# Patient Record
Sex: Female | Born: 1960
Health system: Southern US, Community
[De-identification: ages and names within clinical notes are randomized; demographics above are authoritative.]

## PROBLEM LIST (undated history)

## (undated) DIAGNOSIS — R079 Chest pain, unspecified: Secondary | ICD-10-CM

## (undated) DIAGNOSIS — I2489 Other forms of acute ischemic heart disease: Secondary | ICD-10-CM

## (undated) DIAGNOSIS — T8149XA Infection following a procedure, other surgical site, initial encounter: Secondary | ICD-10-CM

## (undated) DIAGNOSIS — E1165 Type 2 diabetes mellitus with hyperglycemia: Secondary | ICD-10-CM

## (undated) DIAGNOSIS — I209 Angina pectoris, unspecified: Secondary | ICD-10-CM

## (undated) DIAGNOSIS — F17200 Nicotine dependence, unspecified, uncomplicated: Secondary | ICD-10-CM

## (undated) DIAGNOSIS — R000 Tachycardia, unspecified: Secondary | ICD-10-CM

## (undated) DIAGNOSIS — I10 Essential (primary) hypertension: Secondary | ICD-10-CM

## (undated) DIAGNOSIS — E875 Hyperkalemia: Secondary | ICD-10-CM

## (undated) DIAGNOSIS — I219 Acute myocardial infarction, unspecified: Secondary | ICD-10-CM

## (undated) DIAGNOSIS — Z8614 Personal history of Methicillin resistant Staphylococcus aureus infection: Secondary | ICD-10-CM

## (undated) DIAGNOSIS — I739 Peripheral vascular disease, unspecified: Secondary | ICD-10-CM

## (undated) DIAGNOSIS — I248 Other forms of acute ischemic heart disease: Secondary | ICD-10-CM

## (undated) DIAGNOSIS — Z95810 Presence of automatic (implantable) cardiac defibrillator: Secondary | ICD-10-CM

## (undated) DIAGNOSIS — E785 Hyperlipidemia, unspecified: Secondary | ICD-10-CM

## (undated) DIAGNOSIS — Z951 Presence of aortocoronary bypass graft: Secondary | ICD-10-CM

## (undated) DIAGNOSIS — R6520 Severe sepsis without septic shock: Secondary | ICD-10-CM

## (undated) DIAGNOSIS — C801 Malignant (primary) neoplasm, unspecified: Secondary | ICD-10-CM

## (undated) DIAGNOSIS — A419 Sepsis, unspecified organism: Secondary | ICD-10-CM

## (undated) DIAGNOSIS — J439 Emphysema, unspecified: Secondary | ICD-10-CM

## (undated) DIAGNOSIS — R142 Eructation: Secondary | ICD-10-CM

## (undated) DIAGNOSIS — J9851 Mediastinitis: Secondary | ICD-10-CM

## (undated) DIAGNOSIS — I5032 Chronic diastolic (congestive) heart failure: Secondary | ICD-10-CM

## (undated) DIAGNOSIS — M21172 Varus deformity, not elsewhere classified, left ankle: Secondary | ICD-10-CM

## (undated) DIAGNOSIS — I25119 Atherosclerotic heart disease of native coronary artery with unspecified angina pectoris: Secondary | ICD-10-CM

## (undated) DIAGNOSIS — K219 Gastro-esophageal reflux disease without esophagitis: Secondary | ICD-10-CM

## (undated) HISTORY — DX: Severe sepsis without septic shock: R65.20

## (undated) HISTORY — DX: Nicotine dependence, unspecified, uncomplicated: F17.200

## (undated) HISTORY — DX: Morbid (severe) obesity due to excess calories: E66.01

## (undated) HISTORY — DX: Chronic diastolic (congestive) heart failure: I50.32

## (undated) HISTORY — PX: TUBAL LIGATION: SHX77

## (undated) HISTORY — PX: BLADDER SURGERY: SHX569

## (undated) HISTORY — DX: Mediastinitis: J98.51

## (undated) HISTORY — DX: Hyperlipidemia, unspecified: E78.5

## (undated) HISTORY — DX: Type 2 diabetes mellitus with hyperglycemia: E11.65

## (undated) HISTORY — DX: Other forms of acute ischemic heart disease: I24.8

## (undated) HISTORY — DX: Presence of aortocoronary bypass graft: Z95.1

## (undated) HISTORY — DX: Sepsis, unspecified organism: A41.9

## (undated) HISTORY — PX: CORONARY ARTERY BYPASS GRAFT: SHX141

## (undated) HISTORY — DX: Chest pain, unspecified: R07.9

## (undated) HISTORY — DX: Other forms of acute ischemic heart disease: I24.89

## (undated) HISTORY — DX: Tachycardia, unspecified: R00.0

## (undated) HISTORY — DX: Atherosclerotic heart disease of native coronary artery with unspecified angina pectoris: I25.119

## (undated) HISTORY — DX: Gastro-esophageal reflux disease without esophagitis: K21.9

## (undated) HISTORY — PX: CARDIAC CATHETERIZATION: SHX172

## (undated) HISTORY — DX: Emphysema, unspecified: J43.9

## (undated) HISTORY — DX: Eructation: R14.2

## (undated) HISTORY — DX: Essential (primary) hypertension: I10

## (undated) HISTORY — DX: Infection following a procedure, other surgical site, initial encounter: T81.49XA

## (undated) HISTORY — DX: Angina pectoris, unspecified: I20.9

---

## 2006-02-19 ENCOUNTER — Inpatient Hospital Stay (HOSPITAL_COMMUNITY): Admission: EM | Admit: 2006-02-19 | Discharge: 2006-02-21 | Payer: Self-pay | Admitting: Cardiology

## 2006-02-19 ENCOUNTER — Ambulatory Visit: Payer: Self-pay | Admitting: Cardiology

## 2015-09-12 DIAGNOSIS — I25119 Atherosclerotic heart disease of native coronary artery with unspecified angina pectoris: Secondary | ICD-10-CM | POA: Insufficient documentation

## 2015-09-12 DIAGNOSIS — E785 Hyperlipidemia, unspecified: Secondary | ICD-10-CM

## 2015-09-12 DIAGNOSIS — I10 Essential (primary) hypertension: Secondary | ICD-10-CM | POA: Insufficient documentation

## 2015-09-12 HISTORY — DX: Hyperlipidemia, unspecified: E78.5

## 2015-09-12 HISTORY — DX: Atherosclerotic heart disease of native coronary artery with unspecified angina pectoris: I25.119

## 2015-09-12 HISTORY — DX: Essential (primary) hypertension: I10

## 2015-12-06 LAB — HM COLONOSCOPY

## 2016-04-20 DIAGNOSIS — F17200 Nicotine dependence, unspecified, uncomplicated: Secondary | ICD-10-CM | POA: Insufficient documentation

## 2016-04-20 HISTORY — DX: Nicotine dependence, unspecified, uncomplicated: F17.200

## 2016-05-08 DIAGNOSIS — R142 Eructation: Secondary | ICD-10-CM | POA: Insufficient documentation

## 2016-05-08 DIAGNOSIS — L97509 Non-pressure chronic ulcer of other part of unspecified foot with unspecified severity: Secondary | ICD-10-CM | POA: Insufficient documentation

## 2016-05-08 DIAGNOSIS — I739 Peripheral vascular disease, unspecified: Secondary | ICD-10-CM | POA: Insufficient documentation

## 2016-05-08 DIAGNOSIS — K219 Gastro-esophageal reflux disease without esophagitis: Secondary | ICD-10-CM

## 2016-05-08 DIAGNOSIS — R079 Chest pain, unspecified: Secondary | ICD-10-CM

## 2016-05-08 DIAGNOSIS — E11621 Type 2 diabetes mellitus with foot ulcer: Secondary | ICD-10-CM | POA: Insufficient documentation

## 2016-05-08 DIAGNOSIS — R000 Tachycardia, unspecified: Secondary | ICD-10-CM | POA: Insufficient documentation

## 2016-05-08 DIAGNOSIS — E1165 Type 2 diabetes mellitus with hyperglycemia: Secondary | ICD-10-CM

## 2016-05-08 DIAGNOSIS — IMO0002 Reserved for concepts with insufficient information to code with codable children: Secondary | ICD-10-CM

## 2016-05-08 HISTORY — DX: Tachycardia, unspecified: R00.0

## 2016-05-08 HISTORY — DX: Type 2 diabetes mellitus with foot ulcer: E11.621

## 2016-05-08 HISTORY — DX: Type 2 diabetes mellitus with hyperglycemia: E11.65

## 2016-05-08 HISTORY — DX: Morbid (severe) obesity due to excess calories: E66.01

## 2016-05-08 HISTORY — DX: Gastro-esophageal reflux disease without esophagitis: K21.9

## 2016-05-08 HISTORY — DX: Eructation: R14.2

## 2016-05-08 HISTORY — DX: Reserved for concepts with insufficient information to code with codable children: IMO0002

## 2016-05-08 HISTORY — DX: Chest pain, unspecified: R07.9

## 2016-06-03 DIAGNOSIS — E1110 Type 2 diabetes mellitus with ketoacidosis without coma: Secondary | ICD-10-CM | POA: Insufficient documentation

## 2016-06-03 DIAGNOSIS — A419 Sepsis, unspecified organism: Secondary | ICD-10-CM

## 2016-06-03 DIAGNOSIS — Z951 Presence of aortocoronary bypass graft: Secondary | ICD-10-CM | POA: Insufficient documentation

## 2016-06-03 DIAGNOSIS — R6520 Severe sepsis without septic shock: Secondary | ICD-10-CM

## 2016-06-03 HISTORY — DX: Presence of aortocoronary bypass graft: Z95.1

## 2016-06-03 HISTORY — DX: Sepsis, unspecified organism: R65.20

## 2016-06-03 HISTORY — DX: Severe sepsis without septic shock: A41.9

## 2016-06-06 DIAGNOSIS — T8149XA Infection following a procedure, other surgical site, initial encounter: Secondary | ICD-10-CM | POA: Insufficient documentation

## 2016-06-06 HISTORY — DX: Infection following a procedure, other surgical site, initial encounter: T81.49XA

## 2016-06-28 DIAGNOSIS — J9851 Mediastinitis: Secondary | ICD-10-CM

## 2016-06-28 HISTORY — DX: Mediastinitis: J98.51

## 2016-11-06 DIAGNOSIS — I5032 Chronic diastolic (congestive) heart failure: Secondary | ICD-10-CM

## 2016-11-06 HISTORY — DX: Chronic diastolic (congestive) heart failure: I50.32

## 2016-12-24 DIAGNOSIS — M5186 Other intervertebral disc disorders, lumbar region: Secondary | ICD-10-CM | POA: Diagnosis not present

## 2016-12-24 DIAGNOSIS — M25551 Pain in right hip: Secondary | ICD-10-CM | POA: Diagnosis not present

## 2016-12-24 DIAGNOSIS — M47816 Spondylosis without myelopathy or radiculopathy, lumbar region: Secondary | ICD-10-CM | POA: Diagnosis not present

## 2017-02-13 DIAGNOSIS — I5032 Chronic diastolic (congestive) heart failure: Secondary | ICD-10-CM | POA: Diagnosis not present

## 2017-02-13 DIAGNOSIS — I25119 Atherosclerotic heart disease of native coronary artery with unspecified angina pectoris: Secondary | ICD-10-CM | POA: Diagnosis not present

## 2017-02-13 DIAGNOSIS — Z951 Presence of aortocoronary bypass graft: Secondary | ICD-10-CM | POA: Diagnosis not present

## 2017-05-07 DIAGNOSIS — Z8619 Personal history of other infectious and parasitic diseases: Secondary | ICD-10-CM | POA: Diagnosis not present

## 2017-05-07 DIAGNOSIS — Z09 Encounter for follow-up examination after completed treatment for conditions other than malignant neoplasm: Secondary | ICD-10-CM | POA: Diagnosis not present

## 2017-05-07 DIAGNOSIS — T8132XD Disruption of internal operation (surgical) wound, not elsewhere classified, subsequent encounter: Secondary | ICD-10-CM | POA: Diagnosis not present

## 2017-05-07 DIAGNOSIS — R918 Other nonspecific abnormal finding of lung field: Secondary | ICD-10-CM | POA: Diagnosis not present

## 2017-05-07 DIAGNOSIS — Z9889 Other specified postprocedural states: Secondary | ICD-10-CM | POA: Diagnosis not present

## 2017-09-11 DIAGNOSIS — I209 Angina pectoris, unspecified: Secondary | ICD-10-CM | POA: Insufficient documentation

## 2017-09-11 DIAGNOSIS — J439 Emphysema, unspecified: Secondary | ICD-10-CM

## 2017-09-11 HISTORY — DX: Emphysema, unspecified: J43.9

## 2017-09-11 HISTORY — DX: Angina pectoris, unspecified: I20.9

## 2017-09-16 ENCOUNTER — Ambulatory Visit (INDEPENDENT_AMBULATORY_CARE_PROVIDER_SITE_OTHER): Payer: Commercial Managed Care - PPO | Admitting: Cardiology

## 2017-09-16 ENCOUNTER — Encounter: Payer: Self-pay | Admitting: Cardiology

## 2017-09-16 VITALS — BP 120/70 | HR 82 | Ht 67.0 in | Wt 260.0 lb

## 2017-09-16 DIAGNOSIS — I25119 Atherosclerotic heart disease of native coronary artery with unspecified angina pectoris: Secondary | ICD-10-CM | POA: Diagnosis not present

## 2017-09-16 DIAGNOSIS — I10 Essential (primary) hypertension: Secondary | ICD-10-CM

## 2017-09-16 DIAGNOSIS — E785 Hyperlipidemia, unspecified: Secondary | ICD-10-CM

## 2017-09-16 DIAGNOSIS — Z122 Encounter for screening for malignant neoplasm of respiratory organs: Secondary | ICD-10-CM | POA: Diagnosis not present

## 2017-09-16 DIAGNOSIS — I5032 Chronic diastolic (congestive) heart failure: Secondary | ICD-10-CM | POA: Diagnosis not present

## 2017-09-16 NOTE — Progress Notes (Signed)
Cardiology Office Note:    Date:  09/16/2017   ID:  Jasmine Richardson, DOB Jul 14, 1961, MRN [id number]  PCP:  Ernestene Kiel, MD  Cardiologist:  Shirlee More, MD    Referring MD: No ref. provider found    ASSESSMENT:    1. Coronary artery disease involving native coronary artery of native heart with angina pectoris ([place])   2. Essential hypertension   3. Chronic diastolic heart failure ([place])   4. Hyperlipidemia, unspecified hyperlipidemia type    PLAN:    In order of problems listed above:  1. Stable continue medical treatment her shortness of breath is due to a combination of heart failure and restrictive lung disease from reoperation and failure sternal union. She tells me she is under a 30 pound weight restriction from  Cardiac surgery. 2. Stable continue current medical treatment 3. Compensated continue her current diuretic, she has no edema 4. Stable continue high intensity statin and access liver and lipids requested from her PCP  I ordered a CT scan screening for lung cancer as recommended by the cardiothoracic surgeon Next appointment: 6 months   Medication Adjustments/Labs and Tests Ordered: Current medicines are reviewed at length with the patient today.  Concerns regarding medicines are outlined above.  No orders of the defined types were placed in this encounter.  No orders of the defined types were placed in this encounter.   Chief Complaint  Patient presents with  . Follow-up    6 month flup appt   . Shortness of Breath    History of Present Illness:    Jasmine Richardson is a 56 y.o. female with a hx of CAD, S/P CABG 04/23/16 with post operative mediastinitis and reoperation at [facility] with a myocutaneous pectoral flaps for an infected sternotomy after CABG , diastolic heart failure, T2 DM, hypertension and hyperlipidemia last seen 02/13/17. Her last CTS note referenced:Follow up lung cancer imaging based on 2013 CHEST guidelines: CT chest annually He has never  fully recovered from bypass and continues to have intermittent variable shortness of breath at times with a relatively minor activities related to her chest wall abnormality. She also complains of  and sharp chest pain with a deep breath and has had no anginaedema palpitation or syncope. Compliance with diet, lifestyle and medications: yes Past Medical History:  Diagnosis Date  . Acute chest pain 05/08/2016  . Angina pectoris ([place]) 09/11/2017  . Burping 05/08/2016  . Chronic diastolic heart failure ([place]) 11/06/2016  . Coronary artery disease involving native coronary artery of native heart with angina pectoris ([place]) 09/12/2015   Overview:  PCI and stent of RCA 2009, last cath 2012 with medical therapy  CABG May 2017  . Emphysema lung ([place]) 09/11/2017  . Essential hypertension 09/12/2015  . GERD (gastroesophageal reflux disease) 05/08/2016  . Hyperlipidemia 09/12/2015  . Mediastinitis 06/28/2016  . Morbid obesity ([place]) 05/08/2016  . S/P CABG (coronary artery bypass graft) 06/03/2016   Overview:  The patient underwent sternal reconstruction on 06/21/16 with pec flaps for mediastinitis from a prior CABG in May 2017. On admission, she was critically ill from sepsis and had altered mental status. She was last seen in clinic on 08/09/16 at which time she was doing well.  . Severe sepsis ([place]) 06/03/2016  . Sinus tachycardia 05/08/2016  . Tobacco use disorder 04/20/2016   Overview:  Quit in May 2017.  [name] Uncontrolled type 2 diabetes mellitus ([place]) 05/08/2016  . Wound, surgical, infected 06/06/2016   Overview:  sternal  Past Surgical History:  Procedure Laterality Date  . BLADDER SURGERY    . CARDIAC CATHETERIZATION    . CORONARY ARTERY BYPASS GRAFT    . TUBAL LIGATION      Current Medications: Current Meds  Medication Sig  . aspirin 81 MG chewable tablet Chew 81 mg by mouth daily.  [name] atorvastatin (LIPITOR) 80 MG tablet Take 80 mg by mouth daily.  . Cyanocobalamin (VITAMIN B 12 PO) Take 1 tablet by mouth  daily.  [name] glipiZIDE (GLUCOTROL) 10 MG tablet Take 10 mg by mouth 2 (two) times daily.  . insulin regular (NOVOLIN R,HUMULIN R) 100 units/mL injection Inject into the skin. Sliding scale-only uses as needed  . LORazepam (ATIVAN) 1 MG tablet Take 1 mg by mouth daily as needed for anxiety.  . metFORMIN (GLUCOPHAGE) 1000 MG tablet Take 1,000 mg by mouth 2 (two) times daily.  . metoprolol succinate (TOPROL-XL) 50 MG 24 hr tablet Take 1 tablet by mouth 3 (three) times daily.  . nitroGLYCERIN (NITROSTAT) 0.4 MG SL tablet Place 0.4 mg under the tongue every 5 (five) minutes x 3 doses as needed for chest pain.  [name] omeprazole (PRILOSEC) 20 MG capsule Take 20 mg by mouth daily.  . ramipril (ALTACE) 1.25 MG capsule Take 1.25 mg by mouth daily.  . ranitidine (ZANTAC) 300 MG tablet Take 300 mg by mouth at bedtime.  . Vitamin D, Ergocalciferol, (DRISDOL) 50000 units CAPS capsule Take 50,000 Units by mouth every 7 (seven) days.     Allergies:   Codeine   Social History   Social History  . Marital status: Married    Spouse name: N/A  . Number of children: N/A  . Years of education: N/A   Social History Main Topics  . Smoking status: Former Research scientist (life sciences)  . Smokeless tobacco: Never Used  . Alcohol use No  . Drug use: No  . Sexual activity: Not Asked   Other Topics Concern  . None   Social History Narrative  . None     Family History: The patient's family history includes Alzheimer's disease in her father; Heart attack in her father; Heart disease in her father; Hyperlipidemia in her brother and mother; Hypertension in her brother, father, and mother. ROS:   Please see the history of present illness.    All other systems reviewed and are negative.  EKGs/Labs/Other Studies Reviewed:    The following studies were reviewed today:  EKG:  EKG ordered today.  The ekg ordered today demonstrates sinus rhythm old inferior septal MI  Recent Labs: No results found for requested labs within last 8760  hours.  Recent Lipid Panel No results found for: CHOL, TRIG, HDL, CHOLHDL, VLDL, LDLCALC, LDLDIRECT  Physical Exam:    VS:  BP 120/70 (BP Location: Right Arm, Patient Position: Sitting)   Pulse 82   Ht 5\' 7"  (1.702 m)   Wt 260 lb (117.9 kg)   SpO2 97%   BMI 40.72 kg/m     Wt Readings from Last 3 Encounters:  09/16/17 260 lb (117.9 kg)     GEN:  Well nourished, well developed in no acute distress HEENT: Normal NECK: No JVD; No carotid bruits LYMPHATICS: No lymphadenopathy CARDIAC: sternal click, RRR, no murmurs, rubs, gallops RESPIRATORY:  Clear to auscultation without rales, wheezing or rhonchi  ABDOMEN: Soft, non-tender, non-distended MUSCULOSKELETAL:  No edema; No deformity  SKIN: Warm and dry NEUROLOGIC:  Alert and oriented x 3 PSYCHIATRIC:  Normal affect    Signed, Shirlee More, MD  09/16/2017 2:51 PM    [facility]

## 2017-09-16 NOTE — Patient Instructions (Signed)
Medication Instructions:  Your physician recommends that you continue on your current medications as directed. Please refer to the Current Medication list given to you today.   Labwork: None  Testing/Procedures: You had an EKG today  Non-Cardiac CT scanning, (CAT scanning), is a noninvasive, special x-ray that produces cross-sectional images of the body using x-rays and a computer. CT scans help physicians diagnose and treat medical conditions. For some CT exams, a contrast material is used to enhance visibility in the area of the body being studied. CT scans provide greater clarity and reveal more details than regular x-ray exams.    Follow-Up: Your physician recommends that you schedule a follow-up appointment in: 6 months   Any Other Special Instructions Will Be Listed Below (If Applicable).     If you need a refill on your cardiac medications before your next appointment, please call your pharmacy.

## 2017-09-19 DIAGNOSIS — J181 Lobar pneumonia, unspecified organism: Secondary | ICD-10-CM | POA: Diagnosis not present

## 2017-09-20 ENCOUNTER — Telehealth: Payer: Self-pay

## 2017-09-20 NOTE — Telephone Encounter (Signed)
Pt notified of CT results per Dr [name].  Pt verbalized understanding,

## 2017-09-20 NOTE — Telephone Encounter (Signed)
-----   Message from Richardo Priest, MD sent at 09/19/2017  3:31 PM EDT ----- CT is good

## 2017-09-20 NOTE — Telephone Encounter (Signed)
Left message on pts home phone and cell phone to return call for CT results.

## 2017-09-20 NOTE — Telephone Encounter (Signed)
Patient returned your call.cn 

## 2017-09-27 DIAGNOSIS — E785 Hyperlipidemia, unspecified: Secondary | ICD-10-CM | POA: Diagnosis not present

## 2017-09-27 DIAGNOSIS — I10 Essential (primary) hypertension: Secondary | ICD-10-CM | POA: Diagnosis not present

## 2017-09-27 DIAGNOSIS — E1165 Type 2 diabetes mellitus with hyperglycemia: Secondary | ICD-10-CM | POA: Diagnosis not present

## 2017-09-27 DIAGNOSIS — Z79899 Other long term (current) drug therapy: Secondary | ICD-10-CM | POA: Diagnosis not present

## 2017-10-21 DIAGNOSIS — H1044 Vernal conjunctivitis: Secondary | ICD-10-CM | POA: Diagnosis not present

## 2017-10-21 DIAGNOSIS — H1032 Unspecified acute conjunctivitis, left eye: Secondary | ICD-10-CM | POA: Diagnosis not present

## 2017-12-03 DIAGNOSIS — H3563 Retinal hemorrhage, bilateral: Secondary | ICD-10-CM | POA: Diagnosis not present

## 2017-12-03 DIAGNOSIS — Z7984 Long term (current) use of oral hypoglycemic drugs: Secondary | ICD-10-CM | POA: Diagnosis not present

## 2017-12-03 DIAGNOSIS — E113391 Type 2 diabetes mellitus with moderate nonproliferative diabetic retinopathy without macular edema, right eye: Secondary | ICD-10-CM | POA: Diagnosis not present

## 2017-12-30 ENCOUNTER — Other Ambulatory Visit: Payer: Self-pay

## 2017-12-30 MED ORDER — ATORVASTATIN CALCIUM 80 MG PO TABS: 80.0000 mg | ORAL_TABLET | Freq: Every day | ORAL | 2 refills | Status: DC

## 2018-01-30 DIAGNOSIS — I10 Essential (primary) hypertension: Secondary | ICD-10-CM | POA: Diagnosis not present

## 2018-01-30 DIAGNOSIS — E1165 Type 2 diabetes mellitus with hyperglycemia: Secondary | ICD-10-CM | POA: Diagnosis not present

## 2018-01-30 DIAGNOSIS — Z79899 Other long term (current) drug therapy: Secondary | ICD-10-CM | POA: Diagnosis not present

## 2018-01-30 DIAGNOSIS — E785 Hyperlipidemia, unspecified: Secondary | ICD-10-CM | POA: Diagnosis not present

## 2018-01-30 DIAGNOSIS — I2510 Atherosclerotic heart disease of native coronary artery without angina pectoris: Secondary | ICD-10-CM | POA: Diagnosis not present

## 2018-03-26 DIAGNOSIS — H00015 Hordeolum externum left lower eyelid: Secondary | ICD-10-CM | POA: Diagnosis not present

## 2018-05-02 DIAGNOSIS — R809 Proteinuria, unspecified: Secondary | ICD-10-CM | POA: Diagnosis not present

## 2018-05-02 DIAGNOSIS — I10 Essential (primary) hypertension: Secondary | ICD-10-CM | POA: Diagnosis not present

## 2018-05-02 DIAGNOSIS — E1165 Type 2 diabetes mellitus with hyperglycemia: Secondary | ICD-10-CM | POA: Diagnosis not present

## 2018-05-02 DIAGNOSIS — Z79899 Other long term (current) drug therapy: Secondary | ICD-10-CM | POA: Diagnosis not present

## 2018-06-25 ENCOUNTER — Ambulatory Visit: Payer: Commercial Managed Care - PPO | Admitting: Cardiology

## 2018-06-27 ENCOUNTER — Ambulatory Visit: Payer: Commercial Managed Care - PPO | Admitting: Cardiology

## 2018-06-27 ENCOUNTER — Encounter: Payer: Self-pay | Admitting: Cardiology

## 2018-06-27 VITALS — BP 128/76 | HR 87 | Ht 67.0 in | Wt 261.0 lb

## 2018-06-27 DIAGNOSIS — R296 Repeated falls: Secondary | ICD-10-CM

## 2018-06-27 DIAGNOSIS — I5032 Chronic diastolic (congestive) heart failure: Secondary | ICD-10-CM | POA: Diagnosis not present

## 2018-06-27 DIAGNOSIS — E785 Hyperlipidemia, unspecified: Secondary | ICD-10-CM

## 2018-06-27 DIAGNOSIS — I25119 Atherosclerotic heart disease of native coronary artery with unspecified angina pectoris: Secondary | ICD-10-CM

## 2018-06-27 DIAGNOSIS — I209 Angina pectoris, unspecified: Secondary | ICD-10-CM | POA: Diagnosis not present

## 2018-06-27 DIAGNOSIS — I10 Essential (primary) hypertension: Secondary | ICD-10-CM

## 2018-06-27 MED ORDER — FUROSEMIDE 40 MG PO TABS: 40.0000 mg | ORAL_TABLET | Freq: Every day | ORAL | 3 refills | Status: DC

## 2018-06-27 NOTE — Patient Instructions (Signed)
Medication Instructions:  Your physician has recommended you make the following change in your medication:   START: furosemide 40mg  one tablet daily   Labwork: Your physician recommends that you have the following labs drawn on Tuesday: BMP and BNP   Testing/Procedures: NONE  Follow-Up: Your physician recommends that you schedule a follow-up appointment in: 2 weeks   Any Other Special Instructions Will Be Listed Below (If Applicable).     If you need a refill on your cardiac medications before your next appointment, please call your pharmacy.

## 2018-06-27 NOTE — Progress Notes (Signed)
Cardiology Office Note:    Date:  06/27/2018   ID:  Jasmine Richardson, DOB September 07, 1961, MRN [id number]  PCP:  Ernestene Kiel, MD  Cardiologist:  Shirlee More, MD    Referring MD: Ernestene Kiel, MD    ASSESSMENT:    1. Chronic diastolic heart failure ([place])   2. Coronary artery disease involving native coronary artery of native heart with angina pectoris ([place])   3. Angina pectoris ([place])   4. Hyperlipidemia, unspecified hyperlipidemia type   5. Essential hypertension   6. Falls frequently    PLAN:    In order of problems listed above:  1. She is symptomatic fluid overloaded and decompensated heart failure.  Labs requested recently from her PCP office start a loop diuretic can come back Tuesday and check BMP and proBNP and follow-up in 2 weeks.  I will make a decision at that time regarding the need for echocardiogram 2. Stable after bypass surgery continue current medical treatment 3. Stable no clinical recurrence 4. Stable continue her statin await labs for liver function safety lipids efficacy 5. Stable at target no orthostatic shift and blood pressure taking beta-blocker and ACE with her diabetes 6. Unrelated to blood pressure I suspect it is a combination including diabetic neuropathy and asked her to address with her primary care physician   Next appointment: 2 weeks   Medication Adjustments/Labs and Tests Ordered: Current medicines are reviewed at length with the patient today.  Concerns regarding medicines are outlined above.  Orders Placed This Encounter  Procedures  . Basic metabolic panel  . Pro b natriuretic peptide (BNP)  . EKG 12-Lead   Meds ordered this encounter  Medications  . furosemide (LASIX) 40 MG tablet    Sig: Take 1 tablet (40 mg total) by mouth daily.    Dispense:  30 tablet    Refill:  3    No chief complaint on file.   History of Present Illness:    Jasmine Richardson is a 57 y.o. female with a hx of CAD, S/P CABG 04/23/16 with post  operative mediastinitis and reoperation at [facility] with a myocutaneous pectoral flaps for an infected sternotomy after CABG , diastolic heart failure, T2 DM, hypertension and hyperlipidemia   last seen 09/16/17. Compliance with diet, lifestyle and medications: Yes  She is no longer loop diuretic and complains of edema exertional shortness of breath with minor activities and orthopnea. She has had no chest pain palpitation or syncope Her predominant concern is frequent falls she has no orthostatic shift and blood pressure my office today. Recent labs requested from her PCP office renal function hemoglobin and lipids Past Medical History:  Diagnosis Date  . Acute chest pain 05/08/2016  . Angina pectoris ([place]) 09/11/2017  . Burping 05/08/2016  . Chronic diastolic heart failure ([place]) 11/06/2016  . Coronary artery disease involving native coronary artery of native heart with angina pectoris ([place]) 09/12/2015   Overview:  PCI and stent of RCA 2009, last cath 2012 with medical therapy  CABG May 2017  . Emphysema lung ([place]) 09/11/2017  . Essential hypertension 09/12/2015  . GERD (gastroesophageal reflux disease) 05/08/2016  . Hyperlipidemia 09/12/2015  . Mediastinitis 06/28/2016  . Morbid obesity ([place]) 05/08/2016  . S/P CABG (coronary artery bypass graft) 06/03/2016   Overview:  The patient underwent sternal reconstruction on 06/21/16 with pec flaps for mediastinitis from a prior CABG in May 2017. On admission, she was critically ill from sepsis and had altered mental status. She was last seen in clinic  on 08/09/16 at which time she was doing well.  . Severe sepsis ([place]) 06/03/2016  . Sinus tachycardia 05/08/2016  . Tobacco use disorder 04/20/2016   Overview:  Quit in May 2017.  [name] Uncontrolled type 2 diabetes mellitus ([place]) 05/08/2016  . Wound, surgical, infected 06/06/2016   Overview:  sternal    Past Surgical History:  Procedure Laterality Date  . BLADDER SURGERY    . CARDIAC CATHETERIZATION    . CORONARY  ARTERY BYPASS GRAFT    . TUBAL LIGATION      Current Medications: Current Meds  Medication Sig  . aspirin 81 MG chewable tablet Chew 81 mg by mouth daily.  [name] atorvastatin (LIPITOR) 80 MG tablet Take 1 tablet (80 mg total) by mouth daily.  . Cyanocobalamin (VITAMIN B 12 PO) Take 1 tablet by mouth daily.  [name] glipiZIDE (GLUCOTROL) 10 MG tablet Take 10 mg by mouth 2 (two) times daily.  . insulin regular (NOVOLIN R,HUMULIN R) 100 units/mL injection Inject into the skin. Sliding scale-only uses as needed  . metFORMIN (GLUCOPHAGE) 1000 MG tablet Take 1,000 mg by mouth 2 (two) times daily.  . metoprolol succinate (TOPROL-XL) 50 MG 24 hr tablet Take 1 tablet by mouth 3 (three) times daily.  . nitroGLYCERIN (NITROSTAT) 0.4 MG SL tablet Place 0.4 mg under the tongue every 5 (five) minutes x 3 doses as needed for chest pain.  [name] omeprazole (PRILOSEC) 20 MG capsule Take 20 mg by mouth daily.  . ramipril (ALTACE) 1.25 MG capsule Take 1.25 mg by mouth daily.  . ranitidine (ZANTAC) 300 MG tablet Take 300 mg by mouth at bedtime.  . Vitamin D, Ergocalciferol, (DRISDOL) 50000 units CAPS capsule Take 50,000 Units by mouth every 7 (seven) days.     Allergies:   Codeine   Social History   Socioeconomic History  . Marital status: Married    Spouse name: Not on file  . Number of children: Not on file  . Years of education: Not on file  . Highest education level: Not on file  Occupational History  . Not on file  Social Needs  . Financial resource strain: Not on file  . Food insecurity:    Worry: Not on file    Inability: Not on file  . Transportation needs:    Medical: Not on file    Non-medical: Not on file  Tobacco Use  . Smoking status: Former Research scientist (life sciences)  . Smokeless tobacco: Never Used  Substance and Sexual Activity  . Alcohol use: No  . Drug use: No  . Sexual activity: Not on file  Lifestyle  . Physical activity:    Days per week: Not on file    Minutes per session: Not on file  . Stress:  Not on file  Relationships  . Social connections:    Talks on phone: Not on file    Gets together: Not on file    Attends religious service: Not on file    Active member of club or organization: Not on file    Attends meetings of clubs or organizations: Not on file    Relationship status: Not on file  Other Topics Concern  . Not on file  Social History Narrative  . Not on file     Family History: The patient's family history includes Alzheimer's disease in her father; Heart attack in her father; Heart disease in her father; Hyperlipidemia in her brother and mother; Hypertension in her brother, father, and mother. ROS:   Please see  the history of present illness.    All other systems reviewed and are negative.  EKGs/Labs/Other Studies Reviewed:    The following studies were reviewed today:  EKG:  EKG ordered today.  The ekg ordered today demonstrates sinus rhythm old anterior septal MI incomplete right bundle branch block  Recent Labs: No results found for requested labs within last 8760 hours.  Recent Lipid Panel No results found for: CHOL, TRIG, HDL, CHOLHDL, VLDL, LDLCALC, LDLDIRECT  Physical Exam:    VS:  BP 128/76 (BP Location: Right Arm, Patient Position: Sitting, Cuff Size: Normal)   Pulse 87   Ht 5\' 7"  (1.702 m)   Wt 261 lb (118.4 kg)   SpO2 98%   BMI 40.88 kg/m     Wt Readings from Last 3 Encounters:  06/27/18 261 lb (118.4 kg)  09/16/17 260 lb (117.9 kg)     GEN: Unsteady with transfers and gait no orthostatic shift and blood pressure on my exam well nourished, well developed in no acute distress HEENT: Normal NECK: No JVD; No carotid bruits LYMPHATICS: No lymphadenopathy CARDIAC: RRR, no murmurs, rubs, gallops RESPIRATORY:  Clear to auscultation without rales, wheezing or rhonchi  ABDOMEN: Soft, non-tender, non-distended MUSCULOSKELETAL: 2-3+ to the knee bilateral edema; No deformity  SKIN: Warm and dry NEUROLOGIC:  Alert and oriented x  3 PSYCHIATRIC:  Normal affect    Signed, Shirlee More, MD  06/27/2018 4:55 PM     [facility]

## 2018-06-30 ENCOUNTER — Other Ambulatory Visit: Payer: Self-pay | Admitting: Cardiology

## 2018-06-30 DIAGNOSIS — I209 Angina pectoris, unspecified: Secondary | ICD-10-CM | POA: Diagnosis not present

## 2018-06-30 DIAGNOSIS — I25119 Atherosclerotic heart disease of native coronary artery with unspecified angina pectoris: Secondary | ICD-10-CM | POA: Diagnosis not present

## 2018-06-30 DIAGNOSIS — I5032 Chronic diastolic (congestive) heart failure: Secondary | ICD-10-CM | POA: Diagnosis not present

## 2018-06-30 LAB — BASIC METABOLIC PANEL
BUN/Creatinine Ratio: 19 (ref 9–23)
BUN: 17 mg/dL (ref 6–24)
CO2: 19 mmol/L — ABNORMAL LOW (ref 20–29)
Calcium: 10 mg/dL (ref 8.7–10.2)
Chloride: 93 mmol/L — ABNORMAL LOW (ref 96–106)
Creatinine, Ser: 0.9 mg/dL (ref 0.57–1.00)
GFR calc Af Amer: 82 mL/min/{1.73_m2} (ref >59)
GFR calc non Af Amer: 71 mL/min/{1.73_m2} (ref >59)
Glucose: 401 mg/dL — ABNORMAL HIGH (ref 65–99)
Potassium: 4.8 mmol/L (ref 3.5–5.2)
Sodium: 132 mmol/L — ABNORMAL LOW (ref 134–144)

## 2018-06-30 LAB — PRO B NATRIURETIC PEPTIDE: NT-Pro BNP: 148 pg/mL (ref 0–287)

## 2018-07-13 NOTE — Progress Notes (Deleted)
Cardiology Office Note:    Date:  07/13/2018   ID:  Jasmine Richardson, DOB 1961-07-13, MRN [id number]  PCP:  Ernestene Kiel, MD  Cardiologist:  Shirlee More, MD    Referring MD: Ernestene Kiel, MD    ASSESSMENT:    No diagnosis found. PLAN:    In order of problems listed above:  1. ***   Next appointment: ***   Medication Adjustments/Labs and Tests Ordered: Current medicines are reviewed at length with the patient today.  Concerns regarding medicines are outlined above.  No orders of the defined types were placed in this encounter.  No orders of the defined types were placed in this encounter.   No chief complaint on file.   History of Present Illness:    Jasmine Richardson is a 57 y.o. female with a hx of CAD, S/P CABG 04/23/16 with post operative mediastinitis and reoperation at [facility] with a myocutaneous pectoral flaps for an infected sternotomy after CABG , diastolic heart failure, T2 DM, hypertension and hyperlipidemia   CAD, S/P CABG 04/23/16 with post operative mediastinitis and reoperation at [facility] with a myocutaneous pectoral flaps for an infected sternotomy after CABG , diastolic heart failure, T2 DM, hypertension and hyperlipidemia  last seen 06/27/18 with decompensated heart failure.. Compliance with diet, lifestyle and medications: *** Past Medical History:  Diagnosis Date  . Acute chest pain 05/08/2016  . Angina pectoris ([place]) 09/11/2017  . Burping 05/08/2016  . Chronic diastolic heart failure ([place]) 11/06/2016  . Coronary artery disease involving native coronary artery of native heart with angina pectoris ([place]) 09/12/2015   Overview:  PCI and stent of RCA 2009, last cath 2012 with medical therapy  CABG May 2017  . Emphysema lung ([place]) 09/11/2017  . Essential hypertension 09/12/2015  . GERD (gastroesophageal reflux disease) 05/08/2016  . Hyperlipidemia 09/12/2015  . Mediastinitis 06/28/2016  . Morbid obesity ([place]) 05/08/2016  . S/P CABG (coronary artery bypass  graft) 06/03/2016   Overview:  The patient underwent sternal reconstruction on 06/21/16 with pec flaps for mediastinitis from a prior CABG in May 2017. On admission, she was critically ill from sepsis and had altered mental status. She was last seen in clinic on 08/09/16 at which time she was doing well.  . Severe sepsis ([place]) 06/03/2016  . Sinus tachycardia 05/08/2016  . Tobacco use disorder 04/20/2016   Overview:  Quit in May 2017.  [name] Uncontrolled type 2 diabetes mellitus ([place]) 05/08/2016  . Wound, surgical, infected 06/06/2016   Overview:  sternal    Past Surgical History:  Procedure Laterality Date  . BLADDER SURGERY    . CARDIAC CATHETERIZATION    . CORONARY ARTERY BYPASS GRAFT    . TUBAL LIGATION      Current Medications: No outpatient medications have been marked as taking for the 07/14/18 encounter (Appointment) with Richardo Priest, MD.     Allergies:   Codeine   Social History   Socioeconomic History  . Marital status: Married    Spouse name: Not on file  . Number of children: Not on file  . Years of education: Not on file  . Highest education level: Not on file  Occupational History  . Not on file  Social Needs  . Financial resource strain: Not on file  . Food insecurity:    Worry: Not on file    Inability: Not on file  . Transportation needs:    Medical: Not on file    Non-medical: Not on file  Tobacco Use  .  Smoking status: Former Research scientist (life sciences)  . Smokeless tobacco: Never Used  Substance and Sexual Activity  . Alcohol use: No  . Drug use: No  . Sexual activity: Not on file  Lifestyle  . Physical activity:    Days per week: Not on file    Minutes per session: Not on file  . Stress: Not on file  Relationships  . Social connections:    Talks on phone: Not on file    Gets together: Not on file    Attends religious service: Not on file    Active member of club or organization: Not on file    Attends meetings of clubs or organizations: Not on file    Relationship  status: Not on file  Other Topics Concern  . Not on file  Social History Narrative  . Not on file     Family History: The patient's ***family history includes Alzheimer's disease in her father; Heart attack in her father; Heart disease in her father; Hyperlipidemia in her brother and mother; Hypertension in her brother, father, and mother. ROS:   Please see the history of present illness.    All other systems reviewed and are negative.  EKGs/Labs/Other Studies Reviewed:    The following studies were reviewed today:  EKG:  EKG ordered today.  The ekg ordered today demonstrates ***  Recent Labs: 06/30/2018: BUN 17; Creatinine, Ser 0.90; NT-Pro BNP 148; Potassium 4.8; Sodium 132  Recent Lipid Panel No results found for: CHOL, TRIG, HDL, CHOLHDL, VLDL, LDLCALC, LDLDIRECT  Physical Exam:    VS:  There were no vitals taken for this visit.    Wt Readings from Last 3 Encounters:  06/27/18 261 lb (118.4 kg)  09/16/17 260 lb (117.9 kg)     GEN: *** Well nourished, well developed in no acute distress HEENT: Normal NECK: No JVD; No carotid bruits LYMPHATICS: No lymphadenopathy CARDIAC: ***RRR, no murmurs, rubs, gallops RESPIRATORY:  Clear to auscultation without rales, wheezing or rhonchi  ABDOMEN: Soft, non-tender, non-distended MUSCULOSKELETAL:  No edema; No deformity  SKIN: Warm and dry NEUROLOGIC:  Alert and oriented x 3 PSYCHIATRIC:  Normal affect    Signed, Shirlee More, MD  07/13/2018 12:37 PM    [place]

## 2018-07-14 ENCOUNTER — Ambulatory Visit: Payer: Commercial Managed Care - PPO | Admitting: Cardiology

## 2018-10-27 ENCOUNTER — Other Ambulatory Visit: Payer: Self-pay | Admitting: Cardiology

## 2018-11-11 ENCOUNTER — Other Ambulatory Visit: Payer: Self-pay | Admitting: Cardiology

## 2018-11-28 DIAGNOSIS — Z79899 Other long term (current) drug therapy: Secondary | ICD-10-CM | POA: Diagnosis not present

## 2018-11-28 DIAGNOSIS — D509 Iron deficiency anemia, unspecified: Secondary | ICD-10-CM | POA: Diagnosis not present

## 2018-11-28 DIAGNOSIS — I10 Essential (primary) hypertension: Secondary | ICD-10-CM | POA: Diagnosis not present

## 2018-11-28 DIAGNOSIS — E1165 Type 2 diabetes mellitus with hyperglycemia: Secondary | ICD-10-CM | POA: Diagnosis not present

## 2018-11-28 DIAGNOSIS — E785 Hyperlipidemia, unspecified: Secondary | ICD-10-CM | POA: Diagnosis not present

## 2019-04-01 DIAGNOSIS — Z79899 Other long term (current) drug therapy: Secondary | ICD-10-CM | POA: Diagnosis not present

## 2019-04-01 DIAGNOSIS — E1165 Type 2 diabetes mellitus with hyperglycemia: Secondary | ICD-10-CM | POA: Diagnosis not present

## 2019-04-01 DIAGNOSIS — E785 Hyperlipidemia, unspecified: Secondary | ICD-10-CM | POA: Diagnosis not present

## 2019-04-06 DIAGNOSIS — E781 Pure hyperglyceridemia: Secondary | ICD-10-CM | POA: Diagnosis not present

## 2019-04-06 DIAGNOSIS — E1165 Type 2 diabetes mellitus with hyperglycemia: Secondary | ICD-10-CM | POA: Diagnosis not present

## 2019-05-28 ENCOUNTER — Other Ambulatory Visit: Payer: Self-pay | Admitting: Cardiology

## 2019-05-29 ENCOUNTER — Other Ambulatory Visit: Payer: Self-pay | Admitting: *Deleted

## 2019-06-23 ENCOUNTER — Other Ambulatory Visit: Payer: Self-pay | Admitting: Cardiology

## 2019-06-23 ENCOUNTER — Other Ambulatory Visit: Payer: Self-pay

## 2019-06-23 MED ORDER — FUROSEMIDE 40 MG PO TABS: 40.0000 mg | ORAL_TABLET | Freq: Every day | ORAL | 0 refills | Status: DC

## 2019-06-23 NOTE — Telephone Encounter (Signed)
°*  STAT* If patient is at the pharmacy, call can be transferred to refill team. ° ° °1. Which medications need to be refilled? (please list name of each medication and dose if known) furosemide (LASIX) 40 MG tablet  ° °2. Which pharmacy/location (including street and city if local pharmacy) is medication to be sent to?  °[facility] - [place], [place] - [address] [phone] (Phone) °[phone] (Fax)  ° ° °3. Do they need a 30 day or 90 day supply? 90 day ° °

## 2019-07-02 ENCOUNTER — Ambulatory Visit: Payer: Commercial Managed Care - PPO | Admitting: Cardiology

## 2019-07-02 ENCOUNTER — Telehealth: Payer: Self-pay | Admitting: *Deleted

## 2019-07-02 NOTE — Telephone Encounter (Signed)
Fax from [facility] about pt getting blood work. Pt wanted blood work done when she sees Dr. [name] so she isn't getting double blood work from [facility]. Per Ernestene Kiel pt needs a HgbA1C, CMP, and lipid panel done. Please have these done when pt comes in August.

## 2019-07-20 ENCOUNTER — Encounter: Payer: Self-pay | Admitting: Cardiology

## 2019-07-20 ENCOUNTER — Ambulatory Visit (INDEPENDENT_AMBULATORY_CARE_PROVIDER_SITE_OTHER): Payer: Commercial Managed Care - PPO | Admitting: Cardiology

## 2019-07-20 ENCOUNTER — Other Ambulatory Visit: Payer: Self-pay

## 2019-07-20 VITALS — BP 140/76 | HR 75 | Ht 67.0 in | Wt 243.0 lb

## 2019-07-20 DIAGNOSIS — E785 Hyperlipidemia, unspecified: Secondary | ICD-10-CM | POA: Diagnosis not present

## 2019-07-20 DIAGNOSIS — I10 Essential (primary) hypertension: Secondary | ICD-10-CM

## 2019-07-20 DIAGNOSIS — E1110 Type 2 diabetes mellitus with ketoacidosis without coma: Secondary | ICD-10-CM

## 2019-07-20 DIAGNOSIS — I5032 Chronic diastolic (congestive) heart failure: Secondary | ICD-10-CM

## 2019-07-20 MED ORDER — PRAVASTATIN SODIUM 20 MG PO TABS: 20.0000 mg | ORAL_TABLET | Freq: Every evening | ORAL | 1 refills | Status: DC

## 2019-07-20 MED ORDER — NITROGLYCERIN 0.4 MG SL SUBL: 0.4000 mg | SUBLINGUAL_TABLET | SUBLINGUAL | 11 refills | Status: DC | PRN

## 2019-07-20 NOTE — Patient Instructions (Signed)
Medication Instructions:  Your physician has recommended you make the following change in your medication:   START: Pravastatin 20 mg: Take 1 tab with evening meal.  If you need a refill on your cardiac medications before your next appointment, please call your pharmacy.   Lab work: Your physician recommends that you return for lab work in: 1 MONTH LIPID(fasting),CMP,Pro BNP  If you have labs (blood work) drawn today and your tests are completely normal, you will receive your results only by: [name] MyChart Message (if you have MyChart) OR . A paper copy in the mail If you have any lab test that is abnormal or we need to change your treatment, we will call you to review the results.  Testing/Procedures: None  Follow-Up: At [facility], you and your health needs are our priority.  As part of our continuing mission to provide you with exceptional heart care, we have created designated Provider Care Teams.  These Care Teams include your primary Cardiologist (physician) and Advanced Practice Providers (APPs -  Physician Assistants and Nurse Practitioners) who all work together to provide you with the care you need, when you need it. You will need a follow up appointment in 6 months with Dr. [name] Any Other Special Instructions Will Be Listed Below (If Applicable).

## 2019-07-20 NOTE — Progress Notes (Signed)
Cardiology Office Note:    Date:  07/20/2019   ID:  [name], DOB 06/20/1961, MRN [id number]  PCP:  Ernestene Kiel, MD  Cardiologist:  Shirlee More, MD    Referring MD: Ernestene Kiel, MD    ASSESSMENT:    1. Chronic diastolic heart failure ([place])   2. Hyperlipidemia, unspecified hyperlipidemia type   3. Essential hypertension   4. Diabetic ketoacidosis without coma associated with type 2 diabetes mellitus ([place])    PLAN:    In order of problems listed above:  1. Heart failure chronic diastolic compensated continue her current loop diuretic 2. Poorly controlled try a low dose low intensity statin and likely requires coincident treatment with PCSK9 check lipids 1 month also at that time check CMP proBNP 3. Stable BP at target continue current treatment including beta-blocker with CAD and ACE inhibitor with her diabetes 4. Poorly controlled diabetes managed by her PCP   Next appointment: 6 months   Medication Adjustments/Labs and Tests Ordered: Current medicines are reviewed at length with the patient today.  Concerns regarding medicines are outlined above.  Orders Placed This Encounter  Procedures  . Lipid Profile  . EKG 12-Lead   Meds ordered this encounter  Medications  . nitroGLYCERIN (NITROSTAT) 0.4 MG SL tablet    Sig: Place 1 tablet (0.4 mg total) under the tongue every 5 (five) minutes x 3 doses as needed for chest pain.    Dispense:  30 tablet    Refill:  11  . pravastatin (PRAVACHOL) 20 MG tablet    Sig: Take 1 tablet (20 mg total) by mouth every evening.    Dispense:  90 tablet    Refill:  1    Chief Complaint  Patient presents with  . Follow-up  . Coronary Artery Disease  . Congestive Heart Failure  . Diabetes Mellitus  . Hypertension  . Hyperlipidemia    History of Present Illness:    Jasmine Richardson is a 58 y.o. female with a hx of CAD, S/P CABG 04/23/16 with post operative mediastinitis and reoperation at [facility] with a myocutaneous  pectoral flaps for an infected sternotomy after CABG , diastolic heart failure, T2 DM, hypertension and hyperlipidemia  last seen 06/27/2018. Compliance with diet, lifestyle and medications: Yes  Overall she is not doing well she is still waiting for disability finds her self very anxious and has gait dysfunction and has fallen when she is outdoors.  She continues to have nonexertional chest pain more to the right in the middle of her sternum sharp and relieved with rest.  Her EKG in my office today shows sinus rhythm old anterior septal MI no acute ischemic changes.  Is been off lipid-lowering therapy and could not tolerate a atorvastatin or rosuvastatin will try low-dose pravastatin recheck lipids in 1 month and may require PCSK9 treatment.  No edema shortness of breath palpitation or syncope Past Medical History:  Diagnosis Date  . Acute chest pain 05/08/2016  . Angina pectoris ([place]) 09/11/2017  . Burping 05/08/2016  . Chronic diastolic heart failure ([place]) 11/06/2016  . Coronary artery disease involving native coronary artery of native heart with angina pectoris ([place]) 09/12/2015   Overview:  PCI and stent of RCA 2009, last cath 2012 with medical therapy  CABG May 2017  . Emphysema lung ([place]) 09/11/2017  . Essential hypertension 09/12/2015  . GERD (gastroesophageal reflux disease) 05/08/2016  . Hyperlipidemia 09/12/2015  . Mediastinitis 06/28/2016  . Morbid obesity ([place]) 05/08/2016  . S/P CABG (coronary artery  bypass graft) 06/03/2016   Overview:  The patient underwent sternal reconstruction on 06/21/16 with pec flaps for mediastinitis from a prior CABG in May 2017. On admission, she was critically ill from sepsis and had altered mental status. She was last seen in clinic on 08/09/16 at which time she was doing well.  . Severe sepsis ([place]) 06/03/2016  . Sinus tachycardia 05/08/2016  . Tobacco use disorder 04/20/2016   Overview:  Quit in May 2017.  [name] Uncontrolled type 2 diabetes mellitus ([place]) 05/08/2016  .  Wound, surgical, infected 06/06/2016   Overview:  sternal    Past Surgical History:  Procedure Laterality Date  . BLADDER SURGERY    . CARDIAC CATHETERIZATION    . CORONARY ARTERY BYPASS GRAFT    . TUBAL LIGATION      Current Medications: Current Meds  Medication Sig  . aspirin 81 MG chewable tablet Chew 81 mg by mouth daily.  . Cyanocobalamin (VITAMIN B 12 PO) Take 1 tablet by mouth daily.  . furosemide (LASIX) 40 MG tablet Take 1 tablet (40 mg total) by mouth daily.  [name] glipiZIDE (GLUCOTROL) 10 MG tablet Take 10 mg by mouth 2 (two) times daily.  . insulin regular (NOVOLIN R,HUMULIN R) 100 units/mL injection Inject into the skin. Sliding scale-only uses as needed  . metFORMIN (GLUCOPHAGE) 1000 MG tablet Take 1,000 mg by mouth 2 (two) times daily.  . metoprolol succinate (TOPROL-XL) 50 MG 24 hr tablet Take 1 tablet by mouth 3 (three) times daily.  . nitroGLYCERIN (NITROSTAT) 0.4 MG SL tablet Place 1 tablet (0.4 mg total) under the tongue every 5 (five) minutes x 3 doses as needed for chest pain.  [name] omeprazole (PRILOSEC) 20 MG capsule Take 20 mg by mouth daily.  . ramipril (ALTACE) 1.25 MG capsule Take 1.25 mg by mouth daily.  . ranitidine (ZANTAC) 300 MG tablet Take 300 mg by mouth at bedtime.  . Vitamin D, Ergocalciferol, (DRISDOL) 50000 units CAPS capsule Take 50,000 Units by mouth every 7 (seven) days.  . [DISCONTINUED] nitroGLYCERIN (NITROSTAT) 0.4 MG SL tablet Place 0.4 mg under the tongue every 5 (five) minutes x 3 doses as needed for chest pain.     Allergies:   Codeine   Social History   Socioeconomic History  . Marital status: Married    Spouse name: Not on file  . Number of children: Not on file  . Years of education: Not on file  . Highest education level: Not on file  Occupational History  . Not on file  Social Needs  . Financial resource strain: Not on file  . Food insecurity    Worry: Not on file    Inability: Not on file  . Transportation needs    Medical:  Not on file    Non-medical: Not on file  Tobacco Use  . Smoking status: Former Research scientist (life sciences)  . Smokeless tobacco: Never Used  Substance and Sexual Activity  . Alcohol use: No  . Drug use: No  . Sexual activity: Not on file  Lifestyle  . Physical activity    Days per week: Not on file    Minutes per session: Not on file  . Stress: Not on file  Relationships  . Social Herbalist on phone: Not on file    Gets together: Not on file    Attends religious service: Not on file    Active member of club or organization: Not on file    Attends meetings of clubs  or organizations: Not on file    Relationship status: Not on file  Other Topics Concern  . Not on file  Social History Narrative  . Not on file     Family History: The patient's family history includes Alzheimer's disease in her father; Heart attack in her father; Heart disease in her father; Hyperlipidemia in her brother and mother; Hypertension in her brother, father, and mother. ROS:   Please see the history of present illness.    All other systems reviewed and are negative.  EKGs/Labs/Other Studies Reviewed:    The following studies were reviewed today:  EKG:  EKG ordered today and personally reviewed.  The ekg ordered today demonstrates sinus rhythm old anterior septal MI no acute ischemic changes  Recent Labs: 04/01/2019 cholesterol 415 HDL 31 A1c 13.4 creatinine 0.94 potassium 4.8 liver function and TSH normal   Physical Exam:    VS:  BP 140/76 (BP Location: Right Arm, Patient Position: Sitting, Cuff Size: Normal)   Pulse 75   Ht 5\' 7"  (1.702 m)   Wt 243 lb (110.2 kg)   SpO2 97%   BMI 38.06 kg/m     Wt Readings from Last 3 Encounters:  07/20/19 243 lb (110.2 kg)  06/27/18 261 lb (118.4 kg)  09/16/17 260 lb (117.9 kg)     GEN:  Well nourished, well developed in no acute distress HEENT: Normal NECK: No JVD; No carotid bruits LYMPHATICS: No lymphadenopathy CARDIAC: RRR, no murmurs, rubs, gallops  RESPIRATORY:  Clear to auscultation without rales, wheezing or rhonchi  ABDOMEN: Soft, non-tender, non-distended MUSCULOSKELETAL:  No edema; No deformity  SKIN: Warm and dry NEUROLOGIC:  Alert and oriented x 3 PSYCHIATRIC:  Normal affect    Signed, Shirlee More, MD  07/20/2019 10:58 AM    [place]

## 2019-07-28 ENCOUNTER — Other Ambulatory Visit: Payer: Self-pay | Admitting: Cardiology

## 2020-01-22 DIAGNOSIS — I2510 Atherosclerotic heart disease of native coronary artery without angina pectoris: Secondary | ICD-10-CM

## 2020-01-22 DIAGNOSIS — I10 Essential (primary) hypertension: Secondary | ICD-10-CM

## 2020-01-22 DIAGNOSIS — E119 Type 2 diabetes mellitus without complications: Secondary | ICD-10-CM

## 2020-01-22 DIAGNOSIS — E669 Obesity, unspecified: Secondary | ICD-10-CM

## 2020-01-22 DIAGNOSIS — R079 Chest pain, unspecified: Secondary | ICD-10-CM | POA: Diagnosis not present

## 2020-01-23 DIAGNOSIS — I10 Essential (primary) hypertension: Secondary | ICD-10-CM | POA: Diagnosis not present

## 2020-01-23 DIAGNOSIS — E119 Type 2 diabetes mellitus without complications: Secondary | ICD-10-CM | POA: Diagnosis not present

## 2020-01-23 DIAGNOSIS — R079 Chest pain, unspecified: Secondary | ICD-10-CM | POA: Diagnosis not present

## 2020-02-12 ENCOUNTER — Ambulatory Visit (INDEPENDENT_AMBULATORY_CARE_PROVIDER_SITE_OTHER): Payer: Commercial Managed Care - PPO | Admitting: Cardiology

## 2020-02-12 ENCOUNTER — Encounter: Payer: Self-pay | Admitting: Cardiology

## 2020-02-12 ENCOUNTER — Other Ambulatory Visit: Payer: Self-pay

## 2020-02-12 VITALS — BP 140/78 | HR 95 | Temp 97.2°F | Ht 67.0 in | Wt 233.0 lb

## 2020-02-12 DIAGNOSIS — E785 Hyperlipidemia, unspecified: Secondary | ICD-10-CM

## 2020-02-12 DIAGNOSIS — F419 Anxiety disorder, unspecified: Secondary | ICD-10-CM

## 2020-02-12 DIAGNOSIS — I25119 Atherosclerotic heart disease of native coronary artery with unspecified angina pectoris: Secondary | ICD-10-CM

## 2020-02-12 DIAGNOSIS — I110 Hypertensive heart disease with heart failure: Secondary | ICD-10-CM

## 2020-02-12 DIAGNOSIS — I5032 Chronic diastolic (congestive) heart failure: Secondary | ICD-10-CM

## 2020-02-12 DIAGNOSIS — E13621 Other specified diabetes mellitus with foot ulcer: Secondary | ICD-10-CM

## 2020-02-12 DIAGNOSIS — L97512 Non-pressure chronic ulcer of other part of right foot with fat layer exposed: Secondary | ICD-10-CM

## 2020-02-12 MED ORDER — OMEPRAZOLE 20 MG PO CPDR: 20.0000 mg | DELAYED_RELEASE_CAPSULE | Freq: Every day | ORAL | 1 refills | Status: DC

## 2020-02-12 NOTE — Progress Notes (Signed)
Cardiology Office Note:    Date:  02/12/2020   ID:  Jasmine Richardson, DOB [birth year], MRN [id number]  PCP:  Jasmine Kiel, MD  Cardiologist:  Jasmine More, MD    Referring MD: Jasmine Kiel, MD    ASSESSMENT:    1. Coronary artery disease involving native coronary artery of native heart with angina pectoris ([place])   2. Chronic diastolic heart failure ([place])   3. Hypertensive heart disease with chronic diastolic congestive heart failure ([place])   4. Hyperlipidemia, unspecified hyperlipidemia type   5. Diabetic ulcer of toe of right foot associated with diabetes mellitus of other type, with fat layer exposed ([place])    PLAN:    In order of problems listed above:  1. She did not have acute coronary syndrome not having ongoing angina at this time I would advocate continue medical therapy and continuing ranolazine and at this time would not advise coronary angiography. 2. Compensated continue her current diuretic 3. BP at target continue current treatment 4. Continue statin reduced to 20 mg pravastatin daily poor tolerance 5. Pressing problem today is an obvious diabetic ulcer that is infected, trying to find a healthcare provider to refer her to to be seen today. 6. Anxiety is a major problem and I directed the patient and her son to her primary care physician   Next appointment: 3 months   Medication Adjustments/Labs and Tests Ordered: Current medicines are reviewed at length with the patient today.  Concerns regarding medicines are outlined above.  No orders of the defined types were placed in this encounter.  Meds ordered this encounter  Medications  . omeprazole (PRILOSEC) 20 MG capsule    Sig: Take 1 capsule (20 mg total) by mouth daily.    Dispense:  90 capsule    Refill:  1    Chief Complaint  Patient presents with  . Follow-up  . Coronary Artery Disease    History of Present Illness:    Jasmine Richardson is a 59 y.o. female with a hx of  CAD, S/P CABG 04/23/16  with post operative mediastinitis and reoperation at [facility] with a myocutaneous pectoral flaps for an infected sternotomy after CABG , diastolic heart failure, T2 DM, hypertension and hyperlipidemia last seen 07/20/2019.  Compliance with diet, lifestyle and medications: Yes  She was noted to [facility] 01/22/2020 and discharged the next day.  In that admission she underwent a myocardial perfusion study showed a small area of ischemia involving the anterior lateral wall.  She did not have acute coronary syndrome and was judged to be best treated medically and she is initiated on the below listed.  History of admission to the hospital include a hemoglobin A1c of 13.7 hemoglobin 11.9 creatinine 0.7 serial troponins undetectable proBNP level low at 175.  Official discharge summary reported her myocardial perfusion study has ejection fraction 55% hypokinesia fixed defect was present and possible mild peri-infarct ischemia.  Initial emergency room note says that she came to the hospital because of episodes of dizziness unsteady gait and concerned that she had a fever.  Dependently.  Her EKG 01/22/2020 that shows sinus rhythm right bundle branch block old inferior and anterior septal MI.  This turned into a very complicated visit.  I reviewed her history that she did not have acute coronary syndrome she has had no further angina and a myocardial perfusion study showed minimal ischemia advocated continue medical treatment.  I reviewed medications with her specifically clopidogrel not aspirin and corrected her dose of pravastatin  to 20 mg daily.  At the end of the visit she surprised me by taking her shoe off and show any blood on her foot and she has a deep diabetic ulcer right foot great toe but some effective.  Poorly controlled diabetes. Following traumatic arrangements for surgical evaluation today.  The foot is not ischemic.  She then asked me to call her son and his concern is her stress and anxiety  and tearful episodes he thinks is directly related to open heart surgery I told him that certainly her heart disease contributes and I think is the cause best dealt with by primary care physician and not the focus of attention for today.  She also requested refill on Prilosec with heartburn.  Past Medical History:  Diagnosis Date  . Acute chest pain 05/08/2016  . Angina pectoris ([place]) 09/11/2017  . Burping 05/08/2016  . Chronic diastolic heart failure ([place]) 11/06/2016  . Coronary artery disease involving native coronary artery of native heart with angina pectoris ([place]) 09/12/2015   Overview:  PCI and stent of RCA 2009, last cath 2012 with medical therapy  CABG May 2017  . Emphysema lung ([place]) 09/11/2017  . Essential hypertension 09/12/2015  . GERD (gastroesophageal reflux disease) 05/08/2016  . Hyperlipidemia 09/12/2015  . Mediastinitis 06/28/2016  . Morbid obesity ([place]) 05/08/2016  . S/P CABG (coronary artery bypass graft) 06/03/2016   Overview:  The patient underwent sternal reconstruction on 06/21/16 with pec flaps for mediastinitis from a prior CABG in May 2017. On admission, she was critically ill from sepsis and had altered mental status. She was last seen in clinic on 08/09/16 at which time she was doing well.  . Severe sepsis ([place]) 06/03/2016  . Sinus tachycardia 05/08/2016  . Tobacco use disorder 04/20/2016   Overview:  Quit in May 2017.  [name] Uncontrolled type 2 diabetes mellitus ([place]) 05/08/2016  . Wound, surgical, infected 06/06/2016   Overview:  sternal    Past Surgical History:  Procedure Laterality Date  . BLADDER SURGERY    . CARDIAC CATHETERIZATION    . CORONARY ARTERY BYPASS GRAFT    . TUBAL LIGATION      Current Medications: Current Meds  Medication Sig  . clopidogrel (PLAVIX) 75 MG tablet Take 75 mg by mouth daily.  . Cyanocobalamin (VITAMIN B 12 PO) Take 1 tablet by mouth daily.  . Ergocalciferol (VITAMIN D2 PO) SMARTSIG:1 Capsule(s) By Mouth Once a Week  . furosemide (LASIX)  40 MG tablet Take 1 tablet by mouth once daily  . glipiZIDE (GLUCOTROL) 10 MG tablet Take 10 mg by mouth 2 (two) times daily.  . Insulin Glargine (BASAGLAR KWIKPEN) 100 UNIT/ML SOPN SMARTSIG:10 Unit(s) SUB-Q Daily  . JANUVIA 100 MG tablet Take 100 mg by mouth daily.  [name] LORazepam (ATIVAN) 1 MG tablet Take 1 mg by mouth as needed for anxiety.  . metFORMIN (GLUCOPHAGE) 1000 MG tablet Take 1,000 mg by mouth 2 (two) times daily.  . metoprolol succinate (TOPROL-XL) 50 MG 24 hr tablet Take 1 tablet by mouth 3 (three) times daily.  [name] neomycin-polymyxin-dexameth (MAXITROL) 0.1 % OINT Place 1 application into the left eye as needed.  . nitroGLYCERIN (NITROSTAT) 0.4 MG SL tablet Place 1 tablet (0.4 mg total) under the tongue every 5 (five) minutes x 3 doses as needed for chest pain.  [name] omeprazole (PRILOSEC) 20 MG capsule Take 1 capsule (20 mg total) by mouth daily.  . pravastatin (PRAVACHOL) 20 MG tablet Take 1 tablet (20 mg total) by mouth every  evening.  . ramipril (ALTACE) 1.25 MG capsule Take 1.25 mg by mouth daily.  . ranolazine (RANEXA) 500 MG 12 hr tablet Take 500 mg by mouth 2 (two) times daily.  . vitamin C (ASCORBIC ACID) 250 MG tablet Take 250 mg by mouth daily.  . Vitamin D, Ergocalciferol, (DRISDOL) 50000 units CAPS capsule Take 50,000 Units by mouth every 7 (seven) days.  . [DISCONTINUED] omeprazole (PRILOSEC) 20 MG capsule Take 20 mg by mouth daily.     Allergies:   Codeine   Social History   Socioeconomic History  . Marital status: Married    Spouse name: Not on file  . Number of children: Not on file  . Years of education: Not on file  . Highest education level: Not on file  Occupational History  . Not on file  Tobacco Use  . Smoking status: Former Research scientist (life sciences)  . Smokeless tobacco: Never Used  Substance and Sexual Activity  . Alcohol use: No  . Drug use: No  . Sexual activity: Yes  Other Topics Concern  . Not on file  Social History Narrative  . Not on file   Social  Determinants of Health   Financial Resource Strain:   . Difficulty of Paying Living Expenses: Not on file  Food Insecurity:   . Worried About Charity fundraiser in the Last Year: Not on file  . Ran Out of Food in the Last Year: Not on file  Transportation Needs:   . Lack of Transportation (Medical): Not on file  . Lack of Transportation (Non-Medical): Not on file  Physical Activity:   . Days of Exercise per Week: Not on file  . Minutes of Exercise per Session: Not on file  Stress:   . Feeling of Stress : Not on file  Social Connections:   . Frequency of Communication with Friends and Family: Not on file  . Frequency of Social Gatherings with Friends and Family: Not on file  . Attends Religious Services: Not on file  . Active Member of Clubs or Organizations: Not on file  . Attends Archivist Meetings: Not on file  . Marital Status: Not on file     Family History: The patient's family history includes Alzheimer's disease in her father; Heart attack in her father; Heart disease in her father; Hyperlipidemia in her brother and mother; Hypertension in her brother, father, and mother. ROS:   Please see the history of present illness.    All other systems reviewed and are negative.  EKGs/Labs/Other Studies Reviewed:    The following studies were reviewed today:   Physical Exam:    VS:  BP 140/78   Pulse 95   Temp (!) 97.2 F (36.2 C)   Ht 5\' 7"  (1.702 m)   Wt 233 lb (105.7 kg)   LMP  (LMP Unknown)   SpO2 95%   BMI 36.49 kg/m     Wt Readings from Last 3 Encounters:  02/12/20 233 lb (105.7 kg)  07/20/19 243 lb (110.2 kg)  06/27/18 261 lb (118.4 kg)     GEN: Very tearful obese in no acute distress HEENT: Normal NECK: No JVD; No carotid bruits LYMPHATICS: No lymphadenopathy CARDIAC: RRR, no murmurs, rubs, gallops RESPIRATORY:  Clear to auscultation without rales, wheezing or rhonchi  ABDOMEN: Soft, non-tender, non-distended MUSCULOSKELETAL:  No edema; No  deformity  SKIN: Warm and dry NEUROLOGIC:  Alert and oriented x 3 PSYCHIATRIC:  Normal affect  Right foot great toe diabetic ulcer.  Deep  obviously infected and draining.  Signed, Jasmine More, MD  02/12/2020 10:27 AM    [facility]

## 2020-02-12 NOTE — Patient Instructions (Signed)
Medication Instructions:  Your physician recommends that you continue on your current medications as directed. Please refer to the Current Medication list given to you today.  *If you need a refill on your cardiac medications before your next appointment, please call your pharmacy*   Lab Work: None If you have labs (blood work) drawn today and your tests are completely normal, you will receive your results only by: [name] MyChart Message (if you have MyChart) OR . A paper copy in the mail If you have any lab test that is abnormal or we need to change your treatment, we will call you to review the results.   Testing/Procedures: None   Follow-Up: At [facility], you and your health needs are our priority.  As part of our continuing mission to provide you with exceptional heart care, we have created designated Provider Care Teams.  These Care Teams include your primary Cardiologist (physician) and Advanced Practice Providers (APPs -  Physician Assistants and Nurse Practitioners) who all work together to provide you with the care you need, when you need it.  We recommend signing up for the patient portal called "MyChart".  Sign up information is provided on this After Visit Summary.  MyChart is used to connect with patients for Virtual Visits (Telemedicine).  Patients are able to view lab/test results, encounter notes, upcoming appointments, etc.  Non-urgent messages can be sent to your provider as well.   To learn more about what you can do with MyChart, go to [email or web address].    Your next appointment:   3 month(s)  The format for your next appointment:   In Person  Provider:   Shirlee More, MD   Other Instructions Appointment made with Dr Amalia Hailey on Tuesday at 10:15AM- 02/16/2020 for diabetic ulcer.

## 2020-02-15 ENCOUNTER — Other Ambulatory Visit: Payer: Self-pay | Admitting: Cardiology

## 2020-02-16 ENCOUNTER — Inpatient Hospital Stay (HOSPITAL_COMMUNITY): Payer: Commercial Managed Care - PPO

## 2020-02-16 ENCOUNTER — Emergency Department (HOSPITAL_COMMUNITY): Payer: Commercial Managed Care - PPO

## 2020-02-16 ENCOUNTER — Encounter (HOSPITAL_COMMUNITY): Payer: Self-pay

## 2020-02-16 ENCOUNTER — Inpatient Hospital Stay (HOSPITAL_COMMUNITY)
Admission: EM | Admit: 2020-02-16 | Discharge: 2020-02-21 | DRG: 854 | Disposition: A | Discharge status: Home / Self Care | Payer: Commercial Managed Care - PPO | Source: Ambulatory Visit | Attending: Internal Medicine | Admitting: Internal Medicine

## 2020-02-16 ENCOUNTER — Telehealth: Payer: Self-pay | Admitting: *Deleted

## 2020-02-16 ENCOUNTER — Encounter (HOSPITAL_COMMUNITY): Payer: Self-pay | Admitting: Emergency Medicine

## 2020-02-16 ENCOUNTER — Other Ambulatory Visit: Payer: Self-pay

## 2020-02-16 ENCOUNTER — Emergency Department (HOSPITAL_COMMUNITY)
Admission: EM | Admit: 2020-02-16 | Discharge: 2020-02-16 | Discharge status: Absent without leave | Payer: Commercial Managed Care - PPO | Attending: Emergency Medicine | Admitting: Emergency Medicine

## 2020-02-16 DIAGNOSIS — E1165 Type 2 diabetes mellitus with hyperglycemia: Secondary | ICD-10-CM

## 2020-02-16 DIAGNOSIS — M60076 Infective myositis, right toe(s): Secondary | ICD-10-CM | POA: Diagnosis present

## 2020-02-16 DIAGNOSIS — K219 Gastro-esophageal reflux disease without esophagitis: Secondary | ICD-10-CM | POA: Diagnosis present

## 2020-02-16 DIAGNOSIS — Z794 Long term (current) use of insulin: Secondary | ICD-10-CM

## 2020-02-16 DIAGNOSIS — E1151 Type 2 diabetes mellitus with diabetic peripheral angiopathy without gangrene: Secondary | ICD-10-CM | POA: Diagnosis present

## 2020-02-16 DIAGNOSIS — E11621 Type 2 diabetes mellitus with foot ulcer: Secondary | ICD-10-CM | POA: Diagnosis present

## 2020-02-16 DIAGNOSIS — Z7902 Long term (current) use of antithrombotics/antiplatelets: Secondary | ICD-10-CM | POA: Diagnosis not present

## 2020-02-16 DIAGNOSIS — B951 Streptococcus, group B, as the cause of diseases classified elsewhere: Secondary | ICD-10-CM | POA: Diagnosis present

## 2020-02-16 DIAGNOSIS — Z955 Presence of coronary angioplasty implant and graft: Secondary | ICD-10-CM

## 2020-02-16 DIAGNOSIS — Z20822 Contact with and (suspected) exposure to covid-19: Secondary | ICD-10-CM | POA: Diagnosis present

## 2020-02-16 DIAGNOSIS — Z87891 Personal history of nicotine dependence: Secondary | ICD-10-CM | POA: Diagnosis not present

## 2020-02-16 DIAGNOSIS — M86171 Other acute osteomyelitis, right ankle and foot: Secondary | ICD-10-CM | POA: Diagnosis present

## 2020-02-16 DIAGNOSIS — Z6836 Body mass index (BMI) 36.0-36.9, adult: Secondary | ICD-10-CM | POA: Diagnosis not present

## 2020-02-16 DIAGNOSIS — Z885 Allergy status to narcotic agent status: Secondary | ICD-10-CM

## 2020-02-16 DIAGNOSIS — Z8349 Family history of other endocrine, nutritional and metabolic diseases: Secondary | ICD-10-CM

## 2020-02-16 DIAGNOSIS — I110 Hypertensive heart disease with heart failure: Secondary | ICD-10-CM | POA: Diagnosis present

## 2020-02-16 DIAGNOSIS — I5032 Chronic diastolic (congestive) heart failure: Secondary | ICD-10-CM | POA: Diagnosis present

## 2020-02-16 DIAGNOSIS — I10 Essential (primary) hypertension: Secondary | ICD-10-CM | POA: Diagnosis present

## 2020-02-16 DIAGNOSIS — L03031 Cellulitis of right toe: Secondary | ICD-10-CM | POA: Diagnosis present

## 2020-02-16 DIAGNOSIS — IMO0002 Reserved for concepts with insufficient information to code with codable children: Secondary | ICD-10-CM | POA: Diagnosis present

## 2020-02-16 DIAGNOSIS — Z79899 Other long term (current) drug therapy: Secondary | ICD-10-CM

## 2020-02-16 DIAGNOSIS — L97509 Non-pressure chronic ulcer of other part of unspecified foot with unspecified severity: Secondary | ICD-10-CM

## 2020-02-16 DIAGNOSIS — A419 Sepsis, unspecified organism: Secondary | ICD-10-CM | POA: Diagnosis present

## 2020-02-16 DIAGNOSIS — L02611 Cutaneous abscess of right foot: Secondary | ICD-10-CM

## 2020-02-16 DIAGNOSIS — J439 Emphysema, unspecified: Secondary | ICD-10-CM | POA: Diagnosis present

## 2020-02-16 DIAGNOSIS — E1169 Type 2 diabetes mellitus with other specified complication: Secondary | ICD-10-CM | POA: Diagnosis present

## 2020-02-16 DIAGNOSIS — L97519 Non-pressure chronic ulcer of other part of right foot with unspecified severity: Secondary | ICD-10-CM | POA: Diagnosis present

## 2020-02-16 DIAGNOSIS — I739 Peripheral vascular disease, unspecified: Secondary | ICD-10-CM | POA: Diagnosis not present

## 2020-02-16 DIAGNOSIS — Z820 Family history of epilepsy and other diseases of the nervous system: Secondary | ICD-10-CM

## 2020-02-16 DIAGNOSIS — F419 Anxiety disorder, unspecified: Secondary | ICD-10-CM | POA: Diagnosis present

## 2020-02-16 DIAGNOSIS — E785 Hyperlipidemia, unspecified: Secondary | ICD-10-CM | POA: Diagnosis present

## 2020-02-16 DIAGNOSIS — L03116 Cellulitis of left lower limb: Secondary | ICD-10-CM | POA: Diagnosis present

## 2020-02-16 DIAGNOSIS — Z8249 Family history of ischemic heart disease and other diseases of the circulatory system: Secondary | ICD-10-CM

## 2020-02-16 DIAGNOSIS — I25119 Atherosclerotic heart disease of native coronary artery with unspecified angina pectoris: Secondary | ICD-10-CM | POA: Diagnosis present

## 2020-02-16 DIAGNOSIS — L0390 Cellulitis, unspecified: Secondary | ICD-10-CM | POA: Diagnosis not present

## 2020-02-16 DIAGNOSIS — L02416 Cutaneous abscess of left lower limb: Secondary | ICD-10-CM | POA: Diagnosis present

## 2020-02-16 DIAGNOSIS — Z951 Presence of aortocoronary bypass graft: Secondary | ICD-10-CM

## 2020-02-16 HISTORY — DX: Type 2 diabetes mellitus with foot ulcer: E11.621

## 2020-02-16 LAB — GLUCOSE, CAPILLARY: Glucose-Capillary: 364 mg/dL — ABNORMAL HIGH (ref 70–99)

## 2020-02-16 LAB — URINALYSIS, ROUTINE W REFLEX MICROSCOPIC
Bacteria, UA: NONE SEEN
Bilirubin Urine: NEGATIVE
Glucose, UA: >=500 mg/dL — AB
Hgb urine dipstick: NEGATIVE
Ketones, ur: NEGATIVE mg/dL
Leukocytes,Ua: NEGATIVE
Nitrite: NEGATIVE
Protein, ur: 30 mg/dL — AB
Specific Gravity, Urine: 1.032 — ABNORMAL HIGH (ref 1.005–1.030)
pH: 6 (ref 5.0–8.0)

## 2020-02-16 LAB — COMPREHENSIVE METABOLIC PANEL
ALT: 11 U/L (ref 0–44)
AST: 12 U/L — ABNORMAL LOW (ref 15–41)
Albumin: 3.1 g/dL — ABNORMAL LOW (ref 3.5–5.0)
Alkaline Phosphatase: 62 U/L (ref 38–126)
Anion gap: 14 (ref 5–15)
BUN: 8 mg/dL (ref 6–20)
CO2: 25 mmol/L (ref 22–32)
Calcium: 9.5 mg/dL (ref 8.9–10.3)
Chloride: 91 mmol/L — ABNORMAL LOW (ref 98–111)
Creatinine, Ser: 0.83 mg/dL (ref 0.44–1.00)
GFR calc Af Amer: >60 mL/min (ref >60)
GFR calc non Af Amer: >60 mL/min (ref >60)
Glucose, Bld: 578 mg/dL (ref 70–99)
Potassium: 4.3 mmol/L (ref 3.5–5.1)
Sodium: 130 mmol/L — ABNORMAL LOW (ref 135–145)
Total Bilirubin: 0.7 mg/dL (ref 0.3–1.2)
Total Protein: 7.8 g/dL (ref 6.5–8.1)

## 2020-02-16 LAB — CBC WITH DIFFERENTIAL/PLATELET
Abs Immature Granulocytes: 0.18 10*3/uL — ABNORMAL HIGH (ref 0.00–0.07)
Basophils Absolute: 0 10*3/uL (ref 0.0–0.1)
Basophils Relative: 0 %
Eosinophils Absolute: 0 10*3/uL (ref 0.0–0.5)
Eosinophils Relative: 0 %
HCT: 34.8 % — ABNORMAL LOW (ref 36.0–46.0)
Hemoglobin: 11.1 g/dL — ABNORMAL LOW (ref 12.0–15.0)
Immature Granulocytes: 1 %
Lymphocytes Relative: 8 %
Lymphs Abs: 1.4 10*3/uL (ref 0.7–4.0)
MCH: 28.6 pg (ref 26.0–34.0)
MCHC: 31.9 g/dL (ref 30.0–36.0)
MCV: 89.7 fL (ref 80.0–100.0)
Monocytes Absolute: 1.3 10*3/uL — ABNORMAL HIGH (ref 0.1–1.0)
Monocytes Relative: 7 %
Neutro Abs: 15.5 10*3/uL — ABNORMAL HIGH (ref 1.7–7.7)
Neutrophils Relative %: 84 %
Platelets: 425 10*3/uL — ABNORMAL HIGH (ref 150–400)
RBC: 3.88 MIL/uL (ref 3.87–5.11)
RDW: 12.5 % (ref 11.5–15.5)
WBC: 18.5 10*3/uL — ABNORMAL HIGH (ref 4.0–10.5)
nRBC: 0 % (ref 0.0–0.2)

## 2020-02-16 LAB — PROTIME-INR
INR: 1.2 (ref 0.8–1.2)
Prothrombin Time: 15.3 seconds — ABNORMAL HIGH (ref 11.4–15.2)

## 2020-02-16 LAB — RESPIRATORY PANEL BY RT PCR (FLU A&B, COVID)
Influenza A by PCR: NEGATIVE
Influenza B by PCR: NEGATIVE
SARS Coronavirus 2 by RT PCR: NEGATIVE

## 2020-02-16 LAB — LACTIC ACID, PLASMA
Lactic Acid, Venous: 3.8 mmol/L (ref 0.5–1.9)
Lactic Acid, Venous: 2.4 mmol/L (ref 0.5–1.9)

## 2020-02-16 LAB — I-STAT BETA HCG BLOOD, ED (MC, WL, AP ONLY): I-stat hCG, quantitative: <5 m[IU]/mL (ref <5)

## 2020-02-16 LAB — HEMOGLOBIN A1C
Hgb A1c MFr Bld: 11.7 % — ABNORMAL HIGH (ref 4.8–5.6)
Mean Plasma Glucose: 289.09 mg/dL

## 2020-02-16 MED ORDER — SODIUM CHLORIDE 0.9 % IV BOLUS
1000.0000 mL | Freq: Once | INTRAVENOUS | Status: AC
  Administered 2020-02-16: 16:00:00 1000 mL via INTRAVENOUS

## 2020-02-16 MED ORDER — SODIUM CHLORIDE 0.9 % IV SOLN: 1.0000 g | INTRAVENOUS | Status: DC

## 2020-02-16 MED ORDER — PANTOPRAZOLE SODIUM 40 MG PO TBEC
40.0000 mg | DELAYED_RELEASE_TABLET | Freq: Every day | ORAL | Status: DC
  Administered 2020-02-17 – 2020-02-21 (×5): 40 mg via ORAL
  Filled 2020-02-16 (×5): qty 1

## 2020-02-16 MED ORDER — VANCOMYCIN HCL IN DEXTROSE 1-5 GM/200ML-% IV SOLN: 1000.0000 mg | Freq: Once | INTRAVENOUS | Status: DC

## 2020-02-16 MED ORDER — ACETAMINOPHEN 650 MG RE SUPP: 650.0000 mg | Freq: Four times a day (QID) | RECTAL | Status: DC | PRN

## 2020-02-16 MED ORDER — INSULIN ASPART 100 UNIT/ML ~~LOC~~ SOLN
0.0000 [IU] | Freq: Every day | SUBCUTANEOUS | Status: DC
  Administered 2020-02-16: 21:00:00 5 [IU] via SUBCUTANEOUS
  Administered 2020-02-17: 22:00:00 3 [IU] via SUBCUTANEOUS
  Administered 2020-02-18: 21:00:00 5 [IU] via SUBCUTANEOUS
  Administered 2020-02-19: 20:00:00 3 [IU] via SUBCUTANEOUS

## 2020-02-16 MED ORDER — VANCOMYCIN HCL 1500 MG/300ML IV SOLN
1500.0000 mg | INTRAVENOUS | Status: DC
  Filled 2020-02-16: qty 300

## 2020-02-16 MED ORDER — SODIUM CHLORIDE 0.9 % IV SOLN: 250.0000 mL | INTRAVENOUS | Status: DC | PRN

## 2020-02-16 MED ORDER — ONDANSETRON HCL 4 MG/2ML IJ SOLN
4.0000 mg | Freq: Four times a day (QID) | INTRAMUSCULAR | Status: DC | PRN
  Administered 2020-02-20: 23:00:00 4 mg via INTRAVENOUS
  Filled 2020-02-16: qty 2

## 2020-02-16 MED ORDER — ONDANSETRON HCL 4 MG PO TABS: 4.0000 mg | ORAL_TABLET | Freq: Four times a day (QID) | ORAL | Status: DC | PRN

## 2020-02-16 MED ORDER — ENOXAPARIN SODIUM 40 MG/0.4ML ~~LOC~~ SOLN
40.0000 mg | SUBCUTANEOUS | Status: DC
  Administered 2020-02-16: 21:00:00 40 mg via SUBCUTANEOUS
  Filled 2020-02-16: qty 0.4

## 2020-02-16 MED ORDER — CLOPIDOGREL BISULFATE 75 MG PO TABS
75.0000 mg | ORAL_TABLET | Freq: Every day | ORAL | Status: DC
  Administered 2020-02-17 – 2020-02-21 (×4): 75 mg via ORAL
  Filled 2020-02-16 (×4): qty 1

## 2020-02-16 MED ORDER — SODIUM CHLORIDE 0.9% FLUSH: 3.0000 mL | INTRAVENOUS | Status: DC | PRN

## 2020-02-16 MED ORDER — SODIUM CHLORIDE 0.9 % IV BOLUS: 1000.0000 mL | Freq: Once | INTRAVENOUS | Status: DC

## 2020-02-16 MED ORDER — FUROSEMIDE 40 MG PO TABS
40.0000 mg | ORAL_TABLET | Freq: Every day | ORAL | Status: DC
  Administered 2020-02-17 – 2020-02-21 (×5): 40 mg via ORAL
  Filled 2020-02-16 (×5): qty 1

## 2020-02-16 MED ORDER — SODIUM CHLORIDE 0.9 % IV SOLN
2.0000 g | Freq: Once | INTRAVENOUS | Status: AC
  Administered 2020-02-16: 16:00:00 2 g via INTRAVENOUS
  Filled 2020-02-16: qty 20

## 2020-02-16 MED ORDER — INSULIN ASPART 100 UNIT/ML ~~LOC~~ SOLN
0.0000 [IU] | Freq: Three times a day (TID) | SUBCUTANEOUS | Status: DC
  Administered 2020-02-17 – 2020-02-18 (×5): 15 [IU] via SUBCUTANEOUS
  Administered 2020-02-18: 18:00:00 7 [IU] via SUBCUTANEOUS
  Administered 2020-02-19 – 2020-02-21 (×7): 11 [IU] via SUBCUTANEOUS

## 2020-02-16 MED ORDER — VANCOMYCIN HCL 1500 MG/300ML IV SOLN
1500.0000 mg | INTRAVENOUS | Status: DC
  Administered 2020-02-16 – 2020-02-17 (×2): 1500 mg via INTRAVENOUS
  Filled 2020-02-16 (×2): qty 300

## 2020-02-16 MED ORDER — LORAZEPAM 1 MG PO TABS
1.0000 mg | ORAL_TABLET | ORAL | Status: DC | PRN
  Administered 2020-02-18 – 2020-02-20 (×2): 1 mg via ORAL
  Filled 2020-02-16 (×2): qty 1

## 2020-02-16 MED ORDER — METOPROLOL SUCCINATE ER 50 MG PO TB24
150.0000 mg | ORAL_TABLET | Freq: Every day | ORAL | Status: DC
  Administered 2020-02-17 – 2020-02-21 (×5): 150 mg via ORAL
  Filled 2020-02-16 (×5): qty 3

## 2020-02-16 MED ORDER — SODIUM CHLORIDE 0.9% FLUSH
3.0000 mL | Freq: Two times a day (BID) | INTRAVENOUS | Status: DC
  Administered 2020-02-16 – 2020-02-21 (×6): 3 mL via INTRAVENOUS

## 2020-02-16 MED ORDER — PRAVASTATIN SODIUM 10 MG PO TABS
20.0000 mg | ORAL_TABLET | Freq: Every evening | ORAL | Status: DC
  Administered 2020-02-17 – 2020-02-20 (×4): 20 mg via ORAL
  Filled 2020-02-16 (×4): qty 2

## 2020-02-16 MED ORDER — RANOLAZINE ER 500 MG PO TB12: 500.0000 mg | ORAL_TABLET | Freq: Two times a day (BID) | ORAL | Status: DC

## 2020-02-16 MED ORDER — ACETAMINOPHEN 325 MG PO TABS
650.0000 mg | ORAL_TABLET | Freq: Four times a day (QID) | ORAL | Status: DC | PRN
  Administered 2020-02-16 – 2020-02-21 (×5): 650 mg via ORAL
  Filled 2020-02-16 (×5): qty 2

## 2020-02-16 MED ORDER — SENNOSIDES-DOCUSATE SODIUM 8.6-50 MG PO TABS: 1.0000 | ORAL_TABLET | Freq: Every evening | ORAL | Status: DC | PRN

## 2020-02-16 MED ORDER — INSULIN GLARGINE 100 UNIT/ML ~~LOC~~ SOLN
10.0000 [IU] | Freq: Every day | SUBCUTANEOUS | Status: DC
  Administered 2020-02-16: 10 [IU] via SUBCUTANEOUS
  Filled 2020-02-16 (×3): qty 0.1

## 2020-02-16 MED ORDER — METRONIDAZOLE IN NACL 5-0.79 MG/ML-% IV SOLN
500.0000 mg | Freq: Three times a day (TID) | INTRAVENOUS | Status: DC
  Administered 2020-02-17 – 2020-02-21 (×12): 500 mg via INTRAVENOUS
  Filled 2020-02-16 (×13): qty 100

## 2020-02-16 MED ORDER — TRAMADOL HCL 50 MG PO TABS
50.0000 mg | ORAL_TABLET | Freq: Four times a day (QID) | ORAL | Status: DC | PRN
  Administered 2020-02-17 – 2020-02-19 (×3): 50 mg via ORAL
  Filled 2020-02-16 (×5): qty 1

## 2020-02-16 MED ORDER — RAMIPRIL 1.25 MG PO CAPS
1.2500 mg | ORAL_CAPSULE | Freq: Every day | ORAL | Status: DC
  Administered 2020-02-17 – 2020-02-21 (×5): 1.25 mg via ORAL
  Filled 2020-02-16 (×6): qty 1

## 2020-02-16 MED ORDER — IOHEXOL 300 MG/ML  SOLN
100.0000 mL | Freq: Once | INTRAMUSCULAR | Status: AC | PRN
  Administered 2020-02-16: 100 mL via INTRAVENOUS

## 2020-02-16 MED ORDER — ONDANSETRON HCL 4 MG/2ML IJ SOLN
4.0000 mg | Freq: Once | INTRAMUSCULAR | Status: AC
  Administered 2020-02-16: 16:00:00 4 mg via INTRAVENOUS
  Filled 2020-02-16: qty 2

## 2020-02-16 NOTE — ED Triage Notes (Signed)
States was sent to er for wound on rt toe that is infection x 2 weeks  and wound on back of rt leg that is draining  x1 week ,  Saw her dr today in [employer]

## 2020-02-16 NOTE — ED Notes (Signed)
Urine culture collected and sent to main lab. 

## 2020-02-16 NOTE — H&P (Signed)
History and Physical    Coda Misko [id number] DOB: 1961/08/07 DOA: 02/16/2020  PCP: Ernestene Kiel, MD  Patient coming from: Home  Chief Complaint: Right great toe pain and ulcer, fever  HPI: Jasmine Richardson is a 59 y.o. female with medical history significant of CAD, chronic diastolic HF, HTN, HLN, DM, who presents after being evaluated by Dr. Amalia Hailey, podiatry, in office.  She states that she first noticed her right great toe wound about 1 to 2 weeks ago.  She broke it open with her fingernail and started draining.  Also notes fever this week as well.  Denies any chest pain, shortness of breath.  Had some nausea but no vomiting, diarrhea or abdominal pain.  She also admits to a left upper thigh "cyst" that has been recurring over the past year, most recently about a week ago.  She has had intermittent I&D in the past.  ED Course: Labs obtained which reveals leukocytosis, lactic acidosis.  She was given IV fluids and IV antibiotics.  EDP spoke with [facility] for consultation for her diabetic foot wound.  Review of Systems: As per HPI. Otherwise, all other review of systems reviewed and are negative.   Past Medical History:  Diagnosis Date  . Acute chest pain 05/08/2016  . Angina pectoris ([place]) 09/11/2017  . [place] 05/08/2016  . Chronic diastolic heart failure ([place]) 11/06/2016  . Coronary artery disease involving native coronary artery of native heart with angina pectoris ([place]) 09/12/2015   Overview:  PCI and stent of RCA 2009, last cath 2012 with medical therapy  CABG May 2017  . Emphysema lung ([place]) 09/11/2017  . Essential hypertension 09/12/2015  . GERD (gastroesophageal reflux disease) 05/08/2016  . Hyperlipidemia 09/12/2015  . Mediastinitis 06/28/2016  . Morbid obesity ([place]) 05/08/2016  . S/P CABG (coronary artery bypass graft) 06/03/2016   Overview:  The patient underwent sternal reconstruction on 06/21/16 with pec flaps for mediastinitis from a prior CABG in May 2017. On  admission, she was critically ill from sepsis and had altered mental status. She was last seen in clinic on 08/09/16 at which time she was doing well.  . Severe sepsis ([place]) 06/03/2016  . Sinus tachycardia 05/08/2016  . Tobacco use disorder 04/20/2016   Overview:  Quit in May 2017.  [name] Uncontrolled type 2 diabetes mellitus ([place]) 05/08/2016  . Wound, surgical, infected 06/06/2016   Overview:  sternal    Past Surgical History:  Procedure Laterality Date  . BLADDER SURGERY    . CARDIAC CATHETERIZATION    . CORONARY ARTERY BYPASS GRAFT    . TUBAL LIGATION       reports that she has quit smoking. She has never used smokeless tobacco. She reports that she does not drink alcohol or use drugs.  Allergies  Allergen Reactions  . Codeine Rash    Family History  Problem Relation Age of Onset  . Hypertension Mother   . Hyperlipidemia Mother   . Heart attack Father   . Heart disease Father   . Hypertension Father   . Alzheimer's disease Father   . Hypertension Brother   . Hyperlipidemia Brother     Prior to Admission medications   Medication Sig Start Date End Date Taking? Authorizing Provider  clopidogrel (PLAVIX) 75 MG tablet Take 75 mg by mouth daily. 01/23/20   [provider]  Cyanocobalamin (VITAMIN B 12 PO) Take 1 tablet by mouth daily.    [provider]  Ergocalciferol (VITAMIN D2 PO) SMARTSIG:1 Capsule(s) By Mouth Once  a Week 08/25/19   [provider]  furosemide (LASIX) 40 MG tablet Take 1 tablet by mouth once daily 02/15/20   Richardo Priest, MD  glipiZIDE (GLUCOTROL) 10 MG tablet Take 10 mg by mouth 2 (two) times daily.    [provider]  Insulin Glargine (BASAGLAR KWIKPEN) 100 UNIT/ML SOPN SMARTSIG:10 Unit(s) SUB-Q Daily 01/24/20   [provider]  JANUVIA 100 MG tablet Take 100 mg by mouth daily. 02/05/20   [provider]  LORazepam (ATIVAN) 1 MG tablet Take 1 mg by mouth as needed for anxiety.    [provider]    metFORMIN (GLUCOPHAGE) 1000 MG tablet Take 1,000 mg by mouth 2 (two) times daily. 07/16/16   [provider]  metoprolol succinate (TOPROL-XL) 50 MG 24 hr tablet Take 1 tablet by mouth 3 (three) times daily. 10/18/16   [provider]  neomycin-polymyxin-dexameth (MAXITROL) 0.1 % OINT Place 1 application into the left eye as needed.    [provider]  nitroGLYCERIN (NITROSTAT) 0.4 MG SL tablet Place 1 tablet (0.4 mg total) under the tongue every 5 (five) minutes x 3 doses as needed for chest pain. 07/20/19   Richardo Priest, MD  omeprazole (PRILOSEC) 20 MG capsule Take 1 capsule (20 mg total) by mouth daily. 02/12/20   Richardo Priest, MD  pravastatin (PRAVACHOL) 20 MG tablet TAKE 1 TABLET BY MOUTH ONCE DAILY IN THE EVENING 02/15/20   Richardo Priest, MD  ramipril (ALTACE) 1.25 MG capsule Take 1.25 mg by mouth daily. 07/16/16   [provider]  ranolazine (RANEXA) 500 MG 12 hr tablet Take 500 mg by mouth 2 (two) times daily. 01/23/20   [provider]  vitamin C (ASCORBIC ACID) 250 MG tablet Take 250 mg by mouth daily.    [provider]  Vitamin D, Ergocalciferol, (DRISDOL) 50000 units CAPS capsule Take 50,000 Units by mouth every 7 (seven) days.    [provider]    Physical Exam: Vitals:   02/16/20 1313 02/16/20 1534  BP: (!) 159/86 138/62  Pulse: (!) 104 (!) 101  Resp: (!) 22 (!) 23  Temp: 99.5 F (37.5 C) (!) 100.4 F (38 C)  TempSrc: Oral Oral  SpO2: 99% 99%  Weight:  104.3 kg  Height:  5\' 7"  (1.702 m)      Constitutional: NAD, calm, comfortable Eyes: PERRL, lids and conjunctivae normal ENMT: Mucous membranes are moist. Normal dentition.  Respiratory: Clear to auscultation bilaterally, no wheezing, no crackles. Normal respiratory effort. No accessory muscle use. No conversational dyspnea  Cardiovascular: Tachycardic, regular rhythm, no murmurs. No extremity edema.  Abdomen: Soft, nondistended, nontender to palpation.  Bowel sounds positive.  Musculoskeletal: No joint deformity upper and lower extremities. No contractures. Normal muscle tone.  Skin:   Left upper medial thigh abscess present without drainage  Neurologic: Alert and oriented, speech fluent, CN 2-12 grossly intact. No focal deficits.   Psychiatric: Normal judgment and insight. Normal mood and affect   Labs on Admission: I have personally reviewed following labs and imaging studies  CBC: Recent Labs  Lab 02/16/20 1327  WBC 18.5*  NEUTROABS 15.5*  HGB 11.1*  HCT 34.8*  MCV 89.7  PLT [id number]*   Basic Metabolic Panel: Recent Labs  Lab 02/16/20 1327  NA 130*  K 4.3  CL 91*  CO2 25  GLUCOSE 578*  BUN 8  CREATININE 0.83  CALCIUM 9.5   GFR: Estimated Creatinine Clearance: 90.7 mL/min (by C-G formula based on  SCr of 0.83 mg/dL). Liver Function Tests: Recent Labs  Lab 02/16/20 1327  AST 12*  ALT 11  ALKPHOS 62  BILITOT 0.7  PROT 7.8  ALBUMIN 3.1*   No results for input(s): LIPASE, AMYLASE in the last 168 hours. No results for input(s): AMMONIA in the last 168 hours. Coagulation Profile: Recent Labs  Lab 02/16/20 1327  INR 1.2   Cardiac Enzymes: No results for input(s): CKTOTAL, CKMB, CKMBINDEX, TROPONINI in the last 168 hours. BNP (last 3 results) No results for input(s): PROBNP in the last 8760 hours. HbA1C: No results for input(s): HGBA1C in the last 72 hours. CBG: No results for input(s): GLUCAP in the last 168 hours. Lipid Profile: No results for input(s): CHOL, HDL, LDLCALC, TRIG, CHOLHDL, LDLDIRECT in the last 72 hours. Thyroid Function Tests: No results for input(s): TSH, T4TOTAL, FREET4, T3FREE, THYROIDAB in the last 72 hours. Anemia Panel: No results for input(s): VITAMINB12, FOLATE, FERRITIN, TIBC, IRON, RETICCTPCT in the last 72 hours. Urine analysis:    Component Value Date/Time   COLORURINE STRAW (A) 02/16/2020 1453   APPEARANCEUR CLEAR 02/16/2020 1453   LABSPEC 1.032 (H) 02/16/2020 1453    PHURINE 6.0 02/16/2020 1453   GLUCOSEU >=500 (A) 02/16/2020 1453   HGBUR NEGATIVE 02/16/2020 1453   BILIRUBINUR NEGATIVE 02/16/2020 1453   KETONESUR NEGATIVE 02/16/2020 1453   PROTEINUR 30 (A) 02/16/2020 1453   NITRITE NEGATIVE 02/16/2020 1453   LEUKOCYTESUR NEGATIVE 02/16/2020 1453   Sepsis Labs: !!!!!!!!!!!!!!!!!!!!!!!!!!!!!!!!!!!!!!!!!!!! @LABRCNTIP (procalcitonin:4,lacticidven:4) ) Recent Results (from the past 240 hour(s))  Culture, blood (Routine x 2)     Status: None (Preliminary result)   Collection Time: 02/16/20  2:04 PM   Specimen: BLOOD  Result Value Ref Range Status   Specimen Description BLOOD RIGHT ANTECUBITAL  Final   Special Requests   Final    BOTTLES DRAWN AEROBIC AND ANAEROBIC Blood Culture results may not be optimal due to an inadequate volume of blood received in culture bottles Performed at [facility] [address], [place], [place] [address]    Culture NO GROWTH <12 HOURS  Final   Report Status PENDING  Incomplete     Radiological Exams on Admission: DG Chest 2 View  Result Date: 02/16/2020 CLINICAL DATA:  Possible sepsis EXAM: CHEST - 2 VIEW COMPARISON:  01/22/2020 FINDINGS: The heart size and mediastinal contours are within normal limits. No new consolidation or edema. No pleural effusion or pneumothorax. The visualized skeletal structures are unremarkable. IMPRESSION: No acute process in the chest. Electronically Signed   By: Macy Mis M.D.   On: 02/16/2020 13:50   DG Foot Complete Right  Result Date: 02/16/2020 CLINICAL DATA:  Right foot wound. EXAM: RIGHT FOOT COMPLETE - 3+ VIEW COMPARISON:  None. FINDINGS: There is no evidence of fracture or dislocation. There is no evidence of arthropathy or other focal bone abnormality. Plantar and Achilles enthesophytes. Osteopenia. Soft tissues are unremarkable. IMPRESSION: No acute osseous abnormality. Electronically Signed   By: Titus Dubin M.D.   On: 02/16/2020 15:17    EKG: Independently  reviewed.  Sinus tachycardia  Assessment/Plan Principal Problem:   Diabetic foot ulcer (HCC) Active Problems:   Chronic diastolic heart failure (HCC)   Coronary artery disease involving native coronary artery of native heart with angina pectoris (HCC)   Essential hypertension   GERD (gastroesophageal reflux disease)   Hyperlipidemia   Morbid obesity (HCC)   S/P CABG (coronary artery bypass graft)   Uncontrolled type 2 diabetes mellitus ([place])   Sepsis secondary  to diabetic foot ulcer  -Right foot xray without acute osseous abnormality  -Blood cultures pending -Vanco/rocephin/flagyl -Podiatry consulted by EDP   Left upper medial thigh abscess -Antibiotics as above. Obtain CT.  Will likely need I&D  DM type 2, uncontrolled with hyperglycemia -Hold glipizide, januvia, metformin -Check Ha1c -Lantus, SSI   CAD -Plavix, Ranexa   Chronic diastolic HF  -Without acute exacerbation  -Lasix  HTN -Toprol, Ramipril  HLD -Pravachol  Anxiety -Ativan   GERD -PPI  Obesity  Estimated body mass index is 36.02 kg/m as calculated from the following:   Height as of this encounter: 5\' 7"  (1.702 m).   Weight as of this encounter: 104.3 kg.     DVT prophylaxis: Lovenox Code Status: Full code Family Communication: None at bedside Disposition Plan: Pending improvement of her diabetic foot ulcer, podiatry consultation, imaging of thigh abscess.  Likely will return back home once medically stable for discharge. Consults called: Podiatry  Admission status: Inpatient   Severity of Illness: The appropriate patient status for this patient is INPATIENT. Inpatient status is judged to be reasonable and necessary in order to provide the required intensity of service to ensure the patient's safety. The patient's presenting symptoms, physical exam findings, and initial radiographic and laboratory data in the context of their chronic comorbidities is felt to place them at high risk for  further clinical deterioration. Furthermore, it is not anticipated that the patient will be medically stable for discharge from the hospital within 2 midnights of admission.   * I certify that at the point of admission it is my clinical judgment that the patient will require inpatient hospital care spanning beyond 2 midnights from the point of admission due to high intensity of service, high risk for further deterioration and high frequency of surveillance required.Dessa Phi, DO [facility] 02/16/2020, 4:46 PM   Available via Epic secure chat 7am-7pm After these hours, please refer to coverage provider listed on [email or web address]

## 2020-02-16 NOTE — Telephone Encounter (Signed)
I spoke to the ER MD. Will go by and see the patient.

## 2020-02-16 NOTE — Telephone Encounter (Signed)
ED - [place] states she has a Arts development officer for one of our doctors from Dr. Nanda Quinton.

## 2020-02-16 NOTE — ED Notes (Signed)
Pt transported to CT ?

## 2020-02-16 NOTE — Progress Notes (Signed)
Pharmacy Antibiotic Note  Jasmine Richardson is a 59 y.o. female admitted on 02/16/2020 with cellulitis.  Pharmacy has been consulted for vancomycin dosing. Pt with Tmax 100.4 and WBC is elevated at 18.5. SCr is WNL and lactic acid is elevated.   Plan: Vancomycing 1500mg  IV Q24H F/u renal fxn, C&S, clinical status and peak/trough at [facility] F/u continuation of gram negative coverage  Height: 5\' 7"  (170.2 cm) Weight: 230 lb (104.3 kg) IBW/kg (Calculated) : 61.6  Temp (24hrs), Avg:99.6 F (37.6 C), Min:98.9 F (37.2 C), Max:100.4 F (38 C)  Recent Labs  Lab 02/16/20 1314 02/16/20 1327  WBC  --  18.5*  CREATININE  --  0.83  LATICACIDVEN 3.8*  --     Estimated Creatinine Clearance: 90.7 mL/min (by C-G formula based on SCr of 0.83 mg/dL).    Allergies  Allergen Reactions  . Codeine Rash    Antimicrobials this admission: Vanc 3/2>> CTX x 1 3/2  Dose adjustments this admission: N/A  Microbiology results: Pending  Thank you for allowing pharmacy to be a part of this patient's care.  Jasmine Richardson, Rande Lawman 02/16/2020 3:38 PM

## 2020-02-16 NOTE — ED Provider Notes (Signed)
Upon walking to room to evaluate patient nursing states patient had eloped from the ED and was in the parking lot.  Apparently she had called Dr. Amalia Hailey ([facility]) who had sent her here for admission for IV abx and they told her she was to be admitted at [facility] not [facility] (per nursing).  I was able to go out to the parking lot with phlebotomy.  Discussed with patient she can be evaluated and treatment started here in the emergency department at [facility] and upon admission transfer her to [facility] for specialty evaluation. Patient kept on walking stating she "I do not want to be here." She states she plans to go to [facility] for possible admission by Dr. Amalia Hailey. Again reiterated to patient that she can be seen here at the [facility] emergency department however patient was unwilling to discuss anything with me and states "Im leaving." I did not get to full assess or evaluate the patient.  We discussed the nature and purpose, risks and benefits, as well as, the alternatives of treatment. Time was given to allow the opportunity to ask questions and consider their options, and after the discussion, the patient decided to refuse the offerred treatment. The patient was informed that refusal could lead to, but was not limited to, death, permanent disability, or severe pain. If present, I asked the relatives or significant others to dissuade them without success. Prior to refusing, I determined that the patient had the capacity to make their decision and understood the consequences of that decision. After refusal, I made every reasonable opportunity to treat them to the best of my ability.  The patient was notified that they may return to the emergency department at any time for further treatment.      Mayda Shippee A, PA-C 02/16/20 1233    Blanchie Dessert, MD 02/17/20 [phone]

## 2020-02-16 NOTE — ED Provider Notes (Signed)
Emergency Department Provider Note   I have reviewed the triage vital signs and the nursing notes.   HISTORY  Chief Complaint Toe Injury and Fever   HPI Jasmine Richardson is a 59 y.o. female with PMH reviewed below presents emergency department evaluation of right great toe redness with ulcer and discoloration.  Patient reports 2 weeks of symptoms.  She saw her cardiologist on Friday and showed him the toe and she was advised to see a podiatrist.  She saw Dr. Amalia Hailey today and was referred to [facility] for antibiotics and evaluation.  She did develop "fever" in the last 48 hours reporting Tmax 96F. She has had some nausea without vomiting or diarrhea. Toe is painful and red.    Past Medical History:  Diagnosis Date  . Acute chest pain 05/08/2016  . Angina pectoris ([place]) 09/11/2017  . Burping 05/08/2016  . Chronic diastolic heart failure ([place]) 11/06/2016  . Coronary artery disease involving native coronary artery of native heart with angina pectoris ([place]) 09/12/2015   Overview:  PCI and stent of RCA 2009, last cath 2012 with medical therapy  CABG May 2017  . Emphysema lung ([place]) 09/11/2017  . Essential hypertension 09/12/2015  . GERD (gastroesophageal reflux disease) 05/08/2016  . Hyperlipidemia 09/12/2015  . Mediastinitis 06/28/2016  . Morbid obesity ([place]) 05/08/2016  . S/P CABG (coronary artery bypass graft) 06/03/2016   Overview:  The patient underwent sternal reconstruction on 06/21/16 with pec flaps for mediastinitis from a prior CABG in May 2017. On admission, she was critically ill from sepsis and had altered mental status. She was last seen in clinic on 08/09/16 at which time she was doing well.  . Severe sepsis ([place]) 06/03/2016  . Sinus tachycardia 05/08/2016  . Tobacco use disorder 04/20/2016   Overview:  Quit in May 2017.  [name] Uncontrolled type 2 diabetes mellitus ([place]) 05/08/2016  . Wound, surgical, infected 06/06/2016   Overview:  sternal    Patient Active Problem List   Diagnosis Date  Noted  . Angina pectoris ([place]) 09/11/2017  . Emphysema lung ([place]) 09/11/2017  . Chronic diastolic heart failure ([place]) 11/06/2016  . Mediastinitis 06/28/2016  . Wound, surgical, infected 06/06/2016  . Diabetic ketoacidosis without coma associated with type 2 diabetes mellitus ([place]) 06/03/2016  . S/P CABG (coronary artery bypass graft) 06/03/2016  . Severe sepsis ([place]) 06/03/2016  . Acute chest pain 05/08/2016  . Burping 05/08/2016  . GERD (gastroesophageal reflux disease) 05/08/2016  . Morbid obesity ([place]) 05/08/2016  . Sinus tachycardia 05/08/2016  . Uncontrolled type 2 diabetes mellitus ([place]) 05/08/2016  . Tobacco use disorder 04/20/2016  . Coronary artery disease involving native coronary artery of native heart with angina pectoris ([place]) 09/12/2015  . Essential hypertension 09/12/2015  . Hyperlipidemia 09/12/2015    Past Surgical History:  Procedure Laterality Date  . BLADDER SURGERY    . CARDIAC CATHETERIZATION    . CORONARY ARTERY BYPASS GRAFT    . TUBAL LIGATION      Allergies Codeine  Family History  Problem Relation Age of Onset  . Hypertension Mother   . Hyperlipidemia Mother   . Heart attack Father   . Heart disease Father   . Hypertension Father   . Alzheimer's disease Father   . Hypertension Brother   . Hyperlipidemia Brother     Social History Social History   Tobacco Use  . Smoking status: Former Research scientist (life sciences)  . Smokeless tobacco: Never Used  Substance Use Topics  . Alcohol use: No  .  Drug use: No    Review of Systems  Constitutional: Subjective fever.  Eyes: No visual changes. ENT: No sore throat. Cardiovascular: Denies chest pain. Respiratory: Denies shortness of breath. Gastrointestinal: No abdominal pain. Positive nausea.  Musculoskeletal: Negative for back pain. Skin: Right toe swelling and drainage.  Neurological: Negative for headaches, focal weakness or numbness.  10-point ROS otherwise  negative.  ____________________________________________   PHYSICAL EXAM:  VITAL SIGNS: ED Triage Vitals  Enc Vitals Group     BP 02/16/20 1313 (!) 159/86     Pulse Rate 02/16/20 1313 (!) 104     Resp 02/16/20 1313 (!) 22     Temp 02/16/20 1313 99.5 F (37.5 C)     Temp Source 02/16/20 1313 Oral     SpO2 02/16/20 1313 99 %     Weight 02/16/20 1534 230 lb (104.3 kg)     Height 02/16/20 1534 5\' 7"  (1.702 m)   Constitutional: Alert and oriented. Well appearing and in no acute distress. Eyes: Conjunctivae are normal.  Head: Atraumatic. Nose: No congestion/rhinnorhea. Mouth/Throat: Mucous membranes are moist. Neck: No stridor.   Cardiovascular: Tachycardia. Good peripheral circulation. Grossly normal heart sounds.   Respiratory: Normal respiratory effort.  No retractions. Lungs CTAB. Gastrointestinal: No distention.  Musculoskeletal: Right great toe is diffusely erythematous with medial ulceration. Foul smelling.  Neurologic:  Normal speech and language. No gross focal neurologic deficits are appreciated.  Skin:  Skin is warm and dry other than right great toe.    ____________________________________________   LABS (all labs ordered are listed, but only abnormal results are displayed)  Labs Reviewed  COMPREHENSIVE METABOLIC PANEL - Abnormal; Notable for the following components:      Result Value   Sodium 130 (*)    Chloride 91 (*)    Glucose, Bld 578 (*)    Albumin 3.1 (*)    AST 12 (*)    All other components within normal limits  LACTIC ACID, PLASMA - Abnormal; Notable for the following components:   Lactic Acid, Venous 3.8 (*)    All other components within normal limits  LACTIC ACID, PLASMA - Abnormal; Notable for the following components:   Lactic Acid, Venous 2.4 (*)    All other components within normal limits  CBC WITH DIFFERENTIAL/PLATELET - Abnormal; Notable for the following components:   WBC 18.5 (*)    Hemoglobin 11.1 (*)    HCT 34.8 (*)    Platelets  425 (*)    Neutro Abs 15.5 (*)    Monocytes Absolute 1.3 (*)    Abs Immature Granulocytes 0.18 (*)    All other components within normal limits  PROTIME-INR - Abnormal; Notable for the following components:   Prothrombin Time 15.3 (*)    All other components within normal limits  URINALYSIS, ROUTINE W REFLEX MICROSCOPIC - Abnormal; Notable for the following components:   Color, Urine STRAW (*)    Specific Gravity, Urine 1.032 (*)    Glucose, UA >=500 (*)    Protein, ur 30 (*)    All other components within normal limits  CULTURE, BLOOD (ROUTINE X 2)  RESPIRATORY PANEL BY RT PCR (FLU A&B, COVID)  CULTURE, BLOOD (ROUTINE X 2)  I-STAT BETA HCG BLOOD, ED ([facility], [facility], [facility] ONLY)   ____________________________________________  EKG   EKG Interpretation  Date/Time:  Tuesday February 16 2020 15:59:56 EST Ventricular Rate:  103 PR Interval:    QRS Duration: 119 QT Interval:  374 QTC Calculation: 490 R Axis:   118  Text Interpretation: Sinus tachycardia IRBBB and LPFB Anterior infarct, old Changed from 2007 tracing. No STEMI Confirmed by Nanda Quinton [phone]) on 02/16/2020 4:03:43 PM       ____________________________________________  RADIOLOGY  DG Chest 2 View  Result Date: 02/16/2020 CLINICAL DATA:  Possible sepsis EXAM: CHEST - 2 VIEW COMPARISON:  01/22/2020 FINDINGS: The heart size and mediastinal contours are within normal limits. No new consolidation or edema. No pleural effusion or pneumothorax. The visualized skeletal structures are unremarkable. IMPRESSION: No acute process in the chest. Electronically Signed   By: Macy Mis M.D.   On: 02/16/2020 13:50   DG Foot Complete Right  Result Date: 02/16/2020 CLINICAL DATA:  Right foot wound. EXAM: RIGHT FOOT COMPLETE - 3+ VIEW COMPARISON:  None. FINDINGS: There is no evidence of fracture or dislocation. There is no evidence of arthropathy or other focal bone abnormality. Plantar and Achilles enthesophytes. Osteopenia. Soft tissues are  unremarkable. IMPRESSION: No acute osseous abnormality. Electronically Signed   By: Titus Dubin M.D.   On: 02/16/2020 15:17    ____________________________________________   PROCEDURES  Procedure(s) performed:   Procedures  CRITICAL CARE Performed by: Margette Fast Total critical care time: 35 minutes Critical care time was exclusive of separately billable procedures and treating other patients. Critical care was necessary to treat or prevent imminent or life-threatening deterioration. Critical care was time spent personally by me on the following activities: development of treatment plan with patient and/or surrogate as well as nursing, discussions with consultants, evaluation of patient's response to treatment, examination of patient, obtaining history from patient or surrogate, ordering and performing treatments and interventions, ordering and review of laboratory studies, ordering and review of radiographic studies, pulse oximetry and re-evaluation of patient's condition.  Nanda Quinton, MD Emergency Medicine  ____________________________________________   INITIAL IMPRESSION / ASSESSMENT AND PLAN / ED COURSE  Pertinent labs & imaging results that were available during my care of the patient were reviewed by me and considered in my medical decision making (see chart for details).   Patient presents emergency department for evaluation of right great toe erythema with foul smelling discharge and ulceration.  Patient does have palpable pulses in the right foot.  She has tachycardia with fever here along with leukocytosis and elevated lactic acid.  Patient given IV fluids and antibiotics per sepsis protocol.  I have paged right foot and ankle to discuss ongoing management and will likely admit to hospitalist service for trial of antibiotics. COVID testing sent but no symptoms.   04:31 PM  Spoke with [facility] who will consult in the ED. Agree with admit plan.   Lactate  down-trending with IVF.  Discussed patient's case with Medicine to request admission. Patient and family (if present) updated with plan. Care transferred to Medicine service.  I reviewed all nursing notes, vitals, pertinent old records, EKGs, labs, imaging (as available).  ____________________________________________  FINAL CLINICAL IMPRESSION(S) / ED DIAGNOSES  Final diagnoses:  Sepsis, due to unspecified organism, unspecified whether acute organ dysfunction present ([place])  Cellulitis of great toe, right    MEDICATIONS GIVEN DURING THIS VISIT:  Medications  sodium chloride 0.9 % bolus 1,000 mL (has no administration in time range)  vancomycin (VANCOREADY) IVPB 1500 mg/300 mL (has no administration in time range)  cefTRIAXone (ROCEPHIN) 2 g in sodium chloride 0.9 % 100 mL IVPB (2 g Intravenous New Bag/Given 02/16/20 1621)  ondansetron (ZOFRAN) injection 4 mg (4 mg Intravenous Given 02/16/20 1618)  sodium chloride 0.9 % bolus 1,000 mL (  1,000 mLs Intravenous New Bag/Given 02/16/20 1557)    Note:  This document was prepared using Dragon voice recognition software and may include unintentional dictation errors.  Nanda Quinton, MD, [facility] Emergency Medicine    Aimee Timmons, [facility], MD 02/16/20 [phone]

## 2020-02-16 NOTE — ED Notes (Signed)
Patient informed RN that her MD, Dr. Amalia Hailey office, told her to go to [facility] and patient is leaving without being seen to go to [facility]. Pt states her husband is in the parking lot to take her to [facility] and she is leaving without a provider seeing her. Pt educated that she can be seen by a provider here, an assessment and blood work can be done and the provider can order a transfer to [facility] as needed. Pt states she is leaving. Pt leaving without being seen, ambulated out of [facility].

## 2020-02-16 NOTE — ED Notes (Signed)
Vancomycin coming from main pharmacy. Not found in pyxis.

## 2020-02-16 NOTE — ED Triage Notes (Signed)
Pt states she sees Dr. Amalia Hailey in [place] for wound care for infection to big toe on right foot. Pt state she is here per Dr. Amalia Hailey for wound care. Pt has hx of diabetes. Pt states she has open wound to top of left leg in groin area, that has been draining, that patient states is infected.

## 2020-02-16 NOTE — Consult Note (Signed)
Reason for Consult:Infection Referring Physician: Dr. Nanda Quinton, MD  Jasmine Richardson is an 59 y.o. female.  HPI: 59 year old female was directed to come to the emergency room by Dr. Amalia Hailey (a doctor in [place], not with our practice) for concerns of redness as well as ulceration to her right big toe.  She states this started about 2 weeks ago when she had a blister and she used her fingernail to pick it.  Over the last 2 weeks she has noticed increased swelling and redness to her big toe.  She did admit some fevers over the weekend but currently she denies any systemic symptoms including fevers, chills.  Patient was seen in the emergency department before she was transferred to floor.  Consult requested by the emergency room physician.  Past Medical History:  Diagnosis Date  . Acute chest pain 05/08/2016  . Angina pectoris ([place]) 09/11/2017  . Burping 05/08/2016  . Chronic diastolic heart failure ([place]) 11/06/2016  . Coronary artery disease involving native coronary artery of native heart with angina pectoris ([place]) 09/12/2015   Overview:  PCI and stent of RCA 2009, last cath 2012 with medical therapy  CABG May 2017  . Emphysema lung ([place]) 09/11/2017  . Essential hypertension 09/12/2015  . GERD (gastroesophageal reflux disease) 05/08/2016  . Hyperlipidemia 09/12/2015  . Mediastinitis 06/28/2016  . Morbid obesity ([place]) 05/08/2016  . S/P CABG (coronary artery bypass graft) 06/03/2016   Overview:  The patient underwent sternal reconstruction on 06/21/16 with pec flaps for mediastinitis from a prior CABG in May 2017. On admission, she was critically ill from sepsis and had altered mental status. She was last seen in clinic on 08/09/16 at which time she was doing well.  . Severe sepsis ([place]) 06/03/2016  . Sinus tachycardia 05/08/2016  . Tobacco use disorder 04/20/2016   Overview:  Quit in May 2017.  [name] Uncontrolled type 2 diabetes mellitus ([place]) 05/08/2016  . Wound, surgical, infected 06/06/2016   Overview:   sternal    Past Surgical History:  Procedure Laterality Date  . BLADDER SURGERY    . CARDIAC CATHETERIZATION    . CORONARY ARTERY BYPASS GRAFT    . TUBAL LIGATION      Family History  Problem Relation Age of Onset  . Hypertension Mother   . Hyperlipidemia Mother   . Heart attack Father   . Heart disease Father   . Hypertension Father   . Alzheimer's disease Father   . Hypertension Brother   . Hyperlipidemia Brother     Social History:  reports that she has quit smoking. She has never used smokeless tobacco. She reports that she does not drink alcohol or use drugs.  Allergies:  Allergies  Allergen Reactions  . Codeine Rash    Medications:Reviewed   Results for orders placed or performed during the hospital encounter of 02/16/20 (from the past 48 hour(s))  Lactic acid, plasma     Status: Abnormal   Collection Time: 02/16/20  1:14 PM  Result Value Ref Range   Lactic Acid, Venous 3.8 (HH) 0.5 - 1.9 mmol/L    Comment: CRITICAL RESULT CALLED TO, READ BACK BY AND VERIFIED WITH: OLEARY,A RN @1426  ON [id number] BY FLEMINGS Performed at [facility], [address] [place], [place] [address]   Comprehensive metabolic panel     Status: Abnormal   Collection Time: 02/16/20  1:27 PM  Result Value Ref Range   Sodium 130 (L) 135 - 145 mmol/L   Potassium 4.3 3.5 - 5.1  mmol/L   Chloride 91 (L) 98 - 111 mmol/L   CO2 25 22 - 32 mmol/L   Glucose, Bld 578 (HH) 70 - 99 mg/dL    Comment: Glucose reference range applies only to samples taken after fasting for at least 8 hours. CRITICAL RESULT CALLED TO, READ BACK BY AND VERIFIED WITH: HARDY,C RN @1449  ON [id number] BY FLEMINGS    BUN 8 6 - 20 mg/dL   Creatinine, Ser 0.83 0.44 - 1.00 mg/dL   Calcium 9.5 8.9 - 10.3 mg/dL   Total Protein 7.8 6.5 - 8.1 g/dL   Albumin 3.1 (L) 3.5 - 5.0 g/dL   AST 12 (L) 15 - 41 U/L   ALT 11 0 - 44 U/L   Alkaline Phosphatase 62 38 - 126 U/L   Total Bilirubin 0.7 0.3 - 1.2 mg/dL   GFR calc non Af  Amer >60 >60 mL/min   GFR calc Af Amer >60 >60 mL/min   Anion gap 14 5 - 15    Comment: Performed at [facility] [address], [place], [place] [address]  CBC with Differential     Status: Abnormal   Collection Time: 02/16/20  1:27 PM  Result Value Ref Range   WBC 18.5 (H) 4.0 - 10.5 K/uL   RBC 3.88 3.87 - 5.11 MIL/uL   Hemoglobin 11.1 (L) 12.0 - 15.0 g/dL   HCT 34.8 (L) 36.0 - 46.0 %   MCV 89.7 80.0 - 100.0 fL   MCH 28.6 26.0 - 34.0 pg   MCHC 31.9 30.0 - 36.0 g/dL   RDW 12.5 11.5 - 15.5 %   Platelets 425 (H) 150 - 400 K/uL   nRBC 0.0 0.0 - 0.2 %   Neutrophils Relative % 84 %   Neutro Abs 15.5 (H) 1.7 - 7.7 K/uL   Lymphocytes Relative 8 %   Lymphs Abs 1.4 0.7 - 4.0 K/uL   Monocytes Relative 7 %   Monocytes Absolute 1.3 (H) 0.1 - 1.0 K/uL   Eosinophils Relative 0 %   Eosinophils Absolute 0.0 0.0 - 0.5 K/uL   Basophils Relative 0 %   Basophils Absolute 0.0 0.0 - 0.1 K/uL   Immature Granulocytes 1 %   Abs Immature Granulocytes 0.18 (H) 0.00 - 0.07 K/uL    Comment: Performed at [facility] [address] [place], [place] [address]  Protime-INR     Status: Abnormal   Collection Time: 02/16/20  1:27 PM  Result Value Ref Range   Prothrombin Time 15.3 (H) 11.4 - 15.2 seconds   INR 1.2 0.8 - 1.2    Comment: (NOTE) INR goal varies based on device and disease states. Performed at [facility], [place] [address], [place], [place] [address]   I-Stat beta hCG blood, ED     Status: None   Collection Time: 02/16/20  1:47 PM  Result Value Ref Range   I-stat hCG, quantitative <5.0 <5 mIU/mL   Comment 3            Comment:   GEST. AGE      CONC.  (mIU/mL)   <=1 WEEK        5 - 50     2 WEEKS       50 - 500     3 WEEKS       100 - 10,000     4 WEEKS     1,000 - 30,000        FEMALE AND  NON-PREGNANT FEMALE:     LESS THAN 5 mIU/mL   Culture, blood (Routine x 2)     Status: None (Preliminary result)   Collection Time: 02/16/20  2:04 PM   Specimen: BLOOD  Result  Value Ref Range   Specimen Description BLOOD RIGHT ANTECUBITAL    Special Requests      BOTTLES DRAWN AEROBIC AND ANAEROBIC Blood Culture results may not be optimal due to an inadequate volume of blood received in culture bottles Performed at [facility] [address], [place], [place] [address]    Culture NO GROWTH <12 HOURS    Report Status PENDING   Urinalysis, Routine w reflex microscopic     Status: Abnormal   Collection Time: 02/16/20  2:53 PM  Result Value Ref Range   Color, Urine STRAW (A) YELLOW   APPearance CLEAR CLEAR   Specific Gravity, Urine 1.032 (H) 1.005 - 1.030   pH 6.0 5.0 - 8.0   Glucose, UA >=500 (A) NEGATIVE mg/dL   Hgb urine dipstick NEGATIVE NEGATIVE   Bilirubin Urine NEGATIVE NEGATIVE   Ketones, ur NEGATIVE NEGATIVE mg/dL   Protein, ur 30 (A) NEGATIVE mg/dL   Nitrite NEGATIVE NEGATIVE   Leukocytes,Ua NEGATIVE NEGATIVE   RBC / HPF 0-5 0 - 5 RBC/hpf   WBC, UA 0-5 0 - 5 WBC/hpf   Bacteria, UA NONE SEEN NONE SEEN   Squamous Epithelial / LPF 0-5 0 - 5    Comment: Performed at [facility], [place] [address] [place], [place] [address]  Lactic acid, plasma     Status: Abnormal   Collection Time: 02/16/20  3:46 PM  Result Value Ref Range   Lactic Acid, Venous 2.4 (HH) 0.5 - 1.9 mmol/L    Comment: CRITICAL VALUE NOTED.  VALUE IS CONSISTENT WITH PREVIOUSLY REPORTED AND CALLED VALUE. Performed at [facility], [place] [address], [place], [place] [address]   Respiratory Panel by RT PCR (Flu A&B, Covid) - Nasopharyngeal Swab     Status: None   Collection Time: 02/16/20  3:58 PM   Specimen: Nasopharyngeal Swab  Result Value Ref Range   SARS Coronavirus 2 by RT PCR NEGATIVE NEGATIVE    Comment: (NOTE) SARS-CoV-2 target nucleic acids are NOT DETECTED. The SARS-CoV-2 RNA is generally detectable in upper respiratoy specimens during the acute phase of infection. The lowest concentration of SARS-CoV-2 viral copies this assay can detect is 131 copies/mL.  A negative result does not preclude SARS-Cov-2 infection and should not be used as the sole basis for treatment or other patient management decisions. A negative result may occur with  improper specimen collection/handling, submission of specimen other than nasopharyngeal swab, presence of viral mutation(s) within the areas targeted by this assay, and inadequate number of viral copies (<131 copies/mL). A negative result must be combined with clinical observations, patient history, and epidemiological information. The expected result is Negative. Fact Sheet for Patients:  PinkCheek.be Fact Sheet for Healthcare Providers:  GravelBags.it This test is not yet ap proved or cleared by the [place] FDA and  has been authorized for detection and/or diagnosis of SARS-CoV-2 by FDA under an Emergency Use Authorization (EUA). This EUA will remain  in effect (meaning this test can be used) for the duration of the COVID-19 declaration under Section 564(b)(1) of the Act, 21 U.S.C. section 360bbb-3(b)(1), unless the authorization is terminated or revoked sooner.    Influenza A by PCR NEGATIVE NEGATIVE   Influenza B by PCR NEGATIVE NEGATIVE  Comment: (NOTE) The Xpert Xpress SARS-CoV-2/FLU/RSV assay is intended as an aid in  the diagnosis of influenza from Nasopharyngeal swab specimens and  should not be used as a sole basis for treatment. Nasal washings and  aspirates are unacceptable for Xpert Xpress SARS-CoV-2/FLU/RSV  testing. Fact Sheet for Patients: PinkCheek.be Fact Sheet for Healthcare Providers: GravelBags.it This test is not yet approved or cleared by the [place] FDA and  has been authorized for detection and/or diagnosis of SARS-CoV-2 by  FDA under an Emergency Use Authorization (EUA). This EUA will remain  in effect (meaning this test can be used) for the  duration of the  Covid-19 declaration under Section 564(b)(1) of the Act, 21  U.S.C. section 360bbb-3(b)(1), unless the authorization is  terminated or revoked. Performed at [facility], [place] [address] [place], [place] [address]     DG Chest 2 View  Result Date: 02/16/2020 CLINICAL DATA:  Possible sepsis EXAM: CHEST - 2 VIEW COMPARISON:  01/22/2020 FINDINGS: The heart size and mediastinal contours are within normal limits. No new consolidation or edema. No pleural effusion or pneumothorax. The visualized skeletal structures are unremarkable. IMPRESSION: No acute process in the chest. Electronically Signed   By: Macy Mis M.D.   On: 02/16/2020 13:50   CT FEMUR LEFT W CONTRAST  Result Date: 02/16/2020 CLINICAL DATA:  Abscess of left thigh.  Fever. EXAM: CT OF THE LOWER RIGHT EXTREMITY WITH CONTRAST TECHNIQUE: Multidetector CT imaging of the lower right extremity was performed according to the standard protocol following intravenous contrast administration. COMPARISON:  None. CONTRAST:  125mL OMNIPAQUE IOHEXOL 300 MG/ML  SOLN FINDINGS: Bones/Joint/Cartilage No acute abnormalities of the left hip or left knee or left femur. Slight arthritic changes of the left knee. Minimal left knee effusion. Ligaments Suboptimally assessed by CT. The ligaments of the left knee appear intact. Muscles and Tendons There is soft tissue stranding superficial to the proximal left gracilis muscle. There is thickening of the skin overlying this area. There is no definable abscess. There is no discrete myositis. The other muscles of the visualized portion of the left leg appear normal. Soft tissues Skin thickening and soft tissue stranding in the subcutaneous fat of the medial aspect of the proximal left thigh consistent with cellulitis. No definable abscess. Prominent varicose veins in the medial aspect of the left thigh. CLINICAL DATA:  Abscess of left thigh. Fever. EXAM: CT OF THE LOWER RIGHT EXTREMITY WITH  CONTRAST TECHNIQUE: Multidetector CT imaging of the lower right extremity was performed according to the standard protocol following intravenous contrast administration. COMPARISON:  None. CONTRAST:  190mL OMNIPAQUE IOHEXOL 300 MG/ML  SOLN FINDINGS: Bones/Joint/Cartilage No acute abnormalities of the left hip or left knee or left femur. Slight arthritic changes of the left knee. Minimal left knee effusion. Ligaments Suboptimally assessed by CT. The ligaments of the left knee appear intact. Muscles and Tendons There is soft tissue stranding superficial to the proximal left gracilis muscle. There is thickening of the skin overlying this area. There is no definable abscess. There is no discrete myositis. The other muscles of the visualized portion of the left leg appear normal. Soft tissues Skin thickening and soft tissue stranding in the subcutaneous fat of the medial aspect of the proximal left thigh consistent with cellulitis. No definable abscess. Prominent varicose veins in the medial aspect of the left thigh. IMPRESSION: 1. Findings consistent with cellulitis of the medial aspect of the proximal left thigh. This area extends from the level of the  symphysis pubis of approximately 15 cm distally superficial to the gracilis muscle. 2. No definable abscess. 3. No evidence of myositis or osteomyelitis. 4. Minimal left knee effusion. 5. Prominent varicose veins in the medial aspect of the left thigh. Electronically Signed   By: Lorriane Shire M.D.   On: 02/16/2020 19:12   DG Foot Complete Right  Result Date: 02/16/2020 CLINICAL DATA:  Right foot wound. EXAM: RIGHT FOOT COMPLETE - 3+ VIEW COMPARISON:  None. FINDINGS: There is no evidence of fracture or dislocation. There is no evidence of arthropathy or other focal bone abnormality. Plantar and Achilles enthesophytes. Osteopenia. Soft tissues are unremarkable. IMPRESSION: No acute osseous abnormality. Electronically Signed   By: Titus Dubin M.D.   On: 02/16/2020  15:17    Review of Systems Blood pressure 135/66, pulse 96, temperature (!) 100.4 F (38 C), temperature source Oral, resp. rate (!) 28, height 5\' 7"  (1.702 m), weight 104.3 kg, SpO2 90 %. Physical Exam  General: AAO x3, NAD- laying in bed  Dermatological: Picture below is from the emergency room.  There is cellulitis to the hallux with central scab, eschar and surrounding macerated tissue.  Not able to identify any purulence.  There is no fluctuation crepitation.  There is no malodor.  Mild warmth of the foot.        Vascular: DP, PT pulses 1/4  Neruologic: Sensation decreased  Musculoskeletal: No pain   Assessment/Plan: Ulceration, cellulitis right hallux  -X-rays were obtained which was unremarkable. I independently reviewed the x-rays as well.  Upon initial presentation her white blood cell count was 18.5 and her lactic acid level of 3.8 which is trended down to 2.4.  Overall she looks well and is not ill-appearing.  Currently not expensing any pain.  She has been started on ceftriaxone and vancomycin.  Infection of the toe appears to be localized we will continue discussion does on IV antibiotics and will reevaluate tomorrow.  White small cell count not improving no clinical response recommend MRI of the foot to rule osteomyelitis. Recommend arterial duplex to ensure adequate circulation to the foot. We did discuss that she is at risk of amputation.   I will continue to follow her.    Trula Slade 02/16/2020, 7:57 PM   O: [phone] C: [phone]

## 2020-02-16 NOTE — ED Notes (Signed)
Pt returned from CT °

## 2020-02-17 DIAGNOSIS — L02611 Cutaneous abscess of right foot: Secondary | ICD-10-CM

## 2020-02-17 LAB — BASIC METABOLIC PANEL
Anion gap: 14 (ref 5–15)
BUN: 6 mg/dL (ref 6–20)
CO2: 22 mmol/L (ref 22–32)
Calcium: 8.7 mg/dL — ABNORMAL LOW (ref 8.9–10.3)
Chloride: 96 mmol/L — ABNORMAL LOW (ref 98–111)
Creatinine, Ser: 0.77 mg/dL (ref 0.44–1.00)
GFR calc Af Amer: >60 mL/min (ref >60)
GFR calc non Af Amer: >60 mL/min (ref >60)
Glucose, Bld: 383 mg/dL — ABNORMAL HIGH (ref 70–99)
Potassium: 3.8 mmol/L (ref 3.5–5.1)
Sodium: 132 mmol/L — ABNORMAL LOW (ref 135–145)

## 2020-02-17 LAB — GLUCOSE, CAPILLARY
Glucose-Capillary: 293 mg/dL — ABNORMAL HIGH (ref 70–99)
Glucose-Capillary: 304 mg/dL — ABNORMAL HIGH (ref 70–99)
Glucose-Capillary: 323 mg/dL — ABNORMAL HIGH (ref 70–99)
Glucose-Capillary: 344 mg/dL — ABNORMAL HIGH (ref 70–99)
Glucose-Capillary: 402 mg/dL — ABNORMAL HIGH (ref 70–99)

## 2020-02-17 LAB — CBC
HCT: 30.4 % — ABNORMAL LOW (ref 36.0–46.0)
Hemoglobin: 10.1 g/dL — ABNORMAL LOW (ref 12.0–15.0)
MCH: 28.9 pg (ref 26.0–34.0)
MCHC: 33.2 g/dL (ref 30.0–36.0)
MCV: 87.1 fL (ref 80.0–100.0)
Platelets: 317 10*3/uL (ref 150–400)
RBC: 3.49 MIL/uL — ABNORMAL LOW (ref 3.87–5.11)
RDW: 12.3 % (ref 11.5–15.5)
WBC: 15.8 10*3/uL — ABNORMAL HIGH (ref 4.0–10.5)
nRBC: 0 % (ref 0.0–0.2)

## 2020-02-17 LAB — HIV ANTIBODY (ROUTINE TESTING W REFLEX): HIV Screen 4th Generation wRfx: NONREACTIVE

## 2020-02-17 MED ORDER — INSULIN ASPART 100 UNIT/ML ~~LOC~~ SOLN
8.0000 [IU] | Freq: Three times a day (TID) | SUBCUTANEOUS | Status: DC
  Administered 2020-02-17 – 2020-02-18 (×4): 8 [IU] via SUBCUTANEOUS

## 2020-02-17 MED ORDER — INSULIN GLARGINE 100 UNIT/ML ~~LOC~~ SOLN
25.0000 [IU] | Freq: Every day | SUBCUTANEOUS | Status: DC
  Administered 2020-02-17: 22:00:00 25 [IU] via SUBCUTANEOUS
  Filled 2020-02-17 (×3): qty 0.25

## 2020-02-17 MED ORDER — SODIUM CHLORIDE 0.9 % IV SOLN
2.0000 g | INTRAVENOUS | Status: DC
  Administered 2020-02-17 – 2020-02-20 (×4): 2 g via INTRAVENOUS
  Filled 2020-02-17 (×4): qty 20

## 2020-02-17 MED ORDER — LIVING WELL WITH DIABETES BOOK
Freq: Once | Status: DC
  Filled 2020-02-17: qty 1

## 2020-02-17 MED ORDER — ENOXAPARIN SODIUM 60 MG/0.6ML ~~LOC~~ SOLN
50.0000 mg | SUBCUTANEOUS | Status: DC
  Administered 2020-02-17 – 2020-02-20 (×3): 50 mg via SUBCUTANEOUS
  Filled 2020-02-17 (×3): qty 0.6

## 2020-02-17 NOTE — Plan of Care (Signed)

## 2020-02-17 NOTE — Progress Notes (Addendum)
Inpatient Diabetes Program Recommendations  AACE/ADA: New Consensus Statement on Inpatient Glycemic Control (2015)  Target Ranges:  Prepandial:   less than 140 mg/dL      Peak postprandial:   less than 180 mg/dL (1-2 hours)      Critically ill patients:  140 - 180 mg/dL   Lab Results  Component Value Date   GLUCAP 323 (H) 02/17/2020   HGBA1C 11.7 (H) 02/16/2020    Review of Glycemic Control Results for BRIANA, GARRELTS (MRN [id number]) as of 02/17/2020 12:24  Ref. Range 02/16/2020 20:37 02/16/2020 23:45 02/17/2020 06:14 02/17/2020 11:59  Glucose-Capillary Latest Ref Range: 70 - 99 mg/dL 364 (H) 402 (H) 344 (H) 323 (H)   Diabetes history: DM2 Outpatient Diabetes medications: Lantus 10 units + Glucotrol 10 mg bid + Metformin 1 gm bid + Januvia 100 mg qd Current orders for Inpatient glycemic control: Lantus 10 units + Novolog resistant correction tid + 0-5 units hs  Inpatient Diabetes Program Recommendations:    Patient has received 45 units Novolog correction since last pm -Increase Lantus to 25 units  -Novolog 8 units tid meal coverage if eats 50%  A1c 11.7.Will speak with patient. 2:00 pm Spoke with pt about A1C results 11.7 (average blood glucose over the past 2-3 months =289) and explained what an A1C is, basic pathophysiology of DM Type 2, basic home care, basic diabetes diet nutrition principles, importance of checking CBGs and maintaining good CBG control to prevent long-term and short-term complications. Reviewed signs and symptoms of hyperglycemia and hypoglycemia and how to treat hypoglycemia at home. Also reviewed blood sugar goals at home.  RNs to provide ongoing basic DM education at bedside with this patient. Have ordered educational booklet. Discussed basic plate method with patient and limiting sugar and carbohydrates. Patient states she has significantly decreased regular pepsi to 5 small bottles per week and tea to half cut. Discussed ways to change drinks to less sugar to assist  with diabetes management. Patient states she has been taking Lantus 10 units daily around 3 months and does not have instructions to increase. Explained she will probably go home on increased amount of insulin and she is ok with changes. Patient changes her insulin pen needle with each new injection and takes regularly. Patient has glucometer and strips.  Thank you, Nani Gasser. Nava Song, RN, MSN, CDE  Diabetes Coordinator Inpatient Glycemic Control Team Team Pager [phone] (8am-5pm) 02/17/2020 12:30 PM

## 2020-02-17 NOTE — Plan of Care (Signed)
  Problem: Skin Integrity: Goal: Risk for impaired skin integrity will decrease Outcome: Progressing   Problem: Pain Managment: Goal: General experience of comfort will improve Outcome: Progressing   Problem: Elimination: Goal: Will not experience complications related to bowel motility Outcome: Progressing   Problem: Clinical Measurements: Goal: Ability to maintain clinical measurements within normal limits will improve Outcome: Progressing   Problem: Clinical Measurements: Goal: Will remain free from infection Outcome: Progressing   Problem: Education: Goal: Knowledge of General Education information will improve Description: Including pain rating scale, medication(s)/side effects and non-pharmacologic comfort measures Outcome: Progressing   Problem: Clinical Measurements: Goal: Diagnostic test results will improve Outcome: Progressing

## 2020-02-17 NOTE — Progress Notes (Signed)
PROGRESS NOTE    Jasmine Richardson  [id number] DOB: Aug 27, 1961 DOA: 02/16/2020 PCP: Ernestene Kiel, MD    Brief Narrative:  59 year old female with history of coronary artery disease, chronic diastolic heart failure, hypertension, hyperlipidemia, type 2 diabetes uncontrolled presented to the hospital from podiatry surgery office with right great toe wound of about 2 weeks.  Also noted some fever at home.  Also had some pain on left inner thigh. In the emergency room she was found to have leukocytosis.  Started on antibiotics and admitted.   Assessment & Plan:   Principal Problem:   Diabetic foot ulcer ([place]) Active Problems:   Chronic diastolic heart failure (HCC)   Coronary artery disease involving native coronary artery of native heart with angina pectoris ([place])   Essential hypertension   GERD (gastroesophageal reflux disease)   Hyperlipidemia   Morbid obesity (HCC)   S/P CABG (coronary artery bypass graft)   Uncontrolled type 2 diabetes mellitus ([place])  Sepsis secondary to diabetic foot ulcer: Patient has significant localized infection.  Cultures are negative so far.  On broad-spectrum antibiotics with Rocephin, vancomycin and Flagyl that we will continue today as she also has some inflammation of her left inner thigh. X-ray without any bony abnormalities. Followed by podiatry surgery, defer any surgical intervention needed to the surgical team.  Left upper medial thigh cellulitis: Clinically some evidence of induration, no fluctuation.  CT scan with evidence of inflammation without any phlegmon or abscess.  Will treat with broad-spectrum antibiotics.  Cultures pending.  Will need close monitoring for need of any I&D.  No evidence of necrotizing fasciitis.  Type 2 diabetes, uncontrolled with hyperglycemia: A1c more than 11.  Blood sugars more than 300.  She was on insulin.  Will increase doses of insulin and continue to uptitrate to achieve better control.  Anticipating patient  will need increased dose of insulin on discharge.  Hypertension: Blood pressure stable on Toprol and ramipril.  Hyperlipidemia, she is on a statin that she will continue.   DVT prophylaxis: Lovenox Code Status: Full code Family Communication: None Disposition Plan: patient is from home. Anticipated DC to home, Barriers to discharge on IV antibiotics, continues to need IV antibiotics and clinical monitoring.  May need inpatient procedures including surgery to her toes.   Consultants:   Podiatry surgery  Procedures:   None  Antimicrobials:  Anti-infectives (From admission, onward)   Start     Dose/Rate Route Frequency Ordered Stop   02/17/20 1700  cefTRIAXone (ROCEPHIN) 1 g in sodium chloride 0.9 % 100 mL IVPB  Status:  Discontinued     1 g 200 mL/hr over 30 Minutes Intravenous Every 24 hours 02/16/20 1746 02/17/20 0918   02/17/20 1700  cefTRIAXone (ROCEPHIN) 2 g in sodium chloride 0.9 % 100 mL IVPB     2 g 200 mL/hr over 30 Minutes Intravenous Every 24 hours 02/17/20 0918     02/16/20 2330  vancomycin (VANCOREADY) IVPB 1500 mg/300 mL     1,500 mg 150 mL/hr over 120 Minutes Intravenous Every 24 hours 02/16/20 2252     02/16/20 1800  metroNIDAZOLE (FLAGYL) IVPB 500 mg     500 mg 100 mL/hr over 60 Minutes Intravenous Every 8 hours 02/16/20 1746     02/16/20 1600  vancomycin (VANCOREADY) IVPB 1500 mg/300 mL  Status:  Discontinued     1,500 mg 150 mL/hr over 120 Minutes Intravenous Every 24 hours 02/16/20 1535 02/16/20 2252   02/16/20 1545  vancomycin (VANCOCIN) IVPB 1000 mg/200 mL  premix  Status:  Discontinued     1,000 mg 200 mL/hr over 60 Minutes Intravenous  Once 02/16/20 1530 02/16/20 1535   02/16/20 1545  cefTRIAXone (ROCEPHIN) 2 g in sodium chloride 0.9 % 100 mL IVPB     2 g 200 mL/hr over 30 Minutes Intravenous  Once 02/16/20 1530 02/16/20 1726         Subjective: Patient was seen and examined in the morning rounds.  She does have some pain on her left thigh.  She  says it comes and goes.  She says her left thigh had some infection in the past. Toe does not hurt much.  Objective: Vitals:   02/16/20 1624 02/16/20 1630 02/16/20 1900 02/17/20 0500  BP: (!) 134/95 135/66 136/64 110/73  Pulse: (!) 103 96 65 83  Resp: (!) 25 (!) 28 20 18   Temp:   (!) 100.7 F (38.2 C) 99.6 F (37.6 C)  TempSrc:      SpO2: 95% 90% 95% 99%  Weight:      Height:        Intake/Output Summary (Last 24 hours) at 02/17/2020 1346 Last data filed at 02/17/2020 0500 Gross per 24 hour  Intake 1980 ml  Output 2 ml  Net 1978 ml   Filed Weights   02/16/20 1534  Weight: 104.3 kg    Examination:  General exam: Appears calm and comfortable, on room air. Respiratory system: Clear to auscultation. Respiratory effort normal.  No added sounds. Cardiovascular system: S1 & S2 heard, RRR. No JVD, murmurs, rubs, gallops or clicks.  Gastrointestinal system: Abdomen is nondistended, soft and nontender. No organomegaly or masses felt. Normal bowel sounds heard. Central nervous system: Alert and oriented. No focal neurological deficits. Extremities: Symmetric 5 x 5 power. Skin: No rashes, lesions or ulcers Psychiatry: Judgement and insight appear normal. Mood & affect appropriate.  Examined with female chaperone at the bedside. Left inner thigh, diffuse swelling, nontender with firm induration, no fluctuation, overlying skin is normal in color and mobile. Right great toe with open ulcer, muscles exposed with surrounding erythema.   Data Reviewed: I have personally reviewed following labs and imaging studies  CBC: Recent Labs  Lab 02/16/20 1327 02/17/20 0523  WBC 18.5* 15.8*  NEUTROABS 15.5*  --   HGB 11.1* 10.1*  HCT 34.8* 30.4*  MCV 89.7 87.1  PLT 425* [id number]   Basic Metabolic Panel: Recent Labs  Lab 02/16/20 1327 02/17/20 0523  NA 130* 132*  K 4.3 3.8  CL 91* 96*  CO2 25 22  GLUCOSE 578* 383*  BUN 8 6  CREATININE 0.83 0.77  CALCIUM 9.5 8.7*   GFR: Estimated  Creatinine Clearance: 94.1 mL/min (by C-G formula based on SCr of 0.77 mg/dL). Liver Function Tests: Recent Labs  Lab 02/16/20 1327  AST 12*  ALT 11  ALKPHOS 62  BILITOT 0.7  PROT 7.8  ALBUMIN 3.1*   No results for input(s): LIPASE, AMYLASE in the last 168 hours. No results for input(s): AMMONIA in the last 168 hours. Coagulation Profile: Recent Labs  Lab 02/16/20 1327  INR 1.2   Cardiac Enzymes: No results for input(s): CKTOTAL, CKMB, CKMBINDEX, TROPONINI in the last 168 hours. BNP (last 3 results) No results for input(s): PROBNP in the last 8760 hours. HbA1C: Recent Labs    02/16/20 1327  HGBA1C 11.7*   CBG: Recent Labs  Lab 02/16/20 2037 02/16/20 2345 02/17/20 0614 02/17/20 1159  GLUCAP 364* 402* 344* 323*   Lipid Profile: No results for input(s):  CHOL, HDL, LDLCALC, TRIG, CHOLHDL, LDLDIRECT in the last 72 hours. Thyroid Function Tests: No results for input(s): TSH, T4TOTAL, FREET4, T3FREE, THYROIDAB in the last 72 hours. Anemia Panel: No results for input(s): VITAMINB12, FOLATE, FERRITIN, TIBC, IRON, RETICCTPCT in the last 72 hours. Sepsis Labs: Recent Labs  Lab 02/16/20 1314 02/16/20 1546  LATICACIDVEN 3.8* 2.4*    Recent Results (from the past 240 hour(s))  Culture, blood (Routine x 2)     Status: None (Preliminary result)   Collection Time: 02/16/20  2:04 PM   Specimen: BLOOD  Result Value Ref Range Status   Specimen Description BLOOD RIGHT ANTECUBITAL  Final   Special Requests   Final    BOTTLES DRAWN AEROBIC AND ANAEROBIC Blood Culture results may not be optimal due to an inadequate volume of blood received in culture bottles   Culture   Final    NO GROWTH < 24 HOURS Performed at [facility], [place] [address], [place], [place] [address]    Report Status PENDING  Incomplete  Culture, blood (Routine x 2)     Status: None (Preliminary result)   Collection Time: 02/16/20  3:46 PM   Specimen: BLOOD  Result Value Ref Range Status    Specimen Description BLOOD LEFT ANTECUBITAL  Final   Special Requests   Final    BOTTLES DRAWN AEROBIC AND ANAEROBIC Blood Culture adequate volume   Culture   Final    NO GROWTH < 24 HOURS Performed at [facility], [place] [address], [place], [place] [address]    Report Status PENDING  Incomplete  Respiratory Panel by RT PCR (Flu A&B, Covid) - Nasopharyngeal Swab     Status: None   Collection Time: 02/16/20  3:58 PM   Specimen: Nasopharyngeal Swab  Result Value Ref Range Status   SARS Coronavirus 2 by RT PCR NEGATIVE NEGATIVE Final    Comment: (NOTE) SARS-CoV-2 target nucleic acids are NOT DETECTED. The SARS-CoV-2 RNA is generally detectable in upper respiratoy specimens during the acute phase of infection. The lowest concentration of SARS-CoV-2 viral copies this assay can detect is 131 copies/mL. A negative result does not preclude SARS-Cov-2 infection and should not be used as the sole basis for treatment or other patient management decisions. A negative result may occur with  improper specimen collection/handling, submission of specimen other than nasopharyngeal swab, presence of viral mutation(s) within the areas targeted by this assay, and inadequate number of viral copies (<131 copies/mL). A negative result must be combined with clinical observations, patient history, and epidemiological information. The expected result is Negative. Fact Sheet for Patients:  PinkCheek.be Fact Sheet for Healthcare Providers:  GravelBags.it This test is not yet ap proved or cleared by the [place] FDA and  has been authorized for detection and/or diagnosis of SARS-CoV-2 by FDA under an Emergency Use Authorization (EUA). This EUA will remain  in effect (meaning this test can be used) for the duration of the COVID-19 declaration under Section 564(b)(1) of the Act, 21 U.S.C. section 360bbb-3(b)(1), unless the authorization is  terminated or revoked sooner.    Influenza A by PCR NEGATIVE NEGATIVE Final   Influenza B by PCR NEGATIVE NEGATIVE Final    Comment: (NOTE) The Xpert Xpress SARS-CoV-2/FLU/RSV assay is intended as an aid in  the diagnosis of influenza from Nasopharyngeal swab specimens and  should not be used as a sole basis for treatment. Nasal washings and  aspirates are unacceptable for Xpert Xpress SARS-CoV-2/FLU/RSV  testing. Fact Sheet for  Patients: PinkCheek.be Fact Sheet for Healthcare Providers: GravelBags.it This test is not yet approved or cleared by the [place] FDA and  has been authorized for detection and/or diagnosis of SARS-CoV-2 by  FDA under an Emergency Use Authorization (EUA). This EUA will remain  in effect (meaning this test can be used) for the duration of the  Covid-19 declaration under Section 564(b)(1) of the Act, 21  U.S.C. section 360bbb-3(b)(1), unless the authorization is  terminated or revoked. Performed at [facility], [place] [address], [place], [place] [address]          Radiology Studies: DG Chest 2 View  Result Date: 02/16/2020 CLINICAL DATA:  Possible sepsis EXAM: CHEST - 2 VIEW COMPARISON:  01/22/2020 FINDINGS: The heart size and mediastinal contours are within normal limits. No new consolidation or edema. No pleural effusion or pneumothorax. The visualized skeletal structures are unremarkable. IMPRESSION: No acute process in the chest. Electronically Signed   By: Macy Mis M.D.   On: 02/16/2020 13:50   CT FEMUR LEFT W CONTRAST  Result Date: 02/16/2020 CLINICAL DATA:  Abscess of left thigh.  Fever. EXAM: CT OF THE LOWER RIGHT EXTREMITY WITH CONTRAST TECHNIQUE: Multidetector CT imaging of the lower right extremity was performed according to the standard protocol following intravenous contrast administration. COMPARISON:  None. CONTRAST:  116mL OMNIPAQUE IOHEXOL 300 MG/ML  SOLN FINDINGS:  Bones/Joint/Cartilage No acute abnormalities of the left hip or left knee or left femur. Slight arthritic changes of the left knee. Minimal left knee effusion. Ligaments Suboptimally assessed by CT. The ligaments of the left knee appear intact. Muscles and Tendons There is soft tissue stranding superficial to the proximal left gracilis muscle. There is thickening of the skin overlying this area. There is no definable abscess. There is no discrete myositis. The other muscles of the visualized portion of the left leg appear normal. Soft tissues Skin thickening and soft tissue stranding in the subcutaneous fat of the medial aspect of the proximal left thigh consistent with cellulitis. No definable abscess. Prominent varicose veins in the medial aspect of the left thigh. CLINICAL DATA:  Abscess of left thigh. Fever. EXAM: CT OF THE LOWER RIGHT EXTREMITY WITH CONTRAST TECHNIQUE: Multidetector CT imaging of the lower right extremity was performed according to the standard protocol following intravenous contrast administration. COMPARISON:  None. CONTRAST:  147mL OMNIPAQUE IOHEXOL 300 MG/ML  SOLN FINDINGS: Bones/Joint/Cartilage No acute abnormalities of the left hip or left knee or left femur. Slight arthritic changes of the left knee. Minimal left knee effusion. Ligaments Suboptimally assessed by CT. The ligaments of the left knee appear intact. Muscles and Tendons There is soft tissue stranding superficial to the proximal left gracilis muscle. There is thickening of the skin overlying this area. There is no definable abscess. There is no discrete myositis. The other muscles of the visualized portion of the left leg appear normal. Soft tissues Skin thickening and soft tissue stranding in the subcutaneous fat of the medial aspect of the proximal left thigh consistent with cellulitis. No definable abscess. Prominent varicose veins in the medial aspect of the left thigh. IMPRESSION: 1. Findings consistent with cellulitis of  the medial aspect of the proximal left thigh. This area extends from the level of the symphysis pubis of approximately 15 cm distally superficial to the gracilis muscle. 2. No definable abscess. 3. No evidence of myositis or osteomyelitis. 4. Minimal left knee effusion. 5. Prominent varicose veins in the medial aspect of the left thigh. Electronically Signed  By: Lorriane Shire M.D.   On: 02/16/2020 19:12   DG Foot Complete Right  Result Date: 02/16/2020 CLINICAL DATA:  Right foot wound. EXAM: RIGHT FOOT COMPLETE - 3+ VIEW COMPARISON:  None. FINDINGS: There is no evidence of fracture or dislocation. There is no evidence of arthropathy or other focal bone abnormality. Plantar and Achilles enthesophytes. Osteopenia. Soft tissues are unremarkable. IMPRESSION: No acute osseous abnormality. Electronically Signed   By: Titus Dubin M.D.   On: 02/16/2020 15:17        Scheduled Meds: . clopidogrel  75 mg Oral Daily  . enoxaparin (LOVENOX) injection  50 mg Subcutaneous Q24H  . furosemide  40 mg Oral Daily  . insulin aspart  0-20 Units Subcutaneous TID WC  . insulin aspart  0-5 Units Subcutaneous QHS  . insulin aspart  8 Units Subcutaneous TID WC  . insulin glargine  25 Units Subcutaneous QHS  . metoprolol succinate  150 mg Oral Daily  . pantoprazole  40 mg Oral Daily  . pravastatin  20 mg Oral QPM  . ramipril  1.25 mg Oral Daily  . sodium chloride flush  3 mL Intravenous Q12H   Continuous Infusions: . sodium chloride    . cefTRIAXone (ROCEPHIN)  IV    . metronidazole 500 mg (02/17/20 1035)  . sodium chloride    . vancomycin Stopped (02/17/20 0824)     LOS: 1 day    Time spent: 30 minutes    Barb Merino, MD [facility] Pager [phone]

## 2020-02-17 NOTE — Progress Notes (Signed)
Subjective: 59 year old female was admitted to the hospital last night for concerns of cellulitis and ulceration to her right foot.  Also notes cellulitis of her left thigh.  She states about 2 weeks ago she started to notice a blister on her right big toe that she picked with her fingernail.  Over the last 2 weeks she noticed increased swelling, redness of the toe and she started to develop systemic symptoms including fevers.  She was seen by an outside provider instructed to come to [facility] for admission.  She was started on IV antibiotics last night and leukocytosis is trending down.  Overall she feels better.  Still mild fever she reports but overall improving today.   Objective: AAO x3, NAD-husband on telephone DP, PT pulses 1/4.  Necrotic area to the medial aspect of the right hallux with cellulitis to the entire hallux extending just proximal to the MPJ.  Mild warmth of the foot.  Mild swelling.  There is no fluctuation crepitation.  There is no malodor. No other open lesions identified to bilateral lower extremities that I can see. No pain with calf compression, swelling, warmth, erythema       Assessment: Cellulitis right hallux with ulceration  Plan: Although plain film x-rays did not reveal any evidence of acute osteomyelitis and still concerned about this.  Recommend either CT scan or MRI to rule out osteomyelitis or deeper infection.  Also would recommend arterial duplex to ensure adequate circulation.  Today had a long discussion with her and her husband by telephone and regard to treatment options.  Although she seen some minimal improvement diabetes mellitus currently and her leukocytosis is trending down still very concerned with the toe we discussed she is at high risk of amputation of this toe.  She is very upset by this but after discussion she is open to this if needed.  Continue identified for now and await further testing.  I will continue to follow her.  Celesta Gentile, DPM O: [phone] C: [phone]

## 2020-02-17 NOTE — Plan of Care (Signed)
  Problem: Education: Goal: Knowledge of General Education information will improve Description: Including pain rating scale, medication(s)/side effects and non-pharmacologic comfort measures Outcome: Progressing   Problem: Activity: Goal: Risk for activity intolerance will decrease Outcome: Progressing   Problem: Nutrition: Goal: Adequate nutrition will be maintained Outcome: Progressing   Problem: Coping: Goal: Level of anxiety will decrease Outcome: Progressing   

## 2020-02-18 ENCOUNTER — Inpatient Hospital Stay (HOSPITAL_COMMUNITY): Payer: Commercial Managed Care - PPO

## 2020-02-18 DIAGNOSIS — L02611 Cutaneous abscess of right foot: Secondary | ICD-10-CM

## 2020-02-18 DIAGNOSIS — L0390 Cellulitis, unspecified: Secondary | ICD-10-CM

## 2020-02-18 DIAGNOSIS — I739 Peripheral vascular disease, unspecified: Secondary | ICD-10-CM

## 2020-02-18 LAB — CBC WITH DIFFERENTIAL/PLATELET
Abs Immature Granulocytes: 0.14 10*3/uL — ABNORMAL HIGH (ref 0.00–0.07)
Basophils Absolute: 0.1 10*3/uL (ref 0.0–0.1)
Basophils Relative: 0 %
Eosinophils Absolute: 0.1 10*3/uL (ref 0.0–0.5)
Eosinophils Relative: 1 %
HCT: 28.8 % — ABNORMAL LOW (ref 36.0–46.0)
Hemoglobin: 9.5 g/dL — ABNORMAL LOW (ref 12.0–15.0)
Immature Granulocytes: 1 %
Lymphocytes Relative: 10 %
Lymphs Abs: 1.5 10*3/uL (ref 0.7–4.0)
MCH: 28.9 pg (ref 26.0–34.0)
MCHC: 33 g/dL (ref 30.0–36.0)
MCV: 87.5 fL (ref 80.0–100.0)
Monocytes Absolute: 1 10*3/uL (ref 0.1–1.0)
Monocytes Relative: 6 %
Neutro Abs: 13 10*3/uL — ABNORMAL HIGH (ref 1.7–7.7)
Neutrophils Relative %: 82 %
Platelets: 367 10*3/uL (ref 150–400)
RBC: 3.29 MIL/uL — ABNORMAL LOW (ref 3.87–5.11)
RDW: 12.5 % (ref 11.5–15.5)
WBC: 15.7 10*3/uL — ABNORMAL HIGH (ref 4.0–10.5)
nRBC: 0 % (ref 0.0–0.2)

## 2020-02-18 LAB — PHOSPHORUS: Phosphorus: 2.4 mg/dL — ABNORMAL LOW (ref 2.5–4.6)

## 2020-02-18 LAB — BASIC METABOLIC PANEL
Anion gap: 11 (ref 5–15)
BUN: 9 mg/dL (ref 6–20)
CO2: 21 mmol/L — ABNORMAL LOW (ref 22–32)
Calcium: 7.9 mg/dL — ABNORMAL LOW (ref 8.9–10.3)
Chloride: 99 mmol/L (ref 98–111)
Creatinine, Ser: 0.77 mg/dL (ref 0.44–1.00)
GFR calc Af Amer: >60 mL/min (ref >60)
GFR calc non Af Amer: >60 mL/min (ref >60)
Glucose, Bld: 282 mg/dL — ABNORMAL HIGH (ref 70–99)
Potassium: 4.3 mmol/L (ref 3.5–5.1)
Sodium: 131 mmol/L — ABNORMAL LOW (ref 135–145)

## 2020-02-18 LAB — GLUCOSE, CAPILLARY
Glucose-Capillary: 273 mg/dL — ABNORMAL HIGH (ref 70–99)
Glucose-Capillary: 317 mg/dL — ABNORMAL HIGH (ref 70–99)
Glucose-Capillary: 307 mg/dL — ABNORMAL HIGH (ref 70–99)
Glucose-Capillary: 245 mg/dL — ABNORMAL HIGH (ref 70–99)
Glucose-Capillary: 358 mg/dL — ABNORMAL HIGH (ref 70–99)

## 2020-02-18 LAB — MAGNESIUM: Magnesium: 1.1 mg/dL — ABNORMAL LOW (ref 1.7–2.4)

## 2020-02-18 MED ORDER — MAGNESIUM SULFATE 2 GM/50ML IV SOLN
2.0000 g | Freq: Once | INTRAVENOUS | Status: AC
  Administered 2020-02-18: 12:00:00 2 g via INTRAVENOUS
  Filled 2020-02-18 (×2): qty 50

## 2020-02-18 MED ORDER — VANCOMYCIN HCL IN DEXTROSE 1-5 GM/200ML-% IV SOLN
1000.0000 mg | Freq: Two times a day (BID) | INTRAVENOUS | Status: DC
  Administered 2020-02-18 – 2020-02-21 (×5): 1000 mg via INTRAVENOUS
  Filled 2020-02-18 (×6): qty 200

## 2020-02-18 MED ORDER — INSULIN GLARGINE 100 UNIT/ML ~~LOC~~ SOLN
35.0000 [IU] | Freq: Every day | SUBCUTANEOUS | Status: DC
  Administered 2020-02-18: 21:00:00 35 [IU] via SUBCUTANEOUS
  Filled 2020-02-18 (×2): qty 0.35

## 2020-02-18 MED ORDER — MAGNESIUM OXIDE 400 (241.3 MG) MG PO TABS
400.0000 mg | ORAL_TABLET | Freq: Two times a day (BID) | ORAL | Status: DC
  Administered 2020-02-18 – 2020-02-21 (×6): 400 mg via ORAL
  Filled 2020-02-18 (×6): qty 1

## 2020-02-18 MED ORDER — CHLORHEXIDINE GLUCONATE CLOTH 2 % EX PADS
6.0000 | MEDICATED_PAD | Freq: Once | CUTANEOUS | Status: AC
  Administered 2020-02-19: 09:00:00 6 via TOPICAL

## 2020-02-18 MED ORDER — CHLORHEXIDINE GLUCONATE CLOTH 2 % EX PADS
6.0000 | MEDICATED_PAD | Freq: Once | CUTANEOUS | Status: AC
  Administered 2020-02-18: 22:00:00 6 via TOPICAL

## 2020-02-18 NOTE — Progress Notes (Signed)
Subjective: 59 year old female was admitted to the hospital for worsening cellulitis and ulceration of the right hallux.  Also found to have infection, left thigh which started to drain today.  She states that she only significant pain to her right foot but she does admit even while in the hospitalization on her foot quite a bit.  Denies any systemic symptoms including current fevers, chills, nausea, vomiting.  No calf pain, chest pain, shortness of breath.   Objective: AAO x3, NAD-husband on the phone DP, PT pulses palpable.  There is still cellulitis present to right hallux extending to the just proximal to the MPJ.  Scab, eschar to the medial aspect of the hallux.  There is mild warmth of the foot.  There is no significant erythema to the other areas of the foot. Small preulcerative area of the plantar left hallux without any ulceration, drainage or any signs of infection. No pain with calf compression, swelling, warmth, erythema  Assessment: Cellulitis, concern for abscess right hallux  Plan: -All treatment options discussed with the patient including all alternatives, risks, complications.  -Arterial studies were performed.  Also MRI was performed which was concerning for abscess of the right hallux.  Her white blood cell count is unchanged today.  She also has a source of infection of the left thigh it started to drain today wound cultures are pending.  However given concern for the abscess in the right hallux I discussed with her incision and drainage, wound debridement.  I discussed amputation of the toe and she became very tearful and upset.  She is not ready to proceed with amputation of the toe.  Discussed we will try to continue to save this but she is still very high risk of amputation.  We will plan for right foot incision drainage, debridement tomorrow.  N.p.o. after midnight.  Continue antibiotics for now.  Celesta Gentile, DPM O: [phone] C: [phone] -Patient encouraged  to call the office with any questions, concerns, change in symptoms.

## 2020-02-18 NOTE — Progress Notes (Signed)
PROGRESS NOTE    Jasmine Richardson  [id number] DOB: May 01, 1961 DOA: 02/16/2020 PCP: Ernestene Kiel, MD    Brief Narrative:  59 year old female with history of coronary artery disease, chronic diastolic heart failure, hypertension, hyperlipidemia, type 2 diabetes uncontrolled presented to the hospital from podiatry surgery office with right great toe wound of about 2 weeks.  Also noted some fever at home.  Also had some pain on left inner thigh. In the emergency room she was found to have leukocytosis.  Started on antibiotics and admitted.   Assessment & Plan:   Principal Problem:   Diabetic foot ulcer ([place]) Active Problems:   Chronic diastolic heart failure (HCC)   Coronary artery disease involving native coronary artery of native heart with angina pectoris ([place])   Essential hypertension   GERD (gastroesophageal reflux disease)   Hyperlipidemia   Morbid obesity (HCC)   S/P CABG (coronary artery bypass graft)   Uncontrolled type 2 diabetes mellitus ([place])  Sepsis secondary to diabetic foot ulcer: Patient has significant localized infection.  Cultures negative so far.  On broad-spectrum antibiotics with Rocephin, vancomycin and Flagyl that we will continue today as she also has some inflammation of her left inner thigh. X-ray without any bony abnormalities. MRI shows a localized abscess and myositis, no osteomyelitis. Followed by podiatry surgery, defer any surgical intervention needed to the surgical team.  Will probably need surgical debridement.  Left upper medial thigh cellulitis/abscess: Small localized induration, is spontaneously drained today. Local culture collected.  Dry dressing.  Type 2 diabetes, uncontrolled with hyperglycemia: A1c more than 11.  Blood sugars more than 200.  She was on insulin.  Will increase doses of insulin and continue to uptitrate to achieve better control.  Anticipating patient will need increased dose of insulin on discharge.  Hypertension:  Blood pressure stable on Toprol and ramipril.  Hyperlipidemia, she is on a statin that she will continue.  Hypomagnesemia: Replace and recheck level tomorrow morning.   DVT prophylaxis: Lovenox Code Status: Full code Family Communication: None Disposition Plan: patient is from home. Anticipated DC to home, Barriers to discharge on IV antibiotics, continues to need IV antibiotics and clinical monitoring.  May need inpatient procedures including surgery to her toes.   Consultants:   Podiatry surgery  Procedures:   None  Antimicrobials:  Anti-infectives (From admission, onward)   Start     Dose/Rate Route Frequency Ordered Stop   02/18/20 1800  vancomycin (VANCOCIN) IVPB 1000 mg/200 mL premix     1,000 mg 200 mL/hr over 60 Minutes Intravenous Every 12 hours 02/18/20 1252     02/17/20 1700  cefTRIAXone (ROCEPHIN) 1 g in sodium chloride 0.9 % 100 mL IVPB  Status:  Discontinued     1 g 200 mL/hr over 30 Minutes Intravenous Every 24 hours 02/16/20 1746 02/17/20 0918   02/17/20 1700  cefTRIAXone (ROCEPHIN) 2 g in sodium chloride 0.9 % 100 mL IVPB     2 g 200 mL/hr over 30 Minutes Intravenous Every 24 hours 02/17/20 0918     02/16/20 2330  vancomycin (VANCOREADY) IVPB 1500 mg/300 mL  Status:  Discontinued     1,500 mg 150 mL/hr over 120 Minutes Intravenous Every 24 hours 02/16/20 2252 02/18/20 1252   02/16/20 1800  metroNIDAZOLE (FLAGYL) IVPB 500 mg     500 mg 100 mL/hr over 60 Minutes Intravenous Every 8 hours 02/16/20 1746     02/16/20 1600  vancomycin (VANCOREADY) IVPB 1500 mg/300 mL  Status:  Discontinued  1,500 mg 150 mL/hr over 120 Minutes Intravenous Every 24 hours 02/16/20 1535 02/16/20 2252   02/16/20 1545  vancomycin (VANCOCIN) IVPB 1000 mg/200 mL premix  Status:  Discontinued     1,000 mg 200 mL/hr over 60 Minutes Intravenous  Once 02/16/20 1530 02/16/20 1535   02/16/20 1545  cefTRIAXone (ROCEPHIN) 2 g in sodium chloride 0.9 % 100 mL IVPB     2 g 200 mL/hr over 30  Minutes Intravenous  Once 02/16/20 1530 02/16/20 1726         Subjective: Seen and examined.  No overnight events.  Right toe remains same. Left thigh pain is improved and has some secretions. At the bedside with chaperone, I examined her left inner thigh, she has less than 2 cm induration, with pinpoint opening and pus expression.  Cultures were collected.  Objective: Vitals:   02/16/20 1900 02/17/20 0500 02/17/20 1500 02/18/20 0900  BP: 136/64 110/73 132/78 132/74  Pulse: 65 83 84 86  Resp: 20 18 17    Temp: (!) 100.7 F (38.2 C) 99.6 F (37.6 C) 98.9 F (37.2 C) 99 F (37.2 C)  TempSrc:   Oral Oral  SpO2: 95% 99% 98% 100%  Weight:      Height:        Intake/Output Summary (Last 24 hours) at 02/18/2020 1336 Last data filed at 02/18/2020 0300 Gross per 24 hour  Intake 1147.44 ml  Output 1 ml  Net 1146.44 ml   Filed Weights   02/16/20 1534  Weight: 104.3 kg    Examination:  General exam: Appears calm and comfortable, on room air. Respiratory system: Clear to auscultation. Respiratory effort normal.  No added sounds. Cardiovascular system: S1 & S2 heard, RRR. No JVD, murmurs, rubs, gallops or clicks.  Gastrointestinal system: Abdomen is nondistended, soft and nontender. No organomegaly or masses felt. Normal bowel sounds heard. Central nervous system: Alert and oriented. No focal neurological deficits. Extremities: Symmetric 5 x 5 power. Skin: No rashes, lesions or ulcers Psychiatry: Judgement and insight appear normal. Mood & affect appropriate.  Examined with female RN at the bedside. Left inner thigh, 2-3 centimeters induration, nontender with firm induration, no fluctuation, overlying skin is normal in color and mobile. A pinpoint opening with pus drainage. Right great toe with open ulcer, muscles exposed with surrounding erythema.   Data Reviewed: I have personally reviewed following labs and imaging studies  CBC: Recent Labs  Lab 02/16/20 1327  02/17/20 0523 02/18/20 0407  WBC 18.5* 15.8* 15.7*  NEUTROABS 15.5*  --  13.0*  HGB 11.1* 10.1* 9.5*  HCT 34.8* 30.4* 28.8*  MCV 89.7 87.1 87.5  PLT 425* 317 [id number]   Basic Metabolic Panel: Recent Labs  Lab 02/16/20 1327 02/17/20 0523 02/18/20 0407  NA 130* 132* 131*  K 4.3 3.8 4.3  CL 91* 96* 99  CO2 25 22 21*  GLUCOSE 578* 383* 282*  BUN 8 6 9   CREATININE 0.83 0.77 0.77  CALCIUM 9.5 8.7* 7.9*  MG  --   --  1.1*  PHOS  --   --  2.4*   GFR: Estimated Creatinine Clearance: 94.1 mL/min (by C-G formula based on SCr of 0.77 mg/dL). Liver Function Tests: Recent Labs  Lab 02/16/20 1327  AST 12*  ALT 11  ALKPHOS 62  BILITOT 0.7  PROT 7.8  ALBUMIN 3.1*   No results for input(s): LIPASE, AMYLASE in the last 168 hours. No results for input(s): AMMONIA in the last 168 hours. Coagulation Profile: Recent Labs  Lab 02/16/20 1327  INR 1.2   Cardiac Enzymes: No results for input(s): CKTOTAL, CKMB, CKMBINDEX, TROPONINI in the last 168 hours. BNP (last 3 results) No results for input(s): PROBNP in the last 8760 hours. HbA1C: Recent Labs    02/16/20 1327  HGBA1C 11.7*   CBG: Recent Labs  Lab 02/17/20 1653 02/17/20 2016 02/18/20 0636 02/18/20 0928 02/18/20 1127  GLUCAP 304* 293* 273* 317* 307*   Lipid Profile: No results for input(s): CHOL, HDL, LDLCALC, TRIG, CHOLHDL, LDLDIRECT in the last 72 hours. Thyroid Function Tests: No results for input(s): TSH, T4TOTAL, FREET4, T3FREE, THYROIDAB in the last 72 hours. Anemia Panel: No results for input(s): VITAMINB12, FOLATE, FERRITIN, TIBC, IRON, RETICCTPCT in the last 72 hours. Sepsis Labs: Recent Labs  Lab 02/16/20 1314 02/16/20 1546  LATICACIDVEN 3.8* 2.4*    Recent Results (from the past 240 hour(s))  Culture, blood (Routine x 2)     Status: None (Preliminary result)   Collection Time: 02/16/20  2:04 PM   Specimen: BLOOD  Result Value Ref Range Status   Specimen Description BLOOD RIGHT ANTECUBITAL  Final    Special Requests   Final    BOTTLES DRAWN AEROBIC AND ANAEROBIC Blood Culture results may not be optimal due to an inadequate volume of blood received in culture bottles   Culture   Final    NO GROWTH 2 DAYS Performed at [facility], [place] [address], [place], [place] [address]    Report Status PENDING  Incomplete  Culture, blood (Routine x 2)     Status: None (Preliminary result)   Collection Time: 02/16/20  3:46 PM   Specimen: BLOOD  Result Value Ref Range Status   Specimen Description BLOOD LEFT ANTECUBITAL  Final   Special Requests   Final    BOTTLES DRAWN AEROBIC AND ANAEROBIC Blood Culture adequate volume   Culture   Final    NO GROWTH 2 DAYS Performed at [facility], [place] [address] [place],  [address]    Report Status PENDING  Incomplete  Respiratory Panel by RT PCR (Flu A&B, Covid) - Nasopharyngeal Swab     Status: None   Collection Time: 02/16/20  3:58 PM   Specimen: Nasopharyngeal Swab  Result Value Ref Range Status   SARS Coronavirus 2 by RT PCR NEGATIVE NEGATIVE Final    Comment: (NOTE) SARS-CoV-2 target nucleic acids are NOT DETECTED. The SARS-CoV-2 RNA is generally detectable in upper respiratoy specimens during the acute phase of infection. The lowest concentration of SARS-CoV-2 viral copies this assay can detect is 131 copies/mL. A negative result does not preclude SARS-Cov-2 infection and should not be used as the sole basis for treatment or other patient management decisions. A negative result may occur with  improper specimen collection/handling, submission of specimen other than nasopharyngeal swab, presence of viral mutation(s) within the areas targeted by this assay, and inadequate number of viral copies (<131 copies/mL). A negative result must be combined with clinical observations, patient history, and epidemiological information. The expected result is Negative. Fact Sheet for Patients:   PinkCheek.be Fact Sheet for Healthcare Providers:  GravelBags.it This test is not yet ap proved or cleared by the [place] FDA and  has been authorized for detection and/or diagnosis of SARS-CoV-2 by FDA under an Emergency Use Authorization (EUA). This EUA will remain  in effect (meaning this test can be used) for the duration of the COVID-19 declaration under Section 564(b)(1) of the Act, 21 U.S.C. section 360bbb-3(b)(1), unless the  authorization is terminated or revoked sooner.    Influenza A by PCR NEGATIVE NEGATIVE Final   Influenza B by PCR NEGATIVE NEGATIVE Final    Comment: (NOTE) The Xpert Xpress SARS-CoV-2/FLU/RSV assay is intended as an aid in  the diagnosis of influenza from Nasopharyngeal swab specimens and  should not be used as a sole basis for treatment. Nasal washings and  aspirates are unacceptable for Xpert Xpress SARS-CoV-2/FLU/RSV  testing. Fact Sheet for Patients: PinkCheek.be Fact Sheet for Healthcare Providers: GravelBags.it This test is not yet approved or cleared by the [place] FDA and  has been authorized for detection and/or diagnosis of SARS-CoV-2 by  FDA under an Emergency Use Authorization (EUA). This EUA will remain  in effect (meaning this test can be used) for the duration of the  Covid-19 declaration under Section 564(b)(1) of the Act, 21  U.S.C. section 360bbb-3(b)(1), unless the authorization is  terminated or revoked. Performed at [facility], [place] [address] [place], [place] [address]   Aerobic Culture (superficial specimen)     Status: None (Preliminary result)   Collection Time: 02/18/20 10:31 AM   Specimen: Abscess  Result Value Ref Range Status   Specimen Description ABSCESS  Final   Special Requests NONE  Final   Gram Stain   Final    RARE WBC PRESENT, PREDOMINANTLY PMN FEW GRAM POSITIVE COCCI IN  PAIRS RARE GRAM NEGATIVE RODS Performed at [facility], [place] [address] [place], [place] [address]    Culture PENDING  Incomplete   Report Status PENDING  Incomplete         Radiology Studies: CT FEMUR LEFT W CONTRAST  Result Date: 02/16/2020 CLINICAL DATA:  Abscess of left thigh.  Fever. EXAM: CT OF THE LOWER RIGHT EXTREMITY WITH CONTRAST TECHNIQUE: Multidetector CT imaging of the lower right extremity was performed according to the standard protocol following intravenous contrast administration. COMPARISON:  None. CONTRAST:  137mL OMNIPAQUE IOHEXOL 300 MG/ML  SOLN FINDINGS: Bones/Joint/Cartilage No acute abnormalities of the left hip or left knee or left femur. Slight arthritic changes of the left knee. Minimal left knee effusion. Ligaments Suboptimally assessed by CT. The ligaments of the left knee appear intact. Muscles and Tendons There is soft tissue stranding superficial to the proximal left gracilis muscle. There is thickening of the skin overlying this area. There is no definable abscess. There is no discrete myositis. The other muscles of the visualized portion of the left leg appear normal. Soft tissues Skin thickening and soft tissue stranding in the subcutaneous fat of the medial aspect of the proximal left thigh consistent with cellulitis. No definable abscess. Prominent varicose veins in the medial aspect of the left thigh. CLINICAL DATA:  Abscess of left thigh. Fever. EXAM: CT OF THE LOWER RIGHT EXTREMITY WITH CONTRAST TECHNIQUE: Multidetector CT imaging of the lower right extremity was performed according to the standard protocol following intravenous contrast administration. COMPARISON:  None. CONTRAST:  158mL OMNIPAQUE IOHEXOL 300 MG/ML  SOLN FINDINGS: Bones/Joint/Cartilage No acute abnormalities of the left hip or left knee or left femur. Slight arthritic changes of the left knee. Minimal left knee effusion. Ligaments Suboptimally assessed by CT. The ligaments of the left  knee appear intact. Muscles and Tendons There is soft tissue stranding superficial to the proximal left gracilis muscle. There is thickening of the skin overlying this area. There is no definable abscess. There is no discrete myositis. The other muscles of the visualized portion of the left leg appear normal. Soft  tissues Skin thickening and soft tissue stranding in the subcutaneous fat of the medial aspect of the proximal left thigh consistent with cellulitis. No definable abscess. Prominent varicose veins in the medial aspect of the left thigh. IMPRESSION: 1. Findings consistent with cellulitis of the medial aspect of the proximal left thigh. This area extends from the level of the symphysis pubis of approximately 15 cm distally superficial to the gracilis muscle. 2. No definable abscess. 3. No evidence of myositis or osteomyelitis. 4. Minimal left knee effusion. 5. Prominent varicose veins in the medial aspect of the left thigh. Electronically Signed   By: Lorriane Shire M.D.   On: 02/16/2020 19:12   MR TOES RIGHT WO CONTRAST  Result Date: 02/18/2020 CLINICAL DATA:  The patient noticed a blister on her right great toe 2 weeks ago. Pain and swelling. EXAM: MRI OF THE RIGHT TOES WITHOUT CONTRAST TECHNIQUE: Multiplanar, multisequence MR imaging of the right toes was performed. No intravenous contrast was administered. COMPARISON:  Plain films of the right foot 02/16/2020. FINDINGS: Bones/Joint/Cartilage Patient motion degrades the examination despite repeating sequences and using motion reduction techniques. There is no bone marrow signal abnormality to suggest osteomyelitis. No joint effusion. No evidence of arthropathy. Ligaments Intact. Muscles and Tendons No intramuscular fluid collection. Intermediate increased T2 signal in all intrinsic musculature the foot is most consistent with diabetic myopathy. Soft tissues Skin wound is seen at the level of the IP joint of the great toe on the medial side. Underlying  subcutaneous edema is present. A thin fluid collection along the plantar surface of the distal phalanx measures 1 cm transverse by 0.3 cm craniocaudal by 1.2 cm long. The collection is contiguous with the underlying distal phalanx. IMPRESSION: Motion degraded examination. Skin ulceration at the IP joint of the great toe. There is no evidence of osteomyelitis but a thin fluid collection contiguous with the undersurface of the distal phalanx is worrisome for abscess. Electronically Signed   By: Inge Rise M.D.   On: 02/18/2020 08:34   DG Foot Complete Right  Result Date: 02/16/2020 CLINICAL DATA:  Right foot wound. EXAM: RIGHT FOOT COMPLETE - 3+ VIEW COMPARISON:  None. FINDINGS: There is no evidence of fracture or dislocation. There is no evidence of arthropathy or other focal bone abnormality. Plantar and Achilles enthesophytes. Osteopenia. Soft tissues are unremarkable. IMPRESSION: No acute osseous abnormality. Electronically Signed   By: Titus Dubin M.D.   On: 02/16/2020 15:17   VAS [place] ABI WITH/WO TBI  Result Date: 02/18/2020 LOWER EXTREMITY DOPPLER STUDY Indications: Ulceration, and PVD. High Risk         Hypertension, hyperlipidemia, Diabetes, coronary artery Factors:          disease.  Comparison Study: no prior Performing Technologist: Abram Sander RVS  Examination Guidelines: A complete evaluation includes at minimum, Doppler waveform signals and systolic blood pressure reading at the level of bilateral brachial, anterior tibial, and posterior tibial arteries, when vessel segments are accessible. Bilateral testing is considered an integral part of a complete examination. Photoelectric Plethysmograph (PPG) waveforms and toe systolic pressure readings are included as required and additional duplex testing as needed. Limited examinations for reoccurring indications may be performed as noted.  ABI Findings: +--------+------------------+-----+---------+--------+ Right   Rt Pressure  (mmHg)IndexWaveform Comment  +--------+------------------+-----+---------+--------+ [id number]                    triphasic         +--------+------------------+-----+---------+--------+ PTA     128  0.90 biphasic          +--------+------------------+-----+---------+--------+ DP      129               0.91 triphasic         +--------+------------------+-----+---------+--------+ +--------+------------------+-----+---------+-------+ Left    Lt Pressure (mmHg)IndexWaveform Comment +--------+------------------+-----+---------+-------+ Brachial130                    biphasic         +--------+------------------+-----+---------+-------+ PTA     141               0.99 biphasic         +--------+------------------+-----+---------+-------+ DP      135               0.95 triphasic        +--------+------------------+-----+---------+-------+ +-------+-----------+-----------+------------+------------+ ABI/TBIToday's ABIToday's TBIPrevious ABIPrevious TBI +-------+-----------+-----------+------------+------------+ Right  0.91                                           +-------+-----------+-----------+------------+------------+ Left   0.99                                           +-------+-----------+-----------+------------+------------+  Summary: Right: Resting right ankle-brachial index indicates mild right lower extremity arterial disease. Left: Resting left ankle-brachial index is within normal range. No evidence of significant left lower extremity arterial disease.  *See table(s) above for measurements and observations.  Electronically signed by Servando Snare MD on 02/18/2020 at 1:17:10 PM.   Final         Scheduled Meds: . clopidogrel  75 mg Oral Daily  . enoxaparin (LOVENOX) injection  50 mg Subcutaneous Q24H  . furosemide  40 mg Oral Daily  . insulin aspart  0-20 Units Subcutaneous TID WC  . insulin aspart  0-5 Units Subcutaneous QHS   . insulin aspart  8 Units Subcutaneous TID WC  . insulin glargine  25 Units Subcutaneous QHS  . living well with diabetes book   Does not apply Once  . magnesium oxide  400 mg Oral BID  . metoprolol succinate  150 mg Oral Daily  . pantoprazole  40 mg Oral Daily  . pravastatin  20 mg Oral QPM  . ramipril  1.25 mg Oral Daily  . sodium chloride flush  3 mL Intravenous Q12H   Continuous Infusions: . sodium chloride    . cefTRIAXone (ROCEPHIN)  IV 2 g (02/17/20 1751)  . metronidazole 500 mg (02/18/20 0910)  . sodium chloride    . vancomycin       LOS: 2 days    Time spent: 30 minutes    Barb Merino, MD [facility] Pager [phone]

## 2020-02-18 NOTE — Progress Notes (Signed)
Pharmacy Antibiotic Note  Jasmine Richardson is a 59 y.o. female admitted on 02/16/2020 with cellulitis / diabetic foot ulcer.  Pharmacy has been consulted for vancomycin dosing. She is also on ceftriaxone and flagyl. MRI of foot negative for osteomyelitis but concerning for abscess. Will increase vancomycin for a more aggressive dose since renal fx is wnl. WBC has improved since admission   Plan: Increase vancomycin to 1 gm IV Q 12 hours  Goal AUC 400-550. Expected AUC: 518 - based off AdjBW, 0.5 L/kg SCr used: 0.83  Height: 5\' 7"  (170.2 cm) Weight: 230 lb (104.3 kg) IBW/kg (Calculated) : 61.6  Temp (24hrs), Avg:99.3 F (37.4 C), Min:98.9 F (37.2 C), Max:100.4 F (38 C)  Recent Labs  Lab 02/16/20 1314 02/16/20 1327 02/16/20 1546 02/17/20 0523 02/18/20 0407  WBC  --  18.5*  --  15.8* 15.7*  CREATININE  --  0.83  --  0.77 0.77  LATICACIDVEN 3.8*  --  2.4*  --   --     Estimated Creatinine Clearance: 94.1 mL/min (by C-G formula based on SCr of 0.77 mg/dL).    Allergies  Allergen Reactions  . Codeine Rash    Antimicrobials this admission: Vanc 3/2>> CTX x 1 3/2  Dose adjustments this admission: N/A  Microbiology results: 3/2 BC x 2 > NGTD  Thank you for allowing pharmacy to be a part of this patient's care.  Albertina Parr, PharmD., BCPS Clinical Pharmacist Clinical phone for 02/18/20 until 5pm: [phone]

## 2020-02-18 NOTE — Plan of Care (Signed)
  Problem: Education: Goal: Knowledge of General Education information will improve Description Including pain rating scale, medication(s)/side effects and non-pharmacologic comfort measures Outcome: Progressing   

## 2020-02-18 NOTE — Progress Notes (Signed)
Inpatient Diabetes Program Recommendations  AACE/ADA: New Consensus Statement on Inpatient Glycemic Control (2015)  Target Ranges:  Prepandial:   less than 140 mg/dL      Peak postprandial:   less than 180 mg/dL (1-2 hours)      Critically ill patients:  140 - 180 mg/dL   Lab Results  Component Value Date   GLUCAP 317 (H) 02/18/2020   HGBA1C 11.7 (H) 02/16/2020    Review of Glycemic Control Results for LEITHA, MANCHEGO (MRN [id number]) as of 02/18/2020 09:58  Ref. Range 02/17/2020 11:59 02/17/2020 16:53 02/17/2020 20:16 02/18/2020 06:36 02/18/2020 09:28  Glucose-Capillary Latest Ref Range: 70 - 99 mg/dL 323 (H) 304 (H) 293 (H) 273 (H) 317 (H)   Diabetes history: DM 2 Outpatient Diabetes medications:  Basaglar 10 units daily Glipizide 10 mg bid Januvia 100 mg daily Metformin XR 1000 mg bid Current orders for Inpatient glycemic control:  Lantus 25 units q HS Novolog 8 units tid with meals Novolog resistant tid with meals and HS  Inpatient Diabetes Program Recommendations:     Please consider increasing Lantus to 35 units q HS.   Thanks, Adah Perl, RN, BC-ADM Inpatient Diabetes Coordinator Pager [phone] (8a-5p)

## 2020-02-18 NOTE — Progress Notes (Signed)
ABI has been completed.   Preliminary results in CV Proc.   Jasmine Richardson 02/18/2020 11:02 AM

## 2020-02-19 ENCOUNTER — Encounter (HOSPITAL_COMMUNITY)
Admission: EM | Disposition: A | Discharge status: Home / Self Care | Payer: Self-pay | Source: Ambulatory Visit | Attending: Internal Medicine

## 2020-02-19 ENCOUNTER — Inpatient Hospital Stay (HOSPITAL_COMMUNITY): Payer: Commercial Managed Care - PPO | Admitting: Certified Registered Nurse Anesthetist

## 2020-02-19 ENCOUNTER — Encounter (HOSPITAL_COMMUNITY): Payer: Self-pay | Admitting: Internal Medicine

## 2020-02-19 DIAGNOSIS — L02611 Cutaneous abscess of right foot: Secondary | ICD-10-CM

## 2020-02-19 HISTORY — PX: INCISION AND DRAINAGE: SHX5863

## 2020-02-19 LAB — CBC WITH DIFFERENTIAL/PLATELET
Abs Immature Granulocytes: 0.1 10*3/uL — ABNORMAL HIGH (ref 0.00–0.07)
Basophils Absolute: 0 10*3/uL (ref 0.0–0.1)
Basophils Relative: 0 %
Eosinophils Absolute: 0.1 10*3/uL (ref 0.0–0.5)
Eosinophils Relative: 1 %
HCT: 29.3 % — ABNORMAL LOW (ref 36.0–46.0)
Hemoglobin: 9.5 g/dL — ABNORMAL LOW (ref 12.0–15.0)
Immature Granulocytes: 1 %
Lymphocytes Relative: 9 %
Lymphs Abs: 1.1 10*3/uL (ref 0.7–4.0)
MCH: 28 pg (ref 26.0–34.0)
MCHC: 32.4 g/dL (ref 30.0–36.0)
MCV: 86.4 fL (ref 80.0–100.0)
Monocytes Absolute: 0.8 10*3/uL (ref 0.1–1.0)
Monocytes Relative: 7 %
Neutro Abs: 9.4 10*3/uL — ABNORMAL HIGH (ref 1.7–7.7)
Neutrophils Relative %: 82 %
Platelets: 391 10*3/uL (ref 150–400)
RBC: 3.39 MIL/uL — ABNORMAL LOW (ref 3.87–5.11)
RDW: 12.3 % (ref 11.5–15.5)
WBC: 11.5 10*3/uL — ABNORMAL HIGH (ref 4.0–10.5)
nRBC: 0 % (ref 0.0–0.2)

## 2020-02-19 LAB — GLUCOSE, CAPILLARY
Glucose-Capillary: 285 mg/dL — ABNORMAL HIGH (ref 70–99)
Glucose-Capillary: 282 mg/dL — ABNORMAL HIGH (ref 70–99)
Glucose-Capillary: 289 mg/dL — ABNORMAL HIGH (ref 70–99)
Glucose-Capillary: 270 mg/dL — ABNORMAL HIGH (ref 70–99)
Glucose-Capillary: 276 mg/dL — ABNORMAL HIGH (ref 70–99)
Glucose-Capillary: 275 mg/dL — ABNORMAL HIGH (ref 70–99)

## 2020-02-19 LAB — MAGNESIUM: Magnesium: 1.6 mg/dL — ABNORMAL LOW (ref 1.7–2.4)

## 2020-02-19 LAB — BASIC METABOLIC PANEL
Anion gap: 10 (ref 5–15)
BUN: 5 mg/dL — ABNORMAL LOW (ref 6–20)
CO2: 25 mmol/L (ref 22–32)
Calcium: 8 mg/dL — ABNORMAL LOW (ref 8.9–10.3)
Chloride: 98 mmol/L (ref 98–111)
Creatinine, Ser: 0.68 mg/dL (ref 0.44–1.00)
GFR calc Af Amer: >60 mL/min (ref >60)
GFR calc non Af Amer: >60 mL/min (ref >60)
Glucose, Bld: 290 mg/dL — ABNORMAL HIGH (ref 70–99)
Potassium: 3.3 mmol/L — ABNORMAL LOW (ref 3.5–5.1)
Sodium: 133 mmol/L — ABNORMAL LOW (ref 135–145)

## 2020-02-19 LAB — MRSA PCR SCREENING: MRSA by PCR: NEGATIVE

## 2020-02-19 SURGERY — INCISION AND DRAINAGE
Anesthesia: General | Laterality: Right

## 2020-02-19 MED ORDER — MIDAZOLAM HCL 2 MG/2ML IJ SOLN
INTRAMUSCULAR | Status: AC
  Filled 2020-02-19: qty 2

## 2020-02-19 MED ORDER — DEXMEDETOMIDINE HCL 200 MCG/2ML IV SOLN
INTRAVENOUS | Status: DC | PRN
  Administered 2020-02-19: 8 ug via INTRAVENOUS
  Administered 2020-02-19: 4 ug via INTRAVENOUS

## 2020-02-19 MED ORDER — CHLORHEXIDINE GLUCONATE CLOTH 2 % EX PADS
6.0000 | MEDICATED_PAD | Freq: Once | CUTANEOUS | Status: AC
  Administered 2020-02-19: 20:00:00 6 via TOPICAL

## 2020-02-19 MED ORDER — 0.9 % SODIUM CHLORIDE (POUR BTL) OPTIME
TOPICAL | Status: DC | PRN
  Administered 2020-02-19: 1000 mL

## 2020-02-19 MED ORDER — LACTATED RINGERS IV SOLN: INTRAVENOUS | Status: DC

## 2020-02-19 MED ORDER — LIDOCAINE HCL 2 % IJ SOLN
INTRAMUSCULAR | Status: DC | PRN
  Administered 2020-02-19: 12 mL

## 2020-02-19 MED ORDER — SUCCINYLCHOLINE CHLORIDE 200 MG/10ML IV SOSY
PREFILLED_SYRINGE | INTRAVENOUS | Status: AC
  Filled 2020-02-19: qty 10

## 2020-02-19 MED ORDER — CHLORHEXIDINE GLUCONATE CLOTH 2 % EX PADS
6.0000 | MEDICATED_PAD | Freq: Once | CUTANEOUS | Status: AC
  Administered 2020-02-19: 6 via TOPICAL

## 2020-02-19 MED ORDER — BUPIVACAINE HCL (PF) 0.5 % IJ SOLN
INTRAMUSCULAR | Status: DC | PRN
  Administered 2020-02-19: 12 mL

## 2020-02-19 MED ORDER — BUPIVACAINE HCL (PF) 0.5 % IJ SOLN
INTRAMUSCULAR | Status: AC
  Filled 2020-02-19: qty 30

## 2020-02-19 MED ORDER — POTASSIUM CHLORIDE CRYS ER 20 MEQ PO TBCR
40.0000 meq | EXTENDED_RELEASE_TABLET | Freq: Two times a day (BID) | ORAL | Status: AC
  Administered 2020-02-19 – 2020-02-20 (×4): 40 meq via ORAL
  Filled 2020-02-19 (×4): qty 2

## 2020-02-19 MED ORDER — DEXAMETHASONE SODIUM PHOSPHATE 10 MG/ML IJ SOLN
INTRAMUSCULAR | Status: AC
  Filled 2020-02-19: qty 1

## 2020-02-19 MED ORDER — INSULIN ASPART 100 UNIT/ML ~~LOC~~ SOLN
10.0000 [IU] | Freq: Three times a day (TID) | SUBCUTANEOUS | Status: DC
  Administered 2020-02-19 – 2020-02-21 (×4): 10 [IU] via SUBCUTANEOUS

## 2020-02-19 MED ORDER — LIDOCAINE 2% (20 MG/ML) 5 ML SYRINGE
INTRAMUSCULAR | Status: DC | PRN
  Administered 2020-02-19: 40 mg via INTRAVENOUS

## 2020-02-19 MED ORDER — FENTANYL CITRATE (PF) 250 MCG/5ML IJ SOLN
INTRAMUSCULAR | Status: AC
  Filled 2020-02-19: qty 5

## 2020-02-19 MED ORDER — PROPOFOL 500 MG/50ML IV EMUL
INTRAVENOUS | Status: DC | PRN
  Administered 2020-02-19: 40 ug/kg/min via INTRAVENOUS

## 2020-02-19 MED ORDER — PROPOFOL 10 MG/ML IV BOLUS
INTRAVENOUS | Status: AC
  Filled 2020-02-19: qty 20

## 2020-02-19 MED ORDER — FENTANYL CITRATE (PF) 100 MCG/2ML IJ SOLN: 25.0000 ug | INTRAMUSCULAR | Status: DC | PRN

## 2020-02-19 MED ORDER — ONDANSETRON HCL 4 MG/2ML IJ SOLN: 4.0000 mg | Freq: Once | INTRAMUSCULAR | Status: DC | PRN

## 2020-02-19 MED ORDER — LIDOCAINE HCL 2 % IJ SOLN
INTRAMUSCULAR | Status: AC
  Filled 2020-02-19: qty 20

## 2020-02-19 MED ORDER — MIDAZOLAM HCL 2 MG/2ML IJ SOLN
INTRAMUSCULAR | Status: DC | PRN
  Administered 2020-02-19 (×2): 1 mg via INTRAVENOUS

## 2020-02-19 MED ORDER — MAGNESIUM SULFATE 2 GM/50ML IV SOLN
2.0000 g | Freq: Once | INTRAVENOUS | Status: AC
  Administered 2020-02-19: 12:00:00 2 g via INTRAVENOUS
  Filled 2020-02-19: qty 50

## 2020-02-19 MED ORDER — ONDANSETRON HCL 4 MG/2ML IJ SOLN
INTRAMUSCULAR | Status: AC
  Filled 2020-02-19: qty 2

## 2020-02-19 MED ORDER — LIDOCAINE 2% (20 MG/ML) 5 ML SYRINGE
INTRAMUSCULAR | Status: AC
  Filled 2020-02-19: qty 5

## 2020-02-19 MED ORDER — INSULIN GLARGINE 100 UNIT/ML ~~LOC~~ SOLN
45.0000 [IU] | Freq: Every day | SUBCUTANEOUS | Status: DC
  Administered 2020-02-19 – 2020-02-20 (×2): 45 [IU] via SUBCUTANEOUS
  Filled 2020-02-19 (×3): qty 0.45

## 2020-02-19 SURGICAL SUPPLY — 36 items
BLADE LONG MED 31X9 (MISCELLANEOUS) IMPLANT
BNDG CMPR 9X4 STRL LF SNTH (GAUZE/BANDAGES/DRESSINGS)
BNDG CONFORM 2 STRL LF (GAUZE/BANDAGES/DRESSINGS) ×1 IMPLANT
BNDG ELASTIC 3X5.8 VLCR STR LF (GAUZE/BANDAGES/DRESSINGS) ×2 IMPLANT
BNDG ESMARK 4X9 LF (GAUZE/BANDAGES/DRESSINGS) ×1 IMPLANT
BNDG GAUZE ELAST 4 BULKY (GAUZE/BANDAGES/DRESSINGS) ×2 IMPLANT
COVER WAND RF STERILE (DRAPES) ×1 IMPLANT
CUFF TOURN SGL QUICK 18X4 (TOURNIQUET CUFF) ×1 IMPLANT
DRSG EMULSION OIL 3X3 NADH (GAUZE/BANDAGES/DRESSINGS) ×1 IMPLANT
DURAPREP 26ML APPLICATOR (WOUND CARE) ×1 IMPLANT
ELECT REM PT RETURN 9FT ADLT (ELECTROSURGICAL) ×2
ELECTRODE REM PT RTRN 9FT ADLT (ELECTROSURGICAL) ×1 IMPLANT
GAUZE SPONGE 4X4 12PLY STRL (GAUZE/BANDAGES/DRESSINGS) ×2 IMPLANT
GAUZE XEROFORM 1X8 LF (GAUZE/BANDAGES/DRESSINGS) ×1 IMPLANT
GLOVE BIO SURGEON STRL SZ8 (GLOVE) ×4 IMPLANT
GOWN STRL REUS W/ TWL LRG LVL3 (GOWN DISPOSABLE) ×1 IMPLANT
GOWN STRL REUS W/ TWL XL LVL3 (GOWN DISPOSABLE) ×1 IMPLANT
GOWN STRL REUS W/TWL LRG LVL3 (GOWN DISPOSABLE) ×2
GOWN STRL REUS W/TWL XL LVL3 (GOWN DISPOSABLE) ×2
KIT BASIN OR (CUSTOM PROCEDURE TRAY) ×2 IMPLANT
NDL 18GX1X1/2 (RX/OR ONLY) (NEEDLE) IMPLANT
NDL HYPO 25X1 1.5 SAFETY (NEEDLE) ×1 IMPLANT
NEEDLE 18GX1X1/2 (RX/OR ONLY) (NEEDLE) IMPLANT
NEEDLE HYPO 25X1 1.5 SAFETY (NEEDLE) ×6 IMPLANT
NS IRRIG 1000ML POUR BTL (IV SOLUTION) ×1 IMPLANT
PACK ORTHO EXTREMITY (CUSTOM PROCEDURE TRAY) ×2 IMPLANT
PADDING CAST ABS 4INX4YD NS (CAST SUPPLIES) ×1
PADDING CAST ABS COTTON 4X4 ST (CAST SUPPLIES) ×1 IMPLANT
SUT MNCRL AB 3-0 PS2 18 (SUTURE) ×1 IMPLANT
SUT MNCRL AB 4-0 PS2 18 (SUTURE) IMPLANT
SUT MON AB 5-0 PS2 18 (SUTURE) ×1 IMPLANT
SUT PROLENE 4 0 PS 2 18 (SUTURE) ×1 IMPLANT
SWAB COLLECTION DEVICE MRSA (MISCELLANEOUS) ×2 IMPLANT
SWAB CULTURE ESWAB REG 1ML (MISCELLANEOUS) ×2 IMPLANT
SYR 10ML LL (SYRINGE) ×6 IMPLANT
UNDERPAD 30X30 (UNDERPADS AND DIAPERS) ×2 IMPLANT

## 2020-02-19 NOTE — Progress Notes (Signed)
PROGRESS NOTE    Jasmine Richardson  [id number] DOB: 18-May-1961 DOA: 02/16/2020 PCP: Ernestene Kiel, MD    Brief Narrative:  59 year old female with history of coronary artery disease, chronic diastolic heart failure, hypertension, hyperlipidemia, type 2 diabetes uncontrolled presented to the hospital from podiatry surgery office with right great toe wound of about 2 weeks.  Also noted some fever at home.  Also had some pain on left inner thigh. In the emergency room she was found to have leukocytosis.  Started on antibiotics and admitted.   Assessment & Plan:   Principal Problem:   Diabetic foot ulcer ([place]) Active Problems:   Chronic diastolic heart failure (HCC)   Coronary artery disease involving native coronary artery of native heart with angina pectoris (HCC)   Essential hypertension   GERD (gastroesophageal reflux disease)   Hyperlipidemia   Morbid obesity (HCC)   S/P CABG (coronary artery bypass graft)   Uncontrolled type 2 diabetes mellitus ([place])  Sepsis secondary to diabetic foot ulcer: Patient with significant localized infection and soft tissue aspirate. Blood cultures negative.  Abscess culture from thigh pending but taken after antibiotics. On broad-spectrum antibiotics with Rocephin, vancomycin and Flagyl that we will continue today and she is going for surgery/debridement. MRI shows a localized abscess and myositis, no osteomyelitis. Patient for debridement/amputation today.  If adequate clinical improvement, will be able to change to oral antibiotics on discharge as she does not have bone involvement.  Left upper medial thigh cellulitis/abscess: Small localized induration, spontaneously drained.  Cultures pending.  Diet dressing and hot compress.  Type 2 diabetes, uncontrolled with hyperglycemia: A1c more than 11.  Blood sugar still uncontrolled.   Continue to increase insulin coverage.  Will increase to Lantus to 45 units at night, increase prandial insulin to  10 units 3 times a day.    Hypertension: Blood pressure stable on Toprol and ramipril.  Hyperlipidemia, she is on a statin that she will continue.  Hypomagnesemia: Replaced, replace further . Replace potassium.   DVT prophylaxis: Lovenox Code Status: Full code Family Communication: None Disposition Plan: patient is from home. Anticipated DC to home, Barriers to discharge on IV antibiotics, continues to need IV antibiotics and clinical monitoring.  Going for surgery today.    Consultants:   Podiatry surgery  Procedures:   None  Antimicrobials:  Anti-infectives (From admission, onward)   Start     Dose/Rate Route Frequency Ordered Stop   02/18/20 1800  vancomycin (VANCOCIN) IVPB 1000 mg/200 mL premix     1,000 mg 200 mL/hr over 60 Minutes Intravenous Every 12 hours 02/18/20 1252     02/17/20 1700  cefTRIAXone (ROCEPHIN) 1 g in sodium chloride 0.9 % 100 mL IVPB  Status:  Discontinued     1 g 200 mL/hr over 30 Minutes Intravenous Every 24 hours 02/16/20 1746 02/17/20 0918   02/17/20 1700  cefTRIAXone (ROCEPHIN) 2 g in sodium chloride 0.9 % 100 mL IVPB     2 g 200 mL/hr over 30 Minutes Intravenous Every 24 hours 02/17/20 0918     02/16/20 2330  vancomycin (VANCOREADY) IVPB 1500 mg/300 mL  Status:  Discontinued     1,500 mg 150 mL/hr over 120 Minutes Intravenous Every 24 hours 02/16/20 2252 02/18/20 1252   02/16/20 1800  metroNIDAZOLE (FLAGYL) IVPB 500 mg     500 mg 100 mL/hr over 60 Minutes Intravenous Every 8 hours 02/16/20 1746     02/16/20 1600  vancomycin (VANCOREADY) IVPB 1500 mg/300 mL  Status:  Discontinued  1,500 mg 150 mL/hr over 120 Minutes Intravenous Every 24 hours 02/16/20 1535 02/16/20 2252   02/16/20 1545  vancomycin (VANCOCIN) IVPB 1000 mg/200 mL premix  Status:  Discontinued     1,000 mg 200 mL/hr over 60 Minutes Intravenous  Once 02/16/20 1530 02/16/20 1535   02/16/20 1545  cefTRIAXone (ROCEPHIN) 2 g in sodium chloride 0.9 % 100 mL IVPB     2 g 200  mL/hr over 30 Minutes Intravenous  Once 02/16/20 1530 02/16/20 1726         Subjective: Seen and examined no overnight events.  Afebrile.  Objective: Vitals:   02/18/20 0900 02/18/20 1413 02/18/20 2014 02/19/20 0846  BP: 132/74 128/68 135/80 120/70  Pulse: 86 78 86 80  Resp:  18 18   Temp: 99 F (37.2 C) 99.1 F (37.3 C) 98.5 F (36.9 C) 98.5 F (36.9 C)  TempSrc: Oral  Oral Oral  SpO2: 100% 92% 98% 96%  Weight:      Height:        Intake/Output Summary (Last 24 hours) at 02/19/2020 1101 Last data filed at 02/19/2020 0300 Gross per 24 hour  Intake 848.59 ml  Output --  Net 848.59 ml   Filed Weights   02/16/20 1534  Weight: 104.3 kg    Examination:  General exam: Appears calm and comfortable, on room air. Respiratory system: Clear to auscultation. Respiratory effort normal.  No added sounds. Cardiovascular system: S1 & S2 heard, RRR. No JVD, murmurs, rubs, gallops or clicks.  Gastrointestinal system: Abdomen is nondistended, soft and nontender. No organomegaly or masses felt. Normal bowel sounds heard. Central nervous system: Alert and oriented. No focal neurological deficits. Extremities: Symmetric 5 x 5 power. Skin: No rashes, lesions or ulcers Psychiatry: Judgement and insight appear normal. Mood & affect appropriate.  Left thigh, decreased induration and size of the swelling, minimal drainage.  Nontender. Right great toe with open ulcer, muscles exposed with surrounding erythema.   Data Reviewed: I have personally reviewed following labs and imaging studies  CBC: Recent Labs  Lab 02/16/20 1327 02/17/20 0523 02/18/20 0407 02/19/20 0510  WBC 18.5* 15.8* 15.7* 11.5*  NEUTROABS 15.5*  --  13.0* 9.4*  HGB 11.1* 10.1* 9.5* 9.5*  HCT 34.8* 30.4* 28.8* 29.3*  MCV 89.7 87.1 87.5 86.4  PLT 425* 317 367 0000000   Basic Metabolic Panel: Recent Labs  Lab 02/16/20 1327 02/17/20 0523 02/18/20 0407 02/19/20 0510  NA 130* 132* 131* 133*  K 4.3 3.8 4.3 3.3*  CL  91* 96* 99 98  CO2 25 22 21* 25  GLUCOSE 578* 383* 282* 290*  BUN 8 6 9  5*  CREATININE 0.83 0.77 0.77 0.68  CALCIUM 9.5 8.7* 7.9* 8.0*  MG  --   --  1.1* 1.6*  PHOS  --   --  2.4*  --    GFR: Estimated Creatinine Clearance: 94.1 mL/min (by C-G formula based on SCr of 0.68 mg/dL). Liver Function Tests: Recent Labs  Lab 02/16/20 1327  AST 12*  ALT 11  ALKPHOS 62  BILITOT 0.7  PROT 7.8  ALBUMIN 3.1*   No results for input(s): LIPASE, AMYLASE in the last 168 hours. No results for input(s): AMMONIA in the last 168 hours. Coagulation Profile: Recent Labs  Lab 02/16/20 1327  INR 1.2   Cardiac Enzymes: No results for input(s): CKTOTAL, CKMB, CKMBINDEX, TROPONINI in the last 168 hours. BNP (last 3 results) No results for input(s): PROBNP in the last 8760 hours. HbA1C: Recent Labs  02/16/20 1327  HGBA1C 11.7*   CBG: Recent Labs  Lab 02/18/20 0928 02/18/20 1127 02/18/20 1718 02/18/20 2050 02/19/20 0648  GLUCAP 317* 307* 245* 358* 285*   Lipid Profile: No results for input(s): CHOL, HDL, LDLCALC, TRIG, CHOLHDL, LDLDIRECT in the last 72 hours. Thyroid Function Tests: No results for input(s): TSH, T4TOTAL, FREET4, T3FREE, THYROIDAB in the last 72 hours. Anemia Panel: No results for input(s): VITAMINB12, FOLATE, FERRITIN, TIBC, IRON, RETICCTPCT in the last 72 hours. Sepsis Labs: Recent Labs  Lab 02/16/20 1314 02/16/20 1546  LATICACIDVEN 3.8* 2.4*    Recent Results (from the past 240 hour(s))  Culture, blood (Routine x 2)     Status: None (Preliminary result)   Collection Time: 02/16/20  2:04 PM   Specimen: BLOOD  Result Value Ref Range Status   Specimen Description BLOOD RIGHT ANTECUBITAL  Final   Special Requests   Final    BOTTLES DRAWN AEROBIC AND ANAEROBIC Blood Culture results may not be optimal due to an inadequate volume of blood received in culture bottles   Culture   Final    NO GROWTH 2 DAYS Performed at [facility], [place] [address]  [place], [place] [address]    Report Status PENDING  Incomplete  Culture, blood (Routine x 2)     Status: None (Preliminary result)   Collection Time: 02/16/20  3:46 PM   Specimen: BLOOD  Result Value Ref Range Status   Specimen Description BLOOD LEFT ANTECUBITAL  Final   Special Requests   Final    BOTTLES DRAWN AEROBIC AND ANAEROBIC Blood Culture adequate volume   Culture   Final    NO GROWTH 2 DAYS Performed at [facility], [place] [address], [place], [place] [address]    Report Status PENDING  Incomplete  Respiratory Panel by RT PCR (Flu A&B, Covid) - Nasopharyngeal Swab     Status: None   Collection Time: 02/16/20  3:58 PM   Specimen: Nasopharyngeal Swab  Result Value Ref Range Status   SARS Coronavirus 2 by RT PCR NEGATIVE NEGATIVE Final    Comment: (NOTE) SARS-CoV-2 target nucleic acids are NOT DETECTED. The SARS-CoV-2 RNA is generally detectable in upper respiratoy specimens during the acute phase of infection. The lowest concentration of SARS-CoV-2 viral copies this assay can detect is 131 copies/mL. A negative result does not preclude SARS-Cov-2 infection and should not be used as the sole basis for treatment or other patient management decisions. A negative result may occur with  improper specimen collection/handling, submission of specimen other than nasopharyngeal swab, presence of viral mutation(s) within the areas targeted by this assay, and inadequate number of viral copies (<131 copies/mL). A negative result must be combined with clinical observations, patient history, and epidemiological information. The expected result is Negative. Fact Sheet for Patients:  PinkCheek.be Fact Sheet for Healthcare Providers:  GravelBags.it This test is not yet ap proved or cleared by the [place] FDA and  has been authorized for detection and/or diagnosis of SARS-CoV-2 by FDA under an Emergency Use Authorization  (EUA). This EUA will remain  in effect (meaning this test can be used) for the duration of the COVID-19 declaration under Section 564(b)(1) of the Act, 21 U.S.C. section 360bbb-3(b)(1), unless the authorization is terminated or revoked sooner.    Influenza A by PCR NEGATIVE NEGATIVE Final   Influenza B by PCR NEGATIVE NEGATIVE Final    Comment: (NOTE) The Xpert Xpress SARS-CoV-2/FLU/RSV assay is intended as an aid in  the  diagnosis of influenza from Nasopharyngeal swab specimens and  should not be used as a sole basis for treatment. Nasal washings and  aspirates are unacceptable for Xpert Xpress SARS-CoV-2/FLU/RSV  testing. Fact Sheet for Patients: PinkCheek.be Fact Sheet for Healthcare Providers: GravelBags.it This test is not yet approved or cleared by the [place] FDA and  has been authorized for detection and/or diagnosis of SARS-CoV-2 by  FDA under an Emergency Use Authorization (EUA). This EUA will remain  in effect (meaning this test can be used) for the duration of the  Covid-19 declaration under Section 564(b)(1) of the Act, 21  U.S.C. section 360bbb-3(b)(1), unless the authorization is  terminated or revoked. Performed at [facility], [place] [address] [place], [place] [address]   Aerobic Culture (superficial specimen)     Status: None (Preliminary result)   Collection Time: 02/18/20 10:31 AM   Specimen: Abscess  Result Value Ref Range Status   Specimen Description ABSCESS  Final   Special Requests NONE  Final   Gram Stain   Final    RARE WBC PRESENT, PREDOMINANTLY PMN FEW GRAM POSITIVE COCCI IN PAIRS RARE GRAM NEGATIVE RODS Performed at [facility], [place] [address], [place], [place] [address]    Culture PENDING  Incomplete   Report Status PENDING  Incomplete  MRSA PCR Screening     Status: None   Collection Time: 02/18/20  8:01 PM   Specimen: Nasopharyngeal  Result Value Ref Range Status     MRSA by PCR NEGATIVE NEGATIVE Final    Comment:        The GeneXpert MRSA Assay (FDA approved for NASAL specimens only), is one component of a comprehensive MRSA colonization surveillance program. It is not intended to diagnose MRSA infection nor to guide or monitor treatment for MRSA infections. Performed at [facility], [place] [address] [place], [place] [address]          Radiology Studies: MR TOES RIGHT WO CONTRAST  Result Date: 02/18/2020 CLINICAL DATA:  The patient noticed a blister on her right great toe 2 weeks ago. Pain and swelling. EXAM: MRI OF THE RIGHT TOES WITHOUT CONTRAST TECHNIQUE: Multiplanar, multisequence MR imaging of the right toes was performed. No intravenous contrast was administered. COMPARISON:  Plain films of the right foot 02/16/2020. FINDINGS: Bones/Joint/Cartilage Patient motion degrades the examination despite repeating sequences and using motion reduction techniques. There is no bone marrow signal abnormality to suggest osteomyelitis. No joint effusion. No evidence of arthropathy. Ligaments Intact. Muscles and Tendons No intramuscular fluid collection. Intermediate increased T2 signal in all intrinsic musculature the foot is most consistent with diabetic myopathy. Soft tissues Skin wound is seen at the level of the IP joint of the great toe on the medial side. Underlying subcutaneous edema is present. A thin fluid collection along the plantar surface of the distal phalanx measures 1 cm transverse by 0.3 cm craniocaudal by 1.2 cm long. The collection is contiguous with the underlying distal phalanx. IMPRESSION: Motion degraded examination. Skin ulceration at the IP joint of the great toe. There is no evidence of osteomyelitis but a thin fluid collection contiguous with the undersurface of the distal phalanx is worrisome for abscess. Electronically Signed   By: Inge Rise M.D.   On: 02/18/2020 08:34   VAS [place] ABI WITH/WO TBI  Result Date:  02/18/2020 LOWER EXTREMITY DOPPLER STUDY Indications: Ulceration, and PVD. High Risk         Hypertension, hyperlipidemia, Diabetes, coronary artery Factors:  disease.  Comparison Study: no prior Performing Technologist: Abram Sander RVS  Examination Guidelines: A complete evaluation includes at minimum, Doppler waveform signals and systolic blood pressure reading at the level of bilateral brachial, anterior tibial, and posterior tibial arteries, when vessel segments are accessible. Bilateral testing is considered an integral part of a complete examination. Photoelectric Plethysmograph (PPG) waveforms and toe systolic pressure readings are included as required and additional duplex testing as needed. Limited examinations for reoccurring indications may be performed as noted.  ABI Findings: +--------+------------------+-----+---------+--------+ Right   Rt Pressure (mmHg)IndexWaveform Comment  +--------+------------------+-----+---------+--------+ [id number]                    triphasic         +--------+------------------+-----+---------+--------+ PTA     128               0.90 biphasic          +--------+------------------+-----+---------+--------+ DP      129               0.91 triphasic         +--------+------------------+-----+---------+--------+ +--------+------------------+-----+---------+-------+ Left    Lt Pressure (mmHg)IndexWaveform Comment +--------+------------------+-----+---------+-------+ Brachial130                    biphasic         +--------+------------------+-----+---------+-------+ PTA     141               0.99 biphasic         +--------+------------------+-----+---------+-------+ DP      135               0.95 triphasic        +--------+------------------+-----+---------+-------+ +-------+-----------+-----------+------------+------------+ ABI/TBIToday's ABIToday's TBIPrevious ABIPrevious TBI  +-------+-----------+-----------+------------+------------+ Right  0.91                                           +-------+-----------+-----------+------------+------------+ Left   0.99                                           +-------+-----------+-----------+------------+------------+  Summary: Right: Resting right ankle-brachial index indicates mild right lower extremity arterial disease. Left: Resting left ankle-brachial index is within normal range. No evidence of significant left lower extremity arterial disease.  *See table(s) above for measurements and observations.  Electronically signed by Servando Snare MD on 02/18/2020 at 1:17:10 PM.   Final         Scheduled Meds: . clopidogrel  75 mg Oral Daily  . enoxaparin (LOVENOX) injection  50 mg Subcutaneous Q24H  . furosemide  40 mg Oral Daily  . insulin aspart  0-20 Units Subcutaneous TID WC  . insulin aspart  0-5 Units Subcutaneous QHS  . insulin aspart  10 Units Subcutaneous TID WC  . insulin glargine  45 Units Subcutaneous QHS  . living well with diabetes book   Does not apply Once  . magnesium oxide  400 mg Oral BID  . metoprolol succinate  150 mg Oral Daily  . pantoprazole  40 mg Oral Daily  . potassium chloride  40 mEq Oral BID  . pravastatin  20 mg Oral QPM  . ramipril  1.25 mg Oral Daily  . sodium chloride flush  3 mL Intravenous Q12H   Continuous  Infusions: . sodium chloride    . cefTRIAXone (ROCEPHIN)  IV 2 g (02/18/20 1610)  . magnesium sulfate bolus IVPB    . metronidazole 500 mg (02/19/20 0957)  . sodium chloride    . vancomycin 1,000 mg (02/19/20 0618)     LOS: 3 days    Time spent: 25 minutes    Barb Merino, MD [facility] Pager [phone]

## 2020-02-19 NOTE — Plan of Care (Signed)
  Problem: Activity: Goal: Risk for activity intolerance will decrease Outcome: Progressing   Problem: Nutrition: Goal: Adequate nutrition will be maintained Outcome: Progressing   

## 2020-02-19 NOTE — Transfer of Care (Signed)
Immediate Anesthesia Transfer of Care Note  Patient: Jasmine Richardson  Procedure(s) Performed: INCISION AND DRAINAGE (Right )  Patient Location: PACU  Anesthesia Type:MAC  Level of Consciousness: awake and alert   Airway & Oxygen Therapy: Patient Spontanous Breathing  Post-op Assessment: Report given to RN and Post -op Vital signs reviewed and stable  Post vital signs: Reviewed and stable  Last Vitals:  Vitals Value Taken Time  BP 124/68 02/19/20 1410  Temp    Pulse 68 02/19/20 1414  Resp 20 02/19/20 1414  SpO2 98 % 02/19/20 1414  Vitals shown include unvalidated device data.  Last Pain:  Vitals:   02/19/20 1257  TempSrc:   PainSc: 5       Patients Stated Pain Goal: 3 (39/12/25 8346)  Complications: No apparent anesthesia complications

## 2020-02-19 NOTE — Progress Notes (Signed)
Inpatient Diabetes Program Recommendations  AACE/ADA: New Consensus Statement on Inpatient Glycemic Control (2015)  Target Ranges:  Prepandial:   less than 140 mg/dL      Peak postprandial:   less than 180 mg/dL (1-2 hours)      Critically ill patients:  140 - 180 mg/dL   Lab Results  Component Value Date   GLUCAP 285 (H) 02/19/2020   HGBA1C 11.7 (H) 02/16/2020    Review of Glycemic Control Results for Jasmine Richardson, Jasmine Richardson (MRN [id number]) as of 02/19/2020 10:31  Ref. Range 02/18/2020 09:28 02/18/2020 11:27 02/18/2020 17:18 02/18/2020 20:50 02/19/2020 06:48  Glucose-Capillary Latest Ref Range: 70 - 99 mg/dL 317 (H) 307 (H) 245 (H) 358 (H) 285 (H)   Diabetes history: DM 2 Outpatient Diabetes medications:  Basaglar 10 units daily Glipizide 10 mg bid Januvia 100 mg daily Metformin XR 1000 mg bid Current orders for Inpatient glycemic control:  Lantus 35 units q HS Novolog 8 units tid with meals Novolog resistant tid with meals and HS  Inpatient Diabetes Program Recommendations:   -Increase Lantus to 45 units q hs -Increase Novolog 10 units tid meal coverage if eats 50%  Thank you, Bethena Roys E. Ewin Rehberg, RN, MSN, CDE  Diabetes Coordinator Inpatient Glycemic Control Team Team Pager [phone] (8am-5pm) 02/19/2020 10:33 AM

## 2020-02-19 NOTE — Anesthesia Postprocedure Evaluation (Signed)
Anesthesia Post Note  Patient: Jasmine Richardson  Procedure(s) Performed: INCISION AND DRAINAGE (Right )     Patient location during evaluation: PACU Anesthesia Type: MAC Level of consciousness: awake and alert Pain management: pain level controlled Vital Signs Assessment: post-procedure vital signs reviewed and stable Respiratory status: spontaneous breathing, nonlabored ventilation, respiratory function stable and patient connected to nasal cannula oxygen Cardiovascular status: stable and blood pressure returned to baseline Postop Assessment: no apparent nausea or vomiting Anesthetic complications: no    Last Vitals:  Vitals:   02/19/20 1417 02/19/20 1429  BP: 120/74 102/77  Pulse: 68 70  Resp: 20 18  Temp:  36.6 C  SpO2: 98% 99%    Last Pain:  Vitals:   02/19/20 1429  TempSrc: Oral  PainSc:                  Barnet [place]

## 2020-02-19 NOTE — Progress Notes (Addendum)
PATIENT:  Jasmine Richardson  59 y.o. female  PRE-OPERATIVE DIAGNOSIS:  Abscess  POST-OPERATIVE DIAGNOSIS:  Abscess  PROCEDURE:  Procedure(s) with comments: INCISION AND DRAINAGE (Right) - Block done by surgeon  SURGEON:  Surgeon(s) and Role:    * Trula Slade, DPM - Primary  PHYSICIAN ASSISTANT:   ASSISTANTS: none   ANESTHESIA:   MAC  EBL:  1 mL   BLOOD ADMINISTERED:none  DRAINS: none   LOCAL MEDICATIONS USED:  OTHER 22 cc lidocaine and marcaine plain  SPECIMEN:  Source of Specimen:  wound culture  DISPOSITION OF SPECIMEN:  PATHOLOGY  COUNTS:  YES  TOURNIQUET:  * No tourniquets in log *  DICTATION: .Dragon Dictation  PLAN OF CARE: Admit to inpatient   PATIENT DISPOSITION:  PACU - hemodynamically stable.   Delay start of Pharmacological VTE agent (>24hrs) due to surgical blood loss or risk of bleeding: no  Indications for surgery: Jasmine Richardson was admitted for cellulitis/ulcer to the right hallux. She was started on IV antibiotics. The toe has had mild improvement but there is still swelling, erythema to the hallux. MRI and x-ray were negative for osteomyelitis but worrisome for abscess.  Given concern for abscess discussed with her amputation but she did not want proceed at this time.  We will proceed with wound debridement, incision and drainage.  Alternatives, risks, complications were discussed.  No promises or guarantees given.  She understands that she is at high risk of amputation.  Procedure in detail: The patient was both verbally and visually identified by myself, the nursing staff, the anesthesia staff in the preoperative holding area.  She was transferred the operating room via stretcher and placed on the operative table in supine position.  Tourniquet was applied making sure to pad all bony prominences but this was not inflated during the procedure.  Mixture of 20 cc of lidocaine, Marcaine plain was infiltrated in a regional block fashion.  The right lower  extremities and scrubbed, prepped, draped in normal sterile fashion.  Another timeout was performed.  I utilized #15 with scalpel and was able to excise the area of necrotic tissue to the medial aspect of the hallux.  There was purulence underneath this and malodor.  There was found to be continuation of a wound to the plantar aspect of the distal phalanx and there was an abscess present there is purulence.  The wound was cultured.  I debrided the wound utilizing a 15 blade scalpel as well as a rongeur.  Edges were bleeding however it was found to be tissue loss present on the plantar aspect as well as the medial aspect of the wound.  Wound was copiously irrigated with saline and hemostasis achieved.  Xeroform was applied followed by dry sterile dressing.  Postoperative course: Patient remained inpatient on IV antibiotics.  I discussed with the patient son she is going need to have amputation of the toe.  We will discussed with her as well.  Will discuss the case with Dr. Posey Pronto who will be covering this weekend.   Jasmine Richardson, DPM O: [phone] C: [phone]

## 2020-02-19 NOTE — Op Note (Signed)
**  THIS IS A DUPLICATE AS I DICTATED THIS AS A PROGRESS NOTE ON ERROR**  PATIENT:  Jasmine Richardson  59 y.o. female  PRE-OPERATIVE DIAGNOSIS:  Abscess  POST-OPERATIVE DIAGNOSIS:  Abscess  PROCEDURE:  Procedure(s) with comments: INCISION AND DRAINAGE (Right) - Block done by surgeon  SURGEON:  Surgeon(s) and Role:    * Trula Slade, DPM - Primary  PHYSICIAN ASSISTANT:   ASSISTANTS: none   ANESTHESIA:   MAC  EBL:  1 mL   BLOOD ADMINISTERED:none  DRAINS: none   LOCAL MEDICATIONS USED:  OTHER 22 cc lidocaine and marcaine plain  SPECIMEN:  Source of Specimen:  wound culture  DISPOSITION OF SPECIMEN:  PATHOLOGY  COUNTS:  YES  TOURNIQUET:  * No tourniquets in log *  DICTATION: .Dragon Dictation  PLAN OF CARE: Admit to inpatient   PATIENT DISPOSITION:  PACU - hemodynamically stable.   Delay start of Pharmacological VTE agent (>24hrs) due to surgical blood loss or risk of bleeding: no  Indications for surgery: Jasmine Richardson was admitted for cellulitis/ulcer to the right hallux. She was started on IV antibiotics. The toe has had mild improvement but there is still swelling, erythema to the hallux. MRI and x-ray were negative for osteomyelitis but worrisome for abscess.  Given concern for abscess discussed with her amputation but she did not want proceed at this time.  We will proceed with wound debridement, incision and drainage.  Alternatives, risks, complications were discussed.  No promises or guarantees given.  She understands that she is at high risk of amputation.  Procedure in detail: The patient was both verbally and visually identified by myself, the nursing staff, the anesthesia staff in the preoperative holding area.  She was transferred the operating room via stretcher and placed on the operative table in supine position.  Tourniquet was applied making sure to pad all bony prominences but this was not inflated during the procedure.  Mixture of 20  cc of lidocaine, Marcaine plain was infiltrated in a regional block fashion.  The right lower extremities and scrubbed, prepped, draped in normal sterile fashion.  Another timeout was performed.  I utilized #15 with scalpel and was able to excise the area of necrotic tissue to the medial aspect of the hallux.  There was purulence underneath this and malodor.  There was found to be continuation of a wound to the plantar aspect of the distal phalanx and there was an abscess present there is purulence.  The wound was cultured.  I debrided the wound utilizing a 15 blade scalpel as well as a rongeur.  Edges were bleeding however it was found to be tissue loss present on the plantar aspect as well as the medial aspect of the wound.  Wound was copiously irrigated with saline and hemostasis achieved.  Xeroform was applied followed by dry sterile dressing.  Postoperative course: Patient remained inpatient on IV antibiotics.  I discussed with the patient son she is going need to have amputation of the toe.  We will discussed with her as well.  Will discuss the case with Dr. Posey Pronto who will be covering this weekend.   Jasmine Richardson, DPM O: [phone] C: [phone]

## 2020-02-19 NOTE — Progress Notes (Signed)
I discussed the intraoperative findings with the patient and son at bedside. Given the infection in the toe and tissue loss she needs to proceed with an amputation of the toe. She previously had not wanted to proceed with this but after discussion today she is in agreement with the surgery. Will plan for surgery tomorrow for hopeful discharge on Sunday. Dr. Posey Pronto will be taking the patient to surgery tomorrow. I have discussed this with the patient and with Dr. Posey Pronto. We discussed the surgery and postop course. We discussed risks of surgery including, but not limited to, spread of infection, delayed/nonhealing, pain, further amputation and other risks of surgery including stroke, heart attack, death.   Orders to be placed. NPO after midnight  Celesta Gentile, [place] O: [phone] C: [phone]

## 2020-02-19 NOTE — Progress Notes (Signed)
Pt's CBG 289. Pt given 11 units on 5N @ 1130 prior to transport to short stay. Dr. Valma Cava made aware.

## 2020-02-19 NOTE — Anesthesia Procedure Notes (Signed)
Procedure Name: MAC Performed by: Ignacio Lowder B, CRNA Pre-anesthesia Checklist: Patient identified, Emergency Drugs available, Suction available, Patient being monitored and Timeout performed Patient Re-evaluated:Patient Re-evaluated prior to induction Oxygen Delivery Method: Nasal cannula Preoxygenation: Pre-oxygenation with 100% oxygen Induction Type: IV induction Placement Confirmation: positive ETCO2 Dental Injury: Teeth and Oropharynx as per pre-operative assessment        

## 2020-02-19 NOTE — Anesthesia Preprocedure Evaluation (Addendum)
Anesthesia Evaluation  Patient identified by MRN, date of birth, ID band Patient awake    Reviewed: Allergy & Precautions, NPO status , Patient's Chart, lab work & pertinent test results  Airway Mallampati: I  TM Distance: >3 FB Neck ROM: Full    Dental no notable dental hx. (+) Edentulous Upper, Edentulous Lower, Dental Advisory Given   Pulmonary former smoker,    Pulmonary exam normal breath sounds clear to auscultation       Cardiovascular hypertension, + angina + CAD and + Past MI  Normal cardiovascular exam Rhythm:Regular Rate:Normal     Neuro/Psych    GI/Hepatic GERD  ,  Endo/Other  diabetes, Type 2  Renal/GU      Musculoskeletal   Abdominal (+) + obese,   Peds  Hematology   Anesthesia Other Findings   Reproductive/Obstetrics                            Anesthesia Physical Anesthesia Plan  ASA: III  Anesthesia Plan: MAC   Post-op Pain Management:    Induction: Intravenous  PONV Risk Score and Plan: Treatment may vary due to age or medical condition, Ondansetron and Dexamethasone  Airway Management Planned: LMA  Additional Equipment: None  Intra-op Plan:   Post-operative Plan:   Informed Consent: I have reviewed the patients History and Physical, chart, labs and discussed the procedure including the risks, benefits and alternatives for the proposed anesthesia with the patient or authorized representative who has indicated his/her understanding and acceptance.     Dental advisory given  Plan Discussed with:   Anesthesia Plan Comments:       Anesthesia Quick Evaluation

## 2020-02-19 NOTE — Brief Op Note (Signed)
02/19/2020  2:10 PM  PATIENT:  Tsuruko Murtha  59 y.o. female  PRE-OPERATIVE DIAGNOSIS:  Abscess  POST-OPERATIVE DIAGNOSIS:  Abscess  PROCEDURE:  Procedure(s) with comments: INCISION AND DRAINAGE (Right) - Block done by surgeon  SURGEON:  Surgeon(s) and Role:    * Trula Slade, DPM - Primary  PHYSICIAN ASSISTANT:   ASSISTANTS: none   ANESTHESIA:   MAC  EBL:  1 mL   BLOOD ADMINISTERED:none  DRAINS: none   LOCAL MEDICATIONS USED:  OTHER 22 cc lidocaine and marcaine plain  SPECIMEN:  Source of Specimen:  wound culture  DISPOSITION OF SPECIMEN:  PATHOLOGY  COUNTS:  YES  TOURNIQUET:  * No tourniquets in log *  DICTATION: .Dragon Dictation  PLAN OF CARE: Admit to inpatient   PATIENT DISPOSITION:  PACU - hemodynamically stable.   Delay start of Pharmacological VTE agent (>24hrs) due to surgical blood loss or risk of bleeding: no

## 2020-02-19 NOTE — Progress Notes (Signed)
Patient seen in pre-op. Scheduled for right big toe I&D, debridement. WBC improved today and denies any current fevers, chills. Swelling and cellulitis has improved but still necrotic area on the medial toe with localized swelling. Due to concern of abscess discussed I&D, debridement. She states she wants to do everything to try and save the toe. Given the imaging is not showing osteomyelitis at this time will attempt to salvage the toe. She understands she is at high possibility of amputation of the toe. NPO since midnight. She has no further questions or concerns. Will discuss the outcome of the surgery with her son at her request.   Celesta Gentile, DPM O: [phone] C: [phone]

## 2020-02-20 ENCOUNTER — Inpatient Hospital Stay (HOSPITAL_COMMUNITY): Payer: Commercial Managed Care - PPO | Admitting: Certified Registered Nurse Anesthetist

## 2020-02-20 ENCOUNTER — Encounter (HOSPITAL_COMMUNITY)
Admission: EM | Disposition: A | Discharge status: Home / Self Care | Payer: Self-pay | Source: Ambulatory Visit | Attending: Internal Medicine

## 2020-02-20 DIAGNOSIS — L02611 Cutaneous abscess of right foot: Secondary | ICD-10-CM

## 2020-02-20 HISTORY — PX: AMPUTATION TOE: SHX6595

## 2020-02-20 LAB — GLUCOSE, CAPILLARY
Glucose-Capillary: 250 mg/dL — ABNORMAL HIGH (ref 70–99)
Glucose-Capillary: 291 mg/dL — ABNORMAL HIGH (ref 70–99)
Glucose-Capillary: 272 mg/dL — ABNORMAL HIGH (ref 70–99)
Glucose-Capillary: 188 mg/dL — ABNORMAL HIGH (ref 70–99)

## 2020-02-20 LAB — CBC WITH DIFFERENTIAL/PLATELET
Abs Immature Granulocytes: 0.11 10*3/uL — ABNORMAL HIGH (ref 0.00–0.07)
Basophils Absolute: 0 10*3/uL (ref 0.0–0.1)
Basophils Relative: 0 %
Eosinophils Absolute: 0 10*3/uL (ref 0.0–0.5)
Eosinophils Relative: 0 %
HCT: 29.9 % — ABNORMAL LOW (ref 36.0–46.0)
Hemoglobin: 9.6 g/dL — ABNORMAL LOW (ref 12.0–15.0)
Immature Granulocytes: 1 %
Lymphocytes Relative: 12 %
Lymphs Abs: 1.5 10*3/uL (ref 0.7–4.0)
MCH: 27.9 pg (ref 26.0–34.0)
MCHC: 32.1 g/dL (ref 30.0–36.0)
MCV: 86.9 fL (ref 80.0–100.0)
Monocytes Absolute: 0.9 10*3/uL (ref 0.1–1.0)
Monocytes Relative: 8 %
Neutro Abs: 9.9 10*3/uL — ABNORMAL HIGH (ref 1.7–7.7)
Neutrophils Relative %: 79 %
Platelets: 416 10*3/uL — ABNORMAL HIGH (ref 150–400)
RBC: 3.44 MIL/uL — ABNORMAL LOW (ref 3.87–5.11)
RDW: 12.4 % (ref 11.5–15.5)
WBC: 12.5 10*3/uL — ABNORMAL HIGH (ref 4.0–10.5)
nRBC: 0 % (ref 0.0–0.2)

## 2020-02-20 LAB — BASIC METABOLIC PANEL
Anion gap: 10 (ref 5–15)
BUN: <5 mg/dL — ABNORMAL LOW (ref 6–20)
CO2: 25 mmol/L (ref 22–32)
Calcium: 7.9 mg/dL — ABNORMAL LOW (ref 8.9–10.3)
Chloride: 100 mmol/L (ref 98–111)
Creatinine, Ser: 0.67 mg/dL (ref 0.44–1.00)
GFR calc Af Amer: >60 mL/min (ref >60)
GFR calc non Af Amer: >60 mL/min (ref >60)
Glucose, Bld: 262 mg/dL — ABNORMAL HIGH (ref 70–99)
Potassium: 3.8 mmol/L (ref 3.5–5.1)
Sodium: 135 mmol/L (ref 135–145)

## 2020-02-20 SURGERY — AMPUTATION, TOE
Anesthesia: Monitor Anesthesia Care | Site: Toe | Laterality: Right

## 2020-02-20 MED ORDER — BUPIVACAINE HCL 0.5 % IJ SOLN
INTRAMUSCULAR | Status: DC | PRN
  Administered 2020-02-20: 10 mL

## 2020-02-20 MED ORDER — BUPIVACAINE HCL (PF) 0.5 % IJ SOLN
INTRAMUSCULAR | Status: AC
  Filled 2020-02-20: qty 30

## 2020-02-20 MED ORDER — MIDAZOLAM HCL 2 MG/2ML IJ SOLN
INTRAMUSCULAR | Status: AC
  Filled 2020-02-20: qty 2

## 2020-02-20 MED ORDER — SODIUM CHLORIDE FLUSH 0.9 % IV SOLN
INTRAVENOUS | Status: AC
  Filled 2020-02-20: qty 10

## 2020-02-20 MED ORDER — FENTANYL CITRATE (PF) 100 MCG/2ML IJ SOLN: 25.0000 ug | INTRAMUSCULAR | Status: DC | PRN

## 2020-02-20 MED ORDER — MEPERIDINE HCL 25 MG/ML IJ SOLN: 6.2500 mg | INTRAMUSCULAR | Status: DC | PRN

## 2020-02-20 MED ORDER — MIDAZOLAM HCL 5 MG/5ML IJ SOLN
INTRAMUSCULAR | Status: DC | PRN
  Administered 2020-02-20: 2 mg via INTRAVENOUS

## 2020-02-20 MED ORDER — MIDAZOLAM HCL 2 MG/2ML IJ SOLN: 0.5000 mg | Freq: Once | INTRAMUSCULAR | Status: DC | PRN

## 2020-02-20 MED ORDER — PROPOFOL 10 MG/ML IV BOLUS
INTRAVENOUS | Status: AC
  Filled 2020-02-20: qty 20

## 2020-02-20 MED ORDER — FENTANYL CITRATE (PF) 100 MCG/2ML IJ SOLN
INTRAMUSCULAR | Status: DC | PRN
  Administered 2020-02-20: 100 ug via INTRAVENOUS
  Administered 2020-02-20: 50 ug via INTRAVENOUS

## 2020-02-20 MED ORDER — ONDANSETRON HCL 4 MG/2ML IJ SOLN
INTRAMUSCULAR | Status: DC | PRN
  Administered 2020-02-20: 4 mg via INTRAVENOUS

## 2020-02-20 MED ORDER — PROMETHAZINE HCL 25 MG/ML IJ SOLN: 6.2500 mg | INTRAMUSCULAR | Status: DC | PRN

## 2020-02-20 MED ORDER — VANCOMYCIN HCL 1000 MG IV SOLR
INTRAVENOUS | Status: AC
  Filled 2020-02-20: qty 1000

## 2020-02-20 MED ORDER — ALUM & MAG HYDROXIDE-SIMETH 200-200-20 MG/5ML PO SUSP: 30.0000 mL | Freq: Four times a day (QID) | ORAL | Status: DC | PRN

## 2020-02-20 MED ORDER — 0.9 % SODIUM CHLORIDE (POUR BTL) OPTIME
TOPICAL | Status: DC | PRN
  Administered 2020-02-20: 1000 mL

## 2020-02-20 MED ORDER — FENTANYL CITRATE (PF) 250 MCG/5ML IJ SOLN
INTRAMUSCULAR | Status: AC
  Filled 2020-02-20: qty 5

## 2020-02-20 MED ORDER — PROPOFOL 500 MG/50ML IV EMUL
INTRAVENOUS | Status: DC | PRN
  Administered 2020-02-20: 25 ug/kg/min via INTRAVENOUS

## 2020-02-20 MED ORDER — SODIUM CHLORIDE 0.9 % IR SOLN
Status: DC | PRN
  Administered 2020-02-20: 3000 mL

## 2020-02-20 MED ORDER — LACTATED RINGERS IV SOLN: INTRAVENOUS | Status: DC

## 2020-02-20 SURGICAL SUPPLY — 30 items
BLADE LONG MED 31X9 (MISCELLANEOUS) ×1 IMPLANT
BNDG ELASTIC 4X5.8 VLCR STR LF (GAUZE/BANDAGES/DRESSINGS) ×1 IMPLANT
BNDG GAUZE ELAST 4 BULKY (GAUZE/BANDAGES/DRESSINGS) ×1 IMPLANT
COVER SURGICAL LIGHT HANDLE (MISCELLANEOUS) ×2 IMPLANT
COVER WAND RF STERILE (DRAPES) ×2 IMPLANT
ELECT REM PT RETURN 9FT ADLT (ELECTROSURGICAL) ×2
ELECTRODE REM PT RTRN 9FT ADLT (ELECTROSURGICAL) ×1 IMPLANT
GAUZE SPONGE 4X4 12PLY STRL LF (GAUZE/BANDAGES/DRESSINGS) ×1 IMPLANT
GLOVE BIO SURGEON STRL SZ7.5 (GLOVE) ×2 IMPLANT
GLOVE BIOGEL PI IND STRL 8 (GLOVE) ×1 IMPLANT
GLOVE BIOGEL PI INDICATOR 8 (GLOVE) ×1
GOWN STRL REUS W/ TWL LRG LVL3 (GOWN DISPOSABLE) ×1 IMPLANT
GOWN STRL REUS W/ TWL XL LVL3 (GOWN DISPOSABLE) ×1 IMPLANT
GOWN STRL REUS W/TWL LRG LVL3 (GOWN DISPOSABLE) ×2
GOWN STRL REUS W/TWL XL LVL3 (GOWN DISPOSABLE) ×2
KIT BASIN OR (CUSTOM PROCEDURE TRAY) ×2 IMPLANT
KIT TURNOVER KIT B (KITS) ×2 IMPLANT
NDL HYPO 25GX1X1/2 BEV (NEEDLE) ×1 IMPLANT
NEEDLE HYPO 25GX1X1/2 BEV (NEEDLE) ×2 IMPLANT
NS IRRIG 1000ML POUR BTL (IV SOLUTION) ×2 IMPLANT
PACK ORTHO EXTREMITY (CUSTOM PROCEDURE TRAY) ×2 IMPLANT
PAD ARMBOARD 7.5X6 YLW CONV (MISCELLANEOUS) ×2 IMPLANT
PENCIL SMOKE EVACUATOR (MISCELLANEOUS) ×1 IMPLANT
SOL PREP POV-IOD 4OZ 10% (MISCELLANEOUS) ×4 IMPLANT
SUT ETHILON 3 0 PS 1 (SUTURE) ×1 IMPLANT
SUT PROLENE 3 0 PS 2 (SUTURE) ×4 IMPLANT
SYR CONTROL 10ML LL (SYRINGE) ×2 IMPLANT
TOWEL GREEN STERILE (TOWEL DISPOSABLE) ×2 IMPLANT
TUBE CONNECTING 12X1/4 (SUCTIONS) ×2 IMPLANT
YANKAUER SUCT BULB TIP NO VENT (SUCTIONS) ×2 IMPLANT

## 2020-02-20 NOTE — Progress Notes (Signed)
  Subjective:  [name], female    DOB: October 11, 1961,  MRN: [id number]  HPI:  As 59 y.o. female presents with worsening infection of the right hallux.  Patient is well-known to Dr. Earleen Newport who was following her and took her to the operating room yesterday for incision and drainage of the right hallux with cultures.  Intraoperative it appeared that the distal hallux and the toe itself is nonviable given the amount of necrotic damage/burden associated with the hallux.  It was determined that patient will benefit from an amputation of the right hallux likely to the level of the metatarsophalangeal joint for proper decompression of purulent drainage as well as excision of the infection.  However at that time patient was not amenable to amputation.  Today patient is amenable to amputation I discussed this with the patient in extensive detail.  Objective:   Vitals:   02/20/20 0421 02/20/20 0836  BP: 123/60 131/61  Pulse: 71 75  Resp: 16 18  Temp: 99 F (37.2 C) 98.3 F (36.8 C)  SpO2: 92% 96%   General AA&O x3. Normal mood and affect.  Vascular Dorsalis pedis and posterior tibial pulses 2/4 bilat. Brisk capillary refill to all digits. Pedal hair present.  Neurologic Epicritic sensation grossly intact.  Dermatologic  pain on palpation to the right hallux.  There is necrosis of the epidermal layer of the distal phalanx as well as the digit itself.  There is edema present to the hallux as well.  Orthopedic: MMT 5/5 in dorsiflexion, plantarflexion, inversion, and eversion. Normal joint ROM without pain or crepitus.    Assessment & Plan:  Patient was evaluated and treated and all questions answered.  Right hallux cellulitis with abscess formation status post incision and drainage with concerning of worsening infection -All questions and concerns were discussed with the patient. -MRI was evaluated by me and discussed with the patient as well. -Given the clinical situation of how  the toe looks with necrosis and epidermal lysis, I believe patient will benefit from an amputation at the level of the metatarsophalangeal joint of the right hallux. -I discussed this with the patient and patient is amenable to undergoing surgery. -Informed surgical risk consent was reviewed and read aloud to the patient.  I reviewed the films.  I have discussed my findings with the patient in great detail.  I have discussed all risks including but not limited to infection, stiffness, scarring, limp, disability, deformity, damage to blood vessels and nerves, numbness, poor healing, need for braces, arthritis, chronic pain, amputation, death.  All benefits and realistic expectations discussed in great detail.  I have made no promises as to the outcome.  I have provided realistic expectations.  I have offered the patient a 2nd opinion, which they have declined and assured me they preferred to proceed despite the risks   Felipa Furnace, DPM  Accessible via secure chat for questions or concerns.

## 2020-02-20 NOTE — Progress Notes (Signed)
PROGRESS NOTE    Truc Winfree  WJX:[id number] DOB: 1961-02-06 DOA: 02/16/2020 PCP: Ernestene Kiel, MD    Brief Narrative:  59 year old female with history of coronary artery disease, chronic diastolic heart failure, hypertension, hyperlipidemia, type 2 diabetes uncontrolled presented to the hospital from podiatry surgery office with right great toe wound of about 2 weeks.  Also noted some fever at home.  Also had some pain on left inner thigh. In the emergency room she was found to have leukocytosis.  Started on antibiotics and admitted.   Assessment & Plan:   Principal Problem:   Diabetic foot ulcer ([place]) Active Problems:   Chronic diastolic heart failure (HCC)   Coronary artery disease involving native coronary artery of native heart with angina pectoris (HCC)   Essential hypertension   GERD (gastroesophageal reflux disease)   Hyperlipidemia   Morbid obesity (HCC)   S/P CABG (coronary artery bypass graft)   Uncontrolled type 2 diabetes mellitus ([place])  Sepsis secondary to diabetic foot ulcer:  Present with significant localized infection and soft tissue inflammation.  Treated with broad external antibiotic.   Status post surgical debridement, 3/5  Patient going for right great toe amputation today.   Surgical cultures no growth so far.  Thigh culture no growth so far.   Postop management as per surgery.   Will change to oral antibiotics on discharge tomorrow if adequate clean margins met.  Left upper medial thigh cellulitis/abscess: Small localized induration, spontaneously drained.  No growth so far. dressing and hot compress.  Type 2 diabetes, uncontrolled with hyperglycemia: A1c more than 11.  Blood sugar still uncontrolled.   Continue to increase insulin coverage.  On increasing dose of insulin.  She is n.p.o. today.  Continue similar doses.  Hypertension: Blood pressure stable on Toprol and ramipril.  Hyperlipidemia, she is on a statin that she will continue.   Hypomagnesemia: Replaced and normalized.   DVT prophylaxis: Lovenox Code Status: Full code Family Communication: None.  Surgery talking to family. Disposition Plan: patient is from home. Anticipated DC to home, Barriers to discharge on IV antibiotics, continues to need IV antibiotics and clinical monitoring.  Going for surgery today.    Consultants:   Podiatry surgery  Procedures:   None  Antimicrobials:  Anti-infectives (From admission, onward)   Start     Dose/Rate Route Frequency Ordered Stop   02/18/20 1800  [MAR Hold]  vancomycin (VANCOCIN) IVPB 1000 mg/200 mL premix     (MAR Hold since Sat 02/20/2020 at 1037.Hold Reason: Transfer to a Procedural area.)   1,000 mg 200 mL/hr over 60 Minutes Intravenous Every 12 hours 02/18/20 1252     02/17/20 1700  cefTRIAXone (ROCEPHIN) 1 g in sodium chloride 0.9 % 100 mL IVPB  Status:  Discontinued     1 g 200 mL/hr over 30 Minutes Intravenous Every 24 hours 02/16/20 1746 02/17/20 0918   02/17/20 1700  [MAR Hold]  cefTRIAXone (ROCEPHIN) 2 g in sodium chloride 0.9 % 100 mL IVPB     (MAR Hold since Sat 02/20/2020 at 1037.Hold Reason: Transfer to a Procedural area.)   2 g 200 mL/hr over 30 Minutes Intravenous Every 24 hours 02/17/20 0918     02/16/20 2330  vancomycin (VANCOREADY) IVPB 1500 mg/300 mL  Status:  Discontinued     1,500 mg 150 mL/hr over 120 Minutes Intravenous Every 24 hours 02/16/20 2252 02/18/20 1252   02/16/20 1800  [MAR Hold]  metroNIDAZOLE (FLAGYL) IVPB 500 mg     (MAR Hold since Sat  02/20/2020 at 1037.Hold Reason: Transfer to a Procedural area.)   500 mg 100 mL/hr over 60 Minutes Intravenous Every 8 hours 02/16/20 1746     02/16/20 1600  vancomycin (VANCOREADY) IVPB 1500 mg/300 mL  Status:  Discontinued     1,500 mg 150 mL/hr over 120 Minutes Intravenous Every 24 hours 02/16/20 1535 02/16/20 2252   02/16/20 1545  vancomycin (VANCOCIN) IVPB 1000 mg/200 mL premix  Status:  Discontinued     1,000 mg 200 mL/hr over 60 Minutes  Intravenous  Once 02/16/20 1530 02/16/20 1535   02/16/20 1545  cefTRIAXone (ROCEPHIN) 2 g in sodium chloride 0.9 % 100 mL IVPB     2 g 200 mL/hr over 30 Minutes Intravenous  Once 02/16/20 1530 02/16/20 1726         Subjective: Seen and examined.  She was about to go for surgery.  She was very anxious and distressed due to thought about surgery.  Objective: Vitals:   02/19/20 1953 02/20/20 0021 02/20/20 0421 02/20/20 0836  BP: (!) 151/76 126/60 123/60 131/61  Pulse: 83 79 71 75  Resp: '18 18 16 18  ' Temp: 98.6 F (37 C) 98.7 F (37.1 C) 99 F (37.2 C) 98.3 F (36.8 C)  TempSrc: Oral Oral Oral Oral  SpO2: 100% 93% 92% 96%  Weight:      Height:        Intake/Output Summary (Last 24 hours) at 02/20/2020 1134 Last data filed at 02/20/2020 0700 Gross per 24 hour  Intake 840 ml  Output 1 ml  Net 839 ml   Filed Weights   02/16/20 1534 02/19/20 1301  Weight: 104.3 kg 104.3 kg    Examination:  General exam: Anxious, comfortable Respiratory system: Clear to auscultation. Respiratory effort normal.  No added sounds. Cardiovascular system: S1 & S2 heard, RRR. No JVD, murmurs, rubs, gallops or clicks.  Gastrointestinal system: Abdomen is nondistended, soft and nontender. No organomegaly or masses felt. Normal bowel sounds heard. Central nervous system: Alert and oriented. No focal neurological deficits. Extremities: Symmetric 5 x 5 power. Skin: No rashes, lesions or ulcers Psychiatry: Judgement and insight appear normal. Mood & affect appropriate.  Left thigh, decreased induration and size of the swelling, minimal drainage.  Nontender. Right great toe with surgical dressing on.  Not removed by me.    Data Reviewed: I have personally reviewed following labs and imaging studies  CBC: Recent Labs  Lab 02/16/20 1327 02/17/20 0523 02/18/20 0407 02/19/20 0510 02/20/20 0449  WBC 18.5* 15.8* 15.7* 11.5* 12.5*  NEUTROABS 15.5*  --  13.0* 9.4* 9.9*  HGB 11.1* 10.1* 9.5* 9.5* 9.6*   HCT 34.8* 30.4* 28.8* 29.3* 29.9*  MCV 89.7 87.1 87.5 86.4 86.9  PLT 425* 317 367 391 841*   Basic Metabolic Panel: Recent Labs  Lab 02/16/20 1327 02/17/20 0523 02/18/20 0407 02/19/20 0510 02/20/20 0449  NA 130* 132* 131* 133* 135  K 4.3 3.8 4.3 3.3* 3.8  CL 91* 96* 99 98 100  CO2 25 22 21* 25 25  GLUCOSE 578* 383* 282* 290* 262*  BUN '8 6 9 ' 5* <5*  CREATININE 0.83 0.77 0.77 0.68 0.67  CALCIUM 9.5 8.7* 7.9* 8.0* 7.9*  MG  --   --  1.1* 1.6*  --   PHOS  --   --  2.4*  --   --    GFR: Estimated Creatinine Clearance: 94.1 mL/min (by C-G formula based on SCr of 0.67 mg/dL). Liver Function Tests: Recent Labs  Lab 02/16/20 1327  AST 12*  ALT 11  ALKPHOS 62  BILITOT 0.7  PROT 7.8  ALBUMIN 3.1*   No results for input(s): LIPASE, AMYLASE in the last 168 hours. No results for input(s): AMMONIA in the last 168 hours. Coagulation Profile: Recent Labs  Lab 02/16/20 1327  INR 1.2   Cardiac Enzymes: No results for input(s): CKTOTAL, CKMB, CKMBINDEX, TROPONINI in the last 168 hours. BNP (last 3 results) No results for input(s): PROBNP in the last 8760 hours. HbA1C: No results for input(s): HGBA1C in the last 72 hours. CBG: Recent Labs  Lab 02/19/20 1256 02/19/20 1412 02/19/20 1639 02/19/20 1948 02/20/20 1024  GLUCAP 289* 270* 276* 275* 250*   Lipid Profile: No results for input(s): CHOL, HDL, LDLCALC, TRIG, CHOLHDL, LDLDIRECT in the last 72 hours. Thyroid Function Tests: No results for input(s): TSH, T4TOTAL, FREET4, T3FREE, THYROIDAB in the last 72 hours. Anemia Panel: No results for input(s): VITAMINB12, FOLATE, FERRITIN, TIBC, IRON, RETICCTPCT in the last 72 hours. Sepsis Labs: Recent Labs  Lab 02/16/20 1314 02/16/20 1546  LATICACIDVEN 3.8* 2.4*    Recent Results (from the past 240 hour(s))  Culture, blood (Routine x 2)     Status: None (Preliminary result)   Collection Time: 02/16/20  2:04 PM   Specimen: BLOOD  Result Value Ref Range Status    Specimen Description BLOOD RIGHT ANTECUBITAL  Final   Special Requests   Final    BOTTLES DRAWN AEROBIC AND ANAEROBIC Blood Culture results may not be optimal due to an inadequate volume of blood received in culture bottles   Culture   Final    NO GROWTH 4 DAYS Performed at [facility], [place] [address], [place], [place] [address]    Report Status PENDING  Incomplete  Culture, blood (Routine x 2)     Status: None (Preliminary result)   Collection Time: 02/16/20  3:46 PM   Specimen: BLOOD  Result Value Ref Range Status   Specimen Description BLOOD LEFT ANTECUBITAL  Final   Special Requests   Final    BOTTLES DRAWN AEROBIC AND ANAEROBIC Blood Culture adequate volume   Culture   Final    NO GROWTH 4 DAYS Performed at [facility], [place] [address] [place], [place] [address]    Report Status PENDING  Incomplete  Respiratory Panel by RT PCR (Flu A&B, Covid) - Nasopharyngeal Swab     Status: None   Collection Time: 02/16/20  3:58 PM   Specimen: Nasopharyngeal Swab  Result Value Ref Range Status   SARS Coronavirus 2 by RT PCR NEGATIVE NEGATIVE Final    Comment: (NOTE) SARS-CoV-2 target nucleic acids are NOT DETECTED. The SARS-CoV-2 RNA is generally detectable in upper respiratoy specimens during the acute phase of infection. The lowest concentration of SARS-CoV-2 viral copies this assay can detect is 131 copies/mL. A negative result does not preclude SARS-Cov-2 infection and should not be used as the sole basis for treatment or other patient management decisions. A negative result may occur with  improper specimen collection/handling, submission of specimen other than nasopharyngeal swab, presence of viral mutation(s) within the areas targeted by this assay, and inadequate number of viral copies (<131 copies/mL). A negative result must be combined with clinical observations, patient history, and epidemiological information. The expected result is Negative. Fact Sheet for  Patients:  PinkCheek.be Fact Sheet for Healthcare Providers:  GravelBags.it This test is not yet ap proved or cleared by the [place] FDA and  has been authorized for detection and/or  diagnosis of SARS-CoV-2 by FDA under an Emergency Use Authorization (EUA). This EUA will remain  in effect (meaning this test can be used) for the duration of the COVID-19 declaration under Section 564(b)(1) of the Act, 21 U.S.C. section 360bbb-3(b)(1), unless the authorization is terminated or revoked sooner.    Influenza A by PCR NEGATIVE NEGATIVE Final   Influenza B by PCR NEGATIVE NEGATIVE Final    Comment: (NOTE) The Xpert Xpress SARS-CoV-2/FLU/RSV assay is intended as an aid in  the diagnosis of influenza from Nasopharyngeal swab specimens and  should not be used as a sole basis for treatment. Nasal washings and  aspirates are unacceptable for Xpert Xpress SARS-CoV-2/FLU/RSV  testing. Fact Sheet for Patients: PinkCheek.be Fact Sheet for Healthcare Providers: GravelBags.it This test is not yet approved or cleared by the [place] FDA and  has been authorized for detection and/or diagnosis of SARS-CoV-2 by  FDA under an Emergency Use Authorization (EUA). This EUA will remain  in effect (meaning this test can be used) for the duration of the  Covid-19 declaration under Section 564(b)(1) of the Act, 21  U.S.C. section 360bbb-3(b)(1), unless the authorization is  terminated or revoked. Performed at [facility], [place] [address], [place], [place] [address]   Aerobic Culture (superficial specimen)     Status: None (Preliminary result)   Collection Time: 02/18/20 10:31 AM   Specimen: Abscess  Result Value Ref Range Status   Specimen Description ABSCESS  Final   Special Requests NONE  Final   Gram Stain   Final    RARE WBC PRESENT, PREDOMINANTLY PMN FEW GRAM POSITIVE  COCCI IN PAIRS RARE GRAM NEGATIVE RODS    Culture   Final    CULTURE REINCUBATED FOR BETTER GROWTH Performed at [facility], [place] [address], [place], [place] [address]    Report Status PENDING  Incomplete  MRSA PCR Screening     Status: None   Collection Time: 02/18/20  8:01 PM   Specimen: Nasopharyngeal  Result Value Ref Range Status   MRSA by PCR NEGATIVE NEGATIVE Final    Comment:        The GeneXpert MRSA Assay (FDA approved for NASAL specimens only), is one component of a comprehensive MRSA colonization surveillance program. It is not intended to diagnose MRSA infection nor to guide or monitor treatment for MRSA infections. Performed at [facility], [place] [address] [place], [place] [address]   Aerobic/Anaerobic Culture (surgical/deep wound)     Status: None (Preliminary result)   Collection Time: 02/19/20  1:52 PM   Specimen: Abscess  Result Value Ref Range Status   Specimen Description ABSCESS RIGHT TOE GREAT  Final   Special Requests NONE  Final   Gram Stain   Final    RARE WBC PRESENT, PREDOMINANTLY PMN MODERATE GRAM NEGATIVE RODS FEW GRAM POSITIVE COCCI Performed at [facility], [address] [place], [place] [address]    Culture PENDING  Incomplete   Report Status PENDING  Incomplete         Radiology Studies: No results found.      Scheduled Meds: . [MAR Hold] clopidogrel  75 mg Oral Daily  . [MAR Hold] enoxaparin (LOVENOX) injection  50 mg Subcutaneous Q24H  . [MAR Hold] furosemide  40 mg Oral Daily  . [MAR Hold] insulin aspart  0-20 Units Subcutaneous TID WC  . [MAR Hold] insulin aspart  0-5 Units Subcutaneous QHS  . [MAR Hold] insulin aspart  10  Units Subcutaneous TID WC  . [MAR Hold] insulin glargine  45 Units Subcutaneous QHS  . [MAR Hold] living well with diabetes book   Does not apply Once  . [MAR Hold] magnesium oxide  400 mg Oral BID  . [MAR Hold] metoprolol succinate  150 mg Oral Daily  . [MAR Hold]  pantoprazole  40 mg Oral Daily  . [MAR Hold] potassium chloride  40 mEq Oral BID  . [MAR Hold] pravastatin  20 mg Oral QPM  . [MAR Hold] ramipril  1.25 mg Oral Daily  . [MAR Hold] sodium chloride flush  3 mL Intravenous Q12H   Continuous Infusions: . [MAR Hold] sodium chloride    . [MAR Hold] cefTRIAXone (ROCEPHIN)  IV 2 g (02/19/20 1731)  . lactated ringers 10 mL/hr at 02/20/20 1046  . [MAR Hold] metronidazole 500 mg (02/20/20 0200)  . [MAR Hold] sodium chloride    . [MAR Hold] vancomycin 1,000 mg (02/20/20 0657)     LOS: 4 days    Time spent: 25 minutes    Barb Merino, MD [facility] Pager [phone]

## 2020-02-20 NOTE — Transfer of Care (Signed)
Immediate Anesthesia Transfer of Care Note  Patient: Jasmine Richardson  Procedure(s) Performed: AMPUTATION RIGHT GREAT  TOE (Right Toe)  Patient Location: PACU  Anesthesia Type:General  Level of Consciousness: drowsy and patient cooperative  Airway & Oxygen Therapy: Patient Spontanous Breathing  Post-op Assessment: Report given to RN and Post -op Vital signs reviewed and stable  Post vital signs: Reviewed and stable  Last Vitals:  Vitals Value Taken Time  BP    Temp    Pulse 65 02/20/20 1200  Resp 16 02/20/20 1200  SpO2 96 % 02/20/20 1200  Vitals shown include unvalidated device data.  Last Pain:  Vitals:   02/20/20 0900  TempSrc:   PainSc: 0-No pain      Patients Stated Pain Goal: 3 ([id number])  Complications: No apparent anesthesia complications

## 2020-02-20 NOTE — Op Note (Signed)
Surgeon: Surgeon(s): Felipa Furnace, DPM  Assistants: None Pre-operative diagnosis: abscess right great toe  Post-operative diagnosis: same Procedure: Procedure(s) (LRB): AMPUTATION RIGHT GREAT  TOE (Right)  Pathology:  ID Type Source Tests Collected by Time Destination  1 : Right great toe and bone DIRTY Tissue Other Source SURGICAL PATHOLOGY Felipa Furnace, DPM 02/20/2020 1148     Pertinent Intra-op findings: Soft tissue necrosis of the right hallux noted.  The necrosis was carried down to the distal phalanx as well as head of the proximal phalanx.  Poor bleeding noted. Anesthesia: MAC with 10 cc of half percent Marcaine pain local Hemostasis: * No tourniquets in log * EBL:  Minimal Materials: 3-0 Prolene Injectables: None Complications: None  Indications for surgery: A 59 y.o. female presents with right hallux soft tissue infection with cellulitis.  Patient underwent incision and drainage by Dr. Jacqualyn Posey with debridement.  It was determined that the hallux itself on the right side was nonsalvageable.  However patient was hesitant to have it removed.  Today patient is amenable and was consented for surgery for removal of the right hallux.  Patient has failed all conservative therapy including but not limited to IV antibiotics and local wound care. She wishes to have surgical correction of the foot/deformity. It was determined that patient would benefit from right hallux amputation with disarticulation at metatarsophalangeal joint. Informed surgical risk consent was reviewed and read aloud to the patient.  I reviewed the films.  I have discussed my findings with the patient in great detail.  I have discussed all risks including but not limited to infection, stiffness, scarring, limp, disability, deformity, damage to blood vessels and nerves, numbness, poor healing, need for braces, arthritis, chronic pain, amputation, death.  All benefits and realistic expectations discussed in great detail.  I  have made no promises as to the outcome.  I have provided realistic expectations.  I have offered the patient a 2nd opinion, which they have declined and assured me they preferred to proceed despite the risks   Procedure in detail: The patient was both verbally and visually identified by myself, the nursing staff, and anesthesia staff in the preoperative holding area. They were then transferred to the operating room and placed on the operative table in supine position.  Attention was directed to the right great toe, using a skin marker a fishmouth style incision was delineated.  Using a #15 blade the incision was carried down from skin down to bone.  The metatarsophalangeal joint first was identified and disarticulated.  The toe was sent to pathology.  Clean margins of the base of the proximal phalanx were taken one was sent to microbiology and other 1 to pathology.  The wound appeared clear of infection.  3 L saline pulse lavage was used to irrigate the wound.  No further infection was visualized.  No infection was tracking along the tendons.  All bleeders were ligated with Bovie.  It is important to note that not much bleeding was noted.  It was determined that wound can be primarily closed.  The wound was closed with 3-0 Prolene in simple interrupted techniques.  The foot was dressed with Betadine wet-to-dry gauze Kerlix and Ace bandage. Patient will be ambulating with surgical shoe.  At the conclusion of the procedure the patient was awoken from anesthesia and found to have tolerated the procedure well any complications. There were transferred to PACU with vital signs stable and vascular status intact.  Boneta Lucks, DPM

## 2020-02-20 NOTE — Anesthesia Preprocedure Evaluation (Addendum)
Anesthesia Evaluation  Patient identified by MRN, date of birth, ID band Patient awake    Reviewed: Allergy & Precautions, NPO status , Patient's Chart, lab work & pertinent test results, reviewed documented beta blocker date and time   History of Anesthesia Complications Negative for: history of anesthetic complications  Airway Mallampati: I  TM Distance: >3 FB Neck ROM: Full    Dental  (+) Edentulous Upper, Edentulous Lower   Pulmonary shortness of breath, COPD, former smoker,  02/16/2020 SARS Coronavirus NEG   breath sounds clear to auscultation       Cardiovascular hypertension, Pt. on home beta blockers and Pt. on medications (-) angina+ CAD and + CABG (2017)   Rhythm:Regular Rate:Normal  '17 ECHO: mild LVH, Mildly decreased left ventricular systolic function, ejection fraction 45% with inferior akinesis  Degenerative mitral valve disease  Dilated left atrium - mild  Aortic sclerosis  Normal right ventricular systolic function  No apparent valvular vegetations   Neuro/Psych negative neurological ROS     GI/Hepatic Neg liver ROS, GERD  Medicated and Controlled,  Endo/Other  diabetes (glu 250), Oral Hypoglycemic Agents, Insulin DependentMorbid obesity  Renal/GU Renal InsufficiencyRenal disease     Musculoskeletal   Abdominal (+) + obese,   Peds  Hematology  (+) Blood dyscrasia (Hb 9.6), ,   Anesthesia Other Findings   Reproductive/Obstetrics                            Anesthesia Physical Anesthesia Plan  ASA: III  Anesthesia Plan: MAC   Post-op Pain Management:    Induction:   PONV Risk Score and Plan: 2 and Ondansetron and Treatment may vary due to age or medical condition  Airway Management Planned: Natural Airway and Simple Face Mask  Additional Equipment:   Intra-op Plan:   Post-operative Plan:   Informed Consent: I have reviewed the patients History and  Physical, chart, labs and discussed the procedure including the risks, benefits and alternatives for the proposed anesthesia with the patient or authorized representative who has indicated his/her understanding and acceptance.       Plan Discussed with: CRNA and Surgeon  Anesthesia Plan Comments:        Anesthesia Quick Evaluation

## 2020-02-20 NOTE — Anesthesia Postprocedure Evaluation (Signed)
Anesthesia Post Note  Patient: Jasmine Richardson  Procedure(s) Performed: AMPUTATION RIGHT GREAT  TOE (Right Toe)     Patient location during evaluation: PACU Anesthesia Type: MAC Level of consciousness: awake and alert, patient cooperative and oriented Pain management: pain level controlled Vital Signs Assessment: post-procedure vital signs reviewed and stable Respiratory status: respiratory function stable, spontaneous breathing, nonlabored ventilation and patient connected to nasal cannula oxygen Cardiovascular status: blood pressure returned to baseline and stable Postop Assessment: no apparent nausea or vomiting Anesthetic complications: no    Last Vitals:  Vitals:   02/20/20 1215 02/20/20 1231  BP: 121/66 118/73  Pulse: 66 66  Resp: 18 18  Temp: 36.8 C 36.8 C  SpO2: 98% 94%    Last Pain:  Vitals:   02/20/20 1231  TempSrc: Oral  PainSc:                  Chriselda Leppert,E. Cypress Hinkson

## 2020-02-20 NOTE — Interval H&P Note (Signed)
History and Physical Interval Note:  02/20/2020 10:54 AM  Jasmine Richardson  has presented today for surgery, with the diagnosis of abscess right .  The various methods of treatment have been discussed with the patient and family. After consideration of risks, benefits and other options for treatment, the patient has consented to  Procedure(s): AMPUTATION TOE (Right) as a surgical intervention.  The patient's history has been reviewed, patient examined, no change in status, stable for surgery.  I have reviewed the patient's chart and labs.  Questions were answered to the patient's satisfaction.     Felipa Furnace

## 2020-02-20 NOTE — Plan of Care (Signed)
  Problem: Skin Integrity: Goal: Risk for impaired skin integrity will decrease Outcome: Progressing   Problem: Safety: Goal: Ability to remain free from injury will improve Outcome: Progressing   Problem: Pain Managment: Goal: General experience of comfort will improve Outcome: Progressing   Problem: Activity: Goal: Risk for activity intolerance will decrease Outcome: Progressing   Problem: Clinical Measurements: Goal: Will remain free from infection Outcome: Progressing   Problem: Education: Goal: Knowledge of General Education information will improve Description: Including pain rating scale, medication(s)/side effects and non-pharmacologic comfort measures Outcome: Progressing

## 2020-02-21 LAB — AEROBIC CULTURE W GRAM STAIN (SUPERFICIAL SPECIMEN)

## 2020-02-21 LAB — BASIC METABOLIC PANEL
Anion gap: 10 (ref 5–15)
BUN: 25 mg/dL — ABNORMAL HIGH (ref 6–20)
CO2: 29 mmol/L (ref 22–32)
Calcium: 7.8 mg/dL — ABNORMAL LOW (ref 8.9–10.3)
Chloride: 101 mmol/L (ref 98–111)
Creatinine, Ser: 1.09 mg/dL — ABNORMAL HIGH (ref 0.44–1.00)
GFR calc Af Amer: >60 mL/min (ref >60)
GFR calc non Af Amer: 56 mL/min — ABNORMAL LOW (ref >60)
Glucose, Bld: 106 mg/dL — ABNORMAL HIGH (ref 70–99)
Potassium: 3.7 mmol/L (ref 3.5–5.1)
Sodium: 140 mmol/L (ref 135–145)

## 2020-02-21 LAB — CULTURE, BLOOD (ROUTINE X 2)
Culture: NO GROWTH
Culture: NO GROWTH
Special Requests: ADEQUATE

## 2020-02-21 LAB — GLUCOSE, CAPILLARY
Glucose-Capillary: 207 mg/dL — ABNORMAL HIGH (ref 70–99)
Glucose-Capillary: 260 mg/dL — ABNORMAL HIGH (ref 70–99)

## 2020-02-21 MED ORDER — CIPROFLOXACIN HCL 500 MG PO TABS: 500.0000 mg | ORAL_TABLET | Freq: Two times a day (BID) | ORAL | 0 refills | Status: AC

## 2020-02-21 MED ORDER — BASAGLAR KWIKPEN 100 UNIT/ML ~~LOC~~ SOPN: 20.0000 [IU] | PEN_INJECTOR | Freq: Every day | SUBCUTANEOUS | 0 refills | Status: DC

## 2020-02-21 MED ORDER — TRAMADOL HCL 50 MG PO TABS: 50.0000 mg | ORAL_TABLET | Freq: Four times a day (QID) | ORAL | 0 refills | Status: DC | PRN

## 2020-02-21 MED ORDER — CLINDAMYCIN HCL 300 MG PO CAPS: 300.0000 mg | ORAL_CAPSULE | Freq: Three times a day (TID) | ORAL | 0 refills | Status: AC

## 2020-02-21 NOTE — Plan of Care (Signed)

## 2020-02-21 NOTE — Progress Notes (Signed)
Entered patient room d/t bed alarm.  Pt in bed, removed gown, personal items spread out on bed, on the phone.  Pt tearful; reports she was very confused and called her brother.  Re-oriented, Assisted pt with gown and belongings, replaced case on her phone.  Bed alarm on, door open for light to help with orientation.

## 2020-02-21 NOTE — Discharge Summary (Signed)
Physician Discharge Summary  Kaliannah Calva [id number] DOB: 23-Mar-1961 DOA: 02/16/2020  PCP: Ernestene Kiel, MD  Admit date: 02/16/2020 Discharge date: 02/21/2020  Admitted From: Home. Disposition: Home.  Recommendations for Outpatient Follow-up:  1. Follow up with PCP in 1-2 weeks 2. Please obtain BMP/CBC in one week 3. Follow-up with podiatry surgery as scheduled   Discharge Condition: Stable CODE STATUS: Full code Diet recommendation: Low-carb diet  Discharge summary: 59 year old female with history of coronary artery disease, chronic diastolic heart failure, hypertension, hyperlipidemia, type 2 diabetes uncontrolled presented to the hospital from podiatry surgery office with right great toe wound of about 2 weeks.  Also noted some fever at home.  Also had some pain on left inner thigh. In the emergency room she was found to have leukocytosis.  Started on antibiotics and admitted.  Diabetic foot ulcer right toe, left inner thigh abscess and cellulitis: Sepsis present on admission.  Resolved and improved. Had significant localized infection. Surgical debridement followed by right metatarsophalangeal amputation and closure of the wound by podiatry surgery, postop surgery plan with pain management, broad-spectrum antibiotics and follow-up in 1 week. Left inner thigh wound with less than 2 cm firm swelling, small pinpoint spontaneous opening abscess, negative cultures.  Broad-spectrum antibiotic, hot compress and mobility.  No indication for incision and drainage. Cultures with a Streptococcus from toe.  Will treat with broad-spectrum including clindamycin and ciprofloxacin due to underlying history of uncontrolled diabetes and multimicrobial infection.  Uncontrolled type 2 diabetes with hyperglycemia: A1c more than 11.  Recently started on insulin. Patient is on multiple regimen including Lantus 10 units at night, glipizide 10 mg twice a day, Metformin 500 mg twice a day, Januvia 100  mg once a day.  Her blood sugars has been high and with suboptimal control.  In the hospital she needed about 80 units of insulin a day. Patient is having hard time buying  her Januvia prescriptions, to simplify her regimen we can drop this off. She will continue glipizide and Metformin. We will increase her nighttime Lantus to 20 units daily, she may further need to go up on Lantus and simplify her regimen.  I suggested to keep a logbook at home and follow-up with primary care physician.  Other chronic medical problems remained stable.  She is on Plavix.  She is on Toprol XL and ramipril that she will continue.  Medically stable.  Going home today with surgical follow-up.  Discharge Diagnoses:  Principal Problem:   Diabetic foot ulcer ([place]) Active Problems:   Chronic diastolic heart failure (HCC)   Coronary artery disease involving native coronary artery of native heart with angina pectoris (HCC)   Essential hypertension   GERD (gastroesophageal reflux disease)   Hyperlipidemia   Morbid obesity (HCC)   S/P CABG (coronary artery bypass graft)   Uncontrolled type 2 diabetes mellitus [facility])    Discharge Instructions  Discharge Instructions    Call MD for:  severe uncontrolled pain   Complete by: As directed    Call MD for:  temperature >100.4   Complete by: As directed    Diet Carb Modified   Complete by: As directed    Increase activity slowly   Complete by: As directed      Allergies as of 02/21/2020      Reactions   Codeine Rash      Medication List    TAKE these medications   Basaglar KwikPen 100 UNIT/ML Inject 0.2 mLs (20 Units total) into the skin at bedtime. What  changed:   how much to take  when to take this   ciprofloxacin 500 MG tablet Commonly known as: CIPRO Take 1 tablet (500 mg total) by mouth 2 (two) times daily for 14 days.   clindamycin 300 MG capsule Commonly known as: CLEOCIN Take 1 capsule (300 mg total) by mouth 3 (three) times daily for 14  days.   clopidogrel 75 MG tablet Commonly known as: PLAVIX Take 75 mg by mouth daily.   furosemide 40 MG tablet Commonly known as: LASIX Take 1 tablet by mouth once daily   glipiZIDE 10 MG tablet Commonly known as: GLUCOTROL Take 10 mg by mouth 2 (two) times daily.   Januvia 100 MG tablet Generic drug: sitaGLIPtin Take 100 mg by mouth daily.   LORazepam 1 MG tablet Commonly known as: ATIVAN Take 1 mg by mouth as needed for anxiety.   metFORMIN 500 MG 24 hr tablet Commonly known as: GLUCOPHAGE-XR Take 1,000 mg by mouth 2 (two) times daily.   metoprolol succinate 50 MG 24 hr tablet Commonly known as: TOPROL-XL Take 150 mg by mouth daily.   neomycin-polymyxin-dexameth 0.1 % Oint Commonly known as: MAXITROL Place 1 application into the left eye as needed.   nitroGLYCERIN 0.4 MG SL tablet Commonly known as: NITROSTAT Place 1 tablet (0.4 mg total) under the tongue every 5 (five) minutes x 3 doses as needed for chest pain.   omeprazole 20 MG capsule Commonly known as: PRILOSEC Take 1 capsule (20 mg total) by mouth daily.   pravastatin 20 MG tablet Commonly known as: PRAVACHOL TAKE 1 TABLET BY MOUTH ONCE DAILY IN THE EVENING   ramipril 1.25 MG capsule Commonly known as: ALTACE Take 1.25 mg by mouth 2 (two) times daily.   traMADol 50 MG tablet Commonly known as: ULTRAM Take 1 tablet (50 mg total) by mouth every 6 (six) hours as needed for up to 5 days for moderate pain.   VITAMIN B 12 PO Take 1 tablet by mouth daily.   vitamin C 250 MG tablet Commonly known as: ASCORBIC ACID Take 250 mg by mouth daily.   Vitamin D (Ergocalciferol) 1.25 MG (50000 UNIT) Caps capsule Commonly known as: DRISDOL Take 50,000 Units by mouth every 7 (seven) days. Saturdays       Allergies  Allergen Reactions  . Codeine Rash    Consultations:  Podiatry surgery  Diabetic coordinator   Procedures/Studies: DG Chest 2 View  Result Date: 02/16/2020 CLINICAL DATA:  Possible  sepsis EXAM: CHEST - 2 VIEW COMPARISON:  01/22/2020 FINDINGS: The heart size and mediastinal contours are within normal limits. No new consolidation or edema. No pleural effusion or pneumothorax. The visualized skeletal structures are unremarkable. IMPRESSION: No acute process in the chest. Electronically Signed   By: Macy Mis M.D.   On: 02/16/2020 13:50   CT FEMUR LEFT W CONTRAST  Result Date: 02/16/2020 CLINICAL DATA:  Abscess of left thigh.  Fever. EXAM: CT OF THE LOWER RIGHT EXTREMITY WITH CONTRAST TECHNIQUE: Multidetector CT imaging of the lower right extremity was performed according to the standard protocol following intravenous contrast administration. COMPARISON:  None. CONTRAST:  175mL OMNIPAQUE IOHEXOL 300 MG/ML  SOLN FINDINGS: Bones/Joint/Cartilage No acute abnormalities of the left hip or left knee or left femur. Slight arthritic changes of the left knee. Minimal left knee effusion. Ligaments Suboptimally assessed by CT. The ligaments of the left knee appear intact. Muscles and Tendons There is soft tissue stranding superficial to the proximal left gracilis muscle. There is thickening  of the skin overlying this area. There is no definable abscess. There is no discrete myositis. The other muscles of the visualized portion of the left leg appear normal. Soft tissues Skin thickening and soft tissue stranding in the subcutaneous fat of the medial aspect of the proximal left thigh consistent with cellulitis. No definable abscess. Prominent varicose veins in the medial aspect of the left thigh. CLINICAL DATA:  Abscess of left thigh. Fever. EXAM: CT OF THE LOWER RIGHT EXTREMITY WITH CONTRAST TECHNIQUE: Multidetector CT imaging of the lower right extremity was performed according to the standard protocol following intravenous contrast administration. COMPARISON:  None. CONTRAST:  160mL OMNIPAQUE IOHEXOL 300 MG/ML  SOLN FINDINGS: Bones/Joint/Cartilage No acute abnormalities of the left hip or left knee  or left femur. Slight arthritic changes of the left knee. Minimal left knee effusion. Ligaments Suboptimally assessed by CT. The ligaments of the left knee appear intact. Muscles and Tendons There is soft tissue stranding superficial to the proximal left gracilis muscle. There is thickening of the skin overlying this area. There is no definable abscess. There is no discrete myositis. The other muscles of the visualized portion of the left leg appear normal. Soft tissues Skin thickening and soft tissue stranding in the subcutaneous fat of the medial aspect of the proximal left thigh consistent with cellulitis. No definable abscess. Prominent varicose veins in the medial aspect of the left thigh. IMPRESSION: 1. Findings consistent with cellulitis of the medial aspect of the proximal left thigh. This area extends from the level of the symphysis pubis of approximately 15 cm distally superficial to the gracilis muscle. 2. No definable abscess. 3. No evidence of myositis or osteomyelitis. 4. Minimal left knee effusion. 5. Prominent varicose veins in the medial aspect of the left thigh. Electronically Signed   By: Lorriane Shire M.D.   On: 02/16/2020 19:12   MR TOES RIGHT WO CONTRAST  Result Date: 02/18/2020 CLINICAL DATA:  The patient noticed a blister on her right great toe 2 weeks ago. Pain and swelling. EXAM: MRI OF THE RIGHT TOES WITHOUT CONTRAST TECHNIQUE: Multiplanar, multisequence MR imaging of the right toes was performed. No intravenous contrast was administered. COMPARISON:  Plain films of the right foot 02/16/2020. FINDINGS: Bones/Joint/Cartilage Patient motion degrades the examination despite repeating sequences and using motion reduction techniques. There is no bone marrow signal abnormality to suggest osteomyelitis. No joint effusion. No evidence of arthropathy. Ligaments Intact. Muscles and Tendons No intramuscular fluid collection. Intermediate increased T2 signal in all intrinsic musculature the foot  is most consistent with diabetic myopathy. Soft tissues Skin wound is seen at the level of the IP joint of the great toe on the medial side. Underlying subcutaneous edema is present. A thin fluid collection along the plantar surface of the distal phalanx measures 1 cm transverse by 0.3 cm craniocaudal by 1.2 cm long. The collection is contiguous with the underlying distal phalanx. IMPRESSION: Motion degraded examination. Skin ulceration at the IP joint of the great toe. There is no evidence of osteomyelitis but a thin fluid collection contiguous with the undersurface of the distal phalanx is worrisome for abscess. Electronically Signed   By: Inge Rise M.D.   On: 02/18/2020 08:34   DG Foot Complete Right  Result Date: 02/16/2020 CLINICAL DATA:  Right foot wound. EXAM: RIGHT FOOT COMPLETE - 3+ VIEW COMPARISON:  None. FINDINGS: There is no evidence of fracture or dislocation. There is no evidence of arthropathy or other focal bone abnormality. Plantar and Achilles enthesophytes. Osteopenia.  Soft tissues are unremarkable. IMPRESSION: No acute osseous abnormality. Electronically Signed   By: Titus Dubin M.D.   On: 02/16/2020 15:17   VAS [place] ABI WITH/WO TBI  Result Date: 02/18/2020 LOWER EXTREMITY DOPPLER STUDY Indications: Ulceration, and PVD. High Risk         Hypertension, hyperlipidemia, Diabetes, coronary artery Factors:          disease.  Comparison Study: no prior Performing Technologist: Abram Sander RVS  Examination Guidelines: A complete evaluation includes at minimum, Doppler waveform signals and systolic blood pressure reading at the level of bilateral brachial, anterior tibial, and posterior tibial arteries, when vessel segments are accessible. Bilateral testing is considered an integral part of a complete examination. Photoelectric Plethysmograph (PPG) waveforms and toe systolic pressure readings are included as required and additional duplex testing as needed. Limited examinations for  reoccurring indications may be performed as noted.  ABI Findings: +--------+------------------+-----+---------+--------+ Right   Rt Pressure (mmHg)IndexWaveform Comment  +--------+------------------+-----+---------+--------+ [id number]                    triphasic         +--------+------------------+-----+---------+--------+ PTA     128               0.90 biphasic          +--------+------------------+-----+---------+--------+ DP      129               0.91 triphasic         +--------+------------------+-----+---------+--------+ +--------+------------------+-----+---------+-------+ Left    Lt Pressure (mmHg)IndexWaveform Comment +--------+------------------+-----+---------+-------+ [id number]                    biphasic         +--------+------------------+-----+---------+-------+ PTA     141               0.99 biphasic         +--------+------------------+-----+---------+-------+ DP      135               0.95 triphasic        +--------+------------------+-----+---------+-------+ +-------+-----------+-----------+------------+------------+ ABI/TBIToday's ABIToday's TBIPrevious ABIPrevious TBI +-------+-----------+-----------+------------+------------+ Right  0.91                                           +-------+-----------+-----------+------------+------------+ Left   0.99                                           +-------+-----------+-----------+------------+------------+  Summary: Right: Resting right ankle-brachial index indicates mild right lower extremity arterial disease. Left: Resting left ankle-brachial index is within normal range. No evidence of significant left lower extremity arterial disease.  *See table(s) above for measurements and observations.  Electronically signed by Servando Snare MD on 02/18/2020 at 1:17:10 PM.   Final     Subjective: Patient seen and examined.  Overnight she woke up with some confusion, she had a rough night  with IV lines going bad. Husband at the bedside.  She wants to get out of the hospital and recover at home.  Remains afebrile.   Discharge Exam: Vitals:   02/21/20 0835 02/21/20 0905  BP: 129/69   Pulse: 77   Resp: 18 18  Temp: 99.2 F (37.3 C)   SpO2: 97%  Vitals:   02/21/20 0049 02/21/20 0500 02/21/20 0835 02/21/20 0905  BP: 121/63 124/70 129/69   Pulse: 75 75 77   Resp: 18 18 18 18   Temp: 100.3 F (37.9 C) 98.4 F (36.9 C) 99.2 F (37.3 C)   TempSrc: Oral Oral Oral   SpO2: 91% 98% 97%   Weight:      Height:        General: Pt is alert, awake, not in acute distress, walking to the bathroom with help. Cardiovascular: RRR, S1/S2 +, no rubs, no gallops Respiratory: CTA bilaterally, no wheezing, no rhonchi Abdominal: Soft, NT, ND, bowel sounds +, obese and pendulous. Extremities: Right foot immediate postop dressing, proximal foot without erythema. Left inner thigh with 2 x 2 centimeter induration, nontender, negative for fluctuation, no surrounding erythema or induration.    The results of significant diagnostics from this hospitalization (including imaging, microbiology, ancillary and laboratory) are listed below for reference.     Microbiology: Recent Results (from the past 240 hour(s))  Culture, blood (Routine x 2)     Status: None   Collection Time: 02/16/20  2:04 PM   Specimen: BLOOD  Result Value Ref Range Status   Specimen Description BLOOD RIGHT ANTECUBITAL  Final   Special Requests   Final    BOTTLES DRAWN AEROBIC AND ANAEROBIC Blood Culture results may not be optimal due to an inadequate volume of blood received in culture bottles   Culture   Final    NO GROWTH 5 DAYS Performed at [facility], [place] [address], [place], [place] [address]    Report Status 02/21/2020 FINAL  Final  Culture, blood (Routine x 2)     Status: None   Collection Time: 02/16/20  3:46 PM   Specimen: BLOOD  Result Value Ref Range Status   Specimen Description BLOOD LEFT  ANTECUBITAL  Final   Special Requests   Final    BOTTLES DRAWN AEROBIC AND ANAEROBIC Blood Culture adequate volume   Culture   Final    NO GROWTH 5 DAYS Performed at [facility], [place] [address], [place], [place] [address]    Report Status 02/21/2020 FINAL  Final  Respiratory Panel by RT PCR (Flu A&B, Covid) - Nasopharyngeal Swab     Status: None   Collection Time: 02/16/20  3:58 PM   Specimen: Nasopharyngeal Swab  Result Value Ref Range Status   SARS Coronavirus 2 by RT PCR NEGATIVE NEGATIVE Final    Comment: (NOTE) SARS-CoV-2 target nucleic acids are NOT DETECTED. The SARS-CoV-2 RNA is generally detectable in upper respiratoy specimens during the acute phase of infection. The lowest concentration of SARS-CoV-2 viral copies this assay can detect is 131 copies/mL. A negative result does not preclude SARS-Cov-2 infection and should not be used as the sole basis for treatment or other patient management decisions. A negative result may occur with  improper specimen collection/handling, submission of specimen other than nasopharyngeal swab, presence of viral mutation(s) within the areas targeted by this assay, and inadequate number of viral copies (<131 copies/mL). A negative result must be combined with clinical observations, patient history, and epidemiological information. The expected result is Negative. Fact Sheet for Patients:  PinkCheek.be Fact Sheet for Healthcare Providers:  GravelBags.it This test is not yet ap proved or cleared by the [place] FDA and  has been authorized for detection and/or diagnosis of SARS-CoV-2 by FDA under an Emergency Use Authorization (EUA). This EUA will remain  in effect (meaning this test can  be used) for the duration of the COVID-19 declaration under Section 564(b)(1) of the Act, 21 U.S.C. section 360bbb-3(b)(1), unless the authorization is terminated or revoked sooner.     Influenza A by PCR NEGATIVE NEGATIVE Final   Influenza B by PCR NEGATIVE NEGATIVE Final    Comment: (NOTE) The Xpert Xpress SARS-CoV-2/FLU/RSV assay is intended as an aid in  the diagnosis of influenza from Nasopharyngeal swab specimens and  should not be used as a sole basis for treatment. Nasal washings and  aspirates are unacceptable for Xpert Xpress SARS-CoV-2/FLU/RSV  testing. Fact Sheet for Patients: PinkCheek.be Fact Sheet for Healthcare Providers: GravelBags.it This test is not yet approved or cleared by the [place] FDA and  has been authorized for detection and/or diagnosis of SARS-CoV-2 by  FDA under an Emergency Use Authorization (EUA). This EUA will remain  in effect (meaning this test can be used) for the duration of the  Covid-19 declaration under Section 564(b)(1) of the Act, 21  U.S.C. section 360bbb-3(b)(1), unless the authorization is  terminated or revoked. Performed at [facility], [place] [address] [place], [place] [address]   Aerobic Culture (superficial specimen)     Status: Abnormal   Collection Time: 02/18/20 10:31 AM   Specimen: Abscess  Result Value Ref Range Status   Specimen Description ABSCESS  Final   Special Requests NONE  Final   Gram Stain   Final    RARE WBC PRESENT, PREDOMINANTLY PMN FEW GRAM POSITIVE COCCI IN PAIRS RARE GRAM NEGATIVE RODS    Culture (A)  Final    STREPTOCOCCUS AGALACTIAE STREPTOCOCCUS ANGINOSIS TESTING AGAINST S. AGALACTIAE NOT ROUTINELY PERFORMED DUE TO PREDICTABILITY OF AMP/PEN/VAN SUSCEPTIBILITY. Performed at [facility], [place] [address] [place], [place] [address]    Report Status 02/21/2020 FINAL  Final   Organism ID, Bacteria STREPTOCOCCUS AGALACTIAE  Final      Susceptibility   Streptococcus agalactiae - MIC*    CLINDAMYCIN <=0.25 SENSITIVE Sensitive     AMPICILLIN <=0.25 SENSITIVE Sensitive     ERYTHROMYCIN <=0.12 SENSITIVE Sensitive      VANCOMYCIN 0.5 SENSITIVE Sensitive     CEFTRIAXONE 0.5 SENSITIVE Sensitive     LEVOFLOXACIN 0.5 SENSITIVE Sensitive     * STREPTOCOCCUS AGALACTIAE  MRSA PCR Screening     Status: None   Collection Time: 02/18/20  8:01 PM   Specimen: Nasopharyngeal  Result Value Ref Range Status   MRSA by PCR NEGATIVE NEGATIVE Final    Comment:        The GeneXpert MRSA Assay (FDA approved for NASAL specimens only), is one component of a comprehensive MRSA colonization surveillance program. It is not intended to diagnose MRSA infection nor to guide or monitor treatment for MRSA infections. Performed at [facility], [place] [address] [place], [place] [address]   Aerobic/Anaerobic Culture (surgical/deep wound)     Status: None (Preliminary result)   Collection Time: 02/19/20  1:52 PM   Specimen: Abscess  Result Value Ref Range Status   Specimen Description ABSCESS RIGHT TOE GREAT  Final   Special Requests NONE  Final   Gram Stain   Final    RARE WBC PRESENT, PREDOMINANTLY PMN MODERATE GRAM NEGATIVE RODS FEW GRAM POSITIVE COCCI Performed at [facility], [address] [place], [place] [address]    Culture   Final    MODERATE STREPTOCOCCUS AGALACTIAE TESTING AGAINST S. AGALACTIAE NOT ROUTINELY PERFORMED DUE TO PREDICTABILITY OF AMP/PEN/VAN SUSCEPTIBILITY. NO ANAEROBES ISOLATED; CULTURE IN  PROGRESS FOR 5 DAYS    Report Status PENDING  Incomplete  Aerobic/Anaerobic Culture (surgical/deep wound)     Status: None (Preliminary result)   Collection Time: 02/20/20 12:03 PM   Specimen: Other Source; Tissue  Result Value Ref Range Status   Specimen Description TISSUE RIGHT TOE  Final   Special Requests PT ON FLAGYL, ROCEPHIN,VANCOMYCIN  Final   Gram Stain   Final    RARE WBC PRESENT,BOTH PMN AND MONONUCLEAR RARE GRAM NEGATIVE RODS RARE GRAM POSITIVE COCCI IN PAIRS Performed at [facility], [place] [address] [place], [place] [address]    Culture PENDING  Incomplete   Report  Status PENDING  Incomplete     Labs: BNP (last 3 results) No results for input(s): BNP in the last 8760 hours. Basic Metabolic Panel: Recent Labs  Lab 02/17/20 0523 02/18/20 0407 02/19/20 0510 02/20/20 0449 02/21/20 0411  NA 132* 131* 133* 135 140  K 3.8 4.3 3.3* 3.8 3.7  CL 96* 99 98 100 101  CO2 22 21* 25 25 29   GLUCOSE 383* 282* 290* 262* 106*  BUN 6 9 5* <5* 25*  CREATININE 0.77 0.77 0.68 0.67 1.09*  CALCIUM 8.7* 7.9* 8.0* 7.9* 7.8*  MG  --  1.1* 1.6*  --   --   PHOS  --  2.4*  --   --   --    Liver Function Tests: Recent Labs  Lab 02/16/20 1327  AST 12*  ALT 11  ALKPHOS 62  BILITOT 0.7  PROT 7.8  ALBUMIN 3.1*   No results for input(s): LIPASE, AMYLASE in the last 168 hours. No results for input(s): AMMONIA in the last 168 hours. CBC: Recent Labs  Lab 02/16/20 1327 02/17/20 0523 02/18/20 0407 02/19/20 0510 02/20/20 0449  WBC 18.5* 15.8* 15.7* 11.5* 12.5*  NEUTROABS 15.5*  --  13.0* 9.4* 9.9*  HGB 11.1* 10.1* 9.5* 9.5* 9.6*  HCT 34.8* 30.4* 28.8* 29.3* 29.9*  MCV 89.7 87.1 87.5 86.4 86.9  PLT 425* 317 367 391 416*   Cardiac Enzymes: No results for input(s): CKTOTAL, CKMB, CKMBINDEX, TROPONINI in the last 168 hours. BNP: Invalid input(s): POCBNP CBG: Recent Labs  Lab 02/20/20 1338 02/20/20 1627 02/20/20 2035 02/21/20 0616 02/21/20 0926  GLUCAP 291* 272* 188* 207* 260*   D-Dimer No results for input(s): DDIMER in the last 72 hours. Hgb A1c No results for input(s): HGBA1C in the last 72 hours. Lipid Profile No results for input(s): CHOL, HDL, LDLCALC, TRIG, CHOLHDL, LDLDIRECT in the last 72 hours. Thyroid function studies No results for input(s): TSH, T4TOTAL, T3FREE, THYROIDAB in the last 72 hours.  Invalid input(s): FREET3 Anemia work up No results for input(s): VITAMINB12, FOLATE, FERRITIN, TIBC, IRON, RETICCTPCT in the last 72 hours. Urinalysis    Component Value Date/Time   COLORURINE STRAW (A) 02/16/2020 1453   APPEARANCEUR  CLEAR 02/16/2020 1453   LABSPEC 1.032 (H) 02/16/2020 1453   PHURINE 6.0 02/16/2020 1453   GLUCOSEU >=500 (A) 02/16/2020 1453   HGBUR NEGATIVE 02/16/2020 1453   BILIRUBINUR NEGATIVE 02/16/2020 1453   KETONESUR NEGATIVE 02/16/2020 1453   PROTEINUR 30 (A) 02/16/2020 1453   NITRITE NEGATIVE 02/16/2020 1453   LEUKOCYTESUR NEGATIVE 02/16/2020 1453   Sepsis Labs Invalid input(s): PROCALCITONIN,  WBC,  LACTICIDVEN Microbiology Recent Results (from the past 240 hour(s))  Culture, blood (Routine x 2)     Status: None   Collection Time: 02/16/20  2:04 PM   Specimen: BLOOD  Result Value Ref Range Status  Specimen Description BLOOD RIGHT ANTECUBITAL  Final   Special Requests   Final    BOTTLES DRAWN AEROBIC AND ANAEROBIC Blood Culture results may not be optimal due to an inadequate volume of blood received in culture bottles   Culture   Final    NO GROWTH 5 DAYS Performed at [facility], [place] [address], [place], [place] [address]    Report Status 02/21/2020 FINAL  Final  Culture, blood (Routine x 2)     Status: None   Collection Time: 02/16/20  3:46 PM   Specimen: BLOOD  Result Value Ref Range Status   Specimen Description BLOOD LEFT ANTECUBITAL  Final   Special Requests   Final    BOTTLES DRAWN AEROBIC AND ANAEROBIC Blood Culture adequate volume   Culture   Final    NO GROWTH 5 DAYS Performed at [facility], [place] [address], [place], [place] [address]    Report Status 02/21/2020 FINAL  Final  Respiratory Panel by RT PCR (Flu A&B, Covid) - Nasopharyngeal Swab     Status: None   Collection Time: 02/16/20  3:58 PM   Specimen: Nasopharyngeal Swab  Result Value Ref Range Status   SARS Coronavirus 2 by RT PCR NEGATIVE NEGATIVE Final    Comment: (NOTE) SARS-CoV-2 target nucleic acids are NOT DETECTED. The SARS-CoV-2 RNA is generally detectable in upper respiratoy specimens during the acute phase of infection. The lowest concentration of SARS-CoV-2 viral copies this assay  can detect is 131 copies/mL. A negative result does not preclude SARS-Cov-2 infection and should not be used as the sole basis for treatment or other patient management decisions. A negative result may occur with  improper specimen collection/handling, submission of specimen other than nasopharyngeal swab, presence of viral mutation(s) within the areas targeted by this assay, and inadequate number of viral copies (<131 copies/mL). A negative result must be combined with clinical observations, patient history, and epidemiological information. The expected result is Negative. Fact Sheet for Patients:  PinkCheek.be Fact Sheet for Healthcare Providers:  GravelBags.it This test is not yet ap proved or cleared by the [place] FDA and  has been authorized for detection and/or diagnosis of SARS-CoV-2 by FDA under an Emergency Use Authorization (EUA). This EUA will remain  in effect (meaning this test can be used) for the duration of the COVID-19 declaration under Section 564(b)(1) of the Act, 21 U.S.C. section 360bbb-3(b)(1), unless the authorization is terminated or revoked sooner.    Influenza A by PCR NEGATIVE NEGATIVE Final   Influenza B by PCR NEGATIVE NEGATIVE Final    Comment: (NOTE) The Xpert Xpress SARS-CoV-2/FLU/RSV assay is intended as an aid in  the diagnosis of influenza from Nasopharyngeal swab specimens and  should not be used as a sole basis for treatment. Nasal washings and  aspirates are unacceptable for Xpert Xpress SARS-CoV-2/FLU/RSV  testing. Fact Sheet for Patients: PinkCheek.be Fact Sheet for Healthcare Providers: GravelBags.it This test is not yet approved or cleared by the [place] FDA and  has been authorized for detection and/or diagnosis of SARS-CoV-2 by  FDA under an Emergency Use Authorization (EUA). This EUA will remain  in effect  (meaning this test can be used) for the duration of the  Covid-19 declaration under Section 564(b)(1) of the Act, 21  U.S.C. section 360bbb-3(b)(1), unless the authorization is  terminated or revoked. Performed at [facility], [place] [address], [place], [place] [address]   Aerobic Culture (superficial specimen)     Status:  Abnormal   Collection Time: 02/18/20 10:31 AM   Specimen: Abscess  Result Value Ref Range Status   Specimen Description ABSCESS  Final   Special Requests NONE  Final   Gram Stain   Final    RARE WBC PRESENT, PREDOMINANTLY PMN FEW GRAM POSITIVE COCCI IN PAIRS RARE GRAM NEGATIVE RODS    Culture (A)  Final    STREPTOCOCCUS AGALACTIAE STREPTOCOCCUS ANGINOSIS TESTING AGAINST S. AGALACTIAE NOT ROUTINELY PERFORMED DUE TO PREDICTABILITY OF AMP/PEN/VAN SUSCEPTIBILITY. Performed at [facility], [place] [address], [place], [place] [address]    Report Status 02/21/2020 FINAL  Final   Organism ID, Bacteria STREPTOCOCCUS AGALACTIAE  Final      Susceptibility   Streptococcus agalactiae - MIC*    CLINDAMYCIN <=0.25 SENSITIVE Sensitive     AMPICILLIN <=0.25 SENSITIVE Sensitive     ERYTHROMYCIN <=0.12 SENSITIVE Sensitive     VANCOMYCIN 0.5 SENSITIVE Sensitive     CEFTRIAXONE 0.5 SENSITIVE Sensitive     LEVOFLOXACIN 0.5 SENSITIVE Sensitive     * STREPTOCOCCUS AGALACTIAE  MRSA PCR Screening     Status: None   Collection Time: 02/18/20  8:01 PM   Specimen: Nasopharyngeal  Result Value Ref Range Status   MRSA by PCR NEGATIVE NEGATIVE Final    Comment:        The GeneXpert MRSA Assay (FDA approved for NASAL specimens only), is one component of a comprehensive MRSA colonization surveillance program. It is not intended to diagnose MRSA infection nor to guide or monitor treatment for MRSA infections. Performed at [facility], [place] [address], [place], [place] [address]   Aerobic/Anaerobic Culture (surgical/deep wound)     Status: None (Preliminary result)    Collection Time: 02/19/20  1:52 PM   Specimen: Abscess  Result Value Ref Range Status   Specimen Description ABSCESS RIGHT TOE GREAT  Final   Special Requests NONE  Final   Gram Stain   Final    RARE WBC PRESENT, PREDOMINANTLY PMN MODERATE GRAM NEGATIVE RODS FEW GRAM POSITIVE COCCI Performed at [facility], [address], [place], [place] [address]    Culture   Final    MODERATE STREPTOCOCCUS AGALACTIAE TESTING AGAINST S. AGALACTIAE NOT ROUTINELY PERFORMED DUE TO PREDICTABILITY OF AMP/PEN/VAN SUSCEPTIBILITY. NO ANAEROBES ISOLATED; CULTURE IN PROGRESS FOR 5 DAYS    Report Status PENDING  Incomplete  Aerobic/Anaerobic Culture (surgical/deep wound)     Status: None (Preliminary result)   Collection Time: 02/20/20 12:03 PM   Specimen: Other Source; Tissue  Result Value Ref Range Status   Specimen Description TISSUE RIGHT TOE  Final   Special Requests PT ON FLAGYL, ROCEPHIN,VANCOMYCIN  Final   Gram Stain   Final    RARE WBC PRESENT,BOTH PMN AND MONONUCLEAR RARE GRAM NEGATIVE RODS RARE GRAM POSITIVE COCCI IN PAIRS Performed at [facility], [place] [address] [place], [place] [address]    Culture PENDING  Incomplete   Report Status PENDING  Incomplete     Time coordinating discharge: 45 minutes minutes  SIGNED:   Barb Merino, MD  [facility] 02/21/2020, 11:12 AM

## 2020-02-21 NOTE — Final Progress Note (Addendum)
PIV removed. AVS discussed with patient and husband, all questions and concerns answered. Discharging on two antibiotics - thorough education provided on necessity to take as prescribed and to not quit taking medication early even if feeling better. Both verbalized understanding. Discharged home-self care. At time of discharge, patient and husband verbalized having all belongings. Protective boot for the R foot given to patient, education provided. Transported by wheelchair to husband's car in stable condition.

## 2020-02-21 NOTE — Progress Notes (Signed)
Orthopedic Tech Progress Note Patient Details:  Jasmine Richardson 1961-08-08 [id number]  Ortho Devices Type of Ortho Device: Darco shoe Ortho Device/Splint Interventions: Application   Post Interventions Patient Tolerated: Well Instructions Provided: Care of device   [place] Pink 02/21/2020, 11:50 AM

## 2020-02-22 ENCOUNTER — Telehealth: Payer: Self-pay | Admitting: Podiatry

## 2020-02-22 NOTE — Telephone Encounter (Signed)
Left voicemail for patient to call and schedule post op appt for Friday 02/26/20 per message from Dr.Patel

## 2020-02-23 LAB — SURGICAL PATHOLOGY

## 2020-02-25 LAB — AEROBIC/ANAEROBIC CULTURE W GRAM STAIN (SURGICAL/DEEP WOUND)

## 2020-02-26 ENCOUNTER — Other Ambulatory Visit: Payer: Self-pay

## 2020-02-26 ENCOUNTER — Ambulatory Visit (INDEPENDENT_AMBULATORY_CARE_PROVIDER_SITE_OTHER): Payer: Commercial Managed Care - PPO | Admitting: Sports Medicine

## 2020-02-26 ENCOUNTER — Encounter: Payer: Self-pay | Admitting: Sports Medicine

## 2020-02-26 DIAGNOSIS — E1165 Type 2 diabetes mellitus with hyperglycemia: Secondary | ICD-10-CM

## 2020-02-26 DIAGNOSIS — Z89419 Acquired absence of unspecified great toe: Secondary | ICD-10-CM

## 2020-02-26 DIAGNOSIS — Z9889 Other specified postprocedural states: Secondary | ICD-10-CM

## 2020-02-26 LAB — AEROBIC/ANAEROBIC CULTURE W GRAM STAIN (SURGICAL/DEEP WOUND)

## 2020-02-26 NOTE — Progress Notes (Signed)
Subjective: Jasmine Richardson is a 59 y.o. female patient seen today in office for POV #1 (DOS ), S/P right hallux amputation performed on 02/20/2020 with Dr. Posey Pronto. Patient denies pain at surgical site, denies calf pain, denies headache, chest pain, shortness of breath, nausea, vomiting, fever, or chills. Patient states that she has been discharged from the hospital and is here for follow-up patient reports that she is doing fine has been taking Tylenol for pain and taking her antibiotics as instructed. No other issues noted.   Fasting blood sugar 248 A1c 9 PCP Dr. Sandrea Hughs 2 months ago  Patient Active Problem List   Diagnosis Date Noted  . Diabetic foot ulcer ([place]) 02/16/2020  . Angina pectoris ([place]) 09/11/2017  . Emphysema lung ([place]) 09/11/2017  . Chronic diastolic heart failure ([place]) 11/06/2016  . Mediastinitis 06/28/2016  . Wound, surgical, infected 06/06/2016  . S/P CABG (coronary artery bypass graft) 06/03/2016  . GERD (gastroesophageal reflux disease) 05/08/2016  . Morbid obesity ([place]) 05/08/2016  . Uncontrolled type 2 diabetes mellitus ([place]) 05/08/2016  . Tobacco use disorder 04/20/2016  . Coronary artery disease involving native coronary artery of native heart with angina pectoris ([place]) 09/12/2015  . Essential hypertension 09/12/2015  . Hyperlipidemia 09/12/2015    Current Outpatient Medications on File Prior to Visit  Medication Sig Dispense Refill  . ciprofloxacin (CIPRO) 500 MG tablet Take 1 tablet (500 mg total) by mouth 2 (two) times daily for 14 days. 28 tablet 0  . clindamycin (CLEOCIN) 300 MG capsule Take 1 capsule (300 mg total) by mouth 3 (three) times daily for 14 days. 42 capsule 0  . clopidogrel (PLAVIX) 75 MG tablet Take 75 mg by mouth daily.    . Cyanocobalamin (VITAMIN B 12 PO) Take 1 tablet by mouth daily.    . furosemide (LASIX) 40 MG tablet Take 1 tablet by mouth once daily (Patient taking differently: Take 40 mg by mouth daily. ) 90 tablet 2  . glipiZIDE  (GLUCOTROL) 10 MG tablet Take 10 mg by mouth 2 (two) times daily.    . Insulin Glargine (BASAGLAR KWIKPEN) 100 UNIT/ML Inject 0.2 mLs (20 Units total) into the skin at bedtime. 15 mL 0  . JANUVIA 100 MG tablet Take 100 mg by mouth daily.    [name] LORazepam (ATIVAN) 1 MG tablet Take 1 mg by mouth as needed for anxiety.    . metFORMIN (GLUCOPHAGE-XR) 500 MG 24 hr tablet Take 1,000 mg by mouth 2 (two) times daily.    . metoprolol succinate (TOPROL-XL) 50 MG 24 hr tablet Take 150 mg by mouth daily.     [name] neomycin-polymyxin-dexameth (MAXITROL) 0.1 % OINT Place 1 application into the left eye as needed.    . nitroGLYCERIN (NITROSTAT) 0.4 MG SL tablet Place 1 tablet (0.4 mg total) under the tongue every 5 (five) minutes x 3 doses as needed for chest pain. 30 tablet 11  . omeprazole (PRILOSEC) 20 MG capsule Take 1 capsule (20 mg total) by mouth daily. 90 capsule 1  . pravastatin (PRAVACHOL) 20 MG tablet TAKE 1 TABLET BY MOUTH ONCE DAILY IN THE EVENING (Patient taking differently: Take 20 mg by mouth every evening. ) 90 tablet 2  . ramipril (ALTACE) 1.25 MG capsule Take 1.25 mg by mouth 2 (two) times daily.     . vitamin C (ASCORBIC ACID) 250 MG tablet Take 250 mg by mouth daily.    . Vitamin D, Ergocalciferol, (DRISDOL) 50000 units CAPS capsule Take 50,000 Units by mouth every 7 (seven)  days. Saturdays     No current facility-administered medications on file prior to visit.    Allergies  Allergen Reactions  . Tramadol   . Codeine Rash    Objective: There were no vitals filed for this visit.  General: No acute distress, AAOx3  Right foot: Sutures intact with no gapping or dehiscence at surgical site, there is mild maceration and granular tissue noted at the incision line, mild swelling to the amputation stump site, blanchable erythema, no warmth, no other signs of infection noted, Capillary fill time <5 seconds in all lesser digits, protective sensation severely diminished.  No pain or crepitation with  range of motion right foot.  No pain with calf compression.     Assessment and Plan:  Problem List Items Addressed This Visit      Endocrine   Uncontrolled type 2 diabetes mellitus ([place])    Other Visit Diagnoses    S/P foot surgery, right    -  Primary   History of amputation of hallux ([place])           -Patient seen and evaluated -Applied Betadine and dry sterile dressing to surgical site right foot secured with ACE wrap and stockinet  -Advised patient to make sure to keep dressings clean, dry, and intact to right foot surgical site  -Advised patient to limit activity to necessity and to continue with postoperative shoe and cane -Advised patient to ice and elevate as necessary  -Continue with Cipro and clindamycin antibiotic and Tylenol as needed for pain -Will plan for at least 1 postop check with Dr. Posey Pronto who did her original surgery and x-rays and then afterwards she can follow-up with me in the [facility] office for continued postoperative care.  Landis Martins, DPM

## 2020-02-29 ENCOUNTER — Telehealth: Payer: Self-pay | Admitting: *Deleted

## 2020-02-29 NOTE — Telephone Encounter (Signed)
Patient is requesting sooner appointment if available. Please return call.

## 2020-03-02 ENCOUNTER — Ambulatory Visit (INDEPENDENT_AMBULATORY_CARE_PROVIDER_SITE_OTHER): Payer: Commercial Managed Care - PPO | Admitting: Podiatry

## 2020-03-02 ENCOUNTER — Other Ambulatory Visit: Payer: Self-pay

## 2020-03-02 ENCOUNTER — Ambulatory Visit (INDEPENDENT_AMBULATORY_CARE_PROVIDER_SITE_OTHER): Payer: Commercial Managed Care - PPO

## 2020-03-02 DIAGNOSIS — Z89419 Acquired absence of unspecified great toe: Secondary | ICD-10-CM | POA: Diagnosis not present

## 2020-03-02 DIAGNOSIS — E1165 Type 2 diabetes mellitus with hyperglycemia: Secondary | ICD-10-CM

## 2020-03-03 ENCOUNTER — Encounter: Payer: Self-pay | Admitting: Podiatry

## 2020-03-03 NOTE — Progress Notes (Signed)
Subjective:  [name], female    DOB: December 29, 1960,  MRN: [id number]  Chief Complaint  Patient presents with  . Routine Post Op    POV #1 DOS 02/20/2020 AMPUTATION RIGHT GREAT  TOE    59 y.o. female returns for post-op check.  Patient presents with a follow-up of amputation of the right great toe.  Overall she is doing well.  No clinical signs of infection.  Patient has been keeping well bandaged.  The pain is minimal.  She has been wearing her surgical shoe.  I discussed with her that she may benefit from diabetic shoes in the future.  I discussed with her that sugar control is very important as her A1c is elevated.  She will continue to see Dr. Cannon Kettle at the [facility] office for postop management for now.  Review of Systems: Negative except as noted in the HPI. Denies N/V/F/Ch.  Past Medical History:  Diagnosis Date  . Acute chest pain 05/08/2016  . Angina pectoris ([place]) 09/11/2017  . [place] 05/08/2016  . Chronic diastolic heart failure ([place]) 11/06/2016  . Coronary artery disease involving native coronary artery of native heart with angina pectoris ([place]) 09/12/2015   Overview:  PCI and stent of RCA 2009, last cath 2012 with medical therapy  CABG May 2017  . Emphysema lung ([place]) 09/11/2017  . Essential hypertension 09/12/2015  . GERD (gastroesophageal reflux disease) 05/08/2016  . Hyperlipidemia 09/12/2015  . Mediastinitis 06/28/2016  . Morbid obesity ([place]) 05/08/2016  . S/P CABG (coronary artery bypass graft) 06/03/2016   Overview:  The patient underwent sternal reconstruction on 06/21/16 with pec flaps for mediastinitis from a prior CABG in May 2017. On admission, she was critically ill from sepsis and had altered mental status. She was last seen in clinic on 08/09/16 at which time she was doing well.  . Severe sepsis ([place]) 06/03/2016  . Sinus tachycardia 05/08/2016  . Tobacco use disorder 04/20/2016   Overview:  Quit in May 2017.  [name] Uncontrolled type 2 diabetes mellitus ([place])  05/08/2016  . Wound, surgical, infected 06/06/2016   Overview:  sternal    Current Outpatient Medications:  .  ciprofloxacin (CIPRO) 500 MG tablet, Take 1 tablet (500 mg total) by mouth 2 (two) times daily for 14 days., Disp: 28 tablet, Rfl: 0 .  clindamycin (CLEOCIN) 300 MG capsule, Take 1 capsule (300 mg total) by mouth 3 (three) times daily for 14 days., Disp: 42 capsule, Rfl: 0 .  clopidogrel (PLAVIX) 75 MG tablet, Take 75 mg by mouth daily., Disp: , Rfl:  .  Cyanocobalamin (VITAMIN B 12 PO), Take 1 tablet by mouth daily., Disp: , Rfl:  .  furosemide (LASIX) 40 MG tablet, Take 1 tablet by mouth once daily (Patient taking differently: Take 40 mg by mouth daily. ), Disp: 90 tablet, Rfl: 2 .  glipiZIDE (GLUCOTROL) 10 MG tablet, Take 10 mg by mouth 2 (two) times daily., Disp: , Rfl:  .  Insulin Glargine (BASAGLAR KWIKPEN) 100 UNIT/ML, Inject 0.2 mLs (20 Units total) into the skin at bedtime., Disp: 15 mL, Rfl: 0 .  JANUVIA 100 MG tablet, Take 100 mg by mouth daily., Disp: , Rfl:  .  LORazepam (ATIVAN) 1 MG tablet, Take 1 mg by mouth as needed for anxiety., Disp: , Rfl:  .  metFORMIN (GLUCOPHAGE-XR) 500 MG 24 hr tablet, Take 1,000 mg by mouth 2 (two) times daily., Disp: , Rfl:  .  metoprolol succinate (TOPROL-XL) 50 MG 24 hr tablet, Take 150 mg  by mouth daily. , Disp: , Rfl:  .  neomycin-polymyxin-dexameth (MAXITROL) 0.1 % OINT, Place 1 application into the left eye as needed., Disp: , Rfl:  .  nitroGLYCERIN (NITROSTAT) 0.4 MG SL tablet, Place 1 tablet (0.4 mg total) under the tongue every 5 (five) minutes x 3 doses as needed for chest pain., Disp: 30 tablet, Rfl: 11 .  omeprazole (PRILOSEC) 20 MG capsule, Take 1 capsule (20 mg total) by mouth daily., Disp: 90 capsule, Rfl: 1 .  pravastatin (PRAVACHOL) 20 MG tablet, TAKE 1 TABLET BY MOUTH ONCE DAILY IN THE EVENING (Patient taking differently: Take 20 mg by mouth every evening. ), Disp: 90 tablet, Rfl: 2 .  ramipril (ALTACE) 1.25 MG capsule, Take  1.25 mg by mouth 2 (two) times daily. , Disp: , Rfl:  .  vitamin C (ASCORBIC ACID) 250 MG tablet, Take 250 mg by mouth daily., Disp: , Rfl:  .  Vitamin D, Ergocalciferol, (DRISDOL) 50000 units CAPS capsule, Take 50,000 Units by mouth every 7 (seven) days. Saturdays, Disp: , Rfl:   Social History   Tobacco Use  Smoking Status Former Smoker  Smokeless Tobacco Never Used    Allergies  Allergen Reactions  . Tramadol   . Codeine Rash   Objective:  There were no vitals filed for this visit. There is no height or weight on file to calculate BMI. Constitutional Well developed. Well nourished.  Vascular Foot warm and well perfused. Capillary refill normal to all digits.   Neurologic Normal speech. Oriented to person, place, and time. Epicritic sensation to light touch grossly present bilaterally.  Dermatologic Skin healing well without signs of infection. Skin edges well coapted without signs of infection.  No clinical signs of infection noted  Orthopedic: Tenderness to palpation noted about the surgical site.   Radiographs: 3 views of skeletally mature adult foot right: Amputation at the level of the metatarsophalangeal joint of the first noted.  No soft tissue emphysema noted.  No foreign body noted.  No signs of cortical irregularity of the first metatarsal head noted. Assessment:   1. History of amputation of hallux ([place])   2. Uncontrolled type 2 diabetes mellitus with hyperglycemia ([place])    Plan:  Patient was evaluated and treated and all questions answered.  S/p foot surgery right -Progressing as expected post-operatively. -XR: See above -WB Status: Weightbearing as tolerated in surgical shoe -Sutures: I will leave them in for now.  I am worried that patient may superficially dehisced with time.  She may need local wound care in the future.  However for now we will plan on taking the stitches out during next visit. -Medications: None -Patient will continue to Betadine  wet-to-dry dressing changes a 3 times week.  There is some maceration present around the incision site. -Due to traveling restrictions I believe patient will benefit from follow-up care with Dr. Cannon Kettle or Dr. March Rummage at the [facility] office.  -I also discussed with her the importance of diabetic shoes with insoles.  I believe patient will benefit from getting diabetic shoes once incision site is healed as this will make the transition with the filler much easier.  I have asked the patient to go see their primary care physician right away followed by [facility] for casting of the orthotics for diabetic shoes.  Patient states understanding  Return for f/u with stover in [place] office.

## 2020-03-09 ENCOUNTER — Encounter: Payer: Self-pay | Admitting: Sports Medicine

## 2020-03-09 ENCOUNTER — Ambulatory Visit (INDEPENDENT_AMBULATORY_CARE_PROVIDER_SITE_OTHER): Payer: Commercial Managed Care - PPO | Admitting: Sports Medicine

## 2020-03-09 ENCOUNTER — Other Ambulatory Visit: Payer: Self-pay

## 2020-03-09 DIAGNOSIS — T8131XD Disruption of external operation (surgical) wound, not elsewhere classified, subsequent encounter: Secondary | ICD-10-CM

## 2020-03-09 DIAGNOSIS — E1165 Type 2 diabetes mellitus with hyperglycemia: Secondary | ICD-10-CM

## 2020-03-09 DIAGNOSIS — Z89419 Acquired absence of unspecified great toe: Secondary | ICD-10-CM

## 2020-03-09 DIAGNOSIS — Z9889 Other specified postprocedural states: Secondary | ICD-10-CM

## 2020-03-09 NOTE — Progress Notes (Signed)
Subjective: Jasmine Richardson is a 59 y.o. female patient seen today in office for POV #3(DOS ), S/P right hallux amputation performed on 02/20/2020 with Dr. Posey Pronto. Patient denies pain at surgical site, reports that she feels like she is doing pretty good every once and a well has a small amount of nerve pain, denies calf pain, denies headache, chest pain, shortness of breath, nausea, vomiting, fever, or chills.  Taking ciprofloxacin without any issues.    Fasting blood sugar 200 and last A1c unknown  Patient Active Problem List   Diagnosis Date Noted  . Diabetic foot ulcer ([place]) 02/16/2020  . Angina pectoris ([place]) 09/11/2017  . Emphysema lung ([place]) 09/11/2017  . Chronic diastolic heart failure ([place]) 11/06/2016  . Mediastinitis 06/28/2016  . Wound, surgical, infected 06/06/2016  . S/P CABG (coronary artery bypass graft) 06/03/2016  . GERD (gastroesophageal reflux disease) 05/08/2016  . Morbid obesity ([place]) 05/08/2016  . Uncontrolled type 2 diabetes mellitus ([place]) 05/08/2016  . Tobacco use disorder 04/20/2016  . Coronary artery disease involving native coronary artery of native heart with angina pectoris ([place]) 09/12/2015  . Essential hypertension 09/12/2015  . Hyperlipidemia 09/12/2015    Current Outpatient Medications on File Prior to Visit  Medication Sig Dispense Refill  . clopidogrel (PLAVIX) 75 MG tablet Take 75 mg by mouth daily.    . Cyanocobalamin (VITAMIN B 12 PO) Take 1 tablet by mouth daily.    . furosemide (LASIX) 40 MG tablet Take 1 tablet by mouth once daily (Patient taking differently: Take 40 mg by mouth daily. ) 90 tablet 2  . glipiZIDE (GLUCOTROL) 10 MG tablet Take 10 mg by mouth 2 (two) times daily.    . Insulin Glargine (BASAGLAR KWIKPEN) 100 UNIT/ML Inject 0.2 mLs (20 Units total) into the skin at bedtime. 15 mL 0  . JANUVIA 100 MG tablet Take 100 mg by mouth daily.    [name] LORazepam (ATIVAN) 1 MG tablet Take 1 mg by mouth as needed for anxiety.    . metFORMIN  (GLUCOPHAGE-XR) 500 MG 24 hr tablet Take 1,000 mg by mouth 2 (two) times daily.    . metoprolol succinate (TOPROL-XL) 50 MG 24 hr tablet Take 150 mg by mouth daily.     [name] neomycin-polymyxin-dexameth (MAXITROL) 0.1 % OINT Place 1 application into the left eye as needed.    . nitroGLYCERIN (NITROSTAT) 0.4 MG SL tablet Place 1 tablet (0.4 mg total) under the tongue every 5 (five) minutes x 3 doses as needed for chest pain. 30 tablet 11  . omeprazole (PRILOSEC) 20 MG capsule Take 1 capsule (20 mg total) by mouth daily. 90 capsule 1  . pravastatin (PRAVACHOL) 20 MG tablet TAKE 1 TABLET BY MOUTH ONCE DAILY IN THE EVENING (Patient taking differently: Take 20 mg by mouth every evening. ) 90 tablet 2  . ramipril (ALTACE) 1.25 MG capsule Take 1.25 mg by mouth 2 (two) times daily.     . vitamin C (ASCORBIC ACID) 250 MG tablet Take 250 mg by mouth daily.    . Vitamin D, Ergocalciferol, (DRISDOL) 50000 units CAPS capsule Take 50,000 Units by mouth every 7 (seven) days. Saturdays     No current facility-administered medications on file prior to visit.    Allergies  Allergen Reactions  . Tramadol   . Codeine Rash    Objective: There were no vitals filed for this visit.  General: No acute distress, AAOx3  Right foot: Sutures intact with mild central dehiscence at surgical site measures 1 cm x  0.3 cm with a fibrogranular base, there is no maceration, mild swelling to the amputation stump site, blanchable erythema, no warmth, no other signs of infection noted, Capillary fill time <5 seconds in all lesser digits, protective sensation severely diminished.  No pain or crepitation with range of motion right foot.  No pain with calf compression.     Assessment and Plan:  Problem List Items Addressed This Visit      Endocrine   Uncontrolled type 2 diabetes mellitus ([place])    Other Visit Diagnoses    History of amputation of hallux ([place])    -  Primary   S/P foot surgery, right       Postoperative wound  dehiscence, subsequent encounter           -Patient seen and evaluated -A few sutures were removed -Applied Betadine and dry sterile dressing to surgical site right foot secured with ACE wrap and stockinet  -Advised patient to continue with doing the same thing every other day -Advised patient to limit activity to necessity and to continue with postoperative shoe and cane like before -Advised patient to ice and elevate as necessary  -Continue with Cipro antibiotic until completed and Tylenol as needed for pain -Will plan for wound check at next office visit. Landis Martins, DPM

## 2020-03-11 ENCOUNTER — Other Ambulatory Visit: Payer: Self-pay | Admitting: Cardiology

## 2020-03-11 MED ORDER — CLOPIDOGREL BISULFATE 75 MG PO TABS: 75.0000 mg | ORAL_TABLET | Freq: Every day | ORAL | 10 refills | Status: DC

## 2020-03-11 NOTE — Telephone Encounter (Signed)
*  STAT* If patient is at the pharmacy, call can be transferred to refill team.   1. Which medications need to be refilled? (please list name of each medication and dose if known) clopidogrel (PLAVIX) 75 MG tablet  2. Which pharmacy/location (including street and city if local pharmacy) is medication to be sent to? [place], [place]  3. Do they need a 30 day or 90 day supply? 30 day  Patient is out of medication.

## 2020-03-17 ENCOUNTER — Encounter: Payer: Self-pay | Admitting: Sports Medicine

## 2020-03-17 ENCOUNTER — Encounter: Payer: Commercial Managed Care - PPO | Admitting: Sports Medicine

## 2020-03-17 ENCOUNTER — Other Ambulatory Visit: Payer: Self-pay

## 2020-03-17 ENCOUNTER — Ambulatory Visit (INDEPENDENT_AMBULATORY_CARE_PROVIDER_SITE_OTHER): Payer: Commercial Managed Care - PPO | Admitting: Sports Medicine

## 2020-03-17 DIAGNOSIS — Z9889 Other specified postprocedural states: Secondary | ICD-10-CM

## 2020-03-17 DIAGNOSIS — E1165 Type 2 diabetes mellitus with hyperglycemia: Secondary | ICD-10-CM

## 2020-03-17 DIAGNOSIS — T8131XD Disruption of external operation (surgical) wound, not elsewhere classified, subsequent encounter: Secondary | ICD-10-CM

## 2020-03-17 DIAGNOSIS — Z89419 Acquired absence of unspecified great toe: Secondary | ICD-10-CM

## 2020-03-17 NOTE — Progress Notes (Signed)
Subjective: Jasmine Richardson is a 59 y.o. female patient seen today in office for POV # 4 (DOS ), S/P right hallux amputation performed on 02/20/2020 with Dr. Posey Pronto. Patient denies pain at surgical site, reports that she feels like she is doing pretty good every once and a well has a small amount of pain due to tightness and thinks that may be related to her dressings or the surgical shoe, patient denies calf pain, denies headache, chest pain, shortness of breath, nausea, vomiting, fever, or chills.  Reports that she has finished her ciprofloxacin without any issues.  Fasting blood sugar 247 and last A1c unknown  Patient Active Problem List   Diagnosis Date Noted  . Diabetic foot ulcer ([place]) 02/16/2020  . Angina pectoris ([place]) 09/11/2017  . Emphysema lung ([place]) 09/11/2017  . Chronic diastolic heart failure ([place]) 11/06/2016  . Mediastinitis 06/28/2016  . Wound, surgical, infected 06/06/2016  . S/P CABG (coronary artery bypass graft) 06/03/2016  . GERD (gastroesophageal reflux disease) 05/08/2016  . Morbid obesity ([place]) 05/08/2016  . Uncontrolled type 2 diabetes mellitus ([place]) 05/08/2016  . Tobacco use disorder 04/20/2016  . Coronary artery disease involving native coronary artery of native heart with angina pectoris ([place]) 09/12/2015  . Essential hypertension 09/12/2015  . Hyperlipidemia 09/12/2015    Current Outpatient Medications on File Prior to Visit  Medication Sig Dispense Refill  . insulin regular (NOVOLIN R) 100 units/mL injection Inject into the skin.    [name] aspirin 81 MG chewable tablet Chew by mouth.    . clopidogrel (PLAVIX) 75 MG tablet Take 1 tablet (75 mg total) by mouth daily. 30 tablet 10  . Cyanocobalamin (VITAMIN B 12 PO) Take 1 tablet by mouth daily.    . furosemide (LASIX) 40 MG tablet Take 1 tablet by mouth once daily (Patient taking differently: Take 40 mg by mouth daily. ) 90 tablet 2  . glipiZIDE (GLUCOTROL) 10 MG tablet Take 10 mg by mouth 2 (two) times daily.    .  Insulin Glargine (BASAGLAR KWIKPEN) 100 UNIT/ML Inject 0.2 mLs (20 Units total) into the skin at bedtime. 15 mL 0  . JANUVIA 100 MG tablet Take 100 mg by mouth daily.    [name] LORazepam (ATIVAN) 1 MG tablet Take 1 mg by mouth as needed for anxiety.    . metFORMIN (GLUCOPHAGE-XR) 500 MG 24 hr tablet Take 1,000 mg by mouth 2 (two) times daily.    . metoprolol succinate (TOPROL-XL) 50 MG 24 hr tablet Take 150 mg by mouth daily.     [name] neomycin-polymyxin-dexameth (MAXITROL) 0.1 % OINT Place 1 application into the left eye as needed.    . nitroGLYCERIN (NITROSTAT) 0.4 MG SL tablet Place 1 tablet (0.4 mg total) under the tongue every 5 (five) minutes x 3 doses as needed for chest pain. 30 tablet 11  . omeprazole (PRILOSEC) 20 MG capsule Take 1 capsule (20 mg total) by mouth daily. 90 capsule 1  . pravastatin (PRAVACHOL) 20 MG tablet TAKE 1 TABLET BY MOUTH ONCE DAILY IN THE EVENING (Patient taking differently: Take 20 mg by mouth every evening. ) 90 tablet 2  . ramipril (ALTACE) 1.25 MG capsule Take 1.25 mg by mouth 2 (two) times daily.     . vitamin C (ASCORBIC ACID) 250 MG tablet Take 250 mg by mouth daily.    . Vitamin D, Ergocalciferol, (DRISDOL) 50000 units CAPS capsule Take 50,000 Units by mouth every 7 (seven) days. Saturdays     No current facility-administered medications on file  prior to visit.    Allergies  Allergen Reactions  . Tramadol   . Codeine Rash    Objective: There were no vitals filed for this visit.  General: No acute distress, AAOx3  Right foot: Sutures intact with mild central dehiscence at surgical site measures 2cm x 0.5 cm post debridement with a fibrogranular base, there is no maceration, mild swelling to the amputation stump site, blanchable erythema, no warmth, no other signs of infection noted, Capillary fill time <5 seconds in all lesser digits, protective sensation severely diminished.  No pain or crepitation with range of motion right foot.  No pain with calf  compression.     Assessment and Plan:  Problem List Items Addressed This Visit      Endocrine   Uncontrolled type 2 diabetes mellitus ([place])   Relevant Medications   aspirin 81 MG chewable tablet   insulin regular (NOVOLIN R) 100 units/mL injection    Other Visit Diagnoses    History of amputation of hallux (HCC)    -  Primary   S/P foot surgery, right       Postoperative wound dehiscence, subsequent encounter           -Patient seen and evaluated -All remaining sutures removed -Applied Steri-Strips -Applied Betadine and dry sterile dressing to surgical site right foot secured with ACE wrap and stockinet  -Advised patient to continue with doing the same thing every other day and also gave Steri-Strips for her to use in case they start to lift to reapply to help to keep skin edges get together to help with healing at this amputation wound site -Advised patient to limit activity to necessity and to continue with postoperative shoe and cane like before -Advised patient to continue with rest and elevation as necessary to avoid tight feeling of swelling in surgical foot -Continue with Tylenol as needed for pain -Will plan for wound check at next office visit. Landis Martins, DPM

## 2020-03-24 ENCOUNTER — Telehealth: Payer: Self-pay

## 2020-03-24 MED ORDER — RAMIPRIL 1.25 MG PO CAPS: 1.2500 mg | ORAL_CAPSULE | Freq: Two times a day (BID) | ORAL | 3 refills | Status: DC

## 2020-03-24 NOTE — Telephone Encounter (Signed)
Refill request sent by Wanda on behalf of the patient.   Refill was sent in to the pharmacy at this time.

## 2020-04-01 ENCOUNTER — Encounter: Payer: Self-pay | Admitting: Sports Medicine

## 2020-04-01 ENCOUNTER — Other Ambulatory Visit: Payer: Self-pay

## 2020-04-01 ENCOUNTER — Ambulatory Visit: Payer: Commercial Managed Care - PPO | Admitting: Sports Medicine

## 2020-04-01 DIAGNOSIS — E1165 Type 2 diabetes mellitus with hyperglycemia: Secondary | ICD-10-CM

## 2020-04-01 DIAGNOSIS — Z89419 Acquired absence of unspecified great toe: Secondary | ICD-10-CM

## 2020-04-01 DIAGNOSIS — T8131XD Disruption of external operation (surgical) wound, not elsewhere classified, subsequent encounter: Secondary | ICD-10-CM

## 2020-04-01 DIAGNOSIS — Z9889 Other specified postprocedural states: Secondary | ICD-10-CM

## 2020-04-01 NOTE — Progress Notes (Signed)
Subjective: Jasmine Richardson is a 59 y.o. female patient seen today in office for POV # 5 (DOS ), S/P right hallux amputation performed on 02/20/2020 with Dr. Posey Pronto. Patient denies pain at surgical site except a new little pain at the lateral aspect of the foot feels like her surgical shoe is rubbing, reports a little bit of drainage and son has been helping her with applying Betadine and Steri-Strips to the area, patient denies calf pain, denies headache, chest pain, shortness of breath, nausea, vomiting, fever, or chills.  Reports that she has a follow-up appointment with her PCP for update on blood work and they will closely watch the white blood cell count she has recently completed Cipro antibiotics and currently is not on any additional antibiotic at this time.  Fasting blood sugar 254 and last A1c unknown  Patient Active Problem List   Diagnosis Date Noted  . Diabetic foot ulcer ([place]) 02/16/2020  . Angina pectoris ([place]) 09/11/2017  . Emphysema lung ([place]) 09/11/2017  . Chronic diastolic heart failure ([place]) 11/06/2016  . Mediastinitis 06/28/2016  . Wound, surgical, infected 06/06/2016  . S/P CABG (coronary artery bypass graft) 06/03/2016  . GERD (gastroesophageal reflux disease) 05/08/2016  . Morbid obesity ([place]) 05/08/2016  . Uncontrolled type 2 diabetes mellitus ([place]) 05/08/2016  . Tobacco use disorder 04/20/2016  . Coronary artery disease involving native coronary artery of native heart with angina pectoris ([place]) 09/12/2015  . Essential hypertension 09/12/2015  . Hyperlipidemia 09/12/2015    Current Outpatient Medications on File Prior to Visit  Medication Sig Dispense Refill  . aspirin 81 MG chewable tablet Chew by mouth.    . clopidogrel (PLAVIX) 75 MG tablet Take 1 tablet (75 mg total) by mouth daily. 30 tablet 10  . Cyanocobalamin (VITAMIN B 12 PO) Take 1 tablet by mouth daily.    . furosemide (LASIX) 40 MG tablet Take 1 tablet by mouth once daily (Patient taking differently: Take  40 mg by mouth daily. ) 90 tablet 2  . glipiZIDE (GLUCOTROL) 10 MG tablet Take 10 mg by mouth 2 (two) times daily.    . Insulin Glargine (BASAGLAR KWIKPEN) 100 UNIT/ML Inject 0.2 mLs (20 Units total) into the skin at bedtime. 15 mL 0  . insulin regular (NOVOLIN R) 100 units/mL injection Inject into the skin.    [name] JANUVIA 100 MG tablet Take 100 mg by mouth daily.    [name] LORazepam (ATIVAN) 1 MG tablet Take 1 mg by mouth as needed for anxiety.    . metFORMIN (GLUCOPHAGE-XR) 500 MG 24 hr tablet Take 1,000 mg by mouth 2 (two) times daily.    . metoprolol succinate (TOPROL-XL) 50 MG 24 hr tablet Take 150 mg by mouth daily.     [name] neomycin-polymyxin-dexameth (MAXITROL) 0.1 % OINT Place 1 application into the left eye as needed.    . nitroGLYCERIN (NITROSTAT) 0.4 MG SL tablet Place 1 tablet (0.4 mg total) under the tongue every 5 (five) minutes x 3 doses as needed for chest pain. 30 tablet 11  . omeprazole (PRILOSEC) 20 MG capsule Take 1 capsule (20 mg total) by mouth daily. 90 capsule 1  . pravastatin (PRAVACHOL) 20 MG tablet TAKE 1 TABLET BY MOUTH ONCE DAILY IN THE EVENING (Patient taking differently: Take 20 mg by mouth every evening. ) 90 tablet 2  . ramipril (ALTACE) 1.25 MG capsule Take 1 capsule (1.25 mg total) by mouth 2 (two) times daily. 60 capsule 3  . vitamin C (ASCORBIC ACID) 250 MG tablet Take  250 mg by mouth daily.    . Vitamin D, Ergocalciferol, (DRISDOL) 50000 units CAPS capsule Take 50,000 Units by mouth every 7 (seven) days. Saturdays     No current facility-administered medications on file prior to visit.    Allergies  Allergen Reactions  . Tramadol   . Codeine Rash    Objective: There were no vitals filed for this visit.  General: No acute distress, AAOx3  Right foot: Sutures intact with mild central dehiscence at surgical site measures 2 x 0.4 x 0.3 cm post debridement with a fibrogranular base and fatty tissue, there is no maceration, mild swelling to the amputation stump  site, blanchable erythema, no warmth, no other signs of infection noted, Capillary fill time <5 seconds in all lesser digits, protective sensation severely diminished.  No pain or crepitation with range of motion right foot.  No pain with calf compression.     Assessment and Plan:  Problem List Items Addressed This Visit      Endocrine   Uncontrolled type 2 diabetes mellitus ([place])    Other Visit Diagnoses    Postoperative wound dehiscence, subsequent encounter    -  Primary   History of amputation of hallux ([place])       S/P foot surgery, right           -Patient seen and evaluated -All remaining sutures removed -Mechanically debrided amputation stump wound/wound dehiscence site using a sterile tissue nipper removing all none viable tissue to healthy bleeding borders with post debridement measurement as above.  Hemostasis was achieved with manual pressure.  Patient tolerated debridement well with need for anesthesia. -Applied Prisma and dry sterile dressing to surgical site right foot secured with ACE wrap and stockinet  -Advised patient to continue with doing the same thing every other day and also gave 2-week supply of Prisma collagen sponge and ordered wound care supplies from [facility] -Advised patient to limit activity to necessity and to continue with postoperative shoe with offloading padding and cane like before advised patient that if she needs to drop at all the drop for short distances less than 30 minutes one way and must remove surgical shoe and wear a soft bedroom slipper and replace surgical shoe when she gets out of the car -Advised patient to continue with rest and elevation as instructed -Continue with Tylenol as needed for pain -Will plan for wound check and x-rays at next office visit. Landis Martins, DPM

## 2020-04-07 ENCOUNTER — Encounter: Payer: Self-pay | Admitting: Sports Medicine

## 2020-04-07 ENCOUNTER — Other Ambulatory Visit: Payer: Self-pay | Admitting: Sports Medicine

## 2020-04-07 ENCOUNTER — Ambulatory Visit (INDEPENDENT_AMBULATORY_CARE_PROVIDER_SITE_OTHER): Payer: Commercial Managed Care - PPO

## 2020-04-07 ENCOUNTER — Ambulatory Visit (INDEPENDENT_AMBULATORY_CARE_PROVIDER_SITE_OTHER): Payer: Commercial Managed Care - PPO | Admitting: Sports Medicine

## 2020-04-07 ENCOUNTER — Other Ambulatory Visit: Payer: Self-pay

## 2020-04-07 DIAGNOSIS — T8131XD Disruption of external operation (surgical) wound, not elsewhere classified, subsequent encounter: Secondary | ICD-10-CM

## 2020-04-07 DIAGNOSIS — Z9889 Other specified postprocedural states: Secondary | ICD-10-CM

## 2020-04-07 DIAGNOSIS — M79671 Pain in right foot: Secondary | ICD-10-CM

## 2020-04-07 DIAGNOSIS — Z89419 Acquired absence of unspecified great toe: Secondary | ICD-10-CM

## 2020-04-07 DIAGNOSIS — E1165 Type 2 diabetes mellitus with hyperglycemia: Secondary | ICD-10-CM

## 2020-04-07 NOTE — Progress Notes (Signed)
Subjective: Jasmine Richardson is a 59 y.o. female patient seen today in office for POV # 6 (DOS ), S/P right hallux amputation performed on 02/20/2020 with Dr. Posey Pronto. Patient denies pain at surgical site, reports drainage and son has been helping her with applying PRISMA to the area, patient denies calf pain, denies headache, chest pain, shortness of breath, nausea, vomiting, fever, or chills.   Fasting blood sugar 274 and last A1c unknown  Patient Active Problem List   Diagnosis Date Noted  . Diabetic foot ulcer ([place]) 02/16/2020  . Angina pectoris ([place]) 09/11/2017  . Emphysema lung ([place]) 09/11/2017  . Chronic diastolic heart failure ([place]) 11/06/2016  . Mediastinitis 06/28/2016  . Wound, surgical, infected 06/06/2016  . S/P CABG (coronary artery bypass graft) 06/03/2016  . GERD (gastroesophageal reflux disease) 05/08/2016  . Morbid obesity ([place]) 05/08/2016  . Uncontrolled type 2 diabetes mellitus ([place]) 05/08/2016  . Tobacco use disorder 04/20/2016  . Coronary artery disease involving native coronary artery of native heart with angina pectoris ([place]) 09/12/2015  . Essential hypertension 09/12/2015  . Hyperlipidemia 09/12/2015    Current Outpatient Medications on File Prior to Visit  Medication Sig Dispense Refill  . aspirin 81 MG chewable tablet Chew by mouth.    . clopidogrel (PLAVIX) 75 MG tablet Take 1 tablet (75 mg total) by mouth daily. 30 tablet 10  . Cyanocobalamin (VITAMIN B 12 PO) Take 1 tablet by mouth daily.    . furosemide (LASIX) 40 MG tablet Take 1 tablet by mouth once daily (Patient taking differently: Take 40 mg by mouth daily. ) 90 tablet 2  . glipiZIDE (GLUCOTROL) 10 MG tablet Take 10 mg by mouth 2 (two) times daily.    . Insulin Glargine (BASAGLAR KWIKPEN) 100 UNIT/ML Inject 0.2 mLs (20 Units total) into the skin at bedtime. 15 mL 0  . insulin regular (NOVOLIN R) 100 units/mL injection Inject into the skin.    [name] JANUVIA 100 MG tablet Take 100 mg by mouth daily.    [name]  LORazepam (ATIVAN) 1 MG tablet Take 1 mg by mouth as needed for anxiety.    . metFORMIN (GLUCOPHAGE-XR) 500 MG 24 hr tablet Take 1,000 mg by mouth 2 (two) times daily.    . metoprolol succinate (TOPROL-XL) 50 MG 24 hr tablet Take 150 mg by mouth daily.     [name] neomycin-polymyxin-dexameth (MAXITROL) 0.1 % OINT Place 1 application into the left eye as needed.    . nitroGLYCERIN (NITROSTAT) 0.4 MG SL tablet Place 1 tablet (0.4 mg total) under the tongue every 5 (five) minutes x 3 doses as needed for chest pain. 30 tablet 11  . omeprazole (PRILOSEC) 20 MG capsule Take 1 capsule (20 mg total) by mouth daily. 90 capsule 1  . pravastatin (PRAVACHOL) 20 MG tablet TAKE 1 TABLET BY MOUTH ONCE DAILY IN THE EVENING (Patient taking differently: Take 20 mg by mouth every evening. ) 90 tablet 2  . ramipril (ALTACE) 1.25 MG capsule Take 1 capsule (1.25 mg total) by mouth 2 (two) times daily. 60 capsule 3  . vitamin C (ASCORBIC ACID) 250 MG tablet Take 250 mg by mouth daily.    . Vitamin D, Ergocalciferol, (DRISDOL) 50000 units CAPS capsule Take 50,000 Units by mouth every 7 (seven) days. Saturdays     No current facility-administered medications on file prior to visit.    Allergies  Allergen Reactions  . Tramadol   . Codeine Rash    Objective: There were no vitals filed for this visit.  General: No acute distress, AAOx3  Right foot: Sutures intact with mild central dehiscence at surgical site measures  3x 1 x 0.5 cm post debridement with fatty tissue and now the met head exposed, there is no maceration, mild swelling to the amputation stump site, blanchable erythema, no warmth, no other signs of infection noted, Capillary fill time <5 seconds in all lesser digits, protective sensation severely diminished.  No pain or crepitation with range of motion right foot.  No pain with calf compression.   Right foot x-ray: Consistent with amputation of first toe metatarsal head appears to be intact with no evidence of  bony erosions or osteomyelitis.  Wound defect present.    Assessment and Plan:  Problem List Items Addressed This Visit      Endocrine   Uncontrolled type 2 diabetes mellitus ([place])    Other Visit Diagnoses    Postoperative wound dehiscence, subsequent encounter    -  Primary   History of amputation of hallux (HCC)       S/P foot surgery, right           -Patient seen and evaluated -Discussed blood work with PCP, WBC normal  -X-rays reviewed -Mechanically debrided amputation stump wound/wound dehiscence site using a sterile tissue nipper removing all none viable tissue to healthy bleeding borders with post debridement measurement as above.  Hemostasis was achieved with manual pressure.  Patient tolerated debridement well with need for anesthesia for this part of the procedure however once the metatarsal head was found to be exposed in the wound base it was then decided that it would be best to suture this area. Using 1:1 mixture of 1% lidocaine plain and 0.5% marcaine the amputation stump site was anestheized.  After local anesthesia was achieved the area was then prepped with Betadine. Then using 4-0 Suture the area was sutured closed and dressed with prisma and dry dressing. Patient tolerated the closure procedure well without complication. -Advised patient's son to continue with changing dressing every other day using PRISMA. -Advised patient to limit activity to necessity and to continue with postoperative shoe with offloading padding and cane like before  -Advised patient to continue with rest and elevation as instructed -Continue with Tylenol as needed for pain -Will plan for wound check at next office visit. Landis Martins, DPM

## 2020-04-11 ENCOUNTER — Telehealth: Payer: Self-pay | Admitting: Urology

## 2020-04-11 NOTE — Telephone Encounter (Signed)
Pt called stiches that were done on Friday has popped open and the wound is open, pt says she put butterfly's on it to help close up the wound, pt has an appt on 4/30 do you want pt to come in sooner to see you or price? Please Advise

## 2020-04-11 NOTE — Telephone Encounter (Signed)
Have her to change the dressing daily with help of her son and apply betadine and the steristips/butterfly strips to keep the edges together as best as she can. Do not apply any more Prisma/collagen sponge to the area. She can keep her appt on 4/30. She does not need to come in sooner if there are no signs of infection like an odor, increased redness, warmth. -Dr. Cannon Kettle

## 2020-04-15 ENCOUNTER — Encounter: Payer: Self-pay | Admitting: Sports Medicine

## 2020-04-15 ENCOUNTER — Other Ambulatory Visit: Payer: Self-pay

## 2020-04-15 ENCOUNTER — Telehealth: Payer: Self-pay | Admitting: Cardiology

## 2020-04-15 ENCOUNTER — Other Ambulatory Visit: Payer: Self-pay | Admitting: Sports Medicine

## 2020-04-15 ENCOUNTER — Ambulatory Visit (INDEPENDENT_AMBULATORY_CARE_PROVIDER_SITE_OTHER): Payer: Commercial Managed Care - PPO | Admitting: Sports Medicine

## 2020-04-15 DIAGNOSIS — E1165 Type 2 diabetes mellitus with hyperglycemia: Secondary | ICD-10-CM

## 2020-04-15 DIAGNOSIS — Z89419 Acquired absence of unspecified great toe: Secondary | ICD-10-CM

## 2020-04-15 DIAGNOSIS — Z9889 Other specified postprocedural states: Secondary | ICD-10-CM

## 2020-04-15 DIAGNOSIS — T8131XD Disruption of external operation (surgical) wound, not elsewhere classified, subsequent encounter: Secondary | ICD-10-CM

## 2020-04-15 NOTE — Telephone Encounter (Signed)
Pt has paperwork for preop clearance.  Call to [facility] in [place] requesting they fax over required preop clearance paperwork to [facility] [phone].

## 2020-04-15 NOTE — Patient Instructions (Signed)
Pre-Operative Instructions  Congratulations, you have decided to take an important step towards improving your quality of life.  You can be assured that the doctors and staff at [facility] will be with you every step of the way.  Here are some important things you should know:  1. Plan to be at the surgery center/hospital at least 1 (one) hour prior to your scheduled time, unless otherwise directed by the surgical center/hospital staff.  You must have a responsible adult accompany you, remain during the surgery and drive you home.  Make sure you have directions to the surgical center/hospital to ensure you arrive on time. 2. If you are having surgery at [facility] or [facility] hospitals, you will need a copy of your medical history and physical form from your family physician within one month prior to the date of surgery. We will give you a form for your primary physician to complete.  3. We make every effort to accommodate the date you request for surgery.  However, there are times where surgery dates or times have to be moved.  We will contact you as soon as possible if a change in schedule is required.   4. No aspirin/ibuprofen for one week before surgery.  If you are on aspirin, any non-steroidal anti-inflammatory medications (Mobic, Aleve, Ibuprofen) should not be taken seven (7) days prior to your surgery.  You make take Tylenol for pain prior to surgery.  5. Medications - If you are taking daily heart and blood pressure medications, seizure, reflux, allergy, asthma, anxiety, pain or diabetes medications, make sure you notify the surgery center/hospital before the day of surgery so they can tell you which medications you should take or avoid the day of surgery. 6. No food or drink after midnight the night before surgery unless directed otherwise by surgical center/hospital staff. 7. No alcoholic beverages 24-hours prior to surgery.  No smoking 24-hours prior or 24-hours after  surgery. 8. Wear loose pants or shorts. They should be loose enough to fit over bandages, boots, and casts. 9. Don't wear slip-on shoes. Sneakers are preferred. 10. Bring your boot with you to the surgery center/hospital.  Also bring crutches or a walker if your physician has prescribed it for you.  If you do not have this equipment, it will be provided for you after surgery. 11. If you have not been contacted by the surgery center/hospital by the day before your surgery, call to confirm the date and time of your surgery. 12. Leave-time from work may vary depending on the type of surgery you have.  Appropriate arrangements should be made prior to surgery with your employer. 13. Prescriptions will be provided immediately following surgery by your doctor.  Fill these as soon as possible after surgery and take the medication as directed. Pain medications will not be refilled on weekends and must be approved by the doctor. 14. Remove nail polish on the operative foot and avoid getting pedicures prior to surgery. 15. Wash the night before surgery.  The night before surgery wash the foot and leg well with water and the antibacterial soap provided. Be sure to pay special attention to beneath the toenails and in between the toes.  Wash for at least three (3) minutes. Rinse thoroughly with water and dry well with a towel.  Perform this wash unless told not to do so by your physician.  Enclosed: 1 Ice pack (please put in freezer the night before surgery)   1 Hibiclens skin cleaner     Pre-op instructions  If you have any questions regarding the instructions, please do not hesitate to call our office.  [place]: [address], [place], [place] [address] -- [phone]  [place]: [address] [place], [place] [address] -- [phone]  [place]: [address], [place], [place] [address] -- [phone]   Website: [email or web address] 

## 2020-04-15 NOTE — Progress Notes (Signed)
Subjective: Jasmine Richardson is a 59 y.o. female patient seen today in office for POV # 7 (DOS 02/20/2020), S/P right hallux amputation performed on 02/20/2020 with Dr. Posey Pronto. Patient reports that the sutures that were placed on last Friday have busted open and they noticed it on Sunday when they change the bandage.  Patient denies pain at surgical site, reports drainage and son has been helping her with applying Betadine and Steri-Strips to the area, patient denies calf pain, denies headache, chest pain, shortness of breath, nausea, vomiting, fever, or chills.   Fasting blood sugar 222 and last A1c unknown  Patient Active Problem List   Diagnosis Date Noted  . Diabetic foot ulcer ([place]) 02/16/2020  . Angina pectoris ([place]) 09/11/2017  . Emphysema lung ([place]) 09/11/2017  . Chronic diastolic heart failure ([place]) 11/06/2016  . Mediastinitis 06/28/2016  . Wound, surgical, infected 06/06/2016  . S/P CABG (coronary artery bypass graft) 06/03/2016  . GERD (gastroesophageal reflux disease) 05/08/2016  . Morbid obesity ([place]) 05/08/2016  . Uncontrolled type 2 diabetes mellitus ([place]) 05/08/2016  . Tobacco use disorder 04/20/2016  . Coronary artery disease involving native coronary artery of native heart with angina pectoris ([place]) 09/12/2015  . Essential hypertension 09/12/2015  . Hyperlipidemia 09/12/2015    Current Outpatient Medications on File Prior to Visit  Medication Sig Dispense Refill  . aspirin 81 MG chewable tablet Chew by mouth.    . clopidogrel (PLAVIX) 75 MG tablet Take 1 tablet (75 mg total) by mouth daily. 30 tablet 10  . Cyanocobalamin (VITAMIN B 12 PO) Take 1 tablet by mouth daily.    . furosemide (LASIX) 40 MG tablet Take 1 tablet by mouth once daily (Patient taking differently: Take 40 mg by mouth daily. ) 90 tablet 2  . glipiZIDE (GLUCOTROL) 10 MG tablet Take 10 mg by mouth 2 (two) times daily.    . Insulin Glargine (BASAGLAR KWIKPEN) 100 UNIT/ML Inject 0.2 mLs (20 Units total) into the  skin at bedtime. 15 mL 0  . insulin regular (NOVOLIN R) 100 units/mL injection Inject into the skin.    [name] JANUVIA 100 MG tablet Take 100 mg by mouth daily.    [name] LORazepam (ATIVAN) 1 MG tablet Take 1 mg by mouth as needed for anxiety.    . metFORMIN (GLUCOPHAGE-XR) 500 MG 24 hr tablet Take 1,000 mg by mouth 2 (two) times daily.    . metoprolol succinate (TOPROL-XL) 50 MG 24 hr tablet Take 150 mg by mouth daily.     [name] neomycin-polymyxin-dexameth (MAXITROL) 0.1 % OINT Place 1 application into the left eye as needed.    . nitroGLYCERIN (NITROSTAT) 0.4 MG SL tablet Place 1 tablet (0.4 mg total) under the tongue every 5 (five) minutes x 3 doses as needed for chest pain. 30 tablet 11  . omeprazole (PRILOSEC) 20 MG capsule Take 1 capsule (20 mg total) by mouth daily. 90 capsule 1  . pravastatin (PRAVACHOL) 20 MG tablet TAKE 1 TABLET BY MOUTH ONCE DAILY IN THE EVENING (Patient taking differently: Take 20 mg by mouth every evening. ) 90 tablet 2  . ramipril (ALTACE) 1.25 MG capsule Take 1 capsule (1.25 mg total) by mouth 2 (two) times daily. 60 capsule 3  . vitamin C (ASCORBIC ACID) 250 MG tablet Take 250 mg by mouth daily.    . Vitamin D, Ergocalciferol, (DRISDOL) 50000 units CAPS capsule Take 50,000 Units by mouth every 7 (seven) days. Saturdays     No current facility-administered medications on file prior to  visit.    Allergies  Allergen Reactions  . Tramadol   . Codeine Rash    Objective: There were no vitals filed for this visit.  General: No acute distress, AAOx3  Right foot: Sutures broken with central dehiscence at surgical site measures  2.5x 1 x 0.5 cm post debridement with fatty tissue and met head exposed, there is no maceration, mild swelling to the amputation stump site, blanchable erythema, no warmth, no other signs of infection noted, Capillary fill time <5 seconds in all lesser digits, protective sensation severely diminished.  No pain or crepitation with range of motion right  foot.  No pain with calf compression.   Assessment and Plan:  Problem List Items Addressed This Visit      Endocrine   Uncontrolled type 2 diabetes mellitus ([place])    Other Visit Diagnoses    Postoperative wound dehiscence, subsequent encounter    -  Primary   History of amputation of hallux (HCC)       S/P foot surgery, right           -Patient seen and evaluated -Discussed with patient repeat wound dehiscence in the setting of likely poor healing at amputation stump site flat and pressure from long metatarsal -Discussed with patient need for surgical intervention for suturing and possible removal of additional metatarsal head -Patient opt for surgical management. Consent obtained for removed bone at the first metatarsal and resuture area closed at amputation stump site on the right. Pre and Post op course explained. Risks, benefits, alternatives explained. No guarantees given or implied. Surgical booking slip submitted and provided patient with Surgical packet and info for [facility] day. -Patient to get medical clearance from her cardiologist Dr. [name] -Meanwhile son to help with changing dressings daily applying Betadine to the area and Steri-Strips -Advised patient to limit activity to necessity and to continue with postoperative shoe with offloading padding and cane like before  -Advised patient to continue with rest and elevation as instructed -Continue with Tylenol as needed for pain -Will plan for wound check at next office visit until time for surgery. Landis Martins, DPM

## 2020-04-15 NOTE — Telephone Encounter (Signed)
Patient is calling in regards to clearance for procedure she is having on her foot due to diabetes. She states the office performing the surgery is requesting for Dr. [name] to complete forms for preop clearance. She would like to bring the paperwork by the office in [place] for Dr. [name] to sign. I made the patient aware that the office performing the procedure will also need to contact our office for clearance. Please advise as to whether or not the patient is eligible to drop off paperwork at the office.

## 2020-04-18 ENCOUNTER — Telehealth: Payer: Self-pay

## 2020-04-18 NOTE — Telephone Encounter (Signed)
Callback- can you clarify which medications are requested to be held. Notes only indicate medical clearance, but appears on asa/plavix as well. Thanks!

## 2020-04-18 NOTE — Telephone Encounter (Signed)
   [facility] Pre-operative Risk Assessment    Request for surgical clearance:  1. What type of surgery is being performed? Right removal of 1st metatarsal head and suture close amputation stump site.   2. When is this surgery scheduled? TBD   3. What type of clearance is required (medical clearance vs. Pharmacy clearance to hold med vs. Both)? Medical/Pharamcy  4. Are there any medications that need to be held prior to surgery and how long? None noted   5. Practice name and name of physician performing surgery? [facility].    6. What is your office phone number [phone]    7.   What is your office fax number [phone]  8.   Anesthesia type (None, local, MAC, general) ? [place] 04/18/2020, 9:24 AM  _________________________________________________________________   (provider comments below)

## 2020-04-19 NOTE — Telephone Encounter (Signed)
Jasmine Richardson from [facility] is requesting to have the surgical clearance form faxed to them at [phone]. Attention to [facility].

## 2020-04-19 NOTE — Telephone Encounter (Signed)
   Primary Cardiologist: No primary care provider on file.  Chart reviewed as part of pre-operative protocol coverage. Given past medical history and time since last visit, based on ACC/AHA guidelines, Jasmine Richardson would be at acceptable risk for the planned procedure without further cardiovascular testing. She may hold ASA and Plavix for 7 days prior to procedure, but will need to start again ASAP after surgery is completed. Spoke with [name] at [facility] by phone this am as well. They will notify the patient.   I will route this recommendation to the requesting party via Epic fax function and remove from pre-op pool.  Please call with questions.  Phill Myron. [place] Pugh, ANP, AACC  04/19/2020, 12:05 PM

## 2020-04-19 NOTE — Telephone Encounter (Signed)
Called Shelly Durant back to clarify whether patient needs to have the medications held prior to surgery or not. LVM 04/19/2020 11:15 am.

## 2020-04-20 ENCOUNTER — Telehealth: Payer: Self-pay | Admitting: Cardiology

## 2020-04-20 ENCOUNTER — Encounter (HOSPITAL_BASED_OUTPATIENT_CLINIC_OR_DEPARTMENT_OTHER): Payer: Self-pay | Admitting: Sports Medicine

## 2020-04-20 ENCOUNTER — Other Ambulatory Visit: Payer: Self-pay

## 2020-04-20 NOTE — Telephone Encounter (Signed)
I am routing this message to the pre-op pool since they were handling this pre-op clearance and I did not receive any forms on Jasmine Richardson. I am thinking that maybe this form was sent to them.

## 2020-04-20 NOTE — Telephone Encounter (Signed)
Follow Up:   Jasmine Richardson is checking on the status of pt's  History and Physical Form that she faxed on 04-15-20. She need this filled out  And faxed asap to [phone]-. Pt is scheduled for surgery on 04-25-20

## 2020-04-21 ENCOUNTER — Telehealth: Payer: Self-pay

## 2020-04-21 NOTE — Telephone Encounter (Signed)
DOS 04/25/20  1ST MET HEAD RES RT - 29191 WOUND CLOSURE RT - 13160  [facility] EFFECTIVE DATE - 12/18/2019  PLAN DEDUCTIBLE - $1500.00 W/ $0.00 REMAINING OUT OF POCKET - $5000 W/ $4970.00 REMAINING COPAY $30 COINSURANCE - 70%  SPOKE TO JOHNNY AT [facility], HE STATED NO PRECERT IS REQUIRED FOR [place]PT [address] [place] [address]. CALL REF # [id number]

## 2020-04-21 NOTE — Telephone Encounter (Signed)
Follow up  Pt called to follow up her clearance for her upcoming surgery. She said according to [facility] they haven't receive clearance yet

## 2020-04-21 NOTE — Telephone Encounter (Signed)
Received callback from patient requesting that preop clearance be re-faxed to the surgeon office. Will resend today.  Signed, Reino Bellis, NP-C 04/21/2020, 11:56 AM

## 2020-04-22 ENCOUNTER — Other Ambulatory Visit (HOSPITAL_COMMUNITY): Payer: Commercial Managed Care - PPO | Attending: Sports Medicine

## 2020-04-22 ENCOUNTER — Encounter: Payer: Commercial Managed Care - PPO | Admitting: Sports Medicine

## 2020-04-22 NOTE — Progress Notes (Signed)
Patient still needs H&P for surgery on 04/25/20. Spoke with nurse at Dr. Delia Chimes office and they are aware, and patient has until 12 noon to get the H&P completed or they are going to reschedule surgery. Dr. Leeanne Rio office to call back today with update.

## 2020-04-24 ENCOUNTER — Encounter (HOSPITAL_COMMUNITY): Payer: Self-pay | Admitting: Anesthesiology

## 2020-04-24 NOTE — Anesthesia Preprocedure Evaluation (Deleted)
Anesthesia Evaluation    Reviewed: Allergy & Precautions, Patient's Chart, lab work & pertinent test results  History of Anesthesia Complications Negative for: history of anesthetic complications  Airway        Dental   Pulmonary shortness of breath, COPD, former smoker,  02/16/2020 SARS Coronavirus NEG          Cardiovascular hypertension, Pt. on home beta blockers and Pt. on medications (-) angina+ CAD and + CABG (2017)    '17 ECHO: mild LVH, Mildly decreased left ventricular systolic function, ejection fraction 45% with inferior akinesis  Degenerative mitral valve disease  Dilated left atrium - mild  Aortic sclerosis  Normal right ventricular systolic function  No apparent valvular vegetations   Neuro/Psych negative neurological ROS     GI/Hepatic Neg liver ROS, GERD  Medicated and Controlled,  Endo/Other  diabetes, Poorly Controlled, Type 2, Oral Hypoglycemic Agents, Insulin DependentMorbid obesity  Renal/GU Renal InsufficiencyRenal disease     Musculoskeletal   Abdominal (+) - obese,   Peds  Hematology  (+) Blood dyscrasia (Hb 9.6), ,   Anesthesia Other Findings   Reproductive/Obstetrics                             Anesthesia Physical  Anesthesia Plan  ASA: III  Anesthesia Plan: MAC   Post-op Pain Management:    Induction:   PONV Risk Score and Plan: 2 and Ondansetron and Treatment may vary due to age or medical condition  Airway Management Planned: Natural Airway, Simple Face Mask and Nasal Cannula  Additional Equipment: None  Intra-op Plan:   Post-operative Plan:   Informed Consent:   Plan Discussed with:   Anesthesia Plan Comments:         Anesthesia Quick Evaluation

## 2020-04-25 ENCOUNTER — Ambulatory Visit (HOSPITAL_BASED_OUTPATIENT_CLINIC_OR_DEPARTMENT_OTHER)
Admission: RE | Admit: 2020-04-25 | Payer: Commercial Managed Care - PPO | Source: Home / Self Care | Admitting: Sports Medicine

## 2020-04-25 SURGERY — EXCISION, METATARSAL BONE, HEAD
Anesthesia: Monitor Anesthesia Care | Site: Toe | Laterality: Right

## 2020-04-27 ENCOUNTER — Other Ambulatory Visit: Payer: Self-pay

## 2020-04-27 ENCOUNTER — Encounter: Payer: Self-pay | Admitting: Sports Medicine

## 2020-04-27 ENCOUNTER — Encounter (HOSPITAL_BASED_OUTPATIENT_CLINIC_OR_DEPARTMENT_OTHER): Payer: Self-pay | Admitting: Sports Medicine

## 2020-04-27 ENCOUNTER — Ambulatory Visit (INDEPENDENT_AMBULATORY_CARE_PROVIDER_SITE_OTHER): Payer: Commercial Managed Care - PPO | Admitting: Sports Medicine

## 2020-04-27 DIAGNOSIS — Z9889 Other specified postprocedural states: Secondary | ICD-10-CM

## 2020-04-27 DIAGNOSIS — E1165 Type 2 diabetes mellitus with hyperglycemia: Secondary | ICD-10-CM

## 2020-04-27 DIAGNOSIS — Z89419 Acquired absence of unspecified great toe: Secondary | ICD-10-CM

## 2020-04-27 DIAGNOSIS — T8149XA Infection following a procedure, other surgical site, initial encounter: Secondary | ICD-10-CM

## 2020-04-27 DIAGNOSIS — T8131XD Disruption of external operation (surgical) wound, not elsewhere classified, subsequent encounter: Secondary | ICD-10-CM

## 2020-04-27 MED ORDER — SULFAMETHOXAZOLE-TRIMETHOPRIM 400-80 MG PO TABS
1.0000 | ORAL_TABLET | Freq: Two times a day (BID) | ORAL | 0 refills | Status: DC
Start: 2020-04-27 — End: 2020-06-23

## 2020-04-27 NOTE — Progress Notes (Signed)
Reviewed chart with Dr Linna Caprice. Ok to proceed as scheduled

## 2020-04-27 NOTE — Progress Notes (Addendum)
Subjective: Jasmine Richardson is a 59 y.o. female patient seen today in office for POV #8 (DOS 02/20/2020), S/P right hallux amputation performed on 02/20/2020 with Dr. Posey Pronto. Patient reports that she went to her PCP and had a history and physical done and is on schedule for Monday for surgery but does admit a increase in drainage from the area and some small amounts of odor, denies calf pain, denies headache, chest pain, shortness of breath, nausea, vomiting, fever, or chills.   Fasting blood sugar 228 and last A1c unknown  Patient Active Problem List   Diagnosis Date Noted  . Diabetic foot ulcer ([place]) 02/16/2020  . Angina pectoris ([place]) 09/11/2017  . Emphysema lung ([place]) 09/11/2017  . Chronic diastolic heart failure ([place]) 11/06/2016  . Mediastinitis 06/28/2016  . Wound, surgical, infected 06/06/2016  . S/P CABG (coronary artery bypass graft) 06/03/2016  . GERD (gastroesophageal reflux disease) 05/08/2016  . Morbid obesity ([place]) 05/08/2016  . Uncontrolled type 2 diabetes mellitus ([place]) 05/08/2016  . Tobacco use disorder 04/20/2016  . Coronary artery disease involving native coronary artery of native heart with angina pectoris ([place]) 09/12/2015  . Essential hypertension 09/12/2015  . Hyperlipidemia 09/12/2015    Current Outpatient Medications on File Prior to Visit  Medication Sig Dispense Refill  . aspirin 81 MG chewable tablet Chew by mouth.    . clopidogrel (PLAVIX) 75 MG tablet Take 1 tablet (75 mg total) by mouth daily. 30 tablet 10  . Cyanocobalamin (VITAMIN B 12 PO) Take 1 tablet by mouth daily.    . furosemide (LASIX) 40 MG tablet Take 1 tablet by mouth once daily (Patient taking differently: Take 40 mg by mouth daily. ) 90 tablet 2  . glipiZIDE (GLUCOTROL) 10 MG tablet Take 10 mg by mouth 2 (two) times daily.    . Insulin Glargine (BASAGLAR KWIKPEN) 100 UNIT/ML Inject 0.2 mLs (20 Units total) into the skin at bedtime. 15 mL 0  . JANUVIA 100 MG tablet Take 100 mg by mouth daily.    [name]  LORazepam (ATIVAN) 1 MG tablet Take 1 mg by mouth as needed for anxiety.    . metFORMIN (GLUCOPHAGE-XR) 500 MG 24 hr tablet Take 1,000 mg by mouth 2 (two) times daily.    . metoprolol succinate (TOPROL-XL) 50 MG 24 hr tablet Take 150 mg by mouth daily.     . nitroGLYCERIN (NITROSTAT) 0.4 MG SL tablet Place 1 tablet (0.4 mg total) under the tongue every 5 (five) minutes x 3 doses as needed for chest pain. 30 tablet 11  . omeprazole (PRILOSEC) 20 MG capsule Take 1 capsule (20 mg total) by mouth daily. 90 capsule 1  . pravastatin (PRAVACHOL) 20 MG tablet TAKE 1 TABLET BY MOUTH ONCE DAILY IN THE EVENING (Patient taking differently: Take 20 mg by mouth every evening. ) 90 tablet 2  . ramipril (ALTACE) 1.25 MG capsule Take 1 capsule (1.25 mg total) by mouth 2 (two) times daily. 60 capsule 3  . vitamin C (ASCORBIC ACID) 250 MG tablet Take 250 mg by mouth daily.    . Vitamin D, Ergocalciferol, (DRISDOL) 50000 units CAPS capsule Take 50,000 Units by mouth every 7 (seven) days. Saturdays     No current facility-administered medications on file prior to visit.    Allergies  Allergen Reactions  . Tramadol   . Codeine Rash    Objective: There were no vitals filed for this visit.  General: No acute distress, AAOx3  Right foot: Central dehiscence at surgical site measures  2.0x 0.5 x 0.5 cm post debridement with fatty tissue and met head exposed, there is no maceration, increased swelling to the amputation stump site, increased blanchable erythema, no significant mall odor, no warmth, no other signs of infection noted, Capillary fill time <5 seconds in all lesser digits, protective sensation severely diminished.  No pain or crepitation with range of motion right foot.  No pain with calf compression.   Assessment and Plan:  Problem List Items Addressed This Visit      Endocrine   Uncontrolled type 2 diabetes mellitus ([place])     Other   Wound, surgical, infected   Relevant Medications    sulfamethoxazole-trimethoprim (BACTRIM) 400-80 MG tablet   Other Relevant Orders   WOUND CULTURE    Other Visit Diagnoses    Postoperative wound dehiscence, subsequent encounter    -  Primary   Relevant Orders   WOUND CULTURE   History of amputation of hallux (HCC)       S/P foot surgery, right           -Patient seen and evaluated -Discussed with patient extent of nonhealing amputation stump site -Cleanse ulceration and using a sterile tissue nipper removing some fatty tissue at the area of dehiscence due to the increased amount of odor and drainage per patient I did do a wound culture for surveillance and put patient preemptively on a course of Bactrim -Advised patient to discontinue using Betadine and Steri-Strips and to switch over using Iodosorb in the central open area of the amputation stump site; patient has son present who will help with doing his dressing changes -Advised patient to limit activity to necessity and to continue with postoperative shoe with offloading padding and cane like before  -Advised patient to continue with rest and elevation as instructed -Continue with Tylenol as needed for pain -Patient will be going to the OR on Monday 5/17 for primary wound closure and removal of metatarsal head reassured patient at this time that this is still the plan of procedure however depending on the extent of the wound on Monday may have to shave down or resect more bone.  Patient expressed understanding and states that she is worried about her foot and just wants it to be healed. Landis Martins, DPM

## 2020-04-27 NOTE — Progress Notes (Signed)
Patient states she cannot go for covid testing Friday 5/14 or Sat 5/15 during the [facility] operating hours.  Spoke with Amedeo Plenty RN Clinical Supervisor at [facility] who stated that patient would have to speak to Dr Cannon Kettle regarding this, and reschedule surgery if pt cannot get a covid test.

## 2020-04-28 ENCOUNTER — Inpatient Hospital Stay (HOSPITAL_COMMUNITY): Admission: RE | Admit: 2020-04-28 | Payer: Commercial Managed Care - PPO | Source: Ambulatory Visit

## 2020-04-29 NOTE — Progress Notes (Signed)
Called Dr Leeanne Rio office to inform that patient has not gone for scheduled covid test at this point. Left message for Shelly on VM. Also provided information that patient stated to PAT nurse during preop interview that she could not go for covid test today (Friday) or tomorrow (Saturday). Patient was informed that surgery could not proceed without a negative covid test, so would have to be rescheduled if she is unable to get tested today or tomorrow. Called patient and left VM that surgery would be cancelled if no covid test.

## 2020-04-30 ENCOUNTER — Other Ambulatory Visit (HOSPITAL_COMMUNITY)
Admission: RE | Admit: 2020-04-30 | Discharge: 2020-04-30 | Disposition: A | Discharge status: Home / Self Care | Payer: Commercial Managed Care - PPO | Source: Ambulatory Visit | Attending: Sports Medicine | Admitting: Sports Medicine

## 2020-04-30 DIAGNOSIS — Z01812 Encounter for preprocedural laboratory examination: Secondary | ICD-10-CM | POA: Diagnosis present

## 2020-04-30 DIAGNOSIS — Z20822 Contact with and (suspected) exposure to covid-19: Secondary | ICD-10-CM | POA: Insufficient documentation

## 2020-04-30 LAB — WOUND CULTURE

## 2020-04-30 LAB — SARS CORONAVIRUS 2 (TAT 6-24 HRS): SARS Coronavirus 2: NEGATIVE

## 2020-05-01 NOTE — Anesthesia Preprocedure Evaluation (Addendum)
Anesthesia Evaluation  Patient identified by MRN, date of birth, ID band Patient awake    Reviewed: Allergy & Precautions, NPO status , Patient's Chart, lab work & pertinent test results  Airway Mallampati: II  TM Distance: >3 FB Neck ROM: Full    Dental no notable dental hx. (+) Dental Advisory Given, Edentulous Upper, Edentulous Lower   Pulmonary COPD, former smoker,  Quit smoking 2017, smoked for a lot of years Has never seen pulmonologist, has no inhalers   Pulmonary exam normal breath sounds clear to auscultation       Cardiovascular hypertension, Pt. on medications and Pt. on home beta blockers + CAD, + Cardiac Stents and + CABG  Normal cardiovascular exam Rhythm:Regular Rate:Normal  PCI and stent of RCA 2009, last cath 2012 with medical therapy    CABG May 2017  Had chest pain a few months ago, has an echo scheduled with her cardiologist in a couple weeks   Neuro/Psych negative neurological ROS  negative psych ROS   GI/Hepatic Neg liver ROS, GERD  Medicated and Controlled,  Endo/Other  diabetes, Poorly Controlled, Type 2, Oral Hypoglycemic AgentsObesity BMI 36 Last A1c 11.7 FS have been high at home  Renal/GU negative Renal ROS  negative genitourinary   Musculoskeletal negative musculoskeletal ROS (+)   Abdominal (+) + obese,   Peds  Hematology negative hematology ROS (+)   Anesthesia Other Findings Right foot 1st metatarsal wound dehiscence s/p amputation  Reproductive/Obstetrics negative OB ROS S/p hysterectomy                           Anesthesia Physical Anesthesia Plan  ASA: III  Anesthesia Plan: MAC   Post-op Pain Management:    Induction:   PONV Risk Score and Plan: 2 and Propofol infusion and TIVA  Airway Management Planned: Natural Airway and Simple Face Mask  Additional Equipment: None  Intra-op Plan:   Post-operative Plan:   Informed Consent: I have  reviewed the patients History and Physical, chart, labs and discussed the procedure including the risks, benefits and alternatives for the proposed anesthesia with the patient or authorized representative who has indicated his/her understanding and acceptance.       Plan Discussed with: CRNA  Anesthesia Plan Comments: (MAC with local by surgeon)       Anesthesia Quick Evaluation

## 2020-05-02 ENCOUNTER — Other Ambulatory Visit: Payer: Self-pay

## 2020-05-02 ENCOUNTER — Encounter (HOSPITAL_BASED_OUTPATIENT_CLINIC_OR_DEPARTMENT_OTHER): Payer: Self-pay | Admitting: Sports Medicine

## 2020-05-02 ENCOUNTER — Ambulatory Visit (HOSPITAL_BASED_OUTPATIENT_CLINIC_OR_DEPARTMENT_OTHER): Payer: Commercial Managed Care - PPO | Admitting: Anesthesiology

## 2020-05-02 ENCOUNTER — Encounter (HOSPITAL_BASED_OUTPATIENT_CLINIC_OR_DEPARTMENT_OTHER)
Admission: RE | Disposition: A | Discharge status: Home / Self Care | Payer: Self-pay | Source: Home / Self Care | Attending: Sports Medicine

## 2020-05-02 ENCOUNTER — Ambulatory Visit (HOSPITAL_BASED_OUTPATIENT_CLINIC_OR_DEPARTMENT_OTHER)
Admission: RE | Admit: 2020-05-02 | Discharge: 2020-05-02 | Disposition: A | Discharge status: Home / Self Care | Payer: Commercial Managed Care - PPO | Attending: Sports Medicine | Admitting: Sports Medicine

## 2020-05-02 DIAGNOSIS — Z955 Presence of coronary angioplasty implant and graft: Secondary | ICD-10-CM | POA: Insufficient documentation

## 2020-05-02 DIAGNOSIS — Z951 Presence of aortocoronary bypass graft: Secondary | ICD-10-CM | POA: Insufficient documentation

## 2020-05-02 DIAGNOSIS — Z87891 Personal history of nicotine dependence: Secondary | ICD-10-CM | POA: Insufficient documentation

## 2020-05-02 DIAGNOSIS — Z794 Long term (current) use of insulin: Secondary | ICD-10-CM | POA: Diagnosis not present

## 2020-05-02 DIAGNOSIS — E1140 Type 2 diabetes mellitus with diabetic neuropathy, unspecified: Secondary | ICD-10-CM | POA: Insufficient documentation

## 2020-05-02 DIAGNOSIS — I5032 Chronic diastolic (congestive) heart failure: Secondary | ICD-10-CM | POA: Insufficient documentation

## 2020-05-02 DIAGNOSIS — I110 Hypertensive heart disease with heart failure: Secondary | ICD-10-CM | POA: Insufficient documentation

## 2020-05-02 DIAGNOSIS — E11621 Type 2 diabetes mellitus with foot ulcer: Secondary | ICD-10-CM

## 2020-05-02 DIAGNOSIS — Z79899 Other long term (current) drug therapy: Secondary | ICD-10-CM | POA: Diagnosis not present

## 2020-05-02 DIAGNOSIS — M869 Osteomyelitis, unspecified: Secondary | ICD-10-CM | POA: Diagnosis not present

## 2020-05-02 DIAGNOSIS — J439 Emphysema, unspecified: Secondary | ICD-10-CM | POA: Diagnosis not present

## 2020-05-02 DIAGNOSIS — Z6836 Body mass index (BMI) 36.0-36.9, adult: Secondary | ICD-10-CM | POA: Diagnosis not present

## 2020-05-02 DIAGNOSIS — I25119 Atherosclerotic heart disease of native coronary artery with unspecified angina pectoris: Secondary | ICD-10-CM | POA: Insufficient documentation

## 2020-05-02 DIAGNOSIS — K219 Gastro-esophageal reflux disease without esophagitis: Secondary | ICD-10-CM | POA: Insufficient documentation

## 2020-05-02 DIAGNOSIS — Z7982 Long term (current) use of aspirin: Secondary | ICD-10-CM | POA: Insufficient documentation

## 2020-05-02 DIAGNOSIS — Z89411 Acquired absence of right great toe: Secondary | ICD-10-CM | POA: Insufficient documentation

## 2020-05-02 DIAGNOSIS — E785 Hyperlipidemia, unspecified: Secondary | ICD-10-CM | POA: Insufficient documentation

## 2020-05-02 DIAGNOSIS — T8130XA Disruption of wound, unspecified, initial encounter: Secondary | ICD-10-CM | POA: Diagnosis present

## 2020-05-02 DIAGNOSIS — M86671 Other chronic osteomyelitis, right ankle and foot: Secondary | ICD-10-CM

## 2020-05-02 DIAGNOSIS — Z8249 Family history of ischemic heart disease and other diseases of the circulatory system: Secondary | ICD-10-CM | POA: Insufficient documentation

## 2020-05-02 DIAGNOSIS — Z9889 Other specified postprocedural states: Secondary | ICD-10-CM

## 2020-05-02 DIAGNOSIS — X58XXXA Exposure to other specified factors, initial encounter: Secondary | ICD-10-CM | POA: Insufficient documentation

## 2020-05-02 HISTORY — PX: METATARSAL HEAD EXCISION: SHX5027

## 2020-05-02 LAB — GLUCOSE, CAPILLARY
Glucose-Capillary: 225 mg/dL — ABNORMAL HIGH (ref 70–99)
Glucose-Capillary: 232 mg/dL — ABNORMAL HIGH (ref 70–99)

## 2020-05-02 SURGERY — EXCISION, METATARSAL BONE, HEAD
Anesthesia: Monitor Anesthesia Care | Site: Toe | Laterality: Right

## 2020-05-02 MED ORDER — ONDANSETRON HCL 4 MG/2ML IJ SOLN
INTRAMUSCULAR | Status: AC
  Filled 2020-05-02: qty 4

## 2020-05-02 MED ORDER — ACETAMINOPHEN 500 MG PO TABS
ORAL_TABLET | ORAL | Status: AC
  Filled 2020-05-02: qty 2

## 2020-05-02 MED ORDER — LIDOCAINE HCL (PF) 1 % IJ SOLN
INTRAMUSCULAR | Status: AC
  Filled 2020-05-02: qty 30

## 2020-05-02 MED ORDER — ACETAMINOPHEN 500 MG PO TABS
1000.0000 mg | ORAL_TABLET | Freq: Four times a day (QID) | ORAL | 2 refills | Status: AC | PRN
Start: 2020-05-02 — End: 2021-05-02

## 2020-05-02 MED ORDER — PROMETHAZINE HCL 25 MG/ML IJ SOLN: 6.2500 mg | INTRAMUSCULAR | Status: DC | PRN

## 2020-05-02 MED ORDER — CEFAZOLIN SODIUM-DEXTROSE 2-4 GM/100ML-% IV SOLN
INTRAVENOUS | Status: AC
  Filled 2020-05-02: qty 100

## 2020-05-02 MED ORDER — KETOROLAC TROMETHAMINE 30 MG/ML IJ SOLN: 30.0000 mg | Freq: Once | INTRAMUSCULAR | Status: DC | PRN

## 2020-05-02 MED ORDER — CHLORHEXIDINE GLUCONATE CLOTH 2 % EX PADS: 6.0000 | MEDICATED_PAD | Freq: Once | CUTANEOUS | Status: DC

## 2020-05-02 MED ORDER — DOCUSATE SODIUM 100 MG PO CAPS
100.0000 mg | ORAL_CAPSULE | Freq: Every day | ORAL | 2 refills | Status: AC | PRN
Start: 2020-05-02 — End: 2021-05-02

## 2020-05-02 MED ORDER — FENTANYL CITRATE (PF) 100 MCG/2ML IJ SOLN: 50.0000 ug | INTRAMUSCULAR | Status: DC | PRN

## 2020-05-02 MED ORDER — ORAL CARE MOUTH RINSE: 15.0000 mL | Freq: Once | OROMUCOSAL | Status: DC

## 2020-05-02 MED ORDER — BUPIVACAINE HCL (PF) 0.5 % IJ SOLN
INTRAMUSCULAR | Status: AC
  Filled 2020-05-02: qty 30

## 2020-05-02 MED ORDER — PROPOFOL 500 MG/50ML IV EMUL
INTRAVENOUS | Status: DC | PRN
  Administered 2020-05-02: 75 ug/kg/min via INTRAVENOUS

## 2020-05-02 MED ORDER — OXYCODONE HCL 5 MG/5ML PO SOLN: 5.0000 mg | Freq: Once | ORAL | Status: DC | PRN

## 2020-05-02 MED ORDER — OXYCODONE HCL 5 MG PO TABS: 5.0000 mg | ORAL_TABLET | Freq: Once | ORAL | Status: DC | PRN

## 2020-05-02 MED ORDER — CHLORHEXIDINE GLUCONATE 0.12 % MT SOLN
15.0000 mL | Freq: Once | OROMUCOSAL | Status: DC
  Filled 2020-05-02: qty 15

## 2020-05-02 MED ORDER — HYDROMORPHONE HCL 1 MG/ML IJ SOLN: 0.2500 mg | INTRAMUSCULAR | Status: DC | PRN

## 2020-05-02 MED ORDER — MIDAZOLAM HCL 2 MG/2ML IJ SOLN
1.0000 mg | INTRAMUSCULAR | Status: DC | PRN
  Administered 2020-05-02: 1 mg via INTRAVENOUS

## 2020-05-02 MED ORDER — FENTANYL CITRATE (PF) 100 MCG/2ML IJ SOLN
INTRAMUSCULAR | Status: AC
  Filled 2020-05-02: qty 2

## 2020-05-02 MED ORDER — LIDOCAINE HCL (CARDIAC) PF 100 MG/5ML IV SOSY
PREFILLED_SYRINGE | INTRAVENOUS | Status: DC | PRN
  Administered 2020-05-02: 60 mg via INTRAVENOUS

## 2020-05-02 MED ORDER — LIDOCAINE 2% (20 MG/ML) 5 ML SYRINGE
INTRAMUSCULAR | Status: AC
  Filled 2020-05-02: qty 10

## 2020-05-02 MED ORDER — MEPERIDINE HCL 25 MG/ML IJ SOLN: 6.2500 mg | INTRAMUSCULAR | Status: DC | PRN

## 2020-05-02 MED ORDER — CEFAZOLIN SODIUM-DEXTROSE 2-4 GM/100ML-% IV SOLN
2.0000 g | INTRAVENOUS | Status: AC
  Administered 2020-05-02: 2 g via INTRAVENOUS

## 2020-05-02 MED ORDER — PROMETHAZINE HCL 12.5 MG PO TABS
12.5000 mg | ORAL_TABLET | Freq: Four times a day (QID) | ORAL | 0 refills | Status: DC | PRN
Start: 2020-05-02 — End: 2020-06-23

## 2020-05-02 MED ORDER — ACETAMINOPHEN 500 MG PO TABS
1000.0000 mg | ORAL_TABLET | Freq: Once | ORAL | Status: AC
  Administered 2020-05-02: 1000 mg via ORAL

## 2020-05-02 MED ORDER — LACTATED RINGERS IV SOLN: INTRAVENOUS | Status: DC

## 2020-05-02 MED ORDER — ONDANSETRON HCL 4 MG/2ML IJ SOLN
INTRAMUSCULAR | Status: DC | PRN
  Administered 2020-05-02: 4 mg via INTRAVENOUS

## 2020-05-02 MED ORDER — LIDOCAINE HCL 1 % IJ SOLN
INTRAMUSCULAR | Status: DC | PRN
  Administered 2020-05-02: 10 mL via INTRAMUSCULAR

## 2020-05-02 MED ORDER — MIDAZOLAM HCL 2 MG/2ML IJ SOLN
INTRAMUSCULAR | Status: AC
  Filled 2020-05-02: qty 2

## 2020-05-02 SURGICAL SUPPLY — 53 items
BLADE AVERAGE 25X9 (BLADE) ×2 IMPLANT
BLADE SURG 15 STRL LF DISP TIS (BLADE) ×2 IMPLANT
BLADE SURG 15 STRL SS (BLADE) ×4
BNDG CMPR 9X4 STRL LF SNTH (GAUZE/BANDAGES/DRESSINGS) ×1
BNDG ELASTIC 3X5.8 VLCR STR LF (GAUZE/BANDAGES/DRESSINGS) ×2 IMPLANT
BNDG ESMARK 4X9 LF (GAUZE/BANDAGES/DRESSINGS) ×2 IMPLANT
BNDG GAUZE ELAST 4 BULKY (GAUZE/BANDAGES/DRESSINGS) ×2 IMPLANT
COVER BACK TABLE 60X90IN (DRAPES) ×2 IMPLANT
COVER WAND RF STERILE (DRAPES) IMPLANT
CUFF TOURN SGL QUICK 18X4 (TOURNIQUET CUFF) ×1 IMPLANT
DRAPE EXTREMITY T 121X128X90 (DISPOSABLE) ×2 IMPLANT
DRAPE IMP U-DRAPE 54X76 (DRAPES) ×1 IMPLANT
DRAPE OEC MINIVIEW 54X84 (DRAPES) ×2 IMPLANT
DRAPE SURG 17X23 STRL (DRAPES) ×2 IMPLANT
DRSG EMULSION OIL 3X3 NADH (GAUZE/BANDAGES/DRESSINGS) IMPLANT
DRSG PAD ABDOMINAL 8X10 ST (GAUZE/BANDAGES/DRESSINGS) ×2 IMPLANT
DURAPREP 26ML APPLICATOR (WOUND CARE) ×2 IMPLANT
ELECT REM PT RETURN 9FT ADLT (ELECTROSURGICAL) ×2
ELECTRODE REM PT RTRN 9FT ADLT (ELECTROSURGICAL) ×1 IMPLANT
GAUZE 4X4 16PLY RFD (DISPOSABLE) IMPLANT
GAUZE SPONGE 4X4 12PLY STRL (GAUZE/BANDAGES/DRESSINGS) ×2 IMPLANT
GLOVE BIO SURGEON STRL SZ 6.5 (GLOVE) ×3 IMPLANT
GLOVE BIOGEL PI IND STRL 6.5 (GLOVE) ×1 IMPLANT
GLOVE BIOGEL PI IND STRL 7.0 (GLOVE) IMPLANT
GLOVE BIOGEL PI INDICATOR 6.5 (GLOVE) ×1
GLOVE BIOGEL PI INDICATOR 7.0 (GLOVE) ×2
GOWN STRL REUS W/ TWL LRG LVL3 (GOWN DISPOSABLE) ×2 IMPLANT
GOWN STRL REUS W/TWL LRG LVL3 (GOWN DISPOSABLE) ×4
HANDPIECE INTERPULSE COAX TIP (DISPOSABLE) ×2
MANIFOLD NEPTUNE II (INSTRUMENTS) ×1 IMPLANT
NDL HYPO 25X1 1.5 SAFETY (NEEDLE) ×1 IMPLANT
NEEDLE HYPO 25X1 1.5 SAFETY (NEEDLE) ×2 IMPLANT
NS IRRIG 1000ML POUR BTL (IV SOLUTION) IMPLANT
PADDING CAST ABS 4INX4YD NS (CAST SUPPLIES) ×1
PADDING CAST ABS COTTON 4X4 ST (CAST SUPPLIES) ×1 IMPLANT
PENCIL SMOKE EVACUATOR (MISCELLANEOUS) ×1 IMPLANT
SET BASIN DAY SURGERY F.S. (CUSTOM PROCEDURE TRAY) ×2 IMPLANT
SET HNDPC FAN SPRY TIP SCT (DISPOSABLE) IMPLANT
SLEEVE SCD COMPRESS KNEE MED (MISCELLANEOUS) ×2 IMPLANT
STOCKINETTE 6  STRL (DRAPES) ×2
STOCKINETTE 6 STRL (DRAPES) ×1 IMPLANT
STOCKINETTE SYNTHETIC 4 NONSTR (MISCELLANEOUS) ×2 IMPLANT
SUT ETHILON 3 0 PS 1 (SUTURE) ×1 IMPLANT
SUT ETHILON 4 0 PS 2 18 (SUTURE) IMPLANT
SUT MNCRL AB 3-0 PS2 18 (SUTURE) IMPLANT
SUT MNCRL AB 4-0 PS2 18 (SUTURE) IMPLANT
SUT MON AB 5-0 PS2 18 (SUTURE) IMPLANT
SUT VIC AB 2-0 PS2 27 (SUTURE) ×1 IMPLANT
SUT VIC AB 3-0 FS2 27 (SUTURE) ×2 IMPLANT
SUT VICRYL 4-0 PS2 18IN ABS (SUTURE) ×2 IMPLANT
SYR BULB EAR ULCER 3OZ GRN STR (SYRINGE) ×2 IMPLANT
TOWEL GREEN STERILE FF (TOWEL DISPOSABLE) ×2 IMPLANT
UNDERPAD 30X36 HEAVY ABSORB (UNDERPADS AND DIAPERS) ×2 IMPLANT

## 2020-05-02 NOTE — Anesthesia Procedure Notes (Signed)
Procedure Name: MAC Date/Time: 05/02/2020 7:48 AM Performed by: Signe Colt, CRNA Pre-anesthesia Checklist: Patient identified, Emergency Drugs available, Suction available, Patient being monitored and Timeout performed Patient Re-evaluated:Patient Re-evaluated prior to induction Oxygen Delivery Method: Simple face mask

## 2020-05-02 NOTE — Discharge Instructions (Signed)
Attached to paper chart   Post Anesthesia Home Care Instructions  Activity: Get plenty of rest for the remainder of the day. A responsible individual must stay with you for 24 hours following the procedure.  For the next 24 hours, DO NOT: -Drive a car -Paediatric nurse -Drink alcoholic beverages -Take any medication unless instructed by your physician -Make any legal decisions or sign important papers.  Meals: Start with liquid foods such as gelatin or soup. Progress to regular foods as tolerated. Avoid greasy, spicy, heavy foods. If nausea and/or vomiting occur, drink only clear liquids until the nausea and/or vomiting subsides. Call your physician if vomiting continues.  Special Instructions/Symptoms: Your throat may feel dry or sore from the anesthesia or the breathing tube placed in your throat during surgery. If this causes discomfort, gargle with warm salt water. The discomfort should disappear within 24 hours.  If you had a scopolamine patch placed behind your ear for the management of post- operative nausea and/or vomiting:  1. The medication in the patch is effective for 72 hours, after which it should be removed.  Wrap patch in a tissue and discard in the trash. Wash hands thoroughly with soap and water. 2. You may remove the patch earlier than 72 hours if you experience unpleasant side effects which may include dry mouth, dizziness or visual disturbances. 3. Avoid touching the patch. Wash your hands with soap and water after contact with the patch.  Discharge Instructions After Orthopedic Procedures:  *You may feel tired and weak following your procedure. It is recommended that you limit physical activity for the next 24 hours and rest at home for the remainder of today and tomorrow. *No strenuous activity should be started without your doctor's permission.  Elevate the extremity that you had surgery on to a level above your heart. This should continue for 48 hours or as  instructed by your doctor.  If you had hand, arm or shoulder surgery you should move your fingers frequently unless otherwise instructed by your doctor.  If you had foot, knee or leg surgery you should wiggle your toes frequently unless otherwise instructed by your doctor.  Follow your doctor's exact instructions for activity at home. Use your home equipment as instructed. (Crutches, hard shoes, slings etc.)  Limit your activity as instructed by your doctor.  Report to your doctor should any of the following occur: 1. Extreme swelling of your fingers or toes. 2. Inability to wiggle your fingers or toes. 3. Coldness, pale or bluish color in your fingers or toes. 4. Loss of sensation, numbness or tingling of your fingers or toes. 5. Unusual smell or odor from under your dressing or cast. 6. Excessive bleeding or drainage from the surgical site. 7. Pain not relieved by medication your doctor has prescribed for you. 8. Cast or dressing too tight (do not get your dressing or cast wet or put anything under          your dressing or cast.)  *Do not change your dressing unless instructed by your doctor or discharge nurse. Then follow exact instructions.  *Follow labeled instructions for any medications that your doctor may have prescribed for you. *Should any questions or complications develop following your procedure, PLEASE CONTACT YOUR DOCTOR.

## 2020-05-02 NOTE — Interval H&P Note (Signed)
Anesthesia H&P Update: History and Physical Exam reviewed; patient is OK for planned anesthetic and procedure. ? ?

## 2020-05-02 NOTE — Brief Op Note (Signed)
05/02/2020  8:33 AM  PATIENT:  Jasmine Richardson  59 y.o. female  PRE-OPERATIVE DIAGNOSIS:  FIRST METARSAL RIGHT [place];  RIGHT FOOT REVISION OF AMPUTATION STUMP SITE  POST-OPERATIVE DIAGNOSIS:  FIRST METARSAL RIGHT FOOTWOUND DEHISCENCE;   PROCEDURE:  Procedure(s) with comments: FIRST METATARSAL HEAD RESECTION; RIGHT FOOT WOUND CLOSURE (Right) - MAC W/LOCAL  SURGEON:  Surgeon(s) and Role:    * Haydon Kalmar, Training and development officer, DPM - Primary  PHYSICIAN ASSISTANT:   ASSISTANTS: none   ANESTHESIA:   MAC  EBL:  Minimal   BLOOD ADMINISTERED:none  DRAINS: none   LOCAL MEDICATIONS USED:  MARCAINE     SPECIMEN:  Source of Specimen:  Right foot 1st metatarsal head  DISPOSITION OF SPECIMEN:  PATHOLOGY  COUNTS:  YES  TOURNIQUET:   Total Tourniquet Time Documented: Calf (Right) - 36 minutes Total: Calf (Right) - 36 minutes   DICTATION: .Note written in EPIC  PLAN OF CARE: Discharge to home after PACU  PATIENT DISPOSITION:  PACU - hemodynamically stable.   Delay start of Pharmacological VTE agent (>24hrs) due to surgical blood loss or risk of bleeding: no

## 2020-05-02 NOTE — H&P (Signed)
H&P: Podiatry   QAS:TMHDQ Khurana 59 y.o. female  patient with a history of right hallux amputation performed by Dr. Posey Pronto seen on several occassions in office complaining of wound at amputation stump site; Patient has tried multiple conservative treatments and opted for Surgical management for this nonhealing wound at amputation stump site. Patient had pre-op physical and clearance for right met head removal and wound closure completed. Patient met this AM in pre-op holding area; informed consent reviewed and signed. All questions answered. Right foot/surgical site marked.  This am patient denies any other pedal complaints. Denies Nausea/fever/vomiting/chills/night sweats/overnight events. Confirms NPO since midnight.   Patient Active Problem List   Diagnosis Date Noted  . Diabetic foot ulcer ([place]) 02/16/2020  . Angina pectoris ([place]) 09/11/2017  . Emphysema lung ([place]) 09/11/2017  . Chronic diastolic heart failure ([place]) 11/06/2016  . Mediastinitis 06/28/2016  . Wound, surgical, infected 06/06/2016  . S/P CABG (coronary artery bypass graft) 06/03/2016  . GERD (gastroesophageal reflux disease) 05/08/2016  . Morbid obesity ([place]) 05/08/2016  . Uncontrolled type 2 diabetes mellitus ([place]) 05/08/2016  . Tobacco use disorder 04/20/2016  . Coronary artery disease involving native coronary artery of native heart with angina pectoris ([place]) 09/12/2015  . Essential hypertension 09/12/2015  . Hyperlipidemia 09/12/2015    No current facility-administered medications on file prior to encounter.   Current Outpatient Medications on File Prior to Encounter  Medication Sig Dispense Refill  . aspirin 81 MG chewable tablet Chew by mouth.    . clopidogrel (PLAVIX) 75 MG tablet Take 1 tablet (75 mg total) by mouth daily. 30 tablet 10  . Cyanocobalamin (VITAMIN B 12 PO) Take 1 tablet by mouth daily.    . furosemide (LASIX) 40 MG tablet Take 1 tablet by mouth once daily (Patient taking differently: Take 40  mg by mouth daily. ) 90 tablet 2  . glipiZIDE (GLUCOTROL) 10 MG tablet Take 10 mg by mouth 2 (two) times daily.    . Insulin Glargine (BASAGLAR KWIKPEN) 100 UNIT/ML Inject 0.2 mLs (20 Units total) into the skin at bedtime. 15 mL 0  . JANUVIA 100 MG tablet Take 100 mg by mouth daily.    . metFORMIN (GLUCOPHAGE-XR) 500 MG 24 hr tablet Take 1,000 mg by mouth 2 (two) times daily.    . metoprolol succinate (TOPROL-XL) 50 MG 24 hr tablet Take 150 mg by mouth daily.     [name] omeprazole (PRILOSEC) 20 MG capsule Take 1 capsule (20 mg total) by mouth daily. 90 capsule 1  . pravastatin (PRAVACHOL) 20 MG tablet TAKE 1 TABLET BY MOUTH ONCE DAILY IN THE EVENING (Patient taking differently: Take 20 mg by mouth every evening. ) 90 tablet 2  . ramipril (ALTACE) 1.25 MG capsule Take 1 capsule (1.25 mg total) by mouth 2 (two) times daily. 60 capsule 3  . vitamin C (ASCORBIC ACID) 250 MG tablet Take 250 mg by mouth daily.    . Vitamin D, Ergocalciferol, (DRISDOL) 50000 units CAPS capsule Take 50,000 Units by mouth every 7 (seven) days. Saturdays    . LORazepam (ATIVAN) 1 MG tablet Take 1 mg by mouth as needed for anxiety.    . nitroGLYCERIN (NITROSTAT) 0.4 MG SL tablet Place 1 tablet (0.4 mg total) under the tongue every 5 (five) minutes x 3 doses as needed for chest pain. 30 tablet 11     Allergies  Allergen Reactions  . Tramadol   . Codeine Rash    Social History   Socioeconomic History  . Marital  status: Married    Spouse name: Not on file  . Number of children: Not on file  . Years of education: Not on file  . Highest education level: Not on file  Occupational History  . Not on file  Tobacco Use  . Smoking status: Former Research scientist (life sciences)  . Smokeless tobacco: Never Used  Substance and Sexual Activity  . Alcohol use: No  . Drug use: No  . Sexual activity: Yes  Other Topics Concern  . Not on file  Social History Narrative  . Not on file   Social Determinants of Health   Financial Resource Strain:    . Difficulty of Paying Living Expenses:   Food Insecurity:   . Worried About Charity fundraiser in the Last Year:   . Arboriculturist in the Last Year:   Transportation Needs:   . Film/video editor (Medical):   [name] Lack of Transportation (Non-Medical):   Physical Activity:   . Days of Exercise per Week:   . Minutes of Exercise per Session:   Stress:   . Feeling of Stress :   Social Connections:   . Frequency of Communication with Friends and Family:   . Frequency of Social Gatherings with Friends and Family:   . Attends Religious Services:   . Active Member of Clubs or Organizations:   . Attends Archivist Meetings:   [name] Marital Status:   Intimate Partner Violence:   . Fear of Current or Ex-Partner:   . Emotionally Abused:   [name] Physically Abused:   . Sexually Abused:     Past Surgical History:  Procedure Laterality Date  . AMPUTATION TOE Right 02/20/2020   Procedure: AMPUTATION RIGHT GREAT  TOE;  Surgeon: Felipa Furnace, DPM;  Location: [facility];  Service: Podiatry;  Laterality: Right;  . BLADDER SURGERY    . CARDIAC CATHETERIZATION    . CORONARY ARTERY BYPASS GRAFT    . INCISION AND DRAINAGE Right 02/19/2020   Procedure: INCISION AND DRAINAGE;  Surgeon: Trula Slade, DPM;  Location: [facility];  Service: Podiatry;  Laterality: Right;  Block done by surgeon  . TUBAL LIGATION      Family History  Problem Relation Age of Onset  . Hypertension Mother   . Hyperlipidemia Mother   . Heart attack Father   . Heart disease Father   . Hypertension Father   . Alzheimer's disease Father   . Hypertension Brother   . Hyperlipidemia Brother      REVIEW OF SYSTEMS: Noncontributory  PHYSICAL EXAMINATION: There were no vitals filed for this visit.  GENERAL: Well-developed, well-nourished, in no acute distress. Alert and cooperative.  LOWER EXTREMITY EXAM: Dressing clean and dry and intact to the right foot this AM  Previous exam findings:  Right foot: At  previous amp stump site central dehiscence at surgical site measures  2.0x 0.5 x 0.5 cm post debridement with fatty tissue and met head exposed, there is no maceration, increased swelling to the amputation stump site, increased blanchable erythema, no significant mall odor, no warmth, no other signs of infection noted, Capillary fill time <5 seconds in all lesser digits, protective sensation severely diminished.  No pain or crepitation with range of motion right foot.  No pain with calf compression.   XRAY, RIGHT FOOT- in chart  ASSESSMENTS:  1.Wound dehiscence at right foot amputation stump site 2. Diabetic foot ulcer 3. History of right hallux amputation 4. Right foot pain  5. Diabetes Mellitus with  neuropathy  PLAN OF CARE: Patient seen and evaluated 1. History and physical completed 2. Patient NPO since midnight 3. Previous Imaging reviewed  4. Consent for surgery explained and obtained for revision of amputation stump site by removal of 1st met head and primary closure; risk and benefits explained; all questions answered and no guarantees granted. 5. Patient to undergo above surgical procedure 6. Case discussed with patient and to meet with family represented after the procedure 7. To resume all home meds post-procedure and to give prn pain meds, anti-nausea, and anti-constipation medications post op 8. Will continue to follow closely/ see post-operative in the office within 1 week.  Landis Martins, DPM

## 2020-05-02 NOTE — Anesthesia Postprocedure Evaluation (Signed)
Anesthesia Post Note  Patient: Jasmine Richardson  Procedure(s) Performed: FIRST METATARSAL HEAD RESECTION; RIGHT FOOT WOUND CLOSURE (Right Toe)     Patient location during evaluation: PACU Anesthesia Type: MAC Level of consciousness: awake and alert Pain management: pain level controlled Vital Signs Assessment: post-procedure vital signs reviewed and stable Respiratory status: spontaneous breathing, nonlabored ventilation and respiratory function stable Cardiovascular status: blood pressure returned to baseline and stable Postop Assessment: no apparent nausea or vomiting Anesthetic complications: no    Last Vitals:  Vitals:   05/02/20 0904 05/02/20 0913  BP:  136/84  Pulse: 66 69  Resp: (!) 21 20  Temp:  36.6 C  SpO2: 94% 98%    Last Pain:  Vitals:   05/02/20 0913  TempSrc:   PainSc: 0-No pain        RLE Motor Response: Purposeful movement (05/02/20 0913) RLE Sensation: Numbness;Tingling (05/02/20 0913)      Pervis Hocking

## 2020-05-02 NOTE — Addendum Note (Signed)
Addendum  created 05/02/20 1046 by Pervis Hocking, DO   Clinical Note Signed

## 2020-05-02 NOTE — Transfer of Care (Signed)
Immediate Anesthesia Transfer of Care Note  Patient: Jasmine Richardson  Procedure(s) Performed: FIRST METATARSAL HEAD RESECTION; RIGHT FOOT WOUND CLOSURE (Right Toe)  Patient Location: PACU  Anesthesia Type:MAC  Level of Consciousness: awake, alert , oriented and patient cooperative  Airway & Oxygen Therapy: Patient Spontanous Breathing and Patient connected to face mask oxygen  Post-op Assessment: Report given to RN and Post -op Vital signs reviewed and stable  Post vital signs: Reviewed and stable  Last Vitals:  Vitals Value Taken Time  BP    Temp    Pulse 68 05/02/20 0830  Resp    SpO2 100 % 05/02/20 0830  Vitals shown include unvalidated device data.  Last Pain:  Vitals:   05/02/20 0659  TempSrc: Tympanic  PainSc: 0-No pain         Complications: No apparent anesthesia complications

## 2020-05-02 NOTE — Op Note (Signed)
DATE: 05-02-20  SURGEON: Landis Martins, DPM  PREOPERATIVE DIAGNOSIS:  Right exposed 1st metatarsal head with wound dehiscence at previous hallux amputation stump site, cellulitis, osteomyelitis    POSTOPERATIVE DIAGNOSIS: Same   PROCEDURE PERFORMED: right distal 1st metatarsal head resection and wound closure  HEMOSTASIS:  right ankle tourniquet  ESTIMATED BLOOD LOSS:  Minimal  ANESTHESIA:  MAC with local 20cc of 1:1 mixture 1% lidocaine and 0.5% marcaine plain  SPECIMENS: Bone and soft tissue- path, wound swab- micro  COMPLICATIONS:  None.   INDICATIONS FOR PROCEDURE:  This patient is a pleasant 59 y.o. diabetic female who has been seen in office for nonhealing amputation stump site with wound dehiscence.  Good local wound care and resuturing was attempted in office but area failed to heal.  Patient elects for removal of the metatarsal head and closure of the open wound at the previous right hallux amputation stump site (revision).  Risks and complications include but are not limited to infection, recurrence of symptoms, pain, numbness, wound dehiscence, delayed healing, as well as need for future surgery/further amputation.  No guarantees were given or applied.  All questions were answered to the patient's satisfaction, and the patient has consented to the above procedure.  All preoperative labs and H&P, medical clearances have been obtained and NPO status past midnight has been confirmed.    PREPARATION FOR PROCEDURE:  The patient was brought to the operating room and placed on the operating table in supine position.  A pneumatic ankle tourniquet was placed about the patient's right foot but not yet inflated.  After the department of anesthesia had administered MAC anesthesia. A local block was administered and the right foot was then scrubbed, prepped, and draped in the usual aseptic manner.  An Esmarch bandage was utilized to exsanguinate the patient's right foot and leg, and the  pneumatic tourniquet was inflated to 250 mmHg.  PROCEDURE IN DETAIL:  Attention was then directed to the dorsomedial aspect of the patient's right foot where a racquet-type incision was made, extending distally, looping around the pre-existing wound at the first metatarsal phalangeal joint. The incision was made in the routine fashion, single layer down to the level of bone. When doing so, there was no purulence noted at the incision site. The incision was carried deep down to the level of the first metatarsophalangeal head, capsule was reflected revealing soft and fragmented bone of the 1st met.  The first metatarsal head was inspected and resected at level of surgical neck and  Sesamoids removed. Any remaining devitalizing  or infected tissue was debrided as necessary. The extensor and flexor tendon was dissected as proximally as visible and transected at that level. The operative site was then irrigated utilizing a pulse lavage, 3 liters of normal sterile saline. A small tissue culture from the remaining tissue flaps was then taken to be sent for culture and sensitivity analysis. Skin closure was then obtained utilizing 2-0 vicryl and 3-0 nylon in combination of simple interrupted suture fashion.  Staples were also applied to assist with wound closure.  The right foot was then dressed with Betadine soaked gauze overlying the suture sites, 4 x 4 gauze, abd, Kerlix, Coban and ACE wrap.  At this time, the right pneumatic tourniquet was deflated, and a positive hyperemic response was noted to the lesser digits. The patient tolerated the procedure and anesthesia well.  Upon transfer to the recovery room, the patient's vital signs were stable, and neurovascular status was intact.    Postoperative prescriptions  and instructions were written and given to the patient who will return to the office of Dr. Cannon Kettle in 1 week for continued care and management of this patient.  Landis Martins, DPM

## 2020-05-03 ENCOUNTER — Telehealth: Payer: Self-pay | Admitting: Sports Medicine

## 2020-05-03 LAB — SURGICAL PATHOLOGY

## 2020-05-03 NOTE — Telephone Encounter (Signed)
Post op phone call made to patient. Patient reports that she is doing good. Has a little bit of swelling. Reports no pain, SOB, or chest pain. I advised patient to elevate and use ice pack behind the knee to help with swelling. I advised patient to call office Thursday morning and if she is doing good may not need appt for this week.  -Dr. Cannon Kettle

## 2020-05-04 ENCOUNTER — Encounter: Payer: Self-pay | Admitting: *Deleted

## 2020-05-05 ENCOUNTER — Ambulatory Visit (INDEPENDENT_AMBULATORY_CARE_PROVIDER_SITE_OTHER): Payer: Commercial Managed Care - PPO | Admitting: Sports Medicine

## 2020-05-05 ENCOUNTER — Other Ambulatory Visit: Payer: Self-pay

## 2020-05-05 DIAGNOSIS — E1165 Type 2 diabetes mellitus with hyperglycemia: Secondary | ICD-10-CM

## 2020-05-05 DIAGNOSIS — Z9889 Other specified postprocedural states: Secondary | ICD-10-CM

## 2020-05-05 DIAGNOSIS — Z89419 Acquired absence of unspecified great toe: Secondary | ICD-10-CM

## 2020-05-05 NOTE — Progress Notes (Signed)
Subjective: Jasmine Richardson is a 59 y.o. female patient seen today in office for POV #1 (DOS 05/02/2020), S/P revision of amputation stump site right foot with removal of additional bone at the first metatarsal head and primary wound closure.  Patient admits to a little bit of pain 4 out of 10 worse at night and a little bit this morning with some swelling of the leg has been consistent with wearing surgical shoe taking her antibiotic and Tylenol. No other issues noted.   Patient is assisted by husband this visit.  Fasting blood sugar not recorded this visit.  Patient Active Problem List   Diagnosis Date Noted  . Diabetic foot ulcer ([place]) 02/16/2020  . Angina pectoris ([place]) 09/11/2017  . Emphysema lung ([place]) 09/11/2017  . Chronic diastolic heart failure ([place]) 11/06/2016  . Mediastinitis 06/28/2016  . Wound, surgical, infected 06/06/2016  . S/P CABG (coronary artery bypass graft) 06/03/2016  . GERD (gastroesophageal reflux disease) 05/08/2016  . Morbid obesity ([place]) 05/08/2016  . Uncontrolled type 2 diabetes mellitus ([place]) 05/08/2016  . Tobacco use disorder 04/20/2016  . Coronary artery disease involving native coronary artery of native heart with angina pectoris ([place]) 09/12/2015  . Essential hypertension 09/12/2015  . Hyperlipidemia 09/12/2015    Current Outpatient Medications on File Prior to Visit  Medication Sig Dispense Refill  . acetaminophen (TYLENOL) 500 MG tablet Take 2 tablets (1,000 mg total) by mouth every 6 (six) hours as needed. 100 tablet 2  . aspirin 81 MG chewable tablet Chew by mouth.    . clopidogrel (PLAVIX) 75 MG tablet Take 1 tablet (75 mg total) by mouth daily. 30 tablet 10  . Cyanocobalamin (VITAMIN B 12 PO) Take 1 tablet by mouth daily.    [name] docusate sodium (COLACE) 100 MG capsule Take 1 capsule (100 mg total) by mouth daily as needed. 30 capsule 2  . furosemide (LASIX) 40 MG tablet Take 1 tablet by mouth once daily (Patient taking differently: Take 40 mg by mouth  daily. ) 90 tablet 2  . glipiZIDE (GLUCOTROL) 10 MG tablet Take 10 mg by mouth 2 (two) times daily.    . Insulin Glargine (BASAGLAR KWIKPEN) 100 UNIT/ML Inject 0.2 mLs (20 Units total) into the skin at bedtime. 15 mL 0  . JANUVIA 100 MG tablet Take 100 mg by mouth daily.    [name] LORazepam (ATIVAN) 1 MG tablet Take 1 mg by mouth as needed for anxiety.    . metFORMIN (GLUCOPHAGE-XR) 500 MG 24 hr tablet Take 1,000 mg by mouth 2 (two) times daily.    . metoprolol succinate (TOPROL-XL) 50 MG 24 hr tablet Take 150 mg by mouth daily.     . nitroGLYCERIN (NITROSTAT) 0.4 MG SL tablet Place 1 tablet (0.4 mg total) under the tongue every 5 (five) minutes x 3 doses as needed for chest pain. 30 tablet 11  . omeprazole (PRILOSEC) 20 MG capsule Take 1 capsule (20 mg total) by mouth daily. 90 capsule 1  . pravastatin (PRAVACHOL) 20 MG tablet TAKE 1 TABLET BY MOUTH ONCE DAILY IN THE EVENING (Patient taking differently: Take 20 mg by mouth every evening. ) 90 tablet 2  . promethazine (PHENERGAN) 12.5 MG tablet Take 1 tablet (12.5 mg total) by mouth every 6 (six) hours as needed for nausea or vomiting. 30 tablet 0  . ramipril (ALTACE) 1.25 MG capsule Take 1 capsule (1.25 mg total) by mouth 2 (two) times daily. 60 capsule 3  . sulfamethoxazole-trimethoprim (BACTRIM) 400-80 MG tablet Take 1 tablet  by mouth 2 (two) times daily. 28 tablet 0  . vitamin C (ASCORBIC ACID) 250 MG tablet Take 250 mg by mouth daily.    . Vitamin D, Ergocalciferol, (DRISDOL) 50000 units CAPS capsule Take 50,000 Units by mouth every 7 (seven) days. Saturdays     No current facility-administered medications on file prior to visit.    Allergies  Allergen Reactions  . Tramadol   . Codeine Rash    Objective: There were no vitals filed for this visit.  General: No acute distress, AAOx3  Right foot: Sutures and staples intact with no gapping or dehiscence at surgical site, mild swelling to the amputation stump site, blanchable erythema, no  warmth, no other signs of infection noted, Capillary fill time <5 seconds in all lesser digits, protective sensation severely diminished.  No pain or crepitation with range of motion right foot.  No pain with calf compression.   Assessment and Plan:  Problem List Items Addressed This Visit      Endocrine   Uncontrolled type 2 diabetes mellitus ([place])    Other Visit Diagnoses    S/P foot surgery, right    -  Primary   History of amputation of hallux ([place])           -Patient seen and evaluated -Applied Betadine and dry sterile dressing to surgical site right foot secured with ACE wrap and stockinet  -Advised patient to make sure to keep dressings clean, dry, and intact to right foot surgical site  -Advised patient to limit activity to necessity and to continue with postoperative shoe and cane like previous -Advised patient to elevate as necessary  -Advised patient to limit activity -Continue with Bactrim and Tylenol as needed for pain -Return to office as scheduled next week for postoperative x-rays and continued postoperative care Landis Martins, DPM

## 2020-05-07 LAB — AEROBIC/ANAEROBIC CULTURE W GRAM STAIN (SURGICAL/DEEP WOUND)

## 2020-05-11 ENCOUNTER — Ambulatory Visit: Payer: Commercial Managed Care - PPO | Admitting: Cardiology

## 2020-05-12 ENCOUNTER — Encounter: Payer: Commercial Managed Care - PPO | Admitting: Sports Medicine

## 2020-05-13 ENCOUNTER — Ambulatory Visit (INDEPENDENT_AMBULATORY_CARE_PROVIDER_SITE_OTHER): Payer: Commercial Managed Care - PPO

## 2020-05-13 ENCOUNTER — Other Ambulatory Visit: Payer: Self-pay

## 2020-05-13 ENCOUNTER — Encounter: Payer: Self-pay | Admitting: Cardiology

## 2020-05-13 ENCOUNTER — Ambulatory Visit (INDEPENDENT_AMBULATORY_CARE_PROVIDER_SITE_OTHER): Payer: Commercial Managed Care - PPO | Admitting: Sports Medicine

## 2020-05-13 ENCOUNTER — Ambulatory Visit: Payer: Commercial Managed Care - PPO | Admitting: Cardiology

## 2020-05-13 ENCOUNTER — Other Ambulatory Visit: Payer: Self-pay | Admitting: Sports Medicine

## 2020-05-13 ENCOUNTER — Encounter: Payer: Self-pay | Admitting: Sports Medicine

## 2020-05-13 VITALS — BP 130/86 | HR 85 | Ht 67.0 in | Wt 229.0 lb

## 2020-05-13 DIAGNOSIS — Z89419 Acquired absence of unspecified great toe: Secondary | ICD-10-CM

## 2020-05-13 DIAGNOSIS — I110 Hypertensive heart disease with heart failure: Secondary | ICD-10-CM | POA: Diagnosis not present

## 2020-05-13 DIAGNOSIS — E1165 Type 2 diabetes mellitus with hyperglycemia: Secondary | ICD-10-CM | POA: Diagnosis not present

## 2020-05-13 DIAGNOSIS — E782 Mixed hyperlipidemia: Secondary | ICD-10-CM

## 2020-05-13 DIAGNOSIS — I5032 Chronic diastolic (congestive) heart failure: Secondary | ICD-10-CM

## 2020-05-13 DIAGNOSIS — Z9889 Other specified postprocedural states: Secondary | ICD-10-CM

## 2020-05-13 DIAGNOSIS — M79671 Pain in right foot: Secondary | ICD-10-CM

## 2020-05-13 DIAGNOSIS — I25119 Atherosclerotic heart disease of native coronary artery with unspecified angina pectoris: Secondary | ICD-10-CM | POA: Diagnosis not present

## 2020-05-13 MED ORDER — ASPIRIN EC 81 MG PO TBEC: 81.0000 mg | DELAYED_RELEASE_TABLET | Freq: Every day | ORAL | 3 refills | Status: DC

## 2020-05-13 NOTE — Progress Notes (Signed)
Subjective: Jasmine Richardson is a 59 y.o. female patient seen today in office for POV #2 (DOS 05/02/2020), S/P revision of amputation stump site right foot with removal of additional bone at the first metatarsal head and primary wound closure.  Patient admits to a little bit of pain 2-3 out of 10 not bad mostly noticeable at nighttime with foot swelling.  Has been taking Tylenol using her cane and surgical shoe and elevating. No other issues noted.   Fasting blood sugar not recorded this visit.  Patient Active Problem List   Diagnosis Date Noted  . Diabetic foot ulcer ([place]) 02/16/2020  . Angina pectoris ([place]) 09/11/2017  . Emphysema lung ([place]) 09/11/2017  . Chronic diastolic heart failure ([place]) 11/06/2016  . Mediastinitis 06/28/2016  . Wound, surgical, infected 06/06/2016  . S/P CABG (coronary artery bypass graft) 06/03/2016  . GERD (gastroesophageal reflux disease) 05/08/2016  . Morbid obesity ([place]) 05/08/2016  . Uncontrolled type 2 diabetes mellitus ([place]) 05/08/2016  . Tobacco use disorder 04/20/2016  . Coronary artery disease involving native coronary artery of native heart with angina pectoris ([place]) 09/12/2015  . Essential hypertension 09/12/2015  . Hyperlipidemia 09/12/2015    Current Outpatient Medications on File Prior to Visit  Medication Sig Dispense Refill  . acetaminophen (TYLENOL) 500 MG tablet Take 2 tablets (1,000 mg total) by mouth every 6 (six) hours as needed. 100 tablet 2  . aspirin EC 81 MG tablet Take 1 tablet (81 mg total) by mouth daily. 90 tablet 3  . clopidogrel (PLAVIX) 75 MG tablet Take 1 tablet (75 mg total) by mouth daily. 30 tablet 10  . Cyanocobalamin (VITAMIN B 12 PO) Take 1 tablet by mouth daily.    [name] docusate sodium (COLACE) 100 MG capsule Take 1 capsule (100 mg total) by mouth daily as needed. 30 capsule 2  . furosemide (LASIX) 40 MG tablet Take 1 tablet by mouth once daily (Patient taking differently: Take 40 mg by mouth daily. ) 90 tablet 2  . glipiZIDE  (GLUCOTROL) 10 MG tablet Take 10 mg by mouth 2 (two) times daily.    . Insulin Glargine (BASAGLAR KWIKPEN) 100 UNIT/ML Inject 0.2 mLs (20 Units total) into the skin at bedtime. 15 mL 0  . JANUVIA 100 MG tablet Take 100 mg by mouth daily.    [name] LORazepam (ATIVAN) 1 MG tablet Take 1 mg by mouth as needed for anxiety.    . metFORMIN (GLUCOPHAGE-XR) 500 MG 24 hr tablet Take 1,000 mg by mouth 2 (two) times daily.    . metoprolol succinate (TOPROL-XL) 50 MG 24 hr tablet Take 150 mg by mouth daily.     . nitroGLYCERIN (NITROSTAT) 0.4 MG SL tablet Place 1 tablet (0.4 mg total) under the tongue every 5 (five) minutes x 3 doses as needed for chest pain. (Patient not taking: Reported on 05/13/2020) 30 tablet 11  . omeprazole (PRILOSEC) 20 MG capsule Take 1 capsule (20 mg total) by mouth daily. 90 capsule 1  . pravastatin (PRAVACHOL) 20 MG tablet TAKE 1 TABLET BY MOUTH ONCE DAILY IN THE EVENING (Patient taking differently: Take 20 mg by mouth every evening. ) 90 tablet 2  . promethazine (PHENERGAN) 12.5 MG tablet Take 1 tablet (12.5 mg total) by mouth every 6 (six) hours as needed for nausea or vomiting. (Patient not taking: Reported on 05/13/2020) 30 tablet 0  . ramipril (ALTACE) 1.25 MG capsule Take 1 capsule (1.25 mg total) by mouth 2 (two) times daily. 60 capsule 3  . sulfamethoxazole-trimethoprim (BACTRIM)  400-80 MG tablet Take 1 tablet by mouth 2 (two) times daily. 28 tablet 0  . vitamin C (ASCORBIC ACID) 250 MG tablet Take 250 mg by mouth daily.    . Vitamin D, Ergocalciferol, (DRISDOL) 50000 units CAPS capsule Take 50,000 Units by mouth every 7 (seven) days. Saturdays     No current facility-administered medications on file prior to visit.    Allergies  Allergen Reactions  . Tramadol   . Codeine Rash    Objective: There were no vitals filed for this visit.  General: No acute distress, AAOx3  Right foot: Sutures and staples intact with no gapping or dehiscence at surgical site, mild swelling to  the amputation stump site, decreased blanchable erythema, no warmth, no other signs of infection noted, Capillary fill time <5 seconds in all lesser digits, protective sensation severely diminished.  No pain or crepitation with range of motion right foot.  No pain with calf compression.  X-rays consistent with amputation status  Assessment and Plan:  Problem List Items Addressed This Visit      Endocrine   Uncontrolled type 2 diabetes mellitus ([place])    Other Visit Diagnoses    S/P foot surgery, right    -  Primary   History of amputation of hallux ([place])           -Patient seen and evaluated -X-rays reviewed -Applied Betadine and dry sterile dressing to surgical site right foot secured with ACE wrap and stockinet  -Advised patient to make sure to keep dressings clean, dry, and intact to right foot surgical site  -Advised patient to limit activity to necessity and to continue with postoperative wedge shoe and cane like previous -Advised patient to elevate as necessary  -Advised patient to limit activity -Continue with Bactrim until completed and Tylenol as needed for pain -Return to office next week for possible suture removal or sooner if problems or issues arise. Landis Martins, DPM

## 2020-05-13 NOTE — Progress Notes (Signed)
Cardiology Office Note:    Date:  05/13/2020   ID:  Jasmine Richardson, DOB April 04, 1961, MRN [id number]  PCP:  Ernestene Kiel, MD  Cardiologist:  Shirlee More, MD    Referring MD: Ernestene Kiel, MD    ASSESSMENT:    1. Coronary artery disease involving native coronary artery of native heart with angina pectoris ([place])   2. Hypertensive heart disease with chronic diastolic congestive heart failure ([place])   3. Mixed hyperlipidemia   4. Uncontrolled type 2 diabetes mellitus with hyperglycemia (HCC)    PLAN:    In order of problems listed above:  1. Last visit I had seen her in follow-up to hospitalization with a low risk myocardial perfusion study with questionable mild peri-infarct ischemia.  Certainly at that time with an active diabetic foot infection I would not recommend revascularization her CAD is stable she is having no angina at this time I would not advise undergoing coronary angiography and continue her current treatment including long-term dual antiplatelet therapy beta-blocker and statin.  If she has residual severe elevation of LDL would benefit from PCSK9 therapy labs requested 2. Stable heart failure is compensated continue current loop diuretic 3. From her description improved working closely with her PCP she is in a high risk group and would benefit from achieving hemoglobin A1c less than 7.5   Next appointment: 6 months   Medication Adjustments/Labs and Tests Ordered: Current medicines are reviewed at length with the patient today.  Concerns regarding medicines are outlined above.  No orders of the defined types were placed in this encounter.  No orders of the defined types were placed in this encounter.   Chief Complaint  Patient presents with  . Follow-up    3 MO FU   . Coronary Artery Disease    History of Present Illness:    Jasmine Richardson is a 60 y.o. female with a hx of  CAD, S/P CABG 04/23/16 with post operative mediastinitis and reoperation at  [facility] with a myocutaneous pectoral flaps for an infected sternotomy after CABG , diastolic heart failure, T2 DM, hypertension and hyperlipidemia.  She was ast seen 02/12/2020 following admission to [facility]. She was admitted to [facility] 01/22/2020 and discharged the next day.  In that admission she underwent a myocardial perfusion study showed a small area of ischemia involving the anterior lateral wall.  She did not have acute coronary syndrome and was judged to be best treated medically.Official discharge summary reported her myocardial perfusion study has ejection fraction 55% hypokinesia fixed defect was present and possible mild peri-infarct ischemia.    At that time she had a diabetic foot infection.  She had a right hallux amputation performed 0 02/20/2020 Compliance with diet, lifestyle and medications: Yes She is recovering from her right total amputation.  She feels much better tells me her lipids are markedly improved and A1c in the range of 9 I requested a copy from her PCP office.  She is having no angina shortness of breath edema palpitation or syncope. Some difficulty with gait and uses a cane for balance Past Medical History:  Diagnosis Date  . Acute chest pain 05/08/2016  . Angina pectoris ([place]) 09/11/2017  . [place] 05/08/2016  . Chronic diastolic heart failure ([place]) 11/06/2016  . Coronary artery disease involving native coronary artery of native heart with angina pectoris ([place]) 09/12/2015   Overview:  PCI and stent of RCA 2009, last cath 2012 with medical therapy  CABG May 2017  . Emphysema lung (  [place]) 09/11/2017  . Essential hypertension 09/12/2015  . GERD (gastroesophageal reflux disease) 05/08/2016  . Hyperlipidemia 09/12/2015  . Mediastinitis 06/28/2016  . Morbid obesity ([place]) 05/08/2016  . S/P CABG (coronary artery bypass graft) 06/03/2016   Overview:  The patient underwent sternal reconstruction on 06/21/16 with pec flaps for mediastinitis from a prior CABG in May  2017. On admission, she was critically ill from sepsis and had altered mental status. She was last seen in clinic on 08/09/16 at which time she was doing well.  . Severe sepsis ([place]) 06/03/2016  . Sinus tachycardia 05/08/2016  . Tobacco use disorder 04/20/2016   Overview:  Quit in May 2017.  [name] Uncontrolled type 2 diabetes mellitus ([place]) 05/08/2016  . Wound, surgical, infected 06/06/2016   Overview:  sternal    Past Surgical History:  Procedure Laterality Date  . AMPUTATION TOE Right 02/20/2020   Procedure: AMPUTATION RIGHT GREAT  TOE;  Surgeon: Felipa Furnace, DPM;  Location: [place];  Service: Podiatry;  Laterality: Right;  . BLADDER SURGERY    . CARDIAC CATHETERIZATION    . CORONARY ARTERY BYPASS GRAFT    . INCISION AND DRAINAGE Right 02/19/2020   Procedure: INCISION AND DRAINAGE;  Surgeon: Trula Slade, DPM;  Location: [place];  Service: Podiatry;  Laterality: Right;  Block done by surgeon  . METATARSAL HEAD EXCISION Right 05/02/2020   Procedure: FIRST METATARSAL HEAD RESECTION; RIGHT FOOT WOUND CLOSURE;  Surgeon: Landis Martins, DPM;  Location: [place];  Service: Podiatry;  Laterality: Right;  MAC W/LOCAL  . TUBAL LIGATION      Current Medications: Current Meds  Medication Sig  . acetaminophen (TYLENOL) 500 MG tablet Take 2 tablets (1,000 mg total) by mouth every 6 (six) hours as needed.  . clopidogrel (PLAVIX) 75 MG tablet Take 1 tablet (75 mg total) by mouth daily.  . Cyanocobalamin (VITAMIN B 12 PO) Take 1 tablet by mouth daily.  [name] docusate sodium (COLACE) 100 MG capsule Take 1 capsule (100 mg total) by mouth daily as needed.  . furosemide (LASIX) 40 MG tablet Take 1 tablet by mouth once daily (Patient taking differently: Take 40 mg by mouth daily. )  . glipiZIDE (GLUCOTROL) 10 MG tablet Take 10 mg by mouth 2 (two) times daily.  . Insulin Glargine (BASAGLAR KWIKPEN) 100 UNIT/ML Inject 0.2 mLs (20 Units total) into the skin at bedtime.  [name] JANUVIA 100 MG tablet Take  100 mg by mouth daily.  . metFORMIN (GLUCOPHAGE-XR) 500 MG 24 hr tablet Take 1,000 mg by mouth 2 (two) times daily.  . metoprolol succinate (TOPROL-XL) 50 MG 24 hr tablet Take 150 mg by mouth daily.   [name] omeprazole (PRILOSEC) 20 MG capsule Take 1 capsule (20 mg total) by mouth daily.  . pravastatin (PRAVACHOL) 20 MG tablet TAKE 1 TABLET BY MOUTH ONCE DAILY IN THE EVENING (Patient taking differently: Take 20 mg by mouth every evening. )  . ramipril (ALTACE) 1.25 MG capsule Take 1 capsule (1.25 mg total) by mouth 2 (two) times daily.  [name] sulfamethoxazole-trimethoprim (BACTRIM) 400-80 MG tablet Take 1 tablet by mouth 2 (two) times daily.  . vitamin C (ASCORBIC ACID) 250 MG tablet Take 250 mg by mouth daily.  . Vitamin D, Ergocalciferol, (DRISDOL) 50000 units CAPS capsule Take 50,000 Units by mouth every 7 (seven) days. Saturdays  . [DISCONTINUED] aspirin 81 MG chewable tablet Chew by mouth.     Allergies:   Tramadol and Codeine   Social History   Socioeconomic  History  . Marital status: Married    Spouse name: Not on file  . Number of children: Not on file  . Years of education: Not on file  . Highest education level: Not on file  Occupational History  . Not on file  Tobacco Use  . Smoking status: Former Smoker    Quit date: 05/2016    Years since quitting: 3.9  . Smokeless tobacco: Never Used  Substance and Sexual Activity  . Alcohol use: No  . Drug use: No  . Sexual activity: Yes  Other Topics Concern  . Not on file  Social History Narrative  . Not on file   Social Determinants of Health   Financial Resource Strain:   . Difficulty of Paying Living Expenses:   Food Insecurity:   . Worried About Charity fundraiser in the Last Year:   . Arboriculturist in the Last Year:   Transportation Needs:   . Film/video editor (Medical):   [name] Lack of Transportation (Non-Medical):   Physical Activity:   . Days of Exercise per Week:   . Minutes of Exercise per Session:   Stress:     . Feeling of Stress :   Social Connections:   . Frequency of Communication with Friends and Family:   . Frequency of Social Gatherings with Friends and Family:   . Attends Religious Services:   . Active Member of Clubs or Organizations:   . Attends Archivist Meetings:   [name] Marital Status:      Family History: The patient's family history includes Alzheimer's disease in her father; Heart attack in her father; Heart disease in her father; Hyperlipidemia in her brother and mother; Hypertension in her brother, father, and mother. ROS:   Please see the history of present illness.    All other systems reviewed and are negative.  EKGs/Labs/Other Studies Reviewed:    The following studies were reviewed today:    Recent Labs: 02/16/2020: ALT 11 02/19/2020: Magnesium 1.6 02/20/2020: Hemoglobin 9.6; Platelets 416 02/21/2020: BUN 25; Creatinine, Ser 1.09; Potassium 3.7; Sodium 140  Recent Lipid Panel 04/01/2019: Cholesterol 407 triglycerides 1095 HDL 31 LDL unable to be measured A1c subsequently 02/16/2020 was 11.7.  Physical Exam:    VS:  BP 130/86   Pulse 85   Ht _0  (1.702 m)   Wt 229 lb (103.9 kg)   LMP  (LMP Unknown)   SpO2 96%   BMI 35.87 kg/m     Wt Readings from Last 3 Encounters:  05/13/20 229 lb (103.9 kg)  05/02/20 232 lb 5.8 oz (105.4 kg)  02/19/20 230 lb (104.3 kg)     GEN:  Well nourished, well developed in no acute distress HEENT: Normal NECK: No JVD; No carotid bruits LYMPHATICS: No lymphadenopathy CARDIAC: RRR, no murmurs, rubs, gallops RESPIRATORY:  Clear to auscultation without rales, wheezing or rhonchi  ABDOMEN: Soft, non-tender, non-distended MUSCULOSKELETAL:  No edema; No deformity  SKIN: Warm and dry NEUROLOGIC:  Alert and oriented x 3 PSYCHIATRIC:  Normal affect    Signed, Shirlee More, MD  05/13/2020 10:43 AM    [place]

## 2020-05-13 NOTE — Patient Instructions (Signed)

## 2020-05-18 ENCOUNTER — Encounter: Payer: Self-pay | Admitting: Sports Medicine

## 2020-05-18 ENCOUNTER — Other Ambulatory Visit: Payer: Self-pay

## 2020-05-18 ENCOUNTER — Ambulatory Visit (INDEPENDENT_AMBULATORY_CARE_PROVIDER_SITE_OTHER): Payer: Commercial Managed Care - PPO | Admitting: Sports Medicine

## 2020-05-18 DIAGNOSIS — Z9889 Other specified postprocedural states: Secondary | ICD-10-CM

## 2020-05-18 DIAGNOSIS — E1165 Type 2 diabetes mellitus with hyperglycemia: Secondary | ICD-10-CM

## 2020-05-18 DIAGNOSIS — Z89419 Acquired absence of unspecified great toe: Secondary | ICD-10-CM

## 2020-05-18 NOTE — Progress Notes (Signed)
Subjective: Jasmine Richardson is a 59 y.o. female patient seen today in office for POV # 3 (DOS 05/02/2020), S/P revision of amputation stump site right foot with removal of additional bone at the first metatarsal head and primary wound closure.  Patient admits to a little bit of pain 3 out of 10 mostly at the end of day with a little swelling.  Has been taking Tylenol using her cane and surgical shoe and elevating but does report that she has been a little more this weekend and reports that this probably is why she had a little bit of pain.  Reports one episode of nausea 2 nights ago thinks is related to the food that she had eaten but denies vomiting fever chills night sweats or any other constitutional symptoms at this time.  No other issues noted.   Fasting blood sugar not checked per patient.  Patient Active Problem List   Diagnosis Date Noted  . Diabetic foot ulcer ([place]) 02/16/2020  . Angina pectoris ([place]) 09/11/2017  . Emphysema lung ([place]) 09/11/2017  . Chronic diastolic heart failure ([place]) 11/06/2016  . Mediastinitis 06/28/2016  . Wound, surgical, infected 06/06/2016  . S/P CABG (coronary artery bypass graft) 06/03/2016  . GERD (gastroesophageal reflux disease) 05/08/2016  . Morbid obesity ([place]) 05/08/2016  . Uncontrolled type 2 diabetes mellitus ([place]) 05/08/2016  . Tobacco use disorder 04/20/2016  . Coronary artery disease involving native coronary artery of native heart with angina pectoris ([place]) 09/12/2015  . Essential hypertension 09/12/2015  . Hyperlipidemia 09/12/2015    Current Outpatient Medications on File Prior to Visit  Medication Sig Dispense Refill  . acetaminophen (TYLENOL) 500 MG tablet Take 2 tablets (1,000 mg total) by mouth every 6 (six) hours as needed. 100 tablet 2  . aspirin EC 81 MG tablet Take 1 tablet (81 mg total) by mouth daily. 90 tablet 3  . clopidogrel (PLAVIX) 75 MG tablet Take 1 tablet (75 mg total) by mouth daily. 30 tablet 10  . Cyanocobalamin (VITAMIN  B 12 PO) Take 1 tablet by mouth daily.    [name] docusate sodium (COLACE) 100 MG capsule Take 1 capsule (100 mg total) by mouth daily as needed. 30 capsule 2  . furosemide (LASIX) 40 MG tablet Take 1 tablet by mouth once daily (Patient taking differently: Take 40 mg by mouth daily. ) 90 tablet 2  . glipiZIDE (GLUCOTROL) 10 MG tablet Take 10 mg by mouth 2 (two) times daily.    . Insulin Glargine (BASAGLAR KWIKPEN) 100 UNIT/ML Inject 0.2 mLs (20 Units total) into the skin at bedtime. 15 mL 0  . JANUVIA 100 MG tablet Take 100 mg by mouth daily.    [name] LORazepam (ATIVAN) 1 MG tablet Take 1 mg by mouth as needed for anxiety.    . metFORMIN (GLUCOPHAGE-XR) 500 MG 24 hr tablet Take 1,000 mg by mouth 2 (two) times daily.    . metoprolol succinate (TOPROL-XL) 50 MG 24 hr tablet Take 150 mg by mouth daily.     . nitroGLYCERIN (NITROSTAT) 0.4 MG SL tablet Place 1 tablet (0.4 mg total) under the tongue every 5 (five) minutes x 3 doses as needed for chest pain. (Patient not taking: Reported on 05/13/2020) 30 tablet 11  . omeprazole (PRILOSEC) 20 MG capsule Take 1 capsule (20 mg total) by mouth daily. 90 capsule 1  . pravastatin (PRAVACHOL) 20 MG tablet TAKE 1 TABLET BY MOUTH ONCE DAILY IN THE EVENING (Patient taking differently: Take 20 mg by mouth every evening. )  90 tablet 2  . promethazine (PHENERGAN) 12.5 MG tablet Take 1 tablet (12.5 mg total) by mouth every 6 (six) hours as needed for nausea or vomiting. (Patient not taking: Reported on 05/13/2020) 30 tablet 0  . ramipril (ALTACE) 1.25 MG capsule Take 1 capsule (1.25 mg total) by mouth 2 (two) times daily. 60 capsule 3  . sulfamethoxazole-trimethoprim (BACTRIM) 400-80 MG tablet Take 1 tablet by mouth 2 (two) times daily. 28 tablet 0  . vitamin C (ASCORBIC ACID) 250 MG tablet Take 250 mg by mouth daily.    . Vitamin D, Ergocalciferol, (DRISDOL) 50000 units CAPS capsule Take 50,000 Units by mouth every 7 (seven) days. Saturdays     No current facility-administered  medications on file prior to visit.    Allergies  Allergen Reactions  . Tramadol   . Codeine Rash    Objective: There were no vitals filed for this visit.  General: No acute distress, AAOx3  Right foot: Sutures and staples intact with no gapping or dehiscence at surgical site, mild swelling to the amputation stump site, decreased blanchable erythema, no warmth, no other signs of infection noted, Capillary fill time <5 seconds in all lesser digits, protective sensation severely diminished.  No pain or crepitation with range of motion right foot.  No pain with calf compression.  Assessment and Plan:  Problem List Items Addressed This Visit      Endocrine   Uncontrolled type 2 diabetes mellitus ([place])    Other Visit Diagnoses    S/P foot surgery, right    -  Primary   History of amputation of hallux ([place])           -Patient seen and evaluated -Sutures were removed staples were left in place -Applied Betadine and dry sterile dressing to surgical site right foot secured with ACE wrap and stockinet  -Advised patient to make sure to keep dressings clean, dry, and intact to right foot surgical site  -Advised patient to limit activity to necessity and to continue with postoperative wedge shoe and cane like previous -Advised patient to elevate as necessary  -Advised patient to limit activity -Continue with Tylenol as needed for pain -Bactrim has been completed no refills needed at this time -Return to office next week for possible staple removal or sooner if problems or issues arise. Landis Martins, DPM

## 2020-05-22 ENCOUNTER — Inpatient Hospital Stay (HOSPITAL_COMMUNITY)
Admission: RE | Admit: 2020-05-22 | Discharge: 2020-05-26 | DRG: 247 | Disposition: A | Discharge status: Home / Self Care | Payer: Commercial Managed Care - PPO | Source: Other Acute Inpatient Hospital | Attending: Cardiovascular Disease | Admitting: Cardiovascular Disease

## 2020-05-22 ENCOUNTER — Other Ambulatory Visit: Payer: Self-pay

## 2020-05-22 DIAGNOSIS — E11621 Type 2 diabetes mellitus with foot ulcer: Secondary | ICD-10-CM | POA: Diagnosis present

## 2020-05-22 DIAGNOSIS — E119 Type 2 diabetes mellitus without complications: Secondary | ICD-10-CM | POA: Diagnosis present

## 2020-05-22 DIAGNOSIS — J439 Emphysema, unspecified: Secondary | ICD-10-CM | POA: Diagnosis present

## 2020-05-22 DIAGNOSIS — I200 Unstable angina: Secondary | ICD-10-CM

## 2020-05-22 DIAGNOSIS — I340 Nonrheumatic mitral (valve) insufficiency: Secondary | ICD-10-CM | POA: Diagnosis not present

## 2020-05-22 DIAGNOSIS — R079 Chest pain, unspecified: Secondary | ICD-10-CM | POA: Diagnosis present

## 2020-05-22 DIAGNOSIS — Z20822 Contact with and (suspected) exposure to covid-19: Secondary | ICD-10-CM | POA: Diagnosis present

## 2020-05-22 DIAGNOSIS — I5032 Chronic diastolic (congestive) heart failure: Secondary | ICD-10-CM | POA: Diagnosis present

## 2020-05-22 DIAGNOSIS — E781 Pure hyperglyceridemia: Secondary | ICD-10-CM | POA: Diagnosis present

## 2020-05-22 DIAGNOSIS — Z87891 Personal history of nicotine dependence: Secondary | ICD-10-CM | POA: Diagnosis not present

## 2020-05-22 DIAGNOSIS — Z8249 Family history of ischemic heart disease and other diseases of the circulatory system: Secondary | ICD-10-CM

## 2020-05-22 DIAGNOSIS — I25110 Atherosclerotic heart disease of native coronary artery with unstable angina pectoris: Secondary | ICD-10-CM | POA: Diagnosis present

## 2020-05-22 DIAGNOSIS — Z79899 Other long term (current) drug therapy: Secondary | ICD-10-CM | POA: Diagnosis not present

## 2020-05-22 DIAGNOSIS — IMO0002 Reserved for concepts with insufficient information to code with codable children: Secondary | ICD-10-CM | POA: Diagnosis present

## 2020-05-22 DIAGNOSIS — K219 Gastro-esophageal reflux disease without esophagitis: Secondary | ICD-10-CM | POA: Diagnosis present

## 2020-05-22 DIAGNOSIS — Z955 Presence of coronary angioplasty implant and graft: Secondary | ICD-10-CM

## 2020-05-22 DIAGNOSIS — Z7984 Long term (current) use of oral hypoglycemic drugs: Secondary | ICD-10-CM | POA: Diagnosis not present

## 2020-05-22 DIAGNOSIS — I10 Essential (primary) hypertension: Secondary | ICD-10-CM | POA: Diagnosis present

## 2020-05-22 DIAGNOSIS — Y831 Surgical operation with implant of artificial internal device as the cause of abnormal reaction of the patient, or of later complication, without mention of misadventure at the time of the procedure: Secondary | ICD-10-CM | POA: Diagnosis present

## 2020-05-22 DIAGNOSIS — Z885 Allergy status to narcotic agent status: Secondary | ICD-10-CM

## 2020-05-22 DIAGNOSIS — I110 Hypertensive heart disease with heart failure: Secondary | ICD-10-CM | POA: Diagnosis present

## 2020-05-22 DIAGNOSIS — E785 Hyperlipidemia, unspecified: Secondary | ICD-10-CM | POA: Diagnosis present

## 2020-05-22 DIAGNOSIS — Z7982 Long term (current) use of aspirin: Secondary | ICD-10-CM

## 2020-05-22 DIAGNOSIS — Z951 Presence of aortocoronary bypass graft: Secondary | ICD-10-CM

## 2020-05-22 DIAGNOSIS — I2510 Atherosclerotic heart disease of native coronary artery without angina pectoris: Secondary | ICD-10-CM | POA: Diagnosis not present

## 2020-05-22 DIAGNOSIS — Z820 Family history of epilepsy and other diseases of the nervous system: Secondary | ICD-10-CM | POA: Diagnosis not present

## 2020-05-22 DIAGNOSIS — I25810 Atherosclerosis of coronary artery bypass graft(s) without angina pectoris: Secondary | ICD-10-CM | POA: Diagnosis present

## 2020-05-22 DIAGNOSIS — Z7902 Long term (current) use of antithrombotics/antiplatelets: Secondary | ICD-10-CM

## 2020-05-22 DIAGNOSIS — I214 Non-ST elevation (NSTEMI) myocardial infarction: Secondary | ICD-10-CM

## 2020-05-22 DIAGNOSIS — E1165 Type 2 diabetes mellitus with hyperglycemia: Secondary | ICD-10-CM | POA: Diagnosis present

## 2020-05-22 DIAGNOSIS — I4510 Unspecified right bundle-branch block: Secondary | ICD-10-CM | POA: Diagnosis present

## 2020-05-22 HISTORY — DX: Unstable angina: I20.0

## 2020-05-22 HISTORY — DX: Non-ST elevation (NSTEMI) myocardial infarction: I21.4

## 2020-05-22 LAB — MAGNESIUM: Magnesium: 1 mg/dL — ABNORMAL LOW (ref 1.7–2.4)

## 2020-05-22 LAB — BASIC METABOLIC PANEL
Anion gap: 11 (ref 5–15)
BUN: 13 mg/dL (ref 6–20)
CO2: 23 mmol/L (ref 22–32)
Calcium: 8.7 mg/dL — ABNORMAL LOW (ref 8.9–10.3)
Chloride: 104 mmol/L (ref 98–111)
Creatinine, Ser: 0.75 mg/dL (ref 0.44–1.00)
GFR calc Af Amer: >60 mL/min (ref >60)
GFR calc non Af Amer: >60 mL/min (ref >60)
Glucose, Bld: 232 mg/dL — ABNORMAL HIGH (ref 70–99)
Potassium: 3.9 mmol/L (ref 3.5–5.1)
Sodium: 138 mmol/L (ref 135–145)

## 2020-05-22 LAB — CBC
HCT: 31.1 % — ABNORMAL LOW (ref 36.0–46.0)
Hemoglobin: 10.1 g/dL — ABNORMAL LOW (ref 12.0–15.0)
MCH: 28.1 pg (ref 26.0–34.0)
MCHC: 32.5 g/dL (ref 30.0–36.0)
MCV: 86.4 fL (ref 80.0–100.0)
Platelets: 271 10*3/uL (ref 150–400)
RBC: 3.6 MIL/uL — ABNORMAL LOW (ref 3.87–5.11)
RDW: 14 % (ref 11.5–15.5)
WBC: 8.8 10*3/uL (ref 4.0–10.5)
nRBC: 0 % (ref 0.0–0.2)

## 2020-05-22 LAB — HEMOGLOBIN A1C
Hgb A1c MFr Bld: 10.1 % — ABNORMAL HIGH (ref 4.8–5.6)
Mean Plasma Glucose: 243.17 mg/dL

## 2020-05-22 LAB — PROTIME-INR
INR: 1.1 (ref 0.8–1.2)
Prothrombin Time: 13.7 seconds (ref 11.4–15.2)

## 2020-05-22 LAB — TSH: TSH: 0.961 u[IU]/mL (ref 0.350–4.500)

## 2020-05-22 LAB — LIPID PANEL
Cholesterol: 208 mg/dL — ABNORMAL HIGH (ref 0–200)
HDL: 37 mg/dL — ABNORMAL LOW (ref >40)
LDL Cholesterol: UNDETERMINED mg/dL (ref 0–99)
Total CHOL/HDL Ratio: 5.6 RATIO
Triglycerides: 559 mg/dL — ABNORMAL HIGH (ref <150)
VLDL: UNDETERMINED mg/dL (ref 0–40)

## 2020-05-22 LAB — SARS CORONAVIRUS 2 BY RT PCR (HOSPITAL ORDER, PERFORMED IN ~~LOC~~ HOSPITAL LAB): SARS Coronavirus 2: NEGATIVE

## 2020-05-22 LAB — TROPONIN I (HIGH SENSITIVITY): Troponin I (High Sensitivity): 619 ng/L (ref <18)

## 2020-05-22 LAB — APTT: aPTT: 31 seconds (ref 24–36)

## 2020-05-22 MED ORDER — CLOPIDOGREL BISULFATE 75 MG PO TABS
75.0000 mg | ORAL_TABLET | Freq: Every day | ORAL | Status: DC
  Administered 2020-05-23 – 2020-05-26 (×4): 75 mg via ORAL
  Filled 2020-05-22 (×4): qty 1

## 2020-05-22 MED ORDER — NITROGLYCERIN 0.4 MG SL SUBL: 0.4000 mg | SUBLINGUAL_TABLET | SUBLINGUAL | Status: DC | PRN

## 2020-05-22 MED ORDER — FUROSEMIDE 40 MG PO TABS
40.0000 mg | ORAL_TABLET | Freq: Every day | ORAL | Status: DC
  Administered 2020-05-23 – 2020-05-26 (×4): 40 mg via ORAL
  Filled 2020-05-22 (×3): qty 1
  Filled 2020-05-22: qty 2

## 2020-05-22 MED ORDER — PANTOPRAZOLE SODIUM 40 MG PO TBEC
40.0000 mg | DELAYED_RELEASE_TABLET | Freq: Every day | ORAL | Status: DC
  Administered 2020-05-23 – 2020-05-26 (×4): 40 mg via ORAL
  Filled 2020-05-22 (×4): qty 1

## 2020-05-22 MED ORDER — ASPIRIN EC 81 MG PO TBEC
81.0000 mg | DELAYED_RELEASE_TABLET | Freq: Every day | ORAL | Status: DC
  Administered 2020-05-23 – 2020-05-26 (×3): 81 mg via ORAL
  Filled 2020-05-22 (×4): qty 1

## 2020-05-22 MED ORDER — ASPIRIN EC 81 MG PO TBEC: 81.0000 mg | DELAYED_RELEASE_TABLET | Freq: Every day | ORAL | Status: DC

## 2020-05-22 MED ORDER — INSULIN GLARGINE 100 UNIT/ML ~~LOC~~ SOLN
20.0000 [IU] | Freq: Every day | SUBCUTANEOUS | Status: DC
  Administered 2020-05-23 – 2020-05-24 (×3): 20 [IU] via SUBCUTANEOUS
  Filled 2020-05-22 (×4): qty 0.2

## 2020-05-22 MED ORDER — RAMIPRIL 1.25 MG PO CAPS
1.2500 mg | ORAL_CAPSULE | Freq: Two times a day (BID) | ORAL | Status: DC
  Administered 2020-05-23 – 2020-05-24 (×5): 1.25 mg via ORAL
  Filled 2020-05-22 (×7): qty 1

## 2020-05-22 MED ORDER — BASAGLAR KWIKPEN 100 UNIT/ML ~~LOC~~ SOPN: 20.0000 [IU] | PEN_INJECTOR | Freq: Every day | SUBCUTANEOUS | Status: DC

## 2020-05-22 MED ORDER — ACETAMINOPHEN 325 MG PO TABS
650.0000 mg | ORAL_TABLET | ORAL | Status: DC | PRN
  Administered 2020-05-23 – 2020-05-25 (×4): 650 mg via ORAL
  Filled 2020-05-22 (×4): qty 2

## 2020-05-22 MED ORDER — HEPARIN (PORCINE) 25000 UT/250ML-% IV SOLN
1350.0000 [IU]/h | INTRAVENOUS | Status: DC
  Administered 2020-05-23: 1200 [IU]/h via INTRAVENOUS
  Filled 2020-05-22: qty 250

## 2020-05-22 MED ORDER — PRAVASTATIN SODIUM 10 MG PO TABS
20.0000 mg | ORAL_TABLET | Freq: Every evening | ORAL | Status: DC
  Administered 2020-05-23: 20 mg via ORAL
  Filled 2020-05-22: qty 2

## 2020-05-22 MED ORDER — METOPROLOL SUCCINATE ER 50 MG PO TB24
150.0000 mg | ORAL_TABLET | Freq: Every day | ORAL | Status: DC
  Administered 2020-05-23 – 2020-05-26 (×4): 150 mg via ORAL
  Filled 2020-05-22: qty 1
  Filled 2020-05-22: qty 2
  Filled 2020-05-22 (×2): qty 1

## 2020-05-22 MED ORDER — INSULIN ASPART 100 UNIT/ML ~~LOC~~ SOLN
0.0000 [IU] | Freq: Three times a day (TID) | SUBCUTANEOUS | Status: DC
  Administered 2020-05-23: 5 [IU] via SUBCUTANEOUS
  Administered 2020-05-23: 2 [IU] via SUBCUTANEOUS
  Administered 2020-05-23: 3 [IU] via SUBCUTANEOUS
  Administered 2020-05-24 (×2): 5 [IU] via SUBCUTANEOUS
  Administered 2020-05-24 – 2020-05-25 (×2): 3 [IU] via SUBCUTANEOUS
  Administered 2020-05-25 (×2): 5 [IU] via SUBCUTANEOUS
  Administered 2020-05-26: 3 [IU] via SUBCUTANEOUS

## 2020-05-22 MED ORDER — HEPARIN BOLUS VIA INFUSION
4000.0000 [IU] | Freq: Once | INTRAVENOUS | Status: AC
  Administered 2020-05-23: 4000 [IU] via INTRAVENOUS
  Filled 2020-05-22: qty 4000

## 2020-05-22 MED ORDER — ONDANSETRON HCL 4 MG/2ML IJ SOLN: 4.0000 mg | Freq: Four times a day (QID) | INTRAMUSCULAR | Status: DC | PRN

## 2020-05-22 NOTE — ED Notes (Signed)
Dinner ordered 

## 2020-05-22 NOTE — H&P (Addendum)
Cardiology History & Physical    [name] MRN: [id number]; DOB: Mar 10, 1961   Admission date: 05/22/2020  Primary Care Provider: Ernestene Kiel, MD Primary Cardiologist: No primary care provider on file.  Primary Electrophysiologist:  None   Chief Complaint:  Chest pain  Patient Profile:   Jasmine Richardson is a 59 y.o. female with history of CAD status post CABGx4 in 2017 (LIMA to LAD, SVG to OM1, diag, and PDA) c/b mediastinitis requiring reoperation, PCI to RCA in 2009, diabetes, hypertension, diastolic heart failure, hyperlipidemia, who presents with chest pain.  History of Present Illness:   Patient had acute onset chest pain today around noon.  Describes it as midsternal crushing chest pain.  No other symptoms.  Denies dyspnea, nausea, diaphoresis.  Went to [facility] ED. initial EKG without evidence of ischemia.  Troponin resulted 0.2.  Repeat right-sided EKG was interpreted by ED as possible STEMI, and patient was transferred to [facility] health.  In route patient was given IV heparin and 2 doses of morphine.  On arrival to [facility] patient was symptom-free.  EKGs were reviewed and felt to not be consistent with STEMI.  Given this and resolution of symptoms, catheterization was deferred patient was admitted to cardiology.  Labs here Cr 0.75, Troponin 619.  A1c 10.1.  ECG with sinus rhythm, RBBB, and no evidence of ischemia.   Of note patient presented on 01/22/20 with chest pain to [facility].  Stress test demonstrated small area of ischemia of the anterolateral wall.  LV EF 55%.  Denies any recent illness including fever chills cough.  Recently underwent right partial foot amputation due to infection likely related to diabetes.  Heart Pathway Score:      Past Medical History:  Diagnosis Date  . Acute chest pain 05/08/2016  . Angina pectoris ([place]) 09/11/2017  . Burping 05/08/2016  . Chronic diastolic heart failure ([place]) 11/06/2016  . Coronary artery disease involving  native coronary artery of native heart with angina pectoris ([place]) 09/12/2015   Overview:  PCI and stent of RCA 2009, last cath 2012 with medical therapy  CABG May 2017  . Emphysema lung ([place]) 09/11/2017  . Essential hypertension 09/12/2015  . GERD (gastroesophageal reflux disease) 05/08/2016  . Hyperlipidemia 09/12/2015  . Mediastinitis 06/28/2016  . Morbid obesity ([place]) 05/08/2016  . S/P CABG (coronary artery bypass graft) 06/03/2016   Overview:  The patient underwent sternal reconstruction on 06/21/16 with pec flaps for mediastinitis from a prior CABG in May 2017. On admission, she was critically ill from sepsis and had altered mental status. She was last seen in clinic on 08/09/16 at which time she was doing well.  . Severe sepsis ([place]) 06/03/2016  . Sinus tachycardia 05/08/2016  . Tobacco use disorder 04/20/2016   Overview:  Quit in May 2017.  [name] Uncontrolled type 2 diabetes mellitus ([place]) 05/08/2016  . Wound, surgical, infected 06/06/2016   Overview:  sternal    Past Surgical History:  Procedure Laterality Date  . AMPUTATION TOE Right 02/20/2020   Procedure: AMPUTATION RIGHT GREAT  TOE;  Surgeon: Felipa Furnace, DPM;  Location: [place];  Service: Podiatry;  Laterality: Right;  . BLADDER SURGERY    . CARDIAC CATHETERIZATION    . CORONARY ARTERY BYPASS GRAFT    . INCISION AND DRAINAGE Right 02/19/2020   Procedure: INCISION AND DRAINAGE;  Surgeon: Trula Slade, DPM;  Location: [place];  Service: Podiatry;  Laterality: Right;  Block done by surgeon  . METATARSAL HEAD EXCISION  Right 05/02/2020   Procedure: FIRST METATARSAL HEAD RESECTION; RIGHT FOOT WOUND CLOSURE;  Surgeon: Landis Martins, DPM;  Location: [place];  Service: Podiatry;  Laterality: Right;  MAC W/LOCAL  . TUBAL LIGATION       Medications Prior to Admission: Prior to Admission medications   Medication Sig Start Date End Date Taking? Authorizing Provider  acetaminophen (TYLENOL) 500 MG tablet Take 2 tablets (1,000 mg  total) by mouth every 6 (six) hours as needed. 05/02/20 05/02/21  Landis Martins, DPM  aspirin EC 81 MG tablet Take 1 tablet (81 mg total) by mouth daily. 05/13/20   Richardo Priest, MD  clopidogrel (PLAVIX) 75 MG tablet Take 1 tablet (75 mg total) by mouth daily. 03/11/20   Richardo Priest, MD  Cyanocobalamin (VITAMIN B 12 PO) Take 1 tablet by mouth daily.    [provider]  docusate sodium (COLACE) 100 MG capsule Take 1 capsule (100 mg total) by mouth daily as needed. 05/02/20 05/02/21  Landis Martins, DPM  furosemide (LASIX) 40 MG tablet Take 1 tablet by mouth once daily Patient taking differently: Take 40 mg by mouth daily.  02/15/20   Richardo Priest, MD  glipiZIDE (GLUCOTROL) 10 MG tablet Take 10 mg by mouth 2 (two) times daily.    [provider]  Insulin Glargine (BASAGLAR KWIKPEN) 100 UNIT/ML Inject 0.2 mLs (20 Units total) into the skin at bedtime. 02/21/20   Barb Merino, MD  JANUVIA 100 MG tablet Take 100 mg by mouth daily. 02/05/20   [provider]  LORazepam (ATIVAN) 1 MG tablet Take 1 mg by mouth as needed for anxiety.    [provider]  metFORMIN (GLUCOPHAGE-XR) 500 MG 24 hr tablet Take 1,000 mg by mouth 2 (two) times daily. 02/15/20   [provider]  metoprolol succinate (TOPROL-XL) 50 MG 24 hr tablet Take 150 mg by mouth daily.  10/18/16   [provider]  nitroGLYCERIN (NITROSTAT) 0.4 MG SL tablet Place 1 tablet (0.4 mg total) under the tongue every 5 (five) minutes x 3 doses as needed for chest pain. Patient not taking: Reported on 05/13/2020 07/20/19   Richardo Priest, MD  omeprazole (PRILOSEC) 20 MG capsule Take 1 capsule (20 mg total) by mouth daily. 02/12/20   Richardo Priest, MD  pravastatin (PRAVACHOL) 20 MG tablet TAKE 1 TABLET BY MOUTH ONCE DAILY IN THE EVENING Patient taking differently: Take 20 mg by mouth every evening.  02/15/20   Richardo Priest, MD  promethazine (PHENERGAN) 12.5 MG tablet Take 1 tablet (12.5 mg total) by  mouth every 6 (six) hours as needed for nausea or vomiting. Patient not taking: Reported on 05/13/2020 05/02/20   Landis Martins, DPM  ramipril (ALTACE) 1.25 MG capsule Take 1 capsule (1.25 mg total) by mouth 2 (two) times daily. 03/24/20   Richardo Priest, MD  sulfamethoxazole-trimethoprim (BACTRIM) 400-80 MG tablet Take 1 tablet by mouth 2 (two) times daily. 04/27/20   Landis Martins, DPM  vitamin C (ASCORBIC ACID) 250 MG tablet Take 250 mg by mouth daily.    [provider]  Vitamin D, Ergocalciferol, (DRISDOL) 50000 units CAPS capsule Take 50,000 Units by mouth every 7 (seven) days. Saturdays    [provider]     Allergies:    Allergies  Allergen Reactions  . Tramadol   . Codeine Rash    Social History:   Social History   Socioeconomic History  . Marital status: Married    Spouse  name: Not on file  . Number of children: Not on file  . Years of education: Not on file  . Highest education level: Not on file  Occupational History  . Not on file  Tobacco Use  . Smoking status: Former Smoker    Quit date: 05/2016    Years since quitting: 4.0  . Smokeless tobacco: Never Used  Substance and Sexual Activity  . Alcohol use: No  . Drug use: No  . Sexual activity: Yes  Other Topics Concern  . Not on file  Social History Narrative  . Not on file   Social Determinants of Health   Financial Resource Strain:   . Difficulty of Paying Living Expenses:   Food Insecurity:   . Worried About Charity fundraiser in the Last Year:   . Arboriculturist in the Last Year:   Transportation Needs:   . Film/video editor (Medical):   [name] Lack of Transportation (Non-Medical):   Physical Activity:   . Days of Exercise per Week:   . Minutes of Exercise per Session:   Stress:   . Feeling of Stress :   Social Connections:   . Frequency of Communication with Friends and Family:   . Frequency of Social Gatherings with Friends and Family:   . Attends Religious Services:     . Active Member of Clubs or Organizations:   . Attends Archivist Meetings:   [name] Marital Status:   Intimate Partner Violence:   . Fear of Current or Ex-Partner:   . Emotionally Abused:   [name] Physically Abused:   . Sexually Abused:      Family History:   The patient's family history includes Alzheimer's disease in her father; Heart attack in her father; Heart disease in her father; Hyperlipidemia in her brother and mother; Hypertension in her brother, father, and mother.    ROS:  Please see the history of present illness.  All other ROS reviewed and negative.     Physical Exam/Data:   Vitals:   05/22/20 1915  BP: 126/71  Pulse: 80  Resp: 16  Temp: 98.6 F (37 C)  TempSrc: Oral  SpO2: 99%   No intake or output data in the 24 hours ending 05/22/20 1943 Last 3 Weights 05/13/2020 05/02/2020 04/20/2020  Weight (lbs) 229 lb 232 lb 5.8 oz 230 lb  Weight (kg) 103.874 kg 105.4 kg 104.327 kg     There is no height or weight on file to calculate BMI.  Wt Readings from Last 3 Encounters:  05/13/20 103.9 kg  05/02/20 105.4 kg  04/20/20 104.3 kg    Physical Exam: General: Well developed, well nourished, in no acute distress. Head: Normocephalic, atraumatic, sclera non-icteric, no xanthomas, nares are without discharge.  Neck: Negative for carotid bruits. JVD not elevated. Lungs: Clear bilaterally to auscultation without wheezes, rales, or rhonchi. Breathing is unlabored. Heart: RRR with S1 S2. No murmurs, rubs, or gallops appreciated. Abdomen: Soft, non-tender, non-distended with normoactive bowel sounds. No hepatomegaly. No rebound/guarding. No obvious abdominal masses. Msk:  Strength and tone appear normal for age. Extremities: No clubbing or cyanosis. No edema.  Distal pedal pulses are 2+ and equal bilaterally. Neuro: Alert and oriented X 3. No focal deficit. No facial asymmetry. Moves all extremities spontaneously. Psych:  Responds to questions appropriately with a  normal affect.    EKG:  The ECG that was done: see HPI  Relevant CV Studies: None  Laboratory Data:  High Sensitivity Troponin:  No results for input(s): TROPONINIHS in the last 720 hours.    Cardiac EnzymesNo results for input(s): TROPONINI in the last 168 hours. No results for input(s): TROPIPOC in the last 168 hours.  ChemistryNo results for input(s): NA, K, CL, CO2, GLUCOSE, BUN, CREATININE, CALCIUM, GFRNONAA, GFRAA, ANIONGAP in the last 168 hours.  No results for input(s): PROT, ALBUMIN, AST, ALT, ALKPHOS, BILITOT in the last 168 hours. HematologyNo results for input(s): WBC, RBC, HGB, HCT, MCV, MCH, MCHC, RDW, PLT in the last 168 hours. BNPNo results for input(s): BNP, PROBNP in the last 168 hours.  DDimer No results for input(s): DDIMER in the last 168 hours.   Radiology/Studies:  No results found.  Assessment and Plan   1. NSTEMI 59 year old with CAD status post CABG and uncontrolled diabetes presented with a single episode of chest pain, now resolved with IV morphine.  Troponin is suggestive of NSTEMI.  ECG without ST elevation or evidence of ischemia.  Currently she is symptom free.  Plan IV heparin, and echo and left heart cath tomorrow. -- asa 81 -- heparin IV for ACS -- continue home clopidogrel -- continue home metoprolol, ramipril -- continue home pravastatin (unable to tolerate atorva or rosuva per notes) - consider PCSK9  2. Diabetes mellitus Poorly controlled with A1C of 10.  Will continue home lantus 20 units.  Hold januvia, glipizide, metformin.  Consider outpatient endocrine referral for better control.  3. HFpEF Euvolemic.  Continue home lasix.    Severity of Illness: The appropriate patient status for this patient is INPATIENT. Inpatient status is judged to be reasonable and necessary in order to provide the required intensity of service to ensure the patient's safety. The patient's presenting symptoms, physical exam findings, and initial radiographic  and laboratory data in the context of their chronic comorbidities is felt to place them at high risk for further clinical deterioration. Furthermore, it is not anticipated that the patient will be medically stable for discharge from the hospital within 2 midnights of admission. The following factors support the patient status of inpatient.   " The patient's presenting symptoms include chest pain. " The worrisome physical exam findings include none. " The initial radiographic and laboratory data are worrisome because of elevated troponin. " The chronic co-morbidities include diabetes.   * I certify that at the point of admission it is my clinical judgment that the patient will require inpatient hospital care spanning beyond 2 midnights from the point of admission due to high intensity of service, high risk for further deterioration and high frequency of surveillance required.*    For questions or updates, please contact [place] Please consult [email or web address] for contact info under      Signed, Kaitlynd Phillips S, MD 05/22/2020, 7:43 PM

## 2020-05-22 NOTE — ED Notes (Signed)
Pt transferred from [facility] as a code STEMI that was canceled on pt arrival to [facility] ED. Pt got 4 mg MS at 1752, 4,000 units of heparin at 1735 and 324 mg Aspirin at 1734, 4 mg Zofran. Pt continues to have mid cp 2/10 on arrival. Pt is AO x 4, NAD noticed.

## 2020-05-22 NOTE — Progress Notes (Addendum)
ANTICOAGULATION CONSULT NOTE - Initial Consult  Pharmacy Consult for Heparin Indication: chest pain/ACS  Allergies  Allergen Reactions  . Tramadol     Hallucinations  . Codeine Rash    Patient Measurements:    Ht: 67 in    Wt: 103.9 kg IBW: 61.6 kg Heparin Dosing Weight: 85 kg  Vital Signs: Temp: 98.6 F (37 C) (06/06 2200) Temp Source: Oral (06/06 1915) BP: 124/66 (06/06 2200) Pulse Rate: 73 (06/06 2200)  Labs: Recent Labs    05/22/20 2122  HGB 10.1*  HCT 31.1*  PLT 271  APTT 31  LABPROT 13.7  INR 1.1  CREATININE 0.75  TROPONINIHS 619*    Estimated Creatinine Clearance: 93.8 mL/min (by C-G formula based on SCr of 0.75 mg/dL).   Medical History: Past Medical History:  Diagnosis Date  . Acute chest pain 05/08/2016  . Angina pectoris ([place]) 09/11/2017  . Burping 05/08/2016  . Chronic diastolic heart failure ([place]) 11/06/2016  . Coronary artery disease involving native coronary artery of native heart with angina pectoris ([place]) 09/12/2015   Overview:  PCI and stent of RCA 2009, last cath 2012 with medical therapy  CABG May 2017  . Emphysema lung ([place]) 09/11/2017  . Essential hypertension 09/12/2015  . GERD (gastroesophageal reflux disease) 05/08/2016  . Hyperlipidemia 09/12/2015  . Mediastinitis 06/28/2016  . Morbid obesity ([place]) 05/08/2016  . S/P CABG (coronary artery bypass graft) 06/03/2016   Overview:  The patient underwent sternal reconstruction on 06/21/16 with pec flaps for mediastinitis from a prior CABG in May 2017. On admission, she was critically ill from sepsis and had altered mental status. She was last seen in clinic on 08/09/16 at which time she was doing well.  . Severe sepsis ([place]) 06/03/2016  . Sinus tachycardia 05/08/2016  . Tobacco use disorder 04/20/2016   Overview:  Quit in May 2017.  [name] Uncontrolled type 2 diabetes mellitus ([place]) 05/08/2016  . Wound, surgical, infected 06/06/2016   Overview:  sternal    Medications:  Medications Prior to Admission   Medication Sig Dispense Refill Last Dose  . acetaminophen (TYLENOL) 500 MG tablet Take 2 tablets (1,000 mg total) by mouth every 6 (six) hours as needed. (Patient taking differently: Take 1,000 mg by mouth every 6 (six) hours as needed for mild pain. ) 100 tablet 2 05/21/2020 at Unknown time  . aspirin EC 81 MG tablet Take 1 tablet (81 mg total) by mouth daily. 90 tablet 3 05/21/2020 at Unknown time  . clopidogrel (PLAVIX) 75 MG tablet Take 1 tablet (75 mg total) by mouth daily. 30 tablet 10 05/21/2020 at Unknown time  . Cyanocobalamin (VITAMIN B 12 PO) Take 1 tablet by mouth daily.   05/21/2020 at Unknown time  . docusate sodium (COLACE) 100 MG capsule Take 1 capsule (100 mg total) by mouth daily as needed. (Patient taking differently: Take 100 mg by mouth daily as needed for mild constipation. ) 30 capsule 2 Past Week at Unknown time  . furosemide (LASIX) 40 MG tablet Take 1 tablet by mouth once daily (Patient taking differently: Take 40 mg by mouth daily. ) 90 tablet 2 05/21/2020 at Unknown time  . glipiZIDE (GLUCOTROL) 10 MG tablet Take 10 mg by mouth 2 (two) times daily.   05/22/2020 at Unknown time  . Insulin Glargine (BASAGLAR KWIKPEN) 100 UNIT/ML Inject 0.2 mLs (20 Units total) into the skin at bedtime. 15 mL 0 05/22/2020 at Unknown time  . JANUVIA 100 MG tablet Take 100 mg by mouth daily.  05/21/2020 at Unknown time  . LORazepam (ATIVAN) 1 MG tablet Take 1 mg by mouth as needed for anxiety.   Past Month at Unknown time  . metFORMIN (GLUCOPHAGE-XR) 500 MG 24 hr tablet Take 1,000 mg by mouth 2 (two) times daily.   05/22/2020 at Unknown time  . metoprolol succinate (TOPROL-XL) 50 MG 24 hr tablet Take 150 mg by mouth daily.    05/22/2020 at 0900  . nitroGLYCERIN (NITROSTAT) 0.4 MG SL tablet Place 1 tablet (0.4 mg total) under the tongue every 5 (five) minutes x 3 doses as needed for chest pain. 30 tablet 11 05/22/2020 at Unknown time  . omeprazole (PRILOSEC) 20 MG capsule Take 1 capsule (20 mg total) by mouth daily.  90 capsule 1 05/22/2020 at Unknown time  . pravastatin (PRAVACHOL) 20 MG tablet TAKE 1 TABLET BY MOUTH ONCE DAILY IN THE EVENING (Patient taking differently: Take 20 mg by mouth every evening. ) 90 tablet 2 05/21/2020 at Unknown time  . ramipril (ALTACE) 1.25 MG capsule Take 1 capsule (1.25 mg total) by mouth 2 (two) times daily. 60 capsule 3 05/22/2020 at Unknown time  . vitamin C (ASCORBIC ACID) 250 MG tablet Take 250 mg by mouth daily.   05/21/2020 at Unknown time  . Vitamin D, Ergocalciferol, (DRISDOL) 50000 units CAPS capsule Take 50,000 Units by mouth every 7 (seven) days. Saturdays   05/21/2020 at Unknown time  . promethazine (PHENERGAN) 12.5 MG tablet Take 1 tablet (12.5 mg total) by mouth every 6 (six) hours as needed for nausea or vomiting. (Patient not taking: Reported on 05/13/2020) 30 tablet 0 Not Taking at Unknown time  . sulfamethoxazole-trimethoprim (BACTRIM) 400-80 MG tablet Take 1 tablet by mouth 2 (two) times daily. (Patient not taking: Reported on 05/22/2020) 28 tablet 0 Not Taking at Unknown time    Assessment: 59 y.o. F presented to [place] with CP. Transferred to [facility]. Pt received heparin 4000 unit bolus en route to [facility] ~1735. To begin heparin per pharmacy for NSTEMI. No AC PTA. Hgb 10.1 (stable) on admission  Goal of Therapy:  Heparin level 0.3-0.7 units/ml Monitor platelets by anticoagulation protocol: Yes   Plan:  Rebolus heparin 4000 units IV Heparin gtt at 1200 units/hr Will f/u heparin level in 6 hours Daily heparin level and CBC  Sherlon Handing, PharmD, BCPS Please see amion for complete clinical pharmacist phone list 05/22/2020,11:00 PM   Addendum [phone]). Heparin level subtherapeutic (0.25) on gtt at 1200 units/hr. No issues with line or bleeding reported per RN.  Plan: Increase heparin gtt to 1350 units/hr and f/u post cath.  Sherlon Handing, PharmD, BCPS Please see amion for complete clinical pharmacist phone list 05/23/2020 6:14 AM

## 2020-05-23 ENCOUNTER — Encounter (HOSPITAL_COMMUNITY): Payer: Self-pay | Admitting: Cardiology

## 2020-05-23 ENCOUNTER — Encounter (HOSPITAL_COMMUNITY)
Admission: RE | Disposition: A | Discharge status: Home / Self Care | Payer: Self-pay | Source: Other Acute Inpatient Hospital | Attending: Cardiology

## 2020-05-23 ENCOUNTER — Inpatient Hospital Stay (HOSPITAL_COMMUNITY): Payer: Commercial Managed Care - PPO

## 2020-05-23 DIAGNOSIS — E785 Hyperlipidemia, unspecified: Secondary | ICD-10-CM

## 2020-05-23 DIAGNOSIS — I340 Nonrheumatic mitral (valve) insufficiency: Secondary | ICD-10-CM

## 2020-05-23 DIAGNOSIS — I2510 Atherosclerotic heart disease of native coronary artery without angina pectoris: Secondary | ICD-10-CM

## 2020-05-23 DIAGNOSIS — I214 Non-ST elevation (NSTEMI) myocardial infarction: Principal | ICD-10-CM

## 2020-05-23 DIAGNOSIS — I10 Essential (primary) hypertension: Secondary | ICD-10-CM

## 2020-05-23 HISTORY — PX: LEFT HEART CATH AND CORS/GRAFTS ANGIOGRAPHY: CATH118250

## 2020-05-23 LAB — CBC
HCT: 30.6 % — ABNORMAL LOW (ref 36.0–46.0)
Hemoglobin: 10 g/dL — ABNORMAL LOW (ref 12.0–15.0)
MCH: 28.2 pg (ref 26.0–34.0)
MCHC: 32.7 g/dL (ref 30.0–36.0)
MCV: 86.2 fL (ref 80.0–100.0)
Platelets: 231 10*3/uL (ref 150–400)
RBC: 3.55 MIL/uL — ABNORMAL LOW (ref 3.87–5.11)
RDW: 14.1 % (ref 11.5–15.5)
WBC: 7.9 10*3/uL (ref 4.0–10.5)
nRBC: 0 % (ref 0.0–0.2)

## 2020-05-23 LAB — GLUCOSE, CAPILLARY
Glucose-Capillary: 254 mg/dL — ABNORMAL HIGH (ref 70–99)
Glucose-Capillary: 244 mg/dL — ABNORMAL HIGH (ref 70–99)
Glucose-Capillary: 172 mg/dL — ABNORMAL HIGH (ref 70–99)
Glucose-Capillary: 204 mg/dL — ABNORMAL HIGH (ref 70–99)

## 2020-05-23 LAB — HEPARIN LEVEL (UNFRACTIONATED): Heparin Unfractionated: 0.25 IU/mL — ABNORMAL LOW (ref 0.30–0.70)

## 2020-05-23 LAB — TROPONIN I (HIGH SENSITIVITY): Troponin I (High Sensitivity): 1404 ng/L (ref <18)

## 2020-05-23 LAB — ECHOCARDIOGRAM COMPLETE
Height: 67 in
Weight: 3646.41 oz

## 2020-05-23 LAB — LDL CHOLESTEROL, DIRECT: Direct LDL: 91.4 mg/dL (ref 0–99)

## 2020-05-23 SURGERY — LEFT HEART CATH AND CORS/GRAFTS ANGIOGRAPHY
Anesthesia: LOCAL

## 2020-05-23 MED ORDER — HEPARIN (PORCINE) IN NACL 1000-0.9 UT/500ML-% IV SOLN
INTRAVENOUS | Status: AC
  Filled 2020-05-23: qty 1000

## 2020-05-23 MED ORDER — SODIUM CHLORIDE 0.9 % IV SOLN: 250.0000 mL | INTRAVENOUS | Status: DC | PRN

## 2020-05-23 MED ORDER — HEPARIN (PORCINE) IN NACL 1000-0.9 UT/500ML-% IV SOLN
INTRAVENOUS | Status: DC | PRN
  Administered 2020-05-23 (×2): 500 mL

## 2020-05-23 MED ORDER — ASPIRIN 81 MG PO CHEW: 81.0000 mg | CHEWABLE_TABLET | ORAL | Status: DC

## 2020-05-23 MED ORDER — HEPARIN (PORCINE) 25000 UT/250ML-% IV SOLN
1500.0000 [IU]/h | INTRAVENOUS | Status: DC
  Administered 2020-05-23: 1400 [IU]/h via INTRAVENOUS
  Filled 2020-05-23 (×2): qty 250

## 2020-05-23 MED ORDER — IOHEXOL 350 MG/ML SOLN
INTRAVENOUS | Status: DC | PRN
  Administered 2020-05-23: 100 mL

## 2020-05-23 MED ORDER — VERAPAMIL HCL 2.5 MG/ML IV SOLN
INTRAVENOUS | Status: DC | PRN
  Administered 2020-05-23: 10 mL via INTRA_ARTERIAL

## 2020-05-23 MED ORDER — SODIUM CHLORIDE 0.9% FLUSH
3.0000 mL | Freq: Two times a day (BID) | INTRAVENOUS | Status: DC
  Administered 2020-05-24: 3 mL via INTRAVENOUS

## 2020-05-23 MED ORDER — SODIUM CHLORIDE 0.9 % WEIGHT BASED INFUSION: 1.0000 mL/kg/h | INTRAVENOUS | Status: DC

## 2020-05-23 MED ORDER — LIDOCAINE HCL (PF) 1 % IJ SOLN
INTRAMUSCULAR | Status: DC | PRN
  Administered 2020-05-23: 2 mL

## 2020-05-23 MED ORDER — HEPARIN SODIUM (PORCINE) 1000 UNIT/ML IJ SOLN
INTRAMUSCULAR | Status: AC
  Filled 2020-05-23: qty 1

## 2020-05-23 MED ORDER — IOHEXOL 350 MG/ML SOLN
INTRAVENOUS | Status: AC
  Filled 2020-05-23: qty 1

## 2020-05-23 MED ORDER — HEPARIN (PORCINE) 25000 UT/250ML-% IV SOLN: 1400.0000 [IU]/h | INTRAVENOUS | Status: DC

## 2020-05-23 MED ORDER — SODIUM CHLORIDE 0.9% FLUSH: 3.0000 mL | INTRAVENOUS | Status: DC | PRN

## 2020-05-23 MED ORDER — SODIUM CHLORIDE 0.9 % WEIGHT BASED INFUSION: 3.0000 mL/kg/h | INTRAVENOUS | Status: DC

## 2020-05-23 MED ORDER — PERFLUTREN LIPID MICROSPHERE
1.0000 mL | INTRAVENOUS | Status: AC | PRN
  Administered 2020-05-23: 2 mL via INTRAVENOUS
  Filled 2020-05-23: qty 10

## 2020-05-23 MED ORDER — ICOSAPENT ETHYL 1 G PO CAPS
2.0000 g | ORAL_CAPSULE | Freq: Two times a day (BID) | ORAL | Status: DC
  Administered 2020-05-23 – 2020-05-26 (×7): 2 g via ORAL
  Filled 2020-05-23 (×8): qty 2

## 2020-05-23 MED ORDER — FENTANYL CITRATE (PF) 100 MCG/2ML IJ SOLN
INTRAMUSCULAR | Status: DC | PRN
  Administered 2020-05-23: 25 ug via INTRAVENOUS
  Administered 2020-05-23: 50 ug via INTRAVENOUS
  Administered 2020-05-23: 25 ug via INTRAVENOUS

## 2020-05-23 MED ORDER — SODIUM CHLORIDE 0.9% FLUSH: 3.0000 mL | Freq: Two times a day (BID) | INTRAVENOUS | Status: DC

## 2020-05-23 MED ORDER — ATORVASTATIN CALCIUM 80 MG PO TABS
80.0000 mg | ORAL_TABLET | Freq: Every day | ORAL | Status: DC
  Administered 2020-05-23 – 2020-05-26 (×4): 80 mg via ORAL
  Filled 2020-05-23 (×4): qty 1

## 2020-05-23 MED ORDER — HEPARIN SODIUM (PORCINE) 1000 UNIT/ML IJ SOLN
INTRAMUSCULAR | Status: DC | PRN
  Administered 2020-05-23: 5000 [IU] via INTRAVENOUS

## 2020-05-23 MED ORDER — SODIUM CHLORIDE 0.9 % IV SOLN: INTRAVENOUS | Status: AC

## 2020-05-23 MED ORDER — FENTANYL CITRATE (PF) 100 MCG/2ML IJ SOLN
INTRAMUSCULAR | Status: AC
  Filled 2020-05-23: qty 2

## 2020-05-23 MED ORDER — MIDAZOLAM HCL 2 MG/2ML IJ SOLN
INTRAMUSCULAR | Status: AC
  Filled 2020-05-23: qty 2

## 2020-05-23 MED ORDER — LABETALOL HCL 5 MG/ML IV SOLN: 10.0000 mg | INTRAVENOUS | Status: AC | PRN

## 2020-05-23 MED ORDER — VERAPAMIL HCL 2.5 MG/ML IV SOLN
INTRAVENOUS | Status: AC
  Filled 2020-05-23: qty 2

## 2020-05-23 MED ORDER — MIDAZOLAM HCL 2 MG/2ML IJ SOLN
INTRAMUSCULAR | Status: DC | PRN
  Administered 2020-05-23 (×3): 1 mg via INTRAVENOUS

## 2020-05-23 MED ORDER — LIDOCAINE HCL (PF) 1 % IJ SOLN
INTRAMUSCULAR | Status: AC
  Filled 2020-05-23: qty 30

## 2020-05-23 MED ORDER — HYDRALAZINE HCL 20 MG/ML IJ SOLN: 10.0000 mg | INTRAMUSCULAR | Status: AC | PRN

## 2020-05-23 SURGICAL SUPPLY — 15 items
CATH INFINITI 5 FR IM (CATHETERS) ×1 IMPLANT
CATH INFINITI 5 FR MPA2 (CATHETERS) ×1 IMPLANT
CATH INFINITI 5FR MULTPACK ANG (CATHETERS) ×1 IMPLANT
DEVICE RAD COMP TR BAND LRG (VASCULAR PRODUCTS) ×1 IMPLANT
GLIDESHEATH SLEND SS 6F .021 (SHEATH) ×1 IMPLANT
GUIDEWIRE INQWIRE 1.5J.035X260 (WIRE) IMPLANT
INQWIRE 1.5J .035X260CM (WIRE) ×2
KIT HEART LEFT (KITS) ×2 IMPLANT
PACK CARDIAC CATHETERIZATION (CUSTOM PROCEDURE TRAY) ×2 IMPLANT
SHEATH GLIDE SLENDER 4/5FR (SHEATH) ×1 IMPLANT
SHEATH PROBE COVER 6X72 (BAG) ×1 IMPLANT
SYR MEDRAD MARK 7 150ML (SYRINGE) ×1 IMPLANT
TRANSDUCER W/STOPCOCK (MISCELLANEOUS) ×2 IMPLANT
TUBING CIL FLEX 10 FLL-RA (TUBING) ×2 IMPLANT
WIRE HI TORQ VERSACORE-J 145CM (WIRE) ×1 IMPLANT

## 2020-05-23 NOTE — H&P (View-Only) (Signed)
Progress Note  Patient Name: Jasmine Richardson Date of Encounter: 05/23/2020  [facility] Cardiologist: Candee Furbish, MD   Subjective   Plan for cath today. Patient is chest pain free.   Inpatient Medications    Scheduled Meds: . aspirin EC  81 mg Oral Daily  . clopidogrel  75 mg Oral Daily  . furosemide  40 mg Oral Daily  . insulin aspart  0-9 Units Subcutaneous TID WC  . insulin glargine  20 Units Subcutaneous QHS  . metoprolol succinate  150 mg Oral Daily  . pantoprazole  40 mg Oral Daily  . pravastatin  20 mg Oral QPM  . ramipril  1.25 mg Oral BID   Continuous Infusions: . heparin 1,350 Units/hr (05/23/20 0641)   PRN Meds: acetaminophen, nitroGLYCERIN, ondansetron (ZOFRAN) IV   Vital Signs    Vitals:   05/22/20 2200 05/22/20 2308 05/23/20 0031 05/23/20 0430  BP: 124/66 131/83 115/65 (!) 117/56  Pulse: 73 79  70  Resp: 17 17  16   Temp: 98.6 F (37 C) 97.6 F (36.4 C)  (!) 97.5 F (36.4 C)  TempSrc:  Oral  Oral  SpO2: 99% 100%  100%  Weight:  103.4 kg  103.4 kg  Height:  5\' 7"  (1.702 m)      Intake/Output Summary (Last 24 hours) at 05/23/2020 0710 Last data filed at 05/23/2020 0300 Gross per 24 hour  Intake 70.61 ml  Output 200 ml  Net -129.39 ml   Last 3 Weights 05/23/2020 05/22/2020 05/13/2020  Weight (lbs) 227 lb 14.4 oz 227 lb 14.4 oz 229 lb  Weight (kg) 103.375 kg 103.375 kg 103.874 kg      Telemetry    NSR HR 60s - Personally Reviewed  ECG    NSR, RBBB, minimal STE V1 and V2 - Personally Reviewed  Physical Exam   GEN: No acute distress.   Neck: No JVD Cardiac: RRR, no murmurs, rubs, or gallops.  Respiratory: Clear to auscultation bilaterally. GI: Soft, nontender, non-distended  MS: No edema; No deformity. Neuro:  Nonfocal  Psych: Normal affect   Labs    High Sensitivity Troponin:   Recent Labs  Lab 05/22/20 2122  TROPONINIHS 619*      Chemistry Recent Labs  Lab 05/22/20 2122  NA 138  K 3.9  CL 104  CO2 23  GLUCOSE 232*    BUN 13  CREATININE 0.75  CALCIUM 8.7*  GFRNONAA >60  GFRAA >60  ANIONGAP 11     Hematology Recent Labs  Lab 05/22/20 2122 05/23/20 0542  WBC 8.8 7.9  RBC 3.60* 3.55*  HGB 10.1* 10.0*  HCT 31.1* 30.6*  MCV 86.4 86.2  MCH 28.1 28.2  MCHC 32.5 32.7  RDW 14.0 14.1  PLT 271 231    BNPNo results for input(s): BNP, PROBNP in the last 168 hours.   DDimer No results for input(s): DDIMER in the last 168 hours.   Radiology    No results found.  Cardiac Studies   Echo ordered  Patient Profile     59 y.o. female  with history of CAD status post CABGx4 in 2017 (LIMA to LAD, SVG to OM1, diag, and PDA) c/b mediastinitis requiring reoperation, PCI to RCA in 2009, diabetes, hypertension, diastolic heart failure, hyperlipidemia, who presents with chest pain.   Assessment & Plan    NSTEMI/CAD s/p CABG - From [facility] presented with CP resolved with morphine - Initial EKG questionable for STEMI, however canceled on arrival to [facility] - Troponin elevated  to 619 at [facility]. Will check another level - IV heparin - continue plavix, metoprolol, ramipril, pravastatin - creatinine stable - Plan for [facility] cath.  Risks and benefits of cardiac catheterization have been discussed with the patient.  These include bleeding, infection, kidney damage, stroke, heart attack, death.  The patient understands these risks and is willing to proceed.  HTN - Metoprolol and Ramipril - pressures stable  HLD - pravastatin, previously could not tolerate atorvastatin or rosuvastatin - LDL unable to calculate, HDL 37, TG 559  DM2 - A1C 10 - SSI  Chronic diastolic HF - euvolemic - home lasix  For questions or updates, please contact [facility] Please consult [email or web address] for contact info under        Signed, Elvyn Krohn Ninfa Meeker, PA-C  05/23/2020, 7:10 AM

## 2020-05-23 NOTE — Interval H&P Note (Signed)
History and Physical Interval Note:  05/23/2020 3:42 PM  Jasmine Richardson  has presented today for surgery, with the diagnosis of NSTEMI.  The various methods of treatment have been discussed with the patient and family. After consideration of risks, benefits and other options for treatment, the patient has consented to  Procedure(s): LEFT HEART CATH AND CORS/GRAFTS ANGIOGRAPHY (N/A) as a surgical intervention.  The patient's history has been reviewed, patient examined, no change in status, stable for surgery.  I have reviewed the patient's chart and labs.  Questions were answered to the patient's satisfaction.    Cath Lab Visit (complete for each Cath Lab visit)  Clinical Evaluation Leading to the Procedure:   ACS: Yes.    Non-ACS:  N/A  Jasmine Richardson

## 2020-05-23 NOTE — Progress Notes (Addendum)
Inpatient Diabetes Program Recommendations  AACE/ADA: New Consensus Statement on Inpatient Glycemic Control (2015)  Target Ranges:  Prepandial:   less than 140 mg/dL      Peak postprandial:   less than 180 mg/dL (1-2 hours)      Critically ill patients:  140 - 180 mg/dL   Lab Results  Component Value Date   GLUCAP 254 (H) 05/23/2020   HGBA1C 10.1 (H) 05/22/2020    Review of Glycemic Control Results for CYPRESS, HINKSON (MRN [id number]) as of 05/23/2020 10:16  Ref. Range 05/23/2020 07:32  Glucose-Capillary Latest Ref Range: 70 - 99 mg/dL 254 (H)   Diabetes history: Type 2 DM Outpatient Diabetes medications: Metformin 1000 mg BID, Januvia 100 mg QD, Basaglar 20 units QD Current orders for Inpatient glycemic control: Lantus 20 units QHS, Novolog 0-9 units TID  Inpatient Diabetes Program Recommendations:    Consider increasing Lantus to 28 units QHS. Will plan to speak to patient.   Addendum: spoke with patient regarding outpatient diabetes management.  Reviewed patient's current A1c of 10.1% which is down form 11.7%. Explained what a A1c is and what it measures. Also reviewed goal A1c with patient, importance of good glucose control @ home, and blood sugar goals. Reviewed patho of DM, need for insulin, role of pancreas, vascular changes and commorbidities. Patient a meter and supplies and reports checking 2 times per day with range from 200-225 mg/dL. Discussed endocrinology, will attach list to DC summary. Anticipate patient needs adjustment to insulin.  Denies drinking sugary beverages. Reviewed plate method, importance of carb mindfulness and goal setting. Patient to make appointment with PCP and has no further questions.   Thanks, Bronson Curb, MSN, RNC-OB Diabetes Coordinator [phone] (8a-5p)

## 2020-05-23 NOTE — Progress Notes (Signed)
Progress Note  Patient Name: Jasmine Richardson Date of Encounter: 05/23/2020  [facility] Cardiologist: Candee Furbish, MD   Subjective   Plan for cath today. Patient is chest pain free.   Inpatient Medications    Scheduled Meds: . aspirin EC  81 mg Oral Daily  . clopidogrel  75 mg Oral Daily  . furosemide  40 mg Oral Daily  . insulin aspart  0-9 Units Subcutaneous TID WC  . insulin glargine  20 Units Subcutaneous QHS  . metoprolol succinate  150 mg Oral Daily  . pantoprazole  40 mg Oral Daily  . pravastatin  20 mg Oral QPM  . ramipril  1.25 mg Oral BID   Continuous Infusions: . heparin 1,350 Units/hr (05/23/20 0641)   PRN Meds: acetaminophen, nitroGLYCERIN, ondansetron (ZOFRAN) IV   Vital Signs    Vitals:   05/22/20 2200 05/22/20 2308 05/23/20 0031 05/23/20 0430  BP: 124/66 131/83 115/65 (!) 117/56  Pulse: 73 79  70  Resp: 17 17  16   Temp: 98.6 F (37 C) 97.6 F (36.4 C)  (!) 97.5 F (36.4 C)  TempSrc:  Oral  Oral  SpO2: 99% 100%  100%  Weight:  103.4 kg  103.4 kg  Height:  5\' 7"  (1.702 m)      Intake/Output Summary (Last 24 hours) at 05/23/2020 0710 Last data filed at 05/23/2020 0300 Gross per 24 hour  Intake 70.61 ml  Output 200 ml  Net -129.39 ml   Last 3 Weights 05/23/2020 05/22/2020 05/13/2020  Weight (lbs) 227 lb 14.4 oz 227 lb 14.4 oz 229 lb  Weight (kg) 103.375 kg 103.375 kg 103.874 kg      Telemetry    NSR HR 60s - Personally Reviewed  ECG    NSR, RBBB, minimal STE V1 and V2 - Personally Reviewed  Physical Exam   GEN: No acute distress.   Neck: No JVD Cardiac: RRR, no murmurs, rubs, or gallops.  Respiratory: Clear to auscultation bilaterally. GI: Soft, nontender, non-distended  MS: No edema; No deformity. Neuro:  Nonfocal  Psych: Normal affect   Labs    High Sensitivity Troponin:   Recent Labs  Lab 05/22/20 2122  TROPONINIHS 619*      Chemistry Recent Labs  Lab 05/22/20 2122  NA 138  K 3.9  CL 104  CO2 23  GLUCOSE 232*    BUN 13  CREATININE 0.75  CALCIUM 8.7*  GFRNONAA >60  GFRAA >60  ANIONGAP 11     Hematology Recent Labs  Lab 05/22/20 2122 05/23/20 0542  WBC 8.8 7.9  RBC 3.60* 3.55*  HGB 10.1* 10.0*  HCT 31.1* 30.6*  MCV 86.4 86.2  MCH 28.1 28.2  MCHC 32.5 32.7  RDW 14.0 14.1  PLT 271 231    BNPNo results for input(s): BNP, PROBNP in the last 168 hours.   DDimer No results for input(s): DDIMER in the last 168 hours.   Radiology    No results found.  Cardiac Studies   Echo ordered  Patient Profile     59 y.o. female  with history of CAD status post CABGx4 in 2017 (LIMA to LAD, SVG to OM1, diag, and PDA) c/b mediastinitis requiring reoperation, PCI to RCA in 2009, diabetes, hypertension, diastolic heart failure, hyperlipidemia, who presents with chest pain.   Assessment & Plan    NSTEMI/CAD s/p CABG - From [facility] presented with CP resolved with morphine - Initial EKG questionable for STEMI, however canceled on arrival to [facility] - Troponin elevated  to 619 at [facility]. Will check another level - IV heparin - continue plavix, metoprolol, ramipril, pravastatin - creatinine stable - Plan for [facility] cath.  Risks and benefits of cardiac catheterization have been discussed with the patient.  These include bleeding, infection, kidney damage, stroke, heart attack, death.  The patient understands these risks and is willing to proceed.  HTN - Metoprolol and Ramipril - pressures stable  HLD - pravastatin, previously could not tolerate atorvastatin or rosuvastatin - LDL unable to calculate, HDL 37, TG 559  DM2 - A1C 10 - SSI  Chronic diastolic HF - euvolemic - home lasix  For questions or updates, please contact [facility] Please consult [email or web address] for contact info under        Signed, Piper Albro Ninfa Meeker, PA-C  05/23/2020, 7:10 AM

## 2020-05-23 NOTE — Progress Notes (Signed)
[place] for Heparin Indication: chest pain/ACS  Allergies  Allergen Reactions  . Tramadol     Hallucinations  . Codeine Rash    Patient Measurements: Height: 5\' 7"  (170.2 cm) Weight: 103.4 kg (227 lb 14.4 oz) IBW/kg (Calculated) : 61.6  Ht: 67 in    Wt: 103.9 kg IBW: 61.6 kg Heparin Dosing Weight: 85 kg  Vital Signs: Temp: 97.8 F (36.6 C) (06/07 1715) Temp Source: Oral (06/07 1715) BP: 147/84 (06/07 1715) Pulse Rate: 71 (06/07 1715)  Labs: Recent Labs    05/22/20 2122 05/23/20 0542  HGB 10.1* 10.0*  HCT 31.1* 30.6*  PLT 271 231  APTT 31  --   LABPROT 13.7  --   INR 1.1  --   HEPARINUNFRC  --  0.25*  CREATININE 0.75  --   TROPONINIHS 619* 1,404*    Estimated Creatinine Clearance: 93.6 mL/min (by C-G formula based on SCr of 0.75 mg/dL).    Assessment: 59 y.o. F presented to [employer] [facility] with CP.  Now s/p cath with heparin to resume 1 hour post TR band removal  Goal of Therapy:  Heparin level 0.3-0.7 units/ml Monitor platelets by anticoagulation protocol: Yes   Plan:  Heparin at 1400 units / hr Daily heparin level and CBC  Thank you Anette Guarneri, PharmD 05/23/2020,5:22 PM

## 2020-05-23 NOTE — Progress Notes (Signed)
  Echocardiogram 2D Echocardiogram with definity has been performed.  Jasmine Richardson M 05/23/2020, 11:15 AM

## 2020-05-24 ENCOUNTER — Telehealth: Payer: Self-pay | Admitting: Urology

## 2020-05-24 LAB — CBC
HCT: 31.2 % — ABNORMAL LOW (ref 36.0–46.0)
Hemoglobin: 10.4 g/dL — ABNORMAL LOW (ref 12.0–15.0)
MCH: 28.7 pg (ref 26.0–34.0)
MCHC: 33.3 g/dL (ref 30.0–36.0)
MCV: 86.2 fL (ref 80.0–100.0)
Platelets: 260 10*3/uL (ref 150–400)
RBC: 3.62 MIL/uL — ABNORMAL LOW (ref 3.87–5.11)
RDW: 14.2 % (ref 11.5–15.5)
WBC: 7.2 10*3/uL (ref 4.0–10.5)
nRBC: 0 % (ref 0.0–0.2)

## 2020-05-24 LAB — BASIC METABOLIC PANEL
Anion gap: 9 (ref 5–15)
BUN: 9 mg/dL (ref 6–20)
CO2: 25 mmol/L (ref 22–32)
Calcium: 9.1 mg/dL (ref 8.9–10.3)
Chloride: 102 mmol/L (ref 98–111)
Creatinine, Ser: 0.78 mg/dL (ref 0.44–1.00)
GFR calc Af Amer: >60 mL/min (ref >60)
GFR calc non Af Amer: >60 mL/min (ref >60)
Glucose, Bld: 268 mg/dL — ABNORMAL HIGH (ref 70–99)
Potassium: 3.7 mmol/L (ref 3.5–5.1)
Sodium: 136 mmol/L (ref 135–145)

## 2020-05-24 LAB — GLUCOSE, CAPILLARY
Glucose-Capillary: 243 mg/dL — ABNORMAL HIGH (ref 70–99)
Glucose-Capillary: 282 mg/dL — ABNORMAL HIGH (ref 70–99)
Glucose-Capillary: 278 mg/dL — ABNORMAL HIGH (ref 70–99)
Glucose-Capillary: 338 mg/dL — ABNORMAL HIGH (ref 70–99)

## 2020-05-24 LAB — HEPARIN LEVEL (UNFRACTIONATED): Heparin Unfractionated: 0.31 IU/mL (ref 0.30–0.70)

## 2020-05-24 MED ORDER — SODIUM CHLORIDE 0.9% FLUSH: 3.0000 mL | INTRAVENOUS | Status: DC | PRN

## 2020-05-24 MED ORDER — SODIUM CHLORIDE 0.9% FLUSH
3.0000 mL | Freq: Two times a day (BID) | INTRAVENOUS | Status: DC
  Administered 2020-05-25: 3 mL via INTRAVENOUS

## 2020-05-24 MED ORDER — SODIUM CHLORIDE 0.9 % WEIGHT BASED INFUSION: 1.0000 mL/kg/h | INTRAVENOUS | Status: DC

## 2020-05-24 MED ORDER — ASPIRIN 81 MG PO CHEW
81.0000 mg | CHEWABLE_TABLET | ORAL | Status: AC
  Administered 2020-05-25: 81 mg via ORAL
  Filled 2020-05-24: qty 1

## 2020-05-24 MED ORDER — SODIUM CHLORIDE 0.9 % IV SOLN: 250.0000 mL | INTRAVENOUS | Status: DC | PRN

## 2020-05-24 MED ORDER — SODIUM CHLORIDE 0.9 % WEIGHT BASED INFUSION
3.0000 mL/kg/h | INTRAVENOUS | Status: DC
  Administered 2020-05-25: 3 mL/kg/h via INTRAVENOUS

## 2020-05-24 NOTE — Progress Notes (Signed)
Inpatient Diabetes Program Recommendations  AACE/ADA: New Consensus Statement on Inpatient Glycemic Control (2015)  Target Ranges:  Prepandial:   less than 140 mg/dL      Peak postprandial:   less than 180 mg/dL (1-2 hours)      Critically ill patients:  140 - 180 mg/dL   Lab Results  Component Value Date   GLUCAP 282 (H) 05/24/2020   HGBA1C 10.1 (H) 05/22/2020    Review of Glycemic Control Results for Jasmine Richardson, Jasmine Richardson (MRN [id number]) as of 05/24/2020 13:16  Ref. Range 05/23/2020 11:35 05/23/2020 17:38 05/23/2020 22:03 05/24/2020 07:56 05/24/2020 10:59  Glucose-Capillary Latest Ref Range: 70 - 99 mg/dL 244 (H) 172 (H) 204 (H) 243 (H) 282 (H)   Diabetes history: Type 2 DM Outpatient Diabetes medications: Metformin 1000 mg BID, Januvia 100 mg QD, Basaglar 20 units QD Current orders for Inpatient glycemic control: Lantus 20 units QHS, Novolog 0-9 units TID Inpatient Diabetes Program Recommendations:   May consider increasing Lantus to 25 units daily and Novolog meal coverage 3 units tid with meals (hold if patient eats less than 50%).   Thanks,  Jasmine Perl, RN, BC-ADM Inpatient Diabetes Coordinator Pager [phone] (8a-5p)

## 2020-05-24 NOTE — Progress Notes (Signed)
Cardiology Progress Note  [name] MRN: [id number] DOB: 07-30-1961 Date of Encounter: 05/24/2020  Primary Cardiologist: Candee Furbish, MD  Subjective   Chief Complaint: NSTEMI  HPI: No chest pain. LHC reviewed with her. Left radial site looks clean and dry.   ROS:  All other ROS reviewed and negative. Pertinent positives noted in the HPI.     Inpatient Medications  Scheduled Meds: . aspirin EC  81 mg Oral Daily  . atorvastatin  80 mg Oral Daily  . clopidogrel  75 mg Oral Daily  . furosemide  40 mg Oral Daily  . icosapent Ethyl  2 g Oral BID  . insulin aspart  0-9 Units Subcutaneous TID WC  . insulin glargine  20 Units Subcutaneous QHS  . metoprolol succinate  150 mg Oral Daily  . pantoprazole  40 mg Oral Daily  . ramipril  1.25 mg Oral BID  . sodium chloride flush  3 mL Intravenous Q12H  . sodium chloride flush  3 mL Intravenous Q12H   Continuous Infusions: . sodium chloride    . heparin 1,500 Units/hr (05/24/20 1011)   PRN Meds: sodium chloride, acetaminophen, nitroGLYCERIN, ondansetron (ZOFRAN) IV, sodium chloride flush   Vital Signs   Vitals:   05/23/20 2024 05/24/20 0105 05/24/20 0346 05/24/20 0759  BP: 129/73 110/65 (!) 146/85 (!) 147/84  Pulse: 81 79 78 82  Resp: 17 16  16   Temp: 98.6 F (37 C) 98.3 F (36.8 C) 97.7 F (36.5 C) (!) 97.5 F (36.4 C)  TempSrc: Oral Oral Oral Oral  SpO2: 93% 96% 96% 98%  Weight:   102.4 kg   Height:        Intake/Output Summary (Last 24 hours) at 05/24/2020 1059 Last data filed at 05/24/2020 0900 Gross per 24 hour  Intake 920.9 ml  Output 1900 ml  Net -979.1 ml   Last 3 Weights 05/24/2020 05/23/2020 05/22/2020  Weight (lbs) 225 lb 11.2 oz 227 lb 14.4 oz 227 lb 14.4 oz  Weight (kg) 102.377 kg 103.375 kg 103.375 kg      Telemetry  Overnight telemetry shows SR 70s, which I personally reviewed.   ECG  The most recent ECG shows SR 79 bpm, RBBB, anteroseptal infarct, which I personally reviewed.   Physical  Exam   Vitals:   05/23/20 2024 05/24/20 0105 05/24/20 0346 05/24/20 0759  BP: 129/73 110/65 (!) 146/85 (!) 147/84  Pulse: 81 79 78 82  Resp: 17 16  16   Temp: 98.6 F (37 C) 98.3 F (36.8 C) 97.7 F (36.5 C) (!) 97.5 F (36.4 C)  TempSrc: Oral Oral Oral Oral  SpO2: 93% 96% 96% 98%  Weight:   102.4 kg   Height:         Intake/Output Summary (Last 24 hours) at 05/24/2020 1059 Last data filed at 05/24/2020 0900 Gross per 24 hour  Intake 920.9 ml  Output 1900 ml  Net -979.1 ml    Last 3 Weights 05/24/2020 05/23/2020 05/22/2020  Weight (lbs) 225 lb 11.2 oz 227 lb 14.4 oz 227 lb 14.4 oz  Weight (kg) 102.377 kg 103.375 kg 103.375 kg    Body mass index is 35.35 kg/m.  General: Well nourished, well developed, in no acute distress Head: Atraumatic, normal size  Eyes: PEERLA, EOMI  Neck: Supple, no JVD Endocrine: No thryomegaly Cardiac: Normal S1, S2; RRR; no murmurs, rubs, or gallops Lungs: Clear to auscultation bilaterally, no wheezing, rhonchi or rales  Abd: Soft, nontender, no hepatomegaly  Ext: No edema,  pulses 2+, left radial cath site clean and dry without hematoma  Musculoskeletal: No deformities, BUE and BLE strength normal and equal Skin: Warm and dry, no rashes   Neuro: Alert and oriented to person, place, time, and situation, CNII-XII grossly intact, no focal deficits  Psych: Normal mood and affect   Labs  High Sensitivity Troponin:   Recent Labs  Lab 05/22/20 2122 05/23/20 0542  TROPONINIHS 619* 1,404*     Cardiac EnzymesNo results for input(s): TROPONINI in the last 168 hours. No results for input(s): TROPIPOC in the last 168 hours.  Chemistry Recent Labs  Lab 05/22/20 2122 05/24/20 0729  NA 138 136  K 3.9 3.7  CL 104 102  CO2 23 25  GLUCOSE 232* 268*  BUN 13 9  CREATININE 0.75 0.78  CALCIUM 8.7* 9.1  GFRNONAA >60 >60  GFRAA >60 >60  ANIONGAP 11 9    Hematology Recent Labs  Lab 05/22/20 2122 05/23/20 0542 05/24/20 0729  WBC 8.8 7.9 7.2  RBC 3.60*  3.55* 3.62*  HGB 10.1* 10.0* 10.4*  HCT 31.1* 30.6* 31.2*  MCV 86.4 86.2 86.2  MCH 28.1 28.2 28.7  MCHC 32.5 32.7 33.3  RDW 14.0 14.1 14.2  PLT 271 231 260   BNPNo results for input(s): BNP, PROBNP in the last 168 hours.  DDimer No results for input(s): DDIMER in the last 168 hours.   Radiology  CARDIAC CATHETERIZATION  Result Date: 05/23/2020 Conclusions: 1. Severe native coronary artery disease, including 80-90% mid LMCA stenosis, occluded mid LAD, diffusely disease LCx with occlusion of OM1, and 80% in-stent restenosis of the mid/distal RCA followed by 70% distal RCA and rPL lesions, as well as occlusion of the ostial rPDA. 2. Patent LIMA-LAD. 3. Occluded SVG-D2, SVG-OM1, and SVG-rPDA. 4. Upper normal left ventricular filling pressure. 5. Small left radial artery with significant vasospasm.  Recommend alternate access for further catheterizations. Recommendations: 1. Staged PCI to mid/distal RCA and mid LMCA (likely Wednesday if renal function remains stable).  Recommend femoral approach, given significant left radial vasospasm with 7F diagnostic catheters. 2. Continue dual antiplatelet therapy with aspirin and clopidogrel for at least 12 months, ideally longer. 3. Aggressive secondary prevention, including escalation of statin therapy. Nelva Bush, MD [facility]   ECHOCARDIOGRAM COMPLETE  Result Date: 05/23/2020    ECHOCARDIOGRAM REPORT   Patient Name:   Jasmine Richardson Date of Exam: 05/23/2020 Medical Rec #:  [id number]     Height:       67.0 in Accession #:    [id number]    Weight:       227.9 lb Date of Birth:  1961/10/19    BSA:          2.138 m Patient Age:    59 years      BP:           141/81 mmHg Patient Gender: F             HR:           77 bpm. Exam Location:  Inpatient Procedure: 2D Echo and Intracardiac Opacification Agent Indications:    Acute myocardial infarction 410  History:        Patient has no prior history of Echocardiogram examinations.                 CAD, Prior CABG,  Arrythmias:Sinus tachycardia,                 Signs/Symptoms:Chest Pain; Risk Factors:Hypertension,  Dyslipidemia and Diabetes. Emphysema lung.  Sonographer:    Darlina Sicilian RDCS Referring Phys: [id number] ADAM S BARNETT IMPRESSIONS  1. Left ventricular ejection fraction, by estimation, is 50 to 55%. The left ventricle has low normal function. The left ventricle demonstrates regional wall motion abnormalities (see scoring diagram/findings for description). There is mild left ventricular hypertrophy. Left ventricular diastolic parameters are consistent with Grade I diastolic dysfunction (impaired relaxation). Elevated left ventricular end-diastolic pressure. There is moderate dyskinesis of the left ventricular, mid-apical inferoseptal wall.  2. Right ventricular systolic function is mildly reduced. The right ventricular size is normal.  3. The mitral valve is abnormal. Mild mitral valve regurgitation.  4. The aortic valve is tricuspid. Aortic valve regurgitation is not visualized. Mild aortic valve sclerosis is present, with no evidence of aortic valve stenosis. FINDINGS  Left Ventricle: Left ventricular ejection fraction, by estimation, is 50 to 55%. The left ventricle has low normal function. The left ventricle demonstrates regional wall motion abnormalities. Moderate dyskinesis of the left ventricular, mid-apical inferoseptal wall. Definity contrast agent was given IV to delineate the left ventricular endocardial borders. The left ventricular internal cavity size was normal in size. There is mild left ventricular hypertrophy. Left ventricular diastolic parameters  are consistent with Grade I diastolic dysfunction (impaired relaxation). Elevated left ventricular end-diastolic pressure. Right Ventricle: The right ventricular size is normal. No increase in right ventricular wall thickness. Right ventricular systolic function is mildly reduced. Left Atrium: Left atrial size was normal in size. Right  Atrium: Right atrial size was normal in size. Pericardium: There is no evidence of pericardial effusion. Mitral Valve: The mitral valve is abnormal. There is mild thickening of the mitral valve leaflet(s). Mild mitral annular calcification. Mild mitral valve regurgitation. Tricuspid Valve: The tricuspid valve is grossly normal. Tricuspid valve regurgitation is not demonstrated. Aortic Valve: The aortic valve is tricuspid. Aortic valve regurgitation is not visualized. Mild aortic valve sclerosis is present, with no evidence of aortic valve stenosis. Pulmonic Valve: The pulmonic valve was grossly normal. Pulmonic valve regurgitation is trivial. Aorta: The aortic root and ascending aorta are structurally normal, with no evidence of dilitation. Venous: The inferior vena cava was not well visualized. IAS/Shunts: No atrial level shunt detected by color flow Doppler.  LEFT VENTRICLE PLAX 2D LVIDd:         5.00 cm      Diastology LVIDs:         4.20 cm      LV e' lateral:   7.62 cm/s LV PW:         0.90 cm      LV E/e' lateral: 10.8 LV IVS:        1.10 cm      LV e' medial:    3.92 cm/s LVOT diam:     2.50 cm      LV E/e' medial:  20.9 LV SV:         81 LV SV Index:   38 LVOT Area:     4.91 cm  LV Volumes (MOD) LV vol d, MOD A2C: 126.0 ml LV vol d, MOD A4C: 119.0 ml LV vol s, MOD A2C: 61.8 ml LV vol s, MOD A4C: 53.7 ml LV SV MOD A2C:     64.2 ml LV SV MOD A4C:     119.0 ml LV SV MOD BP:      66.5 ml RIGHT VENTRICLE RV S prime:     7.07 cm/s TAPSE (M-mode): 1.4 cm LEFT ATRIUM  Index       RIGHT ATRIUM           Index LA diam:        4.60 cm 2.15 cm/m  RA Area:     17.30 cm LA Vol (A2C):   60.3 ml 28.21 ml/m RA Volume:   45.50 ml  21.28 ml/m LA Vol (A4C):   36.3 ml 16.98 ml/m LA Biplane Vol: 49.6 ml 23.20 ml/m  AORTIC VALVE LVOT Vmax:   107.00 cm/s LVOT Vmean:  63.000 cm/s LVOT VTI:    0.164 m  AORTA Ao Root diam: 3.10 cm MITRAL VALVE MV Area (PHT): 4.80 cm     SHUNTS MV Decel Time: 158 msec     Systemic  VTI:  0.16 m MV E velocity: 82.00 cm/s   Systemic Diam: 2.50 cm MV A velocity: 108.00 cm/s MV E/A ratio:  0.76 Lyman Bishop MD Electronically signed by Lyman Bishop MD Signature Date/Time: 05/23/2020/2:25:21 PM    Final     Cardiac Studies  LHC 05/23/2020 Conclusions: 1. Severe native coronary artery disease, including 80-90% mid LMCA stenosis, occluded mid LAD, diffusely disease LCx with occlusion of OM1, and 80% in-stent restenosis of the mid/distal RCA followed by 70% distal RCA and rPL lesions, as well as occlusion of the ostial rPDA. 2. Patent LIMA-LAD. 3. Occluded SVG-D2, SVG-OM1, and SVG-rPDA. 4. Upper normal left ventricular filling pressure. 5. Small left radial artery with significant vasospasm.  Recommend alternate access for further catheterizations.  Recommendations: 1. Staged PCI to mid/distal RCA and mid LMCA (likely Wednesday if renal function remains stable).  Recommend femoral approach, given significant left radial vasospasm with 78F diagnostic catheters. 2. Continue dual antiplatelet therapy with aspirin and clopidogrel for at least 12 months, ideally longer. 3. Aggressive secondary prevention, including escalation of statin therapy.  TTE 05/23/2020 1. Left ventricular ejection fraction, by estimation, is 50 to 55%. The  left ventricle has low normal function. The left ventricle demonstrates  regional wall motion abnormalities (see scoring diagram/findings for  description). There is mild left  ventricular hypertrophy. Left ventricular diastolic parameters are  consistent with Grade I diastolic dysfunction (impaired relaxation).  Elevated left ventricular end-diastolic pressure. There is moderate  dyskinesis of the left ventricular, mid-apical  inferoseptal wall.  2. Right ventricular systolic function is mildly reduced. The right  ventricular size is normal.  3. The mitral valve is abnormal. Mild mitral valve regurgitation.  4. The aortic valve is tricuspid. Aortic  valve regurgitation is not  visualized. Mild aortic valve sclerosis is present, with no evidence of  aortic valve stenosis.    Patient Profile  Jasmine Richardson is a 59 y.o. female with CAD s/p CABG, DM, HTN, HFpEF admitted 05/22/2020 for NSTEMI.   Assessment & Plan   1. NSTEMI/CAD s/p CABG -patent LIMA-LAD. Occluded SVG-D2/OM1/PDA. Severe native CAD. Interventional recommended staged PCI to mid/distal RCA and LM. Will plan for this tomorrow.  -will remain on heparin for now for 48 hours (stop tomorrow) -continue ASA/plavix -continue metoprolol succinate 150 mg QD  -reported statin intolerance, but we have re-challenged lipitor. We will see if she tolerates this.   2. HLD -lipitor 80 mg QHS -vascepa added for HTG  3. DM -A1c 10.1  -SSI while in house -needs aggressive control as outpatient   4. HFpEF/HTN -continue metoprolol, remipril, lasix 40 mg QD  -LVEDP 15 mmHG  FEN -NPO at midnight -pre cath IVF -code: full  -DVT ppx: heparin   For questions or updates, please contact  [facility] Please consult [email or web address] for contact info under   Time Spent with Patient: I have spent a total of 25 minutes with patient reviewing hospital notes, telemetry, EKGs, labs and examining the patient as well as establishing an assessment and plan that was discussed with the patient.  > 50% of time was spent in direct patient care.    Signed, Addison Naegeli. Audie Box, [place]  05/24/2020 10:59 AM

## 2020-05-24 NOTE — Progress Notes (Signed)
[place] for Heparin Indication: chest pain/ACS  Allergies  Allergen Reactions  . Tramadol     Hallucinations  . Codeine Rash    Patient Measurements: Height: 5\' 7"  (170.2 cm) Weight: 102.4 kg (225 lb 11.2 oz) IBW/kg (Calculated) : 61.6  Ht: 67 in    Wt: 103.9 kg IBW: 61.6 kg Heparin Dosing Weight: 85 kg  Vital Signs: Temp: 97.5 F (36.4 C) (06/08 0759) Temp Source: Oral (06/08 0759) BP: 147/84 (06/08 0759) Pulse Rate: 82 (06/08 0759)  Labs: Recent Labs    05/22/20 2122 05/22/20 2122 05/23/20 0542 05/24/20 0729  HGB 10.1*   < > 10.0* 10.4*  HCT 31.1*  --  30.6* 31.2*  PLT 271  --  231 260  APTT 31  --   --   --   LABPROT 13.7  --   --   --   INR 1.1  --   --   --   HEPARINUNFRC  --   --  0.25* 0.31  CREATININE 0.75  --   --  0.78  TROPONINIHS 619*  --  1,404*  --    < > = values in this interval not displayed.    Estimated Creatinine Clearance: 93.1 mL/min (by C-G formula based on SCr of 0.78 mg/dL).    Assessment: 59 y.o. F presented to [facility] with CP now s/p LHC with plans for staged PCI. Pt remains on IV heparin, heparin level therapeutic and CBC stable.  Goal of Therapy:  Heparin level 0.3-0.7 units/ml Monitor platelets by anticoagulation protocol: Yes   Plan:  -Increase heparin to 1500 units/h -Daily heparin level and CBC   Arrie Senate, PharmD, BCPS Clinical Pharmacist [phone] Please check AMION for all [place] numbers 05/24/2020

## 2020-05-24 NOTE — Telephone Encounter (Signed)
Have her son remove the dressing I put on last week and apply dry guaze, kerlix wrap, and ace bandage to her foot until we can get her back on my schedule next week for follow up.  Thanks Dr. Cannon Kettle

## 2020-05-24 NOTE — Telephone Encounter (Signed)
Pt is admitted at [facility] . Pt is having a Cath done tomorrow and will not make appt on Wednesday. What do you want her do to as far as a dressing change.Please Advise.    Call back [phone]

## 2020-05-25 ENCOUNTER — Encounter: Payer: Commercial Managed Care - PPO | Admitting: Sports Medicine

## 2020-05-25 ENCOUNTER — Encounter (HOSPITAL_COMMUNITY)
Admission: RE | Disposition: A | Discharge status: Home / Self Care | Payer: Self-pay | Source: Other Acute Inpatient Hospital | Attending: Cardiology

## 2020-05-25 HISTORY — PX: CORONARY STENT INTERVENTION: CATH118234

## 2020-05-25 LAB — CBC
HCT: 29.3 % — ABNORMAL LOW (ref 36.0–46.0)
Hemoglobin: 9.6 g/dL — ABNORMAL LOW (ref 12.0–15.0)
MCH: 27.9 pg (ref 26.0–34.0)
MCHC: 32.8 g/dL (ref 30.0–36.0)
MCV: 85.2 fL (ref 80.0–100.0)
Platelets: 245 10*3/uL (ref 150–400)
RBC: 3.44 MIL/uL — ABNORMAL LOW (ref 3.87–5.11)
RDW: 14.3 % (ref 11.5–15.5)
WBC: 6.3 10*3/uL (ref 4.0–10.5)
nRBC: 0 % (ref 0.0–0.2)

## 2020-05-25 LAB — GLUCOSE, CAPILLARY
Glucose-Capillary: 297 mg/dL — ABNORMAL HIGH (ref 70–99)
Glucose-Capillary: 285 mg/dL — ABNORMAL HIGH (ref 70–99)
Glucose-Capillary: 227 mg/dL — ABNORMAL HIGH (ref 70–99)
Glucose-Capillary: 237 mg/dL — ABNORMAL HIGH (ref 70–99)

## 2020-05-25 LAB — BASIC METABOLIC PANEL
Anion gap: 9 (ref 5–15)
BUN: 11 mg/dL (ref 6–20)
CO2: 24 mmol/L (ref 22–32)
Calcium: 8.6 mg/dL — ABNORMAL LOW (ref 8.9–10.3)
Chloride: 104 mmol/L (ref 98–111)
Creatinine, Ser: 0.71 mg/dL (ref 0.44–1.00)
GFR calc Af Amer: >60 mL/min (ref >60)
GFR calc non Af Amer: >60 mL/min (ref >60)
Glucose, Bld: 310 mg/dL — ABNORMAL HIGH (ref 70–99)
Potassium: 3.4 mmol/L — ABNORMAL LOW (ref 3.5–5.1)
Sodium: 137 mmol/L (ref 135–145)

## 2020-05-25 LAB — HEPARIN LEVEL (UNFRACTIONATED): Heparin Unfractionated: 0.44 IU/mL (ref 0.30–0.70)

## 2020-05-25 LAB — POCT ACTIVATED CLOTTING TIME: Activated Clotting Time: 747 seconds

## 2020-05-25 SURGERY — CORONARY STENT INTERVENTION
Anesthesia: LOCAL

## 2020-05-25 MED ORDER — SODIUM CHLORIDE 0.9 % WEIGHT BASED INFUSION
1.0000 mL/kg/h | INTRAVENOUS | Status: AC
  Administered 2020-05-25 (×2): 1 mL/kg/h via INTRAVENOUS

## 2020-05-25 MED ORDER — SODIUM CHLORIDE 0.9 % IV SOLN: 250.0000 mL | INTRAVENOUS | Status: DC | PRN

## 2020-05-25 MED ORDER — HEPARIN (PORCINE) IN NACL 1000-0.9 UT/500ML-% IV SOLN
INTRAVENOUS | Status: AC
  Filled 2020-05-25: qty 1000

## 2020-05-25 MED ORDER — FENTANYL CITRATE (PF) 100 MCG/2ML IJ SOLN
INTRAMUSCULAR | Status: AC
  Filled 2020-05-25: qty 2

## 2020-05-25 MED ORDER — BIVALIRUDIN TRIFLUOROACETATE 250 MG IV SOLR
INTRAVENOUS | Status: AC
  Filled 2020-05-25: qty 250

## 2020-05-25 MED ORDER — BIVALIRUDIN BOLUS VIA INFUSION - CUPID
INTRAVENOUS | Status: DC | PRN
  Administered 2020-05-25: 76.35 mg via INTRAVENOUS

## 2020-05-25 MED ORDER — HEPARIN (PORCINE) IN NACL 1000-0.9 UT/500ML-% IV SOLN
INTRAVENOUS | Status: DC | PRN
  Administered 2020-05-25 (×2): 500 mL

## 2020-05-25 MED ORDER — IOHEXOL 350 MG/ML SOLN
INTRAVENOUS | Status: AC
  Filled 2020-05-25: qty 1

## 2020-05-25 MED ORDER — INSULIN GLARGINE 100 UNIT/ML ~~LOC~~ SOLN
25.0000 [IU] | Freq: Every day | SUBCUTANEOUS | Status: DC
  Administered 2020-05-25: 25 [IU] via SUBCUTANEOUS
  Filled 2020-05-25 (×2): qty 0.25

## 2020-05-25 MED ORDER — POTASSIUM CHLORIDE CRYS ER 20 MEQ PO TBCR
40.0000 meq | EXTENDED_RELEASE_TABLET | Freq: Once | ORAL | Status: AC
  Administered 2020-05-25: 40 meq via ORAL
  Filled 2020-05-25: qty 2

## 2020-05-25 MED ORDER — LIDOCAINE HCL (PF) 1 % IJ SOLN
INTRAMUSCULAR | Status: DC | PRN
  Administered 2020-05-25: 5 mL via INTRADERMAL
  Administered 2020-05-25: 8 mL via INTRADERMAL

## 2020-05-25 MED ORDER — LABETALOL HCL 5 MG/ML IV SOLN: 10.0000 mg | INTRAVENOUS | Status: AC | PRN

## 2020-05-25 MED ORDER — MIDAZOLAM HCL 2 MG/2ML IJ SOLN
INTRAMUSCULAR | Status: AC
  Filled 2020-05-25: qty 2

## 2020-05-25 MED ORDER — SODIUM CHLORIDE 0.9% FLUSH: 3.0000 mL | INTRAVENOUS | Status: DC | PRN

## 2020-05-25 MED ORDER — MIDAZOLAM HCL 2 MG/2ML IJ SOLN
INTRAMUSCULAR | Status: DC | PRN
  Administered 2020-05-25 (×4): 1 mg via INTRAVENOUS

## 2020-05-25 MED ORDER — IOHEXOL 350 MG/ML SOLN
INTRAVENOUS | Status: DC | PRN
  Administered 2020-05-25: 110 mL via INTRA_ARTERIAL

## 2020-05-25 MED ORDER — SODIUM CHLORIDE 0.9 % IV SOLN
INTRAVENOUS | Status: DC | PRN
  Administered 2020-05-25 (×2): 1.75 mg/kg/h via INTRAVENOUS

## 2020-05-25 MED ORDER — ENOXAPARIN SODIUM 40 MG/0.4ML ~~LOC~~ SOLN: 40.0000 mg | SUBCUTANEOUS | Status: DC

## 2020-05-25 MED ORDER — FENTANYL CITRATE (PF) 100 MCG/2ML IJ SOLN
INTRAMUSCULAR | Status: DC | PRN
  Administered 2020-05-25 (×2): 50 ug via INTRAVENOUS
  Administered 2020-05-25: 25 ug via INTRAVENOUS

## 2020-05-25 MED ORDER — LISINOPRIL 20 MG PO TABS
20.0000 mg | ORAL_TABLET | Freq: Every day | ORAL | Status: DC
  Administered 2020-05-26: 20 mg via ORAL
  Filled 2020-05-25: qty 1

## 2020-05-25 MED ORDER — SODIUM CHLORIDE 0.9% FLUSH: 3.0000 mL | Freq: Two times a day (BID) | INTRAVENOUS | Status: DC

## 2020-05-25 MED ORDER — LIDOCAINE HCL (PF) 1 % IJ SOLN
INTRAMUSCULAR | Status: AC
  Filled 2020-05-25: qty 30

## 2020-05-25 MED ORDER — NITROGLYCERIN 1 MG/10 ML FOR IR/CATH LAB
INTRA_ARTERIAL | Status: AC
  Filled 2020-05-25: qty 10

## 2020-05-25 MED ORDER — NITROGLYCERIN 1 MG/10 ML FOR IR/CATH LAB
INTRA_ARTERIAL | Status: DC | PRN
  Administered 2020-05-25 (×2): 200 ug via INTRACORONARY

## 2020-05-25 SURGICAL SUPPLY — 22 items
BALLN EMERGE MR 2.5X12 (BALLOONS) ×4
BALLN SAPPHIRE ~~LOC~~ 3.0X12 (BALLOONS) ×1 IMPLANT
BALLN ~~LOC~~ EMERGE MR 3.0X30 (BALLOONS) ×2
BALLOON EMERGE MR 2.5X12 (BALLOONS) IMPLANT
BALLOON ~~LOC~~ EMERGE MR 3.0X30 (BALLOONS) IMPLANT
CATH LAUNCHER 6FR JR4 (CATHETERS) ×1 IMPLANT
CATH TELESCOPE 6F GEC (CATHETERS) ×1 IMPLANT
DEVICE CLOSURE PERCLS PRGLD 6F (VASCULAR PRODUCTS) IMPLANT
ELECT DEFIB PAD ADLT CADENCE (PAD) ×1 IMPLANT
HOVERMATT SINGLE USE (MISCELLANEOUS) ×1 IMPLANT
KIT ENCORE 26 ADVANTAGE (KITS) ×1 IMPLANT
KIT HEART LEFT (KITS) ×2 IMPLANT
KIT MICROPUNCTURE NIT STIFF (SHEATH) ×1 IMPLANT
PACK CARDIAC CATHETERIZATION (CUSTOM PROCEDURE TRAY) ×2 IMPLANT
PERCLOSE PROGLIDE 6F (VASCULAR PRODUCTS) ×2
SHEATH PINNACLE 6F 10CM (SHEATH) ×1 IMPLANT
SHEATH PROBE COVER 6X72 (BAG) ×1 IMPLANT
STENT RESOLUTE ONYX 2.75X38 (Permanent Stent) ×1 IMPLANT
TRANSDUCER W/STOPCOCK (MISCELLANEOUS) ×2 IMPLANT
TUBING CIL FLEX 10 FLL-RA (TUBING) ×2 IMPLANT
WIRE EMERALD 3MM-J .035X150CM (WIRE) ×1 IMPLANT
WIRE RUNTHROUGH .014X180CM (WIRE) ×1 IMPLANT

## 2020-05-25 NOTE — TOC Benefit Eligibility Note (Signed)
Transition of Care [facility]) Benefit Eligibility Note    Patient Details  Name: Jasmine Richardson MRN: [id number] Date of Birth: Jul 18, 1961   Medication/Dose: VASCEPA 2 GRAMS BID  Covered?: Yes  Tier: (?)  Prescription Coverage Preferred Pharmacy: [facility] with Person/Company/Phone Number:: Rena S. W[employer] [phone]  Co-Pay: $35.00  bid for 30 day supply.  Prior Approval: [place]([phone])  Deductible: (NO-DEDUCTIBLE)       Shelda Altes Phone Number: 05/25/2020, 2:54 PM

## 2020-05-25 NOTE — Progress Notes (Signed)
Inpatient Diabetes Program Recommendations  AACE/ADA: New Consensus Statement on Inpatient Glycemic Control   Target Ranges:  Prepandial:   less than 140 mg/dL      Peak postprandial:   less than 180 mg/dL (1-2 hours)      Critically ill patients:  140 - 180 mg/dL   Results for Jasmine Richardson, Jasmine Richardson (MRN [id number]) as of 05/25/2020 11:05  Ref. Range 05/24/2020 07:56 05/24/2020 10:59 05/24/2020 16:12 05/24/2020 21:27 05/25/2020 08:07  Glucose-Capillary Latest Ref Range: 70 - 99 mg/dL 243 (H) 282 (H) 278 (H) 338 (H) 297 (H)   Review of Glycemic Control  Diabetes history: DM2 Outpatient Diabetes medications: Metformin 1000 mg BID, Januvia 100 mg QD, Basaglar 20 units QD Current orders for Inpatient glycemic control: Lantus 20 units QHS, Novolog 0-9 units TID with meals  Inpatient Diabetes Program Recommendations:    Insulin: Please consider increasing Lantus to 25 units daily, please consider adding Novolog 0-5 units QHS for bedtime correction, and please consider ordering Novolog 3 units TID with meals for meal coverage if patient eats at least 50% of meals.  Thanks, Barnie Alderman, RN, MSN, CDE Diabetes Coordinator Inpatient Diabetes Program [phone] (Team Pager from 8am to 5pm)

## 2020-05-25 NOTE — Interval H&P Note (Signed)
Cath Lab Visit (complete for each Cath Lab visit)  Clinical Evaluation Leading to the Procedure:   ACS: Yes.   NSTEMI  Non-ACS:  n/a    History and Physical Interval Note:  05/25/2020 2:49 PM  Jasmine Richardson  has presented today for surgery, with the diagnosis of cad.  The various methods of treatment have been discussed with the patient and family. After consideration of risks, benefits and other options for treatment, the patient has consented to  Procedure(s): CORONARY STENT INTERVENTION (N/A) as a surgical intervention.  The patient's history has been reviewed, patient examined, no change in status, stable for surgery.  I have reviewed the patient's chart and labs.  Questions were answered to the patient's satisfaction.     Kathlyn Sacramento

## 2020-05-25 NOTE — Care Management (Signed)
05-25-20 Benefits check submitted for [employer]. Case Manager will follow for cost. Graves-Bigelow, Ocie Cornfield, RN,BSN Case Manager

## 2020-05-25 NOTE — H&P (View-Only) (Signed)
Progress Note  Patient Name: Jasmine Richardson Date of Encounter: 05/25/2020  [facility] Cardiologist: Candee Furbish, MD   Subjective   Plan for staged PCI today. No chest pain overnight. Breathing stable.   Inpatient Medications    Scheduled Meds: . aspirin EC  81 mg Oral Daily  . atorvastatin  80 mg Oral Daily  . clopidogrel  75 mg Oral Daily  . furosemide  40 mg Oral Daily  . icosapent Ethyl  2 g Oral BID  . insulin aspart  0-9 Units Subcutaneous TID WC  . insulin glargine  20 Units Subcutaneous QHS  . [START ON 05/26/2020] lisinopril  20 mg Oral Daily  . metoprolol succinate  150 mg Oral Daily  . pantoprazole  40 mg Oral Daily  . sodium chloride flush  3 mL Intravenous Q12H  . sodium chloride flush  3 mL Intravenous Q12H  . sodium chloride flush  3 mL Intravenous Q12H   Continuous Infusions: . sodium chloride    . sodium chloride    . sodium chloride 1 mL/kg/hr (05/25/20 0512)   PRN Meds: sodium chloride, sodium chloride, acetaminophen, nitroGLYCERIN, ondansetron (ZOFRAN) IV, sodium chloride flush, sodium chloride flush   Vital Signs    Vitals:   05/24/20 1109 05/24/20 1614 05/24/20 2004 05/25/20 0408  BP: 131/72 136/82 (!) 144/74 (!) 153/91  Pulse: 74 72 80 95  Resp:  18 18   Temp: 97.6 F (36.4 C) 97.9 F (36.6 C) 97.6 F (36.4 C) (!) 97.5 F (36.4 C)  TempSrc: Oral Oral Oral Oral  SpO2: 98% 99% 93% 98%  Weight:    101.8 kg  Height:        Intake/Output Summary (Last 24 hours) at 05/25/2020 0726 Last data filed at 05/24/2020 1832 Gross per 24 hour  Intake 783.29 ml  Output --  Net 783.29 ml   Last 3 Weights 05/25/2020 05/24/2020 05/23/2020  Weight (lbs) 224 lb 8 oz 225 lb 11.2 oz 227 lb 14.4 oz  Weight (kg) 101.833 kg 102.377 kg 103.375 kg      Telemetry    NSR, HR 70s, transient first degree block,  - Personally Reviewed  ECG    No new - Personally Reviewed  Physical Exam   GEN: No acute distress.   Neck: No JVD Cardiac: RRR, no murmurs, rubs,  or gallops.  Respiratory: Clear to auscultation bilaterally. GI: Soft, nontender, non-distended  MS: No edema; No deformity. Neuro:  Nonfocal  Psych: Normal affect   Labs    High Sensitivity Troponin:   Recent Labs  Lab 05/22/20 2122 05/23/20 0542  TROPONINIHS 619* 1,404*      Chemistry Recent Labs  Lab 05/22/20 2122 05/24/20 0729 05/25/20 0516  NA 138 136 137  K 3.9 3.7 3.4*  CL 104 102 104  CO2 23 25 24   GLUCOSE 232* 268* 310*  BUN 13 9 11   CREATININE 0.75 0.78 0.71  CALCIUM 8.7* 9.1 8.6*  GFRNONAA >60 >60 >60  GFRAA >60 >60 >60  ANIONGAP 11 9 9      Hematology Recent Labs  Lab 05/23/20 0542 05/24/20 0729 05/25/20 0516  WBC 7.9 7.2 6.3  RBC 3.55* 3.62* 3.44*  HGB 10.0* 10.4* 9.6*  HCT 30.6* 31.2* 29.3*  MCV 86.2 86.2 85.2  MCH 28.2 28.7 27.9  MCHC 32.7 33.3 32.8  RDW 14.1 14.2 14.3  PLT 231 260 245    BNPNo results for input(s): BNP, PROBNP in the last 168 hours.   DDimer No results for input(s):  DDIMER in the last 168 hours.   Radiology    CARDIAC CATHETERIZATION  Result Date: 05/23/2020 Conclusions: 1. Severe native coronary artery disease, including 80-90% mid LMCA stenosis, occluded mid LAD, diffusely disease LCx with occlusion of OM1, and 80% in-stent restenosis of the mid/distal RCA followed by 70% distal RCA and rPL lesions, as well as occlusion of the ostial rPDA. 2. Patent LIMA-LAD. 3. Occluded SVG-D2, SVG-OM1, and SVG-rPDA. 4. Upper normal left ventricular filling pressure. 5. Small left radial artery with significant vasospasm.  Recommend alternate access for further catheterizations. Recommendations: 1. Staged PCI to mid/distal RCA and mid LMCA (likely Wednesday if renal function remains stable).  Recommend femoral approach, given significant left radial vasospasm with 13F diagnostic catheters. 2. Continue dual antiplatelet therapy with aspirin and clopidogrel for at least 12 months, ideally longer. 3. Aggressive secondary prevention, including  escalation of statin therapy. Nelva Bush, MD [facility]   ECHOCARDIOGRAM COMPLETE  Result Date: 05/23/2020    ECHOCARDIOGRAM REPORT   Patient Name:   Jasmine Richardson Date of Exam: 05/23/2020 Medical Rec #:  [id number]     Height:       67.0 in Accession #:    [id number]    Weight:       227.9 lb Date of Birth:  03-24-1961    BSA:          2.138 m Patient Age:    59 years      BP:           141/81 mmHg Patient Gender: F             HR:           77 bpm. Exam Location:  Inpatient Procedure: 2D Echo and Intracardiac Opacification Agent Indications:    Acute myocardial infarction 410  History:        Patient has no prior history of Echocardiogram examinations.                 CAD, Prior CABG, Arrythmias:Sinus tachycardia,                 Signs/Symptoms:Chest Pain; Risk Factors:Hypertension,                 Dyslipidemia and Diabetes. Emphysema lung.  Sonographer:    Darlina Sicilian RDCS Referring Phys: [phone] ADAM S BARNETT IMPRESSIONS  1. Left ventricular ejection fraction, by estimation, is 50 to 55%. The left ventricle has low normal function. The left ventricle demonstrates regional wall motion abnormalities (see scoring diagram/findings for description). There is mild left ventricular hypertrophy. Left ventricular diastolic parameters are consistent with Grade I diastolic dysfunction (impaired relaxation). Elevated left ventricular end-diastolic pressure. There is moderate dyskinesis of the left ventricular, mid-apical inferoseptal wall.  2. Right ventricular systolic function is mildly reduced. The right ventricular size is normal.  3. The mitral valve is abnormal. Mild mitral valve regurgitation.  4. The aortic valve is tricuspid. Aortic valve regurgitation is not visualized. Mild aortic valve sclerosis is present, with no evidence of aortic valve stenosis. FINDINGS  Left Ventricle: Left ventricular ejection fraction, by estimation, is 50 to 55%. The left ventricle has low normal function. The left  ventricle demonstrates regional wall motion abnormalities. Moderate dyskinesis of the left ventricular, mid-apical inferoseptal wall. Definity contrast agent was given IV to delineate the left ventricular endocardial borders. The left ventricular internal cavity size was normal in size. There is mild left ventricular hypertrophy. Left ventricular diastolic parameters  are consistent with Grade I  diastolic dysfunction (impaired relaxation). Elevated left ventricular end-diastolic pressure. Right Ventricle: The right ventricular size is normal. No increase in right ventricular wall thickness. Right ventricular systolic function is mildly reduced. Left Atrium: Left atrial size was normal in size. Right Atrium: Right atrial size was normal in size. Pericardium: There is no evidence of pericardial effusion. Mitral Valve: The mitral valve is abnormal. There is mild thickening of the mitral valve leaflet(s). Mild mitral annular calcification. Mild mitral valve regurgitation. Tricuspid Valve: The tricuspid valve is grossly normal. Tricuspid valve regurgitation is not demonstrated. Aortic Valve: The aortic valve is tricuspid. Aortic valve regurgitation is not visualized. Mild aortic valve sclerosis is present, with no evidence of aortic valve stenosis. Pulmonic Valve: The pulmonic valve was grossly normal. Pulmonic valve regurgitation is trivial. Aorta: The aortic root and ascending aorta are structurally normal, with no evidence of dilitation. Venous: The inferior vena cava was not well visualized. IAS/Shunts: No atrial level shunt detected by color flow Doppler.  LEFT VENTRICLE PLAX 2D LVIDd:         5.00 cm      Diastology LVIDs:         4.20 cm      LV e' lateral:   7.62 cm/s LV PW:         0.90 cm      LV E/e' lateral: 10.8 LV IVS:        1.10 cm      LV e' medial:    3.92 cm/s LVOT diam:     2.50 cm      LV E/e' medial:  20.9 LV SV:         81 LV SV Index:   38 LVOT Area:     4.91 cm  LV Volumes (MOD) LV vol d, MOD  A2C: 126.0 ml LV vol d, MOD A4C: 119.0 ml LV vol s, MOD A2C: 61.8 ml LV vol s, MOD A4C: 53.7 ml LV SV MOD A2C:     64.2 ml LV SV MOD A4C:     119.0 ml LV SV MOD BP:      66.5 ml RIGHT VENTRICLE RV S prime:     7.07 cm/s TAPSE (M-mode): 1.4 cm LEFT ATRIUM             Index       RIGHT ATRIUM           Index LA diam:        4.60 cm 2.15 cm/m  RA Area:     17.30 cm LA Vol (A2C):   60.3 ml 28.21 ml/m RA Volume:   45.50 ml  21.28 ml/m LA Vol (A4C):   36.3 ml 16.98 ml/m LA Biplane Vol: 49.6 ml 23.20 ml/m  AORTIC VALVE LVOT Vmax:   107.00 cm/s LVOT Vmean:  63.000 cm/s LVOT VTI:    0.164 m  AORTA Ao Root diam: 3.10 cm MITRAL VALVE MV Area (PHT): 4.80 cm     SHUNTS MV Decel Time: 158 msec     Systemic VTI:  0.16 m MV E velocity: 82.00 cm/s   Systemic Diam: 2.50 cm MV A velocity: 108.00 cm/s MV E/A ratio:  0.76 Lyman Bishop MD Electronically signed by Lyman Bishop MD Signature Date/Time: 05/23/2020/2:25:21 PM    Final     Cardiac Studies   LHC 05/23/2020 Conclusions: 1. Severe native coronary artery disease, including 80-90% mid LMCA stenosis, occluded mid LAD, diffusely disease LCx with occlusion of OM1, and 80% in-stent restenosis of the  mid/distal RCA followed by 70% distal RCA and rPL lesions, as well as occlusion of the ostial rPDA. 2. Patent LIMA-LAD. 3. Occluded SVG-D2, SVG-OM1, and SVG-rPDA. 4. Upper normal left ventricular filling pressure. 5. Small left radial artery with significant vasospasm. Recommend alternate access for further catheterizations.  Recommendations: 1. Staged PCI to mid/distal RCA and mid LMCA (likely Wednesday if renal function remains stable). Recommend femoral approach, given significant left radial vasospasm with 63F diagnostic catheters. 2. Continue dual antiplatelet therapy with aspirin and clopidogrel for at least 12 months, ideally longer. 3. Aggressive secondary prevention, including escalation of statin therapy.  TTE 05/23/2020 1. Left ventricular ejection  fraction, by estimation, is 50 to 55%. The  left ventricle has low normal function. The left ventricle demonstrates  regional wall motion abnormalities (see scoring diagram/findings for  description). There is mild left  ventricular hypertrophy. Left ventricular diastolic parameters are  consistent with Grade I diastolic dysfunction (impaired relaxation).  Elevated left ventricular end-diastolic pressure. There is moderate  dyskinesis of the left ventricular, mid-apical  inferoseptal wall.  2. Right ventricular systolic function is mildly reduced. The right  ventricular size is normal.  3. The mitral valve is abnormal. Mild mitral valve regurgitation.  4. The aortic valve is tricuspid. Aortic valve regurgitation is not  visualized. Mild aortic valve sclerosis is present, with no evidence of  aortic valve stenosis.   Patient Profile     59 y.o. female  with CAD s/p CABG, DM, HTN, HFpEF admitted 05/22/2020 for NSTEMI.   Assessment & Plan    NSTEMI/CAD s/p CABG - patent LIMA-LAD, occluded SVG-D2/OM1/PDA with severe CAD - Plan for staged PCI to mid/distal RCA and LM today - IV heparin stopped this AM - Aspirin and plavix - Metoprolol 150mg  QD - Lipitor started, h/o of statin intolerance - creatinine stable - chest pain free  HTN - metoprolol, lisinopril 20 mg daily, lasix 40 mg daily - Pressures reasonable  HLD - lipitor 80 mg daily and Vascepa - LDL unable to calculate, TG 559, HDL 37  DM - A1C 10.1 - SSI - needs further management per primary as outpatient  Chronic diastolic HF - Echo with EF 50-55%, G1DD, mild MR - lasix 40 mg daily for elevated LVEDP   For questions or updates, please contact [facility] Please consult [email or web address] for contact info under        Signed, Evelene Roussin Ninfa Meeker, PA-C  05/25/2020, 7:26 AM

## 2020-05-25 NOTE — Progress Notes (Signed)
Progress Note  Patient Name: Jasmine Richardson Date of Encounter: 05/25/2020  [facility] Cardiologist: Candee Furbish, MD   Subjective   Plan for staged PCI today. No chest pain overnight. Breathing stable.   Inpatient Medications    Scheduled Meds: . aspirin EC  81 mg Oral Daily  . atorvastatin  80 mg Oral Daily  . clopidogrel  75 mg Oral Daily  . furosemide  40 mg Oral Daily  . icosapent Ethyl  2 g Oral BID  . insulin aspart  0-9 Units Subcutaneous TID WC  . insulin glargine  20 Units Subcutaneous QHS  . [START ON 05/26/2020] lisinopril  20 mg Oral Daily  . metoprolol succinate  150 mg Oral Daily  . pantoprazole  40 mg Oral Daily  . sodium chloride flush  3 mL Intravenous Q12H  . sodium chloride flush  3 mL Intravenous Q12H  . sodium chloride flush  3 mL Intravenous Q12H   Continuous Infusions: . sodium chloride    . sodium chloride    . sodium chloride 1 mL/kg/hr (05/25/20 0512)   PRN Meds: sodium chloride, sodium chloride, acetaminophen, nitroGLYCERIN, ondansetron (ZOFRAN) IV, sodium chloride flush, sodium chloride flush   Vital Signs    Vitals:   05/24/20 1109 05/24/20 1614 05/24/20 2004 05/25/20 0408  BP: 131/72 136/82 (!) 144/74 (!) 153/91  Pulse: 74 72 80 95  Resp:  18 18   Temp: 97.6 F (36.4 C) 97.9 F (36.6 C) 97.6 F (36.4 C) (!) 97.5 F (36.4 C)  TempSrc: Oral Oral Oral Oral  SpO2: 98% 99% 93% 98%  Weight:    101.8 kg  Height:        Intake/Output Summary (Last 24 hours) at 05/25/2020 0726 Last data filed at 05/24/2020 1832 Gross per 24 hour  Intake 783.29 ml  Output --  Net 783.29 ml   Last 3 Weights 05/25/2020 05/24/2020 05/23/2020  Weight (lbs) 224 lb 8 oz 225 lb 11.2 oz 227 lb 14.4 oz  Weight (kg) 101.833 kg 102.377 kg 103.375 kg      Telemetry    NSR, HR 70s, transient first degree block,  - Personally Reviewed  ECG    No new - Personally Reviewed  Physical Exam   GEN: No acute distress.   Neck: No JVD Cardiac: RRR, no murmurs, rubs,  or gallops.  Respiratory: Clear to auscultation bilaterally. GI: Soft, nontender, non-distended  MS: No edema; No deformity. Neuro:  Nonfocal  Psych: Normal affect   Labs    High Sensitivity Troponin:   Recent Labs  Lab 05/22/20 2122 05/23/20 0542  TROPONINIHS 619* 1,404*      Chemistry Recent Labs  Lab 05/22/20 2122 05/24/20 0729 05/25/20 0516  NA 138 136 137  K 3.9 3.7 3.4*  CL 104 102 104  CO2 23 25 24   GLUCOSE 232* 268* 310*  BUN 13 9 11   CREATININE 0.75 0.78 0.71  CALCIUM 8.7* 9.1 8.6*  GFRNONAA >60 >60 >60  GFRAA >60 >60 >60  ANIONGAP 11 9 9      Hematology Recent Labs  Lab 05/23/20 0542 05/24/20 0729 05/25/20 0516  WBC 7.9 7.2 6.3  RBC 3.55* 3.62* 3.44*  HGB 10.0* 10.4* 9.6*  HCT 30.6* 31.2* 29.3*  MCV 86.2 86.2 85.2  MCH 28.2 28.7 27.9  MCHC 32.7 33.3 32.8  RDW 14.1 14.2 14.3  PLT 231 260 245    BNPNo results for input(s): BNP, PROBNP in the last 168 hours.   DDimer No results for input(s):  DDIMER in the last 168 hours.   Radiology    CARDIAC CATHETERIZATION  Result Date: 05/23/2020 Conclusions: 1. Severe native coronary artery disease, including 80-90% mid LMCA stenosis, occluded mid LAD, diffusely disease LCx with occlusion of OM1, and 80% in-stent restenosis of the mid/distal RCA followed by 70% distal RCA and rPL lesions, as well as occlusion of the ostial rPDA. 2. Patent LIMA-LAD. 3. Occluded SVG-D2, SVG-OM1, and SVG-rPDA. 4. Upper normal left ventricular filling pressure. 5. Small left radial artery with significant vasospasm.  Recommend alternate access for further catheterizations. Recommendations: 1. Staged PCI to mid/distal RCA and mid LMCA (likely Wednesday if renal function remains stable).  Recommend femoral approach, given significant left radial vasospasm with 42F diagnostic catheters. 2. Continue dual antiplatelet therapy with aspirin and clopidogrel for at least 12 months, ideally longer. 3. Aggressive secondary prevention, including  escalation of statin therapy. Nelva Bush, MD [facility]   ECHOCARDIOGRAM COMPLETE  Result Date: 05/23/2020    ECHOCARDIOGRAM REPORT   Patient Name:   Jasmine Richardson Date of Exam: 05/23/2020 Medical Rec #:  [id number]     Height:       67.0 in Accession #:    [id number]    Weight:       227.9 lb Date of Birth:  01-Jan-1961    BSA:          2.138 m Patient Age:    59 years      BP:           141/81 mmHg Patient Gender: F             HR:           77 bpm. Exam Location:  Inpatient Procedure: 2D Echo and Intracardiac Opacification Agent Indications:    Acute myocardial infarction 410  History:        Patient has no prior history of Echocardiogram examinations.                 CAD, Prior CABG, Arrythmias:Sinus tachycardia,                 Signs/Symptoms:Chest Pain; Risk Factors:Hypertension,                 Dyslipidemia and Diabetes. Emphysema lung.  Sonographer:    Darlina Sicilian RDCS Referring Phys: [phone] ADAM S BARNETT IMPRESSIONS  1. Left ventricular ejection fraction, by estimation, is 50 to 55%. The left ventricle has low normal function. The left ventricle demonstrates regional wall motion abnormalities (see scoring diagram/findings for description). There is mild left ventricular hypertrophy. Left ventricular diastolic parameters are consistent with Grade I diastolic dysfunction (impaired relaxation). Elevated left ventricular end-diastolic pressure. There is moderate dyskinesis of the left ventricular, mid-apical inferoseptal wall.  2. Right ventricular systolic function is mildly reduced. The right ventricular size is normal.  3. The mitral valve is abnormal. Mild mitral valve regurgitation.  4. The aortic valve is tricuspid. Aortic valve regurgitation is not visualized. Mild aortic valve sclerosis is present, with no evidence of aortic valve stenosis. FINDINGS  Left Ventricle: Left ventricular ejection fraction, by estimation, is 50 to 55%. The left ventricle has low normal function. The left  ventricle demonstrates regional wall motion abnormalities. Moderate dyskinesis of the left ventricular, mid-apical inferoseptal wall. Definity contrast agent was given IV to delineate the left ventricular endocardial borders. The left ventricular internal cavity size was normal in size. There is mild left ventricular hypertrophy. Left ventricular diastolic parameters  are consistent with Grade I  diastolic dysfunction (impaired relaxation). Elevated left ventricular end-diastolic pressure. Right Ventricle: The right ventricular size is normal. No increase in right ventricular wall thickness. Right ventricular systolic function is mildly reduced. Left Atrium: Left atrial size was normal in size. Right Atrium: Right atrial size was normal in size. Pericardium: There is no evidence of pericardial effusion. Mitral Valve: The mitral valve is abnormal. There is mild thickening of the mitral valve leaflet(s). Mild mitral annular calcification. Mild mitral valve regurgitation. Tricuspid Valve: The tricuspid valve is grossly normal. Tricuspid valve regurgitation is not demonstrated. Aortic Valve: The aortic valve is tricuspid. Aortic valve regurgitation is not visualized. Mild aortic valve sclerosis is present, with no evidence of aortic valve stenosis. Pulmonic Valve: The pulmonic valve was grossly normal. Pulmonic valve regurgitation is trivial. Aorta: The aortic root and ascending aorta are structurally normal, with no evidence of dilitation. Venous: The inferior vena cava was not well visualized. IAS/Shunts: No atrial level shunt detected by color flow Doppler.  LEFT VENTRICLE PLAX 2D LVIDd:         5.00 cm      Diastology LVIDs:         4.20 cm      LV e' lateral:   7.62 cm/s LV PW:         0.90 cm      LV E/e' lateral: 10.8 LV IVS:        1.10 cm      LV e' medial:    3.92 cm/s LVOT diam:     2.50 cm      LV E/e' medial:  20.9 LV SV:         81 LV SV Index:   38 LVOT Area:     4.91 cm  LV Volumes (MOD) LV vol d, MOD  A2C: 126.0 ml LV vol d, MOD A4C: 119.0 ml LV vol s, MOD A2C: 61.8 ml LV vol s, MOD A4C: 53.7 ml LV SV MOD A2C:     64.2 ml LV SV MOD A4C:     119.0 ml LV SV MOD BP:      66.5 ml RIGHT VENTRICLE RV S prime:     7.07 cm/s TAPSE (M-mode): 1.4 cm LEFT ATRIUM             Index       RIGHT ATRIUM           Index LA diam:        4.60 cm 2.15 cm/m  RA Area:     17.30 cm LA Vol (A2C):   60.3 ml 28.21 ml/m RA Volume:   45.50 ml  21.28 ml/m LA Vol (A4C):   36.3 ml 16.98 ml/m LA Biplane Vol: 49.6 ml 23.20 ml/m  AORTIC VALVE LVOT Vmax:   107.00 cm/s LVOT Vmean:  63.000 cm/s LVOT VTI:    0.164 m  AORTA Ao Root diam: 3.10 cm MITRAL VALVE MV Area (PHT): 4.80 cm     SHUNTS MV Decel Time: 158 msec     Systemic VTI:  0.16 m MV E velocity: 82.00 cm/s   Systemic Diam: 2.50 cm MV A velocity: 108.00 cm/s MV E/A ratio:  0.76 Lyman Bishop MD Electronically signed by Lyman Bishop MD Signature Date/Time: 05/23/2020/2:25:21 PM    Final     Cardiac Studies   LHC 05/23/2020 Conclusions: 1. Severe native coronary artery disease, including 80-90% mid LMCA stenosis, occluded mid LAD, diffusely disease LCx with occlusion of OM1, and 80% in-stent restenosis of the  mid/distal RCA followed by 70% distal RCA and rPL lesions, as well as occlusion of the ostial rPDA. 2. Patent LIMA-LAD. 3. Occluded SVG-D2, SVG-OM1, and SVG-rPDA. 4. Upper normal left ventricular filling pressure. 5. Small left radial artery with significant vasospasm. Recommend alternate access for further catheterizations.  Recommendations: 1. Staged PCI to mid/distal RCA and mid LMCA (likely Wednesday if renal function remains stable). Recommend femoral approach, given significant left radial vasospasm with 55F diagnostic catheters. 2. Continue dual antiplatelet therapy with aspirin and clopidogrel for at least 12 months, ideally longer. 3. Aggressive secondary prevention, including escalation of statin therapy.  TTE 05/23/2020 1. Left ventricular ejection  fraction, by estimation, is 50 to 55%. The  left ventricle has low normal function. The left ventricle demonstrates  regional wall motion abnormalities (see scoring diagram/findings for  description). There is mild left  ventricular hypertrophy. Left ventricular diastolic parameters are  consistent with Grade I diastolic dysfunction (impaired relaxation).  Elevated left ventricular end-diastolic pressure. There is moderate  dyskinesis of the left ventricular, mid-apical  inferoseptal wall.  2. Right ventricular systolic function is mildly reduced. The right  ventricular size is normal.  3. The mitral valve is abnormal. Mild mitral valve regurgitation.  4. The aortic valve is tricuspid. Aortic valve regurgitation is not  visualized. Mild aortic valve sclerosis is present, with no evidence of  aortic valve stenosis.   Patient Profile     59 y.o. female  with CAD s/p CABG, DM, HTN, HFpEF admitted 05/22/2020 for NSTEMI.   Assessment & Plan    NSTEMI/CAD s/p CABG - patent LIMA-LAD, occluded SVG-D2/OM1/PDA with severe CAD - Plan for staged PCI to mid/distal RCA and LM today - IV heparin stopped this AM - Aspirin and plavix - Metoprolol 150mg  QD - Lipitor started, h/o of statin intolerance - creatinine stable - chest pain free  HTN - metoprolol, lisinopril 20 mg daily, lasix 40 mg daily - Pressures reasonable  HLD - lipitor 80 mg daily and Vascepa - LDL unable to calculate, TG 559, HDL 37  DM - A1C 10.1 - SSI - needs further management per primary as outpatient  Chronic diastolic HF - Echo with EF 50-55%, G1DD, mild MR - lasix 40 mg daily for elevated LVEDP   For questions or updates, please contact [facility] Please consult [email or web address] for contact info under        Signed, Hennessy Bartel Ninfa Meeker, PA-C  05/25/2020, 7:26 AM

## 2020-05-26 ENCOUNTER — Encounter: Payer: Commercial Managed Care - PPO | Admitting: Sports Medicine

## 2020-05-26 ENCOUNTER — Encounter (HOSPITAL_COMMUNITY): Payer: Self-pay | Admitting: Cardiovascular Disease

## 2020-05-26 ENCOUNTER — Other Ambulatory Visit: Payer: Self-pay

## 2020-05-26 LAB — CBC
HCT: 30.6 % — ABNORMAL LOW (ref 36.0–46.0)
Hemoglobin: 9.9 g/dL — ABNORMAL LOW (ref 12.0–15.0)
MCH: 27.9 pg (ref 26.0–34.0)
MCHC: 32.4 g/dL (ref 30.0–36.0)
MCV: 86.2 fL (ref 80.0–100.0)
Platelets: 255 10*3/uL (ref 150–400)
RBC: 3.55 MIL/uL — ABNORMAL LOW (ref 3.87–5.11)
RDW: 14.6 % (ref 11.5–15.5)
WBC: 9.9 10*3/uL (ref 4.0–10.5)
nRBC: 0 % (ref 0.0–0.2)

## 2020-05-26 LAB — BASIC METABOLIC PANEL
Anion gap: 10 (ref 5–15)
BUN: 9 mg/dL (ref 6–20)
CO2: 24 mmol/L (ref 22–32)
Calcium: 8.6 mg/dL — ABNORMAL LOW (ref 8.9–10.3)
Chloride: 105 mmol/L (ref 98–111)
Creatinine, Ser: 0.74 mg/dL (ref 0.44–1.00)
GFR calc Af Amer: >60 mL/min (ref >60)
GFR calc non Af Amer: >60 mL/min (ref >60)
Glucose, Bld: 261 mg/dL — ABNORMAL HIGH (ref 70–99)
Potassium: 3.6 mmol/L (ref 3.5–5.1)
Sodium: 139 mmol/L (ref 135–145)

## 2020-05-26 LAB — GLUCOSE, CAPILLARY
Glucose-Capillary: 246 mg/dL — ABNORMAL HIGH (ref 70–99)
Glucose-Capillary: 335 mg/dL — ABNORMAL HIGH (ref 70–99)

## 2020-05-26 MED ORDER — ICOSAPENT ETHYL 1 G PO CAPS
2.0000 g | ORAL_CAPSULE | Freq: Two times a day (BID) | ORAL | 4 refills | Status: DC
Start: 2020-05-26 — End: 2021-02-14

## 2020-05-26 MED ORDER — LISINOPRIL 20 MG PO TABS: 20.0000 mg | ORAL_TABLET | Freq: Every day | ORAL | 3 refills | Status: DC

## 2020-05-26 MED ORDER — ATORVASTATIN CALCIUM 40 MG PO TABS: 80.0000 mg | ORAL_TABLET | Freq: Every day | ORAL | 3 refills | Status: DC

## 2020-05-26 MED ORDER — PANTOPRAZOLE SODIUM 40 MG PO TBEC: 40.0000 mg | DELAYED_RELEASE_TABLET | Freq: Every day | ORAL | 3 refills | Status: DC

## 2020-05-26 NOTE — Progress Notes (Signed)
AM EKG read critical result, ACUTE MI/STEMI.  Patient has had no chest pain thus far this shift.  Cardiology paged, no STEMI noted, no new orders received.

## 2020-05-26 NOTE — Progress Notes (Signed)
Patient did not want bed alarm turned on.  Per patient has been going to the bathroom by herself previously and did not see the necessity of the bed alarm.  RN explained upon this shift assessment due to patient's recent procedure and tethering equipment RN would like for a staff member to be present to assist patient.  Patient stated she understood RN but did not want bed alarm turned on.  Bed alarm kept off per patient request, call light within reach.

## 2020-05-26 NOTE — Discharge Summary (Signed)
Discharge Summary    [name] MRN: [id number]; DOB: 17-Dec-1961  Admit date: 05/22/2020 Discharge date: 05/26/2020  Primary Care Provider: Ernestene Kiel, MD  Primary Cardiologist: Dr. [name] Primary Electrophysiologist:  None   Discharge Diagnoses    Principal Problem:   NSTEMI (non-ST elevated myocardial infarction) [facility]) Active Problems:   Chronic diastolic heart failure ([place])   Essential hypertension   Hyperlipidemia   Morbid obesity ([place])   S/P CABG (coronary artery bypass graft)   Uncontrolled type 2 diabetes mellitus ([place])   Unstable angina [facility])   Diagnostic Studies/Procedures    Cardiac catheterization 05/23/2020 Conclusions: 1. Severe native coronary artery disease, including 80-90% mid LMCA stenosis, occluded mid LAD, diffusely disease LCx with occlusion of OM1, and 80% in-stent restenosis of the mid/distal RCA followed by 70% distal RCA and rPL lesions, as well as occlusion of the ostial rPDA. 2. Patent LIMA-LAD. 3. Occluded SVG-D2, SVG-OM1, and SVG-rPDA. 4. Upper normal left ventricular filling pressure. 5. Small left radial artery with significant vasospasm.  Recommend alternate access for further catheterizations.  Recommendations: 1. Staged PCI to mid/distal RCA and mid LMCA (likely Wednesday if renal function remains stable).  Recommend femoral approach, given significant left radial vasospasm with 46F diagnostic catheters. 2. Continue dual antiplatelet therapy with aspirin and clopidogrel for at least 12 months, ideally longer. 3. Aggressive secondary prevention, including escalation of statin therapy. Coronary Diagrams  Diagnostic Dominance: Right      Cardiac catheterization 05/25/2020  Prox RCA lesion is 30% stenosed.  Mid RCA to Dist RCA lesion is 90% stenosed.  Dist RCA lesion is 70% stenosed.  RPDA lesion is 100% stenosed.  RPAV lesion is 70% stenosed.  A drug-eluting stent was successfully placed using a [place] [id number].  Post intervention, there is a 0% residual stenosis.  Post intervention, there is a 0% residual stenosis.   Successful angioplasty and drug-eluting stent placement to the mid/distal right coronary artery for severe in-stent restenosis as well as significant disease distal to the stented segment. Transient acute closure of the vessel likely due to thrombus formation after predilation.  Significant difficulty advancing equipment beyond the lesion due to tortuosity, previously stented segment and calcifications.  Successful stent placement was performed with the use of telescope catheter.  Recommendations: Dual antiplatelet therapy for at least 1 year and preferably longer. Aggressive treatment of risk factors. Left main PCI was planned but given difficulty with RCA PCI, recommend staging left main PCI to the outpatient setting.  Coronary Diagrams  Diagnostic Dominance: Right  Intervention     Echo 05/23/2020 1. Left ventricular ejection fraction, by estimation, is 50 to 55%. The  left ventricle has low normal function. The left ventricle demonstrates  regional wall motion abnormalities (see scoring diagram/findings for  description). There is mild left  ventricular hypertrophy. Left ventricular diastolic parameters are  consistent with Grade I diastolic dysfunction (impaired relaxation).  Elevated left ventricular end-diastolic pressure. There is moderate  dyskinesis of the left ventricular, mid-apical  inferoseptal wall.  2. Right ventricular systolic function is mildly reduced. The right  ventricular size is normal.  3. The mitral valve is abnormal. Mild mitral valve regurgitation.  4. The aortic valve is tricuspid. Aortic valve regurgitation is not  visualized. Mild aortic valve sclerosis is present, with no evidence of  aortic valve stenosis.  _____________   History of Present Illness     Jasmine Richardson is a 59 y.o. female with history of CAD  status post CABG x4 in  2017 (LIMA to LAD, SVG to OM1, diagonal, and PDA) c/b area mediastinitis requiring reoperation, PCI to RCA in 2009, diabetes, hypertension, diastolic heart failure, hyperlipidemia who was seen in the ER at [facility] 05/22/2020 for chest pain.  Pain started earlier in the day with an acute onset.  It was a midsternal crushing chest pain with no other associated symptoms. In the ED Initial EKG did not show new ischemia.  Troponin came back to 0.2.  Repeat right-sided EKG showed possible STEMI and the patient was transferred to [facility] health.  In route the patient was given IV heparin and 2 doses of morphine.  Of note the patient was seen in the ED at [facility] 01/22/2020 for chest pain.  Stress test at that time showed small area of ischemia of the anterolateral wall with a EF of 55%.  Hospital Course     Consultants: None  On arrival to [facility] the patient was symptom-free.  EKGs were reviewed and felt not to be consistent with STEMI.  Given this and resolution of symptoms catheterization was deferred and patient was admitted to cardiology.  Labs showed creatinine 0.75, troponin 619, A1c 10.1.  EKG showed sinus with a right bundle and no evidence of ischemia.  Home Plavix, metoprolol, and pravastatin were continued on admission. Ramipril was replaced with lisinopril. Previous notes suggest patient was unable to tolerate atorvastatin or rosuvastatin in the past.  Labs showed LDL unable to calculate, HDL 37, TG 559.  Vascepa was added to her regimen and atorvastatin was retrialed.  Next day patient was taken to the Cath Lab. Catheterization showed severe native coronary artery disease including 80 to 90% mid LMCA stenosis, occluded mid LAD, diffusely diseased left circumflex with occlusion of OM1, and 80% in-stent restenosis of the mid to distal RCA followed by 70% distal RCA and rOL lesions as well as occlusion of the ostial rPDA; LIMA to LAD patent; occluded SVG to D2, SVG to OM1 and SVG to  rPDA; upper normal LV filling pressures; small left radial artery with significant vasospasm.  Due to the vasospasm, femoral approach was recommended for planned staged PCI pending stable renal function.  The patient remained stable as well as her creatinine and she was taken back for staged PCI via femoral approach. She underwent successful angioplasty and DES placement to the mid to distal RCA for severe in-stent restenosis.  Left main PCI was originally planned but given the difficulty of the RCA this was recommended staging left main PCI in the outpatient setting.  Plan to continue with DAPT for at least 1 year and preferably longer.  Echo showed EF 50 to 55%, regional wall motion abnormalities, mild LVH, grade 1 diastolic dysfunction and elevated LVEDP, reduced RV function, mild MR.  Follow-up labs showed creatinine of 0.74 and hemoglobin of 9.9. Right radial and right groin cath sites remained stable. Patient worked with cardiac rehab and was able to ambulate without chest pain or sob. Plan to discharge patient with DAPT aspirin and Plavix for at least a year possibly longer.  We will give a new prescription for Vascepa and atorvastatin 80 mg, protonix 40 mg, and lisinopril 20 mg. Will defer staging of left main PCI to Dr. [name] at follow-up.   Patient was valuated by Dr. Audie Box 05/26/2020 and felt to be stable for discharge.  GEN:No acute distress.   Neck:No JVD Cardiac:RRR, no murmurs, rubs, or gallops.  Respiratory:Clear to auscultation bilaterally. PP:IRJJ, nontender, non-distended  MS:No edema; No deformity; right radial and  groin sites without tenderness, bruising, bleeding Neuro:Nonfocal  Psych: Normal affect    Did the patient have an acute coronary syndrome (MI, NSTEMI, STEMI, etc) this admission?:  Yes                               AHA/ACC Clinical Performance & Quality Measures: 1. Aspirin prescribed? - Yes 2. ADP Receptor Inhibitor (Plavix/Clopidogrel, Brilinta/Ticagrelor  or Effient/Prasugrel) prescribed (includes medically managed patients)? - Yes 3. Beta Blocker prescribed? - Yes 4. High Intensity Statin (Lipitor 40-80mg  or Crestor 20-40mg ) prescribed? - Yes 5. EF assessed during THIS hospitalization? - Yes 6. For EF <40%, was ACEI/ARB prescribed? - Not Applicable (EF >/= 22%) 7. For EF <40%, Aldosterone Antagonist (Spironolactone or Eplerenone) prescribed? - Not Applicable (EF >/= 02%) 8. Cardiac Rehab Phase II ordered (including medically managed patients)? - Yes   _____________  Discharge Vitals Blood pressure (!) 135/94, pulse 85, temperature (!) 97.5 F (36.4 C), temperature source Oral, resp. rate 18, height 5\' 7"  (1.702 m), weight 101.6 kg, SpO2 97 %.  Filed Weights   05/24/20 0346 05/25/20 0408 05/26/20 0455  Weight: 102.4 kg 101.8 kg 101.6 kg    Labs & Radiologic Studies    CBC Recent Labs    05/25/20 0516 05/26/20 0407  WBC 6.3 9.9  HGB 9.6* 9.9*  HCT 29.3* 30.6*  MCV 85.2 86.2  PLT 245 542   Basic Metabolic Panel Recent Labs    05/25/20 0516 05/26/20 0407  NA 137 139  K 3.4* 3.6  CL 104 105  CO2 24 24  GLUCOSE 310* 261*  BUN 11 9  CREATININE 0.71 0.74  CALCIUM 8.6* 8.6*   Liver Function Tests No results for input(s): AST, ALT, ALKPHOS, BILITOT, PROT, ALBUMIN in the last 72 hours. No results for input(s): LIPASE, AMYLASE in the last 72 hours. High Sensitivity Troponin:   Recent Labs  Lab 05/22/20 2122 05/23/20 0542  TROPONINIHS 619* 1,404*    BNP Invalid input(s): POCBNP D-Dimer No results for input(s): DDIMER in the last 72 hours. Hemoglobin A1C No results for input(s): HGBA1C in the last 72 hours. Fasting Lipid Panel No results for input(s): CHOL, HDL, LDLCALC, TRIG, CHOLHDL, LDLDIRECT in the last 72 hours. Thyroid Function Tests No results for input(s): TSH, T4TOTAL, T3FREE, THYROIDAB in the last 72 hours.  Invalid input(s): FREET3 _____________  CARDIAC CATHETERIZATION  Result Date: 05/25/2020  Prox  RCA lesion is 30% stenosed.  Mid RCA to Dist RCA lesion is 90% stenosed.  Dist RCA lesion is 70% stenosed.  RPDA lesion is 100% stenosed.  RPAV lesion is 70% stenosed.  A drug-eluting stent was successfully placed using a [place] [id number].  Post intervention, there is a 0% residual stenosis.  Post intervention, there is a 0% residual stenosis.  Successful angioplasty and drug-eluting stent placement to the mid/distal right coronary artery for severe in-stent restenosis as well as significant disease distal to the stented segment. Transient acute closure of the vessel likely due to thrombus formation after predilation.  Significant difficulty advancing equipment beyond the lesion due to tortuosity, previously stented segment and calcifications.  Successful stent placement was performed with the use of telescope catheter. Recommendations: Dual antiplatelet therapy for at least 1 year and preferably longer. Aggressive treatment of risk factors. Left main PCI was planned but given difficulty with RCA PCI, recommend staging left main PCI to the outpatient setting.   CARDIAC CATHETERIZATION  Result Date: 05/23/2020 Conclusions:  1. Severe native coronary artery disease, including 80-90% mid LMCA stenosis, occluded mid LAD, diffusely disease LCx with occlusion of OM1, and 80% in-stent restenosis of the mid/distal RCA followed by 70% distal RCA and rPL lesions, as well as occlusion of the ostial rPDA. 2. Patent LIMA-LAD. 3. Occluded SVG-D2, SVG-OM1, and SVG-rPDA. 4. Upper normal left ventricular filling pressure. 5. Small left radial artery with significant vasospasm.  Recommend alternate access for further catheterizations. Recommendations: 1. Staged PCI to mid/distal RCA and mid LMCA (likely Wednesday if renal function remains stable).  Recommend femoral approach, given significant left radial vasospasm with 65F diagnostic catheters. 2. Continue dual antiplatelet therapy with aspirin and clopidogrel for  at least 12 months, ideally longer. 3. Aggressive secondary prevention, including escalation of statin therapy. Nelva Bush, MD [facility]   DG Foot Complete Right  Result Date: 05/25/2020 Please see detailed radiograph report in office note.  ECHOCARDIOGRAM COMPLETE  Result Date: 05/23/2020    ECHOCARDIOGRAM REPORT   Patient Name:   Jasmine Richardson Date of Exam: 05/23/2020 Medical Rec #:  [id number]     Height:       67.0 in Accession #:    [id number]    Weight:       227.9 lb Date of Birth:  1961/12/17    BSA:          2.138 m Patient Age:    34 years      BP:           141/81 mmHg Patient Gender: F             HR:           77 bpm. Exam Location:  Inpatient Procedure: 2D Echo and Intracardiac Opacification Agent Indications:    Acute myocardial infarction 410  History:        Patient has no prior history of Echocardiogram examinations.                 CAD, Prior CABG, Arrythmias:Sinus tachycardia,                 Signs/Symptoms:Chest Pain; Risk Factors:Hypertension,                 Dyslipidemia and Diabetes. Emphysema lung.  Sonographer:    Darlina Sicilian RDCS Referring Phys: [phone] ADAM S BARNETT IMPRESSIONS  1. Left ventricular ejection fraction, by estimation, is 50 to 55%. The left ventricle has low normal function. The left ventricle demonstrates regional wall motion abnormalities (see scoring diagram/findings for description). There is mild left ventricular hypertrophy. Left ventricular diastolic parameters are consistent with Grade I diastolic dysfunction (impaired relaxation). Elevated left ventricular end-diastolic pressure. There is moderate dyskinesis of the left ventricular, mid-apical inferoseptal wall.  2. Right ventricular systolic function is mildly reduced. The right ventricular size is normal.  3. The mitral valve is abnormal. Mild mitral valve regurgitation.  4. The aortic valve is tricuspid. Aortic valve regurgitation is not visualized. Mild aortic valve sclerosis is present, with no  evidence of aortic valve stenosis. FINDINGS  Left Ventricle: Left ventricular ejection fraction, by estimation, is 50 to 55%. The left ventricle has low normal function. The left ventricle demonstrates regional wall motion abnormalities. Moderate dyskinesis of the left ventricular, mid-apical inferoseptal wall. Definity contrast agent was given IV to delineate the left ventricular endocardial borders. The left ventricular internal cavity size was normal in size. There is mild left ventricular hypertrophy. Left ventricular diastolic parameters  are consistent with Grade I diastolic dysfunction (  impaired relaxation). Elevated left ventricular end-diastolic pressure. Right Ventricle: The right ventricular size is normal. No increase in right ventricular wall thickness. Right ventricular systolic function is mildly reduced. Left Atrium: Left atrial size was normal in size. Right Atrium: Right atrial size was normal in size. Pericardium: There is no evidence of pericardial effusion. Mitral Valve: The mitral valve is abnormal. There is mild thickening of the mitral valve leaflet(s). Mild mitral annular calcification. Mild mitral valve regurgitation. Tricuspid Valve: The tricuspid valve is grossly normal. Tricuspid valve regurgitation is not demonstrated. Aortic Valve: The aortic valve is tricuspid. Aortic valve regurgitation is not visualized. Mild aortic valve sclerosis is present, with no evidence of aortic valve stenosis. Pulmonic Valve: The pulmonic valve was grossly normal. Pulmonic valve regurgitation is trivial. Aorta: The aortic root and ascending aorta are structurally normal, with no evidence of dilitation. Venous: The inferior vena cava was not well visualized. IAS/Shunts: No atrial level shunt detected by color flow Doppler.  LEFT VENTRICLE PLAX 2D LVIDd:         5.00 cm      Diastology LVIDs:         4.20 cm      LV e' lateral:   7.62 cm/s LV PW:         0.90 cm      LV E/e' lateral: 10.8 LV IVS:        1.10  cm      LV e' medial:    3.92 cm/s LVOT diam:     2.50 cm      LV E/e' medial:  20.9 LV SV:         81 LV SV Index:   38 LVOT Area:     4.91 cm  LV Volumes (MOD) LV vol d, MOD A2C: 126.0 ml LV vol d, MOD A4C: 119.0 ml LV vol s, MOD A2C: 61.8 ml LV vol s, MOD A4C: 53.7 ml LV SV MOD A2C:     64.2 ml LV SV MOD A4C:     119.0 ml LV SV MOD BP:      66.5 ml RIGHT VENTRICLE RV S prime:     7.07 cm/s TAPSE (M-mode): 1.4 cm LEFT ATRIUM             Index       RIGHT ATRIUM           Index LA diam:        4.60 cm 2.15 cm/m  RA Area:     17.30 cm LA Vol (A2C):   60.3 ml 28.21 ml/m RA Volume:   45.50 ml  21.28 ml/m LA Vol (A4C):   36.3 ml 16.98 ml/m LA Biplane Vol: 49.6 ml 23.20 ml/m  AORTIC VALVE LVOT Vmax:   107.00 cm/s LVOT Vmean:  63.000 cm/s LVOT VTI:    0.164 m  AORTA Ao Root diam: 3.10 cm MITRAL VALVE MV Area (PHT): 4.80 cm     SHUNTS MV Decel Time: 158 msec     Systemic VTI:  0.16 m MV E velocity: 82.00 cm/s   Systemic Diam: 2.50 cm MV A velocity: 108.00 cm/s MV E/A ratio:  0.76 Lyman Bishop MD Electronically signed by Lyman Bishop MD Signature Date/Time: 05/23/2020/2:25:21 PM    Final    Disposition   Pt is being discharged home today in good condition.  Follow-up Plans & Appointments     Follow-up Information    Richardo Priest, MD Follow up on 06/07/2020.   Specialty:  Cardiology Why: @9 :40AM Contact information: [place] [address] [phone]              Discharge Instructions    Amb Referral to Cardiac Rehabilitation   Complete by: As directed    To [place]   Diagnosis:  Coronary Stents NSTEMI PTCA     After initial evaluation and assessments completed: Virtual Based Care may be provided alone or in conjunction with Phase 2 Cardiac Rehab based on patient barriers.: Yes      Discharge Medications   Allergies as of 05/26/2020      Reactions   Tramadol    Hallucinations   Codeine Rash      Medication List    STOP taking these medications     omeprazole 20 MG capsule Commonly known as: PRILOSEC Replaced by: pantoprazole 40 MG tablet   pravastatin 20 MG tablet Commonly known as: PRAVACHOL   ramipril 1.25 MG capsule Commonly known as: ALTACE     TAKE these medications   acetaminophen 500 MG tablet Commonly known as: TYLENOL Take 2 tablets (1,000 mg total) by mouth every 6 (six) hours as needed. What changed: reasons to take this   aspirin EC 81 MG tablet Take 1 tablet (81 mg total) by mouth daily.   atorvastatin 40 MG tablet Commonly known as: LIPITOR Take 2 tablets (80 mg total) by mouth daily.   Basaglar KwikPen 100 UNIT/ML Inject 0.2 mLs (20 Units total) into the skin at bedtime.   clopidogrel 75 MG tablet Commonly known as: PLAVIX Take 1 tablet (75 mg total) by mouth daily.   docusate sodium 100 MG capsule Commonly known as: Colace Take 1 capsule (100 mg total) by mouth daily as needed. What changed: reasons to take this   furosemide 40 MG tablet Commonly known as: LASIX Take 1 tablet by mouth once daily   glipiZIDE 10 MG tablet Commonly known as: GLUCOTROL Take 10 mg by mouth 2 (two) times daily.   icosapent Ethyl 1 g capsule Commonly known as: Vascepa Take 2 capsules (2 g total) by mouth 2 (two) times daily.   Januvia 100 MG tablet Generic drug: sitaGLIPtin Take 100 mg by mouth daily.   lisinopril 20 MG tablet Commonly known as: ZESTRIL Take 1 tablet (20 mg total) by mouth daily.   LORazepam 1 MG tablet Commonly known as: ATIVAN Take 1 mg by mouth as needed for anxiety.   metFORMIN 500 MG 24 hr tablet Commonly known as: GLUCOPHAGE-XR Take 1,000 mg by mouth 2 (two) times daily.   metoprolol succinate 50 MG 24 hr tablet Commonly known as: TOPROL-XL Take 150 mg by mouth daily.   nitroGLYCERIN 0.4 MG SL tablet Commonly known as: NITROSTAT Place 1 tablet (0.4 mg total) under the tongue every 5 (five) minutes x 3 doses as needed for chest pain.   pantoprazole 40 MG tablet Commonly  known as: PROTONIX Take 1 tablet (40 mg total) by mouth daily. Replaces: omeprazole 20 MG capsule   promethazine 12.5 MG tablet Commonly known as: PHENERGAN Take 1 tablet (12.5 mg total) by mouth every 6 (six) hours as needed for nausea or vomiting.   sulfamethoxazole-trimethoprim 400-80 MG tablet Commonly known as: BACTRIM Take 1 tablet by mouth 2 (two) times daily.   VITAMIN B 12 PO Take 1 tablet by mouth daily.   vitamin C 250 MG tablet Commonly known as: ASCORBIC ACID Take 250 mg by mouth daily.   Vitamin D (Ergocalciferol) 1.25 MG (50000 UNIT) Caps capsule Commonly  known as: DRISDOL Take 50,000 Units by mouth every 7 (seven) days. Saturdays          Outstanding Labs/Studies   None  Duration of Discharge Encounter   Greater than 30 minutes including physician time.  Signed, Jaspal Pultz Ninfa Meeker, PA-C 05/26/2020, 9:53 AM

## 2020-05-26 NOTE — Discharge Instructions (Signed)
Local Endocrinologists [facility] [phone]) 1. Dr. Philemon Kingdom 2. Dr. Janie Morning [facility] [phone]) 1. Dr. Delrae Rend [facility] [phone]) 1. Dr. Jacelyn Pi 2. Dr. Anda Kraft [facility] [phone] 1. Dr. Daneil Dolin [facility] [phone]) [[place] office]  [phone]) [[place] office] 1. Dr. Lenna Sciara Sol[place] 2. Dr. Mee Hives [facility]) [phone]) 1. Autumn Hudnall Ronnald Ramp), PA 2. Dr. Amalia Greenhouse 3. Dr. Marsh Dolly. [facility] [phone]) 1. Dr. Glade Lloyd [facility]  [phone]) 1. Dr. Orville Govern 2. Dr. Lelon Huh 3. Dr. Jerelene Redden 4. Alwyn Ren, FNP Dr. Carolynn Serve. [facility] in [place] [phone] Hemoglobin A1c Test Why am I having this test? You may have the hemoglobin A1c test (HbA1c test) done to:  Evaluate your risk for developing diabetes (diabetes mellitus).  Diagnose diabetes.  Monitor long-term control of blood sugar (glucose) in people who have diabetes and help make treatment decisions. This test may be done with other blood glucose tests, such as fasting blood glucose and oral glucose tolerance tests. What is being tested? Hemoglobin is a type of protein in the blood that carries oxygen. Glucose attaches to hemoglobin to form glycated hemoglobin. This test checks the amount of glycated hemoglobin in your blood, which is a good indicator of the average amount of glucose in your blood during the past 2-3 months. What kind of sample is taken?  A blood sample is required for this test. It is usually collected by inserting a needle into a blood vessel. Tell a health care provider about:  All medicines you are taking, including vitamins, herbs, eye drops, creams, and over-the-counter medicines.  Any blood disorders you  have.  Any surgeries you have had.  Any medical conditions you have.  Whether you are pregnant or may be pregnant. How are the results reported? Your results will be reported as a percentage that indicates how much of your hemoglobin has glucose attached to it (is glycated). Your health care provider will compare your results to normal ranges that were established after testing a large group of people (reference ranges). Reference ranges may vary among labs and hospitals. For this test, common reference ranges are:  Adult or child without diabetes: 4-5.6%.  Adult or child with diabetes and good blood glucose control: less than 7%. What do the results mean? If you have diabetes:  A result of less than 7% is considered normal, meaning that your blood glucose is well controlled.  A result higher than 7% means that your blood glucose is not well controlled, and your treatment plan may need to be adjusted. If you do not have diabetes:  A result within the reference range is considered normal, meaning that you are not at high risk for diabetes.  A result of 5.7-6.4% means that you have a high risk of developing diabetes, and you may have prediabetes. Prediabetes is the condition of having a blood glucose level that is higher than it should be, but not high enough for you to be diagnosed with diabetes. Having prediabetes puts you at risk for developing type 2 diabetes (type 2 diabetes mellitus). You may have more tests, including a repeat HbA1c test.  Results of 6.5% or higher on two separate HbA1c tests mean that you have diabetes. You may have more tests to confirm the diagnosis. Abnormally low HbA1c values may be caused by:  Pregnancy.  Severe blood loss.  Receiving donated blood (transfusions).  Low red blood cell count (anemia).  Long-term kidney failure.  Some unusual forms (variants) of hemoglobin. Talk with your health care provider about what your results mean. Questions to  ask your health care provider Ask your health care provider, or the department that is doing the test:  When will my results be ready?  How will I get my results?  What are my treatment options?  What other tests do I need?  What are my next steps? Summary  The hemoglobin A1c test (HbA1c test) may be done to evaluate your risk for developing diabetes, to diagnose diabetes, and to monitor long-term control of blood sugar (glucose) in people who have diabetes and help make treatment decisions.  Hemoglobin is a type of protein in the blood that carries oxygen. Glucose attaches to hemoglobin to form glycated hemoglobin. This test checks the amount of glycated hemoglobin in your blood, which is a good indicator of the average amount of glucose in your blood during the past 2-3 months.  Talk with your health care provider about what your results mean. This information is not intended to replace advice given to you by your health care provider. Make sure you discuss any questions you have with your health care provider. Document Revised: 11/15/2017 Document Reviewed: 07/16/2017 Elsevier Patient Education  [place].  Hyperglycemia Hyperglycemia occurs when the level of sugar (glucose) in the blood is too high. Glucose is a type of sugar that provides the body's main source of energy. Certain hormones (insulin and glucagon) control the level of glucose in the blood. Insulin lowers blood glucose, and glucagon increases blood glucose. Hyperglycemia can result from having too little insulin in the bloodstream, or from the body not responding normally to insulin. Hyperglycemia occurs most often in people who have diabetes (diabetes mellitus), but it can happen in people who do not have diabetes. It can develop quickly, and it can be life-threatening if it causes you to become severely dehydrated (diabetic ketoacidosis or hyperglycemic hyperosmolar state). Severe hyperglycemia is a medical  emergency. What are the causes? If you have diabetes, hyperglycemia may be caused by:  Diabetes medicine.  Medicines that increase blood glucose or affect your diabetes control.  Not eating enough, or not eating often enough.  Changes in physical activity level.  Being sick or having an infection. If you have prediabetes or undiagnosed diabetes:  Hyperglycemia may be caused by those conditions. If you do not have diabetes, hyperglycemia may be caused by:  Certain medicines, including steroid medicines, beta-blockers, epinephrine, and thiazide diuretics.  Stress.  Serious illness.  Surgery.  Diseases of the pancreas.  Infection. What increases the risk? Hyperglycemia is more likely to develop in people who have risk factors for diabetes, such as:  Having a family member with diabetes.  Having a gene for type 1 diabetes that is passed from parent to child (inherited).  Living in an area with cold weather conditions.  Exposure to certain viruses.  Certain conditions in which the body's disease-fighting (immune) system attacks itself (autoimmune disorders).  Being overweight or obese.  Having an inactive (sedentary) lifestyle.  Having been diagnosed with insulin resistance.  Having a history of prediabetes, gestational diabetes, or polycystic ovarian syndrome (PCOS).  Being of American-Indian, African-American, Hispanic/Latino, or Asian/Pacific Islander descent. What are the signs or symptoms? Hyperglycemia may not cause any symptoms. If you do have symptoms, they may include early warning signs, such as:  Increased thirst.  Hunger.  Feeling very tired.  Needing to  urinate more often than usual.  Blurry vision. Other symptoms may develop if hyperglycemia gets worse, such as:  Dry mouth.  Loss of appetite.  Fruity-smelling breath.  Weakness.  Unexpected or rapid weight gain or weight loss.  Tingling or numbness in the hands or  feet.  Headache.  Skin that does not quickly return to normal after being lightly pinched and released (poor skin turgor).  Abdominal pain.  Cuts or bruises that are slow to heal. How is this diagnosed? Hyperglycemia is diagnosed with a blood test to measure your blood glucose level. This blood test is usually done while you are having symptoms. Your health care provider may also do a physical exam and review your medical history. You may have more tests to determine the cause of your hyperglycemia, such as:  A fasting blood glucose (FBG) test. You will not be allowed to eat (you will fast) for at least 8 hours before a blood sample is taken.  An A1c (hemoglobin A1c) blood test. This provides information about blood glucose control over the previous 2-3 months.  An oral glucose tolerance test (OGTT). This measures your blood glucose at two times: ? After fasting. This is your baseline blood glucose level. ? Two hours after drinking a beverage that contains glucose. How is this treated? Treatment depends on the cause of your hyperglycemia. Treatment may include:  Taking medicine to regulate your blood glucose levels. If you take insulin or other diabetes medicines, your medicine or dosage may be adjusted.  Lifestyle changes, such as exercising more, eating healthier foods, or losing weight.  Treating an illness or infection, if this caused your hyperglycemia.  Checking your blood glucose more often.  Stopping or reducing steroid medicines, if these caused your hyperglycemia. If your hyperglycemia becomes severe and it results in hyperglycemic hyperosmolar state, you must be hospitalized and given IV fluids. Follow these instructions at home:  General instructions  Take over-the-counter and prescription medicines only as told by your health care provider.  Do not use any products that contain nicotine or tobacco, such as cigarettes and e-cigarettes. If you need help quitting, ask  your health care provider.  Limit alcohol intake to no more than 1 drink per day for nonpregnant women and 2 drinks per day for men. One drink equals 12 oz of beer, 5 oz of wine, or 1 oz of hard liquor.  Learn to manage stress. If you need help with this, ask your health care provider.  Keep all follow-up visits as told by your health care provider. This is important. Eating and drinking   Maintain a healthy weight.  Exercise regularly, as directed by your health care provider.  Stay hydrated, especially when you exercise, get sick, or spend time in hot temperatures.  Eat healthy foods, such as: ? Lean proteins. ? Complex carbohydrates. ? Fresh fruits and vegetables. ? Low-fat dairy products. ? Healthy fats.  Drink enough fluid to keep your urine clear or pale yellow. If you have diabetes:  Make sure you know the symptoms of hyperglycemia.  Follow your diabetes management plan, as told by your health care provider. Make sure you: ? Take your insulin and medicines as directed. ? Follow your exercise plan. ? Follow your meal plan. Eat on time, and do not skip meals. ? Check your blood glucose as often as directed. Make sure to check your blood glucose before and after exercise. If you exercise longer or in a different way than usual, check your blood glucose  more often. ? Follow your sick day plan whenever you cannot eat or drink normally. Make this plan in advance with your health care provider.  Share your diabetes management plan with people in your workplace, school, and household.  Check your urine for ketones when you are ill and as told by your health care provider.  Carry a medical alert card or wear medical alert jewelry. Contact a health care provider if:  Your blood glucose is at or above 240 mg/dL (13.3 mmol/L) for 2 days in a row.  You have problems keeping your blood glucose in your target range.  You have frequent episodes of hyperglycemia. Get help right  away if:  You have difficulty breathing.  You have a change in how you think, feel, or act (mental status).  You have nausea or vomiting that does not go away. These symptoms may represent a serious problem that is an emergency. Do not wait to see if the symptoms will go away. Get medical help right away. Call your local emergency services (911 in the [place] Do not drive yourself to the hospital. Summary Femoral Site Care This sheet gives you information about how to care for yourself after your procedure. Your health care provider may also give you more specific instructions. If you have problems or questions, contact your health care provider. What can I expect after the procedure? After the procedure, it is common to have:  Bruising that usually fades within 1-2 weeks.  Tenderness at the site. Follow these instructions at home: Wound care  Follow instructions from your health care provider about how to take care of your insertion site. Make sure you: ? Wash your hands with soap and water before you change your bandage (dressing). If soap and water are not available, use hand sanitizer. ? Change your dressing as told by your health care provider. ? Leave stitches (sutures), skin glue, or adhesive strips in place. These skin closures may need to stay in place for 2 weeks or longer. If adhesive strip edges start to loosen and curl up, you may trim the loose edges. Do not remove adhesive strips completely unless your health care provider tells you to do that.  Do not take baths, swim, or use a hot tub until your health care provider approves.  You may shower 24-48 hours after the procedure or as told by your health care provider. ? Gently wash the site with plain soap and water. ? Pat the area dry with a clean towel. ? Do not rub the site. This may cause bleeding.  Do not apply powder or lotion to the site. Keep the site clean and dry.  Check your femoral site every day for signs of  infection. Check for: ? Redness, swelling, or pain. ? Fluid or blood. ? Warmth. ? Pus or a bad smell. Activity  For the first 2-3 days after your procedure, or as long as directed: ? Avoid climbing stairs as much as possible. ? Do not squat.  Do not lift anything that is heavier than 10 lb (4.5 kg), or the limit that you are told, until your health care provider says that it is safe.  Rest as directed. ? Avoid sitting for a long time without moving. Get up to take short walks every 1-2 hours.  Do not drive for 24 hours if you were given a medicine to help you relax (sedative). General instructions  Take over-the-counter and prescription medicines only as told by your health care provider.  Keep all follow-up visits as told by your health care provider. This is important. Contact a health care provider if you have:  A fever or chills.  You have redness, swelling, or pain around your insertion site. Get help right away if:  The catheter insertion area swells very fast.  You pass out.  You suddenly start to sweat or your skin gets clammy.  The catheter insertion area is bleeding, and the bleeding does not stop when you hold steady pressure on the area.  The area near or just beyond the catheter insertion site becomes pale, cool, tingly, or numb. These symptoms may represent a serious problem that is an emergency. Do not wait to see if the symptoms will go away. Get medical help right away. Call your local emergency services (911 in the [place] Do not drive yourself to the hospital. Summary  After the procedure, it is common to have bruising that usually fades within 1-2 weeks.  Check your femoral site every day for signs of infection.  Do not lift anything that is heavier than 10 lb (4.5 kg), or the limit that you are told, until your health care provider says that it is safe. This information is not intended to replace advice given to you by your health care provider. Make  sure you discuss any questions you have with your health care provider. Document Revised: 12/16/2017 Document Reviewed: 12/16/2017 Elsevier Patient Education  [place].   Hyperglycemia occurs when the level of sugar (glucose) in the blood is too high.  Hyperglycemia is diagnosed with a blood test to measure your blood glucose level. This blood test is usually done while you are having symptoms. Your health care provider may also do a physical exam and review your medical history.  If you have diabetes, follow your diabetes management plan as told by your health care provider.  Contact your health care provider if you have problems keeping your blood glucose in your target range. This information is not intended to replace advice given to you by your health care provider. Make sure you discuss any questions you have with your health care provider. Document Revised: 08/20/2016 Document Reviewed: 08/20/2016 Elsevier Patient Education  [place].

## 2020-05-26 NOTE — Progress Notes (Signed)
CARDIAC REHAB PHASE I   PRE:  Rate/Rhythm: 85 SR    BP: sitting 135/94    SaO2: 97 RA  MODE:  Ambulation: 430 ft   POST:  Rate/Rhythm: 116 ST with PVCs    BP: sitting 156/98     SaO2: 97 RA  Pt ambulated hall with RW and her darco boot. Slow and steady. No CP but HR and BP elevated. Has RW at home if needed.  Discussed stent, restrictions, Plavix, diet including better DM control, exercise, NTG and CRPII. Will refer to [place]. She did before and enjoyed it. Needs PCI before starting.  [place], ACSM 05/26/2020 9:17 AM

## 2020-06-01 ENCOUNTER — Encounter: Payer: Self-pay | Admitting: Sports Medicine

## 2020-06-01 ENCOUNTER — Ambulatory Visit (INDEPENDENT_AMBULATORY_CARE_PROVIDER_SITE_OTHER): Payer: Commercial Managed Care - PPO | Admitting: Sports Medicine

## 2020-06-01 ENCOUNTER — Other Ambulatory Visit: Payer: Self-pay

## 2020-06-01 DIAGNOSIS — Z9889 Other specified postprocedural states: Secondary | ICD-10-CM

## 2020-06-01 DIAGNOSIS — Z89419 Acquired absence of unspecified great toe: Secondary | ICD-10-CM

## 2020-06-01 DIAGNOSIS — E1165 Type 2 diabetes mellitus with hyperglycemia: Secondary | ICD-10-CM

## 2020-06-01 NOTE — Progress Notes (Signed)
Subjective: Jasmine Richardson is a 59 y.o. female patient seen today in office for POV # 4 (DOS 05/02/2020), S/P revision of amputation stump site right foot with removal of additional bone at the first metatarsal head and primary wound closure.  Patient was admitted to the hospital for heart attack currently seeing Dr. [name] cardiology reports that her son rewrap the dressing and reports that pain is okay sometimes depending if she is standing up for a really long time feels about 2 out of 10 pain takes Tylenol occasionally and has been using surgical shoe denies nausea vomiting fever chills or any other constitutional symptoms at this time.  No other issues noted.   Fasting blood sugar 242  Patient assisted by husband this visit.  Patient Active Problem List   Diagnosis Date Noted  . Unstable angina ([place]) 05/22/2020  . NSTEMI (non-ST elevated myocardial infarction) ([place]) 05/22/2020  . Diabetic foot ulcer ([place]) 02/16/2020  . Angina pectoris ([place]) 09/11/2017  . Emphysema lung ([place]) 09/11/2017  . Chronic diastolic heart failure ([place]) 11/06/2016  . Mediastinitis 06/28/2016  . Wound, surgical, infected 06/06/2016  . S/P CABG (coronary artery bypass graft) 06/03/2016  . GERD (gastroesophageal reflux disease) 05/08/2016  . Morbid obesity ([place]) 05/08/2016  . Uncontrolled type 2 diabetes mellitus ([place]) 05/08/2016  . Tobacco use disorder 04/20/2016  . Coronary artery disease involving native coronary artery of native heart with angina pectoris ([place]) 09/12/2015  . Essential hypertension 09/12/2015  . Hyperlipidemia 09/12/2015    Current Outpatient Medications on File Prior to Visit  Medication Sig Dispense Refill  . acetaminophen (TYLENOL) 500 MG tablet Take 2 tablets (1,000 mg total) by mouth every 6 (six) hours as needed. (Patient taking differently: Take 1,000 mg by mouth every 6 (six) hours as needed for mild pain. ) 100 tablet 2  . aspirin EC 81 MG tablet Take 1 tablet (81 mg total) by mouth  daily. 90 tablet 3  . atorvastatin (LIPITOR) 40 MG tablet Take 2 tablets (80 mg total) by mouth daily. 60 tablet 3  . clopidogrel (PLAVIX) 75 MG tablet Take 1 tablet (75 mg total) by mouth daily. 30 tablet 10  . Cyanocobalamin (VITAMIN B 12 PO) Take 1 tablet by mouth daily.    [name] docusate sodium (COLACE) 100 MG capsule Take 1 capsule (100 mg total) by mouth daily as needed. (Patient taking differently: Take 100 mg by mouth daily as needed for mild constipation. ) 30 capsule 2  . furosemide (LASIX) 40 MG tablet Take 1 tablet by mouth once daily (Patient taking differently: Take 40 mg by mouth daily. ) 90 tablet 2  . glipiZIDE (GLUCOTROL) 10 MG tablet Take 10 mg by mouth 2 (two) times daily.    [name] icosapent Ethyl (VASCEPA) 1 g capsule Take 2 capsules (2 g total) by mouth 2 (two) times daily. 120 capsule 4  . Insulin Glargine (BASAGLAR KWIKPEN) 100 UNIT/ML Inject 0.2 mLs (20 Units total) into the skin at bedtime. 15 mL 0  . JANUVIA 100 MG tablet Take 100 mg by mouth daily.    [name] lisinopril (ZESTRIL) 20 MG tablet Take 1 tablet (20 mg total) by mouth daily. 60 tablet 3  . LORazepam (ATIVAN) 1 MG tablet Take 1 mg by mouth as needed for anxiety.    . metFORMIN (GLUCOPHAGE-XR) 500 MG 24 hr tablet Take 1,000 mg by mouth 2 (two) times daily.    . metoprolol succinate (TOPROL-XL) 50 MG 24 hr tablet Take 150 mg by mouth daily.     [name]  nitroGLYCERIN (NITROSTAT) 0.4 MG SL tablet Place 1 tablet (0.4 mg total) under the tongue every 5 (five) minutes x 3 doses as needed for chest pain. 30 tablet 11  . pantoprazole (PROTONIX) 40 MG tablet Take 1 tablet (40 mg total) by mouth daily. 60 tablet 3  . promethazine (PHENERGAN) 12.5 MG tablet Take 1 tablet (12.5 mg total) by mouth every 6 (six) hours as needed for nausea or vomiting. (Patient not taking: Reported on 05/13/2020) 30 tablet 0  . sulfamethoxazole-trimethoprim (BACTRIM) 400-80 MG tablet Take 1 tablet by mouth 2 (two) times daily. (Patient not taking: Reported on  05/22/2020) 28 tablet 0  . vitamin C (ASCORBIC ACID) 250 MG tablet Take 250 mg by mouth daily.    . Vitamin D, Ergocalciferol, (DRISDOL) 50000 units CAPS capsule Take 50,000 Units by mouth every 7 (seven) days. Saturdays     No current facility-administered medications on file prior to visit.    Allergies  Allergen Reactions  . Tramadol     Hallucinations  . Codeine Rash    Objective: There were no vitals filed for this visit.  General: No acute distress, AAOx3  Right foot: Remaining staples intact with no gapping or dehiscence at surgical site, mild swelling to the amputation stump site, decreased blanchable erythema, no warmth, no other signs of infection noted, Capillary fill time <5 seconds in all lesser digits, protective sensation severely diminished.  No pain or crepitation with range of motion right foot.  No pain with calf compression.  Assessment and Plan:  Problem List Items Addressed This Visit      Endocrine   Uncontrolled type 2 diabetes mellitus ([place])    Other Visit Diagnoses    S/P foot surgery, right    -  Primary   History of amputation of hallux ([place])           -Patient seen and evaluated -Few staples were removed -Applied Steri-Strips and dry sterile dressing to surgical site right foot secured with ACE wrap and stockinet  -Advised patient to make sure to keep dressings clean, dry, and intact to right foot surgical site  -Advised patient to limit activity to necessity and to continue with postoperative wedge shoe and cane like before -Advised patient to elevate as necessary  -Advised patient to limit activity -Continue with Tylenol as needed for pain -Return to office next week for finishing staple removal or sooner if problems or issues arise. Landis Martins, DPM

## 2020-06-06 NOTE — H&P (View-Only) (Signed)
Cardiology Office Note:    Date:  06/07/2020   ID:  Jasmine Richardson, DOB [birth year], MRN [id number]  PCP:  Ernestene Kiel, MD  Cardiologist:  Shirlee More, MD    Referring MD: Ernestene Kiel, MD    ASSESSMENT:    1. Coronary artery disease involving native coronary artery of native heart with angina pectoris ([place])   2. Hypertensive heart disease with chronic diastolic congestive heart failure ([place])   3. Mixed hyperlipidemia    PLAN:    In order of problems listed above:  1. Very complex case previous bypass surgery vein grafts are occluded recent ACS and intent for second stage procedure left circumflex coronary artery.  I have contacted Dr. Ellyn Hack interventional radiologist asked to review films and give me an opinion.  Continue medical therapy plan to see her in 4 weeks but if he feels we need to do the procedure I will call and will schedule in the interim as an outpatient. 2. Improved heart failure is compensated blood pressure target continue current medical therapy including beta-blocker and ACE inhibitor 3. Poorly controlled with poorly controlled diabetes continue her statin icosapent ethyl recheck lipids consider PCSK9 4. Diabetes improving managed by her PCP   Next appointment: 1 month   Medication Adjustments/Labs and Tests Ordered: Current medicines are reviewed at length with the patient today.  Concerns regarding medicines are outlined above.  No orders of the defined types were placed in this encounter.  No orders of the defined types were placed in this encounter.   Chief Complaint  Patient presents with  . Follow-up  . Coronary Artery Disease    Recent admission ACS coronary angiography showed graft occlusion and she had complex PCI and stenting native right coronary artery and is awaiting staged stenting of her native left main coronary artery    History of Present Illness:    Jasmine Richardson is a 59 y.o. female with a hx of  CAD, S/P CABG  04/23/16 with post operative mediastinitis and reoperation at [facility] with a myocutaneous pectoral flaps for an infected sternotomy after CABG , diastolic heart failure, T2 DM, hypertension and hyperlipidemia  last seen 05/13/2020 after surgical intervention for diabetic foot infection.Jasmine Richardson developed unstable angina high-sensitivity troponin positive coronary angiography showed severe native three-vessel coronary artery disease vein graft to second diagonal first marginal and PDA were occluded and left thoracic artery was patent.  She underwent staged PCI 05/25/2020 which was complex however successful with PCI and stent of the mid and distal right coronary artery commendation for staged left main PCI as an outpatient.   2 wk ago  (05/23/20) 2 wk ago  (05/22/20)   Troponin I (High Sensitivity) <18 ng/L 1,404High Panic  619High Panic   Coronary Diagrams  Diagnostic Dominance: Right  Intervention       Compliance with diet, lifestyle and medications: Yes  She is improved has had no further anginal discomfort feels better.  I approached her about the second PCI staged procedure and she told me that her understanding was that it was optional.  I told her that I think she should be is fully revascularized is possible and I will seek a interventional radiology opinion.  She has had no further angina shortness of breath edema strength and endurance are better for life.  Despite positive troponin she is taking clopidogrel and aspirin and she takes a low intensity statin because of muscle symptoms.  At her next visit I will recheck a lipid profile she  may require PCSK9 therapy Past Medical History:  Diagnosis Date  . Acute chest pain 05/08/2016  . Angina pectoris ([place]) 09/11/2017  . Burping 05/08/2016  . Chronic diastolic heart failure ([place]) 11/06/2016  . Coronary artery disease involving native coronary artery of native heart with angina pectoris ([place]) 09/12/2015   Overview:  PCI and stent of  RCA 2009, last cath 2012 with medical therapy  CABG May 2017  . Emphysema lung ([place]) 09/11/2017  . Essential hypertension 09/12/2015  . GERD (gastroesophageal reflux disease) 05/08/2016  . Hyperlipidemia 09/12/2015  . Mediastinitis 06/28/2016  . Morbid obesity ([place]) 05/08/2016  . S/P CABG (coronary artery bypass graft) 06/03/2016   Overview:  The patient underwent sternal reconstruction on 06/21/16 with pec flaps for mediastinitis from a prior CABG in May 2017. On admission, she was critically ill from sepsis and had altered mental status. She was last seen in clinic on 08/09/16 at which time she was doing well.  . Severe sepsis ([place]) 06/03/2016  . Sinus tachycardia 05/08/2016  . Tobacco use disorder 04/20/2016   Overview:  Quit in May 2017.  [name] Uncontrolled type 2 diabetes mellitus ([place]) 05/08/2016  . Wound, surgical, infected 06/06/2016   Overview:  sternal    Past Surgical History:  Procedure Laterality Date  . AMPUTATION TOE Right 02/20/2020   Procedure: AMPUTATION RIGHT GREAT  TOE;  Surgeon: Felipa Furnace, DPM;  Location: [place];  Service: Podiatry;  Laterality: Right;  . BLADDER SURGERY    . CARDIAC CATHETERIZATION    . CORONARY ARTERY BYPASS GRAFT    . CORONARY STENT INTERVENTION N/A 05/25/2020   Procedure: CORONARY STENT INTERVENTION;  Surgeon: Wellington Hampshire, MD;  Location: [place] [facility];  Service: Cardiovascular;  Laterality: N/A;  . INCISION AND DRAINAGE Right 02/19/2020   Procedure: INCISION AND DRAINAGE;  Surgeon: Trula Slade, DPM;  Location: [place];  Service: Podiatry;  Laterality: Right;  Block done by surgeon  . LEFT HEART CATH AND CORS/GRAFTS ANGIOGRAPHY N/A 05/23/2020   Procedure: LEFT HEART CATH AND CORS/GRAFTS ANGIOGRAPHY;  Surgeon: Nelva Bush, MD;  Location: [place] [facility];  Service: Cardiovascular;  Laterality: N/A;  . METATARSAL HEAD EXCISION Right 05/02/2020   Procedure: FIRST METATARSAL HEAD RESECTION; RIGHT FOOT WOUND CLOSURE;  Surgeon: Landis Martins, DPM;   Location: [place];  Service: Podiatry;  Laterality: Right;  MAC W/LOCAL  . TUBAL LIGATION      Current Medications: Current Meds  Medication Sig  . acetaminophen (TYLENOL) 500 MG tablet Take 2 tablets (1,000 mg total) by mouth every 6 (six) hours as needed.  [name] aspirin EC 81 MG tablet Take 1 tablet (81 mg total) by mouth daily.  . clopidogrel (PLAVIX) 75 MG tablet Take 1 tablet (75 mg total) by mouth daily.  . Cyanocobalamin (VITAMIN B 12 PO) Take 1 tablet by mouth daily.  [name] docusate sodium (COLACE) 100 MG capsule Take 1 capsule (100 mg total) by mouth daily as needed.  . furosemide (LASIX) 40 MG tablet Take 1 tablet by mouth once daily  . glipiZIDE (GLUCOTROL) 10 MG tablet Take 10 mg by mouth 2 (two) times daily.  [name] icosapent Ethyl (VASCEPA) 1 g capsule Take 2 capsules (2 g total) by mouth 2 (two) times daily.  . Insulin Glargine (BASAGLAR KWIKPEN) 100 UNIT/ML Inject 0.2 mLs (20 Units total) into the skin at bedtime.  [name] JANUVIA 100 MG tablet Take 100 mg by mouth daily.  [name] lisinopril (ZESTRIL) 20 MG tablet Take 1  tablet (20 mg total) by mouth daily.  [name] LORazepam (ATIVAN) 1 MG tablet Take 1 mg by mouth as needed for anxiety.  . metFORMIN (GLUCOPHAGE-XR) 500 MG 24 hr tablet Take 1,000 mg by mouth 2 (two) times daily.  . metoprolol succinate (TOPROL-XL) 50 MG 24 hr tablet Take 150 mg by mouth daily.   . nitroGLYCERIN (NITROSTAT) 0.4 MG SL tablet Place 1 tablet (0.4 mg total) under the tongue every 5 (five) minutes x 3 doses as needed for chest pain.  . pantoprazole (PROTONIX) 40 MG tablet Take 1 tablet (40 mg total) by mouth daily.  . promethazine (PHENERGAN) 12.5 MG tablet Take 1 tablet (12.5 mg total) by mouth every 6 (six) hours as needed for nausea or vomiting.  . sulfamethoxazole-trimethoprim (BACTRIM) 400-80 MG tablet Take 1 tablet by mouth 2 (two) times daily.  . vitamin C (ASCORBIC ACID) 250 MG tablet Take 250 mg by mouth daily.  . Vitamin D, Ergocalciferol, (DRISDOL)  50000 units CAPS capsule Take 50,000 Units by mouth every 7 (seven) days. Saturdays     Allergies:   Tramadol and Codeine   Social History   Socioeconomic History  . Marital status: Married    Spouse name: Not on file  . Number of children: Not on file  . Years of education: Not on file  . Highest education level: Not on file  Occupational History  . Not on file  Tobacco Use  . Smoking status: Former Smoker    Quit date: 05/2016    Years since quitting: 4.0  . Smokeless tobacco: Never Used  Vaping Use  . Vaping Use: Never used  Substance and Sexual Activity  . Alcohol use: No  . Drug use: No  . Sexual activity: Yes  Other Topics Concern  . Not on file  Social History Narrative  . Not on file   Social Determinants of Health   Financial Resource Strain:   . Difficulty of Paying Living Expenses:   Food Insecurity:   . Worried About Charity fundraiser in the Last Year:   . Arboriculturist in the Last Year:   Transportation Needs:   . Film/video editor (Medical):   [name] Lack of Transportation (Non-Medical):   Physical Activity:   . Days of Exercise per Week:   . Minutes of Exercise per Session:   Stress:   . Feeling of Stress :   Social Connections:   . Frequency of Communication with Friends and Family:   . Frequency of Social Gatherings with Friends and Family:   . Attends Religious Services:   . Active Member of Clubs or Organizations:   . Attends Archivist Meetings:   [name] Marital Status:      Family History: The patient's family history includes Alzheimer's disease in her father; Heart attack in her father; Heart disease in her father; Hyperlipidemia in her brother and mother; Hypertension in her brother, father, and mother. ROS:   Please see the history of present illness.    All other systems reviewed and are negative.  EKGs/Labs/Other Studies Reviewed:    The following studies were reviewed today:  EKG:  EKG ordered and performed  05/27/2020 personally reviewed demonstrates sinus rhythm right bundle branch block no ischemic changes  Recent Labs: 02/16/2020: ALT 11 05/22/2020: Magnesium 1.0; TSH 0.961 05/26/2020: BUN 9; Creatinine, Ser 0.74; Hemoglobin 9.9; Platelets 255; Potassium 3.6; Sodium 139  Recent Lipid Panel    Component Value Date/Time   CHOL 208 (H)  05/22/2020 2122   TRIG 559 (H) 05/22/2020 2122   HDL 37 (L) 05/22/2020 2122   CHOLHDL 5.6 05/22/2020 2122   VLDL UNABLE TO CALCULATE IF TRIGLYCERIDE OVER 400 mg/dL 05/22/2020 2122   LDLCALC UNABLE TO CALCULATE IF TRIGLYCERIDE OVER 400 mg/dL 05/22/2020 2122   LDLDIRECT 91.4 05/22/2020 2122    Physical Exam:    VS:  BP 120/70   Pulse 82   Ht _0  (1.702 m)   Wt 223 lb (101.2 kg)   LMP  (LMP Unknown)   SpO2 96%   BMI 34.93 kg/m     Wt Readings from Last 3 Encounters:  06/07/20 223 lb (101.2 kg)  05/26/20 223 lb 14.4 oz (101.6 kg)  05/13/20 229 lb (103.9 kg)     GEN:  Well nourished, well developed in no acute distress HEENT: Normal NECK: No JVD; No carotid bruits LYMPHATICS: No lymphadenopathy CARDIAC: RRR, no murmurs, rubs, gallops RESPIRATORY:  Clear to auscultation without rales, wheezing or rhonchi  ABDOMEN: Soft, non-tender, non-distended MUSCULOSKELETAL:  No edema; No deformity he still has a healing wound on the right foot from diabetic foot infection and toe amputation SKIN: Warm and dry NEUROLOGIC:  Alert and oriented x 3 PSYCHIATRIC:  Normal affect    Signed, Shirlee More, MD  06/07/2020 10:04 AM    [place]

## 2020-06-06 NOTE — Progress Notes (Signed)
Cardiology Office Note:    Date:  06/07/2020   ID:  [name], DOB [birth year], MRN [id number]  PCP:  Ernestene Kiel, MD  Cardiologist:  Shirlee More, MD    Referring MD: Ernestene Kiel, MD    ASSESSMENT:    1. Coronary artery disease involving native coronary artery of native heart with angina pectoris ([place])   2. Hypertensive heart disease with chronic diastolic congestive heart failure ([place])   3. Mixed hyperlipidemia    PLAN:    In order of problems listed above:  1. Very complex case previous bypass surgery vein grafts are occluded recent ACS and intent for second stage procedure left circumflex coronary artery.  I have contacted Dr. Ellyn Hack interventional radiologist asked to review films and give me an opinion.  Continue medical therapy plan to see her in 4 weeks but if he feels we need to do the procedure I will call and will schedule in the interim as an outpatient. 2. Improved heart failure is compensated blood pressure target continue current medical therapy including beta-blocker and ACE inhibitor 3. Poorly controlled with poorly controlled diabetes continue her statin icosapent ethyl recheck lipids consider PCSK9 4. Diabetes improving managed by her PCP   Next appointment: 1 month   Medication Adjustments/Labs and Tests Ordered: Current medicines are reviewed at length with the patient today.  Concerns regarding medicines are outlined above.  No orders of the defined types were placed in this encounter.  No orders of the defined types were placed in this encounter.   Chief Complaint  Patient presents with  . Follow-up  . Coronary Artery Disease    Recent admission ACS coronary angiography showed graft occlusion and she had complex PCI and stenting native right coronary artery and is awaiting staged stenting of her native left main coronary artery    History of Present Illness:    Jasmine Richardson is a 59 y.o. female with a hx of  CAD, S/P CABG  04/23/16 with post operative mediastinitis and reoperation at [facility] with a myocutaneous pectoral flaps for an infected sternotomy after CABG , diastolic heart failure, T2 DM, hypertension and hyperlipidemia  last seen 05/13/2020 after surgical intervention for diabetic foot infection.Soledad Richardson developed unstable angina high-sensitivity troponin positive coronary angiography showed severe native three-vessel coronary artery disease vein graft to second diagonal first marginal and PDA were occluded and left thoracic artery was patent.  She underwent staged PCI 05/25/2020 which was complex however successful with PCI and stent of the mid and distal right coronary artery commendation for staged left main PCI as an outpatient.   2 wk ago  (05/23/20) 2 wk ago  (05/22/20)   Troponin I (High Sensitivity) <18 ng/L 1,404High Panic  619High Panic   Coronary Diagrams  Diagnostic Dominance: Right  Intervention       Compliance with diet, lifestyle and medications: Yes  She is improved has had no further anginal discomfort feels better.  I approached her about the second PCI staged procedure and she told me that her understanding was that it was optional.  I told her that I think she should be is fully revascularized is possible and I will seek a interventional radiology opinion.  She has had no further angina shortness of breath edema strength and endurance are better for life.  Despite positive troponin she is taking clopidogrel and aspirin and she takes a low intensity statin because of muscle symptoms.  At her next visit I will recheck a lipid profile she  may require PCSK9 therapy Past Medical History:  Diagnosis Date  . Acute chest pain 05/08/2016  . Angina pectoris ([place]) 09/11/2017  . Burping 05/08/2016  . Chronic diastolic heart failure ([place]) 11/06/2016  . Coronary artery disease involving native coronary artery of native heart with angina pectoris ([place]) 09/12/2015   Overview:  PCI and stent of  RCA 2009, last cath 2012 with medical therapy  CABG May 2017  . Emphysema lung ([place]) 09/11/2017  . Essential hypertension 09/12/2015  . GERD (gastroesophageal reflux disease) 05/08/2016  . Hyperlipidemia 09/12/2015  . Mediastinitis 06/28/2016  . Morbid obesity ([place]) 05/08/2016  . S/P CABG (coronary artery bypass graft) 06/03/2016   Overview:  The patient underwent sternal reconstruction on 06/21/16 with pec flaps for mediastinitis from a prior CABG in May 2017. On admission, she was critically ill from sepsis and had altered mental status. She was last seen in clinic on 08/09/16 at which time she was doing well.  . Severe sepsis ([place]) 06/03/2016  . Sinus tachycardia 05/08/2016  . Tobacco use disorder 04/20/2016   Overview:  Quit in May 2017.  [name] Uncontrolled type 2 diabetes mellitus ([place]) 05/08/2016  . Wound, surgical, infected 06/06/2016   Overview:  sternal    Past Surgical History:  Procedure Laterality Date  . AMPUTATION TOE Right 02/20/2020   Procedure: AMPUTATION RIGHT GREAT  TOE;  Surgeon: Felipa Furnace, DPM;  Location: [place];  Service: Podiatry;  Laterality: Right;  . BLADDER SURGERY    . CARDIAC CATHETERIZATION    . CORONARY ARTERY BYPASS GRAFT    . CORONARY STENT INTERVENTION N/A 05/25/2020   Procedure: CORONARY STENT INTERVENTION;  Surgeon: Wellington Hampshire, MD;  Location: [place] [facility];  Service: Cardiovascular;  Laterality: N/A;  . INCISION AND DRAINAGE Right 02/19/2020   Procedure: INCISION AND DRAINAGE;  Surgeon: Trula Slade, DPM;  Location: [place];  Service: Podiatry;  Laterality: Right;  Block done by surgeon  . LEFT HEART CATH AND CORS/GRAFTS ANGIOGRAPHY N/A 05/23/2020   Procedure: LEFT HEART CATH AND CORS/GRAFTS ANGIOGRAPHY;  Surgeon: Nelva Bush, MD;  Location: [place] [facility];  Service: Cardiovascular;  Laterality: N/A;  . METATARSAL HEAD EXCISION Right 05/02/2020   Procedure: FIRST METATARSAL HEAD RESECTION; RIGHT FOOT WOUND CLOSURE;  Surgeon: Landis Martins, DPM;   Location: [place];  Service: Podiatry;  Laterality: Right;  MAC W/LOCAL  . TUBAL LIGATION      Current Medications: Current Meds  Medication Sig  . acetaminophen (TYLENOL) 500 MG tablet Take 2 tablets (1,000 mg total) by mouth every 6 (six) hours as needed.  [name] aspirin EC 81 MG tablet Take 1 tablet (81 mg total) by mouth daily.  . clopidogrel (PLAVIX) 75 MG tablet Take 1 tablet (75 mg total) by mouth daily.  . Cyanocobalamin (VITAMIN B 12 PO) Take 1 tablet by mouth daily.  [name] docusate sodium (COLACE) 100 MG capsule Take 1 capsule (100 mg total) by mouth daily as needed.  . furosemide (LASIX) 40 MG tablet Take 1 tablet by mouth once daily  . glipiZIDE (GLUCOTROL) 10 MG tablet Take 10 mg by mouth 2 (two) times daily.  [name] icosapent Ethyl (VASCEPA) 1 g capsule Take 2 capsules (2 g total) by mouth 2 (two) times daily.  . Insulin Glargine (BASAGLAR KWIKPEN) 100 UNIT/ML Inject 0.2 mLs (20 Units total) into the skin at bedtime.  [name] JANUVIA 100 MG tablet Take 100 mg by mouth daily.  [name] lisinopril (ZESTRIL) 20 MG tablet Take 1  tablet (20 mg total) by mouth daily.  [name] LORazepam (ATIVAN) 1 MG tablet Take 1 mg by mouth as needed for anxiety.  . metFORMIN (GLUCOPHAGE-XR) 500 MG 24 hr tablet Take 1,000 mg by mouth 2 (two) times daily.  . metoprolol succinate (TOPROL-XL) 50 MG 24 hr tablet Take 150 mg by mouth daily.   . nitroGLYCERIN (NITROSTAT) 0.4 MG SL tablet Place 1 tablet (0.4 mg total) under the tongue every 5 (five) minutes x 3 doses as needed for chest pain.  . pantoprazole (PROTONIX) 40 MG tablet Take 1 tablet (40 mg total) by mouth daily.  . promethazine (PHENERGAN) 12.5 MG tablet Take 1 tablet (12.5 mg total) by mouth every 6 (six) hours as needed for nausea or vomiting.  . sulfamethoxazole-trimethoprim (BACTRIM) 400-80 MG tablet Take 1 tablet by mouth 2 (two) times daily.  . vitamin C (ASCORBIC ACID) 250 MG tablet Take 250 mg by mouth daily.  . Vitamin D, Ergocalciferol, (DRISDOL)  50000 units CAPS capsule Take 50,000 Units by mouth every 7 (seven) days. Saturdays     Allergies:   Tramadol and Codeine   Social History   Socioeconomic History  . Marital status: Married    Spouse name: Not on file  . Number of children: Not on file  . Years of education: Not on file  . Highest education level: Not on file  Occupational History  . Not on file  Tobacco Use  . Smoking status: Former Smoker    Quit date: 05/2016    Years since quitting: 4.0  . Smokeless tobacco: Never Used  Vaping Use  . Vaping Use: Never used  Substance and Sexual Activity  . Alcohol use: No  . Drug use: No  . Sexual activity: Yes  Other Topics Concern  . Not on file  Social History Narrative  . Not on file   Social Determinants of Health   Financial Resource Strain:   . Difficulty of Paying Living Expenses:   Food Insecurity:   . Worried About Charity fundraiser in the Last Year:   . Arboriculturist in the Last Year:   Transportation Needs:   . Film/video editor (Medical):   [name] Lack of Transportation (Non-Medical):   Physical Activity:   . Days of Exercise per Week:   . Minutes of Exercise per Session:   Stress:   . Feeling of Stress :   Social Connections:   . Frequency of Communication with Friends and Family:   . Frequency of Social Gatherings with Friends and Family:   . Attends Religious Services:   . Active Member of Clubs or Organizations:   . Attends Archivist Meetings:   [name] Marital Status:      Family History: The patient's family history includes Alzheimer's disease in her father; Heart attack in her father; Heart disease in her father; Hyperlipidemia in her brother and mother; Hypertension in her brother, father, and mother. ROS:   Please see the history of present illness.    All other systems reviewed and are negative.  EKGs/Labs/Other Studies Reviewed:    The following studies were reviewed today:  EKG:  EKG ordered and performed  05/27/2020 personally reviewed demonstrates sinus rhythm right bundle branch block no ischemic changes  Recent Labs: 02/16/2020: ALT 11 05/22/2020: Magnesium 1.0; TSH 0.961 05/26/2020: BUN 9; Creatinine, Ser 0.74; Hemoglobin 9.9; Platelets 255; Potassium 3.6; Sodium 139  Recent Lipid Panel    Component Value Date/Time   CHOL 208 (H)  05/22/2020 2122   TRIG 559 (H) 05/22/2020 2122   HDL 37 (L) 05/22/2020 2122   CHOLHDL 5.6 05/22/2020 2122   VLDL UNABLE TO CALCULATE IF TRIGLYCERIDE OVER 400 mg/dL 05/22/2020 2122   LDLCALC UNABLE TO CALCULATE IF TRIGLYCERIDE OVER 400 mg/dL 05/22/2020 2122   LDLDIRECT 91.4 05/22/2020 2122    Physical Exam:    VS:  BP 120/70   Pulse 82   Ht _0  (1.702 m)   Wt 223 lb (101.2 kg)   LMP  (LMP Unknown)   SpO2 96%   BMI 34.93 kg/m     Wt Readings from Last 3 Encounters:  06/07/20 223 lb (101.2 kg)  05/26/20 223 lb 14.4 oz (101.6 kg)  05/13/20 229 lb (103.9 kg)     GEN:  Well nourished, well developed in no acute distress HEENT: Normal NECK: No JVD; No carotid bruits LYMPHATICS: No lymphadenopathy CARDIAC: RRR, no murmurs, rubs, gallops RESPIRATORY:  Clear to auscultation without rales, wheezing or rhonchi  ABDOMEN: Soft, non-tender, non-distended MUSCULOSKELETAL:  No edema; No deformity he still has a healing wound on the right foot from diabetic foot infection and toe amputation SKIN: Warm and dry NEUROLOGIC:  Alert and oriented x 3 PSYCHIATRIC:  Normal affect    Signed, Shirlee More, MD  06/07/2020 10:04 AM    [place]

## 2020-06-07 ENCOUNTER — Ambulatory Visit: Payer: Commercial Managed Care - PPO | Admitting: Cardiology

## 2020-06-07 ENCOUNTER — Encounter: Payer: Self-pay | Admitting: Cardiology

## 2020-06-07 ENCOUNTER — Other Ambulatory Visit: Payer: Self-pay

## 2020-06-07 VITALS — BP 120/70 | HR 82 | Ht 67.0 in | Wt 223.0 lb

## 2020-06-07 DIAGNOSIS — I110 Hypertensive heart disease with heart failure: Secondary | ICD-10-CM

## 2020-06-07 DIAGNOSIS — I25119 Atherosclerotic heart disease of native coronary artery with unspecified angina pectoris: Secondary | ICD-10-CM | POA: Diagnosis not present

## 2020-06-07 DIAGNOSIS — I5032 Chronic diastolic (congestive) heart failure: Secondary | ICD-10-CM

## 2020-06-07 DIAGNOSIS — E782 Mixed hyperlipidemia: Secondary | ICD-10-CM | POA: Diagnosis not present

## 2020-06-07 NOTE — Patient Instructions (Addendum)
Medication Instructions:  Your physician recommends that you continue on your current medications as directed. Please refer to the Current Medication list given to you today.  *If you need a refill on your cardiac medications before your next appointment, please call your pharmacy*   Lab Work: None If you have labs (blood work) drawn today and your tests are completely normal, you will receive your results only by: [name] MyChart Message (if you have MyChart) OR . A paper copy in the mail If you have any lab test that is abnormal or we need to change your treatment, we will call you to review the results.   Testing/Procedures:    [place] [address] Dept: [phone] Loc: [phone]  Doll Frazee  06/07/2020  You are scheduled for a Cardiac Catheterization on Wednesday, July 7 with Dr. Glenetta Hew.  1. Please arrive at the [facility] (Main Entrance A) at [facility]: [address] [place], [place] [address] at 6:30 AM (This time is two hours before your procedure to ensure your preparation). Free valet parking service is available.   Special note: Every effort is made to have your procedure done on time. Please understand that emergencies sometimes delay scheduled procedures.  2. Diet: Do not eat solid foods after midnight.  The patient may have clear liquids until 5am upon the day of the procedure.  3. Labs: You will need to have blood drawn on 06-16-20 or 06-17-20  4. Medication instructions in preparation for your procedure:   Contrast Allergy: No  Stop taking, Lasix (Furosemide)  Wednesday, July 7,  Please take only half of your scheduled insulin on the night before the procedure.   Do not take Diabetes Med Glucophage (Metformin) on the day of the procedure and HOLD 48 HOURS AFTER THE PROCEDURE.  On the morning of your procedure, take your  Plavix/Clopidogrel and any morning medicines NOT listed above.  You may use sips of water.  5. Plan for one night stay--bring personal belongings. 6. Bring a current list of your medications and current insurance cards. 7. You MUST have a responsible person to drive you home. 8. Someone MUST be with you the first 24 hours after you arrive home or your discharge will be delayed. 9. Please wear clothes that are easy to get on and off and wear slip-on shoes.  Thank you for allowing [place] to care for you!   -- [facility]    Follow-Up: At [facility], you and your health needs are our priority.  As part of our continuing mission to provide you with exceptional heart care, we have created designated Provider Care Teams.  These Care Teams include your primary Cardiologist (physician) and Advanced Practice Providers (APPs -  Physician Assistants and Nurse Practitioners) who all work together to provide you with the care you need, when you need it.  We recommend signing up for the patient portal called "MyChart".  Sign up information is provided on this After Visit Summary.  MyChart is used to connect with patients for Virtual Visits (Telemedicine).  Patients are able to view lab/test results, encounter notes, upcoming appointments, etc.  Non-urgent messages can be sent to your provider as well.   To learn more about what you can do with MyChart, go to [email or web address].    Your next appointment:   4 weeks  The format for your next appointment:   In  Person  Provider:   Shirlee More, MD   Other Instructions

## 2020-06-07 NOTE — Addendum Note (Signed)
Addended by: Shirlee More on: 06/07/2020 04:50 PM   Modules accepted: Orders, SmartSet

## 2020-06-07 NOTE — Addendum Note (Signed)
Addended by: Resa Miner I on: 06/07/2020 10:25 AM   Modules accepted: Orders

## 2020-06-08 ENCOUNTER — Ambulatory Visit (INDEPENDENT_AMBULATORY_CARE_PROVIDER_SITE_OTHER): Payer: Commercial Managed Care - PPO | Admitting: Sports Medicine

## 2020-06-08 ENCOUNTER — Encounter: Payer: Self-pay | Admitting: Sports Medicine

## 2020-06-08 DIAGNOSIS — E1165 Type 2 diabetes mellitus with hyperglycemia: Secondary | ICD-10-CM

## 2020-06-08 DIAGNOSIS — Z9889 Other specified postprocedural states: Secondary | ICD-10-CM

## 2020-06-08 DIAGNOSIS — Z89419 Acquired absence of unspecified great toe: Secondary | ICD-10-CM

## 2020-06-08 NOTE — Progress Notes (Signed)
Subjective: Jasmine Richardson is a 59 y.o. female patient seen today in office for POV # 5 (DOS 05/02/2020), S/P revision of amputation stump site right foot with removal of additional bone at the first metatarsal head and primary wound closure.  Patient reports that she is doing good but will be admitted on the week of July 4 for a another heart stent.  No other issues noted.   Fasting blood sugar not recorded.  Patient assisted by husband again this visit.  Patient Active Problem List   Diagnosis Date Noted  . Unstable angina ([place]) 05/22/2020  . NSTEMI (non-ST elevated myocardial infarction) ([place]) 05/22/2020  . Diabetic foot ulcer ([place]) 02/16/2020  . Angina pectoris ([place]) 09/11/2017  . Emphysema lung ([place]) 09/11/2017  . Chronic diastolic heart failure ([place]) 11/06/2016  . Mediastinitis 06/28/2016  . Wound, surgical, infected 06/06/2016  . S/P CABG (coronary artery bypass graft) 06/03/2016  . GERD (gastroesophageal reflux disease) 05/08/2016  . Morbid obesity ([place]) 05/08/2016  . Uncontrolled type 2 diabetes mellitus ([place]) 05/08/2016  . Tobacco use disorder 04/20/2016  . Coronary artery disease involving native coronary artery of native heart with angina pectoris ([place]) 09/12/2015  . Essential hypertension 09/12/2015  . Hyperlipidemia 09/12/2015    Current Outpatient Medications on File Prior to Visit  Medication Sig Dispense Refill  . acetaminophen (TYLENOL) 500 MG tablet Take 2 tablets (1,000 mg total) by mouth every 6 (six) hours as needed. 100 tablet 2  . aspirin EC 81 MG tablet Take 1 tablet (81 mg total) by mouth daily. 90 tablet 3  . clopidogrel (PLAVIX) 75 MG tablet Take 1 tablet (75 mg total) by mouth daily. 30 tablet 10  . Cyanocobalamin (VITAMIN B 12 PO) Take 1 tablet by mouth daily.    [name] docusate sodium (COLACE) 100 MG capsule Take 1 capsule (100 mg total) by mouth daily as needed. 30 capsule 2  . furosemide (LASIX) 40 MG tablet Take 1 tablet by mouth once daily 90 tablet 2  .  glipiZIDE (GLUCOTROL) 10 MG tablet Take 10 mg by mouth 2 (two) times daily.    [name] icosapent Ethyl (VASCEPA) 1 g capsule Take 2 capsules (2 g total) by mouth 2 (two) times daily. 120 capsule 4  . Insulin Glargine (BASAGLAR KWIKPEN) 100 UNIT/ML Inject 0.2 mLs (20 Units total) into the skin at bedtime. 15 mL 0  . JANUVIA 100 MG tablet Take 100 mg by mouth daily.    [name] lisinopril (ZESTRIL) 20 MG tablet Take 1 tablet (20 mg total) by mouth daily. 60 tablet 3  . LORazepam (ATIVAN) 1 MG tablet Take 1 mg by mouth as needed for anxiety.    . metFORMIN (GLUCOPHAGE-XR) 500 MG 24 hr tablet Take 1,000 mg by mouth 2 (two) times daily.    . metoprolol succinate (TOPROL-XL) 50 MG 24 hr tablet Take 150 mg by mouth daily.     . nitroGLYCERIN (NITROSTAT) 0.4 MG SL tablet Place 1 tablet (0.4 mg total) under the tongue every 5 (five) minutes x 3 doses as needed for chest pain. 30 tablet 11  . pantoprazole (PROTONIX) 40 MG tablet Take 1 tablet (40 mg total) by mouth daily. 60 tablet 3  . promethazine (PHENERGAN) 12.5 MG tablet Take 1 tablet (12.5 mg total) by mouth every 6 (six) hours as needed for nausea or vomiting. 30 tablet 0  . sulfamethoxazole-trimethoprim (BACTRIM) 400-80 MG tablet Take 1 tablet by mouth 2 (two) times daily. 28 tablet 0  . vitamin C (ASCORBIC ACID) 250  MG tablet Take 250 mg by mouth daily.    . Vitamin D, Ergocalciferol, (DRISDOL) 50000 units CAPS capsule Take 50,000 Units by mouth every 7 (seven) days. Saturdays     No current facility-administered medications on file prior to visit.    Allergies  Allergen Reactions  . Tramadol     Hallucinations  . Codeine Rash    Objective: There were no vitals filed for this visit.  General: No acute distress, AAOx3  Right foot: Remaining staples intact with no gapping or dehiscence at surgical site, mild swelling to the amputation stump site, decreased blanchable erythema, no warmth, no other signs of infection noted, Capillary fill time <5 seconds  in all lesser digits, protective sensation severely diminished.  No pain or crepitation with range of motion right foot.  No pain with calf compression.  Assessment and Plan:  Problem List Items Addressed This Visit      Endocrine   Uncontrolled type 2 diabetes mellitus ([place])    Other Visit Diagnoses    S/P foot surgery, right    -  Primary   History of amputation of hallux ([place])           -Patient seen and evaluated -Remaining staples removed -Applied Steri-Strips and dry sterile dressing to surgical site right foot secured with ACE wrap and stockinet  -Advised patient may remove on tomorrow for shower and allow Steri-Strips to fall off on their own and redress with a clean sock and to wear flat surgical shoe as provided at this visit -Advised patient to limit activity to tolerance and may attempt to drive but must remove surgical shoe and wear a bedroom slipper to do so -Advised patient to elevate as necessary  -Advised patient to limit activity -Continue with Tylenol as needed for pain -Return to office in 2 to 3 weeks or sooner if problems or issues arise.  Advised patient after next visit will benefit from being casted or molded for a custom insole. Landis Martins, DPM

## 2020-06-13 ENCOUNTER — Telehealth: Payer: Self-pay | Admitting: Cardiology

## 2020-06-13 NOTE — Telephone Encounter (Signed)
Patient would like to switch her COVID test from Saturday 7/3 to either Monday 7/5 or Tuesday 7/6.

## 2020-06-13 NOTE — Telephone Encounter (Signed)
Spoke with the patient just now and got her re-scheduled for this COVID swab on 06/21/20. She verbalizes understanding and thanks me for the call back.    Encouraged patient to call back with any questions or concerns.

## 2020-06-15 ENCOUNTER — Telehealth: Payer: Self-pay

## 2020-06-15 NOTE — Telephone Encounter (Signed)
Pt contacted pre-catheterization scheduled at [facility] for: 06/22/20.  Verified arrival time and place: [facility]) at: 6:30 am.   Pt to have her labs drawn 06/16/20.   Pt is unvaccinated for COVID and will cancel her COVID test due to updated guidelines but plans to social distance and self quarantine 4 days prior to her Cath and will call if she develops any symptoms or any known exposure.   No solid food after midnight prior to cath, clear liquids until 5 AM day of procedure.   AM meds can be  taken pre-cath with sips of water including: ASA 81 mg Plavix 75 mg  Except...Marland KitchenMarland KitchenWill hold her Diabetes meds with only 1/2 her insulin dose the night before.   Will hold Metformin for 48 hours after also.    Confirmed patient has responsible adult to drive home post procedure and observe 24 hours after arriving home:   You are allowed ONE visitor in the waiting room during your procedure. Both you and your visitor must wear masks.      COVID-19 Pre-Screening Questions:  . In the past 7 to 10 days have you had a new cough, shortness of breath, headache, congestion, fever (100 or greater) unexplained body aches, new sore throat, or sudden loss of taste or sense of smell? [name] In the past 7 to 10 days have you been around anyone with known Covid 19?

## 2020-06-16 ENCOUNTER — Other Ambulatory Visit: Payer: Self-pay

## 2020-06-16 ENCOUNTER — Encounter: Payer: Self-pay | Admitting: Sports Medicine

## 2020-06-16 ENCOUNTER — Other Ambulatory Visit: Payer: Self-pay | Admitting: Sports Medicine

## 2020-06-16 ENCOUNTER — Ambulatory Visit (INDEPENDENT_AMBULATORY_CARE_PROVIDER_SITE_OTHER): Payer: Commercial Managed Care - PPO

## 2020-06-16 ENCOUNTER — Ambulatory Visit (INDEPENDENT_AMBULATORY_CARE_PROVIDER_SITE_OTHER): Payer: Commercial Managed Care - PPO | Admitting: Sports Medicine

## 2020-06-16 DIAGNOSIS — E1165 Type 2 diabetes mellitus with hyperglycemia: Secondary | ICD-10-CM

## 2020-06-16 DIAGNOSIS — Z9889 Other specified postprocedural states: Secondary | ICD-10-CM | POA: Diagnosis not present

## 2020-06-16 DIAGNOSIS — M79671 Pain in right foot: Secondary | ICD-10-CM

## 2020-06-16 DIAGNOSIS — Z89419 Acquired absence of unspecified great toe: Secondary | ICD-10-CM

## 2020-06-16 NOTE — Progress Notes (Signed)
Subjective: Jasmine Richardson is a 59 y.o. female patient seen today in office for POV # 6 (DOS 05/02/2020), S/P revision of amputation stump site right foot with removal of additional bone at the first metatarsal head and primary wound closure.  Patient reports that she is worried because there has been some swelling and redness and wanted to have it checked especially since she is scheduled to go for her heart procedure on next week.  No other issues noted.   Fasting blood sugar not recorded.    Patient Active Problem List   Diagnosis Date Noted  . Unstable angina ([place]) 05/22/2020  . NSTEMI (non-ST elevated myocardial infarction) ([place]) 05/22/2020  . Diabetic foot ulcer ([place]) 02/16/2020  . Angina pectoris ([place]) 09/11/2017  . Emphysema lung ([place]) 09/11/2017  . Chronic diastolic heart failure ([place]) 11/06/2016  . Mediastinitis 06/28/2016  . Wound, surgical, infected 06/06/2016  . S/P CABG (coronary artery bypass graft) 06/03/2016  . GERD (gastroesophageal reflux disease) 05/08/2016  . Morbid obesity ([place]) 05/08/2016  . Uncontrolled type 2 diabetes mellitus ([place]) 05/08/2016  . Tobacco use disorder 04/20/2016  . Coronary artery disease involving native coronary artery of native heart with angina pectoris ([place]) 09/12/2015  . Essential hypertension 09/12/2015  . Hyperlipidemia 09/12/2015    Current Outpatient Medications on File Prior to Visit  Medication Sig Dispense Refill  . acetaminophen (TYLENOL) 500 MG tablet Take 2 tablets (1,000 mg total) by mouth every 6 (six) hours as needed. (Patient taking differently: Take 1,000 mg by mouth every 6 (six) hours as needed for moderate pain. ) 100 tablet 2  . aspirin EC 81 MG tablet Take 1 tablet (81 mg total) by mouth daily. 90 tablet 3  . clopidogrel (PLAVIX) 75 MG tablet Take 1 tablet (75 mg total) by mouth daily. 30 tablet 10  . Cyanocobalamin (VITAMIN B 12 PO) Take 1 tablet by mouth daily.    [name] docusate sodium (COLACE) 100 MG capsule Take 1  capsule (100 mg total) by mouth daily as needed. (Patient not taking: Reported on 06/09/2020) 30 capsule 2  . furosemide (LASIX) 40 MG tablet Take 1 tablet by mouth once daily (Patient taking differently: Take 40 mg by mouth daily. ) 90 tablet 2  . glipiZIDE (GLUCOTROL) 10 MG tablet Take 10 mg by mouth 2 (two) times daily.    [name] icosapent Ethyl (VASCEPA) 1 g capsule Take 2 capsules (2 g total) by mouth 2 (two) times daily. 120 capsule 4  . Insulin Glargine (BASAGLAR KWIKPEN) 100 UNIT/ML Inject 0.2 mLs (20 Units total) into the skin at bedtime. 15 mL 0  . JANUVIA 100 MG tablet Take 100 mg by mouth daily.    [name] lisinopril (ZESTRIL) 20 MG tablet Take 1 tablet (20 mg total) by mouth daily. 60 tablet 3  . LORazepam (ATIVAN) 1 MG tablet Take 1 mg by mouth 2 (two) times daily as needed for anxiety.     . metFORMIN (GLUCOPHAGE-XR) 500 MG 24 hr tablet Take 1,000 mg by mouth 2 (two) times daily.    . metoprolol succinate (TOPROL-XL) 50 MG 24 hr tablet Take 150 mg by mouth daily.     . nitroGLYCERIN (NITROSTAT) 0.4 MG SL tablet Place 1 tablet (0.4 mg total) under the tongue every 5 (five) minutes x 3 doses as needed for chest pain. 30 tablet 11  . pantoprazole (PROTONIX) 40 MG tablet Take 1 tablet (40 mg total) by mouth daily. 60 tablet 3  . promethazine (PHENERGAN) 12.5 MG tablet Take 1  tablet (12.5 mg total) by mouth every 6 (six) hours as needed for nausea or vomiting. 30 tablet 0  . sulfamethoxazole-trimethoprim (BACTRIM) 400-80 MG tablet Take 1 tablet by mouth 2 (two) times daily. (Patient not taking: Reported on 06/09/2020) 28 tablet 0  . vitamin C (ASCORBIC ACID) 250 MG tablet Take 250 mg by mouth daily.    . Vitamin D, Ergocalciferol, (DRISDOL) 50000 units CAPS capsule Take 50,000 Units by mouth every 7 (seven) days. Saturdays     No current facility-administered medications on file prior to visit.    Allergies  Allergen Reactions  . Tramadol     Hallucinations  . Codeine Rash     Objective: There were no vitals filed for this visit.  General: No acute distress, AAOx3  Right foot: Incision well-healed at surgical site, mild swelling to the amputation stump site, blanchable erythema with no signs of cellulitis likely from swelling, no warmth, no other signs of infection noted, Capillary fill time <5 seconds in all lesser digits, protective sensation severely diminished.  No pain or crepitation with range of motion right foot.  No pain with calf compression.  X-ray right foot consistent with partial first ray amputation status and mild soft tissue swelling  Assessment and Plan:  Problem List Items Addressed This Visit      Endocrine   Uncontrolled type 2 diabetes mellitus ([place])    Other Visit Diagnoses    S/P foot surgery, right    -  Primary   History of amputation of hallux ([place])           -Patient seen and evaluated -X-rays reviewed -Advised patient that swelling at amputation stump site is normal and blanchable erythema is also normal due to healing -Continue with postoperative shoe to prevent rubbing to amputation stump site and a clean sock to the area -Advised patient to elevate as necessary  -Advised patient to limit activity to tolerance to prevent increased swelling and redness -Continue with Tylenol as needed for pain -Return to office in as scheduled for amputation stump site check and for discussion of cast or mold for insole with toe filler   Landis Martins, DPM

## 2020-06-17 ENCOUNTER — Telehealth: Payer: Self-pay | Admitting: *Deleted

## 2020-06-17 NOTE — Telephone Encounter (Signed)
Spoke to the patient just now and let her know that I did call over to the [facility] and they let me know that they are no longer requiring COVID swabs unless the patient will be going under general anesthesia. She verbalizes understanding and thanks me for the call back.    Encouraged patient to call back with any questions or concerns.

## 2020-06-17 NOTE — Telephone Encounter (Signed)
Pt was here for blood work and said that someone from the hospital called and told her she did not need a covid test before her surgery. Please advise

## 2020-06-18 ENCOUNTER — Other Ambulatory Visit (HOSPITAL_COMMUNITY): Payer: Commercial Managed Care - PPO

## 2020-06-18 LAB — BASIC METABOLIC PANEL
BUN/Creatinine Ratio: 18 (ref 9–23)
BUN: 13 mg/dL (ref 6–24)
CO2: 20 mmol/L (ref 20–29)
Calcium: 9.5 mg/dL (ref 8.7–10.2)
Chloride: 101 mmol/L (ref 96–106)
Creatinine, Ser: 0.72 mg/dL (ref 0.57–1.00)
GFR calc Af Amer: 106 mL/min/{1.73_m2} (ref >59)
GFR calc non Af Amer: 92 mL/min/{1.73_m2} (ref >59)
Glucose: 235 mg/dL — ABNORMAL HIGH (ref 65–99)
Potassium: 4.7 mmol/L (ref 3.5–5.2)
Sodium: 135 mmol/L (ref 134–144)

## 2020-06-18 LAB — CBC
Hematocrit: 32.4 % — ABNORMAL LOW (ref 34.0–46.6)
Hemoglobin: 10.4 g/dL — ABNORMAL LOW (ref 11.1–15.9)
MCH: 27.6 pg (ref 26.6–33.0)
MCHC: 32.1 g/dL (ref 31.5–35.7)
MCV: 86 fL (ref 79–97)
Platelets: 269 10*3/uL (ref 150–450)
RBC: 3.77 x10E6/uL (ref 3.77–5.28)
RDW: 14.2 % (ref 11.7–15.4)
WBC: 7.9 10*3/uL (ref 3.4–10.8)

## 2020-06-21 ENCOUNTER — Other Ambulatory Visit (HOSPITAL_COMMUNITY): Payer: Commercial Managed Care - PPO

## 2020-06-21 ENCOUNTER — Telehealth: Payer: Self-pay

## 2020-06-21 NOTE — Telephone Encounter (Signed)
Spoke with patient regarding results and recommendation.  Patient verbalizes understanding and is agreeable to plan of care. Advised patient to call back with any issues or concerns.  

## 2020-06-21 NOTE — Telephone Encounter (Signed)
Left message on patients voicemail to please return our call.   

## 2020-06-21 NOTE — Telephone Encounter (Signed)
Follow up ° ° °Patient is returning call. Please call. °

## 2020-06-21 NOTE — Telephone Encounter (Signed)
Forwarded to [employer]

## 2020-06-22 ENCOUNTER — Ambulatory Visit (HOSPITAL_COMMUNITY)
Admission: RE | Admit: 2020-06-22 | Discharge: 2020-06-23 | Disposition: A | Discharge status: Home / Self Care | Payer: Commercial Managed Care - PPO | Attending: Cardiology | Admitting: Cardiology

## 2020-06-22 ENCOUNTER — Encounter (HOSPITAL_COMMUNITY): Payer: Self-pay | Admitting: Cardiology

## 2020-06-22 ENCOUNTER — Encounter (HOSPITAL_COMMUNITY)
Admission: RE | Disposition: A | Discharge status: Home / Self Care | Payer: Self-pay | Source: Home / Self Care | Attending: Cardiology

## 2020-06-22 ENCOUNTER — Other Ambulatory Visit: Payer: Self-pay

## 2020-06-22 DIAGNOSIS — Z87891 Personal history of nicotine dependence: Secondary | ICD-10-CM | POA: Diagnosis not present

## 2020-06-22 DIAGNOSIS — E119 Type 2 diabetes mellitus without complications: Secondary | ICD-10-CM | POA: Insufficient documentation

## 2020-06-22 DIAGNOSIS — Z955 Presence of coronary angioplasty implant and graft: Secondary | ICD-10-CM | POA: Insufficient documentation

## 2020-06-22 DIAGNOSIS — I110 Hypertensive heart disease with heart failure: Secondary | ICD-10-CM | POA: Insufficient documentation

## 2020-06-22 DIAGNOSIS — I5032 Chronic diastolic (congestive) heart failure: Secondary | ICD-10-CM | POA: Diagnosis not present

## 2020-06-22 DIAGNOSIS — E11621 Type 2 diabetes mellitus with foot ulcer: Secondary | ICD-10-CM | POA: Diagnosis present

## 2020-06-22 DIAGNOSIS — Z79899 Other long term (current) drug therapy: Secondary | ICD-10-CM | POA: Insufficient documentation

## 2020-06-22 DIAGNOSIS — I10 Essential (primary) hypertension: Secondary | ICD-10-CM | POA: Diagnosis present

## 2020-06-22 DIAGNOSIS — E782 Mixed hyperlipidemia: Secondary | ICD-10-CM | POA: Diagnosis not present

## 2020-06-22 DIAGNOSIS — E785 Hyperlipidemia, unspecified: Secondary | ICD-10-CM | POA: Diagnosis not present

## 2020-06-22 DIAGNOSIS — Z7902 Long term (current) use of antithrombotics/antiplatelets: Secondary | ICD-10-CM | POA: Diagnosis not present

## 2020-06-22 DIAGNOSIS — Z951 Presence of aortocoronary bypass graft: Secondary | ICD-10-CM | POA: Diagnosis not present

## 2020-06-22 DIAGNOSIS — K219 Gastro-esophageal reflux disease without esophagitis: Secondary | ICD-10-CM | POA: Insufficient documentation

## 2020-06-22 DIAGNOSIS — Z7982 Long term (current) use of aspirin: Secondary | ICD-10-CM | POA: Diagnosis not present

## 2020-06-22 DIAGNOSIS — E1165 Type 2 diabetes mellitus with hyperglycemia: Secondary | ICD-10-CM | POA: Diagnosis present

## 2020-06-22 DIAGNOSIS — Z6835 Body mass index (BMI) 35.0-35.9, adult: Secondary | ICD-10-CM | POA: Insufficient documentation

## 2020-06-22 DIAGNOSIS — I25119 Atherosclerotic heart disease of native coronary artery with unspecified angina pectoris: Secondary | ICD-10-CM | POA: Diagnosis present

## 2020-06-22 DIAGNOSIS — IMO0002 Reserved for concepts with insufficient information to code with codable children: Secondary | ICD-10-CM | POA: Diagnosis present

## 2020-06-22 DIAGNOSIS — Z794 Long term (current) use of insulin: Secondary | ICD-10-CM | POA: Diagnosis not present

## 2020-06-22 HISTORY — PX: CORONARY STENT INTERVENTION: CATH118234

## 2020-06-22 HISTORY — PX: CORONARY ULTRASOUND/IVUS: CATH118244

## 2020-06-22 LAB — GLUCOSE, CAPILLARY
Glucose-Capillary: 189 mg/dL — ABNORMAL HIGH (ref 70–99)
Glucose-Capillary: 200 mg/dL — ABNORMAL HIGH (ref 70–99)
Glucose-Capillary: 139 mg/dL — ABNORMAL HIGH (ref 70–99)
Glucose-Capillary: 243 mg/dL — ABNORMAL HIGH (ref 70–99)
Glucose-Capillary: 223 mg/dL — ABNORMAL HIGH (ref 70–99)

## 2020-06-22 LAB — POCT ACTIVATED CLOTTING TIME
Activated Clotting Time: 274 seconds
Activated Clotting Time: 274 seconds
Activated Clotting Time: 202 seconds
Activated Clotting Time: 224 seconds
Activated Clotting Time: 180 seconds

## 2020-06-22 SURGERY — CORONARY STENT INTERVENTION
Anesthesia: LOCAL

## 2020-06-22 MED ORDER — LIDOCAINE HCL (PF) 1 % IJ SOLN
INTRAMUSCULAR | Status: AC
  Filled 2020-06-22: qty 30

## 2020-06-22 MED ORDER — MIDAZOLAM HCL 2 MG/2ML IJ SOLN
INTRAMUSCULAR | Status: AC
  Filled 2020-06-22: qty 2

## 2020-06-22 MED ORDER — SODIUM CHLORIDE 0.9% FLUSH
3.0000 mL | Freq: Two times a day (BID) | INTRAVENOUS | Status: DC
  Administered 2020-06-22: 3 mL via INTRAVENOUS

## 2020-06-22 MED ORDER — HEPARIN (PORCINE) IN NACL 1000-0.9 UT/500ML-% IV SOLN
INTRAVENOUS | Status: AC
  Filled 2020-06-22: qty 1000

## 2020-06-22 MED ORDER — DOCUSATE SODIUM 100 MG PO CAPS: 100.0000 mg | ORAL_CAPSULE | Freq: Every day | ORAL | Status: DC | PRN

## 2020-06-22 MED ORDER — ASPIRIN EC 81 MG PO TBEC
81.0000 mg | DELAYED_RELEASE_TABLET | Freq: Every day | ORAL | Status: DC
  Administered 2020-06-23: 81 mg via ORAL
  Filled 2020-06-22: qty 1

## 2020-06-22 MED ORDER — ACETAMINOPHEN 500 MG PO TABS
1000.0000 mg | ORAL_TABLET | Freq: Four times a day (QID) | ORAL | Status: DC | PRN
  Administered 2020-06-23: 1000 mg via ORAL
  Filled 2020-06-22: qty 2

## 2020-06-22 MED ORDER — ONDANSETRON HCL 4 MG/2ML IJ SOLN: 4.0000 mg | Freq: Four times a day (QID) | INTRAMUSCULAR | Status: DC | PRN

## 2020-06-22 MED ORDER — CLOPIDOGREL BISULFATE 75 MG PO TABS
75.0000 mg | ORAL_TABLET | Freq: Once | ORAL | Status: AC
  Administered 2020-06-22: 75 mg via ORAL
  Filled 2020-06-22: qty 1

## 2020-06-22 MED ORDER — FENTANYL CITRATE (PF) 100 MCG/2ML IJ SOLN
INTRAMUSCULAR | Status: DC | PRN
  Administered 2020-06-22 (×2): 25 ug via INTRAVENOUS
  Administered 2020-06-22: 50 ug via INTRAVENOUS

## 2020-06-22 MED ORDER — METFORMIN HCL ER 500 MG PO TB24: 1000.0000 mg | ORAL_TABLET | Freq: Two times a day (BID) | ORAL | Status: DC

## 2020-06-22 MED ORDER — SODIUM CHLORIDE 0.9 % IV SOLN: INTRAVENOUS | Status: AC

## 2020-06-22 MED ORDER — FUROSEMIDE 40 MG PO TABS
40.0000 mg | ORAL_TABLET | Freq: Every day | ORAL | Status: DC
  Administered 2020-06-23: 40 mg via ORAL
  Filled 2020-06-22: qty 1

## 2020-06-22 MED ORDER — FENTANYL CITRATE (PF) 100 MCG/2ML IJ SOLN
INTRAMUSCULAR | Status: AC
  Filled 2020-06-22: qty 2

## 2020-06-22 MED ORDER — LIDOCAINE HCL (PF) 1 % IJ SOLN
INTRAMUSCULAR | Status: DC | PRN
  Administered 2020-06-22: 10 mL

## 2020-06-22 MED ORDER — ASPIRIN 81 MG PO CHEW: 81.0000 mg | CHEWABLE_TABLET | ORAL | Status: DC

## 2020-06-22 MED ORDER — INSULIN GLARGINE 100 UNIT/ML ~~LOC~~ SOLN
20.0000 [IU] | Freq: Every day | SUBCUTANEOUS | Status: DC
  Administered 2020-06-22: 20 [IU] via SUBCUTANEOUS
  Filled 2020-06-22 (×2): qty 0.2

## 2020-06-22 MED ORDER — LINAGLIPTIN 5 MG PO TABS
5.0000 mg | ORAL_TABLET | Freq: Every day | ORAL | Status: DC
  Administered 2020-06-23: 5 mg via ORAL
  Filled 2020-06-22: qty 1

## 2020-06-22 MED ORDER — HYDRALAZINE HCL 20 MG/ML IJ SOLN: 10.0000 mg | INTRAMUSCULAR | Status: AC | PRN

## 2020-06-22 MED ORDER — NITROGLYCERIN 0.4 MG SL SUBL: 0.4000 mg | SUBLINGUAL_TABLET | SUBLINGUAL | Status: DC | PRN

## 2020-06-22 MED ORDER — LORAZEPAM 1 MG PO TABS: 1.0000 mg | ORAL_TABLET | Freq: Two times a day (BID) | ORAL | Status: DC | PRN

## 2020-06-22 MED ORDER — ICOSAPENT ETHYL 1 G PO CAPS
2.0000 g | ORAL_CAPSULE | Freq: Two times a day (BID) | ORAL | Status: DC
  Administered 2020-06-22 – 2020-06-23 (×2): 2 g via ORAL
  Filled 2020-06-22 (×2): qty 2

## 2020-06-22 MED ORDER — SODIUM CHLORIDE 0.9 % IV SOLN: 250.0000 mL | INTRAVENOUS | Status: DC | PRN

## 2020-06-22 MED ORDER — IOHEXOL 350 MG/ML SOLN
INTRAVENOUS | Status: AC
  Filled 2020-06-22: qty 1

## 2020-06-22 MED ORDER — NITROGLYCERIN 1 MG/10 ML FOR IR/CATH LAB
INTRA_ARTERIAL | Status: AC
  Filled 2020-06-22: qty 10

## 2020-06-22 MED ORDER — LISINOPRIL 20 MG PO TABS
20.0000 mg | ORAL_TABLET | Freq: Every day | ORAL | Status: DC
  Administered 2020-06-23: 20 mg via ORAL
  Filled 2020-06-22: qty 1

## 2020-06-22 MED ORDER — PROMETHAZINE HCL 25 MG PO TABS: 12.5000 mg | ORAL_TABLET | Freq: Four times a day (QID) | ORAL | Status: DC | PRN

## 2020-06-22 MED ORDER — GLIPIZIDE 10 MG PO TABS
10.0000 mg | ORAL_TABLET | Freq: Two times a day (BID) | ORAL | Status: DC
  Administered 2020-06-22 – 2020-06-23 (×2): 10 mg via ORAL
  Filled 2020-06-22 (×2): qty 1

## 2020-06-22 MED ORDER — SODIUM CHLORIDE 0.9% FLUSH
3.0000 mL | INTRAVENOUS | Status: DC | PRN
  Administered 2020-06-22: 3 mL via INTRAVENOUS

## 2020-06-22 MED ORDER — PANTOPRAZOLE SODIUM 40 MG PO TBEC
40.0000 mg | DELAYED_RELEASE_TABLET | Freq: Every day | ORAL | Status: DC
  Administered 2020-06-23: 40 mg via ORAL
  Filled 2020-06-22: qty 1

## 2020-06-22 MED ORDER — METOPROLOL SUCCINATE ER 50 MG PO TB24
150.0000 mg | ORAL_TABLET | Freq: Every day | ORAL | Status: DC
  Administered 2020-06-23: 150 mg via ORAL
  Filled 2020-06-22: qty 1

## 2020-06-22 MED ORDER — HEPARIN SODIUM (PORCINE) 1000 UNIT/ML IJ SOLN
INTRAMUSCULAR | Status: DC | PRN
  Administered 2020-06-22: 10000 [IU] via INTRAVENOUS
  Administered 2020-06-22: 3000 [IU] via INTRAVENOUS

## 2020-06-22 MED ORDER — SODIUM CHLORIDE 0.9% FLUSH: 3.0000 mL | Freq: Two times a day (BID) | INTRAVENOUS | Status: DC

## 2020-06-22 MED ORDER — LABETALOL HCL 5 MG/ML IV SOLN: 10.0000 mg | INTRAVENOUS | Status: AC | PRN

## 2020-06-22 MED ORDER — NITROGLYCERIN 1 MG/10 ML FOR IR/CATH LAB
INTRA_ARTERIAL | Status: DC | PRN
  Administered 2020-06-22 (×2): 200 ug via INTRACORONARY

## 2020-06-22 MED ORDER — SODIUM CHLORIDE 0.9 % WEIGHT BASED INFUSION: 3.0000 mL/kg/h | INTRAVENOUS | Status: DC

## 2020-06-22 MED ORDER — HEPARIN SODIUM (PORCINE) 1000 UNIT/ML IJ SOLN
INTRAMUSCULAR | Status: AC
  Filled 2020-06-22: qty 1

## 2020-06-22 MED ORDER — CLOPIDOGREL BISULFATE 75 MG PO TABS
75.0000 mg | ORAL_TABLET | Freq: Every day | ORAL | Status: DC
  Administered 2020-06-23: 75 mg via ORAL
  Filled 2020-06-22: qty 1

## 2020-06-22 MED ORDER — HEPARIN (PORCINE) IN NACL 1000-0.9 UT/500ML-% IV SOLN
INTRAVENOUS | Status: DC | PRN
  Administered 2020-06-22 (×2): 500 mL

## 2020-06-22 MED ORDER — IOHEXOL 350 MG/ML SOLN
INTRAVENOUS | Status: DC | PRN
  Administered 2020-06-22: 95 mL

## 2020-06-22 MED ORDER — MIDAZOLAM HCL 2 MG/2ML IJ SOLN
INTRAMUSCULAR | Status: DC | PRN
  Administered 2020-06-22 (×3): 1 mg via INTRAVENOUS

## 2020-06-22 MED ORDER — SODIUM CHLORIDE 0.9 % WEIGHT BASED INFUSION
1.0000 mL/kg/h | INTRAVENOUS | Status: DC
  Administered 2020-06-22: 1 mL/kg/h via INTRAVENOUS

## 2020-06-22 MED ORDER — SODIUM CHLORIDE 0.9% FLUSH: 3.0000 mL | INTRAVENOUS | Status: DC | PRN

## 2020-06-22 SURGICAL SUPPLY — 21 items
BALLN  ~~LOC~~ SAPPHIRE 4.5X12 (BALLOONS) ×2
BALLN ~~LOC~~ SAPPHIRE 4.5X12 (BALLOONS) ×1
BALLOON ~~LOC~~ SAPPHIRE 4.5X12 (BALLOONS) IMPLANT
CATH OPTICROSS HD (CATHETERS) ×1 IMPLANT
CATH SHOCKWAVE 4.0X12 (CATHETERS) IMPLANT
CATH VISTA GUIDE 7FRXB LAD 3.5 (CATHETERS) ×1 IMPLANT
CATHETER SHOCKWAVE 4.0X12 (CATHETERS) ×2
COVER SWIFTLINK CONNECTOR (BAG) ×1 IMPLANT
KIT ENCORE 26 ADVANTAGE (KITS) ×1 IMPLANT
KIT ESSENTIALS PG (KITS) ×1 IMPLANT
KIT HEART LEFT (KITS) ×2 IMPLANT
PACK CARDIAC CATHETERIZATION (CUSTOM PROCEDURE TRAY) ×2 IMPLANT
SHEATH PINNACLE 7F 10CM (SHEATH) ×2 IMPLANT
SHEATH PROBE COVER 6X72 (BAG) ×1 IMPLANT
SLED PULL BACK IVUS (MISCELLANEOUS) ×1 IMPLANT
STENT SYNERGY XD 4.0X16 (Permanent Stent) IMPLANT
SYNERGY XD 4.0X16 (Permanent Stent) ×2 IMPLANT
TRANSDUCER W/STOPCOCK (MISCELLANEOUS) ×2 IMPLANT
TUBING CIL FLEX 10 FLL-RA (TUBING) ×2 IMPLANT
WIRE ASAHI PROWATER 180CM (WIRE) ×1 IMPLANT
WIRE EMERALD 3MM-J .035X150CM (WIRE) ×1 IMPLANT

## 2020-06-22 NOTE — Interval H&P Note (Signed)
History and Physical Interval Note:  06/22/2020 8:53 AM  Darleen Crocker  has presented today for surgery, with the diagnosis of cad with Angina.  The various methods of treatment have been discussed with the patient and family. After consideration of risks, benefits and other options for treatment, the patient has consented to  Procedure(s): CORONARY STENT INTERVENTION (N/A) as a surgical intervention.  The patient's history has been reviewed, patient examined, no change in status, stable for surgery.  I have reviewed the patient's chart and labs.  Questions were answered to the patient's satisfaction.    Cath Lab Visit (complete for each Cath Lab visit)  Clinical Evaluation Leading to the Procedure:   ACS: No.  Non-ACS:    Anginal Classification: CCS III  Anti-ischemic medical therapy: Minimal Therapy (1 class of medications)  Non-Invasive Test Results: No non-invasive testing performed  Prior CABG: Previous CABG   Glenetta Hew

## 2020-06-22 NOTE — Progress Notes (Signed)
Site area: rt groin fa sheath Site Prior to Removal:  Level 0 Pressure Applied For: 25 minutes Manual:   yes Patient Status During Pull:  stable Post Pull Site:  Level 0 Post Pull Instructions Given:  yes Post Pull Pulses Present: rt pt 1+ palpable Dressing Applied:  Gauze and tegaderm Bedrest begins @ 1310 Comments:

## 2020-06-23 DIAGNOSIS — I5032 Chronic diastolic (congestive) heart failure: Secondary | ICD-10-CM | POA: Diagnosis not present

## 2020-06-23 DIAGNOSIS — I110 Hypertensive heart disease with heart failure: Secondary | ICD-10-CM | POA: Diagnosis not present

## 2020-06-23 DIAGNOSIS — E782 Mixed hyperlipidemia: Secondary | ICD-10-CM | POA: Diagnosis not present

## 2020-06-23 DIAGNOSIS — I25119 Atherosclerotic heart disease of native coronary artery with unspecified angina pectoris: Secondary | ICD-10-CM | POA: Diagnosis not present

## 2020-06-23 LAB — BASIC METABOLIC PANEL
Anion gap: 9 (ref 5–15)
BUN: 11 mg/dL (ref 6–20)
CO2: 25 mmol/L (ref 22–32)
Calcium: 9.1 mg/dL (ref 8.9–10.3)
Chloride: 106 mmol/L (ref 98–111)
Creatinine, Ser: 0.78 mg/dL (ref 0.44–1.00)
GFR calc Af Amer: >60 mL/min (ref >60)
GFR calc non Af Amer: >60 mL/min (ref >60)
Glucose, Bld: 181 mg/dL — ABNORMAL HIGH (ref 70–99)
Potassium: 3.8 mmol/L (ref 3.5–5.1)
Sodium: 140 mmol/L (ref 135–145)

## 2020-06-23 LAB — CBC
HCT: 29.7 % — ABNORMAL LOW (ref 36.0–46.0)
Hemoglobin: 9.5 g/dL — ABNORMAL LOW (ref 12.0–15.0)
MCH: 27.7 pg (ref 26.0–34.0)
MCHC: 32 g/dL (ref 30.0–36.0)
MCV: 86.6 fL (ref 80.0–100.0)
Platelets: 213 10*3/uL (ref 150–400)
RBC: 3.43 MIL/uL — ABNORMAL LOW (ref 3.87–5.11)
RDW: 14.2 % (ref 11.5–15.5)
WBC: 8.8 10*3/uL (ref 4.0–10.5)
nRBC: 0 % (ref 0.0–0.2)

## 2020-06-23 LAB — GLUCOSE, CAPILLARY: Glucose-Capillary: 195 mg/dL — ABNORMAL HIGH (ref 70–99)

## 2020-06-23 MED ORDER — ATORVASTATIN CALCIUM 40 MG PO TABS
40.0000 mg | ORAL_TABLET | Freq: Every day | ORAL | Status: DC
Start: 2020-06-23 — End: 2020-08-08

## 2020-06-23 NOTE — Discharge Summary (Addendum)
Discharge Summary    [name] MRN: [id number]; DOB: 05/17/1961  Admit date: 06/22/2020 Discharge date: 06/23/2020  Primary Care Provider: Ernestene Kiel, MD  Primary Cardiologist: Shirlee More, MD  Primary Electrophysiologist:  None   Discharge Diagnoses    Principal Problem:   Coronary artery disease involving native coronary artery of native heart with angina pectoris [facility]) Active Problems:   Chronic diastolic heart failure [facility])   Essential hypertension   Hyperlipidemia   Uncontrolled type 2 diabetes mellitus [facility])    Diagnostic Studies/Procedures    Cardiac Catheterization 06/22/2020:  Mid LAD lesion is 100% stenosed.  Ost LAD to Prox LAD lesion is 60% stenosed with 80% stenosed side branch in 1st Diag.  1st Diag lesion is 85% stenosed. Post intervention, there is a 100% residual stenosis.  Mid LM lesion is 85% stenosed.  A drug-eluting stent was successfully placed using a SYNERGY XD 4.0X16.  Post intervention, there is a 0% residual stenosis.   Summary:  Successful SHOCKWAVE INTRAVASCULAR LITHOTRIPSY based DES PCI of LEFT MAIN using Synergy DES 4.0 mm x 16 mm postdilated to 4.6 mm (based on INTRAVASCULAR ULTRASOUND measurement) -> loss of small caliber 1st Diag due to mid vessel wire dissection @ initial Graft insertion site (too small for PCI).  Unfortunately the small caliber first diagonal branch being the most straight shot vessel for wiring did have significant lesions.  There did appear to be a mid vessel dissection at the 85% lesion.  After IC nitroglycerin, TIMI I-II flow was restored.  With the concern for dissection, I chose not to try to rewire of 1.5 to 2 mm vessel.  Diagnostic Dominance: Right  Intervention      _____________   History of Present Illness     Jasmine Richardson is a 59 y.o. female with a history of CAD with prior stenting to RCA in 2009, CABG x4 (LIMA-LAD, SVG-OM1, SVG-Diag, SVG-PDA) in 3474 complicated by  mediastinitis requiring reoperation, and more recently PCI/DES to in-stent restenosis of RCA on 05/25/2020 in setting of NTEMI. Also has history of chronic diastolic CHF, hypertension, hyperlipidemia, and uncontrolled type 2 diabetes with diabetic foot ulcer ultimately requiring right metatarsophaglangeal amputation in 02/2020 who was admitted last month with NSTEMI. Cardiac catheterization on 05/23/2020 showed severe native CAD including 80-90% stenosis of mid left main, occluded mid LAD, diffusely diseased LCX with occlusion of OM1, and 80% in-stent restenosis of the mid/distal RCA followed by 70% distal RCA and rPL lesions as well as occluded ostial rPDA. All SVG grafts were occluded but LIMA to LAD was patent. Initial plan was for staged PCI of RCA and left main. Patient was brought back to the cath lab on 05/25/2020 and underwent a very difficult but ultimately successful angioplasty with DES to the mid/distal RCA. Given difficulty of procedure, decision was made hold off on left main PCI and do this in outpatient setting. Echo during this admission showed LVEF of 50-55% with moderate dyskinesis of the mid-apical inferoseptal wall and grade 1 diastolic dysfunction. Patient seen by Dr. [name] for follow-up on 06/07/2020 at which time patient was doing better and denied any recurrent chest pain. Dr. [name] discussed case with Dr. Ellyn Hack and decision made to proceed with outpatient cardiac catheterization for staged PCI of left main disease.   Hospital Course     Consultants: None.  Patient presented to [facility] on 06/22/2020 for planned coronary intervention of left main as stated above. She underwent successful shockwave intravascular lithotripsy and DES of left  main. There was loss of a small caliber 1st Diag due to mid vessel wire dissection at the initial graft insertion site. This was too small for PCI. After IC Nitro, TIMI I-II flow was restored. Patient tolerated the procedure well. She feels well this  morning. She notes some mild chest soreness. She states it feels like the aftermath of getting punched in the chest. She states she also felt this way for a few days after her last PCI. No angina. Right femoral cath site stable with no signs of hematoma. Renal function stable. She will be seen by Cardiac Rehab prior to discharge. Continue DAPT with Aspirin 81mg  daily and Plavix 75mg  daily. Continue Toprol-XL 150mg  daily. Patient has been intolerant to high-intensity statins in the past but was retried on Lipitor during last hospitalization and tolerated this well. Lipitor no longer on med list but patient states she thinks Dr. [name] decreased it to 40mg  daily at last visit. Dr. Joya Gaskins note does mention continuing statin so will update med list with this. Continue Vascepa as well. May need to consider PCSK9 inhibitor as outpatient. Continue all other home medications. Can restart Metformin 48 hours after cardiac catheterization.   Of note, looks like patient has chronic anemia. Baseline hemoglobin 9.5 to 10.4 over the last several months. Hemoglobin stable at 9.5 today. Recommended following up with PCP.  Patient seen and evaluated by Dr. Marlou Porch today and determined to be stable for discharge. Outpatient follow-up arranged. Medications as below.   Did the patient have an acute coronary syndrome (MI, NSTEMI, STEMI, etc) this admission?:  No                               Did the patient have a percutaneous coronary intervention (stent / angioplasty)?:  Yes.     Cath/PCI Registry Performance & Quality Measures: 1. Aspirin prescribed? - Yes 2. ADP Receptor Inhibitor (Plavix/Clopidogrel, Brilinta/Ticagrelor or Effient/Prasugrel) prescribed (includes medically managed patients)? - Yes 3. High Intensity Statin (Lipitor 40-80mg  or Crestor 20-40mg ) prescribed? - Yes 4. For EF <40%, was ACEI/ARB prescribed? - Not Applicable (EF >/= 41%) 5. For EF <40%, Aldosterone Antagonist (Spironolactone or Eplerenone)  prescribed? - Not Applicable (EF >/= 93%) 6. Cardiac Rehab Phase II ordered? - Yes   _____________  Discharge Vitals Blood pressure (!) 146/77, pulse 84, temperature 98.4 F (36.9 C), temperature source Oral, resp. rate 16, height 5\' 7"  (1.702 m), weight 102.7 kg, SpO2 96 %.  Filed Weights   06/22/20 0648 06/23/20 0514  Weight: 100.7 kg 102.7 kg   General: 59 y.o. female resting comfortably in no acute distress.  HEENT: Normocephalic and atraumatic. Sclera clear.  Neck: Supple. No JVD. Heart: RRR. Distinct S1 and S2. No murmurs, gallops, or rubs. Radial pulses 2+ and equal bilaterally. Right femoral cath site soft with no signs of hematoma. Lungs: No increased work of breathing. Clear to ausculation bilaterally. No wheezes, rhonchi, or rales.  Abdomen: Soft, non-distended, and non-tender to palpation.  Extremities: No lower extremity edema.    Skin: Warm and dry. Neuro: Alert and oriented x3. No focal deficits. Psych: Normal affect. Responds appropriately.  Labs & Radiologic Studies    CBC Recent Labs    06/23/20 0424  WBC 8.8  HGB 9.5*  HCT 29.7*  MCV 86.6  PLT 790   Basic Metabolic Panel Recent Labs    06/23/20 0424  NA 140  K 3.8  CL 106  CO2 25  GLUCOSE 181*  BUN 11  CREATININE 0.78  CALCIUM 9.1   Liver Function Tests No results for input(s): AST, ALT, ALKPHOS, BILITOT, PROT, ALBUMIN in the last 72 hours. No results for input(s): LIPASE, AMYLASE in the last 72 hours. High Sensitivity Troponin:   No results for input(s): TROPONINIHS in the last 720 hours.  BNP Invalid input(s): POCBNP D-Dimer No results for input(s): DDIMER in the last 72 hours. Hemoglobin A1C No results for input(s): HGBA1C in the last 72 hours. Fasting Lipid Panel No results for input(s): CHOL, HDL, LDLCALC, TRIG, CHOLHDL, LDLDIRECT in the last 72 hours. Thyroid Function Tests No results for input(s): TSH, T4TOTAL, T3FREE, THYROIDAB in the last 72 hours.  Invalid input(s):  FREET3 _____________  CARDIAC CATHETERIZATION  Result Date: 06/22/2020  Mid LAD lesion is 100% stenosed.  Ost LAD to Prox LAD lesion is 60% stenosed with 80% stenosed side branch in 1st Diag.  1st Diag lesion is 85% stenosed. Post intervention, there is a 100% residual stenosis.  Mid LM lesion is 85% stenosed.  A drug-eluting stent was successfully placed using a SYNERGY XD 4.0X16.  Post intervention, there is a 0% residual stenosis.  SUMMARY  Successful SHOCKWAVE INTRAVASCULAR LITHOTRIPSY based DES PCI of LEFT MAIN using Synergy DES 4.0 mm x 16 mm postdilated to 4.6 mm (based on INTRAVASCULAR ULTRASOUND measurement) -> loss of small caliber 1st Diag due to mid vessel wire dissection @ initial Graft insertion site (too small for PCI).  Unfortunately the small caliber first diagonal branch being the most straight shot vessel for wiring did have significant lesions.  There did appear to be a mid vessel dissection at the 85% lesion.  After IC nitroglycerin, TIMI I-II flow was restored.  With the concern for dissection, I chose not to try to rewire of 1.5 to 2 mm vessel. Glenetta Hew, MD  CARDIAC CATHETERIZATION  Result Date: 05/25/2020  Prox RCA lesion is 30% stenosed.  Mid RCA to Dist RCA lesion is 90% stenosed.  Dist RCA lesion is 70% stenosed.  RPDA lesion is 100% stenosed.  RPAV lesion is 70% stenosed.  A drug-eluting stent was successfully placed using a [place] [id number].  Post intervention, there is a 0% residual stenosis.  Post intervention, there is a 0% residual stenosis.  Successful angioplasty and drug-eluting stent placement to the mid/distal right coronary artery for severe in-stent restenosis as well as significant disease distal to the stented segment. Transient acute closure of the vessel likely due to thrombus formation after predilation.  Significant difficulty advancing equipment beyond the lesion due to tortuosity, previously stented segment and calcifications.   Successful stent placement was performed with the use of telescope catheter. Recommendations: Dual antiplatelet therapy for at least 1 year and preferably longer. Aggressive treatment of risk factors. Left main PCI was planned but given difficulty with RCA PCI, recommend staging left main PCI to the outpatient setting.   DG Foot Complete Right  Result Date: 05/25/2020 Please see detailed radiograph report in office note.  Disposition   Patient is being discharged home today in good condition.  Follow-up Plans & Appointments     Follow-up Information    Richardo Priest, MD Follow up.   Specialty: Cardiology Why: Please keep follow-up appointment already scheduled for 07/19/2020 at 4:00pm with Dr. [name].  Contact information: [place] [id number] [phone]              Discharge Instructions    Amb Referral to Cardiac Rehabilitation  Complete by: As directed    Referring to [place] CRP 2   Diagnosis: Coronary Stents   After initial evaluation and assessments completed: Virtual Based Care may be provided alone or in conjunction with Phase 2 Cardiac Rehab based on patient barriers.: Yes   Diet - low sodium heart healthy   Complete by: As directed    Increase activity slowly   Complete by: As directed       Discharge Medications   Allergies as of 06/23/2020      Reactions   Tramadol    Hallucinations   Codeine Rash      Medication List    STOP taking these medications   promethazine 12.5 MG tablet Commonly known as: PHENERGAN   sulfamethoxazole-trimethoprim 400-80 MG tablet Commonly known as: BACTRIM     TAKE these medications   acetaminophen 500 MG tablet Commonly known as: TYLENOL Take 2 tablets (1,000 mg total) by mouth every 6 (six) hours as needed. What changed: reasons to take this   aspirin EC 81 MG tablet Take 1 tablet (81 mg total) by mouth daily.   atorvastatin 40 MG tablet Commonly known as: Lipitor Take 1 tablet (40 mg  total) by mouth daily.   Basaglar KwikPen 100 UNIT/ML Inject 0.2 mLs (20 Units total) into the skin at bedtime.   clopidogrel 75 MG tablet Commonly known as: PLAVIX Take 1 tablet (75 mg total) by mouth daily.   docusate sodium 100 MG capsule Commonly known as: Colace Take 1 capsule (100 mg total) by mouth daily as needed.   furosemide 40 MG tablet Commonly known as: LASIX Take 1 tablet by mouth once daily   glipiZIDE 10 MG tablet Commonly known as: GLUCOTROL Take 10 mg by mouth 2 (two) times daily.   icosapent Ethyl 1 g capsule Commonly known as: Vascepa Take 2 capsules (2 g total) by mouth 2 (two) times daily.   Januvia 100 MG tablet Generic drug: sitaGLIPtin Take 100 mg by mouth daily.   lisinopril 20 MG tablet Commonly known as: ZESTRIL Take 1 tablet (20 mg total) by mouth daily.   LORazepam 1 MG tablet Commonly known as: ATIVAN Take 1 mg by mouth 2 (two) times daily as needed for anxiety.   metFORMIN 500 MG 24 hr tablet Commonly known as: GLUCOPHAGE-XR Take 1,000 mg by mouth 2 (two) times daily. Notes to patient: First dose Saturday (06/25/2020) morning.   metoprolol succinate 50 MG 24 hr tablet Commonly known as: TOPROL-XL Take 150 mg by mouth daily.   nitroGLYCERIN 0.4 MG SL tablet Commonly known as: NITROSTAT Place 1 tablet (0.4 mg total) under the tongue every 5 (five) minutes x 3 doses as needed for chest pain.   pantoprazole 40 MG tablet Commonly known as: PROTONIX Take 1 tablet (40 mg total) by mouth daily.   VITAMIN B 12 PO Take 1 tablet by mouth daily.   vitamin C 250 MG tablet Commonly known as: ASCORBIC ACID Take 250 mg by mouth daily.   Vitamin D (Ergocalciferol) 1.25 MG (50000 UNIT) Caps capsule Commonly known as: DRISDOL Take 50,000 Units by mouth every 7 (seven) days. Saturdays          Outstanding Labs/Studies   N/A  Duration of Discharge Encounter   Greater than 30 minutes including physician time.  Signed, Darreld Mclean, PA-C 06/23/2020, 9:21 AM   Personally seen and examined. Agree with above.   Feels well currently.  Left main stenting.  Expressed the importance of dual antiplatelet  therapy.  Recent right toe amputation.  Awaiting orthotic shoes.  Physical therapy.  Lab work reviewed.  Catheterization site normal.  Leg warm.  Regular rate and rhythm with lungs clear.  Alert.  Cardiac catheterization personally reviewed.  Okay for discharge home.  See above for further details.  Close follow-up.  Candee Furbish, MD

## 2020-06-23 NOTE — Progress Notes (Signed)
CARDIAC REHAB PHASE I   PRE:  Rate/Rhythm: 83 SR  BP:  Supine:   Sitting: 152/91  Standing:    SaO2:  MODE:  Ambulation: 450 ft   POST:  Rate/Rhythm: 100 SR  BP:  Supine:   Sitting: 164/74  Standing:    SaO2: 96%RA 0943-1030 Pt walked 450 ft with Darco boot and steady gait. No CP. Tolerated well. Education completed with pt who voiced understanding. Stressed importance of plavix with stent. Reviewed NTG use, heart healthy and low carb food choices, walking for ex and CRP 2. Referring to [place] CRP 2. Pt stated she will be getting new shoe on July17 th.    Graylon Good, RN BSN  06/23/2020 10:25 AM

## 2020-06-23 NOTE — Plan of Care (Signed)
  Problem: Education: Goal: Understanding of CV disease, CV risk reduction, and recovery process will improve Outcome: Progressing   Problem: Activity: Goal: Ability to return to baseline activity level will improve Outcome: Completed/Met   Problem: Cardiovascular: Goal: Ability to achieve and maintain adequate cardiovascular perfusion will improve Outcome: Completed/Met Goal: Vascular access site(s) Level 0-1 will be maintained Outcome: Completed/Met

## 2020-06-23 NOTE — Discharge Instructions (Signed)
Post Cardiac Catheterization: NO HEAVY LIFTING OR SEXUAL ACTIVITY X 7 DAYS. NO DRIVING X 3-5 DAYS. NO SOAKING BATHS, HOT TUBS, POOLS, ETC., X 7 DAYS.  Groin Site Care Refer to this sheet in the next few weeks. These instructions provide you with information on caring for yourself after your procedure. Your caregiver may also give you more specific instructions. Your treatment has been planned according to current medical practices, but problems sometimes occur. Call your caregiver if you have any problems or questions after your procedure. HOME CARE INSTRUCTIONS You may shower 24 hours after the procedure. Remove the bandage (dressing) and gently wash the site with plain soap and water. Gently pat the site dry.  Do not apply powder or lotion to the site.  Do not sit in a bathtub, swimming pool, or whirlpool for 5 to 7 days.  No bending, squatting, or lifting anything over 10 pounds (4.5 kg) as directed by your caregiver.  Inspect the site at least twice daily.  Do not drive home if you are discharged the same day of the procedure. Have someone else drive you.  What to expect: Any bruising will usually fade within 1 to 2 weeks.  Blood that collects in the tissue (hematoma) may be painful to the touch. It should usually decrease in size and tenderness within 1 to 2 weeks.  SEEK IMMEDIATE MEDICAL CARE IF: You have unusual pain at the groin site or down the affected leg.  You have redness, warmth, swelling, or pain at the groin site.  You have drainage (other than a small amount of blood on the dressing).  You have chills.  You have a fever or persistent symptoms for more than 72 hours.  You have a fever and your symptoms suddenly get worse.  Your leg becomes pale, cool, tingly, or numb.  You have heavy bleeding from the site. Hold pressure on the site.   

## 2020-06-27 ENCOUNTER — Other Ambulatory Visit: Payer: Self-pay

## 2020-06-27 ENCOUNTER — Ambulatory Visit (INDEPENDENT_AMBULATORY_CARE_PROVIDER_SITE_OTHER): Payer: Commercial Managed Care - PPO | Admitting: Orthotics

## 2020-06-27 DIAGNOSIS — T8149XA Infection following a procedure, other surgical site, initial encounter: Secondary | ICD-10-CM

## 2020-06-27 DIAGNOSIS — T8131XD Disruption of external operation (surgical) wound, not elsewhere classified, subsequent encounter: Secondary | ICD-10-CM

## 2020-06-27 DIAGNOSIS — Z9889 Other specified postprocedural states: Secondary | ICD-10-CM | POA: Diagnosis not present

## 2020-06-27 DIAGNOSIS — Z89419 Acquired absence of unspecified great toe: Secondary | ICD-10-CM

## 2020-06-27 DIAGNOSIS — E1165 Type 2 diabetes mellitus with hyperglycemia: Secondary | ICD-10-CM

## 2020-06-27 NOTE — Progress Notes (Signed)
Cast today for L5000 hallux filler RT.

## 2020-06-28 ENCOUNTER — Encounter (HOSPITAL_COMMUNITY): Payer: Self-pay | Admitting: Cardiology

## 2020-06-29 ENCOUNTER — Encounter: Payer: Self-pay | Admitting: Sports Medicine

## 2020-06-29 ENCOUNTER — Other Ambulatory Visit: Payer: Self-pay

## 2020-06-29 ENCOUNTER — Ambulatory Visit (INDEPENDENT_AMBULATORY_CARE_PROVIDER_SITE_OTHER): Payer: Commercial Managed Care - PPO | Admitting: Sports Medicine

## 2020-06-29 DIAGNOSIS — Z9889 Other specified postprocedural states: Secondary | ICD-10-CM

## 2020-06-29 DIAGNOSIS — E1165 Type 2 diabetes mellitus with hyperglycemia: Secondary | ICD-10-CM

## 2020-06-29 DIAGNOSIS — Z89419 Acquired absence of unspecified great toe: Secondary | ICD-10-CM

## 2020-06-29 NOTE — Progress Notes (Signed)
Subjective: Jashae Wiggs is a 59 y.o. female patient seen today in office for POV # 7 (DOS 05/02/2020), S/P revision of amputation stump site right foot with removal of additional bone at the first metatarsal head and primary wound closure.  Patient reports that she had her heart procedure last week but is concerned now about hard skin that she got caught on the sock pointed off and saw a little bit of bloody drainage but since then has not seen any reaccumulation of drainage reports still issues with swelling and some redness but denies any pain.  Has still been using surgical shoe and protecting area sometimes with a Band-Aid no other issues noted.   Fasting blood sugar 260.    Patient Active Problem List   Diagnosis Date Noted  . Unstable angina ([place]) 05/22/2020  . NSTEMI (non-ST elevated myocardial infarction) ([place]) 05/22/2020  . Diabetic foot ulcer ([place]) 02/16/2020  . Angina pectoris ([place]) 09/11/2017  . Emphysema lung ([place]) 09/11/2017  . Chronic diastolic heart failure ([place]) 11/06/2016  . Mediastinitis 06/28/2016  . Wound, surgical, infected 06/06/2016  . S/P CABG (coronary artery bypass graft) 06/03/2016  . GERD (gastroesophageal reflux disease) 05/08/2016  . Morbid obesity ([place]) 05/08/2016  . Uncontrolled type 2 diabetes mellitus ([place]) 05/08/2016  . Tobacco use disorder 04/20/2016  . Coronary artery disease involving native coronary artery of native heart with angina pectoris ([place]) 09/12/2015  . Essential hypertension 09/12/2015  . Hyperlipidemia 09/12/2015    Current Outpatient Medications on File Prior to Visit  Medication Sig Dispense Refill  . acetaminophen (TYLENOL) 500 MG tablet Take 2 tablets (1,000 mg total) by mouth every 6 (six) hours as needed. (Patient taking differently: Take 1,000 mg by mouth every 6 (six) hours as needed for moderate pain. ) 100 tablet 2  . aspirin EC 81 MG tablet Take 1 tablet (81 mg total) by mouth daily. 90 tablet 3  . atorvastatin (LIPITOR) 40  MG tablet Take 1 tablet (40 mg total) by mouth daily.    . clopidogrel (PLAVIX) 75 MG tablet Take 1 tablet (75 mg total) by mouth daily. 30 tablet 10  . Cyanocobalamin (VITAMIN B 12 PO) Take 1 tablet by mouth daily.    [name] docusate sodium (COLACE) 100 MG capsule Take 1 capsule (100 mg total) by mouth daily as needed. (Patient not taking: Reported on 06/09/2020) 30 capsule 2  . furosemide (LASIX) 40 MG tablet Take 1 tablet by mouth once daily (Patient taking differently: Take 40 mg by mouth daily. ) 90 tablet 2  . glipiZIDE (GLUCOTROL) 10 MG tablet Take 10 mg by mouth 2 (two) times daily.    [name] icosapent Ethyl (VASCEPA) 1 g capsule Take 2 capsules (2 g total) by mouth 2 (two) times daily. 120 capsule 4  . Insulin Glargine (BASAGLAR KWIKPEN) 100 UNIT/ML Inject 0.2 mLs (20 Units total) into the skin at bedtime. 15 mL 0  . JANUVIA 100 MG tablet Take 100 mg by mouth daily.    [name] lisinopril (ZESTRIL) 20 MG tablet Take 1 tablet (20 mg total) by mouth daily. 60 tablet 3  . LORazepam (ATIVAN) 1 MG tablet Take 1 mg by mouth 2 (two) times daily as needed for anxiety.     . metFORMIN (GLUCOPHAGE-XR) 500 MG 24 hr tablet Take 1,000 mg by mouth 2 (two) times daily.    . metoprolol succinate (TOPROL-XL) 50 MG 24 hr tablet Take 150 mg by mouth daily.     . nitroGLYCERIN (NITROSTAT) 0.4 MG SL tablet  Place 1 tablet (0.4 mg total) under the tongue every 5 (five) minutes x 3 doses as needed for chest pain. 30 tablet 11  . pantoprazole (PROTONIX) 40 MG tablet Take 1 tablet (40 mg total) by mouth daily. 60 tablet 3  . vitamin C (ASCORBIC ACID) 250 MG tablet Take 250 mg by mouth daily.    . Vitamin D, Ergocalciferol, (DRISDOL) 50000 units CAPS capsule Take 50,000 Units by mouth every 7 (seven) days. Saturdays     No current facility-administered medications on file prior to visit.    Allergies  Allergen Reactions  . Tramadol     Hallucinations  . Codeine Rash    Objective: There were no vitals filed for this  visit.  General: No acute distress, AAOx3  Right foot: Incision well-healed at surgical site, with mild dry scabbing, mild swelling to the amputation stump site, blanchable erythema with no signs of cellulitis likely from swelling as previously educated patient on, no warmth, no other signs of infection noted, Capillary fill time <5 seconds in all lesser digits, protective sensation severely diminished.  No pain or crepitation with range of motion right foot.  No pain with calf compression.  Assessment and Plan:  Problem List Items Addressed This Visit      Endocrine   Uncontrolled type 2 diabetes mellitus ([place])    Other Visit Diagnoses    S/P foot surgery, right    -  Primary   History of amputation of hallux ([place])           -Patient seen and evaluated -Patient is awaiting custom molded insole -Advised patient to refrain from pulling off skin and to be careful in order to avoid reopening of incision however at this time incision is to be well-healed with no acute concerns at the areas of dry scab -Advised patient that swelling at amputation stump site is normal and blanchable erythema is also normal due to healing like before -Continue with postoperative shoe to prevent rubbing to amputation stump site and a clean sock to the area -Advised patient to elevate as necessary to assist with edema control and to limit activities to tolerance -Continue with Tylenol as needed for pain -Return to office in as scheduled for pickup of custom molded insole and then after a follow-up with me for surgical site and orthotic check.  Landis Martins, DPM

## 2020-07-01 ENCOUNTER — Other Ambulatory Visit: Payer: Self-pay | Admitting: Sports Medicine

## 2020-07-01 DIAGNOSIS — Z9889 Other specified postprocedural states: Secondary | ICD-10-CM

## 2020-07-07 ENCOUNTER — Telehealth (HOSPITAL_COMMUNITY): Payer: Self-pay

## 2020-07-07 NOTE — Telephone Encounter (Signed)
Faxed referral for Phase II cardiac rehab to [facility]. °

## 2020-07-18 ENCOUNTER — Encounter: Payer: Commercial Managed Care - PPO | Admitting: Orthotics

## 2020-07-19 ENCOUNTER — Ambulatory Visit: Payer: Commercial Managed Care - PPO | Admitting: Cardiology

## 2020-07-21 ENCOUNTER — Telehealth: Payer: Self-pay | Admitting: Cardiology

## 2020-07-21 NOTE — Telephone Encounter (Signed)
   Pt said they were just informed she and her family was exposed to covid. She is scared due to all of her heart issue, she doesn't know what to do and would like to ask Dr. Joya Gaskins recommendation. She said right now, she is feeling ok and not feeling any symptoms yet

## 2020-07-22 ENCOUNTER — Telehealth: Payer: Self-pay | Admitting: Cardiology

## 2020-07-22 NOTE — Telephone Encounter (Signed)
Follow Up  Patient is calling back in to follow up. Please give patient a call back.

## 2020-07-22 NOTE — Telephone Encounter (Signed)
Pt aware to watch for symptoms and if gets SOB to go to ED Per pt is going to get tested today the patient verbalizes understanding and will keep upcoming appt at this time ./cy

## 2020-07-22 NOTE — Telephone Encounter (Signed)
Patient stated she was returning a call from a few minutes ago. I informed her I did not see any notes of someone calling recently. Patient thanked me for the help.

## 2020-07-22 NOTE — Telephone Encounter (Signed)
She can schedule her follow-up visit as long as is after the 14 days..  I see that she is scheduled for August 08, 2020.

## 2020-07-25 ENCOUNTER — Other Ambulatory Visit: Payer: Self-pay

## 2020-07-25 ENCOUNTER — Encounter: Payer: Commercial Managed Care - PPO | Admitting: Orthotics

## 2020-08-07 NOTE — Progress Notes (Signed)
Cardiology Office Note:    Date:  08/08/2020   ID:  Jasmine Richardson, DOB [birth year], MRN [id number]  PCP:  Ernestene Kiel, MD  Cardiologist:  Shirlee More, MD    Referring MD: Ernestene Kiel, MD    ASSESSMENT:    1. Coronary artery disease involving native coronary artery of native heart with angina pectoris ([place])   2. Hypertensive heart disease with chronic diastolic congestive heart failure ([place])   3. Mixed hyperlipidemia   4. Diabetic ulcer of toe of right foot associated with diabetes mellitus of other type, with fat layer exposed ([place])    PLAN:    In order of problems listed above:  1. She has much improved following complex multivessel percutaneous intervention following non-ST elevation MI and will continue her current treatment including long-term dual antiplatelet therapy. 2. Stable BP at target no orthostatic shift heart failure compensated continue her current loop diuretic 3. Stable most recent profile lipids 05/22/2020 cholesterol 208 LDL 91 HDL 34 triglycerides 559 continue her current multidrug regimen including pravastatin and icosapent ethyl.  If statin intolerant would need PCSK9 inhibitor 4. Improved wound is healed   Next appointment: 6 months   Medication Adjustments/Labs and Tests Ordered: Current medicines are reviewed at length with the patient today.  Concerns regarding medicines are outlined above.  No orders of the defined types were placed in this encounter.  No orders of the defined types were placed in this encounter.   Chief Complaint  Patient presents with  . Follow-up  . Coronary Artery Disease    History of Present Illness:    Jasmine Richardson is a 59 y.o. female with a hx of CAD, S/P CABG 04/23/16 with post operative mediastinitis and reoperation at [facility] with a myocutaneous pectoral flaps for an infected sternotomy after CABG , diastolic heart failure, T2 DM, hypertension and hyperlipidemia    She was last seen 06/07/2020.     She had PCI and stent of the native right coronary artery after ACS 05/25/2020 with chronic occlusion of the saphenous vein graft to the right coronary artery.  She had subsequent complex PCI and stent left main coronary artery with shockwave intravascular lithotripsy 06/22/2020 with dissection and occlusion of a small caliber first diagonal branch.  Compliance with diet, lifestyle and medications: Yes  She feels improved she is having no chest pain edema palpitation or syncope.  Tolerates her dual antiplatelet without bleeding or bruising and lipid-lowering therapy without muscle pain or weakness.  EKG independently reviewed 06/23/2020 stable sinus rhythm right bundle branch block incomplete old anteroseptal MI old inferior MI no acute ischemic changes. Past Medical History:  Diagnosis Date  . Acute chest pain 05/08/2016  . Angina pectoris ([place]) 09/11/2017  . Burping 05/08/2016  . Chronic diastolic heart failure ([place]) 11/06/2016  . Coronary artery disease involving native coronary artery of native heart with angina pectoris ([place]) 09/12/2015   Overview:  PCI and stent of RCA 2009, last cath 2012 with medical therapy  CABG May 2017  . Emphysema lung ([place]) 09/11/2017  . Essential hypertension 09/12/2015  . GERD (gastroesophageal reflux disease) 05/08/2016  . Hyperlipidemia 09/12/2015  . Mediastinitis 06/28/2016  . Morbid obesity ([place]) 05/08/2016  . S/P CABG (coronary artery bypass graft) 06/03/2016   Overview:  The patient underwent sternal reconstruction on 06/21/16 with pec flaps for mediastinitis from a prior CABG in May 2017. On admission, she was critically ill from sepsis and had altered mental status. She was last seen in clinic on 08/09/16 at  which time she was doing well.  . Severe sepsis ([place]) 06/03/2016  . Sinus tachycardia 05/08/2016  . Tobacco use disorder 04/20/2016   Overview:  Quit in May 2017.  [name] Uncontrolled type 2 diabetes mellitus ([place]) 05/08/2016  . Wound, surgical, infected 06/06/2016    Overview:  sternal    Past Surgical History:  Procedure Laterality Date  . AMPUTATION TOE Right 02/20/2020   Procedure: AMPUTATION RIGHT GREAT  TOE;  Surgeon: Felipa Furnace, DPM;  Location: [place];  Service: Podiatry;  Laterality: Right;  . BLADDER SURGERY    . CARDIAC CATHETERIZATION    . CORONARY ARTERY BYPASS GRAFT    . CORONARY STENT INTERVENTION N/A 05/25/2020   Procedure: CORONARY STENT INTERVENTION;  Surgeon: Wellington Hampshire, MD;  Location: [facility];  Service: Cardiovascular;  Laterality: N/A;  . CORONARY STENT INTERVENTION N/A 06/22/2020   Procedure: CORONARY STENT INTERVENTION;  Surgeon: Leonie Man, MD;  Location: [facility];  Service: Cardiovascular;  Laterality: N/A;  . INCISION AND DRAINAGE Right 02/19/2020   Procedure: INCISION AND DRAINAGE;  Surgeon: Trula Slade, DPM;  Location: [place];  Service: Podiatry;  Laterality: Right;  Block done by surgeon  . INTRAVASCULAR ULTRASOUND/IVUS N/A 06/22/2020   Procedure: Intravascular Ultrasound/IVUS;  Surgeon: Leonie Man, MD;  Location: [facility];  Service: Cardiovascular;  Laterality: N/A;  . LEFT HEART CATH AND CORS/GRAFTS ANGIOGRAPHY N/A 05/23/2020   Procedure: LEFT HEART CATH AND CORS/GRAFTS ANGIOGRAPHY;  Surgeon: Nelva Bush, MD;  Location: [facility];  Service: Cardiovascular;  Laterality: N/A;  . METATARSAL HEAD EXCISION Right 05/02/2020   Procedure: FIRST METATARSAL HEAD RESECTION; RIGHT FOOT WOUND CLOSURE;  Surgeon: Landis Martins, DPM;  Location: [facility];  Service: Podiatry;  Laterality: Right;  MAC W/LOCAL  . TUBAL LIGATION      Current Medications: Current Meds  Medication Sig  . acetaminophen (TYLENOL) 500 MG tablet Take 2 tablets (1,000 mg total) by mouth every 6 (six) hours as needed. (Patient taking differently: Take 1,000 mg by mouth every 6 (six) hours as needed for moderate pain. )  . aspirin EC 81 MG tablet Take 1 tablet (81 mg total) by mouth daily.   . clopidogrel (PLAVIX) 75 MG tablet Take 1 tablet (75 mg total) by mouth daily.  . Cyanocobalamin (VITAMIN B 12 PO) Take 1 tablet by mouth daily.  [name] docusate sodium (COLACE) 100 MG capsule Take 1 capsule (100 mg total) by mouth daily as needed.  . furosemide (LASIX) 40 MG tablet Take 1 tablet by mouth once daily (Patient taking differently: Take 40 mg by mouth daily. )  . glipiZIDE (GLUCOTROL) 10 MG tablet Take 10 mg by mouth 2 (two) times daily.  [name] icosapent Ethyl (VASCEPA) 1 g capsule Take 2 capsules (2 g total) by mouth 2 (two) times daily.  . Insulin Glargine (BASAGLAR KWIKPEN) 100 UNIT/ML Inject 0.2 mLs (20 Units total) into the skin at bedtime.  [name] JANUVIA 100 MG tablet Take 100 mg by mouth daily.  [name] lisinopril (ZESTRIL) 20 MG tablet Take 1 tablet (20 mg total) by mouth daily.  [name] LORazepam (ATIVAN) 1 MG tablet Take 1 mg by mouth 2 (two) times daily as needed for anxiety.   . metFORMIN (GLUCOPHAGE-XR) 500 MG 24 hr tablet Take 1,000 mg by mouth 2 (two) times daily.  . metoprolol succinate (TOPROL-XL) 50 MG 24 hr tablet Take 150 mg by mouth daily.   . nitroGLYCERIN (NITROSTAT) 0.4 MG  SL tablet Place 1 tablet (0.4 mg total) under the tongue every 5 (five) minutes x 3 doses as needed for chest pain.  . pantoprazole (PROTONIX) 40 MG tablet Take 1 tablet (40 mg total) by mouth daily.  . pravastatin (PRAVACHOL) 20 MG tablet Take 20 mg by mouth daily.  . vitamin C (ASCORBIC ACID) 250 MG tablet Take 250 mg by mouth daily.  . Vitamin D, Ergocalciferol, (DRISDOL) 50000 units CAPS capsule Take 50,000 Units by mouth every 7 (seven) days. Saturdays     Allergies:   Tramadol and Codeine   Social History   Socioeconomic History  . Marital status: Married    Spouse name: Not on file  . Number of children: Not on file  . Years of education: Not on file  . Highest education level: Not on file  Occupational History  . Not on file  Tobacco Use  . Smoking status: Former Smoker    Quit date: 05/2016     Years since quitting: 4.2  . Smokeless tobacco: Never Used  Vaping Use  . Vaping Use: Never used  Substance and Sexual Activity  . Alcohol use: No  . Drug use: No  . Sexual activity: Yes  Other Topics Concern  . Not on file  Social History Narrative  . Not on file   Social Determinants of Health   Financial Resource Strain:   . Difficulty of Paying Living Expenses: Not on file  Food Insecurity:   . Worried About Charity fundraiser in the Last Year: Not on file  . Ran Out of Food in the Last Year: Not on file  Transportation Needs:   . Lack of Transportation (Medical): Not on file  . Lack of Transportation (Non-Medical): Not on file  Physical Activity:   . Days of Exercise per Week: Not on file  . Minutes of Exercise per Session: Not on file  Stress:   . Feeling of Stress : Not on file  Social Connections:   . Frequency of Communication with Friends and Family: Not on file  . Frequency of Social Gatherings with Friends and Family: Not on file  . Attends Religious Services: Not on file  . Active Member of Clubs or Organizations: Not on file  . Attends Archivist Meetings: Not on file  . Marital Status: Not on file     Family History: The patient's family history includes Alzheimer's disease in her father; Heart attack in her father; Heart disease in her father; Hyperlipidemia in her brother and mother; Hypertension in her brother, father, and mother. ROS:   Please see the history of present illness.    All other systems reviewed and are negative.  EKGs/Labs/Other Studies Reviewed:    The following studies were reviewed today:    Recent Labs: 02/16/2020: ALT 11 05/22/2020: Magnesium 1.0; TSH 0.961 06/23/2020: BUN 11; Creatinine, Ser 0.78; Hemoglobin 9.5; Platelets 213; Potassium 3.8; Sodium 140  Recent Lipid Panel    Component Value Date/Time   CHOL 208 (H) 05/22/2020 2122   TRIG 559 (H) 05/22/2020 2122   HDL 37 (L) 05/22/2020 2122   CHOLHDL 5.6  05/22/2020 2122   VLDL UNABLE TO CALCULATE IF TRIGLYCERIDE OVER 400 mg/dL 05/22/2020 2122   LDLCALC UNABLE TO CALCULATE IF TRIGLYCERIDE OVER 400 mg/dL 05/22/2020 2122   LDLDIRECT 91.4 05/22/2020 2122    Physical Exam:    VS:  BP 120/79   Pulse 86   Ht _0  (1.626 m)   Wt 233 lb (  105.7 kg)   LMP  (LMP Unknown)   SpO2 95%   BMI 39.99 kg/m     Wt Readings from Last 3 Encounters:  08/08/20 233 lb (105.7 kg)  06/23/20 226 lb 6.4 oz (102.7 kg)  06/07/20 223 lb (101.2 kg)     GEN:  Well nourished, well developed in no acute distress HEENT: Normal NECK: No JVD; No carotid bruits LYMPHATICS: No lymphadenopathy CARDIAC: RRR, no murmurs, rubs, gallops RESPIRATORY:  Clear to auscultation without rales, wheezing or rhonchi  ABDOMEN: Soft, non-tender, non-distended MUSCULOSKELETAL:  No edema; No deformity  SKIN: Warm and dry NEUROLOGIC:  Alert and oriented x 3 PSYCHIATRIC:  Normal affect    Signed, Shirlee More, MD  08/08/2020 4:32 PM    [facility]

## 2020-08-08 ENCOUNTER — Other Ambulatory Visit: Payer: Self-pay

## 2020-08-08 ENCOUNTER — Encounter: Payer: Self-pay | Admitting: Cardiology

## 2020-08-08 ENCOUNTER — Ambulatory Visit: Payer: Commercial Managed Care - PPO | Admitting: Cardiology

## 2020-08-08 VITALS — BP 120/79 | HR 86 | Ht 64.0 in | Wt 233.0 lb

## 2020-08-08 DIAGNOSIS — L97512 Non-pressure chronic ulcer of other part of right foot with fat layer exposed: Secondary | ICD-10-CM

## 2020-08-08 DIAGNOSIS — I5032 Chronic diastolic (congestive) heart failure: Secondary | ICD-10-CM

## 2020-08-08 DIAGNOSIS — E782 Mixed hyperlipidemia: Secondary | ICD-10-CM

## 2020-08-08 DIAGNOSIS — E13621 Other specified diabetes mellitus with foot ulcer: Secondary | ICD-10-CM | POA: Diagnosis not present

## 2020-08-08 DIAGNOSIS — I25119 Atherosclerotic heart disease of native coronary artery with unspecified angina pectoris: Secondary | ICD-10-CM

## 2020-08-08 DIAGNOSIS — I110 Hypertensive heart disease with heart failure: Secondary | ICD-10-CM | POA: Diagnosis not present

## 2020-08-08 NOTE — Patient Instructions (Signed)

## 2020-08-10 ENCOUNTER — Other Ambulatory Visit: Payer: Self-pay

## 2020-08-10 ENCOUNTER — Encounter: Payer: Self-pay | Admitting: Sports Medicine

## 2020-08-10 ENCOUNTER — Ambulatory Visit: Payer: Commercial Managed Care - PPO | Admitting: Sports Medicine

## 2020-08-10 DIAGNOSIS — Z89419 Acquired absence of unspecified great toe: Secondary | ICD-10-CM

## 2020-08-10 DIAGNOSIS — E1165 Type 2 diabetes mellitus with hyperglycemia: Secondary | ICD-10-CM

## 2020-08-10 DIAGNOSIS — Z9889 Other specified postprocedural states: Secondary | ICD-10-CM | POA: Diagnosis not present

## 2020-08-10 DIAGNOSIS — B359 Dermatophytosis, unspecified: Secondary | ICD-10-CM | POA: Diagnosis not present

## 2020-08-10 MED ORDER — GABAPENTIN 300 MG PO CAPS: 300.0000 mg | ORAL_CAPSULE | Freq: Every day | ORAL | 3 refills | Status: DC

## 2020-08-10 MED ORDER — MICONAZOLE NITRATE 2 % EX AERO: INHALATION_SPRAY | CUTANEOUS | 2 refills | Status: DC

## 2020-08-10 NOTE — Progress Notes (Signed)
Subjective: Jasmine Richardson is a 59 y.o. female patient seen today in office for POV # 8 (DOS 05/02/2020), S/P revision of amputation stump site right foot with removal of additional bone at the first metatarsal head and primary wound closure.  Patient reports that she had some swelling and some redness to the amputation stump on the right reports that since she got her new insoles her feet for a little swollen and she has worse pain of numbness and tingling at night reports that she had a piece of dry skin that came off on yesterday and has noticed some dry scaling at the bottom of her left foot that she is concerned about.  Patient denies any redness warmth drainage to the left foot.    Fasting blood sugar 234.    Patient Active Problem List   Diagnosis Date Noted  . Unstable angina ([place]) 05/22/2020  . NSTEMI (non-ST elevated myocardial infarction) ([place]) 05/22/2020  . Diabetic foot ulcer ([place]) 02/16/2020  . Angina pectoris ([place]) 09/11/2017  . Emphysema lung ([place]) 09/11/2017  . Chronic diastolic heart failure ([place]) 11/06/2016  . Mediastinitis 06/28/2016  . Wound, surgical, infected 06/06/2016  . S/P CABG (coronary artery bypass graft) 06/03/2016  . Severe sepsis ([place]) 06/03/2016  . GERD (gastroesophageal reflux disease) 05/08/2016  . Morbid obesity ([place]) 05/08/2016  . Uncontrolled type 2 diabetes mellitus ([place]) 05/08/2016  . Sinus tachycardia 05/08/2016  . Acute chest pain 05/08/2016  . Burping 05/08/2016  . Tobacco use disorder 04/20/2016  . Coronary artery disease involving native coronary artery of native heart with angina pectoris ([place]) 09/12/2015  . Essential hypertension 09/12/2015  . Hyperlipidemia 09/12/2015    Current Outpatient Medications on File Prior to Visit  Medication Sig Dispense Refill  . acetaminophen (TYLENOL) 500 MG tablet Take 2 tablets (1,000 mg total) by mouth every 6 (six) hours as needed. (Patient taking differently: Take 1,000 mg by mouth every 6 (six) hours  as needed for moderate pain. ) 100 tablet 2  . aspirin EC 81 MG tablet Take 1 tablet (81 mg total) by mouth daily. 90 tablet 3  . clopidogrel (PLAVIX) 75 MG tablet Take 1 tablet (75 mg total) by mouth daily. 30 tablet 10  . Cyanocobalamin (VITAMIN B 12 PO) Take 1 tablet by mouth daily.    [name] docusate sodium (COLACE) 100 MG capsule Take 1 capsule (100 mg total) by mouth daily as needed. 30 capsule 2  . furosemide (LASIX) 40 MG tablet Take 1 tablet by mouth once daily (Patient taking differently: Take 40 mg by mouth daily. ) 90 tablet 2  . glipiZIDE (GLUCOTROL) 10 MG tablet Take 10 mg by mouth 2 (two) times daily.    [name] icosapent Ethyl (VASCEPA) 1 g capsule Take 2 capsules (2 g total) by mouth 2 (two) times daily. 120 capsule 4  . Insulin Glargine (BASAGLAR KWIKPEN) 100 UNIT/ML Inject 0.2 mLs (20 Units total) into the skin at bedtime. 15 mL 0  . JANUVIA 100 MG tablet Take 100 mg by mouth daily.    [name] lisinopril (ZESTRIL) 20 MG tablet Take 1 tablet (20 mg total) by mouth daily. 60 tablet 3  . LORazepam (ATIVAN) 1 MG tablet Take 1 mg by mouth 2 (two) times daily as needed for anxiety.     . metFORMIN (GLUCOPHAGE-XR) 500 MG 24 hr tablet Take 1,000 mg by mouth 2 (two) times daily.    . metoprolol succinate (TOPROL-XL) 50 MG 24 hr tablet Take 150 mg by mouth daily.     [name]  nitroGLYCERIN (NITROSTAT) 0.4 MG SL tablet Place 1 tablet (0.4 mg total) under the tongue every 5 (five) minutes x 3 doses as needed for chest pain. 30 tablet 11  . pantoprazole (PROTONIX) 40 MG tablet Take 1 tablet (40 mg total) by mouth daily. 60 tablet 3  . pravastatin (PRAVACHOL) 20 MG tablet Take 20 mg by mouth daily.    . vitamin C (ASCORBIC ACID) 250 MG tablet Take 250 mg by mouth daily.    . Vitamin D, Ergocalciferol, (DRISDOL) 50000 units CAPS capsule Take 50,000 Units by mouth every 7 (seven) days. Saturdays     No current facility-administered medications on file prior to visit.    Allergies  Allergen Reactions  . Tramadol      Hallucinations  . Codeine Rash    Objective: There were no vitals filed for this visit.  General: No acute distress, AAOx3  Right foot: Incision well-healed at surgical site, with mild dry scabbing, mild swelling to the amputation stump site, blanchable erythema consistent with normal flap vascular return, no warmth, no other signs of infection noted, Capillary fill time <5 seconds in all lesser digits, protective sensation severely diminished.  No pain or crepitation with range of motion right foot.  Subjective burning to the foot likely nerve related.  No pain with calf compression.  Left foot: Dry scaly skin noted to the plantar aspect of the foot and in between the toes as well as the distal tuft of the left hallux likely consistent with tinea pruritic in nature with no active drainage no significant malodor no significant redness or warmth.  Assessment and Plan:  Problem List Items Addressed This Visit      Endocrine   Uncontrolled type 2 diabetes mellitus ([place])    Other Visit Diagnoses    S/P foot surgery, right    -  Primary   History of amputation of hallux ([facility])       Tinea       Relevant Medications   Miconazole Nitrate 2 % AERO     -Patient seen and evaluated -Scab check performed at right surgical site with no significant signs of infection however to the scab since I used a 15 blade to lift the scab I applied a small amount of antibiotic cream and Band-Aid and advised patient to do the same over the next week and to protect area with a Band-Aid when in shower otherwise may remove Band-Aid at bedtime -Prescribed gabapentin for patient to take at bedtime for nerve symptoms and advised her to take a break from her socks to help prevent against worsening tinea of which I also prescribed Lotrimin spray for patient to use as instructed -Advised patient to continue with custom molded insoles and if she is noticing some swelling or discomfort with her current shoes may remove  the insoles and try the other tennis shoes however at this time her current insoles appear to be fitting well -Return to office in as scheduled in 1 month or sooner problems or issues arise.  Landis Martins, DPM

## 2020-08-30 ENCOUNTER — Other Ambulatory Visit: Payer: Self-pay | Admitting: Cardiology

## 2020-08-30 MED ORDER — LISINOPRIL 20 MG PO TABS
20.0000 mg | ORAL_TABLET | Freq: Every day | ORAL | 3 refills | Status: DC
Start: 2020-08-30 — End: 2021-01-11

## 2020-08-30 NOTE — Telephone Encounter (Signed)
*  STAT* If patient is at the pharmacy, call can be transferred to refill team.   1. Which medications need to be refilled? (please list name of each medication and dose if known)  lisinopril (ZESTRIL) 20 MG tablet pantoprazole (PROTONIX) 40 MG tablet  2. Which pharmacy/location (including street and city if local pharmacy) is medication to be sent to? [place], [place]  3. Do they need a 30 day or 90 day supply? 90 day

## 2020-08-30 NOTE — Telephone Encounter (Signed)
Refill sent in per request.  

## 2020-09-07 ENCOUNTER — Other Ambulatory Visit: Payer: Self-pay | Admitting: Cardiology

## 2020-09-07 ENCOUNTER — Ambulatory Visit (INDEPENDENT_AMBULATORY_CARE_PROVIDER_SITE_OTHER): Payer: Commercial Managed Care - PPO | Admitting: Sports Medicine

## 2020-09-07 ENCOUNTER — Other Ambulatory Visit: Payer: Self-pay

## 2020-09-07 ENCOUNTER — Encounter: Payer: Self-pay | Admitting: Sports Medicine

## 2020-09-07 DIAGNOSIS — E1165 Type 2 diabetes mellitus with hyperglycemia: Secondary | ICD-10-CM

## 2020-09-07 DIAGNOSIS — Z9889 Other specified postprocedural states: Secondary | ICD-10-CM

## 2020-09-07 DIAGNOSIS — B359 Dermatophytosis, unspecified: Secondary | ICD-10-CM

## 2020-09-07 DIAGNOSIS — Z89419 Acquired absence of unspecified great toe: Secondary | ICD-10-CM | POA: Diagnosis not present

## 2020-09-07 NOTE — Progress Notes (Signed)
Subjective: Jasmine Richardson is a 59 y.o. female patient seen today in office for POV # 9 (DOS 05/02/2020), S/P revision of amputation stump site right foot with removal of additional bone at the first metatarsal head and primary wound closure.  Patient reports that she is doing better and will get new shoes that can fit her insole but was surprised to know that she had a $6k bill.    Patient Active Problem List   Diagnosis Date Noted  . Unstable angina ([place]) 05/22/2020  . NSTEMI (non-ST elevated myocardial infarction) ([place]) 05/22/2020  . Diabetic foot ulcer ([place]) 02/16/2020  . Angina pectoris ([place]) 09/11/2017  . Emphysema lung ([place]) 09/11/2017  . Chronic diastolic heart failure ([place]) 11/06/2016  . Mediastinitis 06/28/2016  . Wound, surgical, infected 06/06/2016  . S/P CABG (coronary artery bypass graft) 06/03/2016  . Severe sepsis ([place]) 06/03/2016  . GERD (gastroesophageal reflux disease) 05/08/2016  . Morbid obesity ([place]) 05/08/2016  . Uncontrolled type 2 diabetes mellitus ([place]) 05/08/2016  . Sinus tachycardia 05/08/2016  . Acute chest pain 05/08/2016  . Burping 05/08/2016  . Tobacco use disorder 04/20/2016  . Coronary artery disease involving native coronary artery of native heart with angina pectoris ([place]) 09/12/2015  . Essential hypertension 09/12/2015  . Hyperlipidemia 09/12/2015    Current Outpatient Medications on File Prior to Visit  Medication Sig Dispense Refill  . acetaminophen (TYLENOL) 500 MG tablet Take 2 tablets (1,000 mg total) by mouth every 6 (six) hours as needed. (Patient taking differently: Take 1,000 mg by mouth every 6 (six) hours as needed for moderate pain. ) 100 tablet 2  . aspirin EC 81 MG tablet Take 1 tablet (81 mg total) by mouth daily. 90 tablet 3  . clopidogrel (PLAVIX) 75 MG tablet Take 1 tablet (75 mg total) by mouth daily. 30 tablet 10  . Cyanocobalamin (VITAMIN B 12 PO) Take 1 tablet by mouth daily.    [name] docusate sodium (COLACE) 100 MG capsule Take 1  capsule (100 mg total) by mouth daily as needed. 30 capsule 2  . furosemide (LASIX) 40 MG tablet Take 1 tablet by mouth once daily (Patient taking differently: Take 40 mg by mouth daily. ) 90 tablet 2  . gabapentin (NEURONTIN) 300 MG capsule Take 1 capsule (300 mg total) by mouth at bedtime. 90 capsule 3  . glipiZIDE (GLUCOTROL) 10 MG tablet Take 10 mg by mouth 2 (two) times daily.    [name] icosapent Ethyl (VASCEPA) 1 g capsule Take 2 capsules (2 g total) by mouth 2 (two) times daily. 120 capsule 4  . Insulin Glargine (BASAGLAR KWIKPEN) 100 UNIT/ML Inject 0.2 mLs (20 Units total) into the skin at bedtime. 15 mL 0  . JANUVIA 100 MG tablet Take 100 mg by mouth daily.    [name] lisinopril (ZESTRIL) 20 MG tablet Take 1 tablet (20 mg total) by mouth daily. 90 tablet 3  . LORazepam (ATIVAN) 1 MG tablet Take 1 mg by mouth 2 (two) times daily as needed for anxiety.     . metFORMIN (GLUCOPHAGE-XR) 500 MG 24 hr tablet Take 1,000 mg by mouth 2 (two) times daily.    . metoprolol succinate (TOPROL-XL) 50 MG 24 hr tablet Take 150 mg by mouth daily.     . Miconazole Nitrate 2 % AERO Spray to left foot at bedtime for dry scaly skin and itchiness 150 g 2  . nitroGLYCERIN (NITROSTAT) 0.4 MG SL tablet Place 1 tablet (0.4 mg total) under the tongue every 5 (five) minutes  x 3 doses as needed for chest pain. 30 tablet 11  . pantoprazole (PROTONIX) 40 MG tablet Take 1 tablet (40 mg total) by mouth daily. 60 tablet 3  . pravastatin (PRAVACHOL) 20 MG tablet Take 20 mg by mouth daily.    . vitamin C (ASCORBIC ACID) 250 MG tablet Take 250 mg by mouth daily.    . Vitamin D, Ergocalciferol, (DRISDOL) 50000 units CAPS capsule Take 50,000 Units by mouth every 7 (seven) days. Saturdays     No current facility-administered medications on file prior to visit.    Allergies  Allergen Reactions  . Tramadol     Hallucinations  . Codeine Rash    Objective: There were no vitals filed for this visit.  General: No acute distress, AAOx3   Right foot: Incision well-healed at surgical site, no warmth, no other signs of infection noted, Capillary fill time <5 seconds in all lesser digits, protective sensation severely diminished.  No pain or crepitation with range of motion right foot.  Subjective burning to the foot likely nerve related but has not bothered her much lately. No pain with calf compression.  Left foot: Resolved Dry scaly skin noted to the plantar aspect of the foot and in between the toes as well as the distal tuft of the left hallux likely consistent with tinea pruritic in nature with no active drainage no significant malodor no significant redness or warmth.  Assessment and Plan:  Problem List Items Addressed This Visit      Endocrine   Uncontrolled type 2 diabetes mellitus ([place])    Other Visit Diagnoses    S/P foot surgery, right    -  Primary   History of amputation of hallux ([place])       Tinea       Resolved     -Patient seen and evaluated -Right foot is healed -May use Gabapentin if nerve symptoms return -Continue with Lamisil spray until left foot is resolved -Continue with custom insoles and good supportive shoes daily  -Advised patient to follow up with billing office re: [phone] bill and advised her that likely this may be related to her surgery but should follow up with billing to clarify  -Return PRN  Landis Martins, DPM

## 2020-09-07 NOTE — Telephone Encounter (Signed)
Follow up:     Patient calling back because she did not receive pantoprazole 40 mg. Please advise

## 2020-09-08 MED ORDER — PANTOPRAZOLE SODIUM 40 MG PO TBEC
40.0000 mg | DELAYED_RELEASE_TABLET | Freq: Every day | ORAL | 0 refills | Status: DC
Start: 2020-09-08 — End: 2021-02-14

## 2020-09-08 NOTE — Telephone Encounter (Signed)
Yes lets refill #93 refills she needs this for her dual antiplatelet therapy

## 2020-09-08 NOTE — Telephone Encounter (Signed)
Medication sent in. 

## 2020-12-07 ENCOUNTER — Telehealth: Payer: Self-pay | Admitting: *Deleted

## 2020-12-07 NOTE — Telephone Encounter (Signed)
Called and left a message for patient and relayed the message per Dr Cannon Kettle. Jasmine Richardson

## 2020-12-17 ENCOUNTER — Other Ambulatory Visit: Payer: Self-pay

## 2020-12-17 ENCOUNTER — Inpatient Hospital Stay (HOSPITAL_COMMUNITY)
Admission: EM | Admit: 2020-12-17 | Discharge: 2020-12-20 | DRG: 225 | Disposition: A | Discharge status: Home / Self Care | Payer: Managed Care, Other (non HMO) | Attending: Internal Medicine | Admitting: Internal Medicine

## 2020-12-17 DIAGNOSIS — I2582 Chronic total occlusion of coronary artery: Secondary | ICD-10-CM | POA: Diagnosis present

## 2020-12-17 DIAGNOSIS — I252 Old myocardial infarction: Secondary | ICD-10-CM | POA: Diagnosis not present

## 2020-12-17 DIAGNOSIS — Z95810 Presence of automatic (implantable) cardiac defibrillator: Secondary | ICD-10-CM

## 2020-12-17 DIAGNOSIS — Z7902 Long term (current) use of antithrombotics/antiplatelets: Secondary | ICD-10-CM | POA: Diagnosis not present

## 2020-12-17 DIAGNOSIS — Z885 Allergy status to narcotic agent status: Secondary | ICD-10-CM

## 2020-12-17 DIAGNOSIS — Z89421 Acquired absence of other right toe(s): Secondary | ICD-10-CM

## 2020-12-17 DIAGNOSIS — Z713 Dietary counseling and surveillance: Secondary | ICD-10-CM

## 2020-12-17 DIAGNOSIS — E785 Hyperlipidemia, unspecified: Secondary | ICD-10-CM | POA: Diagnosis present

## 2020-12-17 DIAGNOSIS — Z20822 Contact with and (suspected) exposure to covid-19: Secondary | ICD-10-CM | POA: Diagnosis present

## 2020-12-17 DIAGNOSIS — I361 Nonrheumatic tricuspid (valve) insufficiency: Secondary | ICD-10-CM | POA: Diagnosis not present

## 2020-12-17 DIAGNOSIS — I110 Hypertensive heart disease with heart failure: Secondary | ICD-10-CM | POA: Diagnosis present

## 2020-12-17 DIAGNOSIS — Z6841 Body Mass Index (BMI) 40.0 and over, adult: Secondary | ICD-10-CM

## 2020-12-17 DIAGNOSIS — Z951 Presence of aortocoronary bypass graft: Secondary | ICD-10-CM

## 2020-12-17 DIAGNOSIS — I2510 Atherosclerotic heart disease of native coronary artery without angina pectoris: Secondary | ICD-10-CM | POA: Diagnosis present

## 2020-12-17 DIAGNOSIS — I255 Ischemic cardiomyopathy: Secondary | ICD-10-CM | POA: Diagnosis present

## 2020-12-17 DIAGNOSIS — Z83438 Family history of other disorder of lipoprotein metabolism and other lipidemia: Secondary | ICD-10-CM

## 2020-12-17 DIAGNOSIS — I472 Ventricular tachycardia, unspecified: Secondary | ICD-10-CM

## 2020-12-17 DIAGNOSIS — Z8249 Family history of ischemic heart disease and other diseases of the circulatory system: Secondary | ICD-10-CM

## 2020-12-17 DIAGNOSIS — Z87891 Personal history of nicotine dependence: Secondary | ICD-10-CM | POA: Diagnosis not present

## 2020-12-17 DIAGNOSIS — I25810 Atherosclerosis of coronary artery bypass graft(s) without angina pectoris: Secondary | ICD-10-CM | POA: Diagnosis present

## 2020-12-17 DIAGNOSIS — Z7984 Long term (current) use of oral hypoglycemic drugs: Secondary | ICD-10-CM

## 2020-12-17 DIAGNOSIS — Z79899 Other long term (current) drug therapy: Secondary | ICD-10-CM

## 2020-12-17 DIAGNOSIS — K219 Gastro-esophageal reflux disease without esophagitis: Secondary | ICD-10-CM | POA: Diagnosis present

## 2020-12-17 DIAGNOSIS — N179 Acute kidney failure, unspecified: Secondary | ICD-10-CM | POA: Diagnosis present

## 2020-12-17 DIAGNOSIS — Z7982 Long term (current) use of aspirin: Secondary | ICD-10-CM

## 2020-12-17 DIAGNOSIS — I248 Other forms of acute ischemic heart disease: Secondary | ICD-10-CM | POA: Diagnosis present

## 2020-12-17 DIAGNOSIS — E1165 Type 2 diabetes mellitus with hyperglycemia: Secondary | ICD-10-CM | POA: Diagnosis present

## 2020-12-17 DIAGNOSIS — I5032 Chronic diastolic (congestive) heart failure: Secondary | ICD-10-CM | POA: Diagnosis present

## 2020-12-17 DIAGNOSIS — Z955 Presence of coronary angioplasty implant and graft: Secondary | ICD-10-CM | POA: Diagnosis not present

## 2020-12-17 DIAGNOSIS — I509 Heart failure, unspecified: Secondary | ICD-10-CM | POA: Diagnosis not present

## 2020-12-17 DIAGNOSIS — E119 Type 2 diabetes mellitus without complications: Secondary | ICD-10-CM | POA: Diagnosis not present

## 2020-12-17 DIAGNOSIS — Z794 Long term (current) use of insulin: Secondary | ICD-10-CM

## 2020-12-17 HISTORY — DX: Ventricular tachycardia, unspecified: I47.20

## 2020-12-17 LAB — CBC
HCT: 36.7 % (ref 36.0–46.0)
Hemoglobin: 12.2 g/dL (ref 12.0–15.0)
MCH: 28.2 pg (ref 26.0–34.0)
MCHC: 33.2 g/dL (ref 30.0–36.0)
MCV: 84.8 fL (ref 80.0–100.0)
Platelets: 347 10*3/uL (ref 150–400)
RBC: 4.33 MIL/uL (ref 3.87–5.11)
RDW: 13 % (ref 11.5–15.5)
WBC: 13.3 10*3/uL — ABNORMAL HIGH (ref 4.0–10.5)
nRBC: 0 % (ref 0.0–0.2)

## 2020-12-17 LAB — GLUCOSE, CAPILLARY: Glucose-Capillary: 354 mg/dL — ABNORMAL HIGH (ref 70–99)

## 2020-12-17 LAB — BASIC METABOLIC PANEL
Anion gap: 15 (ref 5–15)
BUN: 19 mg/dL (ref 6–20)
CO2: 19 mmol/L — ABNORMAL LOW (ref 22–32)
Calcium: 9.4 mg/dL (ref 8.9–10.3)
Chloride: 98 mmol/L (ref 98–111)
Creatinine, Ser: 1.08 mg/dL — ABNORMAL HIGH (ref 0.44–1.00)
GFR, Estimated: 59 mL/min — ABNORMAL LOW (ref >60)
Glucose, Bld: 411 mg/dL — ABNORMAL HIGH (ref 70–99)
Potassium: 4.4 mmol/L (ref 3.5–5.1)
Sodium: 132 mmol/L — ABNORMAL LOW (ref 135–145)

## 2020-12-17 LAB — RESP PANEL BY RT-PCR (FLU A&B, COVID) ARPGX2
Influenza A by PCR: NEGATIVE
Influenza B by PCR: NEGATIVE
SARS Coronavirus 2 by RT PCR: NEGATIVE

## 2020-12-17 LAB — TROPONIN I (HIGH SENSITIVITY): Troponin I (High Sensitivity): 155 ng/L (ref <18)

## 2020-12-17 LAB — MAGNESIUM: Magnesium: 1.3 mg/dL — ABNORMAL LOW (ref 1.7–2.4)

## 2020-12-17 MED ORDER — AMIODARONE LOAD VIA INFUSION
150.0000 mg | Freq: Once | INTRAVENOUS | Status: AC
  Administered 2020-12-17: 150 mg via INTRAVENOUS
  Filled 2020-12-17: qty 83.34

## 2020-12-17 MED ORDER — ACETAMINOPHEN 325 MG PO TABS
650.0000 mg | ORAL_TABLET | Freq: Four times a day (QID) | ORAL | Status: DC | PRN
  Administered 2020-12-19: 650 mg via ORAL
  Filled 2020-12-17: qty 2

## 2020-12-17 MED ORDER — CLOPIDOGREL BISULFATE 75 MG PO TABS
75.0000 mg | ORAL_TABLET | Freq: Every day | ORAL | Status: DC
  Administered 2020-12-18 – 2020-12-19 (×3): 75 mg via ORAL
  Filled 2020-12-17 (×3): qty 1

## 2020-12-17 MED ORDER — AMIODARONE HCL IN DEXTROSE 360-4.14 MG/200ML-% IV SOLN
30.0000 mg/h | INTRAVENOUS | Status: DC
  Administered 2020-12-18 – 2020-12-20 (×4): 30 mg/h via INTRAVENOUS
  Filled 2020-12-17 (×4): qty 200

## 2020-12-17 MED ORDER — PANTOPRAZOLE SODIUM 40 MG PO TBEC
40.0000 mg | DELAYED_RELEASE_TABLET | Freq: Every day | ORAL | Status: DC
  Administered 2020-12-18 – 2020-12-19 (×2): 40 mg via ORAL
  Filled 2020-12-17 (×2): qty 1

## 2020-12-17 MED ORDER — AMIODARONE IV BOLUS ONLY 150 MG/100ML
150.0000 mg | Freq: Once | INTRAVENOUS | Status: AC
  Administered 2020-12-17: 150 mg via INTRAVENOUS

## 2020-12-17 MED ORDER — ASPIRIN EC 81 MG PO TBEC: 81.0000 mg | DELAYED_RELEASE_TABLET | Freq: Every day | ORAL | Status: DC

## 2020-12-17 MED ORDER — METOPROLOL SUCCINATE ER 100 MG PO TB24
150.0000 mg | ORAL_TABLET | Freq: Every day | ORAL | Status: DC
  Administered 2020-12-18 – 2020-12-19 (×2): 150 mg via ORAL
  Filled 2020-12-17 (×3): qty 1.5

## 2020-12-17 MED ORDER — INSULIN GLARGINE 100 UNIT/ML ~~LOC~~ SOLN
15.0000 [IU] | Freq: Every day | SUBCUTANEOUS | Status: DC
  Administered 2020-12-18 – 2020-12-19 (×2): 15 [IU] via SUBCUTANEOUS
  Filled 2020-12-17 (×5): qty 0.15

## 2020-12-17 MED ORDER — SODIUM CHLORIDE 0.9 % IV BOLUS
1000.0000 mL | Freq: Once | INTRAVENOUS | Status: AC
  Administered 2020-12-17: 1000 mL via INTRAVENOUS

## 2020-12-17 MED ORDER — SODIUM CHLORIDE 0.9 % IV SOLN
250.0000 mL | INTRAVENOUS | Status: DC | PRN
  Administered 2020-12-18: 250 mL via INTRAVENOUS

## 2020-12-17 MED ORDER — FENTANYL CITRATE (PF) 100 MCG/2ML IJ SOLN
INTRAMUSCULAR | Status: AC | PRN
  Administered 2020-12-17 (×2): 25 ug via INTRAVENOUS

## 2020-12-17 MED ORDER — CHLORHEXIDINE GLUCONATE CLOTH 2 % EX PADS: 6.0000 | MEDICATED_PAD | Freq: Every day | CUTANEOUS | Status: DC

## 2020-12-17 MED ORDER — MIDAZOLAM HCL 2 MG/2ML IJ SOLN
INTRAMUSCULAR | Status: AC
  Filled 2020-12-17: qty 2

## 2020-12-17 MED ORDER — ASPIRIN EC 81 MG PO TBEC
81.0000 mg | DELAYED_RELEASE_TABLET | Freq: Every day | ORAL | Status: DC
  Administered 2020-12-18: 81 mg via ORAL
  Filled 2020-12-17 (×2): qty 1

## 2020-12-17 MED ORDER — LIDOCAINE HCL (CARDIAC) PF 100 MG/5ML IV SOSY
100.0000 mg | PREFILLED_SYRINGE | Freq: Once | INTRAVENOUS | Status: AC
  Administered 2020-12-17: 100 mg via INTRAVENOUS
  Filled 2020-12-17: qty 5

## 2020-12-17 MED ORDER — ICOSAPENT ETHYL 1 G PO CAPS
2.0000 g | ORAL_CAPSULE | Freq: Two times a day (BID) | ORAL | Status: DC
  Administered 2020-12-18 – 2020-12-19 (×5): 2 g via ORAL
  Filled 2020-12-17 (×7): qty 2

## 2020-12-17 MED ORDER — SODIUM CHLORIDE 0.9% FLUSH: 3.0000 mL | INTRAVENOUS | Status: DC | PRN

## 2020-12-17 MED ORDER — INSULIN ASPART 100 UNIT/ML ~~LOC~~ SOLN: 0.0000 [IU] | Freq: Three times a day (TID) | SUBCUTANEOUS | Status: DC

## 2020-12-17 MED ORDER — PRAVASTATIN SODIUM 10 MG PO TABS
20.0000 mg | ORAL_TABLET | Freq: Every day | ORAL | Status: DC
  Administered 2020-12-18 – 2020-12-19 (×2): 20 mg via ORAL
  Filled 2020-12-17 (×2): qty 2

## 2020-12-17 MED ORDER — MIDAZOLAM HCL 2 MG/2ML IJ SOLN
INTRAMUSCULAR | Status: AC | PRN
Start: 2020-12-17 — End: 2020-12-17
  Administered 2020-12-17 (×2): 1 mg via INTRAVENOUS

## 2020-12-17 MED ORDER — SODIUM CHLORIDE 0.9% FLUSH
3.0000 mL | Freq: Two times a day (BID) | INTRAVENOUS | Status: DC
  Administered 2020-12-18 (×3): 3 mL via INTRAVENOUS

## 2020-12-17 MED ORDER — NITROGLYCERIN 0.4 MG SL SUBL: 0.4000 mg | SUBLINGUAL_TABLET | SUBLINGUAL | Status: DC | PRN

## 2020-12-17 MED ORDER — HEPARIN SODIUM (PORCINE) 5000 UNIT/ML IJ SOLN
5000.0000 [IU] | Freq: Three times a day (TID) | INTRAMUSCULAR | Status: DC
  Administered 2020-12-18 – 2020-12-19 (×5): 5000 [IU] via SUBCUTANEOUS
  Filled 2020-12-17 (×5): qty 1

## 2020-12-17 MED ORDER — DOCUSATE SODIUM 100 MG PO CAPS: 100.0000 mg | ORAL_CAPSULE | Freq: Every day | ORAL | Status: DC | PRN

## 2020-12-17 MED ORDER — FENTANYL CITRATE (PF) 100 MCG/2ML IJ SOLN
INTRAMUSCULAR | Status: AC
  Filled 2020-12-17: qty 2

## 2020-12-17 MED ORDER — AMIODARONE HCL IN DEXTROSE 360-4.14 MG/200ML-% IV SOLN
60.0000 mg/h | INTRAVENOUS | Status: DC
  Administered 2020-12-17 – 2020-12-18 (×2): 60 mg/h via INTRAVENOUS
  Filled 2020-12-17 (×2): qty 200

## 2020-12-17 NOTE — ED Notes (Signed)
Jasmine Richardson [phone] husband phone number

## 2020-12-17 NOTE — ED Provider Notes (Signed)
[facility] EMERGENCY DEPARTMENT Provider Note   CSN: [id number] Arrival date & time: 12/17/20  2024     History Chief Complaint  Patient presents with  . Chest Pain    Jasmine Richardson is a 60 y.o. female.  Patient with history of extensive cardiac disease stents in cardiac surgery, presents with chest pain and palpitations and tachycardia.  Denies any fevers or cough no vomiting or diarrhea.  She states she took a nitro at home with improvement of her chest pain.  Scribes aching in the mid chest nothing seems to make it better other than the nitro she took.        Past Medical History:  Diagnosis Date  . Acute chest pain 05/08/2016  . Angina pectoris ([place]) 09/11/2017  . Burping 05/08/2016  . Chronic diastolic heart failure ([place]) 11/06/2016  . Coronary artery disease involving native coronary artery of native heart with angina pectoris ([place]) 09/12/2015   Overview:  PCI and stent of RCA 2009, last cath 2012 with medical therapy  CABG May 2017  . Emphysema lung ([place]) 09/11/2017  . Essential hypertension 09/12/2015  . GERD (gastroesophageal reflux disease) 05/08/2016  . Hyperlipidemia 09/12/2015  . Mediastinitis 06/28/2016  . Morbid obesity ([place]) 05/08/2016  . S/P CABG (coronary artery bypass graft) 06/03/2016   Overview:  The patient underwent sternal reconstruction on 06/21/16 with pec flaps for mediastinitis from a prior CABG in May 2017. On admission, she was critically ill from sepsis and had altered mental status. She was last seen in clinic on 08/09/16 at which time she was doing well.  . Severe sepsis ([place]) 06/03/2016  . Sinus tachycardia 05/08/2016  . Tobacco use disorder 04/20/2016   Overview:  Quit in May 2017.  [name] Uncontrolled type 2 diabetes mellitus ([place]) 05/08/2016  . Wound, surgical, infected 06/06/2016   Overview:  sternal    Patient Active Problem List   Diagnosis Date Noted  . Unstable angina ([place]) 05/22/2020  . NSTEMI (non-ST elevated myocardial  infarction) ([place]) 05/22/2020  . Diabetic foot ulcer ([place]) 02/16/2020  . Angina pectoris ([place]) 09/11/2017  . Emphysema lung ([place]) 09/11/2017  . Chronic diastolic heart failure ([place]) 11/06/2016  . Mediastinitis 06/28/2016  . Wound, surgical, infected 06/06/2016  . S/P CABG (coronary artery bypass graft) 06/03/2016  . Severe sepsis ([place]) 06/03/2016  . GERD (gastroesophageal reflux disease) 05/08/2016  . Morbid obesity ([place]) 05/08/2016  . Uncontrolled type 2 diabetes mellitus ([place]) 05/08/2016  . Sinus tachycardia 05/08/2016  . Acute chest pain 05/08/2016  . Burping 05/08/2016  . Tobacco use disorder 04/20/2016  . Coronary artery disease involving native coronary artery of native heart with angina pectoris ([place]) 09/12/2015  . Essential hypertension 09/12/2015  . Hyperlipidemia 09/12/2015    Past Surgical History:  Procedure Laterality Date  . AMPUTATION TOE Right 02/20/2020   Procedure: AMPUTATION RIGHT GREAT  TOE;  Surgeon: Felipa Furnace, DPM;  Location: [facility];  Service: Podiatry;  Laterality: Right;  . BLADDER SURGERY    . CARDIAC CATHETERIZATION    . CORONARY ARTERY BYPASS GRAFT    . CORONARY STENT INTERVENTION N/A 05/25/2020   Procedure: CORONARY STENT INTERVENTION;  Surgeon: Wellington Hampshire, MD;  Location: [facility];  Service: Cardiovascular;  Laterality: N/A;  . CORONARY STENT INTERVENTION N/A 06/22/2020   Procedure: CORONARY STENT INTERVENTION;  Surgeon: Leonie Man, MD;  Location: [facility];  Service: Cardiovascular;  Laterality: N/A;  . INCISION AND DRAINAGE Right 02/19/2020   Procedure:  INCISION AND DRAINAGE;  Surgeon: Trula Slade, DPM;  Location: [facility];  Service: Podiatry;  Laterality: Right;  Block done by surgeon  . INTRAVASCULAR ULTRASOUND/IVUS N/A 06/22/2020   Procedure: Intravascular Ultrasound/IVUS;  Surgeon: Leonie Man, MD;  Location: [facility];  Service: Cardiovascular;  Laterality: N/A;  . LEFT HEART CATH AND CORS/GRAFTS  ANGIOGRAPHY N/A 05/23/2020   Procedure: LEFT HEART CATH AND CORS/GRAFTS ANGIOGRAPHY;  Surgeon: Nelva Bush, MD;  Location: [place];  Service: Cardiovascular;  Laterality: N/A;  . METATARSAL HEAD EXCISION Right 05/02/2020   Procedure: FIRST METATARSAL HEAD RESECTION; RIGHT FOOT WOUND CLOSURE;  Surgeon: Landis Martins, DPM;  Location: [facility];  Service: Podiatry;  Laterality: Right;  MAC W/LOCAL  . TUBAL LIGATION       OB History   No obstetric history on file.     Family History  Problem Relation Age of Onset  . Hypertension Mother   . Hyperlipidemia Mother   . Heart attack Father   . Heart disease Father   . Hypertension Father   . Alzheimer's disease Father   . Hypertension Brother   . Hyperlipidemia Brother     Social History   Tobacco Use  . Smoking status: Former Smoker    Quit date: 05/2016    Years since quitting: 4.5  . Smokeless tobacco: Never Used  Vaping Use  . Vaping Use: Never used  Substance Use Topics  . Alcohol use: No  . Drug use: No    Home Medications Prior to Admission medications   Medication Sig Start Date End Date Taking? Authorizing Provider  acetaminophen (TYLENOL) 500 MG tablet Take 2 tablets (1,000 mg total) by mouth every 6 (six) hours as needed. Patient taking differently: Take 1,000 mg by mouth every 6 (six) hours as needed for moderate pain.  05/02/20 05/02/21  Landis Martins, DPM  aspirin EC 81 MG tablet Take 1 tablet (81 mg total) by mouth daily. 05/13/20   Richardo Priest, MD  clopidogrel (PLAVIX) 75 MG tablet Take 1 tablet (75 mg total) by mouth daily. Patient taking differently: Take 75 mg by mouth at bedtime. 03/11/20   Richardo Priest, MD  Cyanocobalamin (VITAMIN B 12 PO) Take 1 tablet by mouth daily.    [provider]  docusate sodium (COLACE) 100 MG capsule Take 1 capsule (100 mg total) by mouth daily as needed. 05/02/20 05/02/21  Landis Martins, DPM  furosemide (LASIX) 40 MG tablet Take 1  tablet by mouth once daily Patient taking differently: Take 40 mg by mouth daily.  02/15/20   Richardo Priest, MD  gabapentin (NEURONTIN) 300 MG capsule Take 1 capsule (300 mg total) by mouth at bedtime. 08/10/20   Stover, Titorya, DPM  glipiZIDE (GLUCOTROL) 10 MG tablet Take 10 mg by mouth 2 (two) times daily.    [provider]  icosapent Ethyl (VASCEPA) 1 g capsule Take 2 capsules (2 g total) by mouth 2 (two) times daily. 05/26/20   Furth, Cadence H, PA-C  Insulin Glargine (BASAGLAR KWIKPEN) 100 UNIT/ML Inject 0.2 mLs (20 Units total) into the skin at bedtime. Patient taking differently: Inject 15 Units into the skin daily. 02/21/20   Barb Merino, MD  JANUVIA 100 MG tablet Take 100 mg by mouth daily. 02/05/20   [provider]  lisinopril (ZESTRIL) 20 MG tablet Take 1 tablet (20 mg total) by mouth daily. 08/30/20   Richardo Priest, MD  LORazepam (ATIVAN) 1 MG tablet Take 1  mg by mouth 2 (two) times daily as needed for anxiety.     [provider]  metFORMIN (GLUCOPHAGE-XR) 500 MG 24 hr tablet Take 1,000 mg by mouth 2 (two) times daily. 02/15/20   [provider]  metoprolol succinate (TOPROL-XL) 50 MG 24 hr tablet Take 150 mg by mouth daily.  10/18/16   [provider]  Miconazole Nitrate 2 % AERO Spray to left foot at bedtime for dry scaly skin and itchiness 08/10/20   Landis Martins, DPM  nitroGLYCERIN (NITROSTAT) 0.4 MG SL tablet Place 1 tablet (0.4 mg total) under the tongue every 5 (five) minutes x 3 doses as needed for chest pain. 07/20/19   Richardo Priest, MD  pantoprazole (PROTONIX) 40 MG tablet Take 1 tablet (40 mg total) by mouth daily. 09/08/20   Richardo Priest, MD  pravastatin (PRAVACHOL) 20 MG tablet Take 20 mg by mouth daily. 08/02/20   [provider]  vitamin C (ASCORBIC ACID) 250 MG tablet Take 250 mg by mouth daily.    [provider]  Vitamin D, Ergocalciferol, (DRISDOL) 50000 units CAPS capsule Take 50,000 Units by mouth  every 7 (seven) days. Saturdays    [provider]    Allergies    Tramadol and Codeine  Review of Systems   Review of Systems  Constitutional: Negative for fever.  HENT: Negative for ear pain.   Eyes: Negative for pain.  Respiratory: Negative for cough.   Cardiovascular: Positive for chest pain and palpitations.  Gastrointestinal: Negative for abdominal pain.  Genitourinary: Negative for flank pain.  Musculoskeletal: Negative for back pain.  Skin: Negative for rash.  Neurological: Negative for headaches.    Physical Exam Updated Vital Signs BP (!) 119/97   Pulse (!) 149   Temp 98.2 F (36.8 C) (Oral)   Resp 20   LMP  (LMP Unknown)   SpO2 92%   Physical Exam Constitutional:      General: She is not in acute distress.    Appearance: Normal appearance.  HENT:     Head: Normocephalic.     Nose: Nose normal.  Eyes:     Extraocular Movements: Extraocular movements intact.  Cardiovascular:     Rate and Rhythm: Regular rhythm. Tachycardia present.  Pulmonary:     Effort: Pulmonary effort is normal. Tachypnea present.  Musculoskeletal:        General: Normal range of motion.     Cervical back: Normal range of motion.  Neurological:     General: No focal deficit present.     Mental Status: She is alert. Mental status is at baseline.     ED Results / Procedures / Treatments   Labs (all labs ordered are listed, but only abnormal results are displayed) Labs Reviewed  BASIC METABOLIC PANEL - Abnormal; Notable for the following components:      Result Value   Sodium 132 (*)    CO2 19 (*)    Glucose, Bld 411 (*)    Creatinine, Ser 1.08 (*)    GFR, Estimated 59 (*)    All other components within normal limits  CBC - Abnormal; Notable for the following components:   WBC 13.3 (*)    All other components within normal limits  MAGNESIUM - Abnormal; Notable for the following components:   Magnesium 1.3 (*)    All other components within normal limits   TROPONIN I (HIGH SENSITIVITY) - Abnormal; Notable for the following components:   Troponin I (High Sensitivity) 155 (*)  All other components within normal limits  RESP PANEL BY RT-PCR (FLU A&B, COVID) ARPGX2  I-STAT BETA HCG BLOOD, ED ([facility], [facility], [facility] ONLY)  TROPONIN I (HIGH SENSITIVITY)    EKG None  Radiology No results found.  Procedures .Critical Care Performed by: Luna Fuse, MD Authorized by: Luna Fuse, MD   Critical care provider statement:    Critical care time (minutes):  50   Critical care time was exclusive of:  Separately billable procedures and treating other patients and teaching time   Critical care was necessary to treat or prevent imminent or life-threatening deterioration of the following conditions:  Cardiac failure Comments:     Ventricular tachycardia   (including critical care time)  Medications Ordered in ED Medications  amiodarone (NEXTERONE) 1.8 mg/mL load via infusion 150 mg (150 mg Intravenous Bolus from Bag 12/17/20 2050)    Followed by  amiodarone (NEXTERONE PREMIX) 360-4.14 MG/200ML-% (1.8 mg/mL) IV infusion (60 mg/hr Intravenous New Bag/Given 12/17/20 2101)    Followed by  amiodarone (NEXTERONE PREMIX) 360-4.14 MG/200ML-% (1.8 mg/mL) IV infusion (has no administration in time range)  sodium chloride 0.9 % bolus 1,000 mL (0 mLs Intravenous Stopped 12/17/20 2132)  lidocaine (cardiac) 100 mg/49m (XYLOCAINE) injection 2% 100 mg (100 mg Intravenous Given 12/17/20 2141)  amiodarone (NEXTERONE) IV bolus only 150 mg/100 mL (0 mg Intravenous Stopped 12/17/20 2210)    ED Course  I have reviewed the triage vital signs and the nursing notes.  Pertinent labs & imaging results that were available during my care of the patient were reviewed by me and considered in my medical decision making (see chart for details).  Clinical Course as of 12/17/20 2232  Sat Dec 17, 2020  2118 D/w oncall cardiology who will come and see patient.  [JH]    Clinical Course  User Index [JH] [place] JGreggory Brandy MD   MDM Rules/Calculators/A&P                          I was called by nursing immediately to the patient's bedside.  She presents to ER in ventricular tachycardia with a heart rate about 100 8290 bpm.  EKG showed wide-complex tachycardia with a very regular rhythm.  Given patient's history, amiodarone was initiated.  150 mg IV and then a drip.  Heart rate appears have improved to about 150 bpm but persistently tachycardic still.  Blood pressure remained stable.  Case discussed cardiology who has come to see the patient.  She will be admitted to the hospital. Final Clinical Impression(s) / ED Diagnoses Final diagnoses:  Ventricular tachycardia ([facility]    Rx / DC Orders ED Discharge Orders    None       HLuna Fuse MD 12/17/20 2232

## 2020-12-17 NOTE — ED Notes (Addendum)
Date and time results received: 12/17/20 2146 (use smartphrase ".now" to insert current time)  Test: troponin Critical Value: 155  Name of Provider Notified: Hong MD  Orders Received? Or Actions Taken?: cards at bedside

## 2020-12-17 NOTE — ED Notes (Signed)
Pt was cardioverted. Cardiology at bedside. Amiodarone resumed.

## 2020-12-17 NOTE — ED Triage Notes (Addendum)
Pt presents to ED POv. Pt c/o central Cp. Pain is non radiating. Pt has hx of MI x2 and CABG x2. HR - 200's. Pt took nitro x2 and relieved most pain. Pt also took lorazepam x1

## 2020-12-17 NOTE — ED Notes (Signed)
Cardiology at bedside for eval

## 2020-12-18 ENCOUNTER — Encounter (HOSPITAL_COMMUNITY): Payer: Self-pay | Admitting: Internal Medicine

## 2020-12-18 ENCOUNTER — Inpatient Hospital Stay (HOSPITAL_COMMUNITY): Payer: Managed Care, Other (non HMO)

## 2020-12-18 DIAGNOSIS — I361 Nonrheumatic tricuspid (valve) insufficiency: Secondary | ICD-10-CM | POA: Diagnosis not present

## 2020-12-18 DIAGNOSIS — I509 Heart failure, unspecified: Secondary | ICD-10-CM | POA: Diagnosis not present

## 2020-12-18 DIAGNOSIS — E119 Type 2 diabetes mellitus without complications: Secondary | ICD-10-CM | POA: Diagnosis not present

## 2020-12-18 DIAGNOSIS — N179 Acute kidney failure, unspecified: Secondary | ICD-10-CM

## 2020-12-18 DIAGNOSIS — I472 Ventricular tachycardia: Principal | ICD-10-CM

## 2020-12-18 LAB — HEMOGLOBIN A1C
Hgb A1c MFr Bld: 11.2 % — ABNORMAL HIGH (ref 4.8–5.6)
Mean Plasma Glucose: 274.74 mg/dL

## 2020-12-18 LAB — TROPONIN I (HIGH SENSITIVITY): Troponin I (High Sensitivity): 2286 ng/L (ref <18)

## 2020-12-18 LAB — BASIC METABOLIC PANEL
Anion gap: 12 (ref 5–15)
BUN: 15 mg/dL (ref 6–20)
CO2: 23 mmol/L (ref 22–32)
Calcium: 9 mg/dL (ref 8.9–10.3)
Chloride: 99 mmol/L (ref 98–111)
Creatinine, Ser: 1.07 mg/dL — ABNORMAL HIGH (ref 0.44–1.00)
GFR, Estimated: 59 mL/min — ABNORMAL LOW (ref >60)
Glucose, Bld: 369 mg/dL — ABNORMAL HIGH (ref 70–99)
Potassium: 4.3 mmol/L (ref 3.5–5.1)
Sodium: 134 mmol/L — ABNORMAL LOW (ref 135–145)

## 2020-12-18 LAB — ECHOCARDIOGRAM COMPLETE
Area-P 1/2: 2.73 cm2
Calc EF: 47.5 %
S' Lateral: 4.1 cm
Single Plane A2C EF: 51.1 %
Single Plane A4C EF: 44.2 %
Weight: 3742.53 oz

## 2020-12-18 LAB — MRSA PCR SCREENING: MRSA by PCR: NEGATIVE

## 2020-12-18 LAB — CBC
HCT: 31.9 % — ABNORMAL LOW (ref 36.0–46.0)
Hemoglobin: 11.2 g/dL — ABNORMAL LOW (ref 12.0–15.0)
MCH: 29.3 pg (ref 26.0–34.0)
MCHC: 35.1 g/dL (ref 30.0–36.0)
MCV: 83.5 fL (ref 80.0–100.0)
Platelets: 254 10*3/uL (ref 150–400)
RBC: 3.82 MIL/uL — ABNORMAL LOW (ref 3.87–5.11)
RDW: 13.1 % (ref 11.5–15.5)
WBC: 8.8 10*3/uL (ref 4.0–10.5)
nRBC: 0 % (ref 0.0–0.2)

## 2020-12-18 LAB — GLUCOSE, CAPILLARY
Glucose-Capillary: 265 mg/dL — ABNORMAL HIGH (ref 70–99)
Glucose-Capillary: 258 mg/dL — ABNORMAL HIGH (ref 70–99)
Glucose-Capillary: 275 mg/dL — ABNORMAL HIGH (ref 70–99)
Glucose-Capillary: 276 mg/dL — ABNORMAL HIGH (ref 70–99)

## 2020-12-18 LAB — SURGICAL PCR SCREEN
MRSA, PCR: NEGATIVE
Staphylococcus aureus: NEGATIVE

## 2020-12-18 LAB — MAGNESIUM: Magnesium: 1.3 mg/dL — ABNORMAL LOW (ref 1.7–2.4)

## 2020-12-18 LAB — BRAIN NATRIURETIC PEPTIDE: B Natriuretic Peptide: 113.2 pg/mL — ABNORMAL HIGH (ref 0.0–100.0)

## 2020-12-18 MED ORDER — SODIUM CHLORIDE 0.9 % IV SOLN: 250.0000 mL | INTRAVENOUS | Status: DC | PRN

## 2020-12-18 MED ORDER — SODIUM CHLORIDE 0.9% FLUSH
3.0000 mL | Freq: Two times a day (BID) | INTRAVENOUS | Status: DC
  Administered 2020-12-18: 3 mL via INTRAVENOUS

## 2020-12-18 MED ORDER — DIPHENHYDRAMINE HCL 50 MG/ML IJ SOLN
25.0000 mg | Freq: Once | INTRAMUSCULAR | Status: AC
  Administered 2020-12-18: 25 mg via INTRAVENOUS
  Filled 2020-12-18: qty 1

## 2020-12-18 MED ORDER — SODIUM CHLORIDE 0.9 % WEIGHT BASED INFUSION
3.0000 mL/kg/h | INTRAVENOUS | Status: DC
  Administered 2020-12-19: 3 mL/kg/h via INTRAVENOUS

## 2020-12-18 MED ORDER — SODIUM CHLORIDE 0.9 % IV SOLN
80.0000 mg | INTRAVENOUS | Status: DC
  Administered 2020-12-19: 80 mg
  Filled 2020-12-18: qty 2

## 2020-12-18 MED ORDER — CEFAZOLIN SODIUM-DEXTROSE 2-4 GM/100ML-% IV SOLN
2.0000 g | INTRAVENOUS | Status: DC
  Administered 2020-12-19: 2 g via INTRAVENOUS
  Filled 2020-12-18: qty 100

## 2020-12-18 MED ORDER — PERFLUTREN LIPID MICROSPHERE
INTRAVENOUS | Status: AC
  Filled 2020-12-18: qty 10

## 2020-12-18 MED ORDER — SODIUM CHLORIDE 0.9 % WEIGHT BASED INFUSION: 1.0000 mL/kg/h | INTRAVENOUS | Status: DC

## 2020-12-18 MED ORDER — SODIUM CHLORIDE 0.9% FLUSH: 3.0000 mL | INTRAVENOUS | Status: DC | PRN

## 2020-12-18 MED ORDER — ASPIRIN 81 MG PO CHEW
81.0000 mg | CHEWABLE_TABLET | ORAL | Status: AC
  Administered 2020-12-19: 81 mg via ORAL
  Filled 2020-12-18: qty 1

## 2020-12-18 MED ORDER — INSULIN ASPART 100 UNIT/ML ~~LOC~~ SOLN
0.0000 [IU] | Freq: Three times a day (TID) | SUBCUTANEOUS | Status: DC
  Administered 2020-12-18: 8 [IU] via SUBCUTANEOUS
  Administered 2020-12-18: 15 [IU] via SUBCUTANEOUS
  Administered 2020-12-18 – 2020-12-19 (×4): 8 [IU] via SUBCUTANEOUS

## 2020-12-18 MED ORDER — PERFLUTREN LIPID MICROSPHERE
INTRAVENOUS | Status: AC
  Administered 2020-12-18: 2 mL
  Filled 2020-12-18: qty 10

## 2020-12-18 MED ORDER — SODIUM CHLORIDE 0.9 % IV SOLN: INTRAVENOUS | Status: DC

## 2020-12-18 MED ORDER — LISINOPRIL 20 MG PO TABS
20.0000 mg | ORAL_TABLET | Freq: Every day | ORAL | Status: DC
  Administered 2020-12-18 – 2020-12-19 (×2): 20 mg via ORAL
  Filled 2020-12-18 (×2): qty 1

## 2020-12-18 MED ORDER — MAGNESIUM SULFATE 4 GM/100ML IV SOLN
4.0000 g | Freq: Once | INTRAVENOUS | Status: AC
  Administered 2020-12-18: 4 g via INTRAVENOUS
  Filled 2020-12-18: qty 100

## 2020-12-18 NOTE — H&P (Signed)
CARDIOLOGY ADMISSION NOTE  [name] MRN: [id number] DOB/AGE: March 25, 1961 60 y.o.  Admit date: 12/17/2020 Primary Physician   Ernestene Kiel, MD Primary Cardiologist   Shirlee More, MD Chief Complaint    Chest Discomfort  HPI:   Jasmine Richardson is a 59 y.o. female with a hx of CAD s/p CABG (04/23/16 c/b post operative mediastinitis and reoperationwith amyocutaneous pectoral flaps) and recent complex PCI of LM disease (06/2020), HFpEF, poorly controlled DMII (A1c 11.2%), hypertension, hyperlipidemia, morbid obesity, and GERD who presents to he ED with acute onset chest discomfort and was found to be in wide complex tachycardia concerning for sustained ventricular tachycardia.   Patient was in her [place] until the evening of 12/18/19 when while she was cooking she developed acute onset chest discomfort with associated shortness of breath. She took some SLNTG with only mild improvement. She is not able to thoroughly describe the discomfort, but states that it is in the center of her chest and without radiation. She otherwise denies an exertional component to her pain, nausea/vomiting, paroxysmal nocturnal dyspnea/orthopnea, irregular heart beat/palpitations, presyncope/syncope, or lower extremity edema.  When she presented to the ED, she was found to be in a regular wide-complex tachycardia with rates in the 180s. She was given a 155m bolus of amiodarone with an improvement in her heart rates to the 150s with some improvement of her chest discomfort and dyspnea. She was subsequently given 1076mof lidocaine and then another bolus of amio without any change in her rhythm. Finally, she was cardioverted to NSR. Labs were notable for hyperglycemia, sCr of 1.08, Mg 1.3, Trop 155, WBC 13.3.     Past Medical History:  Diagnosis Date  . Acute chest pain 05/08/2016  . Angina pectoris ([facility] [place]9/26/2018  . Burping 05/08/2016  . Chronic diastolic heart failure (HCP[facility]11/21/2017  . Coronary  artery disease involving native coronary artery of native heart with angina pectoris (HC[facility]9/26/2016   Overview:  PCI and stent of RCA 2009, last cath 2012 with medical therapy  CABG May 2017  . Emphysema lung (HC[facility]9/26/2018  . Essential hypertension 09/12/2015  . GERD (gastroesophageal reflux disease) 05/08/2016  . Hyperlipidemia 09/12/2015  . Mediastinitis 06/28/2016  . Morbid obesity (HCGe[place]5/23/2017  . S/P CABG (coronary artery bypass graft) 06/03/2016   Overview:  The patient underwent sternal reconstruction on 06/21/16 with pec flaps for mediastinitis from a prior CABG in May 2017. On admission, she was critically ill from sepsis and had altered mental status. She was last seen in clinic on 08/09/16 at which time she was doing well.  . Severe sepsis (HCBu[facility] [place]6/18/2017  . Sinus tachycardia 05/08/2016  . Tobacco use disorder 04/20/2016   Overview:  Quit in May 2017.  . Marland Kitchenncontrolled type 2 diabetes mellitus (HCGen[place]5/23/2017  . Wound, surgical, infected 06/06/2016   Overview:  sternal    Past Surgical History:  Procedure Laterality Date  . AMPUTATION TOE Right 02/20/2020   Procedure: AMPUTATION RIGHT GREAT  TOE;  Surgeon: PaFelipa FurnaceDPM;  [facility] Service: Podiatry;  Laterality: Right;  . BLADDER SURGERY    . CARDIAC CATHETERIZATION    . CORONARY ARTERY BYPASS GRAFT    . CORONARY STENT INTERVENTION N/A 05/25/2020   Procedure: CORONARY STENT INTERVENTION;  Surgeon: ArWellington HampshireMD;  [facility];  Service: Cardiovascular;  Laterality: N/A;  . CORONARY STENT INTERVENTION N/A 06/22/2020   Procedure: CORONARY STENT INTERVENTION;  Surgeon: HaLeonie ManMD;  Location: [place] [facility];  Service: Cardiovascular;  Laterality: N/A;  . INCISION AND DRAINAGE Right 02/19/2020   Procedure: INCISION AND DRAINAGE;  Surgeon: Trula Slade, DPM;  Location: [place];  Service: Podiatry;  Laterality: Right;  Block done by surgeon  . INTRAVASCULAR ULTRASOUND/IVUS N/A 06/22/2020    Procedure: Intravascular Ultrasound/IVUS;  Surgeon: Leonie Man, MD;  Location: [place] [facility];  Service: Cardiovascular;  Laterality: N/A;  . LEFT HEART CATH AND CORS/GRAFTS ANGIOGRAPHY N/A 05/23/2020   Procedure: LEFT HEART CATH AND CORS/GRAFTS ANGIOGRAPHY;  Surgeon: Nelva Bush, MD;  Location: [place] [facility];  Service: Cardiovascular;  Laterality: N/A;  . METATARSAL HEAD EXCISION Right 05/02/2020   Procedure: FIRST METATARSAL HEAD RESECTION; RIGHT FOOT WOUND CLOSURE;  Surgeon: Landis Martins, DPM;  Location: [place];  Service: Podiatry;  Laterality: Right;  MAC W/LOCAL  . TUBAL LIGATION      Allergies  Allergen Reactions  . Tramadol     Hallucinations  . Codeine Rash   No current facility-administered medications on file prior to encounter.   Current Outpatient Medications on File Prior to Encounter  Medication Sig Dispense Refill  . acetaminophen (TYLENOL) 500 MG tablet Take 2 tablets (1,000 mg total) by mouth every 6 (six) hours as needed. (Patient taking differently: Take 1,000 mg by mouth every 6 (six) hours as needed for moderate pain. ) 100 tablet 2  . aspirin EC 81 MG tablet Take 1 tablet (81 mg total) by mouth daily. 90 tablet 3  . clopidogrel (PLAVIX) 75 MG tablet Take 1 tablet (75 mg total) by mouth daily. (Patient taking differently: Take 75 mg by mouth at bedtime.) 30 tablet 10  . Cyanocobalamin (VITAMIN B 12 PO) Take 1 tablet by mouth daily.    [name] docusate sodium (COLACE) 100 MG capsule Take 1 capsule (100 mg total) by mouth daily as needed. 30 capsule 2  . furosemide (LASIX) 40 MG tablet Take 1 tablet by mouth once daily (Patient taking differently: Take 40 mg by mouth daily. ) 90 tablet 2  . gabapentin (NEURONTIN) 300 MG capsule Take 1 capsule (300 mg total) by mouth at bedtime. 90 capsule 3  . glipiZIDE (GLUCOTROL) 10 MG tablet Take 10 mg by mouth 2 (two) times daily.    [name] icosapent Ethyl (VASCEPA) 1 g capsule Take 2 capsules (2 g total) by  mouth 2 (two) times daily. 120 capsule 4  . Insulin Glargine (BASAGLAR KWIKPEN) 100 UNIT/ML Inject 0.2 mLs (20 Units total) into the skin at bedtime. (Patient taking differently: Inject 15 Units into the skin daily.) 15 mL 0  . JANUVIA 100 MG tablet Take 100 mg by mouth daily.    [name] lisinopril (ZESTRIL) 20 MG tablet Take 1 tablet (20 mg total) by mouth daily. 90 tablet 3  . LORazepam (ATIVAN) 1 MG tablet Take 1 mg by mouth 2 (two) times daily as needed for anxiety.     . metFORMIN (GLUCOPHAGE-XR) 500 MG 24 hr tablet Take 1,000 mg by mouth 2 (two) times daily.    . metoprolol succinate (TOPROL-XL) 50 MG 24 hr tablet Take 150 mg by mouth daily.     . Miconazole Nitrate 2 % AERO Spray to left foot at bedtime for dry scaly skin and itchiness 150 g 2  . nitroGLYCERIN (NITROSTAT) 0.4 MG SL tablet Place 1 tablet (0.4 mg total) under the tongue every 5 (five) minutes x 3 doses as needed for chest pain. 30 tablet 11  . pantoprazole (  PROTONIX) 40 MG tablet Take 1 tablet (40 mg total) by mouth daily. 90 tablet 0  . pravastatin (PRAVACHOL) 20 MG tablet Take 20 mg by mouth daily.    . vitamin C (ASCORBIC ACID) 250 MG tablet Take 250 mg by mouth daily.    . Vitamin D, Ergocalciferol, (DRISDOL) 50000 units CAPS capsule Take 50,000 Units by mouth every 7 (seven) days. Saturdays     Social History   Socioeconomic History  . Marital status: Married    Spouse name: Not on file  . Number of children: Not on file  . Years of education: Not on file  . Highest education level: Not on file  Occupational History  . Not on file  Tobacco Use  . Smoking status: Former Smoker    Quit date: 05/2016    Years since quitting: 4.5  . Smokeless tobacco: Never Used  Vaping Use  . Vaping Use: Never used  Substance and Sexual Activity  . Alcohol use: No  . Drug use: No  . Sexual activity: Yes  Other Topics Concern  . Not on file  Social History Narrative  . Not on file   Social Determinants of Health   Financial  Resource Strain: Not on file  Food Insecurity: Not on file  Transportation Needs: Not on file  Physical Activity: Not on file  Stress: Not on file  Social Connections: Not on file  Intimate Partner Violence: Not on file    Family History  Problem Relation Age of Onset  . Hypertension Mother   . Hyperlipidemia Mother   . Heart attack Father   . Heart disease Father   . Hypertension Father   . Alzheimer's disease Father   . Hypertension Brother   . Hyperlipidemia Brother      Review of Systems: [y] = yes, _0  = no       General: Weight gain _1 ; Weight loss _2 ; Anorexia _3 ; Fatigue _4 ; Fever _5 ; Chills _6 ; Weakness _7     Cardiac: as reported in HPI  Pulmonary: Cough _8 ; Wheezing_9 ; Hemoptysis_10 ; Sputum _11 ; Snoring _12     GI: Vomiting_13 ; Dysphagia_14 ; Melena_15 ; Hematochezia _16 ; Heartburn_17 ; Abdominal pain _18 ; Constipation _19 ; Diarrhea _20 ; BRBPR _21     GU: Hematuria_22 ; Dysuria _23 ; Nocturia_24   Vascular: Pain in legs with walking _25 ; Pain in feet with lying flat _26 ; Non-healing sores _27 ; Stroke _28 ; TIA _29 ; Slurred speech _30 ;    Neuro: Headaches_31 ; Vertigo_32 ; Seizures_33 ; Paresthesias_34 ;Blurred vision _35 ; Diplopia _36 ; Vision changes _37     Ortho/Skin: Arthritis _38 ; Joint pain _39 ; Muscle pain _40 ; Joint swelling _41 ; Back Pain _42 ; Rash _43     Psych: Depression_44 ; Anxiety_45     Heme: Bleeding problems _46 ; Clotting disorders _47 ; Anemia _48     Endocrine: Diabetes _49 ; Thyroid dysfunction_50   Physical Exam: Blood pressure 129/86, pulse 100, temperature 97.7 F (36.5 C), temperature source Oral, resp. rate (!) 21, weight 106.1 kg, SpO2 96 %.   GENERAL: Patient is afebrile, Vital signs reviewed, Well appearing, Patient appears mildly uncomfortable, Alert and lucid. EYES: Normal inspection. HEENT:  normocephalic, atraumatic , normal ENT inspection. ORAL:  Moist NECK:  supple , normal inspection. CARD:  Tachycardic, regular rhythm, heart  sounds normal. RESP:  no respiratory distress, breath sounds  normal. ABD: soft, nontender to palpation, nondistended MUSC:  normal ROM, non-tender, no pedal edema . SKIN: color normal, no rash, warm, dry . NEURO: awake & alert, lucid, no focal deficits PSYCH: mood/affect normal.   Labs: Lab Results  Component Value Date   BUN 19 12/17/2020   Lab Results  Component Value Date   CREATININE 1.08 (H) 12/17/2020   Lab Results  Component Value Date   NA 132 (L) 12/17/2020   K 4.4 12/17/2020   CL 98 12/17/2020   CO2 19 (L) 12/17/2020   No results found for: TROPONINI Lab Results  Component Value Date   WBC 13.3 (H) 12/17/2020   HGB 12.2 12/17/2020   HCT 36.7 12/17/2020   MCV 84.8 12/17/2020   PLT 347 12/17/2020   Lab Results  Component Value Date   CHOL 208 (H) 05/22/2020   HDL 37 (L) 05/22/2020   LDLCALC UNABLE TO CALCULATE IF TRIGLYCERIDE OVER 400 mg/dL 05/22/2020   LDLDIRECT 91.4 05/22/2020   TRIG 559 (H) 05/22/2020   CHOLHDL 5.6 05/22/2020   Lab Results  Component Value Date   ALT 11 02/16/2020   AST 12 (L) 02/16/2020   ALKPHOS 62 02/16/2020   BILITOT 0.7 02/16/2020       EKG:  Wide complex tachycardia at 181 bpm  ASSESSMENT AND PLAN:   Jasmine Richardson is a 60 y.o. female with a hx of CAD s/p CABG (04/23/16 c/b post operative mediastinitis and reoperationwith amyocutaneous pectoral flaps) and recent complex PCI of LM disease (06/2020), HFpEF, poorly controlled DMII, hypertension, hyperlipidemia, morbid obesity, and GERD who presents to he ED with acute onset chest discomfort and was found to be in wide complex tachycardia concerning for sustained ventricular tachycardia.     # Sustained VT # CAD s/p CABG and complex PCI of LM disease # Troponin Elevation # HTN # HLD # Hypomagnesemia :: S/p total of 323m of amiodarone and 1060mof lidocaine with persistent VT. Now in NSR after DCCV. Chest pain and shortness of breath improved after cardioversion. EKG with  some mild ST changes most notable in V1, but normalized on repeat ECG. Suspect Type II demand ischemia in setting of known extensive coronary disease and VT for over 2 hours. Likely that her VT was triggered by a Mg level of 1.3.  - Admit to CVICU and monitor on telemetry - Discuss possible repeat LHC on Monday  - Continue amiodarone gtt - Replete and maintain electrolytes - Continue GDMIT  - Asa 81 mg + Clopidogrel 75 mg daily  - Cont home metoprolol XL 15053maily  - Cont lisinopril 12m70mily  - Cont pravastatin (reports statin intolerance in the past to high-intensity statins)  - Cont home Vascepa BID  # HFpEF - Strict intake and output - Daily weights - Hold scheduled lasix, but will give spot doses prn  # DMII - Repeat A1c - Hold oral hypoglycemics - Continue Lantus 15 units daily with SSI coverage - Hypoglycemia protocol  # AKI :: Perhaps due to hypoperfusion in the setting of VT. Received 1L IV fluids in the ED - Hold scheduled lasix  - Continue to trend    Signed: FranClois Dupes/2022, 12:22 AM

## 2020-12-18 NOTE — Progress Notes (Signed)
  Echocardiogram 2D Echocardiogram has been performed.  Jasmine Richardson 12/18/2020, 3:31 PM

## 2020-12-18 NOTE — H&P (View-Only) (Signed)
 Cardiology Consultation:   [name] MRN: [id number]; DOB: 07/07/1961  Admit date: 12/17/2020 Date of Consult: 12/18/2020  Primary Care Provider: Prochnau, Caroline, MD [facility] Cardiologist: Brian Munley, MD  [facility] Electrophysiologist:  None    Patient Profile:   Jasmine Richardson is a 59 y.o. female with a hx of CAD, s/p CABG who is being seen today for the evaluation of VT at the request of Dr. Ugowe  History of Present Illness:   Jasmine Richardson has a long h/o CAD,s/p CABG 2017 complicted by mediastinitis, s/p myocutaneous flaps, HTN, recent PIC of the LM in July 21. She was in her usual state of health when she developed sob and chest pressure and presented with a RBBB wide QRS tachycardia which was poorly tolerated. She was treated with IV amio and lidocaine but was then cardioverted while awake back to NSR. She denies recent anginal symptoms. No change in meds. Her troponin is 2K.    Past Medical History:  Diagnosis Date  . Acute chest pain 05/08/2016  . Angina pectoris (HCC) 09/11/2017  . Burping 05/08/2016  . Chronic diastolic heart failure (HCC) 11/06/2016  . Coronary artery disease involving native coronary artery of native heart with angina pectoris (HCC) 09/12/2015   Overview:  PCI and stent of RCA 2009, last cath 2012 with medical therapy  CABG May 2017  . Emphysema lung (HCC) 09/11/2017  . Essential hypertension 09/12/2015  . GERD (gastroesophageal reflux disease) 05/08/2016  . Hyperlipidemia 09/12/2015  . Mediastinitis 06/28/2016  . Morbid obesity (HCC) 05/08/2016  . S/P CABG (coronary artery bypass graft) 06/03/2016   Overview:  The patient underwent sternal reconstruction on 06/21/16 with pec flaps for mediastinitis from a prior CABG in May 2017. On admission, she was critically ill from sepsis and had altered mental status. She was last seen in clinic on 08/09/16 at which time she was doing well.  . Severe sepsis (HCC) 06/03/2016  . Sinus tachycardia  05/08/2016  . Tobacco use disorder 04/20/2016   Overview:  Quit in May 2017.  . Uncontrolled type 2 diabetes mellitus (HCC) 05/08/2016  . Wound, surgical, infected 06/06/2016   Overview:  sternal    Past Surgical History:  Procedure Laterality Date  . AMPUTATION TOE Right 02/20/2020   Procedure: AMPUTATION RIGHT GREAT  TOE;  Surgeon: Patel, Kevin P, DPM;  [facility];  Service: Podiatry;  Laterality: Right;  . BLADDER SURGERY    . CARDIAC CATHETERIZATION    . CORONARY ARTERY BYPASS GRAFT    . CORONARY STENT INTERVENTION N/A 05/25/2020   Procedure: CORONARY STENT INTERVENTION;  Surgeon: Arida, Muhammad A, MD;  [facility];  Service: Cardiovascular;  Laterality: N/A;  . CORONARY STENT INTERVENTION N/A 06/22/2020   Procedure: CORONARY STENT INTERVENTION;  Surgeon: Harding, David W, MD;  [facility];  Service: Cardiovascular;  Laterality: N/A;  . INCISION AND DRAINAGE Right 02/19/2020   Procedure: INCISION AND DRAINAGE;  Surgeon: Wagoner, Matthew R, DPM;  Location[facility];  Service: Podiatry;  Laterality: Right;  Block done by surgeon  . INTRAVASCULAR ULTRASOUND/IVUS N/A 06/22/2020   Procedure: Intravascular Ultrasound/IVUS;  Surgeon: Harding, David W, MD;  [facility];  Service: Cardiovascular;  Laterality: N/A;  . LEFT HEART CATH AND CORS/GRAFTS ANGIOGRAPHY N/A 05/23/2020   Procedure: LEFT HEART CATH AND CORS/GRAFTS ANGIOGRAPHY;  Surgeon: End, Christopher, MD;  [facility];  Service: Cardiovascular;  Laterality: N/A;  . METATARSAL HEAD EXCISION Right   05/02/2020   Procedure: FIRST METATARSAL HEAD RESECTION; RIGHT FOOT WOUND CLOSURE;  Surgeon: Stover, Titorya, DPM;  Location: [facility];  Service: Podiatry;  Laterality: Right;  MAC W/LOCAL  . TUBAL LIGATION       Home Medications:  Prior to Admission medications   Medication Sig Start Date End Date Taking? Authorizing Provider  acetaminophen (TYLENOL) 500 MG tablet Take  2 tablets (1,000 mg total) by mouth every 6 (six) hours as needed. Patient taking differently: Take 1,000 mg by mouth every 6 (six) hours as needed for moderate pain.  05/02/20 05/02/21  Stover, Titorya, DPM  aspirin EC 81 MG tablet Take 1 tablet (81 mg total) by mouth daily. 05/13/20   Munley, Brian J, MD  clopidogrel (PLAVIX) 75 MG tablet Take 1 tablet (75 mg total) by mouth daily. Patient taking differently: Take 75 mg by mouth at bedtime. 03/11/20   Munley, Brian J, MD  Cyanocobalamin (VITAMIN B 12 PO) Take 1 tablet by mouth daily.    [provider]  docusate sodium (COLACE) 100 MG capsule Take 1 capsule (100 mg total) by mouth daily as needed. 05/02/20 05/02/21  Stover, Titorya, DPM  furosemide (LASIX) 40 MG tablet Take 1 tablet by mouth once daily Patient taking differently: Take 40 mg by mouth daily.  02/15/20   Munley, Brian J, MD  gabapentin (NEURONTIN) 300 MG capsule Take 1 capsule (300 mg total) by mouth at bedtime. 08/10/20   Stover, Titorya, DPM  glipiZIDE (GLUCOTROL) 10 MG tablet Take 10 mg by mouth 2 (two) times daily.    [provider]  icosapent Ethyl (VASCEPA) 1 g capsule Take 2 capsules (2 g total) by mouth 2 (two) times daily. 05/26/20   Furth, Cadence H, PA-C  Insulin Glargine (BASAGLAR KWIKPEN) 100 UNIT/ML Inject 0.2 mLs (20 Units total) into the skin at bedtime. Patient taking differently: Inject 15 Units into the skin daily. 02/21/20   Ghimire, Kuber, MD  JANUVIA 100 MG tablet Take 100 mg by mouth daily. 02/05/20   [provider]  lisinopril (ZESTRIL) 20 MG tablet Take 1 tablet (20 mg total) by mouth daily. 08/30/20   Munley, Brian J, MD  LORazepam (ATIVAN) 1 MG tablet Take 1 mg by mouth 2 (two) times daily as needed for anxiety.     [provider]  metFORMIN (GLUCOPHAGE-XR) 500 MG 24 hr tablet Take 1,000 mg by mouth 2 (two) times daily. 02/15/20   [provider]  metoprolol succinate (TOPROL-XL) 50 MG 24 hr tablet Take 150 mg by mouth  daily.  10/18/16   [provider]  Miconazole Nitrate 2 % AERO Spray to left foot at bedtime for dry scaly skin and itchiness 08/10/20   Stover, Titorya, DPM  nitroGLYCERIN (NITROSTAT) 0.4 MG SL tablet Place 1 tablet (0.4 mg total) under the tongue every 5 (five) minutes x 3 doses as needed for chest pain. 07/20/19   Munley, Brian J, MD  pantoprazole (PROTONIX) 40 MG tablet Take 1 tablet (40 mg total) by mouth daily. 09/08/20   Munley, Brian J, MD  pravastatin (PRAVACHOL) 20 MG tablet Take 20 mg by mouth daily. 08/02/20   [provider]  vitamin C (ASCORBIC ACID) 250 MG tablet Take 250 mg by mouth daily.    [provider]  Vitamin D, Ergocalciferol, (DRISDOL) 50000 units CAPS capsule Take 50,000 Units by mouth every 7 (seven) days. Saturdays    [provider]    Inpatient Medications: Scheduled Meds: . aspirin EC    81 mg Oral Daily  . Chlorhexidine Gluconate Cloth  6 each Topical Daily  . clopidogrel  75 mg Oral QHS  . heparin  5,000 Units Subcutaneous Q8H  . icosapent Ethyl  2 g Oral BID  . insulin aspart  0-15 Units Subcutaneous TID AC & HS  . insulin glargine  15 Units Subcutaneous Daily  . lisinopril  20 mg Oral Daily  . metoprolol succinate  150 mg Oral Daily  . pantoprazole  40 mg Oral Daily  . pravastatin  20 mg Oral Daily  . sodium chloride flush  3 mL Intravenous Q12H   Continuous Infusions: . sodium chloride 250 mL (12/18/20 1123)  . amiodarone 30 mg/hr (12/18/20 1121)   PRN Meds: sodium chloride, acetaminophen, docusate sodium, nitroGLYCERIN, sodium chloride flush  Allergies:    Allergies  Allergen Reactions  . Tramadol     Hallucinations  . Codeine Rash    Social History:   Social History   Socioeconomic History  . Marital status: Married    Spouse name: Not on file  . Number of children: Not on file  . Years of education: Not on file  . Highest education level: Not on file  Occupational History  . Not on file  Tobacco Use   . Smoking status: Former Smoker    Quit date: 05/2016    Years since quitting: 4.5  . Smokeless tobacco: Never Used  Vaping Use  . Vaping Use: Never used  Substance and Sexual Activity  . Alcohol use: No  . Drug use: No  . Sexual activity: Yes  Other Topics Concern  . Not on file  Social History Narrative  . Not on file   Social Determinants of Health   Financial Resource Strain: Not on file  Food Insecurity: Not on file  Transportation Needs: Not on file  Physical Activity: Not on file  Stress: Not on file  Social Connections: Not on file  Intimate Partner Violence: Not on file    Family History:    Family History  Problem Relation Age of Onset  . Hypertension Mother   . Hyperlipidemia Mother   . Heart attack Father   . Heart disease Father   . Hypertension Father   . Alzheimer's disease Father   . Hypertension Brother   . Hyperlipidemia Brother      ROS:  Please see the history of present illness.   All other ROS reviewed and negative.     Physical Exam/Data:   Vitals:   12/18/20 0929 12/18/20 0930 12/18/20 1115 12/18/20 1143  BP: (!) 145/79 133/78 129/75   Pulse:  90 67   Resp:  18 19   Temp:    98.2 F (36.8 C)  TempSrc:      SpO2:  93% 95%   Weight:        Intake/Output Summary (Last 24 hours) at 12/18/2020 1153 Last data filed at 12/18/2020 0700 Gross per 24 hour  Intake 1986.84 ml  Output 750 ml  Net 1236.84 ml   Last 3 Weights 12/18/2020 12/17/2020 08/08/2020  Weight (lbs) 233 lb 14.5 oz 233 lb 14.5 oz 233 lb  Weight (kg) 106.1 kg 106.1 kg 105.688 kg     Body mass index is 40.15 kg/m.  General:  Well nourished, well developed, in no acute distress HEENT: normal Lymph: no adenopathy Neck: no JVD Endocrine:  No thryomegaly Vascular: No carotid bruits; FA pulses 2+ bilaterally without bruits  Cardiac:  normal S1, S2; RRR; no murmur    Lungs:  clear to auscultation bilaterally, no wheezing, rhonchi or rales  Abd: soft, nontender, no  hepatomegaly  Ext: no edema Musculoskeletal:  No deformities, BUE and BLE strength normal and equal Skin: warm and dry  Neuro:  CNs 2-12 intact, no focal abnormalities noted Psych:  Normal affect   EKG:  The EKG was personally reviewed and demonstrates:  nsr Telemetry:  Telemetry was personally reviewed and demonstrates:  nsr  Relevant CV Studies: none  Laboratory Data:  High Sensitivity Troponin:   Recent Labs  Lab 12/17/20 2034 12/18/20 0110  TROPONINIHS 155* 2,286*     Chemistry Recent Labs  Lab 12/17/20 2034 12/18/20 0110  NA 132* 134*  K 4.4 4.3  CL 98 99  CO2 19* 23  GLUCOSE 411* 369*  BUN 19 15  CREATININE 1.08* 1.07*  CALCIUM 9.4 9.0  GFRNONAA 59* 59*  ANIONGAP 15 12    No results for input(s): PROT, ALBUMIN, AST, ALT, ALKPHOS, BILITOT in the last 168 hours. Hematology Recent Labs  Lab 12/17/20 2034 12/18/20 0355  WBC 13.3* 8.8  RBC 4.33 3.82*  HGB 12.2 11.2*  HCT 36.7 31.9*  MCV 84.8 83.5  MCH 28.2 29.3  MCHC 33.2 35.1  RDW 13.0 13.1  PLT 347 254   BNP Recent Labs  Lab 12/18/20 0110  BNP 113.2*    DDimer No results for input(s): DDIMER in the last 168 hours.   Radiology/Studies:  No results found.   Assessment and Plan:   1. VT - I have discussed the treatment options with the patient and recommended she undergo left heart cath and if no culprit, ICD insertion. She will continue amiodarone for now. 2. ICM - she is s/p MI and CABG. Her EF previously was 50%. She denies missing any meds. 3. Obesity - she will need to lose weight.    For questions or updates, please contact [facility] Please consult [email or web address] for contact info under    Signed, Ahmere Hemenway, MD  12/18/2020 11:53 AM 

## 2020-12-18 NOTE — Consult Note (Signed)
Cardiology Consultation:   [name] MRN: [id number]; DOB: Mar 27, 1961  Admit date: 12/17/2020 Date of Consult: 12/18/2020  Primary Care Provider: Ernestene Kiel, MD [facility] Cardiologist: Shirlee More, MD  [place] Electrophysiologist:  None    Patient Profile:   Jasmine Richardson is a 60 y.o. female with a hx of CAD, s/p CABG who is being seen today for the evaluation of VT at the request of Dr. Vickki Muff  History of Present Illness:   Ms. Jasmine Richardson has a long h/o CAD,s/p CABG 5784 complicted by mediastinitis, s/p myocutaneous flaps, HTN, recent PIC of the LM in July 21. She was in her usual state of health when she developed sob and chest pressure and presented with a RBBB wide QRS tachycardia which was poorly tolerated. She was treated with IV amio and lidocaine but was then cardioverted while awake back to NSR. She denies recent anginal symptoms. No change in meds. Her troponin is 2K.    Past Medical History:  Diagnosis Date  . Acute chest pain 05/08/2016  . Angina pectoris ([place]) 09/11/2017  . [place] 05/08/2016  . Chronic diastolic heart failure ([place]) 11/06/2016  . Coronary artery disease involving native coronary artery of native heart with angina pectoris ([place]) 09/12/2015   Overview:  PCI and stent of RCA 2009, last cath 2012 with medical therapy  CABG May 2017  . Emphysema lung ([place]) 09/11/2017  . Essential hypertension 09/12/2015  . GERD (gastroesophageal reflux disease) 05/08/2016  . Hyperlipidemia 09/12/2015  . Mediastinitis 06/28/2016  . Morbid obesity ([place]) 05/08/2016  . S/P CABG (coronary artery bypass graft) 06/03/2016   Overview:  The patient underwent sternal reconstruction on 06/21/16 with pec flaps for mediastinitis from a prior CABG in May 2017. On admission, she was critically ill from sepsis and had altered mental status. She was last seen in clinic on 08/09/16 at which time she was doing well.  . Severe sepsis ([place]) 06/03/2016  . Sinus tachycardia  05/08/2016  . Tobacco use disorder 04/20/2016   Overview:  Quit in May 2017.  [name] Uncontrolled type 2 diabetes mellitus ([place]) 05/08/2016  . Wound, surgical, infected 06/06/2016   Overview:  sternal    Past Surgical History:  Procedure Laterality Date  . AMPUTATION TOE Right 02/20/2020   Procedure: AMPUTATION RIGHT GREAT  TOE;  Surgeon: Felipa Furnace, DPM;  Location: [place];  Service: Podiatry;  Laterality: Right;  . BLADDER SURGERY    . CARDIAC CATHETERIZATION    . CORONARY ARTERY BYPASS GRAFT    . CORONARY STENT INTERVENTION N/A 05/25/2020   Procedure: CORONARY STENT INTERVENTION;  Surgeon: Wellington Hampshire, MD;  Location: [place] [facility];  Service: Cardiovascular;  Laterality: N/A;  . CORONARY STENT INTERVENTION N/A 06/22/2020   Procedure: CORONARY STENT INTERVENTION;  Surgeon: Leonie Man, MD;  Location: [place] [facility];  Service: Cardiovascular;  Laterality: N/A;  . INCISION AND DRAINAGE Right 02/19/2020   Procedure: INCISION AND DRAINAGE;  Surgeon: Trula Slade, DPM;  Location: [place];  Service: Podiatry;  Laterality: Right;  Block done by surgeon  . INTRAVASCULAR ULTRASOUND/IVUS N/A 06/22/2020   Procedure: Intravascular Ultrasound/IVUS;  Surgeon: Leonie Man, MD;  Location: [place] [facility];  Service: Cardiovascular;  Laterality: N/A;  . LEFT HEART CATH AND CORS/GRAFTS ANGIOGRAPHY N/A 05/23/2020   Procedure: LEFT HEART CATH AND CORS/GRAFTS ANGIOGRAPHY;  Surgeon: Nelva Bush, MD;  Location: [place] [facility];  Service: Cardiovascular;  Laterality: N/A;  . METATARSAL HEAD EXCISION Right  05/02/2020   Procedure: FIRST METATARSAL HEAD RESECTION; RIGHT FOOT WOUND CLOSURE;  Surgeon: Landis Martins, DPM;  Location: [place];  Service: Podiatry;  Laterality: Right;  MAC W/LOCAL  . TUBAL LIGATION       Home Medications:  Prior to Admission medications   Medication Sig Start Date End Date Taking? Authorizing Provider  acetaminophen (TYLENOL) 500 MG tablet Take  2 tablets (1,000 mg total) by mouth every 6 (six) hours as needed. Patient taking differently: Take 1,000 mg by mouth every 6 (six) hours as needed for moderate pain.  05/02/20 05/02/21  Landis Martins, DPM  aspirin EC 81 MG tablet Take 1 tablet (81 mg total) by mouth daily. 05/13/20   Richardo Priest, MD  clopidogrel (PLAVIX) 75 MG tablet Take 1 tablet (75 mg total) by mouth daily. Patient taking differently: Take 75 mg by mouth at bedtime. 03/11/20   Richardo Priest, MD  Cyanocobalamin (VITAMIN B 12 PO) Take 1 tablet by mouth daily.    [provider]  docusate sodium (COLACE) 100 MG capsule Take 1 capsule (100 mg total) by mouth daily as needed. 05/02/20 05/02/21  Landis Martins, DPM  furosemide (LASIX) 40 MG tablet Take 1 tablet by mouth once daily Patient taking differently: Take 40 mg by mouth daily.  02/15/20   Richardo Priest, MD  gabapentin (NEURONTIN) 300 MG capsule Take 1 capsule (300 mg total) by mouth at bedtime. 08/10/20   Stover, Titorya, DPM  glipiZIDE (GLUCOTROL) 10 MG tablet Take 10 mg by mouth 2 (two) times daily.    [provider]  icosapent Ethyl (VASCEPA) 1 g capsule Take 2 capsules (2 g total) by mouth 2 (two) times daily. 05/26/20   Furth, Cadence H, PA-C  Insulin Glargine (BASAGLAR KWIKPEN) 100 UNIT/ML Inject 0.2 mLs (20 Units total) into the skin at bedtime. Patient taking differently: Inject 15 Units into the skin daily. 02/21/20   Barb Merino, MD  JANUVIA 100 MG tablet Take 100 mg by mouth daily. 02/05/20   [provider]  lisinopril (ZESTRIL) 20 MG tablet Take 1 tablet (20 mg total) by mouth daily. 08/30/20   Richardo Priest, MD  LORazepam (ATIVAN) 1 MG tablet Take 1 mg by mouth 2 (two) times daily as needed for anxiety.     [provider]  metFORMIN (GLUCOPHAGE-XR) 500 MG 24 hr tablet Take 1,000 mg by mouth 2 (two) times daily. 02/15/20   [provider]  metoprolol succinate (TOPROL-XL) 50 MG 24 hr tablet Take 150 mg by mouth  daily.  10/18/16   [provider]  Miconazole Nitrate 2 % AERO Spray to left foot at bedtime for dry scaly skin and itchiness 08/10/20   Landis Martins, DPM  nitroGLYCERIN (NITROSTAT) 0.4 MG SL tablet Place 1 tablet (0.4 mg total) under the tongue every 5 (five) minutes x 3 doses as needed for chest pain. 07/20/19   Richardo Priest, MD  pantoprazole (PROTONIX) 40 MG tablet Take 1 tablet (40 mg total) by mouth daily. 09/08/20   Richardo Priest, MD  pravastatin (PRAVACHOL) 20 MG tablet Take 20 mg by mouth daily. 08/02/20   [provider]  vitamin C (ASCORBIC ACID) 250 MG tablet Take 250 mg by mouth daily.    [provider]  Vitamin D, Ergocalciferol, (DRISDOL) 50000 units CAPS capsule Take 50,000 Units by mouth every 7 (seven) days. Saturdays    [provider]    Inpatient Medications: Scheduled Meds: . aspirin EC  81 mg Oral Daily  . Chlorhexidine Gluconate Cloth  6 each Topical Daily  . clopidogrel  75 mg Oral QHS  . heparin  5,000 Units Subcutaneous Q8H  . icosapent Ethyl  2 g Oral BID  . insulin aspart  0-15 Units Subcutaneous TID AC & HS  . insulin glargine  15 Units Subcutaneous Daily  . lisinopril  20 mg Oral Daily  . metoprolol succinate  150 mg Oral Daily  . pantoprazole  40 mg Oral Daily  . pravastatin  20 mg Oral Daily  . sodium chloride flush  3 mL Intravenous Q12H   Continuous Infusions: . sodium chloride 250 mL (12/18/20 1123)  . amiodarone 30 mg/hr (12/18/20 1121)   PRN Meds: sodium chloride, acetaminophen, docusate sodium, nitroGLYCERIN, sodium chloride flush  Allergies:    Allergies  Allergen Reactions  . Tramadol     Hallucinations  . Codeine Rash    Social History:   Social History   Socioeconomic History  . Marital status: Married    Spouse name: Not on file  . Number of children: Not on file  . Years of education: Not on file  . Highest education level: Not on file  Occupational History  . Not on file  Tobacco Use   . Smoking status: Former Smoker    Quit date: 05/2016    Years since quitting: 4.5  . Smokeless tobacco: Never Used  Vaping Use  . Vaping Use: Never used  Substance and Sexual Activity  . Alcohol use: No  . Drug use: No  . Sexual activity: Yes  Other Topics Concern  . Not on file  Social History Narrative  . Not on file   Social Determinants of Health   Financial Resource Strain: Not on file  Food Insecurity: Not on file  Transportation Needs: Not on file  Physical Activity: Not on file  Stress: Not on file  Social Connections: Not on file  Intimate Partner Violence: Not on file    Family History:    Family History  Problem Relation Age of Onset  . Hypertension Mother   . Hyperlipidemia Mother   . Heart attack Father   . Heart disease Father   . Hypertension Father   . Alzheimer's disease Father   . Hypertension Brother   . Hyperlipidemia Brother      ROS:  Please see the history of present illness.   All other ROS reviewed and negative.     Physical Exam/Data:   Vitals:   12/18/20 0929 12/18/20 0930 12/18/20 1115 12/18/20 1143  BP: (!) 145/79 133/78 129/75   Pulse:  90 67   Resp:  18 19   Temp:    98.2 F (36.8 C)  TempSrc:      SpO2:  93% 95%   Weight:        Intake/Output Summary (Last 24 hours) at 12/18/2020 1153 Last data filed at 12/18/2020 0700 Gross per 24 hour  Intake 1986.84 ml  Output 750 ml  Net 1236.84 ml   Last 3 Weights 12/18/2020 12/17/2020 08/08/2020  Weight (lbs) 233 lb 14.5 oz 233 lb 14.5 oz 233 lb  Weight (kg) 106.1 kg 106.1 kg 105.688 kg     Body mass index is 40.15 kg/m.  General:  Well nourished, well developed, in no acute distress HEENT: normal Lymph: no adenopathy Neck: no JVD Endocrine:  No thryomegaly Vascular: No carotid bruits; FA pulses 2+ bilaterally without bruits  Cardiac:  normal S1, S2; RRR; no murmur  Lungs:  clear to auscultation bilaterally, no wheezing, rhonchi or rales  Abd: soft, nontender, no  hepatomegaly  Ext: no edema Musculoskeletal:  No deformities, BUE and BLE strength normal and equal Skin: warm and dry  Neuro:  CNs 2-12 intact, no focal abnormalities noted Psych:  Normal affect   EKG:  The EKG was personally reviewed and demonstrates:  nsr Telemetry:  Telemetry was personally reviewed and demonstrates:  nsr  Relevant CV Studies: none  Laboratory Data:  High Sensitivity Troponin:   Recent Labs  Lab 12/17/20 2034 12/18/20 0110  TROPONINIHS 155* 2,286*     Chemistry Recent Labs  Lab 12/17/20 2034 12/18/20 0110  NA 132* 134*  K 4.4 4.3  CL 98 99  CO2 19* 23  GLUCOSE 411* 369*  BUN 19 15  CREATININE 1.08* 1.07*  CALCIUM 9.4 9.0  GFRNONAA 59* 59*  ANIONGAP 15 12    No results for input(s): PROT, ALBUMIN, AST, ALT, ALKPHOS, BILITOT in the last 168 hours. Hematology Recent Labs  Lab 12/17/20 2034 12/18/20 0355  WBC 13.3* 8.8  RBC 4.33 3.82*  HGB 12.2 11.2*  HCT 36.7 31.9*  MCV 84.8 83.5  MCH 28.2 29.3  MCHC 33.2 35.1  RDW 13.0 13.1  PLT 347 254   BNP Recent Labs  Lab 12/18/20 0110  BNP 113.2*    DDimer No results for input(s): DDIMER in the last 168 hours.   Radiology/Studies:  No results found.   Assessment and Plan:   1. VT - I have discussed the treatment options with the patient and recommended she undergo left heart cath and if no culprit, ICD insertion. She will continue amiodarone for now. 2. ICM - she is s/p MI and CABG. Her EF previously was 50%. She denies missing any meds. 3. Obesity - she will need to lose weight.    For questions or updates, please contact [place] Please consult [email or web address] for contact info under    Signed, Cristopher Peru, MD  12/18/2020 11:53 AM

## 2020-12-19 ENCOUNTER — Encounter (HOSPITAL_COMMUNITY)
Admission: EM | Disposition: A | Discharge status: Home / Self Care | Payer: Self-pay | Source: Home / Self Care | Attending: Internal Medicine

## 2020-12-19 ENCOUNTER — Encounter (HOSPITAL_COMMUNITY): Payer: Self-pay | Admitting: Cardiovascular Disease

## 2020-12-19 DIAGNOSIS — I2510 Atherosclerotic heart disease of native coronary artery without angina pectoris: Secondary | ICD-10-CM

## 2020-12-19 DIAGNOSIS — I25810 Atherosclerosis of coronary artery bypass graft(s) without angina pectoris: Secondary | ICD-10-CM

## 2020-12-19 DIAGNOSIS — I472 Ventricular tachycardia: Secondary | ICD-10-CM

## 2020-12-19 DIAGNOSIS — Z951 Presence of aortocoronary bypass graft: Secondary | ICD-10-CM

## 2020-12-19 DIAGNOSIS — I255 Ischemic cardiomyopathy: Secondary | ICD-10-CM

## 2020-12-19 DIAGNOSIS — I214 Non-ST elevation (NSTEMI) myocardial infarction: Secondary | ICD-10-CM

## 2020-12-19 HISTORY — PX: LEFT HEART CATH AND CORS/GRAFTS ANGIOGRAPHY: CATH118250

## 2020-12-19 HISTORY — PX: ICD IMPLANT: EP1208

## 2020-12-19 LAB — MAGNESIUM: Magnesium: 1.9 mg/dL (ref 1.7–2.4)

## 2020-12-19 LAB — CBC
HCT: 32.2 % — ABNORMAL LOW (ref 36.0–46.0)
Hemoglobin: 11.2 g/dL — ABNORMAL LOW (ref 12.0–15.0)
MCH: 29.2 pg (ref 26.0–34.0)
MCHC: 34.8 g/dL (ref 30.0–36.0)
MCV: 84.1 fL (ref 80.0–100.0)
Platelets: 253 10*3/uL (ref 150–400)
RBC: 3.83 MIL/uL — ABNORMAL LOW (ref 3.87–5.11)
RDW: 13.1 % (ref 11.5–15.5)
WBC: 7.5 10*3/uL (ref 4.0–10.5)
nRBC: 0 % (ref 0.0–0.2)

## 2020-12-19 LAB — BASIC METABOLIC PANEL
Anion gap: 11 (ref 5–15)
BUN: 11 mg/dL (ref 6–20)
CO2: 21 mmol/L — ABNORMAL LOW (ref 22–32)
Calcium: 8.7 mg/dL — ABNORMAL LOW (ref 8.9–10.3)
Chloride: 102 mmol/L (ref 98–111)
Creatinine, Ser: 0.87 mg/dL (ref 0.44–1.00)
GFR, Estimated: >60 mL/min (ref >60)
Glucose, Bld: 258 mg/dL — ABNORMAL HIGH (ref 70–99)
Potassium: 4.1 mmol/L (ref 3.5–5.1)
Sodium: 134 mmol/L — ABNORMAL LOW (ref 135–145)

## 2020-12-19 LAB — GLUCOSE, CAPILLARY
Glucose-Capillary: 299 mg/dL — ABNORMAL HIGH (ref 70–99)
Glucose-Capillary: 262 mg/dL — ABNORMAL HIGH (ref 70–99)
Glucose-Capillary: 237 mg/dL — ABNORMAL HIGH (ref 70–99)
Glucose-Capillary: 244 mg/dL — ABNORMAL HIGH (ref 70–99)

## 2020-12-19 SURGERY — LEFT HEART CATH AND CORS/GRAFTS ANGIOGRAPHY
Anesthesia: LOCAL

## 2020-12-19 SURGERY — ICD IMPLANT
Anesthesia: LOCAL

## 2020-12-19 MED ORDER — MAGNESIUM SULFATE 2 GM/50ML IV SOLN
2.0000 g | Freq: Once | INTRAVENOUS | Status: AC
  Administered 2020-12-19: 2 g via INTRAVENOUS
  Filled 2020-12-19: qty 50

## 2020-12-19 MED ORDER — SODIUM CHLORIDE 0.9% FLUSH: 3.0000 mL | Freq: Two times a day (BID) | INTRAVENOUS | Status: DC

## 2020-12-19 MED ORDER — LIDOCAINE HCL (PF) 1 % IJ SOLN
INTRAMUSCULAR | Status: AC
  Filled 2020-12-19: qty 30

## 2020-12-19 MED ORDER — HEPARIN SODIUM (PORCINE) 5000 UNIT/ML IJ SOLN
5000.0000 [IU] | Freq: Three times a day (TID) | INTRAMUSCULAR | Status: DC
  Administered 2020-12-20: 5000 [IU] via SUBCUTANEOUS
  Filled 2020-12-19: qty 1

## 2020-12-19 MED ORDER — CEFAZOLIN SODIUM-DEXTROSE 1-4 GM/50ML-% IV SOLN
1.0000 g | Freq: Four times a day (QID) | INTRAVENOUS | Status: AC
  Administered 2020-12-19 – 2020-12-20 (×3): 1 g via INTRAVENOUS
  Filled 2020-12-19 (×4): qty 50

## 2020-12-19 MED ORDER — HEPARIN (PORCINE) IN NACL 1000-0.9 UT/500ML-% IV SOLN
INTRAVENOUS | Status: DC | PRN
  Administered 2020-12-19 (×2): 500 mL

## 2020-12-19 MED ORDER — LIDOCAINE HCL (PF) 1 % IJ SOLN
INTRAMUSCULAR | Status: DC | PRN
  Administered 2020-12-19: 15 mL

## 2020-12-19 MED ORDER — LIVING WELL WITH DIABETES BOOK
Freq: Once | Status: AC
  Filled 2020-12-19: qty 1

## 2020-12-19 MED ORDER — SODIUM CHLORIDE 0.9 % IV SOLN
INTRAVENOUS | Status: AC
  Filled 2020-12-19: qty 2

## 2020-12-19 MED ORDER — ACETAMINOPHEN 325 MG PO TABS
325.0000 mg | ORAL_TABLET | ORAL | Status: DC | PRN
Start: 2020-12-19 — End: 2020-12-20
  Administered 2020-12-19 – 2020-12-20 (×2): 650 mg via ORAL
  Filled 2020-12-19 (×2): qty 2

## 2020-12-19 MED ORDER — FENTANYL CITRATE (PF) 100 MCG/2ML IJ SOLN
INTRAMUSCULAR | Status: AC
  Filled 2020-12-19: qty 2

## 2020-12-19 MED ORDER — SODIUM CHLORIDE 0.9% FLUSH: 3.0000 mL | INTRAVENOUS | Status: DC | PRN

## 2020-12-19 MED ORDER — HYDRALAZINE HCL 20 MG/ML IJ SOLN: 10.0000 mg | INTRAMUSCULAR | Status: AC | PRN

## 2020-12-19 MED ORDER — INSULIN ASPART 100 UNIT/ML ~~LOC~~ SOLN
0.0000 [IU] | Freq: Every day | SUBCUTANEOUS | Status: DC
  Administered 2020-12-19: 2 [IU] via SUBCUTANEOUS

## 2020-12-19 MED ORDER — CEFAZOLIN SODIUM-DEXTROSE 2-4 GM/100ML-% IV SOLN
INTRAVENOUS | Status: AC
  Filled 2020-12-19: qty 100

## 2020-12-19 MED ORDER — HEPARIN (PORCINE) IN NACL 1000-0.9 UT/500ML-% IV SOLN
INTRAVENOUS | Status: AC
  Filled 2020-12-19: qty 500

## 2020-12-19 MED ORDER — MIDAZOLAM HCL 5 MG/5ML IJ SOLN
INTRAMUSCULAR | Status: AC
  Filled 2020-12-19: qty 5

## 2020-12-19 MED ORDER — INSULIN ASPART 100 UNIT/ML ~~LOC~~ SOLN
0.0000 [IU] | Freq: Three times a day (TID) | SUBCUTANEOUS | Status: DC
  Administered 2020-12-19: 7 [IU] via SUBCUTANEOUS
  Administered 2020-12-19: 11 [IU] via SUBCUTANEOUS
  Administered 2020-12-20: 15 [IU] via SUBCUTANEOUS

## 2020-12-19 MED ORDER — LIDOCAINE HCL 1 % IJ SOLN
INTRAMUSCULAR | Status: AC
  Filled 2020-12-19: qty 60

## 2020-12-19 MED ORDER — FENTANYL CITRATE (PF) 100 MCG/2ML IJ SOLN
INTRAMUSCULAR | Status: DC | PRN
  Administered 2020-12-19 (×6): 12.5 ug via INTRAVENOUS

## 2020-12-19 MED ORDER — MIDAZOLAM HCL 5 MG/5ML IJ SOLN
INTRAMUSCULAR | Status: DC | PRN
  Administered 2020-12-19 (×6): 1 mg via INTRAVENOUS

## 2020-12-19 MED ORDER — SODIUM CHLORIDE 0.9 % IV SOLN: INTRAVENOUS | Status: AC

## 2020-12-19 MED ORDER — MIDAZOLAM HCL 2 MG/2ML IJ SOLN
INTRAMUSCULAR | Status: AC
  Filled 2020-12-19: qty 2

## 2020-12-19 MED ORDER — ENSURE ENLIVE PO LIQD
237.0000 mL | Freq: Two times a day (BID) | ORAL | Status: DC
  Administered 2020-12-19: 237 mL via ORAL

## 2020-12-19 MED ORDER — HEPARIN (PORCINE) IN NACL 1000-0.9 UT/500ML-% IV SOLN
INTRAVENOUS | Status: DC | PRN
  Administered 2020-12-19: 500 mL

## 2020-12-19 MED ORDER — LIDOCAINE HCL (PF) 1 % IJ SOLN
INTRAMUSCULAR | Status: DC | PRN
  Administered 2020-12-19: 45 mL

## 2020-12-19 MED ORDER — LABETALOL HCL 5 MG/ML IV SOLN: 10.0000 mg | INTRAVENOUS | Status: AC | PRN

## 2020-12-19 MED ORDER — FENTANYL CITRATE (PF) 100 MCG/2ML IJ SOLN
INTRAMUSCULAR | Status: DC | PRN
  Administered 2020-12-19 (×2): 25 ug via INTRAVENOUS

## 2020-12-19 MED ORDER — IOHEXOL 350 MG/ML SOLN
INTRAVENOUS | Status: DC | PRN
  Administered 2020-12-19: 50 mL

## 2020-12-19 MED ORDER — SODIUM CHLORIDE 0.9 % IV SOLN: 250.0000 mL | INTRAVENOUS | Status: DC | PRN

## 2020-12-19 MED ORDER — MIDAZOLAM HCL 2 MG/2ML IJ SOLN
INTRAMUSCULAR | Status: DC | PRN
  Administered 2020-12-19 (×2): 1 mg via INTRAVENOUS

## 2020-12-19 MED ORDER — ONDANSETRON HCL 4 MG/2ML IJ SOLN: 4.0000 mg | Freq: Four times a day (QID) | INTRAMUSCULAR | Status: DC | PRN

## 2020-12-19 SURGICAL SUPPLY — 12 items
CATH INFINITI 5 FR IM (CATHETERS) ×1 IMPLANT
CATH INFINITI 5FR MULTPACK ANG (CATHETERS) ×1 IMPLANT
CLOSURE MYNX CONTROL 5F (Vascular Products) ×1 IMPLANT
ELECT DEFIB PAD ADLT CADENCE (PAD) ×1 IMPLANT
KIT HEART LEFT (KITS) ×2 IMPLANT
KIT MICROPUNCTURE NIT STIFF (SHEATH) ×1 IMPLANT
PACK CARDIAC CATHETERIZATION (CUSTOM PROCEDURE TRAY) ×2 IMPLANT
SHEATH PINNACLE 5F 10CM (SHEATH) ×1 IMPLANT
TRANSDUCER W/STOPCOCK (MISCELLANEOUS) ×2 IMPLANT
TUBING CIL FLEX 10 FLL-RA (TUBING) ×2 IMPLANT
WIRE EMERALD 3MM-J .035X150CM (WIRE) ×1 IMPLANT
WIRE EMERALD 3MM-J .035X260CM (WIRE) ×1 IMPLANT

## 2020-12-19 SURGICAL SUPPLY — 9 items
CABLE SURGICAL S-101-97-12 (CABLE) ×2 IMPLANT
ICD GALLANT DR CDDRA500Q (ICD Generator) ×1 IMPLANT
LEAD DURATA 7122Q-65CM (Lead) ×1 IMPLANT
LEAD TENDRIL MRI 52CM LPA1200M (Lead) ×1 IMPLANT
MAT PREVALON FULL STRYKER (MISCELLANEOUS) ×1 IMPLANT
PAD PRO RADIOLUCENT 2001M-C (PAD) ×2 IMPLANT
SHEATH 7FR PRELUDE SNAP 13 (SHEATH) ×1 IMPLANT
SHEATH 8FR PRELUDE SNAP 13 (SHEATH) ×1 IMPLANT
TRAY PACEMAKER INSERTION (PACKS) ×2 IMPLANT

## 2020-12-19 NOTE — Interval H&P Note (Signed)
History and Physical Interval Note:  12/19/2020 11:45 AM  Jasmine Richardson  has presented today for surgery, with the diagnosis of vt.  The various methods of treatment have been discussed with the patient and family. After consideration of risks, benefits and other options for treatment, the patient has consented to  Procedure(s): ICD IMPLANT (N/A) as a surgical intervention.  The patient's history has been reviewed, patient examined, no change in status, stable for surgery.  I have reviewed the patient's chart and labs.  Questions were answered to the patient's satisfaction.     Lewayne Bunting

## 2020-12-19 NOTE — Interval H&P Note (Signed)
History and Physical Interval Note:  12/19/2020 9:46 AM  Jasmine Richardson  has presented today for surgery, with the diagnosis of NSTEMI.  The various methods of treatment have been discussed with the patient and family. After consideration of risks, benefits and other options for treatment, the patient has consented to  Procedure(s): LEFT HEART CATH AND CORS/GRAFTS ANGIOGRAPHY (N/A) as a surgical intervention.  The patient's history has been reviewed, patient examined, no change in status, stable for surgery.  I have reviewed the patient's chart and labs.  Questions were answered to the patient's satisfaction.    Cath Lab Visit (complete for each Cath Lab visit)  Clinical Evaluation Leading to the Procedure:   ACS: Yes.    Non-ACS:    Anginal Classification: CCS III  Anti-ischemic medical therapy: Minimal Therapy (1 class of medications)  Non-Invasive Test Results: No non-invasive testing performed  Prior CABG: Previous CABG       Verne Carrow

## 2020-12-19 NOTE — Progress Notes (Addendum)
Progress Note  Patient Name: Jasmine Richardson Date of Encounter: 12/19/2020  [facility] Cardiologist: Norman Herrlich, MD   Subjective   No chest pain or shortness of breath  Inpatient Medications    Scheduled Meds: . aspirin EC  81 mg Oral Daily  . clopidogrel  75 mg Oral QHS  . gentamicin irrigation  80 mg Irrigation To Cath  . heparin  5,000 Units Subcutaneous Q8H  . icosapent Ethyl  2 g Oral BID  . insulin aspart  0-15 Units Subcutaneous TID AC & HS  . insulin glargine  15 Units Subcutaneous Daily  . lisinopril  20 mg Oral Daily  . metoprolol succinate  150 mg Oral Daily  . pantoprazole  40 mg Oral Daily  . pravastatin  20 mg Oral Daily  . sodium chloride flush  3 mL Intravenous Q12H  . sodium chloride flush  3 mL Intravenous Q12H   Continuous Infusions: . sodium chloride 106 mL/hr at 12/19/20 0600  . sodium chloride    . sodium chloride 50 mL/hr at 12/19/20 0605  . sodium chloride 1 mL/kg/hr (12/19/20 0501)  . amiodarone 30 mg/hr (12/19/20 0600)  .  ceFAZolin (ANCEF) IV     PRN Meds: sodium chloride, sodium chloride, acetaminophen, docusate sodium, nitroGLYCERIN, sodium chloride flush, sodium chloride flush   Vital Signs    Vitals:   12/19/20 0500 12/19/20 0600 12/19/20 0700 12/19/20 0803  BP: 120/60 128/63 124/68   Pulse: 72 75 72   Resp: 20 15 20    Temp:    98.1 F (36.7 C)  TempSrc:    Oral  SpO2: 93% 93% 98%   Weight:        Intake/Output Summary (Last 24 hours) at 12/19/2020 0816 Last data filed at 12/19/2020 0600 Gross per 24 hour  Intake 1775.93 ml  Output 100 ml  Net 1675.93 ml   Last 3 Weights 12/19/2020 12/18/2020 12/17/2020  Weight (lbs) 229 lb 0.9 oz 233 lb 14.5 oz 233 lb 14.5 oz  Weight (kg) 103.9 kg 106.1 kg 106.1 kg      Telemetry    Sinus rhythm, no ventricular tachycardia- Personally Reviewed  ECG    Not performed today- Personally Reviewed  Physical Exam   GEN: No acute distress.   Neck: No JVD Cardiac: RRR, no murmurs, rubs,  or gallops.  Respiratory: Clear to auscultation bilaterally. GI: Soft, nontender, non-distended  MS: No edema; No deformity. Neuro:  Nonfocal  Psych: Normal affect   Labs    High Sensitivity Troponin:   Recent Labs  Lab 12/17/20 2034 12/18/20 0110  TROPONINIHS 155* 2,286*      Chemistry Recent Labs  Lab 12/17/20 2034 12/18/20 0110 12/19/20 0045  NA 132* 134* 134*  K 4.4 4.3 4.1  CL 98 99 102  CO2 19* 23 21*  GLUCOSE 411* 369* 258*  BUN 19 15 11   CREATININE 1.08* 1.07* 0.87  CALCIUM 9.4 9.0 8.7*  GFRNONAA 59* 59* >60  ANIONGAP 15 12 11      Hematology Recent Labs  Lab 12/17/20 2034 12/18/20 0355 12/19/20 0045  WBC 13.3* 8.8 7.5  RBC 4.33 3.82* 3.83*  HGB 12.2 11.2* 11.2*  HCT 36.7 31.9* 32.2*  MCV 84.8 83.5 84.1  MCH 28.2 29.3 29.2  MCHC 33.2 35.1 34.8  RDW 13.0 13.1 13.1  PLT 347 254 253    BNP Recent Labs  Lab 12/18/20 0110  BNP 113.2*     DDimer No results for input(s): DDIMER in the last 168 hours.  Radiology    ECHOCARDIOGRAM COMPLETE  Result Date: 12/18/2020    ECHOCARDIOGRAM REPORT   Patient Name:   Jasmine Richardson Date of Exam: 12/18/2020 Medical Rec #:  [id number]     Height:       64.0 in Accession #:    [id number]    Weight:       233.9 lb Date of Birth:  1961/10/28    BSA:          2.091 m Patient Age:    60 years      BP:           142/69 mmHg Patient Gender: F             HR:           69 bpm. Exam Location:  Inpatient Procedure: 2D Echo, Cardiac Doppler, Color Doppler and Intracardiac            Opacification Agent Indications:    Ventricular Tachycardia I47.2  History:        Patient has prior history of Echocardiogram examinations, most                 recent 05/23/2020. CHF, CAD, Prior CABG, Signs/Symptoms:Chest                 Pain; Risk Factors:Hypertension, Dyslipidemia and Former Smoker.  Sonographer:    Vickie Epley RDCS Referring Phys: [id number] [place]  1. Left ventricular ejection fraction, by estimation, is 45 to 50%.  The left ventricle has mildly decreased function. The left ventricle demonstrates regional wall motion abnormalities (see scoring diagram/findings for description). Left ventricular diastolic parameters are indeterminate.  2. Right ventricular systolic function is normal. The right ventricular size is normal. Tricuspid regurgitation signal is inadequate for assessing PA pressure.  3. The mitral valve is grossly normal. Trivial mitral valve regurgitation.  4. The aortic valve is tricuspid. There is moderate calcification of the aortic valve. Aortic valve regurgitation is not visualized.  5. The inferior vena cava is normal in size with greater than 50% respiratory variability, suggesting right atrial pressure of 3 mmHg. FINDINGS  Left Ventricle: Left ventricular ejection fraction, by estimation, is 45 to 50%. The left ventricle has mildly decreased function. The left ventricle demonstrates regional wall motion abnormalities. Definity contrast agent was given IV to delineate the left ventricular endocardial borders. The left ventricular internal cavity size was normal in size. There is borderline left ventricular hypertrophy. Left ventricular diastolic parameters are indeterminate.  LV Wall Scoring: The mid and distal inferior wall and apical septal segment are hypokinetic. The entire anterior wall, entire lateral wall, anterior septum, mid inferoseptal segment, basal inferior segment, basal inferoseptal segment, and apex are normal. Right Ventricle: The right ventricular size is normal. No increase in right ventricular wall thickness. Right ventricular systolic function is normal. Tricuspid regurgitation signal is inadequate for assessing PA pressure. Left Atrium: Left atrial size was normal in size. Right Atrium: Right atrial size was normal in size. Pericardium: There is no evidence of pericardial effusion. Mitral Valve: The mitral valve is grossly normal. Mild to moderate mitral annular calcification. Trivial  mitral valve regurgitation. Tricuspid Valve: The tricuspid valve is grossly normal. Tricuspid valve regurgitation is mild. Aortic Valve: The aortic valve is tricuspid. There is moderate calcification of the aortic valve. Aortic valve regurgitation is not visualized. Pulmonic Valve: The pulmonic valve was grossly normal. Pulmonic valve regurgitation is trivial. Aorta: The aortic root is normal in size and structure. Venous: The  inferior vena cava is normal in size with greater than 50% respiratory variability, suggesting right atrial pressure of 3 mmHg. IAS/Shunts: No atrial level shunt detected by color flow Doppler.  LEFT VENTRICLE PLAX 2D LVIDd:         4.80 cm      Diastology LVIDs:         4.10 cm      LV e' medial:    3.38 cm/s LV PW:         1.00 cm      LV E/e' medial:  27.0 LV IVS:        1.00 cm      LV e' lateral:   5.03 cm/s LVOT diam:     2.30 cm      LV E/e' lateral: 18.2 LV SV:         84 LV SV Index:   40 LVOT Area:     4.15 cm  LV Volumes (MOD) LV vol d, MOD A2C: 159.0 ml LV vol d, MOD A4C: 109.0 ml LV vol s, MOD A2C: 77.8 ml LV vol s, MOD A4C: 60.8 ml LV SV MOD A2C:     81.2 ml LV SV MOD A4C:     109.0 ml LV SV MOD BP:      65.6 ml RIGHT VENTRICLE RV S prime:     5.56 cm/s TAPSE (M-mode): 1.7 cm LEFT ATRIUM             Index LA diam:        4.20 cm 2.01 cm/m LA Vol (A2C):   31.9 ml 15.26 ml/m LA Vol (A4C):   46.6 ml 22.29 ml/m LA Biplane Vol: 40.4 ml 19.32 ml/m  AORTIC VALVE LVOT Vmax:   97.90 cm/s LVOT Vmean:  64.200 cm/s LVOT VTI:    0.203 m  AORTA Ao Root diam: 3.50 cm MITRAL VALVE MV Area (PHT): 2.73 cm    SHUNTS MV Decel Time: 278 msec    Systemic VTI:  0.20 m MV E velocity: 91.30 cm/s  Systemic Diam: 2.30 cm MV A velocity: 95.10 cm/s MV E/A ratio:  0.96 Rozann Lesches MD Electronically signed by Rozann Lesches MD Signature Date/Time: 12/18/2020/3:34:18 PM    Final     Cardiac Studies   2D echocardiogram (12/18/2020)  IMPRESSIONS    1. Left ventricular ejection fraction, by  estimation, is 45 to 50%. The  left ventricle has mildly decreased function. The left ventricle  demonstrates regional wall motion abnormalities (see scoring  diagram/findings for description). Left ventricular  diastolic parameters are indeterminate.  2. Right ventricular systolic function is normal. The right ventricular  size is normal. Tricuspid regurgitation signal is inadequate for assessing  PA pressure.  3. The mitral valve is grossly normal. Trivial mitral valve  regurgitation.  4. The aortic valve is tricuspid. There is moderate calcification of the  aortic valve. Aortic valve regurgitation is not visualized.  5. The inferior vena cava is normal in size with greater than 50%  respiratory variability, suggesting right atrial pressure of 3 mmHg.  Patient Profile     Jennings Robbinsis a 60 y.o.femalewith a hx of CAD s/p CABG (04/23/16 c/b post operative mediastinitis and reoperationwith amyocutaneous pectoral flaps) and recent complex PCI of LM disease (06/2020), HFpEF, poorly controlled DMII, hypertension, hyperlipidemia, morbid obesity, and GERD who presents to he ED with acute onset chest discomfort and was found to be in wide complex tachycardia concerning for sustained ventricular tachycardia.   Assessment & Plan  1: Coronary artery disease-history of CABG 04/23/2016 with subsequent PCI of her mid and distal RCA by Dr. Fletcher Anon.  She had shockwave angioplasty, IVUS guided left main intervention by Dr. Ellyn Hack last year.  She was admitted with chest pain in the setting of ventricular tachycardia.  Her troponins rose to 2200 probably related to demand ischemia.  She is on IV heparin.  Plan for coronary angiography this morning.  2: Wide-complex tachycardia-ventricular tachycardia, question whether ischemically mediated versus related to hypomagnesemia.  Mag is been replaced.  Plan for left heart cath this morning to assess anatomy  3: Essential hypertension-on lisinopril and  Toprol.  Blood pressures have been well controlled  4: Hyperlipidemia-on statin therapy  5: Hypomagnesemia-initial magnesium was 1.3.  She did receive 4 g IV.  Her magnesium this morning is 1.9.  We will give an additional 2 g IV.  6: HFpEF-2D echo revealed EF of 45 to 50% similar to prior echoes.  She is on a beta-blocker and an ACE inhibitor.  7: Type 2 diabetes-on Lantus insulin with sliding scale.    For questions or updates, please contact [place] Please consult [email or web address] for contact info under        Signed, Quay Burow, MD  12/19/2020, 8:16 AM     Addendum: Results of cardiac catheterization performed by Dr. Angelena Form noted.  The left main stent is widely patent.  The LIMA to the LAD is widely patent.  All other vein grafts are occluded.  The stent to the mid to distal RCA is patent as well.  There is a stenosis in the distal PLA branch as well as in a diagonal branch both of which were present on his her prior cath.  I think proceeding with ICD therapy for secondary prevention given her VT is appropriate.  Lorretta Harp, M.D., [place], [facility], [place]verta [place] Williamson [address] [place], [place]  [address]  [phone] 12/19/2020 12:31 PM

## 2020-12-19 NOTE — Progress Notes (Signed)
Initial Nutrition Assessment  DOCUMENTATION CODES:   Not applicable  INTERVENTION:   Ensure Enlive po BID, each supplement provides 350 kcal and 20 grams of protein   NUTRITION DIAGNOSIS:   Inadequate oral intake related to decreased appetite as evidenced by meal completion < 50%.  GOAL:   Patient will meet greater than or equal to 90% of their needs   MONITOR:   PO intake,Supplement acceptance,Labs,Weight trends  REASON FOR ASSESSMENT:   Malnutrition Screening Tool    ASSESSMENT:   60 yo female admitted with VT, ICM. PMH includes CAD s/p CABG, HTN, DM, HLD, CHF.   Recorded po intake 35-50% of meals  No wt loss trend per weight encounters; weight of 103.9 kg currently. Weight of 105.7 k in August but 102.7 kg in July. Weight appears to fluctuate  Labs: CBGs 258-299 (ICU goal 140-180) Meds: ss novolog, lantus   Diet Order:   Diet Order            Diet Carb Modified Fluid consistency: Thin; Room service appropriate? Yes  Diet effective now                 EDUCATION NEEDS:      Skin:  Skin Assessment: Reviewed RN Assessment  Last BM:  no documented BM  Height:   Ht Readings from Last 1 Encounters:  08/08/20 5\' 4"  (1.626 m)    Weight:   Wt Readings from Last 1 Encounters:  12/19/20 103.9 kg    BMI:  Body mass index is 39.32 kg/m.  Estimated Nutritional Needs:   Kcal:  1700-1900 kcals  Protein:  100-115 g  Fluid:  >/= 1.7 L    02/16/21 MS, RDN, LDN, CNSC Registered Dietitian III Clinical Nutrition RD Pager and On-Call Pager Number Located in [place]

## 2020-12-19 NOTE — H&P (View-Only) (Signed)
Progress Note  Patient Name: Jasmine Richardson Date of Encounter: 12/19/2020  [facility] Cardiologist: Shirlee More, MD   Subjective   Hard to rest well in the hospital, no complaints otherwise  Inpatient Medications    Scheduled Meds:  aspirin EC  81 mg Oral Daily   clopidogrel  75 mg Oral QHS   gentamicin irrigation  80 mg Irrigation To Cath   heparin  5,000 Units Subcutaneous Q8H   icosapent Ethyl  2 g Oral BID   insulin aspart  0-20 Units Subcutaneous TID WC   insulin aspart  0-5 Units Subcutaneous QHS   insulin glargine  15 Units Subcutaneous Daily   lisinopril  20 mg Oral Daily   metoprolol succinate  150 mg Oral Daily   pantoprazole  40 mg Oral Daily   pravastatin  20 mg Oral Daily   sodium chloride flush  3 mL Intravenous Q12H   sodium chloride flush  3 mL Intravenous Q12H   Continuous Infusions:  sodium chloride 106 mL/hr at 12/19/20 0600   sodium chloride     sodium chloride 50 mL/hr at 12/19/20 [id number]   sodium chloride 1 mL/kg/hr (12/19/20 0501)   amiodarone 30 mg/hr (12/19/20 0600)    ceFAZolin (ANCEF) IV     magnesium sulfate bolus IVPB 2 g (12/19/20 0821)   PRN Meds: sodium chloride, sodium chloride, acetaminophen, docusate sodium, nitroGLYCERIN, sodium chloride flush, sodium chloride flush   Vital Signs    Vitals:   12/19/20 0500 12/19/20 0600 12/19/20 0700 12/19/20 0803  BP: 120/60 128/63 124/68   Pulse: 72 75 72   Resp: 20 15 20    Temp:    98.1 F (36.7 C)  TempSrc:    Oral  SpO2: 93% 93% 98%   Weight:        Intake/Output Summary (Last 24 hours) at 12/19/2020 0826 Last data filed at 12/19/2020 0600 Gross per 24 hour  Intake 1775.93 ml  Output 100 ml  Net 1675.93 ml   Last 3 Weights 12/19/2020 12/18/2020 12/17/2020  Weight (lbs) 229 lb 0.9 oz 233 lb 14.5 oz 233 lb 14.5 oz  Weight (kg) 103.9 kg 106.1 kg 106.1 kg      Telemetry    SR 60's-80s - Personally Reviewed  ECG    No new EKGs - Personally Reviewed  Physical Exam   GEN: No acute  distress.   Neck: No JVD Cardiac: RRR, no murmurs, rubs, or gallops.  Respiratory: Clear to auscultation bilaterally. GI: Soft, nontender, non-distended  MS: No edema; No deformity. Neuro:  Nonfocal  Psych: Normal affect   Labs    High Sensitivity Troponin:   Recent Labs  Lab 12/17/20 2034 12/18/20 0110  TROPONINIHS 155* 2,286*      Chemistry Recent Labs  Lab 12/17/20 2034 12/18/20 0110 12/19/20 0045  NA 132* 134* 134*  K 4.4 4.3 4.1  CL 98 99 102  CO2 19* 23 21*  GLUCOSE 411* 369* 258*  BUN 19 15 11   CREATININE 1.08* 1.07* 0.87  CALCIUM 9.4 9.0 8.7*  GFRNONAA 59* 59* >60  ANIONGAP 15 12 11      Hematology Recent Labs  Lab 12/17/20 2034 12/18/20 0355 12/19/20 0045  WBC 13.3* 8.8 7.5  RBC 4.33 3.82* 3.83*  HGB 12.2 11.2* 11.2*  HCT 36.7 31.9* 32.2*  MCV 84.8 83.5 84.1  MCH 28.2 29.3 29.2  MCHC 33.2 35.1 34.8  RDW 13.0 13.1 13.1  PLT 347 254 253    BNP Recent Labs  Lab 12/18/20  0110  BNP 113.2*     DDimer No results for input(s): DDIMER in the last 168 hours.   Radiology    ECHOCARDIOGRAM COMPLETE  Result Date: 12/18/2020    ECHOCARDIOGRAM REPORT   Patient Name:   [name] Date of Exam: 12/18/2020 Medical Rec #:  [id number]     Height:       64.0 in Accession #:    [id number]    Weight:       233.9 lb Date of Birth:  12-02-1961    BSA:          2.091 m Patient Age:    60 years      BP:           142/69 mmHg Patient Gender: F             HR:           69 bpm. Exam Location:  Inpatient Procedure: 2D Echo, Cardiac Doppler, Color Doppler and Intracardiac            Opacification Agent Indications:    Ventricular Tachycardia I47.2  History:        Patient has prior history of Echocardiogram examinations, most                 recent 05/23/2020. CHF, CAD, Prior CABG, Signs/Symptoms:Chest                 Pain; Risk Factors:Hypertension, Dyslipidemia and Former Smoker.  Sonographer:    Vickie Epley RDCS Referring Phys: [id number] Lucas  1. Left  ventricular ejection fraction, by estimation, is 45 to 50%. The left ventricle has mildly decreased function. The left ventricle demonstrates regional wall motion abnormalities (see scoring diagram/findings for description). Left ventricular diastolic parameters are indeterminate.  2. Right ventricular systolic function is normal. The right ventricular size is normal. Tricuspid regurgitation signal is inadequate for assessing PA pressure.  3. The mitral valve is grossly normal. Trivial mitral valve regurgitation.  4. The aortic valve is tricuspid. There is moderate calcification of the aortic valve. Aortic valve regurgitation is not visualized.  5. The inferior vena cava is normal in size with greater than 50% respiratory variability, suggesting right atrial pressure of 3 mmHg. FINDINGS  Left Ventricle: Left ventricular ejection fraction, by estimation, is 45 to 50%. The left ventricle has mildly decreased function. The left ventricle demonstrates regional wall motion abnormalities. Definity contrast agent was given IV to delineate the left ventricular endocardial borders. The left ventricular internal cavity size was normal in size. There is borderline left ventricular hypertrophy. Left ventricular diastolic parameters are indeterminate.  LV Wall Scoring: The mid and distal inferior wall and apical septal segment are hypokinetic. The entire anterior wall, entire lateral wall, anterior septum, mid inferoseptal segment, basal inferior segment, basal inferoseptal segment, and apex are normal. Right Ventricle: The right ventricular size is normal. No increase in right ventricular wall thickness. Right ventricular systolic function is normal. Tricuspid regurgitation signal is inadequate for assessing PA pressure. Left Atrium: Left atrial size was normal in size. Right Atrium: Right atrial size was normal in size. Pericardium: There is no evidence of pericardial effusion. Mitral Valve: The mitral valve is grossly normal.  Mild to moderate mitral annular calcification. Trivial mitral valve regurgitation. Tricuspid Valve: The tricuspid valve is grossly normal. Tricuspid valve regurgitation is mild. Aortic Valve: The aortic valve is tricuspid. There is moderate calcification of the aortic valve. Aortic valve regurgitation is not visualized. Pulmonic Valve: The pulmonic  valve was grossly normal. Pulmonic valve regurgitation is trivial. Aorta: The aortic root is normal in size and structure. Venous: The inferior vena cava is normal in size with greater than 50% respiratory variability, suggesting right atrial pressure of 3 mmHg. IAS/Shunts: No atrial level shunt detected by color flow Doppler.  LEFT VENTRICLE PLAX 2D LVIDd:         4.80 cm      Diastology LVIDs:         4.10 cm      LV e' medial:    3.38 cm/s LV PW:         1.00 cm      LV E/e' medial:  27.0 LV IVS:        1.00 cm      LV e' lateral:   5.03 cm/s LVOT diam:     2.30 cm      LV E/e' lateral: 18.2 LV SV:         84 LV SV Index:   40 LVOT Area:     4.15 cm  LV Volumes (MOD) LV vol d, MOD A2C: 159.0 ml LV vol d, MOD A4C: 109.0 ml LV vol s, MOD A2C: 77.8 ml LV vol s, MOD A4C: 60.8 ml LV SV MOD A2C:     81.2 ml LV SV MOD A4C:     109.0 ml LV SV MOD BP:      65.6 ml RIGHT VENTRICLE RV S prime:     5.56 cm/s TAPSE (M-mode): 1.7 cm LEFT ATRIUM             Index LA diam:        4.20 cm 2.01 cm/m LA Vol (A2C):   31.9 ml 15.26 ml/m LA Vol (A4C):   46.6 ml 22.29 ml/m LA Biplane Vol: 40.4 ml 19.32 ml/m  AORTIC VALVE LVOT Vmax:   97.90 cm/s LVOT Vmean:  64.200 cm/s LVOT VTI:    0.203 m  AORTA Ao Root diam: 3.50 cm MITRAL VALVE MV Area (PHT): 2.73 cm    SHUNTS MV Decel Time: 278 msec    Systemic VTI:  0.20 m MV E velocity: 91.30 cm/s  Systemic Diam: 2.30 cm MV A velocity: 95.10 cm/s MV E/A ratio:  0.96 Rozann Lesches MD Electronically signed by Rozann Lesches MD Signature Date/Time: 12/18/2020/3:34:18 PM    Final     Cardiac Studies    12/18/2020: TTE IMPRESSIONS   1. Left  ventricular ejection fraction, by estimation, is 45 to 50%. The  left ventricle has mildly decreased function. The left ventricle  demonstrates regional wall motion abnormalities (see scoring  diagram/findings for description). Left ventricular  diastolic parameters are indeterminate.   2. Right ventricular systolic function is normal. The right ventricular  size is normal. Tricuspid regurgitation signal is inadequate for assessing  PA pressure.   3. The mitral valve is grossly normal. Trivial mitral valve  regurgitation.   4. The aortic valve is tricuspid. There is moderate calcification of the  aortic valve. Aortic valve regurgitation is not visualized.   5. The inferior vena cava is normal in size with greater than 50%  respiratory variability, suggesting right atrial pressure of 3 mmHg.   LV Wall Scoring:  The mid and distal inferior wall and apical septal segment are  hypokinetic.  The entire anterior wall, entire lateral wall, anterior septum, mid  inferoseptal segment, basal inferior segment, basal inferoseptal segment,  and  apex are normal.     06/22/2020; PCI Mid  LAD lesion is 100% stenosed. Ost LAD to Prox LAD lesion is 60% stenosed with 80% stenosed side branch in 1st Diag. 1st Diag lesion is 85% stenosed. Post intervention, there is a 100% residual stenosis. Mid LM lesion is 85% stenosed. A drug-eluting stent was successfully placed using a SYNERGY XD 4.0X16. Post intervention, there is a 0% residual stenosis.   SUMMARY Successful SHOCKWAVE INTRAVASCULAR LITHOTRIPSY based DES PCI of LEFT MAIN using Synergy DES 4.0 mm x 16 mm postdilated to 4.6 mm (based on INTRAVASCULAR ULTRASOUND measurement) -> loss of small caliber 1st Diag due to mid vessel wire dissection @ initial Graft insertion site (too small for PCI). Unfortunately the small caliber first diagonal branch being the most straight shot vessel for wiring did have significant lesions.  There did appear to be a mid  vessel dissection at the 85% lesion.  After IC nitroglycerin, TIMI I-II flow was restored.  With the concern for dissection, I chose not to try to rewire of 1.5 to 2 mm vessel.      05/25/2020: PCI Prox RCA lesion is 30% stenosed. Mid RCA to Dist RCA lesion is 90% stenosed. Dist RCA lesion is 70% stenosed. RPDA lesion is 100% stenosed. RPAV lesion is 70% stenosed. A drug-eluting stent was successfully placed using a STENT RESOLUTE ONYX 2.75X38. Post intervention, there is a 0% residual stenosis. Post intervention, there is a 0% residual stenosis.   Successful angioplasty and drug-eluting stent placement to the mid/distal right coronary artery for severe in-stent restenosis as well as significant disease distal to the stented segment. Transient acute closure of the vessel likely due to thrombus formation after predilation.  Significant difficulty advancing equipment beyond the lesion due to tortuosity, previously stented segment and calcifications.  Successful stent placement was performed with the use of telescope catheter.   Recommendations: Dual antiplatelet therapy for at least 1 year and preferably longer. Aggressive treatment of risk factors. Left main PCI was planned but given difficulty with RCA PCI, recommend staging left main PCI to the outpatient setting.    05/23/2020: LHC Conclusions: Severe native coronary artery disease, including 80-90% mid LMCA stenosis, occluded mid LAD, diffusely disease LCx with occlusion of OM1, and 80% in-stent restenosis of the mid/distal RCA followed by 70% distal RCA and rPL lesions, as well as occlusion of the ostial rPDA. Patent LIMA-LAD. Occluded SVG-D2, SVG-OM1, and SVG-rPDA. Upper normal left ventricular filling pressure. Small left radial artery with significant vasospasm.  Recommend alternate access for further catheterizations.   Recommendations: Staged PCI to mid/distal RCA and mid LMCA (likely Wednesday if renal function remains  stable).  Recommend femoral approach, given significant left radial vasospasm with 5F diagnostic catheters. Continue dual antiplatelet therapy with aspirin and clopidogrel for at least 12 months, ideally longer. Aggressive secondary prevention, including escalation of statin therapy.     Patient Profile     59 y.o. female CAD (CABG 2017 complicted by mediastinitis, s/p myocutaneous flaps, and most recently PCI ri RCA June 2021 and of the LM in July 21> DM, HTN, HLD, morbid obesity, GERDadmitted 12/17/20 was in her usual state of health when she developed sob and chest pressure and presented with a RBBB wide QRS tachycardia which was poorly tolerated. She was treated with IV amio and lidocaine but was then cardioverted while awake back to NSR  Assessment & Plan    1. VT Planned for LHC today, if no new/obstructive disease or interventions will plan for ICD implant Dr. Vicki Chaffin has seen and examined the patient   this morning, revisit the plans and procedures with her She remains agreeable to both.  On amio gtt Will transition to PO pending cath findings procedures today  Mag was 1.3 >>> 1.9 today after replacement  2. CAD     No anginal complaints     HS Trop 2286 last     On ASA, Plavix, BB, statin  3. ICM     40-45%      No exam findings of volume OL      On BB/ACE  4. HTN      No changes   For questions or updates, please contact [facility] Please consult [email or web address] for contact info under     Signed, Sheilah Pigeon, PA-C  12/19/2020, 8:26 AM    EP Attending  Patient seen and examined. Agree with above. The patient looks better today and her  VT is improved. She will undergo ICD insertion.  Sharlot Gowda Aurorah Schlachter,MD

## 2020-12-19 NOTE — H&P (View-Only) (Signed)
Progress Note  Patient Name: Jasmine Richardson Date of Encounter: 12/19/2020  [facility] Cardiologist: Norman Herrlich, MD   Subjective   No chest pain or shortness of breath  Inpatient Medications    Scheduled Meds: . aspirin EC  81 mg Oral Daily  . clopidogrel  75 mg Oral QHS  . gentamicin irrigation  80 mg Irrigation To Cath  . heparin  5,000 Units Subcutaneous Q8H  . icosapent Ethyl  2 g Oral BID  . insulin aspart  0-15 Units Subcutaneous TID AC & HS  . insulin glargine  15 Units Subcutaneous Daily  . lisinopril  20 mg Oral Daily  . metoprolol succinate  150 mg Oral Daily  . pantoprazole  40 mg Oral Daily  . pravastatin  20 mg Oral Daily  . sodium chloride flush  3 mL Intravenous Q12H  . sodium chloride flush  3 mL Intravenous Q12H   Continuous Infusions: . sodium chloride 106 mL/hr at 12/19/20 0600  . sodium chloride    . sodium chloride 50 mL/hr at 12/19/20 0605  . sodium chloride 1 mL/kg/hr (12/19/20 0501)  . amiodarone 30 mg/hr (12/19/20 0600)  .  ceFAZolin (ANCEF) IV     PRN Meds: sodium chloride, sodium chloride, acetaminophen, docusate sodium, nitroGLYCERIN, sodium chloride flush, sodium chloride flush   Vital Signs    Vitals:   12/19/20 0500 12/19/20 0600 12/19/20 0700 12/19/20 0803  BP: 120/60 128/63 124/68   Pulse: 72 75 72   Resp: 20 15 20    Temp:    98.1 F (36.7 C)  TempSrc:    Oral  SpO2: 93% 93% 98%   Weight:        Intake/Output Summary (Last 24 hours) at 12/19/2020 0816 Last data filed at 12/19/2020 0600 Gross per 24 hour  Intake 1775.93 ml  Output 100 ml  Net 1675.93 ml   Last 3 Weights 12/19/2020 12/18/2020 12/17/2020  Weight (lbs) 229 lb 0.9 oz 233 lb 14.5 oz 233 lb 14.5 oz  Weight (kg) 103.9 kg 106.1 kg 106.1 kg      Telemetry    Sinus rhythm, no ventricular tachycardia- Personally Reviewed  ECG    Not performed today- Personally Reviewed  Physical Exam   GEN: No acute distress.   Neck: No JVD Cardiac: RRR, no murmurs, rubs,  or gallops.  Respiratory: Clear to auscultation bilaterally. GI: Soft, nontender, non-distended  MS: No edema; No deformity. Neuro:  Nonfocal  Psych: Normal affect   Labs    High Sensitivity Troponin:   Recent Labs  Lab 12/17/20 2034 12/18/20 0110  TROPONINIHS 155* 2,286*      Chemistry Recent Labs  Lab 12/17/20 2034 12/18/20 0110 12/19/20 0045  NA 132* 134* 134*  K 4.4 4.3 4.1  CL 98 99 102  CO2 19* 23 21*  GLUCOSE 411* 369* 258*  BUN 19 15 11   CREATININE 1.08* 1.07* 0.87  CALCIUM 9.4 9.0 8.7*  GFRNONAA 59* 59* >60  ANIONGAP 15 12 11      Hematology Recent Labs  Lab 12/17/20 2034 12/18/20 0355 12/19/20 0045  WBC 13.3* 8.8 7.5  RBC 4.33 3.82* 3.83*  HGB 12.2 11.2* 11.2*  HCT 36.7 31.9* 32.2*  MCV 84.8 83.5 84.1  MCH 28.2 29.3 29.2  MCHC 33.2 35.1 34.8  RDW 13.0 13.1 13.1  PLT 347 254 253    BNP Recent Labs  Lab 12/18/20 0110  BNP 113.2*     DDimer No results for input(s): DDIMER in the last 168 hours.  Radiology    ECHOCARDIOGRAM COMPLETE  Result Date: 12/18/2020    ECHOCARDIOGRAM REPORT   Patient Name:   Jasmine Richardson Date of Exam: 12/18/2020 Medical Rec #:  [id number]     Height:       64.0 in Accession #:    [id number]    Weight:       233.9 lb Date of Birth:  1961-03-10    BSA:          2.091 m Patient Age:    60 years      BP:           142/69 mmHg Patient Gender: F             HR:           69 bpm. Exam Location:  Inpatient Procedure: 2D Echo, Cardiac Doppler, Color Doppler and Intracardiac            Opacification Agent Indications:    Ventricular Tachycardia I47.2  History:        Patient has prior history of Echocardiogram examinations, most                 recent 05/23/2020. CHF, CAD, Prior CABG, Signs/Symptoms:Chest                 Pain; Risk Factors:Hypertension, Dyslipidemia and Former Smoker.  Sonographer:    Vickie Epley RDCS Referring Phys: [id number] [place]  1. Left ventricular ejection fraction, by estimation, is 45 to 50%.  The left ventricle has mildly decreased function. The left ventricle demonstrates regional wall motion abnormalities (see scoring diagram/findings for description). Left ventricular diastolic parameters are indeterminate.  2. Right ventricular systolic function is normal. The right ventricular size is normal. Tricuspid regurgitation signal is inadequate for assessing PA pressure.  3. The mitral valve is grossly normal. Trivial mitral valve regurgitation.  4. The aortic valve is tricuspid. There is moderate calcification of the aortic valve. Aortic valve regurgitation is not visualized.  5. The inferior vena cava is normal in size with greater than 50% respiratory variability, suggesting right atrial pressure of 3 mmHg. FINDINGS  Left Ventricle: Left ventricular ejection fraction, by estimation, is 45 to 50%. The left ventricle has mildly decreased function. The left ventricle demonstrates regional wall motion abnormalities. Definity contrast agent was given IV to delineate the left ventricular endocardial borders. The left ventricular internal cavity size was normal in size. There is borderline left ventricular hypertrophy. Left ventricular diastolic parameters are indeterminate.  LV Wall Scoring: The mid and distal inferior wall and apical septal segment are hypokinetic. The entire anterior wall, entire lateral wall, anterior septum, mid inferoseptal segment, basal inferior segment, basal inferoseptal segment, and apex are normal. Right Ventricle: The right ventricular size is normal. No increase in right ventricular wall thickness. Right ventricular systolic function is normal. Tricuspid regurgitation signal is inadequate for assessing PA pressure. Left Atrium: Left atrial size was normal in size. Right Atrium: Right atrial size was normal in size. Pericardium: There is no evidence of pericardial effusion. Mitral Valve: The mitral valve is grossly normal. Mild to moderate mitral annular calcification. Trivial  mitral valve regurgitation. Tricuspid Valve: The tricuspid valve is grossly normal. Tricuspid valve regurgitation is mild. Aortic Valve: The aortic valve is tricuspid. There is moderate calcification of the aortic valve. Aortic valve regurgitation is not visualized. Pulmonic Valve: The pulmonic valve was grossly normal. Pulmonic valve regurgitation is trivial. Aorta: The aortic root is normal in size and structure. Venous: The  inferior vena cava is normal in size with greater than 50% respiratory variability, suggesting right atrial pressure of 3 mmHg. IAS/Shunts: No atrial level shunt detected by color flow Doppler.  LEFT VENTRICLE PLAX 2D LVIDd:         4.80 cm      Diastology LVIDs:         4.10 cm      LV e' medial:    3.38 cm/s LV PW:         1.00 cm      LV E/e' medial:  27.0 LV IVS:        1.00 cm      LV e' lateral:   5.03 cm/s LVOT diam:     2.30 cm      LV E/e' lateral: 18.2 LV SV:         84 LV SV Index:   40 LVOT Area:     4.15 cm  LV Volumes (MOD) LV vol d, MOD A2C: 159.0 ml LV vol d, MOD A4C: 109.0 ml LV vol s, MOD A2C: 77.8 ml LV vol s, MOD A4C: 60.8 ml LV SV MOD A2C:     81.2 ml LV SV MOD A4C:     109.0 ml LV SV MOD BP:      65.6 ml RIGHT VENTRICLE RV S prime:     5.56 cm/s TAPSE (M-mode): 1.7 cm LEFT ATRIUM             Index LA diam:        4.20 cm 2.01 cm/m LA Vol (A2C):   31.9 ml 15.26 ml/m LA Vol (A4C):   46.6 ml 22.29 ml/m LA Biplane Vol: 40.4 ml 19.32 ml/m  AORTIC VALVE LVOT Vmax:   97.90 cm/s LVOT Vmean:  64.200 cm/s LVOT VTI:    0.203 m  AORTA Ao Root diam: 3.50 cm MITRAL VALVE MV Area (PHT): 2.73 cm    SHUNTS MV Decel Time: 278 msec    Systemic VTI:  0.20 m MV E velocity: 91.30 cm/s  Systemic Diam: 2.30 cm MV A velocity: 95.10 cm/s MV E/A ratio:  0.96 Rozann Lesches MD Electronically signed by Rozann Lesches MD Signature Date/Time: 12/18/2020/3:34:18 PM    Final     Cardiac Studies   2D echocardiogram (12/18/2020)  IMPRESSIONS    1. Left ventricular ejection fraction, by  estimation, is 45 to 50%. The  left ventricle has mildly decreased function. The left ventricle  demonstrates regional wall motion abnormalities (see scoring  diagram/findings for description). Left ventricular  diastolic parameters are indeterminate.  2. Right ventricular systolic function is normal. The right ventricular  size is normal. Tricuspid regurgitation signal is inadequate for assessing  PA pressure.  3. The mitral valve is grossly normal. Trivial mitral valve  regurgitation.  4. The aortic valve is tricuspid. There is moderate calcification of the  aortic valve. Aortic valve regurgitation is not visualized.  5. The inferior vena cava is normal in size with greater than 50%  respiratory variability, suggesting right atrial pressure of 3 mmHg.  Patient Profile     Jasmine Richardson a 60 y.o.femalewith a hx of CAD s/p CABG (04/23/16 c/b post operative mediastinitis and reoperationwith amyocutaneous pectoral flaps) and recent complex PCI of LM disease (06/2020), HFpEF, poorly controlled DMII, hypertension, hyperlipidemia, morbid obesity, and GERD who presents to he ED with acute onset chest discomfort and was found to be in wide complex tachycardia concerning for sustained ventricular tachycardia.   Assessment & Plan  1: Coronary artery disease-history of CABG 04/23/2016 with subsequent PCI of her mid and distal RCA by Dr. Kirke Corin.  She had shockwave angioplasty, IVUS guided left main intervention by Dr. Herbie Baltimore last year.  She was admitted with chest pain in the setting of ventricular tachycardia.  Her troponins rose to 2200 probably related to demand ischemia.  She is on IV heparin.  Plan for coronary angiography this morning.  2: Wide-complex tachycardia-ventricular tachycardia, question whether ischemically mediated versus related to hypomagnesemia.  Mag is been replaced.  Plan for left heart cath this morning to assess anatomy  3: Essential hypertension-on lisinopril and  Toprol.  Blood pressures have been well controlled  4: Hyperlipidemia-on statin therapy  5: Hypomagnesemia-initial magnesium was 1.3.  She did receive 4 g IV.  Her magnesium this morning is 1.9.  We will give an additional 2 g IV.  6: HFpEF-2D echo revealed EF of 45 to 50% similar to prior echoes.  She is on a beta-blocker and an ACE inhibitor.  7: Type 2 diabetes-on Lantus insulin with sliding scale.    For questions or updates, please contact [facility] Please consult [email or web address] for contact info under        Signed, Nanetta Batty, MD  12/19/2020, 8:16 AM

## 2020-12-19 NOTE — Progress Notes (Signed)
Error in charting.

## 2020-12-19 NOTE — Progress Notes (Addendum)
Progress Note  Patient Name: Jasmine Richardson Date of Encounter: 12/19/2020  [facility] Cardiologist: Shirlee More, MD   Subjective   Hard to rest well in the hospital, no complaints otherwise  Inpatient Medications    Scheduled Meds:  aspirin EC  81 mg Oral Daily   clopidogrel  75 mg Oral QHS   gentamicin irrigation  80 mg Irrigation To Cath   heparin  5,000 Units Subcutaneous Q8H   icosapent Ethyl  2 g Oral BID   insulin aspart  0-20 Units Subcutaneous TID WC   insulin aspart  0-5 Units Subcutaneous QHS   insulin glargine  15 Units Subcutaneous Daily   lisinopril  20 mg Oral Daily   metoprolol succinate  150 mg Oral Daily   pantoprazole  40 mg Oral Daily   pravastatin  20 mg Oral Daily   sodium chloride flush  3 mL Intravenous Q12H   sodium chloride flush  3 mL Intravenous Q12H   Continuous Infusions:  sodium chloride 106 mL/hr at 12/19/20 0600   sodium chloride     sodium chloride 50 mL/hr at 12/19/20 [id number]   sodium chloride 1 mL/kg/hr (12/19/20 0501)   amiodarone 30 mg/hr (12/19/20 0600)    ceFAZolin (ANCEF) IV     magnesium sulfate bolus IVPB 2 g (12/19/20 0821)   PRN Meds: sodium chloride, sodium chloride, acetaminophen, docusate sodium, nitroGLYCERIN, sodium chloride flush, sodium chloride flush   Vital Signs    Vitals:   12/19/20 0500 12/19/20 0600 12/19/20 0700 12/19/20 0803  BP: 120/60 128/63 124/68   Pulse: 72 75 72   Resp: 20 15 20    Temp:    98.1 F (36.7 C)  TempSrc:    Oral  SpO2: 93% 93% 98%   Weight:        Intake/Output Summary (Last 24 hours) at 12/19/2020 0826 Last data filed at 12/19/2020 0600 Gross per 24 hour  Intake 1775.93 ml  Output 100 ml  Net 1675.93 ml   Last 3 Weights 12/19/2020 12/18/2020 12/17/2020  Weight (lbs) 229 lb 0.9 oz 233 lb 14.5 oz 233 lb 14.5 oz  Weight (kg) 103.9 kg 106.1 kg 106.1 kg      Telemetry    SR 60's-80s - Personally Reviewed  ECG    No new EKGs - Personally Reviewed  Physical Exam   GEN: No acute  distress.   Neck: No JVD Cardiac: RRR, no murmurs, rubs, or gallops.  Respiratory: Clear to auscultation bilaterally. GI: Soft, nontender, non-distended  MS: No edema; No deformity. Neuro:  Nonfocal  Psych: Normal affect   Labs    High Sensitivity Troponin:   Recent Labs  Lab 12/17/20 2034 12/18/20 0110  TROPONINIHS 155* 2,286*      Chemistry Recent Labs  Lab 12/17/20 2034 12/18/20 0110 12/19/20 0045  NA 132* 134* 134*  K 4.4 4.3 4.1  CL 98 99 102  CO2 19* 23 21*  GLUCOSE 411* 369* 258*  BUN 19 15 11   CREATININE 1.08* 1.07* 0.87  CALCIUM 9.4 9.0 8.7*  GFRNONAA 59* 59* >60  ANIONGAP 15 12 11      Hematology Recent Labs  Lab 12/17/20 2034 12/18/20 0355 12/19/20 0045  WBC 13.3* 8.8 7.5  RBC 4.33 3.82* 3.83*  HGB 12.2 11.2* 11.2*  HCT 36.7 31.9* 32.2*  MCV 84.8 83.5 84.1  MCH 28.2 29.3 29.2  MCHC 33.2 35.1 34.8  RDW 13.0 13.1 13.1  PLT 347 254 253    BNP Recent Labs  Lab 12/18/20  0110  BNP 113.2*     DDimer No results for input(s): DDIMER in the last 168 hours.   Radiology    ECHOCARDIOGRAM COMPLETE  Result Date: 12/18/2020    ECHOCARDIOGRAM REPORT   Patient Name:   Jasmine Richardson Date of Exam: 12/18/2020 Medical Rec #:  [id number]     Height:       64.0 in Accession #:    [id number]    Weight:       233.9 lb Date of Birth:  Apr 26, 1961    BSA:          2.091 m Patient Age:    60 years      BP:           142/69 mmHg Patient Gender: F             HR:           69 bpm. Exam Location:  Inpatient Procedure: 2D Echo, Cardiac Doppler, Color Doppler and Intracardiac            Opacification Agent Indications:    Ventricular Tachycardia I47.2  History:        Patient has prior history of Echocardiogram examinations, most                 recent 05/23/2020. CHF, CAD, Prior CABG, Signs/Symptoms:Chest                 Pain; Risk Factors:Hypertension, Dyslipidemia and Former Smoker.  Sonographer:    Vickie Epley RDCS Referring Phys: [id number] [place]  1. Left  ventricular ejection fraction, by estimation, is 45 to 50%. The left ventricle has mildly decreased function. The left ventricle demonstrates regional wall motion abnormalities (see scoring diagram/findings for description). Left ventricular diastolic parameters are indeterminate.  2. Right ventricular systolic function is normal. The right ventricular size is normal. Tricuspid regurgitation signal is inadequate for assessing PA pressure.  3. The mitral valve is grossly normal. Trivial mitral valve regurgitation.  4. The aortic valve is tricuspid. There is moderate calcification of the aortic valve. Aortic valve regurgitation is not visualized.  5. The inferior vena cava is normal in size with greater than 50% respiratory variability, suggesting right atrial pressure of 3 mmHg. FINDINGS  Left Ventricle: Left ventricular ejection fraction, by estimation, is 45 to 50%. The left ventricle has mildly decreased function. The left ventricle demonstrates regional wall motion abnormalities. Definity contrast agent was given IV to delineate the left ventricular endocardial borders. The left ventricular internal cavity size was normal in size. There is borderline left ventricular hypertrophy. Left ventricular diastolic parameters are indeterminate.  LV Wall Scoring: The mid and distal inferior wall and apical septal segment are hypokinetic. The entire anterior wall, entire lateral wall, anterior septum, mid inferoseptal segment, basal inferior segment, basal inferoseptal segment, and apex are normal. Right Ventricle: The right ventricular size is normal. No increase in right ventricular wall thickness. Right ventricular systolic function is normal. Tricuspid regurgitation signal is inadequate for assessing PA pressure. Left Atrium: Left atrial size was normal in size. Right Atrium: Right atrial size was normal in size. Pericardium: There is no evidence of pericardial effusion. Mitral Valve: The mitral valve is grossly normal.  Mild to moderate mitral annular calcification. Trivial mitral valve regurgitation. Tricuspid Valve: The tricuspid valve is grossly normal. Tricuspid valve regurgitation is mild. Aortic Valve: The aortic valve is tricuspid. There is moderate calcification of the aortic valve. Aortic valve regurgitation is not visualized. Pulmonic Valve: The pulmonic  valve was grossly normal. Pulmonic valve regurgitation is trivial. Aorta: The aortic root is normal in size and structure. Venous: The inferior vena cava is normal in size with greater than 50% respiratory variability, suggesting right atrial pressure of 3 mmHg. IAS/Shunts: No atrial level shunt detected by color flow Doppler.  LEFT VENTRICLE PLAX 2D LVIDd:         4.80 cm      Diastology LVIDs:         4.10 cm      LV e' medial:    3.38 cm/s LV PW:         1.00 cm      LV E/e' medial:  27.0 LV IVS:        1.00 cm      LV e' lateral:   5.03 cm/s LVOT diam:     2.30 cm      LV E/e' lateral: 18.2 LV SV:         84 LV SV Index:   40 LVOT Area:     4.15 cm  LV Volumes (MOD) LV vol d, MOD A2C: 159.0 ml LV vol d, MOD A4C: 109.0 ml LV vol s, MOD A2C: 77.8 ml LV vol s, MOD A4C: 60.8 ml LV SV MOD A2C:     81.2 ml LV SV MOD A4C:     109.0 ml LV SV MOD BP:      65.6 ml RIGHT VENTRICLE RV S prime:     5.56 cm/s TAPSE (M-mode): 1.7 cm LEFT ATRIUM             Index LA diam:        4.20 cm 2.01 cm/m LA Vol (A2C):   31.9 ml 15.26 ml/m LA Vol (A4C):   46.6 ml 22.29 ml/m LA Biplane Vol: 40.4 ml 19.32 ml/m  AORTIC VALVE LVOT Vmax:   97.90 cm/s LVOT Vmean:  64.200 cm/s LVOT VTI:    0.203 m  AORTA Ao Root diam: 3.50 cm MITRAL VALVE MV Area (PHT): 2.73 cm    SHUNTS MV Decel Time: 278 msec    Systemic VTI:  0.20 m MV E velocity: 91.30 cm/s  Systemic Diam: 2.30 cm MV A velocity: 95.10 cm/s MV E/A ratio:  0.96 Rozann Lesches MD Electronically signed by Rozann Lesches MD Signature Date/Time: 12/18/2020/3:34:18 PM    Final     Cardiac Studies    12/18/2020: TTE IMPRESSIONS   1. Left  ventricular ejection fraction, by estimation, is 45 to 50%. The  left ventricle has mildly decreased function. The left ventricle  demonstrates regional wall motion abnormalities (see scoring  diagram/findings for description). Left ventricular  diastolic parameters are indeterminate.   2. Right ventricular systolic function is normal. The right ventricular  size is normal. Tricuspid regurgitation signal is inadequate for assessing  PA pressure.   3. The mitral valve is grossly normal. Trivial mitral valve  regurgitation.   4. The aortic valve is tricuspid. There is moderate calcification of the  aortic valve. Aortic valve regurgitation is not visualized.   5. The inferior vena cava is normal in size with greater than 50%  respiratory variability, suggesting right atrial pressure of 3 mmHg.   LV Wall Scoring:  The mid and distal inferior wall and apical septal segment are  hypokinetic.  The entire anterior wall, entire lateral wall, anterior septum, mid  inferoseptal segment, basal inferior segment, basal inferoseptal segment,  and  apex are normal.     06/22/2020; PCI Mid  LAD lesion is 100% stenosed. Ost LAD to Prox LAD lesion is 60% stenosed with 80% stenosed side branch in 1st Diag. 1st Diag lesion is 85% stenosed. Post intervention, there is a 100% residual stenosis. Mid LM lesion is 85% stenosed. A drug-eluting stent was successfully placed using a SYNERGY [id number]. Post intervention, there is a 0% residual stenosis.   SUMMARY Successful SHOCKWAVE INTRAVASCULAR LITHOTRIPSY based DES PCI of LEFT MAIN using Synergy DES 4.0 mm x 16 mm postdilated to 4.6 mm (based on INTRAVASCULAR ULTRASOUND measurement) -> loss of small caliber 1st Diag due to mid vessel wire dissection @ initial Graft insertion site (too small for PCI). Unfortunately the small caliber first diagonal branch being the most straight shot vessel for wiring did have significant lesions.  There did appear to be a mid  vessel dissection at the 85% lesion.  After IC nitroglycerin, TIMI I-II flow was restored.  With the concern for dissection, I chose not to try to rewire of 1.5 to 2 mm vessel.      05/25/2020: PCI Prox RCA lesion is 30% stenosed. Mid RCA to Dist RCA lesion is 90% stenosed. Dist RCA lesion is 70% stenosed. RPDA lesion is 100% stenosed. RPAV lesion is 70% stenosed. A drug-eluting stent was successfully placed using a [place] [id number]. Post intervention, there is a 0% residual stenosis. Post intervention, there is a 0% residual stenosis.   Successful angioplasty and drug-eluting stent placement to the mid/distal right coronary artery for severe in-stent restenosis as well as significant disease distal to the stented segment. Transient acute closure of the vessel likely due to thrombus formation after predilation.  Significant difficulty advancing equipment beyond the lesion due to tortuosity, previously stented segment and calcifications.  Successful stent placement was performed with the use of telescope catheter.   Recommendations: Dual antiplatelet therapy for at least 1 year and preferably longer. Aggressive treatment of risk factors. Left main PCI was planned but given difficulty with RCA PCI, recommend staging left main PCI to the outpatient setting.    05/23/2020: LHC Conclusions: Severe native coronary artery disease, including 80-90% mid LMCA stenosis, occluded mid LAD, diffusely disease LCx with occlusion of OM1, and 80% in-stent restenosis of the mid/distal RCA followed by 70% distal RCA and rPL lesions, as well as occlusion of the ostial rPDA. Patent LIMA-LAD. Occluded SVG-D2, SVG-OM1, and SVG-rPDA. Upper normal left ventricular filling pressure. Small left radial artery with significant vasospasm.  Recommend alternate access for further catheterizations.   Recommendations: Staged PCI to mid/distal RCA and mid LMCA (likely Wednesday if renal function remains  stable).  Recommend femoral approach, given significant left radial vasospasm with 90F diagnostic catheters. Continue dual antiplatelet therapy with aspirin and clopidogrel for at least 12 months, ideally longer. Aggressive secondary prevention, including escalation of statin therapy.     Patient Profile     60 y.o. female CAD (CABG 0000000 complicted by mediastinitis, s/p myocutaneous flaps, and most recently PCI ri RCA June 2021 and of the LM in July 21> DM, HTN, HLD, morbid obesity, GERDadmitted 12/17/20 was in her usual state of health when she developed sob and chest pressure and presented with a RBBB wide QRS tachycardia which was poorly tolerated. She was treated with IV amio and lidocaine but was then cardioverted while awake back to NSR  Assessment & Plan    1. VT Planned for LHC today, if no new/obstructive disease or interventions will plan for ICD implant Dr. Lovena Le has seen and examined the patient  this morning, revisit the plans and procedures with her She remains agreeable to both.  On amio gtt Will transition to PO pending cath findings procedures today  Mag was 1.3 >>> 1.9 today after replacement  2. CAD     No anginal complaints     HS Trop 2286 last     On ASA, Plavix, BB, statin  3. ICM     40-45%      No exam findings of volume OL      On BB/ACE  4. HTN      No changes   For questions or updates, please contact [facility] Please consult [email or web address] for contact info under     Signed, Sheilah Pigeon, PA-C  12/19/2020, 8:26 AM    EP Attending  Patient seen and examined. Agree with above. The patient looks better today and her  VT is improved. She will undergo ICD insertion.  Sharlot Gowda Essance Gatti,MD

## 2020-12-20 ENCOUNTER — Inpatient Hospital Stay (HOSPITAL_COMMUNITY): Payer: Managed Care, Other (non HMO)

## 2020-12-20 ENCOUNTER — Encounter (HOSPITAL_COMMUNITY): Payer: Self-pay | Admitting: Internal Medicine

## 2020-12-20 DIAGNOSIS — I472 Ventricular tachycardia: Secondary | ICD-10-CM | POA: Diagnosis not present

## 2020-12-20 LAB — CBC
HCT: 34.8 % — ABNORMAL LOW (ref 36.0–46.0)
Hemoglobin: 11.4 g/dL — ABNORMAL LOW (ref 12.0–15.0)
MCH: 28.5 pg (ref 26.0–34.0)
MCHC: 32.8 g/dL (ref 30.0–36.0)
MCV: 87 fL (ref 80.0–100.0)
Platelets: 258 10*3/uL (ref 150–400)
RBC: 4 MIL/uL (ref 3.87–5.11)
RDW: 13.2 % (ref 11.5–15.5)
WBC: 8.7 10*3/uL (ref 4.0–10.5)
nRBC: 0 % (ref 0.0–0.2)

## 2020-12-20 LAB — BASIC METABOLIC PANEL
Anion gap: 10 (ref 5–15)
BUN: 11 mg/dL (ref 6–20)
CO2: 21 mmol/L — ABNORMAL LOW (ref 22–32)
Calcium: 8.5 mg/dL — ABNORMAL LOW (ref 8.9–10.3)
Chloride: 103 mmol/L (ref 98–111)
Creatinine, Ser: 0.79 mg/dL (ref 0.44–1.00)
GFR, Estimated: >60 mL/min (ref >60)
Glucose, Bld: 315 mg/dL — ABNORMAL HIGH (ref 70–99)
Potassium: 4.8 mmol/L (ref 3.5–5.1)
Sodium: 134 mmol/L — ABNORMAL LOW (ref 135–145)

## 2020-12-20 LAB — GLUCOSE, CAPILLARY: Glucose-Capillary: 317 mg/dL — ABNORMAL HIGH (ref 70–99)

## 2020-12-20 LAB — MAGNESIUM: Magnesium: 2 mg/dL (ref 1.7–2.4)

## 2020-12-20 MED ORDER — AMIODARONE HCL 200 MG PO TABS
200.0000 mg | ORAL_TABLET | Freq: Two times a day (BID) | ORAL | Status: DC
  Administered 2020-12-20: 200 mg via ORAL
  Filled 2020-12-20: qty 1

## 2020-12-20 MED ORDER — AMIODARONE HCL 200 MG PO TABS: 200.0000 mg | ORAL_TABLET | Freq: Two times a day (BID) | ORAL | 6 refills | Status: DC

## 2020-12-20 NOTE — Discharge Summary (Addendum)
ELECTROPHYSIOLOGY PROCEDURE DISCHARGE SUMMARY    [name],  MRN: [id number], DOB/AGE: Dec 26, 1960 60 y.o.  Admit date: 12/17/2020 Discharge date: 12/20/2020  Primary Care Physician: Ernestene Kiel, MD  Primary Cardiologist: Dr. [name] Electrophysiologist: Dr. Lovena Le (new)  Primary Discharge Diagnosis:  1. VT  Secondary Discharge Diagnosis:  1. CAD 2. HTN 3. HLD 4. Morbid obesity 5. DM  Allergies  Allergen Reactions   Chlorhexidine Gluconate Itching    Received CHG bath, began itching, required benadryl   Tramadol Other (See Comments)    Hallucinations   Codeine Rash     Procedures This Admission:  1. LHC 12/19/20 2nd Diag lesion is 80% stenosed. Mid LAD lesion is 100% stenosed. Ost LAD to Mid LAD lesion is 50% stenosed. 1st Mrg-1 lesion is 90% stenosed. 1st Mrg-2 lesion is 100% stenosed. Prox RCA lesion is 30% stenosed. RPDA lesion is 100% stenosed. RPAV lesion is 90% stenosed. LIMA graft was not injected. Origin lesion is 100% stenosed. SVG graft was not injected. Origin lesion is 100% stenosed. SVG graft was not injected. Origin lesion is 100% stenosed. LIMA graft was visualized by angiography and is normal in caliber. Mid RCA lesion is 40% stenosed. Previously placed Ost LM to Mid LM stent (unknown type) is widely patent. Ost Cx to Prox Cx lesion is 99% stenosed.   1. Severe triple vessel CAD s/p CABG with patent LIMA graft but 3 known occluded vein grafts 2. Patent left main stent without restenosis.  3. Chronic occlusion of the mid LAD. The mid and distal LAD fills from the patent LIMA graft. The proximal LAD is patent and fills two small caliber diagonal branches. Both diagonal branches have disease but given small caliber of vessels, not favorable for PCI. The vein graft to the diagonal is known to be occluded.  4. The Circumflex is overall small in caliber with diffuse severe proximal disease. The graft to the OM is known to be  occluded.  5. The RCA is a large dominant vessel. The vessel is patent. The mid to distal stented segment is patent. The proximal segment of the stent has mild restenosis. There is a severe stenosis in the small caliber posterolateral artery, unchanged from last cath and too small for PCI. Known occlusion of the SVG to the PDA. The PDA is chronically occluded.    Recommendations: No focal targets for PCI. Continue medical management of CAD. EP to see today to discuss ICD.      2. Implantation of a Abbott dual chamber ICD on 12/19/20 by Dr Lovena Le.  The patient received a St. Jude (serial  Number [id number]) ICD, [employer]. Jude  (serial # [id number]  ) right atrial lead and a [employer]. Jude (serial number [id number]) right ventricular defibrillator lead  DFT's were successful at [place].  There were no immediate post procedure complications. CXR on 12/21/19 demonstrated no pneumothorax status post device implantation.      Brief HPI: Jasmine Richardson is a 60 y.o. female w/PMHx CAD (CABG 0000000 complicted by mediastinitis, s/p myocutaneous flaps, and most recently PCI ri RCA June 2021 and of the LM in July 21> DM, HTN, HLD, morbid obesity, GERDadmitted 12/17/20 was in her usual state of health when she developed sob and chest pressure and presented with a RBBB wide QRS tachycardia which was poorly tolerated. She was treated with IV amio and lidocaine but was then cardioverted while awake back to NSR. She was admitted, started on amiodarone gtt.   Hospital Course:  The  patient was admitted to ICU and maintained on amiodarone gtt, HS were high though with no anginal symptoms and likely demand, not felt to be ACS/NSTEMI, though given her CAD planned for LHC She was found with low mag and this was replaced.  TTE was updated, her LVEF 45-50%, + WMA.  LHC noted no changes in her severe CAD, previously placed stents remained patent with no culprit or target lesions noted.  No intervention was done and planned for ICD which she had  done the same day with details of her procedure above. She was monitored throughout her stay and had no recurrent VT. She was transitioned to PO amiodarone to stay on 200mg  BID until her MD follow up visit. The patient feels well, denies any CP, palpitations or SOB. Her DM is poorly controlled, she states she is working on that with her PMD and eats better and generally better at home. Dr. Lovena Le has recommended no changes to her home regime and to see her PMD for ongoing DM management. The patient has been OOB and ambulated, she feels well, no CP or SOB and would like to go home. IVD site is stable, Dr. Lovena Le has seen and examined the patient this morning and felt to be stable for discharge.  The patient was made aware no driving 44months  Physical Exam: Vitals:   12/20/20 0500 12/20/20 0605 12/20/20 0700 12/20/20 0758  BP: (!) 129/97 139/87 117/78   Pulse: 81 90 82   Resp: 13 20 19    Temp:    97.7 F (36.5 C)  TempSrc:      SpO2: 98% 97% 97%   Weight: 104.3 kg       GEN- The patient is well appearing, alert and oriented x 3 today.   HEENT: normocephalic, atraumatic; sclera clear, conjunctiva pink; hearing intact; oropharynx clear Lungs- CTA b/l, normal work of breathing.  No wheezes, rales, rhonchi Heart- RRR, no murmurs, rubs or gallops, PMI not laterally displaced GI- soft, non-tender, non-distended Extremities- no clubbing, cyanosis, or edema MS- no significant deformity or atrophy Skin- warm and dry, no rash or lesion, left chest without hematoma/ecchymosis Psych- euthymic mood, full affect Neuro- no gross defecits  Labs:   Lab Results  Component Value Date   WBC 8.7 12/20/2020   HGB 11.4 (L) 12/20/2020   HCT 34.8 (L) 12/20/2020   MCV 87.0 12/20/2020   PLT 258 12/20/2020    Recent Labs  Lab 12/20/20 0056  NA 134*  K 4.8  CL 103  CO2 21*  BUN 11  CREATININE 0.79  CALCIUM 8.5*  GLUCOSE 315*    Discharge Medications:  Allergies as of 12/20/2020        Reactions   Chlorhexidine Gluconate Itching   Received CHG bath, began itching, required benadryl   Tramadol Other (See Comments)   Hallucinations   Codeine Rash        Medication List     STOP taking these medications    omeprazole 20 MG capsule Commonly known as: PRILOSEC       TAKE these medications    acetaminophen 500 MG tablet Commonly known as: TYLENOL Take 2 tablets (1,000 mg total) by mouth every 6 (six) hours as needed. What changed: reasons to take this   amiodarone 200 MG tablet Commonly known as: PACERONE Take 1 tablet (200 mg total) by mouth 2 (two) times daily.   aspirin EC 81 MG tablet Take 1 tablet (81 mg total) by mouth daily.   Basaglar KwikPen  100 UNIT/ML Inject 0.2 mLs (20 Units total) into the skin at bedtime. What changed:  how much to take when to take this   cholecalciferol 25 MCG (1000 UNIT) tablet Commonly known as: VITAMIN D3 Take 2,000 Units by mouth every Saturday.   clopidogrel 75 MG tablet Commonly known as: PLAVIX Take 1 tablet (75 mg total) by mouth daily. What changed: when to take this   docusate sodium 100 MG capsule Commonly known as: Colace Take 1 capsule (100 mg total) by mouth daily as needed. What changed: reasons to take this   furosemide 40 MG tablet Commonly known as: LASIX Take 1 tablet by mouth once daily What changed: when to take this   gabapentin 300 MG capsule Commonly known as: NEURONTIN Take 1 capsule (300 mg total) by mouth at bedtime.   glipiZIDE 10 MG 24 hr tablet Commonly known as: GLUCOTROL XL Take 10 mg by mouth 2 (two) times daily.   icosapent Ethyl 1 g capsule Commonly known as: Vascepa Take 2 capsules (2 g total) by mouth 2 (two) times daily. What changed:  how much to take when to take this   lisinopril 20 MG tablet Commonly known as: ZESTRIL Take 1 tablet (20 mg total) by mouth daily.   LORazepam 1 MG tablet Commonly known as: ATIVAN Take 1 mg by mouth daily as needed for  anxiety.   metFORMIN 500 MG 24 hr tablet Commonly known as: GLUCOPHAGE-XR Take 1,000 mg by mouth 2 (two) times daily.   metoprolol succinate 50 MG 24 hr tablet Commonly known as: TOPROL-XL Take 50 mg by mouth 3 (three) times daily.   nitroGLYCERIN 0.4 MG SL tablet Commonly known as: NITROSTAT Place 1 tablet (0.4 mg total) under the tongue every 5 (five) minutes x 3 doses as needed for chest pain.   nystatin cream Commonly known as: MYCOSTATIN Apply 1 application topically 2 (two) times daily as needed for dry skin (rash).   pantoprazole 40 MG tablet Commonly known as: PROTONIX Take 1 tablet (40 mg total) by mouth daily.   pravastatin 20 MG tablet Commonly known as: PRAVACHOL Take 20 mg by mouth at bedtime.   VITAMIN B 12 PO Take 1 tablet by mouth at bedtime.   vitamin C 250 MG tablet Commonly known as: ASCORBIC ACID Take 250 mg by mouth at bedtime.        Disposition: Home Discharge Instructions     Diet - low sodium heart healthy   Complete by: As directed    Increase activity slowly   Complete by: As directed        Follow-up Information     [place] [facility] Follow up.   Specialty: Cardiology Why: 12/29/2020 @ 2:40PM, wound check visit Contact information: [address] [place]        Evans Lance, MD Follow up.   Specialty: Cardiology Why: 03/21/21 @ 2:00PM Contact information: [address] [address] [phone]         Richardo Priest, MD Follow up.   Specialty: Cardiology Why: 01/11/21 @ 8:20AM Contact information: [place] [address] [phone]                 Duration of Discharge Encounter: Greater than 30 minutes including physician time.  Venetia Night, PA-C 12/20/2020 10:37 AM  EP Attending  Patient seen and examined. Agree with the findings as noted above. The patient  is doing well after  left heart cath and then ICD insertion. Her device has been evaluated and found to be functioning normally. Her cxr looks good. She will be discharged home with the usual followup.   Sharlot Gowda Ebonye Reade,MD

## 2020-12-20 NOTE — Progress Notes (Signed)
Discharge instructions (including medications) discussed with and copy provided to patient/caregiver 

## 2020-12-20 NOTE — Discharge Instructions (Signed)
° ° °  Supplemental Discharge Instructions for  Pacemaker/Defibrillator Patients   Activity No heavy lifting or vigorous activity with your left/right arm for 6 to 8 weeks.  Do not raise your left/right arm above your head for one week.  Gradually raise your affected arm as drawn below.             12/23/20                       12/24/20                      12/25/20                     12/26/20 __  NO DRIVING for  6 months .  WOUND CARE - Keep the wound area clean and dry.  Do not get this area wet , no showers for one week; you may shower on  12/26/20, please do not let water spray directly on wound area   . - The tape/steri-strips on your wound will fall off; do not pull them off.  No bandage is needed on the site.  DO  NOT apply any creams, oils, or ointments to the wound area. - If you notice any drainage or discharge from the wound, any swelling or bruising at the site, or you develop a fever > 101? F after you are discharged home, call the office at once.  Special Instructions - You are still able to use cellular telephones; use the ear opposite the side where you have your pacemaker/defibrillator.  Avoid carrying your cellular phone near your device. - When traveling through airports, show security personnel your identification card to avoid being screened in the metal detectors.  Ask the security personnel to use the hand wand. - Avoid arc welding equipment, MRI testing (magnetic resonance imaging), TENS units (transcutaneous nerve stimulators).  Call the office for questions about other devices. - Avoid electrical appliances that are in poor condition or are not properly grounded. - Microwave ovens are safe to be near or to operate.  Additional information for defibrillator patients should your device go off: - If your device goes off ONCE and you feel fine afterward, notify the device clinic nurses. - If your device goes off ONCE and you do not feel well afterward, call 911. - If your  device goes off TWICE, call 911. - If your device goes off THREE times in one day, call 911.  DO NOT DRIVE YOURSELF OR A FAMILY MEMBER WITH A DEFIBRILLATOR TO THE HOSPITAL--CALL 911.

## 2020-12-20 NOTE — Plan of Care (Signed)
  Problem: Education: Goal: Knowledge of General Education information will improve Description: Including pain rating scale, medication(s)/side effects and non-pharmacologic comfort measures Outcome: Adequate for Discharge   

## 2020-12-20 NOTE — Progress Notes (Signed)
Inpatient Diabetes Program Recommendations  AACE/ADA: New Consensus Statement on Inpatient Glycemic Control (2015)  Target Ranges:  Prepandial:   less than 140 mg/dL      Peak postprandial:   less than 180 mg/dL (1-2 hours)      Critically ill patients:  140 - 180 mg/dL   Lab Results  Component Value Date   GLUCAP 317 (H) 12/20/2020   HGBA1C 11.2 (H) 12/18/2020    Review of Glycemic Control Results for Jasmine Richardson, Jasmine Richardson (MRN [id number]) as of 12/20/2020 11:30  Ref. Range 12/19/2020 15:48 12/19/2020 21:30 12/20/2020 06:38  Glucose-Capillary Latest Ref Range: 70 - 99 mg/dL 259 (H) 563 (H) 875 (H)   Diabetes history: Type 2 DM Outpatient Diabetes medications: Lantus 15 units QD, Glipizide 10 mg BID, Metformin 1000 mg BID Current orders for Inpatient glycemic control: Lantus 15 units QD, Novolog 0-20 units TID, Novolog 0-5 units QHS  Inpatient Diabetes Program Recommendations:    Spoke with patient regarding outpatient diabetes management. Patient usually runs in the 200-300's mg/dL. Varies home medications; denies missing doses. Has not been taking Januvia due to cost (>$400?).  Reviewed patient's current A1c of 11.2 %. Explained what a A1c is and what it measures. Also reviewed goal A1c with patient, importance of good glucose control @ home, and blood sugar goals. Reviewed patho of DM, role of pancreas, differences between short acting vs long acting insulin, current trends, need for increase dosages of insulin, vascular changes, impact from cardiac perspective and other comorbidities.  Patient has a meter and supplies. Reviewed recommend frequency, when to follow up with PCP, and target goals. Patient plans to schedule a PCP appointment within week following our conversation. Discussed discharge medications and need for increased insulin dosages. Anticipate need for further titration in outpatient setting. Patient expresses understanding. Encouraged post prandial checks. Patient unable to afford  outpatient endocrinology. Patient denies drinking sugary beverages. Patient states, "Many times I will go without a meal based on my blood sugar being so high." Reviewed carb allotment per day, plate method and counting carbohydrates.   Thanks, Lujean Rave, MSN, RNC-OB Diabetes Coordinator [phone] (8a-5p)

## 2020-12-21 ENCOUNTER — Other Ambulatory Visit: Payer: Self-pay | Admitting: Cardiology

## 2020-12-21 DIAGNOSIS — I25119 Atherosclerotic heart disease of native coronary artery with unspecified angina pectoris: Secondary | ICD-10-CM

## 2020-12-21 DIAGNOSIS — I110 Hypertensive heart disease with heart failure: Secondary | ICD-10-CM

## 2020-12-21 DIAGNOSIS — E785 Hyperlipidemia, unspecified: Secondary | ICD-10-CM

## 2020-12-21 DIAGNOSIS — I5032 Chronic diastolic (congestive) heart failure: Secondary | ICD-10-CM

## 2020-12-22 ENCOUNTER — Other Ambulatory Visit: Payer: Self-pay | Admitting: *Deleted

## 2020-12-22 NOTE — Patient Outreach (Signed)
[facility]) Care Management  12/22/2020  Jasmine Richardson 03-Aug-1961 [id number]  Referral received 12/20/2020 Initial outreach call  Transition of care  RN attempted outreach call however unsuccessful. RN left a HIPAA approved voice message requesting a call back.   Will further engage at that time, send out an outreach letter and attempted another outreach call over the next 4 days.  Elliot Cousin, RN Care Management Coordinator [facility] Main Office [phone]

## 2020-12-28 ENCOUNTER — Other Ambulatory Visit: Payer: Self-pay | Admitting: *Deleted

## 2020-12-28 ENCOUNTER — Other Ambulatory Visit: Payer: Self-pay | Admitting: Cardiology

## 2020-12-28 DIAGNOSIS — E785 Hyperlipidemia, unspecified: Secondary | ICD-10-CM

## 2020-12-28 DIAGNOSIS — I25119 Atherosclerotic heart disease of native coronary artery with unspecified angina pectoris: Secondary | ICD-10-CM

## 2020-12-28 DIAGNOSIS — I5032 Chronic diastolic (congestive) heart failure: Secondary | ICD-10-CM

## 2020-12-28 DIAGNOSIS — I110 Hypertensive heart disease with heart failure: Secondary | ICD-10-CM

## 2020-12-28 NOTE — Patient Outreach (Addendum)
[facility]) Care Management  12/28/2020  Jasmine Richardson 28-Jan-1961 [id number]   Transition of care-Unsuccessful Outreach #2  RN spoke with pt today and introduced [facility]. RN inquired if this was a good time to speak with her however pt requested a call back later today.   Will call back and attempt to further engage at that time.  Jasmine Mina, RN Care Management Coordinator [place] Office [phone]

## 2020-12-28 NOTE — Patient Outreach (Signed)
[facility]) Care Management  12/28/2020  Iva Montelongo May 11, 1961 [id number]  Returned requested call back Transition of Care  Outreach #3 RN spoke with pt and introduced [facility] services. Explained the purpose for today's call and extended services. Pt appreciative however opt ot decline at this time. In discussing pt's diabetes, pt states she has reduced her A1c from 15 to 11.2 and continue to do well with reducing this number. RN offered to assist with available resources and/or tools for daily monitoring. Pt declined indicating she and her provider have agreed to changes in her medication and her readings continue to decrease. States she will follow up next month with a new A1c and will view that number to see if it has reduced. Pt receptive at this time to information mail concerning [facility] services at this time.  Offered to follow up next month however pt only agreed to information packet and will consider if her A1c has not improved over the next month.   Will send information packet and alert pt's provider of the pt's disposition with [facility] services at this time.  Raina Mina, RN Care Management Coordinator [place] Office [phone]

## 2020-12-29 ENCOUNTER — Ambulatory Visit: Payer: Managed Care, Other (non HMO)

## 2021-01-02 ENCOUNTER — Other Ambulatory Visit: Payer: Self-pay | Admitting: Cardiology

## 2021-01-04 ENCOUNTER — Ambulatory Visit: Payer: Commercial Managed Care - PPO | Admitting: Sports Medicine

## 2021-01-08 NOTE — Interval H&P Note (Signed)
History and Physical Interval Note:  01/08/2021 9:07 PM  Jasmine Richardson  has presented today for surgery, with the diagnosis of vt.  The various methods of treatment have been discussed with the patient and family. After consideration of risks, benefits and other options for treatment, the patient has consented to  Procedure(s): ICD IMPLANT (N/A) as a surgical intervention.  The patient's history has been reviewed, patient examined, no change in status, stable for surgery.  I have reviewed the patient's chart and labs.  Questions were answered to the patient's satisfaction.    She will undergo a DDD ICD due to her VT in order to more easily discriminate VT from SVT.    Cristopher Peru

## 2021-01-10 ENCOUNTER — Other Ambulatory Visit: Payer: Self-pay

## 2021-01-10 ENCOUNTER — Ambulatory Visit (INDEPENDENT_AMBULATORY_CARE_PROVIDER_SITE_OTHER): Payer: Managed Care, Other (non HMO) | Admitting: Emergency Medicine

## 2021-01-10 DIAGNOSIS — I472 Ventricular tachycardia, unspecified: Secondary | ICD-10-CM

## 2021-01-10 LAB — CUP PACEART INCLINIC DEVICE CHECK
Battery Remaining Longevity: 106 mo
Brady Statistic RA Percent Paced: 0 %
Brady Statistic RV Percent Paced: 0 %
Date Time Interrogation Session: 20220125171221
HighPow Impedance: 57.375
Implantable Lead Implant Date: 20220103
Implantable Lead Implant Date: 20220103
Implantable Lead Location: 753859
Implantable Lead Location: 753860
Implantable Pulse Generator Implant Date: 20220103
Lead Channel Impedance Value: 400 Ohm
Lead Channel Impedance Value: 462.5 Ohm
Lead Channel Pacing Threshold Amplitude: 0.5 V
Lead Channel Pacing Threshold Amplitude: 0.5 V
Lead Channel Pacing Threshold Amplitude: 0.5 V
Lead Channel Pacing Threshold Amplitude: 0.5 V
Lead Channel Pacing Threshold Pulse Width: 0.5 ms
Lead Channel Pacing Threshold Pulse Width: 0.5 ms
Lead Channel Pacing Threshold Pulse Width: 0.5 ms
Lead Channel Pacing Threshold Pulse Width: 0.5 ms
Lead Channel Sensing Intrinsic Amplitude: 12 mV
Lead Channel Sensing Intrinsic Amplitude: 5 mV
Lead Channel Setting Pacing Amplitude: 3.5 V
Lead Channel Setting Pacing Amplitude: 3.5 V
Lead Channel Setting Pacing Pulse Width: 0.5 ms
Lead Channel Setting Sensing Sensitivity: 0.5 mV
Pulse Gen Serial Number: 111037319

## 2021-01-10 NOTE — Progress Notes (Signed)
Wound check appointment. Steri-strips removed. Wound without redness or edema. Incision edges approximated, wound well healed. Normal device function. Thresholds, sensing, and impedances consistent with implant measurements. Device programmed at 3.5V for extra safety margin until 3 month visit. Histogram distribution appropriate for patient and level of activity. No mode switches or ventricular arrhythmias noted. Patient educated about wound care, arm mobility, lifting restrictions, shock plan. Patient enrolled in remote monitoring, next scheduled check 03/21/21, ROV with Dr. Lovena Le on 03/21/21.

## 2021-01-10 NOTE — Progress Notes (Signed)
Cardiology Office Note:    Date:  01/11/2021   ID:  Jasmine Richardson, DOB 1961/02/28, MRN [id number]  PCP:  Ernestene Kiel, MD  Cardiologist:  Shirlee More, MD    Referring MD: Ernestene Kiel, MD    ASSESSMENT:    1. Ventricular tachycardia ([place])   2. ICD (implantable cardioverter-defibrillator) in place   3. Coronary artery disease involving native coronary artery of native heart with angina pectoris ([place])   4. Hypertensive heart disease with chronic diastolic congestive heart failure ([place])   5. Mixed hyperlipidemia   6. On amiodarone therapy    PLAN:    In order of problems listed above:  1. Continue amiodarone check thyroid and repeat every month the first 3 months 2. Stable function followed in device clinic 3. Stable CAD on recent coronary angiography having no angina no further targets for percutaneous revascularization and she'll continue her current medical treatment with dual antiplatelet and lipid-lowering 4. Stable heart failure compensated continue her loop diuretic check potassium proBNP, she does have a cough and will transition ARB from ACE inhibitor orthostatic shows blood pressure 126/80 standing. 5. Continue her statin 6. Check labs today and will do thyroids every month for the first 3 months   Next appointment: 6 weeks   Medication Adjustments/Labs and Tests Ordered: Current medicines are reviewed at length with the patient today.  Concerns regarding medicines are outlined above.  No orders of the defined types were placed in this encounter.  No orders of the defined types were placed in this encounter.   Chief complaint: Hospital follow-up  History of Present Illness:    Jasmine Richardson is a 60 y.o. female with a hx of CAD CABG 2017 with postoperative mediastinitis reoperation at [facility] with myocutaneous pectoral flaps for closure diastolic heart failure type 2 diabetes hypertension and hyperlipidemia last seen  08/08/2020.  She presented with ACS and PCI and stent native right coronary artery 05/25/2020 with occlusion of the saphenous vein graft.  Following this a complex PCI and stent to left main coronary artery with shockwave intravascular lithotripsy 06/22/2020 with dissection occlusion of a small caliber first diagonal branch.  Compliance with diet, lifestyle and medications: Yes  Overall doing well in good spirits no side effects of amiodarone no edema shortness of breath chest pain palpitation or syncope. She has had some dizziness we'll check orthostatic blood pressures today.  Recent admitted to the hospital 12/17/2020 with wide complex tachycardia, and ventricular tachycardia treated with IV amiodarone and lidocaine but subsequently cardioverted back to sinus rhythm.  She was maintained and loaded with amiodarone.  Repeat echocardiogram showed ejection fraction 45 to 50% left heart catheterization was performed and there is no change in previous severe CAD and recent stents were patent and there was no culprit or target lesion for intervention.  Ventricular tachycardia and had an ICD placed.  She was seen by EP yesterday for ICD implantation follow-up healing well the device showed normal functioning threshold sensing and impedance and she had no recurrent episodes of ventricular tachycardia  Left heart catheter 103 2022: Procedures  LEFT HEART CATH AND CORS/GRAFTS ANGIOGRAPHY    Conclusion    2nd Diag lesion is 80% stenosed.  Mid LAD lesion is 100% stenosed.  Ost LAD to Mid LAD lesion is 50% stenosed.  1st Mrg-1 lesion is 90% stenosed.  1st Mrg-2 lesion is 100% stenosed.  Prox RCA lesion is 30% stenosed.  RPDA lesion is 100% stenosed.  RPAV lesion is 90% stenosed.  LIMA graft was not injected.  Origin lesion is 100% stenosed.  SVG graft was not injected.  Origin lesion is 100% stenosed.  SVG graft was not injected.  Origin lesion is 100% stenosed.  LIMA graft was  visualized by angiography and is normal in caliber.  Mid RCA lesion is 40% stenosed.  Previously placed Ost LM to Mid LM stent (unknown type) is widely patent.  Ost Cx to Prox Cx lesion is 99% stenosed.   1. Severe triple vessel CAD s/p CABG with patent LIMA graft but 3 known occluded vein grafts 2. Patent left main stent without restenosis.  3. Chronic occlusion of the mid LAD. The mid and distal LAD fills from the patent LIMA graft. The proximal LAD is patent and fills two small caliber diagonal branches. Both diagonal branches have disease but given small caliber of vessels, not favorable for PCI. The vein graft to the diagonal is known to be occluded.  4. The Circumflex is overall small in caliber with diffuse severe proximal disease. The graft to the OM is known to be occluded.  5. The RCA is a large dominant vessel. The vessel is patent. The mid to distal stented segment is patent. The proximal segment of the stent has mild restenosis. There is a severe stenosis in the small caliber posterolateral artery, unchanged from last cath and too small for PCI. Known occlusion of the SVG to the PDA. The PDA is chronically occluded.   Recommendations: No focal targets for PCI. Continue medical management of CAD. EP to see today to discuss ICD.   Echocardiogram 12/18/2020: 1. Left ventricular ejection fraction, by estimation, is 45 to 50%. The  left ventricle has mildly decreased function. The left ventricle  demonstrates regional wall motion abnormalities (see scoring  diagram/findings for description). Left ventricular  diastolic parameters are indeterminate.  2. Right ventricular systolic function is normal. The right ventricular  size is normal. Tricuspid regurgitation signal is inadequate for assessing  PA pressure.  3. The mitral valve is grossly normal. Trivial mitral valve  regurgitation.  4. The aortic valve is tricuspid. There is moderate calcification of the  aortic valve.  Aortic valve regurgitation is not visualized.  5. The inferior vena cava is normal in size with greater than 50%  respiratory variability, suggesting right atrial pressure of 3 mmHg.   Past Medical History:  Diagnosis Date  . Acute chest pain 05/08/2016  . Angina pectoris ([place]) 09/11/2017  . [place] 05/08/2016  . Chronic diastolic heart failure ([place]) 11/06/2016  . Coronary artery disease involving native coronary artery of native heart with angina pectoris ([place]) 09/12/2015   Overview:  PCI and stent of RCA 2009, last cath 2012 with medical therapy  CABG May 2017  . Emphysema lung ([place]) 09/11/2017  . Essential hypertension 09/12/2015  . GERD (gastroesophageal reflux disease) 05/08/2016  . Hyperlipidemia 09/12/2015  . Mediastinitis 06/28/2016  . Morbid obesity ([place]) 05/08/2016  . S/P CABG (coronary artery bypass graft) 06/03/2016   Overview:  The patient underwent sternal reconstruction on 06/21/16 with pec flaps for mediastinitis from a prior CABG in May 2017. On admission, she was critically ill from sepsis and had altered mental status. She was last seen in clinic on 08/09/16 at which time she was doing well.  . Severe sepsis ([place]) 06/03/2016  . Sinus tachycardia 05/08/2016  . Tobacco use disorder 04/20/2016   Overview:  Quit in May 2017.  [name] Uncontrolled type 2 diabetes mellitus ([place]) 05/08/2016  . Wound, surgical, infected 06/06/2016  Overview:  sternal    Past Surgical History:  Procedure Laterality Date  . AMPUTATION TOE Right 02/20/2020   Procedure: AMPUTATION RIGHT GREAT  TOE;  Surgeon: Felipa Furnace, DPM;  Location: [place];  Service: Podiatry;  Laterality: Right;  . BLADDER SURGERY    . CARDIAC CATHETERIZATION    . CORONARY ARTERY BYPASS GRAFT    . CORONARY STENT INTERVENTION N/A 05/25/2020   Procedure: CORONARY STENT INTERVENTION;  Surgeon: Wellington Hampshire, MD;  Location: [place] [facility];  Service: Cardiovascular;  Laterality: N/A;  . CORONARY STENT INTERVENTION N/A 06/22/2020    Procedure: CORONARY STENT INTERVENTION;  Surgeon: Leonie Man, MD;  Location: [place] [facility];  Service: Cardiovascular;  Laterality: N/A;  . ICD IMPLANT N/A 12/19/2020   Procedure: ICD IMPLANT;  Surgeon: Evans Lance, MD;  Location: [place] [facility];  Service: Cardiovascular;  Laterality: N/A;  . INCISION AND DRAINAGE Right 02/19/2020   Procedure: INCISION AND DRAINAGE;  Surgeon: Trula Slade, DPM;  Location: [place];  Service: Podiatry;  Laterality: Right;  Block done by surgeon  . INTRAVASCULAR ULTRASOUND/IVUS N/A 06/22/2020   Procedure: Intravascular Ultrasound/IVUS;  Surgeon: Leonie Man, MD;  Location: [place] [facility];  Service: Cardiovascular;  Laterality: N/A;  . LEFT HEART CATH AND CORS/GRAFTS ANGIOGRAPHY N/A 05/23/2020   Procedure: LEFT HEART CATH AND CORS/GRAFTS ANGIOGRAPHY;  Surgeon: Nelva Bush, MD;  Location: [place] [facility];  Service: Cardiovascular;  Laterality: N/A;  . LEFT HEART CATH AND CORS/GRAFTS ANGIOGRAPHY N/A 12/19/2020   Procedure: LEFT HEART CATH AND CORS/GRAFTS ANGIOGRAPHY;  Surgeon: Burnell Blanks, MD;  Location: [place] [facility];  Service: Cardiovascular;  Laterality: N/A;  . METATARSAL HEAD EXCISION Right 05/02/2020   Procedure: FIRST METATARSAL HEAD RESECTION; RIGHT FOOT WOUND CLOSURE;  Surgeon: Landis Martins, DPM;  Location: [place];  Service: Podiatry;  Laterality: Right;  MAC W/LOCAL  . TUBAL LIGATION      Current Medications: No outpatient medications have been marked as taking for the 01/11/21 encounter (Appointment) with Richardo Priest, MD.     Allergies:   Chlorhexidine gluconate, Tramadol, and Codeine   Social History   Socioeconomic History  . Marital status: Married    Spouse name: Not on file  . Number of children: Not on file  . Years of education: Not on file  . Highest education level: Not on file  Occupational History  . Not on file  Tobacco Use  . Smoking status: Former Smoker    Quit  date: 05/2016    Years since quitting: 4.6  . Smokeless tobacco: Never Used  Vaping Use  . Vaping Use: Never used  Substance and Sexual Activity  . Alcohol use: No  . Drug use: No  . Sexual activity: Yes  Other Topics Concern  . Not on file  Social History Narrative  . Not on file   Social Determinants of Health   Financial Resource Strain: Not on file  Food Insecurity: Not on file  Transportation Needs: Not on file  Physical Activity: Not on file  Stress: Not on file  Social Connections: Not on file     Family History: The patient's family history includes Alzheimer's disease in her father; Heart attack in her father; Heart disease in her father; Hyperlipidemia in her brother and mother; Hypertension in her brother, father, and mother. ROS:   Please see the history of present illness.    All other systems reviewed and are  negative.  EKGs/Labs/Other Studies Reviewed:    The following studies were reviewed today:   Recent Labs: 02/16/2020: ALT 11 05/22/2020: TSH 0.961 12/18/2020: B Natriuretic Peptide 113.2 12/20/2020: BUN 11; Creatinine, Ser 0.79; Hemoglobin 11.4; Magnesium 2.0; Platelets 258; Potassium 4.8; Sodium 134  Recent Lipid Panel    Component Value Date/Time   CHOL 208 (H) 05/22/2020 2122   TRIG 559 (H) 05/22/2020 2122   HDL 37 (L) 05/22/2020 2122   CHOLHDL 5.6 05/22/2020 2122   VLDL UNABLE TO CALCULATE IF TRIGLYCERIDE OVER 400 mg/dL 05/22/2020 2122   LDLCALC UNABLE TO CALCULATE IF TRIGLYCERIDE OVER 400 mg/dL 05/22/2020 2122   LDLDIRECT 91.4 05/22/2020 2122    Physical Exam:    VS:  LMP  (LMP Unknown)     Wt Readings from Last 3 Encounters:  12/20/20 229 lb 15 oz (104.3 kg)  08/08/20 233 lb (105.7 kg)  06/23/20 226 lb 6.4 oz (102.7 kg)     GEN:  Well nourished, well developed in no acute distress HEENT: Normal NECK: No JVD; No carotid bruits LYMPHATICS: No lymphadenopathy CARDIAC: RRR, no murmurs, rubs, gallops RESPIRATORY:  Clear to auscultation  without rales, wheezing or rhonchi  ABDOMEN: Soft, non-tender, non-distended MUSCULOSKELETAL:  No edema; No deformity  SKIN: Warm and dry NEUROLOGIC:  Alert and oriented x 3 PSYCHIATRIC:  Normal affect    Signed, Shirlee More, MD  01/11/2021 8:05 AM    [place]

## 2021-01-11 ENCOUNTER — Ambulatory Visit: Payer: Managed Care, Other (non HMO) | Admitting: Cardiology

## 2021-01-11 ENCOUNTER — Encounter: Payer: Self-pay | Admitting: Cardiology

## 2021-01-11 VITALS — BP 148/84 | HR 76 | Ht 67.0 in | Wt 230.8 lb

## 2021-01-11 DIAGNOSIS — I472 Ventricular tachycardia, unspecified: Secondary | ICD-10-CM

## 2021-01-11 DIAGNOSIS — I25119 Atherosclerotic heart disease of native coronary artery with unspecified angina pectoris: Secondary | ICD-10-CM

## 2021-01-11 DIAGNOSIS — Z95810 Presence of automatic (implantable) cardiac defibrillator: Secondary | ICD-10-CM | POA: Diagnosis not present

## 2021-01-11 DIAGNOSIS — E782 Mixed hyperlipidemia: Secondary | ICD-10-CM

## 2021-01-11 DIAGNOSIS — I110 Hypertensive heart disease with heart failure: Secondary | ICD-10-CM

## 2021-01-11 DIAGNOSIS — Z79899 Other long term (current) drug therapy: Secondary | ICD-10-CM

## 2021-01-11 DIAGNOSIS — I5032 Chronic diastolic (congestive) heart failure: Secondary | ICD-10-CM

## 2021-01-11 MED ORDER — VALSARTAN 80 MG PO TABS: 80.0000 mg | ORAL_TABLET | Freq: Two times a day (BID) | ORAL | 3 refills | Status: DC

## 2021-01-11 NOTE — Patient Instructions (Addendum)
Medication Instructions:  Your physician has recommended you make the following change in your medication:  STOP: Lisinopril START: Valsartan 80 mg take one tablet by mouth twice daily.   *If you need a refill on your cardiac medications before your next appointment, please call your pharmacy*   Lab Work: Your physician recommends that you return for lab work in: TODAY CMP, ProBNP, TSH, T3, T4  We would like for you to come in once a month for the next three months to check your thyroid levels while you are on Amiodarone.   If you have labs (blood work) drawn today and your tests are completely normal, you will receive your results only by: [name] MyChart Message (if you have MyChart) OR . A paper copy in the mail If you have any lab test that is abnormal or we need to change your treatment, we will call you to review the results.   Testing/Procedures: None   Follow-Up: At [facility], you and your health needs are our priority.  As part of our continuing mission to provide you with exceptional heart care, we have created designated Provider Care Teams.  These Care Teams include your primary Cardiologist (physician) and Advanced Practice Providers (APPs -  Physician Assistants and Nurse Practitioners) who all work together to provide you with the care you need, when you need it.  We recommend signing up for the patient portal called "MyChart".  Sign up information is provided on this After Visit Summary.  MyChart is used to connect with patients for Virtual Visits (Telemedicine).  Patients are able to view lab/test results, encounter notes, upcoming appointments, etc.  Non-urgent messages can be sent to your provider as well.   To learn more about what you can do with MyChart, go to [email or web address].    Your next appointment:   6 weeks  The format for your next appointment:   In Person  Provider:   Shirlee More, MD   Other Instructions

## 2021-01-13 ENCOUNTER — Telehealth: Payer: Self-pay

## 2021-01-13 LAB — COMPREHENSIVE METABOLIC PANEL
ALT: 17 IU/L (ref 0–32)
AST: 19 IU/L (ref 0–40)
Albumin/Globulin Ratio: 1.3 (ref 1.2–2.2)
Albumin: 4.3 g/dL (ref 3.8–4.9)
Alkaline Phosphatase: 62 IU/L (ref 44–121)
BUN/Creatinine Ratio: 20 (ref 12–28)
BUN: 21 mg/dL (ref 8–27)
Bilirubin Total: 0.4 mg/dL (ref 0.0–1.2)
CO2: 25 mmol/L (ref 20–29)
Calcium: 10.1 mg/dL (ref 8.7–10.3)
Chloride: 98 mmol/L (ref 96–106)
Creatinine, Ser: 1.04 mg/dL — ABNORMAL HIGH (ref 0.57–1.00)
GFR calc Af Amer: 67 mL/min/{1.73_m2} (ref >59)
GFR calc non Af Amer: 59 mL/min/{1.73_m2} — ABNORMAL LOW (ref >59)
Globulin, Total: 3.4 g/dL (ref 1.5–4.5)
Glucose: 219 mg/dL — ABNORMAL HIGH (ref 65–99)
Potassium: 5.3 mmol/L — ABNORMAL HIGH (ref 3.5–5.2)
Sodium: 138 mmol/L (ref 134–144)
Total Protein: 7.7 g/dL (ref 6.0–8.5)

## 2021-01-13 LAB — PRO B NATRIURETIC PEPTIDE: NT-Pro BNP: 246 pg/mL (ref 0–287)

## 2021-01-13 LAB — TSH+T4F+T3FREE
Free T4: 1.63 ng/dL (ref 0.82–1.77)
T3, Free: 2.7 pg/mL (ref 2.0–4.4)
TSH: 2.79 u[IU]/mL (ref 0.450–4.500)

## 2021-01-13 NOTE — Telephone Encounter (Signed)
Left message on patients voicemail to please return our call.   

## 2021-01-13 NOTE — Telephone Encounter (Signed)
-----   Message from Brian J Munley, MD sent at 01/13/2021  7:55 AM EST ----- Good results including thyroid liver kidney function  her potassium is borderline high recheck BMP in 1 month 

## 2021-01-16 ENCOUNTER — Telehealth: Payer: Self-pay

## 2021-01-16 DIAGNOSIS — I10 Essential (primary) hypertension: Secondary | ICD-10-CM

## 2021-01-16 NOTE — Telephone Encounter (Signed)
-----   Message from Richardo Priest, MD sent at 01/13/2021  7:55 AM EST ----- Good results including thyroid liver kidney function  her potassium is borderline high recheck BMP in 1 month

## 2021-01-16 NOTE — Telephone Encounter (Signed)
Spoke with patient regarding results and recommendation.  Patient verbalizes understanding and is agreeable to plan of care. Advised patient to call back with any issues or concerns.  

## 2021-01-18 ENCOUNTER — Telehealth: Payer: Self-pay

## 2021-01-18 NOTE — Telephone Encounter (Signed)
Attempted to reach patient, it appeared someone answered the phone but I was unable to hear them.  Will try again later.

## 2021-01-18 NOTE — Telephone Encounter (Signed)
-----   Message from Evans Lance, MD sent at 01/16/2021  8:35 PM EST ----- [place] Risser, state law states that a patient with sustained VT wait for 6 months before being allowed to resume driving. GT ----- Message ----- From: York Ram, RN Sent: 01/10/2021   5:17 PM EST To: Evans Lance, MD  Dr. Lovena Le, this patient was in clinic today for her wound check.  She was asking if she is clear to drive.  She had dual chamber ICD implanted on 12/19/20 for VT.  She has not received any treatment from her device.  Please advise if she is clear to drive.    Myrtie Leuthold

## 2021-01-20 ENCOUNTER — Ambulatory Visit (INDEPENDENT_AMBULATORY_CARE_PROVIDER_SITE_OTHER): Payer: Self-pay | Admitting: Sports Medicine

## 2021-01-20 ENCOUNTER — Encounter: Payer: Self-pay | Admitting: Sports Medicine

## 2021-01-20 ENCOUNTER — Other Ambulatory Visit: Payer: Self-pay | Admitting: Cardiology

## 2021-01-20 ENCOUNTER — Other Ambulatory Visit: Payer: Self-pay

## 2021-01-20 DIAGNOSIS — E1165 Type 2 diabetes mellitus with hyperglycemia: Secondary | ICD-10-CM

## 2021-01-20 DIAGNOSIS — Z89419 Acquired absence of unspecified great toe: Secondary | ICD-10-CM

## 2021-01-20 DIAGNOSIS — R234 Changes in skin texture: Secondary | ICD-10-CM

## 2021-01-20 NOTE — Telephone Encounter (Signed)
LMOM to call device clinic, # and office hours provided.

## 2021-01-20 NOTE — Telephone Encounter (Signed)
Patient notified that due to VT episodes 12/17/20 that [facility] DMV regulations are that she not drive for 6 months from the date of treatment for VT. Patient verbalized understanding of driving restrictions.

## 2021-01-20 NOTE — Progress Notes (Signed)
Subjective: Jasmine Richardson is a 60 y.o. female patient with history of diabetes who presents to office today with concerns of cracking skin to her toes on left and heel. Reports that its getting better she has been using some Vaseline and wipes from hospital.  Denies constitutional symptoms or any acute signs of infection.  FBS unknown A1c 9 PCP 3 months ago  Review of Systems  All other systems reviewed and are negative.   Patient Active Problem List   Diagnosis Date Noted  . Ventricular tachycardia ([place]) 12/17/2020  . Unstable angina ([place]) 05/22/2020  . NSTEMI (non-ST elevated myocardial infarction) ([place]) 05/22/2020  . Diabetic foot ulcer ([place]) 02/16/2020  . Angina pectoris ([place]) 09/11/2017  . Emphysema lung ([place]) 09/11/2017  . Chronic diastolic heart failure ([place]) 11/06/2016  . Mediastinitis 06/28/2016  . Wound, surgical, infected 06/06/2016  . S/P CABG (coronary artery bypass graft) 06/03/2016  . Severe sepsis ([place]) 06/03/2016  . GERD (gastroesophageal reflux disease) 05/08/2016  . Morbid obesity ([place]) 05/08/2016  . Uncontrolled type 2 diabetes mellitus ([place]) 05/08/2016  . Sinus tachycardia 05/08/2016  . Acute chest pain 05/08/2016  . Burping 05/08/2016  . Tobacco use disorder 04/20/2016  . Coronary artery disease involving native coronary artery of native heart with angina pectoris ([place]) 09/12/2015  . Essential hypertension 09/12/2015  . Hyperlipidemia 09/12/2015   Current Outpatient Medications on File Prior to Visit  Medication Sig Dispense Refill  . acetaminophen (TYLENOL) 500 MG tablet Take 2 tablets (1,000 mg total) by mouth every 6 (six) hours as needed. (Patient taking differently: Take 1,000 mg by mouth every 6 (six) hours as needed for moderate pain.) 100 tablet 2  . amiodarone (PACERONE) 200 MG tablet Take 1 tablet (200 mg total) by mouth 2 (two) times daily. 60 tablet 6  . aspirin EC 81 MG tablet Take 1 tablet (81 mg total) by mouth daily. 90 tablet 3  .  cholecalciferol (VITAMIN D3) 25 MCG (1000 UNIT) tablet Take 2,000 Units by mouth every Saturday.    . clopidogrel (PLAVIX) 75 MG tablet Take 1 tablet (75 mg total) by mouth daily. (Patient taking differently: Take 75 mg by mouth at bedtime.) 30 tablet 10  . Cyanocobalamin (VITAMIN B 12 PO) Take 1 tablet by mouth at bedtime.    . docusate sodium (COLACE) 100 MG capsule Take 1 capsule (100 mg total) by mouth daily as needed. (Patient taking differently: Take 100 mg by mouth daily as needed (constipation).) 30 capsule 2  . furosemide (LASIX) 40 MG tablet Take 1 tablet by mouth once daily 90 tablet 0  . gabapentin (NEURONTIN) 300 MG capsule Take 1 capsule (300 mg total) by mouth at bedtime. (Patient not taking: No sig reported) 90 capsule 3  . glipiZIDE (GLUCOTROL XL) 10 MG 24 hr tablet Take 10 mg by mouth 2 (two) times daily.    [name] icosapent Ethyl (VASCEPA) 1 g capsule Take 2 capsules (2 g total) by mouth 2 (two) times daily. (Patient taking differently: Take 1 g by mouth every other day.) 120 capsule 4  . Insulin Glargine (BASAGLAR KWIKPEN) 100 UNIT/ML Inject 0.2 mLs (20 Units total) into the skin at bedtime. (Patient taking differently: Inject 15 Units into the skin every morning.) 15 mL 0  . LORazepam (ATIVAN) 1 MG tablet Take 1 mg by mouth daily as needed for anxiety.    . metFORMIN (GLUCOPHAGE-XR) 500 MG 24 hr tablet Take 1,000 mg by mouth 2 (two) times daily.    . metoprolol succinate (TOPROL-XL)  50 MG 24 hr tablet Take 50 mg by mouth 3 (three) times daily.    . nitroGLYCERIN (NITROSTAT) 0.4 MG SL tablet Place 1 tablet (0.4 mg total) under the tongue every 5 (five) minutes x 3 doses as needed for chest pain. 30 tablet 11  . nystatin cream (MYCOSTATIN) Apply 1 application topically 2 (two) times daily as needed for dry skin (rash).    [name] omeprazole (PRILOSEC) 20 MG capsule Take 20 mg by mouth daily.    . pantoprazole (PROTONIX) 40 MG tablet Take 1 tablet (40 mg total) by mouth daily. (Patient not  taking: No sig reported) 90 tablet 0  . pravastatin (PRAVACHOL) 20 MG tablet TAKE 1 TABLET BY MOUTH ONCE DAILY IN THE EVENING 90 tablet 1  . valsartan (DIOVAN) 80 MG tablet Take 1 tablet (80 mg total) by mouth 2 (two) times daily. 180 tablet 3  . vitamin C (ASCORBIC ACID) 250 MG tablet Take 250 mg by mouth at bedtime.     No current facility-administered medications on file prior to visit.   Allergies  Allergen Reactions  . Chlorhexidine Gluconate Itching    Received CHG bath, began itching, required benadryl  . Tramadol Other (See Comments)    Hallucinations  . Codeine Rash    Recent Results (from the past 2160 hour(s))  Basic metabolic panel     Status: Abnormal   Collection Time: 12/17/20  8:34 PM  Result Value Ref Range   Sodium 132 (L) 135 - 145 mmol/L   Potassium 4.4 3.5 - 5.1 mmol/L   Chloride 98 98 - 111 mmol/L   CO2 19 (L) 22 - 32 mmol/L   Glucose, Bld 411 (H) 70 - 99 mg/dL    Comment: Glucose reference range applies only to samples taken after fasting for at least 8 hours.   BUN 19 6 - 20 mg/dL   Creatinine, Ser 1.08 (H) 0.44 - 1.00 mg/dL   Calcium 9.4 8.9 - 10.3 mg/dL   GFR, Estimated 59 (L) >60 mL/min    Comment: (NOTE) Calculated using the CKD-EPI Creatinine Equation (2021)    Anion gap 15 5 - 15    Comment: Performed at [facility] [address], [place], [place] [address]  CBC     Status: Abnormal   Collection Time: 12/17/20  8:34 PM  Result Value Ref Range   WBC 13.3 (H) 4.0 - 10.5 K/uL   RBC 4.33 3.87 - 5.11 MIL/uL   Hemoglobin 12.2 12.0 - 15.0 g/dL   HCT 36.7 36.0 - 46.0 %   MCV 84.8 80.0 - 100.0 fL   MCH 28.2 26.0 - 34.0 pg   MCHC 33.2 30.0 - 36.0 g/dL   RDW 13.0 11.5 - 15.5 %   Platelets 347 150 - 400 K/uL   nRBC 0.0 0.0 - 0.2 %    Comment: Performed at [facility], [place] [address], [place], [place] [address]  Troponin I (High Sensitivity)     Status: Abnormal   Collection Time: 12/17/20  8:34 PM  Result Value Ref Range   Troponin  I (High Sensitivity) 155 (HH) <18 ng/L    Comment: CRITICAL RESULT CALLED TO, READ BACK BY AND VERIFIED WITH: RUGGIERO M,RN 12/17/20 2146 [id number] Performed at [facility], [place] [address], [place], [place] [address]   Magnesium     Status: Abnormal   Collection Time: 12/17/20  8:34 PM  Result Value Ref Range   Magnesium 1.3 (L) 1.7 - 2.4  mg/dL    Comment: Performed at [facility], [place] [address], [place], [place] [address]  Resp Panel by RT-PCR (Flu A&B, Covid) Nasopharyngeal Swab     Status: None   Collection Time: 12/17/20  8:54 PM   Specimen: Nasopharyngeal Swab; Nasopharyngeal(NP) swabs in vial transport medium  Result Value Ref Range   SARS Coronavirus 2 by RT PCR NEGATIVE NEGATIVE    Comment: (NOTE) SARS-CoV-2 target nucleic acids are NOT DETECTED.  The SARS-CoV-2 RNA is generally detectable in upper respiratory specimens during the acute phase of infection. The lowest concentration of SARS-CoV-2 viral copies this assay can detect is 138 copies/mL. A negative result does not preclude SARS-Cov-2 infection and should not be used as the sole basis for treatment or other patient management decisions. A negative result may occur with  improper specimen collection/handling, submission of specimen other than nasopharyngeal swab, presence of viral mutation(s) within the areas targeted by this assay, and inadequate number of viral copies(<138 copies/mL). A negative result must be combined with clinical observations, patient history, and epidemiological information. The expected result is Negative.  Fact Sheet for Patients:  EntrepreneurPulse.com.au  Fact Sheet for Healthcare Providers:  IncredibleEmployment.be  This test is no t yet approved or cleared by the [place] FDA and  has been authorized for detection and/or diagnosis of SARS-CoV-2 by FDA under an Emergency Use Authorization (EUA). This EUA will remain  in effect (meaning  this test can be used) for the duration of the COVID-19 declaration under Section 564(b)(1) of the Act, 21 U.S.C.section 360bbb-3(b)(1), unless the authorization is terminated  or revoked sooner.       Influenza A by PCR NEGATIVE NEGATIVE   Influenza B by PCR NEGATIVE NEGATIVE    Comment: (NOTE) The Xpert Xpress SARS-CoV-2/FLU/RSV plus assay is intended as an aid in the diagnosis of influenza from Nasopharyngeal swab specimens and should not be used as a sole basis for treatment. Nasal washings and aspirates are unacceptable for Xpert Xpress SARS-CoV-2/FLU/RSV testing.  Fact Sheet for Patients: EntrepreneurPulse.com.au  Fact Sheet for Healthcare Providers: IncredibleEmployment.be  This test is not yet approved or cleared by the [place] FDA and has been authorized for detection and/or diagnosis of SARS-CoV-2 by FDA under an Emergency Use Authorization (EUA). This EUA will remain in effect (meaning this test can be used) for the duration of the COVID-19 declaration under Section 564(b)(1) of the Act, 21 U.S.C. section 360bbb-3(b)(1), unless the authorization is terminated or revoked.  Performed at [facility], [place] [address], [place], [place] [address]   MRSA PCR Screening     Status: None   Collection Time: 12/17/20 11:30 PM   Specimen: Nasopharyngeal  Result Value Ref Range   MRSA by PCR NEGATIVE NEGATIVE    Comment:        The GeneXpert MRSA Assay (FDA approved for NASAL specimens only), is one component of a comprehensive MRSA colonization surveillance program. It is not intended to diagnose MRSA infection nor to guide or monitor treatment for MRSA infections. Performed at [facility], [place] [address], [place], [place] [address]   Glucose, capillary     Status: Abnormal   Collection Time: 12/17/20 11:40 PM  Result Value Ref Range   Glucose-Capillary 354 (H) 70 - 99 mg/dL    Comment: Glucose reference range  applies only to samples taken after fasting for at least 8 hours.  Troponin I (High Sensitivity)     Status: Abnormal   Collection Time:  12/18/20  1:10 AM  Result Value Ref Range   Troponin I (High Sensitivity) 2,286 (HH) <18 ng/L    Comment: CRITICAL VALUE NOTED.  VALUE IS CONSISTENT WITH PREVIOUSLY REPORTED AND CALLED VALUE. (NOTE) Elevated high sensitivity troponin I (hsTnI) values and significant  changes across serial measurements may suggest ACS but many other  chronic and acute conditions are known to elevate hsTnI results.  Refer to the Links section for chest pain algorithms and additional  guidance. Performed at [facility], [place] [address], [place], [place] [address]   Basic metabolic panel     Status: Abnormal   Collection Time: 12/18/20  1:10 AM  Result Value Ref Range   Sodium 134 (L) 135 - 145 mmol/L   Potassium 4.3 3.5 - 5.1 mmol/L   Chloride 99 98 - 111 mmol/L   CO2 23 22 - 32 mmol/L   Glucose, Bld 369 (H) 70 - 99 mg/dL    Comment: Glucose reference range applies only to samples taken after fasting for at least 8 hours.   BUN 15 6 - 20 mg/dL   Creatinine, Ser 1.07 (H) 0.44 - 1.00 mg/dL   Calcium 9.0 8.9 - 10.3 mg/dL   GFR, Estimated 59 (L) >60 mL/min    Comment: (NOTE) Calculated using the CKD-EPI Creatinine Equation (2021)    Anion gap 12 5 - 15    Comment: Performed at [facility] [address] [place], [place] [address]  Brain natriuretic peptide     Status: Abnormal   Collection Time: 12/18/20  1:10 AM  Result Value Ref Range   B Natriuretic Peptide 113.2 (H) 0.0 - 100.0 pg/mL    Comment: Performed at [facility] [address], [place], [place] [address]  Hemoglobin A1c     Status: Abnormal   Collection Time: 12/18/20  1:10 AM  Result Value Ref Range   Hgb A1c MFr Bld 11.2 (H) 4.8 - 5.6 %    Comment: (NOTE) Pre diabetes:          5.7%-6.4%  Diabetes:              >6.4%  Glycemic control for   <7.0% adults with diabetes     Mean Plasma Glucose 274.74 mg/dL    Comment: Performed at [facility] [address], [place], [place] [address]  Magnesium     Status: Abnormal   Collection Time: 12/18/20  3:55 AM  Result Value Ref Range   Magnesium 1.3 (L) 1.7 - 2.4 mg/dL    Comment: Performed at [facility] [address] [place], [place] [address]  CBC     Status: Abnormal   Collection Time: 12/18/20  3:55 AM  Result Value Ref Range   WBC 8.8 4.0 - 10.5 K/uL   RBC 3.82 (L) 3.87 - 5.11 MIL/uL   Hemoglobin 11.2 (L) 12.0 - 15.0 g/dL   HCT 31.9 (L) 36.0 - 46.0 %   MCV 83.5 80.0 - 100.0 fL   MCH 29.3 26.0 - 34.0 pg   MCHC 35.1 30.0 - 36.0 g/dL   RDW 13.1 11.5 - 15.5 %   Platelets 254 150 - 400 K/uL   nRBC 0.0 0.0 - 0.2 %    Comment: Performed at [facility], [place] [address], [place], [place] [address]  Glucose, capillary     Status: Abnormal   Collection Time: 12/18/20  6:40 AM  Result Value Ref Range   Glucose-Capillary  265 (H) 70 - 99 mg/dL    Comment: Glucose reference range applies only to samples taken after fasting for at least 8 hours.  Glucose, capillary     Status: Abnormal   Collection Time: 12/18/20 11:12 AM  Result Value Ref Range   Glucose-Capillary 258 (H) 70 - 99 mg/dL    Comment: Glucose reference range applies only to samples taken after fasting for at least 8 hours.  Surgical PCR screen     Status: None   Collection Time: 12/18/20  2:42 PM   Specimen: Nasal Mucosa; Nasal Swab  Result Value Ref Range   MRSA, PCR NEGATIVE NEGATIVE   Staphylococcus aureus NEGATIVE NEGATIVE    Comment: (NOTE) The Xpert SA Assay (FDA approved for NASAL specimens in patients 30 years of age and older), is one component of a comprehensive surveillance program. It is not intended to diagnose infection nor to guide or monitor treatment. Performed at [facility], [place] [address], [place], [place] [address]   Glucose, capillary     Status: Abnormal   Collection Time: 12/18/20  3:24 PM   Result Value Ref Range   Glucose-Capillary 275 (H) 70 - 99 mg/dL    Comment: Glucose reference range applies only to samples taken after fasting for at least 8 hours.  ECHOCARDIOGRAM COMPLETE     Status: None   Collection Time: 12/18/20  3:31 PM  Result Value Ref Range   Weight 3,742.53 oz   BP 142/69 mmHg   Single Plane A2C EF 51.1 %   Single Plane A4C EF 44.2 %   Calc EF 47.5 %   S' Lateral 4.10 cm   Area-P 1/2 2.73 cm2  Glucose, capillary     Status: Abnormal   Collection Time: 12/18/20 10:10 PM  Result Value Ref Range   Glucose-Capillary 276 (H) 70 - 99 mg/dL    Comment: Glucose reference range applies only to samples taken after fasting for at least 8 hours.  Basic metabolic panel     Status: Abnormal   Collection Time: 12/19/20 12:45 AM  Result Value Ref Range   Sodium 134 (L) 135 - 145 mmol/L   Potassium 4.1 3.5 - 5.1 mmol/L   Chloride 102 98 - 111 mmol/L   CO2 21 (L) 22 - 32 mmol/L   Glucose, Bld 258 (H) 70 - 99 mg/dL    Comment: Glucose reference range applies only to samples taken after fasting for at least 8 hours.   BUN 11 6 - 20 mg/dL   Creatinine, Ser 0.87 0.44 - 1.00 mg/dL   Calcium 8.7 (L) 8.9 - 10.3 mg/dL   GFR, Estimated >60 >60 mL/min    Comment: (NOTE) Calculated using the CKD-EPI Creatinine Equation (2021)    Anion gap 11 5 - 15    Comment: Performed at [facility] [address], [place], [place] [address]  Magnesium     Status: None   Collection Time: 12/19/20 12:45 AM  Result Value Ref Range   Magnesium 1.9 1.7 - 2.4 mg/dL    Comment: Performed at [facility] [address], [place] [address]  CBC     Status: Abnormal   Collection Time: 12/19/20 12:45 AM  Result Value Ref Range   WBC 7.5 4.0 - 10.5 K/uL   RBC 3.83 (L) 3.87 - 5.11 MIL/uL   Hemoglobin 11.2 (L) 12.0 - 15.0 g/dL   HCT 32.2 (L) 36.0 - 46.0 %   MCV 84.1 80.0 -  100.0 fL   MCH 29.2 26.0 - 34.0 pg   MCHC 34.8 30.0 - 36.0 g/dL   RDW 13.1 11.5 - 15.5 %    Platelets 253 150 - 400 K/uL   nRBC 0.0 0.0 - 0.2 %    Comment: Performed at [facility], [place] [address] [place], [place] [address]  Glucose, capillary     Status: Abnormal   Collection Time: 12/19/20  6:25 AM  Result Value Ref Range   Glucose-Capillary 299 (H) 70 - 99 mg/dL    Comment: Glucose reference range applies only to samples taken after fasting for at least 8 hours.  Glucose, capillary     Status: Abnormal   Collection Time: 12/19/20 11:09 AM  Result Value Ref Range   Glucose-Capillary 262 (H) 70 - 99 mg/dL    Comment: Glucose reference range applies only to samples taken after fasting for at least 8 hours.  Glucose, capillary     Status: Abnormal   Collection Time: 12/19/20  3:48 PM  Result Value Ref Range   Glucose-Capillary 237 (H) 70 - 99 mg/dL    Comment: Glucose reference range applies only to samples taken after fasting for at least 8 hours.  Glucose, capillary     Status: Abnormal   Collection Time: 12/19/20  9:30 PM  Result Value Ref Range   Glucose-Capillary 244 (H) 70 - 99 mg/dL    Comment: Glucose reference range applies only to samples taken after fasting for at least 8 hours.  Basic metabolic panel     Status: Abnormal   Collection Time: 12/20/20 12:56 AM  Result Value Ref Range   Sodium 134 (L) 135 - 145 mmol/L   Potassium 4.8 3.5 - 5.1 mmol/L   Chloride 103 98 - 111 mmol/L   CO2 21 (L) 22 - 32 mmol/L   Glucose, Bld 315 (H) 70 - 99 mg/dL    Comment: Glucose reference range applies only to samples taken after fasting for at least 8 hours.   BUN 11 6 - 20 mg/dL   Creatinine, Ser 0.79 0.44 - 1.00 mg/dL   Calcium 8.5 (L) 8.9 - 10.3 mg/dL   GFR, Estimated >60 >60 mL/min    Comment: (NOTE) Calculated using the CKD-EPI Creatinine Equation (2021)    Anion gap 10 5 - 15    Comment: Performed at [facility] [address] [place], [place] [address]  Magnesium     Status: None   Collection Time: 12/20/20 12:56 AM  Result Value Ref Range    Magnesium 2.0 1.7 - 2.4 mg/dL    Comment: Performed at [facility] [address], [place], [place] [address]  CBC     Status: Abnormal   Collection Time: 12/20/20 12:56 AM  Result Value Ref Range   WBC 8.7 4.0 - 10.5 K/uL   RBC 4.00 3.87 - 5.11 MIL/uL   Hemoglobin 11.4 (L) 12.0 - 15.0 g/dL   HCT 34.8 (L) 36.0 - 46.0 %   MCV 87.0 80.0 - 100.0 fL   MCH 28.5 26.0 - 34.0 pg   MCHC 32.8 30.0 - 36.0 g/dL   RDW 13.2 11.5 - 15.5 %   Platelets 258 150 - 400 K/uL   nRBC 0.0 0.0 - 0.2 %    Comment: Performed at [facility], [place] [address], [place], [place] [address]  Glucose, capillary     Status: Abnormal   Collection Time: 12/20/20  6:38 AM  Result Value  Ref Range   Glucose-Capillary 317 (H) 70 - 99 mg/dL    Comment: Glucose reference range applies only to samples taken after fasting for at least 8 hours.  CUP PACEART INCLINIC DEVICE CHECK     Status: None   Collection Time: 01/10/21  5:12 PM  Result Value Ref Range   Date Time Interrogation Session [phone]    Pulse Generator Manufacturer OTHER    Pulse Gen Model [id number] Denison DR    Pulse Gen Serial Number [id number]    Clinic Name [facility]    Implantable Pulse Generator Type Implantable Cardiac Defibulator    Implantable Pulse Generator Implant Date [id number]    Implantable Lead Manufacturer [facility]    Implantable Lead Model [id number] Durata SJ4    Implantable Lead Serial Number [id number]    Implantable Lead Implant Date 36468032    Implantable Lead Location Detail 1 UNKNOWN    Implantable Lead Location [id number]    Implantable Lead Manufacturer [facility]    Implantable Lead Model [id number] Tendril MRI    Implantable Lead Serial Number [id number]    Implantable Lead Implant Date [id number]    Implantable Lead Location Detail 1 UNKNOWN    Implantable Lead Location [id number]    Lead Channel Setting Sensing Sensitivity 0.5 mV   Lead Channel Setting Pacing Amplitude 3.5 V   Lead Channel Setting Pacing Pulse Width 0.5 ms    Lead Channel Setting Pacing Amplitude 3.5 V   Lead Channel Impedance Value 400.0 ohm   Lead Channel Sensing Intrinsic Amplitude 5.0 mV   Lead Channel Pacing Threshold Amplitude 0.5 V   Lead Channel Pacing Threshold Pulse Width 0.5 ms   Lead Channel Pacing Threshold Amplitude 0.5 V   Lead Channel Pacing Threshold Pulse Width 0.5 ms   Lead Channel Impedance Value 462.5 ohm   Lead Channel Sensing Intrinsic Amplitude 12.0 mV   Lead Channel Pacing Threshold Amplitude 0.5 V   Lead Channel Pacing Threshold Pulse Width 0.5 ms   Lead Channel Pacing Threshold Amplitude 0.5 V   Lead Channel Pacing Threshold Pulse Width 0.5 ms   HighPow Impedance 57.375    Battery Remaining Longevity 106 mo   Brady Statistic RA Percent Paced 0 %   Brady Statistic RV Percent Paced 0 %   Eval Rhythm [id number]   Comprehensive metabolic panel     Status: Abnormal   Collection Time: 01/12/21 10:13 AM  Result Value Ref Range   Glucose 219 (H) 65 - 99 mg/dL   BUN 21 8 - 27 mg/dL   Creatinine, Ser 1.04 (H) 0.57 - 1.00 mg/dL   GFR calc non Af Amer 59 (L) >59 mL/min/1.73   GFR calc Af Amer 67 >59 mL/min/1.73    Comment: **In accordance with recommendations from the NKF-ASN Task force,**   [facility] is in the process of updating its eGFR calculation to the   2021 CKD-EPI creatinine equation that estimates kidney function   without a race variable.    BUN/Creatinine Ratio 20 12 - 28   Sodium 138 134 - 144 mmol/L   Potassium 5.3 (H) 3.5 - 5.2 mmol/L   Chloride 98 96 - 106 mmol/L   CO2 25 20 - 29 mmol/L   Calcium 10.1 8.7 - 10.3 mg/dL   Total Protein 7.7 6.0 - 8.5 g/dL   Albumin 4.3 3.8 - 4.9 g/dL   Globulin, Total 3.4 1.5 - 4.5 g/dL   Albumin/Globulin Ratio 1.3 1.2 - 2.2   Bilirubin Total 0.4 0.0 - 1.2 mg/dL  Alkaline Phosphatase 62 44 - 121 IU/L   AST 19 0 - 40 IU/L   ALT 17 0 - 32 IU/L  Pro b natriuretic peptide (BNP)     Status: None   Collection Time: 01/12/21 10:13 AM  Result Value Ref Range   NT-Pro BNP 246  0 - 287 pg/mL    Comment: The following cut-points have been suggested for the use of proBNP for the diagnostic evaluation of heart failure (HF) in patients with acute dyspnea: Modality                     Age           Optimal Cut                            (years)            Point ------------------------------------------------------ Diagnosis (rule in HF)        <50            450 pg/mL                           50 - 75            900 pg/mL                               >75           1800 pg/mL Exclusion (rule out HF)  Age independent     300 pg/mL   TSH+T4F+T3Free     Status: None   Collection Time: 01/12/21 10:13 AM  Result Value Ref Range   TSH 2.790 0.450 - 4.500 uIU/mL   T3, Free 2.7 2.0 - 4.4 pg/mL   Free T4 1.63 0.82 - 1.77 ng/dL    Objective: General: Patient is awake, alert, and oriented x 3 and in no acute distress.  Integument: Skin is warm, dry and supple bilateral. Nails are short and thick dystrophic with subungual debris, consistent with onychomycosis, 1-5 on left 2-5 on right, No signs of infection. Dry skin to heels with fissuring. No open lesions or preulcerative lesions present bilateral. Remaining integument unremarkable.  Vasculature:  Dorsalis Pedis pulse 1/4 bilateral. Posterior Tibial pulse  1/4 bilateral. Capillary fill time <3 secbilateral. Positive hair growth to the level of the digits.Temperature gradient within normal limits. No varicosities present bilateral. No edema present bilateral.   Neurology: The patient has diminished sensation measured with a 5.07/10g Semmes Weinstein Monofilament at all pedal sites bilateral.  Musculoskeletal: Status post right hallux amputation.  Assessment and Plan: Problem List Items Addressed This Visit      Endocrine   Uncontrolled type 2 diabetes mellitus ([place])    Other Visit Diagnoses    Fissure in skin of both feet    -  Primary   History of amputation of hallux ([place])          -Examined  patient. -Discussed and educated patient on diabetic foot care -Advised patient to use Vaseline after a shower and foot miracle cream in the morning for dry fissuring/cracking skin -Answered all patient questions -Patient to return  in 3 months for diabetic foot check -Patient advised to call the office if any problems or questions arise in the meantime.  Landis Martins, DPM

## 2021-02-08 ENCOUNTER — Ambulatory Visit: Payer: Commercial Managed Care - PPO | Admitting: Cardiology

## 2021-02-13 NOTE — Progress Notes (Signed)
Cardiology Office Note:    Date:  02/14/2021   ID:  Jasmine Richardson, DOB 20-Jul-1961, MRN [id number]  PCP:  Ernestene Kiel, MD  Cardiologist:  Shirlee More, MD    Referring MD: Ernestene Kiel, MD    ASSESSMENT:    1. Coronary artery disease involving native coronary artery of native heart with angina pectoris ([place])   2. Ventricular tachycardia ([place])   3. On amiodarone therapy   4. ICD (implantable cardioverter-defibrillator) in place   5. Mixed hyperlipidemia    PLAN:    In order of problems listed above:  1. Stable CAD having no anginal discomfort following surgery as well as complex PCI continue therapy including her aspirin clopidogrel long-term dual antiplatelet therapy beta-blocker and lipid-lowering. 2. Stable reduce amiodarone 200 mg daily recheck liver function thyroid test 3. Stable ICD followed by device clinic 4. Elevated triglycerides in the setting of diabetes intolerant of fish oil products we will place her on TriCor along with her statin.   Next appointment: 3 months   Medication Adjustments/Labs and Tests Ordered: Current medicines are reviewed at length with the patient today.  Concerns regarding medicines are outlined above.  No orders of the defined types were placed in this encounter.  No orders of the defined types were placed in this encounter.   Chief Complaint  Patient presents with  . Follow-up    Most recently had sustained VT is on amiodarone has an ICD    History of Present Illness:     Jasmine Richardson is a 60 y.o. female with a hx of CAD CABG 2017 with postoperative mediastinitis reoperation at [facility] with myocutaneous pectoral flaps for closure diastolic heart failure type 2 diabetes hypertension and hyperlipidemia last seen 08/08/2020.  She presented with ACS and PCI and stent native right coronary artery 05/25/2020 with occlusion of the saphenous vein graft.  Following this she had a complex PCI and  stent to left main coronary artery with shockwave intravascular lithotripsy 06/22/2020 with dissection and occlusion of a small caliber first diagonal branch.  She was last seen 01/11/2021 following a recent hospitalization.  She was admitted to the hospital 12/17/2020 with wide complex tachycardia, and ventricular tachycardia treated with IV amiodarone and lidocaine and subsequently cardioverted back to sinus rhythm. She was maintained and loaded with amiodarone. Repeat echocardiogram showed ejection fraction 45 to 50% left heart catheterization was performed and there is no change in previous severe CAD and recent stents were patent and there was no culprit or target lesion for intervention.  Subsequently with ventricular tachycardia and CAD she had an ICD placed.  Her thyroid studies were normal initiating amiodarone 12/17/2020 . Component Ref Range & Units 1 mo ago 8 mo ago  TSH 0.450 - 4.500 uIU/mL 2.790  0.961 R, CM   T3, Free 2.0 - 4.4 pg/mL 2.7    Free T4 0.82 - 1.77 ng/dL 1.63      Compliance with diet, lifestyle and medications: Yes  She is without a primary care physician should be contacting Dr. Rica Records Tolerates amiodarone without side effects will reduce the dose and recheck liver function and thyroids. She has had no ICD therapy palpitations syncope Heart failure is compensated no edema orthopnea shortness of breath Stable CAD no anginal discomfort. She is intolerant of fish oil and icosapent ethyl with regurgitation and fish breath Past Medical History:  Diagnosis Date  . Acute chest pain 05/08/2016  . Angina pectoris ([place]) 09/11/2017  . [place] 05/08/2016  .  Chronic diastolic heart failure ([place]) 11/06/2016  . Coronary artery disease involving native coronary artery of native heart with angina pectoris ([place]) 09/12/2015   Overview:  PCI and stent of RCA 2009, last cath 2012 with medical therapy  CABG May 2017  . Emphysema lung ([place]) 09/11/2017  . Essential hypertension 09/12/2015  .  GERD (gastroesophageal reflux disease) 05/08/2016  . Hyperlipidemia 09/12/2015  . Mediastinitis 06/28/2016  . Morbid obesity ([place]) 05/08/2016  . S/P CABG (coronary artery bypass graft) 06/03/2016   Overview:  The patient underwent sternal reconstruction on 06/21/16 with pec flaps for mediastinitis from a prior CABG in May 2017. On admission, she was critically ill from sepsis and had altered mental status. She was last seen in clinic on 08/09/16 at which time she was doing well.  . Severe sepsis ([place]) 06/03/2016  . Sinus tachycardia 05/08/2016  . Tobacco use disorder 04/20/2016   Overview:  Quit in May 2017.  [name] Uncontrolled type 2 diabetes mellitus ([place]) 05/08/2016  . Wound, surgical, infected 06/06/2016   Overview:  sternal    Past Surgical History:  Procedure Laterality Date  . AMPUTATION TOE Right 02/20/2020   Procedure: AMPUTATION RIGHT GREAT  TOE;  Surgeon: Felipa Furnace, DPM;  Location: [place];  Service: Podiatry;  Laterality: Right;  . BLADDER SURGERY    . CARDIAC CATHETERIZATION    . CORONARY ARTERY BYPASS GRAFT    . CORONARY STENT INTERVENTION N/A 05/25/2020   Procedure: CORONARY STENT INTERVENTION;  Surgeon: Wellington Hampshire, MD;  Location: [place] [facility];  Service: Cardiovascular;  Laterality: N/A;  . CORONARY STENT INTERVENTION N/A 06/22/2020   Procedure: CORONARY STENT INTERVENTION;  Surgeon: Leonie Man, MD;  Location: [place] [facility];  Service: Cardiovascular;  Laterality: N/A;  . ICD IMPLANT N/A 12/19/2020   Procedure: ICD IMPLANT;  Surgeon: Evans Lance, MD;  Location: [place] [facility];  Service: Cardiovascular;  Laterality: N/A;  . INCISION AND DRAINAGE Right 02/19/2020   Procedure: INCISION AND DRAINAGE;  Surgeon: Trula Slade, DPM;  Location: [place];  Service: Podiatry;  Laterality: Right;  Block done by surgeon  . INTRAVASCULAR ULTRASOUND/IVUS N/A 06/22/2020   Procedure: Intravascular Ultrasound/IVUS;  Surgeon: Leonie Man, MD;  Location: [place] [facility];   Service: Cardiovascular;  Laterality: N/A;  . LEFT HEART CATH AND CORS/GRAFTS ANGIOGRAPHY N/A 05/23/2020   Procedure: LEFT HEART CATH AND CORS/GRAFTS ANGIOGRAPHY;  Surgeon: Nelva Bush, MD;  Location: [place] [facility];  Service: Cardiovascular;  Laterality: N/A;  . LEFT HEART CATH AND CORS/GRAFTS ANGIOGRAPHY N/A 12/19/2020   Procedure: LEFT HEART CATH AND CORS/GRAFTS ANGIOGRAPHY;  Surgeon: Burnell Blanks, MD;  Location: [place] [facility];  Service: Cardiovascular;  Laterality: N/A;  . METATARSAL HEAD EXCISION Right 05/02/2020   Procedure: FIRST METATARSAL HEAD RESECTION; RIGHT FOOT WOUND CLOSURE;  Surgeon: Landis Martins, DPM;  Location: [place];  Service: Podiatry;  Laterality: Right;  MAC W/LOCAL  . TUBAL LIGATION      Current Medications: Current Meds  Medication Sig  . acetaminophen (TYLENOL) 500 MG tablet Take 2 tablets (1,000 mg total) by mouth every 6 (six) hours as needed. (Patient taking differently: Take 1,000 mg by mouth every 6 (six) hours as needed for moderate pain.)  . amiodarone (PACERONE) 200 MG tablet Take 1 tablet (200 mg total) by mouth 2 (two) times daily.  [name] aspirin EC 81 MG tablet Take 1 tablet (81 mg total) by mouth daily.  . cholecalciferol (VITAMIN  D3) 25 MCG (1000 UNIT) tablet Take 2,000 Units by mouth every Saturday.  . clopidogrel (PLAVIX) 75 MG tablet Take 1 tablet (75 mg total) by mouth daily. (Patient taking differently: Take 75 mg by mouth at bedtime.)  . Cyanocobalamin (VITAMIN B 12 PO) Take 1 tablet by mouth at bedtime.  . docusate sodium (COLACE) 100 MG capsule Take 1 capsule (100 mg total) by mouth daily as needed. (Patient taking differently: Take 100 mg by mouth daily as needed (constipation).)  . furosemide (LASIX) 40 MG tablet Take 1 tablet by mouth once daily  . glipiZIDE (GLUCOTROL XL) 10 MG 24 hr tablet Take 10 mg by mouth 2 (two) times daily.  . Insulin Glargine (BASAGLAR KWIKPEN) 100 UNIT/ML Inject 0.2 mLs (20 Units  total) into the skin at bedtime. (Patient taking differently: Inject 15 Units into the skin every morning.)  . LORazepam (ATIVAN) 1 MG tablet Take 1 mg by mouth daily as needed for anxiety.  . metFORMIN (GLUCOPHAGE-XR) 500 MG 24 hr tablet Take 1,000 mg by mouth 2 (two) times daily.  . metoprolol succinate (TOPROL-XL) 50 MG 24 hr tablet Take 50 mg by mouth 3 (three) times daily.  . nitroGLYCERIN (NITROSTAT) 0.4 MG SL tablet DISSOLVE ONE TABLET UNDER THE TONGUE EVERY 5 MINUTES AS NEEDED FOR CHEST PAIN.  DO NOT EXCEED A TOTAL OF 3 DOSES IN 15 MINUTES  . nystatin cream (MYCOSTATIN) Apply 1 application topically 2 (two) times daily as needed for dry skin (rash).  . pravastatin (PRAVACHOL) 20 MG tablet TAKE 1 TABLET BY MOUTH ONCE DAILY IN THE EVENING  . valsartan (DIOVAN) 80 MG tablet Take 1 tablet (80 mg total) by mouth 2 (two) times daily.  . vitamin C (ASCORBIC ACID) 250 MG tablet Take 250 mg by mouth at bedtime.     Allergies:   Chlorhexidine gluconate, Tramadol, and Codeine   Social History   Socioeconomic History  . Marital status: Married    Spouse name: Not on file  . Number of children: Not on file  . Years of education: Not on file  . Highest education level: Not on file  Occupational History  . Not on file  Tobacco Use  . Smoking status: Former Smoker    Quit date: 05/2016    Years since quitting: 4.7  . Smokeless tobacco: Never Used  Vaping Use  . Vaping Use: Never used  Substance and Sexual Activity  . Alcohol use: No  . Drug use: No  . Sexual activity: Yes  Other Topics Concern  . Not on file  Social History Narrative  . Not on file   Social Determinants of Health   Financial Resource Strain: Not on file  Food Insecurity: Not on file  Transportation Needs: Not on file  Physical Activity: Not on file  Stress: Not on file  Social Connections: Not on file     Family History: The patient's family history includes Alzheimer's disease in her father; Heart attack in  her father; Heart disease in her father; Hyperlipidemia in her brother and mother; Hypertension in her brother, father, and mother. ROS:   Please see the history of present illness.    All other systems reviewed and are negative.  EKGs/Labs/Other Studies Reviewed:    The following studies were reviewed today:  EKG:  EKG ordered today and personally reviewed.  The ekg ordered today demonstrates sinus rhythm right bundle branch block old anterior septal MI left posterior fascicular block normal QT interval  Recent Labs: 12/18/2020: B  Natriuretic Peptide 113.2 12/20/2020: Hemoglobin 11.4; Magnesium 2.0; Platelets 258 01/12/2021: ALT 17; BUN 21; Creatinine, Ser 1.04; NT-Pro BNP 246; Potassium 5.3; Sodium 138; TSH 2.790  Recent Lipid Panel    Component Value Date/Time   CHOL 208 (H) 05/22/2020 2122   TRIG 559 (H) 05/22/2020 2122   HDL 37 (L) 05/22/2020 2122   CHOLHDL 5.6 05/22/2020 2122   VLDL UNABLE TO CALCULATE IF TRIGLYCERIDE OVER 400 mg/dL 05/22/2020 2122   LDLCALC UNABLE TO CALCULATE IF TRIGLYCERIDE OVER 400 mg/dL 05/22/2020 2122   LDLDIRECT 91.4 05/22/2020 2122    Physical Exam:    VS:  BP 140/82   Pulse 65   Ht _0  (1.702 m)   Wt 239 lb 12.8 oz (108.8 kg)   LMP  (LMP Unknown)   SpO2 97%   BMI 37.56 kg/m     Wt Readings from Last 3 Encounters:  02/14/21 239 lb 12.8 oz (108.8 kg)  01/11/21 230 lb 12.8 oz (104.7 kg)  12/20/20 229 lb 15 oz (104.3 kg)     GEN: She looks much more healthy than last visit well nourished, well developed in no acute distress HEENT: Normal NECK: No JVD; No carotid bruits LYMPHATICS: No lymphadenopathy CARDIAC: RRR, no murmurs, rubs, gallops RESPIRATORY:  Clear to auscultation without rales, wheezing or rhonchi  ABDOMEN: Soft, non-tender, non-distended MUSCULOSKELETAL:  No edema; No deformity  SKIN: Warm and dry NEUROLOGIC:  Alert and oriented x 3 PSYCHIATRIC:  Normal affect    Signed, Shirlee More, MD  02/14/2021 10:16 AM    [place]

## 2021-02-14 ENCOUNTER — Other Ambulatory Visit: Payer: Self-pay

## 2021-02-14 ENCOUNTER — Encounter: Payer: Self-pay | Admitting: Cardiology

## 2021-02-14 ENCOUNTER — Ambulatory Visit: Payer: Managed Care, Other (non HMO) | Admitting: Cardiology

## 2021-02-14 VITALS — BP 140/82 | HR 65 | Ht 67.0 in | Wt 239.8 lb

## 2021-02-14 DIAGNOSIS — E782 Mixed hyperlipidemia: Secondary | ICD-10-CM

## 2021-02-14 DIAGNOSIS — Z95810 Presence of automatic (implantable) cardiac defibrillator: Secondary | ICD-10-CM | POA: Diagnosis not present

## 2021-02-14 DIAGNOSIS — I25119 Atherosclerotic heart disease of native coronary artery with unspecified angina pectoris: Secondary | ICD-10-CM | POA: Diagnosis not present

## 2021-02-14 DIAGNOSIS — Z79899 Other long term (current) drug therapy: Secondary | ICD-10-CM

## 2021-02-14 DIAGNOSIS — I472 Ventricular tachycardia, unspecified: Secondary | ICD-10-CM

## 2021-02-14 LAB — COMPREHENSIVE METABOLIC PANEL
ALT: 21 IU/L (ref 0–32)
AST: 15 IU/L (ref 0–40)
Albumin/Globulin Ratio: 1.3 (ref 1.2–2.2)
Albumin: 4.4 g/dL (ref 3.8–4.9)
Alkaline Phosphatase: 57 IU/L (ref 44–121)
BUN/Creatinine Ratio: 24 (ref 12–28)
BUN: 24 mg/dL (ref 8–27)
Bilirubin Total: 0.3 mg/dL (ref 0.0–1.2)
CO2: 21 mmol/L (ref 20–29)
Calcium: 10.4 mg/dL — ABNORMAL HIGH (ref 8.7–10.3)
Chloride: 95 mmol/L — ABNORMAL LOW (ref 96–106)
Creatinine, Ser: 1.01 mg/dL — ABNORMAL HIGH (ref 0.57–1.00)
Globulin, Total: 3.3 g/dL (ref 1.5–4.5)
Glucose: 269 mg/dL — ABNORMAL HIGH (ref 65–99)
Potassium: 5.4 mmol/L — ABNORMAL HIGH (ref 3.5–5.2)
Sodium: 132 mmol/L — ABNORMAL LOW (ref 134–144)
Total Protein: 7.7 g/dL (ref 6.0–8.5)
eGFR: 64 mL/min/{1.73_m2} (ref >59)

## 2021-02-14 LAB — LIPID PANEL
Chol/HDL Ratio: 5.9 ratio — ABNORMAL HIGH (ref 0.0–4.4)
Cholesterol, Total: 351 mg/dL — ABNORMAL HIGH (ref 100–199)
HDL: 60 mg/dL (ref >39)
LDL Chol Calc (NIH): 197 mg/dL — ABNORMAL HIGH (ref 0–99)
Triglycerides: 455 mg/dL — ABNORMAL HIGH (ref 0–149)
VLDL Cholesterol Cal: 94 mg/dL — ABNORMAL HIGH (ref 5–40)

## 2021-02-14 LAB — T3: T3, Total: 76 ng/dL (ref 71–180)

## 2021-02-14 LAB — T4: T4, Total: 9.4 ug/dL (ref 4.5–12.0)

## 2021-02-14 MED ORDER — FENOFIBRATE 145 MG PO TABS: 145.0000 mg | ORAL_TABLET | Freq: Every day | ORAL | 6 refills | Status: DC

## 2021-02-14 MED ORDER — METOPROLOL SUCCINATE ER 50 MG PO TB24: 50.0000 mg | ORAL_TABLET | Freq: Three times a day (TID) | ORAL | 12 refills | Status: DC

## 2021-02-14 MED ORDER — BASAGLAR KWIKPEN 100 UNIT/ML ~~LOC~~ SOPN: 15.0000 [IU] | PEN_INJECTOR | Freq: Every morning | SUBCUTANEOUS | 0 refills | Status: DC

## 2021-02-14 MED ORDER — AMIODARONE HCL 200 MG PO TABS: 200.0000 mg | ORAL_TABLET | Freq: Every day | ORAL | 6 refills | Status: DC

## 2021-02-14 NOTE — Patient Instructions (Signed)
Medication Instructions:  Your physician has recommended you make the following change in your medication:   Stop Vascepa Start Tricor 145 mg daily Decrease Amiodarone 200 mg to once daily  *If you need a refill on your cardiac medications before your next appointment, please call your pharmacy*   Lab Work: Your physician recommends that you have CMP, Lipids, TSH, T3 and T4.  If you have labs (blood work) drawn today and your tests are completely normal, you will receive your results only by: [name] MyChart Message (if you have MyChart) OR . A paper copy in the mail If you have any lab test that is abnormal or we need to change your treatment, we will call you to review the results.   Testing/Procedures: None ordered   Follow-Up: At [facility], you and your health needs are our priority.  As part of our continuing mission to provide you with exceptional heart care, we have created designated Provider Care Teams.  These Care Teams include your primary Cardiologist (physician) and Advanced Practice Providers (APPs -  Physician Assistants and Nurse Practitioners) who all work together to provide you with the care you need, when you need it.  We recommend signing up for the patient portal called "MyChart".  Sign up information is provided on this After Visit Summary.  MyChart is used to connect with patients for Virtual Visits (Telemedicine).  Patients are able to view lab/test results, encounter notes, upcoming appointments, etc.  Non-urgent messages can be sent to your provider as well.   To learn more about what you can do with MyChart, go to [email or web address].    Your next appointment:   3 month(s)  The format for your next appointment:   In Person  Provider:   Shirlee More, MD   Other Instructions Fenofibrate (micronized) capsule What is this medicine? FENOFIBRATE (fen oh FYE brate) lowers triglyceride levels in the blood. It also increases good cholesterol levels. It  is used with lifestyle changes, like diet and exercise. It may be used alone or with other medicines. This medicine may be used for other purposes; ask your health care provider or pharmacist if you have questions. COMMON BRAND NAME(S): Reinaldo Meeker, Tricor What should I tell my health care provider before I take this medicine? They need to know if you have any of these conditions:  blood clots  diabetes (high blood sugar)  gallbladder disease  kidney disease  liver disease  low thyroid levels  take medicines that treat or prevent blood clots  an unusual or allergic reaction to fenofibrate, other medicines, foods, dyes, or preservatives  pregnant or trying to get pregnant  breast-feeding How should I use this medicine? Take this medicine by mouth. Take it as directed on the prescription label at the same time every day. Do not cut, crush or chew this medicine. Swallow the capsules whole. Keep taking it unless your health care provider tells you to stop. Some brands of this medicine may be taken with or without food. If it upsets your stomach, take it with food. Other brands of this medicine should be taken with food. Ask your health care provider or pharmacist if you should take this medicine with or without food. Take bile acid sequestrants at a different time of day than this medicine. Take this medicine 1 hour BEFORE or 4 to 6 hours AFTER bile acid sequestrants. Talk to your health care provider regarding the use of this medicine in children. Special care may be needed. Overdosage: If  you think you have taken too much of this medicine contact a poison control center or emergency room at once. NOTE: This medicine is only for you. Do not share this medicine with others. What if I miss a dose? If you miss a dose, take it as soon as you can. If it is almost time for your next dose, take only that dose. Do not take double or extra doses. What may interact with this medicine? This  medicine may interact with the following medications:  bile acid resins like cholestyramine, colesevelam, and colestipol  certain medicines for cholesterol like atorvastatin, lovastatin, and simvastatin  certain medicines for diabetes, like glipizide or glyburide  certain medicines that suppress the body's immune response like cyclosporine and tacrolimus  certain medicines that treat or prevent blood clots like warfarin  colchicine  ezetimibe  supplements like red yeast rice This list may not describe all possible interactions. Give your health care provider a list of all the medicines, herbs, non-prescription drugs, or dietary supplements you use. Also tell them if you smoke, drink alcohol, or use illegal drugs. Some items may interact with your medicine. What should I watch for while using this medicine? Visit your health care provider for regular checks on your progress. Tell your health care provider if your symptoms do not start to get better or if they get worse. This medicine may cause serious skin reactions. They can happen weeks to months after starting the medicine. Contact your health care provider right away if you notice fevers or flu-like symptoms with a rash. The rash may be red or purple and then turn into blisters or peeling of the skin. Or, you might notice a red rash with swelling of the face, lips, or lymph nodes in your neck or under your arms. Taking this medicine is only part of a total heart healthy program. Your health care provider may give you a special diet to follow. Avoid alcohol. Avoid smoking. Ask your health care provider how much you should exercise. Your health care provider may tell you to stop taking this medicine if you develop muscle problems. If your muscle problems do not go away after stopping this medicine, contact your health care provider. This medicine can make you more sensitive to the sun. Keep out of the sun. If you cannot avoid being in the  sun, wear protective clothing and sunscreen. Do not use sun lamps or tanning beds/booths. This medicine may cause a decrease in vitamin B12. You should make sure that you get enough B12 while you are taking this medicine. Discuss the foods you eat and the vitamins you take with your health care provider. What side effects may I notice from receiving this medicine? Side effects that you should report to your doctor or health care professional as soon as possible:  allergic reactions (skin rash, itching or hives; swelling of the face, lips, or tongue)  blood clot (chest pain; shortness of breath; pain, swelling, or warmth in the leg)  infection (fever, chills, cough, sore throat, pain or trouble passing urine)  liver injury (dark yellow or brown urine; general ill feeling or flu-like symptoms; loss of appetite, right upper belly pain; unusually weak or tired, yellowing of the eyes or skin)  low red blood cell counts (trouble breathing, feeling faint; lightheaded, falls; unusually weak or tired)  muscle injury (dark urine; trouble passing urine or change in the amount of urine; unusually weak or tired; muscle pain; back pain)  pancreatitis (stomach pain that  spreads to your back or gets worse after eating or when touched, fever, nausea, vomiting, fast heartbeat)  redness, blistering, peeling, or loosening of the skin, including inside the mouth  unusual bruising or bleeding Side effects that usually do not require medical attention (report to your doctor or health care professional if they continue or are bothersome):  back pain  constipation  headache This list may not describe all possible side effects. Call your doctor for medical advice about side effects. You may report side effects to FDA at 1-800-FDA-1088. Where should I keep my medicine? Keep out of the reach of children and pets. Store at room temperature between 20 and 25 degrees C (68 and 77 degrees F). Protect from moisture.  Keep the container tightly closed. Get rid of any unused medicine after the expiration date. To get rid of medicines that are no longer needed or have expired:  Take the medicine to a medicine take-back program. Check with your pharmacy or law enforcement to find a location.  If you cannot return the medicine, check the label or package insert to see if the medicine should be thrown out in the garbage or flushed down the toilet. If you are not sure, ask your health care provider. If it is safe to put it in the trash, empty the medicine out of the container. Mix the medicine with cat litter, dirt, coffee grounds, or other unwanted substance. Seal the mixture in a bag or container. Put it in the trash. NOTE: This sheet is a summary. It may not cover all possible information. If you have questions about this medicine, talk to your doctor, pharmacist, or health care provider.  2021 Elsevier/Gold Standard (2020-05-27 15:45:25)

## 2021-02-15 NOTE — Addendum Note (Signed)
Addended by: Truddie Hidden on: 02/15/2021 03:05 PM   Modules accepted: Orders

## 2021-02-28 ENCOUNTER — Other Ambulatory Visit: Payer: Self-pay | Admitting: Cardiology

## 2021-02-28 NOTE — Telephone Encounter (Signed)
Plavix approved and sent 

## 2021-03-06 ENCOUNTER — Telehealth: Payer: Self-pay

## 2021-03-06 NOTE — Telephone Encounter (Signed)
Received a fax from the pharmacy stating the insurance company would prefer Aurora, Levemir, Tyler Aas, or Semglee(ygln).

## 2021-03-06 NOTE — Telephone Encounter (Signed)
Spoke to the patient just now and let her know Dr. Joya Gaskins recommendations. She verbalizes understanding and thanks me for the call back.

## 2021-03-06 NOTE — Telephone Encounter (Signed)
She needs to review this with her primary care physician for which insulin to use

## 2021-03-14 ENCOUNTER — Encounter: Payer: Self-pay | Admitting: General Practice

## 2021-03-21 ENCOUNTER — Encounter: Payer: Managed Care, Other (non HMO) | Admitting: Internal Medicine

## 2021-03-21 ENCOUNTER — Ambulatory Visit (INDEPENDENT_AMBULATORY_CARE_PROVIDER_SITE_OTHER): Payer: Managed Care, Other (non HMO)

## 2021-03-21 DIAGNOSIS — I472 Ventricular tachycardia, unspecified: Secondary | ICD-10-CM

## 2021-03-22 LAB — CUP PACEART REMOTE DEVICE CHECK
Battery Remaining Longevity: 97 mo
Battery Remaining Percentage: 95.5 %
Battery Voltage: 3.07 V
Brady Statistic AP VP Percent: 1 %
Brady Statistic AP VS Percent: 1 %
Brady Statistic AS VP Percent: 0 %
Brady Statistic AS VS Percent: 99 %
Brady Statistic RA Percent Paced: 1 %
Brady Statistic RV Percent Paced: 0 %
Date Time Interrogation Session: 20220405030830
HighPow Impedance: 62 Ohm
Implantable Lead Implant Date: 20220103
Implantable Lead Implant Date: 20220103
Implantable Lead Location: 753859
Implantable Lead Location: 753860
Implantable Pulse Generator Implant Date: 20220103
Lead Channel Impedance Value: 360 Ohm
Lead Channel Impedance Value: 530 Ohm
Lead Channel Pacing Threshold Amplitude: 0.5 V
Lead Channel Pacing Threshold Amplitude: 0.5 V
Lead Channel Pacing Threshold Pulse Width: 0.5 ms
Lead Channel Pacing Threshold Pulse Width: 0.5 ms
Lead Channel Sensing Intrinsic Amplitude: 12 mV
Lead Channel Sensing Intrinsic Amplitude: 4.8 mV
Lead Channel Setting Pacing Amplitude: 3.5 V
Lead Channel Setting Pacing Amplitude: 3.5 V
Lead Channel Setting Pacing Pulse Width: 0.5 ms
Lead Channel Setting Sensing Sensitivity: 0.5 mV
Pulse Gen Serial Number: 111037319

## 2021-03-27 ENCOUNTER — Ambulatory Visit: Payer: Managed Care, Other (non HMO)

## 2021-04-03 NOTE — Progress Notes (Signed)
Remote ICD transmission.   

## 2021-04-05 ENCOUNTER — Other Ambulatory Visit: Payer: Self-pay | Admitting: Cardiology

## 2021-04-12 ENCOUNTER — Ambulatory Visit: Payer: Managed Care, Other (non HMO) | Admitting: Sports Medicine

## 2021-05-07 ENCOUNTER — Encounter (HOSPITAL_COMMUNITY): Payer: Self-pay

## 2021-05-07 ENCOUNTER — Other Ambulatory Visit: Payer: Self-pay

## 2021-05-07 ENCOUNTER — Emergency Department (HOSPITAL_COMMUNITY)
Admission: EM | Admit: 2021-05-07 | Discharge: 2021-05-07 | Disposition: A | Discharge status: Home / Self Care | Payer: Managed Care, Other (non HMO) | Attending: Emergency Medicine | Admitting: Emergency Medicine

## 2021-05-07 ENCOUNTER — Emergency Department (HOSPITAL_COMMUNITY): Payer: Managed Care, Other (non HMO)

## 2021-05-07 DIAGNOSIS — Z87891 Personal history of nicotine dependence: Secondary | ICD-10-CM | POA: Diagnosis not present

## 2021-05-07 DIAGNOSIS — Z7984 Long term (current) use of oral hypoglycemic drugs: Secondary | ICD-10-CM | POA: Diagnosis not present

## 2021-05-07 DIAGNOSIS — I25119 Atherosclerotic heart disease of native coronary artery with unspecified angina pectoris: Secondary | ICD-10-CM | POA: Diagnosis not present

## 2021-05-07 DIAGNOSIS — J40 Bronchitis, not specified as acute or chronic: Secondary | ICD-10-CM | POA: Diagnosis not present

## 2021-05-07 DIAGNOSIS — I110 Hypertensive heart disease with heart failure: Secondary | ICD-10-CM | POA: Diagnosis not present

## 2021-05-07 DIAGNOSIS — R739 Hyperglycemia, unspecified: Secondary | ICD-10-CM

## 2021-05-07 DIAGNOSIS — Z951 Presence of aortocoronary bypass graft: Secondary | ICD-10-CM | POA: Insufficient documentation

## 2021-05-07 DIAGNOSIS — Z79899 Other long term (current) drug therapy: Secondary | ICD-10-CM | POA: Diagnosis not present

## 2021-05-07 DIAGNOSIS — Z955 Presence of coronary angioplasty implant and graft: Secondary | ICD-10-CM | POA: Insufficient documentation

## 2021-05-07 DIAGNOSIS — I5032 Chronic diastolic (congestive) heart failure: Secondary | ICD-10-CM | POA: Insufficient documentation

## 2021-05-07 DIAGNOSIS — E1165 Type 2 diabetes mellitus with hyperglycemia: Secondary | ICD-10-CM | POA: Diagnosis not present

## 2021-05-07 DIAGNOSIS — R0981 Nasal congestion: Secondary | ICD-10-CM | POA: Diagnosis present

## 2021-05-07 DIAGNOSIS — Z20822 Contact with and (suspected) exposure to covid-19: Secondary | ICD-10-CM | POA: Insufficient documentation

## 2021-05-07 LAB — CBC WITH DIFFERENTIAL/PLATELET
Abs Immature Granulocytes: 0.06 10*3/uL (ref 0.00–0.07)
Basophils Absolute: 0.1 10*3/uL (ref 0.0–0.1)
Basophils Relative: 1 %
Eosinophils Absolute: 0.1 10*3/uL (ref 0.0–0.5)
Eosinophils Relative: 1 %
HCT: 35.1 % — ABNORMAL LOW (ref 36.0–46.0)
Hemoglobin: 11.4 g/dL — ABNORMAL LOW (ref 12.0–15.0)
Immature Granulocytes: 1 %
Lymphocytes Relative: 15 %
Lymphs Abs: 1.5 10*3/uL (ref 0.7–4.0)
MCH: 29.6 pg (ref 26.0–34.0)
MCHC: 32.5 g/dL (ref 30.0–36.0)
MCV: 91.2 fL (ref 80.0–100.0)
Monocytes Absolute: 0.7 10*3/uL (ref 0.1–1.0)
Monocytes Relative: 7 %
Neutro Abs: 7.6 10*3/uL (ref 1.7–7.7)
Neutrophils Relative %: 75 %
Platelets: 278 10*3/uL (ref 150–400)
RBC: 3.85 MIL/uL — ABNORMAL LOW (ref 3.87–5.11)
RDW: 12.9 % (ref 11.5–15.5)
WBC: 10 10*3/uL (ref 4.0–10.5)
nRBC: 0 % (ref 0.0–0.2)

## 2021-05-07 LAB — COMPREHENSIVE METABOLIC PANEL
ALT: 20 U/L (ref 0–44)
AST: 19 U/L (ref 15–41)
Albumin: 3.4 g/dL — ABNORMAL LOW (ref 3.5–5.0)
Alkaline Phosphatase: 37 U/L — ABNORMAL LOW (ref 38–126)
Anion gap: 9 (ref 5–15)
BUN: 21 mg/dL — ABNORMAL HIGH (ref 6–20)
CO2: 25 mmol/L (ref 22–32)
Calcium: 9.1 mg/dL (ref 8.9–10.3)
Chloride: 101 mmol/L (ref 98–111)
Creatinine, Ser: 1.26 mg/dL — ABNORMAL HIGH (ref 0.44–1.00)
GFR, Estimated: 49 mL/min — ABNORMAL LOW (ref >60)
Glucose, Bld: 327 mg/dL — ABNORMAL HIGH (ref 70–99)
Potassium: 4.7 mmol/L (ref 3.5–5.1)
Sodium: 135 mmol/L (ref 135–145)
Total Bilirubin: 0.5 mg/dL (ref 0.3–1.2)
Total Protein: 7.5 g/dL (ref 6.5–8.1)

## 2021-05-07 LAB — RESP PANEL BY RT-PCR (FLU A&B, COVID) ARPGX2
Influenza A by PCR: NEGATIVE
Influenza B by PCR: NEGATIVE
SARS Coronavirus 2 by RT PCR: NEGATIVE

## 2021-05-07 MED ORDER — GUAIFENESIN ER 600 MG PO TB12: 600.0000 mg | ORAL_TABLET | Freq: Two times a day (BID) | ORAL | 0 refills | Status: AC

## 2021-05-07 MED ORDER — DOXYCYCLINE HYCLATE 100 MG PO CAPS: 100.0000 mg | ORAL_CAPSULE | Freq: Two times a day (BID) | ORAL | 0 refills | Status: AC

## 2021-05-07 MED ORDER — ALBUTEROL SULFATE HFA 108 (90 BASE) MCG/ACT IN AERS
2.0000 | INHALATION_SPRAY | Freq: Once | RESPIRATORY_TRACT | Status: AC
  Administered 2021-05-07: 2 via RESPIRATORY_TRACT
  Filled 2021-05-07: qty 6.7

## 2021-05-07 NOTE — ED Provider Notes (Signed)
[facility] EMERGENCY DEPARTMENT Provider Note   CSN: [id number] Arrival date & time: 05/07/21  9702     History CC: Cough, congestion  Jasmine Richardson is a 60 y.o. female presented emerged department with cough, congestion, chest tightness for 3 days.  She reports a subjective fever at home, although when she checked the actual temperature there was no fever.  She reports a distant history of smoking but quit a few years ago.  She denies history of asthma or COPD.    HPI     Past Medical History:  Diagnosis Date  . Acute chest pain 05/08/2016  . Angina pectoris ([place]) 09/11/2017  . Burping 05/08/2016  . Chronic diastolic heart failure ([place]) 11/06/2016  . Coronary artery disease involving native coronary artery of native heart with angina pectoris ([place]) 09/12/2015   Overview:  PCI and stent of RCA 2009, last cath 2012 with medical therapy  CABG May 2017  . Emphysema lung ([place]) 09/11/2017  . Essential hypertension 09/12/2015  . GERD (gastroesophageal reflux disease) 05/08/2016  . Hyperlipidemia 09/12/2015  . Mediastinitis 06/28/2016  . Morbid obesity ([place]) 05/08/2016  . S/P CABG (coronary artery bypass graft) 06/03/2016   Overview:  The patient underwent sternal reconstruction on 06/21/16 with pec flaps for mediastinitis from a prior CABG in May 2017. On admission, she was critically ill from sepsis and had altered mental status. She was last seen in clinic on 08/09/16 at which time she was doing well.  . Severe sepsis ([place]) 06/03/2016  . Sinus tachycardia 05/08/2016  . Tobacco use disorder 04/20/2016   Overview:  Quit in May 2017.  [name] Uncontrolled type 2 diabetes mellitus ([place]) 05/08/2016  . Wound, surgical, infected 06/06/2016   Overview:  sternal    Patient Active Problem List   Diagnosis Date Noted  . Ventricular tachycardia ([place]) 12/17/2020  . Unstable angina ([place]) 05/22/2020  . NSTEMI (non-ST elevated myocardial infarction) ([place]) 05/22/2020  . Diabetic foot ulcer ([place])  02/16/2020  . Angina pectoris ([place]) 09/11/2017  . Emphysema lung ([place]) 09/11/2017  . Chronic diastolic heart failure ([place]) 11/06/2016  . Mediastinitis 06/28/2016  . Wound, surgical, infected 06/06/2016  . S/P CABG (coronary artery bypass graft) 06/03/2016  . Severe sepsis ([place]) 06/03/2016  . GERD (gastroesophageal reflux disease) 05/08/2016  . Morbid obesity ([place]) 05/08/2016  . Uncontrolled type 2 diabetes mellitus ([place]) 05/08/2016  . Sinus tachycardia 05/08/2016  . Acute chest pain 05/08/2016  . Burping 05/08/2016  . Tobacco use disorder 04/20/2016  . Coronary artery disease involving native coronary artery of native heart with angina pectoris ([place]) 09/12/2015  . Essential hypertension 09/12/2015  . Hyperlipidemia 09/12/2015    Past Surgical History:  Procedure Laterality Date  . AMPUTATION TOE Right 02/20/2020   Procedure: AMPUTATION RIGHT GREAT  TOE;  Surgeon: Felipa Furnace, DPM;  Location: [place];  Service: Podiatry;  Laterality: Right;  . BLADDER SURGERY    . CARDIAC CATHETERIZATION    . CORONARY ARTERY BYPASS GRAFT    . CORONARY STENT INTERVENTION N/A 05/25/2020   Procedure: CORONARY STENT INTERVENTION;  Surgeon: Wellington Hampshire, MD;  Location: [place] [facility];  Service: Cardiovascular;  Laterality: N/A;  . CORONARY STENT INTERVENTION N/A 06/22/2020   Procedure: CORONARY STENT INTERVENTION;  Surgeon: Leonie Man, MD;  Location: [place] [facility];  Service: Cardiovascular;  Laterality: N/A;  . ICD IMPLANT N/A 12/19/2020   Procedure: ICD IMPLANT;  Surgeon: Evans Lance, MD;  Location: [place] [facility];  Service: Cardiovascular;  Laterality: N/A;  . INCISION AND DRAINAGE Right 02/19/2020   Procedure: INCISION AND DRAINAGE;  Surgeon: Trula Slade, DPM;  Location: [place];  Service: Podiatry;  Laterality: Right;  Block done by surgeon  . INTRAVASCULAR ULTRASOUND/IVUS N/A 06/22/2020   Procedure: Intravascular Ultrasound/IVUS;  Surgeon: Leonie Man, MD;  Location: [place] [facility];  Service: Cardiovascular;  Laterality: N/A;  . LEFT HEART CATH AND CORS/GRAFTS ANGIOGRAPHY N/A 05/23/2020   Procedure: LEFT HEART CATH AND CORS/GRAFTS ANGIOGRAPHY;  Surgeon: Nelva Bush, MD;  Location: [place] [facility];  Service: Cardiovascular;  Laterality: N/A;  . LEFT HEART CATH AND CORS/GRAFTS ANGIOGRAPHY N/A 12/19/2020   Procedure: LEFT HEART CATH AND CORS/GRAFTS ANGIOGRAPHY;  Surgeon: Burnell Blanks, MD;  Location: [place] [facility];  Service: Cardiovascular;  Laterality: N/A;  . METATARSAL HEAD EXCISION Right 05/02/2020   Procedure: FIRST METATARSAL HEAD RESECTION; RIGHT FOOT WOUND CLOSURE;  Surgeon: Landis Martins, DPM;  Location: [place];  Service: Podiatry;  Laterality: Right;  MAC W/LOCAL  . TUBAL LIGATION       OB History   No obstetric history on file.     Family History  Problem Relation Age of Onset  . Hypertension Mother   . Hyperlipidemia Mother   . Heart attack Father   . Heart disease Father   . Hypertension Father   . Alzheimer's disease Father   . Hypertension Brother   . Hyperlipidemia Brother     Social History   Tobacco Use  . Smoking status: Former Smoker    Quit date: 05/2016    Years since quitting: 4.9  . Smokeless tobacco: Never Used  Vaping Use  . Vaping Use: Never used  Substance Use Topics  . Alcohol use: No  . Drug use: No    Home Medications Prior to Admission medications   Medication Sig Start Date End Date Taking? Authorizing Provider  doxycycline (VIBRAMYCIN) 100 MG capsule Take 1 capsule (100 mg total) by mouth 2 (two) times daily for 7 days. 05/07/21 05/14/21 Yes Kiyah Demartini, Carola Rhine, MD  guaiFENesin (MUCINEX) 600 MG 12 hr tablet Take 1 tablet (600 mg total) by mouth 2 (two) times daily. 05/07/21 06/06/21 Yes Patti Shorb, Carola Rhine, MD  amiodarone (PACERONE) 200 MG tablet Take 1 tablet (200 mg total) by mouth daily. 02/14/21   Richardo Priest, MD  aspirin EC 81 MG tablet Take 1 tablet (81 mg total)  by mouth daily. 05/13/20   Richardo Priest, MD  cholecalciferol (VITAMIN D3) 25 MCG (1000 UNIT) tablet Take 2,000 Units by mouth every Saturday.    [provider]  clopidogrel (PLAVIX) 75 MG tablet Take 1 tablet by mouth once daily 04/05/21   Richardo Priest, MD  Cyanocobalamin (VITAMIN B 12 PO) Take 1 tablet by mouth at bedtime.    [provider]  fenofibrate (TRICOR) 145 MG tablet Take 1 tablet (145 mg total) by mouth daily. 02/14/21   Richardo Priest, MD  furosemide (LASIX) 40 MG tablet Take 1 tablet by mouth once daily 04/05/21   Richardo Priest, MD  glipiZIDE (GLUCOTROL XL) 10 MG 24 hr tablet Take 10 mg by mouth 2 (two) times daily. 12/06/20   [provider]  LORazepam (ATIVAN) 1 MG tablet Take 1 mg by mouth daily as needed for anxiety.    [provider]  metFORMIN (GLUCOPHAGE-XR) 500 MG 24 hr tablet Take 1,000 mg by mouth 2 (two) times daily. 02/15/20  [provider]  metoprolol succinate (TOPROL-XL) 50 MG 24 hr tablet Take 1 tablet (50 mg total) by mouth 3 (three) times daily. 02/14/21   Richardo Priest, MD  nitroGLYCERIN (NITROSTAT) 0.4 MG SL tablet DISSOLVE ONE TABLET UNDER THE TONGUE EVERY 5 MINUTES AS NEEDED FOR CHEST PAIN.  DO NOT EXCEED A TOTAL OF 3 DOSES IN 15 MINUTES 01/23/21   Richardo Priest, MD  nystatin cream (MYCOSTATIN) Apply 1 application topically 2 (two) times daily as needed for dry skin (rash). 10/31/20   [provider]  pravastatin (PRAVACHOL) 20 MG tablet TAKE 1 TABLET BY MOUTH ONCE DAILY IN THE EVENING 12/21/20   Richardo Priest, MD  valsartan (DIOVAN) 80 MG tablet Take 1 tablet (80 mg total) by mouth 2 (two) times daily. 01/11/21   Richardo Priest, MD  vitamin C (ASCORBIC ACID) 250 MG tablet Take 250 mg by mouth at bedtime.    [provider]    Allergies    Chlorhexidine gluconate, Tramadol, and Codeine  Review of Systems   Review of Systems  Constitutional: Negative for chills and fever.  HENT: Positive for  congestion, ear pain, rhinorrhea and sinus pressure.   Eyes: Negative for pain and visual disturbance.  Respiratory: Positive for cough, chest tightness and shortness of breath.   Cardiovascular: Negative for chest pain and palpitations.  Gastrointestinal: Negative for abdominal pain and vomiting.  Genitourinary: Negative for dysuria and hematuria.  Musculoskeletal: Negative for arthralgias and back pain.  Skin: Negative for color change and rash.  Neurological: Negative for syncope and light-headedness.  All other systems reviewed and are negative.   Physical Exam Updated Vital Signs BP (!) 145/87 (BP Location: Right Arm)   Pulse 69   Temp 98 F (36.7 C) (Oral)   Resp 18   LMP  (LMP Unknown)   SpO2 98%   Physical Exam Constitutional:      General: She is not in acute distress.    Appearance: She is obese.  HENT:     Head: Normocephalic and atraumatic.  Eyes:     Conjunctiva/sclera: Conjunctivae normal.     Pupils: Pupils are equal, round, and reactive to light.  Cardiovascular:     Rate and Rhythm: Normal rate and regular rhythm.     Pulses: Normal pulses.  Pulmonary:     Effort: Pulmonary effort is normal. No respiratory distress.  Abdominal:     General: There is no distension.     Tenderness: There is no abdominal tenderness.  Skin:    General: Skin is warm and dry.  Neurological:     General: No focal deficit present.     Mental Status: She is alert. Mental status is at baseline.  Psychiatric:        Mood and Affect: Mood normal.        Behavior: Behavior normal.     ED Results / Procedures / Treatments   Labs (all labs ordered are listed, but only abnormal results are displayed) Labs Reviewed  COMPREHENSIVE METABOLIC PANEL - Abnormal; Notable for the following components:      Result Value   Glucose, Bld 327 (*)    BUN 21 (*)    Creatinine, Ser 1.26 (*)    Albumin 3.4 (*)    Alkaline Phosphatase 37 (*)    GFR, Estimated 49 (*)    All other  components within normal limits  CBC WITH DIFFERENTIAL/PLATELET - Abnormal; Notable for the following components:   RBC 3.85 (*)  Hemoglobin 11.4 (*)    HCT 35.1 (*)    All other components within normal limits  RESP PANEL BY RT-PCR (FLU A&B, COVID) [id number]    EKG None  Radiology DG Chest 2 View  Result Date: 05/07/2021 CLINICAL DATA:  Cough and wheezing for 3 days. EXAM: CHEST - 2 VIEW COMPARISON:  12/20/2020 and prior radiographs FINDINGS: Cardiomediastinal silhouette is unchanged. A LEFT-sided pacemaker/AICD again noted. Mild chronic peribronchial thickening is again noted. There is no evidence of focal airspace disease, pulmonary edema, suspicious pulmonary nodule/mass, pleural effusion, or pneumothorax. No acute bony abnormalities are identified. IMPRESSION: No active cardiopulmonary disease. Mild chronic peribronchial thickening. Electronically Signed   By: Margarette Canada M.D.   On: 05/07/2021 08:25    Procedures Procedures   Medications Ordered in ED Medications  albuterol (VENTOLIN HFA) 108 (90 Base) MCG/ACT inhaler 2 puff (2 puffs Inhalation Given 05/07/21 1142)    ED Course  I have reviewed the triage vital signs and the nursing notes.  Pertinent labs & imaging results that were available during my care of the patient were reviewed by me and considered in my medical decision making (see chart for details).  60 year old female who is well-appearing presenting emergency department with cough, congestion, rhinorrhea.  This is very likely a viral syndrome.  She is currently on day 3 of symptoms.  Her COVID test here is negative.  I reviewed her labs which show white blood cell count of 10.0, hemoglobin at baseline, CMP largely unremarkable aside from some hyperglycemia (she did not take her morning insulin today prior to coming in).  No evidence of DKA.  Vital signs reviewed and normal, afebrile, low suspicion for sepsis or bacteremia.  Chest x-ray reviewed showing no focal  consolidation, but peribronchial thickening consistent with bronchitis.  Clinically I do suspect this is a viral bronchitis.  We gave her some albuterol puffs here.  I prescribed Mucinex for home.  We did discuss a watch and wait prescription for antibiotics, which I think is reasonable, and I provided this with doxycycline for home.  She and her husband verbalized understanding of this plan.  We discussed her high blood sugar, and I advised that she get back on her insulin when she returns home.    Final Clinical Impression(s) / ED Diagnoses Final diagnoses:  Bronchitis  Hyperglycemia    Rx / DC Orders ED Discharge Orders         Ordered    doxycycline (VIBRAMYCIN) 100 MG capsule  2 times daily        05/07/21 1135    guaiFENesin (MUCINEX) 600 MG 12 hr tablet  2 times daily        05/07/21 1135           Wyvonnia Dusky, MD 05/07/21 1244

## 2021-05-07 NOTE — ED Triage Notes (Signed)
Patient complains of cough and wheezing x 3 days. Reports low grade fever with same. Patient with wheezing on arrival. NAD

## 2021-05-07 NOTE — Discharge Instructions (Addendum)
If your symptoms have not improved 3 days from now, you begin having fevers, worsening green mucus, you can start taking the doxycycline antibiotic.  You can albuterol pump as needed every 4-6 hours at home.

## 2021-05-16 ENCOUNTER — Encounter: Payer: Self-pay | Admitting: Internal Medicine

## 2021-05-16 ENCOUNTER — Other Ambulatory Visit: Payer: Self-pay

## 2021-05-16 ENCOUNTER — Ambulatory Visit: Payer: Managed Care, Other (non HMO) | Admitting: Internal Medicine

## 2021-05-16 ENCOUNTER — Encounter (INDEPENDENT_AMBULATORY_CARE_PROVIDER_SITE_OTHER): Payer: Self-pay

## 2021-05-16 ENCOUNTER — Ambulatory Visit (INDEPENDENT_AMBULATORY_CARE_PROVIDER_SITE_OTHER): Payer: Managed Care, Other (non HMO) | Admitting: Pharmacist

## 2021-05-16 VITALS — BP 112/70 | HR 72 | Ht 67.0 in | Wt 247.0 lb

## 2021-05-16 DIAGNOSIS — I472 Ventricular tachycardia, unspecified: Secondary | ICD-10-CM

## 2021-05-16 DIAGNOSIS — I25119 Atherosclerotic heart disease of native coronary artery with unspecified angina pectoris: Secondary | ICD-10-CM

## 2021-05-16 DIAGNOSIS — Z951 Presence of aortocoronary bypass graft: Secondary | ICD-10-CM | POA: Diagnosis not present

## 2021-05-16 DIAGNOSIS — E782 Mixed hyperlipidemia: Secondary | ICD-10-CM

## 2021-05-16 DIAGNOSIS — I214 Non-ST elevation (NSTEMI) myocardial infarction: Secondary | ICD-10-CM

## 2021-05-16 DIAGNOSIS — Z95810 Presence of automatic (implantable) cardiac defibrillator: Secondary | ICD-10-CM

## 2021-05-16 HISTORY — DX: Presence of automatic (implantable) cardiac defibrillator: Z95.810

## 2021-05-16 MED ORDER — CEFUROXIME AXETIL 500 MG PO TABS: 500.0000 mg | ORAL_TABLET | Freq: Two times a day (BID) | ORAL | 0 refills | Status: DC

## 2021-05-16 MED ORDER — REPATHA SURECLICK 140 MG/ML ~~LOC~~ SOAJ: 140.0000 mg | SUBCUTANEOUS | 11 refills | Status: DC

## 2021-05-16 NOTE — Patient Instructions (Addendum)
Medication Instructions:  Your physician has recommended you make the following change in your medication:  1.  START taking ceftin 500 mg-  Take one tablet by mouth twice a day for 7 days.   Labwork: None ordered.  Testing/Procedures: None ordered.  Follow-Up: Your physician wants you to follow-up in: one year with Cristopher Peru, MD or one of the following Advanced Practice Providers on your designated Care Team:    Chanetta Marshall, NP  Tommye Standard, PA-C  Legrand Como "Jonni Sanger" [place], [place]  Remote monitoring is used to monitor your ICD from home. This monitoring reduces the number of office visits required to check your device to one time per year. It allows [place] to keep an eye on the functioning of your device to ensure it is working properly. You are scheduled for a device check from home on 06/20/2021. You may send your transmission at any time that day. If you have a wireless device, the transmission will be sent automatically. After your physician reviews your transmission, you will receive a postcard with your next transmission date.  Any Other Special Instructions Will Be Listed Below (If Applicable).  If you need a refill on your cardiac medications before your next appointment, please call your pharmacy.

## 2021-05-16 NOTE — Patient Instructions (Signed)
Nice to see you today!  Keep up the good work with diet and exercise. Aim for a diet full of vegetables, fruit and lean meats (chicken, [place], fish). Try to limit carbs (bread, pasta, sugar, rice) and red meat consumption.  Your goal LDL is <70 mg/dL, you're currently at 197 mg/dL  Medication Changes: Will start either Praluent or Repatha depending on insurance approval. Either one will be taken every 2 weeks. Will call you once medication is ready from pick up from pharmacy.   Continue pravastatin 20 mg daily  Continue fenofibrate 145 mg daily  Please give [place] a call at [phone] with any questions or concerns.  For PCSK9i, inject once every other week (any day of the week that works for you) into the fatty skin of stomach, upper outer thigh or back of the arm. Clean the site with soap and warm water or an alcohol pad. Keep the medication in the fridge until you are ready to give your dose, then take it out and let warm up to room temperature for 30-60 mins.

## 2021-05-16 NOTE — Progress Notes (Signed)
OZDGUYQIH ID: Jasmine Richardson                 DOB: 03/07/1961                    MRN: [id number]     HPI: Jasmine Richardson is a 60 y.o. female patient referred to lipid clinic by Dr. [name]. PMH is significant for CAD s/p ACS and PCI, ventricular tachycardia s/p ICD, DM, HFpEF, HTN, HLD.  Patient presents today in good spirits. Reports medication adherence with pravastatin 20 mg daily and fenofibrate 145 mg daily. Denies muscle aches with pravastatin, but reports numbness in hands possibly secondary to bypass surgery. Reports history of atorvastatin and rosuvastatin which caused muscle aches.   Current Medications: pravastatin 20 mg daily, fenofibrate 145 mg daily Intolerances: atorvastatin 40-80 mg daily and rosuvastatin (myalgia), Vascepa 2 g BID and OTC fish (regurgitation and fishy breath) Risk Factors: CAD s/p ACS and PCI, DM, HTN, HLD LDL goal: <70 mg/dL  Family History: Alzheimer's disease in her father; Heart attack in her father; Heart disease in her father; Hyperlipidemia in her brother and mother; Hypertension in her brother, father, and mother.   Social History: former smoker (Quit 05/2016)  Labs: 02/14/21 TC 351, TG 455, HDL 60, LDL 197 (pravastatin 20 mg)  Past Medical History:  Diagnosis Date  . Acute chest pain 05/08/2016  . Angina pectoris ([place]) 09/11/2017  . Burping 05/08/2016  . Chronic diastolic heart failure ([place]) 11/06/2016  . Coronary artery disease involving native coronary artery of native heart with angina pectoris ([place]) 09/12/2015   Overview:  PCI and stent of RCA 2009, last cath 2012 with medical therapy  CABG May 2017  . Emphysema lung ([place]) 09/11/2017  . Essential hypertension 09/12/2015  . GERD (gastroesophageal reflux disease) 05/08/2016  . Hyperlipidemia 09/12/2015  . Mediastinitis 06/28/2016  . Morbid obesity ([place]) 05/08/2016  . S/P CABG (coronary artery bypass graft) 06/03/2016   Overview:  The patient underwent sternal reconstruction on 06/21/16 with pec flaps for  mediastinitis from a prior CABG in May 2017. On admission, she was critically ill from sepsis and had altered mental status. She was last seen in clinic on 08/09/16 at which time she was doing well.  . Severe sepsis ([place]) 06/03/2016  . Sinus tachycardia 05/08/2016  . Tobacco use disorder 04/20/2016   Overview:  Quit in May 2017.  [name] Uncontrolled type 2 diabetes mellitus ([place]) 05/08/2016  . Wound, surgical, infected 06/06/2016   Overview:  sternal    Current Outpatient Medications on File Prior to Visit  Medication Sig Dispense Refill  . amiodarone (PACERONE) 200 MG tablet Take 1 tablet (200 mg total) by mouth daily. 30 tablet 6  . aspirin EC 81 MG tablet Take 1 tablet (81 mg total) by mouth daily. 90 tablet 3  . cholecalciferol (VITAMIN D3) 25 MCG (1000 UNIT) tablet Take 2,000 Units by mouth every Saturday.    . clopidogrel (PLAVIX) 75 MG tablet Take 1 tablet by mouth once daily 90 tablet 2  . Cyanocobalamin (VITAMIN B 12 PO) Take 1 tablet by mouth at bedtime.    . fenofibrate (TRICOR) 145 MG tablet Take 1 tablet (145 mg total) by mouth daily. 30 tablet 6  . furosemide (LASIX) 40 MG tablet Take 1 tablet by mouth once daily 90 tablet 2  . glipiZIDE (GLUCOTROL XL) 10 MG 24 hr tablet Take 10 mg by mouth 2 (two) times daily.    [name] guaiFENesin (MUCINEX) 600 MG 12 hr  tablet Take 1 tablet (600 mg total) by mouth 2 (two) times daily. 60 tablet 0  . LORazepam (ATIVAN) 1 MG tablet Take 1 mg by mouth daily as needed for anxiety.    . metFORMIN (GLUCOPHAGE-XR) 500 MG 24 hr tablet Take 1,000 mg by mouth 2 (two) times daily.    . metoprolol succinate (TOPROL-XL) 50 MG 24 hr tablet Take 1 tablet (50 mg total) by mouth 3 (three) times daily. 30 tablet 12  . nitroGLYCERIN (NITROSTAT) 0.4 MG SL tablet DISSOLVE ONE TABLET UNDER THE TONGUE EVERY 5 MINUTES AS NEEDED FOR CHEST PAIN.  DO NOT EXCEED A TOTAL OF 3 DOSES IN 15 MINUTES 25 tablet 11  . nystatin cream (MYCOSTATIN) Apply 1 application topically 2 (two) times daily  as needed for dry skin (rash).    . pravastatin (PRAVACHOL) 20 MG tablet TAKE 1 TABLET BY MOUTH ONCE DAILY IN THE EVENING 90 tablet 1  . valsartan (DIOVAN) 80 MG tablet Take 1 tablet (80 mg total) by mouth 2 (two) times daily. 180 tablet 3  . vitamin C (ASCORBIC ACID) 250 MG tablet Take 250 mg by mouth at bedtime.     No current facility-administered medications on file prior to visit.    Allergies  Allergen Reactions  . Chlorhexidine Gluconate Itching    Received CHG bath, began itching, required benadryl  . Tramadol Other (See Comments)    Hallucinations  . Codeine Rash    Assessment/Plan:  1. Hyperlipidemia - LDL above goal <70 mg/dL. Medication adherence appears optimal. History of intolerance with high intensity atorvastatin and rosuvastatin. Discussed clinical benefits of PCSK9-inhibitors and potential side effects. Demonstrated proper injection administration technique with teach-back method. Will start Repatha 140 mg [place] every two weeks. PA approved until 05/16/22. Provided pharmacy with savings card information (RxBin: [id number], [place]: CN, RxGrp: [id number], ID: [id number]). Continued pravastatin 20 mg daily and fenofibrate 145 mg daily. Encouraged patient to aim for a diet full of vegetables, fruit and lean meats (chicken, [place], fish) and to limit carbs (bread, pasta, sugar, rice) and red meat consumption. Encouraged patient to exercise 20-30 minutes daily with the goal of 150 minutes per week. Patient verbalized understanding. Will call patient tomorrow to schedule follow-up fasting lipid panel and LFTs in 3 months.   Lorel Monaco, PharmD, [place] [address] [place], [place] [address] Phone: [phone]; Fax: [phone]

## 2021-05-16 NOTE — Progress Notes (Deleted)
[name]                 DOB: January 28, 1961                    MRN: [id number]     HPI: Rosalea Withrow is a 60 y.o. female patient referred to lipid clinic by Dr. [name]. PMH is significant for CAD s/p ACS and PCI, ventricular tachycardia, DM, HFpEF, HTN, HLD  Current Medications:  Intolerances:  Risk Factors:  LDL goal:   Diet:   Exercise:   Family History:   Social History:   Labs:02/14/21 TC 351, TG 455, HDL 60, LDL 197  Past Medical History:  Diagnosis Date  . Acute chest pain 05/08/2016  . Angina pectoris ([place]) 09/11/2017  . Burping 05/08/2016  . Chronic diastolic heart failure ([place]) 11/06/2016  . Coronary artery disease involving native coronary artery of native heart with angina pectoris ([place]) 09/12/2015   Overview:  PCI and stent of RCA 2009, last cath 2012 with medical therapy  CABG May 2017  . Emphysema lung ([place]) 09/11/2017  . Essential hypertension 09/12/2015  . GERD (gastroesophageal reflux disease) 05/08/2016  . Hyperlipidemia 09/12/2015  . Mediastinitis 06/28/2016  . Morbid obesity ([place]) 05/08/2016  . S/P CABG (coronary artery bypass graft) 06/03/2016   Overview:  The patient underwent sternal reconstruction on 06/21/16 with pec flaps for mediastinitis from a prior CABG in May 2017. On admission, she was critically ill from sepsis and had altered mental status. She was last seen in clinic on 08/09/16 at which time she was doing well.  . Severe sepsis ([place]) 06/03/2016  . Sinus tachycardia 05/08/2016  . Tobacco use disorder 04/20/2016   Overview:  Quit in May 2017.  [name] Uncontrolled type 2 diabetes mellitus ([place]) 05/08/2016  . Wound, surgical, infected 06/06/2016   Overview:  sternal    Current Outpatient Medications on File Prior to Visit  Medication Sig Dispense Refill  . amiodarone (PACERONE) 200 MG tablet Take 1 tablet (200 mg total) by mouth daily. 30 tablet 6  . aspirin EC 81 MG tablet Take 1 tablet (81 mg total) by mouth daily. 90 tablet 3  .  cholecalciferol (VITAMIN D3) 25 MCG (1000 UNIT) tablet Take 2,000 Units by mouth every Saturday.    . clopidogrel (PLAVIX) 75 MG tablet Take 1 tablet by mouth once daily 90 tablet 2  . Cyanocobalamin (VITAMIN B 12 PO) Take 1 tablet by mouth at bedtime.    . fenofibrate (TRICOR) 145 MG tablet Take 1 tablet (145 mg total) by mouth daily. 30 tablet 6  . furosemide (LASIX) 40 MG tablet Take 1 tablet by mouth once daily 90 tablet 2  . glipiZIDE (GLUCOTROL XL) 10 MG 24 hr tablet Take 10 mg by mouth 2 (two) times daily.    [name] guaiFENesin (MUCINEX) 600 MG 12 hr tablet Take 1 tablet (600 mg total) by mouth 2 (two) times daily. 60 tablet 0  . LORazepam (ATIVAN) 1 MG tablet Take 1 mg by mouth daily as needed for anxiety.    . metFORMIN (GLUCOPHAGE-XR) 500 MG 24 hr tablet Take 1,000 mg by mouth 2 (two) times daily.    . metoprolol succinate (TOPROL-XL) 50 MG 24 hr tablet Take 1 tablet (50 mg total) by mouth 3 (three) times daily. 30 tablet 12  . nitroGLYCERIN (NITROSTAT) 0.4 MG SL tablet DISSOLVE ONE TABLET UNDER THE TONGUE EVERY 5 MINUTES AS NEEDED FOR CHEST PAIN.  DO NOT EXCEED A TOTAL OF  3 DOSES IN 15 MINUTES 25 tablet 11  . nystatin cream (MYCOSTATIN) Apply 1 application topically 2 (two) times daily as needed for dry skin (rash).    . pravastatin (PRAVACHOL) 20 MG tablet TAKE 1 TABLET BY MOUTH ONCE DAILY IN THE EVENING 90 tablet 1  . valsartan (DIOVAN) 80 MG tablet Take 1 tablet (80 mg total) by mouth 2 (two) times daily. 180 tablet 3  . vitamin C (ASCORBIC ACID) 250 MG tablet Take 250 mg by mouth at bedtime.     No current facility-administered medications on file prior to visit.    Allergies  Allergen Reactions  . Chlorhexidine Gluconate Itching    Received CHG bath, began itching, required benadryl  . Tramadol Other (See Comments)    Hallucinations  . Codeine Rash    Assessment/Plan:  1. Hyperlipidemia -    Thank you,   Ramond Dial, Pharm.D, BCPS, CPP [place]  [address], [place], [place] [address]  Phone: [phone]; Fax: [phone]

## 2021-05-17 NOTE — Progress Notes (Signed)
HPI Jasmine Richardson is a pleasant 60 yo woman with an ICM, s/[p CABG in 2017 who presented with VT in January 2022. She underwent ICD insertion and returns today for followup. She has a h/o obesity and has not lost any weight. She has class 2 CHF. She denies any ICD therapies. She denies medical or dietary non-compliance. Allergies  Allergen Reactions  . Chlorhexidine Gluconate Itching    Received CHG bath, began itching, required benadryl  . Tramadol Other (See Comments)    Hallucinations  . Codeine Rash     Current Outpatient Medications  Medication Sig Dispense Refill  . amiodarone (PACERONE) 200 MG tablet Take 1 tablet (200 mg total) by mouth daily. 30 tablet 6  . aspirin EC 81 MG tablet Take 1 tablet (81 mg total) by mouth daily. 90 tablet 3  . cefUROXime (CEFTIN) 500 MG tablet Take 1 tablet (500 mg total) by mouth 2 (two) times daily with a meal. 14 tablet 0  . cholecalciferol (VITAMIN D3) 25 MCG (1000 UNIT) tablet Take 2,000 Units by mouth every Saturday.    . clopidogrel (PLAVIX) 75 MG tablet Take 1 tablet by mouth once daily 90 tablet 2  . Cyanocobalamin (VITAMIN B 12 PO) Take 1 tablet by mouth at bedtime.    . fenofibrate (TRICOR) 145 MG tablet Take 1 tablet (145 mg total) by mouth daily. 30 tablet 6  . furosemide (LASIX) 40 MG tablet Take 1 tablet by mouth once daily 90 tablet 2  . glipiZIDE (GLUCOTROL XL) 10 MG 24 hr tablet Take 10 mg by mouth 2 (two) times daily.    [name] guaiFENesin (MUCINEX) 600 MG 12 hr tablet Take 1 tablet (600 mg total) by mouth 2 (two) times daily. 60 tablet 0  . LORazepam (ATIVAN) 1 MG tablet Take 1 mg by mouth daily as needed for anxiety.    . metFORMIN (GLUCOPHAGE-XR) 500 MG 24 hr tablet Take 1,000 mg by mouth 2 (two) times daily.    . metoprolol succinate (TOPROL-XL) 50 MG 24 hr tablet Take 1 tablet (50 mg total) by mouth 3 (three) times daily. 30 tablet 12  . nitroGLYCERIN (NITROSTAT) 0.4 MG SL tablet DISSOLVE ONE TABLET UNDER THE TONGUE EVERY 5  MINUTES AS NEEDED FOR CHEST PAIN.  DO NOT EXCEED A TOTAL OF 3 DOSES IN 15 MINUTES 25 tablet 11  . nystatin cream (MYCOSTATIN) Apply 1 application topically 2 (two) times daily as needed for dry skin (rash).    . pravastatin (PRAVACHOL) 20 MG tablet TAKE 1 TABLET BY MOUTH ONCE DAILY IN THE EVENING 90 tablet 1  . valsartan (DIOVAN) 80 MG tablet Take 1 tablet (80 mg total) by mouth 2 (two) times daily. 180 tablet 3  . vitamin C (ASCORBIC ACID) 250 MG tablet Take 250 mg by mouth at bedtime.    . Evolocumab (REPATHA SURECLICK) 272 MG/ML SOAJ Inject 140 mg into the skin every 14 (fourteen) days. 2 mL 11   No current facility-administered medications for this visit.     Past Medical History:  Diagnosis Date  . Acute chest pain 05/08/2016  . Angina pectoris ([place]) 09/11/2017  . Burping 05/08/2016  . Chronic diastolic heart failure ([place]) 11/06/2016  . Coronary artery disease involving native coronary artery of native heart with angina pectoris ([place]) 09/12/2015   Overview:  PCI and stent of RCA 2009, last cath 2012 with medical therapy  CABG May 2017  . Emphysema lung ([place]) 09/11/2017  . Essential hypertension 09/12/2015  .  GERD (gastroesophageal reflux disease) 05/08/2016  . Hyperlipidemia 09/12/2015  . Mediastinitis 06/28/2016  . Morbid obesity ([place]) 05/08/2016  . S/P CABG (coronary artery bypass graft) 06/03/2016   Overview:  The patient underwent sternal reconstruction on 06/21/16 with pec flaps for mediastinitis from a prior CABG in May 2017. On admission, she was critically ill from sepsis and had altered mental status. She was last seen in clinic on 08/09/16 at which time she was doing well.  . Severe sepsis ([place]) 06/03/2016  . Sinus tachycardia 05/08/2016  . Tobacco use disorder 04/20/2016   Overview:  Quit in May 2017.  [name] Uncontrolled type 2 diabetes mellitus ([place]) 05/08/2016  . Wound, surgical, infected 06/06/2016   Overview:  sternal    ROS:   All systems reviewed and negative except as noted in  the HPI.   Past Surgical History:  Procedure Laterality Date  . AMPUTATION TOE Right 02/20/2020   Procedure: AMPUTATION RIGHT GREAT  TOE;  Surgeon: Felipa Furnace, DPM;  Location: [facility];  Service: Podiatry;  Laterality: Right;  . BLADDER SURGERY    . CARDIAC CATHETERIZATION    . CORONARY ARTERY BYPASS GRAFT    . CORONARY STENT INTERVENTION N/A 05/25/2020   Procedure: CORONARY STENT INTERVENTION;  Surgeon: Wellington Hampshire, MD;  Location: [facility];  Service: Cardiovascular;  Laterality: N/A;  . CORONARY STENT INTERVENTION N/A 06/22/2020   Procedure: CORONARY STENT INTERVENTION;  Surgeon: Leonie Man, MD;  Location: [facility];  Service: Cardiovascular;  Laterality: N/A;  . ICD IMPLANT N/A 12/19/2020   Procedure: ICD IMPLANT;  Surgeon: Evans Lance, MD;  Location: [facility];  Service: Cardiovascular;  Laterality: N/A;  . INCISION AND DRAINAGE Right 02/19/2020   Procedure: INCISION AND DRAINAGE;  Surgeon: Trula Slade, DPM;  Location: [facility];  Service: Podiatry;  Laterality: Right;  Block done by surgeon  . INTRAVASCULAR ULTRASOUND/IVUS N/A 06/22/2020   Procedure: Intravascular Ultrasound/IVUS;  Surgeon: Leonie Man, MD;  Location: [facility];  Service: Cardiovascular;  Laterality: N/A;  . LEFT HEART CATH AND CORS/GRAFTS ANGIOGRAPHY N/A 05/23/2020   Procedure: LEFT HEART CATH AND CORS/GRAFTS ANGIOGRAPHY;  Surgeon: Nelva Bush, MD;  Location: [facility];  Service: Cardiovascular;  Laterality: N/A;  . LEFT HEART CATH AND CORS/GRAFTS ANGIOGRAPHY N/A 12/19/2020   Procedure: LEFT HEART CATH AND CORS/GRAFTS ANGIOGRAPHY;  Surgeon: Burnell Blanks, MD;  Location: [facility];  Service: Cardiovascular;  Laterality: N/A;  . METATARSAL HEAD EXCISION Right 05/02/2020   Procedure: FIRST METATARSAL HEAD RESECTION; RIGHT FOOT WOUND CLOSURE;  Surgeon: Landis Martins, DPM;  Location: [facility];  Service: Podiatry;  Laterality:  Right;  MAC W/LOCAL  . TUBAL LIGATION       Family History  Problem Relation Age of Onset  . Hypertension Mother   . Hyperlipidemia Mother   . Heart attack Father   . Heart disease Father   . Hypertension Father   . Alzheimer's disease Father   . Hypertension Brother   . Hyperlipidemia Brother      Social History   Socioeconomic History  . Marital status: Married    Spouse name: Not on file  . Number of children: Not on file  . Years of education: Not on file  . Highest education level: Not on file  Occupational History  . Not on file  Tobacco Use  . Smoking status: Former Smoker    Quit date: 05/2016  Years since quitting: 5.0  . Smokeless tobacco: Never Used  Vaping Use  . Vaping Use: Never used  Substance and Sexual Activity  . Alcohol use: No  . Drug use: No  . Sexual activity: Yes  Other Topics Concern  . Not on file  Social History Narrative  . Not on file   Social Determinants of Health   Financial Resource Strain: Not on file  Food Insecurity: Not on file  Transportation Needs: Not on file  Physical Activity: Not on file  Stress: Not on file  Social Connections: Not on file  Intimate Partner Violence: Not on file     BP 112/70   Pulse 72   Ht _0  (1.702 m)   Wt 247 lb (112 kg)   LMP  (LMP Unknown)   SpO2 96%   BMI 38.69 kg/m   Physical Exam:  Obese appearing middle aged woman, NAD HEENT: Unremarkable Neck:  6 cm JVD, no thyromegally Lymphatics:  No adenopathy Back:  No CVA tenderness Lungs:  Clear with no wheezes HEART:  Regular rate rhythm, no murmurs, no rubs, no clicks Abd:  soft, positive bowel sounds, no organomegally, no rebound, no guarding Ext:  2 plus pulses, no edema, no cyanosis, no clubbing Skin:  No rashes no nodules Neuro:  CN II through XII intact, motor grossly intact  EKG - nsr with RBBB and old anteroseptal MI  DEVICE  Normal device function.  See PaceArt for details.   Assess/Plan: 1. CAD - she denies  anginal symptoms. She is s/p CABG. No change in her meds. 2. VT - she has not had any additional sustained VT. She will continue amiodarone. 3. Chronic systolic heart failure - her symptoms are class 2. She will continue her lasix and maintain a low sodium diet. 4. Obesity - I encouraged the patient to lose weight.  5. ICD - Her St. Jude DDD ICD is working normally. We will follow.  Jasmine Overlie Aamari Strawderman,MD

## 2021-05-23 NOTE — Progress Notes (Signed)
Cardiology Office Note:    Date:  05/24/2021   ID:  Jasmine Richardson, DOB [birth year], MRN [id number]  PCP:  Nicholos Johns, MD  Cardiologist:  Shirlee More, MD    Referring MD: Ernestene Kiel, MD she is on amiodarone, she tells me she has labs in your office in about a month please also do a CMP TSH free T3 and free T4 with amiodarone with a copy to me.   ASSESSMENT:    1. Coronary artery disease involving native coronary artery of native heart with angina pectoris ([place])   2. S/P CABG (coronary artery bypass graft)   3. Ventricular tachycardia ([place])   4. On amiodarone therapy   5. ICD (implantable cardioverter-defibrillator) in place   6. Hypertensive heart disease with chronic diastolic congestive heart failure ([place])   7. Mixed hyperlipidemia    PLAN:    In order of problems listed above:  1. Overall she is doing better stable CAD having no angina after bypass surgery and PCI multivessel continue her long-term dual antiplatelet therapy beta-blocker and multidrug lipid-lowering treatment including high intensity statin and PCSK9 inhibitor. 2. Stable arrhythmia no recurrent VT on device telemetry she is on low-dose amiodarone no signs of toxicity. 3. Stable function has been followed up with EP and device clinic 4. Heart failure is compensated no fluid overload continue her current loop diuretic beta-blocker and ARB.  I personally looked at her chest x-ray does not show heart failure. 5. Continue multidrug regimen she tells me she is due for follow-up labs PCP in about 4 to 6 weeks   Next appointment: 4 months   Medication Adjustments/Labs and Tests Ordered: Current medicines are reviewed at length with the patient today.  Concerns regarding medicines are outlined above.  No orders of the defined types were placed in this encounter.  No orders of the defined types were placed in this encounter.   Chief Complaint  Patient presents with  . Follow-up    History of Present  Illness:    Jasmine Richardson is a 60 y.o. female with a hx of CAD with CABG 3149 complicated by postoperative mediastinitis reoperation at [facility] with myocutaneous pectoral flaps for closure, hypertensive heart disease with chronic diastolic heart failure and hyperlipidemia.  She presented with ACS and PCI and stent native right coronary artery 05/25/2020 with occlusion of the saphenous vein graft.  Following this she had a complex PCI and stent to left main coronary artery with shockwave intravascular lithotripsy 06/22/2020 with dissection and occlusion of a small caliber first diagonal branch.  She was seen 01/11/2021 following a recent hospitalization.  She was admitted to the hospital 12/17/2020 with wide complex tachycardia, and ventricular tachycardia treated with IV amiodarone and lidocaine and subsequently cardioverted back to sinus rhythm. She was maintained and loaded with amiodarone. Repeat echocardiogram showed ejection fraction 45 to 50% left heart catheterization was performed and there is no change in previous severe CAD and recent stents were patent and there was no culprit or target lesion for intervention.  Subsequently with ventricular tachycardia and CAD she had an ICD placed.  Her thyroid studies were normal initiating amiodarone 12/17/2020 She was last seen 02/14/2021. Her last remote check 03/21/2021 showed normal lead and battery parameters and no recurrent VT or VF.  She was seen by Dr. Lovena Le EP 05/16/2021 Compliance with diet, lifestyle and medications: Yes  She was in the emergency room [facility] 05/07/2021 with cough congestion fever and chest tightness. Chest x-ray showed no  active cardiopulmonary disease she had peribronchial thickening chronic lung disease she told me she was to tell me that her lungs were wet but she was not given an increased dose of the diuretic.  Laboratory studies showed a hemoglobin 11.4 hematocrit 35.1 creatinine 1.26 GFR 49 cc.  She  was felt to have had bronchitis her COVID test was negative she was given a bronchodilator in the emergency room and discharged on antibiotic therapy with doxycycline.  She has recovered from respiratory infection and is not having shortness of breath cough chest pain fever chills.  No angina edema palpitation or syncope. Past Medical History:  Diagnosis Date  . Acute chest pain 05/08/2016  . Angina pectoris ([place]) 09/11/2017  . Burping 05/08/2016  . Chronic diastolic heart failure ([place]) 11/06/2016  . Coronary artery disease involving native coronary artery of native heart with angina pectoris ([place]) 09/12/2015   Overview:  PCI and stent of RCA 2009, last cath 2012 with medical therapy  CABG May 2017  . Emphysema lung ([place]) 09/11/2017  . Essential hypertension 09/12/2015  . GERD (gastroesophageal reflux disease) 05/08/2016  . Hyperlipidemia 09/12/2015  . Mediastinitis 06/28/2016  . Morbid obesity ([place]) 05/08/2016  . S/P CABG (coronary artery bypass graft) 06/03/2016   Overview:  The patient underwent sternal reconstruction on 06/21/16 with pec flaps for mediastinitis from a prior CABG in May 2017. On admission, she was critically ill from sepsis and had altered mental status. She was last seen in clinic on 08/09/16 at which time she was doing well.  . Severe sepsis ([place]) 06/03/2016  . Sinus tachycardia 05/08/2016  . Tobacco use disorder 04/20/2016   Overview:  Quit in May 2017.  [name] Uncontrolled type 2 diabetes mellitus ([place]) 05/08/2016  . Wound, surgical, infected 06/06/2016   Overview:  sternal    Past Surgical History:  Procedure Laterality Date  . AMPUTATION TOE Right 02/20/2020   Procedure: AMPUTATION RIGHT GREAT  TOE;  Surgeon: Felipa Furnace, DPM;  Location: [facility];  Service: Podiatry;  Laterality: Right;  . BLADDER SURGERY    . CARDIAC CATHETERIZATION    . CORONARY ARTERY BYPASS GRAFT    . CORONARY STENT INTERVENTION N/A 05/25/2020   Procedure: CORONARY STENT INTERVENTION;  Surgeon: Wellington Hampshire,  MD;  Location: [facility];  Service: Cardiovascular;  Laterality: N/A;  . CORONARY STENT INTERVENTION N/A 06/22/2020   Procedure: CORONARY STENT INTERVENTION;  Surgeon: Leonie Man, MD;  Location: [facility];  Service: Cardiovascular;  Laterality: N/A;  . ICD IMPLANT N/A 12/19/2020   Procedure: ICD IMPLANT;  Surgeon: Evans Lance, MD;  Location: [facility];  Service: Cardiovascular;  Laterality: N/A;  . INCISION AND DRAINAGE Right 02/19/2020   Procedure: INCISION AND DRAINAGE;  Surgeon: Trula Slade, DPM;  Location: [facility];  Service: Podiatry;  Laterality: Right;  Block done by surgeon  . INTRAVASCULAR ULTRASOUND/IVUS N/A 06/22/2020   Procedure: Intravascular Ultrasound/IVUS;  Surgeon: Leonie Man, MD;  Location: [facility];  Service: Cardiovascular;  Laterality: N/A;  . LEFT HEART CATH AND CORS/GRAFTS ANGIOGRAPHY N/A 05/23/2020   Procedure: LEFT HEART CATH AND CORS/GRAFTS ANGIOGRAPHY;  Surgeon: Nelva Bush, MD;  Location: [facility];  Service: Cardiovascular;  Laterality: N/A;  . LEFT HEART CATH AND CORS/GRAFTS ANGIOGRAPHY N/A 12/19/2020   Procedure: LEFT HEART CATH AND CORS/GRAFTS ANGIOGRAPHY;  Surgeon: Burnell Blanks, MD;  Location: [facility];  Service: Cardiovascular;  Laterality: N/A;  . METATARSAL HEAD  EXCISION Right 05/02/2020   Procedure: FIRST METATARSAL HEAD RESECTION; RIGHT FOOT WOUND CLOSURE;  Surgeon: Landis Martins, DPM;  Location: [facility];  Service: Podiatry;  Laterality: Right;  MAC W/LOCAL  . TUBAL LIGATION      Current Medications: Current Meds  Medication Sig  . amiodarone (PACERONE) 200 MG tablet Take 1 tablet (200 mg total) by mouth daily.  [name] aspirin EC 81 MG tablet Take 1 tablet (81 mg total) by mouth daily.     Allergies:   Chlorhexidine gluconate, Tramadol, and Codeine   Social History   Socioeconomic History  . Marital status: Married    Spouse name: Not on file  . Number of  children: Not on file  . Years of education: Not on file  . Highest education level: Not on file  Occupational History  . Not on file  Tobacco Use  . Smoking status: Former Smoker    Quit date: 05/2016    Years since quitting: 5.0  . Smokeless tobacco: Never Used  Vaping Use  . Vaping Use: Never used  Substance and Sexual Activity  . Alcohol use: No  . Drug use: No  . Sexual activity: Yes  Other Topics Concern  . Not on file  Social History Narrative  . Not on file   Social Determinants of Health   Financial Resource Strain: Not on file  Food Insecurity: Not on file  Transportation Needs: Not on file  Physical Activity: Not on file  Stress: Not on file  Social Connections: Not on file     Family History: The patient's family history includes Alzheimer's disease in her father; Heart attack in her father; Heart disease in her father; Hyperlipidemia in her brother and mother; Hypertension in her brother, father, and mother. ROS:   Please see the history of present illness.    All other systems reviewed and are negative.  EKGs/Labs/Other Studies Reviewed:    The following studies were reviewed today:  EKG:  EKG ordered today and personally reviewed.  The ekg ordered today demonstrates sinus rhythm left atrial enlargement first-degree AV block right bundle branch block old inferior apical anterior septal MI unchanged from previous EKG 05/16/2021  Recent Labs: 12/18/2020: B Natriuretic Peptide 113.2 12/20/2020: Magnesium 2.0 01/12/2021: NT-Pro BNP 246; TSH 2.790 05/07/2021: ALT 20; BUN 21; Creatinine, Ser 1.26; Hemoglobin 11.4; Platelets 278; Potassium 4.7; Sodium 135  Recent Lipid Panel    Component Value Date/Time   CHOL 351 (H) 02/14/2021 1042   TRIG 455 (H) 02/14/2021 1042   HDL 60 02/14/2021 1042   CHOLHDL 5.9 (H) 02/14/2021 1042   CHOLHDL 5.6 05/22/2020 2122   VLDL UNABLE TO CALCULATE IF TRIGLYCERIDE OVER 400 mg/dL 05/22/2020 2122   LDLCALC 197 (H) 02/14/2021 1042    LDLDIRECT 91.4 05/22/2020 2122    Physical Exam:    VS:  BP 120/80 (BP Location: Right Arm, Patient Position: Sitting, Cuff Size: Normal)   Pulse 62   Ht _0  (1.702 m)   Wt 248 lb (112.5 kg)   LMP  (LMP Unknown)   SpO2 98%   BMI 38.84 kg/m     Wt Readings from Last 3 Encounters:  05/24/21 248 lb (112.5 kg)  05/16/21 247 lb (112 kg)  02/14/21 239 lb 12.8 oz (108.8 kg)     GEN:  Well nourished, well developed in no acute distress HEENT: Normal NECK: No JVD; No carotid bruits LYMPHATICS: No lymphadenopathy CARDIAC: RRR, no murmurs, rubs, gallops RESPIRATORY:  Clear to auscultation  without rales, wheezing or rhonchi  ABDOMEN: Soft, non-tender, non-distended MUSCULOSKELETAL:  No edema; No deformity  SKIN: Warm and dry NEUROLOGIC:  Alert and oriented x 3 PSYCHIATRIC:  Normal affect    Signed, Shirlee More, MD  05/24/2021 11:35 AM    [place]

## 2021-05-24 ENCOUNTER — Other Ambulatory Visit: Payer: Self-pay

## 2021-05-24 ENCOUNTER — Ambulatory Visit (INDEPENDENT_AMBULATORY_CARE_PROVIDER_SITE_OTHER): Payer: Managed Care, Other (non HMO) | Admitting: Cardiology

## 2021-05-24 ENCOUNTER — Encounter: Payer: Self-pay | Admitting: Cardiology

## 2021-05-24 VITALS — BP 120/80 | HR 62 | Ht 67.0 in | Wt 248.0 lb

## 2021-05-24 DIAGNOSIS — I472 Ventricular tachycardia, unspecified: Secondary | ICD-10-CM

## 2021-05-24 DIAGNOSIS — Z79899 Other long term (current) drug therapy: Secondary | ICD-10-CM

## 2021-05-24 DIAGNOSIS — I5032 Chronic diastolic (congestive) heart failure: Secondary | ICD-10-CM

## 2021-05-24 DIAGNOSIS — I110 Hypertensive heart disease with heart failure: Secondary | ICD-10-CM

## 2021-05-24 DIAGNOSIS — I25119 Atherosclerotic heart disease of native coronary artery with unspecified angina pectoris: Secondary | ICD-10-CM

## 2021-05-24 DIAGNOSIS — E782 Mixed hyperlipidemia: Secondary | ICD-10-CM

## 2021-05-24 DIAGNOSIS — Z951 Presence of aortocoronary bypass graft: Secondary | ICD-10-CM

## 2021-05-24 DIAGNOSIS — Z95810 Presence of automatic (implantable) cardiac defibrillator: Secondary | ICD-10-CM

## 2021-05-24 NOTE — Patient Instructions (Signed)
Medication Instructions:  Your physician recommends that you continue on your current medications as directed. Please refer to the Current Medication list given to you today.  *If you need a refill on your cardiac medications before your next appointment, please call your pharmacy*   Lab Work: None If you have labs (blood work) drawn today and your tests are completely normal, you will receive your results only by: [name] MyChart Message (if you have MyChart) OR . A paper copy in the mail If you have any lab test that is abnormal or we need to change your treatment, we will call you to review the results.   Testing/Procedures: None   Follow-Up: At [facility], you and your health needs are our priority.  As part of our continuing mission to provide you with exceptional heart care, we have created designated Provider Care Teams.  These Care Teams include your primary Cardiologist (physician) and Advanced Practice Providers (APPs -  Physician Assistants and Nurse Practitioners) who all work together to provide you with the care you need, when you need it.  We recommend signing up for the patient portal called "MyChart".  Sign up information is provided on this After Visit Summary.  MyChart is used to connect with patients for Virtual Visits (Telemedicine).  Patients are able to view lab/test results, encounter notes, upcoming appointments, etc.  Non-urgent messages can be sent to your provider as well.   To learn more about what you can do with MyChart, go to [email or web address].    Your next appointment:   4 month(s)  The format for your next appointment:   In Person  Provider:   Shirlee More, MD   Other Instructions

## 2021-06-20 ENCOUNTER — Ambulatory Visit (INDEPENDENT_AMBULATORY_CARE_PROVIDER_SITE_OTHER): Payer: Managed Care, Other (non HMO)

## 2021-06-20 DIAGNOSIS — I472 Ventricular tachycardia, unspecified: Secondary | ICD-10-CM

## 2021-06-21 LAB — CUP PACEART REMOTE DEVICE CHECK
Battery Remaining Longevity: 107 mo
Battery Remaining Percentage: 94 %
Battery Voltage: 3.04 V
Brady Statistic AP VP Percent: 1 %
Brady Statistic AP VS Percent: 1.5 %
Brady Statistic AS VP Percent: 0 %
Brady Statistic AS VS Percent: 98 %
Brady Statistic RA Percent Paced: 1.5 %
Brady Statistic RV Percent Paced: 1 %
Date Time Interrogation Session: 20220705020012
HighPow Impedance: 68 Ohm
Implantable Lead Implant Date: 20220103
Implantable Lead Implant Date: 20220103
Implantable Lead Location: 753859
Implantable Lead Location: 753860
Implantable Pulse Generator Implant Date: 20220103
Lead Channel Impedance Value: 380 Ohm
Lead Channel Impedance Value: 500 Ohm
Lead Channel Pacing Threshold Amplitude: 0.75 V
Lead Channel Pacing Threshold Amplitude: 1 V
Lead Channel Pacing Threshold Pulse Width: 0.5 ms
Lead Channel Pacing Threshold Pulse Width: 0.5 ms
Lead Channel Sensing Intrinsic Amplitude: 12 mV
Lead Channel Sensing Intrinsic Amplitude: 5 mV
Lead Channel Setting Pacing Amplitude: 2 V
Lead Channel Setting Pacing Amplitude: 2.5 V
Lead Channel Setting Pacing Pulse Width: 0.5 ms
Lead Channel Setting Sensing Sensitivity: 0.5 mV
Pulse Gen Serial Number: 111037319

## 2021-07-11 NOTE — Progress Notes (Signed)
Remote ICD transmission.   

## 2021-07-16 ENCOUNTER — Other Ambulatory Visit: Payer: Self-pay | Admitting: Cardiology

## 2021-07-20 ENCOUNTER — Other Ambulatory Visit: Payer: Self-pay

## 2021-07-20 ENCOUNTER — Emergency Department (HOSPITAL_COMMUNITY): Payer: Managed Care, Other (non HMO)

## 2021-07-20 ENCOUNTER — Encounter (HOSPITAL_COMMUNITY): Payer: Self-pay

## 2021-07-20 ENCOUNTER — Inpatient Hospital Stay (HOSPITAL_COMMUNITY): Payer: Managed Care, Other (non HMO)

## 2021-07-20 ENCOUNTER — Inpatient Hospital Stay (HOSPITAL_COMMUNITY)
Admission: EM | Admit: 2021-07-20 | Discharge: 2021-07-22 | DRG: 281 | Disposition: A | Discharge status: Home / Self Care | Payer: Managed Care, Other (non HMO) | Attending: Student in an Organized Health Care Education/Training Program | Admitting: Student in an Organized Health Care Education/Training Program

## 2021-07-20 DIAGNOSIS — T82855A Stenosis of coronary artery stent, initial encounter: Secondary | ICD-10-CM | POA: Diagnosis present

## 2021-07-20 DIAGNOSIS — I5042 Chronic combined systolic (congestive) and diastolic (congestive) heart failure: Secondary | ICD-10-CM | POA: Diagnosis present

## 2021-07-20 DIAGNOSIS — Z8249 Family history of ischemic heart disease and other diseases of the circulatory system: Secondary | ICD-10-CM

## 2021-07-20 DIAGNOSIS — I472 Ventricular tachycardia: Secondary | ICD-10-CM | POA: Diagnosis present

## 2021-07-20 DIAGNOSIS — Y831 Surgical operation with implant of artificial internal device as the cause of abnormal reaction of the patient, or of later complication, without mention of misadventure at the time of the procedure: Secondary | ICD-10-CM | POA: Diagnosis present

## 2021-07-20 DIAGNOSIS — I248 Other forms of acute ischemic heart disease: Secondary | ICD-10-CM | POA: Diagnosis not present

## 2021-07-20 DIAGNOSIS — I200 Unstable angina: Secondary | ICD-10-CM | POA: Diagnosis present

## 2021-07-20 DIAGNOSIS — I2510 Atherosclerotic heart disease of native coronary artery without angina pectoris: Secondary | ICD-10-CM | POA: Diagnosis not present

## 2021-07-20 DIAGNOSIS — R079 Chest pain, unspecified: Secondary | ICD-10-CM | POA: Diagnosis not present

## 2021-07-20 DIAGNOSIS — Z95810 Presence of automatic (implantable) cardiac defibrillator: Secondary | ICD-10-CM

## 2021-07-20 DIAGNOSIS — N183 Chronic kidney disease, stage 3 unspecified: Secondary | ICD-10-CM | POA: Diagnosis present

## 2021-07-20 DIAGNOSIS — E782 Mixed hyperlipidemia: Secondary | ICD-10-CM

## 2021-07-20 DIAGNOSIS — E7800 Pure hypercholesterolemia, unspecified: Secondary | ICD-10-CM | POA: Diagnosis not present

## 2021-07-20 DIAGNOSIS — Z7982 Long term (current) use of aspirin: Secondary | ICD-10-CM

## 2021-07-20 DIAGNOSIS — I214 Non-ST elevation (NSTEMI) myocardial infarction: Principal | ICD-10-CM | POA: Diagnosis present

## 2021-07-20 DIAGNOSIS — Z951 Presence of aortocoronary bypass graft: Secondary | ICD-10-CM | POA: Diagnosis not present

## 2021-07-20 DIAGNOSIS — J439 Emphysema, unspecified: Secondary | ICD-10-CM | POA: Diagnosis present

## 2021-07-20 DIAGNOSIS — N189 Chronic kidney disease, unspecified: Secondary | ICD-10-CM

## 2021-07-20 DIAGNOSIS — Z20822 Contact with and (suspected) exposure to covid-19: Secondary | ICD-10-CM | POA: Diagnosis present

## 2021-07-20 DIAGNOSIS — E1165 Type 2 diabetes mellitus with hyperglycemia: Secondary | ICD-10-CM | POA: Diagnosis present

## 2021-07-20 DIAGNOSIS — I25810 Atherosclerosis of coronary artery bypass graft(s) without angina pectoris: Secondary | ICD-10-CM | POA: Diagnosis not present

## 2021-07-20 DIAGNOSIS — N179 Acute kidney failure, unspecified: Secondary | ICD-10-CM | POA: Diagnosis present

## 2021-07-20 DIAGNOSIS — I10 Essential (primary) hypertension: Secondary | ICD-10-CM | POA: Diagnosis not present

## 2021-07-20 DIAGNOSIS — Z6839 Body mass index (BMI) 39.0-39.9, adult: Secondary | ICD-10-CM | POA: Diagnosis not present

## 2021-07-20 DIAGNOSIS — E1122 Type 2 diabetes mellitus with diabetic chronic kidney disease: Secondary | ICD-10-CM | POA: Diagnosis present

## 2021-07-20 DIAGNOSIS — Z91048 Other nonmedicinal substance allergy status: Secondary | ICD-10-CM | POA: Diagnosis not present

## 2021-07-20 DIAGNOSIS — Z7902 Long term (current) use of antithrombotics/antiplatelets: Secondary | ICD-10-CM

## 2021-07-20 DIAGNOSIS — Z886 Allergy status to analgesic agent status: Secondary | ICD-10-CM | POA: Diagnosis not present

## 2021-07-20 DIAGNOSIS — Z885 Allergy status to narcotic agent status: Secondary | ICD-10-CM

## 2021-07-20 DIAGNOSIS — I25110 Atherosclerotic heart disease of native coronary artery with unstable angina pectoris: Secondary | ICD-10-CM | POA: Diagnosis present

## 2021-07-20 DIAGNOSIS — I25119 Atherosclerotic heart disease of native coronary artery with unspecified angina pectoris: Secondary | ICD-10-CM | POA: Diagnosis not present

## 2021-07-20 DIAGNOSIS — Z83438 Family history of other disorder of lipoprotein metabolism and other lipidemia: Secondary | ICD-10-CM

## 2021-07-20 DIAGNOSIS — I130 Hypertensive heart and chronic kidney disease with heart failure and stage 1 through stage 4 chronic kidney disease, or unspecified chronic kidney disease: Secondary | ICD-10-CM | POA: Diagnosis present

## 2021-07-20 DIAGNOSIS — Z79899 Other long term (current) drug therapy: Secondary | ICD-10-CM | POA: Diagnosis not present

## 2021-07-20 DIAGNOSIS — Z888 Allergy status to other drugs, medicaments and biological substances status: Secondary | ICD-10-CM

## 2021-07-20 DIAGNOSIS — E785 Hyperlipidemia, unspecified: Secondary | ICD-10-CM | POA: Diagnosis present

## 2021-07-20 DIAGNOSIS — Z7984 Long term (current) use of oral hypoglycemic drugs: Secondary | ICD-10-CM

## 2021-07-20 DIAGNOSIS — I252 Old myocardial infarction: Secondary | ICD-10-CM | POA: Diagnosis not present

## 2021-07-20 DIAGNOSIS — Z87891 Personal history of nicotine dependence: Secondary | ICD-10-CM

## 2021-07-20 DIAGNOSIS — K219 Gastro-esophageal reflux disease without esophagitis: Secondary | ICD-10-CM | POA: Diagnosis present

## 2021-07-20 LAB — CBC WITH DIFFERENTIAL/PLATELET
Abs Immature Granulocytes: 0.04 10*3/uL (ref 0.00–0.07)
Basophils Absolute: 0 10*3/uL (ref 0.0–0.1)
Basophils Relative: 0 %
Eosinophils Absolute: 0.1 10*3/uL (ref 0.0–0.5)
Eosinophils Relative: 1 %
HCT: 34.8 % — ABNORMAL LOW (ref 36.0–46.0)
Hemoglobin: 11.2 g/dL — ABNORMAL LOW (ref 12.0–15.0)
Immature Granulocytes: 1 %
Lymphocytes Relative: 18 %
Lymphs Abs: 1.3 10*3/uL (ref 0.7–4.0)
MCH: 28.9 pg (ref 26.0–34.0)
MCHC: 32.2 g/dL (ref 30.0–36.0)
MCV: 89.7 fL (ref 80.0–100.0)
Monocytes Absolute: 0.6 10*3/uL (ref 0.1–1.0)
Monocytes Relative: 9 %
Neutro Abs: 5 10*3/uL (ref 1.7–7.7)
Neutrophils Relative %: 71 %
Platelets: 260 10*3/uL (ref 150–400)
RBC: 3.88 MIL/uL (ref 3.87–5.11)
RDW: 13.5 % (ref 11.5–15.5)
WBC: 7 10*3/uL (ref 4.0–10.5)
nRBC: 0 % (ref 0.0–0.2)

## 2021-07-20 LAB — LIPID PANEL
Cholesterol: 148 mg/dL (ref 0–200)
HDL: 55 mg/dL (ref >40)
LDL Cholesterol: 56 mg/dL (ref 0–99)
Total CHOL/HDL Ratio: 2.7 RATIO
Triglycerides: 186 mg/dL — ABNORMAL HIGH (ref <150)
VLDL: 37 mg/dL (ref 0–40)

## 2021-07-20 LAB — URINALYSIS, ROUTINE W REFLEX MICROSCOPIC
Bilirubin Urine: NEGATIVE
Glucose, UA: >=500 mg/dL — AB
Hgb urine dipstick: NEGATIVE
Ketones, ur: NEGATIVE mg/dL
Nitrite: NEGATIVE
Protein, ur: 30 mg/dL — AB
Specific Gravity, Urine: 1.014 (ref 1.005–1.030)
pH: 6 (ref 5.0–8.0)

## 2021-07-20 LAB — COMPREHENSIVE METABOLIC PANEL
ALT: 34 U/L (ref 0–44)
AST: 39 U/L (ref 15–41)
Albumin: 3.7 g/dL (ref 3.5–5.0)
Alkaline Phosphatase: 40 U/L (ref 38–126)
Anion gap: 10 (ref 5–15)
BUN: 19 mg/dL (ref 6–20)
CO2: 25 mmol/L (ref 22–32)
Calcium: 10.4 mg/dL — ABNORMAL HIGH (ref 8.9–10.3)
Chloride: 101 mmol/L (ref 98–111)
Creatinine, Ser: 1.48 mg/dL — ABNORMAL HIGH (ref 0.44–1.00)
GFR, Estimated: 40 mL/min — ABNORMAL LOW (ref >60)
Glucose, Bld: 489 mg/dL — ABNORMAL HIGH (ref 70–99)
Potassium: 5.1 mmol/L (ref 3.5–5.1)
Sodium: 136 mmol/L (ref 135–145)
Total Bilirubin: 0.6 mg/dL (ref 0.3–1.2)
Total Protein: 7.3 g/dL (ref 6.5–8.1)

## 2021-07-20 LAB — GLUCOSE, CAPILLARY: Glucose-Capillary: 315 mg/dL — ABNORMAL HIGH (ref 70–99)

## 2021-07-20 LAB — RESP PANEL BY RT-PCR (FLU A&B, COVID) ARPGX2
Influenza A by PCR: NEGATIVE
Influenza B by PCR: NEGATIVE
SARS Coronavirus 2 by RT PCR: NEGATIVE

## 2021-07-20 LAB — CBG MONITORING, ED: Glucose-Capillary: 344 mg/dL — ABNORMAL HIGH (ref 70–99)

## 2021-07-20 LAB — HEPARIN LEVEL (UNFRACTIONATED)
Heparin Unfractionated: <0.1 IU/mL — ABNORMAL LOW (ref 0.30–0.70)
Heparin Unfractionated: 0.27 IU/mL — ABNORMAL LOW (ref 0.30–0.70)

## 2021-07-20 LAB — TROPONIN I (HIGH SENSITIVITY)
Troponin I (High Sensitivity): 9 ng/L (ref <18)
Troponin I (High Sensitivity): 18 ng/L — ABNORMAL HIGH (ref <18)
Troponin I (High Sensitivity): 33 ng/L — ABNORMAL HIGH (ref <18)
Troponin I (High Sensitivity): 27 ng/L — ABNORMAL HIGH (ref <18)

## 2021-07-20 LAB — HEMOGLOBIN A1C
Hgb A1c MFr Bld: 12 % — ABNORMAL HIGH (ref 4.8–5.6)
Mean Plasma Glucose: 297.7 mg/dL

## 2021-07-20 LAB — PROTIME-INR
INR: 1.1 (ref 0.8–1.2)
Prothrombin Time: 14.1 seconds (ref 11.4–15.2)

## 2021-07-20 LAB — BRAIN NATRIURETIC PEPTIDE: B Natriuretic Peptide: 86.1 pg/mL (ref 0.0–100.0)

## 2021-07-20 MED ORDER — CLOPIDOGREL BISULFATE 75 MG PO TABS
75.0000 mg | ORAL_TABLET | Freq: Every day | ORAL | Status: DC
  Administered 2021-07-20 – 2021-07-22 (×3): 75 mg via ORAL
  Filled 2021-07-20 (×3): qty 1

## 2021-07-20 MED ORDER — INSULIN ASPART 100 UNIT/ML IJ SOLN
0.0000 [IU] | Freq: Three times a day (TID) | INTRAMUSCULAR | Status: DC
  Administered 2021-07-20: 15 [IU] via SUBCUTANEOUS
  Administered 2021-07-21: 7 [IU] via SUBCUTANEOUS
  Administered 2021-07-21 – 2021-07-22 (×2): 11 [IU] via SUBCUTANEOUS

## 2021-07-20 MED ORDER — SODIUM CHLORIDE 0.9 % IV SOLN
250.0000 mL | INTRAVENOUS | Status: DC | PRN
Start: 2021-07-20 — End: 2021-07-21

## 2021-07-20 MED ORDER — NITROGLYCERIN 0.4 MG SL SUBL: 0.4000 mg | SUBLINGUAL_TABLET | SUBLINGUAL | Status: DC | PRN

## 2021-07-20 MED ORDER — PANTOPRAZOLE SODIUM 40 MG PO TBEC
40.0000 mg | DELAYED_RELEASE_TABLET | Freq: Every day | ORAL | Status: DC
  Administered 2021-07-20 – 2021-07-22 (×3): 40 mg via ORAL
  Filled 2021-07-20 (×3): qty 1

## 2021-07-20 MED ORDER — ACETAMINOPHEN 650 MG RE SUPP: 650.0000 mg | Freq: Four times a day (QID) | RECTAL | Status: DC | PRN

## 2021-07-20 MED ORDER — AMIODARONE HCL 200 MG PO TABS
200.0000 mg | ORAL_TABLET | Freq: Every day | ORAL | Status: DC
  Administered 2021-07-20 – 2021-07-22 (×3): 200 mg via ORAL
  Filled 2021-07-20 (×3): qty 1

## 2021-07-20 MED ORDER — ASPIRIN 81 MG PO CHEW
81.0000 mg | CHEWABLE_TABLET | ORAL | Status: AC
  Administered 2021-07-21: 81 mg via ORAL
  Filled 2021-07-20: qty 1

## 2021-07-20 MED ORDER — SODIUM CHLORIDE 0.9 % WEIGHT BASED INFUSION
3.0000 mL/kg/h | INTRAVENOUS | Status: DC
  Administered 2021-07-21: 3 mL/kg/h via INTRAVENOUS

## 2021-07-20 MED ORDER — SODIUM CHLORIDE 0.9 % WEIGHT BASED INFUSION
1.0000 mL/kg/h | INTRAVENOUS | Status: DC
  Administered 2021-07-21: 1 mL/kg/h via INTRAVENOUS

## 2021-07-20 MED ORDER — SODIUM CHLORIDE 0.9% FLUSH
3.0000 mL | Freq: Two times a day (BID) | INTRAVENOUS | Status: DC
  Administered 2021-07-21: 3 mL via INTRAVENOUS

## 2021-07-20 MED ORDER — HEPARIN BOLUS VIA INFUSION
4000.0000 [IU] | Freq: Once | INTRAVENOUS | Status: AC
  Administered 2021-07-20: 4000 [IU] via INTRAVENOUS
  Filled 2021-07-20: qty 4000

## 2021-07-20 MED ORDER — ASPIRIN EC 81 MG PO TBEC
81.0000 mg | DELAYED_RELEASE_TABLET | Freq: Every day | ORAL | Status: DC
  Administered 2021-07-22: 81 mg via ORAL
  Filled 2021-07-20: qty 1

## 2021-07-20 MED ORDER — METOPROLOL SUCCINATE ER 50 MG PO TB24
50.0000 mg | ORAL_TABLET | Freq: Three times a day (TID) | ORAL | Status: DC
  Administered 2021-07-20 – 2021-07-21 (×4): 50 mg via ORAL
  Filled 2021-07-20 (×2): qty 1
  Filled 2021-07-20: qty 2
  Filled 2021-07-20 (×3): qty 1

## 2021-07-20 MED ORDER — ROSUVASTATIN CALCIUM 20 MG PO TABS
20.0000 mg | ORAL_TABLET | Freq: Every day | ORAL | Status: DC
  Administered 2021-07-20 – 2021-07-21 (×2): 20 mg via ORAL
  Filled 2021-07-20 (×2): qty 1

## 2021-07-20 MED ORDER — HEPARIN (PORCINE) 25000 UT/250ML-% IV SOLN
1600.0000 [IU]/h | INTRAVENOUS | Status: DC
  Administered 2021-07-20: 1150 [IU]/h via INTRAVENOUS
  Administered 2021-07-21: 1600 [IU]/h via INTRAVENOUS
  Filled 2021-07-20: qty 250

## 2021-07-20 MED ORDER — ASPIRIN EC 81 MG PO TBEC
81.0000 mg | DELAYED_RELEASE_TABLET | Freq: Every day | ORAL | Status: DC
  Administered 2021-07-20: 81 mg via ORAL
  Filled 2021-07-20: qty 1

## 2021-07-20 MED ORDER — SODIUM CHLORIDE 0.9% FLUSH: 3.0000 mL | INTRAVENOUS | Status: DC | PRN

## 2021-07-20 MED ORDER — ACETAMINOPHEN 325 MG PO TABS
650.0000 mg | ORAL_TABLET | Freq: Four times a day (QID) | ORAL | Status: DC | PRN
  Administered 2021-07-21: 650 mg via ORAL
  Filled 2021-07-20: qty 2

## 2021-07-20 MED ORDER — INSULIN GLARGINE-YFGN 100 UNIT/ML ~~LOC~~ SOLN
15.0000 [IU] | Freq: Every day | SUBCUTANEOUS | Status: DC
  Administered 2021-07-20: 15 [IU] via SUBCUTANEOUS
  Filled 2021-07-20 (×2): qty 0.15

## 2021-07-20 MED ORDER — SODIUM CHLORIDE 0.9% FLUSH
3.0000 mL | Freq: Two times a day (BID) | INTRAVENOUS | Status: DC
  Administered 2021-07-20 – 2021-07-21 (×3): 3 mL via INTRAVENOUS

## 2021-07-20 MED ORDER — ISOSORBIDE MONONITRATE ER 30 MG PO TB24
30.0000 mg | ORAL_TABLET | Freq: Every day | ORAL | Status: DC
  Administered 2021-07-20 – 2021-07-22 (×3): 30 mg via ORAL
  Filled 2021-07-20 (×3): qty 1

## 2021-07-20 NOTE — Progress Notes (Signed)
Dr. Oval Linsey recommends cath tomorrow and has written orders. I called cath lab to put on for LCP with grafts tomorrow. This is scheduled 12pm with Dr. Ellyn Hack tomorrow.

## 2021-07-20 NOTE — Progress Notes (Signed)
ANTICOAGULATION CONSULT NOTE - Initial Consult  Pharmacy Consult for heparin Indication: chest pain/ACS  Allergies  Allergen Reactions   Chlorhexidine Gluconate Itching    Received CHG bath, began itching, required benadryl   Cat Hair Extract Other (See Comments)    Sneezing, watery eyes.   Tramadol Other (See Comments)    Hallucinations   Codeine Rash    Patient Measurements: Height: '5\' 7"'$  (170.2 cm) Weight: 112.5 kg (248 lb 0.3 oz) IBW/kg (Calculated) : 61.6 Heparin Dosing Weight: 87.7 kg  Vital Signs: Temp: 98.1 F (36.7 C) (08/04 1316) Temp Source: Oral (08/04 1316) BP: 136/78 (08/04 1316) Pulse Rate: 90 (08/04 1415)  Labs: Recent Labs    07/20/21 0104 07/20/21 0327 07/20/21 1232  HGB 11.2*  --   --   HCT 34.8*  --   --   PLT 260  --   --   CREATININE 1.48*  --   --   TROPONINIHS 9 18* 33*    Estimated Creatinine Clearance: 52.3 mL/min (A) (by C-G formula based on SCr of 1.48 mg/dL (H)).   Medical History: Past Medical History:  Diagnosis Date   Acute chest pain 05/08/2016   Angina pectoris ([place]) 09/11/2017   Burping 05/08/2016   Chronic diastolic heart failure ([place]) 11/06/2016   Coronary artery disease involving native coronary artery of native heart with angina pectoris ([place]) 09/12/2015   Overview:  PCI and stent of RCA 2009, last cath 2012 with medical therapy  CABG May 2017   Emphysema lung ([place]) 09/11/2017   Essential hypertension 09/12/2015   GERD (gastroesophageal reflux disease) 05/08/2016   Hyperlipidemia 09/12/2015   Mediastinitis 06/28/2016   Morbid obesity ([place]) 05/08/2016   S/P CABG (coronary artery bypass graft) 06/03/2016   Overview:  The patient underwent sternal reconstruction on 06/21/16 with pec flaps for mediastinitis from a prior CABG in May 2017. On admission, she was critically ill from sepsis and had altered mental status. She was last seen in clinic on 08/09/16 at which time she was doing well.   Severe sepsis ([place]) 06/03/2016   Sinus  tachycardia 05/08/2016   Tobacco use disorder 04/20/2016   Overview:  Quit in May 2017.   Uncontrolled type 2 diabetes mellitus ([place]) 05/08/2016   Wound, surgical, infected 06/06/2016   Overview:  sternal   Assessment: 60 yo F with CAD (s/p stent to RCA in 2009, s/p CABG in 2017), in-stent stenosis, HTN, HFpEF, uncontrolled T2DM, CKD stage 3a, and HLD. Started having chest pain last night. Heparin consult for ACS.  Goal of Therapy:  Heparin level 0.3-0.7 units/ml Monitor platelets by anticoagulation protocol: Yes   Plan:  Give 4000 units bolus x 1 Start heparin infusion at 1150 units/hr Check anti-Xa level in 6 hours and daily while on heparin Continue to monitor H&H and platelets  Joetta Manners, PharmD, [facility] Emergency Medicine Clinical Pharmacist ED RPh Phone: [place]: [phone]

## 2021-07-20 NOTE — ED Triage Notes (Signed)
Pt reports that she began to have CP tonight, took one nitro and it helped with pain, pain then shortly returned, tingling to L arm, some dizziness,denies n/v or SOB

## 2021-07-20 NOTE — ED Provider Notes (Signed)
[facility] EMERGENCY DEPARTMENT Provider Note   CSN: [id number] Arrival date & time: 07/20/21  0042     History Chief Complaint  Patient presents with   Chest Pain    Jasmine Richardson is a 60 y.o. female.   Chest Pain Associated symptoms: no abdominal pain, no back pain, no cough, no fever, no palpitations, no shortness of breath and no vomiting    60 year old female with multivessel CAD status post CABG and PCI on DAPT, ventricular tachycardia status post ICD placement presenting to the emergency department with chest pain.  Patient reports that she was walking around her room last night getting ready for bed when she experienced to the onset of central, substernal chest pain.  It was aching in nature, she states it is like someone sitting on her chest.  She took 1 sublingual nitroglycerin and her chest pain resolved and has not recurred since.  She denies any recent fever or cough.  She denies any history of PE or DVT.  She denies any recent leg swelling, travel, surgeries.  She states that it was similar to her previous cardiac chest pain and therefore wished to be evaluated.  Past Medical History:  Diagnosis Date   Acute chest pain 05/08/2016   Angina pectoris ([place]) 09/11/2017   Burping 05/08/2016   Chronic diastolic heart failure ([place]) 11/06/2016   Coronary artery disease involving native coronary artery of native heart with angina pectoris ([place]) 09/12/2015   Overview:  PCI and stent of RCA 2009, last cath 2012 with medical therapy  CABG May 2017   Emphysema lung ([place]) 09/11/2017   Essential hypertension 09/12/2015   GERD (gastroesophageal reflux disease) 05/08/2016   Hyperlipidemia 09/12/2015   Mediastinitis 06/28/2016   Morbid obesity ([place]) 05/08/2016   S/P CABG (coronary artery bypass graft) 06/03/2016   Overview:  The patient underwent sternal reconstruction on 06/21/16 with pec flaps for mediastinitis from a prior CABG in May 2017. On admission, she was critically ill  from sepsis and had altered mental status. She was last seen in clinic on 08/09/16 at which time she was doing well.   Severe sepsis ([place]) 06/03/2016   Sinus tachycardia 05/08/2016   Tobacco use disorder 04/20/2016   Overview:  Quit in May 2017.   Uncontrolled type 2 diabetes mellitus ([place]) 05/08/2016   Wound, surgical, infected 06/06/2016   Overview:  sternal    Patient Active Problem List   Diagnosis Date Noted   ICD (implantable cardioverter-defibrillator) in place 05/16/2021   Ventricular tachycardia ([place]) 12/17/2020   Unstable angina ([place]) 05/22/2020   NSTEMI (non-ST elevated myocardial infarction) ([place]) 05/22/2020   Diabetic foot ulcer ([place]) 02/16/2020   Angina pectoris ([place]) 09/11/2017   Emphysema lung ([place]) 09/11/2017   Chronic diastolic heart failure ([place]) 11/06/2016   Mediastinitis 06/28/2016   Wound, surgical, infected 06/06/2016   S/P CABG (coronary artery bypass graft) 06/03/2016   Severe sepsis ([place]) 06/03/2016   GERD (gastroesophageal reflux disease) 05/08/2016   Morbid obesity ([place]) 05/08/2016   Uncontrolled type 2 diabetes mellitus ([place]) 05/08/2016   Sinus tachycardia 05/08/2016   Acute chest pain 05/08/2016   Burping 05/08/2016   Tobacco use disorder 04/20/2016   Coronary artery disease involving native coronary artery of native heart with angina pectoris ([place]) 09/12/2015   Essential hypertension 09/12/2015   Hyperlipidemia 09/12/2015    Past Surgical History:  Procedure Laterality Date   AMPUTATION TOE Right 02/20/2020   Procedure: AMPUTATION RIGHT GREAT  TOE;  Surgeon: Felipa Furnace, DPM;  Location: [facility];  Service: Podiatry;  Laterality: Right;   BLADDER SURGERY     CARDIAC CATHETERIZATION     CORONARY ARTERY BYPASS GRAFT     CORONARY STENT INTERVENTION N/A 05/25/2020   Procedure: CORONARY STENT INTERVENTION;  Surgeon: Wellington Hampshire, MD;  Location: [facility];  Service: Cardiovascular;  Laterality: N/A;   CORONARY STENT INTERVENTION N/A 06/22/2020    Procedure: CORONARY STENT INTERVENTION;  Surgeon: Leonie Man, MD;  Location: [facility];  Service: Cardiovascular;  Laterality: N/A;   ICD IMPLANT N/A 12/19/2020   Procedure: ICD IMPLANT;  Surgeon: Evans Lance, MD;  Location: [facility];  Service: Cardiovascular;  Laterality: N/A;   INCISION AND DRAINAGE Right 02/19/2020   Procedure: INCISION AND DRAINAGE;  Surgeon: Trula Slade, DPM;  Location: [facility];  Service: Podiatry;  Laterality: Right;  Block done by surgeon   INTRAVASCULAR ULTRASOUND/IVUS N/A 06/22/2020   Procedure: Intravascular Ultrasound/IVUS;  Surgeon: Leonie Man, MD;  Location: [facility];  Service: Cardiovascular;  Laterality: N/A;   LEFT HEART CATH AND CORS/GRAFTS ANGIOGRAPHY N/A 05/23/2020   Procedure: LEFT HEART CATH AND CORS/GRAFTS ANGIOGRAPHY;  Surgeon: Nelva Bush, MD;  Location: [facility];  Service: Cardiovascular;  Laterality: N/A;   LEFT HEART CATH AND CORS/GRAFTS ANGIOGRAPHY N/A 12/19/2020   Procedure: LEFT HEART CATH AND CORS/GRAFTS ANGIOGRAPHY;  Surgeon: Burnell Blanks, MD;  Location: [facility];  Service: Cardiovascular;  Laterality: N/A;   METATARSAL HEAD EXCISION Right 05/02/2020   Procedure: FIRST METATARSAL HEAD RESECTION; RIGHT FOOT WOUND CLOSURE;  Surgeon: Landis Martins, DPM;  Location: [facility];  Service: Podiatry;  Laterality: Right;  MAC W/LOCAL   TUBAL LIGATION       OB History   No obstetric history on file.     Family History  Problem Relation Age of Onset   Hypertension Mother    Hyperlipidemia Mother    Heart attack Father    Heart disease Father    Hypertension Father    Alzheimer's disease Father    Hypertension Brother    Hyperlipidemia Brother     Social History   Tobacco Use   Smoking status: Former    Types: Cigarettes    Quit date: 05/2016    Years since quitting: 5.1   Smokeless tobacco: Never  Vaping Use   Vaping Use: Never used  Substance  Use Topics   Alcohol use: No   Drug use: No    Home Medications Prior to Admission medications   Medication Sig Start Date End Date Taking? Authorizing Provider  amiodarone (PACERONE) 200 MG tablet Take 1 tablet (200 mg total) by mouth daily. 02/14/21   Richardo Priest, MD  aspirin EC 81 MG tablet Take 1 tablet (81 mg total) by mouth daily. 05/13/20   Richardo Priest, MD  clopidogrel (PLAVIX) 75 MG tablet Take 1 tablet by mouth once daily 04/05/21   Richardo Priest, MD  Cyanocobalamin (VITAMIN B 12 PO) Take 1 tablet by mouth at bedtime.    [provider]  Evolocumab (REPATHA SURECLICK) 678 MG/ML SOAJ Inject 140 mg into the skin every 14 (fourteen) days. 05/16/21   Richardo Priest, MD  fenofibrate (TRICOR) 145 MG tablet Take 1 tablet (145 mg total) by mouth daily. 02/14/21   Richardo Priest, MD  furosemide (LASIX) 40 MG tablet Take 1 tablet by mouth once daily 04/05/21   Richardo Priest, MD  glipiZIDE (GLUCOTROL XL) 10 MG 24 hr tablet Take 10 mg by mouth 2 (two) times daily. 12/06/20   [provider]  LORazepam (ATIVAN) 1 MG tablet Take 1 mg by mouth daily as needed for anxiety.    [provider]  metFORMIN (GLUCOPHAGE-XR) 500 MG 24 hr tablet Take 1,000 mg by mouth 2 (two) times daily. 02/15/20   [provider]  metoprolol succinate (TOPROL-XL) 50 MG 24 hr tablet Take 1 tablet (50 mg total) by mouth 3 (three) times daily. 02/14/21   Richardo Priest, MD  nitroGLYCERIN (NITROSTAT) 0.4 MG SL tablet DISSOLVE ONE TABLET UNDER THE TONGUE EVERY 5 MINUTES AS NEEDED FOR CHEST PAIN.  DO NOT EXCEED A TOTAL OF 3 DOSES IN 15 MINUTES 01/23/21   Richardo Priest, MD  nystatin cream (MYCOSTATIN) Apply 1 application topically 2 (two) times daily as needed for dry skin (rash). 10/31/20   [provider]  omeprazole (PRILOSEC) 20 MG capsule Take 1 capsule by mouth daily. 05/07/21   [provider]  rosuvastatin (CRESTOR) 20 MG tablet Take 20 mg by mouth at bedtime. 05/04/21    [provider]  TRESIBA FLEXTOUCH 100 UNIT/ML FlexTouch Pen Inject 30 Units into the skin daily. 04/25/21   [provider]  valsartan (DIOVAN) 80 MG tablet Take 1 tablet (80 mg total) by mouth 2 (two) times daily. 01/11/21   Richardo Priest, MD  vitamin C (ASCORBIC ACID) 250 MG tablet Take 250 mg by mouth at bedtime.    [provider]  Vitamin D, Ergocalciferol, (DRISDOL) 1.25 MG (50000 UNIT) CAPS capsule Take 1 capsule by mouth once a week. 04/12/21   [provider]    Allergies    Chlorhexidine gluconate, Tramadol, and Codeine  Review of Systems   Review of Systems  Constitutional:  Negative for chills and fever.  HENT:  Negative for ear pain and sore throat.   Eyes:  Negative for pain and visual disturbance.  Respiratory:  Negative for cough and shortness of breath.   Cardiovascular:  Positive for chest pain. Negative for palpitations.  Gastrointestinal:  Negative for abdominal pain and vomiting.  Genitourinary:  Negative for dysuria and hematuria.  Musculoskeletal:  Negative for arthralgias and back pain.  Skin:  Negative for color change and rash.  Neurological:  Negative for seizures and syncope.  All other systems reviewed and are negative.  Physical Exam Updated Vital Signs BP (!) 151/82   Pulse 80   Temp 97.9 F (36.6 C)   Resp 18   LMP  (LMP Unknown)   SpO2 99%   Physical Exam Vitals and nursing note reviewed.  Constitutional:      General: She is not in acute distress.    Appearance: She is well-developed. She is obese. She is not ill-appearing or toxic-appearing.  HENT:     Head: Normocephalic and atraumatic.  Eyes:     Conjunctiva/sclera: Conjunctivae normal.  Cardiovascular:     Rate and Rhythm: Normal rate and regular rhythm.     Heart sounds: No murmur heard. Pulmonary:     Effort: Pulmonary effort is normal. No respiratory distress.     Breath sounds: Normal breath sounds.  Abdominal:     Palpations: Abdomen is  soft.     Tenderness: There is no abdominal tenderness.  Musculoskeletal:     Cervical back: Neck supple.     Right lower leg: No edema.     Left lower leg: No edema.  Skin:  General: Skin is warm and dry.  Neurological:     Mental Status: She is alert.    ED Results / Procedures / Treatments   Labs (all labs ordered are listed, but only abnormal results are displayed) Labs Reviewed  COMPREHENSIVE METABOLIC PANEL - Abnormal; Notable for the following components:      Result Value   Glucose, Bld 489 (*)    Creatinine, Ser 1.48 (*)    Calcium 10.4 (*)    GFR, Estimated 40 (*)    All other components within normal limits  CBC WITH DIFFERENTIAL/PLATELET - Abnormal; Notable for the following components:   Hemoglobin 11.2 (*)    HCT 34.8 (*)    All other components within normal limits  TROPONIN I (HIGH SENSITIVITY) - Abnormal; Notable for the following components:   Troponin I (High Sensitivity) 18 (*)    All other components within normal limits  BRAIN NATRIURETIC PEPTIDE  TROPONIN I (HIGH SENSITIVITY)    EKG EKG Interpretation  Date/Time:  Thursday July 20 2021 00:48:41 EDT Ventricular Rate:  85 PR Interval:  196 QRS Duration: 128 QT Interval:  456 QTC Calculation: 542 R Axis:   141 Text Interpretation: Normal sinus rhythm Non-specific intra-ventricular conduction block Cannot rule out Inferior infarct , age undetermined Cannot rule out Anteroseptal infarct , age undetermined Abnormal ECG Confirmed by Elnora Morrison [phone]) on 07/20/2021 7:53:48 AM  Radiology DG Chest 2 View  Result Date: 07/20/2021 CLINICAL DATA:  Chest pain. EXAM: CHEST - 2 VIEW COMPARISON:  Chest radiograph dated 05/07/2021. FINDINGS: Shallow inspiration. No focal consolidation, pleural effusion or pneumothorax. The cardiac silhouette is within limits. Left pectoral AICD device. Degenerative changes of the spine. No acute osseous pathology. IMPRESSION: No active cardiopulmonary disease.  Electronically Signed   By: Anner Crete M.D.   On: 07/20/2021 01:43    Procedures Procedures   Medications Ordered in ED Medications - No data to display  ED Course  I have reviewed the triage vital signs and the nursing notes.  Pertinent labs & imaging results that were available during my care of the patient were reviewed by me and considered in my medical decision making (see chart for details).    MDM Rules/Calculators/A&P HEAR Score: 79                         60 year old female with known CAD status post CABG and multiple PCI presenting to the emergency department with typical chest pain.  Her pain occurred last night with minimal exertion and resolved with nitroglycerin.  Vital signs reviewed, within acceptable limits.  Physical exam is notable for well-appearing female.  No significant lower extremity edema.  Patient's history is not suggestive of pulmonary embolism, her pain is not sharp, she is not tachycardic or hypoxic.  Her pain did not radiate to her back and she is currently pain-free, doubt aortic dissection.  Chest x-ray negative for pneumothorax or pneumonia.  Presentation most concerning for ACS, given her significant known vessel disease.  EKG reviewed.  She has an interventricular block that appears similar to previous, as well as some depressions of the ST segments in 1 and aVL that are also present on prior ECGs.  Initial troponin of 9, doubled to 18 on trending this value.  Given her typical angina and known vessel disease that is being managed medically, with an uptrending troponin, will consult cardiology here in the emergency department.  Spoke with cardiology.  They recommend a medicine admission  and they will follow along.  We will also repeat a troponin here in the ED.  She remains chest pain-free while at rest.  Handoff given to the medicine team.  Final Clinical Impression(s) / ED Diagnoses Final diagnoses:  Chest pain, unspecified type    Rx / DC  Orders ED Discharge Orders     None        Claud Kelp, MD 07/20/21 1255    Elnora Morrison, MD 07/20/21 1559

## 2021-07-20 NOTE — Consult Note (Addendum)
Cardiology Consultation:   [name] MRN: [id number]; DOB: May 11, 1961  Admit date: 07/20/2021 Date of Consult: 07/20/2021  PCP:  Nicholos Johns, MD   [facility] Providers Cardiologist:  Shirlee More, MD        Patient Profile:   Jasmine Richardson is a 60 y.o. female with a hx of coronary artery disease with stenting to RCA in 2009, s/p CABG (LIMA-LAD, SVG-OM1, SVG-Diag, SVG-PDA; 5284) complicated by mediastinitis requiring reoperation, in-stent restenosis (PCI + DES) of RCA (05/2020), s/p lithotripsy and DES of left main (06/2020), history of ventricular tachycardia s/p ICD (12/2020), hypertension, HFpEF, uncontrolled type 2 diabetes mellitus, CKDIIIa, hyperlipidemia and who is being seen 07/20/2021 for the evaluation of chest pain at the request of Dr. Florene Glen.  History of Present Illness:   Ms. Jasmine Richardson was in her usual state of health until she experienced pressure-like chest pains last night. Describes mid-sternal chest pain that began after dinner and subsided after a few minutes. Later in the evening, the chest pain returned. Both episodes occurred while the patient was at rest. At the time she believed this was 2/2 GERD and took two Rollaids. This did not improve her symptoms, so she took a sublingual nitroglycerin. She reports that this completely resolved her pain. She mentions that she has not had similar chest pains in "months" and has not used nitroglycerin in at least one year. She currently denies chest pain the ED and has not experienced any since last night.  She reports that she sees Dr. [name] regularly. During her last visit 05/24/21, no changes in regimen were made. She has a significant past cardiac history, including coronary artery disease s/p bypass grafts with subsequent graft stenosis causing PCI x2 to RCA and left main in 2021. She reports compliance with her dual antiplatelet therapy. She has multiple risk factors for further events, including uncontrolled type 2  diabetes mellitus, hyperlipidemia, hypertension, obesity, 40 pack-year history (not current smoker).  In the Emergency Department, initial troponin negative. ECG with normal sinus rhythm, chronic right bundle branch block and right axis deviation. HEART score 6. IMTS was called for admission and cardiology to consult.  Past Medical History:  Diagnosis Date   Acute chest pain 05/08/2016   Angina pectoris ([place]) 09/11/2017   Burping 05/08/2016   Chronic diastolic heart failure ([place]) 11/06/2016   Coronary artery disease involving native coronary artery of native heart with angina pectoris ([place]) 09/12/2015   Overview:  PCI and stent of RCA 2009, last cath 2012 with medical therapy  CABG May 2017   Emphysema lung ([place]) 09/11/2017   Essential hypertension 09/12/2015   GERD (gastroesophageal reflux disease) 05/08/2016   Hyperlipidemia 09/12/2015   Mediastinitis 06/28/2016   Morbid obesity ([place]) 05/08/2016   S/P CABG (coronary artery bypass graft) 06/03/2016   Overview:  The patient underwent sternal reconstruction on 06/21/16 with pec flaps for mediastinitis from a prior CABG in May 2017. On admission, she was critically ill from sepsis and had altered mental status. She was last seen in clinic on 08/09/16 at which time she was doing well.   Severe sepsis ([place]) 06/03/2016   Sinus tachycardia 05/08/2016   Tobacco use disorder 04/20/2016   Overview:  Quit in May 2017.   Uncontrolled type 2 diabetes mellitus ([place]) 05/08/2016   Wound, surgical, infected 06/06/2016   Overview:  sternal    Past Surgical History:  Procedure Laterality Date   AMPUTATION TOE Right 02/20/2020   Procedure: AMPUTATION RIGHT GREAT  TOE;  Surgeon: Posey Pronto,  Thomasene Lot, DPM;  Location: [place];  Service: Podiatry;  Laterality: Right;   BLADDER SURGERY     CARDIAC CATHETERIZATION     CORONARY ARTERY BYPASS GRAFT     CORONARY STENT INTERVENTION N/A 05/25/2020   Procedure: CORONARY STENT INTERVENTION;  Surgeon: Wellington Hampshire, MD;  Location: [place] [facility];  Service: Cardiovascular;  Laterality: N/A;   CORONARY STENT INTERVENTION N/A 06/22/2020   Procedure: CORONARY STENT INTERVENTION;  Surgeon: Leonie Man, MD;  Location: [place] [facility];  Service: Cardiovascular;  Laterality: N/A;   ICD IMPLANT N/A 12/19/2020   Procedure: ICD IMPLANT;  Surgeon: Evans Lance, MD;  Location: [place] [facility];  Service: Cardiovascular;  Laterality: N/A;   INCISION AND DRAINAGE Right 02/19/2020   Procedure: INCISION AND DRAINAGE;  Surgeon: Trula Slade, DPM;  Location: [place];  Service: Podiatry;  Laterality: Right;  Block done by surgeon   INTRAVASCULAR ULTRASOUND/IVUS N/A 06/22/2020   Procedure: Intravascular Ultrasound/IVUS;  Surgeon: Leonie Man, MD;  Location: [place] [facility];  Service: Cardiovascular;  Laterality: N/A;   LEFT HEART CATH AND CORS/GRAFTS ANGIOGRAPHY N/A 05/23/2020   Procedure: LEFT HEART CATH AND CORS/GRAFTS ANGIOGRAPHY;  Surgeon: Nelva Bush, MD;  Location: [place] [facility];  Service: Cardiovascular;  Laterality: N/A;   LEFT HEART CATH AND CORS/GRAFTS ANGIOGRAPHY N/A 12/19/2020   Procedure: LEFT HEART CATH AND CORS/GRAFTS ANGIOGRAPHY;  Surgeon: Burnell Blanks, MD;  Location: [place] [facility];  Service: Cardiovascular;  Laterality: N/A;   METATARSAL HEAD EXCISION Right 05/02/2020   Procedure: FIRST METATARSAL HEAD RESECTION; RIGHT FOOT WOUND CLOSURE;  Surgeon: Landis Martins, DPM;  Location: [place];  Service: Podiatry;  Laterality: Right;  MAC W/LOCAL   TUBAL LIGATION       Home Medications:  Prior to Admission medications   Medication Sig Start Date End Date Taking? Authorizing Provider  amiodarone (PACERONE) 200 MG tablet Take 1 tablet (200 mg total) by mouth daily. 02/14/21  Yes Richardo Priest, MD  aspirin EC 81 MG tablet Take 1 tablet (81 mg total) by mouth daily. 05/13/20  Yes Richardo Priest, MD  clopidogrel (PLAVIX) 75 MG tablet Take 1 tablet by mouth once daily 04/05/21   Yes Munley, Hilton Cork, MD  Cyanocobalamin (VITAMIN B 12 PO) Take 1 tablet by mouth at bedtime.   Yes [provider]  Evolocumab (REPATHA SURECLICK) 202 MG/ML SOAJ Inject 140 mg into the skin every 14 (fourteen) days. Patient taking differently: Inject 140 mg into the skin every 28 (twenty-eight) days. 05/16/21  Yes Richardo Priest, MD  fenofibrate (TRICOR) 145 MG tablet Take 1 tablet (145 mg total) by mouth daily. 02/14/21  Yes Richardo Priest, MD  furosemide (LASIX) 40 MG tablet Take 1 tablet by mouth once daily 04/05/21  Yes Munley, Hilton Cork, MD  glipiZIDE (GLUCOTROL XL) 10 MG 24 hr tablet Take 10 mg by mouth 2 (two) times daily. 12/06/20  Yes [provider]  LORazepam (ATIVAN) 0.5 MG tablet Take 0.5 mg by mouth daily as needed. 07/17/21  Yes [provider]  metFORMIN (GLUCOPHAGE) 500 MG tablet Take 500 mg by mouth 2 (two) times daily. 06/01/21  Yes [provider]  metoprolol succinate (TOPROL-XL) 50 MG 24 hr tablet Take 1 tablet (50 mg total) by mouth 3 (three) times daily. 02/14/21  Yes Richardo Priest, MD  nitroGLYCERIN (NITROSTAT) 0.4 MG SL tablet DISSOLVE ONE TABLET UNDER THE TONGUE EVERY 5 MINUTES AS  NEEDED FOR CHEST PAIN.  DO NOT EXCEED A TOTAL OF 3 DOSES IN 15 MINUTES 01/23/21  Yes Richardo Priest, MD  nystatin (MYCOSTATIN/NYSTOP) powder Apply 1 application topically daily.   Yes [provider]  omeprazole (PRILOSEC) 20 MG capsule Take 1 capsule by mouth daily. 05/07/21  Yes [provider]  omeprazole (PRILOSEC) 20 MG capsule Take 20 mg by mouth daily. 02/12/20  Yes [provider]  rosuvastatin (CRESTOR) 20 MG tablet Take 20 mg by mouth at bedtime. 05/04/21  Yes [provider]  TRESIBA FLEXTOUCH 100 UNIT/ML FlexTouch Pen Inject 30 Units into the skin daily. 04/25/21  Yes [provider]  vitamin C (ASCORBIC ACID) 250 MG tablet Take 250 mg by mouth at bedtime.   Yes [provider]  Vitamin D, Ergocalciferol,  (DRISDOL) 1.25 MG (50000 UNIT) CAPS capsule Take 1 capsule by mouth once a week. 04/12/21  Yes [provider]  valsartan (DIOVAN) 80 MG tablet Take 1 tablet (80 mg total) by mouth 2 (two) times daily. Patient not taking: No sig reported 01/11/21   Richardo Priest, MD    Inpatient Medications: Scheduled Meds:  amiodarone  200 mg Oral Daily   aspirin EC  81 mg Oral Daily   clopidogrel  75 mg Oral Daily   insulin aspart  0-20 Units Subcutaneous TID WC   insulin glargine-yfgn  15 Units Subcutaneous QHS   metoprolol succinate  50 mg Oral TID   pantoprazole  40 mg Oral Daily   rosuvastatin  20 mg Oral QHS   sodium chloride flush  3 mL Intravenous Q12H   Continuous Infusions:  PRN Meds: acetaminophen **OR** acetaminophen, nitroGLYCERIN  Allergies:    Allergies  Allergen Reactions   Chlorhexidine Gluconate Itching    Received CHG bath, began itching, required benadryl   Cat Hair Extract Other (See Comments)    Sneezing, watery eyes.   Tramadol Other (See Comments)    Hallucinations   Codeine Rash    Social History:   Social History   Socioeconomic History   Marital status: Married    Spouse name: Not on file   Number of children: Not on file   Years of education: Not on file   Highest education level: Not on file  Occupational History   Not on file  Tobacco Use   Smoking status: Former    Types: Cigarettes    Quit date: 05/2016    Years since quitting: 5.1   Smokeless tobacco: Never  Vaping Use   Vaping Use: Never used  Substance and Sexual Activity   Alcohol use: No   Drug use: No   Sexual activity: Yes  Other Topics Concern   Not on file  Social History Narrative   Not on file   Social Determinants of Health   Financial Resource Strain: Not on file  Food Insecurity: Not on file  Transportation Needs: Not on file  Physical Activity: Not on file  Stress: Not on file  Social Connections: Not on file  Intimate Partner Violence: Not on file     Family History:    Family History  Problem Relation Age of Onset   Hypertension Mother    Hyperlipidemia Mother    Heart attack Father    Heart disease Father    Hypertension Father    Alzheimer's disease Father    Hypertension Brother    Hyperlipidemia Brother      ROS:  Please see the history of present illness.  All other ROS reviewed and negative.     Physical Exam/Data:   Vitals:   07/20/21 1140 07/20/21 1200 07/20/21 1315 07/20/21 1316  BP: (!) 148/87 (!) 153/80 136/78 136/78  Pulse: 80 72 75 79  Resp: (!) _0 Temp:    98.1 F (36.7 C)  TempSrc:    Oral  SpO2: 100% 100% 98% 100%   No intake or output data in the 24 hours ending 07/20/21 1415 Last 3 Weights 05/24/2021 05/16/2021 02/14/2021  Weight (lbs) 248 lb 247 lb 239 lb 12.8 oz  Weight (kg) 112.492 kg 112.038 kg 108.773 kg     There is no height or weight on file to calculate BMI.  General:  Well nourished, well developed, in no acute distress HEENT: normal Lymph: no adenopathy Neck: no JVD Endocrine:  No thryomegaly Vascular: No carotid bruits; FA pulses 2+ bilaterally without bruits  Cardiac:  normal S1, S2; RRR; no murmur  Lungs:  clear to auscultation bilaterally, no wheezing, rhonchi or rales  Abd: soft, nontender, no hepatomegaly  Ext: no edema Musculoskeletal:  No deformities, BUE and BLE strength normal and equal Skin: warm and dry  Neuro:  CNs 2-12 intact, no focal abnormalities noted Psych:  Normal affect   EKG:  The EKG was personally reviewed and demonstrates: normal sinus rhythm, right bundle branch block, wide QRS  Telemetry:  Telemetry was personally reviewed and demonstrates:  normal sinus rhythm  Relevant CV Studies:  LHC 12/19/2020 2nd Diag lesion is 80% stenosed. Mid LAD lesion is 100% stenosed. Ost LAD to Mid LAD lesion is 50% stenosed. 1st Mrg-1 lesion is 90% stenosed. 1st Mrg-2 lesion is 100% stenosed. Prox RCA lesion is 30% stenosed. RPDA lesion is 100%  stenosed. RPAV lesion is 90% stenosed. LIMA graft was not injected. Origin lesion is 100% stenosed. SVG graft was not injected. Origin lesion is 100% stenosed. SVG graft was not injected. Origin lesion is 100% stenosed. LIMA graft was visualized by angiography and is normal in caliber. Mid RCA lesion is 40% stenosed. Previously placed Ost LM to Mid LM stent (unknown type) is widely patent. Ost Cx to Prox Cx lesion is 99% stenosed.   1. Severe triple vessel CAD s/p CABG with patent LIMA graft but 3 known occluded vein grafts 2. Patent left main stent without restenosis. 3. Chronic occlusion of the mid LAD. The mid and distal LAD fills from the patent LIMA graft. The proximal LAD is patent and fills two small caliber diagonal branches. Both diagonal branches have disease but given small caliber of vessels, not favorable for PCI. The vein graft to the diagonal is known to be occluded. 4. The Circumflex is overall small in caliber with diffuse severe proximal disease. The graft to the OM is known to be occluded. 5. The RCA is a large dominant vessel. The vessel is patent. The mid to distal stented segment is patent. The proximal segment of the stent has mild restenosis. There is a severe stenosis in the small caliber posterolateral artery, unchanged from last cath and too small for PCI. Known occlusion of the SVG to the PDA. The PDA is chronically occluded.   Recommendations: No focal targets for PCI. Continue medical management of CAD. EP to see today to discuss ICD.  Echo 12/18/2020 1. Left ventricular ejection fraction, by estimation, is 45 to 50%. The  left ventricle has mildly decreased function. The left ventricle  demonstrates regional wall motion abnormalities (see scoring  diagram/findings for description). Left ventricular  diastolic parameters are indeterminate.   2. Right ventricular systolic function is normal. The right ventricular  size is normal. Tricuspid regurgitation signal  is inadequate for assessing  PA pressure.   3. The mitral valve is grossly normal. Trivial mitral valve  regurgitation.   4. The aortic valve is tricuspid. There is moderate calcification of the  aortic valve. Aortic valve regurgitation is not visualized.   5. The inferior vena cava is normal in size with greater than 50%  respiratory variability, suggesting right atrial pressure of 3 mmHg.   Laboratory Data:  High Sensitivity Troponin:   Recent Labs  Lab 07/20/21 0104 07/20/21 0327 07/20/21 1232  TROPONINIHS 9 18* 33*     Chemistry Recent Labs  Lab 07/20/21 0104  NA 136  K 5.1  CL 101  CO2 25  GLUCOSE 489*  BUN 19  CREATININE 1.48*  CALCIUM 10.4*  GFRNONAA 40*  ANIONGAP 10    Recent Labs  Lab 07/20/21 0104  PROT 7.3  ALBUMIN 3.7  AST 39  ALT 34  ALKPHOS 40  BILITOT 0.6   Hematology Recent Labs  Lab 07/20/21 0104  WBC 7.0  RBC 3.88  HGB 11.2*  HCT 34.8*  MCV 89.7  MCH 28.9  MCHC 32.2  RDW 13.5  PLT 260   BNP Recent Labs  Lab 07/20/21 0104  BNP 86.1    DDimer No results for input(s): DDIMER in the last 168 hours.   Radiology/Studies:  DG Chest 2 View  Result Date: 07/20/2021 CLINICAL DATA:  Chest pain. EXAM: CHEST - 2 VIEW COMPARISON:  Chest radiograph dated 05/07/2021. FINDINGS: Shallow inspiration. No focal consolidation, pleural effusion or pneumothorax. The cardiac silhouette is within limits. Left pectoral AICD device. Degenerative changes of the spine. No acute osseous pathology. IMPRESSION: No active cardiopulmonary disease. Electronically Signed   By: Anner Crete M.D.   On: 07/20/2021 01:43     Assessment and Plan:   NSTEMI with history of occlusive coronary artery disease s/p CABG with subsequent PCI, DES of RCA, left main d/t re-stenosis Ms. Klar is presenting with anginal chest pain with a significant history of coronary artery disease. She is currently free of chest pain and hemodynamically stable. Troponin 9>18>30 thus  far. No acute ECG changes. No signs of acute heart failure or obvious underlying pathology that would demonstrate demand ischemia. Given her significant cardiac history, most likely this would be type I NSTEMI. She has known vein graft occlusions with previous restenosis of multiple grafts. I think anticoagulation would be reasonable for Ms. Deihl with the elevated troponins. Will plan for cath lab tomorrow to determine medical management vs further stenting. - Start heparin gtt - Schedule cath tomorrow - Continue rosuvastatin - SL nitro PRN  2. HFpEF Last Echo January of this year, which revealed EF 45-50% with regional wall abnormalities. On exam Ms. Custodio appears euvolemic. Continue with home regimen - Metoprolol 21m three times daily   3. History of ventricular tachycardia ICD placed in January of this year. She has remained in normal sinus rhythm. Patient has had normal HR since arrival. No signs of decompensation. - Continue amiodarone 2069mdaily  4. Uncontrolled type 2 diabetes mellitus Most recent A1c >11%. Can consider GLP1 at discharge given obesity. Management per primary.  5. Hyperlipidemia Lipid panel today revealed LDL 56. Continue with high-intensity statin and PCSK9 inhibitor.   Please wait for attending's attestation for final recommendations.  Risk Assessment/Risk Scores:     TIMI Risk Score for Unstable Angina  or Non-ST Elevation MI:   The patient's TIMI risk score is 4, which indicates a 20% risk of all cause mortality, new or recurrent myocardial infarction or need for urgent revascularization in the next 14 days. For questions or updates, please contact [place] Please consult [email or web address] for contact info under   Signed, Sanjuan Dame, MD  07/20/2021 2:15 PM

## 2021-07-20 NOTE — Plan of Care (Signed)

## 2021-07-20 NOTE — ED Notes (Signed)
Echo at bedside

## 2021-07-20 NOTE — Progress Notes (Addendum)
ANTICOAGULATION CONSULT NOTE - Follow Up Consult  Pharmacy Consult for heparin Indication:  NSTEMI  Labs: Recent Labs    07/20/21 0104 07/20/21 0327 07/20/21 1232 07/20/21 1651 07/20/21 1652 07/20/21 2221  HGB 11.2*  --   --   --   --   --   HCT 34.8*  --   --   --   --   --   PLT 260  --   --   --   --   --   LABPROT  --   --   --  14.1  --   --   INR  --   --   --  1.1  --   --   HEPARINUNFRC  --   --   --   --  <0.10* 0.27*  CREATININE 1.48*  --   --   --   --   --   TROPONINIHS 9 18* 33*  --   --  27*    Assessment: 59yo female subtherapeutic on heparin with initial dosing for NSTEMI.  Goal of Therapy:  Heparin level 0.3-0.7 units/ml   Plan:  Will increase heparin infusion by 2 units/kgABW/hr to 1300 units/hr and check level in 6 hours.    Wynona Neat, PharmD, BCPS  07/20/2021,11:33 PM   Addendum: Heparin level remains below goal with lower level (0.22) despite increased rate. Will increase heparin gtt by 3 units/kg/hr to 1600 units/hr and check level in 6 hours.  VB 07/21/2021 7:07 AM

## 2021-07-20 NOTE — ED Notes (Signed)
Attempted IV twice with no success. IV team consult placed for admission and troponin draw.

## 2021-07-20 NOTE — H&P (Addendum)
Date: 07/20/2021               Patient Name:  Jasmine Richardson MRN: [id number]  DOB: Apr 16, 1961 Age / Sex: 60 y.o., female   PCP: Nicholos Johns, MD         Medical Service: Internal Medicine Teaching Service         Attending Physician: Dr. Jimmye Norman, Elaina Pattee, MD    First Contact: Dr. Jeanice Lim Pager: [phone]  Second Contact: Dr. Marva Panda Pager: [phone]       After Hours (After 5p/  First Contact Pager: [phone]  weekends / holidays): Second Contact Pager: [phone]   Chief Complaint: Chest pain  History of Present Illness:   Jasmine Richardson is a 60 y/o female with a PMHx of CAD s/p CABG (2017) with several complications including in-stent stenosis of RCA (PCI + DES 05/2020) and left main (PCI + DES + lithotripsy 06/2020), ventricular tachycardia s/p ICD placement (12/2020), HFpEF, HTN, HLD, T2DM who presents to the ED with c/p chest pain.   Jasmine Richardson states that for the past 1 week, she has been experiencing worsening chest pain when taking the stairs at her home.  In addition, she experiences significant shortness of breath and fatigue at the same time.  This was a drastic change in her exercise tolerance.  Then, last night she was resting on the couch when she developed sudden onset chest pain without radiation.  Jasmine Richardson assumed that it was indigestion at first and took several Tums without resolution.  Chest pain was persistent and gradually worsening until it became severe enough that patient took a nitroglycerin.  She eventually had improvement in her chest pain after the nitroglycerin.  Jasmine Richardson denies any nausea, vomiting, abdominal pain, dizziness, headache, diaphoresis, leg swelling, orthopnea.  She denies any recent sick contacts.  ED Course:  On arrival to the ED, patient was hypertensive at 180/94 with heart rate of 85.  On repeat vitals, patient's blood pressure improved to 142/74.  She is saturating at 99% on room air.  Initial lab work demonstrated a troponin of  9 that increased to 18.  CMP showed marked hyperglycemia with an elevation in creatinine to 1.48 and calcium to 10.4.  CBC is stable and unchanged from prior.  BMP is negative at 86.  Chest x-ray demonstrated no active cardiopulmonary disease.  Cardiology was consulted by ED provider.  IMTS was consulted for admission.  Meds:  Current Meds  Medication Sig   amiodarone (PACERONE) 200 MG tablet Take 1 tablet (200 mg total) by mouth daily.   aspirin EC 81 MG tablet Take 1 tablet (81 mg total) by mouth daily.   clopidogrel (PLAVIX) 75 MG tablet Take 1 tablet by mouth once daily   Cyanocobalamin (VITAMIN B 12 PO) Take 1 tablet by mouth at bedtime.   Evolocumab (REPATHA SURECLICK) [id number] MG/ML SOAJ Inject 140 mg into the skin every 14 (fourteen) days. (Patient taking differently: Inject 140 mg into the skin every 28 (twenty-eight) days.)   fenofibrate (TRICOR) 145 MG tablet Take 1 tablet (145 mg total) by mouth daily.   furosemide (LASIX) 40 MG tablet Take 1 tablet by mouth once daily   glipiZIDE (GLUCOTROL XL) 10 MG 24 hr tablet Take 10 mg by mouth 2 (two) times daily.   LORazepam (ATIVAN) 0.5 MG tablet Take 0.5 mg by mouth daily as needed.   metFORMIN (GLUCOPHAGE) 500 MG tablet Take 500 mg by mouth 2 (two) times daily.   metoprolol  succinate (TOPROL-XL) 50 MG 24 hr tablet Take 1 tablet (50 mg total) by mouth 3 (three) times daily.   nitroGLYCERIN (NITROSTAT) 0.4 MG SL tablet DISSOLVE ONE TABLET UNDER THE TONGUE EVERY 5 MINUTES AS NEEDED FOR CHEST PAIN.  DO NOT EXCEED A TOTAL OF 3 DOSES IN 15 MINUTES   nystatin (MYCOSTATIN/NYSTOP) powder Apply 1 application topically daily.   omeprazole (PRILOSEC) 20 MG capsule Take 1 capsule by mouth daily.   omeprazole (PRILOSEC) 20 MG capsule Take 20 mg by mouth daily.   rosuvastatin (CRESTOR) 20 MG tablet Take 20 mg by mouth at bedtime.   TRESIBA FLEXTOUCH 100 UNIT/ML FlexTouch Pen Inject 30 Units into the skin daily.   vitamin C (ASCORBIC ACID) 250 MG tablet Take  250 mg by mouth at bedtime.   Vitamin D, Ergocalciferol, (DRISDOL) 1.25 MG (50000 UNIT) CAPS capsule Take 1 capsule by mouth once a week.   [DISCONTINUED] clopidogrel (PLAVIX) 75 MG tablet Take 75 mg by mouth daily.   [DISCONTINUED] LORazepam (ATIVAN) 1 MG tablet Take 1 mg by mouth daily as needed for anxiety.   [DISCONTINUED] metFORMIN (GLUCOPHAGE-XR) 500 MG 24 hr tablet Take 1,000 mg by mouth 2 (two) times daily.   Allergies: Allergies as of 07/20/2021 - Review Complete 07/20/2021  Allergen Reaction Noted   Chlorhexidine gluconate Itching 12/18/2020   Cat hair extract Other (See Comments) 07/20/2021   Tramadol Other (See Comments) 02/26/2020   Codeine Rash 09/10/2015   Past Medical History:  Diagnosis Date   Acute chest pain 05/08/2016   Angina pectoris ([place]) 09/11/2017   Burping 05/08/2016   Chronic diastolic heart failure ([place]) 11/06/2016   Coronary artery disease involving native coronary artery of native heart with angina pectoris ([place]) 09/12/2015   Overview:  PCI and stent of RCA 2009, last cath 2012 with medical therapy  CABG May 2017   Emphysema lung ([place]) 09/11/2017   Essential hypertension 09/12/2015   GERD (gastroesophageal reflux disease) 05/08/2016   Hyperlipidemia 09/12/2015   Mediastinitis 06/28/2016   Morbid obesity ([place]) 05/08/2016   S/P CABG (coronary artery bypass graft) 06/03/2016   Overview:  The patient underwent sternal reconstruction on 06/21/16 with pec flaps for mediastinitis from a prior CABG in May 2017. On admission, she was critically ill from sepsis and had altered mental status. She was last seen in clinic on 08/09/16 at which time she was doing well.   Severe sepsis ([place]) 06/03/2016   Sinus tachycardia 05/08/2016   Tobacco use disorder 04/20/2016   Overview:  Quit in May 2017.   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   Physical Exam: Blood pressure 136/78, pulse 90, temperature 98.1 F (36.7 C), temperature source Oral, resp. rate 17, height '5\' 7"'$  (1.702 m), weight 112.5 kg, SpO2 94 %.  Physical Exam Vitals and nursing note reviewed.  Constitutional:      General: She is not in acute distress.    Appearance: She is obese.  HENT:     Head: Normocephalic and atraumatic.  Eyes:     Extraocular Movements: Extraocular movements intact.     Pupils: Pupils are equal, round, and reactive to light.  Cardiovascular:     Rate and Rhythm: Normal rate and regular rhythm.     Heart sounds: Murmur (systolic 1/6) heard.    No S3 or S4 sounds.  Pulmonary:     Effort: Pulmonary effort is normal. No tachypnea or respiratory distress.     Breath sounds: No decreased breath sounds, wheezing, rhonchi or rales.  Abdominal:     Palpations: Abdomen is soft. There is no mass.     Tenderness: There is no abdominal tenderness.  Musculoskeletal:     Right lower leg: No tenderness. No edema.     Left lower leg: No tenderness. No edema.  Skin:    General: Skin is warm and dry.  Neurological:     General: No focal deficit present.     Mental Status: She is alert and oriented to person, place, and time.  Psychiatric:        Mood and Affect: Mood normal.        Behavior: Behavior normal.   EKG: personally reviewed my interpretation is: Sinus rhythm with stable RBBB. Compared to EKG in May 2022, no significant changes.   CXR: personally reviewed my  interpretation is: No evidence of infiltrates, vascular congestion or pleural effusions.   Assessment & Plan by Problem: Active Problems:   Unstable angina [facility])  Jasmine Richardson is a 60 y/o female with a PMHx of CAD s/p CABG (2017) with several complications including in-stent stenosis of RCA (PCI + DES 05/2020) and left main (PCI + DES + lithotripsy 06/2020), ventricular tachycardia s/p ICD placement (12/2020), HFpEF, HTN, HLD, T2DM who presents to the ED with complaints of chest pain currently admitted for unstable angina.  # Unstable Angina  # Hx of Severe CAD s/p CABG, multiple DES Per patient's history, it seems she initially was experiencing stable angina with pain only with exertion that has now progressed to an unstable state.  Troponins were initially negative with a flat rise from 9 to 18.  EKG is unchanged.  Chest pain is currently resolved after administration of nitroglycerin.    Lipid panel obtained and LDL within goal at less than 70.   Addendum: Third troponin has resulted in increased 30.  Cardiology has evaluated patient and feel this is most consistent with NSTEMI.  Heparin drip initiated with plans for catheterization tomorrow.  - Cardiology consulted; appreciate their recommendations - Heparin per pharmacy - Restart home aspirin and Plavix - Restart home rosuvastatin - Restart home metoprolol  # Hypertension  Initially quite hypertensive on admission that has since improved.  Patient confirms that she is taking metoprolol as prescribed but cannot verify valsartan use.  We will hold valsartan for now given jump in creatinine and plans for catheterization.  - Restart home metoprolol  # Chronic Kidney Disease Patient's baseline creatinine approximately 0.8 with a steady increase over the last 6 months.  Creatinine on admission elevated at 1.48.  Etiology undetermined at this time, however I suspect secondary to diabetes and/or hypertension.  May benefit from  outpatient follow-up with nephrology.  - Hold valsartan - Repeat BMP in the a.m. - Protein/creatinine ratio  - Microalbumin/creatinine ratio - Renal ultrasound  # Type 2  Diabetes Mellitus  Uncontrolled with last A1c of 11.2%.  Current home regimen includes Tresiba, glipizide and metformin.  - Lantus 15 units at bedtime - SSI, resistant scale - A1c pending  # Hyperlipidemia Patient has a history of profound hyperlipidemia currently being managed with maximum dose statin and Repatha.  Lipid panel was reevaluated on admission and shows LDL has improved to <70.  - Continue rosuvastatin  # Ventricular Tachycardia s/p ICD - Restart home amiodarone  # Hypercalcemia  Mild but persistent over the last several months.  - PTH pending  Diet: Heart Healthy/CM VTE: Heparin IVF: None,None Code: Full  Prior to Admission Living Arrangement: Home, living with her husband and son Anticipated Discharge Location: Home Barriers to Discharge: Continued medical evaluation and stabilization  Dispo: Admit patient to Inpatient with expected length of stay greater than 2 midnights.  Signed: Dr. Jose Persia Internal Medicine PGY-3 Pager: [phone] After 5pm on weekdays and 1pm on weekends: On Call pager [phone]  07/20/2021, 4:09 PM

## 2021-07-20 NOTE — ED Provider Notes (Signed)
Emergency Medicine Provider Triage Evaluation Note  Jasmine Richardson , a 60 y.o. female  was evaluated in triage.  Pt complains of chest pain.  The patient reports a sudden onset, constant, worsening chest pain that began suddenly at 2300 while she was watching the news.  She initially stated that the pain felt like indigestion, but now feels pressure-like and as if she has an elephant sitting on her chest.  She reports associated nausea and paresthesias radiating down the left arm lightheadedness.  She took 1 NTG at home with brief improvement in her symptoms, but not resolution.  She takes a baby aspirin daily at home.  She is unsure if this felt like her previous MI.  No fever, chills, shortness of breath, leg swelling, abdominal pain, diaphoresis, dizziness, vomiting, diarrhea, cough.  Review of Systems  Positive: Chest pain, paresthesias, nausea, lightheadedness Negative: Shortness of breath, abdominal pain, vomiting, fever, chills, cough, leg swelling, dizziness, diarrhea, diaphoresis  Physical Exam  BP (!) 180/94 (BP Location: Left Arm)   Pulse 85   Temp 97.6 F (36.4 C) (Oral)   Resp 18   LMP  (LMP Unknown)   SpO2 99%  Gen:   Awake, no distress   Resp:  Normal effort  MSK:   Moves extremities without difficulty  Other:  Abdomen is soft and nontender  Medical Decision Making  Medically screening exam initiated at 1:07 AM.  Appropriate orders placed.  Jasmine Richardson was informed that the remainder of the evaluation will be completed by another provider, this initial triage assessment does not replace that evaluation, and the importance of remaining in the ED until their evaluation is complete.  Patient's work-up has been initiated in the emergency department.  Per chart review, she has an extensive history of CAD.  Unable to give ASA as it is not available in triage area.  Labs and imaging of been ordered.  She will require further work-up and evaluation in the emergency department.    Joline Maxcy A, PA-C 07/20/21 0107    Ezequiel Essex, MD 07/20/21 [phone]

## 2021-07-20 NOTE — ED Notes (Signed)
Lunch tray ordered 

## 2021-07-21 ENCOUNTER — Encounter (HOSPITAL_COMMUNITY): Payer: Self-pay | Admitting: Internal Medicine

## 2021-07-21 ENCOUNTER — Other Ambulatory Visit: Payer: Self-pay

## 2021-07-21 ENCOUNTER — Encounter (HOSPITAL_COMMUNITY)
Admission: EM | Disposition: A | Discharge status: Home / Self Care | Payer: Self-pay | Source: Home / Self Care | Attending: Internal Medicine

## 2021-07-21 DIAGNOSIS — I214 Non-ST elevation (NSTEMI) myocardial infarction: Secondary | ICD-10-CM | POA: Diagnosis not present

## 2021-07-21 DIAGNOSIS — I2510 Atherosclerotic heart disease of native coronary artery without angina pectoris: Secondary | ICD-10-CM

## 2021-07-21 DIAGNOSIS — I25810 Atherosclerosis of coronary artery bypass graft(s) without angina pectoris: Secondary | ICD-10-CM

## 2021-07-21 DIAGNOSIS — I5042 Chronic combined systolic (congestive) and diastolic (congestive) heart failure: Secondary | ICD-10-CM

## 2021-07-21 DIAGNOSIS — N179 Acute kidney failure, unspecified: Secondary | ICD-10-CM

## 2021-07-21 DIAGNOSIS — I10 Essential (primary) hypertension: Secondary | ICD-10-CM

## 2021-07-21 DIAGNOSIS — I252 Old myocardial infarction: Secondary | ICD-10-CM

## 2021-07-21 DIAGNOSIS — I25119 Atherosclerotic heart disease of native coronary artery with unspecified angina pectoris: Secondary | ICD-10-CM | POA: Diagnosis not present

## 2021-07-21 DIAGNOSIS — Z951 Presence of aortocoronary bypass graft: Secondary | ICD-10-CM

## 2021-07-21 HISTORY — PX: LEFT HEART CATH AND CORS/GRAFTS ANGIOGRAPHY: CATH118250

## 2021-07-21 LAB — HIV ANTIBODY (ROUTINE TESTING W REFLEX): HIV Screen 4th Generation wRfx: NONREACTIVE

## 2021-07-21 LAB — CBC
HCT: 32.1 % — ABNORMAL LOW (ref 36.0–46.0)
Hemoglobin: 10.4 g/dL — ABNORMAL LOW (ref 12.0–15.0)
MCH: 28.9 pg (ref 26.0–34.0)
MCHC: 32.4 g/dL (ref 30.0–36.0)
MCV: 89.2 fL (ref 80.0–100.0)
Platelets: 229 10*3/uL (ref 150–400)
RBC: 3.6 MIL/uL — ABNORMAL LOW (ref 3.87–5.11)
RDW: 13.3 % (ref 11.5–15.5)
WBC: 7.1 10*3/uL (ref 4.0–10.5)
nRBC: 0 % (ref 0.0–0.2)

## 2021-07-21 LAB — BASIC METABOLIC PANEL
Anion gap: 8 (ref 5–15)
BUN: 19 mg/dL (ref 6–20)
CO2: 25 mmol/L (ref 22–32)
Calcium: 9.6 mg/dL (ref 8.9–10.3)
Chloride: 103 mmol/L (ref 98–111)
Creatinine, Ser: 1.22 mg/dL — ABNORMAL HIGH (ref 0.44–1.00)
GFR, Estimated: 51 mL/min — ABNORMAL LOW (ref >60)
Glucose, Bld: 319 mg/dL — ABNORMAL HIGH (ref 70–99)
Potassium: 4.1 mmol/L (ref 3.5–5.1)
Sodium: 136 mmol/L (ref 135–145)

## 2021-07-21 LAB — GLUCOSE, CAPILLARY
Glucose-Capillary: 281 mg/dL — ABNORMAL HIGH (ref 70–99)
Glucose-Capillary: 213 mg/dL — ABNORMAL HIGH (ref 70–99)
Glucose-Capillary: 157 mg/dL — ABNORMAL HIGH (ref 70–99)
Glucose-Capillary: 249 mg/dL — ABNORMAL HIGH (ref 70–99)

## 2021-07-21 LAB — HEPARIN LEVEL (UNFRACTIONATED)
Heparin Unfractionated: 0.22 IU/mL — ABNORMAL LOW (ref 0.30–0.70)
Heparin Unfractionated: 0.49 IU/mL (ref 0.30–0.70)

## 2021-07-21 LAB — TROPONIN I (HIGH SENSITIVITY): Troponin I (High Sensitivity): 22 ng/L — ABNORMAL HIGH (ref <18)

## 2021-07-21 LAB — PROTEIN / CREATININE RATIO, URINE
Creatinine, Urine: 131.66 mg/dL
Protein Creatinine Ratio: 0.46 mg/mg{Cre} — ABNORMAL HIGH (ref 0.00–0.15)
Total Protein, Urine: 61 mg/dL

## 2021-07-21 SURGERY — LEFT HEART CATH AND CORS/GRAFTS ANGIOGRAPHY
Anesthesia: LOCAL

## 2021-07-21 MED ORDER — INSULIN GLARGINE-YFGN 100 UNIT/ML ~~LOC~~ SOLN
20.0000 [IU] | Freq: Every day | SUBCUTANEOUS | Status: DC
  Administered 2021-07-21: 20 [IU] via SUBCUTANEOUS
  Filled 2021-07-21 (×2): qty 0.2

## 2021-07-21 MED ORDER — FENTANYL CITRATE (PF) 100 MCG/2ML IJ SOLN
INTRAMUSCULAR | Status: AC
  Filled 2021-07-21: qty 2

## 2021-07-21 MED ORDER — FENTANYL CITRATE (PF) 100 MCG/2ML IJ SOLN
INTRAMUSCULAR | Status: DC | PRN
  Administered 2021-07-21 (×2): 25 ug via INTRAVENOUS

## 2021-07-21 MED ORDER — HEPARIN (PORCINE) IN NACL 1000-0.9 UT/500ML-% IV SOLN
INTRAVENOUS | Status: DC | PRN
  Administered 2021-07-21 (×2): 500 mL

## 2021-07-21 MED ORDER — LIDOCAINE HCL (PF) 1 % IJ SOLN
INTRAMUSCULAR | Status: AC
  Filled 2021-07-21: qty 30

## 2021-07-21 MED ORDER — MIDAZOLAM HCL 2 MG/2ML IJ SOLN
INTRAMUSCULAR | Status: AC
  Filled 2021-07-21: qty 2

## 2021-07-21 MED ORDER — LABETALOL HCL 5 MG/ML IV SOLN: 10.0000 mg | INTRAVENOUS | Status: AC | PRN

## 2021-07-21 MED ORDER — SODIUM CHLORIDE 0.9 % IV SOLN: INTRAVENOUS | Status: AC

## 2021-07-21 MED ORDER — HEPARIN (PORCINE) IN NACL 1000-0.9 UT/500ML-% IV SOLN
INTRAVENOUS | Status: AC
  Filled 2021-07-21: qty 500

## 2021-07-21 MED ORDER — SODIUM CHLORIDE 0.9 % IV SOLN: 250.0000 mL | INTRAVENOUS | Status: DC | PRN

## 2021-07-21 MED ORDER — MIDAZOLAM HCL 2 MG/2ML IJ SOLN
INTRAMUSCULAR | Status: DC | PRN
  Administered 2021-07-21 (×2): 1 mg via INTRAVENOUS

## 2021-07-21 MED ORDER — SODIUM CHLORIDE 0.9% FLUSH: 3.0000 mL | INTRAVENOUS | Status: DC | PRN

## 2021-07-21 MED ORDER — LIDOCAINE HCL (PF) 1 % IJ SOLN
INTRAMUSCULAR | Status: DC | PRN
  Administered 2021-07-21: 8 mL

## 2021-07-21 MED ORDER — IOHEXOL 350 MG/ML SOLN
INTRAVENOUS | Status: DC | PRN
  Administered 2021-07-21: 70 mL

## 2021-07-21 MED ORDER — SODIUM CHLORIDE 0.9% FLUSH: 3.0000 mL | Freq: Two times a day (BID) | INTRAVENOUS | Status: DC

## 2021-07-21 MED ORDER — HYDRALAZINE HCL 20 MG/ML IJ SOLN: 10.0000 mg | INTRAMUSCULAR | Status: AC | PRN

## 2021-07-21 MED ORDER — ENOXAPARIN SODIUM 40 MG/0.4ML IJ SOSY
40.0000 mg | PREFILLED_SYRINGE | INTRAMUSCULAR | Status: DC
  Administered 2021-07-22: 40 mg via SUBCUTANEOUS
  Filled 2021-07-21: qty 0.4

## 2021-07-21 SURGICAL SUPPLY — 11 items
CATH INFINITI 5 FR IM (CATHETERS) ×2 IMPLANT
CATH INFINITI 5 FR JL3.5 (CATHETERS) ×1 IMPLANT
CATH INFINITI 5FR MULTPACK ANG (CATHETERS) ×1 IMPLANT
KIT HEART LEFT (KITS) ×2 IMPLANT
KIT MICROPUNCTURE NIT STIFF (SHEATH) ×2 IMPLANT
MAT PREVALON FULL STRYKER (MISCELLANEOUS) ×2 IMPLANT
PACK CARDIAC CATHETERIZATION (CUSTOM PROCEDURE TRAY) ×2 IMPLANT
SHEATH PINNACLE 5F 10CM (SHEATH) ×1 IMPLANT
SHEATH PROBE COVER 6X72 (BAG) ×2 IMPLANT
TRANSDUCER W/STOPCOCK (MISCELLANEOUS) ×2 IMPLANT
WIRE EMERALD 3MM-J .035X150CM (WIRE) ×1 IMPLANT

## 2021-07-21 NOTE — Progress Notes (Signed)
Progress Note  Patient Name: Jasmine Richardson Date of Encounter: 07/21/2021  [facility] Cardiologist: Shirlee More, MD   Subjective   Feeling well.  Denies any recurrent chest pain.  Breathing is stable.  Inpatient Medications    Scheduled Meds:  amiodarone  200 mg Oral Daily   [START ON 07/22/2021] aspirin EC  81 mg Oral Daily   clopidogrel  75 mg Oral Daily   insulin aspart  0-20 Units Subcutaneous TID WC   insulin glargine-yfgn  15 Units Subcutaneous QHS   isosorbide mononitrate  30 mg Oral Daily   metoprolol succinate  50 mg Oral TID   pantoprazole  40 mg Oral Daily   rosuvastatin  20 mg Oral QHS   sodium chloride flush  3 mL Intravenous Q12H   sodium chloride flush  3 mL Intravenous Q12H   Continuous Infusions:  sodium chloride     sodium chloride 1 mL/kg/hr (07/21/21 0512)   heparin 1,600 Units/hr (07/21/21 0922)   PRN Meds: sodium chloride, acetaminophen **OR** acetaminophen, nitroGLYCERIN, sodium chloride flush   Vital Signs    Vitals:   07/20/21 2116 07/21/21 0040 07/21/21 0431 07/21/21 0754  BP: (!) 142/84 (!) 118/57 135/69 120/66  Pulse: 74 67 63 (!) 57  Resp: '18 17 15 18  '$ Temp: 98.1 F (36.7 C) 98 F (36.7 C) 97.9 F (36.6 C) 98 F (36.7 C)  TempSrc: Oral Oral Oral Oral  SpO2: 98% 98% 96% 98%  Weight:   112.9 kg   Height:        Intake/Output Summary (Last 24 hours) at 07/21/2021 1251 Last data filed at 07/21/2021 1208 Gross per 24 hour  Intake 738.74 ml  Output 550 ml  Net 188.74 ml   Last 3 Weights 07/21/2021 07/20/2021 07/20/2021  Weight (lbs) 249 lb 248 lb 14.4 oz 248 lb 0.3 oz  Weight (kg) 112.946 kg 112.9 kg 112.5 kg      Telemetry    Sinus rhythm.  No events- Personally Reviewed  ECG    N/a - Personally Reviewed  Physical Exam   VS:  BP 120/66 (BP Location: Left Arm)   Pulse (!) 57   Temp 98 F (36.7 C) (Oral)   Resp 18   Ht '5\' 7"'$  (1.702 m)   Wt 112.9 kg   LMP  (LMP Unknown)   SpO2 98%   BMI 39.00 kg/m  , BMI Body mass  index is 39 kg/m. GENERAL:  Well appearing HEENT: Pupils equal round and reactive, fundi not visualized, oral mucosa unremarkable NECK:  No jugular venous distention, waveform within normal limits, carotid upstroke brisk and symmetric, no bruits LUNGS:  Clear to auscultation bilaterally HEART:  RRR.  PMI not displaced or sustained,S1 and S2 within normal limits, no S3, no S4, no clicks, no rubs, no murmurs ABD:  Flat, positive bowel sounds normal in frequency in pitch, no bruits, no rebound, no guarding, no midline pulsatile mass, no hepatomegaly, no splenomegaly EXT:  2 plus pulses throughout, no edema, no cyanosis no clubbing SKIN:  No rashes no nodules NEURO:  Cranial nerves II through XII grossly intact, motor grossly intact throughout PSYCH:  Cognitively intact, oriented to person place and time   Labs    High Sensitivity Troponin:   Recent Labs  Lab 07/20/21 0104 07/20/21 0327 07/20/21 1232 07/20/21 2221 07/21/21 0356  TROPONINIHS 9 18* 33* 27* 22*      Chemistry Recent Labs  Lab 07/20/21 0104 07/21/21 0356  NA 136 136  K 5.1  4.1  CL 101 103  CO2 25 25  GLUCOSE 489* 319*  BUN 19 19  CREATININE 1.48* 1.22*  CALCIUM 10.4* 9.6  PROT 7.3  --   ALBUMIN 3.7  --   AST 39  --   ALT 34  --   ALKPHOS 40  --   BILITOT 0.6  --   GFRNONAA 40* 51*  ANIONGAP 10 8     Hematology Recent Labs  Lab 07/20/21 0104 07/21/21 0356  WBC 7.0 7.1  RBC 3.88 3.60*  HGB 11.2* 10.4*  HCT 34.8* 32.1*  MCV 89.7 89.2  MCH 28.9 28.9  MCHC 32.2 32.4  RDW 13.5 13.3  PLT 260 229    BNP Recent Labs  Lab 07/20/21 0104  BNP 86.1     DDimer No results for input(s): DDIMER in the last 168 hours.   Radiology    DG Chest 2 View  Result Date: 07/20/2021 CLINICAL DATA:  Chest pain. EXAM: CHEST - 2 VIEW COMPARISON:  Chest radiograph dated 05/07/2021. FINDINGS: Shallow inspiration. No focal consolidation, pleural effusion or pneumothorax. The cardiac silhouette is within limits.  Left pectoral AICD device. Degenerative changes of the spine. No acute osseous pathology. IMPRESSION: No active cardiopulmonary disease. Electronically Signed   By: Anner Crete M.D.   On: 07/20/2021 01:43   US RENAL  Result Date: 07/20/2021 CLINICAL DATA:  Chronic kidney disease EXAM: RENAL / URINARY TRACT ULTRASOUND COMPLETE COMPARISON:  None. FINDINGS: Right Kidney: Renal measurements: 12.1 x 5.7 x 6.0 cm = volume: 219.26 mL. Echogenicity within normal limits. No mass or hydronephrosis visualized. Left Kidney: Renal measurements: 11.8 x 5.4 x 5.0 cm = volume: 181.49 mL. Echogenicity is within normal limits. Simple appearing left renal cyst measuring 3.6 x 2.7 x 3.6 cm. Bladder: Mildly distended but unremarkable. Other: Partially visualized liver parenchyma which appears diffusely increased in echogenicity. IMPRESSION: No hydronephrosis. 3.6 cm simple appearing left renal cyst. Electronically Signed   By: Maurine Simmering   On: 07/20/2021 18:33    Cardiac Studies   Echo 12/18/2020: 1. Left ventricular ejection fraction, by estimation, is 45 to 50%. The  left ventricle has mildly decreased function. The left ventricle  demonstrates regional wall motion abnormalities (see scoring  diagram/findings for description). Left ventricular  diastolic parameters are indeterminate.   2. Right ventricular systolic function is normal. The right ventricular  size is normal. Tricuspid regurgitation signal is inadequate for assessing  PA pressure.   3. The mitral valve is grossly normal. Trivial mitral valve  regurgitation.   4. The aortic valve is tricuspid. There is moderate calcification of the  aortic valve. Aortic valve regurgitation is not visualized.   5. The inferior vena cava is normal in size with greater than 50%  respiratory variability, suggesting right atrial pressure of 3 mmHg.   LHC 12/2020:  2nd Diag lesion is 80% stenosed. Mid LAD lesion is 100% stenosed. Ost LAD to Mid LAD lesion is 50%  stenosed. 1st Mrg-1 lesion is 90% stenosed. 1st Mrg-2 lesion is 100% stenosed. Prox RCA lesion is 30% stenosed. RPDA lesion is 100% stenosed. RPAV lesion is 90% stenosed. LIMA graft was not injected. Origin lesion is 100% stenosed. SVG graft was not injected. Origin lesion is 100% stenosed. SVG graft was not injected. Origin lesion is 100% stenosed. LIMA graft was visualized by angiography and is normal in caliber. Mid RCA lesion is 40% stenosed. Previously placed Ost LM to Mid LM stent (unknown type) is widely patent. Ost Cx to Prox  Cx lesion is 99% stenosed.   1. Severe triple vessel CAD s/p CABG with patent LIMA graft but 3 known occluded vein grafts 2. Patent left main stent without restenosis. 3. Chronic occlusion of the mid LAD. The mid and distal LAD fills from the patent LIMA graft. The proximal LAD is patent and fills two small caliber diagonal branches. Both diagonal branches have disease but given small caliber of vessels, not favorable for PCI. The vein graft to the diagonal is known to be occluded. 4. The Circumflex is overall small in caliber with diffuse severe proximal disease. The graft to the OM is known to be occluded. 5. The RCA is a large dominant vessel. The vessel is patent. The mid to distal stented segment is patent. The proximal segment of the stent has mild restenosis. There is a severe stenosis in the small caliber posterolateral artery, unchanged from last cath and too small for PCI. Known occlusion of the SVG to the PDA. The PDA is chronically occluded.  Patient Profile     Jasmine Richardson is a 1F with CAD s/p CABG and PCI, VT s/p ICD, chronic systolic and diastolic heart failure, hypertension, hyperlipidemia, diabetes, CKD III admitted with chest pain and mildly elevated hs-troponin.   Assessment & Plan    # Chest pain:  # Elevated troponin:  # CAD s/p CABG/PCI:  # Hyperlipidemia:  Jasmine Richardson presented with anginal symptoms.  She last had a cath 12/2020  after an episode of VT where her LIMA-->LAD was patent but 3 SVGs were chronically occluded.  She has severe 3 vessel disease including a mid LAD occulusion with distal filling via the LIMA and proximal LAD collaterals to diagonal branches.  There RCA stent was patent with mild in stent restenosis.  High-sensitivity troponin is very mildly elevated.  Symptoms did improve with sublingual nitroglycerin.  Given her extensive cardiac history, plan for left heart cath today.  We added Imdur and continued her home aspirin, clopidogrel, metoprolol, rosuvastatin, and fenofibrate.  She is on Repatha at home.  Yesterday she noted that she only wanted a femoral cath because of bilateral hand neuropathic pain.  We discussed the fact that this would not have come from her prior radial caths.  Given her body habitus, I do think that a radial approach would be preferable.  She is going to think about this and will discuss with the person who will do the case.  # Chronic systolic and diastolic heart failure:  # AKI: LVEF 40 to 5 to 50% on cath 12/2019.  She is euvolemic.  Continue metoprolol.  Her home losartan and lasix are on hold due to AKI.  Renal function is improving.   # VT: Continue amiodarone and metoprolol.  She has an ICD.      For questions or updates, please contact [place] Please consult [email or web address] for contact info under        Signed, Skeet Latch, MD  07/21/2021, 12:51 PM

## 2021-07-21 NOTE — Discharge Instructions (Signed)
Current A1C is 12% on 07/20/21. Please call insurance and see which local Endocrinologist (Diabetes providers) are in network and what your copay would be.  Also ask about what copay would be for FreeStyle [place] 2 if you are interested in using it then ask primary care provider about prescribing it for you.

## 2021-07-21 NOTE — H&P (View-Only) (Signed)
Progress Note  Patient Name: Jasmine Richardson Date of Encounter: 07/21/2021  [facility] Cardiologist: Shirlee More, MD   Subjective   Feeling well.  Denies any recurrent chest pain.  Breathing is stable.  Inpatient Medications    Scheduled Meds:  amiodarone  200 mg Oral Daily   [START ON 07/22/2021] aspirin EC  81 mg Oral Daily   clopidogrel  75 mg Oral Daily   insulin aspart  0-20 Units Subcutaneous TID WC   insulin glargine-yfgn  15 Units Subcutaneous QHS   isosorbide mononitrate  30 mg Oral Daily   metoprolol succinate  50 mg Oral TID   pantoprazole  40 mg Oral Daily   rosuvastatin  20 mg Oral QHS   sodium chloride flush  3 mL Intravenous Q12H   sodium chloride flush  3 mL Intravenous Q12H   Continuous Infusions:  sodium chloride     sodium chloride 1 mL/kg/hr (07/21/21 0512)   heparin 1,600 Units/hr (07/21/21 0922)   PRN Meds: sodium chloride, acetaminophen **OR** acetaminophen, nitroGLYCERIN, sodium chloride flush   Vital Signs    Vitals:   07/20/21 2116 07/21/21 0040 07/21/21 0431 07/21/21 0754  BP: (!) 142/84 (!) 118/57 135/69 120/66  Pulse: 74 67 63 (!) 57  Resp: '18 17 15 18  '$ Temp: 98.1 F (36.7 C) 98 F (36.7 C) 97.9 F (36.6 C) 98 F (36.7 C)  TempSrc: Oral Oral Oral Oral  SpO2: 98% 98% 96% 98%  Weight:   112.9 kg   Height:        Intake/Output Summary (Last 24 hours) at 07/21/2021 1251 Last data filed at 07/21/2021 1208 Gross per 24 hour  Intake 738.74 ml  Output 550 ml  Net 188.74 ml   Last 3 Weights 07/21/2021 07/20/2021 07/20/2021  Weight (lbs) 249 lb 248 lb 14.4 oz 248 lb 0.3 oz  Weight (kg) 112.946 kg 112.9 kg 112.5 kg      Telemetry    Sinus rhythm.  No events- Personally Reviewed  ECG    N/a - Personally Reviewed  Physical Exam   VS:  BP 120/66 (BP Location: Left Arm)   Pulse (!) 57   Temp 98 F (36.7 C) (Oral)   Resp 18   Ht '5\' 7"'$  (1.702 m)   Wt 112.9 kg   LMP  (LMP Unknown)   SpO2 98%   BMI 39.00 kg/m  , BMI Body mass  index is 39 kg/m. GENERAL:  Well appearing HEENT: Pupils equal round and reactive, fundi not visualized, oral mucosa unremarkable NECK:  No jugular venous distention, waveform within normal limits, carotid upstroke brisk and symmetric, no bruits LUNGS:  Clear to auscultation bilaterally HEART:  RRR.  PMI not displaced or sustained,S1 and S2 within normal limits, no S3, no S4, no clicks, no rubs, no murmurs ABD:  Flat, positive bowel sounds normal in frequency in pitch, no bruits, no rebound, no guarding, no midline pulsatile mass, no hepatomegaly, no splenomegaly EXT:  2 plus pulses throughout, no edema, no cyanosis no clubbing SKIN:  No rashes no nodules NEURO:  Cranial nerves II through XII grossly intact, motor grossly intact throughout PSYCH:  Cognitively intact, oriented to person place and time   Labs    High Sensitivity Troponin:   Recent Labs  Lab 07/20/21 0104 07/20/21 0327 07/20/21 1232 07/20/21 2221 07/21/21 0356  TROPONINIHS 9 18* 33* 27* 22*      Chemistry Recent Labs  Lab 07/20/21 0104 07/21/21 0356  NA 136 136  K 5.1  4.1  CL 101 103  CO2 25 25  GLUCOSE 489* 319*  BUN 19 19  CREATININE 1.48* 1.22*  CALCIUM 10.4* 9.6  PROT 7.3  --   ALBUMIN 3.7  --   AST 39  --   ALT 34  --   ALKPHOS 40  --   BILITOT 0.6  --   GFRNONAA 40* 51*  ANIONGAP 10 8     Hematology Recent Labs  Lab 07/20/21 0104 07/21/21 0356  WBC 7.0 7.1  RBC 3.88 3.60*  HGB 11.2* 10.4*  HCT 34.8* 32.1*  MCV 89.7 89.2  MCH 28.9 28.9  MCHC 32.2 32.4  RDW 13.5 13.3  PLT 260 229    BNP Recent Labs  Lab 07/20/21 0104  BNP 86.1     DDimer No results for input(s): DDIMER in the last 168 hours.   Radiology    DG Chest 2 View  Result Date: 07/20/2021 CLINICAL DATA:  Chest pain. EXAM: CHEST - 2 VIEW COMPARISON:  Chest radiograph dated 05/07/2021. FINDINGS: Shallow inspiration. No focal consolidation, pleural effusion or pneumothorax. The cardiac silhouette is within limits.  Left pectoral AICD device. Degenerative changes of the spine. No acute osseous pathology. IMPRESSION: No active cardiopulmonary disease. Electronically Signed   By: Anner Crete M.D.   On: 07/20/2021 01:43   US RENAL  Result Date: 07/20/2021 CLINICAL DATA:  Chronic kidney disease EXAM: RENAL / URINARY TRACT ULTRASOUND COMPLETE COMPARISON:  None. FINDINGS: Right Kidney: Renal measurements: 12.1 x 5.7 x 6.0 cm = volume: 219.26 mL. Echogenicity within normal limits. No mass or hydronephrosis visualized. Left Kidney: Renal measurements: 11.8 x 5.4 x 5.0 cm = volume: 181.49 mL. Echogenicity is within normal limits. Simple appearing left renal cyst measuring 3.6 x 2.7 x 3.6 cm. Bladder: Mildly distended but unremarkable. Other: Partially visualized liver parenchyma which appears diffusely increased in echogenicity. IMPRESSION: No hydronephrosis. 3.6 cm simple appearing left renal cyst. Electronically Signed   By: Maurine Simmering   On: 07/20/2021 18:33    Cardiac Studies   Echo 12/18/2020: 1. Left ventricular ejection fraction, by estimation, is 45 to 50%. The  left ventricle has mildly decreased function. The left ventricle  demonstrates regional wall motion abnormalities (see scoring  diagram/findings for description). Left ventricular  diastolic parameters are indeterminate.   2. Right ventricular systolic function is normal. The right ventricular  size is normal. Tricuspid regurgitation signal is inadequate for assessing  PA pressure.   3. The mitral valve is grossly normal. Trivial mitral valve  regurgitation.   4. The aortic valve is tricuspid. There is moderate calcification of the  aortic valve. Aortic valve regurgitation is not visualized.   5. The inferior vena cava is normal in size with greater than 50%  respiratory variability, suggesting right atrial pressure of 3 mmHg.   LHC 12/2020:  2nd Diag lesion is 80% stenosed. Mid LAD lesion is 100% stenosed. Ost LAD to Mid LAD lesion is 50%  stenosed. 1st Mrg-1 lesion is 90% stenosed. 1st Mrg-2 lesion is 100% stenosed. Prox RCA lesion is 30% stenosed. RPDA lesion is 100% stenosed. RPAV lesion is 90% stenosed. LIMA graft was not injected. Origin lesion is 100% stenosed. SVG graft was not injected. Origin lesion is 100% stenosed. SVG graft was not injected. Origin lesion is 100% stenosed. LIMA graft was visualized by angiography and is normal in caliber. Mid RCA lesion is 40% stenosed. Previously placed Ost LM to Mid LM stent (unknown type) is widely patent. Ost Cx to Prox  Cx lesion is 99% stenosed.   1. Severe triple vessel CAD s/p CABG with patent LIMA graft but 3 known occluded vein grafts 2. Patent left main stent without restenosis. 3. Chronic occlusion of the mid LAD. The mid and distal LAD fills from the patent LIMA graft. The proximal LAD is patent and fills two small caliber diagonal branches. Both diagonal branches have disease but given small caliber of vessels, not favorable for PCI. The vein graft to the diagonal is known to be occluded. 4. The Circumflex is overall small in caliber with diffuse severe proximal disease. The graft to the OM is known to be occluded. 5. The RCA is a large dominant vessel. The vessel is patent. The mid to distal stented segment is patent. The proximal segment of the stent has mild restenosis. There is a severe stenosis in the small caliber posterolateral artery, unchanged from last cath and too small for PCI. Known occlusion of the SVG to the PDA. The PDA is chronically occluded.  Patient Profile     Ms. Luckenbill is a 4F with CAD s/p CABG and PCI, VT s/p ICD, chronic systolic and diastolic heart failure, hypertension, hyperlipidemia, diabetes, CKD III admitted with chest pain and mildly elevated hs-troponin.   Assessment & Plan    # Chest pain:  # Elevated troponin:  # CAD s/p CABG/PCI:  # Hyperlipidemia:  Ms. Cajas presented with anginal symptoms.  She last had a cath 12/2020  after an episode of VT where her LIMA-->LAD was patent but 3 SVGs were chronically occluded.  She has severe 3 vessel disease including a mid LAD occulusion with distal filling via the LIMA and proximal LAD collaterals to diagonal branches.  There RCA stent was patent with mild in stent restenosis.  High-sensitivity troponin is very mildly elevated.  Symptoms did improve with sublingual nitroglycerin.  Given her extensive cardiac history, plan for left heart cath today.  We added Imdur and continued her home aspirin, clopidogrel, metoprolol, rosuvastatin, and fenofibrate.  She is on Repatha at home.  Yesterday she noted that she only wanted a femoral cath because of bilateral hand neuropathic pain.  We discussed the fact that this would not have come from her prior radial caths.  Given her body habitus, I do think that a radial approach would be preferable.  She is going to think about this and will discuss with the person who will do the case.  # Chronic systolic and diastolic heart failure:  # AKI: LVEF 40 to 5 to 50% on cath 12/2019.  She is euvolemic.  Continue metoprolol.  Her home losartan and lasix are on hold due to AKI.  Renal function is improving.   # VT: Continue amiodarone and metoprolol.  She has an ICD.      For questions or updates, please contact [place] Please consult [email or web address] for contact info under        Signed, Skeet Latch, MD  07/21/2021, 12:51 PM

## 2021-07-21 NOTE — Interval H&P Note (Signed)
History and Physical Interval Note:  07/21/2021 2:07 PM  Jasmine Richardson  has presented today for surgery, with the diagnosis of unstable angina.  The various methods of treatment have been discussed with the patient and family. After consideration of risks, benefits and other options for treatment, the patient has consented to  Procedure(s): LEFT HEART CATH AND CORS/GRAFTS ANGIOGRAPHY (N/A) as a surgical intervention.  The patient's history has been reviewed, patient examined, no change in status, stable for surgery.  I have reviewed the patient's chart and labs.  Questions were answered to the patient's satisfaction.    Cath Lab Visit (complete for each Cath Lab visit)  Clinical Evaluation Leading to the Procedure:   ACS: Yes.    Non-ACS:  N/A  Dorean Daniello

## 2021-07-21 NOTE — Plan of Care (Signed)

## 2021-07-21 NOTE — Progress Notes (Addendum)
Inpatient Diabetes Program Recommendations  AACE/ADA: New Consensus Statement on Inpatient Glycemic Control   Target Ranges:  Prepandial:   less than 140 mg/dL      Peak postprandial:   less than 180 mg/dL (1-2 hours)      Critically ill patients:  140 - 180 mg/dL   Results for Jasmine Richardson, Jasmine Richardson (MRN [id number]) as of 07/21/2021 09:56  Ref. Range 07/20/2021 16:30 07/20/2021 21:18 07/21/2021 06:10  Glucose-Capillary Latest Ref Range: 70 - 99 mg/dL 344 (H) 315 (H) 281 (H)  Results for Jasmine Richardson, Jasmine Richardson (MRN [id number]) as of 07/21/2021 09:56  Ref. Range 07/20/2021 01:04  Glucose Latest Ref Range: 70 - 99 mg/dL 489 (H)  Hemoglobin A1C Latest Ref Range: 4.8 - 5.6 % 12.0 (H)    Review of Glycemic Control  Diabetes history: DM2 Outpatient Diabetes medications: Glipizide 10 mg BID, Metformin 500 mg BID, Tresiba 45 units daily Current orders for Inpatient glycemic control: Semglee 15 units QHS, Novolog 0-20 units TID with meals  Inpatient Diabetes Program Recommendations:    Insulin: Please consider increasing Semglee to 20 units QHS, adding Novolog 0-5 units QHS, and when diet resumed please consider ordering Novolog 4 units TID with meals for meal coverage if patient eats at least 50% of meals.  HbgA1C:  A1C 12% on 07/20/21 indicating an average glucose of 298 mg/dl over the past 2-3 months.  Addendum 8/5/223'@12'$ :09-Spoke with patient about diabetes and home regimen for diabetes control. Patient reports being followed by PCP for diabetes management and currently taking Tresiba 45 units QAM, Glipizide 10 mg BID, Metformin 500 mg BID as an outpatient for diabetes control. Patient reports taking DM medications as prescribed.  Patient reports checking glucose 3-4 times per day and that it is usually in the 200's-300's mg/dl (sometimes even higher).  Discussed A1C results (12% on 07/20/21) and explained that current A1C indicates an average glucose of 298 mg/dl over the past 2-3 months. Discussed glucose and A1C goals.  Discussed importance of checking CBGs and maintaining good CBG control to prevent long-term and short-term complications. Explained how hyperglycemia leads to damage within blood vessels which lead to the common complications seen with uncontrolled diabetes. Stressed to the patient the importance of improving glycemic control to prevent further complications from uncontrolled diabetes. Patient states that her PCP had told her to increase the dose of Tresiba by 1 units every day if fasting glucose was high and she had gotten up to 50 units and she thought that was too much insulin so she went back to 45 units QAM. She denies any issues with hypoglycemia on increased dose.  Explained that if fasting glucose is consistently in the 200's or higher then she likely needs more insulin that she is taking. Patient also notes that her PCP wants her to keep a written log of glucose fasting, before each meal and 2 hours after each meal, when she is eating, and what she is eating but she does not want to stick herself that much and does not think she has time to keep a detailed record like that.  Inquired about ever seeing an Endocrinologist and she states that her PCP referred her to an Endocrinologist over a year ago and her copay was going to be $600 so she did not see the Endocrinologist. Patient states she has been out of work for 3 years and her husband is the only one working in the home so she can not afford to pay high copays to see providers or for medicine.  Asked patient to call her insurance and see which local Endocrinologist are in network and inquire about what her copay would be.  Discussed [employer] and how it works. Encouraged patient to ask her insurance about copays and if affordable to ask PCP about prescribing it for her if she is interested. Encouraged patient to work with her providers to get DM under better control and decrease risk of complications. Patient states she has all needed testing  supplies and DM medications at home for DM management.   Patient verbalized understanding of information discussed and reports no further questions at this time related to diabetes.  Thanks, Barnie Alderman, RN, MSN, CDE Diabetes Coordinator Inpatient Diabetes Program [phone] (Team Pager from 8am to 5pm)

## 2021-07-21 NOTE — Significant Event (Signed)
Hep drip stopped upon Cath lab transporters arrival Tele monitor off CCMD aware.

## 2021-07-21 NOTE — Progress Notes (Addendum)
Subjective: I seen and evaluated Ms. Jasmine Richardson at bedside.  She was lying comfortably.  She is currently NPO.  She denies any lightheadedness, dizziness or changes in her vision.  Denies chest pain, palpitations or shortness of breath.  She is aware that she is awaiting her catheterization by cardiology.  Objective:  Vital signs in last 24 hours: Vitals:   07/20/21 2114 07/20/21 2116 07/21/21 0040 07/21/21 0431  BP:  (!) 142/84 (!) 118/57 135/69  Pulse:  74 67 63  Resp:  '18 17 15  '$ Temp:  98.1 F (36.7 C) 98 F (36.7 C) 97.9 F (36.6 C)  TempSrc:  Oral Oral Oral  SpO2:  98% 98% 96%  Weight: 112.9 kg   112.9 kg  Height: '5\' 7"'$  (1.702 m)      Physical Exam Constitutional:      Appearance: She is overweight.  HENT:     Head: Normocephalic and atraumatic.  Cardiovascular:     Rate and Rhythm: Normal rate.     Heart sounds: Murmur heard.  Systolic murmur is present with a grade of 3/6.  Pulmonary:     Effort: Pulmonary effort is normal.     Breath sounds: Normal breath sounds.  Musculoskeletal:     Right lower leg: No edema.     Left lower leg: No edema.  Skin:    General: Skin is warm and dry.  Neurological:     General: No focal deficit present.     Mental Status: She is alert.  Psychiatric:        Attention and Perception: Attention normal.        Behavior: Behavior normal. Behavior is cooperative.     Assessment/Plan:  Active Problems:   Unstable angina (HCC)  NSTEMI # Hx of Severe CAD s/p CABG, multiple DES Per patient's history, it seems she initially was experiencing stable angina with pain only with exertion that has now progressed to an unstable state.  Troponins were initially negative with a flat rise from 9 to 18.  EKG is unchanged.  Chest pain is currently resolved after administration of nitroglycerin. Lipid panel obtained and LDL within goal at less than 70.  Cardiology has evaluated patient and feel this is most consistent with NSTEMI.  Heparin drip  initiated on 1300 units/hr currently.  Troponin's trending down 33>>27>>22. Heparin unfractionated level below reference range at .22 (pending recheck) -- Pending cardiac catherization results - Cardiology consulted; appreciate their recommendations - Restart home aspirin and Plavix - Restart home rosuvastatin - Restart home metoprolol   # Hypertension  Initially quite hypertensive on admission that has since improved.  Patient confirms that she is taking metoprolol as prescribed but cannot verify valsartan use.  We will hold valsartan for now given jump in creatinine and plans for catheterization. - home metoprolol on board - Blood pressure is improved today.    # Chronic Kidney Disease Patient's baseline creatinine approximately 0.8 with a steady increase over the last 6 months.  Creatinine on admission elevated at 1.48.  Etiology undetermined at this time, however I suspect secondary to diabetes and/or hypertension.  May benefit from outpatient follow-up with nephrology. Renal US revealed No hydronephrosis; 3.6cm simple appearing cyst on left kidney --Creatinine level trending down 1.48>>1.22 ; will repeat BMP  - Hold valsartan - Protein/creatinine ratio pending - Microalbumin/creatinine ratio pending  # Type 2 Diabetes Mellitus Uncontrolled with last A1c of 11.2%.  Current home regimen includes Tresiba, glipizide and metformin.  Current(07/20/21) A1c at 12%. Glucose  range 280-315. Consider increasing Lantus to 30 units at bedtime if glucose readings don't improve throughout the day. - Lantus 15 units at bedtime - SSI, resistant scale   # Hyperlipidemia Patient has a history of profound hyperlipidemia currently being managed with maximum dose statin and Repatha.  Lipid panel was reevaluated on admission and shows LDL has improved to <70. Lipid profile: Cholesterol- 148; LDL- 56; triglycerides- 186 - Continue rosuvastatin   # Ventricular Tachycardia s/p ICD - Restart home  amiodarone --Continue telemetry    # Hypercalcemia  Mild but persistent over the last several months. In the ED, labs results revealed Calcium level at 10.4. Currently WNL at 9.6 on repeat BMP this morning. - PTH pending  Prior to Admission Living Arrangement: Anticipated Discharge Location: Barriers to Discharge: Dispo: Anticipated discharge in approximately 2-3 day(s).   Timothy Lasso, MD 07/21/2021, 6:04 AM Pager: [phone] After 5pm on weekdays and 1pm on weekends: On Call pager [phone]

## 2021-07-21 NOTE — Progress Notes (Signed)
Order for sheath removal verified per post procedural orders. Procedure explained to patient and right femoral artery access site assessed:  level 0,  palpable dorsalis pedis  5 French Sheath removed and manual pressure applied for 25 minutes.  Pre, peri, & post procedural vitals: HR 57, RR 19, O2 Sat 94%, BP 140/63, Pain level 0.  Distal pulses remained intact after sheath removal.  Access site level 0 and dressed with 4X4 gauze and tegaderm.   Post procedural instructions discussed with return demonstration from patient.

## 2021-07-22 DIAGNOSIS — I214 Non-ST elevation (NSTEMI) myocardial infarction: Secondary | ICD-10-CM | POA: Diagnosis not present

## 2021-07-22 LAB — CBC
HCT: 32.3 % — ABNORMAL LOW (ref 36.0–46.0)
Hemoglobin: 10.5 g/dL — ABNORMAL LOW (ref 12.0–15.0)
MCH: 29.3 pg (ref 26.0–34.0)
MCHC: 32.5 g/dL (ref 30.0–36.0)
MCV: 90.2 fL (ref 80.0–100.0)
Platelets: 225 10*3/uL (ref 150–400)
RBC: 3.58 MIL/uL — ABNORMAL LOW (ref 3.87–5.11)
RDW: 13.4 % (ref 11.5–15.5)
WBC: 7 10*3/uL (ref 4.0–10.5)
nRBC: 0 % (ref 0.0–0.2)

## 2021-07-22 LAB — BASIC METABOLIC PANEL
Anion gap: 6 (ref 5–15)
BUN: 15 mg/dL (ref 6–20)
CO2: 23 mmol/L (ref 22–32)
Calcium: 9 mg/dL (ref 8.9–10.3)
Chloride: 107 mmol/L (ref 98–111)
Creatinine, Ser: 1.09 mg/dL — ABNORMAL HIGH (ref 0.44–1.00)
GFR, Estimated: 58 mL/min — ABNORMAL LOW (ref >60)
Glucose, Bld: 296 mg/dL — ABNORMAL HIGH (ref 70–99)
Potassium: 4.7 mmol/L (ref 3.5–5.1)
Sodium: 136 mmol/L (ref 135–145)

## 2021-07-22 LAB — PARATHYROID HORMONE, INTACT (NO CA): PTH: 16 pg/mL (ref 15–65)

## 2021-07-22 LAB — GLUCOSE, CAPILLARY: Glucose-Capillary: 270 mg/dL — ABNORMAL HIGH (ref 70–99)

## 2021-07-22 LAB — MICROALBUMIN / CREATININE URINE RATIO
Creatinine, Urine: 113.9 mg/dL
Microalb Creat Ratio: 258 mg/g creat — ABNORMAL HIGH (ref 0–29)
Microalb, Ur: 293.5 ug/mL — ABNORMAL HIGH

## 2021-07-22 MED ORDER — TRESIBA FLEXTOUCH 100 UNIT/ML ~~LOC~~ SOPN: 45.0000 [IU] | PEN_INJECTOR | Freq: Every day | SUBCUTANEOUS | 1 refills | Status: DC

## 2021-07-22 MED ORDER — FENOFIBRATE 145 MG PO TABS: 145.0000 mg | ORAL_TABLET | Freq: Every day | ORAL | 0 refills | Status: DC

## 2021-07-22 MED ORDER — ROSUVASTATIN CALCIUM 20 MG PO TABS: 20.0000 mg | ORAL_TABLET | Freq: Every day | ORAL | 0 refills | Status: DC

## 2021-07-22 MED ORDER — NITROGLYCERIN 0.4 MG SL SUBL: 0.4000 mg | SUBLINGUAL_TABLET | SUBLINGUAL | 12 refills | Status: DC | PRN

## 2021-07-22 MED ORDER — ISOSORBIDE MONONITRATE ER 30 MG PO TB24: 30.0000 mg | ORAL_TABLET | Freq: Every day | ORAL | 0 refills | Status: DC

## 2021-07-22 MED ORDER — ASPIRIN EC 81 MG PO TBEC: 81.0000 mg | DELAYED_RELEASE_TABLET | Freq: Every day | ORAL | 0 refills | Status: AC

## 2021-07-22 MED ORDER — CLOPIDOGREL BISULFATE 75 MG PO TABS: 75.0000 mg | ORAL_TABLET | Freq: Every day | ORAL | 0 refills | Status: AC

## 2021-07-22 MED ORDER — METFORMIN HCL 500 MG PO TABS: 500.0000 mg | ORAL_TABLET | Freq: Two times a day (BID) | ORAL | 0 refills | Status: DC

## 2021-07-22 MED ORDER — METOPROLOL SUCCINATE ER 25 MG PO TB24: 25.0000 mg | ORAL_TABLET | Freq: Three times a day (TID) | ORAL | 0 refills | Status: DC

## 2021-07-22 MED ORDER — DAPAGLIFLOZIN PROPANEDIOL 10 MG PO TABS: 10.0000 mg | ORAL_TABLET | Freq: Every day | ORAL | 0 refills | Status: AC

## 2021-07-22 MED ORDER — AMIODARONE HCL 200 MG PO TABS
200.0000 mg | ORAL_TABLET | Freq: Every day | ORAL | 0 refills | Status: DC
Start: 2021-07-22 — End: 2022-01-16

## 2021-07-22 MED ORDER — METOPROLOL SUCCINATE ER 50 MG PO TB24: 50.0000 mg | ORAL_TABLET | Freq: Three times a day (TID) | ORAL | 0 refills | Status: DC

## 2021-07-22 MED ORDER — REPATHA SURECLICK 140 MG/ML ~~LOC~~ SOAJ: 140.0000 mg | SUBCUTANEOUS | 0 refills | Status: DC

## 2021-07-22 MED ORDER — OMEPRAZOLE 20 MG PO CPDR: 20.0000 mg | DELAYED_RELEASE_CAPSULE | Freq: Every day | ORAL | 0 refills | Status: DC

## 2021-07-22 MED ORDER — FUROSEMIDE 40 MG PO TABS: 40.0000 mg | ORAL_TABLET | Freq: Every day | ORAL | 0 refills | Status: DC

## 2021-07-22 NOTE — TOC Transition Note (Signed)
Transition of Care [facility]) - CM/SW Discharge Note   Patient Details  Name: Jasmine Richardson MRN: [id number] Date of Birth: 11/05/1961  [facility] CM/SW Contact:  Konrad Penta, RN Phone Number: [phone] 07/22/2021, 10:58 AM   Clinical Narrative:   Received consult for medication assistance. Spoke with patient who confirms she has insurance but she is not working at the time. Discussed that writer unable to assist with [place] letter due to having insurance.  Writer printed out several discount cards from [employer] for patient to take to her pharmacy. Also encouraged patient to inquire about medication assistance programs with her [place].Also provided [place] discount card.  No further needs assessed.     Final next level of care: Home/Self Care Barriers to Discharge: No Barriers Identified   Patient Goals and CMS Choice Patient states their goals for this hospitalization and ongoing recovery are:: return home      Discharge Placement               Discharge Plan and Services                Social Determinants of Health (SDOH) Interventions     Readmission Risk Interventions No flowsheet data found.

## 2021-07-22 NOTE — Progress Notes (Signed)
CARDIAC REHAB PHASE I   PRE:  Rate/Rhythm: 59 SB  BP:  Supine:   Sitting: 159/56  Standing:    SaO2: 96  MODE:  Ambulation: 400 ft   POST:  Rate/Rhythm: 56 SB  BP:  Supine:   Sitting: 148/81  Standing:    SaO2: 98 Pt tolerated exercise well and AMB 400 ft with standby assist and no assistive device. Pt had no SOB, CP or pain. Groin site looked intact. Education given to pt on on MI booklet, adherence to Plavix, Asprin and NTG. Reinforced education on home exercise, femoral restrictions, heart heathy diet and diabetic diet. Will refer to [facility] program in [place], [place]. P{t has been through the program before and really enjoyed it. Pt left in bed with call bell in reach. All questions were answered.   Kirby Funk ACSM-EP 07/22/2021 8:56 AM

## 2021-07-22 NOTE — Discharge Summary (Addendum)
Name: Jasmine Richardson MRN: [id number] DOB: [birth year] 60 y.o. PCP: Nicholos Johns, MD  Date of Admission: 07/20/2021 12:48 AM Date of Discharge: 07/22/2021 Attending Physician: Lalla Brothers, MD  Discharge Diagnosis: 1.  Myocardial infarction with nonobstructive coronary arteries 2.Chronic Kidney Disease 3.Type 2 Diabetes Mellitus 4.Ventricular Tachycardia s/p ICD  Discharge Medications: Allergies as of 07/22/2021       Reactions   Chlorhexidine Gluconate Itching   Received CHG bath, began itching, required benadryl   Cat Hair Extract Other (See Comments)   Sneezing, watery eyes.   Tramadol Other (See Comments)   Hallucinations   Codeine Rash        Medication List     STOP taking these medications    glipiZIDE 10 MG 24 hr tablet Commonly known as: GLUCOTROL XL   LORazepam 0.5 MG tablet Commonly known as: ATIVAN   VITAMIN B 12 PO       TAKE these medications    amiodarone 200 MG tablet Commonly known as: PACERONE Take 1 tablet (200 mg total) by mouth daily.   aspirin EC 81 MG tablet Take 1 tablet (81 mg total) by mouth daily.   clopidogrel 75 MG tablet Commonly known as: PLAVIX Take 1 tablet (75 mg total) by mouth daily.   dapagliflozin propanediol 10 MG Tabs tablet Commonly known as: Farxiga Take 1 tablet (10 mg total) by mouth daily before breakfast.   fenofibrate 145 MG tablet Commonly known as: Tricor Take 1 tablet (145 mg total) by mouth daily.   furosemide 40 MG tablet Commonly known as: LASIX Take 1 tablet (40 mg total) by mouth daily.   isosorbide mononitrate 30 MG 24 hr tablet Commonly known as: IMDUR Take 1 tablet (30 mg total) by mouth daily.   metFORMIN 500 MG tablet Commonly known as: GLUCOPHAGE Take 1 tablet (500 mg total) by mouth 2 (two) times daily.   metoprolol succinate 25 MG 24 hr tablet Commonly known as: Toprol XL Take 1 tablet (25 mg total) by mouth 3 (three) times daily. What changed:  medication strength how much to  take   nitroGLYCERIN 0.4 MG SL tablet Commonly known as: NITROSTAT Place 1 tablet (0.4 mg total) under the tongue every 5 (five) minutes as needed for chest pain. What changed: See the new instructions.   nystatin powder Commonly known as: MYCOSTATIN/NYSTOP Apply 1 application topically daily.   omeprazole 20 MG capsule Commonly known as: PRILOSEC Take 1 capsule (20 mg total) by mouth daily. What changed: Another medication with the same name was removed. Continue taking this medication, and follow the directions you see here.   Repatha SureClick [id number] MG/ML Soaj Generic drug: Evolocumab Inject 140 mg into the skin every 28 (twenty-eight) days.   rosuvastatin 20 MG tablet Commonly known as: CRESTOR Take 1 tablet (20 mg total) by mouth at bedtime.   Tyler Aas FlexTouch 100 UNIT/ML FlexTouch Pen Generic drug: insulin degludec Inject 45 Units into the skin daily.   valsartan 80 MG tablet Commonly known as: Diovan Take 1 tablet (80 mg total) by mouth 2 (two) times daily.   vitamin C 250 MG tablet Commonly known as: ASCORBIC ACID Take 250 mg by mouth at bedtime.   Vitamin D (Ergocalciferol) 1.25 MG (50000 UNIT) Caps capsule Commonly known as: DRISDOL Take 1 capsule by mouth once a week.        Disposition and follow-up:   JasmineShambria Richardson was discharged from [facility] in Good condition.  At the hospital follow up visit  please address:  1.  Type 2 Diabetes Mellitus Uncontrolled with current A1c 12% on lab work during hospitalization..  Home regimen included Tresiba, glipizide and metformin.  During hospital course her glucose ranges from low 200s to 300s.  She was given Lantus 20 units at bedtime and sliding scale insulin, resistance scale.  Her glipizide was DC'd and she was started on Farxiga 20 mg daily.  Needs tighter glucose control. 2.      MINOCA Per patient history, she was experiencing stable angina with pain only with exertion, that progressed to  unstable state. Troponins gradually increased from 9-18 warranting cardiology consult resulting in catheterization. Catheterization revealed no new occlusion of cardiac vessels.  Increase antianginal medication.  2.  Labs / imaging needed at time of follow-up: BMP  3.  Pending labs/ test needing follow-up: None  Follow-up Appointments:  Follow-up Information     Nicholos Johns, MD .   Specialty: Internal Medicine Contact information: [address] [place] [address] [phone]         Nicholos Johns, MD .   Specialty: Internal Medicine Contact information: [address] [place] [address] [phone]         Richardo Priest, MD .   Specialties: Cardiology, Radiology Why: Office will call you with a follow up in the next 2-3 business days Contact information: [place] [address] [phone]                 Hospital Course by problem list: 1.  Ms. Jasmine Richardson to the ED with worsening chest pain.  She stated that for the past week the chest pain initially occurred on exertion and nitroglycerin improves the pain.  However the chest pain became more persistent and gradually worsened even at rest.  In the ED she was found to be hypertensive at 180/94 with a heart rate of 85.  Initial lab work demonstrated troponin of 9 that increased to 18.  CMP showed marked hyperglycemia with elevation in creatinine to 1.48.  Chest x-ray demonstrated no active cardiopulmonary disease.  Cardiology was consulted and catheterization performed.  Catheterization results showed no new occlusions of of cardiac vessels.  Ms. Jasmine Richardson were restarted during hospitalization and she denied any recurrence of chest pain. Further lab lab work and vitals were reassuring.  Discharge Exam:   BP 135/61 (BP Location: Left Arm)   Pulse (!) 42   Temp 98.4 F (36.9 C) (Oral)   Resp 18   Ht '5\' 7"'$  (1.702 m)   Wt 113.9 kg   LMP  (LMP Unknown)    SpO2 97%   BMI 39.31 kg/m  Discharge exam: Physical Exam HENT:     Head: Normocephalic and atraumatic.  Cardiovascular:     Rate and Rhythm: Normal rate.     Heart sounds: Normal heart sounds.     Comments: 4 inch Linear Scar present on sternum  Pulmonary:     Effort: Pulmonary effort is normal.     Breath sounds: Normal breath sounds.  Musculoskeletal:     Right lower leg: No edema.     Left lower leg: No edema.  Skin:    General: Skin is warm and dry.  Neurological:     General: No focal deficit present.     Mental Status: She is alert.  Psychiatric:        Behavior: Behavior normal. Behavior is cooperative.     Pertinent Labs, Studies, and  Procedures:  CBC Latest Ref Rng & Units 07/22/2021 07/21/2021 07/20/2021  WBC 4.0 - 10.5 K/uL 7.0 7.1 7.0  Hemoglobin 12.0 - 15.0 g/dL 10.5(L) 10.4(L) 11.2(L)  Hematocrit 36.0 - 46.0 % 32.3(L) 32.1(L) 34.8(L)  Platelets 150 - 400 K/uL 225 229 260   CMP Latest Ref Rng & Units 07/22/2021 07/21/2021 07/20/2021  Glucose 70 - 99 mg/dL 296(H) 319(H) 489(H)  BUN 6 - 20 mg/dL '15 19 19  '$ Creatinine 0.44 - 1.00 mg/dL 1.09(H) 1.22(H) 1.48(H)  Sodium 135 - 145 mmol/L 136 136 136  Potassium 3.5 - 5.1 mmol/L 4.7 4.1 5.1  Chloride 98 - 111 mmol/L 107 103 101  CO2 22 - 32 mmol/L '23 25 25  '$ Calcium 8.9 - 10.3 mg/dL 9.0 9.6 10.4(H)  Total Protein 6.5 - 8.1 g/dL - - 7.3  Total Bilirubin 0.3 - 1.2 mg/dL - - 0.6  Alkaline Phos 38 - 126 U/L - - 40  AST 15 - 41 U/L - - 39  ALT 0 - 44 U/L - - 34   BMP Latest Ref Rng & Units 07/22/2021 07/21/2021 07/20/2021  Glucose 70 - 99 mg/dL 296(H) 319(H) 489(H)  BUN 6 - 20 mg/dL '15 19 19  '$ Creatinine 0.44 - 1.00 mg/dL 1.09(H) 1.22(H) 1.48(H)  BUN/Creat Ratio 12 - 28 - - -  Sodium 135 - 145 mmol/L 136 136 136  Potassium 3.5 - 5.1 mmol/L 4.7 4.1 5.1  Chloride 98 - 111 mmol/L 107 103 101  CO2 22 - 32 mmol/L '23 25 25  '$ Calcium 8.9 - 10.3 mg/dL 9.0 9.6 10.4(H)   Troponins 33>>27>>22   Discharge Instructions: Discharge  Instructions     Amb Referral to Cardiac Rehabilitation   Complete by: As directed    CRP2 program in  [place]   Diagnosis: NSTEMI   After initial evaluation and assessments completed: Virtual Based Care may be provided alone or in conjunction with Phase 2 Cardiac Rehab based on patient barriers.: Yes   Call MD for:  difficulty breathing, headache or visual disturbances   Complete by: As directed    Call MD for:  extreme fatigue   Complete by: As directed    Call MD for:  hives   Complete by: As directed    Call MD for:  persistant dizziness or light-headedness   Complete by: As directed    Call MD for:  persistant nausea and vomiting   Complete by: As directed    Call MD for:  redness, tenderness, or signs of infection (pain, swelling, redness, odor or green/yellow discharge around incision site)   Complete by: As directed    Call MD for:  severe uncontrolled pain   Complete by: As directed    Call MD for:  temperature >100.4   Complete by: As directed    Diet - low sodium heart healthy   Complete by: As directed    Discharge instructions   Complete by: As directed    Stop taking glipizide  Start taking Farxiga '10mg'$  once a day. Prescription has been sent to your pharmacy  Be sure to go to your appointment on Wednesday with your primary care doctor   Increase activity slowly   Complete by: As directed        Signed: Timothy Lasso, MD 07/22/2021, 12:59 PM   Pager: [phone]

## 2021-07-22 NOTE — Progress Notes (Signed)
Progress Note  Patient Name: Jasmine Richardson Date of Encounter: 07/22/2021  Primary Cardiologist:   Shirlee More, MD   Subjective   No chest pain.  No palpitations.    Inpatient Medications    Scheduled Meds:  amiodarone  200 mg Oral Daily   aspirin EC  81 mg Oral Daily   clopidogrel  75 mg Oral Daily   enoxaparin (LOVENOX) injection  40 mg Subcutaneous Q24H   insulin aspart  0-20 Units Subcutaneous TID WC   insulin glargine-yfgn  20 Units Subcutaneous QHS   isosorbide mononitrate  30 mg Oral Daily   metoprolol succinate  50 mg Oral TID   pantoprazole  40 mg Oral Daily   rosuvastatin  20 mg Oral QHS   sodium chloride flush  3 mL Intravenous Q12H   sodium chloride flush  3 mL Intravenous Q12H   Continuous Infusions:  sodium chloride     PRN Meds: sodium chloride, acetaminophen **OR** acetaminophen, nitroGLYCERIN, sodium chloride flush   Vital Signs    Vitals:   07/21/21 2010 07/22/21 0019 07/22/21 0407 07/22/21 0800  BP: 138/76 (!) 150/66 (!) 161/70 135/61  Pulse: 61 61 (!) 43 (!) 42  Resp: '17 17 17 18  '$ Temp: (!) 97.4 F (36.3 C) 97.6 F (36.4 C) (!) 97.5 F (36.4 C) 98.4 F (36.9 C)  TempSrc: Oral Oral Oral Oral  SpO2: 98% 96% 99% 97%  Weight:   113.9 kg   Height:        Intake/Output Summary (Last 24 hours) at 07/22/2021 1000 Last data filed at 07/22/2021 0802 Gross per 24 hour  Intake 750.93 ml  Output 600 ml  Net 150.93 ml   Filed Weights   07/20/21 2114 07/21/21 0431 07/22/21 0407  Weight: 112.9 kg 112.9 kg 113.9 kg    Telemetry    NSR, paced.  No VT - Personally Reviewed  ECG    NA - Personally Reviewed  Physical Exam   GEN: No acute distress.   Neck: No  JVD Cardiac: RRR, no murmurs, rubs, or gallops.  Respiratory: Clear  to auscultation bilaterally. GI: Soft, nontender, non-distended  MS: No  edema; No deformity.  Right femoral access site without bleeding or bruising Neuro:  Nonfocal  Psych: Normal affect   Labs     Chemistry Recent Labs  Lab 07/20/21 0104 07/21/21 0356 07/22/21 0439  NA 136 136 136  K 5.1 4.1 4.7  CL 101 103 107  CO2 '25 25 23  '$ GLUCOSE 489* 319* 296*  BUN '19 19 15  '$ CREATININE 1.48* 1.22* 1.09*  CALCIUM 10.4* 9.6 9.0  PROT 7.3  --   --   ALBUMIN 3.7  --   --   AST 39  --   --   ALT 34  --   --   ALKPHOS 40  --   --   BILITOT 0.6  --   --   GFRNONAA 40* 51* 58*  ANIONGAP '10 8 6     '$ Hematology Recent Labs  Lab 07/20/21 0104 07/21/21 0356 07/22/21 0439  WBC 7.0 7.1 7.0  RBC 3.88 3.60* 3.58*  HGB 11.2* 10.4* 10.5*  HCT 34.8* 32.1* 32.3*  MCV 89.7 89.2 90.2  MCH 28.9 28.9 29.3  MCHC 32.2 32.4 32.5  RDW 13.5 13.3 13.4  PLT 260 229 225    Cardiac EnzymesNo results for input(s): TROPONINI in the last 168 hours. No results for input(s): TROPIPOC in the last 168 hours.   BNP Recent Labs  Lab 07/20/21 0104  BNP 86.1     DDimer No results for input(s): DDIMER in the last 168 hours.   Radiology    CARDIAC CATHETERIZATION  Result Date: 07/21/2021 Conclusions: Severe native coronary artery disease, as detailed below, not significantly changed from prior catheterization in 12/2020. Widely patent LIMA-LAD graft and LMCA stent. Patent mid RCA stents with moderate restenosis (~40%). Moderately reduced left ventricular systolic function (LVEF A999333). Upper normal left ventricular filling pressure (LVEDP 15 mmHg). Recommendations: Escalate antianginal therapy; no focal targets again noted on today's catheterization. Aggressive secondary prevention. Nelva Bush, MD [facility]  US RENAL  Result Date: 07/20/2021 CLINICAL DATA:  Chronic kidney disease EXAM: RENAL / URINARY TRACT ULTRASOUND COMPLETE COMPARISON:  None. FINDINGS: Right Kidney: Renal measurements: 12.1 x 5.7 x 6.0 cm = volume: 219.26 mL. Echogenicity within normal limits. No mass or hydronephrosis visualized. Left Kidney: Renal measurements: 11.8 x 5.4 x 5.0 cm = volume: 181.49 mL. Echogenicity is within  normal limits. Simple appearing left renal cyst measuring 3.6 x 2.7 x 3.6 cm. Bladder: Mildly distended but unremarkable. Other: Partially visualized liver parenchyma which appears diffusely increased in echogenicity. IMPRESSION: No hydronephrosis. 3.6 cm simple appearing left renal cyst. Electronically Signed   By: Maurine Simmering   On: 07/20/2021 18:33    Cardiac Studies   Cath   Severe native coronary artery disease, as detailed below, not significantly changed from prior catheterization in 12/2020. Widely patent LIMA-LAD graft and LMCA stent. Patent mid RCA stents with moderate restenosis (~40%). Moderately reduced left ventricular systolic function (LVEF A999333). Upper normal left ventricular filling pressure (LVEDP 15 mmHg).    Diagnostic Dominance: Right    Intervention   Patient Profile     60 y.o. female with CAD s/p CABG and PCI, VT s/p ICD, chronic systolic and diastolic heart failure, hypertension, hyperlipidemia, diabetes, CKD III admitted with chest pain and mildly elevated hs-troponin.   Assessment & Plan    CHEST PAIN:  Cath results as above.   No significant change from previous.  Medical management.  No change in therapy.   We will arrange follow up with Dr. [name].  The office will call  CHRONIC SYSTOLIC AND DIASTOLIC HF:  Euvolemic.  No change in therapy.    VT:  ICD in place.  Continue beta blocker and amiodarone.   She will call to arrange device follow up.   For questions or updates, please contact [place] Please consult [email or web address] for contact info under Cardiology/STEMI.   Signed, Minus Breeding, MD  07/22/2021, 10:00 AM

## 2021-07-24 ENCOUNTER — Telehealth (HOSPITAL_COMMUNITY): Payer: Self-pay

## 2021-07-24 ENCOUNTER — Encounter (HOSPITAL_COMMUNITY): Payer: Self-pay | Admitting: Internal Medicine

## 2021-07-24 NOTE — Telephone Encounter (Signed)
Per phase I cardiac rehab, fax cardiac rehab referral to [facility]. °

## 2021-08-22 ENCOUNTER — Other Ambulatory Visit: Payer: Managed Care, Other (non HMO)

## 2021-08-23 ENCOUNTER — Other Ambulatory Visit: Payer: Self-pay | Admitting: Pharmacist

## 2021-08-23 DIAGNOSIS — I214 Non-ST elevation (NSTEMI) myocardial infarction: Secondary | ICD-10-CM

## 2021-08-23 DIAGNOSIS — E782 Mixed hyperlipidemia: Secondary | ICD-10-CM

## 2021-08-23 DIAGNOSIS — I25119 Atherosclerotic heart disease of native coronary artery with unspecified angina pectoris: Secondary | ICD-10-CM

## 2021-08-23 DIAGNOSIS — Z951 Presence of aortocoronary bypass graft: Secondary | ICD-10-CM

## 2021-08-23 MED ORDER — REPATHA SURECLICK 140 MG/ML ~~LOC~~ SOAJ: 140.0000 mg | SUBCUTANEOUS | 11 refills | Status: DC

## 2021-08-29 ENCOUNTER — Encounter: Payer: Self-pay | Admitting: Cardiology

## 2021-08-29 ENCOUNTER — Other Ambulatory Visit: Payer: Self-pay

## 2021-08-29 ENCOUNTER — Ambulatory Visit (INDEPENDENT_AMBULATORY_CARE_PROVIDER_SITE_OTHER): Payer: Managed Care, Other (non HMO) | Admitting: Cardiology

## 2021-08-29 VITALS — BP 122/70 | HR 70 | Ht 67.0 in | Wt 247.2 lb

## 2021-08-29 DIAGNOSIS — I472 Ventricular tachycardia, unspecified: Secondary | ICD-10-CM

## 2021-08-29 DIAGNOSIS — E1165 Type 2 diabetes mellitus with hyperglycemia: Secondary | ICD-10-CM

## 2021-08-29 DIAGNOSIS — I25119 Atherosclerotic heart disease of native coronary artery with unspecified angina pectoris: Secondary | ICD-10-CM

## 2021-08-29 DIAGNOSIS — Z95810 Presence of automatic (implantable) cardiac defibrillator: Secondary | ICD-10-CM

## 2021-08-29 DIAGNOSIS — Z79899 Other long term (current) drug therapy: Secondary | ICD-10-CM | POA: Diagnosis not present

## 2021-08-29 DIAGNOSIS — I5032 Chronic diastolic (congestive) heart failure: Secondary | ICD-10-CM

## 2021-08-29 DIAGNOSIS — I110 Hypertensive heart disease with heart failure: Secondary | ICD-10-CM

## 2021-08-29 DIAGNOSIS — E782 Mixed hyperlipidemia: Secondary | ICD-10-CM

## 2021-08-29 NOTE — Patient Instructions (Signed)
Medication Instructions:  Your physician recommends that you continue on your current medications as directed. Please refer to the Current Medication list given to you today.  *If you need a refill on your cardiac medications before your next appointment, please call your pharmacy*   Lab Work: None If you have labs (blood work) drawn today and your tests are completely normal, you will receive your results only by: [place] (if you have MyChart) OR A paper copy in the mail If you have any lab test that is abnormal or we need to change your treatment, we will call you to review the results.   Testing/Procedures: NONE   Follow-Up: At [facility], you and your health needs are our priority.  As part of our continuing mission to provide you with exceptional heart care, we have created designated Provider Care Teams.  These Care Teams include your primary Cardiologist (physician) and Advanced Practice Providers (APPs -  Physician Assistants and Nurse Practitioners) who all work together to provide you with the care you need, when you need it.  We recommend signing up for the patient portal called "MyChart".  Sign up information is provided on this After Visit Summary.  MyChart is used to connect with patients for Virtual Visits (Telemedicine).  Patients are able to view lab/test results, encounter notes, upcoming appointments, etc.  Non-urgent messages can be sent to your provider as well.   To learn more about what you can do with MyChart, go to [email or web address].    Your next appointment:   6 month(s)  The format for your next appointment:   In Person  Provider:   Shirlee More, MD   Other Instructions

## 2021-08-29 NOTE — Progress Notes (Signed)
Cardiology Office Note:    Date:  08/29/2021   ID:  Jasmine Richardson, DOB 02/13/1961, MRN [id number]  PCP:  Nicholos Johns, MD  Cardiologist:  Shirlee More, MD    Referring MD: Nicholos Johns, MD    ASSESSMENT:    1. Coronary artery disease involving native coronary artery of native heart with angina pectoris ([place])   2. Ventricular tachycardia ([place])   3. On amiodarone therapy   4. ICD (implantable cardioverter-defibrillator) in place   5. Hypertensive heart disease with chronic diastolic congestive heart failure ([place])   6. Mixed hyperlipidemia   7. Uncontrolled type 2 diabetes mellitus with hyperglycemia (HCC)    PLAN:    In order of problems listed above:  Although the discharge diagnosis was myocardial infarction I disagree her high-sensitivity troponin was not in the diagnostic range greater than 100 and just slowly identifies a population at higher risk need further evaluation with coronary angiography.  Coronary anatomy was unchanged and clinical course subsequent has been stable presently having no angina after CABG highly complex PCI and she will continue long-term dual antiplatelet therapy oral nitrate beta-blocker high intensity statin. Stable no recurrence continue low-dose amiodarone MRI CTS following device clinic no evidence of amiodarone toxicity Stable no fluid overload continue loop diuretic and antihypertensive beta-blocker Lipids are at target continue high intensity PCSK9 inhibitor statin and fenofibrate Managed by her PCP In some fashion vitamin C became a concern in hospital she was told to discontinue come and speak to me and I told her I cannot think of a reason not to take it and she can resume   Next appointment: 6 months   Medication Adjustments/Labs and Tests Ordered: Current medicines are reviewed at length with the patient today.  Concerns regarding medicines are outlined above.  No orders of the defined types were placed in this encounter.  No orders of  the defined types were placed in this encounter.   Chief complaint Hospital follow-up   History of Present Illness:    Jasmine Richardson is a 60 y.o. female with a hx of CAD with CABG 2376 complicated by postoperative mediastinitis reoperation at [facility] with myocutaneous pectoralis flap closure ACS with PCI and stent native right coronary artery 05/25/2020 with a "inclusion of the vein graft to the right coronary artery and subsequent complex PCI and stent to the coronary artery with shockwave intravascular lithotripsy 06/22/2020 dissection conclusion small caliber first diagonal branch.  Other problems include ventricular tachycardia treated with IV amiodarone and lidocaine conversion to sinus rhythm subsequently loaded with amiodarone and had an ICD placed at [facility] drip at admission.  Other problems include diabetes mellitus hyperlipidemia hypertensive heart disease with chronic diastolic heart failure.  She was last seen by me 06/15/2021.  Most recent admitted to [facility] on 07/20/2021 with chest pain she underwent repeat coronary arteriography anatomy unchanged from the previous catheterization January 2022 left thoracic artery remains widely patent left anterior descending and stent left main coronary artery right coronary artery were widely patent with occlusion of the vein grafts to the right coronary artery left circumflex marginal and diagonal branch.  Ongoing medical therapy was recommended.  In hospital high-sensitivity troponin was elevated peak 33.  Cardiac Studies    Cath    Severe native coronary artery disease, as detailed below, not significantly changed from prior catheterization in 12/2020. Widely patent LIMA-LAD graft and LMCA stent. Patent mid RCA stents with moderate restenosis (~40%). Moderately reduced left ventricular systolic function (  LVEF 40-45%). Upper normal left ventricular filling pressure (LVEDP 15 mmHg).     Diagnostic Dominance:  Right    Compliance with diet, lifestyle and medications: Yes  She has an area of skin breakdown in the right breast?  Sterile abscess is being seen by surgery 12 and is pending breast MRI and follow-up. No further chest pain shortness of breath edema palpitation or syncope. Past Medical History:  Diagnosis Date   Acute chest pain 05/08/2016   Angina pectoris ([place]) 09/11/2017   Burping 05/08/2016   Chronic diastolic heart failure ([place]) 11/06/2016   Coronary artery disease involving native coronary artery of native heart with angina pectoris ([place]) 09/12/2015   Overview:  PCI and stent of RCA 2009, last cath 2012 with medical therapy  CABG May 2017   Emphysema lung ([place]) 09/11/2017   Essential hypertension 09/12/2015   GERD (gastroesophageal reflux disease) 05/08/2016   Hyperlipidemia 09/12/2015   Mediastinitis 06/28/2016   Morbid obesity ([place]) 05/08/2016   S/P CABG (coronary artery bypass graft) 06/03/2016   Overview:  The patient underwent sternal reconstruction on 06/21/16 with pec flaps for mediastinitis from a prior CABG in May 2017. On admission, she was critically ill from sepsis and had altered mental status. She was last seen in clinic on 08/09/16 at which time she was doing well.   Severe sepsis ([place]) 06/03/2016   Sinus tachycardia 05/08/2016   Tobacco use disorder 04/20/2016   Overview:  Quit in May 2017.   Uncontrolled type 2 diabetes mellitus ([place]) 05/08/2016   Wound, surgical, infected 06/06/2016   Overview:  sternal    Past Surgical History:  Procedure Laterality Date   AMPUTATION TOE Right 02/20/2020   Procedure: AMPUTATION RIGHT GREAT  TOE;  Surgeon: Felipa Furnace, DPM;  Location: [place];  Service: Podiatry;  Laterality: Right;   BLADDER SURGERY     CARDIAC CATHETERIZATION     CORONARY ARTERY BYPASS GRAFT     CORONARY STENT INTERVENTION N/A 05/25/2020   Procedure: CORONARY STENT INTERVENTION;  Surgeon: Wellington Hampshire, MD;  Location: [place] [facility];  Service: Cardiovascular;   Laterality: N/A;   CORONARY STENT INTERVENTION N/A 06/22/2020   Procedure: CORONARY STENT INTERVENTION;  Surgeon: Leonie Man, MD;  Location: [place] [facility];  Service: Cardiovascular;  Laterality: N/A;   ICD IMPLANT N/A 12/19/2020   Procedure: ICD IMPLANT;  Surgeon: Evans Lance, MD;  Location: [place] [facility];  Service: Cardiovascular;  Laterality: N/A;   INCISION AND DRAINAGE Right 02/19/2020   Procedure: INCISION AND DRAINAGE;  Surgeon: Trula Slade, DPM;  Location: [place];  Service: Podiatry;  Laterality: Right;  Block done by surgeon   INTRAVASCULAR ULTRASOUND/IVUS N/A 06/22/2020   Procedure: Intravascular Ultrasound/IVUS;  Surgeon: Leonie Man, MD;  Location: [place] [facility];  Service: Cardiovascular;  Laterality: N/A;   LEFT HEART CATH AND CORS/GRAFTS ANGIOGRAPHY N/A 05/23/2020   Procedure: LEFT HEART CATH AND CORS/GRAFTS ANGIOGRAPHY;  Surgeon: Nelva Bush, MD;  Location: [place] [facility];  Service: Cardiovascular;  Laterality: N/A;   LEFT HEART CATH AND CORS/GRAFTS ANGIOGRAPHY N/A 12/19/2020   Procedure: LEFT HEART CATH AND CORS/GRAFTS ANGIOGRAPHY;  Surgeon: Burnell Blanks, MD;  Location: [place] [facility];  Service: Cardiovascular;  Laterality: N/A;   LEFT HEART CATH AND CORS/GRAFTS ANGIOGRAPHY N/A 07/21/2021   Procedure: LEFT HEART CATH AND CORS/GRAFTS ANGIOGRAPHY;  Surgeon: Nelva Bush, MD;  Location: [place] [facility];  Service: Cardiovascular;  Laterality: N/A;   METATARSAL HEAD  EXCISION Right 05/02/2020   Procedure: FIRST METATARSAL HEAD RESECTION; RIGHT FOOT WOUND CLOSURE;  Surgeon: Landis Martins, DPM;  Location: [place];  Service: Podiatry;  Laterality: Right;  MAC W/LOCAL   TUBAL LIGATION      Current Medications: Current Meds  Medication Sig   amiodarone (PACERONE) 200 MG tablet Take 1 tablet (200 mg total) by mouth daily.   aspirin EC 81 MG tablet Take 1 tablet (81 mg total) by mouth daily.   clopidogrel (PLAVIX) 75  MG tablet Take 1 tablet (75 mg total) by mouth daily.   dapagliflozin propanediol (FARXIGA) 10 MG TABS tablet Take 1 tablet (10 mg total) by mouth daily before breakfast.   Evolocumab (REPATHA SURECLICK) 944 MG/ML SOAJ Inject 140 mg into the skin every 14 (fourteen) days.   fenofibrate (TRICOR) 145 MG tablet Take 1 tablet (145 mg total) by mouth daily.   furosemide (LASIX) 40 MG tablet Take 1 tablet (40 mg total) by mouth daily.   isosorbide mononitrate (IMDUR) 30 MG 24 hr tablet Take 1 tablet (30 mg total) by mouth daily.   metFORMIN (GLUCOPHAGE) 500 MG tablet Take 1 tablet (500 mg total) by mouth 2 (two) times daily.   metoprolol succinate (TOPROL XL) 25 MG 24 hr tablet Take 1 tablet (25 mg total) by mouth 3 (three) times daily.   nitroGLYCERIN (NITROSTAT) 0.4 MG SL tablet Place 1 tablet (0.4 mg total) under the tongue every 5 (five) minutes as needed for chest pain.   nystatin (MYCOSTATIN/NYSTOP) powder Apply 1 application topically daily.   omeprazole (PRILOSEC) 20 MG capsule Take 1 capsule (20 mg total) by mouth daily.   rosuvastatin (CRESTOR) 20 MG tablet Take 1 tablet (20 mg total) by mouth at bedtime.   TRESIBA FLEXTOUCH 100 UNIT/ML FlexTouch Pen Inject 45 Units into the skin daily.   valsartan (DIOVAN) 80 MG tablet Take 1 tablet (80 mg total) by mouth 2 (two) times daily.   vitamin C (ASCORBIC ACID) 250 MG tablet Take 250 mg by mouth at bedtime.     Allergies:   Chlorhexidine gluconate, Cat hair extract, Tramadol, and Codeine   Social History   Socioeconomic History   Marital status: Married    Spouse name: Not on file   Number of children: Not on file   Years of education: Not on file   Highest education level: Not on file  Occupational History   Not on file  Tobacco Use   Smoking status: Former    Types: Cigarettes    Quit date: 05/2016    Years since quitting: 5.2   Smokeless tobacco: Never  Vaping Use   Vaping Use: Never used  Substance and Sexual Activity   Alcohol  use: No   Drug use: No   Sexual activity: Yes  Other Topics Concern   Not on file  Social History Narrative   Not on file   Social Determinants of Health   Financial Resource Strain: Not on file  Food Insecurity: Not on file  Transportation Needs: Not on file  Physical Activity: Not on file  Stress: Not on file  Social Connections: Not on file     Family History: The patient's family history includes Alzheimer's disease in her father; Heart attack in her father; Heart disease in her father; Hyperlipidemia in her brother and mother; Hypertension in her brother, father, and mother. ROS:   Please see the history of present illness.    All other systems reviewed and are negative.  EKGs/Labs/Other  Studies Reviewed:    The following studies were reviewed today:   Recent Labs: 12/20/2020: Magnesium 2.0 01/12/2021: NT-Pro BNP 246; TSH 2.790 07/20/2021: ALT 34; B Natriuretic Peptide 86.1 07/22/2021: BUN 15; Creatinine, Ser 1.09; Hemoglobin 10.5; Platelets 225; Potassium 4.7; Sodium 136  Recent Lipid Panel    Component Value Date/Time   CHOL 148 07/20/2021 1232   CHOL 351 (H) 02/14/2021 1042   TRIG 186 (H) 07/20/2021 1232   HDL 55 07/20/2021 1232   HDL 60 02/14/2021 1042   CHOLHDL 2.7 07/20/2021 1232   VLDL 37 07/20/2021 1232   LDLCALC 56 07/20/2021 1232   LDLCALC 197 (H) 02/14/2021 1042   LDLDIRECT 91.4 05/22/2020 2122    Physical Exam:    VS:  BP 122/70 (BP Location: Right Arm, Patient Position: Sitting, Cuff Size: Normal)   Pulse 70   Ht _0  (1.702 m)   Wt 247 lb 3.2 oz (112.1 kg)   LMP  (LMP Unknown)   SpO2 98%   BMI 38.72 kg/m     Wt Readings from Last 3 Encounters:  08/29/21 247 lb 3.2 oz (112.1 kg)  07/22/21 251 lb (113.9 kg)  05/24/21 248 lb (112.5 kg)     GEN:  Well nourished, well developed in no acute distress HEENT: Normal NECK: No JVD; No carotid bruits LYMPHATICS: No lymphadenopathy CARDIAC: RRR, no murmurs, rubs, gallops RESPIRATORY:  Clear to  auscultation without rales, wheezing or rhonchi  ABDOMEN: Soft, non-tender, non-distended MUSCULOSKELETAL:  No edema; No deformity  SKIN: Warm and dry NEUROLOGIC:  Alert and oriented x 3 PSYCHIATRIC:  Normal affect    Signed, Shirlee More, MD  08/29/2021 4:05 PM    [facility]

## 2021-09-04 DIAGNOSIS — R928 Other abnormal and inconclusive findings on diagnostic imaging of breast: Secondary | ICD-10-CM | POA: Insufficient documentation

## 2021-09-04 DIAGNOSIS — S21001D Unspecified open wound of right breast, subsequent encounter: Secondary | ICD-10-CM | POA: Insufficient documentation

## 2021-09-04 HISTORY — DX: Unspecified open wound of right breast, subsequent encounter: S21.001D

## 2021-09-04 HISTORY — DX: Other abnormal and inconclusive findings on diagnostic imaging of breast: R92.8

## 2021-09-15 ENCOUNTER — Encounter: Payer: Self-pay | Admitting: Sports Medicine

## 2021-09-15 ENCOUNTER — Ambulatory Visit: Payer: Managed Care, Other (non HMO) | Admitting: Sports Medicine

## 2021-09-15 ENCOUNTER — Other Ambulatory Visit: Payer: Self-pay

## 2021-09-15 DIAGNOSIS — R234 Changes in skin texture: Secondary | ICD-10-CM | POA: Diagnosis not present

## 2021-09-15 DIAGNOSIS — Z89419 Acquired absence of unspecified great toe: Secondary | ICD-10-CM | POA: Diagnosis not present

## 2021-09-15 DIAGNOSIS — E1165 Type 2 diabetes mellitus with hyperglycemia: Secondary | ICD-10-CM | POA: Diagnosis not present

## 2021-09-15 DIAGNOSIS — L97521 Non-pressure chronic ulcer of other part of left foot limited to breakdown of skin: Secondary | ICD-10-CM | POA: Diagnosis not present

## 2021-09-15 NOTE — Progress Notes (Signed)
Subjective: Jasmine Richardson is a 60 y.o. female patient seen in office for evaluation of ulceration of the left great toe.  Patient has a history of diabetes and a blood glucose level of 263 and last A1c around 11.  Patient reports that she was using a Band-Aid over the area to protect the area of callused skin from rubbing and when she removed it pulled the skin and caused the wound and states it has been this way for 2 to 3 day denies nausea/fever/vomiting/chills/night sweats/shortness of breath/pain. Patient has no other pedal complaints at this time.  Patient Active Problem List   Diagnosis Date Noted   Abnormal mammogram of left breast 09/04/2021   Breast wound, right, subsequent encounter 09/04/2021   Demand ischemia ([place])    ICD (implantable cardioverter-defibrillator) in place 05/16/2021   Ventricular tachycardia ([employer]) 12/17/2020   Unstable angina ([place]) 05/22/2020   NSTEMI (non-ST elevated myocardial infarction) ([place]) 05/22/2020   Diabetic foot ulcer ([place]) 02/16/2020   Angina pectoris ([place]) 09/11/2017   Emphysema lung ([place]) 09/11/2017   Chronic diastolic heart failure ([place]) 11/06/2016   Mediastinitis 06/28/2016   Wound, surgical, infected 06/06/2016   S/P CABG (coronary artery bypass graft) 06/03/2016   Severe sepsis ([place]) 06/03/2016   GERD (gastroesophageal reflux disease) 05/08/2016   Morbid obesity ([place]) 05/08/2016   Uncontrolled type 2 diabetes mellitus ([place]) 05/08/2016   Sinus tachycardia 05/08/2016   Acute chest pain 05/08/2016   Burping 05/08/2016   Tobacco use disorder 04/20/2016   Coronary artery disease involving native coronary artery of native heart with angina pectoris ([place]) 09/12/2015   Essential hypertension 09/12/2015   Hyperlipidemia 09/12/2015   Current Outpatient Medications on File Prior to Visit  Medication Sig Dispense Refill   amiodarone (PACERONE) 200 MG tablet Take by mouth.     metFORMIN (GLUCOPHAGE) 500 MG tablet Take by mouth.     amiodarone  (PACERONE) 200 MG tablet Take 1 tablet (200 mg total) by mouth daily. 90 tablet 0   amoxicillin-clavulanate (AUGMENTIN) 875-125 MG tablet Take 1 tablet by mouth 2 (two) times daily.     aspirin 81 MG chewable tablet Chew by mouth.     aspirin EC 81 MG tablet Take 1 tablet (81 mg total) by mouth daily. 90 tablet 0   clindamycin (CLEOCIN) 300 MG capsule Take 300 mg by mouth 3 (three) times daily.     clopidogrel (PLAVIX) 75 MG tablet Take 1 tablet (75 mg total) by mouth daily. 90 tablet 0   cyanocobalamin 1000 MCG tablet Take by mouth.     dapagliflozin propanediol (FARXIGA) 10 MG TABS tablet Take 1 tablet (10 mg total) by mouth daily before breakfast. 90 tablet 0   Evolocumab (REPATHA SURECLICK) 341 MG/ML SOAJ Inject 140 mg into the skin every 14 (fourteen) days. 2 mL 11   fenofibrate (TRICOR) 145 MG tablet Take 1 tablet (145 mg total) by mouth daily. 90 tablet 0   fluconazole (DIFLUCAN) 150 MG tablet Take 150 mg by mouth every 3 (three) days.     furosemide (LASIX) 40 MG tablet Take 1 tablet (40 mg total) by mouth daily. 90 tablet 0   glipiZIDE (GLUCOTROL) 10 MG tablet Take by mouth.     HUMALOG KWIKPEN 100 UNIT/ML KwikPen INJECT SUBCUTANEOUSLY BEFORE BREAKFAST, LUNCH, AND SUPPER PER SLIDING SCALE AS FOLLOWS: 3 UNITS IF SUGARS 120-150, 5 UNITS IF 151-180, 7 UNITS IF 181-240, 9 UNITS IF 241-300, 12 UNITS IF 301-350, 15 UNITS BEYOND 351, CALL MD IF OVER 400. MAX DAILY  DOSE 50     HYDROcodone-acetaminophen (NORCO/VICODIN) 5-325 MG tablet Take by mouth.     isosorbide mononitrate (IMDUR) 30 MG 24 hr tablet Take 1 tablet (30 mg total) by mouth daily. 30 tablet 0   LORazepam (ATIVAN) 1 MG tablet Take by mouth.     metFORMIN (GLUCOPHAGE) 500 MG tablet Take 1 tablet (500 mg total) by mouth 2 (two) times daily. 180 tablet 0   metoprolol succinate (TOPROL XL) 25 MG 24 hr tablet Take 1 tablet (25 mg total) by mouth 3 (three) times daily. 90 tablet 0   nitroGLYCERIN (NITROSTAT) 0.4 MG SL tablet Place 1 tablet  (0.4 mg total) under the tongue every 5 (five) minutes as needed for chest pain. 270 tablet 12   nystatin (MYCOSTATIN/NYSTOP) powder Apply 1 application topically daily.     omeprazole (PRILOSEC) 20 MG capsule Take 1 capsule (20 mg total) by mouth daily. 90 capsule 0   rosuvastatin (CRESTOR) 20 MG tablet Take 1 tablet (20 mg total) by mouth at bedtime. 90 tablet 0   TRESIBA FLEXTOUCH 100 UNIT/ML FlexTouch Pen Inject 45 Units into the skin daily. 3 mL 1   valsartan (DIOVAN) 80 MG tablet Take 1 tablet (80 mg total) by mouth 2 (two) times daily. 180 tablet 3   vitamin C (ASCORBIC ACID) 250 MG tablet Take 250 mg by mouth at bedtime.     Vitamin D, Ergocalciferol, (DRISDOL) 1.25 MG (50000 UNIT) CAPS capsule Take 1 capsule by mouth once a week. (Patient not taking: Reported on 08/29/2021)     No current facility-administered medications on file prior to visit.   Allergies  Allergen Reactions   Chlorhexidine Gluconate Itching    Received CHG bath, began itching, required benadryl   Cat Hair Extract Other (See Comments)    Sneezing, watery eyes.   Tramadol Other (See Comments)    Hallucinations   Codeine Rash    Recent Results (from the past 2160 hour(s))  CUP PACEART REMOTE DEVICE CHECK     Status: None   Collection Time: 06/20/21  2:00 AM  Result Value Ref Range   Date Time Interrogation Session [id number]    Pulse Generator Manufacturer OTHER    Pulse Gen Model [id number] [place] DR    Pulse Gen Serial Number [id number]    Clinic Name [facility]    Implantable Pulse Generator Type Implantable Cardiac Defibulator    Implantable Pulse Generator Implant Date 98921194    Implantable Lead Manufacturer [facility]    Implantable Lead Model [id number] Durata SJ4    Implantable Lead Serial Number [id number]    Implantable Lead Implant Date 14481856    Implantable Lead Location Detail 1 UNKNOWN    Implantable Lead Location [id number]    Implantable Lead Manufacturer [facility]    Implantable Lead Model  LPA1200M Tendril MRI    Implantable Lead Serial Number [id number]    Implantable Lead Implant Date 31497026    Implantable Lead Location Detail 1 UNKNOWN    Implantable Lead Location [phone]    Lead Channel Setting Sensing Sensitivity 0.5 mV   Lead Channel Setting Sensing Adaptation Mode Adaptive Sensing    Lead Channel Setting Pacing Amplitude 2.0 V   Lead Channel Setting Pacing Pulse Width 0.5 ms   Lead Channel Setting Pacing Amplitude 2.5 V   Lead Channel Status NULL    Lead Channel Impedance Value 380 ohm   Lead Channel Sensing Intrinsic Amplitude 5.0 mV   Lead Channel Pacing Threshold Amplitude 0.75 V   Lead Channel  Pacing Threshold Pulse Width 0.5 ms   Lead Channel Status NULL    Lead Channel Impedance Value 500 ohm   Lead Channel Sensing Intrinsic Amplitude 12.0 mV   Lead Channel Pacing Threshold Amplitude 1.0 V   Lead Channel Pacing Threshold Pulse Width 0.5 ms   HighPow Impedance 68 ohm   HighPow Imped Status NULL    Battery Status MOS    Battery Remaining Longevity 107 mo   Battery Remaining Percentage 94.0 %   Battery Voltage 3.04 V   Brady Statistic RA Percent Paced 1.5 %   Brady Statistic RV Percent Paced 1.0 %   Brady Statistic AP VP Percent 1.0 %   Brady Statistic AS VP Percent 0 %   Brady Statistic AP VS Percent 1.5 %   Brady Statistic AS VS Percent 98.0 %  Troponin I (High Sensitivity)     Status: None   Collection Time: 07/20/21  1:04 AM  Result Value Ref Range   Troponin I (High Sensitivity) 9 <18 ng/L    Comment: (NOTE) Elevated high sensitivity troponin I (hsTnI) values and significant  changes across serial measurements may suggest ACS but many other  chronic and acute conditions are known to elevate hsTnI results.  Refer to the "Links" section for chest pain algorithms and additional  guidance. Performed at [facility], [place] [address], [place], [place] [address]   Comprehensive metabolic panel     Status: Abnormal   Collection Time: 07/20/21   1:04 AM  Result Value Ref Range   Sodium 136 135 - 145 mmol/L   Potassium 5.1 3.5 - 5.1 mmol/L   Chloride 101 98 - 111 mmol/L   CO2 25 22 - 32 mmol/L   Glucose, Bld 489 (H) 70 - 99 mg/dL    Comment: Glucose reference range applies only to samples taken after fasting for at least 8 hours.   BUN 19 6 - 20 mg/dL   Creatinine, Ser 1.48 (H) 0.44 - 1.00 mg/dL   Calcium 10.4 (H) 8.9 - 10.3 mg/dL   Total Protein 7.3 6.5 - 8.1 g/dL   Albumin 3.7 3.5 - 5.0 g/dL   AST 39 15 - 41 U/L   ALT 34 0 - 44 U/L   Alkaline Phosphatase 40 38 - 126 U/L   Total Bilirubin 0.6 0.3 - 1.2 mg/dL   GFR, Estimated 40 (L) >60 mL/min    Comment: (NOTE) Calculated using the CKD-EPI Creatinine Equation (2021)    Anion gap 10 5 - 15    Comment: Performed at [facility] [address] [place], [place] [address]  CBC with Differential     Status: Abnormal   Collection Time: 07/20/21  1:04 AM  Result Value Ref Range   WBC 7.0 4.0 - 10.5 K/uL   RBC 3.88 3.87 - 5.11 MIL/uL   Hemoglobin 11.2 (L) 12.0 - 15.0 g/dL   HCT 34.8 (L) 36.0 - 46.0 %   MCV 89.7 80.0 - 100.0 fL   MCH 28.9 26.0 - 34.0 pg   MCHC 32.2 30.0 - 36.0 g/dL   RDW 13.5 11.5 - 15.5 %   Platelets 260 150 - 400 K/uL   nRBC 0.0 0.0 - 0.2 %   Neutrophils Relative % 71 %   Neutro Abs 5.0 1.7 - 7.7 K/uL   Lymphocytes Relative 18 %   Lymphs Abs 1.3 0.7 - 4.0 K/uL   Monocytes Relative 9 %   Monocytes Absolute 0.6 0.1 - 1.0 K/uL  Eosinophils Relative 1 %   Eosinophils Absolute 0.1 0.0 - 0.5 K/uL   Basophils Relative 0 %   Basophils Absolute 0.0 0.0 - 0.1 K/uL   Immature Granulocytes 1 %   Abs Immature Granulocytes 0.04 0.00 - 0.07 K/uL    Comment: Performed at [facility] [address] [place], [place] [address]  Brain natriuretic peptide     Status: None   Collection Time: 07/20/21  1:04 AM  Result Value Ref Range   B Natriuretic Peptide 86.1 0.0 - 100.0 pg/mL    Comment: Performed at [facility] [address],  [place], [place] [address]  Hemoglobin A1c     Status: Abnormal   Collection Time: 07/20/21  1:04 AM  Result Value Ref Range   Hgb A1c MFr Bld 12.0 (H) 4.8 - 5.6 %    Comment: (NOTE) Pre diabetes:          5.7%-6.4%  Diabetes:              >6.4%  Glycemic control for   <7.0% adults with diabetes    Mean Plasma Glucose 297.7 mg/dL    Comment: Performed at [facility] [address], [place], [place] [address]  Troponin I (High Sensitivity)     Status: Abnormal   Collection Time: 07/20/21  3:27 AM  Result Value Ref Range   Troponin I (High Sensitivity) 18 (H) <18 ng/L    Comment: (NOTE) Elevated high sensitivity troponin I (hsTnI) values and significant  changes across serial measurements may suggest ACS but many other  chronic and acute conditions are known to elevate hsTnI results.  Refer to the "Links" section for chest pain algorithms and additional  guidance. Performed at [facility], [place] [address], [place], [place] [address]   Urinalysis, Routine w reflex microscopic Urine, Clean Catch     Status: Abnormal   Collection Time: 07/20/21  9:58 AM  Result Value Ref Range   Color, Urine STRAW (A) YELLOW   APPearance CLEAR CLEAR   Specific Gravity, Urine 1.014 1.005 - 1.030   pH 6.0 5.0 - 8.0   Glucose, UA >=500 (A) NEGATIVE mg/dL   Hgb urine dipstick NEGATIVE NEGATIVE   Bilirubin Urine NEGATIVE NEGATIVE   Ketones, ur NEGATIVE NEGATIVE mg/dL   Protein, ur 30 (A) NEGATIVE mg/dL   Nitrite NEGATIVE NEGATIVE   Leukocytes,Ua TRACE (A) NEGATIVE   RBC / HPF 0-5 0 - 5 RBC/hpf   WBC, UA 6-10 0 - 5 WBC/hpf   Bacteria, UA RARE (A) NONE SEEN   Squamous Epithelial / LPF 0-5 0 - 5   Budding Yeast PRESENT    Non Squamous Epithelial 0-5 (A) NONE SEEN    Comment: Performed at [facility], [place] [address], [place], [place] [address]  Resp Panel by RT-PCR (Flu A&B, Covid) Nasopharyngeal Swab     Status: None   Collection Time: 07/20/21 10:33 AM   Specimen: Nasopharyngeal  Swab; Nasopharyngeal(NP) swabs in vial transport medium  Result Value Ref Range   SARS Coronavirus 2 by RT PCR NEGATIVE NEGATIVE    Comment: (NOTE) SARS-CoV-2 target nucleic acids are NOT DETECTED.  The SARS-CoV-2 RNA is generally detectable in upper respiratory specimens during the acute phase of infection. The lowest concentration of SARS-CoV-2 viral copies this assay can detect is 138 copies/mL. A negative result does not preclude SARS-Cov-2 infection and should not be used as the sole basis for treatment or other patient management  decisions. A negative result may occur with  improper specimen collection/handling, submission of specimen other than nasopharyngeal swab, presence of viral mutation(s) within the areas targeted by this assay, and inadequate number of viral copies(<138 copies/mL). A negative result must be combined with clinical observations, patient history, and epidemiological information. The expected result is Negative.  Fact Sheet for Patients:  EntrepreneurPulse.com.au  Fact Sheet for Healthcare Providers:  IncredibleEmployment.be  This test is no t yet approved or cleared by the [place] FDA and  has been authorized for detection and/or diagnosis of SARS-CoV-2 by FDA under an Emergency Use Authorization (EUA). This EUA will remain  in effect (meaning this test can be used) for the duration of the COVID-19 declaration under Section 564(b)(1) of the Act, 21 U.S.C.section 360bbb-3(b)(1), unless the authorization is terminated  or revoked sooner.       Influenza A by PCR NEGATIVE NEGATIVE   Influenza B by PCR NEGATIVE NEGATIVE    Comment: (NOTE) The Xpert Xpress SARS-CoV-2/FLU/RSV plus assay is intended as an aid in the diagnosis of influenza from Nasopharyngeal swab specimens and should not be used as a sole basis for treatment. Nasal washings and aspirates are unacceptable for Xpert Xpress  SARS-CoV-2/FLU/RSV testing.  Fact Sheet for Patients: EntrepreneurPulse.com.au  Fact Sheet for Healthcare Providers: IncredibleEmployment.be  This test is not yet approved or cleared by the [place] FDA and has been authorized for detection and/or diagnosis of SARS-CoV-2 by FDA under an Emergency Use Authorization (EUA). This EUA will remain in effect (meaning this test can be used) for the duration of the COVID-19 declaration under Section 564(b)(1) of the Act, 21 U.S.C. section 360bbb-3(b)(1), unless the authorization is terminated or revoked.  Performed at [facility], [place] [address] [place], [place] [address]   Troponin I (High Sensitivity)     Status: Abnormal   Collection Time: 07/20/21 12:32 PM  Result Value Ref Range   Troponin I (High Sensitivity) 33 (H) <18 ng/L    Comment: (NOTE) Elevated high sensitivity troponin I (hsTnI) values and significant  changes across serial measurements may suggest ACS but many other  chronic and acute conditions are known to elevate hsTnI results.  Refer to the "Links" section for chest pain algorithms and additional  guidance. Performed at [facility], [place] [address] [place], [place] [address]   Lipid panel     Status: Abnormal   Collection Time: 07/20/21 12:32 PM  Result Value Ref Range   Cholesterol 148 0 - 200 mg/dL   Triglycerides 186 (H) <150 mg/dL   HDL 55 >40 mg/dL   Total CHOL/HDL Ratio 2.7 RATIO   VLDL 37 0 - 40 mg/dL   LDL Cholesterol 56 0 - 99 mg/dL    Comment:        Total Cholesterol/HDL:CHD Risk Coronary Heart Disease Risk Table                     Men   Women  1/2 Average Risk   3.4   3.3  Average Risk       5.0   4.4  2 X Average Risk   9.6   7.1  3 X Average Risk  23.4   11.0        Use the calculated Patient Ratio above and the CHD Risk Table to determine the patient's CHD Risk.        ATP III CLASSIFICATION (LDL):  <100     mg/dL  Optimal   100-129  mg/dL   Near or Above                    Optimal  130-159  mg/dL   Borderline  160-189  mg/dL   High  >190     mg/dL   Very High Performed at [facility] [address] [place], [place] [address]   CBG monitoring, ED     Status: Abnormal   Collection Time: 07/20/21  4:30 PM  Result Value Ref Range   Glucose-Capillary 344 (H) 70 - 99 mg/dL    Comment: Glucose reference range applies only to samples taken after fasting for at least 8 hours.  Protime-INR     Status: None   Collection Time: 07/20/21  4:51 PM  Result Value Ref Range   Prothrombin Time 14.1 11.4 - 15.2 seconds   INR 1.1 0.8 - 1.2    Comment: (NOTE) INR goal varies based on device and disease states. Performed at [facility], [place] [address], [place], [place] [address]   Heparin level (unfractionated)     Status: Abnormal   Collection Time: 07/20/21  4:52 PM  Result Value Ref Range   Heparin Unfractionated <0.10 (L) 0.30 - 0.70 IU/mL    Comment: (NOTE) The clinical reportable range upper limit is being lowered to >1.10 to align with the FDA approved guidance for the current laboratory assay.  If heparin results are below expected values, and patient dosage has  been confirmed, suggest follow up testing of antithrombin III levels. Performed at [facility], [place] [address] [place], [place] [address]   Glucose, capillary     Status: Abnormal   Collection Time: 07/20/21  9:18 PM  Result Value Ref Range   Glucose-Capillary 315 (H) 70 - 99 mg/dL    Comment: Glucose reference range applies only to samples taken after fasting for at least 8 hours.   Comment 1 Notify RN    Comment 2 Document in Chart   HIV Antibody (routine testing w rflx)     Status: None   Collection Time: 07/20/21 10:21 PM  Result Value Ref Range   HIV Screen 4th Generation wRfx Non Reactive Non Reactive    Comment: Performed at [facility], [place] [address] [place], [place] [address]  Parathyroid hormone,  intact (no Ca)     Status: None   Collection Time: 07/20/21 10:21 PM  Result Value Ref Range   PTH 16 15 - 65 pg/mL    Comment: (NOTE) Performed At: [facility] [place], [place] [id number] Rush Farmer MD [id number]   Troponin I (High Sensitivity)     Status: Abnormal   Collection Time: 07/20/21 10:21 PM  Result Value Ref Range   Troponin I (High Sensitivity) 27 (H) <18 ng/L    Comment: (NOTE) Elevated high sensitivity troponin I (hsTnI) values and significant  changes across serial measurements may suggest ACS but many other  chronic and acute conditions are known to elevate hsTnI results.  Refer to the "Links" section for chest pain algorithms and additional  guidance. Performed at [facility], [place] [address] [place], [place] [address]   Heparin level (unfractionated)     Status: Abnormal   Collection Time: 07/20/21 10:21 PM  Result Value Ref Range   Heparin Unfractionated 0.27 (L) 0.30 - 0.70 IU/mL    Comment: (NOTE) The clinical reportable range upper limit is being lowered to >  1.10 to align with the FDA approved guidance for the current laboratory assay.  If heparin results are below expected values, and patient dosage has  been confirmed, suggest follow up testing of antithrombin III levels. Performed at [facility], [place] [address] [place], [place] [address]   Basic metabolic panel     Status: Abnormal   Collection Time: 07/21/21  3:56 AM  Result Value Ref Range   Sodium 136 135 - 145 mmol/L   Potassium 4.1 3.5 - 5.1 mmol/L   Chloride 103 98 - 111 mmol/L   CO2 25 22 - 32 mmol/L   Glucose, Bld 319 (H) 70 - 99 mg/dL    Comment: Glucose reference range applies only to samples taken after fasting for at least 8 hours.   BUN 19 6 - 20 mg/dL   Creatinine, Ser 1.22 (H) 0.44 - 1.00 mg/dL   Calcium 9.6 8.9 - 10.3 mg/dL   GFR, Estimated 51 (L) >60 mL/min    Comment: (NOTE) Calculated using the CKD-EPI Creatinine Equation  (2021)    Anion gap 8 5 - 15    Comment: Performed at [facility] [address], [place], [place] [address]  CBC     Status: Abnormal   Collection Time: 07/21/21  3:56 AM  Result Value Ref Range   WBC 7.1 4.0 - 10.5 K/uL   RBC 3.60 (L) 3.87 - 5.11 MIL/uL   Hemoglobin 10.4 (L) 12.0 - 15.0 g/dL   HCT 32.1 (L) 36.0 - 46.0 %   MCV 89.2 80.0 - 100.0 fL   MCH 28.9 26.0 - 34.0 pg   MCHC 32.4 30.0 - 36.0 g/dL   RDW 13.3 11.5 - 15.5 %   Platelets 229 150 - 400 K/uL   nRBC 0.0 0.0 - 0.2 %    Comment: Performed at [facility], [place] [address], [place], [place] [address]  Troponin I (High Sensitivity)     Status: Abnormal   Collection Time: 07/21/21  3:56 AM  Result Value Ref Range   Troponin I (High Sensitivity) 22 (H) <18 ng/L    Comment: (NOTE) Elevated high sensitivity troponin I (hsTnI) values and significant  changes across serial measurements may suggest ACS but many other  chronic and acute conditions are known to elevate hsTnI results.  Refer to the "Links" section for chest pain algorithms and additional  guidance. Performed at [facility], [place] [address], [place], [place] [address]   Heparin level (unfractionated)     Status: Abnormal   Collection Time: 07/21/21  5:45 AM  Result Value Ref Range   Heparin Unfractionated 0.22 (L) 0.30 - 0.70 IU/mL    Comment: (NOTE) The clinical reportable range upper limit is being lowered to >1.10 to align with the FDA approved guidance for the current laboratory assay.  If heparin results are below expected values, and patient dosage has  been confirmed, suggest follow up testing of antithrombin III levels. Performed at [facility], [place] [address] [place], [place] [address]   Glucose, capillary     Status: Abnormal   Collection Time: 07/21/21  6:10 AM  Result Value Ref Range   Glucose-Capillary 281 (H) 70 - 99 mg/dL    Comment: Glucose reference range applies only to samples taken after fasting for at  least 8 hours.   Comment 1 Notify RN    Comment 2 Document in Chart   Glucose, capillary     Status: Abnormal  Collection Time: 07/21/21 11:54 AM  Result Value Ref Range   Glucose-Capillary 213 (H) 70 - 99 mg/dL    Comment: Glucose reference range applies only to samples taken after fasting for at least 8 hours.  Microalbumin / creatinine urine ratio     Status: Abnormal   Collection Time: 07/21/21 12:10 PM  Result Value Ref Range   Microalb, Ur 293.5 (H) Not Estab. ug/mL   Microalb Creat Ratio 258 (H) 0 - 29 mg/g creat    Comment: (NOTE)                       Normal:                0 -  29                       Moderately increased: 30 - 300                       Severely increased:       >300 Performed At: [facility] [address] [place], [place] [id number] Rush Farmer MD [id number]    Creatinine, Urine 113.9 Not Estab. mg/dL  Protein / creatinine ratio, urine     Status: Abnormal   Collection Time: 07/21/21 12:10 PM  Result Value Ref Range   Creatinine, Urine 131.66 mg/dL   Total Protein, Urine 61 mg/dL    Comment: NO NORMAL RANGE ESTABLISHED FOR THIS TEST   Protein Creatinine Ratio 0.46 (H) 0.00 - 0.15 mg/mg[Cre]    Comment: Performed at [facility] [address] [place], [place] [address]  Heparin level (unfractionated)     Status: None   Collection Time: 07/21/21  1:25 PM  Result Value Ref Range   Heparin Unfractionated 0.49 0.30 - 0.70 IU/mL    Comment: (NOTE) The clinical reportable range upper limit is being lowered to >1.10 to align with the FDA approved guidance for the current laboratory assay.  If heparin results are below expected values, and patient dosage has  been confirmed, suggest follow up testing of antithrombin III levels. Performed at [facility], [place] [address], [place], [place] [address]   Glucose, capillary     Status: Abnormal   Collection Time: 07/21/21  4:58 PM  Result Value Ref Range   Glucose-Capillary  157 (H) 70 - 99 mg/dL    Comment: Glucose reference range applies only to samples taken after fasting for at least 8 hours.  Glucose, capillary     Status: Abnormal   Collection Time: 07/21/21  9:04 PM  Result Value Ref Range   Glucose-Capillary 249 (H) 70 - 99 mg/dL    Comment: Glucose reference range applies only to samples taken after fasting for at least 8 hours.  CBC     Status: Abnormal   Collection Time: 07/22/21  4:39 AM  Result Value Ref Range   WBC 7.0 4.0 - 10.5 K/uL   RBC 3.58 (L) 3.87 - 5.11 MIL/uL   Hemoglobin 10.5 (L) 12.0 - 15.0 g/dL   HCT 32.3 (L) 36.0 - 46.0 %   MCV 90.2 80.0 - 100.0 fL   MCH 29.3 26.0 - 34.0 pg   MCHC 32.5 30.0 - 36.0 g/dL   RDW 13.4 11.5 - 15.5 %   Platelets 225 150 - 400 K/uL   nRBC 0.0 0.0 - 0.2 %    Comment: Performed at [facility],  [address], [place], [place] [address]  Basic metabolic panel     Status: Abnormal   Collection Time: 07/22/21  4:39 AM  Result Value Ref Range   Sodium 136 135 - 145 mmol/L   Potassium 4.7 3.5 - 5.1 mmol/L   Chloride 107 98 - 111 mmol/L   CO2 23 22 - 32 mmol/L   Glucose, Bld 296 (H) 70 - 99 mg/dL    Comment: Glucose reference range applies only to samples taken after fasting for at least 8 hours.   BUN 15 6 - 20 mg/dL   Creatinine, Ser 1.09 (H) 0.44 - 1.00 mg/dL   Calcium 9.0 8.9 - 10.3 mg/dL   GFR, Estimated 58 (L) >60 mL/min    Comment: (NOTE) Calculated using the CKD-EPI Creatinine Equation (2021)    Anion gap 6 5 - 15    Comment: Performed at [facility] [address], [place], [place] [address]  Glucose, capillary     Status: Abnormal   Collection Time: 07/22/21  5:55 AM  Result Value Ref Range   Glucose-Capillary 270 (H) 70 - 99 mg/dL    Comment: Glucose reference range applies only to samples taken after fasting for at least 8 hours.    Objective: There were no vitals filed for this visit.  General: Patient is awake, alert, oriented x 3 and in no acute  distress.  Dermatology: Skin is warm and dry bilateral with a partial thickness ulceration present left plantar hallux.  Ulceration measures 0.5 cm x 0.6 cm x 0.1 cm. There is a keratotic border with a granular and eschar base. The ulceration does not probe to bone. There is no malodor, no active drainage, no erythema, no edema. No acute signs of infection.  There are fissures in the heel worse on the left heel with no surrounding signs of infection.   Vascular: Dorsalis Pedis pulse = 1/4 Bilateral,  Posterior Tibial pulse = 1/4 Bilateral,  Capillary Fill Time < 5 seconds  Neurologic: Protective sensation absent bilateral.  Musculosketal: There is no pain to palpation to the ulcerated area at the left plantar hallux.  Patient is status post right hallux amputation.   No results for input(s): GRAMSTAIN, LABORGA in the last 8760 hours.  Assessment and Plan:  Problem List Items Addressed This Visit       Endocrine   Uncontrolled type 2 diabetes mellitus ([place])   Relevant Medications   aspirin 81 MG chewable tablet   glipiZIDE (GLUCOTROL) 10 MG tablet   HUMALOG KWIKPEN 100 UNIT/ML KwikPen   metFORMIN (GLUCOPHAGE) 500 MG tablet   Other Visit Diagnoses     Toe ulcer, left, limited to breakdown of skin ([place])    -  Primary   Fissure in skin of both feet       History of amputation of hallux ([place])           -Examined patient and discussed the progression of the wound and treatment alternatives. - Excisionally dedbrided ulceration at left plantar hallux to healthy bleeding borders removing nonviable tissue using a sterile chisel blade. Wound measures post debridement as above.  Wound was debrided to the level of the dermis with viable wound base exposed to promote healing. Hemostasis was achieved with manuel pressure. Patient tolerated procedure well without any discomfort or anesthesia necessary for this wound debridement.  -Applied Medihoney and dry sterile dressing and instructed  patient to continue with daily dressings at home consisting of same -Recommend foot miracle cream  to dry cracking heels and advised patient that with the help from her son may soak with Epson salt to help the cracking skin on the heels however must not soak more than 10 minutes - Advised patient to go to the ER or return to office if the wound worsens or if constitutional symptoms are present. -Patient to return to office in 2 weeks for follow up care and evaluation or sooner if problems arise.  Landis Martins, DPM

## 2021-09-15 NOTE — Patient Instructions (Signed)
To left toe wound apply a pea-sized amount of Medihoney and cover with gauze and coban once daily  For the cracked skin on the heels patient may soak with warm water and epsom salt (1/4) cup or take a shower and then after shower apply foot miracle and cover with ceran wrap and socks for 1 hour to help with the dry cracking skin to the heels

## 2021-09-19 ENCOUNTER — Ambulatory Visit (INDEPENDENT_AMBULATORY_CARE_PROVIDER_SITE_OTHER): Payer: Managed Care, Other (non HMO)

## 2021-09-19 DIAGNOSIS — I472 Ventricular tachycardia, unspecified: Secondary | ICD-10-CM | POA: Diagnosis not present

## 2021-09-19 LAB — CUP PACEART REMOTE DEVICE CHECK
Battery Remaining Longevity: 104 mo
Battery Remaining Percentage: 91 %
Battery Voltage: 3.02 V
Brady Statistic AP VP Percent: 1 %
Brady Statistic AP VS Percent: 4.5 %
Brady Statistic AS VP Percent: 1 %
Brady Statistic AS VS Percent: 95 %
Brady Statistic RA Percent Paced: 4.5 %
Brady Statistic RV Percent Paced: 1 %
Date Time Interrogation Session: 20221004020019
HighPow Impedance: 62 Ohm
Implantable Lead Implant Date: 20220103
Implantable Lead Implant Date: 20220103
Implantable Lead Location: 753859
Implantable Lead Location: 753860
Implantable Pulse Generator Implant Date: 20220103
Lead Channel Impedance Value: 360 Ohm
Lead Channel Impedance Value: 450 Ohm
Lead Channel Pacing Threshold Amplitude: 0.75 V
Lead Channel Pacing Threshold Amplitude: 1 V
Lead Channel Pacing Threshold Pulse Width: 0.5 ms
Lead Channel Pacing Threshold Pulse Width: 0.5 ms
Lead Channel Sensing Intrinsic Amplitude: 12 mV
Lead Channel Sensing Intrinsic Amplitude: 4.6 mV
Lead Channel Setting Pacing Amplitude: 2 V
Lead Channel Setting Pacing Amplitude: 2.5 V
Lead Channel Setting Pacing Pulse Width: 0.5 ms
Lead Channel Setting Sensing Sensitivity: 0.5 mV
Pulse Gen Serial Number: 111037319

## 2021-09-27 NOTE — Progress Notes (Signed)
Remote ICD transmission.   

## 2021-10-03 ENCOUNTER — Ambulatory Visit: Payer: Managed Care, Other (non HMO) | Admitting: Sports Medicine

## 2021-10-03 ENCOUNTER — Other Ambulatory Visit: Payer: Self-pay

## 2021-10-03 ENCOUNTER — Encounter: Payer: Self-pay | Admitting: Sports Medicine

## 2021-10-03 ENCOUNTER — Ambulatory Visit (INDEPENDENT_AMBULATORY_CARE_PROVIDER_SITE_OTHER): Payer: Managed Care, Other (non HMO)

## 2021-10-03 ENCOUNTER — Ambulatory Visit (INDEPENDENT_AMBULATORY_CARE_PROVIDER_SITE_OTHER): Payer: Managed Care, Other (non HMO) | Admitting: Sports Medicine

## 2021-10-03 DIAGNOSIS — L97521 Non-pressure chronic ulcer of other part of left foot limited to breakdown of skin: Secondary | ICD-10-CM | POA: Diagnosis not present

## 2021-10-03 DIAGNOSIS — E1165 Type 2 diabetes mellitus with hyperglycemia: Secondary | ICD-10-CM | POA: Diagnosis not present

## 2021-10-03 DIAGNOSIS — L03032 Cellulitis of left toe: Secondary | ICD-10-CM | POA: Diagnosis not present

## 2021-10-03 DIAGNOSIS — R230 Cyanosis: Secondary | ICD-10-CM | POA: Diagnosis not present

## 2021-10-03 DIAGNOSIS — L02612 Cutaneous abscess of left foot: Secondary | ICD-10-CM

## 2021-10-03 NOTE — Progress Notes (Signed)
Subjective: Jasmine Richardson is a 60 y.o. female patient seen in office for evaluation of ulceration of the left great toe.  Patient has a history of diabetes and a blood glucose level of 263 and last A1c around 11.  Patient reports that she was using a Band-Aid over the area to protect the area of callused skin from rubbing and when she removed it pulled the skin and caused the wound and states it has been this way for 2 to 3 day denies nausea/fever/vomiting/chills/night sweats/shortness of breath/pain. Patient has no other pedal complaints at this time.  Patient Active Problem List   Diagnosis Date Noted   Abnormal mammogram of left breast 09/04/2021   Breast wound, right, subsequent encounter 09/04/2021   Demand ischemia [facility])    ICD (implantable cardioverter-defibrillator) in place 05/16/2021   Ventricular tachycardia 12/17/2020   Unstable angina ([place]) 05/22/2020   NSTEMI (non-ST elevated myocardial infarction) ([place]) 05/22/2020   Diabetic foot ulcer ([place]) 02/16/2020   Angina pectoris ([place]) 09/11/2017   Emphysema lung ([place]) 09/11/2017   Chronic diastolic heart failure ([place]) 11/06/2016   Mediastinitis 06/28/2016   Wound, surgical, infected 06/06/2016   S/P CABG (coronary artery bypass graft) 06/03/2016   Severe sepsis ([place]) 06/03/2016   GERD (gastroesophageal reflux disease) 05/08/2016   Morbid obesity ([place]) 05/08/2016   Uncontrolled type 2 diabetes mellitus 05/08/2016   Sinus tachycardia 05/08/2016   Acute chest pain 05/08/2016   Burping 05/08/2016   Tobacco use disorder 04/20/2016   Coronary artery disease involving native coronary artery of native heart with angina pectoris ([place]) 09/12/2015   Essential hypertension 09/12/2015   Hyperlipidemia 09/12/2015   Current Outpatient Medications on File Prior to Visit  Medication Sig Dispense Refill   amiodarone (PACERONE) 200 MG tablet Take 1 tablet (200 mg total) by mouth daily. 90 tablet 0   amiodarone (PACERONE) 200 MG tablet Take by  mouth.     amoxicillin-clavulanate (AUGMENTIN) 875-125 MG tablet Take 1 tablet by mouth 2 (two) times daily.     aspirin 81 MG chewable tablet Chew by mouth.     aspirin EC 81 MG tablet Take 1 tablet (81 mg total) by mouth daily. 90 tablet 0   clindamycin (CLEOCIN) 300 MG capsule Take 300 mg by mouth 3 (three) times daily.     clopidogrel (PLAVIX) 75 MG tablet Take 1 tablet (75 mg total) by mouth daily. 90 tablet 0   cyanocobalamin 1000 MCG tablet Take by mouth.     dapagliflozin propanediol (FARXIGA) 10 MG TABS tablet Take 1 tablet (10 mg total) by mouth daily before breakfast. 90 tablet 0   Evolocumab (REPATHA SURECLICK) 403 MG/ML SOAJ Inject 140 mg into the skin every 14 (fourteen) days. 2 mL 11   fenofibrate (TRICOR) 145 MG tablet Take 1 tablet (145 mg total) by mouth daily. 90 tablet 0   fluconazole (DIFLUCAN) 150 MG tablet Take 150 mg by mouth every 3 (three) days.     furosemide (LASIX) 40 MG tablet Take 1 tablet (40 mg total) by mouth daily. 90 tablet 0   glipiZIDE (GLUCOTROL) 10 MG tablet Take by mouth.     HUMALOG KWIKPEN 100 UNIT/ML KwikPen INJECT SUBCUTANEOUSLY BEFORE BREAKFAST, LUNCH, AND SUPPER PER SLIDING SCALE AS FOLLOWS: 3 UNITS IF SUGARS 120-150, 5 UNITS IF 151-180, 7 UNITS IF 181-240, 9 UNITS IF 241-300, 12 UNITS IF 301-350, 15 UNITS BEYOND 351, CALL MD IF OVER 400. MAX DAILY DOSE 50     HYDROcodone-acetaminophen (NORCO/VICODIN) 5-325 MG tablet Take by mouth.  isosorbide mononitrate (IMDUR) 30 MG 24 hr tablet Take 1 tablet (30 mg total) by mouth daily. 30 tablet 0   LORazepam (ATIVAN) 1 MG tablet Take by mouth.     metFORMIN (GLUCOPHAGE) 500 MG tablet Take 1 tablet (500 mg total) by mouth 2 (two) times daily. 180 tablet 0   metFORMIN (GLUCOPHAGE) 500 MG tablet Take by mouth.     metoprolol succinate (TOPROL XL) 25 MG 24 hr tablet Take 1 tablet (25 mg total) by mouth 3 (three) times daily. 90 tablet 0   nitroGLYCERIN (NITROSTAT) 0.4 MG SL tablet Place 1 tablet (0.4 mg total)  under the tongue every 5 (five) minutes as needed for chest pain. 270 tablet 12   nystatin (MYCOSTATIN/NYSTOP) powder Apply 1 application topically daily.     omeprazole (PRILOSEC) 20 MG capsule Take 1 capsule (20 mg total) by mouth daily. 90 capsule 0   rosuvastatin (CRESTOR) 20 MG tablet Take 1 tablet (20 mg total) by mouth at bedtime. 90 tablet 0   TRESIBA FLEXTOUCH 100 UNIT/ML FlexTouch Pen Inject 45 Units into the skin daily. 3 mL 1   valsartan (DIOVAN) 80 MG tablet Take 1 tablet (80 mg total) by mouth 2 (two) times daily. 180 tablet 3   vitamin C (ASCORBIC ACID) 250 MG tablet Take 250 mg by mouth at bedtime.     Vitamin D, Ergocalciferol, (DRISDOL) 1.25 MG (50000 UNIT) CAPS capsule Take 1 capsule by mouth once a week. (Patient not taking: Reported on 08/29/2021)     No current facility-administered medications on file prior to visit.   Allergies  Allergen Reactions   Chlorhexidine Gluconate Itching    Received CHG bath, began itching, required benadryl   Cat Hair Extract Other (See Comments)    Sneezing, watery eyes.   Tramadol Other (See Comments)    Hallucinations   Codeine Rash    Recent Results (from the past 2160 hour(s))  Troponin I (High Sensitivity)     Status: None   Collection Time: 07/20/21  1:04 AM  Result Value Ref Range   Troponin I (High Sensitivity) 9 <18 ng/L    Comment: (NOTE) Elevated high sensitivity troponin I (hsTnI) values and significant  changes across serial measurements may suggest ACS but many other  chronic and acute conditions are known to elevate hsTnI results.  Refer to the "Links" section for chest pain algorithms and additional  guidance. Performed at [facility], [place] [address], [place], [place] [address]   Comprehensive metabolic panel     Status: Abnormal   Collection Time: 07/20/21  1:04 AM  Result Value Ref Range   Sodium 136 135 - 145 mmol/L   Potassium 5.1 3.5 - 5.1 mmol/L   Chloride 101 98 - 111 mmol/L   CO2 25 22 - 32  mmol/L   Glucose, Bld 489 (H) 70 - 99 mg/dL    Comment: Glucose reference range applies only to samples taken after fasting for at least 8 hours.   BUN 19 6 - 20 mg/dL   Creatinine, Ser 1.48 (H) 0.44 - 1.00 mg/dL   Calcium 10.4 (H) 8.9 - 10.3 mg/dL   Total Protein 7.3 6.5 - 8.1 g/dL   Albumin 3.7 3.5 - 5.0 g/dL   AST 39 15 - 41 U/L   ALT 34 0 - 44 U/L   Alkaline Phosphatase 40 38 - 126 U/L   Total Bilirubin 0.6 0.3 - 1.2 mg/dL   GFR, Estimated 40 (L) >60 mL/min  Comment: (NOTE) Calculated using the CKD-EPI Creatinine Equation (2021)    Anion gap 10 5 - 15    Comment: Performed at [facility], [place] [address], [place], [place] [address]  CBC with Differential     Status: Abnormal   Collection Time: 07/20/21  1:04 AM  Result Value Ref Range   WBC 7.0 4.0 - 10.5 K/uL   RBC 3.88 3.87 - 5.11 MIL/uL   Hemoglobin 11.2 (L) 12.0 - 15.0 g/dL   HCT 34.8 (L) 36.0 - 46.0 %   MCV 89.7 80.0 - 100.0 fL   MCH 28.9 26.0 - 34.0 pg   MCHC 32.2 30.0 - 36.0 g/dL   RDW 13.5 11.5 - 15.5 %   Platelets 260 150 - 400 K/uL   nRBC 0.0 0.0 - 0.2 %   Neutrophils Relative % 71 %   Neutro Abs 5.0 1.7 - 7.7 K/uL   Lymphocytes Relative 18 %   Lymphs Abs 1.3 0.7 - 4.0 K/uL   Monocytes Relative 9 %   Monocytes Absolute 0.6 0.1 - 1.0 K/uL   Eosinophils Relative 1 %   Eosinophils Absolute 0.1 0.0 - 0.5 K/uL   Basophils Relative 0 %   Basophils Absolute 0.0 0.0 - 0.1 K/uL   Immature Granulocytes 1 %   Abs Immature Granulocytes 0.04 0.00 - 0.07 K/uL    Comment: Performed at [facility] [address] [place], [place] [address]  Brain natriuretic peptide     Status: None   Collection Time: 07/20/21  1:04 AM  Result Value Ref Range   B Natriuretic Peptide 86.1 0.0 - 100.0 pg/mL    Comment: Performed at [facility] [address] [place], [place] [address]  Hemoglobin A1c     Status: Abnormal   Collection Time: 07/20/21  1:04 AM  Result Value Ref Range   Hgb A1c MFr Bld 12.0 (H) 4.8  - 5.6 %    Comment: (NOTE) Pre diabetes:          5.7%-6.4%  Diabetes:              >6.4%  Glycemic control for   <7.0% adults with diabetes    Mean Plasma Glucose 297.7 mg/dL    Comment: Performed at [facility] [address], [place], [place] [address]  Troponin I (High Sensitivity)     Status: Abnormal   Collection Time: 07/20/21  3:27 AM  Result Value Ref Range   Troponin I (High Sensitivity) 18 (H) <18 ng/L    Comment: (NOTE) Elevated high sensitivity troponin I (hsTnI) values and significant  changes across serial measurements may suggest ACS but many other  chronic and acute conditions are known to elevate hsTnI results.  Refer to the "Links" section for chest pain algorithms and additional  guidance. Performed at [facility], [place] [address] [place], [place] [address]   Urinalysis, Routine w reflex microscopic Urine, Clean Catch     Status: Abnormal   Collection Time: 07/20/21  9:58 AM  Result Value Ref Range   Color, Urine STRAW (A) YELLOW   APPearance CLEAR CLEAR   Specific Gravity, Urine 1.014 1.005 - 1.030   pH 6.0 5.0 - 8.0   Glucose, UA >=500 (A) NEGATIVE mg/dL   Hgb urine dipstick NEGATIVE NEGATIVE   Bilirubin Urine NEGATIVE NEGATIVE   Ketones, ur NEGATIVE NEGATIVE mg/dL   Protein, ur 30 (A) NEGATIVE mg/dL   Nitrite NEGATIVE NEGATIVE   Leukocytes,Ua  TRACE (A) NEGATIVE   RBC / HPF 0-5 0 - 5 RBC/hpf   WBC, UA 6-10 0 - 5 WBC/hpf   Bacteria, UA RARE (A) NONE SEEN   Squamous Epithelial / LPF 0-5 0 - 5   Budding Yeast PRESENT    Non Squamous Epithelial 0-5 (A) NONE SEEN    Comment: Performed at [facility], [place] [address], [place], [place] [address]  Resp Panel by RT-PCR (Flu A&B, Covid) Nasopharyngeal Swab     Status: None   Collection Time: 07/20/21 10:33 AM   Specimen: Nasopharyngeal Swab; Nasopharyngeal(NP) swabs in vial transport medium  Result Value Ref Range   SARS Coronavirus 2 by RT PCR NEGATIVE NEGATIVE    Comment:  (NOTE) SARS-CoV-2 target nucleic acids are NOT DETECTED.  The SARS-CoV-2 RNA is generally detectable in upper respiratory specimens during the acute phase of infection. The lowest concentration of SARS-CoV-2 viral copies this assay can detect is 138 copies/mL. A negative result does not preclude SARS-Cov-2 infection and should not be used as the sole basis for treatment or other patient management decisions. A negative result may occur with  improper specimen collection/handling, submission of specimen other than nasopharyngeal swab, presence of viral mutation(s) within the areas targeted by this assay, and inadequate number of viral copies(<138 copies/mL). A negative result must be combined with clinical observations, patient history, and epidemiological information. The expected result is Negative.  Fact Sheet for Patients:  EntrepreneurPulse.com.au  Fact Sheet for Healthcare Providers:  IncredibleEmployment.be  This test is no t yet approved or cleared by the [place] FDA and  has been authorized for detection and/or diagnosis of SARS-CoV-2 by FDA under an Emergency Use Authorization (EUA). This EUA will remain  in effect (meaning this test can be used) for the duration of the COVID-19 declaration under Section 564(b)(1) of the Act, 21 U.S.C.section 360bbb-3(b)(1), unless the authorization is terminated  or revoked sooner.       Influenza A by PCR NEGATIVE NEGATIVE   Influenza B by PCR NEGATIVE NEGATIVE    Comment: (NOTE) The Xpert Xpress SARS-CoV-2/FLU/RSV plus assay is intended as an aid in the diagnosis of influenza from Nasopharyngeal swab specimens and should not be used as a sole basis for treatment. Nasal washings and aspirates are unacceptable for Xpert Xpress SARS-CoV-2/FLU/RSV testing.  Fact Sheet for Patients: EntrepreneurPulse.com.au  Fact Sheet for Healthcare  Providers: IncredibleEmployment.be  This test is not yet approved or cleared by the [place] FDA and has been authorized for detection and/or diagnosis of SARS-CoV-2 by FDA under an Emergency Use Authorization (EUA). This EUA will remain in effect (meaning this test can be used) for the duration of the COVID-19 declaration under Section 564(b)(1) of the Act, 21 U.S.C. section 360bbb-3(b)(1), unless the authorization is terminated or revoked.  Performed at [facility], [place] [address], [place], [place] [address]   Troponin I (High Sensitivity)     Status: Abnormal   Collection Time: 07/20/21 12:32 PM  Result Value Ref Range   Troponin I (High Sensitivity) 33 (H) <18 ng/L    Comment: (NOTE) Elevated high sensitivity troponin I (hsTnI) values and significant  changes across serial measurements may suggest ACS but many other  chronic and acute conditions are known to elevate hsTnI results.  Refer to the "Links" section for chest pain algorithms and additional  guidance. Performed at [facility], [place] [address] [place], [place] [address]   Lipid panel     Status: Abnormal  Collection Time: 07/20/21 12:32 PM  Result Value Ref Range   Cholesterol 148 0 - 200 mg/dL   Triglycerides 186 (H) <150 mg/dL   HDL 55 >40 mg/dL   Total CHOL/HDL Ratio 2.7 RATIO   VLDL 37 0 - 40 mg/dL   LDL Cholesterol 56 0 - 99 mg/dL    Comment:        Total Cholesterol/HDL:CHD Risk Coronary Heart Disease Risk Table                     Men   Women  1/2 Average Risk   3.4   3.3  Average Risk       5.0   4.4  2 X Average Risk   9.6   7.1  3 X Average Risk  23.4   11.0        Use the calculated Patient Ratio above and the CHD Risk Table to determine the patient's CHD Risk.        ATP III CLASSIFICATION (LDL):  <100     mg/dL   Optimal  100-129  mg/dL   Near or Above                    Optimal  130-159  mg/dL   Borderline  160-189  mg/dL   High  >190     mg/dL    Very High Performed at [facility] [address] [place], [place] [address]   CBG monitoring, ED     Status: Abnormal   Collection Time: 07/20/21  4:30 PM  Result Value Ref Range   Glucose-Capillary 344 (H) 70 - 99 mg/dL    Comment: Glucose reference range applies only to samples taken after fasting for at least 8 hours.  Protime-INR     Status: None   Collection Time: 07/20/21  4:51 PM  Result Value Ref Range   Prothrombin Time 14.1 11.4 - 15.2 seconds   INR 1.1 0.8 - 1.2    Comment: (NOTE) INR goal varies based on device and disease states. Performed at [facility], [place] [address], [place], [place] [address]   Heparin level (unfractionated)     Status: Abnormal   Collection Time: 07/20/21  4:52 PM  Result Value Ref Range   Heparin Unfractionated <0.10 (L) 0.30 - 0.70 IU/mL    Comment: (NOTE) The clinical reportable range upper limit is being lowered to >1.10 to align with the FDA approved guidance for the current laboratory assay.  If heparin results are below expected values, and patient dosage has  been confirmed, suggest follow up testing of antithrombin III levels. Performed at [facility], [place] [address], [place], [place] [address]   Glucose, capillary     Status: Abnormal   Collection Time: 07/20/21  9:18 PM  Result Value Ref Range   Glucose-Capillary 315 (H) 70 - 99 mg/dL    Comment: Glucose reference range applies only to samples taken after fasting for at least 8 hours.   Comment 1 Notify RN    Comment 2 Document in Chart   HIV Antibody (routine testing w rflx)     Status: None   Collection Time: 07/20/21 10:21 PM  Result Value Ref Range   HIV Screen 4th Generation wRfx Non Reactive Non Reactive    Comment: Performed at [facility], [place] [address] [place], [place] [address]  Parathyroid hormone, intact (no Ca)     Status: None  Collection Time: 07/20/21 10:21 PM  Result Value Ref Range   PTH 16 15 - 65 pg/mL    Comment:  (NOTE) Performed At: [facility] [place], [place] [id number] Rush Farmer MD [id number]   Troponin I (High Sensitivity)     Status: Abnormal   Collection Time: 07/20/21 10:21 PM  Result Value Ref Range   Troponin I (High Sensitivity) 27 (H) <18 ng/L    Comment: (NOTE) Elevated high sensitivity troponin I (hsTnI) values and significant  changes across serial measurements may suggest ACS but many other  chronic and acute conditions are known to elevate hsTnI results.  Refer to the "Links" section for chest pain algorithms and additional  guidance. Performed at [facility], [place] [address], [place], [place] [address]   Heparin level (unfractionated)     Status: Abnormal   Collection Time: 07/20/21 10:21 PM  Result Value Ref Range   Heparin Unfractionated 0.27 (L) 0.30 - 0.70 IU/mL    Comment: (NOTE) The clinical reportable range upper limit is being lowered to >1.10 to align with the FDA approved guidance for the current laboratory assay.  If heparin results are below expected values, and patient dosage has  been confirmed, suggest follow up testing of antithrombin III levels. Performed at [facility], [place] [address] [place], [place] [address]   Basic metabolic panel     Status: Abnormal   Collection Time: 07/21/21  3:56 AM  Result Value Ref Range   Sodium 136 135 - 145 mmol/L   Potassium 4.1 3.5 - 5.1 mmol/L   Chloride 103 98 - 111 mmol/L   CO2 25 22 - 32 mmol/L   Glucose, Bld 319 (H) 70 - 99 mg/dL    Comment: Glucose reference range applies only to samples taken after fasting for at least 8 hours.   BUN 19 6 - 20 mg/dL   Creatinine, Ser 1.22 (H) 0.44 - 1.00 mg/dL   Calcium 9.6 8.9 - 10.3 mg/dL   GFR, Estimated 51 (L) >60 mL/min    Comment: (NOTE) Calculated using the CKD-EPI Creatinine Equation (2021)    Anion gap 8 5 - 15    Comment: Performed at [facility] [address], [place], [place] [address]  CBC     Status:  Abnormal   Collection Time: 07/21/21  3:56 AM  Result Value Ref Range   WBC 7.1 4.0 - 10.5 K/uL   RBC 3.60 (L) 3.87 - 5.11 MIL/uL   Hemoglobin 10.4 (L) 12.0 - 15.0 g/dL   HCT 32.1 (L) 36.0 - 46.0 %   MCV 89.2 80.0 - 100.0 fL   MCH 28.9 26.0 - 34.0 pg   MCHC 32.4 30.0 - 36.0 g/dL   RDW 13.3 11.5 - 15.5 %   Platelets 229 150 - 400 K/uL   nRBC 0.0 0.0 - 0.2 %    Comment: Performed at [facility], [place] [address], [place], [place] [address]  Troponin I (High Sensitivity)     Status: Abnormal   Collection Time: 07/21/21  3:56 AM  Result Value Ref Range   Troponin I (High Sensitivity) 22 (H) <18 ng/L    Comment: (NOTE) Elevated high sensitivity troponin I (hsTnI) values and significant  changes across serial measurements may suggest ACS but many other  chronic and acute conditions are known to elevate hsTnI results.  Refer to the "Links" section for chest pain algorithms and additional  guidance. Performed at [facility], [place] [address] [place], [place] [address]   Heparin level (unfractionated)     Status: Abnormal   Collection Time: 07/21/21  5:45 AM  Result Value Ref Range   Heparin Unfractionated 0.22 (L) 0.30 - 0.70 IU/mL    Comment: (NOTE) The clinical reportable range upper limit is being lowered to >1.10 to align with the FDA approved guidance for the current laboratory assay.  If heparin results are below expected values, and patient dosage has  been confirmed, suggest follow up testing of antithrombin III levels. Performed at [facility], [place] [address] [place], [place] [address]   Glucose, capillary     Status: Abnormal   Collection Time: 07/21/21  6:10 AM  Result Value Ref Range   Glucose-Capillary 281 (H) 70 - 99 mg/dL    Comment: Glucose reference range applies only to samples taken after fasting for at least 8 hours.   Comment 1 Notify RN    Comment 2 Document in Chart   Glucose, capillary     Status: Abnormal   Collection Time: 07/21/21  11:54 AM  Result Value Ref Range   Glucose-Capillary 213 (H) 70 - 99 mg/dL    Comment: Glucose reference range applies only to samples taken after fasting for at least 8 hours.  Microalbumin / creatinine urine ratio     Status: Abnormal   Collection Time: 07/21/21 12:10 PM  Result Value Ref Range   Microalb, Ur 293.5 (H) Not Estab. ug/mL   Microalb Creat Ratio 258 (H) 0 - 29 mg/g creat    Comment: (NOTE)                       Normal:                0 -  29                       Moderately increased: 30 - 300                       Severely increased:       >300 Performed At: [facility] [place] [address] [place], [place] [id number] Rush Farmer MD [id number]    Creatinine, Urine 113.9 Not Estab. mg/dL  Protein / creatinine ratio, urine     Status: Abnormal   Collection Time: 07/21/21 12:10 PM  Result Value Ref Range   Creatinine, Urine 131.66 mg/dL   Total Protein, Urine 61 mg/dL    Comment: NO NORMAL RANGE ESTABLISHED FOR THIS TEST   Protein Creatinine Ratio 0.46 (H) 0.00 - 0.15 mg/mg[Cre]    Comment: Performed at [facility] [address] [place], [place] [address]  Heparin level (unfractionated)     Status: None   Collection Time: 07/21/21  1:25 PM  Result Value Ref Range   Heparin Unfractionated 0.49 0.30 - 0.70 IU/mL    Comment: (NOTE) The clinical reportable range upper limit is being lowered to >1.10 to align with the FDA approved guidance for the current laboratory assay.  If heparin results are below expected values, and patient dosage has  been confirmed, suggest follow up testing of antithrombin III levels. Performed at [facility], [place] [address], [place], [place] [address]   Glucose, capillary     Status: Abnormal   Collection Time: 07/21/21  4:58 PM  Result Value Ref Range   Glucose-Capillary 157 (H) 70 - 99  mg/dL    Comment: Glucose reference range applies only to samples taken after fasting for at least 8 hours.  Glucose,  capillary     Status: Abnormal   Collection Time: 07/21/21  9:04 PM  Result Value Ref Range   Glucose-Capillary 249 (H) 70 - 99 mg/dL    Comment: Glucose reference range applies only to samples taken after fasting for at least 8 hours.  CBC     Status: Abnormal   Collection Time: 07/22/21  4:39 AM  Result Value Ref Range   WBC 7.0 4.0 - 10.5 K/uL   RBC 3.58 (L) 3.87 - 5.11 MIL/uL   Hemoglobin 10.5 (L) 12.0 - 15.0 g/dL   HCT 32.3 (L) 36.0 - 46.0 %   MCV 90.2 80.0 - 100.0 fL   MCH 29.3 26.0 - 34.0 pg   MCHC 32.5 30.0 - 36.0 g/dL   RDW 13.4 11.5 - 15.5 %   Platelets 225 150 - 400 K/uL   nRBC 0.0 0.0 - 0.2 %    Comment: Performed at [facility], [place] [address], [place], [place] [address]  Basic metabolic panel     Status: Abnormal   Collection Time: 07/22/21  4:39 AM  Result Value Ref Range   Sodium 136 135 - 145 mmol/L   Potassium 4.7 3.5 - 5.1 mmol/L   Chloride 107 98 - 111 mmol/L   CO2 23 22 - 32 mmol/L   Glucose, Bld 296 (H) 70 - 99 mg/dL    Comment: Glucose reference range applies only to samples taken after fasting for at least 8 hours.   BUN 15 6 - 20 mg/dL   Creatinine, Ser 1.09 (H) 0.44 - 1.00 mg/dL   Calcium 9.0 8.9 - 10.3 mg/dL   GFR, Estimated 58 (L) >60 mL/min    Comment: (NOTE) Calculated using the CKD-EPI Creatinine Equation (2021)    Anion gap 6 5 - 15    Comment: Performed at [facility] [address] [place], [place] [address]  Glucose, capillary     Status: Abnormal   Collection Time: 07/22/21  5:55 AM  Result Value Ref Range   Glucose-Capillary 270 (H) 70 - 99 mg/dL    Comment: Glucose reference range applies only to samples taken after fasting for at least 8 hours.  CUP PACEART REMOTE DEVICE CHECK     Status: None   Collection Time: 09/19/21  2:00 AM  Result Value Ref Range   Date Time Interrogation Session [id number]    Pulse Generator Manufacturer OTHER    Pulse Gen Model [id number] [place] [address]    Pulse Gen Serial Number [id number]     Clinic Name [facility]    Implantable Pulse Generator Type Implantable Cardiac Defibulator    Implantable Pulse Generator Implant Date [id number]    Implantable Lead Manufacturer [facility]    Implantable Lead Model 7122Q [place] SJ4    Implantable Lead Serial Number [id number]    Implantable Lead Implant Date 19622297    Implantable Lead Location Detail 1 UNKNOWN    Implantable Lead Location [id number]    Implantable Lead Manufacturer [facility]    Implantable Lead Model [id number] Tendril MRI    Implantable Lead Serial Number [id number]    Implantable Lead Implant Date [id number]    Implantable Lead Location Detail 1 UNKNOWN    Implantable Lead Location [id number]    Lead Channel Setting Sensing Sensitivity 0.5 mV   Lead Channel Setting Sensing Adaptation Mode Adaptive Sensing  Lead Channel Setting Pacing Amplitude 2.0 V   Lead Channel Setting Pacing Pulse Width 0.5 ms   Lead Channel Setting Pacing Amplitude 2.5 V   Lead Channel Status NULL    Lead Channel Impedance Value 360 ohm   Lead Channel Sensing Intrinsic Amplitude 4.6 mV   Lead Channel Pacing Threshold Amplitude 0.75 V   Lead Channel Pacing Threshold Pulse Width 0.5 ms   Lead Channel Status NULL    Lead Channel Impedance Value 450 ohm   Lead Channel Sensing Intrinsic Amplitude 12.0 mV   Lead Channel Pacing Threshold Amplitude 1.0 V   Lead Channel Pacing Threshold Pulse Width 0.5 ms   HighPow Impedance 62 ohm   HighPow Imped Status NULL    Battery Status MOS    Battery Remaining Longevity 104 mo   Battery Remaining Percentage 91.0 %   Battery Voltage 3.02 V   Brady Statistic RA Percent Paced 4.5 %   Brady Statistic RV Percent Paced 1.0 %   Brady Statistic AP VP Percent 1.0 %   Brady Statistic AS VP Percent 1.0 %   Brady Statistic AP VS Percent 4.5 %   Brady Statistic AS VS Percent 95.0 %    Objective: There were no vitals filed for this visit.  General: Patient is awake, alert, oriented x 3 and in no acute  distress.  Dermatology: Skin is warm and dry bilateral eschar to the wounds of the dorsal and plantar left hallux there is significant cyanosis and blue discoloration and cold temperature to the hallux with streaking cellulitis to the level of the ankle, progressive signs of infection.  There are fissures in the heel worse on the left heel with no surrounding signs of infection.   Vascular: Dorsalis Pedis pulse = 1/4 Bilateral,  Posterior Tibial pulse = 1/4 Bilateral,  Capillary Fill Time < 5 seconds, cold temperature to the left hallux with significant purple discoloration to the level of the metatarsophalangeal joint.  Neurologic: Protective sensation absent bilateral.  Musculosketal: There is no pain to palpation to the ulcerated area at the left plantar or dorsal hallux.  Patient is status post right hallux amputation.   No results for input(s): GRAMSTAIN, LABORGA in the last 8760 hours.  Assessment and Plan:  Problem List Items Addressed This Visit       Endocrine   Uncontrolled type 2 diabetes mellitus   Other Visit Diagnoses     Toe ulcer, left, limited to breakdown of skin ([place])    -  Primary   Relevant Orders   DG Foot Complete Left   Cellulitis and abscess of toe of left foot       Toe cyanosis           -Examined patient and discussed the progression of the wound and treatment alternatives. -X-rays reviewed no obvious osteomyelitis at the left hallux -There is significant cellulitis to the level of the ankle with streaking and significant cyanosis of entire great toe no active drainage from the dorsal or plantar hallux wound -Applied Betadine and dry dressing to the left foot -Patient sent to [facility] for admission for left great toe infection and constitutional symptoms concerning for sepsis -Called and discussed case with PA for direct admission to hospitalist service with podiatry consult to follow.  Made patient well aware that she will need MRI for  surgery planning likely will need amputation of the toe since there is cyanosis.  Landis Martins, DPM

## 2021-10-04 ENCOUNTER — Encounter: Payer: Self-pay | Admitting: Sports Medicine

## 2021-10-04 DIAGNOSIS — E1165 Type 2 diabetes mellitus with hyperglycemia: Secondary | ICD-10-CM

## 2021-10-04 DIAGNOSIS — L97523 Non-pressure chronic ulcer of other part of left foot with necrosis of muscle: Secondary | ICD-10-CM

## 2021-10-04 DIAGNOSIS — L089 Local infection of the skin and subcutaneous tissue, unspecified: Secondary | ICD-10-CM

## 2021-10-06 ENCOUNTER — Encounter: Payer: Self-pay | Admitting: Sports Medicine

## 2021-10-09 ENCOUNTER — Emergency Department (HOSPITAL_COMMUNITY): Payer: Managed Care, Other (non HMO)

## 2021-10-09 ENCOUNTER — Inpatient Hospital Stay (HOSPITAL_COMMUNITY)
Admission: EM | Admit: 2021-10-09 | Discharge: 2021-10-14 | DRG: 982 | Disposition: A | Discharge status: Home / Self Care | Payer: Managed Care, Other (non HMO) | Source: Ambulatory Visit | Attending: Family Medicine | Admitting: Family Medicine

## 2021-10-09 ENCOUNTER — Telehealth: Payer: Self-pay | Admitting: Sports Medicine

## 2021-10-09 ENCOUNTER — Inpatient Hospital Stay (HOSPITAL_COMMUNITY): Payer: Managed Care, Other (non HMO)

## 2021-10-09 ENCOUNTER — Other Ambulatory Visit: Payer: Self-pay

## 2021-10-09 DIAGNOSIS — I10 Essential (primary) hypertension: Secondary | ICD-10-CM

## 2021-10-09 DIAGNOSIS — Z20822 Contact with and (suspected) exposure to covid-19: Secondary | ICD-10-CM | POA: Diagnosis present

## 2021-10-09 DIAGNOSIS — I472 Ventricular tachycardia, unspecified: Secondary | ICD-10-CM | POA: Diagnosis present

## 2021-10-09 DIAGNOSIS — Z820 Family history of epilepsy and other diseases of the nervous system: Secondary | ICD-10-CM

## 2021-10-09 DIAGNOSIS — Z83438 Family history of other disorder of lipoprotein metabolism and other lipidemia: Secondary | ICD-10-CM

## 2021-10-09 DIAGNOSIS — D75839 Thrombocytosis, unspecified: Secondary | ICD-10-CM | POA: Diagnosis present

## 2021-10-09 DIAGNOSIS — E11621 Type 2 diabetes mellitus with foot ulcer: Secondary | ICD-10-CM | POA: Diagnosis not present

## 2021-10-09 DIAGNOSIS — I5032 Chronic diastolic (congestive) heart failure: Secondary | ICD-10-CM | POA: Diagnosis present

## 2021-10-09 DIAGNOSIS — I110 Hypertensive heart disease with heart failure: Secondary | ICD-10-CM | POA: Diagnosis present

## 2021-10-09 DIAGNOSIS — I252 Old myocardial infarction: Secondary | ICD-10-CM

## 2021-10-09 DIAGNOSIS — D631 Anemia in chronic kidney disease: Secondary | ICD-10-CM | POA: Diagnosis present

## 2021-10-09 DIAGNOSIS — I2510 Atherosclerotic heart disease of native coronary artery without angina pectoris: Secondary | ICD-10-CM | POA: Diagnosis present

## 2021-10-09 DIAGNOSIS — K219 Gastro-esophageal reflux disease without esophagitis: Secondary | ICD-10-CM | POA: Diagnosis present

## 2021-10-09 DIAGNOSIS — Z79899 Other long term (current) drug therapy: Secondary | ICD-10-CM | POA: Diagnosis not present

## 2021-10-09 DIAGNOSIS — Z792 Long term (current) use of antibiotics: Secondary | ICD-10-CM

## 2021-10-09 DIAGNOSIS — T8149XA Infection following a procedure, other surgical site, initial encounter: Secondary | ICD-10-CM

## 2021-10-09 DIAGNOSIS — Z87891 Personal history of nicotine dependence: Secondary | ICD-10-CM

## 2021-10-09 DIAGNOSIS — E785 Hyperlipidemia, unspecified: Secondary | ICD-10-CM | POA: Diagnosis present

## 2021-10-09 DIAGNOSIS — E1142 Type 2 diabetes mellitus with diabetic polyneuropathy: Secondary | ICD-10-CM | POA: Diagnosis present

## 2021-10-09 DIAGNOSIS — E1152 Type 2 diabetes mellitus with diabetic peripheral angiopathy with gangrene: Secondary | ICD-10-CM | POA: Diagnosis present

## 2021-10-09 DIAGNOSIS — Z9862 Peripheral vascular angioplasty status: Secondary | ICD-10-CM | POA: Diagnosis not present

## 2021-10-09 DIAGNOSIS — Z23 Encounter for immunization: Secondary | ICD-10-CM

## 2021-10-09 DIAGNOSIS — Z8249 Family history of ischemic heart disease and other diseases of the circulatory system: Secondary | ICD-10-CM | POA: Diagnosis not present

## 2021-10-09 DIAGNOSIS — T8744 Infection of amputation stump, left lower extremity: Secondary | ICD-10-CM | POA: Diagnosis not present

## 2021-10-09 DIAGNOSIS — Z951 Presence of aortocoronary bypass graft: Secondary | ICD-10-CM

## 2021-10-09 DIAGNOSIS — Z7902 Long term (current) use of antithrombotics/antiplatelets: Secondary | ICD-10-CM

## 2021-10-09 DIAGNOSIS — E119 Type 2 diabetes mellitus without complications: Secondary | ICD-10-CM | POA: Diagnosis not present

## 2021-10-09 DIAGNOSIS — I70262 Atherosclerosis of native arteries of extremities with gangrene, left leg: Secondary | ICD-10-CM | POA: Diagnosis present

## 2021-10-09 DIAGNOSIS — E871 Hypo-osmolality and hyponatremia: Secondary | ICD-10-CM | POA: Diagnosis present

## 2021-10-09 DIAGNOSIS — Z95810 Presence of automatic (implantable) cardiac defibrillator: Secondary | ICD-10-CM | POA: Diagnosis present

## 2021-10-09 DIAGNOSIS — T8754 Necrosis of amputation stump, left lower extremity: Secondary | ICD-10-CM | POA: Diagnosis present

## 2021-10-09 DIAGNOSIS — Z881 Allergy status to other antibiotic agents status: Secondary | ICD-10-CM

## 2021-10-09 DIAGNOSIS — Z794 Long term (current) use of insulin: Secondary | ICD-10-CM

## 2021-10-09 DIAGNOSIS — L97509 Non-pressure chronic ulcer of other part of unspecified foot with unspecified severity: Secondary | ICD-10-CM | POA: Diagnosis not present

## 2021-10-09 DIAGNOSIS — Z955 Presence of coronary angioplasty implant and graft: Secondary | ICD-10-CM

## 2021-10-09 DIAGNOSIS — E1140 Type 2 diabetes mellitus with diabetic neuropathy, unspecified: Secondary | ICD-10-CM | POA: Diagnosis present

## 2021-10-09 DIAGNOSIS — Z7984 Long term (current) use of oral hypoglycemic drugs: Secondary | ICD-10-CM | POA: Diagnosis not present

## 2021-10-09 DIAGNOSIS — T8140XA Infection following a procedure, unspecified, initial encounter: Secondary | ICD-10-CM

## 2021-10-09 DIAGNOSIS — Y835 Amputation of limb(s) as the cause of abnormal reaction of the patient, or of later complication, without mention of misadventure at the time of the procedure: Secondary | ICD-10-CM | POA: Diagnosis present

## 2021-10-09 DIAGNOSIS — J439 Emphysema, unspecified: Secondary | ICD-10-CM | POA: Diagnosis present

## 2021-10-09 DIAGNOSIS — Z7982 Long term (current) use of aspirin: Secondary | ICD-10-CM

## 2021-10-09 DIAGNOSIS — Z9889 Other specified postprocedural states: Secondary | ICD-10-CM | POA: Diagnosis not present

## 2021-10-09 DIAGNOSIS — Z888 Allergy status to other drugs, medicaments and biological substances status: Secondary | ICD-10-CM

## 2021-10-09 DIAGNOSIS — L03116 Cellulitis of left lower limb: Secondary | ICD-10-CM | POA: Diagnosis present

## 2021-10-09 DIAGNOSIS — Z885 Allergy status to narcotic agent status: Secondary | ICD-10-CM

## 2021-10-09 DIAGNOSIS — M79605 Pain in left leg: Secondary | ICD-10-CM | POA: Diagnosis not present

## 2021-10-09 HISTORY — DX: Infection following a procedure, other surgical site, initial encounter: T81.49XA

## 2021-10-09 LAB — RESP PANEL BY RT-PCR (FLU A&B, COVID) ARPGX2
Influenza A by PCR: NEGATIVE
Influenza B by PCR: NEGATIVE
SARS Coronavirus 2 by RT PCR: NEGATIVE

## 2021-10-09 LAB — COMPREHENSIVE METABOLIC PANEL
ALT: 16 U/L (ref 0–44)
AST: 24 U/L (ref 15–41)
Albumin: 2.3 g/dL — ABNORMAL LOW (ref 3.5–5.0)
Alkaline Phosphatase: 53 U/L (ref 38–126)
Anion gap: 11 (ref 5–15)
BUN: 11 mg/dL (ref 8–23)
CO2: 22 mmol/L (ref 22–32)
Calcium: 9.1 mg/dL (ref 8.9–10.3)
Chloride: 101 mmol/L (ref 98–111)
Creatinine, Ser: 0.9 mg/dL (ref 0.44–1.00)
GFR, Estimated: >60 mL/min (ref >60)
Glucose, Bld: 166 mg/dL — ABNORMAL HIGH (ref 70–99)
Potassium: 4.2 mmol/L (ref 3.5–5.1)
Sodium: 134 mmol/L — ABNORMAL LOW (ref 135–145)
Total Bilirubin: 0.5 mg/dL (ref 0.3–1.2)
Total Protein: 7.4 g/dL (ref 6.5–8.1)

## 2021-10-09 LAB — CBC WITH DIFFERENTIAL/PLATELET
Abs Immature Granulocytes: 0.3 10*3/uL — ABNORMAL HIGH (ref 0.00–0.07)
Basophils Absolute: 0 10*3/uL (ref 0.0–0.1)
Basophils Relative: 0 %
Eosinophils Absolute: 0.1 10*3/uL (ref 0.0–0.5)
Eosinophils Relative: 1 %
HCT: 33.1 % — ABNORMAL LOW (ref 36.0–46.0)
Hemoglobin: 10.2 g/dL — ABNORMAL LOW (ref 12.0–15.0)
Lymphocytes Relative: 13 %
Lymphs Abs: 1.9 10*3/uL (ref 0.7–4.0)
MCH: 27.2 pg (ref 26.0–34.0)
MCHC: 30.8 g/dL (ref 30.0–36.0)
MCV: 88.3 fL (ref 80.0–100.0)
Monocytes Absolute: 0.4 10*3/uL (ref 0.1–1.0)
Monocytes Relative: 3 %
Myelocytes: 2 %
Neutro Abs: 11.9 10*3/uL — ABNORMAL HIGH (ref 1.7–7.7)
Neutrophils Relative %: 81 %
Platelets: 522 10*3/uL — ABNORMAL HIGH (ref 150–400)
RBC: 3.75 MIL/uL — ABNORMAL LOW (ref 3.87–5.11)
RDW: 13.2 % (ref 11.5–15.5)
WBC: 14.7 10*3/uL — ABNORMAL HIGH (ref 4.0–10.5)
nRBC: 0 % (ref 0.0–0.2)
nRBC: 1 /100 WBC — ABNORMAL HIGH

## 2021-10-09 LAB — CK: Total CK: 29 U/L — ABNORMAL LOW (ref 38–234)

## 2021-10-09 LAB — LACTIC ACID, PLASMA: Lactic Acid, Venous: 1.3 mmol/L (ref 0.5–1.9)

## 2021-10-09 LAB — CBG MONITORING, ED: Glucose-Capillary: 152 mg/dL — ABNORMAL HIGH (ref 70–99)

## 2021-10-09 MED ORDER — INSULIN ASPART 100 UNIT/ML IJ SOLN
0.0000 [IU] | INTRAMUSCULAR | Status: DC
  Administered 2021-10-10 (×3): 4 [IU] via SUBCUTANEOUS
  Administered 2021-10-11: 3 [IU] via SUBCUTANEOUS
  Administered 2021-10-11 – 2021-10-12 (×2): 4 [IU] via SUBCUTANEOUS
  Administered 2021-10-12: 3 [IU] via SUBCUTANEOUS
  Administered 2021-10-12 (×2): 4 [IU] via SUBCUTANEOUS
  Administered 2021-10-12: 7 [IU] via SUBCUTANEOUS
  Administered 2021-10-12: 3 [IU] via SUBCUTANEOUS
  Administered 2021-10-13: 7 [IU] via SUBCUTANEOUS
  Administered 2021-10-13 (×2): 4 [IU] via SUBCUTANEOUS
  Administered 2021-10-14: 7 [IU] via SUBCUTANEOUS
  Administered 2021-10-14: 4 [IU] via SUBCUTANEOUS

## 2021-10-09 MED ORDER — SODIUM CHLORIDE 0.9 % IV SOLN
4.0000 mg/kg | Freq: Once | INTRAVENOUS | Status: AC
  Administered 2021-10-09: 350 mg via INTRAVENOUS
  Filled 2021-10-09: qty 7

## 2021-10-09 MED ORDER — PIPERACILLIN-TAZOBACTAM 3.375 G IVPB
3.3750 g | Freq: Three times a day (TID) | INTRAVENOUS | Status: DC
  Administered 2021-10-10 – 2021-10-12 (×6): 3.375 g via INTRAVENOUS
  Filled 2021-10-09 (×9): qty 50

## 2021-10-09 MED ORDER — IOHEXOL 350 MG/ML SOLN
75.0000 mL | Freq: Once | INTRAVENOUS | Status: AC | PRN
  Administered 2021-10-09: 75 mL via INTRAVENOUS

## 2021-10-09 MED ORDER — PIPERACILLIN-TAZOBACTAM 3.375 G IVPB 30 MIN
3.3750 g | Freq: Once | INTRAVENOUS | Status: AC
  Administered 2021-10-09: 3.375 g via INTRAVENOUS
  Filled 2021-10-09: qty 50

## 2021-10-09 NOTE — ED Notes (Signed)
Pt transported to CT ?

## 2021-10-09 NOTE — ED Provider Notes (Signed)
[facility] EMERGENCY DEPARTMENT Provider Note   CSN: [id number] Arrival date & time: 10/09/21  1214     History Chief Complaint  Patient presents with   Post-op Problem    Jasmine Richardson is a 60 y.o. female.  Patient with history of diastolic heart failure, coronary artery disease, CABG, past tobacco use quit in 2017, uncontrolled diabetes, wound infection presents with worsening swelling and redness to her left foot.  Patient had to amputation due to diabetes and infection 2 days prior by Dr. Cannon Kettle.  Patient was advised to come in after calling them.  Patient denies fevers chills shortness of breath or vomiting.  Noticed worsening redness this morning.  Patient denies any peripheral vascular disease that is known or stents in her legs.  This was gradually worsening no sudden onset.      Past Medical History:  Diagnosis Date   Acute chest pain 05/08/2016   Angina pectoris ([place]) 09/11/2017   Burping 05/08/2016   Chronic diastolic heart failure ([place]) 11/06/2016   Coronary artery disease involving native coronary artery of native heart with angina pectoris ([place]) 09/12/2015   Overview:  PCI and stent of RCA 2009, last cath 2012 with medical therapy  CABG May 2017   Emphysema lung ([place]) 09/11/2017   Essential hypertension 09/12/2015   GERD (gastroesophageal reflux disease) 05/08/2016   Hyperlipidemia 09/12/2015   Mediastinitis 06/28/2016   Morbid obesity ([place]) 05/08/2016   S/P CABG (coronary artery bypass graft) 06/03/2016   Overview:  The patient underwent sternal reconstruction on 06/21/16 with pec flaps for mediastinitis from a prior CABG in May 2017. On admission, she was critically ill from sepsis and had altered mental status. She was last seen in clinic on 08/09/16 at which time she was doing well.   Severe sepsis ([place]) 06/03/2016   Sinus tachycardia 05/08/2016   Tobacco use disorder 04/20/2016   Overview:  Quit in May 2017.   Uncontrolled type 2 diabetes mellitus  05/08/2016   Wound, surgical, infected 06/06/2016   Overview:  sternal    Patient Active Problem List   Diagnosis Date Noted   Abnormal mammogram of left breast 09/04/2021   Breast wound, right, subsequent encounter 09/04/2021   Demand ischemia [facility])    ICD (implantable cardioverter-defibrillator) in place 05/16/2021   Ventricular tachycardia 12/17/2020   Unstable angina ([place]) 05/22/2020   NSTEMI (non-ST elevated myocardial infarction) ([place]) 05/22/2020   Diabetic foot ulcer ([place]) 02/16/2020   Angina pectoris ([place]) 09/11/2017   Emphysema lung ([place]) 09/11/2017   Chronic diastolic heart failure ([place]) 11/06/2016   Mediastinitis 06/28/2016   Wound, surgical, infected 06/06/2016   S/P CABG (coronary artery bypass graft) 06/03/2016   Severe sepsis ([place]) 06/03/2016   GERD (gastroesophageal reflux disease) 05/08/2016   Morbid obesity ([place]) 05/08/2016   Uncontrolled type 2 diabetes mellitus 05/08/2016   Sinus tachycardia 05/08/2016   Acute chest pain 05/08/2016   Burping 05/08/2016   Tobacco use disorder 04/20/2016   Coronary artery disease involving native coronary artery of native heart with angina pectoris ([place]) 09/12/2015   Essential hypertension 09/12/2015   Hyperlipidemia 09/12/2015    Past Surgical History:  Procedure Laterality Date   AMPUTATION TOE Right 02/20/2020   Procedure: AMPUTATION RIGHT GREAT  TOE;  Surgeon: Felipa Furnace, DPM;  Location: [place];  Service: Podiatry;  Laterality: Right;   BLADDER SURGERY     CARDIAC CATHETERIZATION     CORONARY ARTERY BYPASS GRAFT     CORONARY STENT INTERVENTION N/A 05/25/2020  Procedure: CORONARY STENT INTERVENTION;  Surgeon: Wellington Hampshire, MD;  Location: [place] CV LAB;  Service: Cardiovascular;  Laterality: N/A;   CORONARY STENT INTERVENTION N/A 06/22/2020   Procedure: CORONARY STENT INTERVENTION;  Surgeon: Leonie Man, MD;  Location: [place] CV LAB;  Service: Cardiovascular;  Laterality: N/A;   ICD IMPLANT N/A 12/19/2020    Procedure: ICD IMPLANT;  Surgeon: Evans Lance, MD;  Location: [place] CV LAB;  Service: Cardiovascular;  Laterality: N/A;   INCISION AND DRAINAGE Right 02/19/2020   Procedure: INCISION AND DRAINAGE;  Surgeon: Trula Slade, DPM;  Location: [place];  Service: Podiatry;  Laterality: Right;  Block done by surgeon   INTRAVASCULAR ULTRASOUND/IVUS N/A 06/22/2020   Procedure: Intravascular Ultrasound/IVUS;  Surgeon: Leonie Man, MD;  Location: [place] CV LAB;  Service: Cardiovascular;  Laterality: N/A;   LEFT HEART CATH AND CORS/GRAFTS ANGIOGRAPHY N/A 05/23/2020   Procedure: LEFT HEART CATH AND CORS/GRAFTS ANGIOGRAPHY;  Surgeon: Nelva Bush, MD;  Location: [place] CV LAB;  Service: Cardiovascular;  Laterality: N/A;   LEFT HEART CATH AND CORS/GRAFTS ANGIOGRAPHY N/A 12/19/2020   Procedure: LEFT HEART CATH AND CORS/GRAFTS ANGIOGRAPHY;  Surgeon: Burnell Blanks, MD;  Location: [place] CV LAB;  Service: Cardiovascular;  Laterality: N/A;   LEFT HEART CATH AND CORS/GRAFTS ANGIOGRAPHY N/A 07/21/2021   Procedure: LEFT HEART CATH AND CORS/GRAFTS ANGIOGRAPHY;  Surgeon: Nelva Bush, MD;  Location: [place] CV LAB;  Service: Cardiovascular;  Laterality: N/A;   METATARSAL HEAD EXCISION Right 05/02/2020   Procedure: FIRST METATARSAL HEAD RESECTION; RIGHT FOOT WOUND CLOSURE;  Surgeon: Landis Martins, DPM;  Location: [place];  Service: Podiatry;  Laterality: Right;  MAC W/LOCAL   TUBAL LIGATION       OB History   No obstetric history on file.     Family History  Problem Relation Age of Onset   Hypertension Mother    Hyperlipidemia Mother    Heart attack Father    Heart disease Father    Hypertension Father    Alzheimer's disease Father    Hypertension Brother    Hyperlipidemia Brother     Social History   Tobacco Use   Smoking status: Former    Types: Cigarettes    Quit date: 05/2016    Years since quitting: 5.4   Smokeless tobacco: Never   Vaping Use   Vaping Use: Never used  Substance Use Topics   Alcohol use: No   Drug use: No    Home Medications Prior to Admission medications   Medication Sig Start Date End Date Taking? Authorizing Provider  amiodarone (PACERONE) 200 MG tablet Take 1 tablet (200 mg total) by mouth daily. 07/22/21 10/20/21  Timothy Lasso, MD  amiodarone (PACERONE) 200 MG tablet Take by mouth. 07/22/21 10/20/21  [provider]  amoxicillin-clavulanate (AUGMENTIN) 875-125 MG tablet Take 1 tablet by mouth 2 (two) times daily. 08/15/21   [provider]  aspirin 81 MG chewable tablet Chew by mouth.    [provider]  aspirin EC 81 MG tablet Take 1 tablet (81 mg total) by mouth daily. 07/22/21 10/20/21  Timothy Lasso, MD  clindamycin (CLEOCIN) 300 MG capsule Take 300 mg by mouth 3 (three) times daily. 08/16/21   [provider]  clopidogrel (PLAVIX) 75 MG tablet Take 1 tablet (75 mg total) by mouth daily. 07/22/21 10/20/21  Timothy Lasso, MD  cyanocobalamin 1000 MCG tablet Take by mouth.    [provider]  dapagliflozin propanediol (  FARXIGA) 10 MG TABS tablet Take 1 tablet (10 mg total) by mouth daily before breakfast. 07/22/21 10/20/21  Timothy Lasso, MD  Evolocumab (REPATHA SURECLICK) 960 MG/ML SOAJ Inject 140 mg into the skin every 14 (fourteen) days. 08/23/21   Richardo Priest, MD  fenofibrate (TRICOR) 145 MG tablet Take 1 tablet (145 mg total) by mouth daily. 07/22/21 10/20/21  Timothy Lasso, MD  fluconazole (DIFLUCAN) 150 MG tablet Take 150 mg by mouth every 3 (three) days. 08/15/21   [provider]  furosemide (LASIX) 40 MG tablet Take 1 tablet (40 mg total) by mouth daily. 07/22/21 10/20/21  Timothy Lasso, MD  glipiZIDE (GLUCOTROL) 10 MG tablet Take by mouth.    [provider]  HUMALOG KWIKPEN 100 UNIT/ML KwikPen INJECT SUBCUTANEOUSLY BEFORE BREAKFAST, LUNCH, AND SUPPER PER SLIDING SCALE AS FOLLOWS: 3 UNITS IF SUGARS 120-150, 5 UNITS IF 151-180,  7 UNITS IF 181-240, 9 UNITS IF 241-300, 12 UNITS IF 301-350, 15 UNITS BEYOND 351, CALL MD IF OVER 400. MAX DAILY DOSE 50 09/09/21   [provider]  HYDROcodone-acetaminophen (NORCO/VICODIN) 5-325 MG tablet Take by mouth.    [provider]  isosorbide mononitrate (IMDUR) 30 MG 24 hr tablet Take 1 tablet (30 mg total) by mouth daily. 07/22/21 08/29/21  Timothy Lasso, MD  LORazepam (ATIVAN) 1 MG tablet Take by mouth.    [provider]  metFORMIN (GLUCOPHAGE) 500 MG tablet Take 1 tablet (500 mg total) by mouth 2 (two) times daily. 07/22/21 10/20/21  Timothy Lasso, MD  metFORMIN (GLUCOPHAGE) 500 MG tablet Take by mouth. 07/22/21 10/20/21  [provider]  metoprolol succinate (TOPROL XL) 25 MG 24 hr tablet Take 1 tablet (25 mg total) by mouth 3 (three) times daily. 07/22/21 08/29/21  Sanjuan Dame, MD  nitroGLYCERIN (NITROSTAT) 0.4 MG SL tablet Place 1 tablet (0.4 mg total) under the tongue every 5 (five) minutes as needed for chest pain. 07/22/21 10/20/21  Timothy Lasso, MD  nystatin (MYCOSTATIN/NYSTOP) powder Apply 1 application topically daily.    [provider]  omeprazole (PRILOSEC) 20 MG capsule Take 1 capsule (20 mg total) by mouth daily. 07/22/21 10/20/21  Timothy Lasso, MD  rosuvastatin (CRESTOR) 20 MG tablet Take 1 tablet (20 mg total) by mouth at bedtime. 07/22/21 10/20/21  Timothy Lasso, MD  TRESIBA FLEXTOUCH 100 UNIT/ML FlexTouch Pen Inject 45 Units into the skin daily. 07/22/21 10/20/21  Timothy Lasso, MD  valsartan (DIOVAN) 80 MG tablet Take 1 tablet (80 mg total) by mouth 2 (two) times daily. 01/11/21   Richardo Priest, MD  vitamin C (ASCORBIC ACID) 250 MG tablet Take 250 mg by mouth at bedtime.    [provider]  Vitamin D, Ergocalciferol, (DRISDOL) 1.25 MG (50000 UNIT) CAPS capsule Take 1 capsule by mouth once a week. Patient not taking: Reported on 08/29/2021 04/12/21   [provider]    Allergies    Chlorhexidine  gluconate, Cat hair extract, Tramadol, Vancomycin, and Codeine  Review of Systems   Review of Systems  Constitutional:  Positive for fatigue. Negative for chills and fever.  HENT:  Negative for congestion.   Eyes:  Negative for visual disturbance.  Respiratory:  Negative for shortness of breath.   Cardiovascular:  Negative for chest pain.  Gastrointestinal:  Negative for abdominal pain and vomiting.  Genitourinary:  Negative for dysuria and flank pain.  Musculoskeletal:  Negative for back pain, neck pain and neck stiffness.  Skin:  Positive for rash and wound.  Neurological:  Negative for  light-headedness and headaches.   Physical Exam Updated Vital Signs BP 120/67   Pulse 63   Temp 98.2 F (36.8 C) (Oral)   Resp 15   LMP  (LMP Unknown)   SpO2 98%   Physical Exam Vitals and nursing note reviewed.  Constitutional:      General: She is not in acute distress.    Appearance: She is well-developed.  HENT:     Head: Normocephalic and atraumatic.     Mouth/Throat:     Mouth: Mucous membranes are moist.  Eyes:     General:        Right eye: No discharge.        Left eye: No discharge.     Conjunctiva/sclera: Conjunctivae normal.  Neck:     Trachea: No tracheal deviation.  Cardiovascular:     Rate and Rhythm: Normal rate.  Pulmonary:     Effort: Pulmonary effort is normal.  Abdominal:     General: There is no distension.     Palpations: Abdomen is soft.     Tenderness: There is no abdominal tenderness. There is no guarding.  Musculoskeletal:        General: Swelling and tenderness present.     Cervical back: Normal range of motion and neck supple. No rigidity.  Skin:    General: Skin is warm.     Capillary Refill: Capillary refill takes 2 to 3 seconds.     Findings: Rash present.     Comments: Patient has moderate swelling mid and distal dorsal aspect of left foot with erythema, ecchymosis.  Patient has mild fluctuance and black discoloration at surgical site  lateral/distal.  No active drainage.  Mild tender to palpation.  No erythema past the ankle.  Neurological:     General: No focal deficit present.     Mental Status: She is alert.     Cranial Nerves: No cranial nerve deficit.  Psychiatric:        Mood and Affect: Mood normal.    ED Results / Procedures / Treatments   Labs (all labs ordered are listed, but only abnormal results are displayed) Labs Reviewed  COMPREHENSIVE METABOLIC PANEL - Abnormal; Notable for the following components:      Result Value   Sodium 134 (*)    Glucose, Bld 166 (*)    Albumin 2.3 (*)    All other components within normal limits  CBC WITH DIFFERENTIAL/PLATELET - Abnormal; Notable for the following components:   WBC 14.7 (*)    RBC 3.75 (*)    Hemoglobin 10.2 (*)    HCT 33.1 (*)    Platelets 522 (*)    Neutro Abs 11.9 (*)    nRBC 1 (*)    Abs Immature Granulocytes 0.30 (*)    All other components within normal limits  CULTURE, BLOOD (ROUTINE X 2)  CULTURE, BLOOD (ROUTINE X 2)  RESP PANEL BY RT-PCR (FLU A&B, COVID) ARPGX2  LACTIC ACID, PLASMA  LACTIC ACID, PLASMA  URINALYSIS, ROUTINE W REFLEX MICROSCOPIC  CK    EKG None  Radiology DG Chest Portable 1 View  Result Date: 10/09/2021 CLINICAL DATA:  Postoperative shortness of breath. EXAM: PORTABLE CHEST 1 VIEW COMPARISON:  Chest x-ray 10/03/2021. FINDINGS: Lung volumes are low. There is some bibasilar atelectasis. The lungs are otherwise clear. There is no pleural effusion or pneumothorax. There is a small calcified nodule in the right upper lobe, unchanged. The heart is mildly enlarged, unchanged. Left-sided pacemaker present. There are surgical clips  overlying the left upper chest. No acute fractures are seen. IMPRESSION: No active disease. Electronically Signed   By: Ronney Asters M.D.   On: 10/09/2021 20:27   DG Foot Complete Left  Result Date: 10/09/2021 CLINICAL DATA:  Left foot pain, status post great toe amputation EXAM: LEFT FOOT -  COMPLETE 3+ VIEW COMPARISON:  10/04/2021 FINDINGS: Status post transmetatarsal amputation of the left great toe with overlying skin staples. No fracture or dislocation of the left foot. Diffuse soft tissue edema. IMPRESSION: 1. Status post transmetatarsal amputation of the left great toe with overlying skin staples. 2.  No fracture or dislocation of the left foot. 3.  Diffuse soft tissue edema. Electronically Signed   By: Delanna Ahmadi M.D.   On: 10/09/2021 13:53    Procedures .Critical Care Performed by: Elnora Morrison, MD Authorized by: Elnora Morrison, MD   Critical care provider statement:    Critical care time (minutes):  35   Critical care start time:  10/09/2021 8:25 PM   Critical care end time:  10/09/2021 9:00 PM   Critical care time was exclusive of:  Separately billable procedures and treating other patients and teaching time   Critical care was time spent personally by me on the following activities:  Ordering and review of radiographic studies, pulse oximetry, re-evaluation of patient's condition, examination of patient and evaluation of patient's response to treatment   Medications Ordered in ED Medications  piperacillin-tazobactam (ZOSYN) IVPB 3.375 g (0 g Intravenous Stopped 10/09/21 2100)    Followed by  piperacillin-tazobactam (ZOSYN) IVPB 3.375 g (has no administration in time range)  DAPTOmycin (CUBICIN) 350 mg in sodium chloride 0.9 % IVPB (has no administration in time range)    ED Course  I have reviewed the triage vital signs and the nursing notes.  Pertinent labs & imaging results that were available during my care of the patient were reviewed by me and considered in my medical decision making (see chart for details).    MDM Rules/Calculators/A&P                           Patient presents with complex medical history and clinical concern for postop wound infection.  With significant swelling, erythema and darkened areas of the skin IV antibiotics discussed with  pharmacy immediately after evaluation.  Blood cultures ordered.  Patient has no other systemic symptoms, no fever however uncontrolled diabetes concerning for significant infection.  Difficult IV, nursing assisted.  Blood work pending.  X-ray showed edema/swelling reviewed.  Paged podiatry on-call.  Discussed with Dr. Cannon Kettle who will see the patient consult agrees with IV antibiotics and she has seen an image of infection the patient sent her.  Discussed with unassigned hospitalist return call for admission and further treatment.  Blood work reviewed showing leukocytosis with shift 14.7, sodium 134, glucose 166, mild anemia 10.   Final Clinical Impression(s) / ED Diagnoses Final diagnoses:  Postoperative infection, unspecified type, initial encounter  Cellulitis of left foot    Rx / DC Orders ED Discharge Orders     None        Elnora Morrison, MD 10/09/21 2112

## 2021-10-09 NOTE — ED Triage Notes (Signed)
Pt had L great toe amputated yesterday at [facility] and woke up this morning with redness and swelling. Advised by surgeon to come to ED.

## 2021-10-09 NOTE — Progress Notes (Signed)
Pharmacy Antibiotic Note  Jasmine Richardson is a 60 y.o. female admitted on 10/09/2021 with  foot infection .  Pharmacy has been consulted for daptomycin and zosyn dosing.  Patient with a history of breast wound, demand ischemia, ICD, Vtach, unstable angina, NSTEMI, diabetic foot ulcer, emphysema, HF, CABG, GERD, CAD, HTN, and HLD. Patient presenting with post-op problem following left great toe amputation at [facility] on 10/23. Patient advised to be seen in ED for concerning discoloration indicating possible continued infection and vascular compromise.  Labs are pending.  Plan: Patient states that surgeon informed her not to take vancomycin. Reaction to vancomycin is hard to characterize per history but concerning for red mans vs anaphylaxis. Given unable to characterize at this time, will provide one-time dose of daptomycin pending ID consultation for continued dosing. ED provider made aware that continued daptomycin dosing will require approval from ID.  Daptomycin 4mg /kg (350 mg - based on adjusted body weight (82kg) given BMI ~38) once - subsequent dosing pending ID approval Zosyn 3.375g IV q8h (4 hour infusion) Trend WBC, Fever, Renal function, & Clinical course F/u cultures Levels at steady state De-escalate when able     Temp (24hrs), Avg:98.2 F (36.8 C), Min:98.2 F (36.8 C), Max:98.2 F (36.8 C)  No results for input(s): WBC, CREATININE, LATICACIDVEN, VANCOTROUGH, VANCOPEAK, VANCORANDOM, GENTTROUGH, GENTPEAK, GENTRANDOM, TOBRATROUGH, TOBRAPEAK, TOBRARND, AMIKACINPEAK, AMIKACINTROU, AMIKACIN in the last 168 hours.  CrCl cannot be calculated (Patient's most recent lab result is older than the maximum 21 days allowed.).    Allergies  Allergen Reactions   Chlorhexidine Gluconate Itching    Received CHG bath, began itching, required benadryl   Cat Hair Extract Other (See Comments)    Sneezing, watery eyes.   Tramadol Other (See Comments)    Hallucinations   Vancomycin      Other reaction(s): Anaphylaxis*   Codeine Rash   Antimicrobials this admission: zosyn 10/24 >>  daptomycin 10/24 >>   Microbiology results: Pending  Thank you for allowing pharmacy to be a part of this patient's care.  Lorelei Pont, PharmD, BCPS 10/09/2021 7:12 PM ED Clinical Pharmacist -  [phone]

## 2021-10-09 NOTE — Telephone Encounter (Signed)
Telephone note: Patient called and reports that she is still waiting in the ER. States that they have taken an xray and gotten her blood work and she is wondering if she still needs to stay. I advised patient that she should continue to stay because of the concern of continued infection in the foot and the purple discoloration to her amputation stump site of the left foot which is concerning for vascular compromise. I advised patient that she needed to stay at the hospital for antibiotics, further imaging/repeat CT, Vascular consult, and for close follow up by me (podiatry). Patient expressed understanding and reports that she will stay at the hospital and will tell her sister to go home who is waiting with her. I advised patient that once she gets admitted or a room assigned in the ER, I will be by to see her. Patient expressed understanding and reports that she will do what I say because I am the doctor.   -Dr. Cannon Kettle    Picture of patient foot that was sent to me today at 9:47am when I informed her to go to the hospital.

## 2021-10-09 NOTE — H&P (Signed)
History and Physical    Jasmine Richardson HAL:[id number] DOB: Jul 21, 1961 DOA: 10/09/2021  PCP: Jasmine Johns, MD  Patient coming from: Home  I have personally briefly reviewed patient's old medical records in [place]  Chief Complaint: worsen left great toe amputation post-op wound  HPI: Jasmine Richardson is a 60 y.o. female with medical history significant for recent left great toe amputation, chronic diastolic heart failure, history of VT s/p ICD, CAD S/P CABG, hypertension, emphysema, and uncontrolled type 2 diabetes who presents with concerns of worsening postoperative wound.  Pt initially presented outpatient to podiatrist Dr. Cannon Kettle on 10/03/2021 with left great toe planar ulceration and found to have left great toe cellulitis. She was directly admitted to [facility] with left great toe amputation on 10/04/2021.   Had right and left ABI of 0.82 thought to be within normal limits with at least tibial arterial occlusive disease on the left on 10/06/2021.  She was discharged with Bactrim and today had dressing change at home after noticing more increase drainage. Noticed necrotic tissue and streaking redness to surgical site and called Dr Cannon Kettle with podiatry who recommended that she present to the ED for further evaluation. She denies any worsening pain due to peripheral neuropathy.  Denies any fever, nausea or vomiting.  ED Course: She was afebrile, normotensive on room air.  WBC of 14.7, hemoglobin 10.2 which is her baseline, platelet of 522, sodium of 134, potassium of 4.2, BG of 166.  X-ray of the foot shows diffuse soft tissue edema.  Review of Systems: Constitutional: No Weight Change, No Fever ENT/Mouth: No sore throat, No Rhinorrhea Eyes: No Eye Pain, No Vision Changes Cardiovascular: No Chest Pain, no SOB Respiratory: No Cough, No Sputum  Gastrointestinal: No Nausea, No Vomiting Genitourinary: no Urinary Incontinence Musculoskeletal: No Arthralgias, No  Myalgias Skin: No Skin Lesions, No Pruritus, Neuro: no Weakness, No Numbness Psych: No Anxiety/Panic, No Depression, no decrease appetite Heme/Lymph: No Bruising, No Bleeding  Past Medical History:  Diagnosis Date   Acute chest pain 05/08/2016   Angina pectoris ([place]) 09/11/2017   Burping 05/08/2016   Chronic diastolic heart failure ([place]) 11/06/2016   Coronary artery disease involving native coronary artery of native heart with angina pectoris ([place]) 09/12/2015   Overview:  PCI and stent of RCA 2009, last cath 2012 with medical therapy  CABG May 2017   Emphysema lung ([place]) 09/11/2017   Essential hypertension 09/12/2015   GERD (gastroesophageal reflux disease) 05/08/2016   Hyperlipidemia 09/12/2015   Mediastinitis 06/28/2016   Morbid obesity ([place]) 05/08/2016   S/P CABG (coronary artery bypass graft) 06/03/2016   Overview:  The patient underwent sternal reconstruction on 06/21/16 with pec flaps for mediastinitis from a prior CABG in May 2017. On admission, she was critically ill from sepsis and had altered mental status. She was last seen in clinic on 08/09/16 at which time she was doing well.   Severe sepsis ([place]) 06/03/2016   Sinus tachycardia 05/08/2016   Tobacco use disorder 04/20/2016   Overview:  Quit in May 2017.   Uncontrolled type 2 diabetes mellitus 05/08/2016   Wound, surgical, infected 06/06/2016   Overview:  sternal    Past Surgical History:  Procedure Laterality Date   AMPUTATION TOE Right 02/20/2020   Procedure: AMPUTATION RIGHT GREAT  TOE;  Surgeon: Felipa Furnace, DPM;  Location: [place];  Service: Podiatry;  Laterality: Right;   BLADDER SURGERY     CARDIAC CATHETERIZATION     CORONARY ARTERY BYPASS GRAFT  CORONARY STENT INTERVENTION N/A 05/25/2020   Procedure: CORONARY STENT INTERVENTION;  Surgeon: Wellington Hampshire, MD;  Location: [place] [facility];  Service: Cardiovascular;  Laterality: N/A;   CORONARY STENT INTERVENTION N/A 06/22/2020   Procedure: CORONARY STENT INTERVENTION;   Surgeon: Leonie Man, MD;  Location: [place] [facility];  Service: Cardiovascular;  Laterality: N/A;   ICD IMPLANT N/A 12/19/2020   Procedure: ICD IMPLANT;  Surgeon: Evans Lance, MD;  Location: [place] [facility];  Service: Cardiovascular;  Laterality: N/A;   INCISION AND DRAINAGE Right 02/19/2020   Procedure: INCISION AND DRAINAGE;  Surgeon: Trula Slade, DPM;  Location: [place];  Service: Podiatry;  Laterality: Right;  Block done by surgeon   INTRAVASCULAR ULTRASOUND/IVUS N/A 06/22/2020   Procedure: Intravascular Ultrasound/IVUS;  Surgeon: Leonie Man, MD;  Location: [place] [facility];  Service: Cardiovascular;  Laterality: N/A;   LEFT HEART CATH AND CORS/GRAFTS ANGIOGRAPHY N/A 05/23/2020   Procedure: LEFT HEART CATH AND CORS/GRAFTS ANGIOGRAPHY;  Surgeon: Nelva Bush, MD;  Location: [place] [facility];  Service: Cardiovascular;  Laterality: N/A;   LEFT HEART CATH AND CORS/GRAFTS ANGIOGRAPHY N/A 12/19/2020   Procedure: LEFT HEART CATH AND CORS/GRAFTS ANGIOGRAPHY;  Surgeon: Burnell Blanks, MD;  Location: [place] [facility];  Service: Cardiovascular;  Laterality: N/A;   LEFT HEART CATH AND CORS/GRAFTS ANGIOGRAPHY N/A 07/21/2021   Procedure: LEFT HEART CATH AND CORS/GRAFTS ANGIOGRAPHY;  Surgeon: Nelva Bush, MD;  Location: [place] [facility];  Service: Cardiovascular;  Laterality: N/A;   METATARSAL HEAD EXCISION Right 05/02/2020   Procedure: FIRST METATARSAL HEAD RESECTION; RIGHT FOOT WOUND CLOSURE;  Surgeon: Landis Martins, DPM;  Location: [place];  Service: Podiatry;  Laterality: Right;  MAC W/LOCAL   TUBAL LIGATION       reports that she quit smoking about 5 years ago. Her smoking use included cigarettes. She has never used smokeless tobacco. She reports that she does not drink alcohol and does not use drugs. Social History  Allergies  Allergen Reactions   Chlorhexidine Gluconate Itching    Received CHG bath, began itching, required benadryl   Cat  Hair Extract Other (See Comments)    Sneezing, watery eyes.   Tramadol Other (See Comments)    Hallucinations   Vancomycin     Other reaction(s): Anaphylaxis*   Codeine Rash    Family History  Problem Relation Age of Onset   Hypertension Mother    Hyperlipidemia Mother    Heart attack Father    Heart disease Father    Hypertension Father    Alzheimer's disease Father    Hypertension Brother    Hyperlipidemia Brother      Prior to Admission medications   Medication Sig Start Date End Date Taking? Authorizing Provider  amiodarone (PACERONE) 200 MG tablet Take 1 tablet (200 mg total) by mouth daily. 07/22/21 10/20/21  Timothy Lasso, MD  amiodarone (PACERONE) 200 MG tablet Take by mouth. 07/22/21 10/20/21  [provider]  amoxicillin-clavulanate (AUGMENTIN) 875-125 MG tablet Take 1 tablet by mouth 2 (two) times daily. 08/15/21   [provider]  aspirin 81 MG chewable tablet Chew by mouth.    [provider]  aspirin EC 81 MG tablet Take 1 tablet (81 mg total) by mouth daily. 07/22/21 10/20/21  Timothy Lasso, MD  clindamycin (CLEOCIN) 300 MG capsule Take 300 mg by mouth 3 (three) times daily. 08/16/21   [provider]  clopidogrel (PLAVIX) 75 MG tablet Take 1 tablet (  75 mg total) by mouth daily. 07/22/21 10/20/21  Timothy Lasso, MD  cyanocobalamin 1000 MCG tablet Take by mouth.    [provider]  dapagliflozin propanediol (FARXIGA) 10 MG TABS tablet Take 1 tablet (10 mg total) by mouth daily before breakfast. 07/22/21 10/20/21  Timothy Lasso, MD  Evolocumab (REPATHA SURECLICK) 403 MG/ML SOAJ Inject 140 mg into the skin every 14 (fourteen) days. 08/23/21   Richardo Priest, MD  fenofibrate (TRICOR) 145 MG tablet Take 1 tablet (145 mg total) by mouth daily. 07/22/21 10/20/21  Timothy Lasso, MD  fluconazole (DIFLUCAN) 150 MG tablet Take 150 mg by mouth every 3 (three) days. 08/15/21   [provider]  furosemide (LASIX) 40 MG tablet Take 1  tablet (40 mg total) by mouth daily. 07/22/21 10/20/21  Timothy Lasso, MD  glipiZIDE (GLUCOTROL) 10 MG tablet Take by mouth.    [provider]  HUMALOG KWIKPEN 100 UNIT/ML KwikPen INJECT SUBCUTANEOUSLY BEFORE BREAKFAST, LUNCH, AND SUPPER PER SLIDING SCALE AS FOLLOWS: 3 UNITS IF SUGARS 120-150, 5 UNITS IF 151-180, 7 UNITS IF 181-240, 9 UNITS IF 241-300, 12 UNITS IF 301-350, 15 UNITS BEYOND 351, CALL MD IF OVER 400. MAX DAILY DOSE 50 09/09/21   [provider]  HYDROcodone-acetaminophen (NORCO/VICODIN) 5-325 MG tablet Take by mouth.    [provider]  isosorbide mononitrate (IMDUR) 30 MG 24 hr tablet Take 1 tablet (30 mg total) by mouth daily. 07/22/21 08/29/21  Timothy Lasso, MD  LORazepam (ATIVAN) 1 MG tablet Take by mouth.    [provider]  metFORMIN (GLUCOPHAGE) 500 MG tablet Take 1 tablet (500 mg total) by mouth 2 (two) times daily. 07/22/21 10/20/21  Timothy Lasso, MD  metFORMIN (GLUCOPHAGE) 500 MG tablet Take by mouth. 07/22/21 10/20/21  [provider]  metoprolol succinate (TOPROL XL) 25 MG 24 hr tablet Take 1 tablet (25 mg total) by mouth 3 (three) times daily. 07/22/21 08/29/21  Sanjuan Dame, MD  nitroGLYCERIN (NITROSTAT) 0.4 MG SL tablet Place 1 tablet (0.4 mg total) under the tongue every 5 (five) minutes as needed for chest pain. 07/22/21 10/20/21  Timothy Lasso, MD  nystatin (MYCOSTATIN/NYSTOP) powder Apply 1 application topically daily.    [provider]  omeprazole (PRILOSEC) 20 MG capsule Take 1 capsule (20 mg total) by mouth daily. 07/22/21 10/20/21  Timothy Lasso, MD  rosuvastatin (CRESTOR) 20 MG tablet Take 1 tablet (20 mg total) by mouth at bedtime. 07/22/21 10/20/21  Timothy Lasso, MD  TRESIBA FLEXTOUCH 100 UNIT/ML FlexTouch Pen Inject 45 Units into the skin daily. 07/22/21 10/20/21  Timothy Lasso, MD  valsartan (DIOVAN) 80 MG tablet Take 1 tablet (80 mg total) by mouth 2 (two) times daily. 01/11/21   Richardo Priest, MD   vitamin C (ASCORBIC ACID) 250 MG tablet Take 250 mg by mouth at bedtime.    [provider]  Vitamin D, Ergocalciferol, (DRISDOL) 1.25 MG (50000 UNIT) CAPS capsule Take 1 capsule by mouth once a week. Patient not taking: Reported on 08/29/2021 04/12/21   [provider]    Physical Exam: Vitals:   10/09/21 1646 10/09/21 1915 10/09/21 2000 10/09/21 2030  BP: (!) 156/88 (!) 156/78 135/67 120/67  Pulse: 70 79 61 63  Resp:  18 (!) 27 15  Temp:      TempSrc:      SpO2: 100% 100% 95% 98%    Constitutional: NAD, calm, comfortable, nontoxic appearing obese female lying flat in bed Vitals:   10/09/21 1646 10/09/21 1915 10/09/21 2000 10/09/21  2030  BP: (!) 156/88 (!) 156/78 135/67 120/67  Pulse: 70 79 61 63  Resp:  18 (!) 27 15  Temp:      TempSrc:      SpO2: 100% 100% 95% 98%   Eyes: PERRL, lids and conjunctivae normal ENMT: Mucous membranes are moist.  Neck: normal, supple Respiratory: clear to auscultation bilaterally, no wheezing, no crackles. Normal respiratory effort.  Cardiovascular: Regular rate and rhythm, no murmurs / rubs / gallops.  +3 pitting edema of the left dorsal foot.  Weak left pedal pulses are difficult to assess given pitting edema. Abdomen: no tenderness, no masses palpated.  Bowel sounds positive.  Musculoskeletal: no clubbing / cyanosis. S/p right and left great toe transmetatarsal amputation Skin: Necrotic tissue around left great toe stump with spreading erythema up dorsal foot and also focal erythema around medial left malleolus with increased warmth to touch.  No drainage or malodor. See photo in podiatry telephone encounter note Neurologic: CN 2-12 grossly intact. Decrease sensation of bilateral LE. Strength 5/5 in all 4.  Psychiatric: Normal judgment and insight. Alert and oriented x 3. Normal mood.     Labs on Admission: I have personally reviewed following labs and imaging studies  CBC: Recent Labs  Lab 10/09/21 1950  WBC 14.7*   NEUTROABS 11.9*  HGB 10.2*  HCT 33.1*  MCV 88.3  PLT 737*   Basic Metabolic Panel: Recent Labs  Lab 10/09/21 1950  NA 134*  K 4.2  CL 101  CO2 22  GLUCOSE 166*  BUN 11  CREATININE 0.90  CALCIUM 9.1   GFR: CrCl cannot be calculated (Unknown ideal weight.). Liver Function Tests: Recent Labs  Lab 10/09/21 1950  AST 24  ALT 16  ALKPHOS 53  BILITOT 0.5  PROT 7.4  ALBUMIN 2.3*   No results for input(s): LIPASE, AMYLASE in the last 168 hours. No results for input(s): AMMONIA in the last 168 hours. Coagulation Profile: No results for input(s): INR, PROTIME in the last 168 hours. Cardiac Enzymes: No results for input(s): CKTOTAL, CKMB, CKMBINDEX, TROPONINI in the last 168 hours. BNP (last 3 results) Recent Labs    01/12/21 1013  PROBNP 246   HbA1C: No results for input(s): HGBA1C in the last 72 hours. CBG: No results for input(s): GLUCAP in the last 168 hours. Lipid Profile: No results for input(s): CHOL, HDL, LDLCALC, TRIG, CHOLHDL, LDLDIRECT in the last 72 hours. Thyroid Function Tests: No results for input(s): TSH, T4TOTAL, FREET4, T3FREE, THYROIDAB in the last 72 hours. Anemia Panel: No results for input(s): VITAMINB12, FOLATE, FERRITIN, TIBC, IRON, RETICCTPCT in the last 72 hours. Urine analysis:    Component Value Date/Time   COLORURINE STRAW (A) 07/20/2021 0958   APPEARANCEUR CLEAR 07/20/2021 0958   LABSPEC 1.014 07/20/2021 0958   PHURINE 6.0 07/20/2021 0958   GLUCOSEU >=500 (A) 07/20/2021 0958   HGBUR NEGATIVE 07/20/2021 0958   BILIRUBINUR NEGATIVE 07/20/2021 0958   KETONESUR NEGATIVE 07/20/2021 0958   PROTEINUR 30 (A) 07/20/2021 0958   NITRITE NEGATIVE 07/20/2021 0958   LEUKOCYTESUR TRACE (A) 07/20/2021 0958    Radiological Exams on Admission: DG Chest Portable 1 View  Result Date: 10/09/2021 CLINICAL DATA:  Postoperative shortness of breath. EXAM: PORTABLE CHEST 1 VIEW COMPARISON:  Chest x-ray 10/03/2021. FINDINGS: Lung volumes are low.  There is some bibasilar atelectasis. The lungs are otherwise clear. There is no pleural effusion or pneumothorax. There is a small calcified nodule in the right upper lobe, unchanged. The heart is mildly enlarged, unchanged.  Left-sided pacemaker present. There are surgical clips overlying the left upper chest. No acute fractures are seen. IMPRESSION: No active disease. Electronically Signed   By: Ronney Asters M.D.   On: 10/09/2021 20:27   DG Foot Complete Left  Result Date: 10/09/2021 CLINICAL DATA:  Left foot pain, status post great toe amputation EXAM: LEFT FOOT - COMPLETE 3+ VIEW COMPARISON:  10/04/2021 FINDINGS: Status post transmetatarsal amputation of the left great toe with overlying skin staples. No fracture or dislocation of the left foot. Diffuse soft tissue edema. IMPRESSION: 1. Status post transmetatarsal amputation of the left great toe with overlying skin staples. 2.  No fracture or dislocation of the left foot. 3.  Diffuse soft tissue edema. Electronically Signed   By: Delanna Ahmadi M.D.   On: 10/09/2021 13:53      Assessment/Plan  Celluitis of left great toe wound s/p transmetatarsal amputation of the left great toe  -amputation on 10/04/2021 with Dr. Cannon Kettle at [facility] in ED. Continue Zosyn but will need ID consult in the morning to continue Daptomycin -obtain CT of the left foot to evalute for osteomyelitis  -Dr. Cannon Kettle with podiatry will consult tomorrow. She has requested vascular consult.  Had ABI of right and left on 10/06/2021. Both side at 0.82 thought to be within normal limits with at least tibial arterial occlusive disease on the left. Will obtain arterial duplex ultrasound of the left LE  CAD s/p CABG Hx of VT s/p ICD  continue amiodarone, metoprolol , Imdur  Continue aspirin and plavix   Chronic diastolic heart -Compensated -Continue Lasix  HTN  -continue Imdur, ARB, Beta blocker  uncontrolled Type 2 diabetes   Documented HbA1C of 12 but unable to locate actual lab result in -Home regimen of Tresiba 50 units in the morning, Humalog 20 units reportedly just at night -start on resistant SSI q4hr. Will be kept NPO past midnight in anticipation that she will need operative management tomorrow  HLD Continue statin and fenofibrate  Morbid obesity Complicates clinical course    DVT prophylaxis:.SCD Code Status: Full Family Communication: Plan discussed with patient and sister at bedside  disposition Plan: Home with at least 2 midnight stays  Consults called: Podiatry Admission status: inpatient  Level of care: Med-Surg  Status is: Inpatient  Remains inpatient appropriate because: Postoperative complications and infection requiring IV antibiotics and likely surgical intervention         Shelbia Scinto T Carsynn Bethune DO [facility]   If 7PM-7AM, please contact night-coverage [email or web address]   10/09/2021, 9:18 PM

## 2021-10-10 ENCOUNTER — Encounter (HOSPITAL_COMMUNITY): Payer: Self-pay | Admitting: Family Medicine

## 2021-10-10 ENCOUNTER — Inpatient Hospital Stay (HOSPITAL_COMMUNITY): Payer: Managed Care, Other (non HMO) | Admitting: Registered Nurse

## 2021-10-10 ENCOUNTER — Encounter (HOSPITAL_COMMUNITY)
Admission: EM | Disposition: A | Discharge status: Home / Self Care | Payer: Self-pay | Source: Ambulatory Visit | Attending: Family Medicine

## 2021-10-10 ENCOUNTER — Inpatient Hospital Stay (HOSPITAL_COMMUNITY): Payer: Managed Care, Other (non HMO)

## 2021-10-10 DIAGNOSIS — Z9889 Other specified postprocedural states: Secondary | ICD-10-CM

## 2021-10-10 DIAGNOSIS — L03116 Cellulitis of left lower limb: Secondary | ICD-10-CM

## 2021-10-10 DIAGNOSIS — M79605 Pain in left leg: Secondary | ICD-10-CM

## 2021-10-10 HISTORY — PX: IRRIGATION AND DEBRIDEMENT FOOT: SHX6602

## 2021-10-10 LAB — URINALYSIS, ROUTINE W REFLEX MICROSCOPIC
Bacteria, UA: NONE SEEN
Bilirubin Urine: NEGATIVE
Glucose, UA: >=500 mg/dL — AB
Hgb urine dipstick: NEGATIVE
Ketones, ur: NEGATIVE mg/dL
Leukocytes,Ua: NEGATIVE
Nitrite: NEGATIVE
Protein, ur: 100 mg/dL — AB
Specific Gravity, Urine: 1.033 — ABNORMAL HIGH (ref 1.005–1.030)
pH: 7 (ref 5.0–8.0)

## 2021-10-10 LAB — CBC
HCT: 31 % — ABNORMAL LOW (ref 36.0–46.0)
Hemoglobin: 9.5 g/dL — ABNORMAL LOW (ref 12.0–15.0)
MCH: 26.6 pg (ref 26.0–34.0)
MCHC: 30.6 g/dL (ref 30.0–36.0)
MCV: 86.8 fL (ref 80.0–100.0)
Platelets: 493 10*3/uL — ABNORMAL HIGH (ref 150–400)
RBC: 3.57 MIL/uL — ABNORMAL LOW (ref 3.87–5.11)
RDW: 13.3 % (ref 11.5–15.5)
WBC: 13.4 10*3/uL — ABNORMAL HIGH (ref 4.0–10.5)
nRBC: 0 % (ref 0.0–0.2)

## 2021-10-10 LAB — BASIC METABOLIC PANEL
Anion gap: 10 (ref 5–15)
BUN: 11 mg/dL (ref 8–23)
CO2: 23 mmol/L (ref 22–32)
Calcium: 9 mg/dL (ref 8.9–10.3)
Chloride: 103 mmol/L (ref 98–111)
Creatinine, Ser: 0.93 mg/dL (ref 0.44–1.00)
GFR, Estimated: >60 mL/min (ref >60)
Glucose, Bld: 143 mg/dL — ABNORMAL HIGH (ref 70–99)
Potassium: 3.5 mmol/L (ref 3.5–5.1)
Sodium: 136 mmol/L (ref 135–145)

## 2021-10-10 LAB — HEMOGLOBIN A1C
Hgb A1c MFr Bld: 12.8 % — ABNORMAL HIGH (ref 4.8–5.6)
Mean Plasma Glucose: 320.66 mg/dL

## 2021-10-10 LAB — SURGICAL PCR SCREEN
MRSA, PCR: NEGATIVE
Staphylococcus aureus: NEGATIVE

## 2021-10-10 LAB — GLUCOSE, CAPILLARY
Glucose-Capillary: 155 mg/dL — ABNORMAL HIGH (ref 70–99)
Glucose-Capillary: 106 mg/dL — ABNORMAL HIGH (ref 70–99)
Glucose-Capillary: 99 mg/dL (ref 70–99)
Glucose-Capillary: 109 mg/dL — ABNORMAL HIGH (ref 70–99)

## 2021-10-10 LAB — CBG MONITORING, ED
Glucose-Capillary: 117 mg/dL — ABNORMAL HIGH (ref 70–99)
Glucose-Capillary: 152 mg/dL — ABNORMAL HIGH (ref 70–99)
Glucose-Capillary: 186 mg/dL — ABNORMAL HIGH (ref 70–99)

## 2021-10-10 SURGERY — IRRIGATION AND DEBRIDEMENT FOOT
Anesthesia: Monitor Anesthesia Care | Site: Foot | Laterality: Left

## 2021-10-10 MED ORDER — PROPOFOL 10 MG/ML IV BOLUS
INTRAVENOUS | Status: DC | PRN
  Administered 2021-10-10: 25 mg via INTRAVENOUS

## 2021-10-10 MED ORDER — AMIODARONE HCL 200 MG PO TABS
200.0000 mg | ORAL_TABLET | Freq: Every day | ORAL | Status: DC
  Administered 2021-10-11 – 2021-10-14 (×4): 200 mg via ORAL
  Filled 2021-10-10 (×4): qty 1

## 2021-10-10 MED ORDER — LIDOCAINE HCL (PF) 1 % IJ SOLN
INTRAMUSCULAR | Status: AC
  Filled 2021-10-10: qty 30

## 2021-10-10 MED ORDER — INFLUENZA VAC SPLIT QUAD 0.5 ML IM SUSY
0.5000 mL | PREFILLED_SYRINGE | INTRAMUSCULAR | Status: AC
  Administered 2021-10-14: 0.5 mL via INTRAMUSCULAR
  Filled 2021-10-10: qty 0.5

## 2021-10-10 MED ORDER — METOPROLOL SUCCINATE ER 25 MG PO TB24
25.0000 mg | ORAL_TABLET | Freq: Three times a day (TID) | ORAL | Status: DC
  Administered 2021-10-10 – 2021-10-14 (×11): 25 mg via ORAL
  Filled 2021-10-10 (×11): qty 1

## 2021-10-10 MED ORDER — FENTANYL CITRATE (PF) 100 MCG/2ML IJ SOLN: 25.0000 ug | INTRAMUSCULAR | Status: DC | PRN

## 2021-10-10 MED ORDER — ISOSORBIDE MONONITRATE ER 30 MG PO TB24
30.0000 mg | ORAL_TABLET | Freq: Every day | ORAL | Status: DC
  Administered 2021-10-11 – 2021-10-14 (×4): 30 mg via ORAL
  Filled 2021-10-10 (×4): qty 1

## 2021-10-10 MED ORDER — ASPIRIN EC 81 MG PO TBEC
81.0000 mg | DELAYED_RELEASE_TABLET | Freq: Every day | ORAL | Status: DC
  Administered 2021-10-11 – 2021-10-14 (×4): 81 mg via ORAL
  Filled 2021-10-10 (×4): qty 1

## 2021-10-10 MED ORDER — MUPIROCIN 2 % EX OINT
1.0000 "application " | TOPICAL_OINTMENT | Freq: Two times a day (BID) | CUTANEOUS | Status: DC
  Administered 2021-10-10 – 2021-10-14 (×7): 1 via NASAL
  Filled 2021-10-10 (×2): qty 22

## 2021-10-10 MED ORDER — MIDAZOLAM HCL 2 MG/2ML IJ SOLN
INTRAMUSCULAR | Status: AC
  Filled 2021-10-10: qty 2

## 2021-10-10 MED ORDER — LACTATED RINGERS IV SOLN: INTRAVENOUS | Status: DC | PRN

## 2021-10-10 MED ORDER — BUPIVACAINE HCL (PF) 0.25 % IJ SOLN
INTRAMUSCULAR | Status: AC
  Filled 2021-10-10: qty 30

## 2021-10-10 MED ORDER — ONDANSETRON HCL 4 MG/2ML IJ SOLN
INTRAMUSCULAR | Status: DC | PRN
  Administered 2021-10-10: 4 mg via INTRAVENOUS

## 2021-10-10 MED ORDER — BUPIVACAINE HCL 0.25 % IJ SOLN
INTRAMUSCULAR | Status: DC | PRN
  Administered 2021-10-10: 20 mL

## 2021-10-10 MED ORDER — PANTOPRAZOLE SODIUM 40 MG PO TBEC
40.0000 mg | DELAYED_RELEASE_TABLET | Freq: Every day | ORAL | Status: DC
  Administered 2021-10-11 – 2021-10-14 (×4): 40 mg via ORAL
  Filled 2021-10-10 (×4): qty 1

## 2021-10-10 MED ORDER — MUPIROCIN 2 % EX OINT
TOPICAL_OINTMENT | Freq: Every day | CUTANEOUS | Status: DC
  Filled 2021-10-10: qty 22

## 2021-10-10 MED ORDER — FENTANYL CITRATE (PF) 250 MCG/5ML IJ SOLN
INTRAMUSCULAR | Status: DC | PRN
  Administered 2021-10-10: 25 ug via INTRAVENOUS

## 2021-10-10 MED ORDER — PROPOFOL 10 MG/ML IV BOLUS
INTRAVENOUS | Status: AC
  Filled 2021-10-10: qty 20

## 2021-10-10 MED ORDER — EPHEDRINE 5 MG/ML INJ
INTRAVENOUS | Status: AC
  Filled 2021-10-10: qty 5

## 2021-10-10 MED ORDER — PROPOFOL 500 MG/50ML IV EMUL
INTRAVENOUS | Status: DC | PRN
  Administered 2021-10-10: 75 ug/kg/min via INTRAVENOUS

## 2021-10-10 MED ORDER — FENTANYL CITRATE (PF) 250 MCG/5ML IJ SOLN
INTRAMUSCULAR | Status: AC
  Filled 2021-10-10: qty 5

## 2021-10-10 MED ORDER — OXYCODONE HCL 5 MG PO TABS: 5.0000 mg | ORAL_TABLET | Freq: Once | ORAL | Status: DC | PRN

## 2021-10-10 MED ORDER — LIDOCAINE HCL 1 % IJ SOLN
INTRAMUSCULAR | Status: DC | PRN
  Administered 2021-10-10: 20 mL

## 2021-10-10 MED ORDER — CLOPIDOGREL BISULFATE 75 MG PO TABS
75.0000 mg | ORAL_TABLET | Freq: Every day | ORAL | Status: DC
  Administered 2021-10-11 – 2021-10-14 (×4): 75 mg via ORAL
  Filled 2021-10-10 (×4): qty 1

## 2021-10-10 MED ORDER — OXYCODONE HCL 5 MG/5ML PO SOLN: 5.0000 mg | Freq: Once | ORAL | Status: DC | PRN

## 2021-10-10 MED ORDER — IRBESARTAN 150 MG PO TABS
150.0000 mg | ORAL_TABLET | Freq: Every day | ORAL | Status: DC
  Administered 2021-10-11 – 2021-10-14 (×4): 150 mg via ORAL
  Filled 2021-10-10 (×5): qty 1

## 2021-10-10 MED ORDER — 0.9 % SODIUM CHLORIDE (POUR BTL) OPTIME
TOPICAL | Status: DC | PRN
  Administered 2021-10-10: 1000 mL

## 2021-10-10 MED ORDER — ROSUVASTATIN CALCIUM 20 MG PO TABS
20.0000 mg | ORAL_TABLET | Freq: Every day | ORAL | Status: DC
  Administered 2021-10-10 – 2021-10-13 (×5): 20 mg via ORAL
  Filled 2021-10-10 (×5): qty 1

## 2021-10-10 MED ORDER — ONDANSETRON HCL 4 MG/2ML IJ SOLN: 4.0000 mg | Freq: Once | INTRAMUSCULAR | Status: DC | PRN

## 2021-10-10 MED ORDER — FENOFIBRATE 160 MG PO TABS
160.0000 mg | ORAL_TABLET | Freq: Every day | ORAL | Status: DC
  Administered 2021-10-11 – 2021-10-14 (×4): 160 mg via ORAL
  Filled 2021-10-10 (×5): qty 1

## 2021-10-10 MED ORDER — MIDAZOLAM HCL 5 MG/5ML IJ SOLN
INTRAMUSCULAR | Status: DC | PRN
  Administered 2021-10-10: 2 mg via INTRAVENOUS

## 2021-10-10 MED ORDER — PHENYLEPHRINE HCL-NACL 20-0.9 MG/250ML-% IV SOLN
INTRAVENOUS | Status: DC | PRN
  Administered 2021-10-10: 15 ug/min via INTRAVENOUS

## 2021-10-10 MED ORDER — SODIUM CHLORIDE 0.9 % IR SOLN
Status: DC | PRN
  Administered 2021-10-10: 3000 mL

## 2021-10-10 MED ORDER — LACTATED RINGERS IV SOLN: INTRAVENOUS | Status: DC

## 2021-10-10 MED ORDER — MUPIROCIN 2 % EX OINT: 1.0000 "application " | TOPICAL_OINTMENT | Freq: Two times a day (BID) | CUTANEOUS | Status: DC

## 2021-10-10 MED ORDER — FUROSEMIDE 20 MG PO TABS
20.0000 mg | ORAL_TABLET | Freq: Every day | ORAL | Status: DC
  Administered 2021-10-11 – 2021-10-14 (×4): 20 mg via ORAL
  Filled 2021-10-10 (×4): qty 1

## 2021-10-10 MED ORDER — AMISULPRIDE (ANTIEMETIC) 5 MG/2ML IV SOLN: 10.0000 mg | Freq: Once | INTRAVENOUS | Status: DC | PRN

## 2021-10-10 MED ORDER — EPHEDRINE SULFATE 50 MG/ML IJ SOLN
INTRAMUSCULAR | Status: DC | PRN
  Administered 2021-10-10: 5 mg via INTRAVENOUS

## 2021-10-10 SURGICAL SUPPLY — 49 items
APL PRP STRL LF DISP 70% ISPRP (MISCELLANEOUS) ×1
APL SKNCLS STERI-STRIP NONHPOA (GAUZE/BANDAGES/DRESSINGS) ×1
BAG COUNTER SPONGE SURGICOUNT (BAG) ×2 IMPLANT
BAG SPNG CNTER NS LX DISP (BAG) ×1
BENZOIN TINCTURE PRP APPL 2/3 (GAUZE/BANDAGES/DRESSINGS) ×2 IMPLANT
BLADE SURG 15 STRL LF DISP TIS (BLADE) ×2 IMPLANT
BLADE SURG 15 STRL SS (BLADE) ×4
BNDG CMPR 9X4 STRL LF SNTH (GAUZE/BANDAGES/DRESSINGS) ×1
BNDG COHESIVE 1X5 TAN STRL LF (GAUZE/BANDAGES/DRESSINGS) ×1 IMPLANT
BNDG ELASTIC 4X5.8 VLCR STR LF (GAUZE/BANDAGES/DRESSINGS) ×2 IMPLANT
BNDG ESMARK 4X9 LF (GAUZE/BANDAGES/DRESSINGS) ×2 IMPLANT
BNDG GAUZE ELAST 4 BULKY (GAUZE/BANDAGES/DRESSINGS) ×3 IMPLANT
CANISTER SUCT 3000ML PPV (MISCELLANEOUS) IMPLANT
CHLORAPREP W/TINT 26 (MISCELLANEOUS) ×2 IMPLANT
CNTNR URN SCR LID CUP LEK RST (MISCELLANEOUS) ×1 IMPLANT
CONT SPEC 4OZ STRL OR WHT (MISCELLANEOUS) ×2
COVER SURGICAL LIGHT HANDLE (MISCELLANEOUS) ×4 IMPLANT
CUFF TOURN SGL QUICK 18X4 (TOURNIQUET CUFF) ×2 IMPLANT
DRAPE SURG 17X23 STRL (DRAPES) ×2 IMPLANT
DRSG PAD ABDOMINAL 8X10 ST (GAUZE/BANDAGES/DRESSINGS) ×2 IMPLANT
ELECT NDL TIP 2.8 STRL (NEEDLE) IMPLANT
ELECT NEEDLE TIP 2.8 STRL (NEEDLE) IMPLANT
ELECT REM PT RETURN 9FT ADLT (ELECTROSURGICAL)
ELECTRODE REM PT RTRN 9FT ADLT (ELECTROSURGICAL) IMPLANT
GAUZE PACKING IODOFORM 1/4X15 (PACKING) ×1 IMPLANT
GAUZE SPONGE 4X4 12PLY STRL (GAUZE/BANDAGES/DRESSINGS) ×2 IMPLANT
GAUZE SPONGE 4X4 12PLY STRL LF (GAUZE/BANDAGES/DRESSINGS) ×1 IMPLANT
GLOVE SURG ENC MOIS LTX SZ6.5 (GLOVE) ×2 IMPLANT
GLOVE SURG LTX SZ7 (GLOVE) ×1 IMPLANT
GLOVE SURG UNDER POLY LF SZ6.5 (GLOVE) ×2 IMPLANT
GOWN STRL REUS W/ TWL LRG LVL3 (GOWN DISPOSABLE) ×2 IMPLANT
GOWN STRL REUS W/TWL LRG LVL3 (GOWN DISPOSABLE) ×4
HANDPIECE INTERPULSE COAX TIP (DISPOSABLE) ×4
KIT BASIN OR (CUSTOM PROCEDURE TRAY) ×2 IMPLANT
KIT TURNOVER KIT B (KITS) ×2 IMPLANT
NDL HYPO 25GX1X1/2 BEV (NEEDLE) ×1 IMPLANT
NEEDLE HYPO 25GX1X1/2 BEV (NEEDLE) ×2 IMPLANT
NS IRRIG 1000ML POUR BTL (IV SOLUTION) ×2 IMPLANT
PACK ORTHO EXTREMITY (CUSTOM PROCEDURE TRAY) ×2 IMPLANT
PAD ABD 8X10 STRL (GAUZE/BANDAGES/DRESSINGS) ×1 IMPLANT
PAD ARMBOARD 7.5X6 YLW CONV (MISCELLANEOUS) ×4 IMPLANT
SET HNDPC FAN SPRY TIP SCT (DISPOSABLE) ×2 IMPLANT
SPONGE T-LAP 18X18 ~~LOC~~+RFID (SPONGE) ×1 IMPLANT
STAPLER VISISTAT 35W (STAPLE) ×2 IMPLANT
STRIP CLOSURE SKIN 1/2X4 (GAUZE/BANDAGES/DRESSINGS) ×2 IMPLANT
SYR CONTROL 10ML LL (SYRINGE) ×2 IMPLANT
TOWEL GREEN STERILE (TOWEL DISPOSABLE) ×2 IMPLANT
TOWEL GREEN STERILE FF (TOWEL DISPOSABLE) ×2 IMPLANT
YANKAUER SUCT BULB TIP NO VENT (SUCTIONS) IMPLANT

## 2021-10-10 NOTE — ED Notes (Signed)
Provider at bedside at this time

## 2021-10-10 NOTE — Anesthesia Postprocedure Evaluation (Signed)
Anesthesia Post Note  Patient: Jasmine Richardson  Procedure(s) Performed: IRRIGATION AND DEBRIDEMENT FOOT (Left: Foot)     Patient location during evaluation: PACU Anesthesia Type: MAC Level of consciousness: awake and alert Pain management: pain level controlled Vital Signs Assessment: post-procedure vital signs reviewed and stable Respiratory status: spontaneous breathing, nonlabored ventilation and respiratory function stable Cardiovascular status: blood pressure returned to baseline and stable Postop Assessment: no apparent nausea or vomiting Anesthetic complications: no   No notable events documented.  Last Vitals:  Vitals:   10/10/21 1950 10/10/21 2005  BP: 130/63 125/63  Pulse: 64 63  Resp: 19 (!) 21  Temp: 37 C   SpO2: 97% 95%    Last Pain:  Vitals:   10/10/21 1950  TempSrc:   PainSc: 0-No pain   Pain Goal:    LLE Motor Response: Purposeful movement (10/10/21 2017) LLE Sensation: Full sensation (10/10/21 2017)            Pervis Hocking

## 2021-10-10 NOTE — Anesthesia Preprocedure Evaluation (Addendum)
Anesthesia Evaluation  Patient identified by MRN, date of birth, ID band Patient awake    Reviewed: Allergy & Precautions, NPO status , Patient's Chart, lab work & pertinent test results, reviewed documented beta blocker date and time   Airway Mallampati: III  TM Distance: >3 FB Neck ROM: Full    Dental  (+) Edentulous Upper, Edentulous Lower   Pulmonary COPD, former smoker,    Pulmonary exam normal breath sounds clear to auscultation       Cardiovascular hypertension, Pt. on medications and Pt. on home beta blockers + CAD, + Past MI, + CABG (2017), + Peripheral Vascular Disease (plavix- LD yesterday) and +CHF (LVEF 40-45%)  Normal cardiovascular exam+ Cardiac Defibrillator (has never gone off per pt)  Rhythm:Regular Rate:Normal  Echo 12/2020: 1. Left ventricular ejection fraction, by estimation, is 45 to 50%. The  left ventricle has mildly decreased function. The left ventricle  demonstrates regional wall motion abnormalities (see scoring  diagram/findings for description). Left ventricular  diastolic parameters are indeterminate.  2. Right ventricular systolic function is normal. The right ventricular  size is normal. Tricuspid regurgitation signal is inadequate for assessing  PA pressure.  3. The mitral valve is grossly normal. Trivial mitral valve  regurgitation.  4. The aortic valve is tricuspid. There is moderate calcification of the  aortic valve. Aortic valve regurgitation is not visualized.  5. The inferior vena cava is normal in size with greater than 50%  respiratory variability, suggesting right atrial pressure of 3 mmHg.   Cath 07/2021: Conclusions: 1. Severe native coronary artery disease, as detailed below, not significantly changed from prior catheterization in 12/2020. 2. Widely patent LIMA-LAD graft and LMCA stent. 3. Patent mid RCA stents with moderate restenosis (~40%). 4. Moderately reduced left  ventricular systolic function (LVEF 02-77%). 5. Upper normal left ventricular filling pressure (LVEDP 15 mmHg).  Recommendations: 1. Escalate antianginal therapy; no focal targets again noted on today's catheterization. 2. Aggressive secondary prevention.    Neuro/Psych negative neurological ROS  negative psych ROS   GI/Hepatic Neg liver ROS, GERD  Medicated and Controlled,  Endo/Other  diabetes, Poorly Controlled, Type 2, Oral Hypoglycemic Agents, Insulin DependentObesity BMI 39 a1c 12.8, B/L LE peripheral neuropathy   Renal/GU negative Renal ROS  negative genitourinary   Musculoskeletal Cellulitis, necrosis of amputation stump L foot   Abdominal (+) + obese,   Peds negative pediatric ROS (+)  Hematology  (+) Blood dyscrasia, anemia , 9.5/31   Anesthesia Other Findings   Reproductive/Obstetrics negative OB ROS                            Anesthesia Physical Anesthesia Plan  ASA: 3  Anesthesia Plan: MAC   Post-op Pain Management:    Induction:   PONV Risk Score and Plan: 2 and Propofol infusion and TIVA  Airway Management Planned: Natural Airway and Simple Face Mask  Additional Equipment: None  Intra-op Plan:   Post-operative Plan:   Informed Consent: I have reviewed the patients History and Physical, chart, labs and discussed the procedure including the risks, benefits and alternatives for the proposed anesthesia with the patient or authorized representative who has indicated his/her understanding and acceptance.       Plan Discussed with: CRNA  Anesthesia Plan Comments:         Anesthesia Quick Evaluation

## 2021-10-10 NOTE — Anesthesia Procedure Notes (Signed)
Procedure Name: MAC Date/Time: 10/10/2021 6:51 PM Performed by: Jearld Pies, CRNA Pre-anesthesia Checklist: Patient identified, Emergency Drugs available, Suction available, Patient being monitored and Timeout performed Patient Re-evaluated:Patient Re-evaluated prior to induction Oxygen Delivery Method: Non-rebreather mask Preoxygenation: Pre-oxygenation with 100% oxygen

## 2021-10-10 NOTE — Progress Notes (Signed)
PROGRESS NOTE    Jasmine Richardson  VQQ:[id number] DOB: 26-Dec-1960 DOA: 10/09/2021 PCP: Nicholos Johns, MD   Chief Complaint  Patient presents with   Post-op Problem   Brief Narrative/Hospital Course:  Jasmine Richardson, 60 y.o. female with PMH of chronic diastolic CHF, history of V. tach status post ICD, CAD status post CABG, HTN, HLD, emphysema, well-controlled type 2 diabetes who had left hallux amputation on 10/04/21 at [facility] with + cultures of group b strept seen by podiatry for admission due to worsening postoperative wound. From a recent hospitalization with discharge on Bactrim and during dressing changes at home noticed more increasing drainage and necrotic tissue called Dr. Cannon Kettle of podiatry who recommended that she presents to the ED. In the ED was afebrile, lab with leukocytosis anemia thrombocytosis mild hyponatremia. X-ray of the foot showed diffuse soft tissue edema. CT of the left foot : Findings suggestive of osteomyelitis with subcutaneous soft tissue edema, emphysema along the recent first digit transmetatarsal amputation, findings may be postsurgical with superimposed infection not excluded.  Necrotizing fasciitis is a clinical diagnosis and cannot be excluded. Podiatry has seen the patient patient has been admitted on Zosyn  Subjective: Seen examined  No pain, some itch on left foot. Overnight blood pressure 140s to 150s, saturating on room air, afebrile Leukocytosis slightly downtrending.  Assessment & Plan:  Cellulitis with necrosis and amputation stump site, wound, post-operative-with recent OR cultures with group B streptococcus: Concern for necrotizing fasciitis although group B is a typical organism to cause this.  Podiatry Dr. Cannon Kettle on board planning for OR this evening for I&D and giving amputations stump site open.  Elevate the leg, continue pain management, continue broad-spectrum antibiotics-on Zosyn.  Essential hypertension CAD S/P CABG HLD: BP is well  controlled.  Patient is on aspirin, Plavix, Imdur, metoprolol, Crestor fibroids and irbesartan.  Monitor blood pressure.  History of V. tach ICD in place 2 yrs ago: On amiodarone  Chronic diastolic EPP:[id number] oral Lasix , watch for fluid overload Net IO Since Admission: No IO data has been entered for this period [10/10/21 0916]  Filed Weights   10/10/21 0026  Weight: 113.4 kg    Type 2 diabetes mellitus with foot ulcer with poorly controlled HbA1c at 12.8.  Blood sugar at target here. She says she is in 2o at home form 400 recently. Recent Labs  Lab 10/09/21 2236 10/09/21 2352 10/10/21 0416 10/10/21 0806  GLUCAP 152* 186* 152* 117*    Anemia likely from chronic disease hemoglobin 9.5 g.Monitor Recent Labs  Lab 10/09/21 1950 10/10/21 0400  HGB 10.2* 9.5*  HCT 33.1* 31.0*    Thrombocytosis likely reactive.  Monitor  Class II Obesity:Patient's Body mass index is 39.16 kg/m. : Will benefit with PCP follow-up, weight loss  healthy lifestyle and outpatient sleep evaluation- she has not had osa eval done.  DVT prophylaxis: SCDs Start: 10/09/21 2143 Code Status:   Code Status: Full Code Family Communication: plan of care discussed with patient at bedside. Status is: Inpatient  Remains inpatient appropriate because: for management of post op wound and need for OR  Lives with son, independent Anticipated d/c- 3 days  Objective: Vitals last 24 hrs: Vitals:   10/10/21 0430 10/10/21 0600 10/10/21 0800 10/10/21 0900  BP: (!) 152/66 (!) 144/79 (!) 122/103 124/73  Pulse: 65 66 (!) 58 65  Resp: 15 16 20 17   Temp:      TempSrc:      SpO2: 97% 97% 97% 98%  Weight:  Height:       Weight change:  No intake or output data in the 24 hours ending 10/10/21 0916 Net IO Since Admission: No IO data has been entered for this period [10/10/21 0916]   Physical Examination: General exam: AA0x3, weak,older than stated age. HEENT:Oral mucosa moist, Ear/Nose WNL  grossly,dentition normal. Respiratory system: B/l clear BS, no use of accessory muscle, non tender. Cardiovascular system: S1 & S2 +,No JVD. Gastrointestinal system: Abdomen soft, NT,ND, BS+. Nervous System:Alert, awake, moving extremities. Extremities: edema none. Left foot with blackish discolored wound see pic Skin: No rashes, no icterus. MSK: Normal muscle bulk, tone, power.   Medications reviewed:  Scheduled Meds:  amiodarone  200 mg Oral Daily   aspirin EC  81 mg Oral Daily   clopidogrel  75 mg Oral Daily   fenofibrate  160 mg Oral Daily   furosemide  20 mg Oral Daily   insulin aspart  0-20 Units Subcutaneous Q4H   irbesartan  150 mg Oral Daily   isosorbide mononitrate  30 mg Oral Daily   metoprolol succinate  25 mg Oral TID   pantoprazole  40 mg Oral Daily   rosuvastatin  20 mg Oral QHS   Continuous Infusions:  piperacillin-tazobactam (ZOSYN)  IV Stopped (10/10/21 0826)    Diet Order             Diet NPO time specified Except for: Sips with Meds  Diet effective ____                   Weight change:   Wt Readings from Last 3 Encounters:  10/10/21 113.4 kg  08/29/21 112.1 kg  07/22/21 113.9 kg     Consultants:see note  Procedures:see note Antimicrobials: Anti-infectives (From admission, onward)    Start     Dose/Rate Route Frequency Ordered Stop   10/10/21 0400  piperacillin-tazobactam (ZOSYN) IVPB 3.375 g       See Hyperspace for full Linked Orders Report.   3.375 g 12.5 mL/hr over 240 Minutes Intravenous Every 8 hours 10/09/21 1903     10/09/21 1945  DAPTOmycin (CUBICIN) 350 mg in sodium chloride 0.9 % IVPB        4 mg/kg  82 kg (Order-Specific) 114 mL/hr over 30 Minutes Intravenous  Once 10/09/21 1930 10/09/21 2224   10/09/21 1915  piperacillin-tazobactam (ZOSYN) IVPB 3.375 g       See Hyperspace for full Linked Orders Report.   3.375 g 100 mL/hr over 30 Minutes Intravenous  Once 10/09/21 1903 10/09/21 2100      Culture/Microbiology     Component Value Date/Time   SDES BLOOD LEFT HAND 10/09/2021 1931   SPECREQUEST  10/09/2021 1931    BOTTLES DRAWN AEROBIC AND ANAEROBIC Blood Culture results may not be optimal due to an inadequate volume of blood received in culture bottles   CULT  10/09/2021 1931    NO GROWTH < 12 HOURS Performed at [facility], [place] [address] [place], [place] [address]    REPTSTATUS PENDING 10/09/2021 1931    Other culture-see note  Unresulted Labs (From admission, onward)     Start     Ordered   10/09/21 1851  Blood culture (routine x 2)  BLOOD CULTURE X 2,   STAT      10/09/21 1850   10/09/21 1311  Lactic acid, plasma  Now then every 2 hours,   STAT      10/09/21 1310  Data Reviewed: I have personally reviewed following labs and imaging studies CBC: Recent Labs  Lab 10/09/21 1950 10/10/21 0400  WBC 14.7* 13.4*  NEUTROABS 11.9*  --   HGB 10.2* 9.5*  HCT 33.1* 31.0*  MCV 88.3 86.8  PLT 522* 409*   Basic Metabolic Panel: Recent Labs  Lab 10/09/21 1950 10/10/21 0400  NA 134* 136  K 4.2 3.5  CL 101 103  CO2 22 23  GLUCOSE 166* 143*  BUN 11 11  CREATININE 0.90 0.93  CALCIUM 9.1 9.0   GFR: Estimated Creatinine Clearance: 82.5 mL/min (by C-G formula based on SCr of 0.93 mg/dL). Liver Function Tests: Recent Labs  Lab 10/09/21 1950  AST 24  ALT 16  ALKPHOS 53  BILITOT 0.5  PROT 7.4  ALBUMIN 2.3*   No results for input(s): LIPASE, AMYLASE in the last 168 hours. No results for input(s): AMMONIA in the last 168 hours. Coagulation Profile: No results for input(s): INR, PROTIME in the last 168 hours. Cardiac Enzymes: Recent Labs  Lab 10/09/21 1931  CKTOTAL 29*   BNP (last 3 results) Recent Labs    01/12/21 1013  PROBNP 246   HbA1C: Recent Labs    10/10/21 0400  HGBA1C 12.8*   CBG: Recent Labs  Lab 10/09/21 2236 10/09/21 2352 10/10/21 0416 10/10/21 0806  GLUCAP 152* 186* 152* 117*   Lipid Profile: No results for input(s): CHOL,  HDL, LDLCALC, TRIG, CHOLHDL, LDLDIRECT in the last 72 hours. Thyroid Function Tests: No results for input(s): TSH, T4TOTAL, FREET4, T3FREE, THYROIDAB in the last 72 hours. Anemia Panel: No results for input(s): VITAMINB12, FOLATE, FERRITIN, TIBC, IRON, RETICCTPCT in the last 72 hours. Sepsis Labs: Recent Labs  Lab 10/09/21 1511  LATICACIDVEN 1.3    Recent Results (from the past 240 hour(s))  Resp Panel by RT-PCR (Flu A&B, Covid) Nasopharyngeal Swab     Status: None   Collection Time: 10/09/21  6:51 PM   Specimen: Nasopharyngeal Swab; Nasopharyngeal(NP) swabs in vial transport medium  Result Value Ref Range Status   SARS Coronavirus 2 by RT PCR NEGATIVE NEGATIVE Final    Comment: (NOTE) SARS-CoV-2 target nucleic acids are NOT DETECTED.  The SARS-CoV-2 RNA is generally detectable in upper respiratory specimens during the acute phase of infection. The lowest concentration of SARS-CoV-2 viral copies this assay can detect is 138 copies/mL. A negative result does not preclude SARS-Cov-2 infection and should not be used as the sole basis for treatment or other patient management decisions. A negative result may occur with  improper specimen collection/handling, submission of specimen other than nasopharyngeal swab, presence of viral mutation(s) within the areas targeted by this assay, and inadequate number of viral copies(<138 copies/mL). A negative result must be combined with clinical observations, patient history, and epidemiological information. The expected result is Negative.  Fact Sheet for Patients:  EntrepreneurPulse.com.au  Fact Sheet for Healthcare Providers:  IncredibleEmployment.be  This test is no t yet approved or cleared by the [place] FDA and  has been authorized for detection and/or diagnosis of SARS-CoV-2 by FDA under an Emergency Use Authorization (EUA). This EUA will remain  in effect (meaning this test can be used) for  the duration of the COVID-19 declaration under Section 564(b)(1) of the Act, 21 U.S.C.section 360bbb-3(b)(1), unless the authorization is terminated  or revoked sooner.       Influenza A by PCR NEGATIVE NEGATIVE Final   Influenza B by PCR NEGATIVE NEGATIVE Final    Comment: (NOTE) The Xpert Xpress SARS-CoV-2/FLU/RSV plus  assay is intended as an aid in the diagnosis of influenza from Nasopharyngeal swab specimens and should not be used as a sole basis for treatment. Nasal washings and aspirates are unacceptable for Xpert Xpress SARS-CoV-2/FLU/RSV testing.  Fact Sheet for Patients: EntrepreneurPulse.com.au  Fact Sheet for Healthcare Providers: IncredibleEmployment.be  This test is not yet approved or cleared by the [place] FDA and has been authorized for detection and/or diagnosis of SARS-CoV-2 by FDA under an Emergency Use Authorization (EUA). This EUA will remain in effect (meaning this test can be used) for the duration of the COVID-19 declaration under Section 564(b)(1) of the Act, 21 U.S.C. section 360bbb-3(b)(1), unless the authorization is terminated or revoked.  Performed at [facility], [place] [address], [place], [place] [address]   Blood culture (routine x 2)     Status: None (Preliminary result)   Collection Time: 10/09/21  7:31 PM   Specimen: BLOOD LEFT HAND  Result Value Ref Range Status   Specimen Description BLOOD LEFT HAND  Final   Special Requests   Final    BOTTLES DRAWN AEROBIC AND ANAEROBIC Blood Culture results may not be optimal due to an inadequate volume of blood received in culture bottles   Culture   Final    NO GROWTH < 12 HOURS Performed at [facility], [place] [address] [place], [place] [address]    Report Status PENDING  Incomplete     Radiology Studies: CT FOOT LEFT W CONTRAST  Result Date: 10/09/2021 CLINICAL DATA:  Concern for osteomyelitis. Redness and swelling of the first digit in a  patient status post recent transmetatarsal amputation of the left first digit EXAM: CT OF THE LOWER LEFT EXTREMITY WITH CONTRAST TECHNIQUE: Multidetector CT imaging of the lower left extremity was performed according to the standard protocol following intravenous contrast administration. CONTRAST:  51mL OMNIPAQUE IOHEXOL 350 MG/ML SOLN COMPARISON:  X-ray left foot 10/03/2021, x-ray left foot 10/09/2021 FINDINGS: Bones/Joint/Cartilage Status post transmetatarsal amputation of the left first digit. No cortical erosion or destruction. No acute displaced fracture. No dislocation. No effusion. Ligaments Suboptimally assessed by CT. Muscles and Tendons Grossly unremarkable. Soft tissues Subcutaneus soft tissue edema and emphysema of the first digit with overlying skin staples. No organized fluid collection. Mild subcutaneus soft tissue edema along the dorsum of the foot. No retained radiopaque foreign body. IMPRESSION: No CT findings to suggest osteomyelitis in a patient with subcutaneus soft tissue edema and emphysema along a recent first digit transmetatarsal amputation. Findings may be postsurgical with superimposed infection not excluded. Please note necrotizing fasciitis is a clinical diagnosis and cannot be excluded. No organized fluid collection. No retained radiopaque foreign body. Skin staples are noted overlying the wound. Electronically Signed   By: Iven Finn M.D.   On: 10/09/2021 23:16   DG Chest Portable 1 View  Result Date: 10/09/2021 CLINICAL DATA:  Postoperative shortness of breath. EXAM: PORTABLE CHEST 1 VIEW COMPARISON:  Chest x-ray 10/03/2021. FINDINGS: Lung volumes are low. There is some bibasilar atelectasis. The lungs are otherwise clear. There is no pleural effusion or pneumothorax. There is a small calcified nodule in the right upper lobe, unchanged. The heart is mildly enlarged, unchanged. Left-sided pacemaker present. There are surgical clips overlying the left upper chest. No acute  fractures are seen. IMPRESSION: No active disease. Electronically Signed   By: Ronney Asters M.D.   On: 10/09/2021 20:27   DG Foot Complete Left  Result Date: 10/09/2021 CLINICAL DATA:  Left foot pain, status post  great toe amputation EXAM: LEFT FOOT - COMPLETE 3+ VIEW COMPARISON:  10/04/2021 FINDINGS: Status post transmetatarsal amputation of the left great toe with overlying skin staples. No fracture or dislocation of the left foot. Diffuse soft tissue edema. IMPRESSION: 1. Status post transmetatarsal amputation of the left great toe with overlying skin staples. 2.  No fracture or dislocation of the left foot. 3.  Diffuse soft tissue edema. Electronically Signed   By: Delanna Ahmadi M.D.   On: 10/09/2021 13:53     LOS: 1 day   Antonieta Pert, MD [facility]  10/10/2021, 9:16 AM

## 2021-10-10 NOTE — Consult Note (Signed)
Podiatry Consult Note  To: Dr. Ileene Musa Reason for consult: Left foot infection  From: Dr. Cannon Kettle  HPI: Jasmine Richardson is a 60 y.o. female patient who seen at bedside for evaluation of left foot infection and pain. Patient is s/p left hallux amputation on 10/04/21 at [facility] with + cultures of group b strept. Patient was sent here on yesterday by myself her podiatrist for worsening infection. Patient denies nausea, vomiting, fever or chills at this time.   Patient Active Problem List   Diagnosis Date Noted   Cellulitis, wound, post-operative 10/09/2021   Abnormal mammogram of left breast 09/04/2021   Breast wound, right, subsequent encounter 09/04/2021   Demand ischemia [facility])    ICD (implantable cardioverter-defibrillator) in place 05/16/2021   Ventricular tachycardia 12/17/2020   Unstable angina ([place]) 05/22/2020   NSTEMI (non-ST elevated myocardial infarction) ([place]) 05/22/2020   Diabetic foot ulcer ([place]) 02/16/2020   Angina pectoris ([place]) 09/11/2017   Emphysema lung ([place]) 09/11/2017   Chronic diastolic heart failure ([place]) 11/06/2016   Mediastinitis 06/28/2016   Wound, surgical, infected 06/06/2016   S/P CABG (coronary artery bypass graft) 06/03/2016   Severe sepsis ([place]) 06/03/2016   GERD (gastroesophageal reflux disease) 05/08/2016   Morbid obesity ([place]) 05/08/2016   Type 2 diabetes mellitus with foot ulcer ([place]) 05/08/2016   Sinus tachycardia 05/08/2016   Acute chest pain 05/08/2016   Burping 05/08/2016   Tobacco use disorder 04/20/2016   Coronary artery disease involving native coronary artery of native heart with angina pectoris ([place]) 09/12/2015   Essential hypertension 09/12/2015   Hyperlipidemia 09/12/2015    No current facility-administered medications on file prior to encounter.   Current Outpatient Medications on File Prior to Encounter  Medication Sig Dispense Refill   amiodarone (PACERONE) 200 MG tablet Take 1 tablet (200 mg total) by mouth daily. 90 tablet 0    amiodarone (PACERONE) 200 MG tablet Take by mouth.     amoxicillin-clavulanate (AUGMENTIN) 875-125 MG tablet Take 1 tablet by mouth 2 (two) times daily.     aspirin 81 MG chewable tablet Chew by mouth.     aspirin EC 81 MG tablet Take 1 tablet (81 mg total) by mouth daily. 90 tablet 0   clindamycin (CLEOCIN) 300 MG capsule Take 300 mg by mouth 3 (three) times daily.     clopidogrel (PLAVIX) 75 MG tablet Take 1 tablet (75 mg total) by mouth daily. 90 tablet 0   cyanocobalamin 1000 MCG tablet Take by mouth.     dapagliflozin propanediol (FARXIGA) 10 MG TABS tablet Take 1 tablet (10 mg total) by mouth daily before breakfast. 90 tablet 0   Evolocumab (REPATHA SURECLICK) 932 MG/ML SOAJ Inject 140 mg into the skin every 14 (fourteen) days. 2 mL 11   fenofibrate (TRICOR) 145 MG tablet Take 1 tablet (145 mg total) by mouth daily. 90 tablet 0   fluconazole (DIFLUCAN) 150 MG tablet Take 150 mg by mouth every 3 (three) days.     furosemide (LASIX) 40 MG tablet Take 1 tablet (40 mg total) by mouth daily. 90 tablet 0   glipiZIDE (GLUCOTROL) 10 MG tablet Take by mouth.     HUMALOG KWIKPEN 100 UNIT/ML KwikPen INJECT SUBCUTANEOUSLY BEFORE BREAKFAST, LUNCH, AND SUPPER PER SLIDING SCALE AS FOLLOWS: 3 UNITS IF SUGARS 120-150, 5 UNITS IF 151-180, 7 UNITS IF 181-240, 9 UNITS IF 241-300, 12 UNITS IF 301-350, 15 UNITS BEYOND 351, CALL MD IF OVER 400. MAX DAILY DOSE 50     HYDROcodone-acetaminophen (NORCO/VICODIN) 5-325 MG tablet  Take by mouth.     isosorbide mononitrate (IMDUR) 30 MG 24 hr tablet Take 1 tablet (30 mg total) by mouth daily. 30 tablet 0   LORazepam (ATIVAN) 1 MG tablet Take by mouth.     metFORMIN (GLUCOPHAGE) 500 MG tablet Take 1 tablet (500 mg total) by mouth 2 (two) times daily. 180 tablet 0   metFORMIN (GLUCOPHAGE) 500 MG tablet Take by mouth.     metoprolol succinate (TOPROL XL) 25 MG 24 hr tablet Take 1 tablet (25 mg total) by mouth 3 (three) times daily. 90 tablet 0   nitroGLYCERIN (NITROSTAT) 0.4  MG SL tablet Place 1 tablet (0.4 mg total) under the tongue every 5 (five) minutes as needed for chest pain. 270 tablet 12   nystatin (MYCOSTATIN/NYSTOP) powder Apply 1 application topically daily.     omeprazole (PRILOSEC) 20 MG capsule Take 1 capsule (20 mg total) by mouth daily. 90 capsule 0   rosuvastatin (CRESTOR) 20 MG tablet Take 1 tablet (20 mg total) by mouth at bedtime. 90 tablet 0   TRESIBA FLEXTOUCH 100 UNIT/ML FlexTouch Pen Inject 45 Units into the skin daily. 3 mL 1   valsartan (DIOVAN) 80 MG tablet Take 1 tablet (80 mg total) by mouth 2 (two) times daily. 180 tablet 3   vitamin C (ASCORBIC ACID) 250 MG tablet Take 250 mg by mouth at bedtime.     Vitamin D, Ergocalciferol, (DRISDOL) 1.25 MG (50000 UNIT) CAPS capsule Take 1 capsule by mouth once a week. (Patient not taking: Reported on 08/29/2021)      Allergies  Allergen Reactions   Chlorhexidine Gluconate Itching    Received CHG bath, began itching, required benadryl   Cat Hair Extract Other (See Comments)    Sneezing, watery eyes.   Tramadol Other (See Comments)    Hallucinations   Vancomycin     Other reaction(s): Anaphylaxis*   Codeine Rash    Past Surgical History:  Procedure Laterality Date   AMPUTATION TOE Right 02/20/2020   Procedure: AMPUTATION RIGHT GREAT  TOE;  Surgeon: Felipa Furnace, DPM;  Location: [facility];  Service: Podiatry;  Laterality: Right;   BLADDER SURGERY     CARDIAC CATHETERIZATION     CORONARY ARTERY BYPASS GRAFT     CORONARY STENT INTERVENTION N/A 05/25/2020   Procedure: CORONARY STENT INTERVENTION;  Surgeon: Wellington Hampshire, MD;  Location: [facility];  Service: Cardiovascular;  Laterality: N/A;   CORONARY STENT INTERVENTION N/A 06/22/2020   Procedure: CORONARY STENT INTERVENTION;  Surgeon: Leonie Man, MD;  Location: [facility];  Service: Cardiovascular;  Laterality: N/A;   ICD IMPLANT N/A 12/19/2020   Procedure: ICD IMPLANT;  Surgeon: Evans Lance, MD;  Location: [facility];  Service: Cardiovascular;  Laterality: N/A;   INCISION AND DRAINAGE Right 02/19/2020   Procedure: INCISION AND DRAINAGE;  Surgeon: Trula Slade, DPM;  Location: [facility];  Service: Podiatry;  Laterality: Right;  Block done by surgeon   INTRAVASCULAR ULTRASOUND/IVUS N/A 06/22/2020   Procedure: Intravascular Ultrasound/IVUS;  Surgeon: Leonie Man, MD;  Location: [facility];  Service: Cardiovascular;  Laterality: N/A;   LEFT HEART CATH AND CORS/GRAFTS ANGIOGRAPHY N/A 05/23/2020   Procedure: LEFT HEART CATH AND CORS/GRAFTS ANGIOGRAPHY;  Surgeon: Nelva Bush, MD;  Location: [facility];  Service: Cardiovascular;  Laterality: N/A;   LEFT HEART CATH AND CORS/GRAFTS ANGIOGRAPHY N/A 12/19/2020   Procedure: LEFT HEART CATH AND CORS/GRAFTS ANGIOGRAPHY;  Surgeon: Lauree Chandler  D, MD;  Location: [facility];  Service: Cardiovascular;  Laterality: N/A;   LEFT HEART CATH AND CORS/GRAFTS ANGIOGRAPHY N/A 07/21/2021   Procedure: LEFT HEART CATH AND CORS/GRAFTS ANGIOGRAPHY;  Surgeon: Nelva Bush, MD;  Location: [facility];  Service: Cardiovascular;  Laterality: N/A;   METATARSAL HEAD EXCISION Right 05/02/2020   Procedure: FIRST METATARSAL HEAD RESECTION; RIGHT FOOT WOUND CLOSURE;  Surgeon: Landis Martins, DPM;  Location: [facility];  Service: Podiatry;  Laterality: Right;  MAC W/LOCAL   TUBAL LIGATION      Family History  Problem Relation Age of Onset   Hypertension Mother    Hyperlipidemia Mother    Heart attack Father    Heart disease Father    Hypertension Father    Alzheimer's disease Father    Hypertension Brother    Hyperlipidemia Brother     Social History   Socioeconomic History   Marital status: Married    Spouse name: Not on file   Number of children: Not on file   Years of education: Not on file   Highest education level: Not on file  Occupational History   Not on file  Tobacco Use   Smoking status: Former    Types:  Cigarettes    Quit date: 05/2016    Years since quitting: 5.4   Smokeless tobacco: Never  Vaping Use   Vaping Use: Never used  Substance and Sexual Activity   Alcohol use: No   Drug use: No   Sexual activity: Yes  Other Topics Concern   Not on file  Social History Narrative   Not on file   Social Determinants of Health   Financial Resource Strain: Not on file  Food Insecurity: Not on file  Transportation Needs: Not on file  Physical Activity: Not on file  Stress: Not on file  Social Connections: Not on file  Intimate Partner Violence: Not on file     Objective:  Today's Vitals   10/10/21 0030 10/10/21 0300 10/10/21 0330 10/10/21 0400  BP: 140/67 (!) 146/68 130/65 (!) 146/66  Pulse: 65 70 69 68  Resp: _0 Temp:      TempSrc:      SpO2: 94% 97% 96% 95%  Weight:      Height:      PainSc:       Body mass index is 39.16 kg/m.   General: Alert and oriented x3 in no acute distress  Dermatology: staples intact and sutures intact to left hallux amputation site, there is significant necrosis of the stump with cellulitis and warmth extending to the level of the ankle and pain along the PT tendon course on the left foot     Vascular: Dorsalis Pedis and Posterior Tibial pedal pulses difficult to palpate on the left due to swelling, + increased temp to left foot at area of infection.  Neurology: Protective sensation diminished bilateral.   Musculoskeletal: Moderate pain to the left foot with pain to left PT tendon course, no pain to calf on left.     Results Xrays IMPRESSION: 1. Status post transmetatarsal amputation of the left great toe with overlying skin staples.   2.  No fracture or dislocation of the left foot.   3.  Diffuse soft tissue edema.  CT IMPRESSION: No CT findings to suggest osteomyelitis in a patient with subcutaneus soft tissue edema and emphysema along a recent first digit transmetatarsal amputation. Findings may be postsurgical  with superimposed infection not  excluded. Please note necrotizing fasciitis is a clinical diagnosis and cannot be excluded. No organized fluid collection. No retained radiopaque foreign body. Skin staples are noted overlying the wound.      Results for orders placed or performed during the hospital encounter of 10/09/21  Resp Panel by RT-PCR (Flu A&B, Covid) Nasopharyngeal Swab     Status: None   Collection Time: 10/09/21  6:51 PM   Specimen: Nasopharyngeal Swab; Nasopharyngeal(NP) swabs in vial transport medium  Result Value Ref Range Status   SARS Coronavirus 2 by RT PCR NEGATIVE NEGATIVE Final    Comment: (NOTE) SARS-CoV-2 target nucleic acids are NOT DETECTED.  The SARS-CoV-2 RNA is generally detectable in upper respiratory specimens during the acute phase of infection. The lowest concentration of SARS-CoV-2 viral copies this assay can detect is 138 copies/mL. A negative result does not preclude SARS-Cov-2 infection and should not be used as the sole basis for treatment or other patient management decisions. A negative result may occur with  improper specimen collection/handling, submission of specimen other than nasopharyngeal swab, presence of viral mutation(s) within the areas targeted by this assay, and inadequate number of viral copies(<138 copies/mL). A negative result must be combined with clinical observations, patient history, and epidemiological information. The expected result is Negative.  Fact Sheet for Patients:  EntrepreneurPulse.com.au  Fact Sheet for Healthcare Providers:  IncredibleEmployment.be  This test is no t yet approved or cleared by the [place] FDA and  has been authorized for detection and/or diagnosis of SARS-CoV-2 by FDA under an Emergency Use Authorization (EUA). This EUA will remain  in effect (meaning this test can be used) for the duration of the COVID-19 declaration under Section 564(b)(1) of the  Act, 21 U.S.C.section 360bbb-3(b)(1), unless the authorization is terminated  or revoked sooner.       Influenza A by PCR NEGATIVE NEGATIVE Final   Influenza B by PCR NEGATIVE NEGATIVE Final    Comment: (NOTE) The Xpert Xpress SARS-CoV-2/FLU/RSV plus assay is intended as an aid in the diagnosis of influenza from Nasopharyngeal swab specimens and should not be used as a sole basis for treatment. Nasal washings and aspirates are unacceptable for Xpert Xpress SARS-CoV-2/FLU/RSV testing.  Fact Sheet for Patients: EntrepreneurPulse.com.au  Fact Sheet for Healthcare Providers: IncredibleEmployment.be  This test is not yet approved or cleared by the [place] FDA and has been authorized for detection and/or diagnosis of SARS-CoV-2 by FDA under an Emergency Use Authorization (EUA). This EUA will remain in effect (meaning this test can be used) for the duration of the COVID-19 declaration under Section 564(b)(1) of the Act, 21 U.S.C. section 360bbb-3(b)(1), unless the authorization is terminated or revoked.  Performed at [facility], [place] [address] [place], [place] [address]      Assessment and Plan: Problem List Items Addressed This Visit   None Visit Diagnoses     Postoperative infection, unspecified type, initial encounter    -  Primary   Relevant Medications   DAPTOmycin (CUBICIN) 350 mg in sodium chloride 0.9 % IVPB (Completed)   Cellulitis of left foot            -Complete examination performed -Xrays and CT reviewed -Discussed treatment options for worsening left foot infection with history of Grp B Strept -Advised patient that Grp B Strept is atypical to cause nec fasciitis however there is concern for it clinically since there is necrosis at the amputation stump site -We will plan for OR this evening for I&D with debridement  and leaving the amputation stump open for secondary healing and repeating deep wound and tissue  cultures; Patient is agreeable and understands the risk of worsening infection and understands that she is at risk of losing her limb or life. Patient is also aware that she may require multiple debridements pending how her tissues are healing from the infection. Patient to be NPO after breakfast. Consent to be obtained -Recommend vascular consult as well to assess for any ischemic component to this patients infection; Awaiting vascular ultrasound  -Recommend rest and elevation for pain and edema control -Recommend continue with medical management and IV antibiotics -Patient to be partial weightbearing for transfers only on left foot  -Consult appreciated -Podiatry to follow   Dr. Landis Martins, [place] and [place] [phone] office [phone] cell Available via secure chat  Time spent with patient for exam and coordination of care:  35  mins

## 2021-10-10 NOTE — Consult Note (Signed)
[facility] Nurse Consult Note: Patient receiving care in [facility] Reason for Consult: Sacral redness and wound on right breast Wound type: MASD/ITD of the intergluteal fold that is pink and blanchable, dry and peeling Full thickness wound on the right breast of unknown origin that measures approx 5 x 4 that is pink and moist with 3/4 of the scab coming off Pressure Injury POA: NA Periwound: Intact Dressing procedure/placement/frequency: Continue sacral foam dressing. Peel down all sites with a foam dressing EACH shift.  Record your observations.  Change foam dressing every 3 days or PRN soiling.  Right breast wound: cleans with NS then apply a coat of Mupirocin (Bactroban) ointment, cover with a non-adherent dressing. Change daily.   Monitor the wound area(s) for worsening of condition such as: Signs/symptoms of infection, increase in size, development of or worsening of odor, development of pain, or increased pain at the affected locations.   Notify the medical team if any of these develop.  Thank you for the consult. [facility] nurse will not follow at this time.   Please re-consult the [facility] team if needed.  Cathlean Marseilles Tamala Julian, MSN, RN, [place], [place] [place], [facility] Wound Treatment Associate Pager [phone]

## 2021-10-10 NOTE — Op Note (Signed)
DATE: 10/10/2021  SURGEON: Landis Martins, DPM  PREOPERATIVE DIAGNOSIS: Left foot cellulitis with necrosis of amputation stump  POSTOPERATIVE DIAGNOSIS: Same  PROCEDURE PERFORMED: Left foot incision and drainage with wound culture   HEMOSTASIS: Left ankle tourniquet x 25 minutes  ESTIMATED BLOOD LOSS:  Minimal  ANESTHESIA:  MAC with local 20cc of 1:1 mixture 1% lidocaine and 0.25% marcaine plain  SPECIMENS: MICRO: Wound cultures  COMPLICATIONS:  None.   INDICATIONS FOR PROCEDURE:  This patient is a pleasant 60 y.o. diabetic female patient seen at bedside after being admitted less than 24 hours for left foot infection, not improving.  Patient had previous amputation of left hallux 10/04/2021 at [facility] and was discharged on 10/06/2021 to home had assistance from son with changing dressing and noticed that the plantar stump was necrotic and was recommended to go to ER for admission.  At this time, all risks, complications, benefits, and alternatives were explained in detail to the patient. Patient opts for left foot incision and drainage with wound culture.  Risks and complications include but are not limited to infection, recurrence of symptoms, pain, numbness, wound dehiscence, delayed healing, as well as need for future surgery/amputation.  No guarantees were given or applied.  All questions were answered to the patient's satisfaction, and the patient has consented to the above procedure.  All preoperative labs and H&P, medical clearances have been obtained and NPO status past 9am has been confirmed.  PREPARATION FOR PROCEDURE:     The patient was brought to the operating room and placed on the operating table in supine position.  A pneumatic ankletourniquet was placed about the patient's left foot but not yet inflated.  After the department of anesthesia had administered MAC anesthesia. A local block was administered and the left foot was then scrubbed, prepped, and draped in the usual  aseptic manner.  After elevating the left leg the pneumatic tourniquet was inflated to 250 mmHg.  PROCEDURE IN DETAIL:  At this time, attention was directed to the patient's left foot where there was of note necrotic amputation stump site at previous hallux.  Staples and sutures were removed then utilizing a 15 blade all necrotic tissue was sharply excised, bloody and purulent drainage was expressed and then using tenotomy scissors all nonviable tissue was excised from the wound bed, the infection was noted to track plantar along the flexor tendons and to the ankle, deep wound cultures were obtained and then pulse irrigation was performed. Following a few staples were applied and then the remaining wound bed was packed open.  The open amputation stump site wound measured approximately 4 x 4 cm to the level of fatty tissue and bone.  The left foot was then dressed with iodoform packing, 4 x 4 gauze, abd, Kerlix, and Coban and ACE.  At this time, the left pneumatic tourniquet was deflated, and a positive hyperemic response was noted to all remaining digits on the left foot.  The patient tolerated the procedure and anesthesia well.  Upon transfer to the recovery room, the patient's vital signs were stable, and neurovascular status was intact.    Patient to be transferred back to medical floor to continue on IV antibiotics to tailor pending intra-op cultures.  Partial weightbearing with surgical shoe for transfers only.  Recommend vascular follow-up.  Abnormal vascular ultrasound.  Podiatry to follow.  Landis Martins, DPM

## 2021-10-10 NOTE — ED Notes (Signed)
Help get patient pulled up in the bed patient is resting with call bell in reach  

## 2021-10-10 NOTE — ED Notes (Signed)
Ordered pt's breakfast tray

## 2021-10-10 NOTE — ED Notes (Signed)
Breakfast tray delivered

## 2021-10-10 NOTE — Brief Op Note (Signed)
10/10/2021  7:51 PM  PATIENT:  Jasmine Richardson  60 y.o. female  PRE-OPERATIVE DIAGNOSIS:  Cellulitis and necrosis of amputation stump left foot  POST-OPERATIVE DIAGNOSIS:  Cellulitis and necrosis of amputation stump left foot  PROCEDURE:  Procedure(s): IRRIGATION AND DEBRIDEMENT FOOT (Left)  SURGEON:  Surgeon(s) and Role:    Landis Martins, DPM - Primary  PHYSICIAN ASSISTANT:   ASSISTANTS: none   ANESTHESIA:   MAC  EBL:  5 mL   BLOOD ADMINISTERED:none  DRAINS: none   LOCAL MEDICATIONS USED:  MARCAINE  AND LIDOCAINE  SPECIMEN:  Source of Specimen:  Deep wound left foot   DISPOSITION OF SPECIMEN:   microbiology   COUNTS:  YES  TOURNIQUET:  * No tourniquets in log *  DICTATION: .Note written in EPIC  PLAN OF CARE: Admit to inpatient   PATIENT DISPOSITION:  PACU - hemodynamically stable.   Delay start of Pharmacological VTE agent (>24hrs) due to surgical blood loss or risk of bleeding: no

## 2021-10-10 NOTE — Plan of Care (Addendum)
@  1130 Patient arrived to the floor, patient left foot black and red with little drainage. Patient has redness above the sacral area with skin pealing, no drainage, patient has moisture damage and redness under breast and skin folds. Patient has small white patches on tongue. Wound on right breast, dressing applying, little drainage.  Consent complete for surgery and blood,  no CHG bath due to allergy, PCR complete, waiting results. Patient stable. Will continue to monitor.  @1250  WOC Marlou Porch at bedside assessing sacral area and wound to chest.  Problem: Education: Goal: Knowledge of General Education information will improve Description: Including pain rating scale, medication(s)/side effects and non-pharmacologic comfort measures Outcome: Progressing   Problem: Activity: Goal: Risk for activity intolerance will decrease Outcome: Progressing   Problem: Pain Managment: Goal: General experience of comfort will improve Outcome: Progressing   Problem: Safety: Goal: Ability to remain free from injury will improve Outcome: Progressing   Problem: Skin Integrity: Goal: Risk for impaired skin integrity will decrease Outcome: Progressing

## 2021-10-10 NOTE — Transfer of Care (Signed)
Immediate Anesthesia Transfer of Care Note  Patient: Jasmine Richardson  Procedure(s) Performed: IRRIGATION AND DEBRIDEMENT FOOT (Left: Foot)  Patient Location: PACU  Anesthesia Type:MAC  Level of Consciousness: awake, alert  and oriented  Airway & Oxygen Therapy: Patient Spontanous Breathing  Post-op Assessment: Report given to RN and Post -op Vital signs reviewed and stable  Post vital signs: Reviewed and stable  Last Vitals:  Vitals Value Taken Time  BP 130/63 10/10/21 1947  Temp    Pulse 65 10/10/21 1948  Resp 19 10/10/21 1948  SpO2 98 % 10/10/21 1948  Vitals shown include unvalidated device data.  Last Pain:  Vitals:   10/10/21 1843  TempSrc: Oral  PainSc: 0-No pain         Complications: No notable events documented.

## 2021-10-10 NOTE — Progress Notes (Signed)
Lower extremity arterial duplex has been completed.   Preliminary results in CV Proc.   Envy Meno Shi Grose 10/10/2021 10:10 AM

## 2021-10-11 ENCOUNTER — Encounter (HOSPITAL_COMMUNITY)
Admission: EM | Disposition: A | Discharge status: Home / Self Care | Payer: Self-pay | Source: Ambulatory Visit | Attending: Family Medicine

## 2021-10-11 ENCOUNTER — Encounter (HOSPITAL_COMMUNITY): Payer: Self-pay | Admitting: Sports Medicine

## 2021-10-11 DIAGNOSIS — I70262 Atherosclerosis of native arteries of extremities with gangrene, left leg: Secondary | ICD-10-CM

## 2021-10-11 DIAGNOSIS — Z79899 Other long term (current) drug therapy: Secondary | ICD-10-CM

## 2021-10-11 DIAGNOSIS — Z7982 Long term (current) use of aspirin: Secondary | ICD-10-CM

## 2021-10-11 DIAGNOSIS — I10 Essential (primary) hypertension: Secondary | ICD-10-CM

## 2021-10-11 DIAGNOSIS — E785 Hyperlipidemia, unspecified: Secondary | ICD-10-CM

## 2021-10-11 DIAGNOSIS — I2510 Atherosclerotic heart disease of native coronary artery without angina pectoris: Secondary | ICD-10-CM

## 2021-10-11 DIAGNOSIS — Z7902 Long term (current) use of antithrombotics/antiplatelets: Secondary | ICD-10-CM

## 2021-10-11 DIAGNOSIS — Z87891 Personal history of nicotine dependence: Secondary | ICD-10-CM

## 2021-10-11 DIAGNOSIS — T8744 Infection of amputation stump, left lower extremity: Secondary | ICD-10-CM

## 2021-10-11 DIAGNOSIS — E119 Type 2 diabetes mellitus without complications: Secondary | ICD-10-CM

## 2021-10-11 DIAGNOSIS — Z794 Long term (current) use of insulin: Secondary | ICD-10-CM

## 2021-10-11 DIAGNOSIS — Z7984 Long term (current) use of oral hypoglycemic drugs: Secondary | ICD-10-CM

## 2021-10-11 HISTORY — PX: PERIPHERAL VASCULAR BALLOON ANGIOPLASTY: CATH118281

## 2021-10-11 HISTORY — PX: ABDOMINAL AORTOGRAM W/LOWER EXTREMITY: CATH118223

## 2021-10-11 LAB — BASIC METABOLIC PANEL
Anion gap: 8 (ref 5–15)
BUN: 9 mg/dL (ref 8–23)
CO2: 24 mmol/L (ref 22–32)
Calcium: 9.1 mg/dL (ref 8.9–10.3)
Chloride: 104 mmol/L (ref 98–111)
Creatinine, Ser: 1.08 mg/dL — ABNORMAL HIGH (ref 0.44–1.00)
GFR, Estimated: 58 mL/min — ABNORMAL LOW (ref >60)
Glucose, Bld: 188 mg/dL — ABNORMAL HIGH (ref 70–99)
Potassium: 4.5 mmol/L (ref 3.5–5.1)
Sodium: 136 mmol/L (ref 135–145)

## 2021-10-11 LAB — GLUCOSE, CAPILLARY
Glucose-Capillary: 159 mg/dL — ABNORMAL HIGH (ref 70–99)
Glucose-Capillary: 132 mg/dL — ABNORMAL HIGH (ref 70–99)
Glucose-Capillary: 113 mg/dL — ABNORMAL HIGH (ref 70–99)

## 2021-10-11 LAB — CBC
HCT: 31.6 % — ABNORMAL LOW (ref 36.0–46.0)
Hemoglobin: 9.6 g/dL — ABNORMAL LOW (ref 12.0–15.0)
MCH: 26.7 pg (ref 26.0–34.0)
MCHC: 30.4 g/dL (ref 30.0–36.0)
MCV: 88 fL (ref 80.0–100.0)
Platelets: 486 10*3/uL — ABNORMAL HIGH (ref 150–400)
RBC: 3.59 MIL/uL — ABNORMAL LOW (ref 3.87–5.11)
RDW: 13.3 % (ref 11.5–15.5)
WBC: 13.2 10*3/uL — ABNORMAL HIGH (ref 4.0–10.5)
nRBC: 0 % (ref 0.0–0.2)

## 2021-10-11 LAB — POCT ACTIVATED CLOTTING TIME
Activated Clotting Time: 237 seconds
Activated Clotting Time: 190 seconds
Activated Clotting Time: 173 seconds

## 2021-10-11 SURGERY — ABDOMINAL AORTOGRAM W/LOWER EXTREMITY
Anesthesia: LOCAL

## 2021-10-11 MED ORDER — LIDOCAINE HCL (PF) 1 % IJ SOLN
INTRAMUSCULAR | Status: DC | PRN
  Administered 2021-10-11: 15 mL via INTRADERMAL

## 2021-10-11 MED ORDER — HEPARIN (PORCINE) IN NACL 1000-0.9 UT/500ML-% IV SOLN
INTRAVENOUS | Status: AC
  Filled 2021-10-11: qty 500

## 2021-10-11 MED ORDER — METFORMIN HCL 500 MG PO TABS
500.0000 mg | ORAL_TABLET | Freq: Two times a day (BID) | ORAL | Status: DC
  Administered 2021-10-12 – 2021-10-14 (×5): 500 mg via ORAL
  Filled 2021-10-11 (×5): qty 1

## 2021-10-11 MED ORDER — HYDRALAZINE HCL 20 MG/ML IJ SOLN: 5.0000 mg | INTRAMUSCULAR | Status: DC | PRN

## 2021-10-11 MED ORDER — SODIUM CHLORIDE 0.9 % IV SOLN: INTRAVENOUS | Status: DC

## 2021-10-11 MED ORDER — FENTANYL CITRATE (PF) 100 MCG/2ML IJ SOLN
INTRAMUSCULAR | Status: AC
  Filled 2021-10-11: qty 2

## 2021-10-11 MED ORDER — HEPARIN SODIUM (PORCINE) 1000 UNIT/ML IJ SOLN
INTRAMUSCULAR | Status: AC
  Filled 2021-10-11: qty 1

## 2021-10-11 MED ORDER — SODIUM CHLORIDE 0.9 % IV SOLN: 250.0000 mL | INTRAVENOUS | Status: DC | PRN

## 2021-10-11 MED ORDER — SODIUM CHLORIDE 0.9 % IV SOLN: INTRAVENOUS | Status: AC

## 2021-10-11 MED ORDER — SODIUM CHLORIDE 0.9% FLUSH
3.0000 mL | Freq: Two times a day (BID) | INTRAVENOUS | Status: DC
  Administered 2021-10-11 – 2021-10-13 (×3): 3 mL via INTRAVENOUS

## 2021-10-11 MED ORDER — IODIXANOL 320 MG/ML IV SOLN
INTRAVENOUS | Status: DC | PRN
  Administered 2021-10-11: 130 mL

## 2021-10-11 MED ORDER — SODIUM CHLORIDE 0.9% FLUSH: 3.0000 mL | INTRAVENOUS | Status: DC | PRN

## 2021-10-11 MED ORDER — LIDOCAINE HCL (PF) 1 % IJ SOLN
INTRAMUSCULAR | Status: AC
  Filled 2021-10-11: qty 30

## 2021-10-11 MED ORDER — FENTANYL CITRATE (PF) 100 MCG/2ML IJ SOLN
INTRAMUSCULAR | Status: DC | PRN
  Administered 2021-10-11: 25 ug via INTRAVENOUS

## 2021-10-11 MED ORDER — ACETAMINOPHEN 325 MG PO TABS
650.0000 mg | ORAL_TABLET | Freq: Four times a day (QID) | ORAL | Status: AC | PRN
  Administered 2021-10-11 – 2021-10-13 (×2): 650 mg via ORAL
  Filled 2021-10-11: qty 2

## 2021-10-11 MED ORDER — INSULIN GLARGINE-YFGN 100 UNIT/ML ~~LOC~~ SOLN
15.0000 [IU] | Freq: Every day | SUBCUTANEOUS | Status: DC
  Administered 2021-10-11 – 2021-10-13 (×3): 15 [IU] via SUBCUTANEOUS
  Filled 2021-10-11 (×3): qty 0.15

## 2021-10-11 MED ORDER — ACETAMINOPHEN 325 MG PO TABS
650.0000 mg | ORAL_TABLET | ORAL | Status: DC | PRN
  Administered 2021-10-12 – 2021-10-14 (×3): 650 mg via ORAL
  Filled 2021-10-11 (×4): qty 2

## 2021-10-11 MED ORDER — DAPAGLIFLOZIN PROPANEDIOL 10 MG PO TABS
10.0000 mg | ORAL_TABLET | Freq: Every day | ORAL | Status: DC
  Administered 2021-10-12 – 2021-10-14 (×3): 10 mg via ORAL
  Filled 2021-10-11 (×4): qty 1

## 2021-10-11 MED ORDER — ONDANSETRON HCL 4 MG/2ML IJ SOLN: 4.0000 mg | Freq: Four times a day (QID) | INTRAMUSCULAR | Status: DC | PRN

## 2021-10-11 MED ORDER — LABETALOL HCL 5 MG/ML IV SOLN: 10.0000 mg | INTRAVENOUS | Status: DC | PRN

## 2021-10-11 MED ORDER — HEPARIN (PORCINE) IN NACL 1000-0.9 UT/500ML-% IV SOLN
INTRAVENOUS | Status: DC | PRN
  Administered 2021-10-11 (×2): 500 mL

## 2021-10-11 MED ORDER — MIDAZOLAM HCL 2 MG/2ML IJ SOLN
INTRAMUSCULAR | Status: DC | PRN
  Administered 2021-10-11: 1 mg via INTRAVENOUS

## 2021-10-11 MED ORDER — MIDAZOLAM HCL 2 MG/2ML IJ SOLN
INTRAMUSCULAR | Status: AC
  Filled 2021-10-11: qty 2

## 2021-10-11 MED ORDER — HEPARIN SODIUM (PORCINE) 1000 UNIT/ML IJ SOLN
INTRAMUSCULAR | Status: DC | PRN
  Administered 2021-10-11: 11000 [IU] via INTRAVENOUS

## 2021-10-11 SURGICAL SUPPLY — 19 items
BALL STERLING OTW 2.5X150X150 (BALLOONS) ×3
BALLN STERLING OTW 2.5X150X150 (BALLOONS) ×2
BALLOON STRLNG OTW 2.5X150X150 (BALLOONS) ×2 IMPLANT
CATH OMNI FLUSH 5F 65CM (CATHETERS) ×1 IMPLANT
CATH QUICKCROSS .018X135CM (MICROCATHETER) ×3 IMPLANT
CATH TEMPO AQUA 5F 100CM (CATHETERS) ×3 IMPLANT
KIT MICROPUNCTURE NIT STIFF (SHEATH) ×1 IMPLANT
KIT PV (KITS) ×3 IMPLANT
SHEATH FLEX ANSEL ANG 5F 45CM (SHEATH) ×1 IMPLANT
SHEATH PINNACLE 5F 10CM (SHEATH) ×4 IMPLANT
SHEATH PROBE COVER 6X72 (BAG) ×1 IMPLANT
STOPCOCK MORSE 400PSI 3WAY (MISCELLANEOUS) ×1 IMPLANT
SYR MEDRAD MARK 7 150ML (SYRINGE) ×3 IMPLANT
TRANSDUCER W/STOPCOCK (MISCELLANEOUS) ×3 IMPLANT
TRAY PV CATH (CUSTOM PROCEDURE TRAY) ×3 IMPLANT
TUBING CIL FLEX 10 FLL-RA (TUBING) ×1 IMPLANT
WIRE BENTSON .035X145CM (WIRE) ×3 IMPLANT
WIRE G V18X300CM (WIRE) ×3 IMPLANT
WIRE ROSEN-J .035X180CM (WIRE) ×1 IMPLANT

## 2021-10-11 NOTE — Progress Notes (Signed)
Podiatry Progress Note  Subjective: Jasmine Richardson is a 60 y.o. female patient seen at bedside, resting comfortably in no acute distress s/p day #1 Left incision and drainage with removal of infected soft tissue/necrotic tissue at amputation stump site and wound culture.  Patient admits a little pain at surgical site take Tylenol, denies calf pain, denies headache, chest pain, shortness of breath, nausea, vomitting, denies loss of appetite, denies problems with voiding. No other issues noted.   Patient Active Problem List   Diagnosis Date Noted   Cellulitis, wound, post-operative 10/09/2021   Abnormal mammogram of left breast 09/04/2021   Breast wound, right, subsequent encounter 09/04/2021   Demand ischemia ([place])    ICD (implantable cardioverter-defibrillator) in place 05/16/2021   Ventricular tachycardia 12/17/2020   Unstable angina ([place]) 05/22/2020   NSTEMI (non-ST elevated myocardial infarction) ([place]) 05/22/2020   Diabetic foot ulcer ([place]) 02/16/2020   Angina pectoris ([place]) 09/11/2017   Emphysema lung ([place]) 09/11/2017   Chronic diastolic heart failure ([place]) 11/06/2016   Mediastinitis 06/28/2016   Wound, surgical, infected 06/06/2016   S/P CABG (coronary artery bypass graft) 06/03/2016   Severe sepsis ([place]) 06/03/2016   GERD (gastroesophageal reflux disease) 05/08/2016   Morbid obesity ([place]) 05/08/2016   Type 2 diabetes mellitus with foot ulcer ([place]) 05/08/2016   Sinus tachycardia 05/08/2016   Acute chest pain 05/08/2016   Burping 05/08/2016   Tobacco use disorder 04/20/2016   Coronary artery disease involving native coronary artery of native heart with angina pectoris ([place]) 09/12/2015   Essential hypertension 09/12/2015   Hyperlipidemia 09/12/2015     Current Facility-Administered Medications:    acetaminophen (TYLENOL) tablet 650 mg, 650 mg, Oral, Q6H PRN, Kathryne Eriksson, NP, 650 mg at 10/11/21 0409   amiodarone (PACERONE) tablet 200 mg, 200 mg, Oral, Daily, Tu, Ching T,  DO   aspirin EC tablet 81 mg, 81 mg, Oral, Daily, Tu, Ching T, DO   clopidogrel (PLAVIX) tablet 75 mg, 75 mg, Oral, Daily, Tu, Ching T, DO   fenofibrate tablet 160 mg, 160 mg, Oral, Daily, Tu, Ching T, DO   furosemide (LASIX) tablet 20 mg, 20 mg, Oral, Daily, Tu, Ching T, DO   influenza vac split quadrivalent PF (FLUARIX) injection 0.5 mL, 0.5 mL, Intramuscular, Tomorrow-1000, Tu, Ching T, DO   insulin aspart (novoLOG) injection 0-20 Units, 0-20 Units, Subcutaneous, Q4H, Tu, Ching T, DO, 4 Units at 10/10/21 1250   irbesartan (AVAPRO) tablet 150 mg, 150 mg, Oral, Daily, Tu, Ching T, DO   isosorbide mononitrate (IMDUR) 24 hr tablet 30 mg, 30 mg, Oral, Daily, Tu, Ching T, DO   metoprolol succinate (TOPROL-XL) 24 hr tablet 25 mg, 25 mg, Oral, TID, Tu, Ching T, DO, 25 mg at 10/10/21 2154   mupirocin ointment (BACTROBAN) 2 % 1 application, 1 application, Nasal, BID, Tu, Ching T, DO, 1 application at 12/45/80 1327   mupirocin ointment (BACTROBAN) 2 %, , Topical, Daily, Tu, Ching T, DO, Given at 10/10/21 1327   pantoprazole (PROTONIX) EC tablet 40 mg, 40 mg, Oral, Daily, Tu, Ching T, DO   [COMPLETED] piperacillin-tazobactam (ZOSYN) IVPB 3.375 g, 3.375 g, Intravenous, Once, Stopped at 10/09/21 2100 **FOLLOWED BY** piperacillin-tazobactam (ZOSYN) IVPB 3.375 g, 3.375 g, Intravenous, Q8H, Heloise Purpura, RPH, Last Rate: 12.5 mL/hr at 10/11/21 0333, 3.375 g at 10/11/21 0333   rosuvastatin (CRESTOR) tablet 20 mg, 20 mg, Oral, QHS, Tu, Ching T, DO, 20 mg at 10/10/21 2154  Allergies  Allergen Reactions   Chlorhexidine Gluconate Itching  Received CHG bath, began itching, required benadryl   Cat Hair Extract Other (See Comments)    Sneezing, watery eyes.   Tramadol Other (See Comments)    Hallucinations   Vancomycin     Other reaction(s): Anaphylaxis*   Codeine Rash     Objective: Today's Vitals   10/10/21 1843 10/10/21 1950 10/10/21 2005 10/10/21 2033  BP: (!) 181/75 130/63 125/63 129/81   Pulse: 67 64 63 62  Resp: 18 19 (!) 21 16  Temp: 98.4 F (36.9 C) 98.6 F (37 C)  98.4 F (36.9 C)  TempSrc: Oral   Oral  SpO2: 97% 97% 95% 97%  Weight:      Height:      PainSc: 0-No pain 0-No pain  0-No pain    General: No acute distress  Left Lower extremity: Dressing to left foot clean, dry, intact. No strikethrough noted, Upon removal of dressings loose staples intact with no dehiscence open amputation stump wound granular base with fatty tissue and bone exposed no current necrosis at margins wound edges appear to be healthy and viable.  Decreasing erythema, decreasing edema, mild blood drainage.  Capillary fill time intact to all remaining lesser digits, no other acute signs of infection. No calf pain. Range of motion excluding surgical site within normal limits with no pain or crepitation.      Results for orders placed or performed during the hospital encounter of 10/09/21  Resp Panel by RT-PCR (Flu A&B, Covid) Nasopharyngeal Swab     Status: None   Collection Time: 10/09/21  6:51 PM   Specimen: Nasopharyngeal Swab; Nasopharyngeal(NP) swabs in vial transport medium  Result Value Ref Range Status   SARS Coronavirus 2 by RT PCR NEGATIVE NEGATIVE Final    Comment: (NOTE) SARS-CoV-2 target nucleic acids are NOT DETECTED.  The SARS-CoV-2 RNA is generally detectable in upper respiratory specimens during the acute phase of infection. The lowest concentration of SARS-CoV-2 viral copies this assay can detect is 138 copies/mL. A negative result does not preclude SARS-Cov-2 infection and should not be used as the sole basis for treatment or other patient management decisions. A negative result may occur with  improper specimen collection/handling, submission of specimen other than nasopharyngeal swab, presence of viral mutation(s) within the areas targeted by this assay, and inadequate number of viral copies(<138 copies/mL). A negative result must be combined with clinical  observations, patient history, and epidemiological information. The expected result is Negative.  Fact Sheet for Patients:  EntrepreneurPulse.com.au  Fact Sheet for Healthcare Providers:  IncredibleEmployment.be  This test is no t yet approved or cleared by the [place] FDA and  has been authorized for detection and/or diagnosis of SARS-CoV-2 by FDA under an Emergency Use Authorization (EUA). This EUA will remain  in effect (meaning this test can be used) for the duration of the COVID-19 declaration under Section 564(b)(1) of the Act, 21 U.S.C.section 360bbb-3(b)(1), unless the authorization is terminated  or revoked sooner.       Influenza A by PCR NEGATIVE NEGATIVE Final   Influenza B by PCR NEGATIVE NEGATIVE Final    Comment: (NOTE) The Xpert Xpress SARS-CoV-2/FLU/RSV plus assay is intended as an aid in the diagnosis of influenza from Nasopharyngeal swab specimens and should not be used as a sole basis for treatment. Nasal washings and aspirates are unacceptable for Xpert Xpress SARS-CoV-2/FLU/RSV testing.  Fact Sheet for Patients: EntrepreneurPulse.com.au  Fact Sheet for Healthcare Providers: IncredibleEmployment.be  This test is not yet approved or cleared by the [place]  States FDA and has been authorized for detection and/or diagnosis of SARS-CoV-2 by FDA under an Emergency Use Authorization (EUA). This EUA will remain in effect (meaning this test can be used) for the duration of the COVID-19 declaration under Section 564(b)(1) of the Act, 21 U.S.C. section 360bbb-3(b)(1), unless the authorization is terminated or revoked.  Performed at [facility], [place] [address] [place], [place] [address]   Blood culture (routine x 2)     Status: None (Preliminary result)   Collection Time: 10/09/21  7:31 PM   Specimen: BLOOD LEFT HAND  Result Value Ref Range Status   Specimen Description BLOOD  LEFT HAND  Final   Special Requests   Final    BOTTLES DRAWN AEROBIC AND ANAEROBIC Blood Culture results may not be optimal due to an inadequate volume of blood received in culture bottles   Culture   Final    NO GROWTH < 12 HOURS Performed at [facility], [place] [address] [place], [place] [address]    Report Status PENDING  Incomplete  Surgical PCR screen     Status: None   Collection Time: 10/10/21 12:04 PM   Specimen: Nasal Mucosa; Nasal Swab  Result Value Ref Range Status   MRSA, PCR NEGATIVE NEGATIVE Final   Staphylococcus aureus NEGATIVE NEGATIVE Final    Comment: (NOTE) The Xpert SA Assay (FDA approved for NASAL specimens in patients 81 years of age and older), is one component of a comprehensive surveillance program. It is not intended to diagnose infection nor to guide or monitor treatment. Performed at [facility], [place] [address], [place], [place] [address]   Aerobic/Anaerobic Culture w Gram Stain (surgical/deep wound)     Status: None (Preliminary result)   Collection Time: 10/10/21  7:28 PM   Specimen: Wound  Result Value Ref Range Status   Specimen Description WOUND LEFT FOOT  Final   Special Requests NONE  Final   Gram Stain   Final    FEW WBC PRESENT, PREDOMINANTLY MONONUCLEAR NO ORGANISMS SEEN Performed at [facility], [address] [place], [place] [address]    Culture PENDING  Incomplete   Report Status PENDING  Incomplete     Assessment and Plan:  Problem List Items Addressed This Visit       Other   * (Principal) Cellulitis, wound, post-operative   Other Visit Diagnoses     Postoperative infection, unspecified type, initial encounter    -  Primary   Relevant Medications   DAPTOmycin (CUBICIN) 350 mg in sodium chloride 0.9 % IVPB (Completed)   influenza vac split quadrivalent PF (FLUARIX) injection 0.5 mL (Start on 10/11/2021 10:00 AM)   mupirocin ointment (BACTROBAN) 2 % 1 application   mupirocin ointment (BACTROBAN) 2 %    Cellulitis of left foot           -Patient seen and evaluated at bedside -Chart reviewed -Recommend vascular consult decreased monophasic flow to the foot as noted on vascular ultrasound arterial duplex -Dressing change performed applied packing and dry dressing to the left foot -Advised patient to make sure to keep dressing clean, dry, and intact to Left foot allowing nursing to reinforce as ordered if there is bloody strikethrough -Awaiting intra-op cultures; continue with IV antibiotics until cultures have resulted  -Partial weightbearing/weightbearing to heel with post op shoe for bedside and bathroom only with assistance; recommend PT to assess patient prior to discharge for mobility needs -Continue with PRN meds, rest, and  elevation to assist with pain and edema control  -Recommend dietitian/diabetic educator to assist patient with postoperative meal regimen for wound healing as well as proper food choices to help with glycemic control/elevated A1c -Podiatry will continue to follow closely  -Anticipated discharge plan of care: To home in 2 days or so pending improvement with cellulitis and leukocytosis with antibiotics based on culture results and home nursing; will require home nursing to help with dressing changes every other day applying packing and dry dressing to the left foot.   Dr. Landis Martins, DPM  [facility] [phone] office [phone] cell Available via secure chat

## 2021-10-11 NOTE — Progress Notes (Signed)
Site area: rt groin fa sheath pulled by Wynonia Sours, RN Site Prior to Removal:  Level 0 Pressure Applied For: 20 minutes Manual:   stable Patient Status During Pull:  stable Post Pull Site:  Level 0 Post Pull Instructions Given:  yes Post Pull Pulses Present: rt dp 1+ palpable Dressing Applied:  gauze and tegaderm Bedrest begins @ 2015 Comments:

## 2021-10-11 NOTE — Progress Notes (Signed)
Mobility Specialist Progress Note    10/11/21 1226  Mobility  Activity Ambulated in hall  Level of Assistance Modified independent, requires aide device or extra time  Assistive Device Front wheel walker  Distance Ambulated (ft) 135 ft  Mobility Ambulated with assistance in hallway  Mobility Response Tolerated well  Mobility performed by Mobility specialist  Bed Position Chair  $Mobility charge 1 Mobility   Pt received in bed with RN present and agreeable. No complaints on walk. Returned to chair with call bell in reach.  Hildred Alamin Mobility Specialist  Mobility Specialist Phone: [phone]

## 2021-10-11 NOTE — Progress Notes (Addendum)
PROGRESS NOTE   Jasmine Richardson  GGY:[id number] DOB: 30-Jul-1961 DOA: 10/09/2021 PCP: Nicholos Johns, MD  Brief Narrative:  60 year old community dwelling female CABG 0350  complicated by neuritis status post myocutaneous flaps--CAD status post stent 06/22/2020--repeat hospitalization for chest pain 07/22/2021 with Showing no new coronary occlusion, underlying VT status post ICD placement [facility] 01/05/2021 CKD 3 HTN DM TY 2 Seen 9/30 Dr. Cannon Kettle for toe plantar ulceration and cellulitis- had left great toe amputation 10/19 ABI was 0.82 10/07/2019 and was discharged on Bactrim Reviewed on dressing changes and noted necrotic tissue and streaking redness to surgical site patient was admitted 10/24--CT foot showed a posterior soft tissue edema Dr. Cannon Kettle of podiatry saw the patient developed  Hospital-Problem based course  Cellulitis necrosis of amputation site with group B strep Status post left foot incision and I&D Await deep wound culture 10/25-narrow antibiotics as per them Zosyn for now On Tylenol only at this time Vascular surgery has been consulted given decreased significantly and patient will eventually be going for angiography Significant CAD history as above Continue aspirin 81 daily, Plavix 75 50 daily Imdur 30 daily metoprolol 25 XL 3 times daily Crestor 20 nightly aspirin 81 Outpatient can resume [place] VT status post PPM 2022 Continue amiodarone 200 daily keep on monitors HFpEF Lasix held at this time-resume in the outpatient setting DM TY 2 A1c 12.8 CBGs ranging 99-180, eating 100% of meals Resume metformin 500 twice daily in am post angio Takes tresiba 45 units of insulin at home, Humalog 20 in the evening--we will start Lantus 15 units this evening Resumed Farxiga    DVT prophylaxis: Lovenox Code Status: Presumed full Family Communication: None present Disposition:  Status is: Inpatient  Remains inpatient appropriate because:   Need for further work-up     Consultants:  Podiatry Vascular surgery  Procedures: As above  Antimicrobials: Currently Zosyn   Subjective: Awake coherent no distress looks well he does not feel any pain as she probably has insensate neuropathy Tells me she has had diabetes for about 10 years No chest pain  Objective: Vitals:   10/10/21 1950 10/10/21 2005 10/10/21 2033 10/11/21 0744  BP: 130/63 125/63 129/81 139/71  Pulse: 64 63 62 65  Resp: 19 (!) 21 16 16   Temp: 98.6 F (37 C)  98.4 F (36.9 C) 98.9 F (37.2 C)  TempSrc:   Oral Oral  SpO2: 97% 95% 97% 93%  Weight:      Height:        Intake/Output Summary (Last 24 hours) at 10/11/2021 1003 Last data filed at 10/11/2021 0700 Gross per 24 hour  Intake 478.13 ml  Output 5 ml  Net 473.13 ml   Filed Weights   10/10/21 0026  Weight: 113.4 kg    Examination:  EOMI NCAT thick neck  S1-S2 no murmur no rub no gallop CTA B no added sound no rales no rhonchi Abdomen soft nontender no rebound no guarding cannot appreciate any organomegaly Lower extremity is wrapped with reinforce dressing wound was not examined Neurologically seems intact Psych is euthymic congruent   Data Reviewed: personally reviewed   CBC    Component Value Date/Time   WBC 13.2 (H) 10/11/2021 0343   RBC 3.59 (L) 10/11/2021 0343   HGB 9.6 (L) 10/11/2021 0343   HGB 10.4 (L) 06/17/2020 0933   HCT 31.6 (L) 10/11/2021 0343   HCT 32.4 (L) 06/17/2020 0933   PLT 486 (H) 10/11/2021 0343   PLT 269 06/17/2020 0933   MCV 88.0 10/11/2021  0343   MCV 86 06/17/2020 0933   MCH 26.7 10/11/2021 0343   MCHC 30.4 10/11/2021 0343   RDW 13.3 10/11/2021 0343   RDW 14.2 06/17/2020 0933   LYMPHSABS 1.9 10/09/2021 1950   MONOABS 0.4 10/09/2021 1950   EOSABS 0.1 10/09/2021 1950   BASOSABS 0.0 10/09/2021 1950   CMP Latest Ref Rng & Units 10/11/2021 10/10/2021 10/09/2021  Glucose 70 - 99 mg/dL 188(H) 143(H) 166(H)  BUN 8 - 23 mg/dL 9 11 11   Creatinine 0.44 - 1.00 mg/dL 1.08(H) 0.93 0.90   Sodium 135 - 145 mmol/L 136 136 134(L)  Potassium 3.5 - 5.1 mmol/L 4.5 3.5 4.2  Chloride 98 - 111 mmol/L 104 103 101  CO2 22 - 32 mmol/L 24 23 22   Calcium 8.9 - 10.3 mg/dL 9.1 9.0 9.1  Total Protein 6.5 - 8.1 g/dL - - 7.4  Total Bilirubin 0.3 - 1.2 mg/dL - - 0.5  Alkaline Phos 38 - 126 U/L - - 53  AST 15 - 41 U/L - - 24  ALT 0 - 44 U/L - - 16     Radiology Studies: CT FOOT LEFT W CONTRAST  Result Date: 10/09/2021 CLINICAL DATA:  Concern for osteomyelitis. Redness and swelling of the first digit in a patient status post recent transmetatarsal amputation of the left first digit EXAM: CT OF THE LOWER LEFT EXTREMITY WITH CONTRAST TECHNIQUE: Multidetector CT imaging of the lower left extremity was performed according to the standard protocol following intravenous contrast administration. CONTRAST:  73mL OMNIPAQUE IOHEXOL 350 MG/ML SOLN COMPARISON:  X-ray left foot 10/03/2021, x-ray left foot 10/09/2021 FINDINGS: Bones/Joint/Cartilage Status post transmetatarsal amputation of the left first digit. No cortical erosion or destruction. No acute displaced fracture. No dislocation. No effusion. Ligaments Suboptimally assessed by CT. Muscles and Tendons Grossly unremarkable. Soft tissues Subcutaneus soft tissue edema and emphysema of the first digit with overlying skin staples. No organized fluid collection. Mild subcutaneus soft tissue edema along the dorsum of the foot. No retained radiopaque foreign body. IMPRESSION: No CT findings to suggest osteomyelitis in a patient with subcutaneus soft tissue edema and emphysema along a recent first digit transmetatarsal amputation. Findings may be postsurgical with superimposed infection not excluded. Please note necrotizing fasciitis is a clinical diagnosis and cannot be excluded. No organized fluid collection. No retained radiopaque foreign body. Skin staples are noted overlying the wound. Electronically Signed   By: Iven Finn M.D.   On: 10/09/2021 23:16    DG Chest Portable 1 View  Result Date: 10/09/2021 CLINICAL DATA:  Postoperative shortness of breath. EXAM: PORTABLE CHEST 1 VIEW COMPARISON:  Chest x-ray 10/03/2021. FINDINGS: Lung volumes are low. There is some bibasilar atelectasis. The lungs are otherwise clear. There is no pleural effusion or pneumothorax. There is a small calcified nodule in the right upper lobe, unchanged. The heart is mildly enlarged, unchanged. Left-sided pacemaker present. There are surgical clips overlying the left upper chest. No acute fractures are seen. IMPRESSION: No active disease. Electronically Signed   By: Ronney Asters M.D.   On: 10/09/2021 20:27   DG Foot Complete Left  Result Date: 10/09/2021 CLINICAL DATA:  Left foot pain, status post great toe amputation EXAM: LEFT FOOT - COMPLETE 3+ VIEW COMPARISON:  10/04/2021 FINDINGS: Status post transmetatarsal amputation of the left great toe with overlying skin staples. No fracture or dislocation of the left foot. Diffuse soft tissue edema. IMPRESSION: 1. Status post transmetatarsal amputation of the left great toe with overlying skin staples. 2.  No fracture  or dislocation of the left foot. 3.  Diffuse soft tissue edema. Electronically Signed   By: Delanna Ahmadi M.D.   On: 10/09/2021 13:53   VAS [place] LOWER EXTREMITY ARTERIAL DUPLEX (7a-7p)  Result Date: 10/10/2021 LOWER EXTREMITY ARTERIAL DUPLEX STUDY Patient Name:  EDMUND RICK  Date of Exam:   10/10/2021 Medical Rec #: [id number]      Accession #:    [id number] Date of Birth: 10-08-1961     Patient Gender: F Patient Age:   74 years Exam Location:  [facility] Procedure:      VAS [place] LOWER EXTREMITY ARTERIAL DUPLEX Referring Phys: Vonna Kotyk ZAVITZ --------------------------------------------------------------------------------  Indications: Left transmetatarsal amputation and post op pain. High Risk Factors: Hypertension, hyperlipidemia, Diabetes, coronary artery                    disease.  Current ABI: n/a  Comparison Study: no prior Performing Technologist: Archie Patten RVS  Examination Guidelines: A complete evaluation includes B-mode imaging, spectral Doppler, color Doppler, and power Doppler as needed of all accessible portions of each vessel. Bilateral testing is considered an integral part of a complete examination. Limited examinations for reoccurring indications may be performed as noted.   +-----------+--------+-----+--------+----------+--------------+ LEFT       PSV cm/sRatioStenosisWaveform  Comments       +-----------+--------+-----+--------+----------+--------------+ CFA Prox   104                  triphasic                +-----------+--------+-----+--------+----------+--------------+ DFA        172                  triphasic                +-----------+--------+-----+--------+----------+--------------+ SFA Prox   138                  biphasic                 +-----------+--------+-----+--------+----------+--------------+ SFA Mid    113                  biphasic                 +-----------+--------+-----+--------+----------+--------------+ SFA Distal 69                   biphasic                 +-----------+--------+-----+--------+----------+--------------+ POP Distal 63                   biphasic                 +-----------+--------+-----+--------+----------+--------------+ TP Trunk   88                   biphasic                 +-----------+--------+-----+--------+----------+--------------+ ATA Distal 60                   monophasic               +-----------+--------+-----+--------+----------+--------------+ PTA Distal 34                   monophasic               +-----------+--------+-----+--------+----------+--------------+ PERO Distal  not visualized +-----------+--------+-----+--------+----------+--------------+  Summary: Left: Distal anterior tibial artery and distal posterior tibial  artery have decreased monophasic flow, otherwise no obvious evidence of stenosis noted in the left lower extremity.  See table(s) above for measurements and observations. Electronically signed by Harold Barban MD on 10/10/2021 at 9:17:16 PM.    Final      Scheduled Meds:  amiodarone  200 mg Oral Daily   aspirin EC  81 mg Oral Daily   clopidogrel  75 mg Oral Daily   fenofibrate  160 mg Oral Daily   furosemide  20 mg Oral Daily   influenza vac split quadrivalent PF  0.5 mL Intramuscular Tomorrow-1000   insulin aspart  0-20 Units Subcutaneous Q4H   irbesartan  150 mg Oral Daily   isosorbide mononitrate  30 mg Oral Daily   metoprolol succinate  25 mg Oral TID   mupirocin ointment  1 application Nasal BID   mupirocin ointment   Topical Daily   pantoprazole  40 mg Oral Daily   rosuvastatin  20 mg Oral QHS   Continuous Infusions:  piperacillin-tazobactam (ZOSYN)  IV 3.375 g (10/11/21 0333)     LOS: 2 days   Time spent: [place], MD [facility] To contact the attending provider between 7A-7P or the covering provider during after hours 7P-7A, please log into the web site [email or web address] and access using universal [place] password for that web site. If you do not have the password, please call the hospital operator.  10/11/2021, 10:03 AM

## 2021-10-11 NOTE — Op Note (Signed)
Patient name: Jasmine Richardson MRN: [id number] DOB: 1961/04/05 Sex: female  10/11/2021 Pre-operative Diagnosis: Critical limb ischemia of the left lower extremity with tissue loss Post-operative diagnosis:  Same Surgeon:  Marty Heck, MD Procedure Performed: 1.  Ultrasound-guided access right common femoral artery 2.  Aortogram with catheter selection of aorta 3.  Left lower extremity arteriogram with selection of third order branches 4.  Left anterior tibial artery angioplasty (2.5 mm x 150 mm Sterling) 5.  55 minutes of monitored moderate conscious sedation time  Indications: Patient is a 60 year old female seen after she underwent I&D of a nonhealing left hallux amputation at [facility].  She had monophasic runoff distally with no palpable pedal pulses.  We recommended lower extremity arteriogram with possible intervention after risk benefits discussed.  Findings:   Aortogram showed patent renal arteries bilaterally.  The infrarenal aorta and bilateral iliacs were widely patent with no flow limiting stenosis.  Left lower extremity runoff showed a patent common femoral and patent but diseased profunda.  SFA was widely patent.  Above and below-knee popliteal artery widely patent.  She does have tibial disease.  Dominant runoff is in the anterior tibial and peroneal.  The posterior tibial appears to occlude shortly after takeoff.  Anterior tibial had multiple high-grade stenosis >80% throughout the proximal to mid segment and did have in-line flow to a dorsalis pedis into the foot.  The peroneal was diseased and very small in the mid to distal calf.  The left anterior tibial was selected and the multi-focal high-grade stenosis was crossed antegrade.  This was treated with a 2.5 mm x 150 mm Sterling to nominal pressure for 2 minutes.  Excellent results with no residual stenosis.  Preserved runoff.  I elected not to treat the peroneal given this was very small and I thought would be high  risk of damaging the vessel.   Procedure:  The patient was identified in the holding area and taken to room 8.  The patient was then placed supine on the table and prepped and draped in the usual sterile fashion.  A time out was called.  Ultrasound was used to evaluate the right common femoral artery.  It was patent .  A digital ultrasound image was acquired.  A micropuncture needle was used to access the right common femoral artery under ultrasound guidance.  An 018 wire was advanced without resistance and a micropuncture sheath was placed.  The 018 wire was removed and a benson wire was placed.  The micropuncture sheath was exchanged for a 5 french sheath.  An omniflush catheter was advanced over the wire to the level of L-1.  An abdominal angiogram was obtained.  Next, using the omniflush catheter and a benson wire, the aortic bifurcation was crossed and the catheter was placed into theleft external iliac artery and left runoff was obtained.  After evaluating images elected to intervene on her tibial disease.  We used a Rosen wire down the left SFA and exchanged for a long 5 [place] Ansell sheath in the right groin over the aortic bifurcation.  Patient was given 100 units per kilogram IV heparin.  We then used the straight 100 cm flush catheter to get a wire down into the popliteal artery.  We exchanged for a V 18 with a 018 quick cross.  We were able to cannulate the anterior tibial antegrade and crossed all the high-grade stenosis and got a wire to the distal ankle.  We then elected to angioplasty all the  stenosis in the proximal to mid anterior tibial artery with a 2.5 mm x 150 mm Sterling to nominal pressure for 2 minutes.  Excellent results with no residual stenosis.  Preserved runoff distally into the foot through the dorsalis pedis.  Wires and catheters were removed.  I did get a groin access shot.  I elected not to close.  Taken to holding to have manual pressure and sheath removal.   Plan: Patient  will need aspirin Plavix statin.  She is optimized from a vascular standpoint.  Marty Heck, MD [facility] Office: [place]

## 2021-10-11 NOTE — Plan of Care (Signed)

## 2021-10-11 NOTE — Consult Note (Signed)
Hospital Consult    Reason for Consult: Evaluate left foot wound Referring Physician: Podiatry MRN #:  [id number]  History of Present Illness: This is a 60 y.o. female with history of hypertension, hyperlipidemia, diabetes, coronary artery disease status post CABG that vascular surgery has been asked to evaluate left foot wound. She underwent I&D of a necrotic amputation stump on the left foot yesterday by Dr. Cannon Kettle with podiatry.  Prior to this she had a amputation of the left hallux on 10/04/2021 at [facility].  She denies any previous lower extremity vascular interventions.  She had left lower extremity arterial duplex yesterday that showed no visualized stenosis but monophasic runoff distally in the tibial vessels.     Past Medical History:  Diagnosis Date   Acute chest pain 05/08/2016   Angina pectoris ([place]) 09/11/2017   Burping 05/08/2016   Chronic diastolic heart failure ([place]) 11/06/2016   Coronary artery disease involving native coronary artery of native heart with angina pectoris ([place]) 09/12/2015   Overview:  PCI and stent of RCA 2009, last cath 2012 with medical therapy  CABG May 2017   Emphysema lung ([place]) 09/11/2017   Essential hypertension 09/12/2015   GERD (gastroesophageal reflux disease) 05/08/2016   Hyperlipidemia 09/12/2015   Mediastinitis 06/28/2016   Morbid obesity ([place]) 05/08/2016   S/P CABG (coronary artery bypass graft) 06/03/2016   Overview:  The patient underwent sternal reconstruction on 06/21/16 with pec flaps for mediastinitis from a prior CABG in May 2017. On admission, she was critically ill from sepsis and had altered mental status. She was last seen in clinic on 08/09/16 at which time she was doing well.   Severe sepsis ([place]) 06/03/2016   Sinus tachycardia 05/08/2016   Tobacco use disorder 04/20/2016   Overview:  Quit in May 2017.   Uncontrolled type 2 diabetes mellitus 05/08/2016   Wound, surgical, infected 06/06/2016   Overview:  sternal    Past Surgical History:   Procedure Laterality Date   AMPUTATION TOE Right 02/20/2020   Procedure: AMPUTATION RIGHT GREAT  TOE;  Surgeon: Felipa Furnace, DPM;  Location: [facility];  Service: Podiatry;  Laterality: Right;   BLADDER SURGERY     CARDIAC CATHETERIZATION     CORONARY ARTERY BYPASS GRAFT     CORONARY STENT INTERVENTION N/A 05/25/2020   Procedure: CORONARY STENT INTERVENTION;  Surgeon: Wellington Hampshire, MD;  Location: [facility];  Service: Cardiovascular;  Laterality: N/A;   CORONARY STENT INTERVENTION N/A 06/22/2020   Procedure: CORONARY STENT INTERVENTION;  Surgeon: Leonie Man, MD;  Location: [facility];  Service: Cardiovascular;  Laterality: N/A;   ICD IMPLANT N/A 12/19/2020   Procedure: ICD IMPLANT;  Surgeon: Evans Lance, MD;  Location: [facility];  Service: Cardiovascular;  Laterality: N/A;   INCISION AND DRAINAGE Right 02/19/2020   Procedure: INCISION AND DRAINAGE;  Surgeon: Trula Slade, DPM;  Location: [facility];  Service: Podiatry;  Laterality: Right;  Block done by surgeon   INTRAVASCULAR ULTRASOUND/IVUS N/A 06/22/2020   Procedure: Intravascular Ultrasound/IVUS;  Surgeon: Leonie Man, MD;  Location: [facility];  Service: Cardiovascular;  Laterality: N/A;   IRRIGATION AND DEBRIDEMENT FOOT Left 10/10/2021   Procedure: IRRIGATION AND DEBRIDEMENT FOOT;  Surgeon: Landis Martins, DPM;  Location: [facility];  Service: Podiatry;  Laterality: Left;   LEFT HEART CATH AND CORS/GRAFTS ANGIOGRAPHY N/A 05/23/2020   Procedure: LEFT HEART CATH AND CORS/GRAFTS ANGIOGRAPHY;  Surgeon: Nelva Bush, MD;  Location: [facility];  Service: Cardiovascular;  Laterality: N/A;   LEFT HEART CATH AND CORS/GRAFTS ANGIOGRAPHY N/A 12/19/2020   Procedure: LEFT HEART CATH AND CORS/GRAFTS ANGIOGRAPHY;  Surgeon: Burnell Blanks, MD;  Location: [facility];  Service: Cardiovascular;  Laterality: N/A;   LEFT HEART CATH AND CORS/GRAFTS ANGIOGRAPHY N/A 07/21/2021   Procedure: LEFT HEART  CATH AND CORS/GRAFTS ANGIOGRAPHY;  Surgeon: Nelva Bush, MD;  Location: [facility];  Service: Cardiovascular;  Laterality: N/A;   METATARSAL HEAD EXCISION Right 05/02/2020   Procedure: FIRST METATARSAL HEAD RESECTION; RIGHT FOOT WOUND CLOSURE;  Surgeon: Landis Martins, DPM;  Location: [facility];  Service: Podiatry;  Laterality: Right;  MAC W/LOCAL   TUBAL LIGATION      Allergies  Allergen Reactions   Chlorhexidine Gluconate Itching    Received CHG bath, began itching, required benadryl   Cat Hair Extract Other (See Comments)    Sneezing, watery eyes.   Tramadol Other (See Comments)    Hallucinations   Vancomycin     Other reaction(s): Anaphylaxis*   Codeine Rash    Prior to Admission medications   Medication Sig Start Date End Date Taking? Authorizing Provider  amiodarone (PACERONE) 200 MG tablet Take 1 tablet (200 mg total) by mouth daily. 07/22/21 10/20/21 Yes Timothy Lasso, MD  amoxicillin-clavulanate (AUGMENTIN) 875-125 MG tablet Take 1 tablet by mouth 2 (two) times daily. 08/15/21  Yes [provider]  aspirin EC 81 MG tablet Take 1 tablet (81 mg total) by mouth daily. 07/22/21 10/20/21 Yes Timothy Lasso, MD  cholecalciferol (VITAMIN D3) 25 MCG (1000 UNIT) tablet Take 1,000 Units by mouth 2 (two) times a week.   Yes [provider]  clopidogrel (PLAVIX) 75 MG tablet Take 1 tablet (75 mg total) by mouth daily. 07/22/21 10/20/21 Yes Timothy Lasso, MD  cyanocobalamin 1000 MCG tablet Take 1,000 mcg by mouth daily.   Yes [provider]  dapagliflozin propanediol (FARXIGA) 10 MG TABS tablet Take 1 tablet (10 mg total) by mouth daily before breakfast. 07/22/21 10/20/21 Yes Timothy Lasso, MD  Evolocumab (REPATHA SURECLICK) 417 MG/ML SOAJ Inject 140 mg into the skin every 14 (fourteen) days. 08/23/21  Yes Richardo Priest, MD  fenofibrate (TRICOR) 145 MG tablet Take 1 tablet (145 mg total) by mouth daily. 07/22/21 10/20/21 Yes Timothy Lasso, MD  furosemide (LASIX) 20 MG tablet Take 20 mg by mouth daily. 10/07/21  Yes [provider]  HUMALOG KWIKPEN 100 UNIT/ML KwikPen Inject 10-50 Units into the skin at bedtime. Sliding scale 09/09/21  Yes [provider]  LORazepam (ATIVAN) 1 MG tablet Take 1 mg by mouth 2 (two) times daily as needed for anxiety.   Yes [provider]  metFORMIN (GLUCOPHAGE) 500 MG tablet Take 1 tablet (500 mg total) by mouth 2 (two) times daily. 07/22/21 10/20/21 Yes Timothy Lasso, MD  metoprolol succinate (TOPROL-XL) 50 MG 24 hr tablet Take 50 mg by mouth in the morning, at noon, and at bedtime. 01/22/20  Yes [provider]  nitroGLYCERIN (NITROSTAT) 0.4 MG SL tablet Place 1 tablet (0.4 mg total) under the tongue every 5 (five) minutes as needed for chest pain. 07/22/21 10/20/21 Yes Timothy Lasso, MD  nystatin (MYCOSTATIN/NYSTOP) powder Apply 1 application topically daily as needed (rash/yeast).   Yes [provider]  omeprazole (PRILOSEC) 20 MG capsule Take 1 capsule (20 mg total) by mouth daily. 07/22/21 10/20/21 Yes Timothy Lasso, MD  rosuvastatin (CRESTOR) 20 MG tablet Take 1 tablet (20 mg total) by mouth at  bedtime. 07/22/21 10/20/21 Yes Timothy Lasso, MD  TRESIBA FLEXTOUCH 100 UNIT/ML FlexTouch Pen Inject 45 Units into the skin daily. 07/22/21 10/20/21 Yes Timothy Lasso, MD  vitamin C (ASCORBIC ACID) 250 MG tablet Take 250 mg by mouth at bedtime.   Yes [provider]    Social History   Socioeconomic History   Marital status: Married    Spouse name: Not on file   Number of children: Not on file   Years of education: Not on file   Highest education level: Not on file  Occupational History   Not on file  Tobacco Use   Smoking status: Former    Types: Cigarettes    Quit date: 05/2016    Years since quitting: 5.4   Smokeless tobacco: Never  Vaping Use   Vaping Use: Never used  Substance and Sexual Activity   Alcohol use: No   Drug use:  No   Sexual activity: Yes  Other Topics Concern   Not on file  Social History Narrative   Not on file   Social Determinants of Health   Financial Resource Strain: Not on file  Food Insecurity: Not on file  Transportation Needs: Not on file  Physical Activity: Not on file  Stress: Not on file  Social Connections: Not on file  Intimate Partner Violence: Not on file     Family History  Problem Relation Age of Onset   Hypertension Mother    Hyperlipidemia Mother    Heart attack Father    Heart disease Father    Hypertension Father    Alzheimer's disease Father    Hypertension Brother    Hyperlipidemia Brother     ROS: _0  Positive   _1  Negative   _2  All sytems reviewed and are negative  Cardiovascular: _3  chest pain/pressure _4  palpitations _5  SOB lying flat _6  DOE _7  pain in legs while walking _8  pain in legs at rest _9  pain in legs at night _10  non-healing ulcers _11  hx of DVT _12  swelling in legs  Pulmonary: _13  productive cough _14  asthma/wheezing _15  home O2  Neurologic: _16  weakness in _17  arms _18  legs _19  numbness in _20  arms _21  legs _22  hx of CVA _23  mini stroke _24 difficulty speaking or slurred speech _25  temporary loss of vision in one eye _26  dizziness  Hematologic: _27  hx of cancer _28  bleeding problems _29  problems with blood clotting easily  Endocrine:   _30  diabetes _31  thyroid disease  GI _32  vomiting blood _33  blood in stool  GU: _34  CKD/renal failure _35  HD--_36  M/W/F or _37  T/T/S _38  burning with urination _39  blood in urine  Psychiatric: _40  anxiety _41  depression  Musculoskeletal: _42  arthritis _43  joint pain  Integumentary: _44  rashes _45  ulcers  Constitutional: _46  fever _47  chills   Physical Examination  Vitals:   10/10/21 2033 10/11/21 0744  BP: 129/81 139/71  Pulse: 62 65  Resp: 16 16  Temp: 98.4 F (36.9 C) 98.9 F (37.2 C)  SpO2: 97% 93%   Body mass index is 39.16 kg/m.  General:  NAD Gait: Not observed HENT: WNL,  normocephalic Pulmonary: normal non-labored breathing, without Rales, rhonchi,  wheezing Cardiac: regular, without  Murmurs, rubs or gallops Abdomen: soft, NT/ND Vascular Exam/Pulses: Bilateral femoral pulses palpable Open left foot wound as pictured below I cannot palpate any pedal pulses on the left foot Musculoskeletal: no muscle wasting or atrophy  Neurologic: A&O X 3; Appropriate Affect ; SENSATION: normal; MOTOR FUNCTION:  moving all extremities equally. Speech is fluent/normal  CBC    Component Value Date/Time   WBC 13.2 (H) 10/11/2021 0343   RBC 3.59 (L) 10/11/2021 0343   HGB 9.6 (L) 10/11/2021 0343   HGB 10.4 (L) 06/17/2020 0933   HCT 31.6 (L) 10/11/2021 0343   HCT 32.4 (L) 06/17/2020 0933   PLT 486 (H) 10/11/2021 0343   PLT 269 06/17/2020 0933   MCV 88.0 10/11/2021 0343   MCV 86 06/17/2020 0933   MCH 26.7 10/11/2021 0343   MCHC 30.4 10/11/2021 0343   RDW 13.3 10/11/2021 0343   RDW 14.2 06/17/2020 0933   LYMPHSABS 1.9 10/09/2021 1950   MONOABS 0.4 10/09/2021 1950   EOSABS 0.1 10/09/2021 1950   BASOSABS 0.0 10/09/2021 1950    BMET    Component Value Date/Time   NA 136 10/11/2021 0343   NA 132 (L) 02/14/2021 1042   K 4.5 10/11/2021 0343   CL 104 10/11/2021 0343   CO2 24 10/11/2021 0343   GLUCOSE 188 (H) 10/11/2021 0343   BUN 9 10/11/2021 0343   BUN 24 02/14/2021 1042   CREATININE 1.08 (H) 10/11/2021 0343   CALCIUM 9.1 10/11/2021 0343   GFRNONAA 58 (L) 10/11/2021 0343   GFRAA 67 01/12/2021 1013    COAGS: Lab Results  Component Value Date   INR 1.1 07/20/2021   INR 1.1 05/22/2020   INR 1.2 02/16/2020     Non-Invasive Vascular Imaging:    Left lower extremity arterial duplex reviewed and monophasic runoff distally in the AT PT with no visualized stenosis.   ASSESSMENT/PLAN: This is a 60 y.o. female with multiple underlying risk factors including hypertension, diabetes, hyperlipidemia that presented with left foot cellulitis and necrosis of  her amputation stump and underwent I&D by podiatry yesterday.  I cannot appreciate any palpable pedal pulses after taking her dressings down.  Arterial duplex shows monophasic runoff distally in the tibials.  I do think she would benefit from aortogram with lower extremity arteriogram and possible intervention.  Discussed we will arrange this in the Cath Lab either today or tomorrow given the schedule is quite full.  Discussed risk and benefits.  Please keep NPO.  She may have all small vessel disease and nothing amenable to intervention but I think it would warrant further evaluation to give her the best chance at limb salvage.  Marty Heck, MD [facility] Office: [place]

## 2021-10-12 ENCOUNTER — Encounter (HOSPITAL_COMMUNITY): Payer: Self-pay | Admitting: Vascular Surgery

## 2021-10-12 DIAGNOSIS — Z9862 Peripheral vascular angioplasty status: Secondary | ICD-10-CM

## 2021-10-12 LAB — CBC
HCT: 30.3 % — ABNORMAL LOW (ref 36.0–46.0)
Hemoglobin: 9.4 g/dL — ABNORMAL LOW (ref 12.0–15.0)
MCH: 27 pg (ref 26.0–34.0)
MCHC: 31 g/dL (ref 30.0–36.0)
MCV: 87.1 fL (ref 80.0–100.0)
Platelets: 460 10*3/uL — ABNORMAL HIGH (ref 150–400)
RBC: 3.48 MIL/uL — ABNORMAL LOW (ref 3.87–5.11)
RDW: 13.4 % (ref 11.5–15.5)
WBC: 12.4 10*3/uL — ABNORMAL HIGH (ref 4.0–10.5)
nRBC: 0 % (ref 0.0–0.2)

## 2021-10-12 LAB — BASIC METABOLIC PANEL
Anion gap: 6 (ref 5–15)
BUN: 9 mg/dL (ref 8–23)
CO2: 24 mmol/L (ref 22–32)
Calcium: 9 mg/dL (ref 8.9–10.3)
Chloride: 106 mmol/L (ref 98–111)
Creatinine, Ser: 0.98 mg/dL (ref 0.44–1.00)
GFR, Estimated: >60 mL/min (ref >60)
Glucose, Bld: 142 mg/dL — ABNORMAL HIGH (ref 70–99)
Potassium: 4 mmol/L (ref 3.5–5.1)
Sodium: 136 mmol/L (ref 135–145)

## 2021-10-12 LAB — GLUCOSE, CAPILLARY
Glucose-Capillary: 132 mg/dL — ABNORMAL HIGH (ref 70–99)
Glucose-Capillary: 149 mg/dL — ABNORMAL HIGH (ref 70–99)
Glucose-Capillary: 170 mg/dL — ABNORMAL HIGH (ref 70–99)
Glucose-Capillary: 171 mg/dL — ABNORMAL HIGH (ref 70–99)
Glucose-Capillary: 188 mg/dL — ABNORMAL HIGH (ref 70–99)
Glucose-Capillary: 208 mg/dL — ABNORMAL HIGH (ref 70–99)

## 2021-10-12 MED ORDER — AMOXICILLIN-POT CLAVULANATE 875-125 MG PO TABS
1.0000 | ORAL_TABLET | Freq: Two times a day (BID) | ORAL | Status: DC
  Administered 2021-10-12 – 2021-10-14 (×5): 1 via ORAL
  Filled 2021-10-12 (×5): qty 1

## 2021-10-12 NOTE — Progress Notes (Addendum)
Podiatry Progress Note  Subjective: Jasmine Richardson is a 60 y.o. female patient seen at bedside, resting comfortably in no acute distress s/p day #2 left incision and drainage with removal of infected soft tissue/necrotic tissue at amputation stump site and wound culture.  Patient is also status post vascular intervention, 10/11/2021.  Patient reports that she is doing good no significant pain or discomfort at this time.  No other issues noted.   Patient Active Problem List   Diagnosis Date Noted   Cellulitis, wound, post-operative 10/09/2021   Abnormal mammogram of left breast 09/04/2021   Breast wound, right, subsequent encounter 09/04/2021   Demand ischemia ([place])    ICD (implantable cardioverter-defibrillator) in place 05/16/2021   Ventricular tachycardia 12/17/2020   Unstable angina ([place]) 05/22/2020   NSTEMI (non-ST elevated myocardial infarction) ([place]) 05/22/2020   Diabetic foot ulcer ([place]) 02/16/2020   Angina pectoris ([place]) 09/11/2017   Emphysema lung ([place]) 09/11/2017   Chronic diastolic heart failure ([place]) 11/06/2016   Mediastinitis 06/28/2016   Wound, surgical, infected 06/06/2016   S/P CABG (coronary artery bypass graft) 06/03/2016   Severe sepsis ([place]) 06/03/2016   GERD (gastroesophageal reflux disease) 05/08/2016   Morbid obesity ([place]) 05/08/2016   Type 2 diabetes mellitus with foot ulcer ([place]) 05/08/2016   Sinus tachycardia 05/08/2016   Acute chest pain 05/08/2016   Burping 05/08/2016   Tobacco use disorder 04/20/2016   Coronary artery disease involving native coronary artery of native heart with angina pectoris ([place]) 09/12/2015   Essential hypertension 09/12/2015   Hyperlipidemia 09/12/2015     Current Facility-Administered Medications:    0.9 %  sodium chloride infusion, 250 mL, Intravenous, PRN, Marty Heck, MD   acetaminophen (TYLENOL) tablet 650 mg, 650 mg, Oral, Q6H PRN, Marty Heck, MD, 650 mg at 10/11/21 0409   acetaminophen (TYLENOL) tablet  650 mg, 650 mg, Oral, Q4H PRN, Marty Heck, MD   amiodarone (PACERONE) tablet 200 mg, 200 mg, Oral, Daily, Marty Heck, MD, 200 mg at 10/11/21 0848   aspirin EC tablet 81 mg, 81 mg, Oral, Daily, Marty Heck, MD, 81 mg at 10/11/21 0848   clopidogrel (PLAVIX) tablet 75 mg, 75 mg, Oral, Daily, Marty Heck, MD, 75 mg at 10/11/21 0848   dapagliflozin propanediol (FARXIGA) tablet 10 mg, 10 mg, Oral, QAC breakfast, Marty Heck, MD   fenofibrate tablet 160 mg, 160 mg, Oral, Daily, Marty Heck, MD, 160 mg at 10/11/21 0848   furosemide (LASIX) tablet 20 mg, 20 mg, Oral, Daily, Marty Heck, MD, 20 mg at 10/11/21 0847   hydrALAZINE (APRESOLINE) injection 5 mg, 5 mg, Intravenous, Q20 Min PRN, Marty Heck, MD   influenza vac split quadrivalent PF (FLUARIX) injection 0.5 mL, 0.5 mL, Intramuscular, Tomorrow-1000, Tu, Ching T, DO   insulin aspart (novoLOG) injection 0-20 Units, 0-20 Units, Subcutaneous, Q4H, Marty Heck, MD, 3 Units at 10/12/21 0411   insulin glargine-yfgn (SEMGLEE) injection 15 Units, 15 Units, Subcutaneous, Daily, Marty Heck, MD, 15 Units at 10/11/21 1202   irbesartan (AVAPRO) tablet 150 mg, 150 mg, Oral, Daily, Marty Heck, MD, 150 mg at 10/11/21 0848   isosorbide mononitrate (IMDUR) 24 hr tablet 30 mg, 30 mg, Oral, Daily, Marty Heck, MD, 30 mg at 10/11/21 0848   labetalol (NORMODYNE) injection 10 mg, 10 mg, Intravenous, Q10 min PRN, Marty Heck, MD   metFORMIN (GLUCOPHAGE) tablet 500 mg, 500 mg, Oral, BID WC, Marty Heck, MD   metoprolol succinate (TOPROL-XL)  24 hr tablet 25 mg, 25 mg, Oral, TID, Marty Heck, MD, 25 mg at 10/11/21 2156   mupirocin ointment (BACTROBAN) 2 % 1 application, 1 application, Nasal, BID, Marty Heck, MD, 1 application at 66/29/47 2157   mupirocin ointment (BACTROBAN) 2 %, , Topical, Daily, Marty Heck, MD, Given at  10/11/21 0852   ondansetron (ZOFRAN) injection 4 mg, 4 mg, Intravenous, Q6H PRN, Marty Heck, MD   pantoprazole (PROTONIX) EC tablet 40 mg, 40 mg, Oral, Daily, Marty Heck, MD, 40 mg at 10/11/21 0848   [COMPLETED] piperacillin-tazobactam (ZOSYN) IVPB 3.375 g, 3.375 g, Intravenous, Once, Stopped at 10/09/21 2100 **FOLLOWED BY** piperacillin-tazobactam (ZOSYN) IVPB 3.375 g, 3.375 g, Intravenous, Q8H, Marty Heck, MD, Last Rate: 12.5 mL/hr at 10/12/21 0411, 3.375 g at 10/12/21 0411   rosuvastatin (CRESTOR) tablet 20 mg, 20 mg, Oral, QHS, Marty Heck, MD, 20 mg at 10/11/21 2157   sodium chloride flush (NS) 0.9 % injection 3 mL, 3 mL, Intravenous, Q12H, Marty Heck, MD, 3 mL at 10/11/21 2204   sodium chloride flush (NS) 0.9 % injection 3 mL, 3 mL, Intravenous, PRN, Marty Heck, MD  Allergies  Allergen Reactions   Chlorhexidine Gluconate Itching    Received CHG bath, began itching, required benadryl   Cat Hair Extract Other (See Comments)    Sneezing, watery eyes.   Tramadol Other (See Comments)    Hallucinations   Vancomycin     Other reaction(s): Anaphylaxis*   Codeine Rash     Objective: Today's Vitals   10/11/21 2158 10/11/21 2200 10/12/21 0000 10/12/21 0407  BP:  (!) 142/65 (!) 148/60 140/69  Pulse:  70 71 67  Resp: 18 20  18   Temp:   98 F (36.7 C) 98.2 F (36.8 C)  TempSrc:   Oral Oral  SpO2:  97% 96% 97%  Weight:    112.8 kg  Height:      PainSc:        General: No acute distress  Left Lower extremity: Dressing to left foot clean, dry, intact. No strikethrough noted, Upon removal of dressings loose staples intact with no dehiscence open amputation stump wound granular base with fatty tissue and bone wound edges continue to appear to be healthy and viable with no recurrence of necrosis, decreasing erythema, decreasing edema, mild blood drainage.  Capillary fill time intact to all remaining lesser digits, palpable DP and PT  pedal pulses, no other acute signs of infection. No calf pain. Range of motion excluding surgical site within normal limits with no pain or crepitation.        Results for orders placed or performed during the hospital encounter of 10/09/21  Resp Panel by RT-PCR (Flu A&B, Covid) Nasopharyngeal Swab     Status: None   Collection Time: 10/09/21  6:51 PM   Specimen: Nasopharyngeal Swab; Nasopharyngeal(NP) swabs in vial transport medium  Result Value Ref Range Status   SARS Coronavirus 2 by RT PCR NEGATIVE NEGATIVE Final    Comment: (NOTE) SARS-CoV-2 target nucleic acids are NOT DETECTED.  The SARS-CoV-2 RNA is generally detectable in upper respiratory specimens during the acute phase of infection. The lowest concentration of SARS-CoV-2 viral copies this assay can detect is 138 copies/mL. A negative result does not preclude SARS-Cov-2 infection and should not be used as the sole basis for treatment or other patient management decisions. A negative result may occur with  improper specimen collection/handling, submission of specimen other than nasopharyngeal swab, presence  of viral mutation(s) within the areas targeted by this assay, and inadequate number of viral copies(<138 copies/mL). A negative result must be combined with clinical observations, patient history, and epidemiological information. The expected result is Negative.  Fact Sheet for Patients:  EntrepreneurPulse.com.au  Fact Sheet for Healthcare Providers:  IncredibleEmployment.be  This test is no t yet approved or cleared by the [place] FDA and  has been authorized for detection and/or diagnosis of SARS-CoV-2 by FDA under an Emergency Use Authorization (EUA). This EUA will remain  in effect (meaning this test can be used) for the duration of the COVID-19 declaration under Section 564(b)(1) of the Act, 21 U.S.C.section 360bbb-3(b)(1), unless the authorization is terminated  or  revoked sooner.       Influenza A by PCR NEGATIVE NEGATIVE Final   Influenza B by PCR NEGATIVE NEGATIVE Final    Comment: (NOTE) The Xpert Xpress SARS-CoV-2/FLU/RSV plus assay is intended as an aid in the diagnosis of influenza from Nasopharyngeal swab specimens and should not be used as a sole basis for treatment. Nasal washings and aspirates are unacceptable for Xpert Xpress SARS-CoV-2/FLU/RSV testing.  Fact Sheet for Patients: EntrepreneurPulse.com.au  Fact Sheet for Healthcare Providers: IncredibleEmployment.be  This test is not yet approved or cleared by the [place] FDA and has been authorized for detection and/or diagnosis of SARS-CoV-2 by FDA under an Emergency Use Authorization (EUA). This EUA will remain in effect (meaning this test can be used) for the duration of the COVID-19 declaration under Section 564(b)(1) of the Act, 21 U.S.C. section 360bbb-3(b)(1), unless the authorization is terminated or revoked.  Performed at [facility], [place] [address] [place], [place] [address]   Blood culture (routine x 2)     Status: None (Preliminary result)   Collection Time: 10/09/21  7:31 PM   Specimen: BLOOD LEFT HAND  Result Value Ref Range Status   Specimen Description BLOOD LEFT HAND  Final   Special Requests   Final    BOTTLES DRAWN AEROBIC AND ANAEROBIC Blood Culture results may not be optimal due to an inadequate volume of blood received in culture bottles   Culture   Final    NO GROWTH 2 DAYS Performed at [facility], [place] [address] [place], [place] [address]    Report Status PENDING  Incomplete  Surgical PCR screen     Status: None   Collection Time: 10/10/21 12:04 PM   Specimen: Nasal Mucosa; Nasal Swab  Result Value Ref Range Status   MRSA, PCR NEGATIVE NEGATIVE Final   Staphylococcus aureus NEGATIVE NEGATIVE Final    Comment: (NOTE) The Xpert SA Assay (FDA approved for NASAL specimens in patients  51 years of age and older), is one component of a comprehensive surveillance program. It is not intended to diagnose infection nor to guide or monitor treatment. Performed at [facility], [place] [address], [place], [place] [address]   Aerobic/Anaerobic Culture w Gram Stain (surgical/deep wound)     Status: None (Preliminary result)   Collection Time: 10/10/21  7:28 PM   Specimen: Wound  Result Value Ref Range Status   Specimen Description WOUND LEFT FOOT  Final   Special Requests NONE  Final   Gram Stain   Final    FEW WBC PRESENT, PREDOMINANTLY MONONUCLEAR NO ORGANISMS SEEN    Culture   Final    NO GROWTH < 24 HOURS Performed at [facility], [place] [address] [place], [place] [address]  Report Status PENDING  Incomplete      Assessment and Plan:  Problem List Items Addressed This Visit       Other   * (Principal) Cellulitis, wound, post-operative   Other Visit Diagnoses     Postoperative infection, unspecified type, initial encounter    -  Primary   Relevant Medications   DAPTOmycin (CUBICIN) 350 mg in sodium chloride 0.9 % IVPB (Completed)   influenza vac split quadrivalent PF (FLUARIX) injection 0.5 mL   mupirocin ointment (BACTROBAN) 2 % 1 application   mupirocin ointment (BACTROBAN) 2 %   Cellulitis of left foot           -Patient seen and evaluated at bedside -Chart reviewed -Dressing change performed applied wet-to-dry packing to the wound bed covered with dry dressing to the left foot -Advised patient to make sure to keep dressing clean, dry, and intact to Left foot allowing nursing to reinforce as ordered if there is bloody strikethrough -Culture so far showing few white blood cells tailor antibiotics pending culture results; recommend to continue to keep patient inpatient until leukocytosis has improved so far downtrending -Partial weightbearing/weightbearing to heel with post op shoe for bedside and bathroom only with assistance; recommend PT to  assess patient prior to discharge for mobility needs -Continue with PRN meds, rest, and elevation to assist with pain and edema control  -Vascular recommendations reviewed -Recommend dietitian/diabetic educator to assist patient with postoperative meal regimen for wound healing as well as proper food choices to help with glycemic control/elevated A1c -Podiatry will continue to follow closely  -Anticipated discharge plan of care: To home in 1 day or so pending improvement with cellulitis and leukocytosis with antibiotics based on culture results and home nursing; will require home nursing to help with dressing changes every other day applying packing and dry dressing to the left foot as previously recommended.   Dr. Landis Martins, DPM  [facility] [phone] office [phone] cell Available via secure chat

## 2021-10-12 NOTE — Progress Notes (Signed)
PROGRESS NOTE   Jasmine Richardson  OJJ:[id number] DOB: 01-16-1961 DOA: 10/09/2021 PCP: Jasmine Johns, MD  Brief Narrative:  60 year old community dwelling female CABG 9371  complicated by neuritis status post myocutaneous flaps--CAD status post stent 06/22/2020--repeat hospitalization for chest pain 07/22/2021 with Showing no new coronary occlusion, underlying VT status post ICD placement [facility] 01/05/2021 CKD 3 HTN DM TY 2 Seen 9/30 Dr. Cannon Kettle for toe plantar ulceration and cellulitis- had left great toe amputation 10/19 ABI was 0.82 10/07/2019 and was discharged on Bactrim Reviewed on dressing changes and noted necrotic tissue and streaking redness to surgical site patient was admitted 10/24--CT foot showed a posterior soft tissue edema Dr. Cannon Kettle of podiatry saw the patient developed worsening symptoms and came back to the ED  Hospital-Problem based course  Cellulitis necrosis of amputation site with group B strep Status post left foot incision and I&D Await deep wound culture 10/25-no growth on Wound cult-PWB and PT to assess today,  Rpt CBC am On Tylenol only at this time for pain and is controlled--QOD dressing changes to LLE with packing and dry dressing Vascular surgery performed angiogram stenting and intervention 10/26  Significant CAD history as above Continue aspirin 81 daily, Plavix 75 50 daily Imdur 30 daily metoprolol 25 XL 3 times daily Crestor 20 nightly aspirin 81 Outpatient can resume Repatha VT status post PPM 2022 Continue amiodarone 200 daily keep on monitors HFpEF Lasix held at this time-resume in the outpatient setting DM TY 2 A1c 12.8 CBGs 130-170, eating 100% of meals Resume metformin 500 twice daily - Takes tresiba 45 units of insulin at home, Humalog 20 in the evening---Lantus 15 units -evening Resumed Farxiga    DVT prophylaxis: Lovenox Code Status: Presumed full Family Communication: None present Disposition:  Status is: Inpatient  Remains inpatient  appropriate because:   Need for further work-up    Consultants:  Podiatry Vascular surgery  Procedures:  Angiogram and stent 10/26 by Vascular  Antimicrobials: Currently Zosyn-->augmentin 10/27   Subjective:  Well no distress No pain diarr n/v cp  Objective: Vitals:   10/11/21 2158 10/11/21 2200 10/12/21 0000 10/12/21 0407  BP:  (!) 142/65 (!) 148/60 140/69  Pulse:  70 71 67  Resp: 18 20  18   Temp:   98 F (36.7 C) 98.2 F (36.8 C)  TempSrc:   Oral Oral  SpO2:  97% 96% 97%  Weight:    112.8 kg  Height:        Intake/Output Summary (Last 24 hours) at 10/12/2021 0713 Last data filed at 10/12/2021 0416 Gross per 24 hour  Intake 5.09 ml  Output 500 ml  Net -494.91 ml    Filed Weights   10/10/21 0026 10/12/21 0407  Weight: 113.4 kg 112.8 kg    Examination:   Awake coherent in nad no focal deficit Cta b no added sound S1 s 2no m/r/g Abd soft nt nd no rebound no guard Wound in re-in forced dressing Psych euthymic  Data Reviewed: personally reviewed   CBC    Component Value Date/Time   WBC 12.4 (H) 10/12/2021 0144   RBC 3.48 (L) 10/12/2021 0144   HGB 9.4 (L) 10/12/2021 0144   HGB 10.4 (L) 06/17/2020 0933   HCT 30.3 (L) 10/12/2021 0144   HCT 32.4 (L) 06/17/2020 0933   PLT 460 (H) 10/12/2021 0144   PLT 269 06/17/2020 0933   MCV 87.1 10/12/2021 0144   MCV 86 06/17/2020 0933   MCH 27.0 10/12/2021 0144   MCHC 31.0 10/12/2021  0144   RDW 13.4 10/12/2021 0144   RDW 14.2 06/17/2020 0933   LYMPHSABS 1.9 10/09/2021 1950   MONOABS 0.4 10/09/2021 1950   EOSABS 0.1 10/09/2021 1950   BASOSABS 0.0 10/09/2021 1950   CMP Latest Ref Rng & Units 10/12/2021 10/11/2021 10/10/2021  Glucose 70 - 99 mg/dL 142(H) 188(H) 143(H)  BUN 8 - 23 mg/dL 9 9 11   Creatinine 0.44 - 1.00 mg/dL 0.98 1.08(H) 0.93  Sodium 135 - 145 mmol/L 136 136 136  Potassium 3.5 - 5.1 mmol/L 4.0 4.5 3.5  Chloride 98 - 111 mmol/L 106 104 103  CO2 22 - 32 mmol/L 24 24 23   Calcium 8.9 - 10.3  mg/dL 9.0 9.1 9.0  Total Protein 6.5 - 8.1 g/dL - - -  Total Bilirubin 0.3 - 1.2 mg/dL - - -  Alkaline Phos 38 - 126 U/L - - -  AST 15 - 41 U/L - - -  ALT 0 - 44 U/L - - -     Radiology Studies: PERIPHERAL VASCULAR CATHETERIZATION  Result Date: 10/11/2021 Patient name: Jasmine Richardson            MRN: [id number]        DOB: 12-Mar-1961        Sex: female  10/11/2021 Pre-operative Diagnosis: Critical limb ischemia of the left lower extremity with tissue loss Post-operative diagnosis:  Same Surgeon:  Marty Heck, MD Procedure Performed: 1.  Ultrasound-guided access right common femoral artery 2.  Aortogram with catheter selection of aorta 3.  Left lower extremity arteriogram with selection of third order branches 4.  Left anterior tibial artery angioplasty (2.5 mm x 150 mm Sterling) 5.  55 minutes of monitored moderate conscious sedation time  Indications: Patient is a 60 year old female seen after she underwent I&D of a nonhealing left hallux amputation at [place] [facility].  She had monophasic runoff distally with no palpable pedal pulses.  We recommended lower extremity arteriogram with possible intervention after risk benefits discussed.  Findings:  Aortogram showed patent renal arteries bilaterally.  The infrarenal aorta and bilateral iliacs were widely patent with no flow limiting stenosis.  Left lower extremity runoff showed a patent common femoral and patent but diseased profunda.  SFA was widely patent.  Above and below-knee popliteal artery widely patent.  She does have tibial disease.  Dominant runoff is in the anterior tibial and peroneal.  The posterior tibial appears to occlude shortly after takeoff.  Anterior tibial had multiple high-grade stenosis >80% throughout the proximal to mid segment and did have in-line flow to a dorsalis pedis into the foot.  The peroneal was diseased and very small in the mid to distal calf.  The left anterior tibial was selected and the multi-focal high-grade  stenosis was crossed antegrade.  This was treated with a 2.5 mm x 150 mm Sterling to nominal pressure for 2 minutes.  Excellent results with no residual stenosis.  Preserved runoff.  I elected not to treat the peroneal given this was very small and I thought would be high risk of damaging the vessel.             Procedure:  The patient was identified in the holding area and taken to room 8.  The patient was then placed supine on the table and prepped and draped in the usual sterile fashion.  A time out was called.  Ultrasound was used to evaluate the right common femoral artery.  It was patent .  A digital ultrasound image was acquired.  A micropuncture needle was used to access the right common femoral artery under ultrasound guidance.  An 018 wire was advanced without resistance and a micropuncture sheath was placed.  The 018 wire was removed and a benson wire was placed.  The micropuncture sheath was exchanged for a 5 french sheath.  An omniflush catheter was advanced over the wire to the level of L-1.  An abdominal angiogram was obtained.  Next, using the omniflush catheter and a benson wire, the aortic bifurcation was crossed and the catheter was placed into theleft external iliac artery and left runoff was obtained.  After evaluating images elected to intervene on her tibial disease.  We used a Rosen wire down the left SFA and exchanged for a long 5 [place] Ansell sheath in the right groin over the aortic bifurcation.  Patient was given 100 units per kilogram IV heparin.  We then used the straight 100 cm flush catheter to get a wire down into the popliteal artery.  We exchanged for a V 18 with a 018 quick cross.  We were able to cannulate the anterior tibial antegrade and crossed all the high-grade stenosis and got a wire to the distal ankle.  We then elected to angioplasty all the stenosis in the proximal to mid anterior tibial artery with a 2.5 mm x 150 mm Sterling to nominal pressure for 2 minutes.   Excellent results with no residual stenosis.  Preserved runoff distally into the foot through the dorsalis pedis.  Wires and catheters were removed.  I did get a groin access shot.  I elected not to close.  Taken to holding to have manual pressure and sheath removal.   Plan: Patient will need aspirin Plavix statin.  She is optimized from a vascular standpoint.  Marty Heck, MD [facility] Office: [phone]   VAS [place] LOWER EXTREMITY ARTERIAL DUPLEX (7a-7p)  Result Date: 10/10/2021 LOWER EXTREMITY ARTERIAL DUPLEX STUDY Patient Name:  Jasmine Richardson  Date of Exam:   10/10/2021 Medical Rec #: [id number]      Accession #:    [id number] Date of Birth: August 19, 1961     Patient Gender: F Patient Age:   59 years Exam [facility]  [facility] Procedure:      VAS [place] LOWER EXTREMITY ARTERIAL DUPLEX Referring Phys: Vonna Kotyk ZAVITZ --------------------------------------------------------------------------------  Indications: Left transmetatarsal amputation and post op pain. High Risk Factors: Hypertension, hyperlipidemia, Diabetes, coronary artery                    disease.  Current ABI: n/a Comparison Study: no prior Performing Technologist: Archie Patten RVS  Examination Guidelines: A complete evaluation includes B-mode imaging, spectral Doppler, color Doppler, and power Doppler as needed of all accessible portions of each vessel. Bilateral testing is considered an integral part of a complete examination. Limited examinations for reoccurring indications may be performed as noted.   +-----------+--------+-----+--------+----------+--------------+ LEFT       PSV cm/sRatioStenosisWaveform  Comments       +-----------+--------+-----+--------+----------+--------------+ CFA Prox   104                  triphasic                +-----------+--------+-----+--------+----------+--------------+ DFA        172                  triphasic                 +-----------+--------+-----+--------+----------+--------------+  SFA Prox   138                  biphasic                 +-----------+--------+-----+--------+----------+--------------+ SFA Mid    113                  biphasic                 +-----------+--------+-----+--------+----------+--------------+ SFA Distal 69                   biphasic                 +-----------+--------+-----+--------+----------+--------------+ POP Distal 63                   biphasic                 +-----------+--------+-----+--------+----------+--------------+ TP Trunk   88                   biphasic                 +-----------+--------+-----+--------+----------+--------------+ ATA Distal 60                   monophasic               +-----------+--------+-----+--------+----------+--------------+ PTA Distal 34                   monophasic               +-----------+--------+-----+--------+----------+--------------+ PERO Distal                               not visualized +-----------+--------+-----+--------+----------+--------------+  Summary: Left: Distal anterior tibial artery and distal posterior tibial artery have decreased monophasic flow, otherwise no obvious evidence of stenosis noted in the left lower extremity.  See table(s) above for measurements and observations. Electronically signed by Harold Barban MD on 10/10/2021 at 9:17:16 PM.    Final      Scheduled Meds:  amiodarone  200 mg Oral Daily   aspirin EC  81 mg Oral Daily   clopidogrel  75 mg Oral Daily   dapagliflozin propanediol  10 mg Oral QAC breakfast   fenofibrate  160 mg Oral Daily   furosemide  20 mg Oral Daily   influenza vac split quadrivalent PF  0.5 mL Intramuscular Tomorrow-1000   insulin aspart  0-20 Units Subcutaneous Q4H   insulin glargine-yfgn  15 Units Subcutaneous Daily   irbesartan  150 mg Oral Daily   isosorbide mononitrate  30 mg Oral Daily   metFORMIN  500 mg Oral BID WC    metoprolol succinate  25 mg Oral TID   mupirocin ointment  1 application Nasal BID   mupirocin ointment   Topical Daily   pantoprazole  40 mg Oral Daily   rosuvastatin  20 mg Oral QHS   sodium chloride flush  3 mL Intravenous Q12H   Continuous Infusions:  sodium chloride     piperacillin-tazobactam (ZOSYN)  IV 3.375 g (10/12/21 0411)     LOS: 3 days   Time spent:   Nita Sells, MD [facility] To contact the attending provider between 7A-7P or the covering provider during after hours 7P-7A, please log into the web site [email or web address] and access using universal [place] password for that web site. If you do not  have the password, please call the hospital operator.  10/12/2021, 7:13 AM

## 2021-10-12 NOTE — Discharge Instructions (Signed)
° °  [facility] ° °Discharge Instructions ° °Lower Extremity Angiogram; Angioplasty/Stenting ° °Please refer to the following instructions for your post-procedure care. Your surgeon or physician assistant will discuss any changes with you. ° °Activity ° °Avoid lifting more than 8 pounds (1 gallons of milk) for 72 hours (3 days) after your procedure. You may walk as much as you can tolerate. It's OK to drive after 72 hours. ° °Bathing/Showering ° °You may shower the day after your procedure. If you have a bandage, you may remove it at 24- 48 hours. Clean your incision site with mild soap and water. Pat the area dry with a clean towel. ° °Diet ° °Resume your pre-procedure diet. There are no special food restrictions following this procedure. All patients with peripheral vascular disease should follow a low fat/low cholesterol diet. In order to heal from your surgery, it is CRITICAL to get adequate nutrition. Your body requires vitamins, minerals, and protein. Vegetables are the best source of vitamins and minerals. Vegetables also provide the perfect balance of protein. Processed food has little nutritional value, so try to avoid this. ° °Medications ° °Resume taking all of your medications unless your doctor tells you not to. If your incision is causing pain, you may take over-the-counter pain relievers such as acetaminophen (Tylenol) ° °Follow Up ° °Follow up will be arranged at the time of your procedure. You may have an office visit scheduled or may be scheduled for surgery. Ask your surgeon if you have any questions. ° °Please call us immediately for any of the following conditions: °•Severe or worsening pain your legs or feet at rest or with walking. °•Increased pain, redness, drainage at your groin puncture site. °•Fever of 101 degrees or higher. °•If you have any mild or slow bleeding from your puncture site: lie down, apply firm constant pressure over the area with a piece of  gauze or a clean wash cloth for 30 minutes- no peeking!, call 911 right away if you are still bleeding after 30 minutes, or if the bleeding is heavy and unmanageable. ° °Reduce your risk factors of vascular disease: ° °Stop smoking. If you would like help call [facility] at 1-800-QUIT-NOW (1-800-784-8669) or [place] at 336-586-4000. °Manage your cholesterol °Maintain a desired weight °Control your diabetes °Keep your blood pressure down ° °If you have any questions, please call the office at [phone] ° °

## 2021-10-12 NOTE — Progress Notes (Signed)
Mobility Specialist Progress Note    10/12/21 1705  Mobility  Activity Ambulated in hall  Level of Assistance Modified independent, requires aide device or extra time  Assistive Device Front wheel walker  Distance Ambulated (ft) 270 ft  Mobility Ambulated with assistance in hallway  Mobility Response Tolerated well  Mobility performed by Mobility specialist  $Mobility charge 1 Mobility   Pt received in bed and agreeable. No complaints on walk. Interested in taking a rollator home. Returned to bed with call bell in reach.   Hildred Alamin Mobility Specialist  Mobility Specialist Phone: [phone]

## 2021-10-12 NOTE — Progress Notes (Signed)
Patient suffers from L 1st transmetatarsal amputation which impairs their ability to perform daily activities like ambulating in the home (due to restrictions from Podiatry in an effort to aide in wound healing).  A walker alone will not resolve the issues with performing activities of daily living. A wheelchair will allow patient to safely perform daily activities.  The patient can self propel in the home or has a caregiver who can provide assistance.      Zenaida Niece, PT, DPT Acute Rehabilitation Pager: [phone] Office [phone]

## 2021-10-12 NOTE — Evaluation (Signed)
Physical Therapy Evaluation Patient Details Name: Jasmine Richardson MRN: [id number] DOB: [birth year] Today's Date: 10/12/2021  History of Present Illness  60 y.o. female presents to [facility] hospital on 10/09/2021 with worsening of wound after recent transmetatarsal amputation on 10/04/2021. Pt underwent L foot I&D on 10/25, followed by LLE aortogram and angioplasty on 10/11/2021. PMH includes chronic diastolic heart failure, history of VT s/p ICD, CAD S/P CABG, hypertension, emphysema, and DMII.  Clinical Impression  Pt presents to PT s/p I&D of L 1st transmetatarsal amputation. Pt demonstrates the ability to transfer and ambulate very short distances, maintaining heel WB while in post-op shoe. Pt expresses no concerns over mobility in the home setting and has access to a walker and bedside commode. Pt will benefit from receiving a manual wheelchair at the time of discharge as this will allow for mobility of household and community distances while L foot wound continues to heal.       Recommendations for follow up therapy are one component of a multi-disciplinary discharge planning process, led by the attending physician.  Recommendations may be updated based on patient status, additional functional criteria and insurance authorization.  Follow Up Recommendations No PT follow up (pt may benefit from outpatient PT for gait and balance once cleared to ambulate further)    Assistance Recommended at Discharge Intermittent Supervision/Assistance  Functional Status Assessment Patient has had a recent decline in their functional status and demonstrates the ability to make significant improvements in function in a reasonable and predictable amount of time.  Equipment Recommendations  Wheelchair (measurements PT)    Recommendations for Other Services       Precautions / Restrictions Precautions Precautions: Fall Precaution Comments: Limit mobility to bedside and bathroom only with assistance Required Braces  or Orthoses: Other Brace Other Brace: post-op shoe Restrictions Weight Bearing Restrictions: Yes LLE Weight Bearing: Partial weight bearing LLE Partial Weight Bearing Percentage or Pounds: 100%, WB through heel only in post op-shoe per Dr. Cannon Kettle Other Position/Activity Restrictions: Limit mobility to bedside and bathroom only with assistance      Mobility  Bed Mobility               General bed mobility comments: pt received and left in recliner, bed mobility not assessed    Transfers Overall transfer level: Modified independent Equipment used: Rolling walker (2 wheels)               General transfer comment: pt is able to perform sit to stand and stand pivot transfer with use of walker without assistance    Ambulation/Gait Ambulation/Gait assistance: Supervision Gait Distance (Feet): 8 Feet Assistive device: Rolling walker (2 wheels) Gait Pattern/deviations: Step-to pattern;Decreased stance time - left Gait velocity: reduced Gait velocity interpretation: <1.8 ft/sec, indicate of risk for recurrent falls General Gait Details: pt with short step-to gait, reduced stance time on LLE. Pt maintaining weight through heel without foot rolling forward during gait cycle  Stairs            Wheelchair Mobility    Modified Rankin (Stroke Patients Only)       Balance Overall balance assessment: Needs assistance Sitting-balance support: No upper extremity supported;Feet supported Sitting balance-Leahy Scale: Good     Standing balance support: Bilateral upper extremity supported;Single extremity supported Standing balance-Leahy Scale: Poor Standing balance comment: benefits from UE support of walker to maintain heel WB.  Pertinent Vitals/Pain Pain Assessment: No/denies pain    Home Living Family/patient expects to be discharged to:: Private residence Living Arrangements: Spouse/significant other;Children Available  Help at Discharge: Family;Available 24 hours/day Type of Home: House Home Access: Level entry       Home Layout: One level Home Equipment: Conservation officer, nature (2 wheels);Cane - single point;BSC;Shower seat      Prior Function Prior Level of Function : Independent/Modified Independent             Mobility Comments: pt ambulates without device in the home. Pt utilizes cane for outdoor mobility       Hand Dominance        Extremity/Trunk Assessment   Upper Extremity Assessment Upper Extremity Assessment: Overall WFL for tasks assessed    Lower Extremity Assessment Lower Extremity Assessment: RLE deficits/detail;LLE deficits/detail RLE Deficits / Details: ROM WFL, observed strength WFL RLE Sensation: decreased light touch LLE Deficits / Details: ROM WFL, observed strength WFL LLE Sensation: decreased light touch    Cervical / Trunk Assessment Cervical / Trunk Assessment: Normal  Communication   Communication: No difficulties  Cognition Arousal/Alertness: Awake/alert Behavior During Therapy: WFL for tasks assessed/performed Overall Cognitive Status: Within Functional Limits for tasks assessed                                          General Comments General comments (skin integrity, edema, etc.): VSS on RA    Exercises     Assessment/Plan    PT Assessment Patient needs continued PT services  PT Problem List Decreased activity tolerance;Decreased balance;Decreased mobility;Impaired sensation;Decreased knowledge of use of DME       PT Treatment Interventions DME instruction;Functional mobility training;Therapeutic activities;Gait training;Balance training;Patient/family education;Wheelchair mobility training    PT Goals (Current goals can be found in the Care Plan section)  Acute Rehab PT Goals Patient Stated Goal: to go home PT Goal Formulation: With patient Time For Goal Achievement: 10/26/21 Potential to Achieve Goals: Good Additional  Goals Additional Goal #1: Pt will mobilize in manual wheelchair for >100' at a modI level to allow for mobility around the home while maintaining mobility restrictions s/p I&D of amputation    Frequency Min 3X/week   Barriers to discharge        Co-evaluation               AM-PAC PT "6 Clicks" Mobility  Outcome Measure Help needed turning from your back to your side while in a flat bed without using bedrails?: None Help needed moving from lying on your back to sitting on the side of a flat bed without using bedrails?: None Help needed moving to and from a bed to a chair (including a wheelchair)?: None Help needed standing up from a chair using your arms (e.g., wheelchair or bedside chair)?: None Help needed to walk in hospital room?: A Little Help needed climbing 3-5 steps with a railing? : A Little 6 Click Score: 22    End of Session   Activity Tolerance: Patient tolerated treatment well Patient left: in chair;with call bell/phone within reach Nurse Communication: Mobility status PT Visit Diagnosis: Other abnormalities of gait and mobility (R26.89)    Time: 9518-8416 PT Time Calculation (min) (ACUTE ONLY): 11 min   Charges:   PT Evaluation $PT Eval Low Complexity: 1 Low          Zenaida Niece, PT, DPT Acute  Rehabilitation Pager: [phone] Office [place] 10/12/2021, 1:46 PM

## 2021-10-12 NOTE — Progress Notes (Addendum)
Progress Note    10/12/2021 7:34 AM 1 Day Post-Op  Subjective:   Critical limb ischemia of the left lower extremity with tissue loss s/p arteriogram with left AT angioplasty. Attendant RN at bedside.   Vitals:   10/12/21 0000 10/12/21 0407  BP: (!) 148/60 140/69  Pulse: 71 67  Resp:  18  Temp: 98 F (36.7 C) 98.2 F (36.8 C)  SpO2: 96% 97%    Physical Exam: General appearance: Awake, alert in no apparent distress Cardiac: Heart rate and rhythm are regular Respirations: Nonlabored Right groin,soft without bleeding or hematoma Extremities: Left foot dressing was just changed by nursing staff. +DP pulse reported.   CBC    Component Value Date/Time   WBC 12.4 (H) 10/12/2021 0144   RBC 3.48 (L) 10/12/2021 0144   HGB 9.4 (L) 10/12/2021 0144   HGB 10.4 (L) 06/17/2020 0933   HCT 30.3 (L) 10/12/2021 0144   HCT 32.4 (L) 06/17/2020 0933   PLT 460 (H) 10/12/2021 0144   PLT 269 06/17/2020 0933   MCV 87.1 10/12/2021 0144   MCV 86 06/17/2020 0933   MCH 27.0 10/12/2021 0144   MCHC 31.0 10/12/2021 0144   RDW 13.4 10/12/2021 0144   RDW 14.2 06/17/2020 0933   LYMPHSABS 1.9 10/09/2021 1950   MONOABS 0.4 10/09/2021 1950   EOSABS 0.1 10/09/2021 1950   BASOSABS 0.0 10/09/2021 1950    BMET    Component Value Date/Time   NA 136 10/12/2021 0144   NA 132 (L) 02/14/2021 1042   K 4.0 10/12/2021 0144   CL 106 10/12/2021 0144   CO2 24 10/12/2021 0144   GLUCOSE 142 (H) 10/12/2021 0144   BUN 9 10/12/2021 0144   BUN 24 02/14/2021 1042   CREATININE 0.98 10/12/2021 0144   CALCIUM 9.0 10/12/2021 0144   GFRNONAA >60 10/12/2021 0144   GFRAA 67 01/12/2021 1013     Intake/Output Summary (Last 24 hours) at 10/12/2021 0734 Last data filed at 10/12/2021 0416 Gross per 24 hour  Intake 5.09 ml  Output 500 ml  Net -494.91 ml    HOSPITAL MEDICATIONS Scheduled Meds:  amiodarone  200 mg Oral Daily   aspirin EC  81 mg Oral Daily   clopidogrel  75 mg Oral Daily   dapagliflozin  propanediol  10 mg Oral QAC breakfast   fenofibrate  160 mg Oral Daily   furosemide  20 mg Oral Daily   influenza vac split quadrivalent PF  0.5 mL Intramuscular Tomorrow-1000   insulin aspart  0-20 Units Subcutaneous Q4H   insulin glargine-yfgn  15 Units Subcutaneous Daily   irbesartan  150 mg Oral Daily   isosorbide mononitrate  30 mg Oral Daily   metFORMIN  500 mg Oral BID WC   metoprolol succinate  25 mg Oral TID   mupirocin ointment  1 application Nasal BID   mupirocin ointment   Topical Daily   pantoprazole  40 mg Oral Daily   rosuvastatin  20 mg Oral QHS   sodium chloride flush  3 mL Intravenous Q12H   Continuous Infusions:  sodium chloride     piperacillin-tazobactam (ZOSYN)  IV 3.375 g (10/12/21 0411)   PRN Meds:.sodium chloride, acetaminophen, acetaminophen, hydrALAZINE, labetalol, ondansetron (ZOFRAN) IV, sodium chloride flush  Assessment and Plan: Critical limb ischemia of the left lower extremity with tissue loss s/p arteriogram with left AT angioplasty. Continue statin, asa, and Plavix. Follow-up in the office in one month with non-invasive studies   Risa Grill, PA-C [facility]  [phone] 10/12/2021  7:34 AM   I have seen and evaluated the patient. I agree with the PA note as documented above.  Postop day 1 status post left anterior tibial angioplasty for foot wound.  She is now optimized from a vascular surgery standpoint.  Right groin transfemoral access site looks good.  She has good Doppler signal in the left foot.  Aspirin Plavix statin at discharge.  We will arrange follow-up in 1 month with noninvasive imaging in our office as discussed with the patient.  Call vascular with questions or concerns.  Marty Heck, MD [facility] [place] Office: [phone]

## 2021-10-12 NOTE — Progress Notes (Signed)
Admitted from PACU accompanied by RN to [address].Pt is alert and oriented,V/S checked,attached to cardiac monitoring,CCMD notified,pulses checked.Will continue to monitor the patient.

## 2021-10-12 NOTE — Progress Notes (Signed)
Pharmacy Antibiotic Note  Jasmine Richardson is a 60 y.o. female admitted on 10/09/2021 with left foot cellulitis with necrosis of amputation stump.  Pharmacy has been consulted for zosyn dosing.  Patient presenting with post-op problem following left great toe amputation at [facility] on 10/23. Patient advised to be seen in ED for concerning discoloration indicating possible continued infection and vascular compromise.  She is now s/p I &D on 10/25 and arteriogram with angioplasty on 10/26.  -WBC= 12.4, afebrile -cultures: ngtd  Plan: -Continue zosyn 3.375gm IV q8h -Will follow renal function, cultures and clinical progress    Height: 5\' 7"  (170.2 cm) Weight: 112.8 kg (248 lb 10.9 oz) IBW/kg (Calculated) : 61.6  Temp (24hrs), Avg:98.2 F (36.8 C), Min:98 F (36.7 C), Max:98.4 F (36.9 C)  Recent Labs  Lab 10/09/21 1511 10/09/21 1950 10/10/21 0400 10/11/21 0343 10/12/21 0144  WBC  --  14.7* 13.4* 13.2* 12.4*  CREATININE  --  0.90 0.93 1.08* 0.98  LATICACIDVEN 1.3  --   --   --   --      Estimated Creatinine Clearance: 78.1 mL/min (by C-G formula based on SCr of 0.98 mg/dL).    Allergies  Allergen Reactions   Chlorhexidine Gluconate Itching    Received CHG bath, began itching, required benadryl   Cat Hair Extract Other (See Comments)    Sneezing, watery eyes.   Tramadol Other (See Comments)    Hallucinations   Vancomycin     Other reaction(s): Anaphylaxis*   Codeine Rash   Antimicrobials this admission: zosyn 10/24 >>    Microbiology results: 10/25 foot wound- ngtd 10/24 blood x2- ngtd  Thank you for allowing pharmacy to be a part of this patient's care.  Hildred Laser, PharmD Clinical Pharmacist **Pharmacist phone directory can now be found on [email or web address] ([email or web address]).  Listed under [place].

## 2021-10-13 ENCOUNTER — Encounter: Payer: Managed Care, Other (non HMO) | Admitting: Sports Medicine

## 2021-10-13 LAB — GLUCOSE, CAPILLARY
Glucose-Capillary: 198 mg/dL — ABNORMAL HIGH (ref 70–99)
Glucose-Capillary: 206 mg/dL — ABNORMAL HIGH (ref 70–99)
Glucose-Capillary: 169 mg/dL — ABNORMAL HIGH (ref 70–99)
Glucose-Capillary: 203 mg/dL — ABNORMAL HIGH (ref 70–99)

## 2021-10-13 LAB — CBC
HCT: 29.6 % — ABNORMAL LOW (ref 36.0–46.0)
Hemoglobin: 9.1 g/dL — ABNORMAL LOW (ref 12.0–15.0)
MCH: 26.8 pg (ref 26.0–34.0)
MCHC: 30.7 g/dL (ref 30.0–36.0)
MCV: 87.3 fL (ref 80.0–100.0)
Platelets: 413 10*3/uL — ABNORMAL HIGH (ref 150–400)
RBC: 3.39 MIL/uL — ABNORMAL LOW (ref 3.87–5.11)
RDW: 13.5 % (ref 11.5–15.5)
WBC: 11.8 10*3/uL — ABNORMAL HIGH (ref 4.0–10.5)
nRBC: 0 % (ref 0.0–0.2)

## 2021-10-13 LAB — BASIC METABOLIC PANEL
Anion gap: 5 (ref 5–15)
BUN: 10 mg/dL (ref 8–23)
CO2: 26 mmol/L (ref 22–32)
Calcium: 9 mg/dL (ref 8.9–10.3)
Chloride: 104 mmol/L (ref 98–111)
Creatinine, Ser: 1.14 mg/dL — ABNORMAL HIGH (ref 0.44–1.00)
GFR, Estimated: 55 mL/min — ABNORMAL LOW (ref >60)
Glucose, Bld: 170 mg/dL — ABNORMAL HIGH (ref 70–99)
Potassium: 3.9 mmol/L (ref 3.5–5.1)
Sodium: 135 mmol/L (ref 135–145)

## 2021-10-13 MED ORDER — INSULIN GLARGINE-YFGN 100 UNIT/ML ~~LOC~~ SOLN
20.0000 [IU] | Freq: Every day | SUBCUTANEOUS | Status: DC
  Administered 2021-10-14: 20 [IU] via SUBCUTANEOUS
  Filled 2021-10-13: qty 0.2

## 2021-10-13 NOTE — Progress Notes (Signed)
Podiatry Progress Note  Subjective: Jasmine Richardson is a 60 y.o. female patient seen at bedside, resting comfortably in no acute distress s/p day #3 left incision and drainage with removal of infected soft tissue/necrotic tissue at amputation stump site and wound culture.  Patient is also status post vascular intervention, 10/11/2021.  Patient reports that she is feeling okay had a little burning pain to the surgical site and took 2 Tylenol last night.  No other issues noted.   Patient Active Problem List   Diagnosis Date Noted   Cellulitis, wound, post-operative 10/09/2021   Abnormal mammogram of left breast 09/04/2021   Breast wound, right, subsequent encounter 09/04/2021   Demand ischemia ([place])    ICD (implantable cardioverter-defibrillator) in place 05/16/2021   Ventricular tachycardia 12/17/2020   Unstable angina ([place]) 05/22/2020   NSTEMI (non-ST elevated myocardial infarction) ([place]) 05/22/2020   Diabetic foot ulcer ([place]) 02/16/2020   Angina pectoris ([place]) 09/11/2017   Emphysema lung ([place]) 09/11/2017   Chronic diastolic heart failure ([place]) 11/06/2016   Mediastinitis 06/28/2016   Wound, surgical, infected 06/06/2016   S/P CABG (coronary artery bypass graft) 06/03/2016   Severe sepsis ([place]) 06/03/2016   GERD (gastroesophageal reflux disease) 05/08/2016   Morbid obesity ([place]) 05/08/2016   Type 2 diabetes mellitus with foot ulcer ([place]) 05/08/2016   Sinus tachycardia 05/08/2016   Acute chest pain 05/08/2016   Burping 05/08/2016   Tobacco use disorder 04/20/2016   Coronary artery disease involving native coronary artery of native heart with angina pectoris ([place]) 09/12/2015   Essential hypertension 09/12/2015   Hyperlipidemia 09/12/2015     Current Facility-Administered Medications:    0.9 %  sodium chloride infusion, 250 mL, Intravenous, PRN, Marty Heck, MD   acetaminophen (TYLENOL) tablet 650 mg, 650 mg, Oral, Q6H PRN, Marty Heck, MD, 650 mg at 10/11/21 0409    acetaminophen (TYLENOL) tablet 650 mg, 650 mg, Oral, Q4H PRN, Marty Heck, MD, 650 mg at 10/12/21 2350   amiodarone (PACERONE) tablet 200 mg, 200 mg, Oral, Daily, Marty Heck, MD, 200 mg at 10/12/21 0846   amoxicillin-clavulanate (AUGMENTIN) 875-125 MG per tablet 1 tablet, 1 tablet, Oral, Q12H, Samtani, Jai-Gurmukh, MD, 1 tablet at 10/12/21 2142   aspirin EC tablet 81 mg, 81 mg, Oral, Daily, Marty Heck, MD, 81 mg at 10/12/21 0846   clopidogrel (PLAVIX) tablet 75 mg, 75 mg, Oral, Daily, Marty Heck, MD, 75 mg at 10/12/21 0846   dapagliflozin propanediol (FARXIGA) tablet 10 mg, 10 mg, Oral, QAC breakfast, Marty Heck, MD, 10 mg at 10/12/21 1053   fenofibrate tablet 160 mg, 160 mg, Oral, Daily, Marty Heck, MD, 160 mg at 10/12/21 0845   furosemide (LASIX) tablet 20 mg, 20 mg, Oral, Daily, Marty Heck, MD, 20 mg at 10/12/21 0845   hydrALAZINE (APRESOLINE) injection 5 mg, 5 mg, Intravenous, Q20 Min PRN, Marty Heck, MD   influenza vac split quadrivalent PF (FLUARIX) injection 0.5 mL, 0.5 mL, Intramuscular, Tomorrow-1000, Tu, Ching T, DO   insulin aspart (novoLOG) injection 0-20 Units, 0-20 Units, Subcutaneous, Q4H, Marty Heck, MD, 4 Units at 10/13/21 0432   insulin glargine-yfgn (SEMGLEE) injection 15 Units, 15 Units, Subcutaneous, Daily, Marty Heck, MD, 15 Units at 10/12/21 0846   irbesartan (AVAPRO) tablet 150 mg, 150 mg, Oral, Daily, Marty Heck, MD, 150 mg at 10/12/21 0845   isosorbide mononitrate (IMDUR) 24 hr tablet 30 mg, 30 mg, Oral, Daily, Marty Heck, MD, 30 mg at 10/12/21  0845   labetalol (NORMODYNE) injection 10 mg, 10 mg, Intravenous, Q10 min PRN, Marty Heck, MD   metFORMIN (GLUCOPHAGE) tablet 500 mg, 500 mg, Oral, BID WC, Marty Heck, MD, 500 mg at 10/12/21 1519   metoprolol succinate (TOPROL-XL) 24 hr tablet 25 mg, 25 mg, Oral, TID, Marty Heck, MD, 25  mg at 10/12/21 2142   mupirocin ointment (BACTROBAN) 2 % 1 application, 1 application, Nasal, BID, Marty Heck, MD, 1 application at 44/81/85 2142   mupirocin ointment (BACTROBAN) 2 %, , Topical, Daily, Marty Heck, MD, Given at 10/11/21 0852   ondansetron (ZOFRAN) injection 4 mg, 4 mg, Intravenous, Q6H PRN, Marty Heck, MD   pantoprazole (PROTONIX) EC tablet 40 mg, 40 mg, Oral, Daily, Marty Heck, MD, 40 mg at 10/12/21 0845   rosuvastatin (CRESTOR) tablet 20 mg, 20 mg, Oral, QHS, Marty Heck, MD, 20 mg at 10/12/21 2142   sodium chloride flush (NS) 0.9 % injection 3 mL, 3 mL, Intravenous, Q12H, Marty Heck, MD, 3 mL at 10/12/21 2142   sodium chloride flush (NS) 0.9 % injection 3 mL, 3 mL, Intravenous, PRN, Marty Heck, MD  Allergies  Allergen Reactions   Chlorhexidine Gluconate Itching    Received CHG bath, began itching, required benadryl   Cat Hair Extract Other (See Comments)    Sneezing, watery eyes.   Tramadol Other (See Comments)    Hallucinations   Vancomycin     Other reaction(s): Anaphylaxis*   Codeine Rash     Objective: Today's Vitals   10/12/21 2350 10/13/21 0035 10/13/21 0400 10/13/21 0425  BP:    132/71  Pulse:    65  Resp:   20 19  Temp:    98.3 F (36.8 C)  TempSrc:    Oral  SpO2:    90%  Weight:      Height:      PainSc: 6  Asleep      General: No acute distress  Left Lower extremity: Dressing to left foot clean, dry, intact. No strikethrough noted, Upon removal of dressings loose staples intact with no dehiscence open amputation stump wound granular base with fatty tissue and exposed bone that appears to be healthy and viable, wound edges continue to appear to be healthy and viable with no recurrence of necrosis, slowly decreasing erythema, decreasing edema, minimal bloody drainage.  Capillary fill time intact to all remaining lesser digits, palpable DP and PT pedal pulses, no other acute signs of  infection. No calf pain. Range of motion excluding surgical site within normal limits with no pain or crepitation.         Results for orders placed or performed during the hospital encounter of 10/09/21  Resp Panel by RT-PCR (Flu A&B, Covid) Nasopharyngeal Swab     Status: None   Collection Time: 10/09/21  6:51 PM   Specimen: Nasopharyngeal Swab; Nasopharyngeal(NP) swabs in vial transport medium  Result Value Ref Range Status   SARS Coronavirus 2 by RT PCR NEGATIVE NEGATIVE Final    Comment: (NOTE) SARS-CoV-2 target nucleic acids are NOT DETECTED.  The SARS-CoV-2 RNA is generally detectable in upper respiratory specimens during the acute phase of infection. The lowest concentration of SARS-CoV-2 viral copies this assay can detect is 138 copies/mL. A negative result does not preclude SARS-Cov-2 infection and should not be used as the sole basis for treatment or other patient management decisions. A negative result may occur with  improper specimen collection/handling, submission of  specimen other than nasopharyngeal swab, presence of viral mutation(s) within the areas targeted by this assay, and inadequate number of viral copies(<138 copies/mL). A negative result must be combined with clinical observations, patient history, and epidemiological information. The expected result is Negative.  Fact Sheet for Patients:  EntrepreneurPulse.com.au  Fact Sheet for Healthcare Providers:  IncredibleEmployment.be  This test is no t yet approved or cleared by the [place] FDA and  has been authorized for detection and/or diagnosis of SARS-CoV-2 by FDA under an Emergency Use Authorization (EUA). This EUA will remain  in effect (meaning this test can be used) for the duration of the COVID-19 declaration under Section 564(b)(1) of the Act, 21 U.S.C.section 360bbb-3(b)(1), unless the authorization is terminated  or revoked sooner.       Influenza A  by PCR NEGATIVE NEGATIVE Final   Influenza B by PCR NEGATIVE NEGATIVE Final    Comment: (NOTE) The Xpert Xpress SARS-CoV-2/FLU/RSV plus assay is intended as an aid in the diagnosis of influenza from Nasopharyngeal swab specimens and should not be used as a sole basis for treatment. Nasal washings and aspirates are unacceptable for Xpert Xpress SARS-CoV-2/FLU/RSV testing.  Fact Sheet for Patients: EntrepreneurPulse.com.au  Fact Sheet for Healthcare Providers: IncredibleEmployment.be  This test is not yet approved or cleared by the [place] FDA and has been authorized for detection and/or diagnosis of SARS-CoV-2 by FDA under an Emergency Use Authorization (EUA). This EUA will remain in effect (meaning this test can be used) for the duration of the COVID-19 declaration under Section 564(b)(1) of the Act, 21 U.S.C. section 360bbb-3(b)(1), unless the authorization is terminated or revoked.  Performed at [facility], [place] [address], [place], [place] [address]   Blood culture (routine x 2)     Status: None (Preliminary result)   Collection Time: 10/09/21  7:31 PM   Specimen: BLOOD LEFT HAND  Result Value Ref Range Status   Specimen Description BLOOD LEFT HAND  Final   Special Requests   Final    BOTTLES DRAWN AEROBIC AND ANAEROBIC Blood Culture results may not be optimal due to an inadequate volume of blood received in culture bottles   Culture   Final    NO GROWTH 3 DAYS Performed at [facility], [place] [address], [place], [place] [address]    Report Status PENDING  Incomplete  Surgical PCR screen     Status: None   Collection Time: 10/10/21 12:04 PM   Specimen: Nasal Mucosa; Nasal Swab  Result Value Ref Range Status   MRSA, PCR NEGATIVE NEGATIVE Final   Staphylococcus aureus NEGATIVE NEGATIVE Final    Comment: (NOTE) The Xpert SA Assay (FDA approved for NASAL specimens in patients 43 years of age and older), is one component  of a comprehensive surveillance program. It is not intended to diagnose infection nor to guide or monitor treatment. Performed at [facility], [place] [address] [place], [place] [address]   Aerobic/Anaerobic Culture w Gram Stain (surgical/deep wound)     Status: None (Preliminary result)   Collection Time: 10/10/21  7:28 PM   Specimen: Wound  Result Value Ref Range Status   Specimen Description WOUND LEFT FOOT  Final   Special Requests NONE  Final   Gram Stain   Final    FEW WBC PRESENT, PREDOMINANTLY MONONUCLEAR NO ORGANISMS SEEN    Culture   Final    HOLDING FOR POSSIBLE ANAEROBE Performed at [facility], [address],  [place] [address]    Report Status PENDING  Incomplete      Assessment and Plan:  Problem List Items Addressed This Visit       Other   * (Principal) Cellulitis, wound, post-operative   Other Visit Diagnoses     Postoperative infection, unspecified type, initial encounter    -  Primary   Relevant Medications   DAPTOmycin (CUBICIN) 350 mg in sodium chloride 0.9 % IVPB (Completed)   influenza vac split quadrivalent PF (FLUARIX) injection 0.5 mL   mupirocin ointment (BACTROBAN) 2 % 1 application   mupirocin ointment (BACTROBAN) 2 %   Cellulitis of left foot           -Patient seen and evaluated at bedside -Chart reviewed -Dressing change performed applied wet-to-dry packing to the wound bed covered with dry dressing to the left foot -Advised patient to make sure to keep dressing clean, dry, and intact to Left foot allowing nursing to reinforce as ordered if there is bloody strikethrough -Culture so far showing few white blood cells tailor antibiotics pending culture results; recommend to continue to keep patient inpatient until leukocytosis has improved so far downtrending hopefully if continues to improve patient may go home over the weekend -Partial weightbearing/weightbearing to heel with post op shoe for bedside and bathroom  only with assistance; PT recs reviewed -Continue with PRN meds, rest, and elevation to assist with pain and edema control  -Vascular recommendations reviewed -Podiatry will continue to follow closely  -Anticipated discharge plan of care: To home over the weekend pending improvement with cellulitis and leukocytosis with antibiotics based on culture results and home nursing; will require home nursing to help with dressing changes every other day applying packing and dry dressing to the left foot as previously suggested.   Dr. Landis Martins, DPM  [facility] [phone] office [phone] cell Available via secure chat

## 2021-10-13 NOTE — Progress Notes (Signed)
Pt LW PIV noted with catheter pulled out under dressing.  Removed.  MD aware aware and ok to leave out as anticipate DC in AM.

## 2021-10-13 NOTE — Progress Notes (Signed)
Mobility Specialist Progress Note    10/13/21 1223  Mobility  Activity Ambulated in hall  Level of Assistance Modified independent, requires aide device or extra time  Assistive Device Front wheel walker  Distance Ambulated (ft) 210 ft  Mobility Ambulated with assistance in hallway  Mobility Response Tolerated well  Mobility performed by Mobility specialist  Bed Position Chair  $Mobility charge 1 Mobility   Pt received sitting EOB and agreeable. No complaints on walk. Pt discussed feeling sad about not being able to go home today. Returned to chair with call bell in reach.   Hildred Alamin Mobility Specialist  Mobility Specialist Phone: [phone]

## 2021-10-13 NOTE — Progress Notes (Signed)
PROGRESS NOTE   Jasmine Richardson  IRW:[id number] DOB: [birth year] DOA: 10/09/2021 PCP: Nicholos Johns, MD  Brief Narrative:  60 year old community dwelling female CABG 7619  complicated by neuritis status post myocutaneous flaps--CAD status post stent 06/22/2020--repeat hospitalization for chest pain 07/22/2021 with Showing no new coronary occlusion, underlying VT status post ICD placement [facility] 01/05/2021 CKD 3 HTN DM TY 2 Seen 9/30 Dr. Cannon Kettle for toe plantar ulceration and cellulitis- had left great toe amputation 10/19 ABI was 0.82 10/07/2019 and was discharged on Bactrim Reviewed on dressing changes and noted necrotic tissue and streaking redness to surgical site patient was admitted 10/24--CT foot showed a posterior soft tissue edema Dr. Cannon Kettle of podiatry saw the patient developed worsening symptoms and came back to the ED She underwent surgery and also had angiogram She is stabilizing  Hospital-Problem based course  Cellulitis necrosis of amputation site with group B strep Status post left foot incision and I&D Await deep wound culture 10/25-no growth on Wound cult-? WBC steadily improving ?Anaerobe growing--will follow cult and narrow as approp PWB --Pt recs OP pt for balance/gait and a WC--ordered On Tylenol only at this time for pain and is controlled--QOD dressing changes to LLE with packing and dry dressing Vascular surgery performed angiogram stenting and intervention 10/26  Significant CAD history as above Continue aspirin 81 daily, Plavix 75 50 daily Imdur 30 daily metoprolol 25 XL 3 times daily Crestor 20 nightly aspirin 81 Outpatient can resume [place] VT status post PPM 2022 Continue amiodarone 200 daily keep on monitors-predoninantly in NSR HFpEF Lasix held at this time-resume in the outpatient setting DM TY 2 A1c 12.8 CBGs 130-200, eating 100% of meals Resume metformin 500 twice daily  Takes tresiba 45 units of insulin at home, Humalog 20 in the evening---Lantus  increased to 20 U Resumed Farxiga    DVT prophylaxis: Lovenox Code Status: Presumed full Family Communication: None present Disposition:  Status is: Inpatient  Remains inpatient appropriate because:   Need for further work-up    Consultants:  Podiatry Vascular surgery  Procedures:  Angiogram and stent 10/26 by Vascular  Antimicrobials: Currently Zosyn-->augmentin 10/27   Subjective:  Ambulatory no fever chills n/v No cp No diarr Pain moderate  Objective: Vitals:   10/13/21 0400 10/13/21 0425 10/13/21 0914 10/13/21 1245  BP:  132/71 (!) 146/86 (!) 148/89  Pulse:  65 75 72  Resp: 20 19 14 16   Temp:  98.3 F (36.8 C) (!) 97.4 F (36.3 C) (!) 97.4 F (36.3 C)  TempSrc:  Oral Oral Oral  SpO2:  90% 100% 99%  Weight:      Height:        Intake/Output Summary (Last 24 hours) at 10/13/2021 1410 Last data filed at 10/13/2021 0900 Gross per 24 hour  Intake 480 ml  Output --  Net 480 ml    Filed Weights   10/10/21 0026 10/12/21 0407  Weight: 113.4 kg 112.8 kg    Examination:   coherent in nad no focal deficit Cta b no added sound S1 s 2no m/r/g--monitors show NSR Abd soft nt nd no rebound no guard Wound in re-in forced dressing Psych euthymic  Data Reviewed: personally reviewed   CBC    Component Value Date/Time   WBC 11.8 (H) 10/13/2021 0211   RBC 3.39 (L) 10/13/2021 0211   HGB 9.1 (L) 10/13/2021 0211   HGB 10.4 (L) 06/17/2020 0933   HCT 29.6 (L) 10/13/2021 0211   HCT 32.4 (L) 06/17/2020 0933   PLT 413 (  H) 10/13/2021 0211   PLT 269 06/17/2020 0933   MCV 87.3 10/13/2021 0211   MCV 86 06/17/2020 0933   MCH 26.8 10/13/2021 0211   MCHC 30.7 10/13/2021 0211   RDW 13.5 10/13/2021 0211   RDW 14.2 06/17/2020 0933   LYMPHSABS 1.9 10/09/2021 1950   MONOABS 0.4 10/09/2021 1950   EOSABS 0.1 10/09/2021 1950   BASOSABS 0.0 10/09/2021 1950   CMP Latest Ref Rng & Units 10/13/2021 10/12/2021 10/11/2021  Glucose 70 - 99 mg/dL 170(H) 142(H) 188(H)   BUN 8 - 23 mg/dL 10 9 9   Creatinine 0.44 - 1.00 mg/dL 1.14(H) 0.98 1.08(H)  Sodium 135 - 145 mmol/L 135 136 136  Potassium 3.5 - 5.1 mmol/L 3.9 4.0 4.5  Chloride 98 - 111 mmol/L 104 106 104  CO2 22 - 32 mmol/L 26 24 24   Calcium 8.9 - 10.3 mg/dL 9.0 9.0 9.1  Total Protein 6.5 - 8.1 g/dL - - -  Total Bilirubin 0.3 - 1.2 mg/dL - - -  Alkaline Phos 38 - 126 U/L - - -  AST 15 - 41 U/L - - -  ALT 0 - 44 U/L - - -     Radiology Studies: PERIPHERAL VASCULAR CATHETERIZATION  Result Date: 10/11/2021 Patient name: Jasmine Richardson            MRN: [id number]        DOB: 10-Sep-1961        Sex: female  10/11/2021 Pre-operative Diagnosis: Critical limb ischemia of the left lower extremity with tissue loss Post-operative diagnosis:  Same Surgeon:  Marty Heck, MD Procedure Performed: 1.  Ultrasound-guided access right common femoral artery 2.  Aortogram with catheter selection of aorta 3.  Left lower extremity arteriogram with selection of third order branches 4.  Left anterior tibial artery angioplasty (2.5 mm x 150 mm Sterling) 5.  55 minutes of monitored moderate conscious sedation time  Indications: Patient is a 60 year old female seen after she underwent I&D of a nonhealing left hallux amputation at [place].  She had monophasic runoff distally with no palpable pedal pulses.  We recommended lower extremity arteriogram with possible intervention after risk benefits discussed.  Findings:  Aortogram showed patent renal arteries bilaterally.  The infrarenal aorta and bilateral iliacs were widely patent with no flow limiting stenosis.  Left lower extremity runoff showed a patent common femoral and patent but diseased profunda.  SFA was widely patent.  Above and below-knee popliteal artery widely patent.  She does have tibial disease.  Dominant runoff is in the anterior tibial and peroneal.  The posterior tibial appears to occlude shortly after takeoff.  Anterior tibial had multiple high-grade stenosis >80%  throughout the proximal to mid segment and did have in-line flow to a dorsalis pedis into the foot.  The peroneal was diseased and very small in the mid to distal calf.  The left anterior tibial was selected and the multi-focal high-grade stenosis was crossed antegrade.  This was treated with a 2.5 mm x 150 mm Sterling to nominal pressure for 2 minutes.  Excellent results with no residual stenosis.  Preserved runoff.  I elected not to treat the peroneal given this was very small and I thought would be high risk of damaging the vessel.             Procedure:  The patient was identified in the holding area and taken to room 8.  The patient was then placed supine on the table and prepped and draped in the  usual sterile fashion.  A time out was called.  Ultrasound was used to evaluate the right common femoral artery.  It was patent .  A digital ultrasound image was acquired.  A micropuncture needle was used to access the right common femoral artery under ultrasound guidance.  An 018 wire was advanced without resistance and a micropuncture sheath was placed.  The 018 wire was removed and a benson wire was placed.  The micropuncture sheath was exchanged for a 5 french sheath.  An omniflush catheter was advanced over the wire to the level of L-1.  An abdominal angiogram was obtained.  Next, using the omniflush catheter and a benson wire, the aortic bifurcation was crossed and the catheter was placed into theleft external iliac artery and left runoff was obtained.  After evaluating images elected to intervene on her tibial disease.  We used a Rosen wire down the left SFA and exchanged for a long 5 [place] Ansell sheath in the right groin over the aortic bifurcation.  Patient was given 100 units per kilogram IV heparin.  We then used the straight 100 cm flush catheter to get a wire down into the popliteal artery.  We exchanged for a V 18 with a 018 quick cross.  We were able to cannulate the anterior tibial antegrade and  crossed all the high-grade stenosis and got a wire to the distal ankle.  We then elected to angioplasty all the stenosis in the proximal to mid anterior tibial artery with a 2.5 mm x 150 mm Sterling to nominal pressure for 2 minutes.  Excellent results with no residual stenosis.  Preserved runoff distally into the foot through the dorsalis pedis.  Wires and catheters were removed.  I did get a groin access shot.  I elected not to close.  Taken to holding to have manual pressure and sheath removal.   Plan: Patient will need aspirin Plavix statin.  She is optimized from a vascular standpoint.  Marty Heck, MD [facility] Office: [phone]     Scheduled Meds:  amiodarone  200 mg Oral Daily   amoxicillin-clavulanate  1 tablet Oral Q12H   aspirin EC  81 mg Oral Daily   clopidogrel  75 mg Oral Daily   dapagliflozin propanediol  10 mg Oral QAC breakfast   fenofibrate  160 mg Oral Daily   furosemide  20 mg Oral Daily   influenza vac split quadrivalent PF  0.5 mL Intramuscular Tomorrow-1000   insulin aspart  0-20 Units Subcutaneous Q4H   insulin glargine-yfgn  15 Units Subcutaneous Daily   irbesartan  150 mg Oral Daily   isosorbide mononitrate  30 mg Oral Daily   metFORMIN  500 mg Oral BID WC   metoprolol succinate  25 mg Oral TID   mupirocin ointment  1 application Nasal BID   mupirocin ointment   Topical Daily   pantoprazole  40 mg Oral Daily   rosuvastatin  20 mg Oral QHS   sodium chloride flush  3 mL Intravenous Q12H   Continuous Infusions:  sodium chloride       LOS: 4 days   Time spent:   Nita Sells, MD [facility] To contact the attending provider between 7A-7P or the covering provider during after hours 7P-7A, please log into the web site [email or web address] and access using universal  password for that web site. If you do not have the password, please call the hospital operator.  10/13/2021, 2:10 PM

## 2021-10-13 NOTE — Progress Notes (Signed)
Mobility Specialist: Progress Note   10/13/21 1704  Mobility  Activity Ambulated in hall  Level of Assistance Modified independent, requires aide device or extra time  Assistive Device Front wheel walker  Distance Ambulated (ft) 150 ft  Mobility Ambulated with assistance in hallway  Mobility Response Tolerated well  Mobility performed by Mobility specialist  $Mobility charge 1 Mobility   Post-Mobility: 84 HR  Pt independent to stand and mod I during ambulation. Pt c/o general fatigue during session and minimal pain she rated 1-2/10 in her L foot. Pt back to bed after walk with call bell at her side and family member present in the room.   [facility] Vito Beg Mobility Specialist Mobility Specialist Phone: [phone]

## 2021-10-14 LAB — CULTURE, BLOOD (ROUTINE X 2): Culture: NO GROWTH

## 2021-10-14 LAB — GLUCOSE, CAPILLARY
Glucose-Capillary: 197 mg/dL — ABNORMAL HIGH (ref 70–99)
Glucose-Capillary: 246 mg/dL — ABNORMAL HIGH (ref 70–99)

## 2021-10-14 LAB — CBC WITH DIFFERENTIAL/PLATELET
Abs Immature Granulocytes: 0.24 10*3/uL — ABNORMAL HIGH (ref 0.00–0.07)
Basophils Absolute: 0.1 10*3/uL (ref 0.0–0.1)
Basophils Relative: 1 %
Eosinophils Absolute: 0.1 10*3/uL (ref 0.0–0.5)
Eosinophils Relative: 1 %
HCT: 28.8 % — ABNORMAL LOW (ref 36.0–46.0)
Hemoglobin: 9 g/dL — ABNORMAL LOW (ref 12.0–15.0)
Immature Granulocytes: 2 %
Lymphocytes Relative: 12 %
Lymphs Abs: 1.3 10*3/uL (ref 0.7–4.0)
MCH: 27 pg (ref 26.0–34.0)
MCHC: 31.3 g/dL (ref 30.0–36.0)
MCV: 86.5 fL (ref 80.0–100.0)
Monocytes Absolute: 0.8 10*3/uL (ref 0.1–1.0)
Monocytes Relative: 7 %
Neutro Abs: 8.7 10*3/uL — ABNORMAL HIGH (ref 1.7–7.7)
Neutrophils Relative %: 77 %
Platelets: 398 10*3/uL (ref 150–400)
RBC: 3.33 MIL/uL — ABNORMAL LOW (ref 3.87–5.11)
RDW: 13.4 % (ref 11.5–15.5)
WBC: 11.2 10*3/uL — ABNORMAL HIGH (ref 4.0–10.5)
nRBC: 0 % (ref 0.0–0.2)

## 2021-10-14 LAB — AEROBIC/ANAEROBIC CULTURE W GRAM STAIN (SURGICAL/DEEP WOUND)

## 2021-10-14 MED ORDER — ISOSORBIDE MONONITRATE ER 30 MG PO TB24: 30.0000 mg | ORAL_TABLET | Freq: Every day | ORAL | 0 refills | Status: DC

## 2021-10-14 MED ORDER — VALSARTAN 80 MG PO TABS
80.0000 mg | ORAL_TABLET | Freq: Two times a day (BID) | ORAL | 3 refills | Status: DC
Start: 2021-10-14 — End: 2022-03-05

## 2021-10-14 MED ORDER — TRESIBA FLEXTOUCH 100 UNIT/ML ~~LOC~~ SOPN: 20.0000 [IU] | PEN_INJECTOR | Freq: Every day | SUBCUTANEOUS | 1 refills | Status: DC

## 2021-10-14 MED ORDER — ACETAMINOPHEN 325 MG PO TABS: 650.0000 mg | ORAL_TABLET | ORAL | Status: AC | PRN

## 2021-10-14 MED ORDER — FLUCONAZOLE 100 MG PO TABS: 100.0000 mg | ORAL_TABLET | Freq: Every day | ORAL | 0 refills | Status: AC

## 2021-10-14 MED ORDER — AMOXICILLIN-POT CLAVULANATE 875-125 MG PO TABS: 1.0000 | ORAL_TABLET | Freq: Two times a day (BID) | ORAL | 0 refills | Status: AC

## 2021-10-14 MED ORDER — METOPROLOL SUCCINATE ER 25 MG PO TB24: 25.0000 mg | ORAL_TABLET | Freq: Three times a day (TID) | ORAL | 0 refills | Status: DC

## 2021-10-14 NOTE — Progress Notes (Signed)
Pt being d/c, VSS, education complete, IV out prior to shift change, telebox returned, equipment will be delivered to home on Monday 10/16/21  Chrisandra Carota, RN 10/14/2021 2:32 PM

## 2021-10-14 NOTE — Discharge Summary (Signed)
Physician Discharge Summary  Jasmine Richardson WLN:[id number] DOB: Jun 08, 1961 DOA: 10/09/2021  PCP: Nicholos Johns, MD  Admit date: 10/09/2021 Discharge date: 10/14/2021  Time spent: 26 minutes  Recommendations for Outpatient Follow-up:  Needs Chem-12 CBC in about 1 week at PCP office Outpatient follow-up Dr. Cannon Kettle regarding wound care--she is aware of patient's discharge I will CC her note and patient is to call their office if she does not hear from them in 1 week Note dosage change and decrease of insulin dosing Will require completion of outpatient Augmentin and added this admission short course of fluconazole given yeast growing in wound although may be a contaminant  Discharge Diagnoses:  MAIN problem for hospitalization   Cellulitis and necrosis of amputation site status post surgery and angiogram this admission  Please see below for itemized issues addressed in [place]- refer to other progress notes for clarity if needed  Discharge Condition: Improved  Diet recommendation: Diabetic heart healthy  Filed Weights   10/10/21 0026 10/12/21 0407  Weight: 113.4 kg 112.8 kg    History of present illness:  60 year old community dwelling female CABG 7408 complicated by chest wound--CAD status post stent 06/22/2020--repeat hospitalization for chest pain 07/22/2021 with Showing no new coronary occlusion, underlying VT status post ICD placement [facility] 01/05/2021 CKD 3 HTN DM TY 2 Seen 9/30 Dr. Cannon Kettle for toe plantar ulceration and cellulitis- had left great toe amputation 10/19 ABI was 0.82 10/07/2019 and was discharged on Bactrim Reviewed on dressing changes and noted necrotic tissue and streaking redness to surgical site patient was admitted 10/24--CT foot showed a posterior soft tissue edema Dr. Cannon Kettle of podiatry saw the patient developed worsening symptoms and came back to the ED She underwent surgery and also had angiogram by vascular surgery secondary to critical left lower limb  ischemia with AT angioplasty She is stabilizing  Hospital Course:  Cellulitis necrosis of amputation site with group B strep Status post left foot incision and I&D Wound culture 10/25?Anaerobe growing in addition to yeast-complete antibiotics as per [facility] at least 2 weeks of oral antibiotics and we will give 7 days of fluconazole PWB --Pt recs OP pt for balance/gait and a WC--ordered On Tylenol only at this time for pain and is controlled--QOD dressing changes to LLE with packing and dry dressing Home health has been requested to come out Vascular surgery performed angiogram stenting and intervention 10/26 --Will require outpatient coordination with vascular surgery in addition Significant CAD history as above Continue aspirin 81 daily, Plavix 75 50 daily Imdur 30 daily metoprolol 25 XL 3 times daily Crestor 20 nightly aspirin 81 Outpatient can resume Repatha VT status post PPM 2022 Continue amiodarone 200 daily keep on monitors-predoninantly in NSR HFpEF Lasix held at this time-resume in the outpatient setting DM TY 2 A1c 12.8 CBGs up to 200 eating 100% of meals Resume metformin 500 twice daily  Insulins adjusted during hospital stay and cut back slightly as sugars were lower and she did not require as much long-acting insulin-they can be adjusted upwards in the outpatient setting    Procedures: 10/25 left foot incision and drainage with wound culture Dr. Cannon Kettle 10/26 Surgeon:  Marty Heck, MD Procedure Performed: 1.  Ultrasound-guided access right common femoral artery 2.  Aortogram with catheter selection of aorta 3.  Left lower extremity arteriogram with selection of third order branches 4.  Left anterior tibial artery angioplasty (2.5 mm x 150 mm Sterling) 5.  55 minutes of monitored moderate conscious sedation time  Consultations: Podiatrist Vascular  surgeon  Discharge Exam: Vitals:   10/13/21 2351 10/14/21 0326  BP: 136/68 127/63  Pulse: 73 68  Resp: 17 20   Temp: 98 F (36.7 C) 98 F (36.7 C)  SpO2: 95% 95%    Subj on day of d/c   Doing fair no distress many questions about post op plan and discharge planning-answered all satisfactorily Ambulatory sound no pain no fever no chills  General Exam on discharge  EOMI NCAT thick neck Mallampati 4 No icterus no pallor Chest clear no added sound Abdomen soft Wound not examined this morning ROM intact otherwise no focal deficit  Discharge Instructions   Discharge Instructions     Diet - low sodium heart healthy   Complete by: As directed    Discharge instructions   Complete by: As directed    Please look your meds carefully some have changed you will probably require the same amount of insulin in the outpatient setting however at this time please use less as per instructions as your sugars have been lower than usual. You have been prescribed antibiotics and an antifungal please complete the full course of treatment You may take Tylenol for pain You will need dressing changes with home health so we will order home health to come out Please follow-up with your regular physician in about 1 week and get lab work done at that time including liver function tests Dr. Cannon Kettle should see you in the clinic-if you do not hear from her by the early part of the week please call her so that she can reassess your wound   Discharge wound care:   Complete by: As directed    10/10/21 1955    Wound care  Every shift      Comments: Left foot if bloody strike through re-inforce with  4x4, kerlix, and ace wrap, any questions call Dr. Cannon Kettle [phone]  10/10/21 1956   10/10/21 1305    Wound care  Daily      Comments: Right breast wound: cleans with NS then apply a coat of Mupirocin (Bactroban) ointment, cover with a non-adherent dressing. Change daily.  10/10/21 1304   10/10/21 1304    Wound care  Until discontinued      Comments: Continue sacral foam dressing. Peel down all sites with a foam dressing  EACH shift.  Record your observations.  Change foam dressing every 3 days or PRN soiling.  10/10/21 1304   Increase activity slowly   Complete by: As directed       Allergies as of 10/14/2021       Reactions   Chlorhexidine Gluconate Itching   Received CHG bath, began itching, required benadryl   Cat Hair Extract Other (See Comments)   Sneezing, watery eyes.   Tramadol Other (See Comments)   Hallucinations   Vancomycin    Other reaction(s): Anaphylaxis*   Codeine Rash        Medication List     STOP taking these medications    LORazepam 1 MG tablet Commonly known as: ATIVAN       TAKE these medications    acetaminophen 325 MG tablet Commonly known as: TYLENOL Take 2 tablets (650 mg total) by mouth every 4 (four) hours as needed for headache or mild pain.   amiodarone 200 MG tablet Commonly known as: PACERONE Take 1 tablet (200 mg total) by mouth daily.   amoxicillin-clavulanate 875-125 MG tablet Commonly known as: AUGMENTIN Take 1 tablet by mouth every 12 (twelve) hours for 8  days. What changed: when to take this   aspirin EC 81 MG tablet Take 1 tablet (81 mg total) by mouth daily.   cholecalciferol 25 MCG (1000 UNIT) tablet Commonly known as: VITAMIN D3 Take 1,000 Units by mouth 2 (two) times a week.   clopidogrel 75 MG tablet Commonly known as: PLAVIX Take 1 tablet (75 mg total) by mouth daily.   cyanocobalamin 1000 MCG tablet Take 1,000 mcg by mouth daily.   dapagliflozin propanediol 10 MG Tabs tablet Commonly known as: Farxiga Take 1 tablet (10 mg total) by mouth daily before breakfast.   fenofibrate 145 MG tablet Commonly known as: Tricor Take 1 tablet (145 mg total) by mouth daily.   fluconazole 100 MG tablet Commonly known as: Diflucan Take 1 tablet (100 mg total) by mouth daily for 7 days.   furosemide 20 MG tablet Commonly known as: LASIX Take 20 mg by mouth daily.   HumaLOG KwikPen 100 UNIT/ML KwikPen Generic drug: insulin  lispro Inject 10-50 Units into the skin at bedtime. Sliding scale   isosorbide mononitrate 30 MG 24 hr tablet Commonly known as: IMDUR Take 1 tablet (30 mg total) by mouth daily.   metFORMIN 500 MG tablet Commonly known as: GLUCOPHAGE Take 1 tablet (500 mg total) by mouth 2 (two) times daily.   metoprolol succinate 25 MG 24 hr tablet Commonly known as: Toprol XL Take 1 tablet (25 mg total) by mouth 3 (three) times daily. What changed: Another medication with the same name was removed. Continue taking this medication, and follow the directions you see here.   nitroGLYCERIN 0.4 MG SL tablet Commonly known as: NITROSTAT Place 1 tablet (0.4 mg total) under the tongue every 5 (five) minutes as needed for chest pain.   nystatin powder Commonly known as: MYCOSTATIN/NYSTOP Apply 1 application topically daily as needed (rash/yeast).   omeprazole 20 MG capsule Commonly known as: PRILOSEC Take 1 capsule (20 mg total) by mouth daily.   Repatha SureClick 381 MG/ML Soaj Generic drug: Evolocumab Inject 140 mg into the skin every 14 (fourteen) days.   rosuvastatin 20 MG tablet Commonly known as: CRESTOR Take 1 tablet (20 mg total) by mouth at bedtime.   Tyler Aas FlexTouch 100 UNIT/ML FlexTouch Pen Generic drug: insulin degludec Inject 20 Units into the skin daily. What changed: how much to take   valsartan 80 MG tablet Commonly known as: Diovan Take 1 tablet (80 mg total) by mouth 2 (two) times daily.   vitamin C 250 MG tablet Commonly known as: ASCORBIC ACID Take 250 mg by mouth at bedtime.               Durable Medical Equipment  (From admission, onward)           Start     Ordered   10/14/21 0844  DME Gilford Rile  Once       Comments: Rollator walker  Question Answer Comment  Walker: With 5 Inch Wheels   Patient needs a walker to treat with the following condition Wound dehiscence      10/14/21 0848   10/13/21 1414  For home use only DME standard manual wheelchair  with seat cushion  Once       Comments: Patient suffers from wound which impairs their ability to perform daily activities like bathing, dressing, feeding, grooming, and toileting in the home.  A walker will not resolve issue with performing activities of daily living. A wheelchair will allow patient to safely perform daily activities. Patient can safely  propel the wheelchair in the home or has a caregiver who can provide assistance. Length of need Lifetime. Accessories: elevating leg rests (ELRs), wheel locks, extensions and anti-tippers.   10/13/21 1414              Discharge Care Instructions  (From admission, onward)           Start     Ordered   10/14/21 0000  Discharge wound care:       Comments: 10/10/21 1955    Wound care  Every shift      Comments: Left foot if bloody strike through re-inforce with  4x4, kerlix, and ace wrap, any questions call Dr. Cannon Kettle [phone]  10/10/21 1956   10/10/21 1305    Wound care  Daily      Comments: Right breast wound: cleans with NS then apply a coat of Mupirocin (Bactroban) ointment, cover with a non-adherent dressing. Change daily.  10/10/21 1304   10/10/21 1304    Wound care  Until discontinued      Comments: Continue sacral foam dressing. Peel down all sites with a foam dressing EACH shift.  Record your observations.  Change foam dressing every 3 days or PRN soiling.  10/10/21 1304   10/14/21 0848           Allergies  Allergen Reactions   Chlorhexidine Gluconate Itching    Received CHG bath, began itching, required benadryl   Cat Hair Extract Other (See Comments)    Sneezing, watery eyes.   Tramadol Other (See Comments)    Hallucinations   Vancomycin     Other reaction(s): Anaphylaxis*   Codeine Rash    Follow-up Information     Marty Heck, MD Follow up.   Specialty: Vascular Surgery Why: office will call you to arrange your appt (sent) Contact information: [address] [place]  [phone]                  The results of significant diagnostics from this hospitalization (including imaging, microbiology, ancillary and laboratory) are listed below for reference.    Significant Diagnostic Studies: CT FOOT LEFT W CONTRAST  Result Date: 10/09/2021 CLINICAL DATA:  Concern for osteomyelitis. Redness and swelling of the first digit in a patient status post recent transmetatarsal amputation of the left first digit EXAM: CT OF THE LOWER LEFT EXTREMITY WITH CONTRAST TECHNIQUE: Multidetector CT imaging of the lower left extremity was performed according to the standard protocol following intravenous contrast administration. CONTRAST:  11mL OMNIPAQUE IOHEXOL 350 MG/ML SOLN COMPARISON:  X-ray left foot 10/03/2021, x-ray left foot 10/09/2021 FINDINGS: Bones/Joint/Cartilage Status post transmetatarsal amputation of the left first digit. No cortical erosion or destruction. No acute displaced fracture. No dislocation. No effusion. Ligaments Suboptimally assessed by CT. Muscles and Tendons Grossly unremarkable. Soft tissues Subcutaneus soft tissue edema and emphysema of the first digit with overlying skin staples. No organized fluid collection. Mild subcutaneus soft tissue edema along the dorsum of the foot. No retained radiopaque foreign body. IMPRESSION: No CT findings to suggest osteomyelitis in a patient with subcutaneus soft tissue edema and emphysema along a recent first digit transmetatarsal amputation. Findings may be postsurgical with superimposed infection not excluded. Please note necrotizing fasciitis is a clinical diagnosis and cannot be excluded. No organized fluid collection. No retained radiopaque foreign body. Skin staples are noted overlying the wound. Electronically Signed   By: Iven Finn M.D.   On: 10/09/2021 23:16   PERIPHERAL VASCULAR CATHETERIZATION  Result Date:  10/11/2021 Patient name: Jasmine Richardson            MRN: [id number]        DOB:  1961/06/22        Sex: female  10/11/2021 Pre-operative Diagnosis: Critical limb ischemia of the left lower extremity with tissue loss Post-operative diagnosis:  Same Surgeon:  Marty Heck, MD Procedure Performed: 1.  Ultrasound-guided access right common femoral artery 2.  Aortogram with catheter selection of aorta 3.  Left lower extremity arteriogram with selection of third order branches 4.  Left anterior tibial artery angioplasty (2.5 mm x 150 mm Sterling) 5.  55 minutes of monitored moderate conscious sedation time  Indications: Patient is a 60 year old female seen after she underwent I&D of a nonhealing left hallux amputation at [facility].  She had monophasic runoff distally with no palpable pedal pulses.  We recommended lower extremity arteriogram with possible intervention after risk benefits discussed.  Findings:  Aortogram showed patent renal arteries bilaterally.  The infrarenal aorta and bilateral iliacs were widely patent with no flow limiting stenosis.  Left lower extremity runoff showed a patent common femoral and patent but diseased profunda.  SFA was widely patent.  Above and below-knee popliteal artery widely patent.  She does have tibial disease.  Dominant runoff is in the anterior tibial and peroneal.  The posterior tibial appears to occlude shortly after takeoff.  Anterior tibial had multiple high-grade stenosis >80% throughout the proximal to mid segment and did have in-line flow to a dorsalis pedis into the foot.  The peroneal was diseased and very small in the mid to distal calf.  The left anterior tibial was selected and the multi-focal high-grade stenosis was crossed antegrade.  This was treated with a 2.5 mm x 150 mm Sterling to nominal pressure for 2 minutes.  Excellent results with no residual stenosis.  Preserved runoff.  I elected not to treat the peroneal given this was very small and I thought would be high risk of damaging the vessel.             Procedure:  The patient was  identified in the holding area and taken to room 8.  The patient was then placed supine on the table and prepped and draped in the usual sterile fashion.  A time out was called.  Ultrasound was used to evaluate the right common femoral artery.  It was patent .  A digital ultrasound image was acquired.  A micropuncture needle was used to access the right common femoral artery under ultrasound guidance.  An 018 wire was advanced without resistance and a micropuncture sheath was placed.  The 018 wire was removed and a benson wire was placed.  The micropuncture sheath was exchanged for a 5 french sheath.  An omniflush catheter was advanced over the wire to the level of L-1.  An abdominal angiogram was obtained.  Next, using the omniflush catheter and a benson wire, the aortic bifurcation was crossed and the catheter was placed into theleft external iliac artery and left runoff was obtained.  After evaluating images elected to intervene on her tibial disease.  We used a Rosen wire down the left SFA and exchanged for a long 5 [place] Ansell sheath in the right groin over the aortic bifurcation.  Patient was given 100 units per kilogram IV heparin.  We then used the straight 100 cm flush catheter to get a wire down into the popliteal artery.  We exchanged for a V 18 with a 018  quick cross.  We were able to cannulate the anterior tibial antegrade and crossed all the high-grade stenosis and got a wire to the distal ankle.  We then elected to angioplasty all the stenosis in the proximal to mid anterior tibial artery with a 2.5 mm x 150 mm Sterling to nominal pressure for 2 minutes.  Excellent results with no residual stenosis.  Preserved runoff distally into the foot through the dorsalis pedis.  Wires and catheters were removed.  I did get a groin access shot.  I elected not to close.  Taken to holding to have manual pressure and sheath removal.   Plan: Patient will need aspirin Plavix statin.  She is optimized from a vascular  standpoint.  Marty Heck, MD [facility] Office: [phone]   DG Chest Portable 1 View  Result Date: 10/09/2021 CLINICAL DATA:  Postoperative shortness of breath. EXAM: PORTABLE CHEST 1 VIEW COMPARISON:  Chest x-ray 10/03/2021. FINDINGS: Lung volumes are low. There is some bibasilar atelectasis. The lungs are otherwise clear. There is no pleural effusion or pneumothorax. There is a small calcified nodule in the right upper lobe, unchanged. The heart is mildly enlarged, unchanged. Left-sided pacemaker present. There are surgical clips overlying the left upper chest. No acute fractures are seen. IMPRESSION: No active disease. Electronically Signed   By: Ronney Asters M.D.   On: 10/09/2021 20:27   DG Foot Complete Left  Result Date: 10/09/2021 CLINICAL DATA:  Left foot pain, status post great toe amputation EXAM: LEFT FOOT - COMPLETE 3+ VIEW COMPARISON:  10/04/2021 FINDINGS: Status post transmetatarsal amputation of the left great toe with overlying skin staples. No fracture or dislocation of the left foot. Diffuse soft tissue edema. IMPRESSION: 1. Status post transmetatarsal amputation of the left great toe with overlying skin staples. 2.  No fracture or dislocation of the left foot. 3.  Diffuse soft tissue edema. Electronically Signed   By: Delanna Ahmadi M.D.   On: 10/09/2021 13:53   CUP PACEART REMOTE DEVICE CHECK  Result Date: 09/19/2021 Scheduled remote reviewed. Normal device function.  Next remote 91 days- JBox, RN/CVRS  VAS [place] LOWER EXTREMITY ARTERIAL DUPLEX (7a-7p)  Result Date: 10/10/2021 LOWER EXTREMITY ARTERIAL DUPLEX STUDY Patient Name:  KYNSLI HAAPALA  Date of Exam:   10/10/2021 Medical Rec #: [id number]      Accession #:    [id number] Date of Birth: May 09, 1961     Patient Gender: F Patient Age:   80 years Exam Location:  [facility] Procedure:      VAS [place] LOWER EXTREMITY ARTERIAL DUPLEX Referring Phys: Vonna Kotyk ZAVITZ  --------------------------------------------------------------------------------  Indications: Left transmetatarsal amputation and post op pain. High Risk Factors: Hypertension, hyperlipidemia, Diabetes, coronary artery                    disease.  Current ABI: n/a Comparison Study: no prior Performing Technologist: Archie Patten RVS  Examination Guidelines: A complete evaluation includes B-mode imaging, spectral Doppler, color Doppler, and power Doppler as needed of all accessible portions of each vessel. Bilateral testing is considered an integral part of a complete examination. Limited examinations for reoccurring indications may be performed as noted.   +-----------+--------+-----+--------+----------+--------------+ LEFT       PSV cm/sRatioStenosisWaveform  Comments       +-----------+--------+-----+--------+----------+--------------+ CFA Prox   104                  triphasic                +-----------+--------+-----+--------+----------+--------------+  DFA        172                  triphasic                +-----------+--------+-----+--------+----------+--------------+ SFA Prox   138                  biphasic                 +-----------+--------+-----+--------+----------+--------------+ SFA Mid    113                  biphasic                 +-----------+--------+-----+--------+----------+--------------+ SFA Distal 69                   biphasic                 +-----------+--------+-----+--------+----------+--------------+ POP Distal 63                   biphasic                 +-----------+--------+-----+--------+----------+--------------+ TP Trunk   88                   biphasic                 +-----------+--------+-----+--------+----------+--------------+ ATA Distal 60                   monophasic               +-----------+--------+-----+--------+----------+--------------+ PTA Distal 34                   monophasic                +-----------+--------+-----+--------+----------+--------------+ PERO Distal                               not visualized +-----------+--------+-----+--------+----------+--------------+  Summary: Left: Distal anterior tibial artery and distal posterior tibial artery have decreased monophasic flow, otherwise no obvious evidence of stenosis noted in the left lower extremity.  See table(s) above for measurements and observations. Electronically signed by Harold Barban MD on 10/10/2021 at 9:17:16 PM.    Final     Microbiology: Recent Results (from the past 240 hour(s))  Resp Panel by RT-PCR (Flu A&B, Covid) Nasopharyngeal Swab     Status: None   Collection Time: 10/09/21  6:51 PM   Specimen: Nasopharyngeal Swab; Nasopharyngeal(NP) swabs in vial transport medium  Result Value Ref Range Status   SARS Coronavirus 2 by RT PCR NEGATIVE NEGATIVE Final    Comment: (NOTE) SARS-CoV-2 target nucleic acids are NOT DETECTED.  The SARS-CoV-2 RNA is generally detectable in upper respiratory specimens during the acute phase of infection. The lowest concentration of SARS-CoV-2 viral copies this assay can detect is 138 copies/mL. A negative result does not preclude SARS-Cov-2 infection and should not be used as the sole basis for treatment or other patient management decisions. A negative result may occur with  improper specimen collection/handling, submission of specimen other than nasopharyngeal swab, presence of viral mutation(s) within the areas targeted by this assay, and inadequate number of viral copies(<138 copies/mL). A negative result must be combined with clinical observations, patient history, and epidemiological information. The expected result is Negative.  Fact Sheet for Patients:  EntrepreneurPulse.com.au  Fact Sheet for  Healthcare Providers:  IncredibleEmployment.be  This test is no t yet approved or cleared by the [place] and  has been  authorized for detection and/or diagnosis of SARS-CoV-2 by FDA under an Emergency Use Authorization (EUA). This EUA will remain  in effect (meaning this test can be used) for the duration of the COVID-19 declaration under Section 564(b)(1) of the Act, 21 U.S.C.section 360bbb-3(b)(1), unless the authorization is terminated  or revoked sooner.       Influenza A by PCR NEGATIVE NEGATIVE Final   Influenza B by PCR NEGATIVE NEGATIVE Final    Comment: (NOTE) The Xpert Xpress SARS-CoV-2/FLU/RSV plus assay is intended as an aid in the diagnosis of influenza from Nasopharyngeal swab specimens and should not be used as a sole basis for treatment. Nasal washings and aspirates are unacceptable for Xpert Xpress SARS-CoV-2/FLU/RSV testing.  Fact Sheet for Patients: EntrepreneurPulse.com.au  Fact Sheet for Healthcare Providers: IncredibleEmployment.be  This test is not yet approved or cleared by the [place] FDA and has been authorized for detection and/or diagnosis of SARS-CoV-2 by FDA under an Emergency Use Authorization (EUA). This EUA will remain in effect (meaning this test can be used) for the duration of the COVID-19 declaration under Section 564(b)(1) of the Act, 21 U.S.C. section 360bbb-3(b)(1), unless the authorization is terminated or revoked.  Performed at [facility], [place] [address], [place], [place] [address]   Blood culture (routine x 2)     Status: None (Preliminary result)   Collection Time: 10/09/21  7:31 PM   Specimen: BLOOD LEFT HAND  Result Value Ref Range Status   Specimen Description BLOOD LEFT HAND  Final   Special Requests   Final    BOTTLES DRAWN AEROBIC AND ANAEROBIC Blood Culture results may not be optimal due to an inadequate volume of blood received in culture bottles   Culture   Final    NO GROWTH 4 DAYS Performed at [facility], [place] [address], [place], [place] [address]    Report Status PENDING   Incomplete  Surgical PCR screen     Status: None   Collection Time: 10/10/21 12:04 PM   Specimen: Nasal Mucosa; Nasal Swab  Result Value Ref Range Status   MRSA, PCR NEGATIVE NEGATIVE Final   Staphylococcus aureus NEGATIVE NEGATIVE Final    Comment: (NOTE) The Xpert SA Assay (FDA approved for NASAL specimens in patients 48 years of age and older), is one component of a comprehensive surveillance program. It is not intended to diagnose infection nor to guide or monitor treatment. Performed at [facility], [place] [address], [place], [place] [address]   Aerobic/Anaerobic Culture w Gram Stain (surgical/deep wound)     Status: None (Preliminary result)   Collection Time: 10/10/21  7:28 PM   Specimen: Wound  Result Value Ref Range Status   Specimen Description WOUND LEFT FOOT  Final   Special Requests NONE  Final   Gram Stain   Final    FEW WBC PRESENT, PREDOMINANTLY MONONUCLEAR NO ORGANISMS SEEN    Culture   Final    RARE YEAST RARE GROUP B STREP(S.AGALACTIAE)ISOLATED TESTING AGAINST S. AGALACTIAE NOT ROUTINELY PERFORMED DUE TO PREDICTABILITY OF AMP/PEN/VAN SUSCEPTIBILITY. Performed at [facility], [place] [address] [place], [place] [address]    Report Status PENDING  Incomplete     Labs: Basic Metabolic Panel: Recent Labs  Lab 10/09/21 1950 10/10/21 0400 10/11/21 0343 10/12/21 0144 10/13/21 0211  NA 134* 136 136 136  135  K 4.2 3.5 4.5 4.0 3.9  CL 101 103 104 106 104  CO2 22 23 24 24 26   GLUCOSE 166* 143* 188* 142* 170*  BUN 11 11 9 9 10   CREATININE 0.90 0.93 1.08* 0.98 1.14*  CALCIUM 9.1 9.0 9.1 9.0 9.0   Liver Function Tests: Recent Labs  Lab 10/09/21 1950  AST 24  ALT 16  ALKPHOS 53  BILITOT 0.5  PROT 7.4  ALBUMIN 2.3*   No results for input(s): LIPASE, AMYLASE in the last 168 hours. No results for input(s): AMMONIA in the last 168 hours. CBC: Recent Labs  Lab 10/09/21 1950 10/10/21 0400 10/11/21 0343 10/12/21 0144 10/13/21 0211  10/14/21 0041  WBC 14.7* 13.4* 13.2* 12.4* 11.8* 11.2*  NEUTROABS 11.9*  --   --   --   --  8.7*  HGB 10.2* 9.5* 9.6* 9.4* 9.1* 9.0*  HCT 33.1* 31.0* 31.6* 30.3* 29.6* 28.8*  MCV 88.3 86.8 88.0 87.1 87.3 86.5  PLT 522* 493* 486* 460* 413* 398   Cardiac Enzymes: Recent Labs  Lab 10/09/21 1931  CKTOTAL 29*   BNP: BNP (last 3 results) Recent Labs    12/18/20 0110 07/20/21 0104  BNP 113.2* 86.1    ProBNP (last 3 results) Recent Labs    01/12/21 1013  PROBNP 246    CBG: Recent Labs  Lab 10/13/21 0425 10/13/21 1213 10/13/21 1626 10/13/21 2104 10/14/21 0608  GLUCAP 198* 206* 169* 203* 197*       Signed:  Nita Sells MD   [facility] 10/14/2021, 8:48 AM

## 2021-10-14 NOTE — TOC Transition Note (Signed)
Transition of Care [facility]) - CM/SW Discharge Note   Patient Details  Name: Jasmine Richardson MRN: [id number] Date of Birth: February 10, 1961  [facility]) CM/SW Contact:  Bartholomew Crews, RN Phone Number: [phone] 10/14/2021, 3:31 PM   Clinical Narrative:     Patient transitioned home today. HH needs for nursing for wound care and PT. Unable to find accepting home health agency.   Interim - left message [facility] - left message [facility] - declined, out of network [place] - declined [facility] - declined [facility] - declined Special educational needs teacher - declined, out of Loss adjuster, chartered - declined, out of [employer] - declined, out of network  Patient to follow up with MD outpatient. Per nursing spouse has been taught how to do dressing changes.   Patient needing manual wheelchair. Referral to [facility] - requested delivery to home on Monday.   No other TOC needs identified at this time.   Final next level of care: Home/Self Care Barriers to Discharge: No Barriers Identified   Patient Goals and CMS Choice        Discharge Placement                       Discharge Plan and Services                DME Arranged: Wheelchair manual DME Agency: [employer] Date DME Agency Contacted: 10/14/21 Time DME Agency Contacted: 1430 Representative spoke with at DME Agency: [facility] Interventions     Readmission Risk Interventions No flowsheet data found.

## 2021-10-18 ENCOUNTER — Encounter: Payer: Self-pay | Admitting: Sports Medicine

## 2021-10-18 ENCOUNTER — Ambulatory Visit (INDEPENDENT_AMBULATORY_CARE_PROVIDER_SITE_OTHER): Payer: Managed Care, Other (non HMO)

## 2021-10-18 ENCOUNTER — Other Ambulatory Visit: Payer: Self-pay

## 2021-10-18 ENCOUNTER — Ambulatory Visit (INDEPENDENT_AMBULATORY_CARE_PROVIDER_SITE_OTHER): Payer: Managed Care, Other (non HMO) | Admitting: Sports Medicine

## 2021-10-18 DIAGNOSIS — Z9889 Other specified postprocedural states: Secondary | ICD-10-CM

## 2021-10-18 DIAGNOSIS — L03032 Cellulitis of left toe: Secondary | ICD-10-CM

## 2021-10-18 DIAGNOSIS — T8189XA Other complications of procedures, not elsewhere classified, initial encounter: Secondary | ICD-10-CM

## 2021-10-18 DIAGNOSIS — L02612 Cutaneous abscess of left foot: Secondary | ICD-10-CM | POA: Diagnosis not present

## 2021-10-18 DIAGNOSIS — Z89419 Acquired absence of unspecified great toe: Secondary | ICD-10-CM

## 2021-10-18 DIAGNOSIS — T8789 Other complications of amputation stump: Secondary | ICD-10-CM

## 2021-10-18 NOTE — Progress Notes (Signed)
Subjective: Jasmine Richardson is a 60 y.o. female patient seen today in office for POV #1 DOS 10/10/21, S/P ID of amputation stump site left foot. Patient denies pain at surgical site at stump but pain at staples at ankle. Patient still taking Augmentin and Flucanozole  for + culture of grp B strept and Candida. No other issues noted.   Patient is assisted by husband this visit.   Patient Active Problem List   Diagnosis Date Noted   Cellulitis, wound, post-operative 10/09/2021   Abnormal mammogram of left breast 09/04/2021   Breast wound, right, subsequent encounter 09/04/2021   Demand ischemia ([place])    ICD (implantable cardioverter-defibrillator) in place 05/16/2021   Ventricular tachycardia 12/17/2020   Unstable angina ([place]) 05/22/2020   NSTEMI (non-ST elevated myocardial infarction) ([place]) 05/22/2020   Diabetic foot ulcer ([place]) 02/16/2020   Angina pectoris ([place]) 09/11/2017   Emphysema lung ([place]) 09/11/2017   Chronic diastolic heart failure ([place]) 11/06/2016   Mediastinitis 06/28/2016   Wound, surgical, infected 06/06/2016   S/P CABG (coronary artery bypass graft) 06/03/2016   Severe sepsis ([place]) 06/03/2016   GERD (gastroesophageal reflux disease) 05/08/2016   Morbid obesity ([place]) 05/08/2016   Type 2 diabetes mellitus with foot ulcer ([place]) 05/08/2016   Sinus tachycardia 05/08/2016   Acute chest pain 05/08/2016   Burping 05/08/2016   Tobacco use disorder 04/20/2016   Coronary artery disease involving native coronary artery of native heart with angina pectoris ([place]) 09/12/2015   Essential hypertension 09/12/2015   Hyperlipidemia 09/12/2015    Current Outpatient Medications on File Prior to Visit  Medication Sig Dispense Refill   acetaminophen (TYLENOL) 325 MG tablet Take 2 tablets (650 mg total) by mouth every 4 (four) hours as needed for headache or mild pain.     amiodarone (PACERONE) 200 MG tablet Take 1 tablet (200 mg total) by mouth daily. 90 tablet 0   amoxicillin-clavulanate  (AUGMENTIN) 875-125 MG tablet Take 1 tablet by mouth every 12 (twelve) hours for 8 days. 16 tablet 0   aspirin EC 81 MG tablet Take 1 tablet (81 mg total) by mouth daily. 90 tablet 0   cholecalciferol (VITAMIN D3) 25 MCG (1000 UNIT) tablet Take 1,000 Units by mouth 2 (two) times a week.     clopidogrel (PLAVIX) 75 MG tablet Take 1 tablet (75 mg total) by mouth daily. 90 tablet 0   cyanocobalamin 1000 MCG tablet Take 1,000 mcg by mouth daily.     dapagliflozin propanediol (FARXIGA) 10 MG TABS tablet Take 1 tablet (10 mg total) by mouth daily before breakfast. 90 tablet 0   Evolocumab (REPATHA SURECLICK) 470 MG/ML SOAJ Inject 140 mg into the skin every 14 (fourteen) days. 2 mL 11   fenofibrate (TRICOR) 145 MG tablet Take 1 tablet (145 mg total) by mouth daily. 90 tablet 0   fluconazole (DIFLUCAN) 100 MG tablet Take 1 tablet (100 mg total) by mouth daily for 7 days. 7 tablet 0   furosemide (LASIX) 20 MG tablet Take 20 mg by mouth daily.     HUMALOG KWIKPEN 100 UNIT/ML KwikPen Inject 10-50 Units into the skin at bedtime. Sliding scale     isosorbide mononitrate (IMDUR) 30 MG 24 hr tablet Take 1 tablet (30 mg total) by mouth daily. 30 tablet 0   metFORMIN (GLUCOPHAGE) 500 MG tablet Take 1 tablet (500 mg total) by mouth 2 (two) times daily. 180 tablet 0   metoprolol succinate (TOPROL XL) 25 MG 24 hr tablet Take 1 tablet (25 mg total) by  mouth 3 (three) times daily. 90 tablet 0   nitroGLYCERIN (NITROSTAT) 0.4 MG SL tablet Place 1 tablet (0.4 mg total) under the tongue every 5 (five) minutes as needed for chest pain. 270 tablet 12   nystatin (MYCOSTATIN/NYSTOP) powder Apply 1 application topically daily as needed (rash/yeast).     omeprazole (PRILOSEC) 20 MG capsule Take 1 capsule (20 mg total) by mouth daily. 90 capsule 0   rosuvastatin (CRESTOR) 20 MG tablet Take 1 tablet (20 mg total) by mouth at bedtime. 90 tablet 0   TRESIBA FLEXTOUCH 100 UNIT/ML FlexTouch Pen Inject 20 Units into the skin daily. 3 mL  1   valsartan (DIOVAN) 80 MG tablet Take 1 tablet (80 mg total) by mouth 2 (two) times daily. 180 tablet 3   vitamin C (ASCORBIC ACID) 250 MG tablet Take 250 mg by mouth at bedtime.     No current facility-administered medications on file prior to visit.    Allergies  Allergen Reactions   Chlorhexidine Gluconate Itching    Received CHG bath, began itching, required benadryl   Cat Hair Extract Other (See Comments)    Sneezing, watery eyes.   Tramadol Other (See Comments)    Hallucinations   Vancomycin     Other reaction(s): Anaphylaxis*   Codeine Rash    Objective: There were no vitals filed for this visit.  General: No acute distress, AAOx3  Left foot: Staples intact with no gapping or dehiscence at surgical site, open amputation stump wound at 1st ray that measures 6x4x1cm with distal 1st met bone exposed, mild pain at medial ankle at area of staples with localized erythema and edema, No pain with calf compression.   Post Op Xray,Left foot: resection of 1st metatarsal, hallux amputation. Soft tissue swelling within normal limits for post op status.   Assessment and Plan:  Problem List Items Addressed This Visit   None Visit Diagnoses     S/P foot surgery, left    -  Primary   Relevant Orders   DG Foot Complete Left   Cellulitis and abscess of toe of left foot       Relevant Orders   DG Foot Complete Left   History of amputation of hallux ([place])       Delayed surgical wound healing of foot amputation stump ([place])            -Patient seen and evaluated -Xrays reviewed  -Applied xeroform to medial ankle and packing to amputation stump covered with dry sterile dressing to surgical site left foot secured with ACE wrap and stockinet  -Advised patient to make sure to keep dressings clean, dry, and intact to left foot surgical site until we can arrange home nursing; Order sent to [facility], plan in 1-2 weeks to add on wound vac -Wound care supplies ordered through [facility]  until home nursing can start -Advised patient to continue with post-op shoe on left and to limit activity or excessive walking or standing   -Rx Rolling walker with seat  -Advised patient to continue with antibiotics -Will plan for wound care at next office visit. In the meantime, patient to call office if any issues or problems arise. May add on [place] next visit.   Landis Martins, DPM

## 2021-10-19 ENCOUNTER — Telehealth: Payer: Self-pay | Admitting: Sports Medicine

## 2021-10-19 NOTE — Telephone Encounter (Signed)
Created in error

## 2021-10-23 ENCOUNTER — Other Ambulatory Visit: Payer: Self-pay

## 2021-10-23 DIAGNOSIS — D72829 Elevated white blood cell count, unspecified: Secondary | ICD-10-CM | POA: Insufficient documentation

## 2021-10-23 DIAGNOSIS — T148XXA Other injury of unspecified body region, initial encounter: Secondary | ICD-10-CM | POA: Insufficient documentation

## 2021-10-23 DIAGNOSIS — I739 Peripheral vascular disease, unspecified: Secondary | ICD-10-CM

## 2021-10-23 DIAGNOSIS — F419 Anxiety disorder, unspecified: Secondary | ICD-10-CM | POA: Insufficient documentation

## 2021-10-23 DIAGNOSIS — N179 Acute kidney failure, unspecified: Secondary | ICD-10-CM

## 2021-10-23 DIAGNOSIS — L089 Local infection of the skin and subcutaneous tissue, unspecified: Secondary | ICD-10-CM | POA: Insufficient documentation

## 2021-10-23 DIAGNOSIS — E871 Hypo-osmolality and hyponatremia: Secondary | ICD-10-CM

## 2021-10-23 DIAGNOSIS — E1165 Type 2 diabetes mellitus with hyperglycemia: Secondary | ICD-10-CM | POA: Insufficient documentation

## 2021-10-23 DIAGNOSIS — K449 Diaphragmatic hernia without obstruction or gangrene: Secondary | ICD-10-CM | POA: Insufficient documentation

## 2021-10-23 DIAGNOSIS — R9439 Abnormal result of other cardiovascular function study: Secondary | ICD-10-CM

## 2021-10-23 DIAGNOSIS — N61 Mastitis without abscess: Secondary | ICD-10-CM

## 2021-10-23 DIAGNOSIS — R9431 Abnormal electrocardiogram [ECG] [EKG]: Secondary | ICD-10-CM

## 2021-10-23 DIAGNOSIS — I96 Gangrene, not elsewhere classified: Secondary | ICD-10-CM

## 2021-10-23 DIAGNOSIS — L02215 Cutaneous abscess of perineum: Secondary | ICD-10-CM

## 2021-10-23 DIAGNOSIS — E669 Obesity, unspecified: Secondary | ICD-10-CM

## 2021-10-23 DIAGNOSIS — R739 Hyperglycemia, unspecified: Secondary | ICD-10-CM | POA: Insufficient documentation

## 2021-10-23 DIAGNOSIS — E872 Acidosis, unspecified: Secondary | ICD-10-CM | POA: Insufficient documentation

## 2021-10-23 HISTORY — DX: Abnormal electrocardiogram (ECG) (EKG): R94.31

## 2021-10-23 HISTORY — DX: Cutaneous abscess of perineum: L02.215

## 2021-10-23 HISTORY — DX: Elevated white blood cell count, unspecified: D72.829

## 2021-10-23 HISTORY — DX: Abnormal result of other cardiovascular function study: R94.39

## 2021-10-23 HISTORY — DX: Local infection of the skin and subcutaneous tissue, unspecified: L08.9

## 2021-10-23 HISTORY — DX: Anxiety disorder, unspecified: F41.9

## 2021-10-23 HISTORY — DX: Obesity, unspecified: E66.9

## 2021-10-23 HISTORY — DX: Hyperglycemia, unspecified: R73.9

## 2021-10-23 HISTORY — DX: Mastitis without abscess: N61.0

## 2021-10-23 HISTORY — DX: Diaphragmatic hernia without obstruction or gangrene: K44.9

## 2021-10-23 HISTORY — DX: Gangrene, not elsewhere classified: I96

## 2021-10-23 HISTORY — DX: Acute kidney failure, unspecified: N17.9

## 2021-10-23 HISTORY — DX: Acidosis, unspecified: E87.20

## 2021-10-23 HISTORY — DX: Type 2 diabetes mellitus with hyperglycemia: E11.65

## 2021-10-23 HISTORY — DX: Hypo-osmolality and hyponatremia: E87.1

## 2021-10-23 HISTORY — DX: Other injury of unspecified body region, initial encounter: T14.8XXA

## 2021-10-25 ENCOUNTER — Ambulatory Visit (INDEPENDENT_AMBULATORY_CARE_PROVIDER_SITE_OTHER): Payer: Managed Care, Other (non HMO) | Admitting: Sports Medicine

## 2021-10-25 ENCOUNTER — Other Ambulatory Visit: Payer: Self-pay

## 2021-10-25 ENCOUNTER — Encounter: Payer: Self-pay | Admitting: Sports Medicine

## 2021-10-25 DIAGNOSIS — L97512 Non-pressure chronic ulcer of other part of right foot with fat layer exposed: Secondary | ICD-10-CM

## 2021-10-25 DIAGNOSIS — T8789 Other complications of amputation stump: Secondary | ICD-10-CM

## 2021-10-25 DIAGNOSIS — E1165 Type 2 diabetes mellitus with hyperglycemia: Secondary | ICD-10-CM

## 2021-10-25 DIAGNOSIS — T8189XA Other complications of procedures, not elsewhere classified, initial encounter: Secondary | ICD-10-CM

## 2021-10-25 DIAGNOSIS — Z9889 Other specified postprocedural states: Secondary | ICD-10-CM

## 2021-10-25 DIAGNOSIS — Z89419 Acquired absence of unspecified great toe: Secondary | ICD-10-CM

## 2021-10-25 NOTE — Progress Notes (Signed)
Subjective: Jasmine Richardson is a 60 y.o. female patient seen today in office for POV #2 DOS 10/10/21, S/P ID of amputation stump site left foot. Patient admits some soreness at ankle and states that she is a little concerned with discoloration at the amputation stump. Patient still taking Augmentin and Flucanozole  for + culture of grp B strept and Candida. No other issues noted.   Patient is assisted by husband this visit.   Patient Active Problem List   Diagnosis Date Noted   Abnormal stress test 10/23/2021   Abnormal EKG 10/23/2021   AKI (acute kidney injury) ([place]) 10/23/2021   Anxiety 10/23/2021   Cellulitis of right breast 10/23/2021   Gangrene ([place]) 10/23/2021   Hematoma 10/23/2021   Hiatal hernia 10/23/2021   Hyperglycemia 10/23/2021   Hyperglycemia due to type 2 diabetes mellitus ([place]) 10/23/2021   Hyponatremia 10/23/2021   Infection of great toe 10/23/2021   Lactic acidosis 10/23/2021   Leukocytosis 10/23/2021   Obesity (BMI 30-39.9) 10/23/2021   Perineal abscess 10/23/2021   Cellulitis, wound, post-operative 10/09/2021   Abnormal mammogram of left breast 09/04/2021   Breast wound, right, subsequent encounter 09/04/2021   Demand ischemia ([place])    ICD (implantable cardioverter-defibrillator) in place 05/16/2021   Ventricular tachycardia 12/17/2020   Unstable angina ([place]) 05/22/2020   NSTEMI (non-ST elevated myocardial infarction) ([place]) 05/22/2020   Diabetic foot ulcer ([place]) 02/16/2020   Angina pectoris ([place]) 09/11/2017   Emphysema lung ([place]) 09/11/2017   Chronic diastolic heart failure ([place]) 11/06/2016   Mediastinitis 06/28/2016   Wound, surgical, infected 06/06/2016   S/P CABG (coronary artery bypass graft) 06/03/2016   Severe sepsis ([place]) 06/03/2016   GERD (gastroesophageal reflux disease) 05/08/2016   Morbid obesity ([place]) 05/08/2016   Type 2 diabetes mellitus with foot ulcer ([place]) 05/08/2016   Sinus tachycardia 05/08/2016   Acute chest pain 05/08/2016   Burping  05/08/2016   Tobacco use disorder 04/20/2016   Coronary artery disease involving native coronary artery of native heart with angina pectoris ([place]) 09/12/2015   Essential hypertension 09/12/2015   Hyperlipidemia 09/12/2015    Current Outpatient Medications on File Prior to Visit  Medication Sig Dispense Refill   acetaminophen (TYLENOL) 325 MG tablet Take 2 tablets (650 mg total) by mouth every 4 (four) hours as needed for headache or mild pain.     amiodarone (PACERONE) 200 MG tablet Take 1 tablet (200 mg total) by mouth daily. 90 tablet 0   cholecalciferol (VITAMIN D3) 25 MCG (1000 UNIT) tablet Take 1,000 Units by mouth 2 (two) times a week.     cyanocobalamin 1000 MCG tablet Take 1,000 mcg by mouth daily.     Evolocumab (REPATHA SURECLICK) 854 MG/ML SOAJ Inject 140 mg into the skin every 14 (fourteen) days. 2 mL 11   fenofibrate (TRICOR) 145 MG tablet Take 1 tablet (145 mg total) by mouth daily. 90 tablet 0   furosemide (LASIX) 20 MG tablet Take 20 mg by mouth daily.     HUMALOG KWIKPEN 100 UNIT/ML KwikPen Inject 10-50 Units into the skin at bedtime. Sliding scale     isosorbide mononitrate (IMDUR) 30 MG 24 hr tablet Take 1 tablet (30 mg total) by mouth daily. 30 tablet 0   metFORMIN (GLUCOPHAGE) 500 MG tablet Take 1 tablet (500 mg total) by mouth 2 (two) times daily. 180 tablet 0   metoprolol succinate (TOPROL XL) 25 MG 24 hr tablet Take 1 tablet (25 mg total) by mouth 3 (three) times daily. 90 tablet 0  nitroGLYCERIN (NITROSTAT) 0.4 MG SL tablet Place 1 tablet (0.4 mg total) under the tongue every 5 (five) minutes as needed for chest pain. 270 tablet 12   nystatin (MYCOSTATIN/NYSTOP) powder Apply 1 application topically daily as needed (rash/yeast).     omeprazole (PRILOSEC) 20 MG capsule Take 1 capsule (20 mg total) by mouth daily. 90 capsule 0   rosuvastatin (CRESTOR) 20 MG tablet Take 1 tablet (20 mg total) by mouth at bedtime. 90 tablet 0   TRESIBA FLEXTOUCH 100 UNIT/ML FlexTouch Pen  Inject 20 Units into the skin daily. 3 mL 1   valsartan (DIOVAN) 80 MG tablet Take 1 tablet (80 mg total) by mouth 2 (two) times daily. 180 tablet 3   vitamin C (ASCORBIC ACID) 250 MG tablet Take 250 mg by mouth at bedtime.     No current facility-administered medications on file prior to visit.    Allergies  Allergen Reactions   Chlorhexidine Gluconate Itching    Received CHG bath, began itching, required benadryl   Cat Hair Extract Other (See Comments)    Sneezing, watery eyes.   Tramadol Other (See Comments)    Hallucinations   Vancomycin     Other reaction(s): Anaphylaxis*   Codeine Rash    Objective: There were no vitals filed for this visit.  General: No acute distress, AAOx3  Left foot: Staples intact with no gapping or dehiscence at surgical sites, open amputation stump wound at 1st ray that measures 5.5x3.5x1cm with distal 1st met bone exposed, mild pain at medial ankle at area of staples with localized erythema and edema, No pain with calf compression.   Assessment and Plan:  Problem List Items Addressed This Visit   None Visit Diagnoses     Delayed surgical wound healing of foot amputation stump ([place])    -  Primary   Right foot ulcer, with fat layer exposed ([place])       History of amputation of hallux ([place])       Uncontrolled type 2 diabetes mellitus with hyperglycemia (H[facility])       S/P foot surgery, left            -Patient seen and evaluated -Applied medihoney to medial ankle and PRISMA to amputation stump covered with dry sterile dressing to surgical site left foot secured with ACE wrap and stockinet  -Advised patient to make sure to keep dressings clean, dry, and intact allowing son to help with changing dressing every other day; advised patient that we have been unable to find a home nursing agency that accepts her insurance -Advised patient to continue with post-op shoe on left and to limit activity or excessive walking or standing like before -Awaiting  Rolling walker with seat  -Advised patient to continue with antibiotics until completed  -Will plan for wound care at next office visit. IVR request for organogensis to help heal amputation stump wound since patient is high risk of limb loss if wound fails to heal.   Landis Martins, DPM

## 2021-10-31 ENCOUNTER — Telehealth: Payer: Self-pay | Admitting: Sports Medicine

## 2021-10-31 NOTE — Telephone Encounter (Signed)
Update re: Home Health referral  I spoke with case [place] with [facility] [phone], Care co-ordination option, then ext. [phone]  [facility] has taken information for pt's care to try to locate a home health agency that can see pt.  Records have been faxed to [facility] at [phone].   Case # [id number]  [email or web address] will update me as soon as she has information.

## 2021-11-03 ENCOUNTER — Encounter: Payer: Self-pay | Admitting: Sports Medicine

## 2021-11-03 ENCOUNTER — Other Ambulatory Visit: Payer: Self-pay

## 2021-11-03 ENCOUNTER — Ambulatory Visit (INDEPENDENT_AMBULATORY_CARE_PROVIDER_SITE_OTHER): Payer: Managed Care, Other (non HMO) | Admitting: Sports Medicine

## 2021-11-03 DIAGNOSIS — Z89419 Acquired absence of unspecified great toe: Secondary | ICD-10-CM

## 2021-11-03 DIAGNOSIS — T8189XA Other complications of procedures, not elsewhere classified, initial encounter: Secondary | ICD-10-CM

## 2021-11-03 DIAGNOSIS — L97512 Non-pressure chronic ulcer of other part of right foot with fat layer exposed: Secondary | ICD-10-CM

## 2021-11-03 DIAGNOSIS — E1165 Type 2 diabetes mellitus with hyperglycemia: Secondary | ICD-10-CM

## 2021-11-03 DIAGNOSIS — R234 Changes in skin texture: Secondary | ICD-10-CM

## 2021-11-03 NOTE — Progress Notes (Signed)
Subjective: Jasmine Richardson is a 60 y.o. female patient seen today in office for POV #3 DOS 10/10/21, S/P ID of amputation stump site left foot. Patient reports that she is doing ok, concerned about the cracking skin on her heel. No other issues noted.   Patient is assisted by husband  and son this visit.   Patient Active Problem List   Diagnosis Date Noted   Abnormal stress test 10/23/2021   Abnormal EKG 10/23/2021   AKI (acute kidney injury) ([place]) 10/23/2021   Anxiety 10/23/2021   Cellulitis of right breast 10/23/2021   Gangrene ([place]) 10/23/2021   Hematoma 10/23/2021   Hiatal hernia 10/23/2021   Hyperglycemia 10/23/2021   Hyperglycemia due to type 2 diabetes mellitus ([place]) 10/23/2021   Hyponatremia 10/23/2021   Infection of great toe 10/23/2021   Lactic acidosis 10/23/2021   Leukocytosis 10/23/2021   Obesity (BMI 30-39.9) 10/23/2021   Perineal abscess 10/23/2021   Cellulitis, wound, post-operative 10/09/2021   Abnormal mammogram of left breast 09/04/2021   Breast wound, right, subsequent encounter 09/04/2021   Demand ischemia ([place])    ICD (implantable cardioverter-defibrillator) in place 05/16/2021   Ventricular tachycardia 12/17/2020   Unstable angina ([place]) 05/22/2020   NSTEMI (non-ST elevated myocardial infarction) ([place]) 05/22/2020   Diabetic foot ulcer ([place]) 02/16/2020   Angina pectoris ([place]) 09/11/2017   Emphysema lung ([place]) 09/11/2017   Chronic diastolic heart failure ([place]) 11/06/2016   Mediastinitis 06/28/2016   Wound, surgical, infected 06/06/2016   S/P CABG (coronary artery bypass graft) 06/03/2016   Severe sepsis ([place]) 06/03/2016   GERD (gastroesophageal reflux disease) 05/08/2016   Morbid obesity ([place]) 05/08/2016   Type 2 diabetes mellitus with foot ulcer ([place]) 05/08/2016   Sinus tachycardia 05/08/2016   Acute chest pain 05/08/2016   Burping 05/08/2016   Tobacco use disorder 04/20/2016   Coronary artery disease involving native coronary artery of native heart  with angina pectoris ([place]) 09/12/2015   Essential hypertension 09/12/2015   Hyperlipidemia 09/12/2015    Current Outpatient Medications on File Prior to Visit  Medication Sig Dispense Refill   acetaminophen (TYLENOL) 325 MG tablet Take 2 tablets (650 mg total) by mouth every 4 (four) hours as needed for headache or mild pain.     amiodarone (PACERONE) 200 MG tablet Take 1 tablet (200 mg total) by mouth daily. 90 tablet 0   cholecalciferol (VITAMIN D3) 25 MCG (1000 UNIT) tablet Take 1,000 Units by mouth 2 (two) times a week.     cyanocobalamin 1000 MCG tablet Take 1,000 mcg by mouth daily.     Evolocumab (REPATHA SURECLICK) 562 MG/ML SOAJ Inject 140 mg into the skin every 14 (fourteen) days. 2 mL 11   fenofibrate (TRICOR) 145 MG tablet Take 1 tablet (145 mg total) by mouth daily. 90 tablet 0   furosemide (LASIX) 20 MG tablet Take 20 mg by mouth daily.     HUMALOG KWIKPEN 100 UNIT/ML KwikPen Inject 10-50 Units into the skin at bedtime. Sliding scale     isosorbide mononitrate (IMDUR) 30 MG 24 hr tablet Take 1 tablet (30 mg total) by mouth daily. 30 tablet 0   metFORMIN (GLUCOPHAGE) 500 MG tablet Take 1 tablet (500 mg total) by mouth 2 (two) times daily. 180 tablet 0   metoprolol succinate (TOPROL XL) 25 MG 24 hr tablet Take 1 tablet (25 mg total) by mouth 3 (three) times daily. 90 tablet 0   nitroGLYCERIN (NITROSTAT) 0.4 MG SL tablet Place 1 tablet (0.4 mg total) under the tongue every 5 (  five) minutes as needed for chest pain. 270 tablet 12   nystatin (MYCOSTATIN/NYSTOP) powder Apply 1 application topically daily as needed (rash/yeast).     omeprazole (PRILOSEC) 20 MG capsule Take 1 capsule (20 mg total) by mouth daily. 90 capsule 0   rosuvastatin (CRESTOR) 20 MG tablet Take 1 tablet (20 mg total) by mouth at bedtime. 90 tablet 0   TRESIBA FLEXTOUCH 100 UNIT/ML FlexTouch Pen Inject 20 Units into the skin daily. 3 mL 1   valsartan (DIOVAN) 80 MG tablet Take 1 tablet (80 mg total) by mouth 2  (two) times daily. 180 tablet 3   vitamin C (ASCORBIC ACID) 250 MG tablet Take 250 mg by mouth at bedtime.     No current facility-administered medications on file prior to visit.    Allergies  Allergen Reactions   Chlorhexidine Gluconate Itching    Received CHG bath, began itching, required benadryl   Cat Hair Extract Other (See Comments)    Sneezing, watery eyes.   Tramadol Other (See Comments)    Hallucinations   Vancomycin     Other reaction(s): Anaphylaxis*   Codeine Rash    Objective: There were no vitals filed for this visit.  General: No acute distress, AAOx3  Left foot: Staples intact with no gapping or dehiscence at surgical sites, open amputation stump wound at 1st ray that measures 5x3x1cm with distal 1st met bone exposed, mild pain at medial ankle at area of staples with localized erythema and edema, No pain with calf compression.   Assessment and Plan:  Problem List Items Addressed This Visit   None Visit Diagnoses     Delayed surgical wound healing of foot amputation stump ([place])    -  Primary   Right foot ulcer, with fat layer exposed ([place])       History of amputation of hallux ([place])       Uncontrolled type 2 diabetes mellitus with hyperglycemia ([place])       Fissure in skin           -Patient seen and evaluated -Applied medihoney to medial ankle and PRISMA to amputation stump  and triple antibiotic onitiment to heel covered with dry sterile dressing to surgical site left foot secured with ACE wrap and stockinet  -Advised patient to make sure to keep dressings clean, dry, and intact allowing son to help with changing dressing every other day; advised patient that we have been unable to find a home nursing agency that accepts her insurance however we are still working to see if we can get someone approved; Patient from wound vac therapy to help heal her open amputation stump wound -Advised patient to continue with post-op shoe on left and to limit activity or  excessive walking or standing like before with use of rolling walker with seat -Awaiting biologic IVR from [facility]  -Will plan for xrays, staple removal and wound care at next office visit.   Landis Martins, DPM

## 2021-11-07 ENCOUNTER — Ambulatory Visit (INDEPENDENT_AMBULATORY_CARE_PROVIDER_SITE_OTHER): Payer: Managed Care, Other (non HMO)

## 2021-11-07 ENCOUNTER — Encounter: Payer: Self-pay | Admitting: Sports Medicine

## 2021-11-07 ENCOUNTER — Ambulatory Visit (INDEPENDENT_AMBULATORY_CARE_PROVIDER_SITE_OTHER): Payer: Managed Care, Other (non HMO) | Admitting: Sports Medicine

## 2021-11-07 DIAGNOSIS — Z89419 Acquired absence of unspecified great toe: Secondary | ICD-10-CM

## 2021-11-07 DIAGNOSIS — T8789 Other complications of amputation stump: Secondary | ICD-10-CM

## 2021-11-07 DIAGNOSIS — T8189XA Other complications of procedures, not elsewhere classified, initial encounter: Secondary | ICD-10-CM

## 2021-11-07 DIAGNOSIS — L97512 Non-pressure chronic ulcer of other part of right foot with fat layer exposed: Secondary | ICD-10-CM

## 2021-11-07 NOTE — Progress Notes (Signed)
Subjective: Jasmine Richardson is a 60 y.o. female patient seen today in office for POV # 4 DOS 10/10/21, S/P ID of amputation stump site left foot. Patient reports that she is doing okay husband has noticed a little bit of redness but thinks it may be from her doing somewhat walking and standing on yesterday.  Patient is assisted by husband at this visit.  Patient Active Problem List   Diagnosis Date Noted   Abnormal stress test 10/23/2021   Abnormal EKG 10/23/2021   AKI (acute kidney injury) ([place]) 10/23/2021   Anxiety 10/23/2021   Cellulitis of right breast 10/23/2021   Gangrene ([place]) 10/23/2021   Hematoma 10/23/2021   Hiatal hernia 10/23/2021   Hyperglycemia 10/23/2021   Hyperglycemia due to type 2 diabetes mellitus ([place]) 10/23/2021   Hyponatremia 10/23/2021   Infection of great toe 10/23/2021   Lactic acidosis 10/23/2021   Leukocytosis 10/23/2021   Obesity (BMI 30-39.9) 10/23/2021   Perineal abscess 10/23/2021   Cellulitis, wound, post-operative 10/09/2021   Abnormal mammogram of left breast 09/04/2021   Breast wound, right, subsequent encounter 09/04/2021   Demand ischemia ([place])    ICD (implantable cardioverter-defibrillator) in place 05/16/2021   Ventricular tachycardia 12/17/2020   Unstable angina ([place]) 05/22/2020   NSTEMI (non-ST elevated myocardial infarction) ([place]) 05/22/2020   Diabetic foot ulcer ([place]) 02/16/2020   Angina pectoris ([place]) 09/11/2017   Emphysema lung ([place]) 09/11/2017   Chronic diastolic heart failure ([place]) 11/06/2016   Mediastinitis 06/28/2016   Wound, surgical, infected 06/06/2016   S/P CABG (coronary artery bypass graft) 06/03/2016   Severe sepsis ([place]) 06/03/2016   GERD (gastroesophageal reflux disease) 05/08/2016   Morbid obesity ([place]) 05/08/2016   Type 2 diabetes mellitus with foot ulcer ([place]) 05/08/2016   Sinus tachycardia 05/08/2016   Acute chest pain 05/08/2016   Burping 05/08/2016   Tobacco use disorder 04/20/2016   Coronary artery disease  involving native coronary artery of native heart with angina pectoris ([place]) 09/12/2015   Essential hypertension 09/12/2015   Hyperlipidemia 09/12/2015    Current Outpatient Medications on File Prior to Visit  Medication Sig Dispense Refill   acetaminophen (TYLENOL) 325 MG tablet Take 2 tablets (650 mg total) by mouth every 4 (four) hours as needed for headache or mild pain.     amiodarone (PACERONE) 200 MG tablet Take 1 tablet (200 mg total) by mouth daily. 90 tablet 0   cholecalciferol (VITAMIN D3) 25 MCG (1000 UNIT) tablet Take 1,000 Units by mouth 2 (two) times a week.     cyanocobalamin 1000 MCG tablet Take 1,000 mcg by mouth daily.     Evolocumab (REPATHA SURECLICK) 161 MG/ML SOAJ Inject 140 mg into the skin every 14 (fourteen) days. 2 mL 11   fenofibrate (TRICOR) 145 MG tablet Take 1 tablet (145 mg total) by mouth daily. 90 tablet 0   furosemide (LASIX) 20 MG tablet Take 20 mg by mouth daily.     HUMALOG KWIKPEN 100 UNIT/ML KwikPen Inject 10-50 Units into the skin at bedtime. Sliding scale     isosorbide mononitrate (IMDUR) 30 MG 24 hr tablet Take 1 tablet (30 mg total) by mouth daily. 30 tablet 0   metFORMIN (GLUCOPHAGE) 500 MG tablet Take 1 tablet (500 mg total) by mouth 2 (two) times daily. 180 tablet 0   metoprolol succinate (TOPROL XL) 25 MG 24 hr tablet Take 1 tablet (25 mg total) by mouth 3 (three) times daily. 90 tablet 0   nitroGLYCERIN (NITROSTAT) 0.4 MG SL tablet Place 1 tablet (0.4  mg total) under the tongue every 5 (five) minutes as needed for chest pain. 270 tablet 12   nystatin (MYCOSTATIN/NYSTOP) powder Apply 1 application topically daily as needed (rash/yeast).     omeprazole (PRILOSEC) 20 MG capsule Take 1 capsule (20 mg total) by mouth daily. 90 capsule 0   rosuvastatin (CRESTOR) 20 MG tablet Take 1 tablet (20 mg total) by mouth at bedtime. 90 tablet 0   TRESIBA FLEXTOUCH 100 UNIT/ML FlexTouch Pen Inject 20 Units into the skin daily. 3 mL 1   valsartan (DIOVAN) 80 MG  tablet Take 1 tablet (80 mg total) by mouth 2 (two) times daily. 180 tablet 3   vitamin C (ASCORBIC ACID) 250 MG tablet Take 250 mg by mouth at bedtime.     No current facility-administered medications on file prior to visit.    Allergies  Allergen Reactions   Chlorhexidine Gluconate Itching    Received CHG bath, began itching, required benadryl   Cat Hair Extract Other (See Comments)    Sneezing, watery eyes.   Tramadol Other (See Comments)    Hallucinations   Vancomycin     Other reaction(s): Anaphylaxis*   Codeine Rash    Objective: There were no vitals filed for this visit.  General: No acute distress, AAOx3  Left foot: Staples intact with no gapping or dehiscence at surgical sites, open amputation stump wound at 1st ray that measures 5x3x1cm same as previous with distal 1st met bone exposed that appears to be getting some fatty and granulation tissue over the wound, mild pain at medial ankle at area of staples with localized erythema and edema, dry fissuring noted to the left posterior heel, no pain with calf compression.   X-rays consistent with postoperative status and transmetatarsal amputation to the level of the first metatarsal  Assessment and Plan:  Problem List Items Addressed This Visit   None Visit Diagnoses     History of amputation of hallux ([place])    -  Primary   Relevant Orders   DG Foot Complete Left   Delayed surgical wound healing of foot amputation stump ([place])       Relevant Orders   DG Foot Complete Left   Right foot ulcer, with fat layer exposed ([place])       Relevant Orders   DG Foot Complete Left       -Patient seen and evaluated -Staples were removed -Surgical wound was debrided using pickups and tissue nipper to healthy bleeding tissue and the amputation stump wound bed -Applied medihoney and PRISMA to amputation stump  and Medihoney to heel covered with dry sterile dressing to surgical site left foot secured with ACE wrap and stockinet   -Advised patient to make sure to keep dressings clean, dry, and intact allowing son to help with changing dressing every other day; advised patient that we have been unable to find a home nursing agency that accepts her insurance however we are still working to see if we can get someone approved; Patient from wound vac therapy to help heal her open amputation stump wound; awaiting social worker from her insurance company to get back to [place] with a wound company that will accept her insurance so far we have not been able to find anyone that can do this type of advance dressing -Advised patient to continue with post-op shoe on left and to limit activity or excessive walking or standing like before with use of rolling walker with seat -Awaiting biologic IVR from [facility]; Still have not  heard back regarding this at this time -Will plan for follow-up wound care at next office visit.   Landis Martins, DPM

## 2021-11-08 ENCOUNTER — Encounter: Payer: Managed Care, Other (non HMO) | Admitting: Sports Medicine

## 2021-11-13 ENCOUNTER — Telehealth: Payer: Self-pay

## 2021-11-13 NOTE — Telephone Encounter (Signed)
Pt called stating she contacted her insurance to ask why they were not covering for home care services or wound vac services. Pt was told that her Dr has to call [phone] to get authorization for out of network services.

## 2021-11-14 ENCOUNTER — Other Ambulatory Visit: Payer: Self-pay

## 2021-11-14 ENCOUNTER — Ambulatory Visit (HOSPITAL_COMMUNITY)
Admission: RE | Admit: 2021-11-14 | Discharge: 2021-11-14 | Disposition: A | Discharge status: Home / Self Care | Payer: Managed Care, Other (non HMO) | Source: Ambulatory Visit | Attending: Vascular Surgery | Admitting: Vascular Surgery

## 2021-11-14 ENCOUNTER — Ambulatory Visit (INDEPENDENT_AMBULATORY_CARE_PROVIDER_SITE_OTHER)
Admit: 2021-11-14 | Discharge: 2021-11-14 | Disposition: A | Discharge status: Home / Self Care | Payer: Managed Care, Other (non HMO) | Attending: Vascular Surgery | Admitting: Vascular Surgery

## 2021-11-14 ENCOUNTER — Ambulatory Visit (INDEPENDENT_AMBULATORY_CARE_PROVIDER_SITE_OTHER): Payer: Managed Care, Other (non HMO) | Admitting: Physician Assistant

## 2021-11-14 VITALS — BP 160/85 | HR 80 | Temp 97.9°F | Resp 20 | Ht 67.0 in | Wt 242.5 lb

## 2021-11-14 DIAGNOSIS — I739 Peripheral vascular disease, unspecified: Secondary | ICD-10-CM

## 2021-11-14 NOTE — Telephone Encounter (Signed)
We will fax  home visit authorization sheet to [facility]

## 2021-11-15 ENCOUNTER — Encounter: Payer: Managed Care, Other (non HMO) | Admitting: Sports Medicine

## 2021-11-15 ENCOUNTER — Encounter: Payer: Self-pay | Admitting: Physician Assistant

## 2021-11-15 NOTE — Progress Notes (Signed)
Office Note     CC:  follow up Requesting Provider:  Nicholos Johns, MD  HPI: Jasmine Richardson is a 60 y.o. ([birth year]) female who presents status post aortogram with left lower extremity arteriogram and anterior tibial artery angioplasty by Dr. Carlis Abbott on 10/11/2021 due to critical limb ischemia with tissue loss.  Prior to that she underwent great toe ray amputation by Dr. Cannon Kettle of podiatry.  She is performing wet-to-dry dressing changes and sees her podiatrist on a weekly basis.  She believes the wound is showing signs of healing.  She and her husband are here today and they are frustrated that they have not yet been able to connect with home health for wound VAC placement.  They are hopeful this will happen in the next week or 2.  She is on aspirin, Plavix, and statin daily.  She is heel weightbearing.  She denies fevers, chills, nausea/vomiting.  Past Medical History:  Diagnosis Date   Acute chest pain 05/08/2016   Angina pectoris ([place]) 09/11/2017   Burping 05/08/2016   Chronic diastolic heart failure ([place]) 11/06/2016   Coronary artery disease involving native coronary artery of native heart with angina pectoris ([place]) 09/12/2015   Overview:  PCI and stent of RCA 2009, last cath 2012 with medical therapy  CABG May 2017   Emphysema lung ([place]) 09/11/2017   Essential hypertension 09/12/2015   GERD (gastroesophageal reflux disease) 05/08/2016   Hyperlipidemia 09/12/2015   Mediastinitis 06/28/2016   Morbid obesity ([place]) 05/08/2016   S/P CABG (coronary artery bypass graft) 06/03/2016   Overview:  The patient underwent sternal reconstruction on 06/21/16 with pec flaps for mediastinitis from a prior CABG in May 2017. On admission, she was critically ill from sepsis and had altered mental status. She was last seen in clinic on 08/09/16 at which time she was doing well.   Severe sepsis ([place]) 06/03/2016   Sinus tachycardia 05/08/2016   Tobacco use disorder 04/20/2016   Overview:  Quit in May 2017.   Uncontrolled type  2 diabetes mellitus 05/08/2016   Wound, surgical, infected 06/06/2016   Overview:  sternal    Past Surgical History:  Procedure Laterality Date   ABDOMINAL AORTOGRAM W/LOWER EXTREMITY N/A 10/11/2021   Procedure: ABDOMINAL AORTOGRAM W/LOWER EXTREMITY;  Surgeon: Marty Heck, MD;  Location: [facility];  Service: Cardiovascular;  Laterality: N/A;   AMPUTATION TOE Right 02/20/2020   Procedure: AMPUTATION RIGHT GREAT  TOE;  Surgeon: Felipa Furnace, DPM;  Location: [facility];  Service: Podiatry;  Laterality: Right;   BLADDER SURGERY     CARDIAC CATHETERIZATION     CORONARY ARTERY BYPASS GRAFT     CORONARY STENT INTERVENTION N/A 05/25/2020   Procedure: CORONARY STENT INTERVENTION;  Surgeon: Wellington Hampshire, MD;  Location: [facility];  Service: Cardiovascular;  Laterality: N/A;   CORONARY STENT INTERVENTION N/A 06/22/2020   Procedure: CORONARY STENT INTERVENTION;  Surgeon: Leonie Man, MD;  Location: [facility];  Service: Cardiovascular;  Laterality: N/A;   ICD IMPLANT N/A 12/19/2020   Procedure: ICD IMPLANT;  Surgeon: Evans Lance, MD;  Location: [facility];  Service: Cardiovascular;  Laterality: N/A;   INCISION AND DRAINAGE Right 02/19/2020   Procedure: INCISION AND DRAINAGE;  Surgeon: Trula Slade, DPM;  Location: [facility];  Service: Podiatry;  Laterality: Right;  Block done by surgeon   INTRAVASCULAR ULTRASOUND/IVUS N/A 06/22/2020   Procedure: Intravascular Ultrasound/IVUS;  Surgeon: Leonie Man, MD;  Location: [facility];  Service: Cardiovascular;  Laterality: N/A;   IRRIGATION AND DEBRIDEMENT FOOT Left 10/10/2021   Procedure: IRRIGATION AND DEBRIDEMENT FOOT;  Surgeon: Landis Martins, DPM;  Location: [facility];  Service: Podiatry;  Laterality: Left;   LEFT HEART CATH AND CORS/GRAFTS ANGIOGRAPHY N/A 05/23/2020   Procedure: LEFT HEART CATH AND CORS/GRAFTS ANGIOGRAPHY;  Surgeon: Nelva Bush, MD;  Location: [facility];  Service:  Cardiovascular;  Laterality: N/A;   LEFT HEART CATH AND CORS/GRAFTS ANGIOGRAPHY N/A 12/19/2020   Procedure: LEFT HEART CATH AND CORS/GRAFTS ANGIOGRAPHY;  Surgeon: Burnell Blanks, MD;  Location: [facility];  Service: Cardiovascular;  Laterality: N/A;   LEFT HEART CATH AND CORS/GRAFTS ANGIOGRAPHY N/A 07/21/2021   Procedure: LEFT HEART CATH AND CORS/GRAFTS ANGIOGRAPHY;  Surgeon: Nelva Bush, MD;  Location: [facility];  Service: Cardiovascular;  Laterality: N/A;   METATARSAL HEAD EXCISION Right 05/02/2020   Procedure: FIRST METATARSAL HEAD RESECTION; RIGHT FOOT WOUND CLOSURE;  Surgeon: Landis Martins, DPM;  Location: [facility];  Service: Podiatry;  Laterality: Right;  MAC W/LOCAL   PERIPHERAL VASCULAR BALLOON ANGIOPLASTY Left 10/11/2021   Procedure: PERIPHERAL VASCULAR BALLOON ANGIOPLASTY;  Surgeon: Marty Heck, MD;  Location: [facility];  Service: Cardiovascular;  Laterality: Left;  Anterior Tibial Artery   TUBAL LIGATION      Social History   Socioeconomic History   Marital status: Married    Spouse name: Not on file   Number of children: Not on file   Years of education: Not on file   Highest education level: Not on file  Occupational History   Not on file  Tobacco Use   Smoking status: Former    Types: Cigarettes    Quit date: 05/2016    Years since quitting: 5.5   Smokeless tobacco: Never  Vaping Use   Vaping Use: Never used  Substance and Sexual Activity   Alcohol use: No   Drug use: No   Sexual activity: Yes  Other Topics Concern   Not on file  Social History Narrative   Not on file   Social Determinants of Health   Financial Resource Strain: Not on file  Food Insecurity: Not on file  Transportation Needs: Not on file  Physical Activity: Not on file  Stress: Not on file  Social Connections: Not on file  Intimate Partner Violence: Not on file    Family History  Problem Relation Age of Onset   Hypertension  Mother    Hyperlipidemia Mother    Heart attack Father    Heart disease Father    Hypertension Father    Alzheimer's disease Father    Hypertension Brother    Hyperlipidemia Brother     Current Outpatient Medications  Medication Sig Dispense Refill   acetaminophen (TYLENOL) 325 MG tablet Take 2 tablets (650 mg total) by mouth every 4 (four) hours as needed for headache or mild pain.     amiodarone (PACERONE) 200 MG tablet Take 1 tablet (200 mg total) by mouth daily. 90 tablet 0   amoxicillin-clavulanate (AUGMENTIN) 875-125 MG tablet TAKE 1 TABLET BY MOUTH EVERY 12 HOURS FOR 8 DAYS     aspirin EC 81 MG tablet Take 81 mg by mouth daily. Swallow whole.     cholecalciferol (VITAMIN D3) 25 MCG (1000 UNIT) tablet Take 1,000 Units by mouth 2 (two) times a week.     clopidogrel (PLAVIX) 75 MG tablet Take 75 mg by mouth daily.     cyanocobalamin  1000 MCG tablet Take 1,000 mcg by mouth daily.     Evolocumab (REPATHA SURECLICK) 491 MG/ML SOAJ Inject 140 mg into the skin every 14 (fourteen) days. 2 mL 11   fenofibrate (TRICOR) 145 MG tablet Take 1 tablet (145 mg total) by mouth daily. 90 tablet 0   furosemide (LASIX) 20 MG tablet Take 20 mg by mouth daily.     HUMALOG KWIKPEN 100 UNIT/ML KwikPen Inject 10-50 Units into the skin at bedtime. Sliding scale     TRESIBA FLEXTOUCH 100 UNIT/ML FlexTouch Pen Inject 20 Units into the skin daily. 3 mL 1   valsartan (DIOVAN) 80 MG tablet Take 1 tablet (80 mg total) by mouth 2 (two) times daily. 180 tablet 3   vitamin C (ASCORBIC ACID) 250 MG tablet Take 250 mg by mouth at bedtime.     isosorbide mononitrate (IMDUR) 30 MG 24 hr tablet Take 1 tablet (30 mg total) by mouth daily. 30 tablet 0   metFORMIN (GLUCOPHAGE) 500 MG tablet Take 1 tablet (500 mg total) by mouth 2 (two) times daily. 180 tablet 0   metoprolol succinate (TOPROL XL) 25 MG 24 hr tablet Take 1 tablet (25 mg total) by mouth 3 (three) times daily. 90 tablet 0   nitroGLYCERIN (NITROSTAT) 0.4 MG SL  tablet Place 1 tablet (0.4 mg total) under the tongue every 5 (five) minutes as needed for chest pain. 270 tablet 12   nystatin (MYCOSTATIN/NYSTOP) powder Apply 1 application topically daily as needed (rash/yeast).     rosuvastatin (CRESTOR) 20 MG tablet Take 1 tablet (20 mg total) by mouth at bedtime. 90 tablet 0   No current facility-administered medications for this visit.    Allergies  Allergen Reactions   Chlorhexidine Gluconate Itching    Received CHG bath, began itching, required benadryl   Cat Hair Extract Other (See Comments)    Sneezing, watery eyes.   Tramadol Other (See Comments)    Hallucinations   Vancomycin     Other reaction(s): Anaphylaxis*   Codeine Rash     REVIEW OF SYSTEMS:   _0  denotes positive finding, _1  denotes negative finding Cardiac  Comments:  Chest pain or chest pressure:    Shortness of breath upon exertion:    Short of breath when lying flat:    Irregular heart rhythm:        Vascular    Pain in calf, thigh, or hip brought on by ambulation:    Pain in feet at night that wakes you up from your sleep:     Blood clot in your veins:    Leg swelling:         Pulmonary    Oxygen at home:    Productive cough:     Wheezing:         Neurologic    Sudden weakness in arms or legs:     Sudden numbness in arms or legs:     Sudden onset of difficulty speaking or slurred speech:    Temporary loss of vision in one eye:     Problems with dizziness:         Gastrointestinal    Blood in stool:     Vomited blood:         Genitourinary    Burning when urinating:     Blood in urine:        Psychiatric    Major depression:         Hematologic    Bleeding problems:  Problems with blood clotting too easily:        Skin    Rashes or ulcers:        Constitutional    Fever or chills:      PHYSICAL EXAMINATION:  Vitals:   11/14/21 1513  BP: (!) 160/85  Pulse: 80  Resp: 20  Temp: 97.9 F (36.6 C)  TempSrc: Temporal  SpO2: 99%   Weight: 242 lb 8 oz (110 kg)  Height: _0  (1.702 m)    General:  WDWN in NAD; vital signs documented above Gait: Not observed HENT: WNL, normocephalic Pulmonary: normal non-labored breathing Cardiac: regular HR Abdomen: soft, NT, no masses Skin: without rashes Vascular Exam/Pulses: Brisk ATA signal by Doppler Extremities: Packing left in place left great toe amp site Musculoskeletal: no muscle wasting or atrophy  Neurologic: A&O X 3;  No focal weakness or paresthesias are detected Psychiatric:  The pt has Normal affect.   Non-Invasive Vascular Imaging:   Arterial duplex demonstrates a patent and triphasic proximal anterior tibial artery as well as monophasic flow distally Right ABI 0.8 left ABI 1.07    ASSESSMENT/PLAN:: 60 y.o. female status post aortogram with left lower extremity arteriogram and anterior tibial artery angioplasty due to critical limb ischemia with tissue loss  -Left lower extremity arterial duplex demonstrates a widely patent anterior tibial artery after intervention -Subjectively patient believes the wound is healing well and has plans with home health for eventual wound VAC placement -I encouraged her to continue aspirin, Plavix, statin daily -Continue heel weightbearing only and regular follow-up with podiatry -Vascular will be available as needed for wound care otherwise we will repeat imaging studies in 3 months   Dagoberto Ligas, PA-C [facility] [phone]  Clinic MD:   Carlis Abbott

## 2021-11-17 ENCOUNTER — Ambulatory Visit (INDEPENDENT_AMBULATORY_CARE_PROVIDER_SITE_OTHER): Payer: Managed Care, Other (non HMO) | Admitting: Sports Medicine

## 2021-11-17 ENCOUNTER — Other Ambulatory Visit: Payer: Self-pay

## 2021-11-17 ENCOUNTER — Encounter: Payer: Self-pay | Admitting: Sports Medicine

## 2021-11-17 DIAGNOSIS — T8789 Other complications of amputation stump: Secondary | ICD-10-CM

## 2021-11-17 DIAGNOSIS — Z9889 Other specified postprocedural states: Secondary | ICD-10-CM

## 2021-11-17 DIAGNOSIS — L97512 Non-pressure chronic ulcer of other part of right foot with fat layer exposed: Secondary | ICD-10-CM

## 2021-11-17 DIAGNOSIS — Z89419 Acquired absence of unspecified great toe: Secondary | ICD-10-CM

## 2021-11-17 DIAGNOSIS — T8189XA Other complications of procedures, not elsewhere classified, initial encounter: Secondary | ICD-10-CM

## 2021-11-17 DIAGNOSIS — L97522 Non-pressure chronic ulcer of other part of left foot with fat layer exposed: Secondary | ICD-10-CM

## 2021-11-17 DIAGNOSIS — I739 Peripheral vascular disease, unspecified: Secondary | ICD-10-CM

## 2021-11-17 DIAGNOSIS — E1165 Type 2 diabetes mellitus with hyperglycemia: Secondary | ICD-10-CM

## 2021-11-17 NOTE — Progress Notes (Addendum)
Subjective: Jasmine Richardson is a 60 y.o. female patient seen today in office for POV # 5 DOS 10/10/21, S/P I&D of amputation stump site left foot. Patient reports that she is doing okay has a small crease underneath her second toe that she wants me to look at.  Patient also reports that she went to the vascular doctor and they said that her circulation was doing much better.  Patient denies any other pedal complaints or constitutional symptoms at this time.  Patient is assisted by son at this visit.  Patient Active Problem List   Diagnosis Date Noted   Abnormal stress test 10/23/2021   Abnormal EKG 10/23/2021   AKI (acute kidney injury) ([place]) 10/23/2021   Anxiety 10/23/2021   Cellulitis of right breast 10/23/2021   Gangrene ([place]) 10/23/2021   Hematoma 10/23/2021   Hiatal hernia 10/23/2021   Hyperglycemia 10/23/2021   Hyperglycemia due to type 2 diabetes mellitus ([place]) 10/23/2021   Hyponatremia 10/23/2021   Infection of great toe 10/23/2021   Lactic acidosis 10/23/2021   Leukocytosis 10/23/2021   Obesity (BMI 30-39.9) 10/23/2021   Perineal abscess 10/23/2021   Cellulitis, wound, post-operative 10/09/2021   Abnormal mammogram of left breast 09/04/2021   Breast wound, right, subsequent encounter 09/04/2021   Demand ischemia ([place])    ICD (implantable cardioverter-defibrillator) in place 05/16/2021   Ventricular tachycardia 12/17/2020   Unstable angina ([place]) 05/22/2020   NSTEMI (non-ST elevated myocardial infarction) ([place]) 05/22/2020   Diabetic foot ulcer ([place]) 02/16/2020   Angina pectoris ([place]) 09/11/2017   Emphysema lung ([place]) 09/11/2017   Chronic diastolic heart failure ([place]) 11/06/2016   Mediastinitis 06/28/2016   Wound, surgical, infected 06/06/2016   S/P CABG (coronary artery bypass graft) 06/03/2016   Severe sepsis ([place]) 06/03/2016   GERD (gastroesophageal reflux disease) 05/08/2016   Morbid obesity ([place]) 05/08/2016   Type 2 diabetes mellitus with foot ulcer ([place]) 05/08/2016    Sinus tachycardia 05/08/2016   Acute chest pain 05/08/2016   Burping 05/08/2016   Tobacco use disorder 04/20/2016   Coronary artery disease involving native coronary artery of native heart with angina pectoris ([place]) 09/12/2015   Essential hypertension 09/12/2015   Hyperlipidemia 09/12/2015    Current Outpatient Medications on File Prior to Visit  Medication Sig Dispense Refill   acetaminophen (TYLENOL) 325 MG tablet Take 2 tablets (650 mg total) by mouth every 4 (four) hours as needed for headache or mild pain.     amiodarone (PACERONE) 200 MG tablet Take 1 tablet (200 mg total) by mouth daily. 90 tablet 0   amoxicillin-clavulanate (AUGMENTIN) 875-125 MG tablet TAKE 1 TABLET BY MOUTH EVERY 12 HOURS FOR 8 DAYS     aspirin EC 81 MG tablet Take 81 mg by mouth daily. Swallow whole.     cholecalciferol (VITAMIN D3) 25 MCG (1000 UNIT) tablet Take 1,000 Units by mouth 2 (two) times a week.     clopidogrel (PLAVIX) 75 MG tablet Take 75 mg by mouth daily.     cyanocobalamin 1000 MCG tablet Take 1,000 mcg by mouth daily.     Evolocumab (REPATHA SURECLICK) 086 MG/ML SOAJ Inject 140 mg into the skin every 14 (fourteen) days. 2 mL 11   fenofibrate (TRICOR) 145 MG tablet Take 1 tablet (145 mg total) by mouth daily. 90 tablet 0   furosemide (LASIX) 20 MG tablet Take 20 mg by mouth daily.     HUMALOG KWIKPEN 100 UNIT/ML KwikPen Inject 10-50 Units into the skin at bedtime. Sliding scale     isosorbide  mononitrate (IMDUR) 30 MG 24 hr tablet Take 1 tablet (30 mg total) by mouth daily. 30 tablet 0   metFORMIN (GLUCOPHAGE) 500 MG tablet Take 1 tablet (500 mg total) by mouth 2 (two) times daily. 180 tablet 0   metoprolol succinate (TOPROL XL) 25 MG 24 hr tablet Take 1 tablet (25 mg total) by mouth 3 (three) times daily. 90 tablet 0   nitroGLYCERIN (NITROSTAT) 0.4 MG SL tablet Place 1 tablet (0.4 mg total) under the tongue every 5 (five) minutes as needed for chest pain. 270 tablet 12   nystatin  (MYCOSTATIN/NYSTOP) powder Apply 1 application topically daily as needed (rash/yeast).     rosuvastatin (CRESTOR) 20 MG tablet Take 1 tablet (20 mg total) by mouth at bedtime. 90 tablet 0   TRESIBA FLEXTOUCH 100 UNIT/ML FlexTouch Pen Inject 20 Units into the skin daily. 3 mL 1   valsartan (DIOVAN) 80 MG tablet Take 1 tablet (80 mg total) by mouth 2 (two) times daily. 180 tablet 3   vitamin C (ASCORBIC ACID) 250 MG tablet Take 250 mg by mouth at bedtime.     No current facility-administered medications on file prior to visit.    Allergies  Allergen Reactions   Chlorhexidine Gluconate Itching    Received CHG bath, began itching, required benadryl   Cat Hair Extract Other (See Comments)    Sneezing, watery eyes.   Tramadol Other (See Comments)    Hallucinations   Vancomycin     Other reaction(s): Anaphylaxis*   Codeine Rash    Objective: There were no vitals filed for this visit.  General: No acute distress, AAOx3  Left foot: Open amputation stump wound at 1st ray that measures 5x4x1cm with distal 1st met bone exposed that appears to be getting more fatty and granulation tissue over the wound, decreased pain at medial ankle surgical site, dry fissuring noted to the left posterior heel and to the sulcus underneath the left second toe, no acute signs or symptoms of infection at this time at the toe, no pain with calf compression.    Assessment and Plan:  Problem List Items Addressed This Visit   None Visit Diagnoses     Foot ulcer, left, with fat layer exposed ([place])    -  Primary   Delayed surgical wound healing of foot amputation stump ([place])       History of amputation of hallux ([place])       Uncontrolled type 2 diabetes mellitus with hyperglycemia ([facility])       S/P foot surgery, left            -Patient seen and evaluated -Mechanically debrided periwound area of the open amputation stump site using a tissue nipper to healthy bleeding margins without incident -Applied medihoney  and PRISMA to amputation stump  and Medihoney to heel and sulcus underneath the left second toe covered with dry sterile dressing to surgical site left foot secured with ACE wrap and stockinet  -Advised patient to make sure to keep dressings clean, dry, and intact allowing son or husband to help with family changing dressing every other day -Discussed with patient need for wound VAC therapy and patient and son feels like they are capable to be able to change the wound VAC dressing himself since we cannot find a home nurse/home health agency to come to their home due to insurance; [employer] contacted to order wound VAC and spoke with [place] patient to continue with post-op shoe on left and to limit activity  or excessive walking or standing like before with use of rolling walker with seat and advised patient that she cannot do exercise or ride a stationary bike at this time but can do some gentle stretching when she is sitting -IVR for organanogensis for graft was denied; awaiting opportunity to do Peer to peer  -Will plan for follow-up wound care at next office visit.   Landis Martins, DPM

## 2021-11-21 ENCOUNTER — Emergency Department (HOSPITAL_COMMUNITY): Payer: Managed Care, Other (non HMO)

## 2021-11-21 ENCOUNTER — Other Ambulatory Visit: Payer: Self-pay

## 2021-11-21 ENCOUNTER — Telehealth: Payer: Self-pay | Admitting: Sports Medicine

## 2021-11-21 ENCOUNTER — Encounter (HOSPITAL_COMMUNITY): Payer: Self-pay

## 2021-11-21 ENCOUNTER — Inpatient Hospital Stay (HOSPITAL_COMMUNITY)
Admission: EM | Admit: 2021-11-21 | Discharge: 2021-11-28 | DRG: 854 | Disposition: A | Discharge status: Home Health | Payer: Managed Care, Other (non HMO) | Attending: Internal Medicine | Admitting: Internal Medicine

## 2021-11-21 ENCOUNTER — Other Ambulatory Visit: Payer: Self-pay | Admitting: Sports Medicine

## 2021-11-21 DIAGNOSIS — R7881 Bacteremia: Secondary | ICD-10-CM | POA: Diagnosis present

## 2021-11-21 DIAGNOSIS — E1152 Type 2 diabetes mellitus with diabetic peripheral angiopathy with gangrene: Secondary | ICD-10-CM | POA: Diagnosis present

## 2021-11-21 DIAGNOSIS — I5042 Chronic combined systolic (congestive) and diastolic (congestive) heart failure: Secondary | ICD-10-CM | POA: Diagnosis present

## 2021-11-21 DIAGNOSIS — J439 Emphysema, unspecified: Secondary | ICD-10-CM | POA: Diagnosis present

## 2021-11-21 DIAGNOSIS — Z820 Family history of epilepsy and other diseases of the nervous system: Secondary | ICD-10-CM

## 2021-11-21 DIAGNOSIS — A4102 Sepsis due to Methicillin resistant Staphylococcus aureus: Principal | ICD-10-CM | POA: Diagnosis present

## 2021-11-21 DIAGNOSIS — E1165 Type 2 diabetes mellitus with hyperglycemia: Secondary | ICD-10-CM | POA: Diagnosis present

## 2021-11-21 DIAGNOSIS — Z7984 Long term (current) use of oral hypoglycemic drugs: Secondary | ICD-10-CM

## 2021-11-21 DIAGNOSIS — Z90710 Acquired absence of both cervix and uterus: Secondary | ICD-10-CM

## 2021-11-21 DIAGNOSIS — I25119 Atherosclerotic heart disease of native coronary artery with unspecified angina pectoris: Secondary | ICD-10-CM | POA: Diagnosis present

## 2021-11-21 DIAGNOSIS — L089 Local infection of the skin and subcutaneous tissue, unspecified: Secondary | ICD-10-CM

## 2021-11-21 DIAGNOSIS — Z885 Allergy status to narcotic agent status: Secondary | ICD-10-CM

## 2021-11-21 DIAGNOSIS — I10 Essential (primary) hypertension: Secondary | ICD-10-CM | POA: Diagnosis not present

## 2021-11-21 DIAGNOSIS — K219 Gastro-esophageal reflux disease without esophagitis: Secondary | ICD-10-CM | POA: Diagnosis present

## 2021-11-21 DIAGNOSIS — E11628 Type 2 diabetes mellitus with other skin complications: Secondary | ICD-10-CM | POA: Diagnosis present

## 2021-11-21 DIAGNOSIS — L03032 Cellulitis of left toe: Secondary | ICD-10-CM | POA: Diagnosis present

## 2021-11-21 DIAGNOSIS — Z87891 Personal history of nicotine dependence: Secondary | ICD-10-CM

## 2021-11-21 DIAGNOSIS — Z955 Presence of coronary angioplasty implant and graft: Secondary | ICD-10-CM

## 2021-11-21 DIAGNOSIS — Z95810 Presence of automatic (implantable) cardiac defibrillator: Secondary | ICD-10-CM | POA: Diagnosis present

## 2021-11-21 DIAGNOSIS — M84675A Pathological fracture in other disease, left foot, initial encounter for fracture: Secondary | ICD-10-CM | POA: Diagnosis present

## 2021-11-21 DIAGNOSIS — L0390 Cellulitis, unspecified: Secondary | ICD-10-CM

## 2021-11-21 DIAGNOSIS — Z79899 Other long term (current) drug therapy: Secondary | ICD-10-CM

## 2021-11-21 DIAGNOSIS — I110 Hypertensive heart disease with heart failure: Secondary | ICD-10-CM | POA: Diagnosis present

## 2021-11-21 DIAGNOSIS — I252 Old myocardial infarction: Secondary | ICD-10-CM

## 2021-11-21 DIAGNOSIS — Z7982 Long term (current) use of aspirin: Secondary | ICD-10-CM

## 2021-11-21 DIAGNOSIS — E669 Obesity, unspecified: Secondary | ICD-10-CM | POA: Diagnosis present

## 2021-11-21 DIAGNOSIS — Z83438 Family history of other disorder of lipoprotein metabolism and other lipidemia: Secondary | ICD-10-CM

## 2021-11-21 DIAGNOSIS — M86172 Other acute osteomyelitis, left ankle and foot: Secondary | ICD-10-CM | POA: Diagnosis present

## 2021-11-21 DIAGNOSIS — Z20822 Contact with and (suspected) exposure to covid-19: Secondary | ICD-10-CM | POA: Diagnosis present

## 2021-11-21 DIAGNOSIS — Z888 Allergy status to other drugs, medicaments and biological substances status: Secondary | ICD-10-CM

## 2021-11-21 DIAGNOSIS — R8281 Pyuria: Secondary | ICD-10-CM | POA: Diagnosis not present

## 2021-11-21 DIAGNOSIS — Z6836 Body mass index (BMI) 36.0-36.9, adult: Secondary | ICD-10-CM

## 2021-11-21 DIAGNOSIS — Z8249 Family history of ischemic heart disease and other diseases of the circulatory system: Secondary | ICD-10-CM

## 2021-11-21 DIAGNOSIS — L539 Erythematous condition, unspecified: Secondary | ICD-10-CM | POA: Diagnosis present

## 2021-11-21 DIAGNOSIS — Z951 Presence of aortocoronary bypass graft: Secondary | ICD-10-CM

## 2021-11-21 DIAGNOSIS — Z7902 Long term (current) use of antithrombotics/antiplatelets: Secondary | ICD-10-CM

## 2021-11-21 DIAGNOSIS — I472 Ventricular tachycardia, unspecified: Secondary | ICD-10-CM | POA: Diagnosis present

## 2021-11-21 DIAGNOSIS — Z881 Allergy status to other antibiotic agents status: Secondary | ICD-10-CM

## 2021-11-21 DIAGNOSIS — E1169 Type 2 diabetes mellitus with other specified complication: Secondary | ICD-10-CM | POA: Diagnosis present

## 2021-11-21 DIAGNOSIS — I2510 Atherosclerotic heart disease of native coronary artery without angina pectoris: Secondary | ICD-10-CM | POA: Diagnosis present

## 2021-11-21 DIAGNOSIS — Z794 Long term (current) use of insulin: Secondary | ICD-10-CM

## 2021-11-21 DIAGNOSIS — I5032 Chronic diastolic (congestive) heart failure: Secondary | ICD-10-CM | POA: Diagnosis present

## 2021-11-21 DIAGNOSIS — T8140XA Infection following a procedure, unspecified, initial encounter: Secondary | ICD-10-CM

## 2021-11-21 DIAGNOSIS — M609 Myositis, unspecified: Secondary | ICD-10-CM | POA: Diagnosis present

## 2021-11-21 DIAGNOSIS — D62 Acute posthemorrhagic anemia: Secondary | ICD-10-CM | POA: Diagnosis not present

## 2021-11-21 HISTORY — DX: Local infection of the skin and subcutaneous tissue, unspecified: L08.9

## 2021-11-21 HISTORY — DX: Acute myocardial infarction, unspecified: I21.9

## 2021-11-21 HISTORY — DX: Peripheral vascular disease, unspecified: I73.9

## 2021-11-21 HISTORY — DX: Malignant (primary) neoplasm, unspecified: C80.1

## 2021-11-21 LAB — CBC WITH DIFFERENTIAL/PLATELET
Abs Immature Granulocytes: 0.12 10*3/uL — ABNORMAL HIGH (ref 0.00–0.07)
Basophils Absolute: 0.1 10*3/uL (ref 0.0–0.1)
Basophils Relative: 0 %
Eosinophils Absolute: 0 10*3/uL (ref 0.0–0.5)
Eosinophils Relative: 0 %
HCT: 34.8 % — ABNORMAL LOW (ref 36.0–46.0)
Hemoglobin: 10.7 g/dL — ABNORMAL LOW (ref 12.0–15.0)
Immature Granulocytes: 1 %
Lymphocytes Relative: 8 %
Lymphs Abs: 1.4 10*3/uL (ref 0.7–4.0)
MCH: 26.5 pg (ref 26.0–34.0)
MCHC: 30.7 g/dL (ref 30.0–36.0)
MCV: 86.1 fL (ref 80.0–100.0)
Monocytes Absolute: 1.2 10*3/uL — ABNORMAL HIGH (ref 0.1–1.0)
Monocytes Relative: 7 %
Neutro Abs: 15.3 10*3/uL — ABNORMAL HIGH (ref 1.7–7.7)
Neutrophils Relative %: 84 %
Platelets: 475 10*3/uL — ABNORMAL HIGH (ref 150–400)
RBC: 4.04 MIL/uL (ref 3.87–5.11)
RDW: 14.6 % (ref 11.5–15.5)
WBC: 18.1 10*3/uL — ABNORMAL HIGH (ref 4.0–10.5)
nRBC: 0 % (ref 0.0–0.2)

## 2021-11-21 LAB — COMPREHENSIVE METABOLIC PANEL
ALT: 19 U/L (ref 0–44)
AST: 21 U/L (ref 15–41)
Albumin: 3.1 g/dL — ABNORMAL LOW (ref 3.5–5.0)
Alkaline Phosphatase: 54 U/L (ref 38–126)
Anion gap: 10 (ref 5–15)
BUN: 16 mg/dL (ref 8–23)
CO2: 25 mmol/L (ref 22–32)
Calcium: 9.7 mg/dL (ref 8.9–10.3)
Chloride: 98 mmol/L (ref 98–111)
Creatinine, Ser: 0.96 mg/dL (ref 0.44–1.00)
GFR, Estimated: >60 mL/min (ref >60)
Glucose, Bld: 272 mg/dL — ABNORMAL HIGH (ref 70–99)
Potassium: 4.5 mmol/L (ref 3.5–5.1)
Sodium: 133 mmol/L — ABNORMAL LOW (ref 135–145)
Total Bilirubin: 0.7 mg/dL (ref 0.3–1.2)
Total Protein: 8.2 g/dL — ABNORMAL HIGH (ref 6.5–8.1)

## 2021-11-21 LAB — RESP PANEL BY RT-PCR (FLU A&B, COVID) ARPGX2
Influenza A by PCR: NEGATIVE
Influenza B by PCR: NEGATIVE
SARS Coronavirus 2 by RT PCR: NEGATIVE

## 2021-11-21 LAB — LACTIC ACID, PLASMA: Lactic Acid, Venous: 1.7 mmol/L (ref 0.5–1.9)

## 2021-11-21 LAB — LIPASE, BLOOD: Lipase: 25 U/L (ref 11–51)

## 2021-11-21 MED ORDER — INSULIN ASPART 100 UNIT/ML IJ SOLN
0.0000 [IU] | Freq: Every day | INTRAMUSCULAR | Status: DC
  Administered 2021-11-22: 01:00:00 3 [IU] via SUBCUTANEOUS
  Administered 2021-11-23: 22:00:00 2 [IU] via SUBCUTANEOUS
  Administered 2021-11-24: 4 [IU] via SUBCUTANEOUS
  Administered 2021-11-25 – 2021-11-27 (×3): 2 [IU] via SUBCUTANEOUS

## 2021-11-21 MED ORDER — INSULIN GLARGINE-YFGN 100 UNIT/ML ~~LOC~~ SOLN
30.0000 [IU] | Freq: Every day | SUBCUTANEOUS | Status: DC
  Administered 2021-11-22 – 2021-11-24 (×3): 30 [IU] via SUBCUTANEOUS
  Filled 2021-11-21 (×3): qty 0.3

## 2021-11-21 MED ORDER — ENOXAPARIN SODIUM 40 MG/0.4ML IJ SOSY: 40.0000 mg | PREFILLED_SYRINGE | INTRAMUSCULAR | Status: DC

## 2021-11-21 MED ORDER — POLYETHYLENE GLYCOL 3350 17 G PO PACK: 17.0000 g | PACK | Freq: Every day | ORAL | Status: DC | PRN

## 2021-11-21 MED ORDER — HYDROMORPHONE HCL 2 MG PO TABS: 2.0000 mg | ORAL_TABLET | Freq: Four times a day (QID) | ORAL | 0 refills | Status: DC | PRN

## 2021-11-21 MED ORDER — ACETAMINOPHEN 325 MG PO TABS
650.0000 mg | ORAL_TABLET | Freq: Once | ORAL | Status: AC | PRN
  Administered 2021-11-21: 650 mg via ORAL
  Filled 2021-11-21: qty 2

## 2021-11-21 MED ORDER — PIPERACILLIN-TAZOBACTAM 3.375 G IVPB
3.3750 g | Freq: Three times a day (TID) | INTRAVENOUS | Status: DC
  Administered 2021-11-22: 3.375 g via INTRAVENOUS
  Filled 2021-11-21: qty 50

## 2021-11-21 MED ORDER — CLOPIDOGREL BISULFATE 75 MG PO TABS
75.0000 mg | ORAL_TABLET | Freq: Every day | ORAL | Status: DC
  Administered 2021-11-22 – 2021-11-28 (×6): 75 mg via ORAL
  Filled 2021-11-21 (×7): qty 1

## 2021-11-21 MED ORDER — PIPERACILLIN-TAZOBACTAM 3.375 G IVPB 30 MIN
3.3750 g | Freq: Once | INTRAVENOUS | Status: AC
  Administered 2021-11-22: 3.375 g via INTRAVENOUS
  Filled 2021-11-21: qty 50

## 2021-11-21 MED ORDER — ROSUVASTATIN CALCIUM 20 MG PO TABS
20.0000 mg | ORAL_TABLET | Freq: Every day | ORAL | Status: DC
  Administered 2021-11-22 – 2021-11-27 (×6): 20 mg via ORAL
  Filled 2021-11-21 (×6): qty 1

## 2021-11-21 MED ORDER — ASPIRIN EC 81 MG PO TBEC
81.0000 mg | DELAYED_RELEASE_TABLET | Freq: Every day | ORAL | Status: DC
  Administered 2021-11-22 – 2021-11-28 (×6): 81 mg via ORAL
  Filled 2021-11-21 (×7): qty 1

## 2021-11-21 MED ORDER — SODIUM CHLORIDE 0.9% FLUSH
3.0000 mL | Freq: Two times a day (BID) | INTRAVENOUS | Status: DC
  Administered 2021-11-22 – 2021-11-28 (×10): 3 mL via INTRAVENOUS

## 2021-11-21 MED ORDER — LINEZOLID 600 MG/300ML IV SOLN
600.0000 mg | Freq: Once | INTRAVENOUS | Status: AC
  Administered 2021-11-22: 600 mg via INTRAVENOUS
  Filled 2021-11-21: qty 300

## 2021-11-21 MED ORDER — ACETAMINOPHEN 650 MG RE SUPP: 650.0000 mg | Freq: Four times a day (QID) | RECTAL | Status: DC | PRN

## 2021-11-21 MED ORDER — HYDROMORPHONE HCL 1 MG/ML IJ SOLN
0.5000 mg | INTRAMUSCULAR | Status: DC | PRN
  Administered 2021-11-24 – 2021-11-27 (×6): 0.5 mg via INTRAVENOUS
  Filled 2021-11-21 (×7): qty 0.5

## 2021-11-21 MED ORDER — ACETAMINOPHEN 325 MG PO TABS
650.0000 mg | ORAL_TABLET | Freq: Four times a day (QID) | ORAL | Status: DC | PRN
  Administered 2021-11-24 – 2021-11-27 (×4): 650 mg via ORAL
  Filled 2021-11-21 (×4): qty 2

## 2021-11-21 MED ORDER — INSULIN ASPART 100 UNIT/ML IJ SOLN
0.0000 [IU] | Freq: Three times a day (TID) | INTRAMUSCULAR | Status: DC
  Administered 2021-11-22 (×2): 8 [IU] via SUBCUTANEOUS
  Administered 2021-11-22: 11 [IU] via SUBCUTANEOUS

## 2021-11-21 NOTE — Progress Notes (Signed)
Rx pain meds for increased episode of foot and ankle pain until patient can be re-evaluated on tomorrow

## 2021-11-21 NOTE — H&P (Signed)
History and Physical   Anntonette Madewell TFT:[id number] DOB: Mar 24, 1961 DOA: 11/21/2021  PCP: Nicholos Johns, MD   Patient coming from: Home  Chief Complaint: Foot pain, drainage  HPI: Kamron Portee is a 60 y.o. female with medical history significant of anxiety, cellulitis, CHF, CAD, hypertension, COPD, GERD, diabetes, V. tach status post ICD implantation who presents with worsening left foot pain.  Patient has had foot pain in her left foot associated fever, chills and nausea over the last 3 days.  She has a history of a left great toe amputation in October and subsequent admission for several days due to cellulitis with I&D at that time.  She has been following with podiatry.  She has been having issues with her wound hearing.  Canals having pain, drainage erythema and edema about the area.  As above she does report fever and chills and nausea.  She denies chest pain, shortness of breath, abdominal pain, constipation, vomiting.  ED Course: Vital signs in the ED significant for blood pressure in the 706C 376E systolic.  Fever to 100.9.  Lab work-up showed CMP with sodium 133 which corrects to normal when you consider glucose of 272, protein 8.2, albumin 3.1.  CBC showed leukocytosis to 18 with hemoglobin stable at 10.7 and platelets stable at 475.  Lipase normal.  Lactic acid normal with repeat pending.  [place] panel for flu and COVID-negative.  Urinalysis pending.  Blood cultures pending.  Foot x-ray showed fracture at the base of the left second phalanx with displacement.  MRI has been ordered and is pending.  Patient received dose of Tylenol in the ED and linezolid and Zosyn have been ordered due to her diabetic foot and allergy profile.  Review of Systems: As per HPI otherwise all other systems reviewed and are negative.  Past Medical History:  Diagnosis Date   Acute chest pain 05/08/2016   Angina pectoris ([place]) 09/11/2017   Burping 05/08/2016   Chronic diastolic heart failure ([place])  11/06/2016   Coronary artery disease involving native coronary artery of native heart with angina pectoris ([place]) 09/12/2015   Overview:  PCI and stent of RCA 2009, last cath 2012 with medical therapy  CABG May 2017   Emphysema lung ([place]) 09/11/2017   Essential hypertension 09/12/2015   GERD (gastroesophageal reflux disease) 05/08/2016   Hyperlipidemia 09/12/2015   Mediastinitis 06/28/2016   Morbid obesity ([place]) 05/08/2016   S/P CABG (coronary artery bypass graft) 06/03/2016   Overview:  The patient underwent sternal reconstruction on 06/21/16 with pec flaps for mediastinitis from a prior CABG in May 2017. On admission, she was critically ill from sepsis and had altered mental status. She was last seen in clinic on 08/09/16 at which time she was doing well.   Severe sepsis ([place]) 06/03/2016   Sinus tachycardia 05/08/2016   Tobacco use disorder 04/20/2016   Overview:  Quit in May 2017.   Uncontrolled type 2 diabetes mellitus 05/08/2016   Wound, surgical, infected 06/06/2016   Overview:  sternal    Past Surgical History:  Procedure Laterality Date   ABDOMINAL AORTOGRAM W/LOWER EXTREMITY N/A 10/11/2021   Procedure: ABDOMINAL AORTOGRAM W/LOWER EXTREMITY;  Surgeon: Marty Heck, MD;  Location: [place] [facility];  Service: Cardiovascular;  Laterality: N/A;   AMPUTATION TOE Right 02/20/2020   Procedure: AMPUTATION RIGHT GREAT  TOE;  Surgeon: Felipa Furnace, DPM;  Location: [place];  Service: Podiatry;  Laterality: Right;   BLADDER SURGERY     CARDIAC CATHETERIZATION  CORONARY ARTERY BYPASS GRAFT     CORONARY STENT INTERVENTION N/A 05/25/2020   Procedure: CORONARY STENT INTERVENTION;  Surgeon: Wellington Hampshire, MD;  Location: [place] [facility];  Service: Cardiovascular;  Laterality: N/A;   CORONARY STENT INTERVENTION N/A 06/22/2020   Procedure: CORONARY STENT INTERVENTION;  Surgeon: Leonie Man, MD;  Location: [place] [facility];  Service: Cardiovascular;  Laterality: N/A;   ICD IMPLANT N/A 12/19/2020    Procedure: ICD IMPLANT;  Surgeon: Evans Lance, MD;  Location: [place] [facility];  Service: Cardiovascular;  Laterality: N/A;   INCISION AND DRAINAGE Right 02/19/2020   Procedure: INCISION AND DRAINAGE;  Surgeon: Trula Slade, DPM;  Location: [place];  Service: Podiatry;  Laterality: Right;  Block done by surgeon   INTRAVASCULAR ULTRASOUND/IVUS N/A 06/22/2020   Procedure: Intravascular Ultrasound/IVUS;  Surgeon: Leonie Man, MD;  Location: [place] [facility];  Service: Cardiovascular;  Laterality: N/A;   IRRIGATION AND DEBRIDEMENT FOOT Left 10/10/2021   Procedure: IRRIGATION AND DEBRIDEMENT FOOT;  Surgeon: Landis Martins, DPM;  Location: [place];  Service: Podiatry;  Laterality: Left;   LEFT HEART CATH AND CORS/GRAFTS ANGIOGRAPHY N/A 05/23/2020   Procedure: LEFT HEART CATH AND CORS/GRAFTS ANGIOGRAPHY;  Surgeon: Nelva Bush, MD;  Location: [facility] [facility];  Service: Cardiovascular;  Laterality: N/A;   LEFT HEART CATH AND CORS/GRAFTS ANGIOGRAPHY N/A 12/19/2020   Procedure: LEFT HEART CATH AND CORS/GRAFTS ANGIOGRAPHY;  Surgeon: Burnell Blanks, MD;  Location: [place] [facility];  Service: Cardiovascular;  Laterality: N/A;   LEFT HEART CATH AND CORS/GRAFTS ANGIOGRAPHY N/A 07/21/2021   Procedure: LEFT HEART CATH AND CORS/GRAFTS ANGIOGRAPHY;  Surgeon: Nelva Bush, MD;  Location: [place] [facility];  Service: Cardiovascular;  Laterality: N/A;   METATARSAL HEAD EXCISION Right 05/02/2020   Procedure: FIRST METATARSAL HEAD RESECTION; RIGHT FOOT WOUND CLOSURE;  Surgeon: Landis Martins, DPM;  Location: [facility];  Service: Podiatry;  Laterality: Right;  MAC W/LOCAL   PERIPHERAL VASCULAR BALLOON ANGIOPLASTY Left 10/11/2021   Procedure: PERIPHERAL VASCULAR BALLOON ANGIOPLASTY;  Surgeon: Marty Heck, MD;  Location: [place];  Service: Cardiovascular;  Laterality: Left;  Anterior Tibial Artery   TUBAL LIGATION      Social History  reports that she  quit smoking about 5 years ago. Her smoking use included cigarettes. She has never used smokeless tobacco. She reports that she does not drink alcohol and does not use drugs.  Allergies  Allergen Reactions   Chlorhexidine Gluconate Itching    Received CHG bath, began itching, required benadryl   Cat Hair Extract Other (See Comments)    Sneezing, watery eyes.   Tramadol Other (See Comments)    Hallucinations   Vancomycin     Other reaction(s): Anaphylaxis*   Codeine Rash    Family History  Problem Relation Age of Onset   Hypertension Mother    Hyperlipidemia Mother    Heart attack Father    Heart disease Father    Hypertension Father    Alzheimer's disease Father    Hypertension Brother    Hyperlipidemia Brother   Reviewed on admission  Prior to Admission medications   Medication Sig Start Date End Date Taking? Authorizing Provider  acetaminophen (TYLENOL) 325 MG tablet Take 2 tablets (650 mg total) by mouth every 4 (four) hours as needed for headache or mild pain. 10/14/21   Nita Sells, MD  amiodarone (PACERONE) 200 MG tablet Take 1 tablet (200 mg total) by mouth daily. 07/22/21  11/14/21  Timothy Lasso, MD  amoxicillin-clavulanate (AUGMENTIN) 875-125 MG tablet TAKE 1 TABLET BY MOUTH EVERY 12 HOURS FOR 8 DAYS 10/14/21   [provider]  aspirin EC 81 MG tablet Take 81 mg by mouth daily. Swallow whole.    [provider]  cholecalciferol (VITAMIN D3) 25 MCG (1000 UNIT) tablet Take 1,000 Units by mouth 2 (two) times a week.    [provider]  clopidogrel (PLAVIX) 75 MG tablet Take 75 mg by mouth daily.    [provider]  cyanocobalamin 1000 MCG tablet Take 1,000 mcg by mouth daily.    [provider]  Evolocumab (REPATHA SURECLICK) 378 MG/ML SOAJ Inject 140 mg into the skin every 14 (fourteen) days. 08/23/21   Richardo Priest, MD  fenofibrate (TRICOR) 145 MG tablet Take 1 tablet (145 mg total) by mouth daily. 07/22/21 11/14/21   Timothy Lasso, MD  furosemide (LASIX) 20 MG tablet Take 20 mg by mouth daily. 10/07/21   [provider]  HUMALOG KWIKPEN 100 UNIT/ML KwikPen Inject 10-50 Units into the skin at bedtime. Sliding scale 09/09/21   [provider]  HYDROmorphone (DILAUDID) 2 MG tablet Take 1 tablet (2 mg total) by mouth every 6 (six) hours as needed for up to 5 days for severe pain or moderate pain. 11/21/21 11/26/21  Landis Martins, DPM  isosorbide mononitrate (IMDUR) 30 MG 24 hr tablet Take 1 tablet (30 mg total) by mouth daily. 10/14/21 11/13/21  Nita Sells, MD  metFORMIN (GLUCOPHAGE) 500 MG tablet Take 1 tablet (500 mg total) by mouth 2 (two) times daily. 07/22/21 10/20/21  Timothy Lasso, MD  metoprolol succinate (TOPROL XL) 25 MG 24 hr tablet Take 1 tablet (25 mg total) by mouth 3 (three) times daily. 10/14/21 11/13/21  Nita Sells, MD  nitroGLYCERIN (NITROSTAT) 0.4 MG SL tablet Place 1 tablet (0.4 mg total) under the tongue every 5 (five) minutes as needed for chest pain. 07/22/21 10/20/21  Timothy Lasso, MD  nystatin (MYCOSTATIN/NYSTOP) powder Apply 1 application topically daily as needed (rash/yeast).    [provider]  rosuvastatin (CRESTOR) 20 MG tablet Take 1 tablet (20 mg total) by mouth at bedtime. 07/22/21 10/20/21  Timothy Lasso, MD  TRESIBA FLEXTOUCH 100 UNIT/ML FlexTouch Pen Inject 20 Units into the skin daily. 10/14/21 01/12/22  Nita Sells, MD  valsartan (DIOVAN) 80 MG tablet Take 1 tablet (80 mg total) by mouth 2 (two) times daily. 10/14/21   Nita Sells, MD  vitamin C (ASCORBIC ACID) 250 MG tablet Take 250 mg by mouth at bedtime.    [provider]    Physical Exam: Vitals:   11/21/21 1806 11/21/21 1935  BP: (!) 150/81 120/70  Pulse: 85 83  Resp: 18 14  Temp: (!) 100.9 F (38.3 C) 99.7 F (37.6 C)  TempSrc: Oral   SpO2: 98% 98%   Physical Exam Constitutional:      General: She is not in acute distress.     Appearance: Normal appearance. She is obese.  HENT:     Head: Normocephalic and atraumatic.     Mouth/Throat:     Mouth: Mucous membranes are moist.     Pharynx: Oropharynx is clear.  Eyes:     Extraocular Movements: Extraocular movements intact.     Pupils: Pupils are equal, round, and reactive to light.  Cardiovascular:     Rate and Rhythm: Normal rate and regular rhythm.     Pulses: Normal pulses.     Heart sounds: Normal heart sounds.  Pulmonary:     Effort: Pulmonary effort is normal. No respiratory distress.     Breath sounds: Normal breath sounds.  Abdominal:     General: Bowel sounds are normal. There is no distension.     Palpations: Abdomen is soft.     Tenderness: There is no abdominal tenderness.  Musculoskeletal:        General: No swelling or deformity.     Comments: Left open foot wound at site of prior amputation and I&D, Erythema and warmth around the wound. Drainage noted.   Skin:    General: Skin is warm and dry.  Neurological:     General: No focal deficit present.     Mental Status: Mental status is at baseline.   Labs on Admission: I have personally reviewed following labs and imaging studies  CBC: Recent Labs  Lab 11/21/21 1828  WBC 18.1*  NEUTROABS 15.3*  HGB 10.7*  HCT 34.8*  MCV 86.1  PLT 475*    Basic Metabolic Panel: Recent Labs  Lab 11/21/21 1828  NA 133*  K 4.5  CL 98  CO2 25  GLUCOSE 272*  BUN 16  CREATININE 0.96  CALCIUM 9.7    GFR: Estimated Creatinine Clearance: 78.7 mL/min (by C-G formula based on SCr of 0.96 mg/dL).  Liver Function Tests: Recent Labs  Lab 11/21/21 1828  AST 21  ALT 19  ALKPHOS 54  BILITOT 0.7  PROT 8.2*  ALBUMIN 3.1*    Urine analysis:    Component Value Date/Time   COLORURINE YELLOW 10/10/2021 0450   APPEARANCEUR CLEAR 10/10/2021 0450   LABSPEC 1.033 (H) 10/10/2021 0450   PHURINE 7.0 10/10/2021 0450   GLUCOSEU >=500 (A) 10/10/2021 0450   HGBUR NEGATIVE 10/10/2021 [place] 10/10/2021 [place] 10/10/2021 0450   PROTEINUR 100 (A) 10/10/2021 0450   NITRITE NEGATIVE 10/10/2021 0450   LEUKOCYTESUR NEGATIVE 10/10/2021 0450    Radiological Exams on Admission: DG Foot Complete Left  Result Date: 11/21/2021 CLINICAL DATA:  Left foot pain EXAM: LEFT FOOT - COMPLETE 3+ VIEW COMPARISON:  None. FINDINGS: Three view radiograph left foot demonstrates surgical changes of a first ray transmetatarsal amputation. There is a subacute intra-articular fracture of the base of second proximal phalanx with lateral dislocation of the a second MTP joint.z no other fracture or dislocation. Extensive soft tissue swelling involving the left forefoot. Superior and plantar calcaneal spurs are present. Vascular calcifications are seen. IMPRESSION: Intra-articular fracture of the base of the second proximal phalanx with lateral dislocation of the second MTP joint. Electronically Signed   By: Fidela Salisbury M.D.   On: 11/21/2021 19:05    EKG: Not performed in the ED   Assessment/Plan Principal Problem:   Diabetic foot infection ([place]) Active Problems:   Chronic diastolic heart failure (H[facility])   Coronary artery disease involving native coronary artery of native heart with angina pectoris ([facility])   Essential hypertension   GERD (gastroesophageal reflux disease)   S/P CABG (coronary artery bypass graft)   Ventricular tachycardia   ICD (implantable cardioverter-defibrillator) in place   Hyperglycemia due to type 2 diabetes mellitus ([place])  Diabetic foot infection > Patient with prior infection and toe amputation with subsequent readmission for cellulitis and difficult wound healing presenting with worsening drainage and pain at the site of her prior amputation and associated fever and chills. > Noted to have leukocytosis to 18.1 and fever to 100.9 in the ED.  Stable vital signs however. > Follows with  podiatry consulted by EDP who recommended antibiotics and MRI.  They will  be following while here. > Started on linezolid and Zosyn given diabetic foot and anaphylaxis reaction to Vanc. > X-ray did not note dislocation and fracture of the second phalanx. - Appreciate podiatry recommendations - Monitor on telemetry - Continue with linezolid and Zosyn - Trend fever curve and white count - Pain control as needed  HTN CHF CAD  > Last echo in January with EF 45-50% with indeterminate diastolic parameters > S/P CABG, Last heart cath in August with severe CAD and 3 occluded bypass grafts and 1 patent bypass graft EF at that time noted to be 40-45%. - Continue home Lasix, Imdur, metoprolol - Continue home aspirin, Plavix - Continue rosuvastatin, PSK 9 i - Continue home valsartan  Diabetes - 30 Units long-acting - SSI   V. tach status post ICD - Continue amiodarone  DVT prophylaxis: SCDs, possible surgery Code Status:   Full  Family Communication:  Family updated at bedside Disposition Plan:   Patient is from:  Home  Anticipated DC to:  Home  Anticipated DC date:  1 to 5 days  Anticipated DC barriers: None  Consults called:  Podiatry consulted by EDP  Admission status:  Observation, telemetry  Severity of Illness: The appropriate patient status for this patient is OBSERVATION. Observation status is judged to be reasonable and necessary in order to provide the required intensity of service to ensure the patient's safety. The patient's presenting symptoms, physical exam findings, and initial radiographic and laboratory data in the context of their medical condition is felt to place them at decreased risk for further clinical deterioration. Furthermore, it is anticipated that the patient will be medically stable for discharge from the hospital within 2 midnights of admission.    Marcelyn Bruins MD [facility]  How to contact the [facility] Attending or Consulting provider [place] or covering provider during after hours [place], for this patient?   Check  the care team in [facility] and look for a) attending/consulting [facility] provider listed and b) the [facility] team listed Log into [email or web address] and use [place]'s universal password to access. If you do not have the password, please contact the hospital operator. Locate the [facility] provider you are looking for under [facility] and page to a number that you can be directly reached. If you still have difficulty reaching the provider, please page the [facility] (Director on Call) for the Hospitalists listed on amion for assistance.  11/21/2021, 11:20 PM

## 2021-11-21 NOTE — ED Triage Notes (Signed)
Patient complains of left foot pain that has increased the past 3 days following recent left great toe amputation. Patient with nausea, fever and chills. Alert and oriented, taking tylenol with no relief

## 2021-11-21 NOTE — Telephone Encounter (Signed)
Patient called she is having severe pain in left ankle it started Saturday evening.  She was just laying down when pain started shooting up her leg from her ankle , what would you like her to do ? Please advise

## 2021-11-21 NOTE — Telephone Encounter (Signed)
Pt called back and was advised to use RICE therapy and keep appt tomorrow.  Pt understood/reb

## 2021-11-21 NOTE — Progress Notes (Signed)
Pharmacy Antibiotic Note  Jasmine Richardson is a 60 y.o. female admitted on 11/21/2021 with  Diabetic foot infection .  Pharmacy has been consulted for Zyvox and Zosyn dosing. Of note, patient has a severe vancomycin allergy. One-time dose of Zyvox given in the ED.  WBC elevated, SCr wnl. LA 1.7  Plan: -Zosyn 3.375 gm IV Q 8 hours (EI infusion) -Will reach out to medical team in AM to obtain ID approval for Zyvox. Discussed with admitting MD  -Monitor CBC, renal fx, cultures and clinical progress     Temp (24hrs), Avg:100.3 F (37.9 C), Min:99.7 F (37.6 C), Max:100.9 F (38.3 C)  Recent Labs  Lab 11/21/21 1828  WBC 18.1*  CREATININE 0.96  LATICACIDVEN 1.7    Estimated Creatinine Clearance: 78.7 mL/min (by C-G formula based on SCr of 0.96 mg/dL).    Allergies  Allergen Reactions   Chlorhexidine Gluconate Itching    Received CHG bath, began itching, required benadryl   Cat Hair Extract Other (See Comments)    Sneezing, watery eyes.   Tramadol Other (See Comments)    Hallucinations   Vancomycin     Other reaction(s): Anaphylaxis*   Codeine Rash    Antimicrobials this admission: Zosyn 12/6 >>  Zyvox 12/6 >>   Dose adjustments this admission:   Microbiology results: 12/6 BCx:    Thank you for allowing pharmacy to be a part of this patient's care.  Albertina Parr, PharmD., BCPS, BCCCP Clinical Pharmacist Please refer to [facility] for unit-specific pharmacist

## 2021-11-21 NOTE — ED Provider Notes (Signed)
Emergency Medicine Provider Triage Evaluation Note  Jasmine Richardson , a 60 y.o. female  was evaluated in triage.  Pt complains of left foot pain x3 days. She also admits to fever, chills, and nausea. Patient had a left great toe amputation on 10/19.  Patient was then admitted to the hospital on 10/24-10/29 due to cellulitis and necrosis of amputation site s/p amputation. Patient had irrigation and debridement of left foot on 10/25. She is followed by Dr. Cannon Kettle with podiatry. Last office visit 12/2.  Review of Systems  Positive: Fever, arthralgia  Negative: diarrhea  Physical Exam  BP (!) 150/81   Pulse 85   Temp (!) 100.9 F (38.3 C) (Oral)   Resp 18   LMP  (LMP Unknown)   SpO2 98%  Gen:   Awake, no distress   Resp:  Normal effort  MSK:   Moves extremities without difficulty  Other:  Left foot in ace wrap with  Medical Decision Making  Medically screening exam initiated at 6:11 PM.  Appropriate orders placed.  Ross Hefferan was informed that the remainder of the evaluation will be completed by another provider, this initial triage assessment does not replace that evaluation, and the importance of remaining in the ED until their evaluation is complete.  Febrile in triage, possibly from toe infection. COVID/influenza to rule out infection. Blood cultures and lactic acid.    Karie Kirks 11/21/21 1819    Blanchie Dessert, MD 11/21/21 2139

## 2021-11-21 NOTE — Telephone Encounter (Signed)
Lmom for patient  Rest ice elevate and stay off foot. I will see her on tomorrow she has an appt

## 2021-11-22 ENCOUNTER — Encounter: Payer: Managed Care, Other (non HMO) | Admitting: Sports Medicine

## 2021-11-22 ENCOUNTER — Observation Stay (HOSPITAL_COMMUNITY): Payer: Managed Care, Other (non HMO)

## 2021-11-22 ENCOUNTER — Telehealth: Payer: Self-pay | Admitting: Sports Medicine

## 2021-11-22 ENCOUNTER — Inpatient Hospital Stay (HOSPITAL_COMMUNITY): Payer: Managed Care, Other (non HMO)

## 2021-11-22 DIAGNOSIS — Z87891 Personal history of nicotine dependence: Secondary | ICD-10-CM | POA: Diagnosis not present

## 2021-11-22 DIAGNOSIS — Z6836 Body mass index (BMI) 36.0-36.9, adult: Secondary | ICD-10-CM | POA: Diagnosis not present

## 2021-11-22 DIAGNOSIS — Z885 Allergy status to narcotic agent status: Secondary | ICD-10-CM | POA: Diagnosis not present

## 2021-11-22 DIAGNOSIS — E669 Obesity, unspecified: Secondary | ICD-10-CM | POA: Diagnosis present

## 2021-11-22 DIAGNOSIS — Z95810 Presence of automatic (implantable) cardiac defibrillator: Secondary | ICD-10-CM | POA: Diagnosis not present

## 2021-11-22 DIAGNOSIS — E1169 Type 2 diabetes mellitus with other specified complication: Secondary | ICD-10-CM | POA: Diagnosis present

## 2021-11-22 DIAGNOSIS — L089 Local infection of the skin and subcutaneous tissue, unspecified: Secondary | ICD-10-CM | POA: Diagnosis not present

## 2021-11-22 DIAGNOSIS — E1152 Type 2 diabetes mellitus with diabetic peripheral angiopathy with gangrene: Secondary | ICD-10-CM | POA: Diagnosis present

## 2021-11-22 DIAGNOSIS — I2510 Atherosclerotic heart disease of native coronary artery without angina pectoris: Secondary | ICD-10-CM | POA: Diagnosis present

## 2021-11-22 DIAGNOSIS — A4102 Sepsis due to Methicillin resistant Staphylococcus aureus: Secondary | ICD-10-CM | POA: Diagnosis present

## 2021-11-22 DIAGNOSIS — I110 Hypertensive heart disease with heart failure: Secondary | ICD-10-CM | POA: Diagnosis present

## 2021-11-22 DIAGNOSIS — Z83438 Family history of other disorder of lipoprotein metabolism and other lipidemia: Secondary | ICD-10-CM | POA: Diagnosis not present

## 2021-11-22 DIAGNOSIS — E11628 Type 2 diabetes mellitus with other skin complications: Secondary | ICD-10-CM | POA: Diagnosis present

## 2021-11-22 DIAGNOSIS — J439 Emphysema, unspecified: Secondary | ICD-10-CM | POA: Diagnosis present

## 2021-11-22 DIAGNOSIS — Z955 Presence of coronary angioplasty implant and graft: Secondary | ICD-10-CM | POA: Diagnosis not present

## 2021-11-22 DIAGNOSIS — M84675A Pathological fracture in other disease, left foot, initial encounter for fracture: Secondary | ICD-10-CM | POA: Diagnosis present

## 2021-11-22 DIAGNOSIS — I5042 Chronic combined systolic (congestive) and diastolic (congestive) heart failure: Secondary | ICD-10-CM | POA: Diagnosis present

## 2021-11-22 DIAGNOSIS — Z20822 Contact with and (suspected) exposure to covid-19: Secondary | ICD-10-CM | POA: Diagnosis present

## 2021-11-22 DIAGNOSIS — R7881 Bacteremia: Secondary | ICD-10-CM

## 2021-11-22 DIAGNOSIS — Z8249 Family history of ischemic heart disease and other diseases of the circulatory system: Secondary | ICD-10-CM | POA: Diagnosis not present

## 2021-11-22 DIAGNOSIS — M86172 Other acute osteomyelitis, left ankle and foot: Secondary | ICD-10-CM | POA: Diagnosis not present

## 2021-11-22 DIAGNOSIS — L03032 Cellulitis of left toe: Secondary | ICD-10-CM | POA: Diagnosis present

## 2021-11-22 DIAGNOSIS — E1165 Type 2 diabetes mellitus with hyperglycemia: Secondary | ICD-10-CM | POA: Diagnosis present

## 2021-11-22 DIAGNOSIS — Z951 Presence of aortocoronary bypass graft: Secondary | ICD-10-CM | POA: Diagnosis not present

## 2021-11-22 DIAGNOSIS — Z888 Allergy status to other drugs, medicaments and biological substances status: Secondary | ICD-10-CM | POA: Diagnosis not present

## 2021-11-22 DIAGNOSIS — D62 Acute posthemorrhagic anemia: Secondary | ICD-10-CM | POA: Diagnosis not present

## 2021-11-22 HISTORY — DX: Bacteremia: R78.81

## 2021-11-22 LAB — BLOOD CULTURE ID PANEL (REFLEXED) - BCID2

## 2021-11-22 LAB — CBG MONITORING, ED
Glucose-Capillary: 253 mg/dL — ABNORMAL HIGH (ref 70–99)
Glucose-Capillary: 274 mg/dL — ABNORMAL HIGH (ref 70–99)
Glucose-Capillary: 297 mg/dL — ABNORMAL HIGH (ref 70–99)
Glucose-Capillary: 317 mg/dL — ABNORMAL HIGH (ref 70–99)

## 2021-11-22 LAB — GLUCOSE, CAPILLARY: Glucose-Capillary: 194 mg/dL — ABNORMAL HIGH (ref 70–99)

## 2021-11-22 MED ORDER — SODIUM CHLORIDE 0.9 % IV SOLN
8.0000 mg/kg | Freq: Every day | INTRAVENOUS | Status: DC
  Administered 2021-11-22 – 2021-11-27 (×6): 650 mg via INTRAVENOUS
  Filled 2021-11-22 (×7): qty 13

## 2021-11-22 MED ORDER — INSULIN ASPART 100 UNIT/ML IJ SOLN
5.0000 [IU] | Freq: Three times a day (TID) | INTRAMUSCULAR | Status: DC
  Administered 2021-11-23 – 2021-11-24 (×3): 5 [IU] via SUBCUTANEOUS

## 2021-11-22 MED ORDER — AMIODARONE HCL 200 MG PO TABS
200.0000 mg | ORAL_TABLET | Freq: Every day | ORAL | Status: DC
  Administered 2021-11-22 – 2021-11-28 (×7): 200 mg via ORAL
  Filled 2021-11-22 (×7): qty 1

## 2021-11-22 MED ORDER — LINEZOLID 600 MG/300ML IV SOLN
600.0000 mg | Freq: Two times a day (BID) | INTRAVENOUS | Status: DC
Start: 2021-11-22 — End: 2021-11-22
  Administered 2021-11-22: 600 mg via INTRAVENOUS
  Filled 2021-11-22 (×2): qty 300

## 2021-11-22 NOTE — Consult Note (Addendum)
[facility]    Date of Admission:  11/21/2021   Total days of inpatient antibiotics 2        Reason for Consult: MRSA bacteremia    Principal Problem:   Diabetic foot infection ([place]) Active Problems:   Chronic diastolic heart failure ([place])   Coronary artery disease involving native coronary artery of native heart with angina pectoris (HCC)   Essential hypertension   GERD (gastroesophageal reflux disease)   S/P CABG (coronary artery bypass graft)   Ventricular tachycardia   ICD (implantable cardioverter-defibrillator) in place   Hyperglycemia due to type 2 diabetes mellitus ([place])   Bacteremia   Assessment: 60 year old female with uncontrolled diabetes with A1c 12.8, diabetic left foot wounds status post I&D on 10/10/21 followed by podiatry, V. tach status post ICD implantation admitted for sepsis secondary to left foot wound.  ID is following due to MRSA bacteremia.  # MRSA Bacteremia 2/2 diabetic left foot wound #ICD #Uncontrolled diabetes #Vancomycin allergy #Breast cellulitis SP I&D on 10/23/21 -SP 1st metatarsal revision on 05/02/21 with residual OR Cx+ staph aureus, treated with bactrim x 2 weeks. Suspect pt had residual OM and did not receive antibiotics past 2 weeks.  -I&D Cx of left foot wound from 10/10/21 are form deep wound, does not appear they are from bone. Treated with 2 weeks of antibiotics(augment/fluconazole x7 days). -Pt reports right breast erythema started at the same time as her foot worsened.  There is a note by Dr. Noberto Retort on 10/23/21([facility] in [place])where right breast wound was debrided (gangrenous skin/subcutaneous tissue).  Recommendations: -D/C linezolid -Start daptomycin and continue pip-tazo  -Obtain TTE -Obtain US right breast -Repeat blood Cx -Agree with MRI of left foot, please obtain residual bone Cx in OR tomorrow   Microbiology:   Antibiotics: Linezolid 12/06-p Pip-tazo 12/06-p    Cultures: Blood 12/06 + MRSA on BCID    HPI: Jasmine Richardson is a 60 y.o. female anxiety, CHF, CAD, diabetes with K4M 01.0 complicated by diabetic foot wound status post revision first metatarsal amputation on 05/02/2021 and I&D on 10/10/21, COPD, V. tach status post ICD implantation presented with worsening left foot.  She reported fevers chills and nausea prior to arrival, she also reported draining left foot. On arrival to the ED patient had temp of 100.9 and wbc 18 K.  Foot x-ray showed fracture of the base of left second phalanx.  Podiatry consulted, recommended MRI. ID consulted blood cultures returned positive for MRSA. Today, patient is resting in bed.  She reports her right breast wound has become erythematous the left foot wound started draining more.  She reports she underwent I&D this year of her right breast.  Review of Systems: ROS  Past Medical History:  Diagnosis Date   Acute chest pain 05/08/2016   Angina pectoris ([place]) 09/11/2017   Burping 05/08/2016   Chronic diastolic heart failure ([place]) 11/06/2016   Coronary artery disease involving native coronary artery of native heart with angina pectoris ([place]) 09/12/2015   Overview:  PCI and stent of RCA 2009, last cath 2012 with medical therapy  CABG May 2017   Emphysema lung ([place]) 09/11/2017   Essential hypertension 09/12/2015   GERD (gastroesophageal reflux disease) 05/08/2016   Hyperlipidemia 09/12/2015   Mediastinitis 06/28/2016   Morbid obesity ([place]) 05/08/2016   S/P CABG (coronary artery bypass graft) 06/03/2016   Overview:  The patient underwent sternal reconstruction on 06/21/16 with pec flaps for mediastinitis from a prior  CABG in May 2017. On admission, she was critically ill from sepsis and had altered mental status. She was last seen in clinic on 08/09/16 at which time she was doing well.   Severe sepsis ([place]) 06/03/2016   Sinus tachycardia 05/08/2016   Tobacco use disorder 04/20/2016   Overview:  Quit in May 2017.   Uncontrolled  type 2 diabetes mellitus 05/08/2016   Wound, surgical, infected 06/06/2016   Overview:  sternal    Social History   Tobacco Use   Smoking status: Former    Types: Cigarettes    Quit date: 05/2016    Years since quitting: 5.5   Smokeless tobacco: Never  Vaping Use   Vaping Use: Never used  Substance Use Topics   Alcohol use: No   Drug use: No    Family History  Problem Relation Age of Onset   Hypertension Mother    Hyperlipidemia Mother    Heart attack Father    Heart disease Father    Hypertension Father    Alzheimer's disease Father    Hypertension Brother    Hyperlipidemia Brother    Scheduled Meds:  amiodarone  200 mg Oral Daily   aspirin EC  81 mg Oral Daily   clopidogrel  75 mg Oral Daily   insulin aspart  0-15 Units Subcutaneous TID WC   insulin aspart  0-5 Units Subcutaneous QHS   insulin aspart  5 Units Subcutaneous TID WC   insulin glargine-yfgn  30 Units Subcutaneous Daily   rosuvastatin  20 mg Oral QHS   sodium chloride flush  3 mL Intravenous Q12H   Continuous Infusions:  DAPTOmycin (CUBICIN)  IV     PRN Meds:.acetaminophen **OR** acetaminophen, HYDROmorphone (DILAUDID) injection, polyethylene glycol Allergies  Allergen Reactions   Chlorhexidine Gluconate Itching    Received CHG bath, began itching, required benadryl   Cat Hair Extract Other (See Comments)    Sneezing, watery eyes.   Tramadol Other (See Comments)    Hallucinations   Vancomycin     Other reaction(s): Anaphylaxis*   Codeine Rash    OBJECTIVE: Blood pressure 139/82, pulse 70, temperature 98.7 F (37.1 C), temperature source Oral, resp. rate 17, SpO2 (!) 87 %.  Physical Exam Constitutional:      Appearance: Normal appearance.  HENT:     Head: Normocephalic and atraumatic.     Right Ear: Tympanic membrane normal.     Left Ear: Tympanic membrane normal.     Nose: Nose normal.     Mouth/Throat:     Mouth: Mucous membranes are moist.  Eyes:     Extraocular Movements:  Extraocular movements intact.     Conjunctiva/sclera: Conjunctivae normal.     Pupils: Pupils are equal, round, and reactive to light.  Cardiovascular:     Rate and Rhythm: Normal rate and regular rhythm.     Heart sounds: No murmur heard.   No friction rub. No gallop.  Pulmonary:     Effort: Pulmonary effort is normal.     Breath sounds: Normal breath sounds.  Abdominal:     General: Abdomen is flat.     Palpations: Abdomen is soft.  Musculoskeletal:        General: Normal range of motion.  Skin:    General: Skin is warm and dry.     Comments: Left 5 cm open draining wound around site of 1 st metatarsal amputation Eschar with surrounding erythema at right breast  Neurological:     General: No focal deficit present.  Mental Status: She is alert and oriented to person, place, and time.  Psychiatric:        Mood and Affect: Mood normal.    Lab Results Lab Results  Component Value Date   WBC 18.1 (H) 11/21/2021   HGB 10.7 (L) 11/21/2021   HCT 34.8 (L) 11/21/2021   MCV 86.1 11/21/2021   PLT 475 (H) 11/21/2021    Lab Results  Component Value Date   CREATININE 0.96 11/21/2021   BUN 16 11/21/2021   NA 133 (L) 11/21/2021   K 4.5 11/21/2021   CL 98 11/21/2021   CO2 25 11/21/2021    Lab Results  Component Value Date   ALT 19 11/21/2021   AST 21 11/21/2021   ALKPHOS 54 11/21/2021   BILITOT 0.7 11/21/2021       Jasmine Richardson, [facility] 11/22/2021, 3:11 PM

## 2021-11-22 NOTE — Progress Notes (Addendum)
PROGRESS NOTE    Jasmine Richardson  TDH:[id number] DOB: Jun 11, 1961 DOA: 11/21/2021 PCP: Nicholos Johns, MD  Brief Narrative: 60/F with history of CAD, COPD, chronic systolic/diastolic CHF, type 2 diabetes mellitus, anxiety, depression, history of V. tach, ICD presented to the ED with worsening left foot pain. -Recent left great toe amputation in October, followed by admission for I&D. -In the emergency room she was febrile to 100.9, labs noted WBC of 18, foot x-ray noted fracture base of left second phalanx with displacement -Started on linezolid and Zosyn   Assessment & Plan:   Sepsis, POA Left diabetic foot infection, recent toe amputation and debridement MRSA bacteremia -Continue IV daptomycin -Infectious disease consulting -Podiatry consulting, MRI foot pending -Possible OR tomorrow for I&D, wound VAC,?  Amputation of second digit  PAD -Recently underwent aortogram, left anterior tibial artery angioplasty by Dr. Carlis Abbott on 10/26 -Continue aspirin Plavix statin  Type 2 diabetes mellitus -Continue Semglee 30 units, CBGs poorly controlled, add NovoLog with meals, sliding scale insulin -Check MIW8E  Chronic systolic CHF CAD Hypertension -Clinically euvolemic at this time, continue home regimen of aspirin, Plavix, Imdur, metoprolol, Lasix -Continue valsartan  History of V. Tach s/p AICD -Continue amiodarone  DVT prophylaxis: SCDs Code Status: Full code Family Communication: Discussed with patient in detail, no family at bedside Disposition Plan:  Status is: Observation  The patient will require care spanning > 2 midnights and should be moved to inpatient because: Severity of illness  Consultants:  Podiatry, infectious disease  Procedures:   Antimicrobials:    Subjective: -Continues to have some discomfort in her foot, is cold  Objective: Vitals:   11/22/21 0154 11/22/21 0601 11/22/21 0912 11/22/21 1349  BP: 130/80 138/71 138/74 139/82  Pulse: 62 79 74 70  Resp: 15  18 16 17   Temp:   98.7 F (37.1 C)   TempSrc:   Oral   SpO2: 98% 96% 97% (!) 87%    Intake/Output Summary (Last 24 hours) at 11/22/2021 1400 Last data filed at 11/22/2021 0951 Gross per 24 hour  Intake 37.04 ml  Output --  Net 37.04 ml   There were no vitals filed for this visit.  Examination:  General exam: Pleasant obese female laying in bed, AAOx3, no distress HEENT: No JVD CVS: S1-S2, regular rate rhythm Lungs: Decreased breath sounds to bases otherwise clear Abdomen: Soft, obese, nontender, bowel sounds present Extremities: Left foot with dressing, erythema warmth and swelling of the overlying dorsal foot Psychiatry: Judgement and insight appear normal. Mood & affect appropriate.   Data Reviewed:   CBC: Recent Labs  Lab 11/21/21 1828  WBC 18.1*  NEUTROABS 15.3*  HGB 10.7*  HCT 34.8*  MCV 86.1  PLT 321*   Basic Metabolic Panel: Recent Labs  Lab 11/21/21 1828  NA 133*  K 4.5  CL 98  CO2 25  GLUCOSE 272*  BUN 16  CREATININE 0.96  CALCIUM 9.7   GFR: Estimated Creatinine Clearance: 78.7 mL/min (by C-G formula based on SCr of 0.96 mg/dL). Liver Function Tests: Recent Labs  Lab 11/21/21 1828  AST 21  ALT 19  ALKPHOS 54  BILITOT 0.7  PROT 8.2*  ALBUMIN 3.1*   Recent Labs  Lab 11/21/21 1828  LIPASE 25   No results for input(s): AMMONIA in the last 168 hours. Coagulation Profile: No results for input(s): INR, PROTIME in the last 168 hours. Cardiac Enzymes: No results for input(s): CKTOTAL, CKMB, CKMBINDEX, TROPONINI in the last 168 hours. BNP (last 3 results) Recent Labs  01/12/21 1013  PROBNP 246   HbA1C: No results for input(s): HGBA1C in the last 72 hours. CBG: Recent Labs  Lab 11/22/21 0054 11/22/21 0743 11/22/21 1208  GLUCAP 274* 297* 317*   Lipid Profile: No results for input(s): CHOL, HDL, LDLCALC, TRIG, CHOLHDL, LDLDIRECT in the last 72 hours. Thyroid Function Tests: No results for input(s): TSH, T4TOTAL, FREET4, T3FREE,  THYROIDAB in the last 72 hours. Anemia Panel: No results for input(s): VITAMINB12, FOLATE, FERRITIN, TIBC, IRON, RETICCTPCT in the last 72 hours. Urine analysis:    Component Value Date/Time   COLORURINE YELLOW 10/10/2021 0450   APPEARANCEUR CLEAR 10/10/2021 0450   LABSPEC 1.033 (H) 10/10/2021 0450   PHURINE 7.0 10/10/2021 0450   GLUCOSEU >=500 (A) 10/10/2021 0450   HGBUR NEGATIVE 10/10/2021 0450   BILIRUBINUR NEGATIVE 10/10/2021 0450   KETONESUR NEGATIVE 10/10/2021 0450   PROTEINUR 100 (A) 10/10/2021 0450   NITRITE NEGATIVE 10/10/2021 0450   LEUKOCYTESUR NEGATIVE 10/10/2021 0450   Sepsis Labs: @LABRCNTIP (procalcitonin:4,lacticidven:4)  ) Recent Results (from the past 240 hour(s))  Resp Panel by RT-PCR (Flu A&B, Covid) Nasopharyngeal Swab     Status: None   Collection Time: 11/21/21  6:18 PM   Specimen: Nasopharyngeal Swab; Nasopharyngeal(NP) swabs in vial transport medium  Result Value Ref Range Status   SARS Coronavirus 2 by RT PCR NEGATIVE NEGATIVE Final    Comment: (NOTE) SARS-CoV-2 target nucleic acids are NOT DETECTED.  The SARS-CoV-2 RNA is generally detectable in upper respiratory specimens during the acute phase of infection. The lowest concentration of SARS-CoV-2 viral copies this assay can detect is 138 copies/mL. A negative result does not preclude SARS-Cov-2 infection and should not be used as the sole basis for treatment or other patient management decisions. A negative result may occur with  improper specimen collection/handling, submission of specimen other than nasopharyngeal swab, presence of viral mutation(s) within the areas targeted by this assay, and inadequate number of viral copies(<138 copies/mL). A negative result must be combined with clinical observations, patient history, and epidemiological information. The expected result is Negative.  Fact Sheet for Patients:  EntrepreneurPulse.com.au  Fact Sheet for Healthcare Providers:   IncredibleEmployment.be  This test is no t yet approved or cleared by the [place] FDA and  has been authorized for detection and/or diagnosis of SARS-CoV-2 by FDA under an Emergency Use Authorization (EUA). This EUA will remain  in effect (meaning this test can be used) for the duration of the COVID-19 declaration under Section 564(b)(1) of the Act, 21 U.S.C.section 360bbb-3(b)(1), unless the authorization is terminated  or revoked sooner.       Influenza A by PCR NEGATIVE NEGATIVE Final   Influenza B by PCR NEGATIVE NEGATIVE Final    Comment: (NOTE) The Xpert Xpress SARS-CoV-2/FLU/RSV plus assay is intended as an aid in the diagnosis of influenza from Nasopharyngeal swab specimens and should not be used as a sole basis for treatment. Nasal washings and aspirates are unacceptable for Xpert Xpress SARS-CoV-2/FLU/RSV testing.  Fact Sheet for Patients: EntrepreneurPulse.com.au  Fact Sheet for Healthcare Providers: IncredibleEmployment.be  This test is not yet approved or cleared by the [place] FDA and has been authorized for detection and/or diagnosis of SARS-CoV-2 by FDA under an Emergency Use Authorization (EUA). This EUA will remain in effect (meaning this test can be used) for the duration of the COVID-19 declaration under Section 564(b)(1) of the Act, 21 U.S.C. section 360bbb-3(b)(1), unless the authorization is terminated or revoked.  Performed at [facility], [place]  [address], [place], [place] [address]   Blood culture (routine x 2)     Status: None (Preliminary result)   Collection Time: 11/21/21  6:28 PM   Specimen: Site Not Specified; Blood  Result Value Ref Range Status   Specimen Description SITE NOT SPECIFIED  Final   Special Requests   Final    BOTTLES DRAWN AEROBIC AND ANAEROBIC Blood Culture results may not be optimal due to an excessive volume of blood received in culture bottles    Culture  Setup Time   Final    GRAM POSITIVE COCCI IN CLUSTERS AEROBIC BOTTLE ONLY Organism ID to follow CRITICAL RESULT CALLED TO, READ BACK BY AND VERIFIED WITH: K PATEL PHARMD 1340 11/22/21 A BROWNING Performed at [facility], [place] [address] [place], [place] [address]    Culture GRAM POSITIVE COCCI  Final   Report Status PENDING  Incomplete  Blood Culture ID Panel (Reflexed)     Status: Abnormal   Collection Time: 11/21/21  6:28 PM  Result Value Ref Range Status   Enterococcus faecalis NOT DETECTED NOT DETECTED Final   Enterococcus Faecium NOT DETECTED NOT DETECTED Final   Listeria monocytogenes NOT DETECTED NOT DETECTED Final   Staphylococcus species DETECTED (A) NOT DETECTED Final    Comment: CRITICAL RESULT CALLED TO, READ BACK BY AND VERIFIED WITH: K PATEL PHARMD 1340 11/22/21 A BROWNING    Staphylococcus aureus (BCID) DETECTED (A) NOT DETECTED Final    Comment: Methicillin (oxacillin)-resistant Staphylococcus aureus (MRSA). MRSA is predictably resistant to beta-lactam antibiotics (except ceftaroline). Preferred therapy is vancomycin unless clinically contraindicated. Patient requires contact precautions if  hospitalized. CRITICAL RESULT CALLED TO, READ BACK BY AND VERIFIED WITH: K PATEL PHARMD 1340 11/22/21 A BROWNING    Staphylococcus epidermidis NOT DETECTED NOT DETECTED Final   Staphylococcus lugdunensis NOT DETECTED NOT DETECTED Final   Streptococcus species NOT DETECTED NOT DETECTED Final   Streptococcus agalactiae NOT DETECTED NOT DETECTED Final   Streptococcus pneumoniae NOT DETECTED NOT DETECTED Final   Streptococcus pyogenes NOT DETECTED NOT DETECTED Final   A.calcoaceticus-baumannii NOT DETECTED NOT DETECTED Final   Bacteroides fragilis NOT DETECTED NOT DETECTED Final   Enterobacterales NOT DETECTED NOT DETECTED Final   Enterobacter cloacae complex NOT DETECTED NOT DETECTED Final   Escherichia coli NOT DETECTED NOT DETECTED Final   Klebsiella aerogenes NOT  DETECTED NOT DETECTED Final   Klebsiella oxytoca NOT DETECTED NOT DETECTED Final   Klebsiella pneumoniae NOT DETECTED NOT DETECTED Final   Proteus species NOT DETECTED NOT DETECTED Final   Salmonella species NOT DETECTED NOT DETECTED Final   Serratia marcescens NOT DETECTED NOT DETECTED Final   Haemophilus influenzae NOT DETECTED NOT DETECTED Final   Neisseria meningitidis NOT DETECTED NOT DETECTED Final   Pseudomonas aeruginosa NOT DETECTED NOT DETECTED Final   Stenotrophomonas maltophilia NOT DETECTED NOT DETECTED Final   Candida albicans NOT DETECTED NOT DETECTED Final   Candida auris NOT DETECTED NOT DETECTED Final   Candida glabrata NOT DETECTED NOT DETECTED Final   Candida krusei NOT DETECTED NOT DETECTED Final   Candida parapsilosis NOT DETECTED NOT DETECTED Final   Candida tropicalis NOT DETECTED NOT DETECTED Final   Cryptococcus neoformans/gattii NOT DETECTED NOT DETECTED Final   Meth resistant mecA/C and MREJ DETECTED (A) NOT DETECTED Final    Comment: CRITICAL RESULT CALLED TO, READ BACK BY AND VERIFIED WITH: K PATEL PHARMD 1340 11/22/21 A BROWNING Performed at [facility], [address], [place], [place] [address]  Radiology Studies: DG Foot Complete Left  Result Date: 11/21/2021 CLINICAL DATA:  Left foot pain EXAM: LEFT FOOT - COMPLETE 3+ VIEW COMPARISON:  None. FINDINGS: Three view radiograph left foot demonstrates surgical changes of a first ray transmetatarsal amputation. There is a subacute intra-articular fracture of the base of second proximal phalanx with lateral dislocation of the a second MTP joint.z no other fracture or dislocation. Extensive soft tissue swelling involving the left forefoot. Superior and plantar calcaneal spurs are present. Vascular calcifications are seen. IMPRESSION: Intra-articular fracture of the base of the second proximal phalanx with lateral dislocation of the second MTP joint. Electronically Signed   By: Fidela Salisbury M.D.    On: 11/21/2021 19:05        Scheduled Meds:  aspirin EC  81 mg Oral Daily   clopidogrel  75 mg Oral Daily   insulin aspart  0-15 Units Subcutaneous TID WC   insulin aspart  0-5 Units Subcutaneous QHS   insulin glargine-yfgn  30 Units Subcutaneous Daily   rosuvastatin  20 mg Oral QHS   sodium chloride flush  3 mL Intravenous Q12H   Continuous Infusions:  DAPTOmycin (CUBICIN)  IV       LOS: 0 days    Time spent:  83min    Domenic Polite, MD [facility]   11/22/2021, 2:00 PM

## 2021-11-22 NOTE — Consult Note (Signed)
Subjective:  [name], female    DOB: 03-29-1961,  MRN: [id number]  Patient with past medical history of anxiety, cellulitis, CHF, CAD, hypertension, COPD, GERD, diabetes, V. tach status post ICD implantation seen at beside today for worsening left foot wound. Patient had a left great toe amputation on 10/19 following that she underwent debridement on 10/25 with Dr. Cannon Kettle. Relates worsening pain and redness of the left foot.   Past Medical History:  Diagnosis Date   Acute chest pain 05/08/2016   Angina pectoris ([place]) 09/11/2017   Burping 05/08/2016   Chronic diastolic heart failure ([place]) 11/06/2016   Coronary artery disease involving native coronary artery of native heart with angina pectoris ([place]) 09/12/2015   Overview:  PCI and stent of RCA 2009, last cath 2012 with medical therapy  CABG May 2017   Emphysema lung ([place]) 09/11/2017   Essential hypertension 09/12/2015   GERD (gastroesophageal reflux disease) 05/08/2016   Hyperlipidemia 09/12/2015   Mediastinitis 06/28/2016   Morbid obesity ([place]) 05/08/2016   S/P CABG (coronary artery bypass graft) 06/03/2016   Overview:  The patient underwent sternal reconstruction on 06/21/16 with pec flaps for mediastinitis from a prior CABG in May 2017. On admission, she was critically ill from sepsis and had altered mental status. She was last seen in clinic on 08/09/16 at which time she was doing well.   Severe sepsis ([place]) 06/03/2016   Sinus tachycardia 05/08/2016   Tobacco use disorder 04/20/2016   Overview:  Quit in May 2017.   Uncontrolled type 2 diabetes mellitus 05/08/2016   Wound, surgical, infected 06/06/2016   Overview:  sternal     Past Surgical History:  Procedure Laterality Date   ABDOMINAL AORTOGRAM W/LOWER EXTREMITY N/A 10/11/2021   Procedure: ABDOMINAL AORTOGRAM W/LOWER EXTREMITY;  Surgeon: Marty Heck, MD;  Location: [facility];  Service: Cardiovascular;  Laterality: N/A;   AMPUTATION TOE Right 02/20/2020    Procedure: AMPUTATION RIGHT GREAT  TOE;  Surgeon: Felipa Furnace, DPM;  Location: [facility];  Service: Podiatry;  Laterality: Right;   BLADDER SURGERY     CARDIAC CATHETERIZATION     CORONARY ARTERY BYPASS GRAFT     CORONARY STENT INTERVENTION N/A 05/25/2020   Procedure: CORONARY STENT INTERVENTION;  Surgeon: Wellington Hampshire, MD;  Location: [facility];  Service: Cardiovascular;  Laterality: N/A;   CORONARY STENT INTERVENTION N/A 06/22/2020   Procedure: CORONARY STENT INTERVENTION;  Surgeon: Leonie Man, MD;  Location: [facility];  Service: Cardiovascular;  Laterality: N/A;   ICD IMPLANT N/A 12/19/2020   Procedure: ICD IMPLANT;  Surgeon: Evans Lance, MD;  Location: [facility];  Service: Cardiovascular;  Laterality: N/A;   INCISION AND DRAINAGE Right 02/19/2020   Procedure: INCISION AND DRAINAGE;  Surgeon: Trula Slade, DPM;  Location: [facility];  Service: Podiatry;  Laterality: Right;  Block done by surgeon   INTRAVASCULAR ULTRASOUND/IVUS N/A 06/22/2020   Procedure: Intravascular Ultrasound/IVUS;  Surgeon: Leonie Man, MD;  Location: [facility];  Service: Cardiovascular;  Laterality: N/A;   IRRIGATION AND DEBRIDEMENT FOOT Left 10/10/2021   Procedure: IRRIGATION AND DEBRIDEMENT FOOT;  Surgeon: Landis Martins, DPM;  Location: [facility];  Service: Podiatry;  Laterality: Left;   LEFT HEART CATH AND CORS/GRAFTS ANGIOGRAPHY N/A 05/23/2020   Procedure: LEFT HEART CATH AND CORS/GRAFTS ANGIOGRAPHY;  Surgeon: Nelva Bush, MD;  Location: [facility];  Service: Cardiovascular;  Laterality: N/A;   LEFT HEART CATH AND CORS/GRAFTS  ANGIOGRAPHY N/A 12/19/2020   Procedure: LEFT HEART CATH AND CORS/GRAFTS ANGIOGRAPHY;  Surgeon: Burnell Blanks, MD;  Location: [facility];  Service: Cardiovascular;  Laterality: N/A;   LEFT HEART CATH AND CORS/GRAFTS ANGIOGRAPHY N/A 07/21/2021   Procedure: LEFT HEART CATH AND CORS/GRAFTS ANGIOGRAPHY;  Surgeon: Nelva Bush,  MD;  Location: [facility];  Service: Cardiovascular;  Laterality: N/A;   METATARSAL HEAD EXCISION Right 05/02/2020   Procedure: FIRST METATARSAL HEAD RESECTION; RIGHT FOOT WOUND CLOSURE;  Surgeon: Landis Martins, DPM;  Location: [facility];  Service: Podiatry;  Laterality: Right;  MAC W/LOCAL   PERIPHERAL VASCULAR BALLOON ANGIOPLASTY Left 10/11/2021   Procedure: PERIPHERAL VASCULAR BALLOON ANGIOPLASTY;  Surgeon: Marty Heck, MD;  Location: [facility];  Service: Cardiovascular;  Laterality: Left;  Anterior Tibial Artery   TUBAL LIGATION      CBC Latest Ref Rng & Units 11/21/2021 10/14/2021 10/13/2021  WBC 4.0 - 10.5 K/uL 18.1(H) 11.2(H) 11.8(H)  Hemoglobin 12.0 - 15.0 g/dL 10.7(L) 9.0(L) 9.1(L)  Hematocrit 36.0 - 46.0 % 34.8(L) 28.8(L) 29.6(L)  Platelets 150 - 400 K/uL 475(H) 398 413(H)    BMP Latest Ref Rng & Units 11/21/2021 10/13/2021 10/12/2021  Glucose 70 - 99 mg/dL 272(H) 170(H) 142(H)  BUN 8 - 23 mg/dL _0 Creatinine 0.44 - 1.00 mg/dL 0.96 1.14(H) 0.98  BUN/Creat Ratio 12 - 28 - - -  Sodium 135 - 145 mmol/L 133(L) 135 136  Potassium 3.5 - 5.1 mmol/L 4.5 3.9 4.0  Chloride 98 - 111 mmol/L 98 104 106  CO2 22 - 32 mmol/L _1 Calcium 8.9 - 10.3 mg/dL 9.7 9.0 9.0     Objective:   Vitals:   11/22/21 0154 11/22/21 0601  BP: 130/80 138/71  Pulse: 62 79  Resp: 15 18  Temp:    SpO2: 98% 96%    General:AA&O x 3. Normal mood and affect   Vascular: DP and PT pulses 2/4 bilateral. Brisk capillary refill to all digits. Pedal hair present   Neruological. Epicritic sensation grossly intact.   Derm: Left foot wound with fibrotic and granular base measuring 5 cm x 4 cm x 0.5 cm . Erythema noted to second digit and spreading to dorsal foot. No purulence. No probe to bone. Interspaces clears of maceration. Nails well groomed and normal in appearance       MSK: MMT 5/5 in dorsiflexion, plantar flexion, inversion and eversion. Normal  joint ROM without pain or crepitus.    LEFT FOOT - COMPLETE 3+ VIEW   COMPARISON:  None.   FINDINGS: Three view radiograph left foot demonstrates surgical changes of a first ray transmetatarsal amputation. There is a subacute intra-articular fracture of the base of second proximal phalanx with lateral dislocation of the a second MTP joint.z no other fracture or dislocation. Extensive soft tissue swelling involving the left forefoot. Superior and plantar calcaneal spurs are present. Vascular calcifications are seen.   IMPRESSION: Intra-articular fracture of the base of the second proximal phalanx with lateral dislocation of the second MTP joint.  Assessment & Plan:  Patient was evaluated and treated and all questions answered.  DX: Left foot wound s/p left great toe ampuation 10/19 and left I&D on 10/25.  Wound care: betadine, DSD  Antibiotics: Per primary  DME: Post-op shoe   Discussed with patient diagnosis and treatment options.  Imaging reviewed. Awaiting MRI. X-ray shows second proximal phalanx fracture likely in the setting of infection. Discussed with the  patient treatment options. Pending MRI. Discussed irrigation and debridement of the wound with wound vac placement and likely need for amputation of the second digit. Patient expressed understanding.  Will plan to take patient to OR tomorrow afternoon pending MRI.  Please keep NPO after midnight tonight.   Patient in agreement with plan and all questions answered.   Lorenda Peck, MD  Accessible via secure chat for questions or concerns.

## 2021-11-22 NOTE — Progress Notes (Addendum)
[facility] for Infectious Disease  Date of Admission:  11/21/2021   Total days of inpatient antibiotics 3  Principal Problem:   Diabetic foot infection ([place]) Active Problems:   Chronic diastolic heart failure (HCC)   Coronary artery disease involving native coronary artery of native heart with angina pectoris (HCC)   Essential hypertension   GERD (gastroesophageal reflux disease)   S/P CABG (coronary artery bypass graft)   Ventricular tachycardia   ICD (implantable cardioverter-defibrillator) in place   Hyperglycemia due to type 2 diabetes mellitus ([place])   Bacteremia      Assessment:    60 year old female with uncontrolled diabetes with A1c 12.8, diabetic left foot wounds status post I&D on 10/10/21 followed by podiatry, V. tach status post ICD implantation admitted for sepsis secondary to left foot wound.  ID is following due to MRSA bacteremia.   # MRSA Bacteremia 2/2 diabetic left foot wound SP left transmetatarsal amputation with Cx pending #ICD #Uncontrolled diabetes #Vancomycin allergy #Breast cellulitis SP I&D on 10/23/21 -SP 1st metatarsal revision on 05/02/21 with residual OR Cx+ staph aureus, treated with bactrim x 2 weeks. Suspect pt had residual OM and did not receive antibiotics past 2 weeks.  -I&D Cx of left foot wound from 10/10/21 are form deep wound, does not appear they are from bone. Treated with 2 weeks of antibiotics(augment/fluconazole x7 days). - MRI of left foot on 11/22/21 showed suspicion for osteomyelitis of 1st/2nd/3rd metatarsal with diffuse cellulitis and myofascitis. Pt went to OR on 11/23/21 for left transmetatarsal amputation. -Pt reports right breast erythema started at the same time as her foot worsened.  There is a note by Dr. Noberto Retort on 10/23/21([facility] in [place])where right breast wound was debrided (gangrenous skin/subcutaneous tissue). [place]  of breast on 12/7 no sign of abscess. Recommendations: -Continue  daptomycin   -Start cefepime as imaging showed cellulitis/myofascitis -Follow up TTE -Follow blood Cx form 12/7 to ensure clearance -Follow OR Cx Microbiology:   Antibiotics: Linezolid 12/06-p Pip-tazo 12/06  Cultures: Blood 12/06 + MRSA   12/8 OR Cx SUBJECTIVE:  Afebrile overnight. No new complaints. No significant overnight events.   Review of Systems: Review of Systems  All other systems reviewed and are negative.   Scheduled Meds:  amiodarone  200 mg Oral Daily   aspirin EC  81 mg Oral Daily   clopidogrel  75 mg Oral Daily   insulin aspart  0-15 Units Subcutaneous TID WC   insulin aspart  0-5 Units Subcutaneous QHS   insulin aspart  5 Units Subcutaneous TID WC   insulin glargine-yfgn  30 Units Subcutaneous Daily   rosuvastatin  20 mg Oral QHS   sodium chloride flush  3 mL Intravenous Q12H   Continuous Infusions:  DAPTOmycin (CUBICIN)  IV     PRN Meds:.acetaminophen **OR** acetaminophen, HYDROmorphone (DILAUDID) injection, polyethylene glycol Allergies  Allergen Reactions   Chlorhexidine Gluconate Itching    Received CHG bath, began itching, required benadryl   Cat Hair Extract Other (See Comments)    Sneezing, watery eyes.   Tramadol Other (See Comments)    Hallucinations   Vancomycin     Other reaction(s): Anaphylaxis*   Codeine Rash    OBJECTIVE: Vitals:   11/22/21 0154 11/22/21 0601 11/22/21 0912 11/22/21 1349  BP: 130/80 138/71 138/74 139/82  Pulse: 62 79 74 70  Resp: 15 18 16 17   Temp:   98.7 F (37.1 C)   TempSrc:   Oral   SpO2: 98%  96% 97% (!) 87%   There is no height or weight on file to calculate BMI.  Physical Exam    Lab Results Lab Results  Component Value Date   WBC 18.1 (H) 11/21/2021   HGB 10.7 (L) 11/21/2021   HCT 34.8 (L) 11/21/2021   MCV 86.1 11/21/2021   PLT 475 (H) 11/21/2021    Lab Results  Component Value Date   CREATININE 0.96 11/21/2021   BUN 16 11/21/2021   NA 133 (L) 11/21/2021   K 4.5 11/21/2021   CL 98 11/21/2021    CO2 25 11/21/2021    Lab Results  Component Value Date   ALT 19 11/21/2021   AST 21 11/21/2021   ALKPHOS 54 11/21/2021   BILITOT 0.7 11/21/2021        Laurice Record, [facility] for Infectious Disease [facility] 11/22/2021, 2:44 PM

## 2021-11-22 NOTE — ED Notes (Signed)
Breakfast orders placed 

## 2021-11-22 NOTE — Progress Notes (Signed)
PHARMACY - PHYSICIAN COMMUNICATION CRITICAL VALUE ALERT - BLOOD CULTURE IDENTIFICATION (BCID)  Jasmine Richardson is an 60 y.o. female who presented to [facility] on 11/21/2021 with a chief complaint of L-foot pain/drainage  Assessment: 45 YOF with L-foot infection s/p prior toe amputations and cellulitis with poor wound healing and increased draining associated with fever/chills. Now with 1 of 4 blood cultures growing MRSA.  Name of physician (or Provider) Contacted: Leanora Cover (automatic ID consult)  Current antibiotics: Zyvox + Zosyn  Changes to prescribed antibiotics recommended:  Adjust to Daptomycin 650 mg (~8 mg/kg AdjBW) every 24 hours  Results for orders placed or performed during the hospital encounter of 11/21/21  Blood Culture ID Panel (Reflexed) (Collected: 11/21/2021  6:28 PM)  Result Value Ref Range   Enterococcus faecalis NOT DETECTED NOT DETECTED   Enterococcus Faecium NOT DETECTED NOT DETECTED   Listeria monocytogenes NOT DETECTED NOT DETECTED   Staphylococcus species DETECTED (A) NOT DETECTED   Staphylococcus aureus (BCID) DETECTED (A) NOT DETECTED   Staphylococcus epidermidis NOT DETECTED NOT DETECTED   Staphylococcus lugdunensis NOT DETECTED NOT DETECTED   Streptococcus species NOT DETECTED NOT DETECTED   Streptococcus agalactiae NOT DETECTED NOT DETECTED   Streptococcus pneumoniae NOT DETECTED NOT DETECTED   Streptococcus pyogenes NOT DETECTED NOT DETECTED   A.calcoaceticus-baumannii NOT DETECTED NOT DETECTED   Bacteroides fragilis NOT DETECTED NOT DETECTED   Enterobacterales NOT DETECTED NOT DETECTED   Enterobacter cloacae complex NOT DETECTED NOT DETECTED   Escherichia coli NOT DETECTED NOT DETECTED   Klebsiella aerogenes NOT DETECTED NOT DETECTED   Klebsiella oxytoca NOT DETECTED NOT DETECTED   Klebsiella pneumoniae NOT DETECTED NOT DETECTED   Proteus species NOT DETECTED NOT DETECTED   Salmonella species NOT DETECTED NOT DETECTED   Serratia marcescens NOT  DETECTED NOT DETECTED   Haemophilus influenzae NOT DETECTED NOT DETECTED   Neisseria meningitidis NOT DETECTED NOT DETECTED   Pseudomonas aeruginosa NOT DETECTED NOT DETECTED   Stenotrophomonas maltophilia NOT DETECTED NOT DETECTED   Candida albicans NOT DETECTED NOT DETECTED   Candida auris NOT DETECTED NOT DETECTED   Candida glabrata NOT DETECTED NOT DETECTED   Candida krusei NOT DETECTED NOT DETECTED   Candida parapsilosis NOT DETECTED NOT DETECTED   Candida tropicalis NOT DETECTED NOT DETECTED   Cryptococcus neoformans/gattii NOT DETECTED NOT DETECTED   Meth resistant mecA/C and MREJ DETECTED (A) NOT DETECTED    Thank you for allowing pharmacy to be a part of this patient's care.  Alycia Rossetti, PharmD, BCPS Clinical Pharmacist 11/22/2021 1:59 PM   **Pharmacist phone directory can now be found on [email or web address] ([email or web address]).  Listed under [place].

## 2021-11-22 NOTE — ED Notes (Signed)
Pt has a 5cmx3cm wound on left inner foot near 1st digit of left foot. Bone is showing. The bedding of the wound is green and dark red. I applied a dry dressing to wound.

## 2021-11-22 NOTE — Telephone Encounter (Addendum)
Patient called very upset she is at [facility] hospital for infection in her foot.  They told her that you have been contacted, she would like a call back at your convenience .   I spoke to patient re: above and reminded her that I texted her that Dr. Blenda Mounts would see her today.

## 2021-11-22 NOTE — H&P (View-Only) (Signed)
Subjective:  [name], female    DOB: 27-Dec-1960,  MRN: [id number]  Patient with past medical history of anxiety, cellulitis, CHF, CAD, hypertension, COPD, GERD, diabetes, V. tach status post ICD implantation seen at beside today for worsening left foot wound. Patient had a left great toe amputation on 10/19 following that she underwent debridement on 10/25 with Dr. Cannon Kettle. Relates worsening pain and redness of the left foot.   Past Medical History:  Diagnosis Date   Acute chest pain 05/08/2016   Angina pectoris ([place]) 09/11/2017   Burping 05/08/2016   Chronic diastolic heart failure ([place]) 11/06/2016   Coronary artery disease involving native coronary artery of native heart with angina pectoris ([place]) 09/12/2015   Overview:  PCI and stent of RCA 2009, last cath 2012 with medical therapy  CABG May 2017   Emphysema lung ([place]) 09/11/2017   Essential hypertension 09/12/2015   GERD (gastroesophageal reflux disease) 05/08/2016   Hyperlipidemia 09/12/2015   Mediastinitis 06/28/2016   Morbid obesity ([place]) 05/08/2016   S/P CABG (coronary artery bypass graft) 06/03/2016   Overview:  The patient underwent sternal reconstruction on 06/21/16 with pec flaps for mediastinitis from a prior CABG in May 2017. On admission, she was critically ill from sepsis and had altered mental status. She was last seen in clinic on 08/09/16 at which time she was doing well.   Severe sepsis ([place]) 06/03/2016   Sinus tachycardia 05/08/2016   Tobacco use disorder 04/20/2016   Overview:  Quit in May 2017.   Uncontrolled type 2 diabetes mellitus 05/08/2016   Wound, surgical, infected 06/06/2016   Overview:  sternal     Past Surgical History:  Procedure Laterality Date   ABDOMINAL AORTOGRAM W/LOWER EXTREMITY N/A 10/11/2021   Procedure: ABDOMINAL AORTOGRAM W/LOWER EXTREMITY;  Surgeon: Marty Heck, MD;  Location: [facility];  Service: Cardiovascular;  Laterality: N/A;   AMPUTATION TOE Right 02/20/2020    Procedure: AMPUTATION RIGHT GREAT  TOE;  Surgeon: Felipa Furnace, DPM;  Location: [facility];  Service: Podiatry;  Laterality: Right;   BLADDER SURGERY     CARDIAC CATHETERIZATION     CORONARY ARTERY BYPASS GRAFT     CORONARY STENT INTERVENTION N/A 05/25/2020   Procedure: CORONARY STENT INTERVENTION;  Surgeon: Wellington Hampshire, MD;  Location: [facility];  Service: Cardiovascular;  Laterality: N/A;   CORONARY STENT INTERVENTION N/A 06/22/2020   Procedure: CORONARY STENT INTERVENTION;  Surgeon: Leonie Man, MD;  Location: [facility];  Service: Cardiovascular;  Laterality: N/A;   ICD IMPLANT N/A 12/19/2020   Procedure: ICD IMPLANT;  Surgeon: Evans Lance, MD;  Location: [facility];  Service: Cardiovascular;  Laterality: N/A;   INCISION AND DRAINAGE Right 02/19/2020   Procedure: INCISION AND DRAINAGE;  Surgeon: Trula Slade, DPM;  Location: [facility];  Service: Podiatry;  Laterality: Right;  Block done by surgeon   INTRAVASCULAR ULTRASOUND/IVUS N/A 06/22/2020   Procedure: Intravascular Ultrasound/IVUS;  Surgeon: Leonie Man, MD;  Location: [facility];  Service: Cardiovascular;  Laterality: N/A;   IRRIGATION AND DEBRIDEMENT FOOT Left 10/10/2021   Procedure: IRRIGATION AND DEBRIDEMENT FOOT;  Surgeon: Landis Martins, DPM;  Location: [facility];  Service: Podiatry;  Laterality: Left;   LEFT HEART CATH AND CORS/GRAFTS ANGIOGRAPHY N/A 05/23/2020   Procedure: LEFT HEART CATH AND CORS/GRAFTS ANGIOGRAPHY;  Surgeon: Nelva Bush, MD;  Location: [facility];  Service: Cardiovascular;  Laterality: N/A;   LEFT HEART CATH AND CORS/GRAFTS  ANGIOGRAPHY N/A 12/19/2020   Procedure: LEFT HEART CATH AND CORS/GRAFTS ANGIOGRAPHY;  Surgeon: Burnell Blanks, MD;  Location: [facility];  Service: Cardiovascular;  Laterality: N/A;   LEFT HEART CATH AND CORS/GRAFTS ANGIOGRAPHY N/A 07/21/2021   Procedure: LEFT HEART CATH AND CORS/GRAFTS ANGIOGRAPHY;  Surgeon: Nelva Bush,  MD;  Location: [facility];  Service: Cardiovascular;  Laterality: N/A;   METATARSAL HEAD EXCISION Right 05/02/2020   Procedure: FIRST METATARSAL HEAD RESECTION; RIGHT FOOT WOUND CLOSURE;  Surgeon: Landis Martins, DPM;  Location: [facility];  Service: Podiatry;  Laterality: Right;  MAC W/LOCAL   PERIPHERAL VASCULAR BALLOON ANGIOPLASTY Left 10/11/2021   Procedure: PERIPHERAL VASCULAR BALLOON ANGIOPLASTY;  Surgeon: Marty Heck, MD;  Location: [facility];  Service: Cardiovascular;  Laterality: Left;  Anterior Tibial Artery   TUBAL LIGATION      CBC Latest Ref Rng & Units 11/21/2021 10/14/2021 10/13/2021  WBC 4.0 - 10.5 K/uL 18.1(H) 11.2(H) 11.8(H)  Hemoglobin 12.0 - 15.0 g/dL 10.7(L) 9.0(L) 9.1(L)  Hematocrit 36.0 - 46.0 % 34.8(L) 28.8(L) 29.6(L)  Platelets 150 - 400 K/uL 475(H) 398 413(H)    BMP Latest Ref Rng & Units 11/21/2021 10/13/2021 10/12/2021  Glucose 70 - 99 mg/dL 272(H) 170(H) 142(H)  BUN 8 - 23 mg/dL _0 Creatinine 0.44 - 1.00 mg/dL 0.96 1.14(H) 0.98  BUN/Creat Ratio 12 - 28 - - -  Sodium 135 - 145 mmol/L 133(L) 135 136  Potassium 3.5 - 5.1 mmol/L 4.5 3.9 4.0  Chloride 98 - 111 mmol/L 98 104 106  CO2 22 - 32 mmol/L _1 Calcium 8.9 - 10.3 mg/dL 9.7 9.0 9.0     Objective:   Vitals:   11/22/21 0154 11/22/21 0601  BP: 130/80 138/71  Pulse: 62 79  Resp: 15 18  Temp:    SpO2: 98% 96%    General:AA&O x 3. Normal mood and affect   Vascular: DP and PT pulses 2/4 bilateral. Brisk capillary refill to all digits. Pedal hair present   Neruological. Epicritic sensation grossly intact.   Derm: Left foot wound with fibrotic and granular base measuring 5 cm x 4 cm x 0.5 cm . Erythema noted to second digit and spreading to dorsal foot. No purulence. No probe to bone. Interspaces clears of maceration. Nails well groomed and normal in appearance       MSK: MMT 5/5 in dorsiflexion, plantar flexion, inversion and eversion. Normal  joint ROM without pain or crepitus.    LEFT FOOT - COMPLETE 3+ VIEW   COMPARISON:  None.   FINDINGS: Three view radiograph left foot demonstrates surgical changes of a first ray transmetatarsal amputation. There is a subacute intra-articular fracture of the base of second proximal phalanx with lateral dislocation of the a second MTP joint.z no other fracture or dislocation. Extensive soft tissue swelling involving the left forefoot. Superior and plantar calcaneal spurs are present. Vascular calcifications are seen.   IMPRESSION: Intra-articular fracture of the base of the second proximal phalanx with lateral dislocation of the second MTP joint.  Assessment & Plan:  Patient was evaluated and treated and all questions answered.  DX: Left foot wound s/p left great toe ampuation 10/19 and left I&D on 10/25.  Wound care: betadine, DSD  Antibiotics: Per primary  DME: Post-op shoe   Discussed with patient diagnosis and treatment options.  Imaging reviewed. Awaiting MRI. X-ray shows second proximal phalanx fracture likely in the setting of infection. Discussed with the  patient treatment options. Pending MRI. Discussed irrigation and debridement of the wound with wound vac placement and likely need for amputation of the second digit. Patient expressed understanding.  Will plan to take patient to OR tomorrow afternoon pending MRI.  Please keep NPO after midnight tonight.   Patient in agreement with plan and all questions answered.   Lorenda Peck, MD  Accessible via secure chat for questions or concerns.

## 2021-11-22 NOTE — ED Notes (Signed)
Patient transported to MRI 

## 2021-11-23 ENCOUNTER — Inpatient Hospital Stay (HOSPITAL_COMMUNITY): Payer: Managed Care, Other (non HMO) | Admitting: Anesthesiology

## 2021-11-23 ENCOUNTER — Encounter (HOSPITAL_COMMUNITY): Payer: Self-pay | Admitting: Internal Medicine

## 2021-11-23 ENCOUNTER — Encounter (HOSPITAL_COMMUNITY)
Admission: EM | Disposition: A | Discharge status: Home Health | Payer: Self-pay | Source: Home / Self Care | Attending: Internal Medicine

## 2021-11-23 ENCOUNTER — Inpatient Hospital Stay (HOSPITAL_COMMUNITY): Payer: Managed Care, Other (non HMO)

## 2021-11-23 DIAGNOSIS — M86172 Other acute osteomyelitis, left ankle and foot: Secondary | ICD-10-CM

## 2021-11-23 DIAGNOSIS — R7881 Bacteremia: Secondary | ICD-10-CM

## 2021-11-23 HISTORY — PX: TRANSMETATARSAL AMPUTATION: SHX6197

## 2021-11-23 HISTORY — PX: IRRIGATION AND DEBRIDEMENT FOOT: SHX6602

## 2021-11-23 LAB — URINALYSIS, ROUTINE W REFLEX MICROSCOPIC
Bilirubin Urine: NEGATIVE
Glucose, UA: >=500 mg/dL — AB
Ketones, ur: NEGATIVE mg/dL
Nitrite: POSITIVE — AB
Protein, ur: >300 mg/dL — AB
Specific Gravity, Urine: 1.025 (ref 1.005–1.030)
pH: 6 (ref 5.0–8.0)

## 2021-11-23 LAB — SURGICAL PCR SCREEN
MRSA, PCR: NEGATIVE
Staphylococcus aureus: NEGATIVE

## 2021-11-23 LAB — BASIC METABOLIC PANEL
Anion gap: 10 (ref 5–15)
BUN: 12 mg/dL (ref 8–23)
CO2: 24 mmol/L (ref 22–32)
Calcium: 9.4 mg/dL (ref 8.9–10.3)
Chloride: 100 mmol/L (ref 98–111)
Creatinine, Ser: 1.04 mg/dL — ABNORMAL HIGH (ref 0.44–1.00)
GFR, Estimated: >60 mL/min (ref >60)
Glucose, Bld: 163 mg/dL — ABNORMAL HIGH (ref 70–99)
Potassium: 4.1 mmol/L (ref 3.5–5.1)
Sodium: 134 mmol/L — ABNORMAL LOW (ref 135–145)

## 2021-11-23 LAB — CBC
HCT: 33 % — ABNORMAL LOW (ref 36.0–46.0)
Hemoglobin: 10 g/dL — ABNORMAL LOW (ref 12.0–15.0)
MCH: 26.1 pg (ref 26.0–34.0)
MCHC: 30.3 g/dL (ref 30.0–36.0)
MCV: 86.2 fL (ref 80.0–100.0)
Platelets: 446 10*3/uL — ABNORMAL HIGH (ref 150–400)
RBC: 3.83 MIL/uL — ABNORMAL LOW (ref 3.87–5.11)
RDW: 14.6 % (ref 11.5–15.5)
WBC: 17.2 10*3/uL — ABNORMAL HIGH (ref 4.0–10.5)
nRBC: 0 % (ref 0.0–0.2)

## 2021-11-23 LAB — URINALYSIS, MICROSCOPIC (REFLEX): WBC, UA: >50 WBC/hpf (ref 0–5)

## 2021-11-23 LAB — GLUCOSE, CAPILLARY
Glucose-Capillary: 180 mg/dL — ABNORMAL HIGH (ref 70–99)
Glucose-Capillary: 181 mg/dL — ABNORMAL HIGH (ref 70–99)
Glucose-Capillary: 216 mg/dL — ABNORMAL HIGH (ref 70–99)
Glucose-Capillary: 190 mg/dL — ABNORMAL HIGH (ref 70–99)
Glucose-Capillary: 218 mg/dL — ABNORMAL HIGH (ref 70–99)

## 2021-11-23 LAB — HEMOGLOBIN A1C
Hgb A1c MFr Bld: 10.7 % — ABNORMAL HIGH (ref 4.8–5.6)
Mean Plasma Glucose: 260.39 mg/dL

## 2021-11-23 LAB — CK: Total CK: 16 U/L — ABNORMAL LOW (ref 38–234)

## 2021-11-23 LAB — ECHOCARDIOGRAM COMPLETE
Area-P 1/2: 5.31 cm2
S' Lateral: 2.4 cm

## 2021-11-23 SURGERY — IRRIGATION AND DEBRIDEMENT FOOT
Anesthesia: Monitor Anesthesia Care | Site: Foot | Laterality: Left

## 2021-11-23 SURGERY — IRRIGATION AND DEBRIDEMENT FOOT
Anesthesia: Monitor Anesthesia Care | Laterality: Left

## 2021-11-23 MED ORDER — CALCIUM CARBONATE ANTACID 500 MG PO CHEW
1.0000 | CHEWABLE_TABLET | Freq: Two times a day (BID) | ORAL | Status: DC | PRN
  Administered 2021-11-23 – 2021-11-26 (×4): 200 mg via ORAL
  Filled 2021-11-23 (×4): qty 1

## 2021-11-23 MED ORDER — FENTANYL CITRATE (PF) 100 MCG/2ML IJ SOLN: 25.0000 ug | INTRAMUSCULAR | Status: DC | PRN

## 2021-11-23 MED ORDER — PROPOFOL 500 MG/50ML IV EMUL
INTRAVENOUS | Status: DC | PRN
  Administered 2021-11-23: 100 ug/kg/min via INTRAVENOUS

## 2021-11-23 MED ORDER — SODIUM CHLORIDE 0.9 % IR SOLN
Status: DC | PRN
  Administered 2021-11-23: 3000 mL

## 2021-11-23 MED ORDER — ACETAMINOPHEN 500 MG PO TABS: 1000.0000 mg | ORAL_TABLET | Freq: Once | ORAL | Status: DC | PRN

## 2021-11-23 MED ORDER — METOPROLOL SUCCINATE ER 25 MG PO TB24
ORAL_TABLET | ORAL | Status: AC
  Administered 2021-11-23: 25 mg via ORAL
  Filled 2021-11-23: qty 1

## 2021-11-23 MED ORDER — FENTANYL CITRATE (PF) 250 MCG/5ML IJ SOLN
INTRAMUSCULAR | Status: AC
  Filled 2021-11-23: qty 5

## 2021-11-23 MED ORDER — METOPROLOL TARTRATE 25 MG PO TABS: 25.0000 mg | ORAL_TABLET | Freq: Once | ORAL | Status: DC

## 2021-11-23 MED ORDER — MIDAZOLAM HCL 2 MG/2ML IJ SOLN
INTRAMUSCULAR | Status: DC | PRN
  Administered 2021-11-23: 2 mg via INTRAVENOUS

## 2021-11-23 MED ORDER — LIDOCAINE HCL (PF) 2 % IJ SOLN
INTRAMUSCULAR | Status: DC | PRN
  Administered 2021-11-23: 100 mg via PERINEURAL

## 2021-11-23 MED ORDER — CEFAZOLIN SODIUM-DEXTROSE 2-3 GM-%(50ML) IV SOLR
INTRAVENOUS | Status: DC | PRN
  Administered 2021-11-23: 2 g via INTRAVENOUS

## 2021-11-23 MED ORDER — MIDAZOLAM HCL 2 MG/2ML IJ SOLN
INTRAMUSCULAR | Status: AC
  Filled 2021-11-23: qty 2

## 2021-11-23 MED ORDER — LACTATED RINGERS IV SOLN: INTRAVENOUS | Status: DC

## 2021-11-23 MED ORDER — INSULIN ASPART 100 UNIT/ML IJ SOLN
0.0000 [IU] | Freq: Three times a day (TID) | INTRAMUSCULAR | Status: DC
  Administered 2021-11-23 (×3): 3 [IU] via SUBCUTANEOUS
  Administered 2021-11-24: 17:00:00 8 [IU] via SUBCUTANEOUS
  Administered 2021-11-24: 13:00:00 5 [IU] via SUBCUTANEOUS
  Administered 2021-11-24: 07:00:00 3 [IU] via SUBCUTANEOUS
  Administered 2021-11-25: 12:00:00 11 [IU] via SUBCUTANEOUS
  Administered 2021-11-25: 06:00:00 8 [IU] via SUBCUTANEOUS
  Administered 2021-11-25 – 2021-11-26 (×2): 5 [IU] via SUBCUTANEOUS
  Administered 2021-11-26: 8 [IU] via SUBCUTANEOUS
  Administered 2021-11-26: 5 [IU] via SUBCUTANEOUS
  Administered 2021-11-27: 3 [IU] via SUBCUTANEOUS
  Administered 2021-11-27: 2 [IU] via SUBCUTANEOUS
  Administered 2021-11-27: 5 [IU] via SUBCUTANEOUS
  Administered 2021-11-28 (×2): 3 [IU] via SUBCUTANEOUS
  Administered 2021-11-28: 5 [IU] via SUBCUTANEOUS

## 2021-11-23 MED ORDER — PHENYLEPHRINE HCL-NACL 20-0.9 MG/250ML-% IV SOLN
INTRAVENOUS | Status: DC | PRN
  Administered 2021-11-23: 25 ug/min via INTRAVENOUS

## 2021-11-23 MED ORDER — METOPROLOL SUCCINATE ER 25 MG PO TB24
25.0000 mg | ORAL_TABLET | Freq: Once | ORAL | Status: AC
  Filled 2021-11-23: qty 1

## 2021-11-23 MED ORDER — SODIUM CHLORIDE 0.9 % IV SOLN: INTRAVENOUS | Status: DC | PRN

## 2021-11-23 MED ORDER — 0.9 % SODIUM CHLORIDE (POUR BTL) OPTIME
TOPICAL | Status: DC | PRN
  Administered 2021-11-23: 1000 mL

## 2021-11-23 MED ORDER — PERFLUTREN LIPID MICROSPHERE
1.0000 mL | INTRAVENOUS | Status: AC | PRN
Start: 2021-11-23 — End: 2021-11-23
  Administered 2021-11-23: 3 mL via INTRAVENOUS

## 2021-11-23 MED ORDER — ACETAMINOPHEN 160 MG/5ML PO SOLN: 1000.0000 mg | Freq: Once | ORAL | Status: DC | PRN

## 2021-11-23 MED ORDER — SODIUM CHLORIDE 0.9 % IV SOLN
2.0000 g | Freq: Three times a day (TID) | INTRAVENOUS | Status: DC
  Administered 2021-11-23 – 2021-11-27 (×12): 2 g via INTRAVENOUS
  Filled 2021-11-23 (×12): qty 2

## 2021-11-23 MED ORDER — BUPIVACAINE-EPINEPHRINE (PF) 0.5% -1:200000 IJ SOLN
INTRAMUSCULAR | Status: DC | PRN
  Administered 2021-11-23: 25 mL via PERINEURAL

## 2021-11-23 MED ORDER — ACETAMINOPHEN 10 MG/ML IV SOLN: 1000.0000 mg | Freq: Once | INTRAVENOUS | Status: DC | PRN

## 2021-11-23 MED ORDER — LIDOCAINE HCL 1 % IJ SOLN
INTRAMUSCULAR | Status: DC | PRN
  Administered 2021-11-23: .1 mL

## 2021-11-23 MED ORDER — FENTANYL CITRATE (PF) 250 MCG/5ML IJ SOLN
INTRAMUSCULAR | Status: DC | PRN
  Administered 2021-11-23: 50 ug via INTRAVENOUS
  Administered 2021-11-23: 25 ug via INTRAVENOUS

## 2021-11-23 MED ORDER — CEFAZOLIN SODIUM-DEXTROSE 2-4 GM/100ML-% IV SOLN
INTRAVENOUS | Status: AC
  Filled 2021-11-23: qty 100

## 2021-11-23 SURGICAL SUPPLY — 46 items
APL PRP STRL LF DISP 70% ISPRP (MISCELLANEOUS) ×1
BAG COUNTER SPONGE SURGICOUNT (BAG) ×2 IMPLANT
BAG SPNG CNTER NS LX DISP (BAG) ×1
BLADE AVERAGE 25X9 (BLADE) ×1 IMPLANT
BNDG CMPR 9X4 STRL LF SNTH (GAUZE/BANDAGES/DRESSINGS) ×1
BNDG ELASTIC 4X5.8 VLCR STR LF (GAUZE/BANDAGES/DRESSINGS) ×1 IMPLANT
BNDG ESMARK 4X9 LF (GAUZE/BANDAGES/DRESSINGS) ×1 IMPLANT
BNDG GAUZE ELAST 4 BULKY (GAUZE/BANDAGES/DRESSINGS) ×1 IMPLANT
CHLORAPREP W/TINT 26 (MISCELLANEOUS) ×2 IMPLANT
COVER SURGICAL LIGHT HANDLE (MISCELLANEOUS) ×2 IMPLANT
CUFF TOURN SGL QUICK 18X4 (TOURNIQUET CUFF) IMPLANT
CUFF TOURN SGL QUICK 34 (TOURNIQUET CUFF)
CUFF TRNQT CYL 34X4.125X (TOURNIQUET CUFF) IMPLANT
DRAIN JACKSON RD 7FR 3/32 (WOUND CARE) ×1 IMPLANT
DRAPE U-SHAPE 47X51 STRL (DRAPES) ×2 IMPLANT
DRSG ADAPTIC 3X8 NADH LF (GAUZE/BANDAGES/DRESSINGS) ×1 IMPLANT
ELECT CAUTERY BLADE 6.4 (BLADE) ×2 IMPLANT
ELECT REM PT RETURN 9FT ADLT (ELECTROSURGICAL) ×2
ELECTRODE REM PT RTRN 9FT ADLT (ELECTROSURGICAL) ×1 IMPLANT
GAUZE SPONGE 4X4 12PLY STRL (GAUZE/BANDAGES/DRESSINGS) ×1 IMPLANT
GLOVE SRG 8 PF TXTR STRL LF DI (GLOVE) ×1 IMPLANT
GLOVE SURG ENC MOIS LTX SZ7.5 (GLOVE) ×2 IMPLANT
GLOVE SURG UNDER POLY LF SZ8 (GLOVE) ×2
GOWN STRL REUS W/ TWL LRG LVL3 (GOWN DISPOSABLE) ×1 IMPLANT
GOWN STRL REUS W/ TWL XL LVL3 (GOWN DISPOSABLE) ×1 IMPLANT
GOWN STRL REUS W/TWL LRG LVL3 (GOWN DISPOSABLE) ×4
GOWN STRL REUS W/TWL XL LVL3 (GOWN DISPOSABLE)
KIT BASIN OR (CUSTOM PROCEDURE TRAY) ×2 IMPLANT
KIT TURNOVER KIT B (KITS) ×2 IMPLANT
MANIFOLD NEPTUNE II (INSTRUMENTS) ×2 IMPLANT
NDL BIOPSY JAMSHIDI 8X6 (NEEDLE) IMPLANT
NDL HYPO 25GX1X1/2 BEV (NEEDLE) IMPLANT
NEEDLE BIOPSY JAMSHIDI 8X6 (NEEDLE) IMPLANT
NEEDLE HYPO 25GX1X1/2 BEV (NEEDLE) ×2 IMPLANT
NS IRRIG 1000ML POUR BTL (IV SOLUTION) ×2 IMPLANT
PACK ORTHO EXTREMITY (CUSTOM PROCEDURE TRAY) ×2 IMPLANT
PAD ARMBOARD 7.5X6 YLW CONV (MISCELLANEOUS) ×4 IMPLANT
PROBE DEBRIDE SONICVAC MISONIX (TIP) IMPLANT
SET CYSTO W/LG BORE CLAMP LF (SET/KITS/TRAYS/PACK) ×2 IMPLANT
SOL PREP POV-IOD 4OZ 10% (MISCELLANEOUS) ×4 IMPLANT
SUT ETHILON 3 0 FSL (SUTURE) ×1 IMPLANT
SYR CONTROL 10ML LL (SYRINGE) IMPLANT
TOWEL GREEN STERILE (TOWEL DISPOSABLE) ×2 IMPLANT
TOWEL GREEN STERILE FF (TOWEL DISPOSABLE) ×2 IMPLANT
TUBE CONNECTING 12X1/4 (SUCTIONS) ×2 IMPLANT
YANKAUER SUCT BULB TIP NO VENT (SUCTIONS) ×2 IMPLANT

## 2021-11-23 NOTE — Progress Notes (Signed)
IV Team consulted for new PIV.  Current PIV in LUA with lots of tape all over the dressing ansd around the arm.  Pt verbalized so many needle sticks since admission.  LUE site dressing changed, site WNL, flushed x 3 in multiple positions with ease.  No new IV start needed.  Rn notified

## 2021-11-23 NOTE — Op Note (Signed)
OPERATIVE REPORT Patient name: Jasmine Richardson MRN: [id number] DOB: 07-Nov-1961  DOS:  11/23/21  Preop Dx: Left foot osteomyelitis  Postop Dx: same  Procedure:  1. Left transmetatarsal amputation   Surgeon: Lorenda Peck, DPM  Anesthesia: popliteal block   Hemostasis: Non needed.   EBL: 100 mL Materials: JP drain Injectables: none  Pathology: left foot residual to path  Condition: The patient tolerated the procedure and anesthesia well. No complications noted or reported   Justification for procedure: The patient is a 60 y.o. female who presents today for surgical correction of osteomylieits . All conservative modalities of been unsuccessful in providing any sort of satisfactory alleviation of symptoms with the patient. The patient was told benefits as well as possible side effects of the surgery. The patient consented for surgical correction. The patient consent form was reviewed. All patient questions were answered. No guarantees were expressed or implied. The patient and the surgeon both signed the patient consent form with the witness present and placed in the patient's chart.   Procedure in Detail: The patient was brought to the operating room, placed in the operating table in the supine position at which time an aseptic scrub and drape were performed about the patient's respective lower extremity after anesthesia was induced as described above. Attention was then directed to the surgical area where procedure number one commenced.  Procedure #1:    After suitable general anesthesia, the patient's left lower extremity, with a tourniquet cuff in place, was prepped and draped in the usual sterile fashion. Attention was directed to the operative extremity with visible site marking. Incision was planned with skin marker. Dorsal incision was then made with a 15 blade through skin and subcutaneous tissue down to bone. Electrocautery was used to ligate any bleeders. Incision was then  made along the plantar aspect of the foot careful to ligate any bleeders with electrocautery. Next, the bony cuts were made using sagittal saw, and these were made approximately at the mid level of the metatarsals and in a cascade fashion, transecting all of the 5 metatarsal bones. Next, the amputated forefoot was then removed. Following this, the wound was thoroughly irrigated and then clean area of bone was biopsied for culture. Hemostasis was then obtained After full hemostasis had been obtained, the wound was again thoroughly irrigated with pulse lavage and then the dorsal plantar flap was further tailored so that the plantar flap was lying in a dorsal direction. The skin  was closed with interrupted sutures using 3-0 Nylon  and staples  a vertical and horizontal mattress fashion. JP drain placed.. A adaptic and a bulky foot dressing were applied, and the patient awakened and transferred to the recovery room in a stable condition.  Dry sterile compressive dressings were then applied to all previously mentioned incision sites about the patient's lower extremity. The tourniquet which was used for hemostasis was deflated. All normal neurovascular responses including pink color and warmth returned all the digits of patient's lower extremity.  The patient was then transferred from the operating room to the recovery room having tolerated the procedure and anesthesia well. All vital signs are stable. Patient to keep the dressing clean dry and intact until follow-up with me in clinic. Patient will likely need 2 weeks of oral antibiotics following discharge. Will plan to pull drain tomorrow if minimal drainage. Continue IV antibiotics.   Lorenda Peck, DPM [facility]  Dr. Lorenda Peck, DPM    [address]  [place], [place] [address]                Office [phone]  Fax [phone]   

## 2021-11-23 NOTE — Progress Notes (Signed)
Inpatient Diabetes Program Recommendations  AACE/ADA: New Consensus Statement on Inpatient Glycemic Control (2015)  Target Ranges:  Prepandial:   less than 140 mg/dL      Peak postprandial:   less than 180 mg/dL (1-2 hours)      Critically ill patients:  140 - 180 mg/dL   Lab Results  Component Value Date   GLUCAP 180 (H) 11/23/2021   HGBA1C 10.7 (H) 11/23/2021    Review of Glycemic Control  Latest Reference Range & Units 11/22/21 18:02 11/22/21 22:17 11/23/21 06:15  Glucose-Capillary 70 - 99 mg/dL 253 (H) 194 (H) 180 (H)  (H): Data is abnormally high Diabetes history: Type 2 DM Outpatient Diabetes medication: Humalog 10-50 units TID, Tresiba 20 units QD, Metformin 500 mg BID Current orders for Inpatient glycemic control: Semglee 30 units QD, Novolog 5 units TID, Novolog 0-5 units QHs, Novolog 0-15 units TID  Inpatient Diabetes Program Recommendations:    Spoke with patient regarding outpatient diabetes management. Verified home dosages; adjusts the Novolog "based on what I eat and what my blood sugar is". Is not interested in outpatient endocrinology due to cost for copays.  Reviewed patient's current A1c of 10.7 which is down from 12.8%. Explained what a A1c is and what it measures. Also reviewed goal A1c with patient, importance of good glucose control @ home, and blood sugar goals. Reviewed role of pancreas, risk for recurrent infection, vascular changes and other commorbidities.  Patient has supplies and insulin. Reports checking blood sugars 3 times per day and that she has seen an improvement. Reviewed when to follow up with PCP and reviewed Freestyle libre with patient. Feel this may be a beneficial tool, even in the short term to better assess dosages. Patient wants to discuss with husband; secure chat sent to MD. Denies drinking sugary beverages. No further questions.   Thanks, Bronson Curb, MSN, RNC-OB Diabetes Coordinator [phone] (8a-5p)

## 2021-11-23 NOTE — ED Provider Notes (Signed)
[facility] Provider Note   CSN: [id number] Arrival date & time: 11/21/21  1755     History Chief Complaint  Patient presents with   Wound Check    Yajahira Tison is a 60 y.o. female.  Averly Ericson is a 60 y.o. female with hx of DM, HTN, HLD, CAD, CHF, PVD and recent  left great toe amputation, who presents with fever malaise and worsening pain and increased drainage from left foot wound. Pt had initial  left great toe amputation on 10.19 and has had prolonged difficulties with wound healing, had to have debridement on 10/25 and peripheral vascular balloon angioplasty. Despite continued wound care and outpatient antibiotics pt developed a fever today and reports worsening pain and redness to the left foot with foul smelling purulent drainage from the wound. Pt followed by Dr. Cannon Kettle with podiatry. Pt completed course of augmentin 2 days ago. No other aggravating or alleviating factors  The history is provided by the patient, the spouse and medical records.      Past Medical History:  Diagnosis Date   Acute chest pain 05/08/2016   Angina pectoris ([place]) 09/11/2017   Burping 05/08/2016   Chronic diastolic heart failure ([place]) 11/06/2016   Coronary artery disease involving native coronary artery of native heart with angina pectoris ([place]) 09/12/2015   Overview:  PCI and stent of RCA 2009, last cath 2012 with medical therapy  CABG May 2017   Emphysema lung ([place]) 09/11/2017   Essential hypertension 09/12/2015   GERD (gastroesophageal reflux disease) 05/08/2016   Hyperlipidemia 09/12/2015   Mediastinitis 06/28/2016   Morbid obesity ([place]) 05/08/2016   S/P CABG (coronary artery bypass graft) 06/03/2016   Overview:  The patient underwent sternal reconstruction on 06/21/16 with pec flaps for mediastinitis from a prior CABG in May 2017. On admission, she was critically ill from sepsis and had altered mental status. She was last seen in clinic on 08/09/16 at which time she was doing well.    Severe sepsis ([place]) 06/03/2016   Sinus tachycardia 05/08/2016   Tobacco use disorder 04/20/2016   Overview:  Quit in May 2017.   Uncontrolled type 2 diabetes mellitus 05/08/2016   Wound, surgical, infected 06/06/2016   Overview:  sternal    Patient Active Problem List   Diagnosis Date Noted   Bacteremia 11/22/2021   Diabetic foot infection ([place]) 11/21/2021   Abnormal stress test 10/23/2021   Abnormal EKG 10/23/2021   AKI (acute kidney injury) ([place]) 10/23/2021   Anxiety 10/23/2021   Cellulitis of right breast 10/23/2021   Gangrene ([place]) 10/23/2021   Hematoma 10/23/2021   Hiatal hernia 10/23/2021   Hyperglycemia 10/23/2021   Hyperglycemia due to type 2 diabetes mellitus ([place]) 10/23/2021   Hyponatremia 10/23/2021   Infection of great toe 10/23/2021   Lactic acidosis 10/23/2021   Leukocytosis 10/23/2021   Obesity (BMI 30-39.9) 10/23/2021   Perineal abscess 10/23/2021   Cellulitis, wound, post-operative 10/09/2021   Abnormal mammogram of left breast 09/04/2021   Breast wound, right, subsequent encounter 09/04/2021   Demand ischemia [facility])    ICD (implantable cardioverter-defibrillator) in place 05/16/2021   Ventricular tachycardia 12/17/2020   Unstable angina ([place]) 05/22/2020   NSTEMI (non-ST elevated myocardial infarction) ([place]) 05/22/2020   Diabetic foot ulcer ([place]) 02/16/2020   Angina pectoris ([place]) 09/11/2017   Emphysema lung ([place]) 09/11/2017   Chronic diastolic heart failure ([place]) 11/06/2016   Mediastinitis 06/28/2016   Wound, surgical, infected 06/06/2016   S/P CABG (coronary artery bypass graft) 06/03/2016  Severe sepsis ([place]) 06/03/2016   GERD (gastroesophageal reflux disease) 05/08/2016   Morbid obesity ([place]) 05/08/2016   Type 2 diabetes mellitus with foot ulcer ([place]) 05/08/2016   Sinus tachycardia 05/08/2016   Acute chest pain 05/08/2016   Burping 05/08/2016   Tobacco use disorder 04/20/2016   Coronary artery disease involving native coronary artery of native  heart with angina pectoris ([place]) 09/12/2015   Essential hypertension 09/12/2015   Hyperlipidemia 09/12/2015    Past Surgical History:  Procedure Laterality Date   ABDOMINAL AORTOGRAM W/LOWER EXTREMITY N/A 10/11/2021   Procedure: ABDOMINAL AORTOGRAM W/LOWER EXTREMITY;  Surgeon: Marty Heck, MD;  Location: [facility];  Service: Cardiovascular;  Laterality: N/A;   AMPUTATION TOE Right 02/20/2020   Procedure: AMPUTATION RIGHT GREAT  TOE;  Surgeon: Felipa Furnace, DPM;  Location: [facility];  Service: Podiatry;  Laterality: Right;   BLADDER SURGERY     CARDIAC CATHETERIZATION     CORONARY ARTERY BYPASS GRAFT     CORONARY STENT INTERVENTION N/A 05/25/2020   Procedure: CORONARY STENT INTERVENTION;  Surgeon: Wellington Hampshire, MD;  Location: [facility];  Service: Cardiovascular;  Laterality: N/A;   CORONARY STENT INTERVENTION N/A 06/22/2020   Procedure: CORONARY STENT INTERVENTION;  Surgeon: Leonie Man, MD;  Location: [facility];  Service: Cardiovascular;  Laterality: N/A;   ICD IMPLANT N/A 12/19/2020   Procedure: ICD IMPLANT;  Surgeon: Evans Lance, MD;  Location: [facility];  Service: Cardiovascular;  Laterality: N/A;   INCISION AND DRAINAGE Right 02/19/2020   Procedure: INCISION AND DRAINAGE;  Surgeon: Trula Slade, DPM;  Location: [facility];  Service: Podiatry;  Laterality: Right;  Block done by surgeon   INTRAVASCULAR ULTRASOUND/IVUS N/A 06/22/2020   Procedure: Intravascular Ultrasound/IVUS;  Surgeon: Leonie Man, MD;  Location: [facility];  Service: Cardiovascular;  Laterality: N/A;   IRRIGATION AND DEBRIDEMENT FOOT Left 10/10/2021   Procedure: IRRIGATION AND DEBRIDEMENT FOOT;  Surgeon: Landis Martins, DPM;  Location: [facility];  Service: Podiatry;  Laterality: Left;   LEFT HEART CATH AND CORS/GRAFTS ANGIOGRAPHY N/A 05/23/2020   Procedure: LEFT HEART CATH AND CORS/GRAFTS ANGIOGRAPHY;  Surgeon: Nelva Bush, MD;  Location: [facility];   Service: Cardiovascular;  Laterality: N/A;   LEFT HEART CATH AND CORS/GRAFTS ANGIOGRAPHY N/A 12/19/2020   Procedure: LEFT HEART CATH AND CORS/GRAFTS ANGIOGRAPHY;  Surgeon: Burnell Blanks, MD;  Location: [facility];  Service: Cardiovascular;  Laterality: N/A;   LEFT HEART CATH AND CORS/GRAFTS ANGIOGRAPHY N/A 07/21/2021   Procedure: LEFT HEART CATH AND CORS/GRAFTS ANGIOGRAPHY;  Surgeon: Nelva Bush, MD;  Location: [facility];  Service: Cardiovascular;  Laterality: N/A;   METATARSAL HEAD EXCISION Right 05/02/2020   Procedure: FIRST METATARSAL HEAD RESECTION; RIGHT FOOT WOUND CLOSURE;  Surgeon: Landis Martins, DPM;  Location: [facility];  Service: Podiatry;  Laterality: Right;  MAC W/LOCAL   PERIPHERAL VASCULAR BALLOON ANGIOPLASTY Left 10/11/2021   Procedure: PERIPHERAL VASCULAR BALLOON ANGIOPLASTY;  Surgeon: Marty Heck, MD;  Location: [facility];  Service: Cardiovascular;  Laterality: Left;  Anterior Tibial Artery   TUBAL LIGATION       OB History   No obstetric history on file.     Family History  Problem Relation Age of Onset   Hypertension Mother    Hyperlipidemia Mother    Heart attack Father    Heart disease Father    Hypertension Father    Alzheimer's disease  Father    Hypertension Brother    Hyperlipidemia Brother     Social History   Tobacco Use   Smoking status: Former    Types: Cigarettes    Quit date: 05/2016    Years since quitting: 5.5   Smokeless tobacco: Never  Vaping Use   Vaping Use: Never used  Substance Use Topics   Alcohol use: No   Drug use: No    Home Medications Prior to Admission medications   Medication Sig Start Date End Date Taking? Authorizing Provider  acetaminophen (TYLENOL) 325 MG tablet Take 2 tablets (650 mg total) by mouth every 4 (four) hours as needed for headache or mild pain. 10/14/21  Yes Nita Sells, MD  amiodarone (PACERONE) 200 MG tablet Take 1 tablet (200 mg  total) by mouth daily. 07/22/21 11/22/21 Yes Timothy Lasso, MD  aspirin EC 81 MG tablet Take 81 mg by mouth daily. Swallow whole.   Yes [provider]  cholecalciferol (VITAMIN D3) 25 MCG (1000 UNIT) tablet Take 1,000 Units by mouth 2 (two) times a week.   Yes [provider]  clopidogrel (PLAVIX) 75 MG tablet Take 75 mg by mouth daily.   Yes [provider]  cyanocobalamin 1000 MCG tablet Take 1,000 mcg by mouth daily.   Yes [provider]  dapagliflozin propanediol (FARXIGA) 10 MG TABS tablet Take 10 mg by mouth daily.   Yes [provider]  fenofibrate (TRICOR) 145 MG tablet Take 1 tablet (145 mg total) by mouth daily. 07/22/21 11/22/21 Yes Timothy Lasso, MD  furosemide (LASIX) 20 MG tablet Take 20 mg by mouth daily. 10/07/21  Yes [provider]  HUMALOG KWIKPEN 100 UNIT/ML KwikPen Inject 10-50 Units into the skin at bedtime. Sliding scale 09/09/21  Yes [provider]  HYDROmorphone (DILAUDID) 2 MG tablet Take 1 tablet (2 mg total) by mouth every 6 (six) hours as needed for up to 5 days for severe pain or moderate pain. 11/21/21 11/26/21 Yes Stover, Titorya, DPM  isosorbide mononitrate (IMDUR) 30 MG 24 hr tablet Take 1 tablet (30 mg total) by mouth daily. 10/14/21 11/22/21 Yes Nita Sells, MD  metoprolol succinate (TOPROL XL) 25 MG 24 hr tablet Take 1 tablet (25 mg total) by mouth 3 (three) times daily. 10/14/21 11/22/21 Yes Nita Sells, MD  nitroGLYCERIN (NITROSTAT) 0.4 MG SL tablet Place 1 tablet (0.4 mg total) under the tongue every 5 (five) minutes as needed for chest pain. 07/22/21 11/22/21 Yes Timothy Lasso, MD  nystatin (MYCOSTATIN/NYSTOP) powder Apply 1 application topically daily as needed (rash/yeast).   Yes [provider]  omeprazole (PRILOSEC) 20 MG capsule Take 20 mg by mouth daily.   Yes [provider]  rosuvastatin (CRESTOR) 20 MG tablet Take 1 tablet (20 mg total) by mouth at  bedtime. 07/22/21 11/22/21 Yes Timothy Lasso, MD  TRESIBA FLEXTOUCH 100 UNIT/ML FlexTouch Pen Inject 20 Units into the skin daily. 10/14/21 01/12/22 Yes Nita Sells, MD  valsartan (DIOVAN) 80 MG tablet Take 1 tablet (80 mg total) by mouth 2 (two) times daily. 10/14/21  Yes Nita Sells, MD  vitamin C (ASCORBIC ACID) 250 MG tablet Take 250 mg by mouth at bedtime.   Yes [provider]  Vitamin D, Ergocalciferol, (DRISDOL) 1.25 MG (50000 UNIT) CAPS capsule Take 50,000 Units by mouth every Saturday.   Yes [provider]  Evolocumab (REPATHA SURECLICK) 431 MG/ML SOAJ Inject 140 mg into the skin every 14 (fourteen) days. Patient not taking: Reported on 11/22/2021 08/23/21  Richardo Priest, MD  metFORMIN (GLUCOPHAGE) 500 MG tablet Take 1 tablet (500 mg total) by mouth 2 (two) times daily. Patient not taking: Reported on 11/22/2021 07/22/21 11/22/21  Timothy Lasso, MD    Allergies    Chlorhexidine gluconate, Cat hair extract, Tramadol, Vancomycin, and Codeine  Review of Systems   Review of Systems  Constitutional:  Positive for chills and fever.  HENT: Negative.    Respiratory:  Negative for cough and shortness of breath.   Cardiovascular:  Negative for chest pain.  Gastrointestinal:  Negative for abdominal pain, nausea and vomiting.  Genitourinary:  Negative for dysuria.  Musculoskeletal:  Positive for arthralgias.  Skin:  Positive for color change and wound.  Neurological:  Negative for weakness and numbness.  All other systems reviewed and are negative.  Physical Exam Updated Vital Signs BP (!) 151/69 (BP Location: Right Arm)   Pulse 88   Temp (!) 100.5 F (38.1 C) (Oral)   Resp 20   LMP  (LMP Unknown)   SpO2 96%   Physical Exam Vitals and nursing note reviewed.  Constitutional:      General: She is not in acute distress.    Appearance: Normal appearance. She is well-developed. She is not diaphoretic.  HENT:     Head: Normocephalic and  atraumatic.  Eyes:     General:        Right eye: No discharge.        Left eye: No discharge.     Pupils: Pupils are equal, round, and reactive to light.  Cardiovascular:     Rate and Rhythm: Normal rate and regular rhythm.     Pulses: Normal pulses.     Heart sounds: Normal heart sounds.  Pulmonary:     Effort: Pulmonary effort is normal. No respiratory distress.     Breath sounds: Normal breath sounds. No wheezing or rales.     Comments: Respirations equal and unlabored, patient able to speak in full sentences, lungs clear to auscultation bilaterally  Abdominal:     General: Bowel sounds are normal. There is no distension.     Palpations: Abdomen is soft. There is no mass.     Tenderness: There is no abdominal tenderness. There is no guarding.     Comments: Abdomen soft, nondistended, nontender to palpation in all quadrants without guarding or peritoneal signs  Musculoskeletal:     Cervical back: Neck supple.     Comments: Wound from left great toe amputation as pictured below, foul smelling purulent drainage, tender to palpation with redness extending over the foot, no palpable crepitus, DP pulse 1+, sensation intact  Skin:    General: Skin is warm and dry.     Capillary Refill: Capillary refill takes less than 2 seconds.     Findings: Erythema present.  Neurological:     Mental Status: She is alert and oriented to person, place, and time.     Coordination: Coordination normal.     Comments: Speech is clear, able to follow commands CN III-XII intact Normal strength in upper and lower extremities bilaterally including dorsiflexion and plantar flexion, strong and equal grip strength Sensation normal to light and sharp touch Moves extremities without ataxia, coordination intact  Psychiatric:        Mood and Affect: Mood normal.        Behavior: Behavior normal.       ED Results / Procedures / Treatments   Labs (all labs ordered are listed, but only abnormal results are  displayed) Labs Reviewed  BLOOD CULTURE  CBC WITH DIFFERENTIAL/PLATELET - Abnormal; Notable for the following components:   WBC 18.1 (*)    Hemoglobin 10.7 (*)    HCT 34.8 (*)    Platelets 475 (*)    Neutro Abs 15.3 (*)    Monocytes Absolute 1.2 (*)    Abs Immature Granulocytes 0.12 (*)    All other components within normal limits  COMPREHENSIVE METABOLIC PANEL - Abnormal; Notable for the following components:   Sodium 133 (*)    Glucose, Bld 272 (*)    Total Protein 8.2 (*)    Albumin 3.1 (*)    All other components within normal limits  GLUCOSE, CAPILLARY - Abnormal; Notable for the following components:   Glucose-Capillary 194 (*)    All other components within normal limits  CBG MONITORING, ED - Abnormal; Notable for the following components:   Glucose-Capillary 274 (*)    All other components within normal limits  CBG MONITORING, ED - Abnormal; Notable for the following components:   Glucose-Capillary 297 (*)    All other components within normal limits  CBG MONITORING, ED - Abnormal; Notable for the following components:   Glucose-Capillary 317 (*)    All other components within normal limits  CBG MONITORING, ED - Abnormal; Notable for the following components:   Glucose-Capillary 253 (*)    All other components within normal limits  RESP PANEL BY RT-PCR (FLU A&B, COVID) ARPGX2  CULTURE, BLOOD (ROUTINE X 2)  CULTURE, BLOOD (ROUTINE X 2)  AEROBIC/ANAEROBIC CULTURE W GRAM STAIN (SURGICAL/DEEP WOUND)  SURGICAL PCR SCREEN  LIPASE, BLOOD  LACTIC ACID, PLASMA  URINALYSIS, ROUTINE W REFLEX MICROSCOPIC  LACTIC ACID, PLASMA  COMPREHENSIVE METABOLIC PANEL  CBC  CK  HEMOGLOBIN A1C    EKG None  Radiology  DG Foot Complete Left  Result Date: 11/21/2021 CLINICAL DATA:  Left foot pain EXAM: LEFT FOOT - COMPLETE 3+ VIEW COMPARISON:  None. FINDINGS: Three view radiograph left foot demonstrates surgical changes of a first ray transmetatarsal amputation. There is a subacute  intra-articular fracture of the base of second proximal phalanx with lateral dislocation of the a second MTP joint.z no other fracture or dislocation. Extensive soft tissue swelling involving the left forefoot. Superior and plantar calcaneal spurs are present. Vascular calcifications are seen. IMPRESSION: Intra-articular fracture of the base of the second proximal phalanx with lateral dislocation of the second MTP joint. Electronically Signed   By: Fidela Salisbury M.D.   On: 11/21/2021 19:05   Procedures .Critical Care Performed by: Jacqlyn Larsen, PA-C Authorized by: Jacqlyn Larsen, PA-C   Critical care provider statement:    Critical care time (minutes):  30   Critical care was necessary to treat or prevent imminent or life-threatening deterioration of the following conditions:  Sepsis   Critical care was time spent personally by me on the following activities:  Development of treatment plan with patient or surrogate, discussions with consultants, evaluation of patient's response to treatment, examination of patient, ordering and review of laboratory studies, ordering and review of radiographic studies, ordering and performing treatments and interventions, pulse oximetry, re-evaluation of patient's condition and review of old charts   Medications Ordered in ED Medications  acetaminophen (TYLENOL) tablet 650 mg (650 mg Oral Given 11/21/21 1819)  piperacillin-tazobactam (ZOSYN) IVPB 3.375 g (0 g Intravenous Stopped 11/22/21 0332)  linezolid (ZYVOX) IVPB 600 mg (0 mg Intravenous Stopped 11/22/21 0332)    ED Course  I have reviewed the triage vital signs  and the nursing notes.  Pertinent labs & imaging results that were available during my care of the patient were reviewed by me and considered in my medical decision making (see chart for details).    MDM Rules/Calculators/A&P                           Pt presents with fevers and worsening pain redness and drainage from left great tow  amputation wound. Initial amputation performed on 10/19, with continued difficulties with wound healing. Febrile on arrival with redness and purulent drainage from the wound. Labs and x-ray of the foot ordered from triage.  I have independently ordered, reviewed and interpreted all labs and imaging: CBC: leukocytosis of 18.1, stable Hgb CMP:  glucose 272, no other significant electrolyte derangements Lactic: WNL Blood cultures pending  Left Foot XR: Intra-articular fracture of the base of the second proximal phalanx with lateral dislocation of the second MTP joint, no soft tissue case of evidence of osteomyelitis  Pt meets sepsis criteria, will require admission for IV antibiotics, discussed with pharmacy, recommend linezolid, due to vanc allergy and zosyn.   Case discussed with Dr. Cannon Kettle, patient's podiatrist, she agrees with plan for admission and IV antibiotics, requests MRI of the left foot and will have her colleague Dr. Blenda Mounts see pt in consult.  Case discussed with Dr. Trilby Drummer with [facility] who will see and admit the patient.  Final Clinical Impression(s) / ED Diagnoses Final diagnoses:  Cellulitis  Post op infection  Diabetic foot infection [facility])    Rx / DC Orders ED Discharge Orders     None        Janet Berlin 11/24/21 1401    Valarie Merino, MD 11/25/21 2252

## 2021-11-23 NOTE — Anesthesia Preprocedure Evaluation (Signed)
Anesthesia Evaluation  Patient identified by MRN, date of birth, ID band Patient awake    Reviewed: Allergy & Precautions, NPO status , Patient's Chart, lab work & pertinent test results, reviewed documented beta blocker date and time   History of Anesthesia Complications Negative for: history of anesthetic complications  Airway Mallampati: III  TM Distance: >3 FB Neck ROM: Full    Dental  (+) Edentulous Upper, Edentulous Lower   Pulmonary COPD, former smoker,    breath sounds clear to auscultation       Cardiovascular hypertension, Pt. on medications and Pt. on home beta blockers + angina + CAD, + Past MI and + Peripheral Vascular Disease   Rhythm:Regular  1. Left ventricular ejection fraction, by estimation, is 45 to 50%. The  left ventricle has mildly decreased function. The left ventricle  demonstrates regional wall motion abnormalities (see scoring  diagram/findings for description). Left ventricular  diastolic parameters are indeterminate.  2. Right ventricular systolic function is normal. The right ventricular  size is normal. Tricuspid regurgitation signal is inadequate for assessing  PA pressure.  3. The mitral valve is grossly normal. Trivial mitral valve  regurgitation.  4. The aortic valve is tricuspid. There is moderate calcification of the  aortic valve. Aortic valve regurgitation is not visualized.  5. The inferior vena cava is normal in size with greater than 50%  respiratory variability, suggesting right atrial pressure of 3 mmHg.   FINDINGS  Left Ventricle: Left ventricular ejection fraction, by estimation, is 45  to 50%. The left ventricle has mildly decreased function. The left  ventricle demonstrates regional wall motion abnormalities. Definity  contrast agent was given IV to delineate the  left ventricular endocardial borders. The left ventricular internal cavity  size was normal in size. There is  borderline left ventricular hypertrophy.  Left ventricular diastolic parameters are indeterminate.   1. Severe native coronary artery disease, as detailed below, not significantly changed from prior catheterization in 12/2020. 2. Widely patent LIMA-LAD graft and LMCA stent. 3. Patent mid RCA stents with moderate restenosis (~40%). 4. Moderately reduced left ventricular systolic function (LVEF 85-88%). 5. Upper normal left ventricular filling pressure (LVEDP 15 mmHg).  Recommendations: 1. Escalate antianginal therapy; no focal targets again noted on today's catheterization. 2. Aggressive secondary prevention.  Nelva Bush, MD [facility]     Neuro/Psych PSYCHIATRIC DISORDERS Anxiety negative neurological ROS     GI/Hepatic Neg liver ROS, hiatal hernia, GERD  ,  Endo/Other  diabetes, Poorly ControlledLab Results      Component                Value               Date                      HGBA1C                   10.7 (H)            11/23/2021             Renal/GU Renal diseaseLab Results      Component                Value               Date                      CREATININE  1.04 (H)            11/23/2021                Musculoskeletal Left foot wound infection/osteomyelitis   Abdominal   Peds  Hematology  (+) Blood dyscrasia, anemia , Lab Results      Component                Value               Date                      WBC                      17.2 (H)            11/23/2021                HGB                      10.0 (L)            11/23/2021                HCT                      33.0 (L)            11/23/2021                MCV                      86.2                11/23/2021                PLT                      446 (H)             11/23/2021              Anesthesia Other Findings thrush  Reproductive/Obstetrics                             Anesthesia Physical Anesthesia Plan  ASA: 4  Anesthesia Plan: MAC  and Regional   Post-op Pain Management:    Induction: Intravenous  PONV Risk Score and Plan: 2 and Propofol infusion and Treatment may vary due to age or medical condition  Airway Management Planned: Nasal Cannula  Additional Equipment: None  Intra-op Plan:   Post-operative Plan:   Informed Consent: I have reviewed the patients History and Physical, chart, labs and discussed the procedure including the risks, benefits and alternatives for the proposed anesthesia with the patient or authorized representative who has indicated his/her understanding and acceptance.     Dental advisory given  Plan Discussed with: CRNA and Anesthesiologist  Anesthesia Plan Comments:         Anesthesia Quick Evaluation

## 2021-11-23 NOTE — Transfer of Care (Signed)
Immediate Anesthesia Transfer of Care Note  Patient: Jasmine Richardson  Procedure(s) Performed: IRRIGATION AND DEBRIDEMENT FOOT (Left: Foot) TRANSMETATARSAL AMPUTATION (Left: Foot)  Patient Location: PACU  Anesthesia Type:MAC and Regional  Level of Consciousness: drowsy  Airway & Oxygen Therapy: Patient Spontanous Breathing and Patient connected to face mask oxygen  Post-op Assessment: Report given to RN and Post -op Vital signs reviewed and stable  Post vital signs: Reviewed and stable  Last Vitals:  Vitals Value Taken Time  BP 99/55 11/23/21 1346  Temp    Pulse 80 11/23/21 1347  Resp 15 11/23/21 1347  SpO2 100 % 11/23/21 1347  Vitals shown include unvalidated device data.  Last Pain:  Vitals:   11/23/21 1147  TempSrc: Oral  PainSc:          Complications: No notable events documented.

## 2021-11-23 NOTE — Interval H&P Note (Signed)
History and Physical Interval Note:  11/23/2021 12:01 PM  Jasmine Richardson  has presented today for surgery, with the diagnosis of Left foot wound infection/osteomyelitis.  The various methods of treatment have been discussed with the patient and family. After consideration of risks, benefits and other options for treatment, the patient has consented to  Procedure(s) with comments: IRRIGATION AND DEBRIDEMENT FOOT, possible second digit amputation (Left) - will do local block as a surgical intervention.  The patient's history has been reviewed, patient examined, no change in status, stable for surgery.  I have reviewed the patient's chart and labs.  Questions were answered to the patient's satisfaction.     Lorenda Peck

## 2021-11-23 NOTE — Anesthesia Procedure Notes (Signed)
Anesthesia Regional Block: Popliteal block   Pre-Anesthetic Checklist: , timeout performed,  Correct Patient, Correct Site, Correct Laterality,  Correct Procedure, Correct Position, site marked,  Risks and benefits discussed,  Surgical consent,  Pre-op evaluation,  At surgeon's request and post-op pain management  Laterality: Left and Lower  Prep: chloraprep       Needles:  Injection technique: Single-shot      Needle Length: 9cm  Needle Gauge: 22     Additional Needles: Arrow StimuQuik ECHO Echogenic Stimulating PNB Needle  Procedures:,,,, ultrasound used (permanent image in chart),,    Narrative:  Start time: 11/23/2021 12:48 PM End time: 11/23/2021 12:58 PM Injection made incrementally with aspirations every 5 mL.  Performed by: Personally  Anesthesiologist: Oleta Mouse, MD

## 2021-11-23 NOTE — Progress Notes (Signed)
  Echocardiogram 2D Echocardiogram has been performed.  Jasmine Richardson 11/23/2021, 4:05 PM

## 2021-11-23 NOTE — Progress Notes (Addendum)
PROGRESS NOTE    Jasmine Richardson  OAC:[id number] DOB: 01/04/1961 DOA: 11/21/2021 PCP: Nicholos Johns, MD  Brief Narrative: 60/F with history of CAD, COPD, chronic systolic/diastolic CHF, type 2 diabetes mellitus, anxiety, depression, history of V. tach, ICD presented to the ED with worsening left foot pain. -Recent left great toe amputation in October, followed by admission for I&D. -In the emergency room she was febrile to 100.9, labs noted WBC of 18, foot x-ray noted fracture base of left second phalanx with displacement -Started on linezolid and Zosyn   Assessment & Plan:   Sepsis, POA Left diabetic foot infection, recent toe amputation and debridement Osteomyelitis of toes, metatarsals MRSA bacteremia -Continue IV daptomycin -Infectious disease consulting, FU repeat blood cultures, TTE pending -Podiatry following, MRI foot noted findings suspicious for osteomyelitis involving first metatarsal, second and third metatarsal heads and the second and third proximal phalanges, myositis, no abscess -for OR today, debridement, cultures and likely second toe amputation -Follow-up OR cultures  PAD -Recently underwent aortogram, left anterior tibial artery angioplasty by Dr. Carlis Abbott on 10/26 -Continue aspirin Plavix statin  Type 2 diabetes mellitus -Continue Semglee 30 units, CBGs poorly controlled, now on NovoLog with meals, sliding scale insulin -Hemoglobin A1c is 10.7  Asymptomatic pyuria -Urinalysis abnormal with positive nitrite, small leukocytes, > 50 WBCs, patient denies any symptoms whatsoever, she is awake and alert, on antibiotics for above -Monitor clinically  Chronic systolic CHF CAD Hypertension -Clinically euvolemic at this time, continue home regimen of aspirin, Plavix,  metoprolol, Lasix -holding valsartan, imdur, lasix  History of V. Tach s/p AICD -Continue amiodarone  History of gangrenous skin/subcutaneous tissue of right breast -s/p excisional debridement by Dr.  Leonidas Romberg 11/7 -Now with large scab, no surrounding  tenderness erythema or warmth  DVT prophylaxis: SCDs Code Status: Full code Family Communication: Discussed with patient in detail, no family at bedside Disposition Plan:  Status is:  inpatient because: Severity of illness  Consultants:  Podiatry, infectious disease  Procedures:   Antimicrobials:    Subjective: -Feels okay, some discomfort in her low foot, denies any urinary symptoms whatsoever, no pelvic discomfort  Objective: Vitals:   11/23/21 0432 11/23/21 0801 11/23/21 1055 11/23/21 1147  BP: (!) 149/90 130/69 (!) 142/67 (!) 153/74  Pulse: (!) 103 91 88 86  Resp: 20 18 17 18   Temp: 99.2 F (37.3 C) 99 F (37.2 C) 99 F (37.2 C) 99.1 F (37.3 C)  TempSrc: Oral Oral Oral Oral  SpO2: 97% 96% 95% 97%  Weight: 107.6 kg   104.8 kg  Height:    5\' 7"  (1.702 m)    Intake/Output Summary (Last 24 hours) at 11/23/2021 1341 Last data filed at 11/23/2021 1335 Gross per 24 hour  Intake 650.98 ml  Output 550 ml  Net 100.98 ml   Filed Weights   11/23/21 0432 11/23/21 1147  Weight: 107.6 kg 104.8 kg    Examination:  General exam: Pleasant obese female sitting up in bed, AAOx3, no distress HEENT: No JVD CVS: S1-S2, regular rate rhythm Chest: Healing large scab overlying inner quadrant left breast Lungs: Decreased breath sounds to bases Abdomen: Soft, obese, nontender, bowel sounds present Extremities: Left foot with dressing, erythema warmth and swelling of the overlying dorsal foot  Skin: No rash on exposed skin  psychiatry: Judgement and insight appear normal. Mood & affect appropriate.   Data Reviewed:   CBC: Recent Labs  Lab 11/21/21 1828 11/23/21 0439  WBC 18.1* 17.2*  NEUTROABS 15.3*  --   HGB 10.7* 10.0*  HCT 34.8* 33.0*  MCV 86.1 86.2  PLT 475* 235*   Basic Metabolic Panel: Recent Labs  Lab 11/21/21 1828 11/23/21 0439  NA 133* 134*  K 4.5 4.1  CL 98 100  CO2 25 24  GLUCOSE 272* 163*  BUN  16 12  CREATININE 0.96 1.04*  CALCIUM 9.7 9.4   GFR: Estimated Creatinine Clearance: 70.8 mL/min (A) (by C-G formula based on SCr of 1.04 mg/dL (H)). Liver Function Tests: Recent Labs  Lab 11/21/21 1828  AST 21  ALT 19  ALKPHOS 54  BILITOT 0.7  PROT 8.2*  ALBUMIN 3.1*   Recent Labs  Lab 11/21/21 1828  LIPASE 25   No results for input(s): AMMONIA in the last 168 hours. Coagulation Profile: No results for input(s): INR, PROTIME in the last 168 hours. Cardiac Enzymes: Recent Labs  Lab 11/23/21 0439  CKTOTAL 16*   BNP (last 3 results) Recent Labs    01/12/21 1013  PROBNP 246   HbA1C: Recent Labs    11/23/21 0439  HGBA1C 10.7*   CBG: Recent Labs  Lab 11/22/21 1208 11/22/21 1802 11/22/21 2217 11/23/21 0615 11/23/21 1053  GLUCAP 317* 253* 194* 180* 181*   Lipid Profile: No results for input(s): CHOL, HDL, LDLCALC, TRIG, CHOLHDL, LDLDIRECT in the last 72 hours. Thyroid Function Tests: No results for input(s): TSH, T4TOTAL, FREET4, T3FREE, THYROIDAB in the last 72 hours. Anemia Panel: No results for input(s): VITAMINB12, FOLATE, FERRITIN, TIBC, IRON, RETICCTPCT in the last 72 hours. Urine analysis:    Component Value Date/Time   COLORURINE YELLOW 11/23/2021 0439   APPEARANCEUR CLEAR 11/23/2021 0439   LABSPEC 1.025 11/23/2021 0439   PHURINE 6.0 11/23/2021 0439   GLUCOSEU >=500 (A) 11/23/2021 0439   HGBUR TRACE (A) 11/23/2021 0439   BILIRUBINUR NEGATIVE 11/23/2021 0439   KETONESUR NEGATIVE 11/23/2021 0439   PROTEINUR >300 (A) 11/23/2021 0439   NITRITE POSITIVE (A) 11/23/2021 0439   LEUKOCYTESUR SMALL (A) 11/23/2021 0439   Sepsis Labs: @LABRCNTIP (procalcitonin:4,lacticidven:4)  ) Recent Results (from the past 240 hour(s))  Resp Panel by RT-PCR (Flu A&B, Covid) Nasopharyngeal Swab     Status: None   Collection Time: 11/21/21  6:18 PM   Specimen: Nasopharyngeal Swab; Nasopharyngeal(NP) swabs in vial transport medium  Result Value Ref Range Status    SARS Coronavirus 2 by RT PCR NEGATIVE NEGATIVE Final    Comment: (NOTE) SARS-CoV-2 target nucleic acids are NOT DETECTED.  The SARS-CoV-2 RNA is generally detectable in upper respiratory specimens during the acute phase of infection. The lowest concentration of SARS-CoV-2 viral copies this assay can detect is 138 copies/mL. A negative result does not preclude SARS-Cov-2 infection and should not be used as the sole basis for treatment or other patient management decisions. A negative result may occur with  improper specimen collection/handling, submission of specimen other than nasopharyngeal swab, presence of viral mutation(s) within the areas targeted by this assay, and inadequate number of viral copies(<138 copies/mL). A negative result must be combined with clinical observations, patient history, and epidemiological information. The expected result is Negative.  Fact Sheet for Patients:  EntrepreneurPulse.com.au  Fact Sheet for Healthcare Providers:  IncredibleEmployment.be  This test is no t yet approved or cleared by the [place] FDA and  has been authorized for detection and/or diagnosis of SARS-CoV-2 by FDA under an Emergency Use Authorization (EUA). This EUA will remain  in effect (meaning this test can be used) for the duration of the COVID-19 declaration under Section 564(b)(1) of the Act, 21 U.S.C.section  360bbb-3(b)(1), unless the authorization is terminated  or revoked sooner.       Influenza A by PCR NEGATIVE NEGATIVE Final   Influenza B by PCR NEGATIVE NEGATIVE Final    Comment: (NOTE) The Xpert Xpress SARS-CoV-2/FLU/RSV plus assay is intended as an aid in the diagnosis of influenza from Nasopharyngeal swab specimens and should not be used as a sole basis for treatment. Nasal washings and aspirates are unacceptable for Xpert Xpress SARS-CoV-2/FLU/RSV testing.  Fact Sheet for  Patients: EntrepreneurPulse.com.au  Fact Sheet for Healthcare Providers: IncredibleEmployment.be  This test is not yet approved or cleared by the [place] FDA and has been authorized for detection and/or diagnosis of SARS-CoV-2 by FDA under an Emergency Use Authorization (EUA). This EUA will remain in effect (meaning this test can be used) for the duration of the COVID-19 declaration under Section 564(b)(1) of the Act, 21 U.S.C. section 360bbb-3(b)(1), unless the authorization is terminated or revoked.  Performed at [facility], [place] [address], [place], [place] [address]   Blood culture (routine x 2)     Status: Abnormal (Preliminary result)   Collection Time: 11/21/21  6:28 PM   Specimen: Site Not Specified; Blood  Result Value Ref Range Status   Specimen Description SITE NOT SPECIFIED  Final   Special Requests   Final    BOTTLES DRAWN AEROBIC AND ANAEROBIC Blood Culture results may not be optimal due to an excessive volume of blood received in culture bottles   Culture  Setup Time   Final    GRAM POSITIVE COCCI IN CLUSTERS IN BOTH AEROBIC AND ANAEROBIC BOTTLES CRITICAL RESULT CALLED TO, READ BACK BY AND VERIFIED WITH: K PATEL PHARMD 1340 11/22/21 A BROWNING    Culture (A)  Final    STAPHYLOCOCCUS AUREUS SUSCEPTIBILITIES TO FOLLOW Performed at [facility], [place] [address], [place], [place] [address]    Report Status PENDING  Incomplete  Blood Culture ID Panel (Reflexed)     Status: Abnormal   Collection Time: 11/21/21  6:28 PM  Result Value Ref Range Status   Enterococcus faecalis NOT DETECTED NOT DETECTED Final   Enterococcus Faecium NOT DETECTED NOT DETECTED Final   Listeria monocytogenes NOT DETECTED NOT DETECTED Final   Staphylococcus species DETECTED (A) NOT DETECTED Final    Comment: CRITICAL RESULT CALLED TO, READ BACK BY AND VERIFIED WITH: K PATEL PHARMD 1340 11/22/21 A BROWNING    Staphylococcus aureus (BCID)  DETECTED (A) NOT DETECTED Final    Comment: Methicillin (oxacillin)-resistant Staphylococcus aureus (MRSA). MRSA is predictably resistant to beta-lactam antibiotics (except ceftaroline). Preferred therapy is vancomycin unless clinically contraindicated. Patient requires contact precautions if  hospitalized. CRITICAL RESULT CALLED TO, READ BACK BY AND VERIFIED WITH: K PATEL PHARMD 1340 11/22/21 A BROWNING    Staphylococcus epidermidis NOT DETECTED NOT DETECTED Final   Staphylococcus lugdunensis NOT DETECTED NOT DETECTED Final   Streptococcus species NOT DETECTED NOT DETECTED Final   Streptococcus agalactiae NOT DETECTED NOT DETECTED Final   Streptococcus pneumoniae NOT DETECTED NOT DETECTED Final   Streptococcus pyogenes NOT DETECTED NOT DETECTED Final   A.calcoaceticus-baumannii NOT DETECTED NOT DETECTED Final   Bacteroides fragilis NOT DETECTED NOT DETECTED Final   Enterobacterales NOT DETECTED NOT DETECTED Final   Enterobacter cloacae complex NOT DETECTED NOT DETECTED Final   Escherichia coli NOT DETECTED NOT DETECTED Final   Klebsiella aerogenes NOT DETECTED NOT DETECTED Final   Klebsiella oxytoca NOT DETECTED NOT DETECTED Final   Klebsiella pneumoniae NOT DETECTED NOT DETECTED Final  Proteus species NOT DETECTED NOT DETECTED Final   Salmonella species NOT DETECTED NOT DETECTED Final   Serratia marcescens NOT DETECTED NOT DETECTED Final   Haemophilus influenzae NOT DETECTED NOT DETECTED Final   Neisseria meningitidis NOT DETECTED NOT DETECTED Final   Pseudomonas aeruginosa NOT DETECTED NOT DETECTED Final   Stenotrophomonas maltophilia NOT DETECTED NOT DETECTED Final   Candida albicans NOT DETECTED NOT DETECTED Final   Candida auris NOT DETECTED NOT DETECTED Final   Candida glabrata NOT DETECTED NOT DETECTED Final   Candida krusei NOT DETECTED NOT DETECTED Final   Candida parapsilosis NOT DETECTED NOT DETECTED Final   Candida tropicalis NOT DETECTED NOT DETECTED Final    Cryptococcus neoformans/gattii NOT DETECTED NOT DETECTED Final   Meth resistant mecA/C and MREJ DETECTED (A) NOT DETECTED Final    Comment: CRITICAL RESULT CALLED TO, READ BACK BY AND VERIFIED WITH: K PATEL PHARMD 1340 11/22/21 A BROWNING Performed at [facility], [address] [place], [place] [address]   Surgical pcr screen     Status: None   Collection Time: 11/23/21 12:37 AM   Specimen: Nasal Mucosa; Nasal Swab  Result Value Ref Range Status   MRSA, PCR NEGATIVE NEGATIVE Final   Staphylococcus aureus NEGATIVE NEGATIVE Final    Comment: (NOTE) The Xpert SA Assay (FDA approved for NASAL specimens in patients 19 years of age and older), is one component of a comprehensive surveillance program. It is not intended to diagnose infection nor to guide or monitor treatment. Performed at [facility], [place] [address], [place], [place] [address]          Radiology Studies: MR FOOT LEFT WO CONTRAST  Result Date: 11/22/2021 CLINICAL DATA:  Foot pain and swelling. Diabetic with history of prior amputation. EXAM: MRI OF THE LEFT FOOT WITHOUT CONTRAST TECHNIQUE: Multiplanar, multisequence MR imaging of the left foot was performed. No intravenous contrast was administered. COMPARISON:  Multiple recent radiographs and foot CT from 10/09/2021 FINDINGS: Examination is quite limited due to patient motion and poor fat saturation. Surgical changes from prior amputation at the mid first metatarsal level. There is an overlying open wound which appears to extend right down to the bone. Abnormal T1 and T2 signal intensity in the first metatarsal highly suspicious for osteomyelitis. There is also abnormal T1 and T2 signal density in the second and third metatarsal heads consistent with osteomyelitis. Abnormal signal intensity in the second and third proximal phalanges also suspicious for osteomyelitis. Could not exclude septic arthritis at the second and third MTP joints. Diffuse cellulitis and  myofasciitis without obvious discrete drainable soft tissue abscess. IMPRESSION: 1. Limited examination due to patient motion and poor fat saturation. 2. Findings highly suspicious for osteomyelitis involving the first metatarsal, second and third metatarsal heads and the second and third proximal phalanges. 3. Diffuse cellulitis and myofasciitis without obvious discrete drainable soft tissue abscess. Electronically Signed   By: Marijo Sanes M.D.   On: 11/22/2021 16:04   DG Foot Complete Left  Result Date: 11/21/2021 CLINICAL DATA:  Left foot pain EXAM: LEFT FOOT - COMPLETE 3+ VIEW COMPARISON:  None. FINDINGS: Three view radiograph left foot demonstrates surgical changes of a first ray transmetatarsal amputation. There is a subacute intra-articular fracture of the base of second proximal phalanx with lateral dislocation of the a second MTP joint.z no other fracture or dislocation. Extensive soft tissue swelling involving the left forefoot. Superior and plantar calcaneal spurs are present. Vascular calcifications are seen. IMPRESSION: Intra-articular fracture of  the base of the second proximal phalanx with lateral dislocation of the second MTP joint. Electronically Signed   By: Fidela Salisbury M.D.   On: 11/21/2021 19:05   US BREAST LTD UNI RIGHT INC AXILLA  Result Date: 11/22/2021 CLINICAL DATA:  Right breast erythema EXAM: LIMITED ULTRASOUND OF THE RIGHT BREAST COMPARISON:  None. FINDINGS: Limited grayscale sonography was performed in the area of focal erythema. These images demonstrate no subcutaneous fluid collection to suggest a subcutaneous abscess. IMPRESSION: No subcutaneous fluid collection identified. Electronically Signed   By: Fidela Salisbury M.D.   On: 11/22/2021 19:38        Scheduled Meds:  [MAR Hold] amiodarone  200 mg Oral Daily   [MAR Hold] aspirin EC  81 mg Oral Daily   [MAR Hold] clopidogrel  75 mg Oral Daily   [MAR Hold] insulin aspart  0-15 Units Subcutaneous TID WC   [MAR  Hold] insulin aspart  0-5 Units Subcutaneous QHS   [MAR Hold] insulin aspart  5 Units Subcutaneous TID WC   [MAR Hold] insulin glargine-yfgn  30 Units Subcutaneous Daily   [MAR Hold] rosuvastatin  20 mg Oral QHS   [MAR Hold] sodium chloride flush  3 mL Intravenous Q12H   Continuous Infusions:  ceFAZolin     [MAR Hold] DAPTOmycin (CUBICIN)  IV Stopped (11/22/21 1654)   lactated ringers       LOS: 1 day    Time spent:  30min    Domenic Polite, MD [facility]   11/23/2021, 1:41 PM

## 2021-11-23 NOTE — Brief Op Note (Signed)
11/23/2021  1:41 PM  PATIENT:  Jasmine Richardson  60 y.o. female  PRE-OPERATIVE DIAGNOSIS:  Left foot wound infection/osteomyelitis  POST-OPERATIVE DIAGNOSIS:  Left foot wound infection/osteomyelitis  PROCEDURE:  Procedure(s) with comments: IRRIGATION AND DEBRIDEMENT FOOT (Left) - will do local block TRANSMETATARSAL AMPUTATION (Left)  SURGEON:  Surgeon(s) and Role:    Lorenda Peck, MD - Primary  PHYSICIAN ASSISTANT:   ASSISTANTS: none   ANESTHESIA:   MAC  EBL:  100 mL   BLOOD ADMINISTERED:none  DRAINS: (7) Jackson-Pratt drain(s) with closed bulb suction in the left foot wound     LOCAL MEDICATIONS USED:  MARCAINE    and LIDOCAINE   SPECIMEN:  Source of Specimen:  left foot   DISPOSITION OF SPECIMEN:   culture and pathology   COUNTS:  YES  TOURNIQUET:  * Missing tourniquet times found for documented tourniquets in log: [id number] *  DICTATION: .Note written in paper chart and Note written in EPIC  PLAN OF CARE: Admit to inpatient   PATIENT DISPOSITION:  PACU - hemodynamically stable.   Delay start of Pharmacological VTE agent (>24hrs) due to surgical blood loss or risk of bleeding: not applicable

## 2021-11-24 ENCOUNTER — Encounter (HOSPITAL_COMMUNITY): Payer: Self-pay | Admitting: Podiatry

## 2021-11-24 LAB — BASIC METABOLIC PANEL
Anion gap: 8 (ref 5–15)
BUN: 17 mg/dL (ref 8–23)
CO2: 25 mmol/L (ref 22–32)
Calcium: 9 mg/dL (ref 8.9–10.3)
Chloride: 99 mmol/L (ref 98–111)
Creatinine, Ser: 1.17 mg/dL — ABNORMAL HIGH (ref 0.44–1.00)
GFR, Estimated: 53 mL/min — ABNORMAL LOW (ref >60)
Glucose, Bld: 209 mg/dL — ABNORMAL HIGH (ref 70–99)
Potassium: 4.4 mmol/L (ref 3.5–5.1)
Sodium: 132 mmol/L — ABNORMAL LOW (ref 135–145)

## 2021-11-24 LAB — CBC
HCT: 27.4 % — ABNORMAL LOW (ref 36.0–46.0)
Hemoglobin: 8.5 g/dL — ABNORMAL LOW (ref 12.0–15.0)
MCH: 26.6 pg (ref 26.0–34.0)
MCHC: 31 g/dL (ref 30.0–36.0)
MCV: 85.9 fL (ref 80.0–100.0)
Platelets: 379 10*3/uL (ref 150–400)
RBC: 3.19 MIL/uL — ABNORMAL LOW (ref 3.87–5.11)
RDW: 14.6 % (ref 11.5–15.5)
WBC: 12.9 10*3/uL — ABNORMAL HIGH (ref 4.0–10.5)
nRBC: 0 % (ref 0.0–0.2)

## 2021-11-24 LAB — GLUCOSE, CAPILLARY
Glucose-Capillary: 190 mg/dL — ABNORMAL HIGH (ref 70–99)
Glucose-Capillary: 238 mg/dL — ABNORMAL HIGH (ref 70–99)
Glucose-Capillary: 296 mg/dL — ABNORMAL HIGH (ref 70–99)
Glucose-Capillary: 306 mg/dL — ABNORMAL HIGH (ref 70–99)

## 2021-11-24 LAB — CULTURE, BLOOD (ROUTINE X 2)

## 2021-11-24 MED ORDER — INSULIN GLARGINE-YFGN 100 UNIT/ML ~~LOC~~ SOLN
35.0000 [IU] | Freq: Every day | SUBCUTANEOUS | Status: DC
  Administered 2021-11-25: 35 [IU] via SUBCUTANEOUS
  Filled 2021-11-24 (×2): qty 0.35

## 2021-11-24 NOTE — Progress Notes (Addendum)
Subjective:  [name], female    DOB: 04-16-1961,  MRN: [id number]  Patient seen at bedside this morning s/p left transmetatarsal amputation. Relates she is doing well without any pain. Denies n/v/f/c.   Past Medical History:  Diagnosis Date   Acute chest pain 05/08/2016   Angina pectoris ([place]) 09/11/2017   Burping 05/08/2016   Cancer ([place])    cervical - hysterectomy   Chronic diastolic heart failure ([place]) 11/06/2016   Coronary artery disease involving native coronary artery of native heart with angina pectoris ([place]) 09/12/2015   Overview:  PCI and stent of RCA 2009, last cath 2012 with medical therapy  CABG May 2017   Emphysema lung ([place]) 09/11/2017   patient not aware of this dx   Essential hypertension 09/12/2015   GERD (gastroesophageal reflux disease) 05/08/2016   Hyperlipidemia 09/12/2015   Mediastinitis 06/28/2016   Morbid obesity ([place]) 05/08/2016   Myocardial infarction [facility])    Peripheral vascular disease ([place])    S/P CABG (coronary artery bypass graft) 06/03/2016   Overview:  The patient underwent sternal reconstruction on 06/21/16 with pec flaps for mediastinitis from a prior CABG in May 2017. On admission, she was critically ill from sepsis and had altered mental status. She was last seen in clinic on 08/09/16 at which time she was doing well.   Severe sepsis ([place]) 06/03/2016   Sinus tachycardia 05/08/2016   Tobacco use disorder 04/20/2016   Overview:  Quit in May 2017.   Uncontrolled type 2 diabetes mellitus 05/08/2016   Wound, surgical, infected 06/06/2016   Overview:  sternal     Past Surgical History:  Procedure Laterality Date   ABDOMINAL AORTOGRAM W/LOWER EXTREMITY N/A 10/11/2021   Procedure: ABDOMINAL AORTOGRAM W/LOWER EXTREMITY;  Surgeon: Marty Heck, MD;  Location: [place] [facility];  Service: Cardiovascular;  Laterality: N/A;   AMPUTATION TOE Right 02/20/2020   Procedure: AMPUTATION RIGHT GREAT  TOE;  Surgeon: Felipa Furnace, DPM;   Location: [place] [facility];  Service: Podiatry;  Laterality: Right;   BLADDER SURGERY     CARDIAC CATHETERIZATION     CORONARY ARTERY BYPASS GRAFT     CORONARY STENT INTERVENTION N/A 05/25/2020   Procedure: CORONARY STENT INTERVENTION;  Surgeon: Wellington Hampshire, MD;  Location: [place] [facility] [facility];  Service: Cardiovascular;  Laterality: N/A;   CORONARY STENT INTERVENTION N/A 06/22/2020   Procedure: CORONARY STENT INTERVENTION;  Surgeon: Leonie Man, MD;  Location: [place];  Service: Cardiovascular;  Laterality: N/A;   ICD IMPLANT N/A 12/19/2020   Procedure: ICD IMPLANT;  Surgeon: Evans Lance, MD;  Location: [place] [facility];  Service: Cardiovascular;  Laterality: N/A;   INCISION AND DRAINAGE Right 02/19/2020   Procedure: INCISION AND DRAINAGE;  Surgeon: Trula Slade, DPM;  Location: [facility];  Service: Podiatry;  Laterality: Right;  Block done by surgeon   INTRAVASCULAR ULTRASOUND/IVUS N/A 06/22/2020   Procedure: Intravascular Ultrasound/IVUS;  Surgeon: Leonie Man, MD;  Location: [facility];  Service: Cardiovascular;  Laterality: N/A;   IRRIGATION AND DEBRIDEMENT FOOT Left 10/10/2021   Procedure: IRRIGATION AND DEBRIDEMENT FOOT;  Surgeon: Landis Martins, DPM;  Location: [place];  Service: Podiatry;  Laterality: Left;   LEFT HEART CATH AND CORS/GRAFTS ANGIOGRAPHY N/A 05/23/2020   Procedure: LEFT HEART CATH AND CORS/GRAFTS ANGIOGRAPHY;  Surgeon: Nelva Bush, MD;  Location: [facility];  Service: Cardiovascular;  Laterality: N/A;   LEFT HEART CATH AND CORS/GRAFTS ANGIOGRAPHY N/A 12/19/2020   Procedure:  LEFT HEART CATH AND CORS/GRAFTS ANGIOGRAPHY;  Surgeon: Burnell Blanks, MD;  Location: [place] [facility];  Service: Cardiovascular;  Laterality: N/A;   LEFT HEART CATH AND CORS/GRAFTS ANGIOGRAPHY N/A 07/21/2021   Procedure: LEFT HEART CATH AND CORS/GRAFTS ANGIOGRAPHY;  Surgeon: Nelva Bush, MD;  Location: [facility];  Service: Cardiovascular;  Laterality:  N/A;   METATARSAL HEAD EXCISION Right 05/02/2020   Procedure: FIRST METATARSAL HEAD RESECTION; RIGHT FOOT WOUND CLOSURE;  Surgeon: Landis Martins, DPM;  Location: [facility];  Service: Podiatry;  Laterality: Right;  MAC W/LOCAL   PERIPHERAL VASCULAR BALLOON ANGIOPLASTY Left 10/11/2021   Procedure: PERIPHERAL VASCULAR BALLOON ANGIOPLASTY;  Surgeon: Marty Heck, MD;  Location: [place] [facility];  Service: Cardiovascular;  Laterality: Left;  Anterior Tibial Artery   TUBAL LIGATION      CBC Latest Ref Rng & Units 11/24/2021 11/23/2021 11/21/2021  WBC 4.0 - 10.5 K/uL 12.9(H) 17.2(H) 18.1(H)  Hemoglobin 12.0 - 15.0 g/dL 8.5(L) 10.0(L) 10.7(L)  Hematocrit 36.0 - 46.0 % 27.4(L) 33.0(L) 34.8(L)  Platelets 150 - 400 K/uL 379 446(H) 475(H)    BMP Latest Ref Rng & Units 11/24/2021 11/23/2021 11/21/2021  Glucose 70 - 99 mg/dL 209(H) 163(H) 272(H)  BUN 8 - 23 mg/dL _0 Creatinine 0.44 - 1.00 mg/dL 1.17(H) 1.04(H) 0.96  BUN/Creat Ratio 12 - 28 - - -  Sodium 135 - 145 mmol/L 132(L) 134(L) 133(L)  Potassium 3.5 - 5.1 mmol/L 4.4 4.1 4.5  Chloride 98 - 111 mmol/L 99 100 98  CO2 22 - 32 mmol/L _1 Calcium 8.9 - 10.3 mg/dL 9.0 9.4 9.7     Objective:   Vitals:   11/24/21 0012 11/24/21 0423  BP: 130/69 135/70  Pulse: 93 93  Resp: 16 18  Temp: (!) 100.5 F (38.1 C) 99.1 F (37.3 C)  SpO2: 94% 99%    General:AA&O x 3. Normal mood and affect   Vascular: DP and PT pulses 2/4 bilateral. Brisk capillary refill to all digits. Pedal hair present   Neruological. Epicritic sensation grossly intact.   Derm: Dressing clean dry and intact. Drain with about 5 cc of output.   MSK: MMT 5/5 in dorsiflexion, plantar flexion, inversion and eversion. Normal joint ROM without pain or crepitus.     Assessment & Plan:  Patient was evaluated and treated and all questions answered.  DX: s/p left transmetatarsal amputation Patient to keep dressing intact. Drain was pulled today  with minimal output.   Antibiotics: Will follow cultures. ID recommendations appreciated.  DME: post-op shoe , may be heel weightbearing in surgical shoe.  Discussed with patient diagnosis and treatment options.  Imaging reviewed.  Discussed with patient perioperative course. She is ok for discharge from a podiatry standpoint pending cultures. She will follow-up with Dr. Cannon Kettle in one week upon discharge.  Patient in agreement with plan and all questions answered.   Lorenda Peck, MD  Accessible via secure chat for questions or concerns.

## 2021-11-24 NOTE — Progress Notes (Addendum)
Inpatient Diabetes Program Recommendations  AACE/ADA: New Consensus Statement on Inpatient Glycemic Control (2015)  Target Ranges:  Prepandial:   less than 140 mg/dL      Peak postprandial:   less than 180 mg/dL (1-2 hours)      Critically ill patients:  140 - 180 mg/dL   Lab Results  Component Value Date   GLUCAP 190 (H) 11/24/2021   HGBA1C 10.7 (H) 11/23/2021    Review of Glycemic Control  Latest Reference Range & Units 11/23/21 16:34 11/23/21 21:47 11/24/21 06:14  Glucose-Capillary 70 - 99 mg/dL 190 (H) 218 (H) 190 (H)  (H): Data is abnormally high Diabetes history: Type 2 DM Outpatient Diabetes medication: Humalog 10-50 units TID, Tresiba 20 units QD, Metformin 500 mg BID Current orders for Inpatient glycemic control: Semglee 30 units QD, Novolog 5 units TID, Novolog 0-5 units QHs, Novolog 0-15 units TID   Inpatient Diabetes Program Recommendations:    Consider increasing Semglee 34 units QD.   Addendum: Spoke with patient again to reinforce concepts discussed on 12/8. Reviewed Freestyle Libre with patient to include risks vs benefits, cost, and application. Patient is scheduled to get an updated phone; today app unavailable. Son is planning to assist. Will send Freestyle libre application video to view and provided demonstration on application. Further instructions attached to DC summary. Patient encouraged to follow up with PCP regarding trends as would anticipate insulin adjustment.  Thanks, Bronson Curb, MSN, RNC-OB Diabetes Coordinator [phone] (8a-5p)

## 2021-11-24 NOTE — Evaluation (Signed)
Physical Therapy Evaluation Patient Details Name: Jasmine Richardson MRN: [id number] DOB: Jul 12, 1961 Today's Date: 11/24/2021  History of Present Illness  Pt is a 60 y.o. female who presented 11/21/21 with worsening L foot pain. Of note, pt with L great toe amputation in October. MRI of L foot this admission revealed findings highly suspicious for osteomyelitis involving the first  metatarsal, second and third metatarsal heads and the second and  third proximal phalanges along with diffuse cellulitis and myofasciitis. S/p I&D of L foot and L transmetatarsal amputation 12/8. PMH: CAD, COPD, chronic systolic/diastolic CHF, type 2 diabetes mellitus, anxiety, depression, history of V. tach, ICD   Clinical Impression  Pt presents with condition above and deficits mentioned below, see PT Problem List. PTA, she was using a SPC intermittently for mod I mobility, needing assistance for stairs in the community, and relying on her husband to wash her as she stood holding onto grab bars in the shower. She lives with her family in a house with x1 short stair to enter/exit. Currently, pt displays deficits in L leg strength and ROM likely due to pain s/p transmetatarsal amputation yesterday. In addition, the pain and her precaution for partial weight bearing through heel only on the L impact her balance and activity tolerance. Pt is able to perform all functional mobility, including short household mobility, safely at a Supervision level using a RW at this time. Pt would benefit from follow-up with Outpatient PT and further acute PT services to address her deficits and maximize her independence and safety with all functional mobility. Will continue to follow acutely.     Recommendations for follow up therapy are one component of a multi-disciplinary discharge planning process, led by the attending physician.  Recommendations may be updated based on patient status, additional functional criteria and insurance  authorization.  Follow Up Recommendations Outpatient PT    Assistance Recommended at Discharge Intermittent Supervision/Assistance  Functional Status Assessment Patient has had a recent decline in their functional status and demonstrates the ability to make significant improvements in function in a reasonable and predictable amount of time.  Equipment Recommendations  None recommended by PT    Recommendations for Other Services       Precautions / Restrictions Precautions Precautions: Fall Precaution Comments: L transmetatarsal amputation 12/8 Restrictions Weight Bearing Restrictions: Yes LLE Weight Bearing: Partial weight bearing (through heel) LLE Partial Weight Bearing Percentage or Pounds:  (heel weight bearing) Other Position/Activity Restrictions: Requested MD and RN to place order for [place] shoe for pt      Mobility  Bed Mobility Overal bed mobility: Modified Independent             General bed mobility comments: Pt able to perform all bed mobility aspects without assistance, using bed rails and controls.    Transfers Overall transfer level: Needs assistance Equipment used: Rolling walker (2 wheels) Transfers: Sit to/from Stand Sit to Stand: Supervision           General transfer comment: Pt initially pulling up on RW to stand, corrected pt to push up with at least 1 UE off bed for safety, supervision for safety. Cues provided to place L foot anteriorly with transfers to reduce weight on forefoot to maintain weight bearing orders compliance.    Ambulation/Gait Ambulation/Gait assistance: Supervision Gait Distance (Feet): 24 Feet Assistive device: Rolling walker (2 wheels) Gait Pattern/deviations: Step-to pattern;Decreased stance time - left;Decreased weight shift to left;Decreased stride length;Antalgic;Trunk flexed Gait velocity: reduced Gait velocity interpretation: <1.8 ft/sec, indicate of risk  for recurrent falls   General Gait Details: Pt with  slow, but steady antalgic step-to gait pattern, complying with heel weight bearing precautions appropriately. Pt limited in distance by pain. No LOB, supervision for safety.  Stairs            Wheelchair Mobility    Modified Rankin (Stroke Patients Only)       Balance Overall balance assessment: Needs assistance Sitting-balance support: No upper extremity supported;Feet supported Sitting balance-Leahy Scale: Good     Standing balance support: Reliant on assistive device for balance Standing balance-Leahy Scale: Poor Standing balance comment: Reliant on RW                             Pertinent Vitals/Pain Pain Assessment: Faces Faces Pain Scale: Hurts even more Pain Location: L foot Pain Descriptors / Indicators: Discomfort;Grimacing;Operative site guarding Pain Intervention(s): Limited activity within patient's tolerance;Monitored during session;Repositioned    Home Living Family/patient expects to be discharged to:: Private residence Living Arrangements: Spouse/significant other;Children Available Help at Discharge: Family;Available 24 hours/day Type of Home: House Home Access: Stairs to enter Entrance Stairs-Rails: Right Entrance Stairs-Number of Steps: 1 (short)   Home Layout: One level Home Equipment: Conservation officer, nature (2 wheels);Rollator (4 wheels);Cane - single point;BSC/3in1;Shower seat;Grab bars - tub/shower;Wheelchair - manual      Prior Function Prior Level of Function : Needs assist       Physical Assist : ADLs (physical)   ADLs (physical): Bathing;IADLs Mobility Comments: Pt ambulates with a SPC, intermittently no device, mod I within her home. Pt reports mod I for 1 short stair to enter/exit home, but husband helps her with other stairs in community. ADLs Comments: Husband washes her with her standing in shower holding onto grab bars. Family does most of the cooking and cleaning. Pt does not drive.     Hand Dominance         Extremity/Trunk Assessment   Upper Extremity Assessment Upper Extremity Assessment: Overall WFL for tasks assessed    Lower Extremity Assessment Lower Extremity Assessment: LLE deficits/detail LLE Deficits / Details: s/p L transmetatarsal amputation 12/8 with foot in ACE wraps; weakness likely due to pain with mobility    Cervical / Trunk Assessment Cervical / Trunk Assessment: Normal  Communication   Communication: No difficulties  Cognition Arousal/Alertness: Awake/alert Behavior During Therapy: WFL for tasks assessed/performed Overall Cognitive Status: Within Functional Limits for tasks assessed                                          General Comments General comments (skin integrity, edema, etc.): Educated pt on elevating L leg; educated pt to get her brother's shower chair for improved safety with showers and that she can use her rollator if she can do so without pushing through it a lot and maintaining safety, otherwise to use RW; quickly verbally reviewed stair negotiation sequencing of feet    Exercises     Assessment/Plan    PT Assessment Patient needs continued PT services  PT Problem List Decreased strength;Decreased range of motion;Decreased activity tolerance;Decreased balance;Decreased mobility;Decreased skin integrity;Pain       PT Treatment Interventions DME instruction;Gait training;Stair training;Functional mobility training;Therapeutic activities;Balance training;Therapeutic exercise;Neuromuscular re-education;Patient/family education    PT Goals (Current goals can be found in the Care Plan section)  Acute Rehab PT Goals Patient Stated Goal: to get  better and go to outpatient PT PT Goal Formulation: With patient Time For Goal Achievement: 12/08/21 Potential to Achieve Goals: Good    Frequency Min 3X/week   Barriers to discharge        Co-evaluation               AM-PAC PT "6 Clicks" Mobility  Outcome Measure Help needed  turning from your back to your side while in a flat bed without using bedrails?: None Help needed moving from lying on your back to sitting on the side of a flat bed without using bedrails?: None Help needed moving to and from a bed to a chair (including a wheelchair)?: A Little Help needed standing up from a chair using your arms (e.g., wheelchair or bedside chair)?: A Little Help needed to walk in hospital room?: A Little Help needed climbing 3-5 steps with a railing? : A Little 6 Click Score: 20    End of Session   Activity Tolerance: Patient tolerated treatment well;Patient limited by pain Patient left: in bed;with call bell/phone within reach;with bed alarm set Nurse Communication: Mobility status PT Visit Diagnosis: Unsteadiness on feet (R26.81);Other abnormalities of gait and mobility (R26.89);Difficulty in walking, not elsewhere classified (R26.2);Pain Pain - Right/Left: Left Pain - part of body: Ankle and joints of foot    Time: 1779-3903 PT Time Calculation (min) (ACUTE ONLY): 16 min   Charges:   PT Evaluation $PT Eval Moderate Complexity: 1 Mod          Moishe Spice, PT, DPT Acute Rehabilitation Services  Pager: [phone] Office: [place] 11/24/2021, 4:58 PM

## 2021-11-24 NOTE — Anesthesia Postprocedure Evaluation (Signed)
Anesthesia Post Note  Patient: Jasmine Richardson  Procedure(s) Performed: IRRIGATION AND DEBRIDEMENT FOOT (Left: Foot) TRANSMETATARSAL AMPUTATION (Left: Foot)     Patient location during evaluation: PACU Anesthesia Type: MAC and Regional Level of consciousness: awake and alert Pain management: pain level controlled Vital Signs Assessment: post-procedure vital signs reviewed and stable Respiratory status: spontaneous breathing, nonlabored ventilation, respiratory function stable and patient connected to nasal cannula oxygen Cardiovascular status: stable and blood pressure returned to baseline Postop Assessment: no apparent nausea or vomiting Anesthetic complications: no   No notable events documented.  Last Vitals:  Vitals:   11/24/21 0423 11/24/21 1111  BP: 135/70 134/63  Pulse: 93 78  Resp: 18 18  Temp: 37.3 C 37.2 C  SpO2: 99% 96%    Last Pain:  Vitals:   11/24/21 1111  TempSrc: Oral  PainSc:                  Elnora Quizon

## 2021-11-24 NOTE — Progress Notes (Signed)
PROGRESS NOTE    Jasmine Richardson  OFB:[id number] DOB: 05-20-1961 DOA: 11/21/2021 PCP: Nicholos Johns, MD  Brief Narrative: 60/F with history of CAD, COPD, chronic systolic/diastolic CHF, type 2 diabetes mellitus, anxiety, depression, history of V. tach, ICD presented to the ED with worsening left foot pain. -Recent left great toe amputation in October, followed by admission for I&D. -In the emergency room she was febrile to 100.9, labs noted WBC of 18, foot x-ray noted fracture base of left second phalanx with displacement -Started on linezolid and Zosyn -ID and podiatry following, 12/8-transmetatarsal amputation  Assessment & Plan:   Sepsis, POA Left diabetic foot infection, recent toe amputation and debridement Osteomyelitis of toes, metatarsals MRSA bacteremia -Continue IV daptomycin, PIP Tazo -Infectious disease consulting, repeat blood cultures -2D echo noted preserved EF, no vegetations noted, small, highly echogenic, well circumscribed mass measuring  0.6x1.0cm on the LCC most consistent with asymmetric calcification. This  was also present on prior study and unlikely to represent a vegetation -Podiatry following, MRI foot noted findings suspicious for osteomyelitis involving first metatarsal, second and third metatarsal heads and the second and third proximal phalanges, myositis, no abscess -Underwent transmetatarsal amputation 12/8 Dr. Blenda Mounts -Follow-up OR cultures  PAD -Recently underwent aortogram, left anterior tibial artery angioplasty by Dr. Carlis Abbott on 10/26 -Continue aspirin Plavix statin  Postop blood loss anemia -Check anemia panel, continue to trend  Type 2 diabetes mellitus -Continue Semglee, increased dose, NovoLog with meals, SSI -Hemoglobin A1c is 10.7  Asymptomatic pyuria -Urinalysis abnormal with positive nitrite, small leukocytes, > 50 WBCs, patient denies any symptoms whatsoever, she is awake and alert, on antibiotics for above -Monitor clinically  Chronic  systolic and diastolic CHF CAD Hypertension -Clinically euvolemic at this time, continue home regimen of aspirin, Plavix,  metoprolol, Lasix -holding valsartan, imdur -Restart Lasix tomorrow -Echo with EF of 50-55%  History of V. Tach s/p AICD -Continue amiodarone  History of gangrenous skin/subcutaneous tissue of right breast -s/p excisional debridement by Dr. Leonidas Romberg 11/7 -Now with large scab, no surrounding  tenderness erythema or warmth  DVT prophylaxis: On DAPT with recent surgery, holding anticoagulation Code Status: Full code Family Communication: Discussed with patient in detail, no family at bedside Disposition Plan:  Status is:  inpatient because: Severity of illness  Consultants:  Podiatry, infectious disease  Procedures: Transmetatarsal amputation Dr. Blenda Mounts 12/8   Antimicrobials:    Subjective: -Feels okay overall, denies any dyspnea  Objective: Vitals:   11/23/21 1934 11/24/21 0012 11/24/21 0300 11/24/21 0423  BP: 126/70 130/69  135/70  Pulse: 86 93  93  Resp: 20 16  18   Temp: 100.2 F (37.9 C) (!) 100.5 F (38.1 C)  99.1 F (37.3 C)  TempSrc: Oral Oral  Oral  SpO2: 96% 94%  99%  Weight:   109.7 kg   Height:        Intake/Output Summary (Last 24 hours) at 11/24/2021 1022 Last data filed at 11/24/2021 0205 Gross per 24 hour  Intake 755.75 ml  Output 157.5 ml  Net 598.25 ml   Filed Weights   11/23/21 0432 11/23/21 1147 11/24/21 0300  Weight: 107.6 kg 104.8 kg 109.7 kg    Examination:  General exam: Obese pleasant female sitting up in bed, AAOx3, no distress HEENT: No JVD CVS: S1-S2, regular rate rhythm Chest: Large healing scab over inner quadrant left breast Lungs: Decreased breath sounds at bases Abdomen: Soft, nontender, bowel sounds present Extremities: Left foot with transmetatarsal amputation, dressing  Skin: No rash on exposed skin  psychiatry:  Mood & affect appropriate.   Data Reviewed:   CBC: Recent Labs  Lab  11/21/21 1828 11/23/21 0439 11/24/21 0230  WBC 18.1* 17.2* 12.9*  NEUTROABS 15.3*  --   --   HGB 10.7* 10.0* 8.5*  HCT 34.8* 33.0* 27.4*  MCV 86.1 86.2 85.9  PLT 475* 446* 638   Basic Metabolic Panel: Recent Labs  Lab 11/21/21 1828 11/23/21 0439 11/24/21 0230  NA 133* 134* 132*  K 4.5 4.1 4.4  CL 98 100 99  CO2 25 24 25   GLUCOSE 272* 163* 209*  BUN 16 12 17   CREATININE 0.96 1.04* 1.17*  CALCIUM 9.7 9.4 9.0   GFR: Estimated Creatinine Clearance: 64.4 mL/min (A) (by C-G formula based on SCr of 1.17 mg/dL (H)). Liver Function Tests: Recent Labs  Lab 11/21/21 1828  AST 21  ALT 19  ALKPHOS 54  BILITOT 0.7  PROT 8.2*  ALBUMIN 3.1*   Recent Labs  Lab 11/21/21 1828  LIPASE 25   No results for input(s): AMMONIA in the last 168 hours. Coagulation Profile: No results for input(s): INR, PROTIME in the last 168 hours. Cardiac Enzymes: Recent Labs  Lab 11/23/21 0439  CKTOTAL 16*   BNP (last 3 results) Recent Labs    01/12/21 1013  PROBNP 246   HbA1C: Recent Labs    11/23/21 0439  HGBA1C 10.7*   CBG: Recent Labs  Lab 11/23/21 1053 11/23/21 1349 11/23/21 1634 11/23/21 2147 11/24/21 0614  GLUCAP 181* 216* 190* 218* 190*   Lipid Profile: No results for input(s): CHOL, HDL, LDLCALC, TRIG, CHOLHDL, LDLDIRECT in the last 72 hours. Thyroid Function Tests: No results for input(s): TSH, T4TOTAL, FREET4, T3FREE, THYROIDAB in the last 72 hours. Anemia Panel: No results for input(s): VITAMINB12, FOLATE, FERRITIN, TIBC, IRON, RETICCTPCT in the last 72 hours. Urine analysis:    Component Value Date/Time   COLORURINE YELLOW 11/23/2021 0439   APPEARANCEUR CLEAR 11/23/2021 0439   LABSPEC 1.025 11/23/2021 0439   PHURINE 6.0 11/23/2021 0439   GLUCOSEU >=500 (A) 11/23/2021 0439   HGBUR TRACE (A) 11/23/2021 0439   BILIRUBINUR NEGATIVE 11/23/2021 0439   KETONESUR NEGATIVE 11/23/2021 0439   PROTEINUR >300 (A) 11/23/2021 0439   NITRITE POSITIVE (A) 11/23/2021  0439   LEUKOCYTESUR SMALL (A) 11/23/2021 0439   Sepsis Labs: @LABRCNTIP (procalcitonin:4,lacticidven:4)  ) Recent Results (from the past 240 hour(s))  Resp Panel by RT-PCR (Flu A&B, Covid) Nasopharyngeal Swab     Status: None   Collection Time: 11/21/21  6:18 PM   Specimen: Nasopharyngeal Swab; Nasopharyngeal(NP) swabs in vial transport medium  Result Value Ref Range Status   SARS Coronavirus 2 by RT PCR NEGATIVE NEGATIVE Final    Comment: (NOTE) SARS-CoV-2 target nucleic acids are NOT DETECTED.  The SARS-CoV-2 RNA is generally detectable in upper respiratory specimens during the acute phase of infection. The lowest concentration of SARS-CoV-2 viral copies this assay can detect is 138 copies/mL. A negative result does not preclude SARS-Cov-2 infection and should not be used as the sole basis for treatment or other patient management decisions. A negative result may occur with  improper specimen collection/handling, submission of specimen other than nasopharyngeal swab, presence of viral mutation(s) within the areas targeted by this assay, and inadequate number of viral copies(<138 copies/mL). A negative result must be combined with clinical observations, patient history, and epidemiological information. The expected result is Negative.  Fact Sheet for Patients:  EntrepreneurPulse.com.au  Fact Sheet for Healthcare Providers:  IncredibleEmployment.be  This test is no t  yet approved or cleared by the [place] and  has been authorized for detection and/or diagnosis of SARS-CoV-2 by FDA under an Emergency Use Authorization (EUA). This EUA will remain  in effect (meaning this test can be used) for the duration of the COVID-19 declaration under Section 564(b)(1) of the Act, 21 U.S.C.section 360bbb-3(b)(1), unless the authorization is terminated  or revoked sooner.       Influenza A by PCR NEGATIVE NEGATIVE Final   Influenza B by PCR  NEGATIVE NEGATIVE Final    Comment: (NOTE) The Xpert Xpress SARS-CoV-2/FLU/RSV plus assay is intended as an aid in the diagnosis of influenza from Nasopharyngeal swab specimens and should not be used as a sole basis for treatment. Nasal washings and aspirates are unacceptable for Xpert Xpress SARS-CoV-2/FLU/RSV testing.  Fact Sheet for Patients: EntrepreneurPulse.com.au  Fact Sheet for Healthcare Providers: IncredibleEmployment.be  This test is not yet approved or cleared by the [place] FDA and has been authorized for detection and/or diagnosis of SARS-CoV-2 by FDA under an Emergency Use Authorization (EUA). This EUA will remain in effect (meaning this test can be used) for the duration of the COVID-19 declaration under Section 564(b)(1) of the Act, 21 U.S.C. section 360bbb-3(b)(1), unless the authorization is terminated or revoked.  Performed at [facility], [place] [address], [place], [place] [address]   Blood culture (routine x 2)     Status: Abnormal   Collection Time: 11/21/21  6:28 PM   Specimen: Site Not Specified; Blood  Result Value Ref Range Status   Specimen Description SITE NOT SPECIFIED  Final   Special Requests   Final    BOTTLES DRAWN AEROBIC AND ANAEROBIC Blood Culture results may not be optimal due to an excessive volume of blood received in culture bottles   Culture  Setup Time   Final    GRAM POSITIVE COCCI IN CLUSTERS IN BOTH AEROBIC AND ANAEROBIC BOTTLES CRITICAL RESULT CALLED TO, READ BACK BY AND VERIFIED WITH: Keith Rake PHARMD 1340 11/22/21 A BROWNING Performed at [facility], [place] [address] [place], [place] [address]    Culture METHICILLIN RESISTANT STAPHYLOCOCCUS AUREUS (A)  Final   Report Status 11/24/2021 FINAL  Final   Organism ID, Bacteria METHICILLIN RESISTANT STAPHYLOCOCCUS AUREUS  Final      Susceptibility   Methicillin resistant staphylococcus aureus - MIC*    CIPROFLOXACIN >=8 RESISTANT  Resistant     ERYTHROMYCIN >=8 RESISTANT Resistant     GENTAMICIN <=0.5 SENSITIVE Sensitive     OXACILLIN >=4 RESISTANT Resistant     TETRACYCLINE 2 SENSITIVE Sensitive     VANCOMYCIN <=0.5 SENSITIVE Sensitive     TRIMETH/SULFA <=10 SENSITIVE Sensitive     CLINDAMYCIN >=8 RESISTANT Resistant     RIFAMPIN <=0.5 SENSITIVE Sensitive     Inducible Clindamycin NEGATIVE Sensitive     * METHICILLIN RESISTANT STAPHYLOCOCCUS AUREUS  Blood Culture ID Panel (Reflexed)     Status: Abnormal   Collection Time: 11/21/21  6:28 PM  Result Value Ref Range Status   Enterococcus faecalis NOT DETECTED NOT DETECTED Final   Enterococcus Faecium NOT DETECTED NOT DETECTED Final   Listeria monocytogenes NOT DETECTED NOT DETECTED Final   Staphylococcus species DETECTED (A) NOT DETECTED Final    Comment: CRITICAL RESULT CALLED TO, READ BACK BY AND VERIFIED WITH: K PATEL PHARMD 1340 11/22/21 A BROWNING    Staphylococcus aureus (BCID) DETECTED (A) NOT DETECTED Final    Comment: Methicillin (oxacillin)-resistant Staphylococcus aureus (MRSA). MRSA is predictably resistant  to beta-lactam antibiotics (except ceftaroline). Preferred therapy is vancomycin unless clinically contraindicated. Patient requires contact precautions if  hospitalized. CRITICAL RESULT CALLED TO, READ BACK BY AND VERIFIED WITH: K PATEL PHARMD 1340 11/22/21 A BROWNING    Staphylococcus epidermidis NOT DETECTED NOT DETECTED Final   Staphylococcus lugdunensis NOT DETECTED NOT DETECTED Final   Streptococcus species NOT DETECTED NOT DETECTED Final   Streptococcus agalactiae NOT DETECTED NOT DETECTED Final   Streptococcus pneumoniae NOT DETECTED NOT DETECTED Final   Streptococcus pyogenes NOT DETECTED NOT DETECTED Final   A.calcoaceticus-baumannii NOT DETECTED NOT DETECTED Final   Bacteroides fragilis NOT DETECTED NOT DETECTED Final   Enterobacterales NOT DETECTED NOT DETECTED Final   Enterobacter cloacae complex NOT DETECTED NOT DETECTED Final    Escherichia coli NOT DETECTED NOT DETECTED Final   Klebsiella aerogenes NOT DETECTED NOT DETECTED Final   Klebsiella oxytoca NOT DETECTED NOT DETECTED Final   Klebsiella pneumoniae NOT DETECTED NOT DETECTED Final   Proteus species NOT DETECTED NOT DETECTED Final   Salmonella species NOT DETECTED NOT DETECTED Final   Serratia marcescens NOT DETECTED NOT DETECTED Final   Haemophilus influenzae NOT DETECTED NOT DETECTED Final   Neisseria meningitidis NOT DETECTED NOT DETECTED Final   Pseudomonas aeruginosa NOT DETECTED NOT DETECTED Final   Stenotrophomonas maltophilia NOT DETECTED NOT DETECTED Final   Candida albicans NOT DETECTED NOT DETECTED Final   Candida auris NOT DETECTED NOT DETECTED Final   Candida glabrata NOT DETECTED NOT DETECTED Final   Candida krusei NOT DETECTED NOT DETECTED Final   Candida parapsilosis NOT DETECTED NOT DETECTED Final   Candida tropicalis NOT DETECTED NOT DETECTED Final   Cryptococcus neoformans/gattii NOT DETECTED NOT DETECTED Final   Meth resistant mecA/C and MREJ DETECTED (A) NOT DETECTED Final    Comment: CRITICAL RESULT CALLED TO, READ BACK BY AND VERIFIED WITH: K PATEL PHARMD 1340 11/22/21 A BROWNING Performed at [facility], [address] [place], [place] [address]   Surgical pcr screen     Status: None   Collection Time: 11/23/21 12:37 AM   Specimen: Nasal Mucosa; Nasal Swab  Result Value Ref Range Status   MRSA, PCR NEGATIVE NEGATIVE Final   Staphylococcus aureus NEGATIVE NEGATIVE Final    Comment: (NOTE) The Xpert SA Assay (FDA approved for NASAL specimens in patients 49 years of age and older), is one component of a comprehensive surveillance program. It is not intended to diagnose infection nor to guide or monitor treatment. Performed at [facility], [place] [address], [place], [place] [address]          Radiology Studies: MR FOOT LEFT WO CONTRAST  Result Date: 11/22/2021 CLINICAL DATA:  Foot pain and swelling. Diabetic  with history of prior amputation. EXAM: MRI OF THE LEFT FOOT WITHOUT CONTRAST TECHNIQUE: Multiplanar, multisequence MR imaging of the left foot was performed. No intravenous contrast was administered. COMPARISON:  Multiple recent radiographs and foot CT from 10/09/2021 FINDINGS: Examination is quite limited due to patient motion and poor fat saturation. Surgical changes from prior amputation at the mid first metatarsal level. There is an overlying open wound which appears to extend right down to the bone. Abnormal T1 and T2 signal intensity in the first metatarsal highly suspicious for osteomyelitis. There is also abnormal T1 and T2 signal density in the second and third metatarsal heads consistent with osteomyelitis. Abnormal signal intensity in the second and third proximal phalanges also suspicious for osteomyelitis. Could not exclude septic arthritis at the second and third  MTP joints. Diffuse cellulitis and myofasciitis without obvious discrete drainable soft tissue abscess. IMPRESSION: 1. Limited examination due to patient motion and poor fat saturation. 2. Findings highly suspicious for osteomyelitis involving the first metatarsal, second and third metatarsal heads and the second and third proximal phalanges. 3. Diffuse cellulitis and myofasciitis without obvious discrete drainable soft tissue abscess. Electronically Signed   By: Marijo Sanes M.D.   On: 11/22/2021 16:04   DG Foot Complete Left  Result Date: 11/23/2021 CLINICAL DATA:  Status post partial resection of left foot EXAM: LEFT FOOT - COMPLETE 3+ VIEW COMPARISON:  11/22/2027 FINDINGS: There is interval transmetatarsal amputation of mid foot. Skin staples are seen in place. There are no definite opaque foreign bodies. There are no focal lytic lesions in the visualized bony structures. Plantar spur is seen in calcaneus. Surgical drain is seen in the soft tissues adjacent to the surgical site. IMPRESSION: Status post transmetatarsal amputation of  left foot. Electronically Signed   By: Elmer Picker M.D.   On: 11/23/2021 16:18   US BREAST LTD UNI RIGHT INC AXILLA  Result Date: 11/22/2021 CLINICAL DATA:  Right breast erythema EXAM: LIMITED ULTRASOUND OF THE RIGHT BREAST COMPARISON:  None. FINDINGS: Limited grayscale sonography was performed in the area of focal erythema. These images demonstrate no subcutaneous fluid collection to suggest a subcutaneous abscess. IMPRESSION: No subcutaneous fluid collection identified. Electronically Signed   By: Fidela Salisbury M.D.   On: 11/22/2021 19:38   ECHOCARDIOGRAM COMPLETE  Result Date: 11/23/2021    ECHOCARDIOGRAM REPORT   Patient Name:   Jasmine Richardson Date of Exam: 11/23/2021 Medical Rec #:  [id number]     Height:       67.0 in Accession #:    [id number]    Weight:       231.0 lb Date of Birth:  1961/10/23    BSA:          2.150 m Patient Age:    60 years      BP:           149/90 mmHg Patient Gender: F             HR:           84 bpm. Exam Location:  Inpatient Procedure: 2D Echo, Cardiac Doppler, Color Doppler and Intracardiac            Opacification Agent Indications:    Bacteremia  History:        Patient has prior history of Echocardiogram examinations, most                 recent 12/18/2020. CAD, Arrythmias:Tachycardia; Risk                 Factors:Hypertension, Dyslipidemia and Diabetes.  Sonographer:    Bernadene Person RDCS Referring Phys: [id number] [facility] Normal  1. Left ventricular ejection fraction, by estimation, is 50 to 55%. The left ventricle has low normal function. The LV endocardium is incompletely visualized and it is difficult to assess wall motion. Based on available views, the mid-to-apical inferior  and apical inferoseptal walls are mildly hypokinetic. There is mild concentric left ventricular hypertrophy. Left ventricular diastolic parameters are consistent with Grade I diastolic dysfunction (impaired relaxation).  2. Right ventricular systolic function is normal. The right  ventricular size is normal. There is normal pulmonary artery systolic pressure.  3. The mitral valve is normal in structure. Trivial mitral valve regurgitation. No evidence of mitral stenosis.  4. The aortic  valve is tricuspid. There is mild-to-moderate calcification of the aortic valve. There is mild thickening of the aortic valve. Aortic valve regurgitation is not visualized. Aortic valve sclerosis/calcification is present, without any evidence of aortic stenosis.  5. There is a small, highly echogenic, well circumscribed mass measuring 0.6x1.0cm on the Bruning most consistent with asymmetric calcification. This was also present on prior study and unlikely to represent a vegetation.  6. No valvular vegetations visualized. Consider TEE to exclude infective endocarditis if clinically indicated. Conclusion(s)/Recommendation(s): No evidence of valvular vegetations on this transthoracic echocardiogram. Consider a transesophageal echocardiogram to exclude infective endocarditis if clinically indicated. FINDINGS  Left Ventricle: Left ventricular ejection fraction, by estimation, is 50 to 55%. The left ventricle has low normal function. The left ventricle demonstrates regional wall motion abnormalities. The mid-to-apical inferior and apical inferoseptal walls are  mildly hypokinetic. The left ventricular internal cavity size was normal in size. There is mild concentric left ventricular hypertrophy. Left ventricular diastolic parameters are consistent with Grade I diastolic dysfunction (impaired relaxation). Right Ventricle: The right ventricular size is normal. No increase in right ventricular wall thickness. Right ventricular systolic function is normal. There is normal pulmonary artery systolic pressure. The tricuspid regurgitant velocity is 2.10 m/s, and  with an assumed right atrial pressure of 8 mmHg, the estimated right ventricular systolic pressure is 09.8 mmHg. Left Atrium: Left atrial size was normal in size. Right  Atrium: Right atrial size was normal in size. Pericardium: There is no evidence of pericardial effusion. Mitral Valve: The mitral valve is normal in structure. Mild mitral annular calcification. Trivial mitral valve regurgitation. No evidence of mitral valve stenosis. There is no evidence of mitral valve vegetation. Tricuspid Valve: The tricuspid valve is normal in structure. Tricuspid valve regurgitation is trivial. There is no evidence of tricuspid valve vegetation. Aortic Valve: The aortic valve is tricuspid. There is mild-to-moderate calcification of the aortic valve. There is mild thickening of the aortic valve. Aortic valve regurgitation is not visualized. Aortic valve sclerosis/calcification is present, without  any evidence of aortic stenosis. Pulmonic Valve: The pulmonic valve was normal in structure. Pulmonic valve regurgitation is trivial. There is no evidence of pulmonic valve vegetation. Aorta: The aortic root and ascending aorta are structurally normal, with no evidence of dilitation. Venous: The inferior vena cava was not well visualized. IAS/Shunts: No atrial level shunt detected by color flow Doppler.  LEFT VENTRICLE PLAX 2D LVIDd:         3.80 cm   Diastology LVIDs:         2.40 cm   LV e' medial:    5.34 cm/s LV PW:         1.40 cm   LV E/e' medial:  15.4 LV IVS:        1.20 cm   LV e' lateral:   8.15 cm/s LVOT diam:     2.10 cm   LV E/e' lateral: 10.1 LV SV:         64 LV SV Index:   30 LVOT Area:     3.46 cm  RIGHT VENTRICLE RV S prime:     4.78 cm/s TAPSE (M-mode): 1.2 cm LEFT ATRIUM             Index        RIGHT ATRIUM           Index LA diam:        3.70 cm 1.72 cm/m   RA Area:     15.20 cm  LA Vol (A2C):   42.7 ml 19.86 ml/m  RA Volume:   36.60 ml  17.02 ml/m LA Vol (A4C):   48.0 ml 22.33 ml/m LA Biplane Vol: 46.0 ml 21.40 ml/m  AORTIC VALVE LVOT Vmax:   112.00 cm/s LVOT Vmean:  70.000 cm/s LVOT VTI:    0.184 m  AORTA Ao Root diam: 3.30 cm Ao Asc diam:  3.10 cm MITRAL VALVE                 TRICUSPID VALVE MV Area (PHT): 5.31 cm     TR Peak grad:   17.6 mmHg MV Decel Time: 143 msec     TR Vmax:        210.00 cm/s MV E velocity: 82.00 cm/s MV A velocity: 104.00 cm/s  SHUNTS MV E/A ratio:  0.79         Systemic VTI:  0.18 m                             Systemic Diam: 2.10 cm Gwyndolyn Kaufman MD Electronically signed by Gwyndolyn Kaufman MD Signature Date/Time: 11/23/2021/4:40:43 PM    Final         Scheduled Meds:  amiodarone  200 mg Oral Daily   aspirin EC  81 mg Oral Daily   clopidogrel  75 mg Oral Daily   insulin aspart  0-15 Units Subcutaneous TID WC   insulin aspart  0-5 Units Subcutaneous QHS   insulin aspart  5 Units Subcutaneous TID WC   [START ON 11/25/2021] insulin glargine-yfgn  35 Units Subcutaneous Daily   rosuvastatin  20 mg Oral QHS   sodium chloride flush  3 mL Intravenous Q12H   Continuous Infusions:  sodium chloride Stopped (11/24/21 0137)   ceFEPime (MAXIPIME) IV 2 g (11/24/21 1018)   DAPTOmycin (CUBICIN)  IV Stopped (11/23/21 2104)     LOS: 2 days    Time spent:  62min    Domenic Polite, MD [facility]   11/24/2021, 10:22 AM

## 2021-11-24 NOTE — Progress Notes (Signed)
Mobility Specialist Progress Note:   11/24/21 1539  Mobility  Activity Ambulated in hall  Level of Assistance Contact guard assist, steadying assist  Assistive Device Front wheel walker  Distance Ambulated (ft) 60 ft  Mobility Ambulated with assistance in hallway  Mobility Response Tolerated well  Mobility performed by Mobility specialist  $Mobility charge 1 Mobility   Pt received in bed willing to participate in mobility. Complaints of 4/10 L foot pain. Pt returned to bed with call bell in reach and all needs met.   [facility] Public librarian Phone [phone] Secondary Phone [phone]

## 2021-11-24 NOTE — Discharge Instructions (Signed)
  How to download link for Freestyle Libre:  Go into app store Type "Freestyle Libre 2" [employer] Once downloaded, open the app.  Click through and accept the user agreements.  Set up account.  Select option in "servings" when prompted  Apply Freestyle libre sensor as instructed Scan the sensor with the back of the cell phone. Wait for the aloted 60 minutes for "warm up" period. Then begin using to scan blood sugars.  Reach out to the doctor if consistently elevated above 180's mg/dL.

## 2021-11-24 NOTE — Progress Notes (Signed)
[facility]  Date of Admission:  11/21/2021   Total days of inpatient antibiotics 4  Principal Problem:   Diabetic foot infection ([place]) Active Problems:   Chronic diastolic heart failure (HCC)   Coronary artery disease involving native coronary artery of native heart with angina pectoris (HCC)   Essential hypertension   GERD (gastroesophageal reflux disease)   S/P CABG (coronary artery bypass graft)   Ventricular tachycardia   ICD (implantable cardioverter-defibrillator) in place   Hyperglycemia due to type 2 diabetes mellitus ([place])   Bacteremia      Assessment:    60 year old female with uncontrolled diabetes with A1c 12.8, diabetic left foot wounds status post I&D on 10/10/21 followed by podiatry, V. tach status post ICD implantation admitted for sepsis secondary to left foot wound.  ID is following due to MRSA bacteremia.   # MRSA Bacteremia 2/2 diabetic left foot wound SP left transmetatarsal amputation with Cx pending #ICD #Uncontrolled diabetes #Vancomycin allergy #Breast cellulitis SP I&D on 10/23/21 Procedures prior to hospitalization: -SP 1st metatarsal revision on 05/02/21 with residual OR Cx+ staph aureus, treated with bactrim x 2 weeks. Suspect pt had residual OM and did not receive antibiotics past 2 weeks.  -I&D Cx of left foot wound from 10/10/21 are form deep wound, does not appear they are from bone. Treated with 2 weeks of antibiotics(augment/fluconazole x7 days). - MRI of left foot on 11/22/21 showed suspicion for osteomyelitis of 1st/2nd/3rd metatarsal with diffuse cellulitis and myofascitis. Pt went to OR on 11/23/21 for left transmetatarsal amputation. -Pt reports right breast erythema started at the same time as her foot worsened.  There is a note by Dr. Noberto Retort on 10/23/21([facility] in [place])where right breast wound was debrided (gangrenous skin/subcutaneous tissue). [place]  of breast on 12/7 no sign of abscess. -TTE  showed 0.6x1 cm mass on LCC most consistent with asymmetric calcification, seen on prior study. Would obtain TEE in  the setting of MRSA bacteremia Recommendations: -Continue  daptomycin  -Continue  cefepime as imaging showed cellulitis/myofascitis. If OR Cx remain negative would peel off cefepime and leave pt on daptomycin.  -Ordered TEE -Follow blood Cx form 12/7 to ensure clearance -Follow OR Cx Microbiology:   Antibiotics: Linezolid 12/06-12/7 Pip-tazo 12/06 Cefepime 12/8-p Daptomycin 12/07-p  Cultures: Blood 12/06 + MRSA   12/8 OR Cx pending, gram stain: no organisms SUBJECTIVE: Pt is resting in bed complaining of pain in her leg, requesting paine medication.  Interval: afebrile overnight, wbc 12.9k Review of Systems: Review of Systems  All other systems reviewed and are negative.   Scheduled Meds:  amiodarone  200 mg Oral Daily   aspirin EC  81 mg Oral Daily   clopidogrel  75 mg Oral Daily   insulin aspart  0-15 Units Subcutaneous TID WC   insulin aspart  0-5 Units Subcutaneous QHS   insulin aspart  5 Units Subcutaneous TID WC   [START ON 11/25/2021] insulin glargine-yfgn  35 Units Subcutaneous Daily   rosuvastatin  20 mg Oral QHS   sodium chloride flush  3 mL Intravenous Q12H   Continuous Infusions:  sodium chloride Stopped (11/24/21 0137)   ceFEPime (MAXIPIME) IV 2 g (11/24/21 1018)   DAPTOmycin (CUBICIN)  IV Stopped (11/23/21 2104)   PRN Meds:.sodium chloride, acetaminophen **OR** acetaminophen, calcium carbonate, HYDROmorphone (DILAUDID) injection, polyethylene glycol Allergies  Allergen Reactions   Chlorhexidine Gluconate Itching    Received CHG bath, began itching, required benadryl   Cat  Hair Extract Other (See Comments)    Sneezing, watery eyes.   Tramadol Other (See Comments)    Hallucinations   Vancomycin     Other reaction(s): Anaphylaxis*   Codeine Rash    OBJECTIVE: Vitals:   11/24/21 0012 11/24/21 0300 11/24/21 0423 11/24/21 1111  BP:  130/69  135/70 134/63  Pulse: 93  93 78  Resp: 16  18 18   Temp: (!) 100.5 F (38.1 C)  99.1 F (37.3 C) 98.9 F (37.2 C)  TempSrc: Oral  Oral Oral  SpO2: 94%  99% 96%  Weight:  109.7 kg    Height:       Body mass index is 37.88 kg/m.  Physical Exam    Lab Results Lab Results  Component Value Date   WBC 12.9 (H) 11/24/2021   HGB 8.5 (L) 11/24/2021   HCT 27.4 (L) 11/24/2021   MCV 85.9 11/24/2021   PLT 379 11/24/2021    Lab Results  Component Value Date   CREATININE 1.17 (H) 11/24/2021   BUN 17 11/24/2021   NA 132 (L) 11/24/2021   K 4.4 11/24/2021   CL 99 11/24/2021   CO2 25 11/24/2021    Lab Results  Component Value Date   ALT 19 11/21/2021   AST 21 11/21/2021   ALKPHOS 54 11/21/2021   BILITOT 0.7 11/21/2021        Laurice Record, [facility] 11/24/2021, 11:47 AM

## 2021-11-24 NOTE — Progress Notes (Signed)
    [facility] has been requested to perform a transesophageal echocardiogram on Ms. Fairbank for Bacteremia.  After careful review of history and examination, the risks and benefits of transesophageal echocardiogram have been explained including risks of esophageal damage, perforation (1:10,000 risk), bleeding, pharyngeal hematoma as well as other potential complications associated with conscious sedation including aspiration, arrhythmia, respiratory failure and death. Alternatives to treatment were discussed, questions were answered. Patient is willing to proceed.  TEE - Dr. Harrington Challenger 11/27/21 @ 1400 . NPO after midnight. Meds with sips.   Leanor Kail, PA-C 11/24/2021 5:33 PM

## 2021-11-24 NOTE — Progress Notes (Signed)
Orthopedic Tech Progress Note Patient Details:  Jasmine Richardson Mar 30, 1961 [id number]  Ortho Devices Type of Ortho Device: Darco shoe Ortho Device/Splint Location: rle Ortho Device/Splint Interventions: Rocky Morel 11/24/2021, 5:55 PM

## 2021-11-25 LAB — GLUCOSE, CAPILLARY
Glucose-Capillary: 254 mg/dL — ABNORMAL HIGH (ref 70–99)
Glucose-Capillary: 319 mg/dL — ABNORMAL HIGH (ref 70–99)
Glucose-Capillary: 249 mg/dL — ABNORMAL HIGH (ref 70–99)
Glucose-Capillary: 234 mg/dL — ABNORMAL HIGH (ref 70–99)

## 2021-11-25 MED ORDER — INSULIN ASPART 100 UNIT/ML IJ SOLN
6.0000 [IU] | Freq: Three times a day (TID) | INTRAMUSCULAR | Status: DC
Start: 2021-11-25 — End: 2021-11-28
  Administered 2021-11-25 – 2021-11-28 (×12): 6 [IU] via SUBCUTANEOUS

## 2021-11-25 MED ORDER — PANTOPRAZOLE SODIUM 40 MG PO TBEC
40.0000 mg | DELAYED_RELEASE_TABLET | Freq: Every day | ORAL | Status: DC
  Administered 2021-11-25 – 2021-11-28 (×4): 40 mg via ORAL
  Filled 2021-11-25 (×4): qty 1

## 2021-11-25 MED ORDER — POLYETHYLENE GLYCOL 3350 17 G PO PACK
17.0000 g | PACK | Freq: Every day | ORAL | Status: DC
  Administered 2021-11-25 – 2021-11-26 (×2): 17 g via ORAL
  Filled 2021-11-25 (×4): qty 1

## 2021-11-25 MED ORDER — FUROSEMIDE 40 MG PO TABS
40.0000 mg | ORAL_TABLET | Freq: Every day | ORAL | Status: DC
  Administered 2021-11-25 – 2021-11-28 (×4): 40 mg via ORAL
  Filled 2021-11-25 (×4): qty 1

## 2021-11-25 MED ORDER — HYDROCERIN EX CREA
TOPICAL_CREAM | Freq: Two times a day (BID) | CUTANEOUS | Status: DC
  Filled 2021-11-25 (×2): qty 113

## 2021-11-25 NOTE — Progress Notes (Signed)
PROGRESS NOTE    Jasmine Richardson  QMG:[id number] DOB: 03-08-1961 DOA: 11/21/2021 PCP: Nicholos Johns, MD  Brief Narrative: 60/F with history of CAD, COPD, chronic systolic/diastolic CHF, type 2 diabetes mellitus, anxiety, depression, history of V. tach, ICD presented to the ED with worsening left foot pain. -Recent left great toe amputation in October, followed by admission for I&D. -In the emergency room she was febrile to 100.9, labs noted WBC of 18, foot x-ray noted fracture base of left second phalanx with displacement -Started on linezolid and Zosyn -ID and podiatry following, 12/8-transmetatarsal amputation -Infectious disease consulting, plan for TEE on 12/12  Assessment & Plan:   Sepsis, POA Left diabetic foot infection, recent toe amputation and debridement Osteomyelitis of toes, metatarsals MRSA bacteremia -Podiatry following, MRI foot noted findings suspicious for osteomyelitis involving first metatarsal, second and third metatarsal heads and the second and third proximal phalanges, myositis, no abscess -Underwent transmetatarsal amputation 12/8 Dr. Blenda Mounts -Continue IV daptomycin, cefepime -Infectious disease consulting, repeat blood cultures 12/9 are negative so far -2D echo noted preserved EF, no vegetations noted, small, highly echogenic, well circumscribed mass measuring  0.6x1.0cm on the [place] most consistent with asymmetric calcification. This  was also present on prior study and unlikely to represent a vegetation -Plan for TEE on Monday 12/12 -Follow-up OR cultures-no growth so far  PAD -Recently underwent aortogram, left anterior tibial artery angioplasty by Dr. Carlis Abbott on 10/26 -Continue aspirin Plavix statin  Postop blood loss anemia -Will check anemia panel, stable, monitor  Type 2 diabetes mellitus -Continue Semglee, NovoLog with meals, dose increased -Hemoglobin A1c is 10.7  Asymptomatic pyuria -Urinalysis abnormal with positive nitrite, small leukocytes, > 50  WBCs, patient denies any symptoms whatsoever, on antibiotics for above  Chronic systolic and diastolic CHF CAD Hypertension -Clinically euvolemic at this time, continue home regimen of aspirin, Plavix,  metoprolol, Lasix -holding valsartan, imdur -Restart Lasix today -Echo with EF of 50-55%  History of V. Tach s/p AICD -Continue amiodarone  History of gangrenous skin/subcutaneous tissue of right breast -s/p excisional debridement by Dr. Leonidas Romberg 11/7 -Now with large scab, no surrounding  tenderness erythema or warmth  DVT prophylaxis: On DAPT with recent surgery, holding anticoagulation Code Status: Full code Family Communication: Discussed with patient in detail, no family at bedside Disposition Plan:  Status is:  inpatient because: Severity of illness  Consultants:  Podiatry, infectious disease  Procedures: Transmetatarsal amputation Dr. Blenda Mounts 12/8   Antimicrobials:    Subjective: -Upset about issues last night with losing IV etc.  Objective: Vitals:   11/24/21 0423 11/24/21 1111 11/24/21 1957 11/25/21 0355  BP: 135/70 134/63 (!) 146/63 128/72  Pulse: 93 78 97 92  Resp: 18 18 16 16   Temp: 99.1 F (37.3 C) 98.9 F (37.2 C) 99 F (37.2 C) 99.4 F (37.4 C)  TempSrc: Oral Oral Oral Oral  SpO2: 99% 96% 99% 99%  Weight:    107.8 kg  Height:        Intake/Output Summary (Last 24 hours) at 11/25/2021 1053 Last data filed at 11/25/2021 0153 Gross per 24 hour  Intake 653.46 ml  Output 300 ml  Net 353.46 ml   Filed Weights   11/23/21 1147 11/24/21 0300 11/25/21 0355  Weight: 104.8 kg 109.7 kg 107.8 kg    Examination:  Gen: Awake, Alert, Oriented X 3, obese pleasant female, no distress HEENT: no JVD Lungs: Good air movement bilaterally, CTAB CVS: S1S2/RRR Abd: soft, Non tender, non distended, BS present Extremities: Left foot with transmetatarsal amputation, dressing  Skin: No rash on exposed skin  psychiatry:  Mood & affect appropriate.   Data  Reviewed:   CBC: Recent Labs  Lab 11/21/21 1828 11/23/21 0439 11/24/21 0230  WBC 18.1* 17.2* 12.9*  NEUTROABS 15.3*  --   --   HGB 10.7* 10.0* 8.5*  HCT 34.8* 33.0* 27.4*  MCV 86.1 86.2 85.9  PLT 475* 446* 412   Basic Metabolic Panel: Recent Labs  Lab 11/21/21 1828 11/23/21 0439 11/24/21 0230  NA 133* 134* 132*  K 4.5 4.1 4.4  CL 98 100 99  CO2 25 24 25   GLUCOSE 272* 163* 209*  BUN 16 12 17   CREATININE 0.96 1.04* 1.17*  CALCIUM 9.7 9.4 9.0   GFR: Estimated Creatinine Clearance: 63.8 mL/min (A) (by C-G formula based on SCr of 1.17 mg/dL (H)). Liver Function Tests: Recent Labs  Lab 11/21/21 1828  AST 21  ALT 19  ALKPHOS 54  BILITOT 0.7  PROT 8.2*  ALBUMIN 3.1*   Recent Labs  Lab 11/21/21 1828  LIPASE 25   No results for input(s): AMMONIA in the last 168 hours. Coagulation Profile: No results for input(s): INR, PROTIME in the last 168 hours. Cardiac Enzymes: Recent Labs  Lab 11/23/21 0439  CKTOTAL 16*   BNP (last 3 results) Recent Labs    01/12/21 1013  PROBNP 246   HbA1C: Recent Labs    11/23/21 0439  HGBA1C 10.7*   CBG: Recent Labs  Lab 11/24/21 0614 11/24/21 1113 11/24/21 1627 11/24/21 2220 11/25/21 0610  GLUCAP 190* 238* 296* 306* 254*   Lipid Profile: No results for input(s): CHOL, HDL, LDLCALC, TRIG, CHOLHDL, LDLDIRECT in the last 72 hours. Thyroid Function Tests: No results for input(s): TSH, T4TOTAL, FREET4, T3FREE, THYROIDAB in the last 72 hours. Anemia Panel: No results for input(s): VITAMINB12, FOLATE, FERRITIN, TIBC, IRON, RETICCTPCT in the last 72 hours. Urine analysis:    Component Value Date/Time   COLORURINE YELLOW 11/23/2021 0439   APPEARANCEUR CLEAR 11/23/2021 0439   LABSPEC 1.025 11/23/2021 0439   PHURINE 6.0 11/23/2021 0439   GLUCOSEU >=500 (A) 11/23/2021 0439   HGBUR TRACE (A) 11/23/2021 0439   BILIRUBINUR NEGATIVE 11/23/2021 0439   KETONESUR NEGATIVE 11/23/2021 0439   PROTEINUR >300 (A) 11/23/2021 0439    NITRITE POSITIVE (A) 11/23/2021 0439   LEUKOCYTESUR SMALL (A) 11/23/2021 0439   Sepsis Labs: @LABRCNTIP (procalcitonin:4,lacticidven:4)  ) Recent Results (from the past 240 hour(s))  Resp Panel by RT-PCR (Flu A&B, Covid) Nasopharyngeal Swab     Status: None   Collection Time: 11/21/21  6:18 PM   Specimen: Nasopharyngeal Swab; Nasopharyngeal(NP) swabs in vial transport medium  Result Value Ref Range Status   SARS Coronavirus 2 by RT PCR NEGATIVE NEGATIVE Final    Comment: (NOTE) SARS-CoV-2 target nucleic acids are NOT DETECTED.  The SARS-CoV-2 RNA is generally detectable in upper respiratory specimens during the acute phase of infection. The lowest concentration of SARS-CoV-2 viral copies this assay can detect is 138 copies/mL. A negative result does not preclude SARS-Cov-2 infection and should not be used as the sole basis for treatment or other patient management decisions. A negative result may occur with  improper specimen collection/handling, submission of specimen other than nasopharyngeal swab, presence of viral mutation(s) within the areas targeted by this assay, and inadequate number of viral copies(<138 copies/mL). A negative result must be combined with clinical observations, patient history, and epidemiological information. The expected result is Negative.  Fact Sheet for Patients:  EntrepreneurPulse.com.au  Fact Sheet for Healthcare Providers:  IncredibleEmployment.be  This test is no t yet approved or cleared by the [place] and  has been authorized for detection and/or diagnosis of SARS-CoV-2 by FDA under an Emergency Use Authorization (EUA). This EUA will remain  in effect (meaning this test can be used) for the duration of the COVID-19 declaration under Section 564(b)(1) of the Act, 21 U.S.C.section 360bbb-3(b)(1), unless the authorization is terminated  or revoked sooner.       Influenza A by PCR NEGATIVE  NEGATIVE Final   Influenza B by PCR NEGATIVE NEGATIVE Final    Comment: (NOTE) The Xpert Xpress SARS-CoV-2/FLU/RSV plus assay is intended as an aid in the diagnosis of influenza from Nasopharyngeal swab specimens and should not be used as a sole basis for treatment. Nasal washings and aspirates are unacceptable for Xpert Xpress SARS-CoV-2/FLU/RSV testing.  Fact Sheet for Patients: EntrepreneurPulse.com.au  Fact Sheet for Healthcare Providers: IncredibleEmployment.be  This test is not yet approved or cleared by the [place] FDA and has been authorized for detection and/or diagnosis of SARS-CoV-2 by FDA under an Emergency Use Authorization (EUA). This EUA will remain in effect (meaning this test can be used) for the duration of the COVID-19 declaration under Section 564(b)(1) of the Act, 21 U.S.C. section 360bbb-3(b)(1), unless the authorization is terminated or revoked.  Performed at [facility], [place] [address] [place], [place] [address]   Blood culture (routine x 2)     Status: Abnormal   Collection Time: 11/21/21  6:28 PM   Specimen: Site Not Specified; Blood  Result Value Ref Range Status   Specimen Description SITE NOT SPECIFIED  Final   Special Requests   Final    BOTTLES DRAWN AEROBIC AND ANAEROBIC Blood Culture results may not be optimal due to an excessive volume of blood received in culture bottles   Culture  Setup Time   Final    GRAM POSITIVE COCCI IN CLUSTERS IN BOTH AEROBIC AND ANAEROBIC BOTTLES CRITICAL RESULT CALLED TO, READ BACK BY AND VERIFIED WITH: Keith Rake PHARMD 1340 11/22/21 A BROWNING Performed at [facility], [place] [address] [place], [place] [address]    Culture METHICILLIN RESISTANT STAPHYLOCOCCUS AUREUS (A)  Final   Report Status 11/24/2021 FINAL  Final   Organism ID, Bacteria METHICILLIN RESISTANT STAPHYLOCOCCUS AUREUS  Final      Susceptibility   Methicillin resistant staphylococcus aureus - MIC*     CIPROFLOXACIN >=8 RESISTANT Resistant     ERYTHROMYCIN >=8 RESISTANT Resistant     GENTAMICIN <=0.5 SENSITIVE Sensitive     OXACILLIN >=4 RESISTANT Resistant     TETRACYCLINE 2 SENSITIVE Sensitive     VANCOMYCIN <=0.5 SENSITIVE Sensitive     TRIMETH/SULFA <=10 SENSITIVE Sensitive     CLINDAMYCIN >=8 RESISTANT Resistant     RIFAMPIN <=0.5 SENSITIVE Sensitive     Inducible Clindamycin NEGATIVE Sensitive     * METHICILLIN RESISTANT STAPHYLOCOCCUS AUREUS  Blood Culture ID Panel (Reflexed)     Status: Abnormal   Collection Time: 11/21/21  6:28 PM  Result Value Ref Range Status   Enterococcus faecalis NOT DETECTED NOT DETECTED Final   Enterococcus Faecium NOT DETECTED NOT DETECTED Final   Listeria monocytogenes NOT DETECTED NOT DETECTED Final   Staphylococcus species DETECTED (A) NOT DETECTED Final    Comment: CRITICAL RESULT CALLED TO, READ BACK BY AND VERIFIED WITH: K PATEL PHARMD 1340 11/22/21 A BROWNING    Staphylococcus aureus (BCID) DETECTED (A) NOT DETECTED Final    Comment: Methicillin (oxacillin)-resistant  Staphylococcus aureus (MRSA). MRSA is predictably resistant to beta-lactam antibiotics (except ceftaroline). Preferred therapy is vancomycin unless clinically contraindicated. Patient requires contact precautions if  hospitalized. CRITICAL RESULT CALLED TO, READ BACK BY AND VERIFIED WITH: K PATEL PHARMD 1340 11/22/21 A BROWNING    Staphylococcus epidermidis NOT DETECTED NOT DETECTED Final   Staphylococcus lugdunensis NOT DETECTED NOT DETECTED Final   Streptococcus species NOT DETECTED NOT DETECTED Final   Streptococcus agalactiae NOT DETECTED NOT DETECTED Final   Streptococcus pneumoniae NOT DETECTED NOT DETECTED Final   Streptococcus pyogenes NOT DETECTED NOT DETECTED Final   A.calcoaceticus-baumannii NOT DETECTED NOT DETECTED Final   Bacteroides fragilis NOT DETECTED NOT DETECTED Final   Enterobacterales NOT DETECTED NOT DETECTED Final   Enterobacter cloacae complex NOT  DETECTED NOT DETECTED Final   Escherichia coli NOT DETECTED NOT DETECTED Final   Klebsiella aerogenes NOT DETECTED NOT DETECTED Final   Klebsiella oxytoca NOT DETECTED NOT DETECTED Final   Klebsiella pneumoniae NOT DETECTED NOT DETECTED Final   Proteus species NOT DETECTED NOT DETECTED Final   Salmonella species NOT DETECTED NOT DETECTED Final   Serratia marcescens NOT DETECTED NOT DETECTED Final   Haemophilus influenzae NOT DETECTED NOT DETECTED Final   Neisseria meningitidis NOT DETECTED NOT DETECTED Final   Pseudomonas aeruginosa NOT DETECTED NOT DETECTED Final   Stenotrophomonas maltophilia NOT DETECTED NOT DETECTED Final   Candida albicans NOT DETECTED NOT DETECTED Final   Candida auris NOT DETECTED NOT DETECTED Final   Candida glabrata NOT DETECTED NOT DETECTED Final   Candida krusei NOT DETECTED NOT DETECTED Final   Candida parapsilosis NOT DETECTED NOT DETECTED Final   Candida tropicalis NOT DETECTED NOT DETECTED Final   Cryptococcus neoformans/gattii NOT DETECTED NOT DETECTED Final   Meth resistant mecA/C and MREJ DETECTED (A) NOT DETECTED Final    Comment: CRITICAL RESULT CALLED TO, READ BACK BY AND VERIFIED WITH: K PATEL PHARMD 1340 11/22/21 A BROWNING Performed at [facility], [address], [place], [place] [address]   Surgical pcr screen     Status: None   Collection Time: 11/23/21 12:37 AM   Specimen: Nasal Mucosa; Nasal Swab  Result Value Ref Range Status   MRSA, PCR NEGATIVE NEGATIVE Final   Staphylococcus aureus NEGATIVE NEGATIVE Final    Comment: (NOTE) The Xpert SA Assay (FDA approved for NASAL specimens in patients 34 years of age and older), is one component of a comprehensive surveillance program. It is not intended to diagnose infection nor to guide or monitor treatment. Performed at [facility], [place] [address], [place], [place] [address]   Aerobic/Anaerobic Culture w Gram Stain (surgical/deep wound)     Status: None (Preliminary result)    Collection Time: 11/23/21 12:13 PM   Specimen: PATH Other; Body Fluid  Result Value Ref Range Status   Specimen Description FLUID  Final   Special Requests LEFT FOOT WOUND SPEC A  Final   Gram Stain NO RBC SEEN NO ORGANISMS SEEN   Final   Culture   Final    NO GROWTH < 24 HOURS Performed at [facility], [place] [address], [place], [place] [address]    Report Status PENDING  Incomplete  Aerobic/Anaerobic Culture w Gram Stain (surgical/deep wound)     Status: None (Preliminary result)   Collection Time: 11/23/21  1:34 PM   Specimen: PATH Other; Body Fluid  Result Value Ref Range Status   Specimen Description TISSUE  Final   Special Requests LEFT FIRST METATARSAL SPEC B  Final   Gram Stain NO WBC SEEN NO ORGANISMS SEEN   Final   Culture   Final    NO GROWTH < 24 HOURS Performed at [facility], [place] [address] [place], [place] [address]    Report Status PENDING  Incomplete  Culture, blood (routine x 2)     Status: None (Preliminary result)   Collection Time: 11/24/21 10:37 AM   Specimen: BLOOD  Result Value Ref Range Status   Specimen Description BLOOD RIGHT ANTECUBITAL  Final   Special Requests   Final    BOTTLES DRAWN AEROBIC ONLY Blood Culture adequate volume   Culture   Final    NO GROWTH < 24 HOURS Performed at [facility], [place] [address], [place], [place] [address]    Report Status PENDING  Incomplete  Culture, blood (routine x 2)     Status: None (Preliminary result)   Collection Time: 11/24/21 10:53 AM   Specimen: BLOOD LEFT HAND  Result Value Ref Range Status   Specimen Description BLOOD LEFT HAND  Final   Special Requests   Final    BOTTLES DRAWN AEROBIC ONLY Blood Culture results may not be optimal due to an inadequate volume of blood received in culture bottles   Culture   Final    NO GROWTH < 24 HOURS Performed at [facility], [place] [address], [place], [place] [address]    Report Status PENDING  Incomplete         Radiology  Studies: DG Foot Complete Left  Result Date: 11/23/2021 CLINICAL DATA:  Status post partial resection of left foot EXAM: LEFT FOOT - COMPLETE 3+ VIEW COMPARISON:  11/22/2027 FINDINGS: There is interval transmetatarsal amputation of mid foot. Skin staples are seen in place. There are no definite opaque foreign bodies. There are no focal lytic lesions in the visualized bony structures. Plantar spur is seen in calcaneus. Surgical drain is seen in the soft tissues adjacent to the surgical site. IMPRESSION: Status post transmetatarsal amputation of left foot. Electronically Signed   By: Elmer Picker M.D.   On: 11/23/2021 16:18   ECHOCARDIOGRAM COMPLETE  Result Date: 11/23/2021    ECHOCARDIOGRAM REPORT   Patient Name:   Jasmine Richardson Date of Exam: 11/23/2021 Medical Rec #:  [id number]     Height:       67.0 in Accession #:    [id number]    Weight:       231.0 lb Date of Birth:  03-Jun-1961    BSA:          2.150 m Patient Age:    60 years      BP:           149/90 mmHg Patient Gender: F             HR:           84 bpm. Exam Location:  Inpatient Procedure: 2D Echo, Cardiac Doppler, Color Doppler and Intracardiac            Opacification Agent Indications:    Bacteremia  History:        Patient has prior history of Echocardiogram examinations, most                 recent 12/18/2020. CAD, Arrythmias:Tachycardia; Risk                 Factors:Hypertension, Dyslipidemia and Diabetes.  Sonographer:    Bernadene Person RDCS Referring Phys: [id number] [facility]  IMPRESSIONS  1. Left ventricular ejection fraction, by estimation, is 50 to 55%. The left ventricle has low normal function. The LV endocardium is incompletely visualized and it is difficult to assess wall motion. Based on available views, the mid-to-apical inferior  and apical inferoseptal walls are mildly hypokinetic. There is mild concentric left ventricular hypertrophy. Left ventricular diastolic parameters are consistent with Grade I diastolic dysfunction  (impaired relaxation).  2. Right ventricular systolic function is normal. The right ventricular size is normal. There is normal pulmonary artery systolic pressure.  3. The mitral valve is normal in structure. Trivial mitral valve regurgitation. No evidence of mitral stenosis.  4. The aortic valve is tricuspid. There is mild-to-moderate calcification of the aortic valve. There is mild thickening of the aortic valve. Aortic valve regurgitation is not visualized. Aortic valve sclerosis/calcification is present, without any evidence of aortic stenosis.  5. There is a small, highly echogenic, well circumscribed mass measuring 0.6x1.0cm on the [place] most consistent with asymmetric calcification. This was also present on prior study and unlikely to represent a vegetation.  6. No valvular vegetations visualized. Consider TEE to exclude infective endocarditis if clinically indicated. Conclusion(s)/Recommendation(s): No evidence of valvular vegetations on this transthoracic echocardiogram. Consider a transesophageal echocardiogram to exclude infective endocarditis if clinically indicated. FINDINGS  Left Ventricle: Left ventricular ejection fraction, by estimation, is 50 to 55%. The left ventricle has low normal function. The left ventricle demonstrates regional wall motion abnormalities. The mid-to-apical inferior and apical inferoseptal walls are  mildly hypokinetic. The left ventricular internal cavity size was normal in size. There is mild concentric left ventricular hypertrophy. Left ventricular diastolic parameters are consistent with Grade I diastolic dysfunction (impaired relaxation). Right Ventricle: The right ventricular size is normal. No increase in right ventricular wall thickness. Right ventricular systolic function is normal. There is normal pulmonary artery systolic pressure. The tricuspid regurgitant velocity is 2.10 m/s, and  with an assumed right atrial pressure of 8 mmHg, the estimated right ventricular  systolic pressure is 54.2 mmHg. Left Atrium: Left atrial size was normal in size. Right Atrium: Right atrial size was normal in size. Pericardium: There is no evidence of pericardial effusion. Mitral Valve: The mitral valve is normal in structure. Mild mitral annular calcification. Trivial mitral valve regurgitation. No evidence of mitral valve stenosis. There is no evidence of mitral valve vegetation. Tricuspid Valve: The tricuspid valve is normal in structure. Tricuspid valve regurgitation is trivial. There is no evidence of tricuspid valve vegetation. Aortic Valve: The aortic valve is tricuspid. There is mild-to-moderate calcification of the aortic valve. There is mild thickening of the aortic valve. Aortic valve regurgitation is not visualized. Aortic valve sclerosis/calcification is present, without  any evidence of aortic stenosis. Pulmonic Valve: The pulmonic valve was normal in structure. Pulmonic valve regurgitation is trivial. There is no evidence of pulmonic valve vegetation. Aorta: The aortic root and ascending aorta are structurally normal, with no evidence of dilitation. Venous: The inferior vena cava was not well visualized. IAS/Shunts: No atrial level shunt detected by color flow Doppler.  LEFT VENTRICLE PLAX 2D LVIDd:         3.80 cm   Diastology LVIDs:         2.40 cm   LV e' medial:    5.34 cm/s LV PW:         1.40 cm   LV E/e' medial:  15.4 LV IVS:        1.20 cm   LV e' lateral:   8.15 cm/s  LVOT diam:     2.10 cm   LV E/e' lateral: 10.1 LV SV:         64 LV SV Index:   30 LVOT Area:     3.46 cm  RIGHT VENTRICLE RV S prime:     4.78 cm/s TAPSE (M-mode): 1.2 cm LEFT ATRIUM             Index        RIGHT ATRIUM           Index LA diam:        3.70 cm 1.72 cm/m   RA Area:     15.20 cm LA Vol (A2C):   42.7 ml 19.86 ml/m  RA Volume:   36.60 ml  17.02 ml/m LA Vol (A4C):   48.0 ml 22.33 ml/m LA Biplane Vol: 46.0 ml 21.40 ml/m  AORTIC VALVE LVOT Vmax:   112.00 cm/s LVOT Vmean:  70.000 cm/s LVOT  VTI:    0.184 m  AORTA Ao Root diam: 3.30 cm Ao Asc diam:  3.10 cm MITRAL VALVE                TRICUSPID VALVE MV Area (PHT): 5.31 cm     TR Peak grad:   17.6 mmHg MV Decel Time: 143 msec     TR Vmax:        210.00 cm/s MV E velocity: 82.00 cm/s MV A velocity: 104.00 cm/s  SHUNTS MV E/A ratio:  0.79         Systemic VTI:  0.18 m                             Systemic Diam: 2.10 cm Gwyndolyn Kaufman MD Electronically signed by Gwyndolyn Kaufman MD Signature Date/Time: 11/23/2021/4:40:43 PM    Final     Scheduled Meds:  amiodarone  200 mg Oral Daily   aspirin EC  81 mg Oral Daily   clopidogrel  75 mg Oral Daily   hydrocerin   Topical BID   insulin aspart  0-15 Units Subcutaneous TID WC   insulin aspart  0-5 Units Subcutaneous QHS   insulin aspart  6 Units Subcutaneous TID WC   insulin glargine-yfgn  35 Units Subcutaneous Daily   polyethylene glycol  17 g Oral Daily   rosuvastatin  20 mg Oral QHS   sodium chloride flush  3 mL Intravenous Q12H   Continuous Infusions:  sodium chloride Stopped (11/24/21 2120)   ceFEPime (MAXIPIME) IV 2 g (11/25/21 0949)   DAPTOmycin (CUBICIN)  IV Stopped (11/24/21 2226)     LOS: 3 days    Time spent:  70min    Domenic Polite, MD [facility]   11/25/2021, 10:53 AM

## 2021-11-25 NOTE — H&P (View-Only) (Signed)
PROGRESS NOTE    Jasmine Richardson  NAT:[id number] DOB: 1961/05/08 DOA: 11/21/2021 PCP: Nicholos Johns, MD  Brief Narrative: 60/F with history of CAD, COPD, chronic systolic/diastolic CHF, type 2 diabetes mellitus, anxiety, depression, history of V. tach, ICD presented to the ED with worsening left foot pain. -Recent left great toe amputation in October, followed by admission for I&D. -In the emergency room she was febrile to 100.9, labs noted WBC of 18, foot x-ray noted fracture base of left second phalanx with displacement -Started on linezolid and Zosyn -ID and podiatry following, 12/8-transmetatarsal amputation -Infectious disease consulting, plan for TEE on 12/12  Assessment & Plan:   Sepsis, POA Left diabetic foot infection, recent toe amputation and debridement Osteomyelitis of toes, metatarsals MRSA bacteremia -Podiatry following, MRI foot noted findings suspicious for osteomyelitis involving first metatarsal, second and third metatarsal heads and the second and third proximal phalanges, myositis, no abscess -Underwent transmetatarsal amputation 12/8 Dr. Blenda Mounts -Continue IV daptomycin, cefepime -Infectious disease consulting, repeat blood cultures 12/9 are negative so far -2D echo noted preserved EF, no vegetations noted, small, highly echogenic, well circumscribed mass measuring  0.6x1.0cm on the [place] most consistent with asymmetric calcification. This  was also present on prior study and unlikely to represent a vegetation -Plan for TEE on Monday 12/12 -Follow-up OR cultures-no growth so far  PAD -Recently underwent aortogram, left anterior tibial artery angioplasty by Dr. Carlis Abbott on 10/26 -Continue aspirin Plavix statin  Postop blood loss anemia -Will check anemia panel, stable, monitor  Type 2 diabetes mellitus -Continue Semglee, NovoLog with meals, dose increased -Hemoglobin A1c is 10.7  Asymptomatic pyuria -Urinalysis abnormal with positive nitrite, small leukocytes, > 50  WBCs, patient denies any symptoms whatsoever, on antibiotics for above  Chronic systolic and diastolic CHF CAD Hypertension -Clinically euvolemic at this time, continue home regimen of aspirin, Plavix,  metoprolol, Lasix -holding valsartan, imdur -Restart Lasix today -Echo with EF of 50-55%  History of V. Tach s/p AICD -Continue amiodarone  History of gangrenous skin/subcutaneous tissue of right breast -s/p excisional debridement by Dr. Leonidas Romberg 11/7 -Now with large scab, no surrounding  tenderness erythema or warmth  DVT prophylaxis: On DAPT with recent surgery, holding anticoagulation Code Status: Full code Family Communication: Discussed with patient in detail, no family at bedside Disposition Plan:  Status is:  inpatient because: Severity of illness  Consultants:  Podiatry, infectious disease  Procedures: Transmetatarsal amputation Dr. Blenda Mounts 12/8   Antimicrobials:    Subjective: -Upset about issues last night with losing IV etc.  Objective: Vitals:   11/24/21 0423 11/24/21 1111 11/24/21 1957 11/25/21 0355  BP: 135/70 134/63 (!) 146/63 128/72  Pulse: 93 78 97 92  Resp: 18 18 16 16   Temp: 99.1 F (37.3 C) 98.9 F (37.2 C) 99 F (37.2 C) 99.4 F (37.4 C)  TempSrc: Oral Oral Oral Oral  SpO2: 99% 96% 99% 99%  Weight:    107.8 kg  Height:        Intake/Output Summary (Last 24 hours) at 11/25/2021 1053 Last data filed at 11/25/2021 0153 Gross per 24 hour  Intake 653.46 ml  Output 300 ml  Net 353.46 ml   Filed Weights   11/23/21 1147 11/24/21 0300 11/25/21 0355  Weight: 104.8 kg 109.7 kg 107.8 kg    Examination:  Gen: Awake, Alert, Oriented X 3, obese pleasant female, no distress HEENT: no JVD Lungs: Good air movement bilaterally, CTAB CVS: S1S2/RRR Abd: soft, Non tender, non distended, BS present Extremities: Left foot with transmetatarsal amputation, dressing  Skin: No rash on exposed skin  psychiatry:  Mood & affect appropriate.   Data  Reviewed:   CBC: Recent Labs  Lab 11/21/21 1828 11/23/21 0439 11/24/21 0230  WBC 18.1* 17.2* 12.9*  NEUTROABS 15.3*  --   --   HGB 10.7* 10.0* 8.5*  HCT 34.8* 33.0* 27.4*  MCV 86.1 86.2 85.9  PLT 475* 446* 458   Basic Metabolic Panel: Recent Labs  Lab 11/21/21 1828 11/23/21 0439 11/24/21 0230  NA 133* 134* 132*  K 4.5 4.1 4.4  CL 98 100 99  CO2 25 24 25   GLUCOSE 272* 163* 209*  BUN 16 12 17   CREATININE 0.96 1.04* 1.17*  CALCIUM 9.7 9.4 9.0   GFR: Estimated Creatinine Clearance: 63.8 mL/min (A) (by C-G formula based on SCr of 1.17 mg/dL (H)). Liver Function Tests: Recent Labs  Lab 11/21/21 1828  AST 21  ALT 19  ALKPHOS 54  BILITOT 0.7  PROT 8.2*  ALBUMIN 3.1*   Recent Labs  Lab 11/21/21 1828  LIPASE 25   No results for input(s): AMMONIA in the last 168 hours. Coagulation Profile: No results for input(s): INR, PROTIME in the last 168 hours. Cardiac Enzymes: Recent Labs  Lab 11/23/21 0439  CKTOTAL 16*   BNP (last 3 results) Recent Labs    01/12/21 1013  PROBNP 246   HbA1C: Recent Labs    11/23/21 0439  HGBA1C 10.7*   CBG: Recent Labs  Lab 11/24/21 0614 11/24/21 1113 11/24/21 1627 11/24/21 2220 11/25/21 0610  GLUCAP 190* 238* 296* 306* 254*   Lipid Profile: No results for input(s): CHOL, HDL, LDLCALC, TRIG, CHOLHDL, LDLDIRECT in the last 72 hours. Thyroid Function Tests: No results for input(s): TSH, T4TOTAL, FREET4, T3FREE, THYROIDAB in the last 72 hours. Anemia Panel: No results for input(s): VITAMINB12, FOLATE, FERRITIN, TIBC, IRON, RETICCTPCT in the last 72 hours. Urine analysis:    Component Value Date/Time   COLORURINE YELLOW 11/23/2021 0439   APPEARANCEUR CLEAR 11/23/2021 0439   LABSPEC 1.025 11/23/2021 0439   PHURINE 6.0 11/23/2021 0439   GLUCOSEU >=500 (A) 11/23/2021 0439   HGBUR TRACE (A) 11/23/2021 0439   BILIRUBINUR NEGATIVE 11/23/2021 0439   KETONESUR NEGATIVE 11/23/2021 0439   PROTEINUR >300 (A) 11/23/2021 0439    NITRITE POSITIVE (A) 11/23/2021 0439   LEUKOCYTESUR SMALL (A) 11/23/2021 0439   Sepsis Labs: @LABRCNTIP (procalcitonin:4,lacticidven:4)  ) Recent Results (from the past 240 hour(s))  Resp Panel by RT-PCR (Flu A&B, Covid) Nasopharyngeal Swab     Status: None   Collection Time: 11/21/21  6:18 PM   Specimen: Nasopharyngeal Swab; Nasopharyngeal(NP) swabs in vial transport medium  Result Value Ref Range Status   SARS Coronavirus 2 by RT PCR NEGATIVE NEGATIVE Final    Comment: (NOTE) SARS-CoV-2 target nucleic acids are NOT DETECTED.  The SARS-CoV-2 RNA is generally detectable in upper respiratory specimens during the acute phase of infection. The lowest concentration of SARS-CoV-2 viral copies this assay can detect is 138 copies/mL. A negative result does not preclude SARS-Cov-2 infection and should not be used as the sole basis for treatment or other patient management decisions. A negative result may occur with  improper specimen collection/handling, submission of specimen other than nasopharyngeal swab, presence of viral mutation(s) within the areas targeted by this assay, and inadequate number of viral copies(<138 copies/mL). A negative result must be combined with clinical observations, patient history, and epidemiological information. The expected result is Negative.  Fact Sheet for Patients:  EntrepreneurPulse.com.au  Fact Sheet for Healthcare Providers:  IncredibleEmployment.be  This test is no t yet approved or cleared by the [place] and  has been authorized for detection and/or diagnosis of SARS-CoV-2 by FDA under an Emergency Use Authorization (EUA). This EUA will remain  in effect (meaning this test can be used) for the duration of the COVID-19 declaration under Section 564(b)(1) of the Act, 21 U.S.C.section 360bbb-3(b)(1), unless the authorization is terminated  or revoked sooner.       Influenza A by PCR NEGATIVE  NEGATIVE Final   Influenza B by PCR NEGATIVE NEGATIVE Final    Comment: (NOTE) The Xpert Xpress SARS-CoV-2/FLU/RSV plus assay is intended as an aid in the diagnosis of influenza from Nasopharyngeal swab specimens and should not be used as a sole basis for treatment. Nasal washings and aspirates are unacceptable for Xpert Xpress SARS-CoV-2/FLU/RSV testing.  Fact Sheet for Patients: EntrepreneurPulse.com.au  Fact Sheet for Healthcare Providers: IncredibleEmployment.be  This test is not yet approved or cleared by the [place] FDA and has been authorized for detection and/or diagnosis of SARS-CoV-2 by FDA under an Emergency Use Authorization (EUA). This EUA will remain in effect (meaning this test can be used) for the duration of the COVID-19 declaration under Section 564(b)(1) of the Act, 21 U.S.C. section 360bbb-3(b)(1), unless the authorization is terminated or revoked.  Performed at [facility], [place] [address] [place], [place] [address]   Blood culture (routine x 2)     Status: Abnormal   Collection Time: 11/21/21  6:28 PM   Specimen: Site Not Specified; Blood  Result Value Ref Range Status   Specimen Description SITE NOT SPECIFIED  Final   Special Requests   Final    BOTTLES DRAWN AEROBIC AND ANAEROBIC Blood Culture results may not be optimal due to an excessive volume of blood received in culture bottles   Culture  Setup Time   Final    GRAM POSITIVE COCCI IN CLUSTERS IN BOTH AEROBIC AND ANAEROBIC BOTTLES CRITICAL RESULT CALLED TO, READ BACK BY AND VERIFIED WITH: Keith Rake PHARMD 1340 11/22/21 A BROWNING Performed at [facility], [place] [address] [place], [place] [address]    Culture METHICILLIN RESISTANT STAPHYLOCOCCUS AUREUS (A)  Final   Report Status 11/24/2021 FINAL  Final   Organism ID, Bacteria METHICILLIN RESISTANT STAPHYLOCOCCUS AUREUS  Final      Susceptibility   Methicillin resistant staphylococcus aureus - MIC*     CIPROFLOXACIN >=8 RESISTANT Resistant     ERYTHROMYCIN >=8 RESISTANT Resistant     GENTAMICIN <=0.5 SENSITIVE Sensitive     OXACILLIN >=4 RESISTANT Resistant     TETRACYCLINE 2 SENSITIVE Sensitive     VANCOMYCIN <=0.5 SENSITIVE Sensitive     TRIMETH/SULFA <=10 SENSITIVE Sensitive     CLINDAMYCIN >=8 RESISTANT Resistant     RIFAMPIN <=0.5 SENSITIVE Sensitive     Inducible Clindamycin NEGATIVE Sensitive     * METHICILLIN RESISTANT STAPHYLOCOCCUS AUREUS  Blood Culture ID Panel (Reflexed)     Status: Abnormal   Collection Time: 11/21/21  6:28 PM  Result Value Ref Range Status   Enterococcus faecalis NOT DETECTED NOT DETECTED Final   Enterococcus Faecium NOT DETECTED NOT DETECTED Final   Listeria monocytogenes NOT DETECTED NOT DETECTED Final   Staphylococcus species DETECTED (A) NOT DETECTED Final    Comment: CRITICAL RESULT CALLED TO, READ BACK BY AND VERIFIED WITH: K PATEL PHARMD 1340 11/22/21 A BROWNING    Staphylococcus aureus (BCID) DETECTED (A) NOT DETECTED Final    Comment: Methicillin (oxacillin)-resistant  Staphylococcus aureus (MRSA). MRSA is predictably resistant to beta-lactam antibiotics (except ceftaroline). Preferred therapy is vancomycin unless clinically contraindicated. Patient requires contact precautions if  hospitalized. CRITICAL RESULT CALLED TO, READ BACK BY AND VERIFIED WITH: K PATEL PHARMD 1340 11/22/21 A BROWNING    Staphylococcus epidermidis NOT DETECTED NOT DETECTED Final   Staphylococcus lugdunensis NOT DETECTED NOT DETECTED Final   Streptococcus species NOT DETECTED NOT DETECTED Final   Streptococcus agalactiae NOT DETECTED NOT DETECTED Final   Streptococcus pneumoniae NOT DETECTED NOT DETECTED Final   Streptococcus pyogenes NOT DETECTED NOT DETECTED Final   A.calcoaceticus-baumannii NOT DETECTED NOT DETECTED Final   Bacteroides fragilis NOT DETECTED NOT DETECTED Final   Enterobacterales NOT DETECTED NOT DETECTED Final   Enterobacter cloacae complex NOT  DETECTED NOT DETECTED Final   Escherichia coli NOT DETECTED NOT DETECTED Final   Klebsiella aerogenes NOT DETECTED NOT DETECTED Final   Klebsiella oxytoca NOT DETECTED NOT DETECTED Final   Klebsiella pneumoniae NOT DETECTED NOT DETECTED Final   Proteus species NOT DETECTED NOT DETECTED Final   Salmonella species NOT DETECTED NOT DETECTED Final   Serratia marcescens NOT DETECTED NOT DETECTED Final   Haemophilus influenzae NOT DETECTED NOT DETECTED Final   Neisseria meningitidis NOT DETECTED NOT DETECTED Final   Pseudomonas aeruginosa NOT DETECTED NOT DETECTED Final   Stenotrophomonas maltophilia NOT DETECTED NOT DETECTED Final   Candida albicans NOT DETECTED NOT DETECTED Final   Candida auris NOT DETECTED NOT DETECTED Final   Candida glabrata NOT DETECTED NOT DETECTED Final   Candida krusei NOT DETECTED NOT DETECTED Final   Candida parapsilosis NOT DETECTED NOT DETECTED Final   Candida tropicalis NOT DETECTED NOT DETECTED Final   Cryptococcus neoformans/gattii NOT DETECTED NOT DETECTED Final   Meth resistant mecA/C and MREJ DETECTED (A) NOT DETECTED Final    Comment: CRITICAL RESULT CALLED TO, READ BACK BY AND VERIFIED WITH: K PATEL PHARMD 1340 11/22/21 A BROWNING Performed at [facility], [address], [place], [place] [address]   Surgical pcr screen     Status: None   Collection Time: 11/23/21 12:37 AM   Specimen: Nasal Mucosa; Nasal Swab  Result Value Ref Range Status   MRSA, PCR NEGATIVE NEGATIVE Final   Staphylococcus aureus NEGATIVE NEGATIVE Final    Comment: (NOTE) The Xpert SA Assay (FDA approved for NASAL specimens in patients 33 years of age and older), is one component of a comprehensive surveillance program. It is not intended to diagnose infection nor to guide or monitor treatment. Performed at [facility], [place] [address], [place], [place] [address]   Aerobic/Anaerobic Culture w Gram Stain (surgical/deep wound)     Status: None (Preliminary result)    Collection Time: 11/23/21 12:13 PM   Specimen: PATH Other; Body Fluid  Result Value Ref Range Status   Specimen Description FLUID  Final   Special Requests LEFT FOOT WOUND SPEC A  Final   Gram Stain NO RBC SEEN NO ORGANISMS SEEN   Final   Culture   Final    NO GROWTH < 24 HOURS Performed at [facility], [place] [address], [place], [place] [address]    Report Status PENDING  Incomplete  Aerobic/Anaerobic Culture w Gram Stain (surgical/deep wound)     Status: None (Preliminary result)   Collection Time: 11/23/21  1:34 PM   Specimen: PATH Other; Body Fluid  Result Value Ref Range Status   Specimen Description TISSUE  Final   Special Requests LEFT FIRST METATARSAL SPEC B  Final   Gram Stain NO WBC SEEN NO ORGANISMS SEEN   Final   Culture   Final    NO GROWTH < 24 HOURS Performed at [facility], [place] [address] [place], [place] [address]    Report Status PENDING  Incomplete  Culture, blood (routine x 2)     Status: None (Preliminary result)   Collection Time: 11/24/21 10:37 AM   Specimen: BLOOD  Result Value Ref Range Status   Specimen Description BLOOD RIGHT ANTECUBITAL  Final   Special Requests   Final    BOTTLES DRAWN AEROBIC ONLY Blood Culture adequate volume   Culture   Final    NO GROWTH < 24 HOURS Performed at [facility], [place] [address] [place], [place] [address]    Report Status PENDING  Incomplete  Culture, blood (routine x 2)     Status: None (Preliminary result)   Collection Time: 11/24/21 10:53 AM   Specimen: BLOOD LEFT HAND  Result Value Ref Range Status   Specimen Description BLOOD LEFT HAND  Final   Special Requests   Final    BOTTLES DRAWN AEROBIC ONLY Blood Culture results may not be optimal due to an inadequate volume of blood received in culture bottles   Culture   Final    NO GROWTH < 24 HOURS Performed at [facility], [place] [address] [place], [place] [address]    Report Status PENDING  Incomplete         Radiology  Studies: DG Foot Complete Left  Result Date: 11/23/2021 CLINICAL DATA:  Status post partial resection of left foot EXAM: LEFT FOOT - COMPLETE 3+ VIEW COMPARISON:  11/22/2027 FINDINGS: There is interval transmetatarsal amputation of mid foot. Skin staples are seen in place. There are no definite opaque foreign bodies. There are no focal lytic lesions in the visualized bony structures. Plantar spur is seen in calcaneus. Surgical drain is seen in the soft tissues adjacent to the surgical site. IMPRESSION: Status post transmetatarsal amputation of left foot. Electronically Signed   By: Elmer Picker M.D.   On: 11/23/2021 16:18   ECHOCARDIOGRAM COMPLETE  Result Date: 11/23/2021    ECHOCARDIOGRAM REPORT   Patient Name:   Jasmine Richardson Date of Exam: 11/23/2021 Medical Rec #:  [id number]     Height:       67.0 in Accession #:    [id number]    Weight:       231.0 lb Date of Birth:  12/12/1961    BSA:          2.150 m Patient Age:    57 years      BP:           149/90 mmHg Patient Gender: F             HR:           84 bpm. Exam Location:  Inpatient Procedure: 2D Echo, Cardiac Doppler, Color Doppler and Intracardiac            Opacification Agent Indications:    Bacteremia  History:        Patient has prior history of Echocardiogram examinations, most                 recent 12/18/2020. CAD, Arrythmias:Tachycardia; Risk                 Factors:Hypertension, Dyslipidemia and Diabetes.  Sonographer:    Bernadene Person RDCS Referring Phys: [id number] [facility]  IMPRESSIONS  1. Left ventricular ejection fraction, by estimation, is 50 to 55%. The left ventricle has low normal function. The LV endocardium is incompletely visualized and it is difficult to assess wall motion. Based on available views, the mid-to-apical inferior  and apical inferoseptal walls are mildly hypokinetic. There is mild concentric left ventricular hypertrophy. Left ventricular diastolic parameters are consistent with Grade I diastolic dysfunction  (impaired relaxation).  2. Right ventricular systolic function is normal. The right ventricular size is normal. There is normal pulmonary artery systolic pressure.  3. The mitral valve is normal in structure. Trivial mitral valve regurgitation. No evidence of mitral stenosis.  4. The aortic valve is tricuspid. There is mild-to-moderate calcification of the aortic valve. There is mild thickening of the aortic valve. Aortic valve regurgitation is not visualized. Aortic valve sclerosis/calcification is present, without any evidence of aortic stenosis.  5. There is a small, highly echogenic, well circumscribed mass measuring 0.6x1.0cm on the [place] most consistent with asymmetric calcification. This was also present on prior study and unlikely to represent a vegetation.  6. No valvular vegetations visualized. Consider TEE to exclude infective endocarditis if clinically indicated. Conclusion(s)/Recommendation(s): No evidence of valvular vegetations on this transthoracic echocardiogram. Consider a transesophageal echocardiogram to exclude infective endocarditis if clinically indicated. FINDINGS  Left Ventricle: Left ventricular ejection fraction, by estimation, is 50 to 55%. The left ventricle has low normal function. The left ventricle demonstrates regional wall motion abnormalities. The mid-to-apical inferior and apical inferoseptal walls are  mildly hypokinetic. The left ventricular internal cavity size was normal in size. There is mild concentric left ventricular hypertrophy. Left ventricular diastolic parameters are consistent with Grade I diastolic dysfunction (impaired relaxation). Right Ventricle: The right ventricular size is normal. No increase in right ventricular wall thickness. Right ventricular systolic function is normal. There is normal pulmonary artery systolic pressure. The tricuspid regurgitant velocity is 2.10 m/s, and  with an assumed right atrial pressure of 8 mmHg, the estimated right ventricular  systolic pressure is 09.3 mmHg. Left Atrium: Left atrial size was normal in size. Right Atrium: Right atrial size was normal in size. Pericardium: There is no evidence of pericardial effusion. Mitral Valve: The mitral valve is normal in structure. Mild mitral annular calcification. Trivial mitral valve regurgitation. No evidence of mitral valve stenosis. There is no evidence of mitral valve vegetation. Tricuspid Valve: The tricuspid valve is normal in structure. Tricuspid valve regurgitation is trivial. There is no evidence of tricuspid valve vegetation. Aortic Valve: The aortic valve is tricuspid. There is mild-to-moderate calcification of the aortic valve. There is mild thickening of the aortic valve. Aortic valve regurgitation is not visualized. Aortic valve sclerosis/calcification is present, without  any evidence of aortic stenosis. Pulmonic Valve: The pulmonic valve was normal in structure. Pulmonic valve regurgitation is trivial. There is no evidence of pulmonic valve vegetation. Aorta: The aortic root and ascending aorta are structurally normal, with no evidence of dilitation. Venous: The inferior vena cava was not well visualized. IAS/Shunts: No atrial level shunt detected by color flow Doppler.  LEFT VENTRICLE PLAX 2D LVIDd:         3.80 cm   Diastology LVIDs:         2.40 cm   LV e' medial:    5.34 cm/s LV PW:         1.40 cm   LV E/e' medial:  15.4 LV IVS:        1.20 cm   LV e' lateral:   8.15 cm/s  LVOT diam:     2.10 cm   LV E/e' lateral: 10.1 LV SV:         64 LV SV Index:   30 LVOT Area:     3.46 cm  RIGHT VENTRICLE RV S prime:     4.78 cm/s TAPSE (M-mode): 1.2 cm LEFT ATRIUM             Index        RIGHT ATRIUM           Index LA diam:        3.70 cm 1.72 cm/m   RA Area:     15.20 cm LA Vol (A2C):   42.7 ml 19.86 ml/m  RA Volume:   36.60 ml  17.02 ml/m LA Vol (A4C):   48.0 ml 22.33 ml/m LA Biplane Vol: 46.0 ml 21.40 ml/m  AORTIC VALVE LVOT Vmax:   112.00 cm/s LVOT Vmean:  70.000 cm/s LVOT  VTI:    0.184 m  AORTA Ao Root diam: 3.30 cm Ao Asc diam:  3.10 cm MITRAL VALVE                TRICUSPID VALVE MV Area (PHT): 5.31 cm     TR Peak grad:   17.6 mmHg MV Decel Time: 143 msec     TR Vmax:        210.00 cm/s MV E velocity: 82.00 cm/s MV A velocity: 104.00 cm/s  SHUNTS MV E/A ratio:  0.79         Systemic VTI:  0.18 m                             Systemic Diam: 2.10 cm Gwyndolyn Kaufman MD Electronically signed by Gwyndolyn Kaufman MD Signature Date/Time: 11/23/2021/4:40:43 PM    Final     Scheduled Meds:  amiodarone  200 mg Oral Daily   aspirin EC  81 mg Oral Daily   clopidogrel  75 mg Oral Daily   hydrocerin   Topical BID   insulin aspart  0-15 Units Subcutaneous TID WC   insulin aspart  0-5 Units Subcutaneous QHS   insulin aspart  6 Units Subcutaneous TID WC   insulin glargine-yfgn  35 Units Subcutaneous Daily   polyethylene glycol  17 g Oral Daily   rosuvastatin  20 mg Oral QHS   sodium chloride flush  3 mL Intravenous Q12H   Continuous Infusions:  sodium chloride Stopped (11/24/21 2120)   ceFEPime (MAXIPIME) IV 2 g (11/25/21 0949)   DAPTOmycin (CUBICIN)  IV Stopped (11/24/21 2226)     LOS: 3 days    Time spent:  29min    Domenic Polite, MD [facility]   11/25/2021, 10:53 AM

## 2021-11-25 NOTE — Progress Notes (Signed)
Pharmacy Antibiotic Note  Jasmine Richardson is a 60 y.o. female admitted on 11/21/2021 with left foot infection. s/p prior toe amputations and cellulitis with poor wound healing and increased draining associated with fever/chills. She was found to have 1 of 4 blood cultures growing MRSA. She is now post-op day 2 s/p left transmetatarsal amputation.   Plan: Continue daptomycin 650 mg daily Continue cefepime 2 g every 8 hours Monitor CBC, renal function, cultures, and clinical progress  Height: 5\' 7"  (170.2 cm) Weight: 107.8 kg (237 lb 9.6 oz) IBW/kg (Calculated) : 61.6  Temp (24hrs), Avg:99.1 F (37.3 C), Min:98.9 F (37.2 C), Max:99.4 F (37.4 C)  Recent Labs  Lab 11/21/21 1828 11/23/21 0439 11/24/21 0230  WBC 18.1* 17.2* 12.9*  CREATININE 0.96 1.04* 1.17*  LATICACIDVEN 1.7  --   --     Estimated Creatinine Clearance: 63.8 mL/min (A) (by C-G formula based on SCr of 1.17 mg/dL (H)).    Allergies  Allergen Reactions   Chlorhexidine Gluconate Itching    Received CHG bath, began itching, required benadryl   Cat Hair Extract Other (See Comments)    Sneezing, watery eyes.   Tramadol Other (See Comments)    Hallucinations   Vancomycin     Other reaction(s): Anaphylaxis*   Codeine Rash    Antimicrobials this admission: Cefepime 12/8>> Zosyn 12/6>>12/7 Zyvox 12/6>12/7 Dapto 12/7>>  Microbiology results: 12/6 BCx: 1/4 bottles MRSA 12/8 wound cx: pending  12/8 MRSA PCR: negative 12/9 BCx: pending  Thank you for allowing pharmacy to be a part of this patient's care.  Zenaida Deed, PharmD PGY1 Acute Care Pharmacy Resident  Phone: [phone] 11/25/2021  9:01 AM  Please check [email or web address] for unit-specific pharmacy phone numbers.

## 2021-11-26 LAB — GLUCOSE, CAPILLARY
Glucose-Capillary: 228 mg/dL — ABNORMAL HIGH (ref 70–99)
Glucose-Capillary: 216 mg/dL — ABNORMAL HIGH (ref 70–99)
Glucose-Capillary: 254 mg/dL — ABNORMAL HIGH (ref 70–99)
Glucose-Capillary: 237 mg/dL — ABNORMAL HIGH (ref 70–99)

## 2021-11-26 LAB — BASIC METABOLIC PANEL
Anion gap: 9 (ref 5–15)
BUN: 10 mg/dL (ref 8–23)
CO2: 23 mmol/L (ref 22–32)
Calcium: 9 mg/dL (ref 8.9–10.3)
Chloride: 102 mmol/L (ref 98–111)
Creatinine, Ser: 0.87 mg/dL (ref 0.44–1.00)
GFR, Estimated: >60 mL/min (ref >60)
Glucose, Bld: 240 mg/dL — ABNORMAL HIGH (ref 70–99)
Potassium: 3.7 mmol/L (ref 3.5–5.1)
Sodium: 134 mmol/L — ABNORMAL LOW (ref 135–145)

## 2021-11-26 LAB — CBC
HCT: 23.6 % — ABNORMAL LOW (ref 36.0–46.0)
Hemoglobin: 7.6 g/dL — ABNORMAL LOW (ref 12.0–15.0)
MCH: 26.9 pg (ref 26.0–34.0)
MCHC: 32.2 g/dL (ref 30.0–36.0)
MCV: 83.4 fL (ref 80.0–100.0)
Platelets: 386 10*3/uL (ref 150–400)
RBC: 2.83 MIL/uL — ABNORMAL LOW (ref 3.87–5.11)
RDW: 14.2 % (ref 11.5–15.5)
WBC: 10.9 10*3/uL — ABNORMAL HIGH (ref 4.0–10.5)
nRBC: 0 % (ref 0.0–0.2)

## 2021-11-26 LAB — IRON AND TIBC
Iron: 15 ug/dL — ABNORMAL LOW (ref 28–170)
Saturation Ratios: 6 % — ABNORMAL LOW (ref 10.4–31.8)
TIBC: 255 ug/dL (ref 250–450)
UIBC: 240 ug/dL

## 2021-11-26 LAB — RETICULOCYTES
Immature Retic Fract: 30.1 % — ABNORMAL HIGH (ref 2.3–15.9)
RBC.: 2.84 MIL/uL — ABNORMAL LOW (ref 3.87–5.11)
Retic Count, Absolute: 36.6 10*3/uL (ref 19.0–186.0)
Retic Ct Pct: 1.3 % (ref 0.4–3.1)

## 2021-11-26 LAB — VITAMIN B12: Vitamin B-12: 3031 pg/mL — ABNORMAL HIGH (ref 180–914)

## 2021-11-26 LAB — FERRITIN: Ferritin: 121 ng/mL (ref 11–307)

## 2021-11-26 LAB — FOLATE: Folate: 11.9 ng/mL (ref >5.9)

## 2021-11-26 MED ORDER — SODIUM CHLORIDE 0.9 % IV SOLN
250.0000 mg | Freq: Every day | INTRAVENOUS | Status: DC
  Administered 2021-11-26 – 2021-11-28 (×3): 250 mg via INTRAVENOUS
  Filled 2021-11-26 (×3): qty 20

## 2021-11-26 MED ORDER — SODIUM CHLORIDE 0.9 % IV SOLN: INTRAVENOUS | Status: DC

## 2021-11-26 MED ORDER — INSULIN GLARGINE-YFGN 100 UNIT/ML ~~LOC~~ SOLN
45.0000 [IU] | Freq: Every day | SUBCUTANEOUS | Status: DC
  Administered 2021-11-26 – 2021-11-28 (×3): 45 [IU] via SUBCUTANEOUS
  Filled 2021-11-26 (×3): qty 0.45

## 2021-11-26 MED ORDER — SENNOSIDES-DOCUSATE SODIUM 8.6-50 MG PO TABS
1.0000 | ORAL_TABLET | Freq: Two times a day (BID) | ORAL | Status: DC
  Administered 2021-11-26 – 2021-11-28 (×4): 1 via ORAL
  Filled 2021-11-26 (×5): qty 1

## 2021-11-26 NOTE — Progress Notes (Signed)
PROGRESS NOTE    Jasmine Richardson  QIW:[id number] DOB: [birth year] DOA: 11/21/2021 PCP: Nicholos Johns, MD  Brief Narrative: 60/F with history of CAD, COPD, chronic systolic/diastolic CHF, type 2 diabetes mellitus, anxiety, depression, history of V. tach, ICD presented to the ED with worsening left foot pain. -Recent left great toe amputation in October, followed by admission for I&D. -In the emergency room she was febrile to 100.9, labs noted WBC of 18, foot x-ray noted fracture base of left second phalanx with displacement -Started on linezolid and Zosyn -ID and podiatry following, 12/8-transmetatarsal amputation -Infectious disease consulting, plan for TEE on 12/12  Assessment & Plan:   Sepsis, POA Left diabetic foot infection, recent toe amputation and debridement Osteomyelitis of toes, metatarsals MRSA bacteremia -Podiatry following, MRI foot noted findings suspicious for osteomyelitis involving first metatarsal, second and third metatarsal heads and the second and third proximal phalanges, myositis, no abscess -Underwent transmetatarsal amputation 12/8 Dr. Blenda Mounts -Continue IV daptomycin, cefepime -Infectious disease consulting, repeat blood cultures 12/9 are negative so far -2D echo noted preserved EF, no vegetations noted, small, highly echogenic, well circumscribed mass measuring  0.6x1.0cm on the [place] most consistent with asymmetric calcification. This  was also present on prior study and unlikely to represent a vegetation -Plan for TEE on Monday 12/12 -Intraoperative cultures negative X 2 days  PAD -Recently underwent aortogram, left anterior tibial artery angioplasty by Dr. Carlis Abbott on 10/26 -Continue aspirin Plavix statin  Postop blood loss anemia -Anemia panel with severe iron deficiency, will give IV iron today, hemoglobin down to 7.6, may need blood transfusion if it drops further  Type 2 diabetes mellitus -Continue Semglee, NovoLog with meals, dose increased -Hemoglobin A1c  is 10.7  Asymptomatic pyuria -Urinalysis abnormal with positive nitrite, small leukocytes, > 50 WBCs, patient denies any symptoms whatsoever, on antibiotics for above  Chronic systolic and diastolic CHF CAD Hypertension -Clinically euvolemic at this time, continue home regimen of aspirin, Plavix,  metoprolol, Lasix -holding valsartan, imdur -Restart Lasix today -Echo with EF of 50-55%  History of V. Tach s/p AICD -Continue amiodarone  History of gangrenous skin/subcutaneous tissue of right breast -s/p excisional debridement by Dr. Leonidas Romberg 11/7 -Now with large scab, no surrounding  tenderness erythema or warmth  DVT prophylaxis: On DAPT with recent surgery, holding anticoagulation Code Status: Full code Family Communication: Discussed with patient in detail, no family at bedside Disposition Plan:  Status is:  inpatient because: Severity of illness  Consultants:  Podiatry, infectious disease  Procedures: Transmetatarsal amputation Dr. Blenda Mounts 12/8   Antimicrobials:    Subjective: -Feels okay, temp of 99.8 last night,  Objective: Vitals:   11/25/21 2115 11/26/21 0323 11/26/21 0613 11/26/21 0838  BP: (!) 146/71 136/68  131/74  Pulse: 88 81  83  Resp: 18 16  16   Temp: 99.8 F (37.7 C) 98.2 F (36.8 C)  98.1 F (36.7 C)  TempSrc: Oral Oral  Oral  SpO2: 96% 96%  100%  Weight:   107.1 kg   Height:        Intake/Output Summary (Last 24 hours) at 11/26/2021 1002 Last data filed at 11/26/2021 0829 Gross per 24 hour  Intake 892.34 ml  Output --  Net 892.34 ml   Filed Weights   11/24/21 0300 11/25/21 0355 11/26/21 0613  Weight: 109.7 kg 107.8 kg 107.1 kg    Examination:  Obese pleasant female sitting up in bed, AAOx3, no distress  HEENT: No JVD CVS: S1-S2, regular rate rhythm Lungs: Clear bilaterally Abdomen: Soft, nontender, nondistended,  bowel sounds present  Extremities:  Left foot with transmetatarsal amputation, dressing  Skin: No rash on exposed skin   psychiatry:  Mood & affect appropriate.   Data Reviewed:   CBC: Recent Labs  Lab 11/21/21 1828 11/23/21 0439 11/24/21 0230 11/26/21 0230  WBC 18.1* 17.2* 12.9* 10.9*  NEUTROABS 15.3*  --   --   --   HGB 10.7* 10.0* 8.5* 7.6*  HCT 34.8* 33.0* 27.4* 23.6*  MCV 86.1 86.2 85.9 83.4  PLT 475* 446* 379 643   Basic Metabolic Panel: Recent Labs  Lab 11/21/21 1828 11/23/21 0439 11/24/21 0230 11/26/21 0230  NA 133* 134* 132* 134*  K 4.5 4.1 4.4 3.7  CL 98 100 99 102  CO2 25 24 25 23   GLUCOSE 272* 163* 209* 240*  BUN 16 12 17 10   CREATININE 0.96 1.04* 1.17* 0.87  CALCIUM 9.7 9.4 9.0 9.0   GFR: Estimated Creatinine Clearance: 85.5 mL/min (by C-G formula based on SCr of 0.87 mg/dL). Liver Function Tests: Recent Labs  Lab 11/21/21 1828  AST 21  ALT 19  ALKPHOS 54  BILITOT 0.7  PROT 8.2*  ALBUMIN 3.1*   Recent Labs  Lab 11/21/21 1828  LIPASE 25   No results for input(s): AMMONIA in the last 168 hours. Coagulation Profile: No results for input(s): INR, PROTIME in the last 168 hours. Cardiac Enzymes: Recent Labs  Lab 11/23/21 0439  CKTOTAL 16*   BNP (last 3 results) Recent Labs    01/12/21 1013  PROBNP 246   HbA1C: No results for input(s): HGBA1C in the last 72 hours.  CBG: Recent Labs  Lab 11/25/21 0610 11/25/21 1105 11/25/21 1610 11/25/21 2109 11/26/21 0608  GLUCAP 254* 319* 249* 234* 228*   Lipid Profile: No results for input(s): CHOL, HDL, LDLCALC, TRIG, CHOLHDL, LDLDIRECT in the last 72 hours. Thyroid Function Tests: No results for input(s): TSH, T4TOTAL, FREET4, T3FREE, THYROIDAB in the last 72 hours. Anemia Panel: Recent Labs    11/26/21 0230  VITAMINB12 3,031*  FOLATE 11.9  FERRITIN 121  TIBC 255  IRON 15*  RETICCTPCT 1.3   Urine analysis:    Component Value Date/Time   COLORURINE YELLOW 11/23/2021 0439   APPEARANCEUR CLEAR 11/23/2021 0439   LABSPEC 1.025 11/23/2021 0439   PHURINE 6.0 11/23/2021 0439   GLUCOSEU >=500 (A)  11/23/2021 0439   HGBUR TRACE (A) 11/23/2021 0439   BILIRUBINUR NEGATIVE 11/23/2021 0439   KETONESUR NEGATIVE 11/23/2021 0439   PROTEINUR >300 (A) 11/23/2021 0439   NITRITE POSITIVE (A) 11/23/2021 0439   LEUKOCYTESUR SMALL (A) 11/23/2021 0439   Sepsis Labs: @LABRCNTIP (procalcitonin:4,lacticidven:4)  ) Recent Results (from the past 240 hour(s))  Resp Panel by RT-PCR (Flu A&B, Covid) Nasopharyngeal Swab     Status: None   Collection Time: 11/21/21  6:18 PM   Specimen: Nasopharyngeal Swab; Nasopharyngeal(NP) swabs in vial transport medium  Result Value Ref Range Status   SARS Coronavirus 2 by RT PCR NEGATIVE NEGATIVE Final    Comment: (NOTE) SARS-CoV-2 target nucleic acids are NOT DETECTED.  The SARS-CoV-2 RNA is generally detectable in upper respiratory specimens during the acute phase of infection. The lowest concentration of SARS-CoV-2 viral copies this assay can detect is 138 copies/mL. A negative result does not preclude SARS-Cov-2 infection and should not be used as the sole basis for treatment or other patient management decisions. A negative result may occur with  improper specimen collection/handling, submission of specimen other than nasopharyngeal swab, presence of viral mutation(s) within the areas targeted by  this assay, and inadequate number of viral copies(<138 copies/mL). A negative result must be combined with clinical observations, patient history, and epidemiological information. The expected result is Negative.  Fact Sheet for Patients:  EntrepreneurPulse.com.au  Fact Sheet for Healthcare Providers:  IncredibleEmployment.be  This test is no t yet approved or cleared by the [place] FDA and  has been authorized for detection and/or diagnosis of SARS-CoV-2 by FDA under an Emergency Use Authorization (EUA). This EUA will remain  in effect (meaning this test can be used) for the duration of the COVID-19 declaration under  Section 564(b)(1) of the Act, 21 U.S.C.section 360bbb-3(b)(1), unless the authorization is terminated  or revoked sooner.       Influenza A by PCR NEGATIVE NEGATIVE Final   Influenza B by PCR NEGATIVE NEGATIVE Final    Comment: (NOTE) The Xpert Xpress SARS-CoV-2/FLU/RSV plus assay is intended as an aid in the diagnosis of influenza from Nasopharyngeal swab specimens and should not be used as a sole basis for treatment. Nasal washings and aspirates are unacceptable for Xpert Xpress SARS-CoV-2/FLU/RSV testing.  Fact Sheet for Patients: EntrepreneurPulse.com.au  Fact Sheet for Healthcare Providers: IncredibleEmployment.be  This test is not yet approved or cleared by the [place] FDA and has been authorized for detection and/or diagnosis of SARS-CoV-2 by FDA under an Emergency Use Authorization (EUA). This EUA will remain in effect (meaning this test can be used) for the duration of the COVID-19 declaration under Section 564(b)(1) of the Act, 21 U.S.C. section 360bbb-3(b)(1), unless the authorization is terminated or revoked.  Performed at [facility], [place] [address], [place], [place] [address]   Blood culture (routine x 2)     Status: Abnormal   Collection Time: 11/21/21  6:28 PM   Specimen: Site Not Specified; Blood  Result Value Ref Range Status   Specimen Description SITE NOT SPECIFIED  Final   Special Requests   Final    BOTTLES DRAWN AEROBIC AND ANAEROBIC Blood Culture results may not be optimal due to an excessive volume of blood received in culture bottles   Culture  Setup Time   Final    GRAM POSITIVE COCCI IN CLUSTERS IN BOTH AEROBIC AND ANAEROBIC BOTTLES CRITICAL RESULT CALLED TO, READ BACK BY AND VERIFIED WITH: Keith Rake PHARMD 1340 11/22/21 A BROWNING Performed at [facility], [place] [address], [place], [place] [address]    Culture METHICILLIN RESISTANT STAPHYLOCOCCUS AUREUS (A)  Final   Report Status 11/24/2021  FINAL  Final   Organism ID, Bacteria METHICILLIN RESISTANT STAPHYLOCOCCUS AUREUS  Final      Susceptibility   Methicillin resistant staphylococcus aureus - MIC*    CIPROFLOXACIN >=8 RESISTANT Resistant     ERYTHROMYCIN >=8 RESISTANT Resistant     GENTAMICIN <=0.5 SENSITIVE Sensitive     OXACILLIN >=4 RESISTANT Resistant     TETRACYCLINE 2 SENSITIVE Sensitive     VANCOMYCIN <=0.5 SENSITIVE Sensitive     TRIMETH/SULFA <=10 SENSITIVE Sensitive     CLINDAMYCIN >=8 RESISTANT Resistant     RIFAMPIN <=0.5 SENSITIVE Sensitive     Inducible Clindamycin NEGATIVE Sensitive     * METHICILLIN RESISTANT STAPHYLOCOCCUS AUREUS  Blood Culture ID Panel (Reflexed)     Status: Abnormal   Collection Time: 11/21/21  6:28 PM  Result Value Ref Range Status   Enterococcus faecalis NOT DETECTED NOT DETECTED Final   Enterococcus Faecium NOT DETECTED NOT DETECTED Final   Listeria monocytogenes NOT DETECTED NOT DETECTED Final   Staphylococcus species DETECTED (  A) NOT DETECTED Final    Comment: CRITICAL RESULT CALLED TO, READ BACK BY AND VERIFIED WITH: K PATEL PHARMD 1340 11/22/21 A BROWNING    Staphylococcus aureus (BCID) DETECTED (A) NOT DETECTED Final    Comment: Methicillin (oxacillin)-resistant Staphylococcus aureus (MRSA). MRSA is predictably resistant to beta-lactam antibiotics (except ceftaroline). Preferred therapy is vancomycin unless clinically contraindicated. Patient requires contact precautions if  hospitalized. CRITICAL RESULT CALLED TO, READ BACK BY AND VERIFIED WITH: K PATEL PHARMD 1340 11/22/21 A BROWNING    Staphylococcus epidermidis NOT DETECTED NOT DETECTED Final   Staphylococcus lugdunensis NOT DETECTED NOT DETECTED Final   Streptococcus species NOT DETECTED NOT DETECTED Final   Streptococcus agalactiae NOT DETECTED NOT DETECTED Final   Streptococcus pneumoniae NOT DETECTED NOT DETECTED Final   Streptococcus pyogenes NOT DETECTED NOT DETECTED Final   A.calcoaceticus-baumannii NOT DETECTED  NOT DETECTED Final   Bacteroides fragilis NOT DETECTED NOT DETECTED Final   Enterobacterales NOT DETECTED NOT DETECTED Final   Enterobacter cloacae complex NOT DETECTED NOT DETECTED Final   Escherichia coli NOT DETECTED NOT DETECTED Final   Klebsiella aerogenes NOT DETECTED NOT DETECTED Final   Klebsiella oxytoca NOT DETECTED NOT DETECTED Final   Klebsiella pneumoniae NOT DETECTED NOT DETECTED Final   Proteus species NOT DETECTED NOT DETECTED Final   Salmonella species NOT DETECTED NOT DETECTED Final   Serratia marcescens NOT DETECTED NOT DETECTED Final   Haemophilus influenzae NOT DETECTED NOT DETECTED Final   Neisseria meningitidis NOT DETECTED NOT DETECTED Final   Pseudomonas aeruginosa NOT DETECTED NOT DETECTED Final   Stenotrophomonas maltophilia NOT DETECTED NOT DETECTED Final   Candida albicans NOT DETECTED NOT DETECTED Final   Candida auris NOT DETECTED NOT DETECTED Final   Candida glabrata NOT DETECTED NOT DETECTED Final   Candida krusei NOT DETECTED NOT DETECTED Final   Candida parapsilosis NOT DETECTED NOT DETECTED Final   Candida tropicalis NOT DETECTED NOT DETECTED Final   Cryptococcus neoformans/gattii NOT DETECTED NOT DETECTED Final   Meth resistant mecA/C and MREJ DETECTED (A) NOT DETECTED Final    Comment: CRITICAL RESULT CALLED TO, READ BACK BY AND VERIFIED WITH: K PATEL PHARMD 1340 11/22/21 A BROWNING Performed at [facility], [address] [place], [place] [address]   Surgical pcr screen     Status: None   Collection Time: 11/23/21 12:37 AM   Specimen: Nasal Mucosa; Nasal Swab  Result Value Ref Range Status   MRSA, PCR NEGATIVE NEGATIVE Final   Staphylococcus aureus NEGATIVE NEGATIVE Final    Comment: (NOTE) The Xpert SA Assay (FDA approved for NASAL specimens in patients 51 years of age and older), is one component of a comprehensive surveillance program. It is not intended to diagnose infection nor to guide or monitor treatment. Performed at [facility], [place] [address] [place], [place] [address]   Aerobic/Anaerobic Culture w Gram Stain (surgical/deep wound)     Status: None (Preliminary result)   Collection Time: 11/23/21 12:13 PM   Specimen: PATH Other; Body Fluid  Result Value Ref Range Status   Specimen Description FLUID  Final   Special Requests LEFT FOOT WOUND SPEC A  Final   Gram Stain NO RBC SEEN NO ORGANISMS SEEN   Final   Culture   Final    NO GROWTH 2 DAYS NO ANAEROBES ISOLATED; CULTURE IN PROGRESS FOR 5 DAYS Performed at [facility], [address] [place], [place] [address]    Report Status PENDING  Incomplete  Aerobic/Anaerobic Culture  w Gram Stain (surgical/deep wound)     Status: None (Preliminary result)   Collection Time: 11/23/21  1:34 PM   Specimen: PATH Other; Body Fluid  Result Value Ref Range Status   Specimen Description TISSUE  Final   Special Requests LEFT FIRST METATARSAL SPEC B  Final   Gram Stain NO WBC SEEN NO ORGANISMS SEEN   Final   Culture   Final    NO GROWTH 2 DAYS NO ANAEROBES ISOLATED; CULTURE IN PROGRESS FOR 5 DAYS Performed at [facility], [address] [place], [place] [address]    Report Status PENDING  Incomplete  Culture, blood (routine x 2)     Status: None (Preliminary result)   Collection Time: 11/24/21 10:37 AM   Specimen: BLOOD  Result Value Ref Range Status   Specimen Description BLOOD RIGHT ANTECUBITAL  Final   Special Requests   Final    BOTTLES DRAWN AEROBIC ONLY Blood Culture adequate volume   Culture   Final    NO GROWTH 2 DAYS Performed at [facility], [place] [address] [place], [place] [address]    Report Status PENDING  Incomplete  Culture, blood (routine x 2)     Status: None (Preliminary result)   Collection Time: 11/24/21 10:53 AM   Specimen: BLOOD LEFT HAND  Result Value Ref Range Status   Specimen Description BLOOD LEFT HAND  Final   Special Requests   Final    BOTTLES DRAWN AEROBIC ONLY Blood Culture results may not be  optimal due to an inadequate volume of blood received in culture bottles   Culture   Final    NO GROWTH 2 DAYS Performed at [facility], [place] [address] [place], [place] [address]    Report Status PENDING  Incomplete    Radiology Studies: No results found.  Scheduled Meds:  amiodarone  200 mg Oral Daily   aspirin EC  81 mg Oral Daily   clopidogrel  75 mg Oral Daily   furosemide  40 mg Oral Daily   hydrocerin   Topical BID   insulin aspart  0-15 Units Subcutaneous TID WC   insulin aspart  0-5 Units Subcutaneous QHS   insulin aspart  6 Units Subcutaneous TID WC   insulin glargine-yfgn  45 Units Subcutaneous Daily   pantoprazole  40 mg Oral Daily   polyethylene glycol  17 g Oral Daily   rosuvastatin  20 mg Oral QHS   sodium chloride flush  3 mL Intravenous Q12H   Continuous Infusions:  sodium chloride Stopped (11/24/21 2120)   ceFEPime (MAXIPIME) IV 2 g (11/26/21 0850)   DAPTOmycin (CUBICIN)  IV Stopped (11/25/21 2013)   ferric gluconate (FERRLECIT) IVPB       LOS: 4 days    Time spent:  51min  Domenic Polite, MD [facility]   11/26/2021, 10:02 AM

## 2021-11-26 NOTE — H&P (View-Only) (Signed)
PROGRESS NOTE    Jasmine Richardson  IOX:[id number] DOB: Sep 03, 1961 DOA: 11/21/2021 PCP: Nicholos Johns, MD  Brief Narrative: 60/F with history of CAD, COPD, chronic systolic/diastolic CHF, type 2 diabetes mellitus, anxiety, depression, history of V. tach, ICD presented to the ED with worsening left foot pain. -Recent left great toe amputation in October, followed by admission for I&D. -In the emergency room she was febrile to 100.9, labs noted WBC of 18, foot x-ray noted fracture base of left second phalanx with displacement -Started on linezolid and Zosyn -ID and podiatry following, 12/8-transmetatarsal amputation -Infectious disease consulting, plan for TEE on 12/12  Assessment & Plan:   Sepsis, POA Left diabetic foot infection, recent toe amputation and debridement Osteomyelitis of toes, metatarsals MRSA bacteremia -Podiatry following, MRI foot noted findings suspicious for osteomyelitis involving first metatarsal, second and third metatarsal heads and the second and third proximal phalanges, myositis, no abscess -Underwent transmetatarsal amputation 12/8 Dr. Blenda Mounts -Continue IV daptomycin, cefepime -Infectious disease consulting, repeat blood cultures 12/9 are negative so far -2D echo noted preserved EF, no vegetations noted, small, highly echogenic, well circumscribed mass measuring  0.6x1.0cm on the [place] most consistent with asymmetric calcification. This  was also present on prior study and unlikely to represent a vegetation -Plan for TEE on Monday 12/12 -Intraoperative cultures negative X 2 days  PAD -Recently underwent aortogram, left anterior tibial artery angioplasty by Dr. Carlis Abbott on 10/26 -Continue aspirin Plavix statin  Postop blood loss anemia -Anemia panel with severe iron deficiency, will give IV iron today, hemoglobin down to 7.6, may need blood transfusion if it drops further  Type 2 diabetes mellitus -Continue Semglee, NovoLog with meals, dose increased -Hemoglobin A1c  is 10.7  Asymptomatic pyuria -Urinalysis abnormal with positive nitrite, small leukocytes, > 50 WBCs, patient denies any symptoms whatsoever, on antibiotics for above  Chronic systolic and diastolic CHF CAD Hypertension -Clinically euvolemic at this time, continue home regimen of aspirin, Plavix,  metoprolol, Lasix -holding valsartan, imdur -Restart Lasix today -Echo with EF of 50-55%  History of V. Tach s/p AICD -Continue amiodarone  History of gangrenous skin/subcutaneous tissue of right breast -s/p excisional debridement by Dr. Leonidas Romberg 11/7 -Now with large scab, no surrounding  tenderness erythema or warmth  DVT prophylaxis: On DAPT with recent surgery, holding anticoagulation Code Status: Full code Family Communication: Discussed with patient in detail, no family at bedside Disposition Plan:  Status is:  inpatient because: Severity of illness  Consultants:  Podiatry, infectious disease  Procedures: Transmetatarsal amputation Dr. Blenda Mounts 12/8   Antimicrobials:    Subjective: -Feels okay, temp of 99.8 last night,  Objective: Vitals:   11/25/21 2115 11/26/21 0323 11/26/21 0613 11/26/21 0838  BP: (!) 146/71 136/68  131/74  Pulse: 88 81  83  Resp: 18 16  16   Temp: 99.8 F (37.7 C) 98.2 F (36.8 C)  98.1 F (36.7 C)  TempSrc: Oral Oral  Oral  SpO2: 96% 96%  100%  Weight:   107.1 kg   Height:        Intake/Output Summary (Last 24 hours) at 11/26/2021 1002 Last data filed at 11/26/2021 0829 Gross per 24 hour  Intake 892.34 ml  Output --  Net 892.34 ml   Filed Weights   11/24/21 0300 11/25/21 0355 11/26/21 0613  Weight: 109.7 kg 107.8 kg 107.1 kg    Examination:  Obese pleasant female sitting up in bed, AAOx3, no distress  HEENT: No JVD CVS: S1-S2, regular rate rhythm Lungs: Clear bilaterally Abdomen: Soft, nontender, nondistended,  bowel sounds present  Extremities:  Left foot with transmetatarsal amputation, dressing  Skin: No rash on exposed skin   psychiatry:  Mood & affect appropriate.   Data Reviewed:   CBC: Recent Labs  Lab 11/21/21 1828 11/23/21 0439 11/24/21 0230 11/26/21 0230  WBC 18.1* 17.2* 12.9* 10.9*  NEUTROABS 15.3*  --   --   --   HGB 10.7* 10.0* 8.5* 7.6*  HCT 34.8* 33.0* 27.4* 23.6*  MCV 86.1 86.2 85.9 83.4  PLT 475* 446* 379 073   Basic Metabolic Panel: Recent Labs  Lab 11/21/21 1828 11/23/21 0439 11/24/21 0230 11/26/21 0230  NA 133* 134* 132* 134*  K 4.5 4.1 4.4 3.7  CL 98 100 99 102  CO2 25 24 25 23   GLUCOSE 272* 163* 209* 240*  BUN 16 12 17 10   CREATININE 0.96 1.04* 1.17* 0.87  CALCIUM 9.7 9.4 9.0 9.0   GFR: Estimated Creatinine Clearance: 85.5 mL/min (by C-G formula based on SCr of 0.87 mg/dL). Liver Function Tests: Recent Labs  Lab 11/21/21 1828  AST 21  ALT 19  ALKPHOS 54  BILITOT 0.7  PROT 8.2*  ALBUMIN 3.1*   Recent Labs  Lab 11/21/21 1828  LIPASE 25   No results for input(s): AMMONIA in the last 168 hours. Coagulation Profile: No results for input(s): INR, PROTIME in the last 168 hours. Cardiac Enzymes: Recent Labs  Lab 11/23/21 0439  CKTOTAL 16*   BNP (last 3 results) Recent Labs    01/12/21 1013  PROBNP 246   HbA1C: No results for input(s): HGBA1C in the last 72 hours.  CBG: Recent Labs  Lab 11/25/21 0610 11/25/21 1105 11/25/21 1610 11/25/21 2109 11/26/21 0608  GLUCAP 254* 319* 249* 234* 228*   Lipid Profile: No results for input(s): CHOL, HDL, LDLCALC, TRIG, CHOLHDL, LDLDIRECT in the last 72 hours. Thyroid Function Tests: No results for input(s): TSH, T4TOTAL, FREET4, T3FREE, THYROIDAB in the last 72 hours. Anemia Panel: Recent Labs    11/26/21 0230  VITAMINB12 3,031*  FOLATE 11.9  FERRITIN 121  TIBC 255  IRON 15*  RETICCTPCT 1.3   Urine analysis:    Component Value Date/Time   COLORURINE YELLOW 11/23/2021 0439   APPEARANCEUR CLEAR 11/23/2021 0439   LABSPEC 1.025 11/23/2021 0439   PHURINE 6.0 11/23/2021 0439   GLUCOSEU >=500 (A)  11/23/2021 0439   HGBUR TRACE (A) 11/23/2021 0439   BILIRUBINUR NEGATIVE 11/23/2021 0439   KETONESUR NEGATIVE 11/23/2021 0439   PROTEINUR >300 (A) 11/23/2021 0439   NITRITE POSITIVE (A) 11/23/2021 0439   LEUKOCYTESUR SMALL (A) 11/23/2021 0439   Sepsis Labs: @LABRCNTIP (procalcitonin:4,lacticidven:4)  ) Recent Results (from the past 240 hour(s))  Resp Panel by RT-PCR (Flu A&B, Covid) Nasopharyngeal Swab     Status: None   Collection Time: 11/21/21  6:18 PM   Specimen: Nasopharyngeal Swab; Nasopharyngeal(NP) swabs in vial transport medium  Result Value Ref Range Status   SARS Coronavirus 2 by RT PCR NEGATIVE NEGATIVE Final    Comment: (NOTE) SARS-CoV-2 target nucleic acids are NOT DETECTED.  The SARS-CoV-2 RNA is generally detectable in upper respiratory specimens during the acute phase of infection. The lowest concentration of SARS-CoV-2 viral copies this assay can detect is 138 copies/mL. A negative result does not preclude SARS-Cov-2 infection and should not be used as the sole basis for treatment or other patient management decisions. A negative result may occur with  improper specimen collection/handling, submission of specimen other than nasopharyngeal swab, presence of viral mutation(s) within the areas targeted by  this assay, and inadequate number of viral copies(<138 copies/mL). A negative result must be combined with clinical observations, patient history, and epidemiological information. The expected result is Negative.  Fact Sheet for Patients:  EntrepreneurPulse.com.au  Fact Sheet for Healthcare Providers:  IncredibleEmployment.be  This test is no t yet approved or cleared by the [place] FDA and  has been authorized for detection and/or diagnosis of SARS-CoV-2 by FDA under an Emergency Use Authorization (EUA). This EUA will remain  in effect (meaning this test can be used) for the duration of the COVID-19 declaration under  Section 564(b)(1) of the Act, 21 U.S.C.section 360bbb-3(b)(1), unless the authorization is terminated  or revoked sooner.       Influenza A by PCR NEGATIVE NEGATIVE Final   Influenza B by PCR NEGATIVE NEGATIVE Final    Comment: (NOTE) The Xpert Xpress SARS-CoV-2/FLU/RSV plus assay is intended as an aid in the diagnosis of influenza from Nasopharyngeal swab specimens and should not be used as a sole basis for treatment. Nasal washings and aspirates are unacceptable for Xpert Xpress SARS-CoV-2/FLU/RSV testing.  Fact Sheet for Patients: EntrepreneurPulse.com.au  Fact Sheet for Healthcare Providers: IncredibleEmployment.be  This test is not yet approved or cleared by the [place] FDA and has been authorized for detection and/or diagnosis of SARS-CoV-2 by FDA under an Emergency Use Authorization (EUA). This EUA will remain in effect (meaning this test can be used) for the duration of the COVID-19 declaration under Section 564(b)(1) of the Act, 21 U.S.C. section 360bbb-3(b)(1), unless the authorization is terminated or revoked.  Performed at [facility], [place] [address], [place], [place] [address]   Blood culture (routine x 2)     Status: Abnormal   Collection Time: 11/21/21  6:28 PM   Specimen: Site Not Specified; Blood  Result Value Ref Range Status   Specimen Description SITE NOT SPECIFIED  Final   Special Requests   Final    BOTTLES DRAWN AEROBIC AND ANAEROBIC Blood Culture results may not be optimal due to an excessive volume of blood received in culture bottles   Culture  Setup Time   Final    GRAM POSITIVE COCCI IN CLUSTERS IN BOTH AEROBIC AND ANAEROBIC BOTTLES CRITICAL RESULT CALLED TO, READ BACK BY AND VERIFIED WITH: Keith Rake PHARMD 1340 11/22/21 A BROWNING Performed at [facility], [place] [address], [place], [place] [address]    Culture METHICILLIN RESISTANT STAPHYLOCOCCUS AUREUS (A)  Final   Report Status 11/24/2021  FINAL  Final   Organism ID, Bacteria METHICILLIN RESISTANT STAPHYLOCOCCUS AUREUS  Final      Susceptibility   Methicillin resistant staphylococcus aureus - MIC*    CIPROFLOXACIN >=8 RESISTANT Resistant     ERYTHROMYCIN >=8 RESISTANT Resistant     GENTAMICIN <=0.5 SENSITIVE Sensitive     OXACILLIN >=4 RESISTANT Resistant     TETRACYCLINE 2 SENSITIVE Sensitive     VANCOMYCIN <=0.5 SENSITIVE Sensitive     TRIMETH/SULFA <=10 SENSITIVE Sensitive     CLINDAMYCIN >=8 RESISTANT Resistant     RIFAMPIN <=0.5 SENSITIVE Sensitive     Inducible Clindamycin NEGATIVE Sensitive     * METHICILLIN RESISTANT STAPHYLOCOCCUS AUREUS  Blood Culture ID Panel (Reflexed)     Status: Abnormal   Collection Time: 11/21/21  6:28 PM  Result Value Ref Range Status   Enterococcus faecalis NOT DETECTED NOT DETECTED Final   Enterococcus Faecium NOT DETECTED NOT DETECTED Final   Listeria monocytogenes NOT DETECTED NOT DETECTED Final   Staphylococcus species DETECTED (  A) NOT DETECTED Final    Comment: CRITICAL RESULT CALLED TO, READ BACK BY AND VERIFIED WITH: K PATEL PHARMD 1340 11/22/21 A BROWNING    Staphylococcus aureus (BCID) DETECTED (A) NOT DETECTED Final    Comment: Methicillin (oxacillin)-resistant Staphylococcus aureus (MRSA). MRSA is predictably resistant to beta-lactam antibiotics (except ceftaroline). Preferred therapy is vancomycin unless clinically contraindicated. Patient requires contact precautions if  hospitalized. CRITICAL RESULT CALLED TO, READ BACK BY AND VERIFIED WITH: K PATEL PHARMD 1340 11/22/21 A BROWNING    Staphylococcus epidermidis NOT DETECTED NOT DETECTED Final   Staphylococcus lugdunensis NOT DETECTED NOT DETECTED Final   Streptococcus species NOT DETECTED NOT DETECTED Final   Streptococcus agalactiae NOT DETECTED NOT DETECTED Final   Streptococcus pneumoniae NOT DETECTED NOT DETECTED Final   Streptococcus pyogenes NOT DETECTED NOT DETECTED Final   A.calcoaceticus-baumannii NOT DETECTED  NOT DETECTED Final   Bacteroides fragilis NOT DETECTED NOT DETECTED Final   Enterobacterales NOT DETECTED NOT DETECTED Final   Enterobacter cloacae complex NOT DETECTED NOT DETECTED Final   Escherichia coli NOT DETECTED NOT DETECTED Final   Klebsiella aerogenes NOT DETECTED NOT DETECTED Final   Klebsiella oxytoca NOT DETECTED NOT DETECTED Final   Klebsiella pneumoniae NOT DETECTED NOT DETECTED Final   Proteus species NOT DETECTED NOT DETECTED Final   Salmonella species NOT DETECTED NOT DETECTED Final   Serratia marcescens NOT DETECTED NOT DETECTED Final   Haemophilus influenzae NOT DETECTED NOT DETECTED Final   Neisseria meningitidis NOT DETECTED NOT DETECTED Final   Pseudomonas aeruginosa NOT DETECTED NOT DETECTED Final   Stenotrophomonas maltophilia NOT DETECTED NOT DETECTED Final   Candida albicans NOT DETECTED NOT DETECTED Final   Candida auris NOT DETECTED NOT DETECTED Final   Candida glabrata NOT DETECTED NOT DETECTED Final   Candida krusei NOT DETECTED NOT DETECTED Final   Candida parapsilosis NOT DETECTED NOT DETECTED Final   Candida tropicalis NOT DETECTED NOT DETECTED Final   Cryptococcus neoformans/gattii NOT DETECTED NOT DETECTED Final   Meth resistant mecA/C and MREJ DETECTED (A) NOT DETECTED Final    Comment: CRITICAL RESULT CALLED TO, READ BACK BY AND VERIFIED WITH: K PATEL PHARMD 1340 11/22/21 A BROWNING Performed at [facility], [address], [place], [place] [address]   Surgical pcr screen     Status: None   Collection Time: 11/23/21 12:37 AM   Specimen: Nasal Mucosa; Nasal Swab  Result Value Ref Range Status   MRSA, PCR NEGATIVE NEGATIVE Final   Staphylococcus aureus NEGATIVE NEGATIVE Final    Comment: (NOTE) The Xpert SA Assay (FDA approved for NASAL specimens in patients 57 years of age and older), is one component of a comprehensive surveillance program. It is not intended to diagnose infection nor to guide or monitor treatment. Performed at [facility], [place] [address], [place], [place] [address]   Aerobic/Anaerobic Culture w Gram Stain (surgical/deep wound)     Status: None (Preliminary result)   Collection Time: 11/23/21 12:13 PM   Specimen: PATH Other; Body Fluid  Result Value Ref Range Status   Specimen Description FLUID  Final   Special Requests LEFT FOOT WOUND SPEC A  Final   Gram Stain NO RBC SEEN NO ORGANISMS SEEN   Final   Culture   Final    NO GROWTH 2 DAYS NO ANAEROBES ISOLATED; CULTURE IN PROGRESS FOR 5 DAYS Performed at [facility], [address], [place],  [address]    Report Status PENDING  Incomplete  Aerobic/Anaerobic Culture  w Gram Stain (surgical/deep wound)     Status: None (Preliminary result)   Collection Time: 11/23/21  1:34 PM   Specimen: PATH Other; Body Fluid  Result Value Ref Range Status   Specimen Description TISSUE  Final   Special Requests LEFT FIRST METATARSAL SPEC B  Final   Gram Stain NO WBC SEEN NO ORGANISMS SEEN   Final   Culture   Final    NO GROWTH 2 DAYS NO ANAEROBES ISOLATED; CULTURE IN PROGRESS FOR 5 DAYS Performed at [facility], [address] [place], [place] [address]    Report Status PENDING  Incomplete  Culture, blood (routine x 2)     Status: None (Preliminary result)   Collection Time: 11/24/21 10:37 AM   Specimen: BLOOD  Result Value Ref Range Status   Specimen Description BLOOD RIGHT ANTECUBITAL  Final   Special Requests   Final    BOTTLES DRAWN AEROBIC ONLY Blood Culture adequate volume   Culture   Final    NO GROWTH 2 DAYS Performed at [facility], [place] [address], [place], [place] [address]    Report Status PENDING  Incomplete  Culture, blood (routine x 2)     Status: None (Preliminary result)   Collection Time: 11/24/21 10:53 AM   Specimen: BLOOD LEFT HAND  Result Value Ref Range Status   Specimen Description BLOOD LEFT HAND  Final   Special Requests   Final    BOTTLES DRAWN AEROBIC ONLY Blood Culture results may not be  optimal due to an inadequate volume of blood received in culture bottles   Culture   Final    NO GROWTH 2 DAYS Performed at [facility], [place] [address] [place], [place] [address]    Report Status PENDING  Incomplete    Radiology Studies: No results found.  Scheduled Meds:  amiodarone  200 mg Oral Daily   aspirin EC  81 mg Oral Daily   clopidogrel  75 mg Oral Daily   furosemide  40 mg Oral Daily   hydrocerin   Topical BID   insulin aspart  0-15 Units Subcutaneous TID WC   insulin aspart  0-5 Units Subcutaneous QHS   insulin aspart  6 Units Subcutaneous TID WC   insulin glargine-yfgn  45 Units Subcutaneous Daily   pantoprazole  40 mg Oral Daily   polyethylene glycol  17 g Oral Daily   rosuvastatin  20 mg Oral QHS   sodium chloride flush  3 mL Intravenous Q12H   Continuous Infusions:  sodium chloride Stopped (11/24/21 2120)   ceFEPime (MAXIPIME) IV 2 g (11/26/21 0850)   DAPTOmycin (CUBICIN)  IV Stopped (11/25/21 2013)   ferric gluconate (FERRLECIT) IVPB       LOS: 4 days    Time spent:  37min  Domenic Polite, MD [facility]   11/26/2021, 10:02 AM

## 2021-11-27 ENCOUNTER — Inpatient Hospital Stay (HOSPITAL_COMMUNITY): Payer: Managed Care, Other (non HMO) | Admitting: Anesthesiology

## 2021-11-27 ENCOUNTER — Encounter (HOSPITAL_COMMUNITY): Payer: Self-pay | Admitting: Internal Medicine

## 2021-11-27 ENCOUNTER — Encounter (HOSPITAL_COMMUNITY)
Admission: EM | Disposition: A | Discharge status: Home Health | Payer: Self-pay | Source: Home / Self Care | Attending: Internal Medicine

## 2021-11-27 ENCOUNTER — Inpatient Hospital Stay (HOSPITAL_COMMUNITY): Payer: Managed Care, Other (non HMO)

## 2021-11-27 DIAGNOSIS — R7881 Bacteremia: Secondary | ICD-10-CM

## 2021-11-27 HISTORY — PX: BUBBLE STUDY: SHX6837

## 2021-11-27 HISTORY — PX: TEE WITHOUT CARDIOVERSION: SHX5443

## 2021-11-27 LAB — BASIC METABOLIC PANEL
Anion gap: 8 (ref 5–15)
BUN: 8 mg/dL (ref 8–23)
CO2: 24 mmol/L (ref 22–32)
Calcium: 8.8 mg/dL — ABNORMAL LOW (ref 8.9–10.3)
Chloride: 102 mmol/L (ref 98–111)
Creatinine, Ser: 0.79 mg/dL (ref 0.44–1.00)
GFR, Estimated: >60 mL/min (ref >60)
Glucose, Bld: 215 mg/dL — ABNORMAL HIGH (ref 70–99)
Potassium: 3.8 mmol/L (ref 3.5–5.1)
Sodium: 134 mmol/L — ABNORMAL LOW (ref 135–145)

## 2021-11-27 LAB — GLUCOSE, CAPILLARY
Glucose-Capillary: 215 mg/dL — ABNORMAL HIGH (ref 70–99)
Glucose-Capillary: 184 mg/dL — ABNORMAL HIGH (ref 70–99)
Glucose-Capillary: 149 mg/dL — ABNORMAL HIGH (ref 70–99)
Glucose-Capillary: 227 mg/dL — ABNORMAL HIGH (ref 70–99)

## 2021-11-27 LAB — CBC
HCT: 25.5 % — ABNORMAL LOW (ref 36.0–46.0)
Hemoglobin: 8.1 g/dL — ABNORMAL LOW (ref 12.0–15.0)
MCH: 26.6 pg (ref 26.0–34.0)
MCHC: 31.8 g/dL (ref 30.0–36.0)
MCV: 83.6 fL (ref 80.0–100.0)
Platelets: 487 10*3/uL — ABNORMAL HIGH (ref 150–400)
RBC: 3.05 MIL/uL — ABNORMAL LOW (ref 3.87–5.11)
RDW: 14.3 % (ref 11.5–15.5)
WBC: 12.2 10*3/uL — ABNORMAL HIGH (ref 4.0–10.5)
nRBC: 0 % (ref 0.0–0.2)

## 2021-11-27 LAB — SURGICAL PATHOLOGY

## 2021-11-27 SURGERY — ECHOCARDIOGRAM, TRANSESOPHAGEAL
Anesthesia: Monitor Anesthesia Care

## 2021-11-27 MED ORDER — LIDOCAINE VISCOUS HCL 2 % MT SOLN
OROMUCOSAL | Status: DC | PRN
  Administered 2021-11-27: 10 mL via OROMUCOSAL

## 2021-11-27 MED ORDER — PROPOFOL 500 MG/50ML IV EMUL
INTRAVENOUS | Status: DC | PRN
  Administered 2021-11-27: 150 ug/kg/min via INTRAVENOUS

## 2021-11-27 MED ORDER — SODIUM CHLORIDE 0.9 % IV SOLN: INTRAVENOUS | Status: DC | PRN

## 2021-11-27 MED ORDER — LIDOCAINE VISCOUS HCL 2 % MT SOLN
OROMUCOSAL | Status: AC
  Filled 2021-11-27: qty 15

## 2021-11-27 MED ORDER — LIDOCAINE 2% (20 MG/ML) 5 ML SYRINGE
INTRAMUSCULAR | Status: DC | PRN
  Administered 2021-11-27: 40 mg via INTRAVENOUS

## 2021-11-27 MED ORDER — PROPOFOL 10 MG/ML IV BOLUS
INTRAVENOUS | Status: DC | PRN
  Administered 2021-11-27: 30 mg via INTRAVENOUS

## 2021-11-27 NOTE — Progress Notes (Signed)
Inpatient Diabetes Program Recommendations  AACE/ADA: New Consensus Statement on Inpatient Glycemic Control   Target Ranges:  Prepandial:   less than 140 mg/dL      Peak postprandial:   less than 180 mg/dL (1-2 hours)      Critically ill patients:  140 - 180 mg/dL    Latest Reference Range & Units 11/27/21 06:06 11/27/21 11:00  Glucose-Capillary 70 - 99 mg/dL 215 (H) 184 (H)    Latest Reference Range & Units 11/26/21 06:08 11/26/21 11:51 11/26/21 16:59 11/26/21 21:10  Glucose-Capillary 70 - 99 mg/dL 228 (H) 216 (H) 254 (H) 237 (H)   Review of Glycemic Control  Diabetes history: DM2 Outpatient Diabetes medication: Humalog 10-50 units TID, Tresiba 20 units QD, Metformin 500 mg BID Current orders for Inpatient glycemic control: Semglee 45 units daily, Novolog 6 units TID, Novolog 0-15 units TID with meals, Novolog 0-5 units QHS   Inpatient Diabetes Program Recommendations:    Insulin: Please consider increasing Semglee to 48 units daily and meal coverage to Novolog 8 units TID with meals.  Thanks, Barnie Alderman, RN, MSN, CDE Diabetes Coordinator Inpatient Diabetes Program [phone] (Team Pager from 8am to 5pm)

## 2021-11-27 NOTE — Interval H&P Note (Signed)
History and Physical Interval Note:  11/27/2021 8:36 AM  Jasmine Richardson  has presented today for surgery, with the diagnosis of BACTEREMIA.  The various methods of treatment have been discussed with the patient and family. After consideration of risks, benefits and other options for treatment, the patient has consented to  Procedure(s): TRANSESOPHAGEAL ECHOCARDIOGRAM (TEE) (N/A) as a surgical intervention.  The patient's history has been reviewed, patient examined, no change in status, stable for surgery.  I have reviewed the patient's chart and labs.  Questions were answered to the patient's satisfaction.     Dorris Carnes

## 2021-11-27 NOTE — Anesthesia Postprocedure Evaluation (Signed)
Anesthesia Post Note  Patient: Reagan Behlke  Procedure(s) Performed: TRANSESOPHAGEAL ECHOCARDIOGRAM (TEE) BUBBLE STUDY     Patient location during evaluation: PACU Anesthesia Type: MAC Level of consciousness: awake and alert Pain management: pain level controlled Vital Signs Assessment: post-procedure vital signs reviewed and stable Respiratory status: spontaneous breathing, nonlabored ventilation and respiratory function stable Cardiovascular status: blood pressure returned to baseline and stable Postop Assessment: no apparent nausea or vomiting Anesthetic complications: no   No notable events documented.  Last Vitals:  Vitals:   11/27/21 1405 11/27/21 1415  BP: (!) 116/56 101/67  Pulse: 81 77  Resp: 15 (!) 21  Temp:    SpO2: 98% 100%    Last Pain:  Vitals:   11/27/21 1415  TempSrc:   PainSc: 0-No pain                 Pervis Hocking

## 2021-11-27 NOTE — Transfer of Care (Signed)
Immediate Anesthesia Transfer of Care Note  Patient: Jasmine Richardson  Procedure(s) Performed: TRANSESOPHAGEAL ECHOCARDIOGRAM (TEE) BUBBLE STUDY  Patient Location: Endoscopy Unit  Anesthesia Type:MAC  Level of Consciousness: awake, alert  and oriented  Airway & Oxygen Therapy: Patient Spontanous Breathing  Post-op Assessment: Report given to RN, Post -op Vital signs reviewed and stable and Patient moving all extremities  Post vital signs: Reviewed and stable  Last Vitals:  Vitals Value Taken Time  BP 99/75 11/27/21 1346  Temp 36.6 C 11/27/21 1345  Pulse 82 11/27/21 1352  Resp 18 11/27/21 1352  SpO2 99 % 11/27/21 1352  Vitals shown include unvalidated device data.  Last Pain:  Vitals:   11/27/21 1345  TempSrc: Temporal  PainSc: 0-No pain      Patients Stated Pain Goal: 0 (97/53/00 5110)  Complications: No notable events documented.

## 2021-11-27 NOTE — Anesthesia Preprocedure Evaluation (Addendum)
Anesthesia Evaluation  Patient identified by MRN, date of birth, ID band Patient awake    Reviewed: Allergy & Precautions, NPO status , Patient's Chart, lab work & pertinent test results  Airway Mallampati: IV  TM Distance: >3 FB Neck ROM: Full    Dental  (+) Edentulous Upper, Edentulous Lower   Pulmonary COPD, former smoker,  Quit smoking 2017   Pulmonary exam normal breath sounds clear to auscultation       Cardiovascular hypertension, Pt. on medications + CAD, + Past MI, + Cardiac Stents (2009, 2021), + CABG (2017) and + Peripheral Vascular Disease (plavix)  Normal cardiovascular exam+ Cardiac Defibrillator  Rhythm:Regular Rate:Normal  TTE 2022: 1. Left ventricular ejection fraction, by estimation, is 50 to 55%. The  left ventricle has low normal function. The LV endocardium is incompletely  visualized and it is difficult to assess wall motion. Based on available  views, the mid-to-apical inferior  and apical inferoseptal walls are mildly hypokinetic. There is mild  concentric left ventricular hypertrophy. Left ventricular diastolic  parameters are consistent with Grade I diastolic dysfunction (impaired  relaxation).  2. Right ventricular systolic function is normal. The right ventricular  size is normal. There is normal pulmonary artery systolic pressure.  3. The mitral valve is normal in structure. Trivial mitral valve  regurgitation. No evidence of mitral stenosis.  4. The aortic valve is tricuspid. There is mild-to-moderate calcification  of the aortic valve. There is mild thickening of the aortic valve. Aortic  valve regurgitation is not visualized. Aortic valve  sclerosis/calcification is present, without any  evidence of aortic stenosis.  5. There is a small, highly echogenic, well circumscribed mass measuring  0.6x1.0cm on the [place] most consistent with asymmetric calcification. This  was also present on prior  study and unlikely to represent a vegetation.  6. No valvular vegetations visualized. Consider TEE to exclude infective  endocarditis if clinically indicated.     Cath conclusions: 1. Severe native coronary artery disease, as detailed below, not significantly changed from prior catheterization in 12/2020. 2. Widely patent LIMA-LAD graft and LMCA stent. 3. Patent mid RCA stents with moderate restenosis (~40%). 4. Moderately reduced left ventricular systolic function (LVEF 00-37%). 5. Upper normal left ventricular filling pressure (LVEDP 15 mmHg).    Neuro/Psych PSYCHIATRIC DISORDERS Anxiety negative neurological ROS     GI/Hepatic hiatal hernia, GERD  Medicated and Controlled,(+)       alcohol use,   Endo/Other  diabetes, Poorly Controlled, Type 2, Oral Hypoglycemic AgentsObesity BMI 37 a1c 10.7 FS 184 at 11am  Renal/GU negative Renal ROS  negative genitourinary   Musculoskeletal negative musculoskeletal ROS (+)   Abdominal   Peds  Hematology  (+) Blood dyscrasia, anemia , 8.1/25.5   Anesthesia Other Findings Bacteremia   Took plavix and ASA today   Reproductive/Obstetrics negative OB ROS                            Anesthesia Physical Anesthesia Plan  ASA: 3  Anesthesia Plan: MAC   Post-op Pain Management:    Induction:   PONV Risk Score and Plan: 2 and Propofol infusion and TIVA  Airway Management Planned: Natural Airway and Simple Face Mask  Additional Equipment: None  Intra-op Plan:   Post-operative Plan:   Informed Consent: I have reviewed the patients History and Physical, chart, labs and discussed the procedure including the risks, benefits and alternatives for the proposed anesthesia with the patient or authorized representative  who has indicated his/her understanding and acceptance.     Dental advisory given  Plan Discussed with: CRNA  Anesthesia Plan Comments: (Hx esophageal stricture s/p dilation- dr. Harrington Challenger  aware)      Anesthesia Quick Evaluation

## 2021-11-27 NOTE — CV Procedure (Signed)
TEE  INdication:  Bacteremia  Patient sedated by anesthesia with Propofol intravenously   Throat numbed with viscous lidocaine Bite guard placed.  TEE probe advanced to mid esophagus without difficulty. (Uses assist of camera device)  LA, LAA without masses  Pacer wire in RA, RV   No masses  PFO is present as tested with injection of agitated saline with appearance in L sided chambers  AV is mildly thickened, calcified  No masses  No AI MV is normal   Trace MR TV is normal   Mild TR  PV is normal  LVEF and RVEF are normal  Full report to follow . Procedure was without complications  Dorris Carnes MD

## 2021-11-27 NOTE — Progress Notes (Addendum)
PROGRESS NOTE    Jasmine Richardson  NFA:[id number] DOB: [birth year] DOA: 11/21/2021 PCP: Nicholos Johns, MD  Brief Narrative: 60/F with history of CAD, COPD, chronic systolic/diastolic CHF, type 2 diabetes mellitus, anxiety, depression, history of V. tach, ICD presented to the ED with worsening left foot pain. -Recent left great toe amputation in October, followed by admission for I&D. -In the emergency room she was febrile to 100.9, labs noted WBC of 18, foot x-ray noted fracture base of left second phalanx with displacement -Started on linezolid and Zosyn -ID and podiatry following, 12/8-transmetatarsal amputation -Infectious disease consulting, plan for TEE on 12/12  Assessment & Plan:   Sepsis, POA Left diabetic foot infection, recent toe amputation and debridement Osteomyelitis of toes, metatarsals MRSA bacteremia -Podiatry following, MRI foot noted findings suspicious for osteomyelitis involving first metatarsal, second and third metatarsal heads and the second and third proximal phalanges, myositis, no abscess -Underwent transmetatarsal amputation 12/8 Dr. Blenda Mounts -Continue vancomycin, cefepime discontinued today -Infectious disease consulting, repeat blood cultures 12/9 are negative  -2D echo noted preserved EF, no vegetations noted, small, highly echogenic, well circumscribed mass measuring  0.6x1.0cm on the [place] most consistent with asymmetric calcification. This  was also present on prior study and unlikely to represent a vegetation -TEE negative for endocarditis -Intraoperative cultures with rare Enterococcus faecalis  PAD -Recently underwent aortogram, left anterior tibial artery angioplasty by Dr. Carlis Abbott on 10/26 -Continue aspirin Plavix statin  Postop blood loss anemia -Anemia panel with severe iron deficiency,  -Continue IV iron, hemoglobin 8.1 today  Type 2 diabetes mellitus -Continue Semglee, NovoLog with meals, dose increased -Hemoglobin A1c is 10.7  Asymptomatic  pyuria -Urinalysis abnormal with positive nitrite, small leukocytes, > 50 WBCs, patient denies any symptoms whatsoever, on antibiotics for above  Chronic systolic and diastolic CHF CAD Hypertension -Clinically euvolemic at this time, continue home regimen of aspirin, Plavix,  metoprolol, Lasix -holding valsartan, imdur -Continue p.o. Lasix -Echo with EF of 50-55%  History of V. Tach s/p AICD -Continue amiodarone  History of gangrenous skin/subcutaneous tissue of right breast -s/p excisional debridement by Dr. Leonidas Romberg 11/7 -Now with large scab, no surrounding  tenderness erythema or warmth  DVT prophylaxis: On DAPT with recent surgery, holding anticoagulation Code Status: Full code Family Communication: Discussed with patient in detail, no family at bedside Disposition Plan:  Status is:  inpatient because: Severity of illness  Consultants:  Podiatry, infectious disease  Procedures: Transmetatarsal amputation Dr. Blenda Mounts 12/8  TEE: Negative for endocarditis Dr. Harrington Challenger 12/12  Antimicrobials:    Subjective: -Feels okay, no events overnight, n.p.o. for TEE today  Objective: Vitals:   11/27/21 0402 11/27/21 1104 11/27/21 1244 11/27/21 1345  BP: 130/84 137/73 (!) 159/77 99/75  Pulse: 89 83 79 82  Resp: 16 17 20  (!) 23  Temp: 98.6 F (37 C) 98.4 F (36.9 C) (!) 97.2 F (36.2 C) 97.8 F (36.6 C)  TempSrc: Oral Oral Temporal Temporal  SpO2: 97% 98% 98% 98%  Weight: 105.9 kg  105.9 kg   Height:   5\' 7"  (1.702 m)     Intake/Output Summary (Last 24 hours) at 11/27/2021 1351 Last data filed at 11/27/2021 1339 Gross per 24 hour  Intake 540 ml  Output 1050 ml  Net -510 ml   Filed Weights   11/26/21 0613 11/27/21 0402 11/27/21 1244  Weight: 107.1 kg 105.9 kg 105.9 kg    Examination:  Obese pleasant female sitting up in bed, AAOx3, no distress  HEENT: No JVD CVS: S1-S2, regular rate rhythm  Lungs: Clear bilaterally Abdomen: Soft, nontender, nondistended, bowel  sounds present  Extremities:  Left foot with transmetatarsal amputation, dressing  Skin: No rash on exposed skin  psychiatry:  Mood & affect appropriate.   Data Reviewed:   CBC: Recent Labs  Lab 11/21/21 1828 11/23/21 0439 11/24/21 0230 11/26/21 0230 11/27/21 0451  WBC 18.1* 17.2* 12.9* 10.9* 12.2*  NEUTROABS 15.3*  --   --   --   --   HGB 10.7* 10.0* 8.5* 7.6* 8.1*  HCT 34.8* 33.0* 27.4* 23.6* 25.5*  MCV 86.1 86.2 85.9 83.4 83.6  PLT 475* 446* 379 386 034*   Basic Metabolic Panel: Recent Labs  Lab 11/21/21 1828 11/23/21 0439 11/24/21 0230 11/26/21 0230 11/27/21 0451  NA 133* 134* 132* 134* 134*  K 4.5 4.1 4.4 3.7 3.8  CL 98 100 99 102 102  CO2 25 24 25 23 24   GLUCOSE 272* 163* 209* 240* 215*  BUN 16 12 17 10 8   CREATININE 0.96 1.04* 1.17* 0.87 0.79  CALCIUM 9.7 9.4 9.0 9.0 8.8*   GFR: Estimated Creatinine Clearance: 92.4 mL/min (by C-G formula based on SCr of 0.79 mg/dL). Liver Function Tests: Recent Labs  Lab 11/21/21 1828  AST 21  ALT 19  ALKPHOS 54  BILITOT 0.7  PROT 8.2*  ALBUMIN 3.1*   Recent Labs  Lab 11/21/21 1828  LIPASE 25   No results for input(s): AMMONIA in the last 168 hours. Coagulation Profile: No results for input(s): INR, PROTIME in the last 168 hours. Cardiac Enzymes: Recent Labs  Lab 11/23/21 0439  CKTOTAL 16*   BNP (last 3 results) Recent Labs    01/12/21 1013  PROBNP 246   HbA1C: No results for input(s): HGBA1C in the last 72 hours.  CBG: Recent Labs  Lab 11/26/21 1151 11/26/21 1659 11/26/21 2110 11/27/21 0606 11/27/21 1100  GLUCAP 216* 254* 237* 215* 184*   Lipid Profile: No results for input(s): CHOL, HDL, LDLCALC, TRIG, CHOLHDL, LDLDIRECT in the last 72 hours. Thyroid Function Tests: No results for input(s): TSH, T4TOTAL, FREET4, T3FREE, THYROIDAB in the last 72 hours. Anemia Panel: Recent Labs    11/26/21 0230  VITAMINB12 3,031*  FOLATE 11.9  FERRITIN 121  TIBC 255  IRON 15*  RETICCTPCT 1.3    Urine analysis:    Component Value Date/Time   COLORURINE YELLOW 11/23/2021 0439   APPEARANCEUR CLEAR 11/23/2021 0439   LABSPEC 1.025 11/23/2021 0439   PHURINE 6.0 11/23/2021 0439   GLUCOSEU >=500 (A) 11/23/2021 0439   HGBUR TRACE (A) 11/23/2021 0439   BILIRUBINUR NEGATIVE 11/23/2021 0439   KETONESUR NEGATIVE 11/23/2021 0439   PROTEINUR >300 (A) 11/23/2021 0439   NITRITE POSITIVE (A) 11/23/2021 0439   LEUKOCYTESUR SMALL (A) 11/23/2021 0439   Sepsis Labs: @LABRCNTIP (procalcitonin:4,lacticidven:4)  ) Recent Results (from the past 240 hour(s))  Resp Panel by RT-PCR (Flu A&B, Covid) Nasopharyngeal Swab     Status: None   Collection Time: 11/21/21  6:18 PM   Specimen: Nasopharyngeal Swab; Nasopharyngeal(NP) swabs in vial transport medium  Result Value Ref Range Status   SARS Coronavirus 2 by RT PCR NEGATIVE NEGATIVE Final    Comment: (NOTE) SARS-CoV-2 target nucleic acids are NOT DETECTED.  The SARS-CoV-2 RNA is generally detectable in upper respiratory specimens during the acute phase of infection. The lowest concentration of SARS-CoV-2 viral copies this assay can detect is 138 copies/mL. A negative result does not preclude SARS-Cov-2 infection and should not be used as the sole basis for treatment or other patient management  decisions. A negative result may occur with  improper specimen collection/handling, submission of specimen other than nasopharyngeal swab, presence of viral mutation(s) within the areas targeted by this assay, and inadequate number of viral copies(<138 copies/mL). A negative result must be combined with clinical observations, patient history, and epidemiological information. The expected result is Negative.  Fact Sheet for Patients:  EntrepreneurPulse.com.au  Fact Sheet for Healthcare Providers:  IncredibleEmployment.be  This test is no t yet approved or cleared by the [place] FDA and  has been authorized  for detection and/or diagnosis of SARS-CoV-2 by FDA under an Emergency Use Authorization (EUA). This EUA will remain  in effect (meaning this test can be used) for the duration of the COVID-19 declaration under Section 564(b)(1) of the Act, 21 U.S.C.section 360bbb-3(b)(1), unless the authorization is terminated  or revoked sooner.       Influenza A by PCR NEGATIVE NEGATIVE Final   Influenza B by PCR NEGATIVE NEGATIVE Final    Comment: (NOTE) The Xpert Xpress SARS-CoV-2/FLU/RSV plus assay is intended as an aid in the diagnosis of influenza from Nasopharyngeal swab specimens and should not be used as a sole basis for treatment. Nasal washings and aspirates are unacceptable for Xpert Xpress SARS-CoV-2/FLU/RSV testing.  Fact Sheet for Patients: EntrepreneurPulse.com.au  Fact Sheet for Healthcare Providers: IncredibleEmployment.be  This test is not yet approved or cleared by the [place] FDA and has been authorized for detection and/or diagnosis of SARS-CoV-2 by FDA under an Emergency Use Authorization (EUA). This EUA will remain in effect (meaning this test can be used) for the duration of the COVID-19 declaration under Section 564(b)(1) of the Act, 21 U.S.C. section 360bbb-3(b)(1), unless the authorization is terminated or revoked.  Performed at [facility], [place] [address], [place], [place] [address]   Blood culture (routine x 2)     Status: Abnormal   Collection Time: 11/21/21  6:28 PM   Specimen: Site Not Specified; Blood  Result Value Ref Range Status   Specimen Description SITE NOT SPECIFIED  Final   Special Requests   Final    BOTTLES DRAWN AEROBIC AND ANAEROBIC Blood Culture results may not be optimal due to an excessive volume of blood received in culture bottles   Culture  Setup Time   Final    GRAM POSITIVE COCCI IN CLUSTERS IN BOTH AEROBIC AND ANAEROBIC BOTTLES CRITICAL RESULT CALLED TO, READ BACK BY AND VERIFIED WITH:  Keith Rake PHARMD 1340 11/22/21 A BROWNING Performed at [facility], [place] [address] [place], [place] [address]    Culture METHICILLIN RESISTANT STAPHYLOCOCCUS AUREUS (A)  Final   Report Status 11/24/2021 FINAL  Final   Organism ID, Bacteria METHICILLIN RESISTANT STAPHYLOCOCCUS AUREUS  Final      Susceptibility   Methicillin resistant staphylococcus aureus - MIC*    CIPROFLOXACIN >=8 RESISTANT Resistant     ERYTHROMYCIN >=8 RESISTANT Resistant     GENTAMICIN <=0.5 SENSITIVE Sensitive     OXACILLIN >=4 RESISTANT Resistant     TETRACYCLINE 2 SENSITIVE Sensitive     VANCOMYCIN <=0.5 SENSITIVE Sensitive     TRIMETH/SULFA <=10 SENSITIVE Sensitive     CLINDAMYCIN >=8 RESISTANT Resistant     RIFAMPIN <=0.5 SENSITIVE Sensitive     Inducible Clindamycin NEGATIVE Sensitive     * METHICILLIN RESISTANT STAPHYLOCOCCUS AUREUS  Blood Culture ID Panel (Reflexed)     Status: Abnormal   Collection Time: 11/21/21  6:28 PM  Result Value Ref Range Status   Enterococcus faecalis NOT  DETECTED NOT DETECTED Final   Enterococcus Faecium NOT DETECTED NOT DETECTED Final   Listeria monocytogenes NOT DETECTED NOT DETECTED Final   Staphylococcus species DETECTED (A) NOT DETECTED Final    Comment: CRITICAL RESULT CALLED TO, READ BACK BY AND VERIFIED WITH: K PATEL PHARMD 1340 11/22/21 A BROWNING    Staphylococcus aureus (BCID) DETECTED (A) NOT DETECTED Final    Comment: Methicillin (oxacillin)-resistant Staphylococcus aureus (MRSA). MRSA is predictably resistant to beta-lactam antibiotics (except ceftaroline). Preferred therapy is vancomycin unless clinically contraindicated. Patient requires contact precautions if  hospitalized. CRITICAL RESULT CALLED TO, READ BACK BY AND VERIFIED WITH: K PATEL PHARMD 1340 11/22/21 A BROWNING    Staphylococcus epidermidis NOT DETECTED NOT DETECTED Final   Staphylococcus lugdunensis NOT DETECTED NOT DETECTED Final   Streptococcus species NOT DETECTED NOT DETECTED Final    Streptococcus agalactiae NOT DETECTED NOT DETECTED Final   Streptococcus pneumoniae NOT DETECTED NOT DETECTED Final   Streptococcus pyogenes NOT DETECTED NOT DETECTED Final   A.calcoaceticus-baumannii NOT DETECTED NOT DETECTED Final   Bacteroides fragilis NOT DETECTED NOT DETECTED Final   Enterobacterales NOT DETECTED NOT DETECTED Final   Enterobacter cloacae complex NOT DETECTED NOT DETECTED Final   Escherichia coli NOT DETECTED NOT DETECTED Final   Klebsiella aerogenes NOT DETECTED NOT DETECTED Final   Klebsiella oxytoca NOT DETECTED NOT DETECTED Final   Klebsiella pneumoniae NOT DETECTED NOT DETECTED Final   Proteus species NOT DETECTED NOT DETECTED Final   Salmonella species NOT DETECTED NOT DETECTED Final   Serratia marcescens NOT DETECTED NOT DETECTED Final   Haemophilus influenzae NOT DETECTED NOT DETECTED Final   Neisseria meningitidis NOT DETECTED NOT DETECTED Final   Pseudomonas aeruginosa NOT DETECTED NOT DETECTED Final   Stenotrophomonas maltophilia NOT DETECTED NOT DETECTED Final   Candida albicans NOT DETECTED NOT DETECTED Final   Candida auris NOT DETECTED NOT DETECTED Final   Candida glabrata NOT DETECTED NOT DETECTED Final   Candida krusei NOT DETECTED NOT DETECTED Final   Candida parapsilosis NOT DETECTED NOT DETECTED Final   Candida tropicalis NOT DETECTED NOT DETECTED Final   Cryptococcus neoformans/gattii NOT DETECTED NOT DETECTED Final   Meth resistant mecA/C and MREJ DETECTED (A) NOT DETECTED Final    Comment: CRITICAL RESULT CALLED TO, READ BACK BY AND VERIFIED WITH: K PATEL PHARMD 1340 11/22/21 A BROWNING Performed at [facility], [address] [place], [place] [address]   Surgical pcr screen     Status: None   Collection Time: 11/23/21 12:37 AM   Specimen: Nasal Mucosa; Nasal Swab  Result Value Ref Range Status   MRSA, PCR NEGATIVE NEGATIVE Final   Staphylococcus aureus NEGATIVE NEGATIVE Final    Comment: (NOTE) The Xpert SA Assay (FDA approved  for NASAL specimens in patients 47 years of age and older), is one component of a comprehensive surveillance program. It is not intended to diagnose infection nor to guide or monitor treatment. Performed at [facility], [place] [address] [place], [place] [address]   Aerobic/Anaerobic Culture w Gram Stain (surgical/deep wound)     Status: None (Preliminary result)   Collection Time: 11/23/21 12:13 PM   Specimen: PATH Other; Body Fluid  Result Value Ref Range Status   Specimen Description FLUID  Final   Special Requests LEFT FOOT WOUND SPEC A  Final   Gram Stain NO RBC SEEN NO ORGANISMS SEEN   Final   Culture   Final    NO GROWTH 4 DAYS NO ANAEROBES ISOLATED; CULTURE IN PROGRESS  FOR 5 DAYS Performed at [facility], [place] [address], [place], [place] [address]    Report Status PENDING  Incomplete  Aerobic/Anaerobic Culture w Gram Stain (surgical/deep wound)     Status: None (Preliminary result)   Collection Time: 11/23/21  1:34 PM   Specimen: PATH Other; Body Fluid  Result Value Ref Range Status   Specimen Description TISSUE  Final   Special Requests LEFT FIRST METATARSAL SPEC B  Final   Gram Stain   Final    NO WBC SEEN NO ORGANISMS SEEN Performed at [facility], [address], [place], [place] [address]    Culture   Final    RARE ENTEROCOCCUS FAECALIS NO ANAEROBES ISOLATED; CULTURE IN PROGRESS FOR 5 DAYS    Report Status PENDING  Incomplete   Organism ID, Bacteria ENTEROCOCCUS FAECALIS  Final      Susceptibility   Enterococcus faecalis - MIC*    AMPICILLIN <=2 SENSITIVE Sensitive     VANCOMYCIN 1 SENSITIVE Sensitive     GENTAMICIN SYNERGY SENSITIVE Sensitive     * RARE ENTEROCOCCUS FAECALIS  Culture, blood (routine x 2)     Status: None (Preliminary result)   Collection Time: 11/24/21 10:37 AM   Specimen: BLOOD  Result Value Ref Range Status   Specimen Description BLOOD RIGHT ANTECUBITAL  Final   Special Requests   Final    BOTTLES DRAWN AEROBIC ONLY  Blood Culture adequate volume   Culture   Final    NO GROWTH 3 DAYS Performed at [facility], [place] [address] [place], [place] [address]    Report Status PENDING  Incomplete  Culture, blood (routine x 2)     Status: None (Preliminary result)   Collection Time: 11/24/21 10:53 AM   Specimen: BLOOD LEFT HAND  Result Value Ref Range Status   Specimen Description BLOOD LEFT HAND  Final   Special Requests   Final    BOTTLES DRAWN AEROBIC ONLY Blood Culture results may not be optimal due to an inadequate volume of blood received in culture bottles   Culture   Final    NO GROWTH 3 DAYS Performed at [facility], [place] [address], [place], [place] [address]    Report Status PENDING  Incomplete    Radiology Studies: No results found.  Scheduled Meds:  [MAR Hold] amiodarone  200 mg Oral Daily   [MAR Hold] aspirin EC  81 mg Oral Daily   [MAR Hold] clopidogrel  75 mg Oral Daily   [MAR Hold] furosemide  40 mg Oral Daily   [MAR Hold] hydrocerin   Topical BID   [MAR Hold] insulin aspart  0-15 Units Subcutaneous TID WC   [MAR Hold] insulin aspart  0-5 Units Subcutaneous QHS   [MAR Hold] insulin aspart  6 Units Subcutaneous TID WC   [MAR Hold] insulin glargine-yfgn  45 Units Subcutaneous Daily   [MAR Hold] pantoprazole  40 mg Oral Daily   [MAR Hold] polyethylene glycol  17 g Oral Daily   [MAR Hold] rosuvastatin  20 mg Oral QHS   [MAR Hold] senna-docusate  1 tablet Oral BID   [MAR Hold] sodium chloride flush  3 mL Intravenous Q12H   Continuous Infusions:  [MAR Hold] sodium chloride Stopped (11/24/21 2120)   sodium chloride 20 mL/hr at 11/27/21 1256   [MAR Hold] DAPTOmycin (CUBICIN)  IV 650 mg (11/26/21 2109)   [MAR Hold] ferric gluconate (FERRLECIT) IVPB 250 mg (11/27/21 0923)     LOS:  5 days    Time spent:  42min  Domenic Polite, MD [facility]   11/27/2021, 1:51 PM

## 2021-11-27 NOTE — Interval H&P Note (Signed)
History and Physical Interval Note:  11/27/2021 10:56 AM  Jasmine Richardson  has presented today for surgery, with the diagnosis of BACTEREMIA.  The various methods of treatment have been discussed with the patient and family. After consideration of risks, benefits and other options for treatment, the patient has consented to  Procedure(s): TRANSESOPHAGEAL ECHOCARDIOGRAM (TEE) (N/A) as a surgical intervention.  The patient's history has been reviewed, patient examined, no change in status, stable for surgery.  I have reviewed the patient's chart and labs.  Questions were answered to the patient's satisfaction.     Dorris Carnes

## 2021-11-27 NOTE — Progress Notes (Signed)
Brief Podiatry Note:  Patient is s/p Left TMA 11/23/21.   Patient was off the floor for TEE.   Podiatry will attempt to round on patient on tomorrow AM.   Continue with post op care Keep dressing clean, dry, and intact to left foot; podiatry to change dressings Continue with recs by ID for antibiotic management based on intra-op cultures May weightbear for bedside and bathroom with post op shoe to heel on left and can use rolling walker/cane for stability  Continue with rest and elevation for pain and edema control Podiatry to follow while inpatient and as scheduled in a week after discharge.   Dr. Landis Martins On Call provider  [facility] [phone] office [phone] cell Available via secure chat

## 2021-11-27 NOTE — Progress Notes (Signed)
[facility]  Date of Admission:  11/21/2021   Total days of inpatient antibiotics 4  Principal Problem:   Diabetic foot infection ([place]) Active Problems:   Chronic diastolic heart failure (HCC)   Coronary artery disease involving native coronary artery of native heart with angina pectoris (HCC)   Essential hypertension   GERD (gastroesophageal reflux disease)   S/P CABG (coronary artery bypass graft)   Ventricular tachycardia   ICD (implantable cardioverter-defibrillator) in place   Hyperglycemia due to type 2 diabetes mellitus ([place])   Bacteremia      Assessment:    60 year old female with uncontrolled diabetes with A1c 12.8, diabetic left foot wounds status post I&D on 10/10/21 followed by podiatry, V. tach status post ICD implantation admitted for sepsis secondary to left foot wound.  ID is following due to MRSA bacteremia.   # MRSA Bacteremia 2/2 diabetic left foot wound SP left transmetatarsal amputation with Cx + Enterococcus faecalis #ICD #Uncontrolled diabetes #Vancomycin allergy #Breast cellulitis SP I&D on 10/23/21 Procedures prior to hospitalization: -SP 1st metatarsal revision on 05/02/21 with residual OR Cx+ staph aureus, treated with bactrim x 2 weeks. Suspect pt had residual OM and did not receive antibiotics past 2 weeks.  -I&D Cx of left foot wound from 10/10/21 are form deep wound, does not appear they are from bone. Treated with 2 weeks of antibiotics(augment/fluconazole x7 days). - MRI of left foot on 11/22/21 showed suspicion for osteomyelitis of 1st/2nd/3rd metatarsal with diffuse cellulitis and myofascitis. Pt went to OR on 11/23/21 for left transmetatarsal amputation. -Pt reports right breast erythema started at the same time as her foot worsened.  There is a note by Dr. Noberto Retort on 10/23/21([facility] in [place])where right breast wound was debrided (gangrenous skin/subcutaneous tissue). [place]  of breast on 12/7 no sign of  abscess. -TTE showed 0.6x1 cm mass on LCC most consistent with asymmetric calcification, seen on prior study. Would obtain TEE in  the setting of MRSA bacteremia Recommendations: -D/C cefepime -Continue  daptomycin  -Follow-up TEE -Follow blood Cx form 12/7 to ensure clearance -Follow OR Cx Microbiology:   Antibiotics: Linezolid 12/06-12/7 Pip-tazo 12/06 Cefepime 12/8-p Daptomycin 12/07-p  Cultures: Blood 12/06 + MRSA   12/8 OR Cx E. faecalis gram stain: no organisms SUBJECTIVE: Pt is resting in bed. She is not complaining of pain today. We briefly dicussed the likleyhood that when she lives, she will need a PICC line.   Interval: afebrile over the weekend.  Review of Systems: Review of Systems  All other systems reviewed and are negative.   Scheduled Meds:  amiodarone  200 mg Oral Daily   aspirin EC  81 mg Oral Daily   clopidogrel  75 mg Oral Daily   furosemide  40 mg Oral Daily   hydrocerin   Topical BID   insulin aspart  0-15 Units Subcutaneous TID WC   insulin aspart  0-5 Units Subcutaneous QHS   insulin aspart  6 Units Subcutaneous TID WC   insulin glargine-yfgn  45 Units Subcutaneous Daily   pantoprazole  40 mg Oral Daily   polyethylene glycol  17 g Oral Daily   rosuvastatin  20 mg Oral QHS   senna-docusate  1 tablet Oral BID   sodium chloride flush  3 mL Intravenous Q12H   Continuous Infusions:  sodium chloride Stopped (11/24/21 2120)   sodium chloride 20 mL/hr at 11/26/21 2109   DAPTOmycin (CUBICIN)  IV 650 mg (11/26/21 2109)  ferric gluconate (FERRLECIT) IVPB 250 mg (11/27/21 0923)   PRN Meds:.sodium chloride, acetaminophen **OR** acetaminophen, calcium carbonate, HYDROmorphone (DILAUDID) injection, polyethylene glycol Allergies  Allergen Reactions   Chlorhexidine Gluconate Itching    Received CHG bath, began itching, required benadryl   Cat Hair Extract Other (See Comments)    Sneezing, watery eyes.   Tramadol Other (See Comments)     Hallucinations   Vancomycin     Other reaction(s): Anaphylaxis*   Codeine Rash    OBJECTIVE: Vitals:   11/26/21 1156 11/26/21 2010 11/26/21 2114 11/27/21 0402  BP: 127/83  127/70 130/84  Pulse: 78  87 89  Resp: 18  17 16   Temp: 98.1 F (36.7 C)  99.2 F (37.3 C) 98.6 F (37 C)  TempSrc: Oral  Oral Oral  SpO2: 97% 97% 100% 97%  Weight:    105.9 kg  Height:       Body mass index is 36.57 kg/m.  Physical Exam Constitutional:      Appearance: Normal appearance.  HENT:     Head: Normocephalic and atraumatic.     Right Ear: Tympanic membrane normal.     Left Ear: Tympanic membrane normal.     Nose: Nose normal.     Mouth/Throat:     Mouth: Mucous membranes are moist.  Eyes:     Extraocular Movements: Extraocular movements intact.     Conjunctiva/sclera: Conjunctivae normal.     Pupils: Pupils are equal, round, and reactive to light.  Cardiovascular:     Rate and Rhythm: Normal rate and regular rhythm.     Heart sounds: No murmur heard.   No friction rub. No gallop.  Pulmonary:     Effort: Pulmonary effort is normal.     Breath sounds: Normal breath sounds.  Abdominal:     General: Abdomen is flat.     Palpations: Abdomen is soft.  Musculoskeletal:        General: Normal range of motion.     Comments: Left foot bandaged.  Skin:    General: Skin is warm and dry.     Comments: Right breast wound  Neurological:     General: No focal deficit present.     Mental Status: She is alert and oriented to person, place, and time.  Psychiatric:        Mood and Affect: Mood normal.      Lab Results Lab Results  Component Value Date   WBC 12.2 (H) 11/27/2021   HGB 8.1 (L) 11/27/2021   HCT 25.5 (L) 11/27/2021   MCV 83.6 11/27/2021   PLT 487 (H) 11/27/2021    Lab Results  Component Value Date   CREATININE 0.79 11/27/2021   BUN 8 11/27/2021   NA 134 (L) 11/27/2021   K 3.8 11/27/2021   CL 102 11/27/2021   CO2 24 11/27/2021    Lab Results  Component Value Date    ALT 19 11/21/2021   AST 21 11/21/2021   ALKPHOS 54 11/21/2021   BILITOT 0.7 11/21/2021        Laurice Record, [facility] 11/27/2021, 10:23 AM

## 2021-11-27 NOTE — Progress Notes (Signed)
Physical Therapy Treatment Patient Details Name: Jasmine Richardson MRN: [id number] DOB: Jun 12, 1961 Today's Date: 11/27/2021   History of Present Illness Pt is a 60 y.o. female who presented 11/21/21 with worsening L foot pain. Of note, pt with L great toe amputation in October. MRI of L foot this admission revealed findings highly suspicious for osteomyelitis involving the first  metatarsal, second and third metatarsal heads and the second and  third proximal phalanges along with diffuse cellulitis and myofasciitis. S/p I&D of L foot and L transmetatarsal amputation 12/8. PMH: CAD, COPD, chronic systolic/diastolic CHF, type 2 diabetes mellitus, anxiety, depression, history of V. tach, ICD    PT Comments    Pt progressing with mobility. Gait more unsteady using Darco shoe. Pt has her prior post op shoe with flat bottom in room and I think this will provide her better stability with gait.    Recommendations for follow up therapy are one component of a multi-disciplinary discharge planning process, led by the attending physician.  Recommendations may be updated based on patient status, additional functional criteria and insurance authorization.  Follow Up Recommendations  Outpatient PT     Assistance Recommended at Discharge Intermittent Supervision/Assistance  Equipment Recommendations  None recommended by PT    Recommendations for Other Services       Precautions / Restrictions Precautions Precautions: Fall Required Braces or Orthoses: Other Brace Other Brace: post op shoe vs Darco shoe Restrictions Weight Bearing Restrictions: Yes LLE Weight Bearing: Partial weight bearing LLE Partial Weight Bearing Percentage or Pounds: Weight bear through heel     Mobility  Bed Mobility Overal bed mobility: Modified Independent                  Transfers Overall transfer level: Needs assistance Equipment used: Rolling walker (2 wheels) Transfers: Sit to/from Stand Sit to Stand:  Supervision           General transfer comment: Supervision for safety due to slight instability from using Darco shoe.    Ambulation/Gait Ambulation/Gait assistance: Supervision Gait Distance (Feet): 40 Feet Assistive device: Rolling walker (2 wheels) Gait Pattern/deviations: Step-to pattern;Decreased weight shift to left;Decreased step length - right;Decreased step length - left Gait velocity: decr Gait velocity interpretation: <1.31 ft/sec, indicative of household ambulator   General Gait Details: Assist for safety due to slight instability using Darco shoe.   Stairs             Wheelchair Mobility    Modified Rankin (Stroke Patients Only)       Balance Overall balance assessment: Needs assistance Sitting-balance support: No upper extremity supported;Feet supported Sitting balance-Leahy Scale: Good     Standing balance support: Bilateral upper extremity supported;Reliant on assistive device for balance Standing balance-Leahy Scale: Poor Standing balance comment: walker and supervision for static standing                            Cognition Arousal/Alertness: Awake/alert Behavior During Therapy: WFL for tasks assessed/performed Overall Cognitive Status: Within Functional Limits for tasks assessed                                          Exercises      General Comments        Pertinent Vitals/Pain Pain Assessment: No/denies pain    Home Living Family/patient expects to be discharged to:: Private residence  Living Arrangements: Spouse/significant other;Children                      Prior Function            PT Goals (current goals can now be found in the care plan section) Progress towards PT goals: Progressing toward goals    Frequency    Min 3X/week      PT Plan Current plan remains appropriate    Co-evaluation              AM-PAC PT "6 Clicks" Mobility   Outcome Measure  Help needed  turning from your back to your side while in a flat bed without using bedrails?: None Help needed moving from lying on your back to sitting on the side of a flat bed without using bedrails?: None Help needed moving to and from a bed to a chair (including a wheelchair)?: A Little Help needed standing up from a chair using your arms (e.g., wheelchair or bedside chair)?: A Little Help needed to walk in hospital room?: A Little Help needed climbing 3-5 steps with a railing? : A Little 6 Click Score: 20    End of Session   Activity Tolerance: Patient tolerated treatment well Patient left: in bed;with call bell/phone within reach;with bed alarm set   PT Visit Diagnosis: Unsteadiness on feet (R26.81);Other abnormalities of gait and mobility (R26.89);Difficulty in walking, not elsewhere classified (R26.2)     Time: 4128-2081 PT Time Calculation (min) (ACUTE ONLY): 13 min  Charges:  $Gait Training: 8-22 mins                     [place] Pager [phone] Office [place] 11/27/2021, 2:48 PM

## 2021-11-27 NOTE — TOC Progression Note (Addendum)
Transition of Care [facility]) - Progression Note    Patient Details  Name: Sultana Tierney MRN: [id number] Date of Birth: 1961-01-12  [facility]) CM/SW Contact  Zenon Mayo, RN Phone Number: 11/27/2021, 4:27 PM  Clinical Narrative:    Patient with diabetic foot, bacteremia, s/p amputation, jp drain is out. Up ambulating, she will need picc line when she goes home, for TEE today. , there is no vegetation but has PFO per STaff RN. ID following and Carolynn Sayers following for iv abx's.        Expected Discharge Plan and Services                                                 Social Determinants of Health (SDOH) Interventions    Readmission Risk Interventions No flowsheet data found.

## 2021-11-27 NOTE — Progress Notes (Signed)
Echocardiogram Echocardiogram Transesophageal has been performed.  Oneal Deputy Jimia Gentles RDCS 11/27/2021, 1:50 PM

## 2021-11-28 ENCOUNTER — Inpatient Hospital Stay: Payer: Self-pay

## 2021-11-28 LAB — AEROBIC/ANAEROBIC CULTURE W GRAM STAIN (SURGICAL/DEEP WOUND)
Culture: NO GROWTH
Gram Stain: NONE SEEN
Gram Stain: NONE SEEN

## 2021-11-28 LAB — BASIC METABOLIC PANEL
Anion gap: 10 (ref 5–15)
BUN: 8 mg/dL (ref 8–23)
CO2: 22 mmol/L (ref 22–32)
Calcium: 8.7 mg/dL — ABNORMAL LOW (ref 8.9–10.3)
Chloride: 102 mmol/L (ref 98–111)
Creatinine, Ser: 0.81 mg/dL (ref 0.44–1.00)
GFR, Estimated: >60 mL/min (ref >60)
Glucose, Bld: 169 mg/dL — ABNORMAL HIGH (ref 70–99)
Potassium: 3.3 mmol/L — ABNORMAL LOW (ref 3.5–5.1)
Sodium: 134 mmol/L — ABNORMAL LOW (ref 135–145)

## 2021-11-28 LAB — CBC
HCT: 25 % — ABNORMAL LOW (ref 36.0–46.0)
Hemoglobin: 8.3 g/dL — ABNORMAL LOW (ref 12.0–15.0)
MCH: 27.8 pg (ref 26.0–34.0)
MCHC: 33.2 g/dL (ref 30.0–36.0)
MCV: 83.6 fL (ref 80.0–100.0)
Platelets: 506 10*3/uL — ABNORMAL HIGH (ref 150–400)
RBC: 2.99 MIL/uL — ABNORMAL LOW (ref 3.87–5.11)
RDW: 14.2 % (ref 11.5–15.5)
WBC: 12.8 10*3/uL — ABNORMAL HIGH (ref 4.0–10.5)
nRBC: 0 % (ref 0.0–0.2)

## 2021-11-28 LAB — GLUCOSE, CAPILLARY
Glucose-Capillary: 155 mg/dL — ABNORMAL HIGH (ref 70–99)
Glucose-Capillary: 197 mg/dL — ABNORMAL HIGH (ref 70–99)
Glucose-Capillary: 248 mg/dL — ABNORMAL HIGH (ref 70–99)

## 2021-11-28 MED ORDER — HYDROMORPHONE HCL 2 MG PO TABS: 2.0000 mg | ORAL_TABLET | Freq: Four times a day (QID) | ORAL | 0 refills | Status: AC | PRN

## 2021-11-28 MED ORDER — DAPTOMYCIN IV (FOR PTA / DISCHARGE USE ONLY): 650.0000 mg | INTRAVENOUS | 0 refills | Status: DC

## 2021-11-28 MED ORDER — SODIUM CHLORIDE 0.9% FLUSH: 10.0000 mL | INTRAVENOUS | Status: DC | PRN

## 2021-11-28 MED ORDER — TRESIBA FLEXTOUCH 100 UNIT/ML ~~LOC~~ SOPN: 30.0000 [IU] | PEN_INJECTOR | Freq: Every day | SUBCUTANEOUS | Status: AC

## 2021-11-28 MED ORDER — SODIUM CHLORIDE 0.9% FLUSH: 10.0000 mL | Freq: Two times a day (BID) | INTRAVENOUS | Status: DC

## 2021-11-28 MED ORDER — SENNOSIDES-DOCUSATE SODIUM 8.6-50 MG PO TABS
1.0000 | ORAL_TABLET | Freq: Two times a day (BID) | ORAL | 0 refills | Status: DC
Start: 2021-11-28 — End: 2022-02-01

## 2021-11-28 MED ORDER — POTASSIUM CHLORIDE CRYS ER 20 MEQ PO TBCR
40.0000 meq | EXTENDED_RELEASE_TABLET | Freq: Once | ORAL | Status: AC
  Administered 2021-11-28: 40 meq via ORAL
  Filled 2021-11-28: qty 2

## 2021-11-28 MED ORDER — SODIUM CHLORIDE 0.9 % IV SOLN
8.0000 mg/kg | Freq: Once | INTRAVENOUS | Status: AC
  Administered 2021-11-28: 650 mg via INTRAVENOUS
  Filled 2021-11-28: qty 13

## 2021-11-28 NOTE — Progress Notes (Signed)
Subjective: Jasmine Richardson is a 60 y.o. female patient seen at bedside, resting comfortably in no acute distress s/p day #5 Left foot TMA. Patient denies pain at surgical site, but did have some pain on yesterday, denies any other constitutional symptoms. No other issues noted.   Patient Active Problem List   Diagnosis Date Noted   Bacteremia 11/22/2021   Diabetic foot infection ([place]) 11/21/2021   Abnormal stress test 10/23/2021   Abnormal EKG 10/23/2021   AKI (acute kidney injury) ([place]) 10/23/2021   Anxiety 10/23/2021   Cellulitis of right breast 10/23/2021   Gangrene ([place]) 10/23/2021   Hematoma 10/23/2021   Hiatal hernia 10/23/2021   Hyperglycemia 10/23/2021   Hyperglycemia due to type 2 diabetes mellitus ([place]) 10/23/2021   Hyponatremia 10/23/2021   Infection of great toe 10/23/2021   Lactic acidosis 10/23/2021   Leukocytosis 10/23/2021   Obesity (BMI 30-39.9) 10/23/2021   Perineal abscess 10/23/2021   Cellulitis, wound, post-operative 10/09/2021   Abnormal mammogram of left breast 09/04/2021   Breast wound, right, subsequent encounter 09/04/2021   Demand ischemia ([place])    ICD (implantable cardioverter-defibrillator) in place 05/16/2021   Ventricular tachycardia 12/17/2020   Unstable angina ([place]) 05/22/2020   NSTEMI (non-ST elevated myocardial infarction) ([place]) 05/22/2020   Diabetic foot ulcer ([place]) 02/16/2020   Angina pectoris ([place]) 09/11/2017   Emphysema lung ([place]) 09/11/2017   Chronic diastolic heart failure ([place]) 11/06/2016   Mediastinitis 06/28/2016   Wound, surgical, infected 06/06/2016   S/P CABG (coronary artery bypass graft) 06/03/2016   Severe sepsis ([place]) 06/03/2016   GERD (gastroesophageal reflux disease) 05/08/2016   Morbid obesity ([place]) 05/08/2016   Type 2 diabetes mellitus with foot ulcer ([place]) 05/08/2016   Sinus tachycardia 05/08/2016   Acute chest pain 05/08/2016   Burping 05/08/2016   Tobacco use disorder 04/20/2016   Coronary artery disease involving  native coronary artery of native heart with angina pectoris ([place]) 09/12/2015   Essential hypertension 09/12/2015   Hyperlipidemia 09/12/2015     Current Facility-Administered Medications:    0.9 %  sodium chloride infusion, , Intravenous, PRN, Fay Records, MD, Stopped at 11/24/21 2120   acetaminophen (TYLENOL) tablet 650 mg, 650 mg, Oral, Q6H PRN, 650 mg at 11/27/21 2146 **OR** acetaminophen (TYLENOL) suppository 650 mg, 650 mg, Rectal, Q6H PRN, Fay Records, MD   amiodarone (PACERONE) tablet 200 mg, 200 mg, Oral, Daily, Dorris Carnes V, MD, 200 mg at 11/27/21 0809   aspirin EC tablet 81 mg, 81 mg, Oral, Daily, Fay Records, MD, 81 mg at 11/27/21 0809   calcium carbonate (TUMS - dosed in mg elemental calcium) chewable tablet 200 mg of elemental calcium, 1 tablet, Oral, BID BM & HS PRN, Fay Records, MD, 200 mg of elemental calcium at 11/26/21 1813   clopidogrel (PLAVIX) tablet 75 mg, 75 mg, Oral, Daily, Dorris Carnes V, MD, 75 mg at 11/27/21 0810   DAPTOmycin (CUBICIN) 650 mg in sodium chloride 0.9 % IVPB, 8 mg/kg (Adjusted), Intravenous, Q2000, Fay Records, MD, Last Rate: 126 mL/hr at 11/27/21 2122, 650 mg at 11/27/21 2122   ferric gluconate (FERRLECIT) 250 mg in sodium chloride 0.9 % 250 mL IVPB, 250 mg, Intravenous, Daily, Fay Records, MD, Stopped at 11/27/21 1137   furosemide (LASIX) tablet 40 mg, 40 mg, Oral, Daily, Fay Records, MD, 40 mg at 11/27/21 9470   hydrocerin (EUCERIN) cream, , Topical, BID, Fay Records, MD, Given at 11/27/21 2136   HYDROmorphone (DILAUDID) injection 0.5 mg, 0.5 mg,  Intravenous, Q4H PRN, Dorris Carnes V, MD, 0.5 mg at 11/27/21 2341   insulin aspart (novoLOG) injection 0-15 Units, 0-15 Units, Subcutaneous, TID WC, Fay Records, MD, 3 Units at 11/28/21 4098   insulin aspart (novoLOG) injection 0-5 Units, 0-5 Units, Subcutaneous, QHS, Fay Records, MD, 2 Units at 11/27/21 2135   insulin aspart (novoLOG) injection 6 Units, 6 Units, Subcutaneous, TID WC, Fay Records, MD, 6 Units at 11/27/21 1643   insulin glargine-yfgn (SEMGLEE) injection 45 Units, 45 Units, Subcutaneous, Daily, Fay Records, MD, 45 Units at 11/27/21 0810   pantoprazole (PROTONIX) EC tablet 40 mg, 40 mg, Oral, Daily, Dorris Carnes V, MD, 40 mg at 11/27/21 0810   polyethylene glycol (MIRALAX / GLYCOLAX) packet 17 g, 17 g, Oral, Daily PRN, Fay Records, MD   polyethylene glycol (MIRALAX / GLYCOLAX) packet 17 g, 17 g, Oral, Daily, Dorris Carnes V, MD, 17 g at 11/26/21 0843   rosuvastatin (CRESTOR) tablet 20 mg, 20 mg, Oral, QHS, Fay Records, MD, 20 mg at 11/27/21 2133   senna-docusate (Senokot-S) tablet 1 tablet, 1 tablet, Oral, BID, Fay Records, MD, 1 tablet at 11/27/21 0809   sodium chloride flush (NS) 0.9 % injection 3 mL, 3 mL, Intravenous, Q12H, Fay Records, MD, 3 mL at 11/27/21 2135  Allergies  Allergen Reactions   Chlorhexidine Gluconate Itching    Received CHG bath, began itching, required benadryl   Cat Hair Extract Other (See Comments)    Sneezing, watery eyes.   Tramadol Other (See Comments)    Hallucinations   Vancomycin     Other reaction(s): Anaphylaxis*   Codeine Rash     Objective: Today's Vitals   11/27/21 2231 11/28/21 0011 11/28/21 0327 11/28/21 0606  BP:    121/79  Pulse:    80  Resp:    18  Temp:    98.2 F (36.8 C)  TempSrc:    Oral  SpO2:    100%  Weight:    104.5 kg  Height:      PainSc: Asleep Asleep 0-No pain     General: No acute distress  Left Lower extremity: Dressing to left foot clean, dry, intact. No strikethrough noted, Upon removal of dressings sutures and staples intact with no dehiscence.No erythema, decreased edema, mild bloody drainage. No other acute signs of infection. No calf pain. Range of motion excluding surgical site within normal limits with no pain or crepitation.      Assessment and Plan:  Problem List Items Addressed This Visit       Endocrine   * (Principal) Diabetic foot infection ([place])   Relevant  Medications   aspirin EC tablet 81 mg   rosuvastatin (CRESTOR) tablet 20 mg   insulin aspart (novoLOG) injection 0-5 Units   DAPTOmycin (CUBICIN) 650 mg in sodium chloride 0.9 % IVPB   dapagliflozin propanediol (FARXIGA) 10 MG TABS tablet   insulin aspart (novoLOG) injection 0-15 Units   insulin aspart (novoLOG) injection 6 Units   insulin glargine-yfgn (SEMGLEE) injection 45 Units   Other Visit Diagnoses     Cellulitis       Relevant Orders   US BREAST LTD UNI RIGHT INC AXILLA (Completed)   Post op infection       Relevant Medications   linezolid (ZYVOX) IVPB 600 mg (Completed)   DAPTOmycin (CUBICIN) 650 mg in sodium chloride 0.9 % IVPB   ceFAZolin (ANCEF) 2-4 GM/100ML-% IVPB (Completed)   Other Relevant Orders   DG  Foot Complete Left (Completed)       -Patient seen and evaluated at bedside -Dressing change preformed; surgical site healing well with no acute concerns -Advised patient to make sure to keep dressing clean, dry, and intact to Left foot; Podiatry to change -Continue with antibiotics per ID recommendations  -Weightbearing to heel with post op shoe for bedside and bathroom only with assistive device  -Continue with rest and elevation to assist with pain and edema control  -Podiatry will continue to follow closely  -Anticipated discharge plan of care: To home with antibiotics per ID. Patient will keep dressing intact. Patient after discharge to follow up in office within 1-2 weeks for continued post op care.   Dr. Landis Martins   [facility] [phone] office [phone] cell Available via secure chat

## 2021-11-28 NOTE — Progress Notes (Signed)
Pharmacy Antibiotic Note  Jasmine Richardson is a 60 y.o. female admitted on 11/21/2021 with left foot infection s/p prior toe amputations, cellulitis with poor wound healing, and increased draining associated with fever/chills. She was found to have 1 of 4 blood cultures growing MRSA. She is s/p left transmetatarsal amputation on 12/8. OR culture grew rare E faecalis which is covered by daptomycin. TEE neg for IE. She is afebrile, WBC down to 12s, and renal function is at baseline.  Plan: Continue daptomycin 650 mg IV daily Continue cefepime 2 g IV q8h Monitor CBC, renal function, cultures, and clinical progress CK weekly on Thurs  Height: 5\' 7"  (170.2 cm) Weight: 104.5 kg (230 lb 6.1 oz) IBW/kg (Calculated) : 61.6  Temp (24hrs), Avg:98 F (36.7 C), Min:97.2 F (36.2 C), Max:98.4 F (36.9 C)  Recent Labs  Lab 11/21/21 1828 11/23/21 0439 11/24/21 0230 11/26/21 0230 11/27/21 0451 11/28/21 0139  WBC 18.1* 17.2* 12.9* 10.9* 12.2* 12.8*  CREATININE 0.96 1.04* 1.17* 0.87 0.79 0.81  LATICACIDVEN 1.7  --   --   --   --   --      Estimated Creatinine Clearance: 90.7 mL/min (by C-G formula based on SCr of 0.81 mg/dL).    Allergies  Allergen Reactions   Chlorhexidine Gluconate Itching    Received CHG bath, began itching, required benadryl   Cat Hair Extract Other (See Comments)    Sneezing, watery eyes.   Tramadol Other (See Comments)    Hallucinations   Vancomycin     Other reaction(s): Anaphylaxis*   Codeine Rash    Antimicrobials this admission: Cefepime 12/8>> Zosyn 12/6>>12/7 Zyvox 12/6>12/7 Dapto 12/7>>  Microbiology results: 12/6 BCx: 1/4 bottles MRSA 12/8 wound cx: rare E faecalis (S-amp, vanc) 12/8 MRSA PCR: negative 12/9 BCx: ngtd  Thank you for involving pharmacy in this patient's care.  Renold Genta, PharmD, BCPS Clinical Pharmacist Clinical phone for 11/28/2021 until 3p is [phone] 11/28/2021 8:19 AM  **Pharmacist phone directory can be found on  [email or web address] listed under [place]**

## 2021-11-28 NOTE — Progress Notes (Signed)
[facility]  Date of Admission:  11/21/2021   Total days of inpatient antibiotics 4  Principal Problem:   Diabetic foot infection ([place]) Active Problems:   Chronic diastolic heart failure (HCC)   Coronary artery disease involving native coronary artery of native heart with angina pectoris (HCC)   Essential hypertension   GERD (gastroesophageal reflux disease)   S/P CABG (coronary artery bypass graft)   Ventricular tachycardia   ICD (implantable cardioverter-defibrillator) in place   Hyperglycemia due to type 2 diabetes mellitus ([place])   Bacteremia      Assessment:    60 year old female with uncontrolled diabetes with A1c 12.8, diabetic left foot wounds status post I&D on 10/10/21 followed by podiatry, V. tach status post ICD implantation admitted for sepsis secondary to left foot wound.  ID is following due to MRSA bacteremia.   # MRSA Bacteremia 2/2 diabetic left foot wound SP left transmetatarsal amputation with Cx + Enterococcus faecalis #ICD #Uncontrolled diabetes #Vancomycin allergy #Breast cellulitis SP I&D on 10/23/21 Procedures prior to hospitalization: -SP 1st metatarsal revision on 05/02/21 with residual OR Cx+ staph aureus, treated with bactrim x 2 weeks. Suspect pt had residual OM and did not receive antibiotics past 2 weeks.  -I&D Cx of left foot wound from 10/10/21 are form deep wound, does not appear they are from bone. Treated with 2 weeks of antibiotics(augment/fluconazole x7 days). - MRI of left foot on 11/22/21 showed suspicion for osteomyelitis of 1st/2nd/3rd metatarsal with diffuse cellulitis and myofascitis. Pt went to OR on 11/23/21 for left transmetatarsal amputation. -Pt reports right breast erythema started at the same time as her foot worsened.  There is a note by Dr. Noberto Retort on 10/23/21([facility] in [place])where right breast wound was debrided (gangrenous skin/subcutaneous tissue). [place]  of breast on 12/7 no sign of  abscess. -TTE showed 0.6x1 cm mass on LCC most consistent with asymmetric calcification, seen on prior study. TEE showed no vegetation on lead or valves, PFO present  Recommendations: -Continue  daptomycin to complete 6 weeks of antibiotics for osteomyelitis from OR. This course would be sufficient for MRSA bacteremia as well -Wound care for breast wound  OPAT ORDERS:  Diagnosis: MRSA bacteremia and Osteomyelitis of left foot  Culture Result: MRSA blood Cx, Enterococcus OR cx  Allergies  Allergen Reactions   Chlorhexidine Gluconate Itching    Received CHG bath, began itching, required benadryl   Cat Hair Extract Other (See Comments)    Sneezing, watery eyes.   Tramadol Other (See Comments)    Hallucinations   Vancomycin     Other reaction(s): Anaphylaxis*   Codeine Rash     Discharge antibiotics to be given via PICC line:  Per pharmacy protocol daptomycin    Duration: 6 weeks End Date: 01/08/22  [facility] Care Per Protocol with [employer] Use: Home health RN for IV administration and teaching, line care and labs.    Labs weekly while on IV antibiotics: __ CBC with differential __ CMP __ CRP __ ESR __ CK  __ Please pull PIC at completion of IV antibiotics   Fax weekly labs to [phone]  Clinic Follow Up Appt: 12/20/21 at 1:45pm  @ [facility] with Dr. Candiss Norse    Microbiology:   Antibiotics: Linezolid 12/06-12/7 Pip-tazo 12/06 Cefepime 12/8-12/11 Daptomycin 12/07-p  Cultures: Blood 12/06 + MRSA  12/07 NGTD 12/8 OR Cx E. faecalis gram stain: no organisms SUBJECTIVE: Today pt reports she would like to leave. She is agreable  to PICC line and home IV antibiotics  Interval: Afebrile overnight, wbc 12.8K. Review of Systems: Review of Systems  All other systems reviewed and are negative.   Scheduled Meds: REM  Continuous Infusions: REM  PRN Meds:.REM Allergies  Allergen Reactions   Chlorhexidine Gluconate Itching    Received CHG bath, began itching,  required benadryl   Cat Hair Extract Other (See Comments)    Sneezing, watery eyes.   Tramadol Other (See Comments)    Hallucinations   Vancomycin     Other reaction(s): Anaphylaxis*   Codeine Rash    OBJECTIVE: Vitals:   11/27/21 1415 11/27/21 2130 11/28/21 0606 11/28/21 1110  BP: 101/67 (!) 153/72 121/79 131/78  Pulse: 77 85 80 85  Resp: (!) '21 18 18 18  ' Temp:  98.4 F (36.9 C) 98.2 F (36.8 C) 98.4 F (36.9 C)  TempSrc:  Oral Oral Oral  SpO2: 100% 97% 100% 99%  Weight:   104.5 kg   Height:       Body mass index is 36.08 kg/m.  Physical Exam Constitutional:      Appearance: Normal appearance.  HENT:     Head: Normocephalic and atraumatic.     Right Ear: Tympanic membrane normal.     Left Ear: Tympanic membrane normal.     Nose: Nose normal.     Mouth/Throat:     Mouth: Mucous membranes are moist.  Eyes:     Extraocular Movements: Extraocular movements intact.     Conjunctiva/sclera: Conjunctivae normal.     Pupils: Pupils are equal, round, and reactive to light.  Cardiovascular:     Rate and Rhythm: Normal rate and regular rhythm.     Heart sounds: No murmur heard.   No friction rub. No gallop.  Pulmonary:     Effort: Pulmonary effort is normal.     Breath sounds: Normal breath sounds.  Abdominal:     General: Abdomen is flat.     Palpations: Abdomen is soft.  Musculoskeletal:     Comments: LLE bandaged  Skin:    General: Skin is warm and dry.  Neurological:     General: No focal deficit present.     Mental Status: She is alert and oriented to person, place, and time.  Psychiatric:        Mood and Affect: Mood normal.      Lab Results Lab Results  Component Value Date   WBC 12.8 (H) 11/28/2021   HGB 8.3 (L) 11/28/2021   HCT 25.0 (L) 11/28/2021   MCV 83.6 11/28/2021   PLT 506 (H) 11/28/2021    Lab Results  Component Value Date   CREATININE 0.81 11/28/2021   BUN 8 11/28/2021   NA 134 (L) 11/28/2021   K 3.3 (L) 11/28/2021   CL 102  11/28/2021   CO2 22 11/28/2021    Lab Results  Component Value Date   ALT 19 11/21/2021   AST 21 11/21/2021   ALKPHOS 54 11/21/2021   BILITOT 0.7 11/21/2021        Laurice Record, [facility] 11/28/2021, 11:51 PM

## 2021-11-28 NOTE — Discharge Summary (Signed)
Physician Discharge Summary  Jasmine Richardson IEP:[id number] DOB: 12/28/1960 DOA: 11/21/2021  PCP: Nicholos Johns, MD  Admit date: 11/21/2021 Discharge date: 11/28/2021  Time spent: 35 minutes  Recommendations for Outpatient Follow-up:  Infectious disease Dr. Candiss Norse in 75 month Podiatry Dr. Cannon Kettle in 1 to 2 weeks PCP in 1 week Home health RN, continue IV daptomycin for 6 weeks until 1/19  Discharge Diagnoses:   MRSA bacteremia Sepsis Left diabetic foot infection Osteomyelitis of toes, metatarsals Peripheral arterial disease Postop blood loss anemia Type 2 diabetes mellitus Chronic diastolic heart failure (HCC) Coronary artery disease involving native coronary artery of native heart with angina pectoris (HCC) Essential hypertension GERD (gastroesophageal reflux disease) S/P CABG (coronary artery bypass graft) Ventricular tachycardia ICD (implantable cardioverter-defibrillator) in place Hyperglycemia due to type 2 diabetes mellitus ([place]) Bacteremia   Discharge Condition: Stable  Diet recommendation: Diabetic, low-sodium  Filed Weights   11/27/21 0402 11/27/21 1244 11/28/21 0606  Weight: 105.9 kg 105.9 kg 104.5 kg    History of present illness:   60/F with history of CAD, COPD, chronic systolic/diastolic CHF, type 2 diabetes mellitus, anxiety, depression, history of V. tach, ICD presented to the ED with worsening left foot pain. -Recent left great toe amputation in October, followed by admission for I&D. -In the emergency room she was febrile to 100.9, labs noted WBC of 18, foot x-ray noted fracture base of left second phalanx with displacement  Hospital Course:   Sepsis, POA Left diabetic foot infection, recent toe amputation and debridement Osteomyelitis of toes, metatarsals MRSA bacteremia -Podiatry consulted, MRI foot noted findings suspicious for osteomyelitis involving first metatarsal, second and third metatarsal heads and the second and third proximal phalanges,  myositis -Underwent transmetatarsal amputation 12/8 Dr. Blenda Mounts -Treated with IV vancomycin and cefepime initially, then transitioned to daptomycin -Infectious disease consulted, repeat blood cultures 12/9 are negative  -2D echo noted preserved EF, no vegetations noted, small, highly echogenic, well circumscribed mass measuring  0.6x1.0cm on the [place] most consistent with asymmetric calcification. This  was also present on prior study and unlikely to represent a vegetation -TEE negative for endocarditis -Intraoperative cultures with rare Enterococcus faecalis only -Infectious disease team recommended 6 weeks of IV daptomycin till 1/19 to complete antibiotic therapy   PAD -Recently underwent aortogram, left anterior tibial artery angioplasty by Dr. Carlis Abbott on 10/26 -Continue aspirin Plavix statin   Postop blood loss anemia -Anemia panel with severe iron deficiency,  -given IV iron, hemoglobin 8.3 today   Type 2 diabetes mellitus -Continue Semglee, NovoLog with meals, dose increased -Hemoglobin A1c is 10.7   Asymptomatic pyuria -Urinalysis abnormal with positive nitrite, small leukocytes, > 50 WBCs, patient denies any symptoms whatsoever, on antibiotics for above   Chronic systolic and diastolic CHF CAD Hypertension -Clinically euvolemic at this time, continue home regimen of aspirin, Plavix,  metoprolol, Lasix -holding valsartan, imdur -Continue p.o. Lasix -Echo with EF of 50-55%   History of V. Tach s/p AICD -Continue amiodarone   History of gangrenous skin/subcutaneous tissue of right breast -s/p excisional debridement by Dr. Leonidas Romberg 11/7 -Now with large scab, no surrounding  tenderness erythema or warmth  Consultants:  Podiatry, infectious disease   Procedures: Transmetatarsal amputation Dr. Blenda Mounts 12/8  TEE: Negative for endocarditis Dr. Harrington Challenger 12/12    Discharge Exam: Vitals:   11/28/21 0606 11/28/21 1110  BP: 121/79 131/78  Pulse: 80 85  Resp: 18 18  Temp: 98.2 F  (36.8 C) 98.4 F (36.9 C)  SpO2: 100% 99%   Obese pleasant female sitting  up in bed, AAOx3, no distress  HEENT: No JVD CVS: S1-S2, regular rate rhythm Lungs: Clear bilaterally Abdomen: Soft, nontender, nondistended, bowel sounds present  Extremities:  Left foot with transmetatarsal amputation, dressing  Skin: No rash on exposed skin  psychiatry:  Mood & affect appropriate.   Discharge Instructions   Discharge Instructions     [facility] pharmacist to adjust dose for Vancomycin, Aminoglycosides and other anti-infective therapies as requested by physician.   Complete by: As directed    [facility] to provide Cath Flo 36m   Complete by: As directed    Administer for PICC line occlusion and as ordered by physician for other access device issues.   Anaphylaxis Kit: Provided to treat any anaphylactic reaction to the medication being provided to the patient if First Dose or when requested by physician   Complete by: As directed    Epinephrine 115mml vial / amp: Administer 0.9m54m0.9ml39mubcutaneously once for moderate to severe anaphylaxis, nurse to call physician and pharmacy when reaction occurs and call 911 if needed for immediate care   Diphenhydramine 50mg14mIV vial: Administer 25-50mg 58mM PRN for first dose reaction, rash, itching, mild reaction, nurse to call physician and pharmacy when reaction occurs   Sodium Chloride 0.9% NS 500ml I61mdminister if needed for hypovolemic blood pressure drop or as ordered by physician after call to physician with anaphylactic reaction   Change dressing on IV access line weekly and PRN   Complete by: As directed    Diet - low sodium heart healthy   Complete by: As directed    Diet Carb Modified   Complete by: As directed    Discharge wound care:   Complete by: As directed    Betadine applied to wound with dry sterile dressings, changed daily   Flush IV access with Sodium Chloride 0.9% and Heparin 10 units/ml or 100  units/ml   Complete by: As directed    Home infusion instructions - [facility]   Complete by: As directed    Instructions: Flush IV access with Sodium Chloride 0.9% and Heparin 10units/ml or 100units/ml   Change dressing on IV access line: Weekly and PRN   Instructions Cath Flo 2mg: Ad87mister for PICC Line occlusion and as ordered by physician for other access device   [facility] pharmacist to adjust dose for: Vancomycin, Aminoglycosides and other anti-infective therapies as requested by physician   Increase activity slowly   Complete by: As directed    Method of administration may be changed at the discretion of home infusion pharmacist based upon assessment of the patient and/or caregivers ability to self-administer the medication ordered   Complete by: As directed       Allergies as of 11/28/2021       Reactions   Chlorhexidine Gluconate Itching   Received CHG bath, began itching, required benadryl   Cat Hair Extract Other (See Comments)   Sneezing, watery eyes.   Tramadol Other (See Comments)   Hallucinations   Vancomycin    Other reaction(s): Anaphylaxis*   Codeine Rash        Medication List     STOP taking these medications    Repatha SureClick 140 MG/M793oaj Generic drug: Evolocumab       TAKE these medications    acetaminophen 325 MG tablet Commonly known as: TYLENOL Take 2 tablets (650 mg total) by mouth every 4 (four) hours as needed for headache or mild pain.   amiodarone 200  MG tablet Commonly known as: PACERONE Take 1 tablet (200 mg total) by mouth daily.   aspirin EC 81 MG tablet Take 81 mg by mouth daily. Swallow whole.   cholecalciferol 25 MCG (1000 UNIT) tablet Commonly known as: VITAMIN D3 Take 1,000 Units by mouth 2 (two) times a week.   clopidogrel 75 MG tablet Commonly known as: PLAVIX Take 75 mg by mouth daily.   cyanocobalamin 1000 MCG tablet Take 1,000 mcg by mouth daily.   daptomycin  IVPB Commonly  known as: CUBICIN Inject 650 mg into the vein daily. Indication:  MRSA bacteremia First Dose: No Last Day of Therapy:  01/04/2022 Labs - Once weekly:  CBC/D, BMP, and CPK Labs - Every other week:  ESR and CRP Method of administration: IV Push Method of administration may be changed at the discretion of home infusion pharmacist based upon assessment of the patient and/or caregiver's ability to self-administer the medication ordered.   Farxiga 10 MG Tabs tablet Generic drug: dapagliflozin propanediol Take 10 mg by mouth daily.   fenofibrate 145 MG tablet Commonly known as: Tricor Take 1 tablet (145 mg total) by mouth daily.   furosemide 20 MG tablet Commonly known as: LASIX Take 20 mg by mouth daily.   HumaLOG KwikPen 100 UNIT/ML KwikPen Generic drug: insulin lispro Inject 10-50 Units into the skin at bedtime. Sliding scale   HYDROmorphone 2 MG tablet Commonly known as: Dilaudid Take 1 tablet (2 mg total) by mouth every 6 (six) hours as needed for up to 5 days for severe pain or moderate pain.   isosorbide mononitrate 30 MG 24 hr tablet Commonly known as: IMDUR Take 1 tablet (30 mg total) by mouth daily.   metFORMIN 500 MG tablet Commonly known as: GLUCOPHAGE Take 1 tablet (500 mg total) by mouth 2 (two) times daily.   metoprolol succinate 25 MG 24 hr tablet Commonly known as: Toprol XL Take 1 tablet (25 mg total) by mouth 3 (three) times daily.   nitroGLYCERIN 0.4 MG SL tablet Commonly known as: NITROSTAT Place 1 tablet (0.4 mg total) under the tongue every 5 (five) minutes as needed for chest pain.   nystatin powder Commonly known as: MYCOSTATIN/NYSTOP Apply 1 application topically daily as needed (rash/yeast).   omeprazole 20 MG capsule Commonly known as: PRILOSEC Take 20 mg by mouth daily.   rosuvastatin 20 MG tablet Commonly known as: CRESTOR Take 1 tablet (20 mg total) by mouth at bedtime.   senna-docusate 8.6-50 MG tablet Commonly known as:  Senokot-S Take 1 tablet by mouth 2 (two) times daily.   Tyler Aas FlexTouch 100 UNIT/ML FlexTouch Pen Generic drug: insulin degludec Inject 30 Units into the skin daily. What changed: how much to take   valsartan 80 MG tablet Commonly known as: Diovan Take 1 tablet (80 mg total) by mouth 2 (two) times daily.   vitamin C 250 MG tablet Commonly known as: ASCORBIC ACID Take 250 mg by mouth at bedtime.   Vitamin D (Ergocalciferol) 1.25 MG (50000 UNIT) Caps capsule Commonly known as: DRISDOL Take 50,000 Units by mouth every Saturday.               Durable Medical Equipment  (From admission, onward)           Start     Ordered   11/22/21 0810  For home use only DME Other see comment  Once       Comments: Post op shoe  Question:  Length of Need  Answer:  6  Months   11/22/21 0809              Discharge Care Instructions  (From admission, onward)           Start     Ordered   11/28/21 0000  Change dressing on IV access line weekly and PRN  (Home infusion instructions - [facility] )        11/28/21 1330   11/28/21 0000  Discharge wound care:       Comments: Betadine applied to wound with dry sterile dressings, changed daily   11/28/21 1330           Allergies  Allergen Reactions   Chlorhexidine Gluconate Itching    Received CHG bath, began itching, required benadryl   Cat Hair Extract Other (See Comments)    Sneezing, watery eyes.   Tramadol Other (See Comments)    Hallucinations   Vancomycin     Other reaction(s): Anaphylaxis*   Codeine Rash      The results of significant diagnostics from this hospitalization (including imaging, microbiology, ancillary and laboratory) are listed below for reference.    Significant Diagnostic Studies: MR FOOT LEFT WO CONTRAST  Result Date: 11/22/2021 CLINICAL DATA:  Foot pain and swelling. Diabetic with history of prior amputation. EXAM: MRI OF THE LEFT FOOT WITHOUT CONTRAST TECHNIQUE:  Multiplanar, multisequence MR imaging of the left foot was performed. No intravenous contrast was administered. COMPARISON:  Multiple recent radiographs and foot CT from 10/09/2021 FINDINGS: Examination is quite limited due to patient motion and poor fat saturation. Surgical changes from prior amputation at the mid first metatarsal level. There is an overlying open wound which appears to extend right down to the bone. Abnormal T1 and T2 signal intensity in the first metatarsal highly suspicious for osteomyelitis. There is also abnormal T1 and T2 signal density in the second and third metatarsal heads consistent with osteomyelitis. Abnormal signal intensity in the second and third proximal phalanges also suspicious for osteomyelitis. Could not exclude septic arthritis at the second and third MTP joints. Diffuse cellulitis and myofasciitis without obvious discrete drainable soft tissue abscess. IMPRESSION: 1. Limited examination due to patient motion and poor fat saturation. 2. Findings highly suspicious for osteomyelitis involving the first metatarsal, second and third metatarsal heads and the second and third proximal phalanges. 3. Diffuse cellulitis and myofasciitis without obvious discrete drainable soft tissue abscess. Electronically Signed   By: Marijo Sanes M.D.   On: 11/22/2021 16:04   DG Foot Complete Left  Result Date: 11/23/2021 CLINICAL DATA:  Status post partial resection of left foot EXAM: LEFT FOOT - COMPLETE 3+ VIEW COMPARISON:  11/22/2027 FINDINGS: There is interval transmetatarsal amputation of mid foot. Skin staples are seen in place. There are no definite opaque foreign bodies. There are no focal lytic lesions in the visualized bony structures. Plantar spur is seen in calcaneus. Surgical drain is seen in the soft tissues adjacent to the surgical site. IMPRESSION: Status post transmetatarsal amputation of left foot. Electronically Signed   By: Elmer Picker M.D.   On: 11/23/2021 16:18    DG Foot Complete Left  Result Date: 11/21/2021 CLINICAL DATA:  Left foot pain EXAM: LEFT FOOT - COMPLETE 3+ VIEW COMPARISON:  None. FINDINGS: Three view radiograph left foot demonstrates surgical changes of a first ray transmetatarsal amputation. There is a subacute intra-articular fracture of the base of second proximal phalanx with lateral dislocation of the a second MTP joint.z no other fracture or dislocation. Extensive soft  tissue swelling involving the left forefoot. Superior and plantar calcaneal spurs are present. Vascular calcifications are seen. IMPRESSION: Intra-articular fracture of the base of the second proximal phalanx with lateral dislocation of the second MTP joint. Electronically Signed   By: Fidela Salisbury M.D.   On: 11/21/2021 19:05   DG Foot Complete Left  Result Date: 11/13/2021 Please see detailed radiograph report in office note.  US BREAST LTD UNI RIGHT INC AXILLA  Result Date: 11/22/2021 CLINICAL DATA:  Right breast erythema EXAM: LIMITED ULTRASOUND OF THE RIGHT BREAST COMPARISON:  None. FINDINGS: Limited grayscale sonography was performed in the area of focal erythema. These images demonstrate no subcutaneous fluid collection to suggest a subcutaneous abscess. IMPRESSION: No subcutaneous fluid collection identified. Electronically Signed   By: Fidela Salisbury M.D.   On: 11/22/2021 19:38   VAS [place] ABI WITH/WO TBI  Result Date: 11/14/2021  LOWER EXTREMITY DOPPLER STUDY Patient Name:  JAEDIN TRUMBO  Date of Exam:   11/14/2021 Medical Rec #: [id number]      Accession #:    [id number] Date of Birth: 07-12-1961     Patient Gender: F Patient Age:   19 years Exam Location:  [facility] Procedure:      VAS [place] ABI WITH/WO TBI Referring Phys: Monica Martinez --------------------------------------------------------------------------------  Indications: Right great toe amputation 02/20/20. left great toe amputtion10/19/22              at [facility].  Vascular  Interventions: 10/11/21: Left ATA angioplasty. Performing Technologist: Ralene Cork RVT  Examination Guidelines: A complete evaluation includes at minimum, Doppler waveform signals and systolic blood pressure reading at the level of bilateral brachial, anterior tibial, and posterior tibial arteries, when vessel segments are accessible. Bilateral testing is considered an integral part of a complete examination. Photoelectric Plethysmograph (PPG) waveforms and toe systolic pressure readings are included as required and additional duplex testing as needed. Limited examinations for reoccurring indications may be performed as noted.  ABI Findings: +--------+------------------+-----+----------+--------+  Right    Rt Pressure (mmHg) Index Waveform   Comment   +--------+------------------+-----+----------+--------+  Brachial 167                                           +--------+------------------+-----+----------+--------+  PTA      120                0.72  monophasic           +--------+------------------+-----+----------+--------+  DP       134                0.80  monophasic           +--------+------------------+-----+----------+--------+ +--------+------------------+-----+----------+-------+  Left     Lt Pressure (mmHg) Index Waveform   Comment  +--------+------------------+-----+----------+-------+  Brachial 158                                          +--------+------------------+-----+----------+-------+  PTA      124                0.74  monophasic          +--------+------------------+-----+----------+-------+  DP       178  1.07  monophasic          +--------+------------------+-----+----------+-------+ +-------+-----------+-----------+------------+------------+  ABI/TBI Today's ABI Today's TBI Previous ABI Previous TBI  +-------+-----------+-----------+------------+------------+  Right   0.8         amputation                              +-------+-----------+-----------+------------+------------+  Left    1.07        amputation                             +-------+-----------+-----------+------------+------------+  Arterial wall calcification precludes accurate ankle pressures and ABIs. No previous ABI.  Summary: Right: Resting right ankle-brachial index indicates mild right lower extremity arterial disease. Left: Resting left ankle-brachial index is within normal range.  *See table(s) above for measurements and observations.  Electronically signed by Monica Martinez MD on 11/14/2021 at 4:35:30 PM.    Final    ECHOCARDIOGRAM COMPLETE  Result Date: 11/23/2021    ECHOCARDIOGRAM REPORT   Patient Name:   EMMERY SEILER Date of Exam: 11/23/2021 Medical Rec #:  [id number]     Height:       67.0 in Accession #:    [id number]    Weight:       231.0 lb Date of Birth:  24-Apr-1961    BSA:          2.150 m Patient Age:    13 years      BP:           149/90 mmHg Patient Gender: F             HR:           84 bpm. Exam Location:  Inpatient Procedure: 2D Echo, Cardiac Doppler, Color Doppler and Intracardiac            Opacification Agent Indications:    Bacteremia  History:        Patient has prior history of Echocardiogram examinations, most                 recent 12/18/2020. CAD, Arrythmias:Tachycardia; Risk                 Factors:Hypertension, Dyslipidemia and Diabetes.  Sonographer:    Bernadene Person RDCS Referring Phys: [id number] [facility]  1. Left ventricular ejection fraction, by estimation, is 50 to 55%. The left ventricle has low normal function. The LV endocardium is incompletely visualized and it is difficult to assess wall motion. Based on available views, the mid-to-apical inferior  and apical inferoseptal walls are mildly hypokinetic. There is mild concentric left ventricular hypertrophy. Left ventricular diastolic parameters are consistent with Grade I diastolic dysfunction (impaired relaxation).  2. Right ventricular systolic  function is normal. The right ventricular size is normal. There is normal pulmonary artery systolic pressure.  3. The mitral valve is normal in structure. Trivial mitral valve regurgitation. No evidence of mitral stenosis.  4. The aortic valve is tricuspid. There is mild-to-moderate calcification of the aortic valve. There is mild thickening of the aortic valve. Aortic valve regurgitation is not visualized. Aortic valve sclerosis/calcification is present, without any evidence of aortic stenosis.  5. There is a small, highly echogenic, well circumscribed mass measuring 0.6x1.0cm on the [place] most consistent with asymmetric calcification. This was also present on prior study and unlikely to represent a vegetation.  6. No valvular vegetations visualized. Consider  TEE to exclude infective endocarditis if clinically indicated. Conclusion(s)/Recommendation(s): No evidence of valvular vegetations on this transthoracic echocardiogram. Consider a transesophageal echocardiogram to exclude infective endocarditis if clinically indicated. FINDINGS  Left Ventricle: Left ventricular ejection fraction, by estimation, is 50 to 55%. The left ventricle has low normal function. The left ventricle demonstrates regional wall motion abnormalities. The mid-to-apical inferior and apical inferoseptal walls are  mildly hypokinetic. The left ventricular internal cavity size was normal in size. There is mild concentric left ventricular hypertrophy. Left ventricular diastolic parameters are consistent with Grade I diastolic dysfunction (impaired relaxation). Right Ventricle: The right ventricular size is normal. No increase in right ventricular wall thickness. Right ventricular systolic function is normal. There is normal pulmonary artery systolic pressure. The tricuspid regurgitant velocity is 2.10 m/s, and  with an assumed right atrial pressure of 8 mmHg, the estimated right ventricular systolic pressure is 63.1 mmHg. Left Atrium: Left atrial  size was normal in size. Right Atrium: Right atrial size was normal in size. Pericardium: There is no evidence of pericardial effusion. Mitral Valve: The mitral valve is normal in structure. Mild mitral annular calcification. Trivial mitral valve regurgitation. No evidence of mitral valve stenosis. There is no evidence of mitral valve vegetation. Tricuspid Valve: The tricuspid valve is normal in structure. Tricuspid valve regurgitation is trivial. There is no evidence of tricuspid valve vegetation. Aortic Valve: The aortic valve is tricuspid. There is mild-to-moderate calcification of the aortic valve. There is mild thickening of the aortic valve. Aortic valve regurgitation is not visualized. Aortic valve sclerosis/calcification is present, without  any evidence of aortic stenosis. Pulmonic Valve: The pulmonic valve was normal in structure. Pulmonic valve regurgitation is trivial. There is no evidence of pulmonic valve vegetation. Aorta: The aortic root and ascending aorta are structurally normal, with no evidence of dilitation. Venous: The inferior vena cava was not well visualized. IAS/Shunts: No atrial level shunt detected by color flow Doppler.  LEFT VENTRICLE PLAX 2D LVIDd:         3.80 cm   Diastology LVIDs:         2.40 cm   LV e' medial:    5.34 cm/s LV PW:         1.40 cm   LV E/e' medial:  15.4 LV IVS:        1.20 cm   LV e' lateral:   8.15 cm/s LVOT diam:     2.10 cm   LV E/e' lateral: 10.1 LV SV:         64 LV SV Index:   30 LVOT Area:     3.46 cm  RIGHT VENTRICLE RV S prime:     4.78 cm/s TAPSE (M-mode): 1.2 cm LEFT ATRIUM             Index        RIGHT ATRIUM           Index LA diam:        3.70 cm 1.72 cm/m   RA Area:     15.20 cm LA Vol (A2C):   42.7 ml 19.86 ml/m  RA Volume:   36.60 ml  17.02 ml/m LA Vol (A4C):   48.0 ml 22.33 ml/m LA Biplane Vol: 46.0 ml 21.40 ml/m  AORTIC VALVE LVOT Vmax:   112.00 cm/s LVOT Vmean:  70.000 cm/s LVOT VTI:    0.184 m  AORTA Ao Root diam: 3.30 cm Ao Asc diam:   3.10 cm MITRAL VALVE  TRICUSPID VALVE MV Area (PHT): 5.31 cm     TR Peak grad:   17.6 mmHg MV Decel Time: 143 msec     TR Vmax:        210.00 cm/s MV E velocity: 82.00 cm/s MV A velocity: 104.00 cm/s  SHUNTS MV E/A ratio:  0.79         Systemic VTI:  0.18 m                             Systemic Diam: 2.10 cm Gwyndolyn Kaufman MD Electronically signed by Gwyndolyn Kaufman MD Signature Date/Time: 11/23/2021/4:40:43 PM    Final    ECHO TEE  Result Date: 11/27/2021    TRANSESOPHOGEAL ECHO REPORT   Patient Name:   Darleen Crocker Date of Exam: 11/27/2021 Medical Rec #:  [id number]     Height:       67.0 in Accession #:    [id number]    Weight:       233.5 lb Date of Birth:  [birth year]    BSA:          2.160 m Patient Age:    83 years      BP:           159/94 mmHg Patient Gender: F             HR:           84 bpm. Exam Location:  Inpatient Procedure: Transesophageal Echo, 3D Echo and Color Doppler                             MODIFIED REPORT: This report was modified by Dorris Carnes MD on 11/27/2021 due to complete.  Indications:     Bacteremia  History:         Patient has prior history of Echocardiogram examinations, most                  recent 11/23/2021. CHF, Prior CABG and Defibrillator; Risk                  Factors:Diabetes and Dyslipidemia.  Sonographer:     Raquel Sarna Senior RDCS Referring Phys:  [id number] Leanor Kail Diagnosing Phys: Dorris Carnes MD PROCEDURE: After discussion of the risks and benefits of a TEE, an informed consent was obtained from the patient. The transesophogeal probe was passed without difficulty through the esophogus of the patient. Local oropharyngeal anesthetic was provided with viscous lidocaine. Sedation performed by different physician. The patient was monitored while under deep sedation. Anesthestetic sedation was provided intravenously by Anesthesiology: 527m of Propofol, 454mof Lidocaine. The patient developed no complications during the procedure. IMPRESSIONS  1.  Device lead seen in RA, RV without obvious vegetations.  2. No obvious vegetations seen.  3. Left ventricular ejection fraction, by estimation, is 50 to 55%. The left ventricle has low normal function.  4. Right ventricular systolic function is normal. The right ventricular size is normal.  5. No left atrial/left atrial appendage thrombus was detected.  6. The mitral valve is normal in structure. Trivial mitral valve regurgitation.  7. The aortic valve is tricuspid. Aortic valve regurgitation is not visualized. Aortic valve sclerosis is present, with no evidence of aortic valve stenosis.  8. Evidence of atrial level shunting detected by color flow Doppler. Agitated saline contrast bubble study was positive with shunting observed within 3-6 cardiac cycles suggestive of  interatrial shunt. FINDINGS  Left Ventricle: Left ventricular ejection fraction, by estimation, is 50 to 55%. The left ventricle has low normal function. The left ventricular internal cavity size was normal in size. Right Ventricle: The right ventricular size is normal. Right vetricular wall thickness was not assessed. Right ventricular systolic function is normal. Left Atrium: Left atrial size was normal in size. No left atrial/left atrial appendage thrombus was detected. Right Atrium: Right atrial size was normal in size. Pericardium: There is no evidence of pericardial effusion. Mitral Valve: The mitral valve is normal in structure. Trivial mitral valve regurgitation. Tricuspid Valve: The tricuspid valve is normal in structure. Tricuspid valve regurgitation is trivial. Aortic Valve: The aortic valve is tricuspid. Aortic valve regurgitation is not visualized. Aortic valve sclerosis is present, with no evidence of aortic valve stenosis. Pulmonic Valve: The pulmonic valve was normal in structure. Pulmonic valve regurgitation is trivial. Aorta: The aortic root is normal in size and structure. There is minimal (Grade I) plaque. IAS/Shunts: Evidence of  atrial level shunting detected by color flow Doppler. Agitated saline contrast bubble study was positive with shunting observed within 3-6 cardiac cycles suggestive of interatrial shunt. Additional Comments: A device lead is visualized. Dorris Carnes MD Electronically signed by Dorris Carnes MD Signature Date/Time: 11/27/2021/6:08:07 PM    Final (Updated)    VAS [place] LOWER EXTREMITY ARTERIAL DUPLEX  Result Date: 11/14/2021 LOWER EXTREMITY ARTERIAL DUPLEX STUDY Patient Name:  THERMA LASURE  Date of Exam:   11/14/2021 Medical Rec #: [id number]      Accession #:    [id number] Date of Birth: 1961-04-17     Patient Gender: F Patient Age:   31 years Exam Location:  [facility] Procedure:      VAS [place] LOWER EXTREMITY ARTERIAL DUPLEX Referring Phys: Monica Martinez --------------------------------------------------------------------------------  Indications: Right great toe amputation 02/20/20. left great toe amputtion10/19/22              at [facility].  Vascular Interventions: 10/11/21: Left ATA angioplasty. Current ABI:            Right; 0.8 Left: 1.07 Comparison Study: none Performing Technologist: Ralene Cork RVT  Examination Guidelines: A complete evaluation includes B-mode imaging, spectral Doppler, color Doppler, and power Doppler as needed of all accessible portions of each vessel. Bilateral testing is considered an integral part of a complete examination. Limited examinations for reoccurring indications may be performed as noted.  +-----------+--------+-----+--------+----------+--------------+  LEFT        PSV cm/s Ratio Stenosis Waveform   Comments        +-----------+--------+-----+--------+----------+--------------+  CFA Distal  114                     triphasic                  +-----------+--------+-----+--------+----------+--------------+  DFA         80                      biphasic                   +-----------+--------+-----+--------+----------+--------------+  SFA Prox    179                      triphasic                  +-----------+--------+-----+--------+----------+--------------+  SFA Mid     139  triphasic                  +-----------+--------+-----+--------+----------+--------------+  SFA Distal  71                      triphasic                  +-----------+--------+-----+--------+----------+--------------+  POP Prox    76                      triphasic                  +-----------+--------+-----+--------+----------+--------------+  POP Distal  110                     monophasic                 +-----------+--------+-----+--------+----------+--------------+  ATA Prox    67                      triphasic                  +-----------+--------+-----+--------+----------+--------------+  ATA Distal  97                      monophasic                 +-----------+--------+-----+--------+----------+--------------+  PTA Distal                          absent                     +-----------+--------+-----+--------+----------+--------------+  PERO Distal                                    not visualized  +-----------+--------+-----+--------+----------+--------------+  Summary: Left: Patent femoropopliteal arteries. PTA appears occluded. Unable to visualize the peroneal artery due to edema. Flow visualized in the proximal and distal ATA.  See table(s) above for measurements and observations. Electronically signed by Monica Martinez MD on 11/14/2021 at 4:35:59 PM.    Final    [place] EKG SITE RITE  Result Date: 11/28/2021 If Site Rite image not attached, placement could not be confirmed due to current cardiac rhythm.   Microbiology: Recent Results (from the past 240 hour(s))  Resp Panel by RT-PCR (Flu A&B, Covid) Nasopharyngeal Swab     Status: None   Collection Time: 11/21/21  6:18 PM   Specimen: Nasopharyngeal Swab; Nasopharyngeal(NP) swabs in vial transport medium  Result Value Ref Range Status   SARS Coronavirus 2 by RT PCR NEGATIVE NEGATIVE Final     Comment: (NOTE) SARS-CoV-2 target nucleic acids are NOT DETECTED.  The SARS-CoV-2 RNA is generally detectable in upper respiratory specimens during the acute phase of infection. The lowest concentration of SARS-CoV-2 viral copies this assay can detect is 138 copies/mL. A negative result does not preclude SARS-Cov-2 infection and should not be used as the sole basis for treatment or other patient management decisions. A negative result may occur with  improper specimen collection/handling, submission of specimen other than nasopharyngeal swab, presence of viral mutation(s) within the areas targeted by this assay, and inadequate number of viral copies(<138 copies/mL). A negative result must be combined with clinical observations, patient history, and epidemiological information. The expected result is Negative.  Fact Sheet  for Patients:  EntrepreneurPulse.com.au  Fact Sheet for Healthcare Providers:  IncredibleEmployment.be  This test is no t yet approved or cleared by the [place] FDA and  has been authorized for detection and/or diagnosis of SARS-CoV-2 by FDA under an Emergency Use Authorization (EUA). This EUA will remain  in effect (meaning this test can be used) for the duration of the COVID-19 declaration under Section 564(b)(1) of the Act, 21 U.S.C.section 360bbb-3(b)(1), unless the authorization is terminated  or revoked sooner.       Influenza A by PCR NEGATIVE NEGATIVE Final   Influenza B by PCR NEGATIVE NEGATIVE Final    Comment: (NOTE) The Xpert Xpress SARS-CoV-2/FLU/RSV plus assay is intended as an aid in the diagnosis of influenza from Nasopharyngeal swab specimens and should not be used as a sole basis for treatment. Nasal washings and aspirates are unacceptable for Xpert Xpress SARS-CoV-2/FLU/RSV testing.  Fact Sheet for Patients: EntrepreneurPulse.com.au  Fact Sheet for Healthcare  Providers: IncredibleEmployment.be  This test is not yet approved or cleared by the [place] FDA and has been authorized for detection and/or diagnosis of SARS-CoV-2 by FDA under an Emergency Use Authorization (EUA). This EUA will remain in effect (meaning this test can be used) for the duration of the COVID-19 declaration under Section 564(b)(1) of the Act, 21 U.S.C. section 360bbb-3(b)(1), unless the authorization is terminated or revoked.  Performed at [facility], [place] [address] [place], [place] [address]   Blood culture (routine x 2)     Status: Abnormal   Collection Time: 11/21/21  6:28 PM   Specimen: Site Not Specified; Blood  Result Value Ref Range Status   Specimen Description SITE NOT SPECIFIED  Final   Special Requests   Final    BOTTLES DRAWN AEROBIC AND ANAEROBIC Blood Culture results may not be optimal due to an excessive volume of blood received in culture bottles   Culture  Setup Time   Final    GRAM POSITIVE COCCI IN CLUSTERS IN BOTH AEROBIC AND ANAEROBIC BOTTLES CRITICAL RESULT CALLED TO, READ BACK BY AND VERIFIED WITH: Keith Rake PHARMD 1340 11/22/21 A BROWNING Performed at [facility], [place] [address], [place], [place] [address]    Culture METHICILLIN RESISTANT STAPHYLOCOCCUS AUREUS (A)  Final   Report Status 11/24/2021 FINAL  Final   Organism ID, Bacteria METHICILLIN RESISTANT STAPHYLOCOCCUS AUREUS  Final      Susceptibility   Methicillin resistant staphylococcus aureus - MIC*    CIPROFLOXACIN >=8 RESISTANT Resistant     ERYTHROMYCIN >=8 RESISTANT Resistant     GENTAMICIN <=0.5 SENSITIVE Sensitive     OXACILLIN >=4 RESISTANT Resistant     TETRACYCLINE 2 SENSITIVE Sensitive     VANCOMYCIN <=0.5 SENSITIVE Sensitive     TRIMETH/SULFA <=10 SENSITIVE Sensitive     CLINDAMYCIN >=8 RESISTANT Resistant     RIFAMPIN <=0.5 SENSITIVE Sensitive     Inducible Clindamycin NEGATIVE Sensitive     * METHICILLIN RESISTANT STAPHYLOCOCCUS  AUREUS  Blood Culture ID Panel (Reflexed)     Status: Abnormal   Collection Time: 11/21/21  6:28 PM  Result Value Ref Range Status   Enterococcus faecalis NOT DETECTED NOT DETECTED Final   Enterococcus Faecium NOT DETECTED NOT DETECTED Final   Listeria monocytogenes NOT DETECTED NOT DETECTED Final   Staphylococcus species DETECTED (A) NOT DETECTED Final    Comment: CRITICAL RESULT CALLED TO, READ BACK BY AND VERIFIED WITH: K PATEL PHARMD 1340 11/22/21 A BROWNING    Staphylococcus aureus (BCID)  DETECTED (A) NOT DETECTED Final    Comment: Methicillin (oxacillin)-resistant Staphylococcus aureus (MRSA). MRSA is predictably resistant to beta-lactam antibiotics (except ceftaroline). Preferred therapy is vancomycin unless clinically contraindicated. Patient requires contact precautions if  hospitalized. CRITICAL RESULT CALLED TO, READ BACK BY AND VERIFIED WITH: K PATEL PHARMD 1340 11/22/21 A BROWNING    Staphylococcus epidermidis NOT DETECTED NOT DETECTED Final   Staphylococcus lugdunensis NOT DETECTED NOT DETECTED Final   Streptococcus species NOT DETECTED NOT DETECTED Final   Streptococcus agalactiae NOT DETECTED NOT DETECTED Final   Streptococcus pneumoniae NOT DETECTED NOT DETECTED Final   Streptococcus pyogenes NOT DETECTED NOT DETECTED Final   A.calcoaceticus-baumannii NOT DETECTED NOT DETECTED Final   Bacteroides fragilis NOT DETECTED NOT DETECTED Final   Enterobacterales NOT DETECTED NOT DETECTED Final   Enterobacter cloacae complex NOT DETECTED NOT DETECTED Final   Escherichia coli NOT DETECTED NOT DETECTED Final   Klebsiella aerogenes NOT DETECTED NOT DETECTED Final   Klebsiella oxytoca NOT DETECTED NOT DETECTED Final   Klebsiella pneumoniae NOT DETECTED NOT DETECTED Final   Proteus species NOT DETECTED NOT DETECTED Final   Salmonella species NOT DETECTED NOT DETECTED Final   Serratia marcescens NOT DETECTED NOT DETECTED Final   Haemophilus influenzae NOT DETECTED NOT DETECTED  Final   Neisseria meningitidis NOT DETECTED NOT DETECTED Final   Pseudomonas aeruginosa NOT DETECTED NOT DETECTED Final   Stenotrophomonas maltophilia NOT DETECTED NOT DETECTED Final   Candida albicans NOT DETECTED NOT DETECTED Final   Candida auris NOT DETECTED NOT DETECTED Final   Candida glabrata NOT DETECTED NOT DETECTED Final   Candida krusei NOT DETECTED NOT DETECTED Final   Candida parapsilosis NOT DETECTED NOT DETECTED Final   Candida tropicalis NOT DETECTED NOT DETECTED Final   Cryptococcus neoformans/gattii NOT DETECTED NOT DETECTED Final   Meth resistant mecA/C and MREJ DETECTED (A) NOT DETECTED Final    Comment: CRITICAL RESULT CALLED TO, READ BACK BY AND VERIFIED WITH: K PATEL PHARMD 1340 11/22/21 A BROWNING Performed at [facility], [address], [place], [place] [address]   Surgical pcr screen     Status: None   Collection Time: 11/23/21 12:37 AM   Specimen: Nasal Mucosa; Nasal Swab  Result Value Ref Range Status   MRSA, PCR NEGATIVE NEGATIVE Final   Staphylococcus aureus NEGATIVE NEGATIVE Final    Comment: (NOTE) The Xpert SA Assay (FDA approved for NASAL specimens in patients 54 years of age and older), is one component of a comprehensive surveillance program. It is not intended to diagnose infection nor to guide or monitor treatment. Performed at [facility], [place] [address], [place], [place] [address]   Aerobic/Anaerobic Culture w Gram Stain (surgical/deep wound)     Status: None (Preliminary result)   Collection Time: 11/23/21 12:13 PM   Specimen: PATH Other; Body Fluid  Result Value Ref Range Status   Specimen Description FLUID  Final   Special Requests LEFT FOOT WOUND SPEC A  Final   Gram Stain NO RBC SEEN NO ORGANISMS SEEN   Final   Culture   Final    NO GROWTH 4 DAYS NO ANAEROBES ISOLATED; CULTURE IN PROGRESS FOR 5 DAYS Performed at [facility], [address], [place], [place] [address]    Report Status PENDING  Incomplete   Aerobic/Anaerobic Culture w Gram Stain (surgical/deep wound)     Status: None   Collection Time: 11/23/21  1:34 PM   Specimen: PATH Other; Body Fluid  Result Value Ref Range  Status   Specimen Description TISSUE  Final   Special Requests LEFT FIRST METATARSAL SPEC B  Final   Gram Stain NO WBC SEEN NO ORGANISMS SEEN   Final   Culture   Final    RARE ENTEROCOCCUS FAECALIS NO ANAEROBES ISOLATED Performed at [facility], [address], [place], [place] [address]    Report Status 11/28/2021 FINAL  Final   Organism ID, Bacteria ENTEROCOCCUS FAECALIS  Final      Susceptibility   Enterococcus faecalis - MIC*    AMPICILLIN <=2 SENSITIVE Sensitive     VANCOMYCIN 1 SENSITIVE Sensitive     GENTAMICIN SYNERGY SENSITIVE Sensitive     * RARE ENTEROCOCCUS FAECALIS  Culture, blood (routine x 2)     Status: None (Preliminary result)   Collection Time: 11/24/21 10:37 AM   Specimen: BLOOD  Result Value Ref Range Status   Specimen Description BLOOD RIGHT ANTECUBITAL  Final   Special Requests   Final    BOTTLES DRAWN AEROBIC ONLY Blood Culture adequate volume   Culture   Final    NO GROWTH 4 DAYS Performed at [facility], [place] [address], [place], [place] [address]    Report Status PENDING  Incomplete  Culture, blood (routine x 2)     Status: None (Preliminary result)   Collection Time: 11/24/21 10:53 AM   Specimen: BLOOD LEFT HAND  Result Value Ref Range Status   Specimen Description BLOOD LEFT HAND  Final   Special Requests   Final    BOTTLES DRAWN AEROBIC ONLY Blood Culture results may not be optimal due to an inadequate volume of blood received in culture bottles   Culture   Final    NO GROWTH 4 DAYS Performed at [facility], [place] [address] [place], [place] [address]    Report Status PENDING  Incomplete     Labs: Basic Metabolic Panel: Recent Labs  Lab 11/23/21 0439 11/24/21 0230 11/26/21 0230 11/27/21 0451 11/28/21 0139  NA 134* 132* 134* 134* 134*  K  4.1 4.4 3.7 3.8 3.3*  CL 100 99 102 102 102  CO2 '24 25 23 24 22  ' GLUCOSE 163* 209* 240* 215* 169*  BUN '12 17 10 8 8  ' CREATININE 1.04* 1.17* 0.87 0.79 0.81  CALCIUM 9.4 9.0 9.0 8.8* 8.7*   Liver Function Tests: Recent Labs  Lab 11/21/21 1828  AST 21  ALT 19  ALKPHOS 54  BILITOT 0.7  PROT 8.2*  ALBUMIN 3.1*   Recent Labs  Lab 11/21/21 1828  LIPASE 25   No results for input(s): AMMONIA in the last 168 hours. CBC: Recent Labs  Lab 11/21/21 1828 11/23/21 0439 11/24/21 0230 11/26/21 0230 11/27/21 0451 11/28/21 0139  WBC 18.1* 17.2* 12.9* 10.9* 12.2* 12.8*  NEUTROABS 15.3*  --   --   --   --   --   HGB 10.7* 10.0* 8.5* 7.6* 8.1* 8.3*  HCT 34.8* 33.0* 27.4* 23.6* 25.5* 25.0*  MCV 86.1 86.2 85.9 83.4 83.6 83.6  PLT 475* 446* 379 386 487* 506*   Cardiac Enzymes: Recent Labs  Lab 11/23/21 0439  CKTOTAL 16*   BNP: BNP (last 3 results) Recent Labs    12/18/20 0110 07/20/21 0104  BNP 113.2* 86.1    ProBNP (last 3 results) Recent Labs    01/12/21 1013  PROBNP 246    CBG: Recent Labs  Lab 11/27/21 1100 11/27/21 1635 11/27/21 2124 11/28/21 0602 11/28/21 1108  GLUCAP 184* 149* 227*  155* 197*       Signed:  Domenic Polite MD.  [facility] 11/28/2021, 1:30 PM

## 2021-11-28 NOTE — TOC Transition Note (Addendum)
Transition of Care [facility]) - CM/SW Discharge Note   Patient Details  Name: Jasmine Richardson MRN: [id number] Date of Birth: Mar 14, 1961  [facility] CM/SW Contact:  Zenon Mayo, RN Phone Number: 11/28/2021, 3:00 PM   Clinical Narrative:    Patient is for dc today after she receives the picc line, she states her spouse will get off work at 3 pm and then will be on his way here to the hospital for teaching, Carolynn Sayers with [employer] will supply the iv medication and [facility] will provide the [facility] for picc care and lab draws.   Final next level of care: [place] Barriers to Discharge: No Barriers Identified   Patient Goals and CMS Choice Patient states their goals for this hospitalization and ongoing recovery are:: return home with [facility] CMS Medicare.gov Compare Post Acute Care list provided to:: Patient Choice offered to / list presented to : Patient  Discharge Placement                       Discharge Plan and Services In-house Referral: NA Discharge Planning Services: CM Consult Post Acute Care Choice: Home Health            DME Agency: NA       HH Arranged: RN, IV Antibiotics HH Agency:  [employer]chant [employer] officer) Date HH Agency Contacted: 11/28/21 Time [place]: 1455 Representative spoke with at [place]: [place] ([place]) Interventions     Readmission Risk Interventions Readmission Risk Prevention Plan 11/28/2021  Transportation Screening Complete  PCP or Specialist Appt within 3-5 Days Complete  HRI or [place] Complete  Social Work Consult for [place] Planning/Counseling Complete  Palliative Care Screening Not Applicable  Medication Review Press photographer) Complete  Some recent data might be hidden

## 2021-11-28 NOTE — Progress Notes (Signed)
PHARMACY CONSULT NOTE FOR:  OUTPATIENT  PARENTERAL ANTIBIOTIC THERAPY (OPAT)  Indication: MRSA bacteremia Regimen: daptomycin 650 mg IV daily End date: 01/04/2022  IV antibiotic discharge orders are pended. To discharging provider:  please sign these orders via discharge navigator,  Select New Orders & click on the button choice - Manage This Unsigned Work.     Thank you for involving pharmacy in this patient's care.  Renold Genta, PharmD, BCPS Clinical Pharmacist Clinical phone for 11/28/2021 until 3p is [phone] 11/28/2021 1:10 PM  **Pharmacist phone directory can be found on [email or web address] listed under [place]**

## 2021-11-28 NOTE — TOC Initial Note (Addendum)
Transition of Care [facility]) - Initial/Assessment Note    Patient Details  Name: Jasmine Richardson MRN: [id number] Date of Birth: [birth year]  Transition of Care [facility] CM/SW Contact:    Zenon Mayo, RN Phone Number: 11/28/2021, 2:56 PM  Clinical Narrative:                 Patient is for dc today after she receives the picc line, she states her spouse will get off work at 3 pm and then will be on his way here to the hospital for teaching, Carolynn Sayers with [facility] will supply the iv medication and [facility] will provide the [facility] for picc care. Patient stated she was fine with [facility] providing the [facility] for her at home.  Expected Discharge Plan: [place] Barriers to Discharge: No Barriers Identified   Patient Goals and CMS Choice Patient states their goals for this hospitalization and ongoing recovery are:: return home with [facility] CMS Medicare.gov Compare Post Acute Care list provided to:: Patient Choice offered to / list presented to : Patient  Expected Discharge Plan and Services Expected Discharge Plan: [place] In-house Referral: NA Discharge Planning Services: CM Consult Post Acute Care Choice: [place] arrangements for the past 2 months: Single Family Home Expected Discharge Date: 11/28/21                 DME Agency: NA       HH Arranged: RN, IV Antibiotics HH Agency:  Merchant navy officer) Date HH Agency Contacted: 11/28/21 Time Luck: 71 Representative spoke with at [place]: [place] Arrangements/Services Living arrangements for the past 2 months: [place] with:: Spouse Patient language and need for interpreter reviewed:: Yes Do you feel safe going back to the place where you live?: Yes      Need for Family Participation in Patient Care: Yes (Comment) Care giver support system in place?: Yes (comment)   Criminal Activity/Legal Involvement Pertinent to Current  Situation/Hospitalization: No - Comment as needed  Activities of Daily Living Home Assistive Devices/Equipment: CBG Meter, Eyeglasses, Walker (specify type), Cane (specify quad or straight) ADL Screening (condition at time of admission) Patient's cognitive ability adequate to safely complete daily activities?: Yes Is the patient deaf or have difficulty hearing?: No Does the patient have difficulty seeing, even when wearing glasses/contacts?: No Does the patient have difficulty concentrating, remembering, or making decisions?: No Patient able to express need for assistance with ADLs?: Yes Does the patient have difficulty dressing or bathing?: No Independently performs ADLs?: Yes (appropriate for developmental age) Does the patient have difficulty walking or climbing stairs?: Yes Weakness of Legs: Left Weakness of Arms/Hands: None  Permission Sought/Granted                  Emotional Assessment Appearance:: Appears stated age Attitude/Demeanor/Rapport: Engaged Affect (typically observed): Appropriate Orientation: : Oriented to Self, Oriented to Place, Oriented to  Time, Oriented to Situation Alcohol / Substance Use: Not Applicable Psych Involvement: No (comment)  Admission diagnosis:  Bacteremia [R78.81] Cellulitis [L03.90] Diabetic foot infection ([place]) [I09.735, L08.9] Patient Active Problem List   Diagnosis Date Noted   Bacteremia 11/22/2021   Diabetic foot infection ([place]) 11/21/2021   Abnormal stress test 10/23/2021   Abnormal EKG 10/23/2021   AKI (acute kidney injury) ([place]) 10/23/2021   Anxiety 10/23/2021   Cellulitis of right breast 10/23/2021   Gangrene ([place]) 10/23/2021   Hematoma 10/23/2021   Hiatal hernia 10/23/2021  Hyperglycemia 10/23/2021   Hyperglycemia due to type 2 diabetes mellitus ([place]) 10/23/2021   Hyponatremia 10/23/2021   Infection of great toe 10/23/2021   Lactic acidosis 10/23/2021   Leukocytosis 10/23/2021   Obesity (BMI 30-39.9) 10/23/2021    Perineal abscess 10/23/2021   Cellulitis, wound, post-operative 10/09/2021   Abnormal mammogram of left breast 09/04/2021   Breast wound, right, subsequent encounter 09/04/2021   Demand ischemia [facility])    ICD (implantable cardioverter-defibrillator) in place 05/16/2021   Ventricular tachycardia 12/17/2020   Unstable angina ([place]) 05/22/2020   NSTEMI (non-ST elevated myocardial infarction) ([place]) 05/22/2020   Diabetic foot ulcer ([place]) 02/16/2020   Angina pectoris ([place]) 09/11/2017   Emphysema lung ([place]) 09/11/2017   Chronic diastolic heart failure ([place]) 11/06/2016   Mediastinitis 06/28/2016   Wound, surgical, infected 06/06/2016   S/P CABG (coronary artery bypass graft) 06/03/2016   Severe sepsis ([place]) 06/03/2016   GERD (gastroesophageal reflux disease) 05/08/2016   Morbid obesity ([place]) 05/08/2016   Type 2 diabetes mellitus with foot ulcer ([place]) 05/08/2016   Sinus tachycardia 05/08/2016   Acute chest pain 05/08/2016   Burping 05/08/2016   Tobacco use disorder 04/20/2016   Coronary artery disease involving native coronary artery of native heart with angina pectoris ([place]) 09/12/2015   Essential hypertension 09/12/2015   Hyperlipidemia 09/12/2015   PCP:  Nicholos Johns, MD Pharmacy:   [place] [address] [place] [address] Phone: [phone] Fax: [phone]     Social Determinants of Health (SDOH) Interventions    Readmission Risk Interventions Readmission Risk Prevention Plan 11/28/2021  Transportation Screening Complete  PCP or Specialist Appt within 3-5 Days Complete  HRI or Home Care Consult Complete  Social Work Consult for [place] Planning/Counseling Complete  Palliative Care Screening Not Applicable  Medication Review Press photographer) Complete  Some recent data might be hidden

## 2021-11-28 NOTE — Progress Notes (Signed)
Mobility Specialist Progress Note:   11/28/21 1104  Mobility  Activity Ambulated in room  Level of Assistance Standby assist, set-up cues, supervision of patient - no hands on  Assistive Device Front wheel walker  Distance Ambulated (ft) 40 ft  Mobility Ambulated with assistance in room  Mobility Response Tolerated well  Mobility performed by Mobility specialist  Bed Position Chair  $Mobility charge 1 Mobility   Pt received in bed willing to participate in mobility. Pt ambulated to bathroom and requested we ambulate further later. Pt left in chair with call bell in reach and all needs met. Will follow up later for further ambulation.   [facility] Public librarian Phone [phone] Secondary Phone [phone]

## 2021-11-28 NOTE — Plan of Care (Signed)

## 2021-11-29 ENCOUNTER — Encounter (HOSPITAL_COMMUNITY): Payer: Self-pay | Admitting: Internal Medicine

## 2021-11-29 ENCOUNTER — Encounter: Payer: Managed Care, Other (non HMO) | Admitting: Sports Medicine

## 2021-11-29 LAB — CULTURE, BLOOD (ROUTINE X 2)
Culture: NO GROWTH
Culture: NO GROWTH
Special Requests: ADEQUATE

## 2021-12-06 ENCOUNTER — Ambulatory Visit (INDEPENDENT_AMBULATORY_CARE_PROVIDER_SITE_OTHER): Payer: Managed Care, Other (non HMO) | Admitting: Sports Medicine

## 2021-12-06 DIAGNOSIS — Z89432 Acquired absence of left foot: Secondary | ICD-10-CM

## 2021-12-06 DIAGNOSIS — E1165 Type 2 diabetes mellitus with hyperglycemia: Secondary | ICD-10-CM

## 2021-12-06 DIAGNOSIS — L97522 Non-pressure chronic ulcer of other part of left foot with fat layer exposed: Secondary | ICD-10-CM

## 2021-12-06 DIAGNOSIS — T8789 Other complications of amputation stump: Secondary | ICD-10-CM

## 2021-12-06 DIAGNOSIS — Z9889 Other specified postprocedural states: Secondary | ICD-10-CM

## 2021-12-06 DIAGNOSIS — T8189XA Other complications of procedures, not elsewhere classified, initial encounter: Secondary | ICD-10-CM

## 2021-12-06 NOTE — Progress Notes (Signed)
Subjective: Jasmine Richardson is a 60 y.o. female patient seen today in office for POV #1 DOS 11/23/2021, S/P left transmetatarsal amputation.  Patient admits some pain and swelling and is assisted by husband this visit who states that he did change the dressing twice and apply honey to the area but has noticed increased drainage.  Patient denies nausea vomiting fever chills or any other constitutional symptoms at this time.  No other issues noted.   Patient Active Problem List   Diagnosis Date Noted   Bacteremia 11/22/2021   Diabetic foot infection ([place]) 11/21/2021   Abnormal stress test 10/23/2021   Abnormal EKG 10/23/2021   AKI (acute kidney injury) ([place]) 10/23/2021   Anxiety 10/23/2021   Cellulitis of right breast 10/23/2021   Gangrene ([place]) 10/23/2021   Hematoma 10/23/2021   Hiatal hernia 10/23/2021   Hyperglycemia 10/23/2021   Hyperglycemia due to type 2 diabetes mellitus ([place]) 10/23/2021   Hyponatremia 10/23/2021   Infection of great toe 10/23/2021   Lactic acidosis 10/23/2021   Leukocytosis 10/23/2021   Obesity (BMI 30-39.9) 10/23/2021   Perineal abscess 10/23/2021   Cellulitis, wound, post-operative 10/09/2021   Abnormal mammogram of left breast 09/04/2021   Breast wound, right, subsequent encounter 09/04/2021   Demand ischemia ([place])    ICD (implantable cardioverter-defibrillator) in place 05/16/2021   Ventricular tachycardia 12/17/2020   Unstable angina ([place]) 05/22/2020   NSTEMI (non-ST elevated myocardial infarction) ([place]) 05/22/2020   Diabetic foot ulcer ([place]) 02/16/2020   Angina pectoris ([place]) 09/11/2017   Emphysema lung ([place]) 09/11/2017   Chronic diastolic heart failure ([place]) 11/06/2016   Mediastinitis 06/28/2016   Wound, surgical, infected 06/06/2016   S/P CABG (coronary artery bypass graft) 06/03/2016   Severe sepsis ([place]) 06/03/2016   GERD (gastroesophageal reflux disease) 05/08/2016   Morbid obesity ([place]) 05/08/2016   Type 2 diabetes mellitus with foot ulcer  ([place]) 05/08/2016   Sinus tachycardia 05/08/2016   Acute chest pain 05/08/2016   Burping 05/08/2016   Tobacco use disorder 04/20/2016   Coronary artery disease involving native coronary artery of native heart with angina pectoris ([place]) 09/12/2015   Essential hypertension 09/12/2015   Hyperlipidemia 09/12/2015    Current Outpatient Medications on File Prior to Visit  Medication Sig Dispense Refill   acetaminophen (TYLENOL) 325 MG tablet Take 2 tablets (650 mg total) by mouth every 4 (four) hours as needed for headache or mild pain.     amiodarone (PACERONE) 200 MG tablet Take 1 tablet (200 mg total) by mouth daily. 90 tablet 0   aspirin EC 81 MG tablet Take 81 mg by mouth daily. Swallow whole.     cholecalciferol (VITAMIN D3) 25 MCG (1000 UNIT) tablet Take 1,000 Units by mouth 2 (two) times a week.     clopidogrel (PLAVIX) 75 MG tablet Take 75 mg by mouth daily.     cyanocobalamin 1000 MCG tablet Take 1,000 mcg by mouth daily.     dapagliflozin propanediol (FARXIGA) 10 MG TABS tablet Take 10 mg by mouth daily.     daptomycin (CUBICIN) IVPB Inject 650 mg into the vein daily. Indication:  MRSA bacteremia First Dose: No Last Day of Therapy:  01/04/2022 Labs - Once weekly:  CBC/D, BMP, and CPK Labs - Every other week:  ESR and CRP Method of administration: IV Push Method of administration may be changed at the discretion of home infusion pharmacist based upon assessment of the patient and/or caregiver's ability to self-administer the medication ordered. 38 Units 0   fenofibrate (TRICOR) 145 MG  tablet Take 1 tablet (145 mg total) by mouth daily. 90 tablet 0   furosemide (LASIX) 20 MG tablet Take 20 mg by mouth daily.     HUMALOG KWIKPEN 100 UNIT/ML KwikPen Inject 10-50 Units into the skin at bedtime. Sliding scale     isosorbide mononitrate (IMDUR) 30 MG 24 hr tablet Take 1 tablet (30 mg total) by mouth daily. 30 tablet 0   metFORMIN (GLUCOPHAGE) 500 MG tablet Take 1 tablet (500 mg total) by  mouth 2 (two) times daily. (Patient not taking: Reported on 11/22/2021) 180 tablet 0   metoprolol succinate (TOPROL XL) 25 MG 24 hr tablet Take 1 tablet (25 mg total) by mouth 3 (three) times daily. 90 tablet 0   nitroGLYCERIN (NITROSTAT) 0.4 MG SL tablet Place 1 tablet (0.4 mg total) under the tongue every 5 (five) minutes as needed for chest pain. 270 tablet 12   nystatin (MYCOSTATIN/NYSTOP) powder Apply 1 application topically daily as needed (rash/yeast).     omeprazole (PRILOSEC) 20 MG capsule Take 20 mg by mouth daily.     rosuvastatin (CRESTOR) 20 MG tablet Take 1 tablet (20 mg total) by mouth at bedtime. 90 tablet 0   senna-docusate (SENOKOT-S) 8.6-50 MG tablet Take 1 tablet by mouth 2 (two) times daily. 20 tablet 0   TRESIBA FLEXTOUCH 100 UNIT/ML FlexTouch Pen Inject 30 Units into the skin daily.     valsartan (DIOVAN) 80 MG tablet Take 1 tablet (80 mg total) by mouth 2 (two) times daily. 180 tablet 3   vitamin C (ASCORBIC ACID) 250 MG tablet Take 250 mg by mouth at bedtime.     Vitamin D, Ergocalciferol, (DRISDOL) 1.25 MG (50000 UNIT) CAPS capsule Take 50,000 Units by mouth every Saturday.     No current facility-administered medications on file prior to visit.    Allergies  Allergen Reactions   Chlorhexidine Gluconate Itching    Received CHG bath, began itching, required benadryl   Cat Hair Extract Other (See Comments)    Sneezing, watery eyes.   Tramadol Other (See Comments)    Hallucinations   Vancomycin     Other reaction(s): Anaphylaxis*   Codeine Rash    Objective: There were no vitals filed for this visit.  General: No acute distress, AAOx3  Left foot sutures and staples intact except at the medial amputation stump site where there is a 3 cm length and 1 cm with area of gapping with fatty tissue exposed and clear serous drainage present there is no malodor no significant erythema capillary fill time intact to the amputation stump site no purulence no pain to palpation  to the stump however subjectively husband does report that patient has shooting pains and has had issues with some swelling.  There is trace edema noted to the lower leg likely from dependency.   Assessment and Plan:  Problem List Items Addressed This Visit   None Visit Diagnoses     Foot ulcer, left, with fat layer exposed ([place])    -  Primary   Relevant Orders   WOUND CULTURE   Delayed surgical wound healing of foot amputation stump ([place])       Uncontrolled type 2 diabetes mellitus with hyperglycemia ([place])       History of transmetatarsal amputation of left foot (HCC)       S/P foot surgery, left            -Patient seen and evaluated -Cleanse left foot at area of wound dehiscence there was a  small amount of clear serous drainage which I cultured at today's visit if this wound culture comes back positive we will share this information with infectious disease to determine if there needs to be some additional antibiotics or change with therapy -Applied Iodosorb at the medial area of dehiscence on the amputation stump site wound of the left foot covered with dry sterile dressing  -Orders written for [facility] home [facility] to assist with dressing changes twice weekly however did make patient and husband aware that she may not qualify for home wound care because she is getting PICC line care meanwhile they should continue with dressings every other day to the left foot as above if the home nurse cannot assist -Continue with PICC line antibiotics as directed by infectious disease patient has appointment on 12/20/2021 for follow-up -Advised patient to limit activity to necessity and to remain nonweightbearing on the left amputation stump to prevent further dehiscence -At this time we will hold off on wound VAC therapy for another 2 weeks to decide if she needs the wound VAC depending on how the amputation stump site is healing -Will plan for x-rays and wound care at amputation stump at next office  visit on the left foot.  In the meantime, patient to call office if any issues or problems arise.   Landis Martins, DPM

## 2021-12-12 ENCOUNTER — Telehealth: Payer: Self-pay

## 2021-12-12 ENCOUNTER — Other Ambulatory Visit: Payer: Self-pay | Admitting: Internal Medicine

## 2021-12-12 ENCOUNTER — Ambulatory Visit
Admission: RE | Admit: 2021-12-12 | Discharge: 2021-12-12 | Disposition: A | Discharge status: Home / Self Care | Payer: Managed Care, Other (non HMO) | Source: Ambulatory Visit | Attending: Internal Medicine | Admitting: Internal Medicine

## 2021-12-12 DIAGNOSIS — Z95828 Presence of other vascular implants and grafts: Secondary | ICD-10-CM

## 2021-12-12 NOTE — Telephone Encounter (Signed)
Received call from Jasmine Richardson, home health nurse with Jasmine Richardson, requesting chest xray to evaluate PICC placement.   Jasmine Richardson states she saw the patient yesterday 12/26 and while completing the dressing change she noticed that the PICC line feels "coiled" underneath the skin, approximately 5 or 6 cm. She also reports that there are 2-3 cm of exposed catheter.   Jasmine Richardson reports that the line flushes and there is good blood return.   Tula Nakayama: [place], RN

## 2021-12-12 NOTE — Progress Notes (Signed)
CXR placed for PICC line

## 2021-12-12 NOTE — Telephone Encounter (Signed)
Spoke with patient, notified her that provider has ordered CXR to check PICC placement. Provided her with address for [facility] and relayed that she can go today for the xray. Patient verbalized understanding and has no further questions.   Beryle Flock, RN

## 2021-12-12 NOTE — Addendum Note (Signed)
Addended by: Lucie Leather D on: 12/12/2021 01:31 PM   Modules accepted: Orders

## 2021-12-13 ENCOUNTER — Telehealth: Payer: Self-pay | Admitting: Sports Medicine

## 2021-12-13 NOTE — Telephone Encounter (Signed)
Called patient and gave her the number to [facility] to order supplies

## 2021-12-13 NOTE — Telephone Encounter (Signed)
Patient needs the gauze, ace wrap , long pad with blue line that goes over the gauze , and more of the cream that is to go on her foot.  Who is suppose to order these?

## 2021-12-13 NOTE — Telephone Encounter (Signed)
Per Dr. Candiss Norse she has reviewed the patient CXR and patient picc does need to be readjusted. I have spoke with Anderson Malta with IR, she is going to talk with the doctors in IR and give me a call back to let me know when they can get the patient scheduled. Patient has been advised to not use the picc line at this time and wait to hear from [place] regarding picc line appointment. I have also spoke with Apolonio Schneiders with [facility] to let her know patient's picc line will need to be readjusted.

## 2021-12-13 NOTE — Telephone Encounter (Signed)
I spoke to [place] with IR and per Dr. Dr. Sharen Heck Mir, it is ok for patient to resume using the picc line and picc like is in place correctly. I have advised patient to resume using her picc line and Rachel with [facility] advised as well. See CXR for Dr.  Aurea Graff result note.  Kyeshia Zinn T Brooks Sailors

## 2021-12-14 ENCOUNTER — Encounter: Payer: Managed Care, Other (non HMO) | Admitting: Podiatry

## 2021-12-14 LAB — WOUND CULTURE: Organism ID, Bacteria: NONE SEEN

## 2021-12-19 ENCOUNTER — Ambulatory Visit (INDEPENDENT_AMBULATORY_CARE_PROVIDER_SITE_OTHER): Payer: Managed Care, Other (non HMO)

## 2021-12-19 DIAGNOSIS — Z95810 Presence of automatic (implantable) cardiac defibrillator: Secondary | ICD-10-CM | POA: Diagnosis not present

## 2021-12-19 LAB — CUP PACEART REMOTE DEVICE CHECK
Battery Remaining Longevity: 101 mo
Battery Remaining Percentage: 89 %
Battery Voltage: 3.01 V
Brady Statistic AP VP Percent: 1 %
Brady Statistic AP VS Percent: 3 %
Brady Statistic AS VP Percent: 1 %
Brady Statistic AS VS Percent: 97 %
Brady Statistic RA Percent Paced: 3 %
Brady Statistic RV Percent Paced: 1 %
Date Time Interrogation Session: 20230103010051
HighPow Impedance: 64 Ohm
Implantable Lead Implant Date: 20220103
Implantable Lead Implant Date: 20220103
Implantable Lead Location: 753859
Implantable Lead Location: 753860
Implantable Pulse Generator Implant Date: 20220103
Lead Channel Impedance Value: 360 Ohm
Lead Channel Impedance Value: 430 Ohm
Lead Channel Pacing Threshold Amplitude: 0.75 V
Lead Channel Pacing Threshold Amplitude: 1.25 V
Lead Channel Pacing Threshold Pulse Width: 0.5 ms
Lead Channel Pacing Threshold Pulse Width: 0.5 ms
Lead Channel Sensing Intrinsic Amplitude: 12 mV
Lead Channel Sensing Intrinsic Amplitude: 4.8 mV
Lead Channel Setting Pacing Amplitude: 2 V
Lead Channel Setting Pacing Amplitude: 2.5 V
Lead Channel Setting Pacing Pulse Width: 0.5 ms
Lead Channel Setting Sensing Sensitivity: 0.5 mV
Pulse Gen Serial Number: 111037319

## 2021-12-20 ENCOUNTER — Other Ambulatory Visit: Payer: Self-pay

## 2021-12-20 ENCOUNTER — Ambulatory Visit (INDEPENDENT_AMBULATORY_CARE_PROVIDER_SITE_OTHER): Payer: Managed Care, Other (non HMO)

## 2021-12-20 ENCOUNTER — Ambulatory Visit: Payer: Managed Care, Other (non HMO) | Admitting: Internal Medicine

## 2021-12-20 ENCOUNTER — Encounter: Payer: Self-pay | Admitting: Sports Medicine

## 2021-12-20 ENCOUNTER — Ambulatory Visit (INDEPENDENT_AMBULATORY_CARE_PROVIDER_SITE_OTHER): Payer: Managed Care, Other (non HMO) | Admitting: Sports Medicine

## 2021-12-20 VITALS — Temp 97.1°F

## 2021-12-20 DIAGNOSIS — L97522 Non-pressure chronic ulcer of other part of left foot with fat layer exposed: Secondary | ICD-10-CM | POA: Diagnosis not present

## 2021-12-20 DIAGNOSIS — Z89432 Acquired absence of left foot: Secondary | ICD-10-CM

## 2021-12-20 DIAGNOSIS — E1165 Type 2 diabetes mellitus with hyperglycemia: Secondary | ICD-10-CM

## 2021-12-20 DIAGNOSIS — T8789 Other complications of amputation stump: Secondary | ICD-10-CM

## 2021-12-20 DIAGNOSIS — T8189XA Other complications of procedures, not elsewhere classified, initial encounter: Secondary | ICD-10-CM

## 2021-12-20 DIAGNOSIS — Z9889 Other specified postprocedural states: Secondary | ICD-10-CM

## 2021-12-20 NOTE — Progress Notes (Signed)
Subjective: Chyan Carnero is a 61 y.o. female patient seen today in office for POV #2 DOS 11/23/2021, S/P left transmetatarsal amputation.  Patient reports that her husband has been wrapping her foot every day and using Vaseline on the heel and Iodosorb to the surgical site.  Patient is assisted by husband who reports that things seem to be getting better less drainage at the medial amputation stump site.  Patient denies nausea vomiting fever chills or any other constitutional symptoms at this time.  No other issues noted.   Currently on PICC line antibiotics as managed by infectious disease.  Patient Active Problem List   Diagnosis Date Noted   Bacteremia 11/22/2021   Diabetic foot infection ([place]) 11/21/2021   Abnormal stress test 10/23/2021   Abnormal EKG 10/23/2021   AKI (acute kidney injury) ([place]) 10/23/2021   Anxiety 10/23/2021   Cellulitis of right breast 10/23/2021   Gangrene ([place]) 10/23/2021   Hematoma 10/23/2021   Hiatal hernia 10/23/2021   Hyperglycemia 10/23/2021   Hyperglycemia due to type 2 diabetes mellitus ([place]) 10/23/2021   Hyponatremia 10/23/2021   Infection of great toe 10/23/2021   Lactic acidosis 10/23/2021   Leukocytosis 10/23/2021   Obesity (BMI 30-39.9) 10/23/2021   Perineal abscess 10/23/2021   Cellulitis, wound, post-operative 10/09/2021   Abnormal mammogram of left breast 09/04/2021   Breast wound, right, subsequent encounter 09/04/2021   Demand ischemia ([place])    ICD (implantable cardioverter-defibrillator) in place 05/16/2021   Ventricular tachycardia 12/17/2020   Unstable angina ([place]) 05/22/2020   NSTEMI (non-ST elevated myocardial infarction) ([place]) 05/22/2020   Diabetic foot ulcer ([place]) 02/16/2020   Angina pectoris ([place]) 09/11/2017   Emphysema lung ([place]) 09/11/2017   Chronic diastolic heart failure ([place]) 11/06/2016   Mediastinitis 06/28/2016   Wound, surgical, infected 06/06/2016   S/P CABG (coronary artery bypass graft) 06/03/2016   Severe sepsis  ([place]) 06/03/2016   GERD (gastroesophageal reflux disease) 05/08/2016   Morbid obesity ([place]) 05/08/2016   Type 2 diabetes mellitus with foot ulcer ([place]) 05/08/2016   Sinus tachycardia 05/08/2016   Acute chest pain 05/08/2016   Burping 05/08/2016   Tobacco use disorder 04/20/2016   Coronary artery disease involving native coronary artery of native heart with angina pectoris ([place]) 09/12/2015   Essential hypertension 09/12/2015   Hyperlipidemia 09/12/2015    Current Outpatient Medications on File Prior to Visit  Medication Sig Dispense Refill   acetaminophen (TYLENOL) 325 MG tablet Take 2 tablets (650 mg total) by mouth every 4 (four) hours as needed for headache or mild pain.     amiodarone (PACERONE) 200 MG tablet Take 1 tablet (200 mg total) by mouth daily. 90 tablet 0   aspirin EC 81 MG tablet Take 81 mg by mouth daily. Swallow whole.     cholecalciferol (VITAMIN D3) 25 MCG (1000 UNIT) tablet Take 1,000 Units by mouth 2 (two) times a week.     clopidogrel (PLAVIX) 75 MG tablet Take 75 mg by mouth daily.     cyanocobalamin 1000 MCG tablet Take 1,000 mcg by mouth daily.     dapagliflozin propanediol (FARXIGA) 10 MG TABS tablet Take 10 mg by mouth daily.     daptomycin (CUBICIN) IVPB Inject 650 mg into the vein daily. Indication:  MRSA bacteremia First Dose: No Last Day of Therapy:  01/04/2022 Labs - Once weekly:  CBC/D, BMP, and CPK Labs - Every other week:  ESR and CRP Method of administration: IV Push Method of administration may be changed at the discretion of home  infusion pharmacist based upon assessment of the patient and/or caregiver's ability to self-administer the medication ordered. 38 Units 0   fenofibrate (TRICOR) 145 MG tablet Take 1 tablet (145 mg total) by mouth daily. 90 tablet 0   furosemide (LASIX) 20 MG tablet Take 20 mg by mouth daily.     HUMALOG KWIKPEN 100 UNIT/ML KwikPen Inject 10-50 Units into the skin at bedtime. Sliding scale     isosorbide mononitrate (IMDUR)  30 MG 24 hr tablet Take 1 tablet (30 mg total) by mouth daily. 30 tablet 0   metFORMIN (GLUCOPHAGE) 500 MG tablet Take 1 tablet (500 mg total) by mouth 2 (two) times daily. (Patient not taking: Reported on 11/22/2021) 180 tablet 0   metoprolol succinate (TOPROL XL) 25 MG 24 hr tablet Take 1 tablet (25 mg total) by mouth 3 (three) times daily. 90 tablet 0   nitroGLYCERIN (NITROSTAT) 0.4 MG SL tablet Place 1 tablet (0.4 mg total) under the tongue every 5 (five) minutes as needed for chest pain. 270 tablet 12   nystatin (MYCOSTATIN/NYSTOP) powder Apply 1 application topically daily as needed (rash/yeast).     omeprazole (PRILOSEC) 20 MG capsule Take 20 mg by mouth daily.     rosuvastatin (CRESTOR) 20 MG tablet Take 1 tablet (20 mg total) by mouth at bedtime. 90 tablet 0   senna-docusate (SENOKOT-S) 8.6-50 MG tablet Take 1 tablet by mouth 2 (two) times daily. 20 tablet 0   TRESIBA FLEXTOUCH 100 UNIT/ML FlexTouch Pen Inject 30 Units into the skin daily.     valsartan (DIOVAN) 80 MG tablet Take 1 tablet (80 mg total) by mouth 2 (two) times daily. 180 tablet 3   vitamin C (ASCORBIC ACID) 250 MG tablet Take 250 mg by mouth at bedtime.     Vitamin D, Ergocalciferol, (DRISDOL) 1.25 MG (50000 UNIT) CAPS capsule Take 50,000 Units by mouth every Saturday.     No current facility-administered medications on file prior to visit.    Allergies  Allergen Reactions   Chlorhexidine Gluconate Itching    Received CHG bath, began itching, required benadryl   Cat Hair Extract Other (See Comments)    Sneezing, watery eyes.   Tramadol Other (See Comments)    Hallucinations   Vancomycin     Other reaction(s): Anaphylaxis*   Codeine Rash    Objective: There were no vitals filed for this visit.  General: No acute distress, AAOx3  Left foot sutures and staples intact except at the medial amputation stump site where there is a approximate 2.5 cm length and 1 cm width area of gapping with fatty tissue exposed and  minimal clear drainage present there is no malodor no significant erythema capillary fill time intact to the amputation stump site no purulence no significant warmth very minimal swelling no malodor no other acute signs or symptoms of infection.  No pain to palpation to foot or calf.  Assessment and Plan:  Problem List Items Addressed This Visit   None Visit Diagnoses     Foot ulcer, left, with fat layer exposed ([place])    -  Primary   Relevant Orders   DG Foot Complete Left (Completed)   Delayed surgical wound healing of foot amputation stump ([place])       Uncontrolled type 2 diabetes mellitus with hyperglycemia ([place])       History of transmetatarsal amputation of left foot (HCC)       S/P foot surgery, left            -  Patient seen and evaluated -X-rays reviewed consistent with amputation status with mild reactive osteitis at the first metatarsal stump -Cleanse left foot at area of wound dehiscence and mechanically debrided area of fatty tissue using a sterile tissue nipper and removed a few staples at the amputation stump site -Applied Steri-Strips at the area of gapping to bring the skin edges closer together to assist with healing and applied Medihoney to the rest of the surgical stump site incision covered with dry dressing and advised husband to do the same daily and to replace Steri-Strips if they lift off or come off with dressing change -Continue with home nursing and PICC line care via [facility]  -Continue with PICC line antibiotics as directed by infectious disease patient has appointment on 12/22/2021 at this time we will hold off on ordering a repeat MRI because the stump site does not look infected I have shared previous culture results with infectious disease and at this time they will continue with daptomycin since there is no acute clinical concern but I did advise patient to eat yogurt or take probiotic while on antibiotics; patient elects to try to eat  yogurt -Advised patient to limit activity to necessity and to remain nonweightbearing on the left amputation stump to prevent further dehiscence; knee scooter ordered -Will plan for continued wound care at amputation stump at next office visit on the left foot.  In the meantime, patient to call office if any issues or problems arise.   Landis Martins, DPM

## 2021-12-21 ENCOUNTER — Encounter: Payer: Managed Care, Other (non HMO) | Admitting: Podiatry

## 2021-12-22 ENCOUNTER — Ambulatory Visit (INDEPENDENT_AMBULATORY_CARE_PROVIDER_SITE_OTHER): Payer: Managed Care, Other (non HMO) | Admitting: Internal Medicine

## 2021-12-22 ENCOUNTER — Encounter: Payer: Managed Care, Other (non HMO) | Admitting: Sports Medicine

## 2021-12-22 ENCOUNTER — Other Ambulatory Visit: Payer: Self-pay

## 2021-12-22 ENCOUNTER — Encounter: Payer: Self-pay | Admitting: Internal Medicine

## 2021-12-22 VITALS — BP 154/82 | HR 85 | Temp 97.8°F

## 2021-12-22 DIAGNOSIS — M86172 Other acute osteomyelitis, left ankle and foot: Secondary | ICD-10-CM

## 2021-12-22 NOTE — Progress Notes (Signed)
Communicated with Dr. Candiss Norse regarding STAT MRI order. According to referral coordinator patient will not be able to have MRI at [facility] today due to office being closed. Also patient's insurance will not cover MRI unless patient is inpatient.   Routine MRI ok per Dr. Candiss Norse patient made aware that he will be able to go for imaging once [place] [facility] reopening on Monday. Eugenia Mcalpine

## 2021-12-22 NOTE — Progress Notes (Signed)
Patient Active Problem List   Diagnosis Date Noted   Bacteremia 11/22/2021   Diabetic foot infection ([place]) 11/21/2021   Abnormal stress test 10/23/2021   Abnormal EKG 10/23/2021   AKI (acute kidney injury) ([place]) 10/23/2021   Anxiety 10/23/2021   Cellulitis of right breast 10/23/2021   Gangrene ([place]) 10/23/2021   Hematoma 10/23/2021   Hiatal hernia 10/23/2021   Hyperglycemia 10/23/2021   Hyperglycemia due to type 2 diabetes mellitus ([place]) 10/23/2021   Hyponatremia 10/23/2021   Infection of great toe 10/23/2021   Lactic acidosis 10/23/2021   Leukocytosis 10/23/2021   Obesity (BMI 30-39.9) 10/23/2021   Perineal abscess 10/23/2021   Cellulitis, wound, post-operative 10/09/2021   Abnormal mammogram of left breast 09/04/2021   Breast wound, right, subsequent encounter 09/04/2021   Demand ischemia ([place])    ICD (implantable cardioverter-defibrillator) in place 05/16/2021   Ventricular tachycardia 12/17/2020   Unstable angina ([place]) 05/22/2020   NSTEMI (non-ST elevated myocardial infarction) ([place]) 05/22/2020   Diabetic foot ulcer ([place]) 02/16/2020   Angina pectoris ([place]) 09/11/2017   Emphysema lung ([place]) 09/11/2017   Chronic diastolic heart failure ([place]) 11/06/2016   Mediastinitis 06/28/2016   Wound, surgical, infected 06/06/2016   S/P CABG (coronary artery bypass graft) 06/03/2016   Severe sepsis ([place]) 06/03/2016   GERD (gastroesophageal reflux disease) 05/08/2016   Morbid obesity ([place]) 05/08/2016   Type 2 diabetes mellitus with foot ulcer ([place]) 05/08/2016   Sinus tachycardia 05/08/2016   Acute chest pain 05/08/2016   Burping 05/08/2016   Tobacco use disorder 04/20/2016   Coronary artery disease involving native coronary artery of native heart with angina pectoris ([place]) 09/12/2015   Essential hypertension 09/12/2015   Hyperlipidemia 09/12/2015    Patient's Medications  New Prescriptions   No medications on file  Previous Medications   ACETAMINOPHEN (TYLENOL)  325 MG TABLET    Take 2 tablets (650 mg total) by mouth every 4 (four) hours as needed for headache or mild pain.   AMIODARONE (PACERONE) 200 MG TABLET    Take 1 tablet (200 mg total) by mouth daily.   ASPIRIN EC 81 MG TABLET    Take 81 mg by mouth daily. Swallow whole.   CHOLECALCIFEROL (VITAMIN D3) 25 MCG (1000 UNIT) TABLET    Take 1,000 Units by mouth 2 (two) times a week.   CLOPIDOGREL (PLAVIX) 75 MG TABLET    Take 75 mg by mouth daily.   CYANOCOBALAMIN 1000 MCG TABLET    Take 1,000 mcg by mouth daily.   DAPAGLIFLOZIN PROPANEDIOL (FARXIGA) 10 MG TABS TABLET    Take 10 mg by mouth daily.   DAPTOMYCIN (CUBICIN) IVPB    Inject 650 mg into the vein daily. Indication:  MRSA bacteremia First Dose: No Last Day of Therapy:  01/04/2022 Labs - Once weekly:  CBC/D, BMP, and CPK Labs - Every other week:  ESR and CRP Method of administration: IV Push Method of administration may be changed at the discretion of home infusion pharmacist based upon assessment of the patient and/or caregiver's ability to self-administer the medication ordered.   FENOFIBRATE (TRICOR) 145 MG TABLET    Take 1 tablet (145 mg total) by mouth daily.   FUROSEMIDE (LASIX) 20 MG TABLET    Take 20 mg by mouth daily.   HUMALOG KWIKPEN 100 UNIT/ML KWIKPEN    Inject 10-50 Units into the skin at bedtime. Sliding scale   ISOSORBIDE MONONITRATE (IMDUR) 30 MG 24 HR TABLET    Take  1 tablet (30 mg total) by mouth daily.   METFORMIN (GLUCOPHAGE) 500 MG TABLET    Take 1 tablet (500 mg total) by mouth 2 (two) times daily.   METOPROLOL SUCCINATE (TOPROL XL) 25 MG 24 HR TABLET    Take 1 tablet (25 mg total) by mouth 3 (three) times daily.   NITROGLYCERIN (NITROSTAT) 0.4 MG SL TABLET    Place 1 tablet (0.4 mg total) under the tongue every 5 (five) minutes as needed for chest pain.   NYSTATIN (MYCOSTATIN/NYSTOP) POWDER    Apply 1 application topically daily as needed (rash/yeast).   OMEPRAZOLE (PRILOSEC) 20 MG CAPSULE    Take 20 mg by mouth daily.    ROSUVASTATIN (CRESTOR) 20 MG TABLET    Take 1 tablet (20 mg total) by mouth at bedtime.   SENNA-DOCUSATE (SENOKOT-S) 8.6-50 MG TABLET    Take 1 tablet by mouth 2 (two) times daily.   TRESIBA FLEXTOUCH 100 UNIT/ML FLEXTOUCH PEN    Inject 30 Units into the skin daily.   VALSARTAN (DIOVAN) 80 MG TABLET    Take 1 tablet (80 mg total) by mouth 2 (two) times daily.   VITAMIN C (ASCORBIC ACID) 250 MG TABLET    Take 250 mg by mouth at bedtime.   VITAMIN D, ERGOCALCIFEROL, (DRISDOL) 1.25 MG (50000 UNIT) CAPS CAPSULE    Take 50,000 Units by mouth every Saturday.  Modified Medications   No medications on file  Discontinued Medications   No medications on file    Subjective: 61 year old female with uncontrolled diabetes with A1c 12.8, diabetic left foot wounds status post I&D on 10/10/21 followed by podiatry, V. tach status post ICD implantation admitted for sepsis secondary to left foot wound.  Found to have MRSA bacteremia due to diabetic left foot wound.  MRI of left foot on 11/22/21 showed suspicion for osteomyelitis of 1st/2nd/3rd metatarsal with diffuse cellulitis and myofascitis. Pt went to OR on 11/23/21 for left transmetatarsal amputation with Cx+ Enterococcus faecalis. TEE showed no vegetation. She also, had right breast erythema that had been going on for months SP debridement in Novmber  2022, Ultrasound inpatient showed  no abscess.  Pt discharged on daptomycin to complete 6 weeks of antibiotics for osteomyelitis from OR.    Today: Pt reports she has continued drainage, slightly improved over the last few days. Denies fever, chills, N/V/D.  Reports right breast erythema is improving.  Review of Systems: Review of Systems  All other systems reviewed and are negative.  Past Medical History:  Diagnosis Date   Acute chest pain 05/08/2016   Angina pectoris ([place]) 09/11/2017   Burping 05/08/2016   Cancer ([place])    cervical - hysterectomy   Chronic diastolic heart failure ([place]) 11/06/2016    Coronary artery disease involving native coronary artery of native heart with angina pectoris ([place]) 09/12/2015   Overview:  PCI and stent of RCA 2009, last cath 2012 with medical therapy  CABG May 2017   Emphysema lung ([place]) 09/11/2017   patient not aware of this dx   Essential hypertension 09/12/2015   GERD (gastroesophageal reflux disease) 05/08/2016   Hyperlipidemia 09/12/2015   Mediastinitis 06/28/2016   Morbid obesity ([place]) 05/08/2016   Myocardial infarction [facility])    Peripheral vascular disease ([place])    S/P CABG (coronary artery bypass graft) 06/03/2016   Overview:  The patient underwent sternal reconstruction on 06/21/16 with pec flaps for mediastinitis from a prior CABG in May 2017. On admission, she was critically ill from sepsis and had  altered mental status. She was last seen in clinic on 08/09/16 at which time she was doing well.   Severe sepsis ([place]) 06/03/2016   Sinus tachycardia 05/08/2016   Tobacco use disorder 04/20/2016   Overview:  Quit in May 2017.   Uncontrolled type 2 diabetes mellitus 05/08/2016   Wound, surgical, infected 06/06/2016   Overview:  sternal    Social History   Tobacco Use   Smoking status: Former    Types: Cigarettes    Quit date: 05/2016    Years since quitting: 5.6   Smokeless tobacco: Never  Vaping Use   Vaping Use: Never used  Substance Use Topics   Alcohol use: Yes    Comment: social wine   Drug use: No    Family History  Problem Relation Age of Onset   Hypertension Mother    Hyperlipidemia Mother    Heart attack Father    Heart disease Father    Hypertension Father    Alzheimer's disease Father    Hypertension Brother    Hyperlipidemia Brother     Allergies  Allergen Reactions   Chlorhexidine Gluconate Itching    Received CHG bath, began itching, required benadryl   Cat Hair Extract Other (See Comments)    Sneezing, watery eyes.   Tramadol Other (See Comments)    Hallucinations   Vancomycin     Other reaction(s):  Anaphylaxis*   Codeine Rash    Health Maintenance  Topic Date Due   COVID-19 Vaccine (1) Never done   Pneumococcal Vaccine 67-75 Years old (1 - PCV) Never done   FOOT EXAM  Never done   OPHTHALMOLOGY EXAM  Never done   Hepatitis C Screening  Never done   Zoster Vaccines- Shingrix (1 of 2) Never done   PAP SMEAR-Modifier  Never done   COLONOSCOPY (Pts 45-38yr Insurance coverage will need to be confirmed)  Never done   MAMMOGRAM  Never done   HEMOGLOBIN A1C  05/24/2022   TETANUS/TDAP  11/28/2028   INFLUENZA VACCINE  Completed   HIV Screening  Completed   HPV VACCINES  Aged Out    Objective:  Vitals:   12/22/21 1609  BP: (!) 154/82  Pulse: 85  Temp: 97.8 F (36.6 C)  TempSrc: Oral  SpO2: 99%   There is no height or weight on file to calculate BMI.  Physical Exam Constitutional:      Appearance: Normal appearance.  HENT:     Head: Normocephalic and atraumatic.     Right Ear: Tympanic membrane normal.     Left Ear: Tympanic membrane normal.     Nose: Nose normal.     Mouth/Throat:     Mouth: Mucous membranes are moist.  Eyes:     Extraocular Movements: Extraocular movements intact.     Conjunctiva/sclera: Conjunctivae normal.     Pupils: Pupils are equal, round, and reactive to light.  Cardiovascular:     Rate and Rhythm: Normal rate and regular rhythm.     Heart sounds: No murmur heard.   No friction rub. No gallop.  Pulmonary:     Effort: Pulmonary effort is normal.     Breath sounds: Normal breath sounds.  Abdominal:     General: Abdomen is flat.     Palpations: Abdomen is soft.  Musculoskeletal:        General: Normal range of motion.  Skin:    Comments: Left foot TMA incision is erythematous and edema present. Drainage noted on bandage. Right breast erythema improved,  no scabbing  Neurological:     General: No focal deficit present.     Mental Status: She is alert and oriented to person, place, and time.  Psychiatric:        Mood and Affect: Mood  normal.    Lab Results Lab Results  Component Value Date   WBC 12.8 (H) 11/28/2021   HGB 8.3 (L) 11/28/2021   HCT 25.0 (L) 11/28/2021   MCV 83.6 11/28/2021   PLT 506 (H) 11/28/2021    Lab Results  Component Value Date   CREATININE 0.81 11/28/2021   BUN 8 11/28/2021   NA 134 (L) 11/28/2021   K 3.3 (L) 11/28/2021   CL 102 11/28/2021   CO2 22 11/28/2021    Lab Results  Component Value Date   ALT 19 11/21/2021   AST 21 11/21/2021   ALKPHOS 54 11/21/2021   BILITOT 0.7 11/21/2021    Lab Results  Component Value Date   CHOL 148 07/20/2021   HDL 55 07/20/2021   LDLCALC 56 07/20/2021   LDLDIRECT 91.4 05/22/2020   TRIG 186 (H) 07/20/2021   CHOLHDL 2.7 07/20/2021   No results found for: LABRPR, RPRTITER No results found for: HIV1RNAQUANT, HIV1RNAVL, CD4TABS   Problem List Items Addressed This Visit   None Visit Diagnoses     Acute osteomyelitis of metatarsal bone of left foot ([place])    -  Primary   Relevant Orders   MR FOOT LEFT WO CONTRAST     # MRSA Bacteremia 2/2 diabetic left foot wound SP left transmetatarsal amputation with Cx + Enterococcus faecalis -Continue  daptomycin to complete 6 weeks of antibiotics EOT 01/08/22 -As foot is erythematous around surgical site and continued drainage would recommend repeat imaging to evaluate for residual infection Recommendations: -MRI left foot -Follow-up in 1 month, pending MRI results may need to be soon sooner   Laurice Record, [facility] 12/23/2021, 12:30 AM

## 2021-12-23 ENCOUNTER — Other Ambulatory Visit: Payer: Self-pay | Admitting: Internal Medicine

## 2021-12-25 ENCOUNTER — Telehealth: Payer: Self-pay | Admitting: Sports Medicine

## 2021-12-25 ENCOUNTER — Other Ambulatory Visit: Payer: Managed Care, Other (non HMO)

## 2021-12-25 ENCOUNTER — Other Ambulatory Visit: Payer: Self-pay | Admitting: Internal Medicine

## 2021-12-25 DIAGNOSIS — M86172 Other acute osteomyelitis, left ankle and foot: Secondary | ICD-10-CM

## 2021-12-25 NOTE — Telephone Encounter (Signed)
Pt was seen in hospital by Dr Candiss Norse & they ordered MRI but pt is unable to have test done due to [facility] is full & [facility] will only take 1 [employer] pt every 30 days (?)  pt has appt tomorrow with you but wanted to know if there was anything that could be done to get MRI done.

## 2021-12-26 ENCOUNTER — Other Ambulatory Visit: Payer: Self-pay | Admitting: Internal Medicine

## 2021-12-27 ENCOUNTER — Other Ambulatory Visit: Payer: Self-pay

## 2021-12-27 ENCOUNTER — Encounter: Payer: Self-pay | Admitting: Sports Medicine

## 2021-12-27 ENCOUNTER — Ambulatory Visit (INDEPENDENT_AMBULATORY_CARE_PROVIDER_SITE_OTHER): Payer: Managed Care, Other (non HMO) | Admitting: Sports Medicine

## 2021-12-27 DIAGNOSIS — Z9889 Other specified postprocedural states: Secondary | ICD-10-CM

## 2021-12-27 DIAGNOSIS — T8189XA Other complications of procedures, not elsewhere classified, initial encounter: Secondary | ICD-10-CM

## 2021-12-27 DIAGNOSIS — E1165 Type 2 diabetes mellitus with hyperglycemia: Secondary | ICD-10-CM

## 2021-12-27 DIAGNOSIS — L97522 Non-pressure chronic ulcer of other part of left foot with fat layer exposed: Secondary | ICD-10-CM

## 2021-12-27 DIAGNOSIS — Z89432 Acquired absence of left foot: Secondary | ICD-10-CM

## 2021-12-27 DIAGNOSIS — T8789 Other complications of amputation stump: Secondary | ICD-10-CM

## 2021-12-27 NOTE — Progress Notes (Signed)
Subjective: Jasmine Richardson is a 61 y.o. female patient seen today in office for POV #3 DOS 11/23/2021, S/P left transmetatarsal amputation.  Patient reports that she is very nervous because the infectious disease doctor told her that she needed to get an MRI.  They feel as if the surgical wound is improving and husband states that the redness to the stump is about the same and is not worsening, patient denies nausea vomiting fever chills or any other constitutional symptoms at this time.  No other issues noted.   Currently on PICC line antibiotics as managed by infectious disease with end of treatment estimated of 01/08/2022.  Husband does admit for the last 2 days they have had some difficulty with flushing the PICC line but has not missed any doses of antibiotic.  Patient Active Problem List   Diagnosis Date Noted   Bacteremia 11/22/2021   Diabetic foot infection ([place]) 11/21/2021   Abnormal stress test 10/23/2021   Abnormal EKG 10/23/2021   AKI (acute kidney injury) ([place]) 10/23/2021   Anxiety 10/23/2021   Cellulitis of right breast 10/23/2021   Gangrene ([place]) 10/23/2021   Hematoma 10/23/2021   Hiatal hernia 10/23/2021   Hyperglycemia 10/23/2021   Hyperglycemia due to type 2 diabetes mellitus ([place]) 10/23/2021   Hyponatremia 10/23/2021   Infection of great toe 10/23/2021   Lactic acidosis 10/23/2021   Leukocytosis 10/23/2021   Obesity (BMI 30-39.9) 10/23/2021   Perineal abscess 10/23/2021   Cellulitis, wound, post-operative 10/09/2021   Abnormal mammogram of left breast 09/04/2021   Breast wound, right, subsequent encounter 09/04/2021   Demand ischemia ([place])    ICD (implantable cardioverter-defibrillator) in place 05/16/2021   Ventricular tachycardia 12/17/2020   Unstable angina ([place]) 05/22/2020   NSTEMI (non-ST elevated myocardial infarction) ([place]) 05/22/2020   Diabetic foot ulcer ([place]) 02/16/2020   Angina pectoris ([place]) 09/11/2017   Emphysema lung ([place]) 09/11/2017   Chronic  diastolic heart failure ([place]) 11/06/2016   Mediastinitis 06/28/2016   Wound, surgical, infected 06/06/2016   S/P CABG (coronary artery bypass graft) 06/03/2016   Severe sepsis ([place]) 06/03/2016   GERD (gastroesophageal reflux disease) 05/08/2016   Morbid obesity ([place]) 05/08/2016   Type 2 diabetes mellitus with foot ulcer ([place]) 05/08/2016   Sinus tachycardia 05/08/2016   Acute chest pain 05/08/2016   Burping 05/08/2016   Tobacco use disorder 04/20/2016   Coronary artery disease involving native coronary artery of native heart with angina pectoris ([place]) 09/12/2015   Essential hypertension 09/12/2015   Hyperlipidemia 09/12/2015    Current Outpatient Medications on File Prior to Visit  Medication Sig Dispense Refill   acetaminophen (TYLENOL) 325 MG tablet Take 2 tablets (650 mg total) by mouth every 4 (four) hours as needed for headache or mild pain.     amiodarone (PACERONE) 200 MG tablet Take 1 tablet (200 mg total) by mouth daily. 90 tablet 0   aspirin EC 81 MG tablet Take 81 mg by mouth daily. Swallow whole.     cholecalciferol (VITAMIN D3) 25 MCG (1000 UNIT) tablet Take 1,000 Units by mouth 2 (two) times a week.     clopidogrel (PLAVIX) 75 MG tablet Take 75 mg by mouth daily.     cyanocobalamin 1000 MCG tablet Take 1,000 mcg by mouth daily.     dapagliflozin propanediol (FARXIGA) 10 MG TABS tablet Take 10 mg by mouth daily.     daptomycin (CUBICIN) IVPB Inject 650 mg into the vein daily. Indication:  MRSA bacteremia First Dose: No Last Day of Therapy:  01/04/2022  Labs - Once weekly:  CBC/D, BMP, and CPK Labs - Every other week:  ESR and CRP Method of administration: IV Push Method of administration may be changed at the discretion of home infusion pharmacist based upon assessment of the patient and/or caregiver's ability to self-administer the medication ordered. 38 Units 0   fenofibrate (TRICOR) 145 MG tablet Take 1 tablet (145 mg total) by mouth daily. 90 tablet 0   furosemide  (LASIX) 20 MG tablet Take 20 mg by mouth daily.     HUMALOG KWIKPEN 100 UNIT/ML KwikPen Inject 10-50 Units into the skin at bedtime. Sliding scale     isosorbide mononitrate (IMDUR) 30 MG 24 hr tablet Take 1 tablet (30 mg total) by mouth daily. 30 tablet 0   metFORMIN (GLUCOPHAGE) 500 MG tablet Take 1 tablet (500 mg total) by mouth 2 (two) times daily. (Patient not taking: Reported on 11/22/2021) 180 tablet 0   metoprolol succinate (TOPROL XL) 25 MG 24 hr tablet Take 1 tablet (25 mg total) by mouth 3 (three) times daily. 90 tablet 0   nitroGLYCERIN (NITROSTAT) 0.4 MG SL tablet Place 1 tablet (0.4 mg total) under the tongue every 5 (five) minutes as needed for chest pain. 270 tablet 12   nystatin (MYCOSTATIN/NYSTOP) powder Apply 1 application topically daily as needed (rash/yeast).     omeprazole (PRILOSEC) 20 MG capsule Take 20 mg by mouth daily.     rosuvastatin (CRESTOR) 20 MG tablet Take 1 tablet (20 mg total) by mouth at bedtime. 90 tablet 0   senna-docusate (SENOKOT-S) 8.6-50 MG tablet Take 1 tablet by mouth 2 (two) times daily. 20 tablet 0   TRESIBA FLEXTOUCH 100 UNIT/ML FlexTouch Pen Inject 30 Units into the skin daily.     valsartan (DIOVAN) 80 MG tablet Take 1 tablet (80 mg total) by mouth 2 (two) times daily. 180 tablet 3   vitamin C (ASCORBIC ACID) 250 MG tablet Take 250 mg by mouth at bedtime.     Vitamin D, Ergocalciferol, (DRISDOL) 1.25 MG (50000 UNIT) CAPS capsule Take 50,000 Units by mouth every Saturday.     No current facility-administered medications on file prior to visit.    Allergies  Allergen Reactions   Chlorhexidine Gluconate Itching    Received CHG bath, began itching, required benadryl   Cat Hair Extract Other (See Comments)    Sneezing, watery eyes.   Tramadol Other (See Comments)    Hallucinations   Vancomycin     Other reaction(s): Anaphylaxis*   Codeine Rash    Objective: There were no vitals filed for this visit.  General: No acute distress, AAOx3   Left foot sutures and staples intact except at the medial amputation stump site where there is a approximate 2cm length and 0.8 cm width area of gapping with fatty tissue exposed and minimal clear drainage present once debrided the wound base appears to be healthy and bleeding, there is no malodor, minimal blanchable erythema, no purulence, no significant warmth minimal swelling no other acute signs or symptoms of infection.  Varicose veins noted.  No pain to palpation to foot or calf.  Assessment and Plan:  Problem List Items Addressed This Visit   None Visit Diagnoses     Foot ulcer, left, with fat layer exposed ([place])    -  Primary   Delayed surgical wound healing of foot amputation stump ([place])       Uncontrolled type 2 diabetes mellitus with hyperglycemia ([place])       History of transmetatarsal  amputation of left foot (H[facility])       S/P foot surgery, left            -Patient seen and evaluated -Discussed with patient continued care wound dehiscence amputation stump site -Cleanse left foot at area of wound dehiscence and mechanically debrided area of fatty tissue using a sterile tissue nipper to healthy bleeding -Applied Steri-Strips at the area of gapping to bring the skin edges closer together to assist with healing and applied Medihoney to the rest of the surgical stump site incision covered with dry dressing and advised husband to do the same daily and to replace Steri-Strips if they lift off or come off with dressing changes as previous -Continue with home nursing and PICC line care via [facility]; discussed with patient that there is issues with the PICC line to be sure to let home nurse know -Continue with PICC line antibiotics as directed by infectious disease EOT 01/08/2022; discussed case with Dr. Candiss Norse -Patient is scheduled to get MRI later this month for surveillance however clinically at this time infection does not appear to be of major concern and as discussed with  patient there is always some risk when there is a open wound of reinfection or further limb loss -Advised patient to limit activity to necessity and to remain nonweightbearing on the left amputation stump to prevent further dehiscence; patient to get knee scooter rental for minimum 2 months -Will plan for continued wound care at amputation stump at next office visit on the left foot.  In the meantime, patient to call office if any issues or problems arise.   Landis Martins, DPM

## 2021-12-29 ENCOUNTER — Encounter: Payer: Managed Care, Other (non HMO) | Admitting: Sports Medicine

## 2021-12-29 NOTE — Progress Notes (Signed)
Remote ICD transmission.   

## 2022-01-03 ENCOUNTER — Encounter: Payer: Self-pay | Admitting: Sports Medicine

## 2022-01-03 ENCOUNTER — Ambulatory Visit (INDEPENDENT_AMBULATORY_CARE_PROVIDER_SITE_OTHER): Payer: Managed Care, Other (non HMO) | Admitting: Sports Medicine

## 2022-01-03 ENCOUNTER — Other Ambulatory Visit: Payer: Self-pay

## 2022-01-03 VITALS — Temp 97.2°F

## 2022-01-03 DIAGNOSIS — Z89432 Acquired absence of left foot: Secondary | ICD-10-CM

## 2022-01-03 DIAGNOSIS — Z9889 Other specified postprocedural states: Secondary | ICD-10-CM

## 2022-01-03 DIAGNOSIS — L97522 Non-pressure chronic ulcer of other part of left foot with fat layer exposed: Secondary | ICD-10-CM

## 2022-01-03 DIAGNOSIS — T8189XA Other complications of procedures, not elsewhere classified, initial encounter: Secondary | ICD-10-CM

## 2022-01-03 DIAGNOSIS — E1165 Type 2 diabetes mellitus with hyperglycemia: Secondary | ICD-10-CM

## 2022-01-03 NOTE — Progress Notes (Signed)
Subjective: Jasmine Richardson is a 61 y.o. female patient seen today in office for POV # 4 DOS 11/23/2021, S/P left transmetatarsal amputation.  Patient reports that she thinks she is doing better some drainage but otherwise doing okay.  Denies constitutional symptoms.  Has husband changing dressing daily but husband does admit that he did not change it on yesterday.  Currently on PICC line antibiotics as managed by infectious disease and per husband reports that he has about 2 more vials of antibiotics left.  Patient Active Problem List   Diagnosis Date Noted   Bacteremia 11/22/2021   Diabetic foot infection ([place]) 11/21/2021   Abnormal stress test 10/23/2021   Abnormal EKG 10/23/2021   AKI (acute kidney injury) ([place]) 10/23/2021   Anxiety 10/23/2021   Cellulitis of right breast 10/23/2021   Gangrene ([place]) 10/23/2021   Hematoma 10/23/2021   Hiatal hernia 10/23/2021   Hyperglycemia 10/23/2021   Hyperglycemia due to type 2 diabetes mellitus ([place]) 10/23/2021   Hyponatremia 10/23/2021   Infection of great toe 10/23/2021   Lactic acidosis 10/23/2021   Leukocytosis 10/23/2021   Obesity (BMI 30-39.9) 10/23/2021   Perineal abscess 10/23/2021   Cellulitis, wound, post-operative 10/09/2021   Abnormal mammogram of left breast 09/04/2021   Breast wound, right, subsequent encounter 09/04/2021   Demand ischemia ([place])    ICD (implantable cardioverter-defibrillator) in place 05/16/2021   Ventricular tachycardia 12/17/2020   Unstable angina ([place]) 05/22/2020   NSTEMI (non-ST elevated myocardial infarction) ([place]) 05/22/2020   Diabetic foot ulcer ([place]) 02/16/2020   Angina pectoris ([place]) 09/11/2017   Emphysema lung ([place]) 09/11/2017   Chronic diastolic heart failure ([place]) 11/06/2016   Mediastinitis 06/28/2016   Wound, surgical, infected 06/06/2016   S/P CABG (coronary artery bypass graft) 06/03/2016   Severe sepsis ([place]) 06/03/2016   GERD (gastroesophageal reflux disease) 05/08/2016   Morbid obesity  ([place]) 05/08/2016   Type 2 diabetes mellitus with foot ulcer ([place]) 05/08/2016   Sinus tachycardia 05/08/2016   Acute chest pain 05/08/2016   Burping 05/08/2016   Tobacco use disorder 04/20/2016   Coronary artery disease involving native coronary artery of native heart with angina pectoris ([place]) 09/12/2015   Essential hypertension 09/12/2015   Hyperlipidemia 09/12/2015    Current Outpatient Medications on File Prior to Visit  Medication Sig Dispense Refill   acetaminophen (TYLENOL) 325 MG tablet Take 2 tablets (650 mg total) by mouth every 4 (four) hours as needed for headache or mild pain.     amiodarone (PACERONE) 200 MG tablet Take 1 tablet (200 mg total) by mouth daily. 90 tablet 0   aspirin EC 81 MG tablet Take 81 mg by mouth daily. Swallow whole.     cholecalciferol (VITAMIN D3) 25 MCG (1000 UNIT) tablet Take 1,000 Units by mouth 2 (two) times a week.     clopidogrel (PLAVIX) 75 MG tablet Take 75 mg by mouth daily.     cyanocobalamin 1000 MCG tablet Take 1,000 mcg by mouth daily.     dapagliflozin propanediol (FARXIGA) 10 MG TABS tablet Take 10 mg by mouth daily.     daptomycin (CUBICIN) IVPB Inject 650 mg into the vein daily. Indication:  MRSA bacteremia First Dose: No Last Day of Therapy:  01/04/2022 Labs - Once weekly:  CBC/D, BMP, and CPK Labs - Every other week:  ESR and CRP Method of administration: IV Push Method of administration may be changed at the discretion of home infusion pharmacist based upon assessment of the patient and/or caregiver's ability to self-administer the medication ordered.  38 Units 0   fenofibrate (TRICOR) 145 MG tablet Take 1 tablet (145 mg total) by mouth daily. 90 tablet 0   furosemide (LASIX) 20 MG tablet Take 20 mg by mouth daily.     HUMALOG KWIKPEN 100 UNIT/ML KwikPen Inject 10-50 Units into the skin at bedtime. Sliding scale     isosorbide mononitrate (IMDUR) 30 MG 24 hr tablet Take 1 tablet (30 mg total) by mouth daily. 30 tablet 0   metFORMIN  (GLUCOPHAGE) 500 MG tablet Take 1 tablet (500 mg total) by mouth 2 (two) times daily. (Patient not taking: Reported on 11/22/2021) 180 tablet 0   metoprolol succinate (TOPROL XL) 25 MG 24 hr tablet Take 1 tablet (25 mg total) by mouth 3 (three) times daily. 90 tablet 0   nitroGLYCERIN (NITROSTAT) 0.4 MG SL tablet Place 1 tablet (0.4 mg total) under the tongue every 5 (five) minutes as needed for chest pain. 270 tablet 12   nystatin (MYCOSTATIN/NYSTOP) powder Apply 1 application topically daily as needed (rash/yeast).     omeprazole (PRILOSEC) 20 MG capsule Take 20 mg by mouth daily.     rosuvastatin (CRESTOR) 20 MG tablet Take 1 tablet (20 mg total) by mouth at bedtime. 90 tablet 0   senna-docusate (SENOKOT-S) 8.6-50 MG tablet Take 1 tablet by mouth 2 (two) times daily. 20 tablet 0   TRESIBA FLEXTOUCH 100 UNIT/ML FlexTouch Pen Inject 30 Units into the skin daily.     valsartan (DIOVAN) 80 MG tablet Take 1 tablet (80 mg total) by mouth 2 (two) times daily. 180 tablet 3   vitamin C (ASCORBIC ACID) 250 MG tablet Take 250 mg by mouth at bedtime.     Vitamin D, Ergocalciferol, (DRISDOL) 1.25 MG (50000 UNIT) CAPS capsule Take 50,000 Units by mouth every Saturday.     No current facility-administered medications on file prior to visit.    Allergies  Allergen Reactions   Chlorhexidine Gluconate Itching    Received CHG bath, began itching, required benadryl   Cat Hair Extract Other (See Comments)    Sneezing, watery eyes.   Tramadol Other (See Comments)    Hallucinations   Vancomycin     Other reaction(s): Anaphylaxis*   Codeine Rash    Objective: There were no vitals filed for this visit.  General: No acute distress, AAOx3  Left foot sutures and staples intact except at the medial amputation stump site where there is a approximate 2cm length and 0.8 cm width like before at area of gapping with fatty tissue exposed and minimal clear drainage present once debrided the wound base appears to be  healthy and bleeding, like for there is no malodor, minimal blanchable erythema, no purulence, no significant warmth minimal swelling no other acute signs or symptoms of infection.  Varicose veins noted.  No pain to palpation to foot or calf.  Assessment and Plan:  Problem List Items Addressed This Visit   None Visit Diagnoses     Foot ulcer, left, with fat layer exposed ([place])    -  Primary   Delayed surgical wound healing of foot amputation stump ([place])       Uncontrolled type 2 diabetes mellitus with hyperglycemia ([place])       History of transmetatarsal amputation of left foot ([facility])       S/P foot surgery, left            -Patient seen and evaluated -Discussed with patient continued care wound dehiscence amputation stump site -Cleanse left foot at area  of wound dehiscence and mechanically debrided area of fatty tissue using a sterile tissue nipper to healthy bleeding -Applied 1 Steri-Strips at the area of gapping to bring the skin edges closer together to assist with healing and applied Medihoney to the rest of the surgical stump site incision covered with dry dressing and advised husband to do the same daily and to replace Steri-Strips if they lift off or come off with dressing changes as previous -Continue with home nursing and PICC line care via [facility]; discussed with patient to limit home nurse know or call the infectious disease office directly if concerned about antibiotics -Continue with PICC line antibiotics as directed by infectious disease EOT 01/08/2022 per infectious disease last note and is scheduled for an MRI on 01/08/2022 for surveillance at amputation stump site wound  -Advised patient to limit activity to necessity and to remain nonweightbearing on the left amputation stump to prevent further dehiscence; patient to get knee scooter rental for minimum 2 months; patient still waiting scooter -Will plan for continued wound care at amputation stump at next  office visit on the left foot.  In the meantime, patient to call office if any issues or problems arise.  I also personally called Apolonio Schneiders at [phone] and discussed case with her since she is the home nurse helping to manage the antibiotic and at this time Apolonio Schneiders reports that she could help with doing a wound VAC if it is approved through her insurance for home health to do wound VAC dressing changes.  She informed me that I must contact [facility] at [phone] to put in the order for the wound VAC and to see if the patient's insurance will approve her to have home health for this.  I informed Apolonio Schneiders that we will attempt to do this on next week after patient has MRI.  Landis Martins, DPM

## 2022-01-04 ENCOUNTER — Telehealth: Payer: Self-pay

## 2022-01-04 NOTE — Telephone Encounter (Signed)
Patient is called stating she is scheduled with [facility] for her MRI on 01/08/22, which is also the day she is due to end IV antibiotics. Patient stated she would rather leave the picc line in and continue IV antibiotics until we have the MRI results back to ensure the infection is gone. Please advise.  Tien Aispuro T Brooks Sailors

## 2022-01-05 ENCOUNTER — Encounter: Payer: Managed Care, Other (non HMO) | Admitting: Sports Medicine

## 2022-01-05 NOTE — Telephone Encounter (Signed)
Patient advised that picc will need to be removed after last dose and MRI is to see if surgical intervention is needed. Patient verbalized understanding. I have confirmed with Jeani Hawking at [facility] that they also have pull picc orders for 01/08/22.

## 2022-01-09 ENCOUNTER — Telehealth: Payer: Self-pay

## 2022-01-09 NOTE — Telephone Encounter (Signed)
Verbal orders given to Larkin Ina, pharmacist with [facility] for patient to have PICC line pulled today 01/09/2022. Patient finished IV antibiotics on 01/08/2022. Justin repeated verbal orders back to me and will let patient Jasmine Richardson know.    [place], CMA

## 2022-01-09 NOTE — Telephone Encounter (Signed)
Attempted to speak to [facility] at [facility] regarding pull PICC orders on patient. Left message with [place] asking Jeani Hawking to return my call when available.     [place], CMA

## 2022-01-10 ENCOUNTER — Other Ambulatory Visit: Payer: Self-pay

## 2022-01-10 ENCOUNTER — Encounter: Payer: Self-pay | Admitting: Sports Medicine

## 2022-01-10 ENCOUNTER — Ambulatory Visit (INDEPENDENT_AMBULATORY_CARE_PROVIDER_SITE_OTHER): Payer: Managed Care, Other (non HMO) | Admitting: Sports Medicine

## 2022-01-10 VITALS — Temp 96.9°F

## 2022-01-10 DIAGNOSIS — Z89432 Acquired absence of left foot: Secondary | ICD-10-CM

## 2022-01-10 DIAGNOSIS — T8789 Other complications of amputation stump: Secondary | ICD-10-CM

## 2022-01-10 DIAGNOSIS — T8189XA Other complications of procedures, not elsewhere classified, initial encounter: Secondary | ICD-10-CM

## 2022-01-10 DIAGNOSIS — L97522 Non-pressure chronic ulcer of other part of left foot with fat layer exposed: Secondary | ICD-10-CM

## 2022-01-10 DIAGNOSIS — E1165 Type 2 diabetes mellitus with hyperglycemia: Secondary | ICD-10-CM

## 2022-01-10 NOTE — Progress Notes (Signed)
Subjective: Jasmine Richardson is a 61 y.o. female patient seen today in office for POV #5 DOS 11/23/2021, S/P left transmetatarsal amputation.  Patient reports that she is doing okay had a little bit of drainage and some swelling at her ankle.  Denies constitutional symptoms.  No other pedal complaints noted.  Patient Active Problem List   Diagnosis Date Noted   Bacteremia 11/22/2021   Diabetic foot infection ([place]) 11/21/2021   Abnormal stress test 10/23/2021   Abnormal EKG 10/23/2021   AKI (acute kidney injury) ([place]) 10/23/2021   Anxiety 10/23/2021   Cellulitis of right breast 10/23/2021   Gangrene ([place]) 10/23/2021   Hematoma 10/23/2021   Hiatal hernia 10/23/2021   Hyperglycemia 10/23/2021   Hyperglycemia due to type 2 diabetes mellitus ([place]) 10/23/2021   Hyponatremia 10/23/2021   Infection of great toe 10/23/2021   Lactic acidosis 10/23/2021   Leukocytosis 10/23/2021   Obesity (BMI 30-39.9) 10/23/2021   Perineal abscess 10/23/2021   Cellulitis, wound, post-operative 10/09/2021   Abnormal mammogram of left breast 09/04/2021   Breast wound, right, subsequent encounter 09/04/2021   Demand ischemia ([place])    ICD (implantable cardioverter-defibrillator) in place 05/16/2021   Ventricular tachycardia 12/17/2020   Unstable angina ([place]) 05/22/2020   NSTEMI (non-ST elevated myocardial infarction) ([place]) 05/22/2020   Diabetic foot ulcer ([place]) 02/16/2020   Angina pectoris ([place]) 09/11/2017   Emphysema lung ([place]) 09/11/2017   Chronic diastolic heart failure ([place]) 11/06/2016   Mediastinitis 06/28/2016   Wound, surgical, infected 06/06/2016   S/P CABG (coronary artery bypass graft) 06/03/2016   Severe sepsis ([place]) 06/03/2016   GERD (gastroesophageal reflux disease) 05/08/2016   Morbid obesity ([place]) 05/08/2016   Type 2 diabetes mellitus with foot ulcer ([place]) 05/08/2016   Sinus tachycardia 05/08/2016   Acute chest pain 05/08/2016   Burping 05/08/2016   Tobacco use disorder 04/20/2016    Coronary artery disease involving native coronary artery of native heart with angina pectoris ([place]) 09/12/2015   Essential hypertension 09/12/2015   Hyperlipidemia 09/12/2015    Current Outpatient Medications on File Prior to Visit  Medication Sig Dispense Refill   acetaminophen (TYLENOL) 325 MG tablet Take 2 tablets (650 mg total) by mouth every 4 (four) hours as needed for headache or mild pain.     amiodarone (PACERONE) 200 MG tablet Take 1 tablet (200 mg total) by mouth daily. 90 tablet 0   aspirin EC 81 MG tablet Take 81 mg by mouth daily. Swallow whole.     cholecalciferol (VITAMIN D3) 25 MCG (1000 UNIT) tablet Take 1,000 Units by mouth 2 (two) times a week.     clopidogrel (PLAVIX) 75 MG tablet Take 75 mg by mouth daily.     cyanocobalamin 1000 MCG tablet Take 1,000 mcg by mouth daily.     dapagliflozin propanediol (FARXIGA) 10 MG TABS tablet Take 10 mg by mouth daily.     daptomycin (CUBICIN) IVPB Inject 650 mg into the vein daily. Indication:  MRSA bacteremia First Dose: No Last Day of Therapy:  01/04/2022 Labs - Once weekly:  CBC/D, BMP, and CPK Labs - Every other week:  ESR and CRP Method of administration: IV Push Method of administration may be changed at the discretion of home infusion pharmacist based upon assessment of the patient and/or caregiver's ability to self-administer the medication ordered. 38 Units 0   fenofibrate (TRICOR) 145 MG tablet Take 1 tablet (145 mg total) by mouth daily. 90 tablet 0   furosemide (LASIX) 20 MG tablet Take 20 mg by mouth  daily.     HUMALOG KWIKPEN 100 UNIT/ML KwikPen Inject 10-50 Units into the skin at bedtime. Sliding scale     isosorbide mononitrate (IMDUR) 30 MG 24 hr tablet Take 1 tablet (30 mg total) by mouth daily. 30 tablet 0   metFORMIN (GLUCOPHAGE) 500 MG tablet Take 1 tablet (500 mg total) by mouth 2 (two) times daily. (Patient not taking: Reported on 11/22/2021) 180 tablet 0   metoprolol succinate (TOPROL XL) 25 MG 24 hr tablet  Take 1 tablet (25 mg total) by mouth 3 (three) times daily. 90 tablet 0   nitroGLYCERIN (NITROSTAT) 0.4 MG SL tablet Place 1 tablet (0.4 mg total) under the tongue every 5 (five) minutes as needed for chest pain. 270 tablet 12   nystatin (MYCOSTATIN/NYSTOP) powder Apply 1 application topically daily as needed (rash/yeast).     omeprazole (PRILOSEC) 20 MG capsule Take 20 mg by mouth daily.     rosuvastatin (CRESTOR) 20 MG tablet Take 1 tablet (20 mg total) by mouth at bedtime. 90 tablet 0   senna-docusate (SENOKOT-S) 8.6-50 MG tablet Take 1 tablet by mouth 2 (two) times daily. 20 tablet 0   TRESIBA FLEXTOUCH 100 UNIT/ML FlexTouch Pen Inject 30 Units into the skin daily.     valsartan (DIOVAN) 80 MG tablet Take 1 tablet (80 mg total) by mouth 2 (two) times daily. 180 tablet 3   vitamin C (ASCORBIC ACID) 250 MG tablet Take 250 mg by mouth at bedtime.     Vitamin D, Ergocalciferol, (DRISDOL) 1.25 MG (50000 UNIT) CAPS capsule Take 50,000 Units by mouth every Saturday.     No current facility-administered medications on file prior to visit.    Allergies  Allergen Reactions   Chlorhexidine Gluconate Itching    Received CHG bath, began itching, required benadryl   Cat Hair Extract Other (See Comments)    Sneezing, watery eyes.   Tramadol Other (See Comments)    Hallucinations   Vancomycin     Other reaction(s): Anaphylaxis*   Codeine Rash    Objective: There were no vitals filed for this visit.  General: No acute distress, AAOx3  Left foot sutures and staples intact except at the medial amputation stump site where there is a approximate 2cm length and 0.5cm With less fatty tissue exposed appears to be slowly improving, there is no malodor, minimal blanchable erythema, no purulence, no significant warmth minimal swelling no other acute signs or symptoms of infection.  Varicose veins noted at medial ankle.  No pain to palpation to foot or calf.  Assessment and Plan:  Problem List Items  Addressed This Visit   None Visit Diagnoses     Foot ulcer, left, with fat layer exposed ([place])    -  Primary   Delayed surgical wound healing of foot amputation stump ([place])       Uncontrolled type 2 diabetes mellitus with hyperglycemia ([place])       History of transmetatarsal amputation of left foot ([place])            -Patient seen and evaluated -Discussed with patient continued care wound dehiscence amputation stump site -Cleanse left foot at area of wound dehiscence and applied 1 Steri-Strips at the area of gapping to bring the skin edges closer together to assist with healing and applied Medihoney to the rest of the surgical stump site incision covered with dry dressing and advised husband to do the same daily -PICC line antibiotics completed -Discussed case with infectious disease who reports that we will  continue to hold antibiotics I also discussed secondarily after the patient left MRI concerns of osteomyelitis versus postsurgical changes to each of the metatarsals.  Upon further discussion with infectious disease we have agreed upon need for biopsy to help confirm diagnosis of osteomyelitis versus postsurgical I will discussed this plan with patient I did attempt to call patient after she left the office to discuss this plan and got a voicemail message if patient is agreeable we will plan for OR scheduling outpatient for biopsy on next week -Continue with nonweightbearing to the left amputation stump -Will plan for continued wound care at amputation stump also I did call advanced nursing and they do not provide any type of wound VAC care at this time patient will need to reach back out to her insurance to see if there is any agencies that could assist with this.  Landis Martins, DPM

## 2022-01-10 NOTE — Telephone Encounter (Signed)
Thank you :)

## 2022-01-11 ENCOUNTER — Other Ambulatory Visit: Payer: Self-pay | Admitting: Internal Medicine

## 2022-01-11 ENCOUNTER — Telehealth: Payer: Self-pay

## 2022-01-11 NOTE — Telephone Encounter (Signed)
Patient called the office in regards to her MRI results.   Please advice.[name]

## 2022-01-12 ENCOUNTER — Telehealth: Payer: Self-pay | Admitting: Sports Medicine

## 2022-01-12 ENCOUNTER — Encounter: Payer: Self-pay | Admitting: Sports Medicine

## 2022-01-12 NOTE — Progress Notes (Signed)
Contacted patient via phone call to discuss MRI results. Patient did not answer. This is the second attempt to reach patient. Callback number was provided for patient to return my call. Dr. Cannon Kettle

## 2022-01-12 NOTE — Telephone Encounter (Signed)
Patient returned my phone call. I discussed MRI results which suggest post surgical vs acute osteomyelitis at 1-5 metatarsal at TMA stump site. I discussed with patient treatment option of bone biopsy to help [place] confirm the diagnosis. Patient is agreeable for surgery of which we will try to schedule outpatient at [facility] on 2/6. Patient expressed understanding and had several questions about the post op recovery and treatment plan if there is still bone infection. I answered patient's questions to the best of my ability and re-assured her that we will continue to have infectious disease to also follow her to give an opinion if that's the case. Patient thanked me for my time and will follow up in office as scheduled. -Dr. Cannon Kettle

## 2022-01-12 NOTE — Progress Notes (Signed)
Called patient to discuss MRI results. Patient did not answer. Left voicemail with call back info.  Dr. Cannon Kettle

## 2022-01-16 ENCOUNTER — Telehealth: Payer: Self-pay | Admitting: Urology

## 2022-01-16 NOTE — Telephone Encounter (Signed)
DOS - 01/23/22  BONE BIOPSY LEFT --- 20220  [employer] EFFECTIVE DATE - 12/17/20   PER [employer]'S AUTOMATIVE SYSTEM CPT CODE 41282 NO PRIOR AUTH IS REQUIRED.  REF # [id number]

## 2022-01-18 ENCOUNTER — Other Ambulatory Visit: Payer: Self-pay

## 2022-01-18 ENCOUNTER — Encounter (HOSPITAL_COMMUNITY): Payer: Self-pay | Admitting: Sports Medicine

## 2022-01-18 NOTE — Progress Notes (Signed)
Device orders requested with [facility]. Device rep notified.

## 2022-01-18 NOTE — Progress Notes (Signed)
PCP - Dr. Rica Records Cardiologist - Dr. [name] EKG - 08/15/21 Chest x-ray - 12/12/21 ECHO - 11/27/21 Cardiac Cath - 07/21/21 CPAP -  Fasting Blood Sugar:  250-300 Checks Blood Sugar:  3x/day Blood Thinner Instructions: Follow your surgeon's instructions on when to stop Aspirin and Plavix.  If no instructions were given by your surgeon then you will need to call the office to get those instructions.   *per pt, she has an appt with Dr. Cannon Kettle tomorrow 01/19/22 - pt reminded to ask Dr. Cannon Kettle about ASA and plavix during appt - texted reminder to phone as well.  Aspirin Instructions:  ERAS Protcol -  COVID TEST- n/a  Anesthesia review: Y  -------------  SDW INSTRUCTIONS:  Your procedure is scheduled on Monday 2/6. Please report to [facility] Main Entrance "A" at 0830 A.M., and check in at the Admitting office. Call this number if you have problems the morning of surgery: [phone]   Remember: Do not eat or drink after midnight the night before your surgery   Medications to take morning of surgery with a sip of water include: acetaminophen (TYLENOL) if needed amiodarone (PACERONE)  fenofibrate (TRICOR)  isosorbide mononitrate (IMDUR)  metoprolol succinate (TOPROL-XL)  omeprazole (PRILOSEC)  rosuvastatin (CRESTOR)    ** PLEASE check your blood sugar the morning of your surgery when you wake up and every 2 hours until you get to the Short Stay unit.  If your blood sugar is less than 70 mg/dL, you will need to treat for low blood sugar: Do not take insulin. Treat a low blood sugar (less than 70 mg/dL) with  cup of clear juice (cranberry or apple), 4 glucose tablets, OR glucose gel. Recheck blood sugar in 15 minutes after treatment (to make sure it is greater than 70 mg/dL). If your blood sugar is not greater than 70 mg/dL on recheck, call [phone] for further instructions.  dapagliflozin propanediol (FARXIGA)  2/5 NONE 2/6 NONE  HUMALOG KWIKPEN  2/5 no bedtime dose 2/6  none  metFORMIN (GLUCOPHAGE)  2/5 take as usual 2/6 none  TRESIBA FLEXTOUCH  2/5 take as usual (30 units) 2/6 take 15 units  As of today, STOP taking any Aleve, Naproxen, Ibuprofen, Motrin, Advil, Goody's, BC's, all herbal medications, fish oil, and all vitamins.    The Morning of Surgery Do not wear jewelry, make-up or nail polish. Do not wear lotions, powders, or perfumes, or deodorant Do not bring valuables to the hospital. [facility] is not responsible for any belongings or valuables.  If you are a smoker, DO NOT Smoke 24 hours prior to surgery  If you wear a CPAP at night please bring your mask the morning of surgery   Remember that you must have someone to transport you home after your surgery, and remain with you for 24 hours if you are discharged the same day.  Please bring cases for contacts, glasses, hearing aids, dentures or bridgework because it cannot be worn into surgery.   Patients discharged the day of surgery will not be allowed to drive home.   Please shower the NIGHT BEFORE/MORNING OF SURGERY (use antibacterial soap like DIAL soap if possible). Wear comfortable clothes the morning of surgery. Oral Hygiene is also important to reduce your risk of infection.  Remember - BRUSH YOUR TEETH THE MORNING OF SURGERY WITH YOUR REGULAR TOOTHPASTE  Patient denies shortness of breath, fever, cough and chest pain.

## 2022-01-19 ENCOUNTER — Other Ambulatory Visit: Payer: Self-pay

## 2022-01-19 ENCOUNTER — Encounter: Payer: Self-pay | Admitting: Sports Medicine

## 2022-01-19 ENCOUNTER — Ambulatory Visit (INDEPENDENT_AMBULATORY_CARE_PROVIDER_SITE_OTHER): Payer: Managed Care, Other (non HMO) | Admitting: Sports Medicine

## 2022-01-19 ENCOUNTER — Ambulatory Visit: Payer: Managed Care, Other (non HMO) | Admitting: Internal Medicine

## 2022-01-19 ENCOUNTER — Encounter: Payer: Self-pay | Admitting: Internal Medicine

## 2022-01-19 DIAGNOSIS — E1165 Type 2 diabetes mellitus with hyperglycemia: Secondary | ICD-10-CM

## 2022-01-19 DIAGNOSIS — Z89432 Acquired absence of left foot: Secondary | ICD-10-CM

## 2022-01-19 DIAGNOSIS — Z9889 Other specified postprocedural states: Secondary | ICD-10-CM

## 2022-01-19 DIAGNOSIS — L97522 Non-pressure chronic ulcer of other part of left foot with fat layer exposed: Secondary | ICD-10-CM

## 2022-01-19 DIAGNOSIS — T8189XA Other complications of procedures, not elsewhere classified, initial encounter: Secondary | ICD-10-CM

## 2022-01-19 DIAGNOSIS — T8789 Other complications of amputation stump: Secondary | ICD-10-CM

## 2022-01-19 NOTE — Progress Notes (Signed)
PERIOPERATIVE PRESCRIPTION FOR IMPLANTED CARDIAC DEVICE PROGRAMMING  Patient Information: Name:  Jasmine Richardson  DOB:  Aug 06, 1961  MRN:  [id number]    Planned Procedure:  Left foot bone biopsy  Surgeon:  Dr. Cannon Kettle  Date of Procedure:  01/22/22  Cautery will be used.  Position during surgery:     Please send documentation back to:  [facility] (Fax # [phone])   Device Information:  Clinic EP Physician:  Cristopher Peru, MD   Device Type:  Defibrillator Manufacturer and Phone #:  [employer]ott: [phone] Pacemaker Dependent?:  No. Date of Last Device Check:  12/19/2021 Remote Normal Device Function?:  Yes.    Electrophysiologist's Recommendations:  Have magnet available. Provide continuous ECG monitoring when magnet is used or reprogramming is to be performed.  Procedure should not interfere with device function.  No device programming or magnet placement needed.  Per Device Clinic Standing Orders, Wanda Plump, RN  7:23 AM 01/19/2022

## 2022-01-19 NOTE — Progress Notes (Signed)
Subjective: Jasmine Richardson is a 61 y.o. female patient seen today in office for POV #6 DOS 11/23/2021, S/P left transmetatarsal amputation.  Patient reports that she had more drainage from the lateral side of the stump states that the area where remove the staples last visit feels like it has opened up a little bit more.  Denies constitutional symptoms.  No other pedal complaints noted.  Patient is assisted by husband with helps to report this history.  Patient Active Problem List   Diagnosis Date Noted   Bacteremia 11/22/2021   Diabetic foot infection ([place]) 11/21/2021   Abnormal stress test 10/23/2021   Abnormal EKG 10/23/2021   AKI (acute kidney injury) ([place]) 10/23/2021   Anxiety 10/23/2021   Cellulitis of right breast 10/23/2021   Gangrene ([place]) 10/23/2021   Hematoma 10/23/2021   Hiatal hernia 10/23/2021   Hyperglycemia 10/23/2021   Hyperglycemia due to type 2 diabetes mellitus ([place]) 10/23/2021   Hyponatremia 10/23/2021   Infection of great toe 10/23/2021   Lactic acidosis 10/23/2021   Leukocytosis 10/23/2021   Obesity (BMI 30-39.9) 10/23/2021   Perineal abscess 10/23/2021   Cellulitis, wound, post-operative 10/09/2021   Abnormal mammogram of left breast 09/04/2021   Breast wound, right, subsequent encounter 09/04/2021   Demand ischemia ([place])    ICD (implantable cardioverter-defibrillator) in place 05/16/2021   Ventricular tachycardia 12/17/2020   Unstable angina ([place]) 05/22/2020   NSTEMI (non-ST elevated myocardial infarction) ([place]) 05/22/2020   Diabetic foot ulcer ([place]) 02/16/2020   Angina pectoris ([place]) 09/11/2017   Emphysema lung ([place]) 09/11/2017   Chronic diastolic heart failure ([place]) 11/06/2016   Mediastinitis 06/28/2016   Wound, surgical, infected 06/06/2016   S/P CABG (coronary artery bypass graft) 06/03/2016   Severe sepsis ([place]) 06/03/2016   GERD (gastroesophageal reflux disease) 05/08/2016   Morbid obesity ([place]) 05/08/2016   Type 2 diabetes mellitus with foot  ulcer ([place]) 05/08/2016   Sinus tachycardia 05/08/2016   Acute chest pain 05/08/2016   Burping 05/08/2016   Tobacco use disorder 04/20/2016   Coronary artery disease involving native coronary artery of native heart with angina pectoris ([place]) 09/12/2015   Essential hypertension 09/12/2015   Hyperlipidemia 09/12/2015    Current Outpatient Medications on File Prior to Visit  Medication Sig Dispense Refill   acetaminophen (TYLENOL) 325 MG tablet Take 2 tablets (650 mg total) by mouth every 4 (four) hours as needed for headache or mild pain.     amiodarone (PACERONE) 200 MG tablet Take 200 mg by mouth daily.     aspirin EC 81 MG tablet Take 81 mg by mouth daily. Swallow whole.     clopidogrel (PLAVIX) 75 MG tablet Take 75 mg by mouth daily.     dapagliflozin propanediol (FARXIGA) 10 MG TABS tablet Take 10 mg by mouth daily.     daptomycin (CUBICIN) IVPB Inject 650 mg into the vein daily. Indication:  MRSA bacteremia First Dose: No Last Day of Therapy:  01/04/2022 Labs - Once weekly:  CBC/D, BMP, and CPK Labs - Every other week:  ESR and CRP Method of administration: IV Push Method of administration may be changed at the discretion of home infusion pharmacist based upon assessment of the patient and/or caregiver's ability to self-administer the medication ordered. (Patient not taking: Reported on 01/16/2022) 38 Units 0   Evolocumab (REPATHA SURECLICK) 161 MG/ML SOAJ Inject 140 mg into the skin every 14 (fourteen) days.     fenofibrate (TRICOR) 145 MG tablet Take 145 mg by mouth daily.     furosemide (  LASIX) 20 MG tablet Take 20 mg by mouth daily.     HUMALOG KWIKPEN 100 UNIT/ML KwikPen Inject 10-50 Units into the skin at bedtime. Sliding scale     isosorbide mononitrate (IMDUR) 30 MG 24 hr tablet Take 30 mg by mouth daily.     metFORMIN (GLUCOPHAGE) 500 MG tablet Take 500 mg by mouth 2 (two) times daily with a meal.     metoprolol succinate (TOPROL-XL) 25 MG 24 hr tablet Take 25 mg by mouth in  the morning, at noon, and at bedtime.     nitroGLYCERIN (NITROSTAT) 0.4 MG SL tablet Place 0.4 mg under the tongue every 5 (five) minutes as needed for chest pain.     nystatin (MYCOSTATIN/NYSTOP) powder Apply 1 application topically daily as needed (rash/yeast).     omeprazole (PRILOSEC) 20 MG capsule Take 20 mg by mouth daily.     rosuvastatin (CRESTOR) 20 MG tablet Take 20 mg by mouth daily.     senna-docusate (SENOKOT-S) 8.6-50 MG tablet Take 1 tablet by mouth 2 (two) times daily. (Patient not taking: Reported on 01/16/2022) 20 tablet 0   TRESIBA FLEXTOUCH 100 UNIT/ML FlexTouch Pen Inject 30 Units into the skin daily.     valsartan (DIOVAN) 80 MG tablet Take 1 tablet (80 mg total) by mouth 2 (two) times daily. 180 tablet 3   vitamin B-12 (CYANOCOBALAMIN) 1000 MCG tablet Take 1,000 mcg by mouth daily.     vitamin C (ASCORBIC ACID) 500 MG tablet Take 500 mg by mouth daily.     Vitamin D, Ergocalciferol, (DRISDOL) 1.25 MG (50000 UNIT) CAPS capsule Take 50,000 Units by mouth every Saturday.     No current facility-administered medications on file prior to visit.    Allergies  Allergen Reactions   Vancomycin Anaphylaxis   Chlorhexidine Gluconate Itching    Received CHG bath, began itching, required benadryl   Cat Hair Extract Other (See Comments)    Sneezing, watery eyes.   Tramadol Other (See Comments)    Hallucinations   Codeine Rash    Objective: There were no vitals filed for this visit.  General: No acute distress, AAOx3  Left foot sutures and staples intact except at the medial amputation stump site where there is a approximate 1.5cm length and 0.3cm in width with less fatty tissue exposed appears to be slowly improving, and no gapping noted at the lateral aspect of the amputation stump site that measures approximately 0.5 in length by 0.3 in width, there is no malodor, minimal blanchable erythema, no purulence, no significant warmth minimal swelling no other acute signs or  symptoms of infection.  Varicose veins noted at medial ankle.  No pain to palpation to foot or calf.  Assessment and Plan:  Problem List Items Addressed This Visit   None Visit Diagnoses     Foot ulcer, left, with fat layer exposed ([place])    -  Primary   Delayed surgical wound healing of foot amputation stump ([place])       Uncontrolled type 2 diabetes mellitus with hyperglycemia ([place])       History of transmetatarsal amputation of left foot ([facility])       S/P foot surgery, left            -Patient seen and evaluated -Discussed with patient continued care wound dehiscence now x2 at amputation stump site -Cleansed left foot at area of wound dehiscence and applied 1 Steri-Strips medially and 3 Steri-Strips laterally at the area of gapping to bring the  skin edges closer together to assist with healing and applied Medihoney to the rest of the surgical stump site incision covered with dry dressing and advised husband to do the same daily -Discussed with patient OR biopsy patient is awaiting medical clearance from PCP has appointment on 2/9 and we anticipate doing surgery outpatient at [facility] on 2/16 -Patient is aware to hold Plavix prior to surgery and plan to resume after -Continue with nonweightbearing to the left amputation stump with use of wheelchair or knee scooter -Will plan for continued wound care at amputation stump and to sign surgery consent at next visit. Landis Martins, DPM

## 2022-01-22 ENCOUNTER — Telehealth: Payer: Self-pay | Admitting: Sports Medicine

## 2022-01-22 NOTE — Telephone Encounter (Signed)
Pt also needed to know if they were doing local anesthesia or if she is being out to sleep fully.

## 2022-01-22 NOTE — Telephone Encounter (Signed)
Dr Rica Records is requesting Jasmine Richardson to get bloodwk (appt tomorrow) because she has not had sugars checked except by home health & also wants her to see Dr. Onnie Graham for cardiac clearance(appt in March that she's had for 6 mths)She advised Jasmine Richardson she needed this prior to signing paperwork.  Pls advise.

## 2022-01-22 NOTE — Telephone Encounter (Signed)
Pt called back stating Dr. Shirlee More said he will sign all papers for clearance and provided fax #.  Forms were faxed to [phone].

## 2022-01-23 ENCOUNTER — Telehealth: Payer: Self-pay

## 2022-01-23 NOTE — Telephone Encounter (Signed)
° °  Pre-operative Risk Assessment    Patient Name: Jasmine Richardson  DOB: Apr 12, 1961 MRN: [id number]      Request for Surgical Clearance    Procedure:   Bone Biopsy of Left Foot  Date of Surgery:  Clearance 02/01/22                                 Surgeon:  Dr. Evonnie Pat Group or Practice Name:  [place] Phone number:  [phone] Fax number:  [phone]   Type of Clearance Requested:   - Medical    Type of Anesthesia:   Unspecified    Additional requests/questions:  Please advise surgeon/provider what medications should be held.  Signed, Gita Kudo   01/23/2022, 11:26 AM

## 2022-01-23 NOTE — Telephone Encounter (Signed)
I did confirm a fax number on the website of [phone]. I believe the fax number on the clearance request may just be missing the dash. I confirmed this by calling the fax number listed [phone]. I will fax to both to be sure.

## 2022-01-23 NOTE — Telephone Encounter (Signed)
° °  Patient Name: Jasmine Richardson  DOB: 1961-11-28 MRN: [id number]  Primary Cardiologist: Shirlee More, MD  Chart revisited as part of pre-operative protocol coverage. Per Dr. [name], "Is a low risk procedure she can stop clopidogrel 5 to 7 days.  Like to see her remain on aspirin. No further preoperative evaluation is needed." We will defer to surgeon to review Dr. Joya Gaskins recommendation and for them to relay final instructions on blood thinner to patient.  I reached out to patient for update on how she is doing. The patient affirms she has not had any new cardiac symptoms. The patient was advised that if she develops new symptoms prior to surgery to contact our office to arrange for a follow-up visit, and she verbalized understanding.  It is not clear to me that the fax number is correct outlined below given odd format so will route to callback to assist with getting the correct fax # and faxing this clearance to them.  Charlie Pitter, PA-C 01/23/2022, 4:34 PM

## 2022-01-23 NOTE — Telephone Encounter (Signed)
° °  Name: Jasmine Richardson  DOB: 08-11-1961  MRN: [id number]   Primary Cardiologist: Shirlee More, MD  Chart reviewed as part of pre-operative protocol coverage. Patient was contacted 01/23/2022 in reference to pre-operative risk assessment for pending surgery as outlined below.  Jasmine Richardson was last seen on 08/2021 by Dr. [name]. Per his notes, patient has history of "CAD with CABG 1660 complicated by postoperative mediastinitis reoperation at [facility] with myocutaneous pectoralis flap closure ACS with PCI and stent native right coronary artery 05/25/2020 with a "inclusion of the vein graft to the right coronary artery and subsequent complex PCI and stent to the coronary artery with shockwave intravascular lithotripsy 06/22/2020 dissection conclusion small caliber first diagonal branch." Other problems include VT requiring ICD, chronic combined CHF, DM, HTN, HLD. At time of last cath 07/2021 she had Severe native coronary artery disease, as detailed below, not significantly changed from prior catheterization in 12/2020, patent LIMA-LAD and LMCA stent, Patent mid RCA stents with moderate restenosis (~40%), moderately reduced LV function EF 40-45%. Had recent TEE 11/2021 for bacteremia (during admission for left diabetic foot infection, osteomyelitis) showing EF 50-55%, evidence of atrial level shunting by color flow Doppler. Agitated saline contrast bubble study was positive with shunting observed within 3-6 cardiac cycles suggestive of interatrial shunt.  Given extremely complex medical history with high risk comorbidities, will route to Dr. [name] for input on surgical clearance for bone biopsy as outlined. Although not listed, surgeon is asking for input on what medication should be held. Patient is on ASA + Plavix. Will await input from Dr. [name] on this as well. Dr. [name] - Please route response to P CV DIV PREOP (the pre-op pool). Thank you.   Charlie Pitter, PA-C 01/23/2022, 1:52 PM

## 2022-01-24 ENCOUNTER — Ambulatory Visit (INDEPENDENT_AMBULATORY_CARE_PROVIDER_SITE_OTHER): Payer: Managed Care, Other (non HMO) | Admitting: Sports Medicine

## 2022-01-24 ENCOUNTER — Encounter: Payer: Self-pay | Admitting: Sports Medicine

## 2022-01-24 DIAGNOSIS — E1165 Type 2 diabetes mellitus with hyperglycemia: Secondary | ICD-10-CM

## 2022-01-24 DIAGNOSIS — T8789 Other complications of amputation stump: Secondary | ICD-10-CM

## 2022-01-24 DIAGNOSIS — L97522 Non-pressure chronic ulcer of other part of left foot with fat layer exposed: Secondary | ICD-10-CM

## 2022-01-24 DIAGNOSIS — T8189XA Other complications of procedures, not elsewhere classified, initial encounter: Secondary | ICD-10-CM

## 2022-01-24 DIAGNOSIS — Z9889 Other specified postprocedural states: Secondary | ICD-10-CM

## 2022-01-24 DIAGNOSIS — Z89432 Acquired absence of left foot: Secondary | ICD-10-CM

## 2022-01-24 NOTE — Progress Notes (Signed)
Subjective: Jasmine Richardson is a 61 y.o. female patient seen today in office for POV #7  DOS 11/23/2021, S/P left transmetatarsal amputation.  Patient reports that she had an episode of pain in the foot and drainage about the same.  Denies constitutional symptoms.  No other pedal complaints noted.  Patient Active Problem List   Diagnosis Date Noted   Bacteremia 11/22/2021   Diabetic foot infection ([place]) 11/21/2021   Abnormal stress test 10/23/2021   Abnormal EKG 10/23/2021   AKI (acute kidney injury) ([place]) 10/23/2021   Anxiety 10/23/2021   Cellulitis of right breast 10/23/2021   Gangrene ([place]) 10/23/2021   Hematoma 10/23/2021   Hiatal hernia 10/23/2021   Hyperglycemia 10/23/2021   Hyperglycemia due to type 2 diabetes mellitus ([place]) 10/23/2021   Hyponatremia 10/23/2021   Infection of great toe 10/23/2021   Lactic acidosis 10/23/2021   Leukocytosis 10/23/2021   Obesity (BMI 30-39.9) 10/23/2021   Perineal abscess 10/23/2021   Cellulitis, wound, post-operative 10/09/2021   Abnormal mammogram of left breast 09/04/2021   Breast wound, right, subsequent encounter 09/04/2021   Demand ischemia ([place])    ICD (implantable cardioverter-defibrillator) in place 05/16/2021   Ventricular tachycardia 12/17/2020   Unstable angina ([place]) 05/22/2020   NSTEMI (non-ST elevated myocardial infarction) ([place]) 05/22/2020   Diabetic foot ulcer ([place]) 02/16/2020   Angina pectoris ([place]) 09/11/2017   Emphysema lung ([place]) 09/11/2017   Chronic diastolic heart failure ([place]) 11/06/2016   Mediastinitis 06/28/2016   Wound, surgical, infected 06/06/2016   S/P CABG (coronary artery bypass graft) 06/03/2016   Severe sepsis ([place]) 06/03/2016   GERD (gastroesophageal reflux disease) 05/08/2016   Morbid obesity ([place]) 05/08/2016   Type 2 diabetes mellitus with foot ulcer ([place]) 05/08/2016   Sinus tachycardia 05/08/2016   Acute chest pain 05/08/2016   Burping 05/08/2016   Tobacco use disorder 04/20/2016   Coronary artery  disease involving native coronary artery of native heart with angina pectoris ([place]) 09/12/2015   Essential hypertension 09/12/2015   Hyperlipidemia 09/12/2015    Current Outpatient Medications on File Prior to Visit  Medication Sig Dispense Refill   acetaminophen (TYLENOL) 325 MG tablet Take 2 tablets (650 mg total) by mouth every 4 (four) hours as needed for headache or mild pain.     amiodarone (PACERONE) 200 MG tablet Take 200 mg by mouth daily.     aspirin EC 81 MG tablet Take 81 mg by mouth daily. Swallow whole.     clopidogrel (PLAVIX) 75 MG tablet Take 75 mg by mouth daily.     dapagliflozin propanediol (FARXIGA) 10 MG TABS tablet Take 10 mg by mouth daily.     daptomycin (CUBICIN) IVPB Inject 650 mg into the vein daily. Indication:  MRSA bacteremia First Dose: No Last Day of Therapy:  01/04/2022 Labs - Once weekly:  CBC/D, BMP, and CPK Labs - Every other week:  ESR and CRP Method of administration: IV Push Method of administration may be changed at the discretion of home infusion pharmacist based upon assessment of the patient and/or caregiver's ability to self-administer the medication ordered. (Patient not taking: Reported on 01/16/2022) 38 Units 0   Evolocumab (REPATHA SURECLICK) 469 MG/ML SOAJ Inject 140 mg into the skin every 14 (fourteen) days.     fenofibrate (TRICOR) 145 MG tablet Take 145 mg by mouth daily.     furosemide (LASIX) 20 MG tablet Take 20 mg by mouth daily.     HUMALOG KWIKPEN 100 UNIT/ML KwikPen Inject 10-50 Units into the skin at bedtime. Sliding  scale     isosorbide mononitrate (IMDUR) 30 MG 24 hr tablet Take 30 mg by mouth daily.     metFORMIN (GLUCOPHAGE) 500 MG tablet Take 500 mg by mouth 2 (two) times daily with a meal.     metoprolol succinate (TOPROL-XL) 25 MG 24 hr tablet Take 25 mg by mouth in the morning, at noon, and at bedtime.     nitroGLYCERIN (NITROSTAT) 0.4 MG SL tablet Place 0.4 mg under the tongue every 5 (five) minutes as needed for chest  pain.     nystatin (MYCOSTATIN/NYSTOP) powder Apply 1 application topically daily as needed (rash/yeast).     omeprazole (PRILOSEC) 20 MG capsule Take 20 mg by mouth daily.     rosuvastatin (CRESTOR) 20 MG tablet Take 20 mg by mouth daily.     senna-docusate (SENOKOT-S) 8.6-50 MG tablet Take 1 tablet by mouth 2 (two) times daily. (Patient not taking: Reported on 01/16/2022) 20 tablet 0   TRESIBA FLEXTOUCH 100 UNIT/ML FlexTouch Pen Inject 30 Units into the skin daily.     valsartan (DIOVAN) 80 MG tablet Take 1 tablet (80 mg total) by mouth 2 (two) times daily. 180 tablet 3   vitamin B-12 (CYANOCOBALAMIN) 1000 MCG tablet Take 1,000 mcg by mouth daily.     vitamin C (ASCORBIC ACID) 500 MG tablet Take 500 mg by mouth daily.     Vitamin D, Ergocalciferol, (DRISDOL) 1.25 MG (50000 UNIT) CAPS capsule Take 50,000 Units by mouth every Saturday.     No current facility-administered medications on file prior to visit.    Allergies  Allergen Reactions   Vancomycin Anaphylaxis   Chlorhexidine Gluconate Itching    Received CHG bath, began itching, required benadryl   Cat Hair Extract Other (See Comments)    Sneezing, watery eyes.   Tramadol Other (See Comments)    Hallucinations   Codeine Rash    Objective: There were no vitals filed for this visit.  General: No acute distress, AAOx3  Left foot sutures and staples intact except at the medial amputation stump site where there is a approximate 1.5cm length and 0.3cm in width with less fatty tissue exposed appears to be slowly improving, and no gapping noted at the lateral aspect of the amputation stump site that measures approximately 0.5 in length by 0.3 in width, unchanged from prior, there is no malodor, minimal blanchable erythema, no purulence, no significant warmth minimal swelling no other acute signs or symptoms of infection.  Varicose veins noted at medial ankle.  No pain to palpation to foot or calf.  Assessment and Plan:  Problem List  Items Addressed This Visit   None Visit Diagnoses     Foot ulcer, left, with fat layer exposed ([place])    -  Primary   Delayed surgical wound healing of foot amputation stump ([place])       Uncontrolled type 2 diabetes mellitus with hyperglycemia ([place])       History of transmetatarsal amputation of left foot ([facility])       S/P foot surgery, left            -Patient seen and evaluated -Discussed with patient continued care wound dehiscence x2 at amputation stump site -Cleansed left foot at area of wound dehiscence and applied 1 Steri-Strips medially and 3 Steri-Strips laterally at the area of gapping to bring the skin edges closer together to assist with healing and applied Medihoney to the rest of the surgical stump site incision covered with dry dressing and advised  husband to do the same daily -Discussed with patient OR biopsy patient is awaiting medical clearance from PCP has appointment on 2/9 and clearance from cardiologist already reviewed advised patient to hold Plavix starting on Saturday but continue with aspirin -Surgery consent signed today for biopsy of 1 through 5 metatarsals and resuturing of the amputation stump site of the left foot.  Patient is aware that she must be n.p.o. after midnight on Wednesday 2/15 in preparation for surgery on 2/16 -Continue with nonweightbearing to the left amputation stump with use of wheelchair or knee scooter advised patient that she must comply with this in order to prevent complications after surgery -Return to office after surgery or sooner if problems or issues arise. Landis Martins, DPM

## 2022-01-27 ENCOUNTER — Other Ambulatory Visit: Payer: Self-pay | Admitting: Internal Medicine

## 2022-01-27 ENCOUNTER — Other Ambulatory Visit: Payer: Self-pay | Admitting: Student

## 2022-01-29 ENCOUNTER — Encounter: Payer: Managed Care, Other (non HMO) | Admitting: Podiatry

## 2022-01-31 ENCOUNTER — Encounter (HOSPITAL_COMMUNITY): Payer: Self-pay | Admitting: Sports Medicine

## 2022-01-31 ENCOUNTER — Telehealth: Payer: Self-pay | Admitting: *Deleted

## 2022-01-31 ENCOUNTER — Other Ambulatory Visit: Payer: Self-pay

## 2022-01-31 NOTE — Progress Notes (Signed)
Spoke with Karoline Caldwell, PA who is reviewing patient's chart.  Jeneen Rinks talked with Dr Leeanne Rio Office and patient does not need PCP clearance for surgery, only cardiac clearance.  Cardiac clearance is in Epic on 01/23/22.  Patient was notified that she did not need PCP clearance for surgery.

## 2022-01-31 NOTE — Progress Notes (Signed)
Saw note from Dr Cannon Kettle who is requesting clearance from PCP and Cardiologist.  Cardiologist clearance is in [place] on 01/23/22.  However, I do not see clearance from PCP Dr Rica Records.  Patient informed needing clearance from Dr Rica Records.  Patient to follow up with Dr Rica Records.  Fax number provided to patient for MD to submit clearance.  Patient verbalized understanding.

## 2022-01-31 NOTE — Progress Notes (Signed)
Anesthesia Chart Review:  Follows with cardiology for history of V. tach s/p AICD, HTN, HLD, CAD with prior stenting to RCA in 2009, CABG x4 in 9528 complicated by mediastinitis requiring reoperation, and more recently PCI/DES to in-stent restenosis of RCA on 05/25/2020 in setting of NTEMI.  Patient was also noted to have left main disease at that time it was decided to undergo planned coronary intervention as an outpatient which she had on 06/22/2020. She underwent successful shockwave intravascular lithotripsy and DES of left main. There was loss of a small caliber 1st Diag due to mid vessel wire dissection at the initial graft insertion site. This was too small for PCI.  Cath 07/21/2021 showed widely patent LIMA to LAD graft and L MCA stent, patent mid RCA stents with ~40% restenosis, moderately reduced LVEF 40 to 45%. Not significantly changed from prior catheterization 12/2020.  There were no targets for intervention and aggressive secondary prevention was recommended.  Patient was last seen by Dr. [name] on 08/29/2021, stable at that time from cardiac standpoint, no changes made to management.  Cardiology clearance per telephone encounter 01/23/2019., "Chart revisited as part of pre-operative protocol coverage. Per Dr. [name], "Is a low risk procedure she can stop clopidogrel 5 to 7 days.  Like to see her remain on aspirin. No further preoperative evaluation is needed." We will defer to surgeon to review Dr. Joya Gaskins recommendation and for them to relay final instructions on blood thinner to patient. I reached out to patient for update on how she is doing. The patient affirms she has not had any new cardiac symptoms. The patient was advised that if she develops new symptoms prior to surgery to contact our office to arrange for a follow-up visit, and she verbalized understanding."  Patient reported last dose Plavix 01/27/2022.  Follows with vascular surgery for history of PAD, s/p left anterior tibial artery  angioplasty 10/11/2021.  Patient was admitted December 2022 for sepsis and left diabetic foot infection in the setting of recent dilatation and debridement. MRI foot noted findings suspicious for osteomyelitis involving first metatarsal, second and third metatarsal heads and the second and third proximal phalanges, myositis. Underwent transmetatarsal amputation 12/8.  Echo showed preserved EF, no vegetations noted,  small, highly echogenic, well circumscribed mass measuring  0.6x1.0cm on the [place] most consistent with asymmetric calcification. This was also present on prior study and unlikely to represent a vegetation.  TEE was negative for endocarditis.  I was contacted by surgical scheduler at Dr. Leeanne Rio office and notified the patient's PCP did not clear the patient for surgery due to history of uncontrolled insulin-dependent DM2.  I advised that the patient does have cardiac clearance and I do not see uncontrolled diabetes as a reason to postpone investigation of osteomyelitis.  I made clear that markedly uncontrolled blood sugar on day of surgery could preclude the provision of safe anesthesia.  Last A1c in epic 10.7 on 11/23/2021.  Patient will need day of surgery labs and evaluation.  EKG 08/15/2021: Sinus rhythm with first-degree AV block.  Rate 60.  Rightward axis.  Low voltage QRS.  Cannot rule out anteroseptal infarct, age undetermined.  Not significantly changed from prior.  TEE 11/27/2021:  1. Device lead seen in RA, RV without obvious vegetations.   2. No obvious vegetations seen.   3. Left ventricular ejection fraction, by estimation, is 50 to 55%. The  left ventricle has low normal function.   4. Right ventricular systolic function is normal. The right ventricular  size  is normal.   5. No left atrial/left atrial appendage thrombus was detected.   6. The mitral valve is normal in structure. Trivial mitral valve  regurgitation.   7. The aortic valve is tricuspid. Aortic valve  regurgitation is not  visualized. Aortic valve sclerosis is present, with no evidence of aortic  valve stenosis.   8. Evidence of atrial level shunting detected by color flow Doppler.  Agitated saline contrast bubble study was positive with shunting observed  within 3-6 cardiac cycles suggestive of interatrial shunt.   TTE 11/23/2021:  1. Left ventricular ejection fraction, by estimation, is 50 to 55%. The  left ventricle has low normal function. The LV endocardium is incompletely  visualized and it is difficult to assess wall motion. Based on available  views, the mid-to-apical inferior   and apical inferoseptal walls are mildly hypokinetic. There is mild  concentric left ventricular hypertrophy. Left ventricular diastolic  parameters are consistent with Grade I diastolic dysfunction (impaired  relaxation).   2. Right ventricular systolic function is normal. The right ventricular  size is normal. There is normal pulmonary artery systolic pressure.   3. The mitral valve is normal in structure. Trivial mitral valve  regurgitation. No evidence of mitral stenosis.   4. The aortic valve is tricuspid. There is mild-to-moderate calcification  of the aortic valve. There is mild thickening of the aortic valve. Aortic  valve regurgitation is not visualized. Aortic valve  sclerosis/calcification is present, without any  evidence of aortic stenosis.   5. There is a small, highly echogenic, well circumscribed mass measuring  0.6x1.0cm on the [place] most consistent with asymmetric calcification. This  was also present on prior study and unlikely to represent a vegetation.   6. No valvular vegetations visualized. Consider TEE to exclude infective  endocarditis if clinically indicated.   Conclusion(s)/Recommendation(s): No evidence of valvular vegetations on  this transthoracic echocardiogram. Consider a transesophageal  echocardiogram to exclude infective endocarditis if clinically indicated.   Cath  07/21/2021: Conclusions: Severe native coronary artery disease, as detailed below, not significantly changed from prior catheterization in 12/2020. Widely patent LIMA-LAD graft and LMCA stent. Patent mid RCA stents with moderate restenosis (~40%). Moderately reduced left ventricular systolic function (LVEF 30-16%). Upper normal left ventricular filling pressure (LVEDP 15 mmHg).   Recommendations: Escalate antianginal therapy; no focal targets again noted on today's catheterization. Aggressive secondary prevention.     Wynonia Musty [facility]/Anesthesiology Phone [phone] 01/31/2022 1:02 PM

## 2022-01-31 NOTE — Telephone Encounter (Signed)
Patient is calling to request medical supplies,bandages post surgery from [facility]. Please advise.

## 2022-01-31 NOTE — Progress Notes (Signed)
DUE TO COVID-19 ONLY ONE VISITOR IS ALLOWED TO COME WITH YOU AND STAY IN THE WAITING ROOM ONLY DURING PRE OP AND PROCEDURE DAY OF SURGERY.   PCP - Dr Rica Records Cardiologist - Dr [name] (Clearance 01/23/22 Epic) EP - Dr Cristopher Peru  Chest x-ray - 01/08/22 CE EKG - 08/15/21 Stress Test - 10/23/21   ECHO - 11/27/21 Cardiac Cath - 07/21/21  ICD Pacemaker (St Jude/[employer]ott) - Yes, device orders on chart.  Device Rep Windle Guard [phone]) was notified.  Last remote check was on 12/19/21.  Sleep Study -  n/a CPAP - none  Do not take [place] on Wednesday (2/15) or Thursday (2/16, the morning of surgery).  Do not take Metformin on Thursday - DOS.   THE NIGHT BEFORE SURGERY, do not take Humalog bedtime dose on Wednesday.       THE MORNING OF SURGERY, take 20 units of Tresiba Insulin.    If your blood sugar is less than 70 mg/dL, you will need to treat for low blood sugar: Treat a low blood sugar (less than 70 mg/dL) with  cup of clear juice (cranberry or apple), 4 glucose tablets, OR glucose gel. Recheck blood sugar in 15 minutes after treatment (to make sure it is greater than 70 mg/dL). If your blood sugar is not greater than 70 mg/dL on recheck, call [phone] for further instructions.  Aspirin - Continue per Dr Cannon Kettle (see progress note on 01/24/22).   Blood Thinner Instructions:  Follow your surgeon's instructions on when to stop Plavix prior to surgery.  Last dose was on Sat. 01/27/22  ERAS: Clear liquids til 11 am DOS.  Anesthesia review: Yes  STOP now taking any Aspirin (unless otherwise instructed by your surgeon), Aleve, Naproxen, Ibuprofen, Motrin, Advil, Goody's, BC's, all herbal medications, fish oil, and all vitamins.   Coronavirus Screening Covid test n/a  scheduled on  Do you have any of the following symptoms:  Cough yes/no: No Fever (>100.30F)  yes/no: No Runny nose yes/no: No Sore throat yes/no: No Difficulty breathing/shortness of breath  yes/no: No  Have you traveled in  the last 14 days and where? yes/no: No  Patient verbalized understanding of instructions that were given via phone.ami

## 2022-01-31 NOTE — Anesthesia Preprocedure Evaluation (Addendum)
Anesthesia Evaluation  Patient identified by MRN, date of birth, ID band Patient awake    Reviewed: Allergy & Precautions, NPO status , Patient's Chart, lab work & pertinent test results, reviewed documented beta blocker date and time   History of Anesthesia Complications Negative for: history of anesthetic complications  Airway Mallampati: I  TM Distance: >3 FB Neck ROM: Full    Dental  (+) Edentulous Upper, Edentulous Lower   Pulmonary COPD, former smoker,    breath sounds clear to auscultation       Cardiovascular hypertension, Pt. on medications and Pt. on home beta blockers (-) angina (no chest pain in over a year)+ CAD, + Cardiac Stents, + CABG and + Peripheral Vascular Disease  + dysrhythmias Atrial Fibrillation + pacemaker + Cardiac Defibrillator (device has never delivered a shock to patient)  Rhythm:Irregular Rate:Normal  11/2021 ECHO: EF 50-55%, LV has low normal function, infero-septal hypokinesis, mild LVH, Grade 1 DD, normal RVF, trivial MR   Neuro/Psych Anxiety Peripheral neuropathy    GI/Hepatic Neg liver ROS, GERD  Medicated and Controlled,  Endo/Other  diabetes (glu 252), Insulin DependentMorbid obesity  Renal/GU Renal InsufficiencyRenal disease     Musculoskeletal   Abdominal (+) + obese,   Peds  Hematology negative hematology ROS (+)   Anesthesia Other Findings   Reproductive/Obstetrics                           Anesthesia Physical Anesthesia Plan  ASA: 4  Anesthesia Plan: MAC   Post-op Pain Management: Tylenol PO (pre-op)*   Induction:   PONV Risk Score and Plan: 2 and Ondansetron and Treatment may vary due to age or medical condition  Airway Management Planned: Natural Airway and Simple Face Mask  Additional Equipment: None  Intra-op Plan:   Post-operative Plan:   Informed Consent: I have reviewed the patients History and Physical, chart, labs and  discussed the procedure including the risks, benefits and alternatives for the proposed anesthesia with the patient or authorized representative who has indicated his/her understanding and acceptance.       Plan Discussed with: CRNA and Surgeon  Anesthesia Plan Comments: (PAT note by Karoline Caldwell, PA-C: Follows with cardiology for history of V. tach s/p AICD, HTN, HLD, CAD with prior stenting to RCA in 2009, CABG x4 in 1308 complicated by mediastinitis requiring reoperation, and more recently PCI/DES to in-stent restenosis of RCA on 05/25/2020 in setting of NTEMI.  Patient was also noted to have left main disease at that time it was decided to undergo planned coronary intervention as an outpatient which she had on 06/22/2020. She underwent successful shockwave intravascular lithotripsy and DES of left main. There was loss of a small caliber 1st Diag due to mid vessel wire dissection at the initial graft insertion site. This was too small for PCI.  Cath 07/21/2021 showed widely patent LIMA to LAD graft and L MCA stent, patent mid RCA stents with ~40% restenosis, moderately reduced LVEF 40 to 45%. Not significantly changed from prior catheterization 12/2020.  There were no targets for intervention and aggressive secondary prevention was recommended.  Patient was last seen by Dr. [name] on 08/29/2021, stable at that time from cardiac standpoint, no changes made to management.  Cardiology clearance per telephone encounter 01/23/2019., "Chartrevisitedas part of pre-operative protocol coverage. Per Dr. [name], "Is a low risk procedure she can stop clopidogrel 5 to 7 days. Like to see her remain on aspirin. No further preoperative evaluation is  needed."We will defer to surgeon to review Dr. Joya Gaskins recommendation and for them to relay final instructions on blood thinner to patient. I reached out to patient for update on howsheis doing. The patient affirms shehas not hadany new cardiac symptoms. The patient was  advised that ifshedevelops new symptoms prior to surgery to contact our office to arrange for a follow-up visit, and sheverbalized understanding."  Patient reported last dose Plavix 01/27/2022.  Follows with vascular surgery for history of PAD, s/p left anterior tibial artery angioplasty 10/11/2021.  Patient was admitted December 2022 for sepsis and left diabetic foot infection in the setting of recent dilatation and debridement. MRI foot noted findings suspicious for osteomyelitis involving first metatarsal, second and third metatarsal heads and the second and third proximal phalanges, myositis. Underwent transmetatarsal amputation 12/8.  Echo showed preserved EF, no vegetations noted, small, highly echogenic, well circumscribed mass measuring 0.6x1.0cm on the [place] most consistent with asymmetric calcification. This was also present on prior study and unlikely to represent a vegetation.  TEE was negative for endocarditis.  I was contacted by surgical scheduler at Dr. Leeanne Rio office and notified the patient's PCP did not clear the patient for surgery due to history of uncontrolled insulin-dependent DM2.  I advised that the patient does have cardiac clearance and I do not see uncontrolled diabetes as a reason to postpone investigation of osteomyelitis.  I made clear that markedly uncontrolled blood sugar on day of surgery could preclude the provision of safe anesthesia.  Last A1c in epic 10.7 on 11/23/2021.  Patient will need day of surgery labs and evaluation.  EKG 08/15/2021: Sinus rhythm with first-degree AV block.  Rate 60.  Rightward axis.  Low voltage QRS.  Cannot rule out anteroseptal infarct, age undetermined.  Not significantly changed from prior.  TEE 11/27/2021: 1. Device lead seen in RA, RV without obvious vegetations.  2. No obvious vegetations seen.  3. Left ventricular ejection fraction, by estimation, is 50 to 55%. The  left ventricle has low normal function.  4. Right  ventricular systolic function is normal. The right ventricular  size is normal.  5. No left atrial/left atrial appendage thrombus was detected.  6. The mitral valve is normal in structure. Trivial mitral valve  regurgitation.  7. The aortic valve is tricuspid. Aortic valve regurgitation is not  visualized. Aortic valve sclerosis is present, with no evidence of aortic  valve stenosis.  8. Evidence of atrial level shunting detected by color flow Doppler.  Agitated saline contrast bubble study was positive with shunting observed  within 3-6 cardiac cycles suggestive of interatrial shunt.   TTE 11/23/2021: 1. Left ventricular ejection fraction, by estimation, is 50 to 55%. The  left ventricle has low normal function. The LV endocardium is incompletely  visualized and it is difficult to assess wall motion. Based on available  views, the mid-to-apical inferior  and apical inferoseptal walls are mildly hypokinetic. There is mild  concentric left ventricular hypertrophy. Left ventricular diastolic  parameters are consistent with Grade I diastolic dysfunction (impaired  relaxation).  2. Right ventricular systolic function is normal. The right ventricular  size is normal. There is normal pulmonary artery systolic pressure.  3. The mitral valve is normal in structure. Trivial mitral valve  regurgitation. No evidence of mitral stenosis.  4. The aortic valve is tricuspid. There is mild-to-moderate calcification  of the aortic valve. There is mild thickening of the aortic valve. Aortic  valve regurgitation is not visualized. Aortic valve  sclerosis/calcification is present, without  any  evidence of aortic stenosis.  5. There is a small, highly echogenic, well circumscribed mass measuring  0.6x1.0cm on the [place] most consistent with asymmetric calcification. This  was also present on prior study and unlikely to represent a vegetation.  6. No valvular vegetations visualized. Consider TEE to  exclude infective  endocarditis if clinically indicated.   Conclusion(s)/Recommendation(s): No evidence of valvular vegetations on  this transthoracic echocardiogram. Consider a transesophageal  echocardiogram to exclude infective endocarditis if clinically indicated.   Cath 07/21/2021: Conclusions: 1. Severe native coronary artery disease, as detailed below, not significantly changed from prior catheterization in 12/2020. 2. Widely patent LIMA-LAD graft and LMCA stent. 3. Patent mid RCA stents with moderate restenosis (~40%). 4. Moderately reduced left ventricular systolic function (LVEF 37-10%). 5. Upper normal left ventricular filling pressure (LVEDP 15 mmHg).  Recommendations: 1. Escalate antianginal therapy; no focal targets again noted on today's catheterization. 2. Aggressive secondary prevention.    )      Anesthesia Quick Evaluation

## 2022-02-01 ENCOUNTER — Ambulatory Visit (HOSPITAL_COMMUNITY): Payer: Managed Care, Other (non HMO) | Admitting: Physician Assistant

## 2022-02-01 ENCOUNTER — Encounter: Payer: Self-pay | Admitting: Sports Medicine

## 2022-02-01 ENCOUNTER — Ambulatory Visit (HOSPITAL_COMMUNITY)
Admission: RE | Admit: 2022-02-01 | Discharge: 2022-02-01 | Disposition: A | Discharge status: Home / Self Care | Payer: Managed Care, Other (non HMO) | Attending: Sports Medicine | Admitting: Sports Medicine

## 2022-02-01 ENCOUNTER — Other Ambulatory Visit: Payer: Self-pay

## 2022-02-01 ENCOUNTER — Encounter (HOSPITAL_COMMUNITY): Payer: Self-pay | Admitting: Sports Medicine

## 2022-02-01 ENCOUNTER — Encounter (HOSPITAL_COMMUNITY)
Admission: RE | Disposition: A | Discharge status: Home / Self Care | Payer: Self-pay | Source: Home / Self Care | Attending: Sports Medicine

## 2022-02-01 ENCOUNTER — Ambulatory Visit (HOSPITAL_BASED_OUTPATIENT_CLINIC_OR_DEPARTMENT_OTHER): Payer: Managed Care, Other (non HMO) | Admitting: Physician Assistant

## 2022-02-01 DIAGNOSIS — I2510 Atherosclerotic heart disease of native coronary artery without angina pectoris: Secondary | ICD-10-CM | POA: Diagnosis not present

## 2022-02-01 DIAGNOSIS — J449 Chronic obstructive pulmonary disease, unspecified: Secondary | ICD-10-CM | POA: Diagnosis not present

## 2022-02-01 DIAGNOSIS — Y835 Amputation of limb(s) as the cause of abnormal reaction of the patient, or of later complication, without mention of misadventure at the time of the procedure: Secondary | ICD-10-CM | POA: Insufficient documentation

## 2022-02-01 DIAGNOSIS — Z9889 Other specified postprocedural states: Secondary | ICD-10-CM | POA: Insufficient documentation

## 2022-02-01 DIAGNOSIS — Z951 Presence of aortocoronary bypass graft: Secondary | ICD-10-CM | POA: Insufficient documentation

## 2022-02-01 DIAGNOSIS — T8189XA Other complications of procedures, not elsewhere classified, initial encounter: Secondary | ICD-10-CM | POA: Insufficient documentation

## 2022-02-01 DIAGNOSIS — Z7984 Long term (current) use of oral hypoglycemic drugs: Secondary | ICD-10-CM | POA: Diagnosis not present

## 2022-02-01 DIAGNOSIS — I10 Essential (primary) hypertension: Secondary | ICD-10-CM | POA: Diagnosis not present

## 2022-02-01 DIAGNOSIS — E785 Hyperlipidemia, unspecified: Secondary | ICD-10-CM | POA: Insufficient documentation

## 2022-02-01 DIAGNOSIS — T8789 Other complications of amputation stump: Secondary | ICD-10-CM | POA: Insufficient documentation

## 2022-02-01 DIAGNOSIS — T8744 Infection of amputation stump, left lower extremity: Secondary | ICD-10-CM | POA: Diagnosis present

## 2022-02-01 DIAGNOSIS — M86672 Other chronic osteomyelitis, left ankle and foot: Secondary | ICD-10-CM | POA: Insufficient documentation

## 2022-02-01 DIAGNOSIS — T8781 Dehiscence of amputation stump: Secondary | ICD-10-CM | POA: Insufficient documentation

## 2022-02-01 DIAGNOSIS — E1169 Type 2 diabetes mellitus with other specified complication: Secondary | ICD-10-CM | POA: Diagnosis not present

## 2022-02-01 DIAGNOSIS — E1151 Type 2 diabetes mellitus with diabetic peripheral angiopathy without gangrene: Secondary | ICD-10-CM

## 2022-02-01 DIAGNOSIS — L97522 Non-pressure chronic ulcer of other part of left foot with fat layer exposed: Secondary | ICD-10-CM | POA: Diagnosis not present

## 2022-02-01 DIAGNOSIS — E1142 Type 2 diabetes mellitus with diabetic polyneuropathy: Secondary | ICD-10-CM | POA: Insufficient documentation

## 2022-02-01 DIAGNOSIS — Z955 Presence of coronary angioplasty implant and graft: Secondary | ICD-10-CM | POA: Diagnosis not present

## 2022-02-01 DIAGNOSIS — Z89432 Acquired absence of left foot: Secondary | ICD-10-CM | POA: Insufficient documentation

## 2022-02-01 DIAGNOSIS — E1165 Type 2 diabetes mellitus with hyperglycemia: Secondary | ICD-10-CM | POA: Insufficient documentation

## 2022-02-01 DIAGNOSIS — M86172 Other acute osteomyelitis, left ankle and foot: Secondary | ICD-10-CM | POA: Insufficient documentation

## 2022-02-01 DIAGNOSIS — Z794 Long term (current) use of insulin: Secondary | ICD-10-CM | POA: Diagnosis not present

## 2022-02-01 HISTORY — DX: Presence of automatic (implantable) cardiac defibrillator: Z95.810

## 2022-02-01 HISTORY — PX: BONE BIOPSY: SHX375

## 2022-02-01 LAB — SURGICAL PCR SCREEN
MRSA, PCR: NEGATIVE
Staphylococcus aureus: NEGATIVE

## 2022-02-01 LAB — BASIC METABOLIC PANEL
Anion gap: 11 (ref 5–15)
BUN: 18 mg/dL (ref 8–23)
CO2: 19 mmol/L — ABNORMAL LOW (ref 22–32)
Calcium: 10.7 mg/dL — ABNORMAL HIGH (ref 8.9–10.3)
Chloride: 105 mmol/L (ref 98–111)
Creatinine, Ser: 1.04 mg/dL — ABNORMAL HIGH (ref 0.44–1.00)
GFR, Estimated: >60 mL/min (ref >60)
Glucose, Bld: 249 mg/dL — ABNORMAL HIGH (ref 70–99)
Potassium: 4.7 mmol/L (ref 3.5–5.1)
Sodium: 135 mmol/L (ref 135–145)

## 2022-02-01 LAB — GLUCOSE, CAPILLARY
Glucose-Capillary: 252 mg/dL — ABNORMAL HIGH (ref 70–99)
Glucose-Capillary: 251 mg/dL — ABNORMAL HIGH (ref 70–99)
Glucose-Capillary: 206 mg/dL — ABNORMAL HIGH (ref 70–99)

## 2022-02-01 LAB — CBC
HCT: 36.3 % (ref 36.0–46.0)
Hemoglobin: 12 g/dL (ref 12.0–15.0)
MCH: 28.7 pg (ref 26.0–34.0)
MCHC: 33.1 g/dL (ref 30.0–36.0)
MCV: 86.8 fL (ref 80.0–100.0)
Platelets: 303 10*3/uL (ref 150–400)
RBC: 4.18 MIL/uL (ref 3.87–5.11)
RDW: 14.6 % (ref 11.5–15.5)
WBC: 7.7 10*3/uL (ref 4.0–10.5)
nRBC: 0 % (ref 0.0–0.2)

## 2022-02-01 SURGERY — BIOPSY, BONE
Anesthesia: Monitor Anesthesia Care | Laterality: Left

## 2022-02-01 MED ORDER — PROPOFOL 500 MG/50ML IV EMUL
INTRAVENOUS | Status: DC | PRN
  Administered 2022-02-01: 75 ug/kg/min via INTRAVENOUS

## 2022-02-01 MED ORDER — OXYCODONE HCL 5 MG PO TABS: 5.0000 mg | ORAL_TABLET | Freq: Once | ORAL | Status: DC | PRN

## 2022-02-01 MED ORDER — PROMETHAZINE HCL 25 MG PO TABS
25.0000 mg | ORAL_TABLET | Freq: Four times a day (QID) | ORAL | 0 refills | Status: DC | PRN
Start: 2022-02-01 — End: 2022-04-25

## 2022-02-01 MED ORDER — CEFAZOLIN SODIUM-DEXTROSE 2-4 GM/100ML-% IV SOLN
INTRAVENOUS | Status: AC
  Filled 2022-02-01: qty 100

## 2022-02-01 MED ORDER — HYDROMORPHONE HCL 4 MG PO TABS
4.0000 mg | ORAL_TABLET | Freq: Four times a day (QID) | ORAL | 0 refills | Status: AC | PRN
Start: 2022-02-01 — End: 2022-02-06

## 2022-02-01 MED ORDER — INSULIN ASPART 100 UNIT/ML IJ SOLN
INTRAMUSCULAR | Status: AC
  Filled 2022-02-01: qty 1

## 2022-02-01 MED ORDER — ONDANSETRON HCL 4 MG/2ML IJ SOLN
INTRAMUSCULAR | Status: DC | PRN
  Administered 2022-02-01: 4 mg via INTRAVENOUS

## 2022-02-01 MED ORDER — FENTANYL CITRATE (PF) 250 MCG/5ML IJ SOLN
INTRAMUSCULAR | Status: AC
  Filled 2022-02-01: qty 5

## 2022-02-01 MED ORDER — ACETAMINOPHEN 500 MG PO TABS
1000.0000 mg | ORAL_TABLET | Freq: Once | ORAL | Status: AC
  Administered 2022-02-01: 1000 mg via ORAL
  Filled 2022-02-01: qty 2

## 2022-02-01 MED ORDER — DOCUSATE SODIUM 100 MG PO CAPS: 100.0000 mg | ORAL_CAPSULE | Freq: Two times a day (BID) | ORAL | 0 refills | Status: DC

## 2022-02-01 MED ORDER — FENTANYL CITRATE (PF) 100 MCG/2ML IJ SOLN: 25.0000 ug | INTRAMUSCULAR | Status: DC | PRN

## 2022-02-01 MED ORDER — 0.9 % SODIUM CHLORIDE (POUR BTL) OPTIME
TOPICAL | Status: DC | PRN
Start: 2022-02-01 — End: 2022-02-01
  Administered 2022-02-01: 1000 mL

## 2022-02-01 MED ORDER — CEFAZOLIN SODIUM-DEXTROSE 2-4 GM/100ML-% IV SOLN
2.0000 g | INTRAVENOUS | Status: AC
  Administered 2022-02-01: 2 g via INTRAVENOUS

## 2022-02-01 MED ORDER — MEPERIDINE HCL 25 MG/ML IJ SOLN: 6.2500 mg | INTRAMUSCULAR | Status: DC | PRN

## 2022-02-01 MED ORDER — LIDOCAINE HCL (PF) 1 % IJ SOLN
INTRAMUSCULAR | Status: AC
  Filled 2022-02-01: qty 30

## 2022-02-01 MED ORDER — BUPIVACAINE HCL (PF) 0.25 % IJ SOLN
INTRAMUSCULAR | Status: AC
  Filled 2022-02-01: qty 30

## 2022-02-01 MED ORDER — LIDOCAINE HCL (PF) 1 % IJ SOLN
INTRAMUSCULAR | Status: DC | PRN
  Administered 2022-02-01: 20 mL

## 2022-02-01 MED ORDER — ONDANSETRON HCL 4 MG/2ML IJ SOLN
INTRAMUSCULAR | Status: AC
  Filled 2022-02-01: qty 2

## 2022-02-01 MED ORDER — AMOXICILLIN-POT CLAVULANATE 875-125 MG PO TABS: 1.0000 | ORAL_TABLET | Freq: Two times a day (BID) | ORAL | 0 refills | Status: AC

## 2022-02-01 MED ORDER — MIDAZOLAM HCL 2 MG/2ML IJ SOLN: 0.5000 mg | Freq: Once | INTRAMUSCULAR | Status: DC | PRN

## 2022-02-01 MED ORDER — OXYCODONE HCL 5 MG/5ML PO SOLN: 5.0000 mg | Freq: Once | ORAL | Status: DC | PRN

## 2022-02-01 MED ORDER — FENTANYL CITRATE (PF) 100 MCG/2ML IJ SOLN
INTRAMUSCULAR | Status: DC | PRN
  Administered 2022-02-01: 50 ug via INTRAVENOUS

## 2022-02-01 MED ORDER — PROMETHAZINE HCL 25 MG/ML IJ SOLN: 6.2500 mg | INTRAMUSCULAR | Status: DC | PRN

## 2022-02-01 MED ORDER — INSULIN ASPART 100 UNIT/ML IJ SOLN
0.0000 [IU] | INTRAMUSCULAR | Status: AC | PRN
  Administered 2022-02-01 (×2): 6 [IU] via SUBCUTANEOUS

## 2022-02-01 MED ORDER — BUPIVACAINE HCL (PF) 0.25 % IJ SOLN
INTRAMUSCULAR | Status: DC | PRN
Start: 2022-02-01 — End: 2022-02-01
  Administered 2022-02-01: 20 mL

## 2022-02-01 MED ORDER — PROPOFOL 10 MG/ML IV BOLUS
INTRAVENOUS | Status: DC | PRN
Start: 2022-02-01 — End: 2022-02-01
  Administered 2022-02-01: 20 mg via INTRAVENOUS
  Administered 2022-02-01: 30 mg via INTRAVENOUS
  Administered 2022-02-01 (×2): 20 mg via INTRAVENOUS

## 2022-02-01 MED ORDER — LACTATED RINGERS IV SOLN: INTRAVENOUS | Status: DC

## 2022-02-01 SURGICAL SUPPLY — 57 items
APL PRP STRL LF DISP 70% ISPRP (MISCELLANEOUS) ×1
BAG COUNTER SPONGE SURGICOUNT (BAG) ×2 IMPLANT
BAG SPNG CNTER NS LX DISP (BAG) ×1
BANDAGE ESMARK 6X9 LF (GAUZE/BANDAGES/DRESSINGS) ×1 IMPLANT
BLADE SURG 10 STRL SS (BLADE) ×2 IMPLANT
BNDG CMPR 9X6 STRL LF SNTH (GAUZE/BANDAGES/DRESSINGS)
BNDG COHESIVE 4X5 TAN ST LF (GAUZE/BANDAGES/DRESSINGS) ×1 IMPLANT
BNDG COHESIVE 4X5 TAN STRL (GAUZE/BANDAGES/DRESSINGS) ×1 IMPLANT
BNDG COHESIVE 6X5 TAN STRL LF (GAUZE/BANDAGES/DRESSINGS) ×1 IMPLANT
BNDG ELASTIC 4X5.8 VLCR STR LF (GAUZE/BANDAGES/DRESSINGS) ×1 IMPLANT
BNDG ESMARK 6X9 LF (GAUZE/BANDAGES/DRESSINGS)
BNDG GAUZE ELAST 4 BULKY (GAUZE/BANDAGES/DRESSINGS) ×1 IMPLANT
CANISTER SUCT 3000ML PPV (MISCELLANEOUS) ×2 IMPLANT
CHLORAPREP W/TINT 26 (MISCELLANEOUS) ×2 IMPLANT
COVER SURGICAL LIGHT HANDLE (MISCELLANEOUS) ×2 IMPLANT
CUFF TOURN SGL QUICK 34 (TOURNIQUET CUFF) ×2
CUFF TOURN SGL QUICK 42 (TOURNIQUET CUFF) IMPLANT
CUFF TRNQT CYL 34X4.125X (TOURNIQUET CUFF) ×1 IMPLANT
DRAPE OEC MINIVIEW 54X84 (DRAPES) ×1 IMPLANT
DRAPE U-SHAPE 47X51 STRL (DRAPES) ×2 IMPLANT
DRSG ADAPTIC 3X8 NADH LF (GAUZE/BANDAGES/DRESSINGS) IMPLANT
DRSG EMULSION OIL 3X3 NADH (GAUZE/BANDAGES/DRESSINGS) ×1 IMPLANT
DRSG PAD ABDOMINAL 8X10 ST (GAUZE/BANDAGES/DRESSINGS) ×1 IMPLANT
ELECT REM PT RETURN 9FT ADLT (ELECTROSURGICAL) ×2
ELECTRODE REM PT RTRN 9FT ADLT (ELECTROSURGICAL) ×1 IMPLANT
GAUZE SPONGE 4X4 12PLY STRL (GAUZE/BANDAGES/DRESSINGS) ×1 IMPLANT
GLOVE SRG 8 PF TXTR STRL LF DI (GLOVE) ×1 IMPLANT
GLOVE SURG ENC MOIS LTX SZ7 (GLOVE) ×4 IMPLANT
GLOVE SURG ENC MOIS LTX SZ8 (GLOVE) ×2 IMPLANT
GLOVE SURG UNDER POLY LF SZ7.5 (GLOVE) ×2 IMPLANT
GLOVE SURG UNDER POLY LF SZ8 (GLOVE) ×2
GOWN STRL REUS W/ TWL LRG LVL3 (GOWN DISPOSABLE) ×2 IMPLANT
GOWN STRL REUS W/ TWL XL LVL3 (GOWN DISPOSABLE) ×1 IMPLANT
GOWN STRL REUS W/TWL LRG LVL3 (GOWN DISPOSABLE) ×4
GOWN STRL REUS W/TWL XL LVL3 (GOWN DISPOSABLE) ×2
KIT BASIN OR (CUSTOM PROCEDURE TRAY) ×2 IMPLANT
KIT TURNOVER KIT B (KITS) ×2 IMPLANT
NEEDLE 22X1 1/2 (OR ONLY) (NEEDLE) IMPLANT
NS IRRIG 1000ML POUR BTL (IV SOLUTION) ×2 IMPLANT
PACK ORTHO EXTREMITY (CUSTOM PROCEDURE TRAY) ×2 IMPLANT
PAD ARMBOARD 7.5X6 YLW CONV (MISCELLANEOUS) ×4 IMPLANT
PAD CAST 4YDX4 CTTN HI CHSV (CAST SUPPLIES) ×1 IMPLANT
PADDING CAST COTTON 4X4 STRL (CAST SUPPLIES) ×2
SOAP 2 % CHG 4 OZ (WOUND CARE) ×2 IMPLANT
STAPLER VISISTAT 35W (STAPLE) IMPLANT
SUCTION FRAZIER HANDLE 10FR (MISCELLANEOUS) ×2
SUCTION TUBE FRAZIER 10FR DISP (MISCELLANEOUS) ×1 IMPLANT
SUT PROLENE 3 0 PS 2 (SUTURE) ×2 IMPLANT
SUT VIC AB 2-0 CT1 27 (SUTURE) ×2
SUT VIC AB 2-0 CT1 TAPERPNT 27 (SUTURE) ×2 IMPLANT
SUT VIC AB 3-0 PS2 18 (SUTURE) ×2
SUT VIC AB 3-0 PS2 18XBRD (SUTURE) ×1 IMPLANT
SYR CONTROL 10ML LL (SYRINGE) IMPLANT
TOWEL GREEN STERILE (TOWEL DISPOSABLE) ×2 IMPLANT
TOWEL GREEN STERILE FF (TOWEL DISPOSABLE) ×2 IMPLANT
TUBE CONNECTING 12X1/4 (SUCTIONS) ×2 IMPLANT
WATER STERILE IRR 1000ML POUR (IV SOLUTION) ×2 IMPLANT

## 2022-02-01 NOTE — Brief Op Note (Signed)
02/01/2022  3:51 PM  PATIENT:  Darleen Crocker  61 y.o. female  PRE-OPERATIVE DIAGNOSIS:  OSTEOMYELITIS OF LEFT FOOT  POST-OPERATIVE DIAGNOSIS:  OSTEOMYELITIS OF LEFT FOOT  PROCEDURE:  Procedure(s): BONE BIOPSY LEFT FOOT, IRRIGATION AND DEBRIDEMENT (Left)  SURGEON:  Surgeon(s) and Role:    * Landis Martins, DPM - Primary  PHYSICIAN ASSISTANT:   ASSISTANTS: none   ANESTHESIA:   MAC  EBL:  30cc   BLOOD ADMINISTERED:none  DRAINS: none   LOCAL MEDICATIONS USED:  MARCAINE    and XYLOCAINE   SPECIMEN:  Source of Specimen:  Deep wound culture swab of amputation stump, bone from 1-5 metatarsal   DISPOSITION OF SPECIMEN:  PATHOLOGY  COUNTS:  YES  TOURNIQUET:   Total Tourniquet Time Documented: Calf (Left) - 60 minutes Total: Calf (Left) - 60 minutes   DICTATION: .Note written in EPIC  PLAN OF CARE: Discharge to home after PACU  PATIENT DISPOSITION:  PACU - hemodynamically stable.   Delay start of Pharmacological VTE agent (>24hrs) due to surgical blood loss or risk of bleeding: no

## 2022-02-01 NOTE — Op Note (Signed)
DATE: 02/01/2022 SURGEON: Landis Martins, DPM  PREOPERATIVE DIAGNOSIS:  Left TMA amputation stump site infection and possible osteomyelitis and delayed surgical wound healing with dehiscence at the medial and lateral aspect of the incision  POSTOPERATIVE DIAGNOSIS: Same  PROCEDURE PERFORMED: Left foot irrigation and debridement with bone biopsy of metatarsals 1 through 5 and primary closure of stump site  HEMOSTASIS:  Left ankle tourniquet  ESTIMATED BLOOD LOSS: 30 cc  ANESTHESIA:  MAC with local 20cc of 1:1 mixture 1% lidocaine and 0.25% marcaine plain pre and postoperative for a total of 40 cc  SPECIMENS: Bone and soft tissue- path left foot and deep wound culture swab-micro  COMPLICATIONS:  None.  INDICATIONS FOR PROCEDURE:  This patient is a pleasant 61 y.o. diabetic female who has been seen for delayed wound healing and management of amputation stump site infection of the left foot patient had MRI as requested by infectious disease that showed possible postsurgical marrow edema versus residual osteomyelitis.  Because patient has been dealing with infection and multiple debridement and amputation surgeries in order to salvage the foot it was determined that bone biopsy to confirm or deny infection was most appropriate in the setting of already completing 6 weeks of IV antibiotics.  Patient consents for above procedure.  Risks and complications include but are not limited to infection, recurrence of symptoms, pain, numbness, wound dehiscence, delayed healing, as well as need for future surgery/further amputation/BKA.  No guarantees were given or applied.  All questions were answered to the patients satisfaction, and the patient has consented to the above procedure.  All preoperative labs and H&P, clearances have been obtained as she is currently inpatient and NPO status past midnight has been confirmed.   PREPARATION FOR PROCEDURE:  The patient was brought to the operating room and placed on  the operating table in supine position.  A pneumatic ankle tourniquet was placed about the patient's Left ankle but not yet inflated.  After the department of anesthesia had administered MAC anesthesia. A local block was administered and the Left ankle and foot was then scrubbed, prepped, and draped in the usual aseptic manner.  An Esmarch bandage was utilized to exsanguinate the patients Left foot and leg, and the pneumatic tourniquet was inflated to 250 mmHg.  PROCEDURE IN DETAIL:  Attention was then directed to the left foot at TMA stump site where there is of note a medial and lateral dehiscence.  Utilizing a 15 blade a elliptical incision was made to excise out the stump site dehiscence wound and this incision was carried across the dorsal aspect of the stump site at the area of previous incision down to the level of bone. The incision was made in the routine fashion, single layer down to the level of bone. When doing so, there was no purulence noted at the incision site. The incision was carried deep down to the level of the first through fifth metatarals stumps then utilizing cautery and Bovie any bleeders were ligated as necessary.  Then utilizing a oscillating saw a transverse transection was made of the distal aspects of the metatarsal stumps to be sent to pathology as specimen and a stepwise approach to the first and through fifth metatarsals, the specimen was removed from the operative field and placed on the back stable to be sent for pathologic analysis.  The remaining metatarsal bones appear to be healthy and viable no signs of soft tissue or bony necrosis, the amputation flaps were then irrigated with saline solution, then a  deep wound culture swab was obtained from and the soft tissue flaps, the flexor and extensor tendons were dissected as proximally as visible and transected at that level using a 15 blade. The operative site was then irrigated utilizing normal sterile saline. Also, bleeders  were bovied as necessary. Skin closure was then obtained utilizing 2-0 vicryl and 3-0 Vicryl, 3-0 nylon, 0 Prolene and Staples in combination of simple interrupted suture fashion.  The left foot was then dressed with betadine overlying the suture sites, Adaptic, 4 x 4 gauze, abd, Kerlix, web roll, Coban and ACE.  At this time, the Left pneumatic tourniquet was deflated, and a positive hyperemic response was noted to the flap. The patient tolerated the procedure and anesthesia well.  Upon transfer to the recovery room, the patients vital signs were stable, and neurovascular status was intact.    Patient to discharge home with oral antibiotics for active UTI, pain medicine, and additional PRN meds.  Landis Martins, DPM

## 2022-02-01 NOTE — Anesthesia Postprocedure Evaluation (Signed)
Anesthesia Post Note  Patient: [name]  Procedure(s) Performed: BONE BIOPSY LEFT FOOT, IRRIGATION AND DEBRIDEMENT (Left)     Patient location during evaluation: PACU Anesthesia Type: MAC Level of consciousness: awake Pain management: pain level controlled Vital Signs Assessment: post-procedure vital signs reviewed and stable Respiratory status: spontaneous breathing Cardiovascular status: stable Postop Assessment: no apparent nausea or vomiting Anesthetic complications: no   No notable events documented.  Last Vitals:  Vitals:   02/01/22 1550 02/01/22 1605  BP: 127/86 130/71  Pulse: 61 62  Resp: 17 20  Temp:  36.6 C  SpO2: 100% 98%    Last Pain:  Vitals:   02/01/22 1605  PainSc: 3                  Kiesha Ensey

## 2022-02-01 NOTE — Discharge Instructions (Signed)
Attached to paper chart 

## 2022-02-01 NOTE — Transfer of Care (Signed)
Immediate Anesthesia Transfer of Care Note  Patient: Jasmine Richardson  Procedure(s) Performed: BONE BIOPSY LEFT FOOT, IRRIGATION AND DEBRIDEMENT (Left)  Patient Location: PACU  Anesthesia Type:MAC  Level of Consciousness: drowsy and patient cooperative  Airway & Oxygen Therapy: Patient Spontanous Breathing and Patient connected to nasal cannula oxygen  Post-op Assessment: Report given to RN, Post -op Vital signs reviewed and stable and Patient moving all extremities  Post vital signs: Reviewed and stable  Last Vitals:  Vitals Value Taken Time  BP 124/70 02/01/22 1534  Temp    Pulse 62 02/01/22 1536  Resp 16 02/01/22 1536  SpO2 99 % 02/01/22 1536  Vitals shown include unvalidated device data.  Last Pain:  Vitals:   02/01/22 1204  PainSc: 0-No pain         Complications: No notable events documented.

## 2022-02-01 NOTE — Anesthesia Procedure Notes (Signed)
Procedure Name: MAC Date/Time: 02/01/2022 2:05 PM Performed by: Moshe Salisbury, CRNA Pre-anesthesia Checklist: Patient identified, Emergency Drugs available, Suction available and Patient being monitored Patient Re-evaluated:Patient Re-evaluated prior to induction Oxygen Delivery Method: Circle system utilized Preoxygenation: Pre-oxygenation with 100% oxygen Placement Confirmation: positive ETCO2 Dental Injury: Teeth and Oropharynx as per pre-operative assessment

## 2022-02-01 NOTE — Telephone Encounter (Signed)
Called patient, no answer, left vmessage concerning Dr Leeanne Rio message about supplies w/ prism.

## 2022-02-01 NOTE — H&P (Signed)
Brief history and physical  Patient seen at bedside for preoperative evaluation in the preop holding area.  Patient confirms n.p.o. since midnight and has been consented for left foot bone biopsy at metatarsals 1 through 5 with primary closure at amputation stump site for possible residual osteomyelitis.  Chart and labs reviewed it is of note patient has a current elevation on her urinalysis white blood cells consistent with UTI. Plan postoperatively will be to resume oral antibiotics for UTI and we will adjust regimen as needed for foot infection however at this time there is no active infection we will await bone biopsy results to determine if she needs any additional antibiotics for this concern. We will consult ID again for further recs outpatient if biopsy is abnormal.  All risks alternatives benefits explained no guarantees given or implied.  Patient to undergo above procedure.  To meet with representative family after surgery plan is for patient to be discharged home after surgery today given no complications.  Dr. Landis Martins [facility] [phone] office [phone] cell Available via secure chat

## 2022-02-02 ENCOUNTER — Encounter (HOSPITAL_COMMUNITY): Payer: Self-pay | Admitting: Sports Medicine

## 2022-02-02 ENCOUNTER — Ambulatory Visit (INDEPENDENT_AMBULATORY_CARE_PROVIDER_SITE_OTHER): Payer: Managed Care, Other (non HMO) | Admitting: Podiatry

## 2022-02-02 DIAGNOSIS — Z89432 Acquired absence of left foot: Secondary | ICD-10-CM

## 2022-02-02 DIAGNOSIS — L97522 Non-pressure chronic ulcer of other part of left foot with fat layer exposed: Secondary | ICD-10-CM

## 2022-02-02 DIAGNOSIS — E1165 Type 2 diabetes mellitus with hyperglycemia: Secondary | ICD-10-CM

## 2022-02-06 ENCOUNTER — Encounter: Payer: Managed Care, Other (non HMO) | Admitting: Sports Medicine

## 2022-02-06 LAB — SURGICAL PATHOLOGY

## 2022-02-06 LAB — AEROBIC/ANAEROBIC CULTURE W GRAM STAIN (SURGICAL/DEEP WOUND)

## 2022-02-07 ENCOUNTER — Encounter: Payer: Self-pay | Admitting: Sports Medicine

## 2022-02-07 ENCOUNTER — Ambulatory Visit (INDEPENDENT_AMBULATORY_CARE_PROVIDER_SITE_OTHER): Payer: Managed Care, Other (non HMO)

## 2022-02-07 ENCOUNTER — Telehealth: Payer: Self-pay | Admitting: Cardiology

## 2022-02-07 ENCOUNTER — Ambulatory Visit (INDEPENDENT_AMBULATORY_CARE_PROVIDER_SITE_OTHER): Payer: Managed Care, Other (non HMO) | Admitting: Sports Medicine

## 2022-02-07 DIAGNOSIS — Z89439 Acquired absence of unspecified foot: Secondary | ICD-10-CM

## 2022-02-07 DIAGNOSIS — M79672 Pain in left foot: Secondary | ICD-10-CM

## 2022-02-07 DIAGNOSIS — M86672 Other chronic osteomyelitis, left ankle and foot: Secondary | ICD-10-CM

## 2022-02-07 DIAGNOSIS — Z89432 Acquired absence of left foot: Secondary | ICD-10-CM

## 2022-02-07 DIAGNOSIS — Z87440 Personal history of urinary (tract) infections: Secondary | ICD-10-CM

## 2022-02-07 DIAGNOSIS — E1165 Type 2 diabetes mellitus with hyperglycemia: Secondary | ICD-10-CM

## 2022-02-07 DIAGNOSIS — L97522 Non-pressure chronic ulcer of other part of left foot with fat layer exposed: Secondary | ICD-10-CM

## 2022-02-07 MED ORDER — METOPROLOL SUCCINATE ER 25 MG PO TB24: 25.0000 mg | ORAL_TABLET | Freq: Three times a day (TID) | ORAL | 9 refills | Status: DC

## 2022-02-07 NOTE — Progress Notes (Signed)
Subjective:  [name], female    DOB: 07-23-1961,  MRN: [id number]  Chief Complaint  Patient presents with   Post-op Problem     SURGERY YESTERDAY/DR [place]/BLEEDING THROUGH BANDAGE    DOS: 02/01/2022 Procedure: Left transmetatarsal amputation/revision of with biopsy of 1 through 5  61 y.o. female returns for post-op check.  Patient states that it is bleeding on the side.  Patient is here to get it eval make sure that there is nothing else going on.  She stepped on it the wrong way while transferring that could have led to the bleeding through the bandage.  I discussed with the patient to put most of your weight to the heel when transferring.  She states understanding.  Review of Systems: Negative except as noted in the HPI. Denies N/V/F/Ch.  Past Medical History:  Diagnosis Date   Acute chest pain 05/08/2016   AICD (automatic cardioverter/defibrillator) present    St Jude/Abbott device   Angina pectoris ([place]) 09/11/2017   Burping 05/08/2016   Cancer ([place])    cervical - hysterectomy   Chronic diastolic heart failure ([place]) 11/06/2016   Coronary artery disease involving native coronary artery of native heart with angina pectoris ([place]) 09/12/2015   Overview:  PCI and stent of RCA 2009, last cath 2012 with medical therapy  CABG May 2017   Emphysema lung ([place]) 09/11/2017   patient not aware of this dx   Essential hypertension 09/12/2015   GERD (gastroesophageal reflux disease) 05/08/2016   Hyperlipidemia 09/12/2015   Mediastinitis 06/28/2016   Morbid obesity ([place]) 05/08/2016   Myocardial infarction [facility])    Peripheral vascular disease ([place])    S/P CABG (coronary artery bypass graft) 06/03/2016   Overview:  The patient underwent sternal reconstruction on 06/21/16 with pec flaps for mediastinitis from a prior CABG in May 2017. On admission, she was critically ill from sepsis and had altered mental status. She was last seen in clinic on 08/09/16 at which time she was  doing well.   Severe sepsis ([place]) 06/03/2016   Sinus tachycardia 05/08/2016   Tobacco use disorder 04/20/2016   Overview:  Quit in May 2017.   Uncontrolled type 2 diabetes mellitus 05/08/2016   Wound, surgical, infected 06/06/2016   Overview:  sternal    Current Outpatient Medications:    acetaminophen (TYLENOL) 325 MG tablet, Take 2 tablets (650 mg total) by mouth every 4 (four) hours as needed for headache or mild pain., Disp: , Rfl:    amiodarone (PACERONE) 200 MG tablet, Take 200 mg by mouth daily., Disp: , Rfl:    amoxicillin-clavulanate (AUGMENTIN) 875-125 MG tablet, Take 1 tablet by mouth 2 (two) times daily for 14 days., Disp: 28 tablet, Rfl: 0   aspirin EC 81 MG tablet, Take 81 mg by mouth daily. Swallow whole., Disp: , Rfl:    clopidogrel (PLAVIX) 75 MG tablet, Take 75 mg by mouth daily., Disp: , Rfl:    dapagliflozin propanediol (FARXIGA) 10 MG TABS tablet, Take 10 mg by mouth daily., Disp: , Rfl:    docusate sodium (COLACE) 100 MG capsule, Take 1 capsule (100 mg total) by mouth 2 (two) times daily., Disp: 10 capsule, Rfl: 0   Evolocumab (REPATHA SURECLICK) 174 MG/ML SOAJ, Inject 140 mg into the skin every 14 (fourteen) days., Disp: , Rfl:    fenofibrate (TRICOR) 145 MG tablet, Take 145 mg by mouth daily., Disp: , Rfl:    furosemide (LASIX) 20 MG tablet, Take 20 mg by mouth daily., Disp: ,  Rfl:    HUMALOG KWIKPEN 100 UNIT/ML KwikPen, Inject 10-50 Units into the skin at bedtime. Sliding scale, Disp: , Rfl:    isosorbide mononitrate (IMDUR) 30 MG 24 hr tablet, Take 30 mg by mouth daily., Disp: , Rfl:    metFORMIN (GLUCOPHAGE) 500 MG tablet, Take 500 mg by mouth 2 (two) times daily with a meal., Disp: , Rfl:    metoprolol succinate (TOPROL-XL) 25 MG 24 hr tablet, Take 1 tablet (25 mg total) by mouth in the morning, at noon, and at bedtime., Disp: 30 tablet, Rfl: 9   nitroGLYCERIN (NITROSTAT) 0.4 MG SL tablet, Place 0.4 mg under the tongue every 5 (five) minutes as needed for chest  pain., Disp: , Rfl:    nystatin (MYCOSTATIN/NYSTOP) powder, Apply 1 application topically daily as needed (rash/yeast)., Disp: , Rfl:    omeprazole (PRILOSEC) 20 MG capsule, Take 20 mg by mouth daily., Disp: , Rfl:    promethazine (PHENERGAN) 25 MG tablet, Take 1 tablet (25 mg total) by mouth every 6 (six) hours as needed for nausea or vomiting., Disp: 30 tablet, Rfl: 0   rosuvastatin (CRESTOR) 20 MG tablet, Take 20 mg by mouth daily., Disp: , Rfl:    TRESIBA FLEXTOUCH 100 UNIT/ML FlexTouch Pen, Inject 30 Units into the skin daily. (Patient taking differently: Inject 40 Units into the skin daily.), Disp: , Rfl:    valsartan (DIOVAN) 80 MG tablet, Take 1 tablet (80 mg total) by mouth 2 (two) times daily., Disp: 180 tablet, Rfl: 3   vitamin B-12 (CYANOCOBALAMIN) 1000 MCG tablet, Take 1,000 mcg by mouth daily., Disp: , Rfl:    vitamin C (ASCORBIC ACID) 500 MG tablet, Take 500 mg by mouth daily., Disp: , Rfl:    Vitamin D, Ergocalciferol, (DRISDOL) 1.25 MG (50000 UNIT) CAPS capsule, Take 50,000 Units by mouth every Saturday., Disp: , Rfl:   Social History   Tobacco Use  Smoking Status Former   Types: Cigarettes   Quit date: 05/2016   Years since quitting: 5.7  Smokeless Tobacco Never    Allergies  Allergen Reactions   Vancomycin Anaphylaxis   Chlorhexidine Gluconate Itching    Received CHG bath, began itching, required benadryl   Cat Hair Extract Other (See Comments)    Sneezing, watery eyes.   Tramadol Other (See Comments)    Hallucinations   Codeine Rash   Objective:  There were no vitals filed for this visit. There is no height or weight on file to calculate BMI. Constitutional Well developed. Well nourished.  Vascular Foot warm and well perfused. Capillary refill normal to all digits.   Neurologic Normal speech. Oriented to person, place, and time. Epicritic sensation to light touch grossly present bilaterally.  Dermatologic Skin healing well without signs of infection. Skin  edges well coapted without signs of infection.  Orthopedic: Tenderness to palpation noted about the surgical site.   Radiographs: None Assessment:   1. Foot ulcer, left, with fat layer exposed ([place])   2. Uncontrolled type 2 diabetes mellitus with hyperglycemia ([place])   3. Status post transmetatarsal amputation of foot, left ([place])    Plan:  Patient was evaluated and treated and all questions answered.  S/p foot surgery left -Progressing as expected post-operatively. -XR: See above -WB Status: Nonweightbearing in left lower extremity -Sutures: Intact.  Superficial dehiscence noted to the lateral side of the transmetatarsal amputation stump. -Medications: I encouraged Betadine wet-to-dry dressing daily.  I have placed a Surgicel on the incision site as well. -Foot redressed.  No  follow-ups on file.

## 2022-02-07 NOTE — Telephone Encounter (Signed)
°*  STAT* If patient is at the pharmacy, call can be transferred to refill team.   1. Which medications need to be refilled? (please list name of each medication and dose if known) metoprolol succinate (TOPROL-XL) 25 MG 24 hr tablet  2. Which pharmacy/location (including street and city if local pharmacy) is medication to be sent to?[place], [place]  3. Do they need a 30 day or 90 day supply? 30 day

## 2022-02-07 NOTE — Progress Notes (Signed)
Subjective: Jasmine Richardson is a 61 y.o. female patient seen today in office for POV #1 with me but did see Dr. Posey Pronto the first day after surgery due to bleeding through the bandage (DOS 02/01/2022), S/P irrigation and debridement with bone biopsy of left transmetatarsal stump with revision after medically being cleared by cardiologist. Patient admits a little pain at the surgical site states that the Dilaudid medication made her have nightmares so she stopped taking it and is only taking Tylenol, denies calf pain, denies headache, chest pain, shortness of breath, nausea, vomiting, fever, or chills. Patient is assisted by husband who states that he cannot be home from work without pay so he is still looking into disability options to help wife at home to avoid her from getting up and walking around on the foot.  Patient states that she has been compliant with this and using wheelchair at home and if she has to put pressure on the foot it is for a few steps to the heel.  No other pedal complaints noted.  Patient Active Problem List   Diagnosis Date Noted   Bacteremia 11/22/2021   Diabetic foot infection ([place]) 11/21/2021   Abnormal stress test 10/23/2021   Abnormal EKG 10/23/2021   AKI (acute kidney injury) ([place]) 10/23/2021   Anxiety 10/23/2021   Cellulitis of right breast 10/23/2021   Gangrene ([place]) 10/23/2021   Hematoma 10/23/2021   Hiatal hernia 10/23/2021   Hyperglycemia 10/23/2021   Hyperglycemia due to type 2 diabetes mellitus ([place]) 10/23/2021   Hyponatremia 10/23/2021   Infection of great toe 10/23/2021   Lactic acidosis 10/23/2021   Leukocytosis 10/23/2021   Obesity (BMI 30-39.9) 10/23/2021   Perineal abscess 10/23/2021   Cellulitis, wound, post-operative 10/09/2021   Abnormal mammogram of left breast 09/04/2021   Breast wound, right, subsequent encounter 09/04/2021   Demand ischemia [facility])    ICD (implantable cardioverter-defibrillator) in place 05/16/2021   Ventricular tachycardia  12/17/2020   Unstable angina ([place]) 05/22/2020   NSTEMI (non-ST elevated myocardial infarction) ([place]) 05/22/2020   Diabetic foot ulcer ([place]) 02/16/2020   Angina pectoris ([place]) 09/11/2017   Emphysema lung ([place]) 09/11/2017   Chronic diastolic heart failure ([place]) 11/06/2016   Mediastinitis 06/28/2016   Wound, surgical, infected 06/06/2016   S/P CABG (coronary artery bypass graft) 06/03/2016   Severe sepsis ([place]) 06/03/2016   GERD (gastroesophageal reflux disease) 05/08/2016   Morbid obesity ([place]) 05/08/2016   Type 2 diabetes mellitus with foot ulcer ([place]) 05/08/2016   Sinus tachycardia 05/08/2016   Acute chest pain 05/08/2016   Burping 05/08/2016   Tobacco use disorder 04/20/2016   Coronary artery disease involving native coronary artery of native heart with angina pectoris ([place]) 09/12/2015   Essential hypertension 09/12/2015   Hyperlipidemia 09/12/2015    Current Outpatient Medications on File Prior to Visit  Medication Sig Dispense Refill   amiodarone (PACERONE) 200 MG tablet Take by mouth.     fenofibrate (TRICOR) 145 MG tablet Take 1 tablet by mouth daily.     HYDROmorphone (DILAUDID) 2 MG tablet TAKE 1 TABLET BY MOUTH EVERY 6 HOURS AS NEEDED FOR SEVERE OR MODERATE PAIN FOR UP TO 5 DAYS     omeprazole (PRILOSEC) 20 MG capsule Take 1 capsule by mouth daily.     senna-docusate (SENOKOT-S) 8.6-50 MG tablet Take 1 tablet by mouth daily.     acetaminophen (TYLENOL) 325 MG tablet Take 2 tablets (650 mg total) by mouth every 4 (four) hours as needed for headache or mild pain.  amiodarone (PACERONE) 200 MG tablet Take 200 mg by mouth daily.     amoxicillin-clavulanate (AUGMENTIN) 875-125 MG tablet Take 1 tablet by mouth 2 (two) times daily for 14 days. 28 tablet 0   ascorbic acid (VITAMIN C) 250 MG tablet Take by mouth.     aspirin 81 MG chewable tablet Chew 1 tablet by mouth daily.     aspirin EC 81 MG tablet Take 81 mg by mouth daily. Swallow whole.     clopidogrel (PLAVIX) 75 MG  tablet Take 75 mg by mouth daily.     cyanocobalamin 1000 MCG tablet Take 1 tablet by mouth daily.     dapagliflozin propanediol (FARXIGA) 10 MG TABS tablet Take 10 mg by mouth daily.     docusate sodium (COLACE) 100 MG capsule Take 1 capsule (100 mg total) by mouth 2 (two) times daily. 10 capsule 0   Evolocumab (REPATHA SURECLICK) 834 MG/ML SOAJ Inject 140 mg into the skin every 14 (fourteen) days.     fenofibrate (TRICOR) 145 MG tablet Take 145 mg by mouth daily.     furosemide (LASIX) 20 MG tablet Take 20 mg by mouth daily.     glipiZIDE (GLUCOTROL) 10 MG tablet Take by mouth.     HUMALOG KWIKPEN 100 UNIT/ML KwikPen Inject 10-50 Units into the skin at bedtime. Sliding scale     isosorbide mononitrate (IMDUR) 30 MG 24 hr tablet Take 30 mg by mouth daily.     LORazepam (ATIVAN) 1 MG tablet Take by mouth.     metFORMIN (GLUCOPHAGE) 500 MG tablet Take 500 mg by mouth 2 (two) times daily with a meal.     nitroGLYCERIN (NITROSTAT) 0.4 MG SL tablet Place 0.4 mg under the tongue every 5 (five) minutes as needed for chest pain.     nystatin (MYCOSTATIN/NYSTOP) powder Apply 1 application topically daily as needed (rash/yeast).     omeprazole (PRILOSEC) 20 MG capsule Take 20 mg by mouth daily.     promethazine (PHENERGAN) 25 MG tablet Take 1 tablet (25 mg total) by mouth every 6 (six) hours as needed for nausea or vomiting. 30 tablet 0   rosuvastatin (CRESTOR) 20 MG tablet Take 20 mg by mouth daily.     TRESIBA FLEXTOUCH 100 UNIT/ML FlexTouch Pen Inject 30 Units into the skin daily. (Patient taking differently: Inject 40 Units into the skin daily.)     valsartan (DIOVAN) 80 MG tablet Take 1 tablet (80 mg total) by mouth 2 (two) times daily. 180 tablet 3   vitamin B-12 (CYANOCOBALAMIN) 1000 MCG tablet Take 1,000 mcg by mouth daily.     vitamin C (ASCORBIC ACID) 500 MG tablet Take 500 mg by mouth daily.     Vitamin D, Ergocalciferol, (DRISDOL) 1.25 MG (50000 UNIT) CAPS capsule Take 50,000 Units by mouth  every Saturday.     No current facility-administered medications on file prior to visit.    Allergies  Allergen Reactions   Vancomycin Anaphylaxis   Chlorhexidine Gluconate Itching    Received CHG bath, began itching, required benadryl   Cat Hair Extract Other (See Comments)    Sneezing, watery eyes.   Tramadol Other (See Comments)    Hallucinations   Codeine Rash    Objective: There were no vitals filed for this visit.  General: No acute distress, AAOx3  Left foot/TMA amputation stump: Sutures and staples intact with no gapping or dehiscence at surgical site, there is no active bleeding however I did advise patient if she is holding her  foot down too long or putting pressure on her stump when transferring that it can cause increased oozing, mild swelling to amputation stump site, minimal erythema, no warmth, no current active drainage, no other clinical signs of infection noted, DP and PT pedal pulses palpable, varicosities to the lower leg on the left, no pain with palpation to calf and no limitation or pain with range of motion to ankle, status post left foot transmetatarsal amputation with revision.     Post Op Xray, left foot there is clear resection margins of bone at each corresponding metatarsal consistent with transmetatarsal amputation, no obvious osseous erosions or acute bony changes to suggest recurrent osteomyelitis, mild soft tissue swelling.  No other acute findings.  Pathology reviewed suggestive of a combination of acute and chronic osteomyelitis in the resected metatarsals  Microbiology culture initially negative but then resulted with corynebacterium  Assessment and Plan:  Problem List Items Addressed This Visit   None Visit Diagnoses     S/P transmetatarsal amputation of foot, left ([place])    -  Primary   Relevant Orders   DG Foot Complete Left   Other chronic osteomyelitis of left foot ([place])       Uncontrolled type 2 diabetes mellitus with hyperglycemia  (H[facility])       Relevant Medications   aspirin 81 MG chewable tablet   glipiZIDE (GLUCOTROL) 10 MG tablet   Left foot pain       History of UTI            -Patient seen and evaluated -X-rays reviewed -Discussed with patient surgical pathology and microbiology as above -I advised patient that I did the proximal most resection and that at this point clinically the amputation stump looks good and there is no acute concerns however her poorly controlled diabetes, PVD, and other comorbidities may impede or slow down her healing I discussed in great detail that it is important for patient to get with a PCP or endocrinologist for management in order to prevent poor healing and further limb loss including a below the knee amputation or recurrent bacteremia that could result in death -I advised patient that after she has completed the Augmentin of which I prescribed for her UTI that also has coverage for corynebacterium, we will continue to monitor and if her left foot fails to heal within a standard timeframe of 6 to 8 weeks then she should reconsider having a repeat MRI and possibly having a more proximal amputation however at this time there may be a small chance that we could help save the foot as long as she is compliant with staying off of her foot as I have previously directed to help the stump heal and is compliant with making sure she is getting her blood sugars under control -Diflucan unable to be prescribed due to its interaction with amiodarone -Applied Surgicel to help with any additional oozing that may happen of blood at the surgical incision line however no active bleeding noted at today's encounter covered with Betadine soaked gauze and dry sterile dressing to surgical site left foot secured with ACE wrap and stockinet.  Advised patient if there is bloody strikethrough to reinforce with dry dressing and to call office for further recommendations. -Advised patient to continue with  nonweightbearing with use of wheelchair only slight pressure to the heel for transfers only -Advised patient to continue with Tylenol for pain -Continue with rest and elevation for pain and bleeding control -Gave husband advice on FMLA and disability  and advised patient's husband that he should attempt to try to apply for these benefits to see if he can be out of work at least 3 weeks to help patient initially or find an accommodation of getting son or family member to help with food told her to drink and bathing without getting the foot wet.  Husband reports that he will try to work on getting this paperwork submitted to his short-term disability company and will also try to have his son help some during the day. -Will plan for postoperative care/dressing change at next office visit. In the meantime, patient to call office if any issues or problems arise.   Landis Martins, DPM

## 2022-02-09 ENCOUNTER — Other Ambulatory Visit: Payer: Self-pay | Admitting: Sports Medicine

## 2022-02-09 DIAGNOSIS — Z89439 Acquired absence of unspecified foot: Secondary | ICD-10-CM

## 2022-02-13 ENCOUNTER — Encounter (HOSPITAL_COMMUNITY): Payer: Managed Care, Other (non HMO)

## 2022-02-13 ENCOUNTER — Ambulatory Visit: Payer: Managed Care, Other (non HMO)

## 2022-02-14 ENCOUNTER — Other Ambulatory Visit: Payer: Self-pay

## 2022-02-14 ENCOUNTER — Ambulatory Visit (INDEPENDENT_AMBULATORY_CARE_PROVIDER_SITE_OTHER): Payer: Managed Care, Other (non HMO) | Admitting: Sports Medicine

## 2022-02-14 ENCOUNTER — Encounter: Payer: Self-pay | Admitting: Sports Medicine

## 2022-02-14 VITALS — Temp 97.2°F

## 2022-02-14 DIAGNOSIS — M79672 Pain in left foot: Secondary | ICD-10-CM

## 2022-02-14 DIAGNOSIS — Z89432 Acquired absence of left foot: Secondary | ICD-10-CM

## 2022-02-14 DIAGNOSIS — M86672 Other chronic osteomyelitis, left ankle and foot: Secondary | ICD-10-CM

## 2022-02-14 DIAGNOSIS — E1165 Type 2 diabetes mellitus with hyperglycemia: Secondary | ICD-10-CM

## 2022-02-14 NOTE — Progress Notes (Signed)
Subjective: Jasmine Richardson is a 61 y.o. female patient seen today in office for POV #2 date of surgery 02/01/2022 left foot irrigation and debridement with bone biopsy and primary closure at TMA stump site essentially a revision.  Patient reports that she had a fall walking outside on the gravel with her surgical shoe on and states that she did have some bleeding over the weekend but now the bleeding is better husband has been helping to change bandage every day and has been applying Medihoney to the stump.  Patient denies any other pedal complaints or constitutional symptoms at this time.  Patient is in the process of getting a new primary care doctor.  Reports previous fasting blood sugar of 114  Patient Active Problem List   Diagnosis Date Noted   Bacteremia 11/22/2021   Diabetic foot infection ([place]) 11/21/2021   Abnormal stress test 10/23/2021   Abnormal EKG 10/23/2021   AKI (acute kidney injury) ([place]) 10/23/2021   Anxiety 10/23/2021   Cellulitis of right breast 10/23/2021   Gangrene ([place]) 10/23/2021   Hematoma 10/23/2021   Hiatal hernia 10/23/2021   Hyperglycemia 10/23/2021   Hyperglycemia due to type 2 diabetes mellitus ([place]) 10/23/2021   Hyponatremia 10/23/2021   Infection of great toe 10/23/2021   Lactic acidosis 10/23/2021   Leukocytosis 10/23/2021   Obesity (BMI 30-39.9) 10/23/2021   Perineal abscess 10/23/2021   Cellulitis, wound, post-operative 10/09/2021   Abnormal mammogram of left breast 09/04/2021   Breast wound, right, subsequent encounter 09/04/2021   Demand ischemia ([place])    ICD (implantable cardioverter-defibrillator) in place 05/16/2021   Ventricular tachycardia 12/17/2020   Unstable angina ([place]) 05/22/2020   NSTEMI (non-ST elevated myocardial infarction) ([place]) 05/22/2020   Diabetic foot ulcer ([place]) 02/16/2020   Angina pectoris ([place]) 09/11/2017   Emphysema lung ([place]) 09/11/2017   Chronic diastolic heart failure ([place]) 11/06/2016   Mediastinitis 06/28/2016    Wound, surgical, infected 06/06/2016   S/P CABG (coronary artery bypass graft) 06/03/2016   Severe sepsis ([place]) 06/03/2016   GERD (gastroesophageal reflux disease) 05/08/2016   Morbid obesity ([place]) 05/08/2016   Type 2 diabetes mellitus with foot ulcer ([place]) 05/08/2016   Sinus tachycardia 05/08/2016   Acute chest pain 05/08/2016   Burping 05/08/2016   Tobacco use disorder 04/20/2016   Coronary artery disease involving native coronary artery of native heart with angina pectoris ([place]) 09/12/2015   Essential hypertension 09/12/2015   Hyperlipidemia 09/12/2015    Current Outpatient Medications on File Prior to Visit  Medication Sig Dispense Refill   acetaminophen (TYLENOL) 325 MG tablet Take 2 tablets (650 mg total) by mouth every 4 (four) hours as needed for headache or mild pain.     amiodarone (PACERONE) 200 MG tablet Take 200 mg by mouth daily.     amiodarone (PACERONE) 200 MG tablet Take by mouth.     amoxicillin-clavulanate (AUGMENTIN) 875-125 MG tablet Take 1 tablet by mouth 2 (two) times daily for 14 days. 28 tablet 0   ascorbic acid (VITAMIN C) 250 MG tablet Take by mouth.     aspirin 81 MG chewable tablet Chew 1 tablet by mouth daily.     aspirin EC 81 MG tablet Take 81 mg by mouth daily. Swallow whole.     clopidogrel (PLAVIX) 75 MG tablet Take 75 mg by mouth daily.     cyanocobalamin 1000 MCG tablet Take 1 tablet by mouth daily.     dapagliflozin propanediol (FARXIGA) 10 MG TABS tablet Take 10 mg by mouth daily.  docusate sodium (COLACE) 100 MG capsule Take 1 capsule (100 mg total) by mouth 2 (two) times daily. 10 capsule 0   Evolocumab (REPATHA SURECLICK) 400 MG/ML SOAJ Inject 140 mg into the skin every 14 (fourteen) days.     fenofibrate (TRICOR) 145 MG tablet Take 145 mg by mouth daily.     fenofibrate (TRICOR) 145 MG tablet Take 1 tablet by mouth daily.     furosemide (LASIX) 20 MG tablet Take 20 mg by mouth daily.     glipiZIDE (GLUCOTROL) 10 MG tablet Take by mouth.      HUMALOG KWIKPEN 100 UNIT/ML KwikPen Inject 10-50 Units into the skin at bedtime. Sliding scale     HYDROmorphone (DILAUDID) 2 MG tablet TAKE 1 TABLET BY MOUTH EVERY 6 HOURS AS NEEDED FOR SEVERE OR MODERATE PAIN FOR UP TO 5 DAYS     isosorbide mononitrate (IMDUR) 30 MG 24 hr tablet Take 30 mg by mouth daily.     LORazepam (ATIVAN) 1 MG tablet Take by mouth.     metFORMIN (GLUCOPHAGE) 500 MG tablet Take 500 mg by mouth 2 (two) times daily with a meal.     metoprolol succinate (TOPROL-XL) 25 MG 24 hr tablet Take 1 tablet (25 mg total) by mouth in the morning, at noon, and at bedtime. 30 tablet 9   nitroGLYCERIN (NITROSTAT) 0.4 MG SL tablet Place 0.4 mg under the tongue every 5 (five) minutes as needed for chest pain.     nystatin (MYCOSTATIN/NYSTOP) powder Apply 1 application topically daily as needed (rash/yeast).     omeprazole (PRILOSEC) 20 MG capsule Take 20 mg by mouth daily.     omeprazole (PRILOSEC) 20 MG capsule Take 1 capsule by mouth daily.     promethazine (PHENERGAN) 25 MG tablet Take 1 tablet (25 mg total) by mouth every 6 (six) hours as needed for nausea or vomiting. 30 tablet 0   rosuvastatin (CRESTOR) 20 MG tablet Take 20 mg by mouth daily.     senna-docusate (SENOKOT-S) 8.6-50 MG tablet Take 1 tablet by mouth daily.     TRESIBA FLEXTOUCH 100 UNIT/ML FlexTouch Pen Inject 30 Units into the skin daily. (Patient taking differently: Inject 40 Units into the skin daily.)     valsartan (DIOVAN) 80 MG tablet Take 1 tablet (80 mg total) by mouth 2 (two) times daily. 180 tablet 3   vitamin B-12 (CYANOCOBALAMIN) 1000 MCG tablet Take 1,000 mcg by mouth daily.     vitamin C (ASCORBIC ACID) 500 MG tablet Take 500 mg by mouth daily.     Vitamin D, Ergocalciferol, (DRISDOL) 1.25 MG (50000 UNIT) CAPS capsule Take 50,000 Units by mouth every Saturday.     No current facility-administered medications on file prior to visit.    Allergies  Allergen Reactions   Vancomycin Anaphylaxis    Chlorhexidine Gluconate Itching    Received CHG bath, began itching, required benadryl   Cat Hair Extract Other (See Comments)    Sneezing, watery eyes.   Tramadol Other (See Comments)    Hallucinations   Codeine Rash    Objective: There were no vitals filed for this visit.  General: No acute distress, AAOx3  Left foot/TMA amputation stump: Sutures and staples intact with no gapping or dehiscence at surgical site, there is minimal oozing noted from the central incision line no underlying opening, no warmth no malodor no significant erythema no other clinical signs of infection noted, DP and PT pedal pulses palpable, varicosities to the lower leg on the left,  no pain with palpation to calf and no limitation or pain with range of motion to ankle, status post left foot transmetatarsal amputation with revision.  Assessment and Plan:  Problem List Items Addressed This Visit   None Visit Diagnoses     S/P transmetatarsal amputation of foot, left ([place])    -  Primary   Other chronic osteomyelitis of left foot ([place])       Uncontrolled type 2 diabetes mellitus with hyperglycemia ([place])       Left foot pain            -Patient seen and evaluated -Dressing change performed -Applied silver nitrate and Surgicel to help with any additional oozing that may happen of blood at the surgical incision line however minimal oozing bleeding noted at today's encounter covered with dry sterile dressing to surgical site left foot secured with ACE wrap and stockinet.  Advised patient's husband that he can continue with dressing changes every day however do not use Medihoney except once a week if needed -Advised patient to continue with nonweightbearing with use of wheelchair only slight pressure to the heel for transfers only however patient is noted to be walking and she was again strongly advised to refrain from walking because she can bump her stump or make it swell or bleed more -Advised patient to continue  with Tylenol for pain -Continue with rest and elevation for pain and bleeding control as previously recommended -Patient has now completed oral Augmentin which was originally given for UTI that also covered corynebacterium -Patient to get in as soon as possible and has an appointment scheduled with new primary doctor on 03/03/2022 -Will plan for postoperative care/dressing change at next office visit.  Patient will be seen weekly for close monitoring and advised patient if this fails to heal she is at risk of a below the knee amputation.  In the meantime, patient to call office if any issues or problems arise.   Landis Martins, DPM

## 2022-02-17 ENCOUNTER — Other Ambulatory Visit: Payer: Self-pay | Admitting: Cardiology

## 2022-02-19 ENCOUNTER — Ambulatory Visit (HOSPITAL_COMMUNITY): Payer: Managed Care, Other (non HMO)

## 2022-02-20 ENCOUNTER — Encounter: Payer: Managed Care, Other (non HMO) | Admitting: Sports Medicine

## 2022-02-21 ENCOUNTER — Other Ambulatory Visit: Payer: Self-pay

## 2022-02-21 ENCOUNTER — Ambulatory Visit (INDEPENDENT_AMBULATORY_CARE_PROVIDER_SITE_OTHER): Payer: Managed Care, Other (non HMO) | Admitting: Sports Medicine

## 2022-02-21 ENCOUNTER — Encounter: Payer: Self-pay | Admitting: Sports Medicine

## 2022-02-21 DIAGNOSIS — M86672 Other chronic osteomyelitis, left ankle and foot: Secondary | ICD-10-CM

## 2022-02-21 DIAGNOSIS — E1165 Type 2 diabetes mellitus with hyperglycemia: Secondary | ICD-10-CM

## 2022-02-21 DIAGNOSIS — M79672 Pain in left foot: Secondary | ICD-10-CM

## 2022-02-21 DIAGNOSIS — Z89432 Acquired absence of left foot: Secondary | ICD-10-CM

## 2022-02-21 NOTE — Progress Notes (Signed)
Subjective: ?Jasmine Richardson is a 61 y.o. female patient seen today in office for POV #3, date of surgery 02/01/2022 left foot irrigation and debridement with bone biopsy and primary closure at TMA stump site essentially a revision.  Patient reports that she is doing okay very minimal bleeding at the surgery site.  Patient reports that she was called for a follow-up visit by somebody but she was not sure who it was so she told them that she did not need to go.  Patient denies nausea vomiting fever chills or any constitutional symptoms at this time ? ?Patient is in the process of getting a new primary care doctor has an appointment on 3/30. ? ?Reports previous fasting blood sugar of 200 but states that she has been doing better and not getting sugars around 3 or 400 ? ?Patient Active Problem List  ? Diagnosis Date Noted  ? Bacteremia 11/22/2021  ? Diabetic foot infection ([place]) 11/21/2021  ? Abnormal stress test 10/23/2021  ? Abnormal EKG 10/23/2021  ? AKI (acute kidney injury) ([place]) 10/23/2021  ? Anxiety 10/23/2021  ? Cellulitis of right breast 10/23/2021  ? Gangrene ([place]) 10/23/2021  ? Hematoma 10/23/2021  ? Hiatal hernia 10/23/2021  ? Hyperglycemia 10/23/2021  ? Hyperglycemia due to type 2 diabetes mellitus ([place]) 10/23/2021  ? Hyponatremia 10/23/2021  ? Infection of great toe 10/23/2021  ? Lactic acidosis 10/23/2021  ? Leukocytosis 10/23/2021  ? Obesity (BMI 30-39.9) 10/23/2021  ? Perineal abscess 10/23/2021  ? Cellulitis, wound, post-operative 10/09/2021  ? Abnormal mammogram of left breast 09/04/2021  ? Breast wound, right, subsequent encounter 09/04/2021  ? Demand ischemia ([place])   ? ICD (implantable cardioverter-defibrillator) in place 05/16/2021  ? Ventricular tachycardia 12/17/2020  ? Unstable angina ([place]) 05/22/2020  ? NSTEMI (non-ST elevated myocardial infarction) ([place]) 05/22/2020  ? Diabetic foot ulcer ([place]) 02/16/2020  ? Angina pectoris ([place]) 09/11/2017  ? Emphysema lung ([place]) 09/11/2017  ? Chronic diastolic  heart failure ([place]) 11/06/2016  ? Mediastinitis 06/28/2016  ? Wound, surgical, infected 06/06/2016  ? S/P CABG (coronary artery bypass graft) 06/03/2016  ? Severe sepsis ([place]) 06/03/2016  ? GERD (gastroesophageal reflux disease) 05/08/2016  ? Morbid obesity ([place]) 05/08/2016  ? Type 2 diabetes mellitus with foot ulcer ([place]) 05/08/2016  ? Sinus tachycardia 05/08/2016  ? Acute chest pain 05/08/2016  ? Burping 05/08/2016  ? Tobacco use disorder 04/20/2016  ? Coronary artery disease involving native coronary artery of native heart with angina pectoris ([place]) 09/12/2015  ? Essential hypertension 09/12/2015  ? Hyperlipidemia 09/12/2015  ? ? ?Current Outpatient Medications on File Prior to Visit  ?Medication Sig Dispense Refill  ? acetaminophen (TYLENOL) 325 MG tablet Take 2 tablets (650 mg total) by mouth every 4 (four) hours as needed for headache or mild pain.    ? amiodarone (PACERONE) 200 MG tablet Take by mouth.    ? ascorbic acid (VITAMIN C) 250 MG tablet Take by mouth.    ? aspirin 81 MG chewable tablet Chew 1 tablet by mouth daily.    ? clopidogrel (PLAVIX) 75 MG tablet Take 75 mg by mouth daily.    ? cyanocobalamin 1000 MCG tablet Take 1 tablet by mouth daily.    ? dapagliflozin propanediol (FARXIGA) 10 MG TABS tablet Take 10 mg by mouth daily.    ? docusate sodium (COLACE) 100 MG capsule Take 1 capsule (100 mg total) by mouth 2 (two) times daily. 10 capsule 0  ? Evolocumab (REPATHA SURECLICK) 675 MG/ML SOAJ Inject 140 mg into the skin  every 14 (fourteen) days.    ? fenofibrate (TRICOR) 145 MG tablet Take 1 tablet by mouth once daily 30 tablet 0  ? furosemide (LASIX) 20 MG tablet Take 20 mg by mouth daily.    ? glipiZIDE (GLUCOTROL) 10 MG tablet Take by mouth.    ? HUMALOG KWIKPEN 100 UNIT/ML KwikPen Inject 10-50 Units into the skin at bedtime. Sliding scale    ? HYDROmorphone (DILAUDID) 2 MG tablet TAKE 1 TABLET BY MOUTH EVERY 6 HOURS AS NEEDED FOR SEVERE OR MODERATE PAIN FOR UP TO 5 DAYS    ? isosorbide  mononitrate (IMDUR) 30 MG 24 hr tablet Take 30 mg by mouth daily.    ? LORazepam (ATIVAN) 1 MG tablet Take by mouth.    ? metFORMIN (GLUCOPHAGE) 500 MG tablet Take 500 mg by mouth 2 (two) times daily with a meal.    ? metoprolol succinate (TOPROL-XL) 25 MG 24 hr tablet Take 1 tablet (25 mg total) by mouth in the morning, at noon, and at bedtime. 30 tablet 9  ? nitroGLYCERIN (NITROSTAT) 0.4 MG SL tablet Place 0.4 mg under the tongue every 5 (five) minutes as needed for chest pain.    ? nystatin (MYCOSTATIN/NYSTOP) powder Apply 1 application topically daily as needed (rash/yeast).    ? omeprazole (PRILOSEC) 20 MG capsule Take 1 capsule by mouth daily.    ? promethazine (PHENERGAN) 25 MG tablet Take 1 tablet (25 mg total) by mouth every 6 (six) hours as needed for nausea or vomiting. 30 tablet 0  ? rosuvastatin (CRESTOR) 20 MG tablet Take 20 mg by mouth daily.    ? senna-docusate (SENOKOT-S) 8.6-50 MG tablet Take 1 tablet by mouth daily.    ? TRESIBA FLEXTOUCH 100 UNIT/ML FlexTouch Pen Inject 30 Units into the skin daily. (Patient taking differently: Inject 40 Units into the skin daily.)    ? valsartan (DIOVAN) 80 MG tablet Take 1 tablet (80 mg total) by mouth 2 (two) times daily. 180 tablet 3  ? vitamin B-12 (CYANOCOBALAMIN) 1000 MCG tablet Take 1,000 mcg by mouth daily.    ? vitamin C (ASCORBIC ACID) 500 MG tablet Take 500 mg by mouth daily.    ? Vitamin D, Ergocalciferol, (DRISDOL) 1.25 MG (50000 UNIT) CAPS capsule Take 50,000 Units by mouth every Saturday.    ? ?No current facility-administered medications on file prior to visit.  ? ? ?Allergies  ?Allergen Reactions  ? Vancomycin Anaphylaxis  ? Chlorhexidine Gluconate Itching  ?  Received CHG bath, began itching, required benadryl  ? Cat Hair Extract Other (See Comments)  ?  Sneezing, watery eyes.  ? Tramadol Other (See Comments)  ?  Hallucinations  ? Codeine Rash  ? ? ?Objective: ?There were no vitals filed for this visit. ? ?General: No acute distress, AAOx3   ?Left foot/TMA amputation stump: Sutures and staples intact with no gapping or dehiscence at surgical site, there is decreased bleeding noted from the central incision line no underlying opening, no warmth no malodor no significant erythema no other clinical signs of infection noted, DP and PT pedal pulses palpable, varicosities to the lower leg on the left, no pain with palpation to calf and no limitation or pain with range of motion to ankle, status post left foot transmetatarsal amputation with revision. ? ?Assessment and Plan:  ?Problem List Items Addressed This Visit   ?None ?Visit Diagnoses   ? ? S/P transmetatarsal amputation of foot, left ([place])    -  Primary  ? Other chronic osteomyelitis  of left foot ([place])      ? Uncontrolled type 2 diabetes mellitus with hyperglycemia ([place])      ? Left foot pain      ? ?  ?  ? ?-Patient seen and evaluated ?-Dressing change performed ?-Applied Surgicel to help with any additional bleeding that may happen at the surgical incision however it appears to be improving covered with dry sterile dressing to surgical site left foot secured with ACE wrap and stockinet.  Advised patient's husband that he can continue with dressing changes every day using only dry gauze please discontinue Medihoney ?-Advised patient to continue with nonweightbearing with use of wheelchair only slight pressure to the heel for transfers only like previous ?-Advised patient to continue with Tylenol for pain like before ?-Continue with rest and elevation for pain and bleeding control like before ?-No additional antibiotics needed at this time patient has completed antibiotics with infectious disease as well as 2 weeks of oral Augmentin for UTI and corynebacterium at the foot ?-Will plan for x-rays and postoperative care/dressing change at next office visit.  Patient will be seen weekly for close monitoring and that she is still at risk for below the knee amputation however at this time we are still  remaining hopeful since things are improving to try to heal this transmetatarsal amputation stump site on the left foot. ? ?Landis Martins, DPM ? ?

## 2022-02-28 ENCOUNTER — Other Ambulatory Visit: Payer: Self-pay

## 2022-02-28 ENCOUNTER — Ambulatory Visit (INDEPENDENT_AMBULATORY_CARE_PROVIDER_SITE_OTHER): Payer: Managed Care, Other (non HMO) | Admitting: Sports Medicine

## 2022-02-28 ENCOUNTER — Encounter: Payer: Self-pay | Admitting: Sports Medicine

## 2022-02-28 ENCOUNTER — Ambulatory Visit (INDEPENDENT_AMBULATORY_CARE_PROVIDER_SITE_OTHER): Payer: Managed Care, Other (non HMO)

## 2022-02-28 DIAGNOSIS — E1165 Type 2 diabetes mellitus with hyperglycemia: Secondary | ICD-10-CM

## 2022-02-28 DIAGNOSIS — Z89432 Acquired absence of left foot: Secondary | ICD-10-CM

## 2022-02-28 DIAGNOSIS — M79672 Pain in left foot: Secondary | ICD-10-CM

## 2022-02-28 DIAGNOSIS — E661 Drug-induced obesity: Secondary | ICD-10-CM | POA: Insufficient documentation

## 2022-02-28 DIAGNOSIS — C801 Malignant (primary) neoplasm, unspecified: Secondary | ICD-10-CM | POA: Insufficient documentation

## 2022-02-28 DIAGNOSIS — M86672 Other chronic osteomyelitis, left ankle and foot: Secondary | ICD-10-CM

## 2022-02-28 NOTE — Progress Notes (Signed)
Subjective: ?Jasmine Richardson is a 61 y.o. female patient seen today in office for POV #4 date of surgery 02/01/2022 left foot irrigation and debridement with bone biopsy and primary closure at TMA stump site essentially a revision.  Patient reports that she is doing okay has been still applying dry dressing and has noticed less drainage or bleeding.  Patient denies nausea vomiting fever chills or any constitutional symptoms at this time ? ?Patient is in the process of getting a new primary care doctor has an appointment on 3/30 as previously noted ? ?Reports previous fasting blood sugar of 170 and states that she has been doing better with how she has been eating and has switched from white to wheat bread. ? ?Patient Active Problem List  ? Diagnosis Date Noted  ? Cancer ([place]) 02/28/2022  ? Bacteremia 11/22/2021  ? Diabetic foot infection ([place]) 11/21/2021  ? Abnormal stress test 10/23/2021  ? Abnormal EKG 10/23/2021  ? AKI (acute kidney injury) ([place]) 10/23/2021  ? Anxiety 10/23/2021  ? Cellulitis of right breast 10/23/2021  ? Gangrene ([place]) 10/23/2021  ? Hematoma 10/23/2021  ? Hiatal hernia 10/23/2021  ? Hyperglycemia 10/23/2021  ? Hyperglycemia due to type 2 diabetes mellitus ([place]) 10/23/2021  ? Hyponatremia 10/23/2021  ? Infection of great toe 10/23/2021  ? Lactic acidosis 10/23/2021  ? Leukocytosis 10/23/2021  ? Obesity (BMI 30-39.9) 10/23/2021  ? Perineal abscess 10/23/2021  ? Cellulitis, wound, post-operative 10/09/2021  ? Abnormal mammogram of left breast 09/04/2021  ? Breast wound, right, subsequent encounter 09/04/2021  ? Demand ischemia ([place])   ? ICD (implantable cardioverter-defibrillator) in place 05/16/2021  ? Ventricular tachycardia 12/17/2020  ? Unstable angina ([place]) 05/22/2020  ? NSTEMI (non-ST elevated myocardial infarction) ([place]) 05/22/2020  ? Diabetic foot ulcer ([place]) 02/16/2020  ? Angina pectoris ([place]) 09/11/2017  ? Emphysema lung ([place]) 09/11/2017  ? Chronic diastolic heart failure ([place]) 11/06/2016  ?  Mediastinitis 06/28/2016  ? Wound, surgical, infected 06/06/2016  ? S/P CABG (coronary artery bypass graft) 06/03/2016  ? Severe sepsis ([place]) 06/03/2016  ? GERD (gastroesophageal reflux disease) 05/08/2016  ? Morbid obesity ([place]) 05/08/2016  ? Type 2 diabetes mellitus with foot ulcer ([place]) 05/08/2016  ? Sinus tachycardia 05/08/2016  ? Acute chest pain 05/08/2016  ? Burping 05/08/2016  ? Peripheral vascular disease ([place]) 05/08/2016  ? Tobacco use disorder 04/20/2016  ? Coronary artery disease involving native coronary artery of native heart with angina pectoris ([place]) 09/12/2015  ? Essential hypertension 09/12/2015  ? Hyperlipidemia 09/12/2015  ? ? ?Current Outpatient Medications on File Prior to Visit  ?Medication Sig Dispense Refill  ? acetaminophen (TYLENOL) 325 MG tablet Take 2 tablets (650 mg total) by mouth every 4 (four) hours as needed for headache or mild pain.    ? amiodarone (PACERONE) 200 MG tablet Take by mouth.    ? ascorbic acid (VITAMIN C) 250 MG tablet Take by mouth.    ? aspirin 81 MG chewable tablet Chew 1 tablet by mouth daily.    ? clopidogrel (PLAVIX) 75 MG tablet Take 75 mg by mouth daily.    ? cyanocobalamin 1000 MCG tablet Take 1 tablet by mouth daily.    ? dapagliflozin propanediol (FARXIGA) 10 MG TABS tablet Take 10 mg by mouth daily.    ? docusate sodium (COLACE) 100 MG capsule Take 1 capsule (100 mg total) by mouth 2 (two) times daily. 10 capsule 0  ? Evolocumab (REPATHA SURECLICK) 782 MG/ML SOAJ Inject 140 mg into the skin every 14 (fourteen) days.    ?  fenofibrate (TRICOR) 145 MG tablet Take 1 tablet by mouth once daily 30 tablet 0  ? furosemide (LASIX) 20 MG tablet Take 20 mg by mouth daily.    ? glipiZIDE (GLUCOTROL) 10 MG tablet Take by mouth.    ? HUMALOG KWIKPEN 100 UNIT/ML KwikPen Inject 10-50 Units into the skin at bedtime. Sliding scale    ? HYDROmorphone (DILAUDID) 2 MG tablet TAKE 1 TABLET BY MOUTH EVERY 6 HOURS AS NEEDED FOR SEVERE OR MODERATE PAIN FOR UP TO 5 DAYS    ?  isosorbide mononitrate (IMDUR) 30 MG 24 hr tablet Take 30 mg by mouth daily.    ? LORazepam (ATIVAN) 1 MG tablet Take by mouth.    ? metFORMIN (GLUCOPHAGE) 500 MG tablet Take 500 mg by mouth 2 (two) times daily with a meal.    ? metoprolol succinate (TOPROL-XL) 25 MG 24 hr tablet Take 1 tablet (25 mg total) by mouth in the morning, at noon, and at bedtime. 30 tablet 9  ? nitroGLYCERIN (NITROSTAT) 0.4 MG SL tablet Place 0.4 mg under the tongue every 5 (five) minutes as needed for chest pain.    ? nystatin (MYCOSTATIN/NYSTOP) powder Apply 1 application topically daily as needed (rash/yeast).    ? omeprazole (PRILOSEC) 20 MG capsule Take 1 capsule by mouth daily.    ? promethazine (PHENERGAN) 25 MG tablet Take 1 tablet (25 mg total) by mouth every 6 (six) hours as needed for nausea or vomiting. 30 tablet 0  ? rosuvastatin (CRESTOR) 20 MG tablet Take 20 mg by mouth daily.    ? senna-docusate (SENOKOT-S) 8.6-50 MG tablet Take 1 tablet by mouth daily.    ? valsartan (DIOVAN) 80 MG tablet Take 1 tablet (80 mg total) by mouth 2 (two) times daily. 180 tablet 3  ? vitamin B-12 (CYANOCOBALAMIN) 1000 MCG tablet Take 1,000 mcg by mouth daily.    ? vitamin C (ASCORBIC ACID) 500 MG tablet Take 500 mg by mouth daily.    ? Vitamin D, Ergocalciferol, (DRISDOL) 1.25 MG (50000 UNIT) CAPS capsule Take 50,000 Units by mouth every Saturday.    ? ?No current facility-administered medications on file prior to visit.  ? ? ?Allergies  ?Allergen Reactions  ? Vancomycin Anaphylaxis  ? Chlorhexidine Gluconate Itching  ?  Received CHG bath, began itching, required benadryl  ? Cat Hair Extract Other (See Comments)  ?  Sneezing, watery eyes.  ? Tramadol Other (See Comments)  ?  Hallucinations  ? Codeine Rash  ? ? ?Objective: ?There were no vitals filed for this visit. ? ?General: No acute distress, AAOx3  ?Left foot/TMA amputation stump: Sutures and staples intact with no gapping or dehiscence at surgical site, there is decreased bleeding noted  from the central incision line no underlying opening and very minimal spotty bleeding noted at the lateral incision line, no warmth no malodor no significant erythema no other clinical signs of infection noted, DP and PT pedal pulses palpable, varicosities to the lower leg on the left, no pain with palpation to calf and no limitation or pain with range of motion to ankle, status post left foot transmetatarsal amputation with revision. ? ?X-rays consistent with amputation status no recurrent osteomyelitis noted at this time or bony erosions to be majorly concerned about ? ?Assessment and Plan:  ?Problem List Items Addressed This Visit   ?None ?Visit Diagnoses   ? ? S/P transmetatarsal amputation of foot, left ([place])    -  Primary  ? Relevant Orders  ? DG Foot  Complete Left  ? Other chronic osteomyelitis of left foot ([place])      ? Uncontrolled type 2 diabetes mellitus with hyperglycemia ([place])      ? Left foot pain      ? ?  ?  ? ?-Patient seen and evaluated ?-X-rays reviewed ?-Dressing change performed ?-Applied Surgicel covered with dry sterile dressing to surgical site left foot secured with ACE wrap and stockinet.  Advised patient's husband that he can continue with dressing changes every day using only dry gauze as previous; wound care supplies reordered through [facility] ?-Advised patient to continue with nonweightbearing with use of wheelchair only slight pressure to the heel for transfers only like previous and to continue to be compliant with this to prevent poor wound healing or dehiscence ?-Advised patient to continue with Tylenol for pain like before ?-Continue with rest and elevation for pain and bleeding control  ?-Will plan for dressing change and possibly removing some sutures and staples next visit ? ?Landis Martins, DPM ? ?

## 2022-03-01 ENCOUNTER — Other Ambulatory Visit: Payer: Self-pay | Admitting: Internal Medicine

## 2022-03-02 ENCOUNTER — Other Ambulatory Visit: Payer: Self-pay

## 2022-03-02 DIAGNOSIS — I219 Acute myocardial infarction, unspecified: Secondary | ICD-10-CM | POA: Insufficient documentation

## 2022-03-02 DIAGNOSIS — Z95810 Presence of automatic (implantable) cardiac defibrillator: Secondary | ICD-10-CM | POA: Insufficient documentation

## 2022-03-04 NOTE — Progress Notes (Signed)
?Cardiology Office Note:   ? ?Date:  03/05/2022  ? ?ID:  Jasmine Richardson, DOB Feb 10, 1961, MRN [id number] ? ?PCP:  Pcp, No  ?Cardiologist:  Shirlee More, MD   ? ?Referring MD: Nicholos Johns, MD  ? ? ?ASSESSMENT:   ? ?1. Coronary artery disease involving native coronary artery of native heart with angina pectoris ([place])   ?2. Hypertensive heart disease with chronic diastolic congestive heart failure ([place])   ?3. Ventricular tachycardia   ?4. On amiodarone therapy   ?5. ICD (implantable cardioverter-defibrillator) in place   ?6. Mixed hyperlipidemia   ?7. PAD (peripheral artery disease) ([place])   ? ?PLAN:   ? ?In order of problems listed above: ? ?From cardiology perspective Jasmine Richardson is doing well having no angina after interventions in her current treatment including her dual antiplatelet oral nitrate beta-blocker and statin. ?Heart failure is compensated presently on very low-dose of the loop diuretic on good guideline directed therapy including beta-blocker and SGLT2 inhibitor ARB with recovered ejection fraction ?Stable she has an ICD continue amiodarone no findings of toxicity ?Continue her high intensity statin with diabetes CAD PAD ?Improving after transmetatarsal resection ? ? ?Next appointment: 3 months ? ? ? ?Medication Adjustments/Labs and Tests Ordered: ?Current medicines are reviewed at length with the patient today.  Concerns regarding medicines are outlined above.  ?No orders of the defined types were placed in this encounter. ? ?No orders of the defined types were placed in this encounter. ? ? ?Chief Complaint  ?Patient presents with  ? Follow-up  ? Congestive Heart Failure  ? Coronary Artery Disease  ? ? ?History of Present Illness:   ? ?Jasmine Richardson is a 61 y.o. female with a hx of  CAD with CABG 8756 complicated by postoperative mediastinitis reoperation at [facility] with myocutaneous pectoralis flap closure ACS with PCI and stent native right coronary artery 05/25/2020 with an occlusion of the vein graft  to the right coronary artery and subsequent complex PCI and stent to the coronary artery with shockwave intravascular lithotripsy 06/22/2020 dissection conclusion small caliber first diagonal branch.  Other problems include ventricular tachycardia treated with IV amiodarone and lidocaine conversion to sinus rhythm subsequently loaded with amiodarone and had an ICD placed at [facility] drip at admission.  Other problems include diabetes mellitus hyperlipidemia hypertensive heart disease with chronic diastolic heart failure.  She was admitted to [facility] on 07/20/2021 with chest pain she underwent repeat coronary arteriography anatomy unchanged from the previous catheterization January 2022 left thoracic artery remains widely patent left anterior descending and stent left main coronary artery right coronary artery were widely patent with occlusion of the vein grafts to the right coronary artery left circumflex marginal and diagonal branch.  Ongoing medical therapy was recommended.  She was last seen by me 08/29/2022. ? ?Her last ICD download showed a projected battery life of 101 months normal device and lead function.  Telemetry did not show any episodes of ventricular tachycardia. ? ?She had a TEE [facility] 11/27/2021 for bacteremia no evidence of vegetation on valves or device leads ejection fraction low normal 50 to 55% with normal right ventricular size and function there is no left atrial or left atrial appendage thrombus she did have contrast injection with right to left shunt. ? ?Compliance with diet, lifestyle and medications: Yes ? ?For all of her wound infections and surgeries she really is doing well ?She having no angina edema shortness of breath palpitation or syncope ?She remains on amiodarone  she had labs performed in February including her thyroid with no evidence of toxicity. ?01/23/2022 TSH 4.59 normal ALT potassium 4.7 creatinine 1.04 cholesterol 182 LDL 92 A1c remains  quite elevated at 10.5% hemoglobin 12.0 ?Past Medical History:  ?Diagnosis Date  ? Abnormal EKG 10/23/2021  ? Abnormal mammogram of left breast 09/04/2021  ? Abnormal stress test 10/23/2021  ? Acute chest pain 05/08/2016  ? AICD (automatic cardioverter/defibrillator) present   ? St Jude/Abbott device  ? AKI (acute kidney injury) ([place]) 10/23/2021  ? Angina pectoris ([place]) 09/11/2017  ? Anxiety 10/23/2021  ? Bacteremia 11/22/2021  ? Breast wound, right, subsequent encounter 09/04/2021  ? Burping 05/08/2016  ? Cancer [facility])   ? cervical - hysterectomy  ? Cellulitis of right breast 10/23/2021  ? Cellulitis, wound, post-operative 10/09/2021  ? Chronic diastolic heart failure ([place]) 11/06/2016  ? Coronary artery disease involving native coronary artery of native heart with angina pectoris ([place]) 09/12/2015  ? Overview:  PCI and stent of RCA 2009, last cath 2012 with medical therapy  CABG May 2017  ? Demand ischemia ([place])   ? Diabetic foot infection ([place]) 11/21/2021  ? Diabetic foot ulcer ([place]) 02/16/2020  ? Emphysema lung ([place]) 09/11/2017  ? patient not aware of this dx  ? Essential hypertension 09/12/2015  ? Gangrene ([place]) 10/23/2021  ? GERD (gastroesophageal reflux disease) 05/08/2016  ? Hematoma 10/23/2021  ? Hiatal hernia 10/23/2021  ? Hyperglycemia 10/23/2021  ? Hyperglycemia due to type 2 diabetes mellitus ([place]) 10/23/2021  ? Hyperlipidemia 09/12/2015  ? Hyponatremia 10/23/2021  ? ICD (implantable cardioverter-defibrillator) in place 05/16/2021  ? Infection of great toe 10/23/2021  ? Lactic acidosis 10/23/2021  ? Leukocytosis 10/23/2021  ? Mediastinitis 06/28/2016  ? Morbid obesity ([place]) 05/08/2016  ? Myocardial infarction [facility])   ? NSTEMI (non-ST elevated myocardial infarction) ([place]) 05/22/2020  ? Obesity (BMI 30-39.9) 10/23/2021  ? Perineal abscess 10/23/2021  ? Peripheral vascular disease ([place])   ? S/P CABG (coronary artery bypass graft) 06/03/2016  ? Overview:  The patient underwent sternal reconstruction on 06/21/16 with pec flaps for  mediastinitis from a prior CABG in May 2017. On admission, she was critically ill from sepsis and had altered mental status. She was last seen in clinic on 08/09/16 at which time she was doing well.  ? Severe sepsis ([place]) 06/03/2016  ? Sinus tachycardia 05/08/2016  ? Tobacco use disorder 04/20/2016  ? Overview:  Quit in May 2017.  ? Type 2 diabetes mellitus with foot ulcer ([place]) 05/08/2016  ? Unstable angina ([place]) 05/22/2020  ? Ventricular tachycardia 12/17/2020  ? Wound, surgical, infected 06/06/2016  ? Overview:  sternal  ? ? ?Past Surgical History:  ?Procedure Laterality Date  ? ABDOMINAL AORTOGRAM W/LOWER EXTREMITY N/A 10/11/2021  ? Procedure: ABDOMINAL AORTOGRAM W/LOWER EXTREMITY;  Surgeon: Marty Heck, MD;  Location: [place] CV LAB;  Service: Cardiovascular;  Laterality: N/A;  ? AMPUTATION TOE Right 02/20/2020  ? Procedure: AMPUTATION RIGHT GREAT  TOE;  Surgeon: Felipa Furnace, DPM;  Location: [place];  Service: Podiatry;  Laterality: Right;  ? BLADDER SURGERY    ? BONE BIOPSY Left 02/01/2022  ? Procedure: BONE BIOPSY LEFT FOOT, IRRIGATION AND DEBRIDEMENT;  Surgeon: Landis Martins, DPM;  Location: [place];  Service: Podiatry;  Laterality: Left;  ? BUBBLE STUDY  11/27/2021  ? Procedure: BUBBLE STUDY;  Surgeon: Fay Records, MD;  Location: [place];  Service: Cardiovascular;;  ? CARDIAC CATHETERIZATION    ? CORONARY ARTERY BYPASS GRAFT    ?  CORONARY STENT INTERVENTION N/A 05/25/2020  ? Procedure: CORONARY STENT INTERVENTION;  Surgeon: Wellington Hampshire, MD;  Location: [place] CV LAB;  Service: Cardiovascular;  Laterality: N/A;  ? CORONARY STENT INTERVENTION N/A 06/22/2020  ? Procedure: CORONARY STENT INTERVENTION;  Surgeon: Leonie Man, MD;  Location: [place] CV LAB;  Service: Cardiovascular;  Laterality: N/A;  ? ICD IMPLANT N/A 12/19/2020  ? Procedure: ICD IMPLANT;  Surgeon: Evans Lance, MD;  Location: [place] CV LAB;  Service: Cardiovascular;  Laterality: N/A;  ? INCISION AND DRAINAGE  Right 02/19/2020  ? Procedure: INCISION AND DRAINAGE;  Surgeon: Trula Slade, DPM;  Location: [place];  Service: Podiatry;  Laterality: Right;  Block done by surgeon  ? INTRAVASCULAR ULTRASOUND/IVUS N/A 7/7/

## 2022-03-05 ENCOUNTER — Encounter: Payer: Self-pay | Admitting: Cardiology

## 2022-03-05 ENCOUNTER — Other Ambulatory Visit: Payer: Self-pay

## 2022-03-05 ENCOUNTER — Ambulatory Visit (INDEPENDENT_AMBULATORY_CARE_PROVIDER_SITE_OTHER): Payer: Managed Care, Other (non HMO) | Admitting: Cardiology

## 2022-03-05 VITALS — BP 120/80 | HR 72 | Ht 67.0 in | Wt 228.2 lb

## 2022-03-05 DIAGNOSIS — I472 Ventricular tachycardia, unspecified: Secondary | ICD-10-CM

## 2022-03-05 DIAGNOSIS — I110 Hypertensive heart disease with heart failure: Secondary | ICD-10-CM

## 2022-03-05 DIAGNOSIS — I25119 Atherosclerotic heart disease of native coronary artery with unspecified angina pectoris: Secondary | ICD-10-CM

## 2022-03-05 DIAGNOSIS — Z79899 Other long term (current) drug therapy: Secondary | ICD-10-CM

## 2022-03-05 DIAGNOSIS — E782 Mixed hyperlipidemia: Secondary | ICD-10-CM

## 2022-03-05 DIAGNOSIS — Z95810 Presence of automatic (implantable) cardiac defibrillator: Secondary | ICD-10-CM

## 2022-03-05 DIAGNOSIS — I739 Peripheral vascular disease, unspecified: Secondary | ICD-10-CM

## 2022-03-05 DIAGNOSIS — I5032 Chronic diastolic (congestive) heart failure: Secondary | ICD-10-CM

## 2022-03-05 MED ORDER — CLOPIDOGREL BISULFATE 75 MG PO TABS: 75.0000 mg | ORAL_TABLET | Freq: Every day | ORAL | 3 refills | Status: DC

## 2022-03-05 MED ORDER — ROSUVASTATIN CALCIUM 20 MG PO TABS: 20.0000 mg | ORAL_TABLET | Freq: Every day | ORAL | 3 refills | Status: DC

## 2022-03-05 MED ORDER — ISOSORBIDE MONONITRATE ER 30 MG PO TB24: 30.0000 mg | ORAL_TABLET | Freq: Every day | ORAL | 3 refills | Status: DC

## 2022-03-05 MED ORDER — METOPROLOL SUCCINATE ER 25 MG PO TB24: 25.0000 mg | ORAL_TABLET | Freq: Three times a day (TID) | ORAL | 3 refills | Status: DC

## 2022-03-05 MED ORDER — AMIODARONE HCL 200 MG PO TABS: 200.0000 mg | ORAL_TABLET | Freq: Every day | ORAL | 3 refills | Status: DC

## 2022-03-05 MED ORDER — HUMALOG KWIKPEN 100 UNIT/ML ~~LOC~~ SOPN
10.0000 [IU] | PEN_INJECTOR | Freq: Every day | SUBCUTANEOUS | 12 refills | Status: DC
Start: 2022-03-05 — End: 2022-03-08

## 2022-03-05 MED ORDER — NITROGLYCERIN 0.4 MG SL SUBL: 0.4000 mg | SUBLINGUAL_TABLET | SUBLINGUAL | 6 refills | Status: DC | PRN

## 2022-03-05 MED ORDER — VALSARTAN 80 MG PO TABS: 80.0000 mg | ORAL_TABLET | Freq: Two times a day (BID) | ORAL | 3 refills | Status: DC

## 2022-03-05 NOTE — Patient Instructions (Signed)
Medication Instructions:  ?Your physician recommends that you continue on your current medications as directed. Please refer to the Current Medication list given to you today. ? ?*If you need a refill on your cardiac medications before your next appointment, please call your pharmacy* ? ? ?Lab Work: ?None ordered ?If you have labs (blood work) drawn today and your tests are completely normal, you will receive your results only by: ?MyChart Message (if you have MyChart) OR ?A paper copy in the mail ?If you have any lab test that is abnormal or we need to change your treatment, we will call you to review the results. ? ? ?Testing/Procedures: ?None ordered ? ? ?Follow-Up: ?At [facility], you and your health needs are our priority.  As part of our continuing mission to provide you with exceptional heart care, we have created designated Provider Care Teams.  These Care Teams include your primary Cardiologist (physician) and Advanced Practice Providers (APPs -  Physician Assistants and Nurse Practitioners) who all work together to provide you with the care you need, when you need it. ? ?We recommend signing up for the patient portal called "MyChart".  Sign up information is provided on this After Visit Summary.  MyChart is used to connect with patients for Virtual Visits (Telemedicine).  Patients are able to view lab/test results, encounter notes, upcoming appointments, etc.  Non-urgent messages can be sent to your provider as well.   ?To learn more about what you can do with MyChart, go to [email or web address].   ? ?Your next appointment:   ?4 month(s) ? ?The format for your next appointment:   ?In Person ? ?Provider:   ?Shirlee More, MD{ ? ? ?Other Instructions ?NA  ?

## 2022-03-07 ENCOUNTER — Ambulatory Visit (INDEPENDENT_AMBULATORY_CARE_PROVIDER_SITE_OTHER): Payer: Managed Care, Other (non HMO) | Admitting: Sports Medicine

## 2022-03-07 ENCOUNTER — Encounter: Payer: Self-pay | Admitting: Sports Medicine

## 2022-03-07 ENCOUNTER — Other Ambulatory Visit: Payer: Self-pay

## 2022-03-07 DIAGNOSIS — E1165 Type 2 diabetes mellitus with hyperglycemia: Secondary | ICD-10-CM

## 2022-03-07 DIAGNOSIS — M79672 Pain in left foot: Secondary | ICD-10-CM

## 2022-03-07 DIAGNOSIS — M86672 Other chronic osteomyelitis, left ankle and foot: Secondary | ICD-10-CM

## 2022-03-07 DIAGNOSIS — Z89432 Acquired absence of left foot: Secondary | ICD-10-CM

## 2022-03-07 NOTE — Progress Notes (Signed)
Subjective: ?Jasmine Richardson is a 61 y.o. female patient seen today in office for POV # 5 date of surgery 02/01/2022 left foot irrigation and debridement with bone biopsy and primary closure at TMA stump site essentially a revision.  Patient reports that she is doing good husband has changed dressing with very minimal spotting. ? ?Patient is in the process of getting a new primary care doctor has an appointment on 3/30 as previously noted and has been trying to do better with monitoring sugars and watching what she eats.  Average sugars this week 200. ? ?Patient Active Problem List  ? Diagnosis Date Noted  ? AICD (automatic cardioverter/defibrillator) present 03/02/2022  ? Myocardial infarction ([place]) 03/02/2022  ? Cancer ([place]) 02/28/2022  ? Bacteremia 11/22/2021  ? Diabetic foot infection ([place]) 11/21/2021  ? Abnormal stress test 10/23/2021  ? Abnormal EKG 10/23/2021  ? AKI (acute kidney injury) ([place]) 10/23/2021  ? Anxiety 10/23/2021  ? Cellulitis of right breast 10/23/2021  ? Gangrene ([place]) 10/23/2021  ? Hematoma 10/23/2021  ? Hiatal hernia 10/23/2021  ? Hyperglycemia 10/23/2021  ? Hyperglycemia due to type 2 diabetes mellitus ([place]) 10/23/2021  ? Hyponatremia 10/23/2021  ? Infection of great toe 10/23/2021  ? Lactic acidosis 10/23/2021  ? Leukocytosis 10/23/2021  ? Obesity (BMI 30-39.9) 10/23/2021  ? Perineal abscess 10/23/2021  ? Cellulitis, wound, post-operative 10/09/2021  ? Abnormal mammogram of left breast 09/04/2021  ? Breast wound, right, subsequent encounter 09/04/2021  ? Demand ischemia ([facility])   ? ICD (implantable cardioverter-defibrillator) in place 05/16/2021  ? Ventricular tachycardia 12/17/2020  ? Unstable angina ([place]) 05/22/2020  ? NSTEMI (non-ST elevated myocardial infarction) ([place]) 05/22/2020  ? Diabetic foot ulcer ([place]) 02/16/2020  ? Angina pectoris ([place]) 09/11/2017  ? Emphysema lung ([place]) 09/11/2017  ? Chronic diastolic heart failure ([place]) 11/06/2016  ? Mediastinitis 06/28/2016  ? Wound, surgical,  infected 06/06/2016  ? S/P CABG (coronary artery bypass graft) 06/03/2016  ? Severe sepsis ([place]) 06/03/2016  ? GERD (gastroesophageal reflux disease) 05/08/2016  ? Morbid obesity ([place]) 05/08/2016  ? Type 2 diabetes mellitus with foot ulcer ([place]) 05/08/2016  ? Sinus tachycardia 05/08/2016  ? Acute chest pain 05/08/2016  ? Burping 05/08/2016  ? Peripheral vascular disease ([place]) 05/08/2016  ? Tobacco use disorder 04/20/2016  ? Coronary artery disease involving native coronary artery of native heart with angina pectoris ([place]) 09/12/2015  ? Essential hypertension 09/12/2015  ? Hyperlipidemia 09/12/2015  ? ? ?Current Outpatient Medications on File Prior to Visit  ?Medication Sig Dispense Refill  ? acetaminophen (TYLENOL) 325 MG tablet Take 2 tablets (650 mg total) by mouth every 4 (four) hours as needed for headache or mild pain.    ? amiodarone (PACERONE) 200 MG tablet Take 1 tablet (200 mg total) by mouth daily. 90 tablet 3  ? ascorbic acid (VITAMIN C) 250 MG tablet Take 250 mg by mouth daily.    ? aspirin 81 MG chewable tablet Chew 1 tablet by mouth daily.    ? clopidogrel (PLAVIX) 75 MG tablet Take 1 tablet (75 mg total) by mouth daily. 90 tablet 3  ? dapagliflozin propanediol (FARXIGA) 10 MG TABS tablet Take 10 mg by mouth daily.    ? docusate sodium (COLACE) 100 MG capsule Take 1 capsule (100 mg total) by mouth 2 (two) times daily. 10 capsule 0  ? Evolocumab (REPATHA SURECLICK) 161 MG/ML SOAJ Inject 140 mg into the skin every 14 (fourteen) days.    ? fenofibrate (TRICOR) 145 MG tablet Take 1 tablet by mouth once  daily 30 tablet 0  ? furosemide (LASIX) 20 MG tablet Take 20 mg by mouth daily.    ? HUMALOG KWIKPEN 100 UNIT/ML KwikPen Inject 10-50 Units into the skin at bedtime. Sliding scale 15 mL 12  ? isosorbide mononitrate (IMDUR) 30 MG 24 hr tablet Take 1 tablet (30 mg total) by mouth daily. 90 tablet 3  ? LORazepam (ATIVAN) 1 MG tablet Take 1 mg by mouth as needed for anxiety.    ? metFORMIN (GLUCOPHAGE) 500 MG  tablet Take 500 mg by mouth 2 (two) times daily with a meal.    ? metoprolol succinate (TOPROL-XL) 25 MG 24 hr tablet Take 1 tablet (25 mg total) by mouth in the morning, at noon, and at bedtime. 270 tablet 3  ? nitroGLYCERIN (NITROSTAT) 0.4 MG SL tablet Place 1 tablet (0.4 mg total) under the tongue every 5 (five) minutes as needed for chest pain. 25 tablet 6  ? nystatin (MYCOSTATIN/NYSTOP) powder Apply 1 application topically daily as needed (rash/yeast).    ? omeprazole (PRILOSEC) 20 MG capsule Take 1 capsule by mouth daily.    ? promethazine (PHENERGAN) 25 MG tablet Take 1 tablet (25 mg total) by mouth every 6 (six) hours as needed for nausea or vomiting. 30 tablet 0  ? rosuvastatin (CRESTOR) 20 MG tablet Take 1 tablet (20 mg total) by mouth daily. 90 tablet 3  ? senna-docusate (SENOKOT-S) 8.6-50 MG tablet Take 1 tablet by mouth daily.    ? valsartan (DIOVAN) 80 MG tablet Take 1 tablet (80 mg total) by mouth 2 (two) times daily. 180 tablet 3  ? vitamin B-12 (CYANOCOBALAMIN) 1000 MCG tablet Take 1,000 mcg by mouth daily.    ? vitamin C (ASCORBIC ACID) 500 MG tablet Take 500 mg by mouth daily.    ? Vitamin D, Ergocalciferol, (DRISDOL) 1.25 MG (50000 UNIT) CAPS capsule Take 50,000 Units by mouth every Saturday.    ? ?No current facility-administered medications on file prior to visit.  ? ? ?Allergies  ?Allergen Reactions  ? Vancomycin Anaphylaxis  ? Chlorhexidine Gluconate Itching  ?  Received CHG bath, began itching, required benadryl  ? Cat Hair Extract Other (See Comments)  ?  Sneezing, watery eyes.  ? Tramadol Other (See Comments)  ?  Hallucinations  ? Codeine Rash  ? ? ?Objective: ?There were no vitals filed for this visit. ? ?General: No acute distress, AAOx3  ?Left foot/TMA amputation stump: Sutures and staples intact with no gapping or dehiscence at surgical site, there is decreased bleeding noted from the central incision line area appears to be healing very well with dried blood and scabbing, no  underlying opening and very minimal spotty bleeding noted at the lateral incision line once staple was removed, no warmth no malodor no significant erythema no other clinical signs of infection noted, DP and PT pedal pulses palpable, varicosities to the lower leg on the left, no pain with palpation to calf and no limitation or pain with range of motion to ankle, status post left foot transmetatarsal amputation with revision. ? ?Assessment and Plan:  ?Problem List Items Addressed This Visit   ?None ?Visit Diagnoses   ? ? S/P transmetatarsal amputation of foot, left ([place])    -  Primary  ? Other chronic osteomyelitis of left foot ([place])      ? Uncontrolled type 2 diabetes mellitus with hyperglycemia ([place])      ? Left foot pain      ? ?  ?  ? ?-Patient seen and  evaluated ?-Dressing change performed ?-A few sutures and staples were removed to patient's tolerance ?-Applied Surgicel covered with dry sterile dressing to surgical site left foot secured with ACE wrap and stockinet.  Advised patient's husband that he can continue with dressing changes every day using only dry gauze as previous ?-Advised patient to continue with nonweightbearing with use of wheelchair only slight pressure to the heel for transfers only like previous  ?-Advised patient to continue with Tylenol for pain like before and advised patient prior to next visit to take this medication to help with her suture and staple removal ?-Continue with rest and elevation for pain and bleeding control like before however now it is very minimal ?-Will plan for continuing suture and staple removal at next visit. ? ?Landis Martins, DPM ? ?

## 2022-03-08 ENCOUNTER — Telehealth: Payer: Self-pay | Admitting: Cardiology

## 2022-03-08 MED ORDER — HUMALOG KWIKPEN 100 UNIT/ML ~~LOC~~ SOPN: 10.0000 [IU] | PEN_INJECTOR | Freq: Every day | SUBCUTANEOUS | 12 refills | Status: DC

## 2022-03-08 NOTE — Telephone Encounter (Signed)
?*  STAT* If patient is at the pharmacy, call can be transferred to refill team. ? ? ?1. Which medications need to be refilled? (please list name of each medication and dose if known) HUMALOG KWIKPEN 100 UNIT/ML KwikPen ? ?2. Which pharmacy/location (including street and city if local pharmacy) is medication to be sent to? [facility] [phone] ? ?3. Do they need a 30 day or 90 day supply? 90 day ? ?Patient states if [facility] can [facility].   ?

## 2022-03-09 ENCOUNTER — Other Ambulatory Visit: Payer: Self-pay | Admitting: Internal Medicine

## 2022-03-14 ENCOUNTER — Encounter: Payer: Self-pay | Admitting: Sports Medicine

## 2022-03-14 ENCOUNTER — Ambulatory Visit (INDEPENDENT_AMBULATORY_CARE_PROVIDER_SITE_OTHER): Payer: Managed Care, Other (non HMO) | Admitting: Sports Medicine

## 2022-03-14 DIAGNOSIS — E1165 Type 2 diabetes mellitus with hyperglycemia: Secondary | ICD-10-CM

## 2022-03-14 DIAGNOSIS — Z89432 Acquired absence of left foot: Secondary | ICD-10-CM

## 2022-03-14 DIAGNOSIS — M86672 Other chronic osteomyelitis, left ankle and foot: Secondary | ICD-10-CM

## 2022-03-14 DIAGNOSIS — M79672 Pain in left foot: Secondary | ICD-10-CM

## 2022-03-14 NOTE — Progress Notes (Signed)
Subjective: ?Jasmine Richardson is a 61 y.o. female patient seen today in office for POV #6 date of surgery 02/01/2022 left foot irrigation and debridement with bone biopsy and primary closure at TMA stump site essentially a revision.  Patient reports that she is doing good husband has changed dressing with very minimal spotting/bleeding as previous. ? ?Patient to see new primary doctor on tomorrow. ? ?Recent blood sugar 168. ? ?Patient Active Problem List  ? Diagnosis Date Noted  ? AICD (automatic cardioverter/defibrillator) present 03/02/2022  ? Myocardial infarction ([place]) 03/02/2022  ? Cancer ([place]) 02/28/2022  ? Bacteremia 11/22/2021  ? Diabetic foot infection ([place]) 11/21/2021  ? Abnormal stress test 10/23/2021  ? Abnormal EKG 10/23/2021  ? AKI (acute kidney injury) ([place]) 10/23/2021  ? Anxiety 10/23/2021  ? Cellulitis of right breast 10/23/2021  ? Gangrene ([place]) 10/23/2021  ? Hematoma 10/23/2021  ? Hiatal hernia 10/23/2021  ? Hyperglycemia 10/23/2021  ? Hyperglycemia due to type 2 diabetes mellitus ([place]) 10/23/2021  ? Hyponatremia 10/23/2021  ? Infection of great toe 10/23/2021  ? Lactic acidosis 10/23/2021  ? Leukocytosis 10/23/2021  ? Obesity (BMI 30-39.9) 10/23/2021  ? Perineal abscess 10/23/2021  ? Cellulitis, wound, post-operative 10/09/2021  ? Abnormal mammogram of left breast 09/04/2021  ? Breast wound, right, subsequent encounter 09/04/2021  ? Demand ischemia ([place])   ? ICD (implantable cardioverter-defibrillator) in place 05/16/2021  ? Ventricular tachycardia 12/17/2020  ? Unstable angina ([place]) 05/22/2020  ? NSTEMI (non-ST elevated myocardial infarction) ([place]) 05/22/2020  ? Diabetic foot ulcer ([place]) 02/16/2020  ? Angina pectoris ([place]) 09/11/2017  ? Emphysema lung ([place]) 09/11/2017  ? Chronic diastolic heart failure ([place]) 11/06/2016  ? Mediastinitis 06/28/2016  ? Wound, surgical, infected 06/06/2016  ? S/P CABG (coronary artery bypass graft) 06/03/2016  ? Severe sepsis ([place]) 06/03/2016  ? GERD (gastroesophageal  reflux disease) 05/08/2016  ? Morbid obesity ([place]) 05/08/2016  ? Type 2 diabetes mellitus with foot ulcer ([place]) 05/08/2016  ? Sinus tachycardia 05/08/2016  ? Acute chest pain 05/08/2016  ? Burping 05/08/2016  ? Peripheral vascular disease ([place]) 05/08/2016  ? Tobacco use disorder 04/20/2016  ? Coronary artery disease involving native coronary artery of native heart with angina pectoris ([place]) 09/12/2015  ? Essential hypertension 09/12/2015  ? Hyperlipidemia 09/12/2015  ? ? ?Current Outpatient Medications on File Prior to Visit  ?Medication Sig Dispense Refill  ? acetaminophen (TYLENOL) 325 MG tablet Take 2 tablets (650 mg total) by mouth every 4 (four) hours as needed for headache or mild pain.    ? amiodarone (PACERONE) 200 MG tablet Take 1 tablet (200 mg total) by mouth daily. 90 tablet 3  ? ascorbic acid (VITAMIN C) 250 MG tablet Take 250 mg by mouth daily.    ? aspirin 81 MG chewable tablet Chew 1 tablet by mouth daily.    ? clopidogrel (PLAVIX) 75 MG tablet Take 1 tablet (75 mg total) by mouth daily. 90 tablet 3  ? dapagliflozin propanediol (FARXIGA) 10 MG TABS tablet Take 10 mg by mouth daily.    ? docusate sodium (COLACE) 100 MG capsule Take 1 capsule (100 mg total) by mouth 2 (two) times daily. 10 capsule 0  ? Evolocumab (REPATHA SURECLICK) 465 MG/ML SOAJ Inject 140 mg into the skin every 14 (fourteen) days.    ? fenofibrate (TRICOR) 145 MG tablet Take 1 tablet by mouth once daily 30 tablet 0  ? furosemide (LASIX) 20 MG tablet Take 20 mg by mouth daily.    ? HUMALOG KWIKPEN 100 UNIT/ML KwikPen Inject  10-50 Units into the skin at bedtime. Sliding scale 15 mL 12  ? isosorbide mononitrate (IMDUR) 30 MG 24 hr tablet Take 1 tablet (30 mg total) by mouth daily. 90 tablet 3  ? LORazepam (ATIVAN) 1 MG tablet Take 1 mg by mouth as needed for anxiety.    ? metFORMIN (GLUCOPHAGE) 500 MG tablet Take 500 mg by mouth 2 (two) times daily with a meal.    ? metoprolol succinate (TOPROL-XL) 25 MG 24 hr tablet Take 1 tablet (25  mg total) by mouth in the morning, at noon, and at bedtime. 270 tablet 3  ? nitroGLYCERIN (NITROSTAT) 0.4 MG SL tablet Place 1 tablet (0.4 mg total) under the tongue every 5 (five) minutes as needed for chest pain. 25 tablet 6  ? nystatin (MYCOSTATIN/NYSTOP) powder Apply 1 application topically daily as needed (rash/yeast).    ? omeprazole (PRILOSEC) 20 MG capsule Take 1 capsule by mouth daily.    ? promethazine (PHENERGAN) 25 MG tablet Take 1 tablet (25 mg total) by mouth every 6 (six) hours as needed for nausea or vomiting. 30 tablet 0  ? rosuvastatin (CRESTOR) 20 MG tablet Take 1 tablet (20 mg total) by mouth daily. 90 tablet 3  ? senna-docusate (SENOKOT-S) 8.6-50 MG tablet Take 1 tablet by mouth daily.    ? valsartan (DIOVAN) 80 MG tablet Take 1 tablet (80 mg total) by mouth 2 (two) times daily. 180 tablet 3  ? vitamin B-12 (CYANOCOBALAMIN) 1000 MCG tablet Take 1,000 mcg by mouth daily.    ? vitamin C (ASCORBIC ACID) 500 MG tablet Take 500 mg by mouth daily.    ? Vitamin D, Ergocalciferol, (DRISDOL) 1.25 MG (50000 UNIT) CAPS capsule Take 50,000 Units by mouth every Saturday.    ? ?No current facility-administered medications on file prior to visit.  ? ? ?Allergies  ?Allergen Reactions  ? Vancomycin Anaphylaxis  ? Chlorhexidine Gluconate Itching  ?  Received CHG bath, began itching, required benadryl  ? Cat Hair Extract Other (See Comments)  ?  Sneezing, watery eyes.  ? Tramadol Other (See Comments)  ?  Hallucinations  ? Codeine Rash  ? ? ?Objective: ?There were no vitals filed for this visit. ? ?General: No acute distress, AAOx3  ?Left foot/TMA amputation stump: Sutures and staples intact with very minimal gapping at the lateral aspect of the incision but no complete dehiscence at surgical site, there is decreased bleeding noted from the central and lateral incision line area appears to be healing very well with dried blood and scabbing in multiple areas,  no warmth no malodor no significant erythema no other  clinical signs of infection noted, DP and PT pedal pulses palpable, varicosities to the lower leg on the left, no pain with palpation to calf and no limitation or pain with range of motion to ankle, status post left foot transmetatarsal amputation with revision. ? ?Assessment and Plan:  ?Problem List Items Addressed This Visit   ?None ?Visit Diagnoses   ? ? S/P transmetatarsal amputation of foot, left ([place])    -  Primary  ? Other chronic osteomyelitis of left foot ([place])      ? Uncontrolled type 2 diabetes mellitus with hyperglycemia ([place])      ? Left foot pain      ? ?  ?  ? ?-Patient seen and evaluated ?-Dressing change performed ?-A few sutures and staples were removed to patient's tolerance ?-Applied Steri-Strips to the lateral aspects of the incision where there was a small gap  after the staples were removed at this area then covered with Surgicel and dry sterile dressing to surgical site left foot secured with ACE wrap and stockinet.  Advised patient's husband that he can continue with dressing changes every day using only dry gauze as previous and to notify office if there is any increased bleeding or further dehiscence ?-Advised patient to continue with nonweightbearing with use of wheelchair only slight pressure to the heel for transfers only like previous  ?-Advised patient to continue with Tylenol for pain like before and advised patient prior to next visit to take this medication to help with [place] continuing her suture and staple removal ?-Continue with rest and elevation for pain and edema control ?-Will plan for continuing suture and staple removal gradually should not take them all out at 1 time since patient is at risk of dehiscence or further wound complications since this is a revision at next visit. ? ?Landis Martins, DPM ? ?

## 2022-03-15 ENCOUNTER — Encounter: Payer: Self-pay | Admitting: Physician Assistant

## 2022-03-15 ENCOUNTER — Ambulatory Visit: Payer: Managed Care, Other (non HMO) | Admitting: Physician Assistant

## 2022-03-15 VITALS — BP 100/62 | HR 76 | Temp 97.8°F | Ht 67.0 in | Wt 224.0 lb

## 2022-03-15 DIAGNOSIS — L97509 Non-pressure chronic ulcer of other part of unspecified foot with unspecified severity: Secondary | ICD-10-CM

## 2022-03-15 DIAGNOSIS — E1165 Type 2 diabetes mellitus with hyperglycemia: Secondary | ICD-10-CM

## 2022-03-15 DIAGNOSIS — F419 Anxiety disorder, unspecified: Secondary | ICD-10-CM

## 2022-03-15 DIAGNOSIS — I25119 Atherosclerotic heart disease of native coronary artery with unspecified angina pectoris: Secondary | ICD-10-CM | POA: Diagnosis not present

## 2022-03-15 DIAGNOSIS — Z794 Long term (current) use of insulin: Secondary | ICD-10-CM

## 2022-03-15 DIAGNOSIS — E782 Mixed hyperlipidemia: Secondary | ICD-10-CM

## 2022-03-15 DIAGNOSIS — I10 Essential (primary) hypertension: Secondary | ICD-10-CM | POA: Diagnosis not present

## 2022-03-15 DIAGNOSIS — Z95810 Presence of automatic (implantable) cardiac defibrillator: Secondary | ICD-10-CM

## 2022-03-15 DIAGNOSIS — R000 Tachycardia, unspecified: Secondary | ICD-10-CM

## 2022-03-15 DIAGNOSIS — E11621 Type 2 diabetes mellitus with foot ulcer: Secondary | ICD-10-CM

## 2022-03-15 DIAGNOSIS — K219 Gastro-esophageal reflux disease without esophagitis: Secondary | ICD-10-CM | POA: Diagnosis not present

## 2022-03-15 MED ORDER — VITAMIN D (ERGOCALCIFEROL) 1.25 MG (50000 UNIT) PO CAPS: 50000.0000 [IU] | ORAL_CAPSULE | ORAL | 1 refills | Status: DC

## 2022-03-15 MED ORDER — OMEPRAZOLE 20 MG PO CPDR: 20.0000 mg | DELAYED_RELEASE_CAPSULE | Freq: Every day | ORAL | 1 refills | Status: DC

## 2022-03-16 ENCOUNTER — Other Ambulatory Visit: Payer: Self-pay | Admitting: Physician Assistant

## 2022-03-16 DIAGNOSIS — R899 Unspecified abnormal finding in specimens from other organs, systems and tissues: Secondary | ICD-10-CM

## 2022-03-16 LAB — COMPREHENSIVE METABOLIC PANEL
ALT: 19 IU/L (ref 0–32)
AST: 19 IU/L (ref 0–40)
Albumin/Globulin Ratio: 1.4 (ref 1.2–2.2)
Albumin: 4.6 g/dL (ref 3.8–4.8)
Alkaline Phosphatase: 52 IU/L (ref 44–121)
BUN/Creatinine Ratio: 22 (ref 12–28)
BUN: 41 mg/dL — ABNORMAL HIGH (ref 8–27)
Bilirubin Total: 0.2 mg/dL (ref 0.0–1.2)
CO2: 23 mmol/L (ref 20–29)
Calcium: 10.1 mg/dL (ref 8.7–10.3)
Chloride: 100 mmol/L (ref 96–106)
Creatinine, Ser: 1.88 mg/dL — ABNORMAL HIGH (ref 0.57–1.00)
Globulin, Total: 3.3 g/dL (ref 1.5–4.5)
Glucose: 188 mg/dL — ABNORMAL HIGH (ref 70–99)
Potassium: 5.1 mmol/L (ref 3.5–5.2)
Sodium: 139 mmol/L (ref 134–144)
Total Protein: 7.9 g/dL (ref 6.0–8.5)
eGFR: 30 mL/min/{1.73_m2} — ABNORMAL LOW (ref >59)

## 2022-03-16 LAB — CBC WITH DIFFERENTIAL/PLATELET
Basophils Absolute: 0.1 10*3/uL (ref 0.0–0.2)
Basos: 1 %
EOS (ABSOLUTE): 0.2 10*3/uL (ref 0.0–0.4)
Eos: 2 %
Hematocrit: 35.9 % (ref 34.0–46.6)
Hemoglobin: 12.2 g/dL (ref 11.1–15.9)
Immature Grans (Abs): 0 10*3/uL (ref 0.0–0.1)
Immature Granulocytes: 1 %
Lymphocytes Absolute: 2.4 10*3/uL (ref 0.7–3.1)
Lymphs: 29 %
MCH: 28.8 pg (ref 26.6–33.0)
MCHC: 34 g/dL (ref 31.5–35.7)
MCV: 85 fL (ref 79–97)
Monocytes Absolute: 0.5 10*3/uL (ref 0.1–0.9)
Monocytes: 6 %
Neutrophils Absolute: 5.1 10*3/uL (ref 1.4–7.0)
Neutrophils: 61 %
Platelets: 317 10*3/uL (ref 150–450)
RBC: 4.24 x10E6/uL (ref 3.77–5.28)
RDW: 14.5 % (ref 11.7–15.4)
WBC: 8.3 10*3/uL (ref 3.4–10.8)

## 2022-03-19 ENCOUNTER — Other Ambulatory Visit: Payer: Self-pay | Admitting: Physician Assistant

## 2022-03-19 ENCOUNTER — Ambulatory Visit (INDEPENDENT_AMBULATORY_CARE_PROVIDER_SITE_OTHER): Payer: Managed Care, Other (non HMO) | Admitting: Podiatry

## 2022-03-19 ENCOUNTER — Other Ambulatory Visit: Payer: Managed Care, Other (non HMO)

## 2022-03-19 ENCOUNTER — Telehealth: Payer: Self-pay | Admitting: Sports Medicine

## 2022-03-19 DIAGNOSIS — R899 Unspecified abnormal finding in specimens from other organs, systems and tissues: Secondary | ICD-10-CM

## 2022-03-19 DIAGNOSIS — Z91199 Patient's noncompliance with other medical treatment and regimen due to unspecified reason: Secondary | ICD-10-CM

## 2022-03-19 NOTE — Progress Notes (Signed)
No show

## 2022-03-19 NOTE — Progress Notes (Signed)
? ?New Patient Office Visit ? ?Subjective:  ?[name], female    DOB: 1961-08-05  Age: 61 y.o. MRN: [id number] ? ?CC:  ?Chief Complaint  ?Patient presents with  ? Diabetes  ? Gastroesophageal Reflux  ? ? ?HPI ?Jasmine Richardson presents for diabetes ?Jasmine Richardson has a history of type 2 Diabetes Mellitus for 10 years. Current treatment includes farxiga 31m, glucophage 5051mbid, tresiba 30 units daily and humalog sliding scale She denies hypoglycemic episodes. She states blood glucose levels at home range from 100 to 200s. Pt is seeing Dr StCannon Kettleor chronic foot problems including history of amputation or right great toe and amputation of left forefoot/toes - last hgb a1c was 11/23/21 at 10.7 and I recommend to increase her tresiba to 34 units for one week then increase 4 units weekly until glucose ranging under 200 ?Pt would like referral to dietician ? ?Pt with history of Vtach - she does follow with Dr MuBettina Gaviand has upcoming appt next week. - she has an implanted defibrillator and currently on amiodarone 20018mShe is taking plavix 73m25m ? ?Pt with history of hyperlipidemia ?Currently on Repatha as well as crestor 20mg37mand tricor 145mg 33mt with history of vit D deficiency - due for labwork and refill of medication ? ?Pt presents for follow up of hypertension. The patient is tolerating the medication well without side effects. Compliance with treatment has been good; including taking medication as directed , she is currently on diovan 80mg q2mtoprol XL 25mg qd25msix 20mg qd 61mt with history of anxiety - takes ativan 1mg prn ?55mt with history of GERD - symptoms stable on prilosec 20mg qd - 79mests refill of medication ?Lab Results  ?Component Value Date  ? HGBA1C 10.7 (H) 11/23/2021  ? HGBA1C 12.8 (H) 10/10/2021  ? HGBA1C 12.0 (H) 07/20/2021  ?  ? ?Past Medical History:  ?Diagnosis Date  ? Abnormal EKG 10/23/2021  ? Abnormal mammogram of left breast 09/04/2021  ? Abnormal stress test  10/23/2021  ? Acute chest pain 05/08/2016  ? AICD (automatic cardioverter/defibrillator) present   ? St Jude/Abbott device  ? AKI (acute kidney injury) (HCC) 11/7/2[place]  ? Angina pectoris (HCC) 09/26/[place]8  ? Anxiety 10/23/2021  ? Bacteremia 11/22/2021  ? Breast wound, right, subsequent encounter 09/04/2021  ? Burping 05/08/2016  ? Cancer (HCC)   ? [facility]  ? Cellulitis of right breast 10/23/2021  ? Cellulitis, wound, post-operative 10/09/2021  ? Chronic diastolic heart failure (HCC) 11/21/[place]7  ? Coronary artery disease involving native coronary artery of native heart with angina pectoris (HCC) 09/26/[place]6  ? Overview:  PCI and stent of RCA 2009, last cath 2012 with medical therapy  CABG May 2017  ? Demand ischemia (HCC)   ? DiLibertytic foot infection (HCC) 12/6/2[place]  ? Diabetic foot ulcer (HCC) 3/2/20[place] ? Emphysema lung (HCC) 09/26/[place]8  ? patient not aware of this dx  ? Essential hypertension 09/12/2015  ? Gangrene (HCC) 11/7/2[place]  ? GERD (gastroesophageal reflux disease) 05/08/2016  ? Hematoma 10/23/2021  ? Hiatal hernia 10/23/2021  ? Hyperglycemia 10/23/2021  ? Hyperglycemia due to type 2 diabetes mellitus (HCC) 11/7/2[place]  ? Hyperlipidemia 09/12/2015  ? Hyponatremia 10/23/2021  ? ICD (implantable cardioverter-defibrillator) in place 05/16/2021  ? Infection of great toe 10/23/2021  ? Lactic acidosis 10/23/2021  ? Leukocytosis 10/23/2021  ? Mediastinitis 06/28/2016  ? Morbid obesity (HCC) 05/23/[place]7  ? Myocardial infarction (HCC)   ? [facility]non-[facility] elevated myocardial  infarction[facility] ([place]) 05/22/2020  ? Obesity (BMI 30-39.9) 10/23/2021  ? Perineal abscess 10/23/2021  ? Peripheral vascular disease ([place])   ? S/P CABG (coronary artery bypass graft) 06/03/2016  ? Overview:  The patient underwent sternal reconstruction on 06/21/16 with pec flaps for mediastinitis from a prior CABG in May 2017. On admission, she was critically ill from sepsis and had altered mental status. She was last seen in clinic on 08/09/16 at which time she  was doing well.  ? Severe sepsis ([place]) 06/03/2016  ? Sinus tachycardia 05/08/2016  ? Tobacco use disorder 04/20/2016  ? Overview:  Quit in May 2017.  ? Type 2 diabetes mellitus with foot ulcer ([place]) 05/08/2016  ? Unstable angina ([place]) 05/22/2020  ? Ventricular tachycardia 12/17/2020  ? Wound, surgical, infected 06/06/2016  ? Overview:  sternal  ? ? ?Past Surgical History:  ?Procedure Laterality Date  ? ABDOMINAL AORTOGRAM W/LOWER EXTREMITY N/A 10/11/2021  ? Procedure: ABDOMINAL AORTOGRAM W/LOWER EXTREMITY;  Surgeon: Marty Heck, MD;  Location: [place] [facility];  Service: Cardiovascular;  Laterality: N/A;  ? AMPUTATION TOE Right 02/20/2020  ? Procedure: AMPUTATION RIGHT GREAT  TOE;  Surgeon: Felipa Furnace, DPM;  Location: [place];  Service: Podiatry;  Laterality: Right;  ? BLADDER SURGERY    ? BONE BIOPSY Left 02/01/2022  ? Procedure: BONE BIOPSY LEFT FOOT, IRRIGATION AND DEBRIDEMENT;  Surgeon: Landis Martins, DPM;  Location: [place];  Service: Podiatry;  Laterality: Left;  ? BUBBLE STUDY  11/27/2021  ? Procedure: BUBBLE STUDY;  Surgeon: Fay Records, MD;  Location: [place];  Service: Cardiovascular;;  ? CARDIAC CATHETERIZATION    ? CORONARY ARTERY BYPASS GRAFT    ? CORONARY STENT INTERVENTION N/A 05/25/2020  ? Procedure: CORONARY STENT INTERVENTION;  Surgeon: Wellington Hampshire, MD;  Location: [place] [facility];  Service: Cardiovascular;  Laterality: N/A;  ? CORONARY STENT INTERVENTION N/A 06/22/2020  ? Procedure: CORONARY STENT INTERVENTION;  Surgeon: Leonie Man, MD;  Location: [place] [facility];  Service: Cardiovascular;  Laterality: N/A;  ? ICD IMPLANT N/A 12/19/2020  ? Procedure: ICD IMPLANT;  Surgeon: Evans Lance, MD;  Location: [place] [facility];  Service: Cardiovascular;  Laterality: N/A;  ? INCISION AND DRAINAGE Right 02/19/2020  ? Procedure: INCISION AND DRAINAGE;  Surgeon: Trula Slade, DPM;  Location: [place];  Service: Podiatry;  Laterality: Right;  Block done by surgeon  ? INTRAVASCULAR  ULTRASOUND/IVUS N/A 06/22/2020  ? Procedure: Intravascular Ultrasound/IVUS;  Surgeon: Leonie Man, MD;  Location: [place] [facility];  Service: Cardiovascular;  Laterality: N/A;  ? IRRIGATION AND DEBRIDEMENT FOOT Left 10/10/2021  ? Procedure: IRRIGATION AND DEBRIDEMENT FOOT;  Surgeon: Landis Martins, DPM;  Location: [place];  Service: Podiatry;  Laterality: Left;  ? IRRIGATION AND DEBRIDEMENT FOOT Left 11/23/2021  ? Procedure: IRRIGATION AND DEBRIDEMENT FOOT;  Surgeon: Lorenda Peck, MD;  Location: [place];  Service: Podiatry;  Laterality: Left;  will do local block  ? LEFT HEART CATH AND CORS/GRAFTS ANGIOGRAPHY N/A 05/23/2020  ? Procedure: LEFT HEART CATH AND CORS/GRAFTS ANGIOGRAPHY;  Surgeon: Nelva Bush, MD;  Location: [place] [facility];  Service: Cardiovascular;  Laterality: N/A;  ? LEFT HEART CATH AND CORS/GRAFTS ANGIOGRAPHY N/A 12/19/2020  ? Procedure: LEFT HEART CATH AND CORS/GRAFTS ANGIOGRAPHY;  Surgeon: Burnell Blanks, MD;  Location: [place] [facility];  Service: Cardiovascular;  Laterality: N/A;  ? LEFT HEART CATH AND CORS/GRAFTS ANGIOGRAPHY N/A 07/21/2021  ? Procedure: LEFT HEART CATH AND CORS/GRAFTS ANGIOGRAPHY;  Surgeon: Nelva Bush, MD;  Location: [place] [facility];  Service: Cardiovascular;  Laterality: N/A;  ? METATARSAL HEAD EXCISION Right 05/02/2020  ? Procedure: FIRST METATARSAL HEAD RESECTION; RIGHT FOOT WOUND CLOSURE;  Surgeon: Landis Martins, DPM;  Location: [place];  Service: Podiatry;  Laterality: Right;  MAC W/LOCAL  ? PERIPHERAL VASCULAR BALLOON ANGIOPLASTY Left 10/11/2021  ? Procedure: PERIPHERAL VASCULAR BALLOON ANGIOPLASTY;  Surgeon: Marty Heck, MD;  Location: [place] [facility];  Service: Cardiovascular;  Laterality: Left;  Anterior Tibial Artery  ? TEE WITHOUT CARDIOVERSION N/A 11/27/2021  ? Procedure: TRANSESOPHAGEAL ECHOCARDIOGRAM (TEE);  Surgeon: Fay Records, MD;  [facility];  Service: Cardiovascular;  Laterality: N/A;  ?  TRANSMETATARSAL AMPUTATION Left 11/23/2021  ? Procedure: TRANSMETATARSAL AMPUTATION;  Surgeon: Lorenda Peck, MD;  Location: [place];  Service: Podiatry;  Laterality: Left;  ? TUBAL LIGATION    ? ? ?Family Hist

## 2022-03-19 NOTE — Telephone Encounter (Signed)
Pt called and needs a new order for supplies from [facility]. She is needing gauzes (2) please as she uses 4 at a time, combined avd pads, and sterile bandages roll, saline.. Please call if nay questions. Pt aware Dr Cannon Kettle out of office today so it maybe tomorrow before contacted. Pt stated she has enough for 2 more days. ?

## 2022-03-20 ENCOUNTER — Ambulatory Visit (INDEPENDENT_AMBULATORY_CARE_PROVIDER_SITE_OTHER): Payer: Managed Care, Other (non HMO)

## 2022-03-20 ENCOUNTER — Telehealth: Payer: Self-pay | Admitting: Sports Medicine

## 2022-03-20 ENCOUNTER — Encounter: Payer: Self-pay | Admitting: Physician Assistant

## 2022-03-20 ENCOUNTER — Telehealth: Payer: Self-pay

## 2022-03-20 DIAGNOSIS — I472 Ventricular tachycardia, unspecified: Secondary | ICD-10-CM

## 2022-03-20 LAB — COMPREHENSIVE METABOLIC PANEL
ALT: 15 IU/L (ref 0–32)
AST: 17 IU/L (ref 0–40)
Albumin/Globulin Ratio: 1.3 (ref 1.2–2.2)
Albumin: 4.5 g/dL (ref 3.8–4.8)
Alkaline Phosphatase: 47 IU/L (ref 44–121)
BUN/Creatinine Ratio: 24 (ref 12–28)
BUN: 40 mg/dL — ABNORMAL HIGH (ref 8–27)
Bilirubin Total: 0.2 mg/dL (ref 0.0–1.2)
CO2: 20 mmol/L (ref 20–29)
Calcium: 10.2 mg/dL (ref 8.7–10.3)
Chloride: 103 mmol/L (ref 96–106)
Creatinine, Ser: 1.64 mg/dL — ABNORMAL HIGH (ref 0.57–1.00)
Globulin, Total: 3.4 g/dL (ref 1.5–4.5)
Glucose: 156 mg/dL — ABNORMAL HIGH (ref 70–99)
Potassium: 5.3 mmol/L — ABNORMAL HIGH (ref 3.5–5.2)
Sodium: 140 mmol/L (ref 134–144)
Total Protein: 7.9 g/dL (ref 6.0–8.5)
eGFR: 35 mL/min/{1.73_m2} — ABNORMAL LOW (ref >59)

## 2022-03-20 NOTE — Telephone Encounter (Signed)
Patient is overdue for colonoscopy. Last done 12/06/2015, 3 year fu was recommended. Dr Misenheimer's office stated they did send a letter that she was overdue but an appointment was never made.  ? ?Jasmine Richardson 03/20/22 4:31 PM ? ?

## 2022-03-20 NOTE — Telephone Encounter (Signed)
Attempted to call patient. No answer, left VM to return call.  ? ?Jasmine Richardson 03/20/22 4:49 PM ?c ?

## 2022-03-20 NOTE — Telephone Encounter (Signed)
See note to Dr Cannon Kettle about pt missing pov appt. ?

## 2022-03-20 NOTE — Telephone Encounter (Signed)
Called and left message relaying Dr Leeanne Rio message about the orders she gave pt were for 90 day supply for [facility] and to call them to order additional supplies. ?

## 2022-03-20 NOTE — Telephone Encounter (Signed)
Pt called and missed her pov # 6 with Dr Blenda Mounts yesterday, She just realized it. She is scheduled to see Dr Cannon Kettle 4.12 for pov # 7. She is asking if it is ok that she missed the appt for yesterday. I did offer to see if we can get her in [place] but she is saying it is too far away. ?

## 2022-03-20 NOTE — Telephone Encounter (Signed)
Notify pt overdue for colonoscopy - was due in 2019 --- highly recommend pt schedule ?Who would she like to see? ?

## 2022-03-21 ENCOUNTER — Other Ambulatory Visit: Payer: Self-pay | Admitting: Physician Assistant

## 2022-03-21 DIAGNOSIS — Z1211 Encounter for screening for malignant neoplasm of colon: Secondary | ICD-10-CM

## 2022-03-21 LAB — CUP PACEART REMOTE DEVICE CHECK
Battery Remaining Longevity: 98 mo
Battery Remaining Percentage: 87 %
Battery Voltage: 3.01 V
Brady Statistic AP VP Percent: 1 %
Brady Statistic AP VS Percent: 2.1 %
Brady Statistic AS VP Percent: 1 %
Brady Statistic AS VS Percent: 98 %
Brady Statistic RA Percent Paced: 2.1 %
Brady Statistic RV Percent Paced: 1 %
Date Time Interrogation Session: 20230404043726
HighPow Impedance: 75 Ohm
Implantable Lead Implant Date: 20220103
Implantable Lead Implant Date: 20220103
Implantable Lead Location: 753859
Implantable Lead Location: 753860
Implantable Pulse Generator Implant Date: 20220103
Lead Channel Impedance Value: 360 Ohm
Lead Channel Impedance Value: 430 Ohm
Lead Channel Pacing Threshold Amplitude: 0.75 V
Lead Channel Pacing Threshold Amplitude: 1 V
Lead Channel Pacing Threshold Pulse Width: 0.5 ms
Lead Channel Pacing Threshold Pulse Width: 0.5 ms
Lead Channel Sensing Intrinsic Amplitude: 12 mV
Lead Channel Sensing Intrinsic Amplitude: 4.8 mV
Lead Channel Setting Pacing Amplitude: 2 V
Lead Channel Setting Pacing Amplitude: 2.5 V
Lead Channel Setting Pacing Pulse Width: 0.5 ms
Lead Channel Setting Sensing Sensitivity: 0.5 mV
Pulse Gen Serial Number: 111037319

## 2022-03-21 NOTE — Telephone Encounter (Signed)
She would like to return to [facility] but struggles with the expense. Referral can be placed and she will speak with them about expense.  ? ?Also went over labs again. She is questioning fi there is anything we can do to help kidneys without seeing specialist. She is concerned about expense. She would like to take care of herself but unfortunately does not have the money for specialists. Please advise.  ? ?Jasmine Richardson 03/21/22 3:20 PM ? ?

## 2022-03-21 NOTE — Telephone Encounter (Signed)
Making health food choices and managing her diabetes better will help to improve kidney function ?Stay hydrated ?Will evaluate again at follow up visit ?Will refer to Dr Lyda Jester ?

## 2022-03-22 ENCOUNTER — Other Ambulatory Visit: Payer: Self-pay

## 2022-03-22 MED ORDER — METFORMIN HCL 500 MG PO TABS: 500.0000 mg | ORAL_TABLET | Freq: Two times a day (BID) | ORAL | 1 refills | Status: DC

## 2022-03-22 NOTE — Telephone Encounter (Signed)
Patient called requesting refill of Metformin sent to [facility] in [place] ?

## 2022-03-22 NOTE — Telephone Encounter (Signed)
Relayed message from Dr Cannon Kettle and she asked if she should come by the office tomorrow to have the bandage changed and I explained we are closed for the holiday to just have her husband change the bandages like he has been doing until the appt next week. ?

## 2022-03-22 NOTE — Telephone Encounter (Signed)
[name].  ? ?Jasmine Richardson, [place] 03/22/22 4:10 PM ? ?

## 2022-03-24 ENCOUNTER — Other Ambulatory Visit: Payer: Self-pay | Admitting: Cardiology

## 2022-03-26 ENCOUNTER — Ambulatory Visit (INDEPENDENT_AMBULATORY_CARE_PROVIDER_SITE_OTHER): Payer: Managed Care, Other (non HMO) | Admitting: Podiatry

## 2022-03-26 ENCOUNTER — Encounter: Payer: Self-pay | Admitting: Podiatry

## 2022-03-26 DIAGNOSIS — Z89432 Acquired absence of left foot: Secondary | ICD-10-CM

## 2022-03-26 DIAGNOSIS — E1165 Type 2 diabetes mellitus with hyperglycemia: Secondary | ICD-10-CM

## 2022-03-26 NOTE — Progress Notes (Signed)
Subjective: ?Jasmine Richardson is a 61 y.o. female patient seen today in office for POV #7  date of surgery 02/01/2022 left foot irrigation and debridement with bone biopsy and primary closure at TMA stump site essentially a revision.  Relates she is doing well. She did miss her appointment with me last week and was concerned about one area of the incision and wanted to come in to have it checked. Denies any n/v/f/c.  ? ? ? ?Patient Active Problem List  ? Diagnosis Date Noted  ? AICD (automatic cardioverter/defibrillator) present 03/02/2022  ? Myocardial infarction ([place]) 03/02/2022  ? Cancer ([place]) 02/28/2022  ? Bacteremia 11/22/2021  ? Diabetic foot infection ([place]) 11/21/2021  ? Abnormal stress test 10/23/2021  ? Abnormal EKG 10/23/2021  ? AKI (acute kidney injury) ([place]) 10/23/2021  ? Anxiety 10/23/2021  ? Cellulitis of right breast 10/23/2021  ? Gangrene ([place]) 10/23/2021  ? Hematoma 10/23/2021  ? Hiatal hernia 10/23/2021  ? Hyperglycemia 10/23/2021  ? Hyperglycemia due to type 2 diabetes mellitus ([place]) 10/23/2021  ? Hyponatremia 10/23/2021  ? Infection of great toe 10/23/2021  ? Lactic acidosis 10/23/2021  ? Leukocytosis 10/23/2021  ? Obesity (BMI 30-39.9) 10/23/2021  ? Perineal abscess 10/23/2021  ? Cellulitis, wound, post-operative 10/09/2021  ? Abnormal mammogram of left breast 09/04/2021  ? Breast wound, right, subsequent encounter 09/04/2021  ? Demand ischemia ([place])   ? ICD (implantable cardioverter-defibrillator) in place 05/16/2021  ? Ventricular tachycardia 12/17/2020  ? Unstable angina ([place]) 05/22/2020  ? NSTEMI (non-ST elevated myocardial infarction) ([place]) 05/22/2020  ? Diabetic foot ulcer ([place]) 02/16/2020  ? Angina pectoris ([place]) 09/11/2017  ? Emphysema lung ([place]) 09/11/2017  ? Chronic diastolic heart failure ([place]) 11/06/2016  ? Mediastinitis 06/28/2016  ? Wound, surgical, infected 06/06/2016  ? S/P CABG (coronary artery bypass graft) 06/03/2016  ? Severe sepsis ([place]) 06/03/2016  ? GERD (gastroesophageal  reflux disease) 05/08/2016  ? Morbid obesity ([place]) 05/08/2016  ? Type 2 diabetes mellitus with foot ulcer ([place]) 05/08/2016  ? Sinus tachycardia 05/08/2016  ? Acute chest pain 05/08/2016  ? Burping 05/08/2016  ? Peripheral vascular disease ([place]) 05/08/2016  ? Tobacco use disorder 04/20/2016  ? Coronary artery disease involving native coronary artery of native heart with angina pectoris ([place]) 09/12/2015  ? Essential hypertension 09/12/2015  ? Hyperlipidemia 09/12/2015  ? ? ?Current Outpatient Medications on File Prior to Visit  ?Medication Sig Dispense Refill  ? acetaminophen (TYLENOL) 325 MG tablet Take 2 tablets (650 mg total) by mouth every 4 (four) hours as needed for headache or mild pain.    ? amiodarone (PACERONE) 200 MG tablet Take 1 tablet (200 mg total) by mouth daily. 90 tablet 3  ? aspirin 81 MG chewable tablet Chew 1 tablet by mouth daily.    ? clopidogrel (PLAVIX) 75 MG tablet Take 1 tablet (75 mg total) by mouth daily. 90 tablet 3  ? dapagliflozin propanediol (FARXIGA) 10 MG TABS tablet Take 10 mg by mouth daily.    ? docusate sodium (COLACE) 100 MG capsule Take 1 capsule (100 mg total) by mouth 2 (two) times daily. 10 capsule 0  ? Evolocumab (REPATHA SURECLICK) 417 MG/ML SOAJ Inject 140 mg into the skin every 14 (fourteen) days.    ? fenofibrate (TRICOR) 145 MG tablet Take 1 tablet by mouth once daily 30 tablet 0  ? furosemide (LASIX) 20 MG tablet Take 20 mg by mouth daily.    ? HUMALOG KWIKPEN 100 UNIT/ML KwikPen Inject 10-50 Units into the skin at bedtime. Sliding scale  15 mL 12  ? insulin degludec (TRESIBA FLEXTOUCH) 100 UNIT/ML FlexTouch Pen Inject 34 Units into the skin daily.    ? isosorbide mononitrate (IMDUR) 30 MG 24 hr tablet Take 1 tablet (30 mg total) by mouth daily. 90 tablet 3  ? LORazepam (ATIVAN) 1 MG tablet Take 1 mg by mouth as needed for anxiety.    ? metFORMIN (GLUCOPHAGE) 500 MG tablet Take 1 tablet (500 mg total) by mouth 2 (two) times daily with a meal. 180 tablet 1  ? metoprolol  succinate (TOPROL-XL) 25 MG 24 hr tablet Take 1 tablet (25 mg total) by mouth in the morning, at noon, and at bedtime. 270 tablet 3  ? nitroGLYCERIN (NITROSTAT) 0.4 MG SL tablet Place 1 tablet (0.4 mg total) under the tongue every 5 (five) minutes as needed for chest pain. 25 tablet 6  ? nystatin (MYCOSTATIN/NYSTOP) powder Apply 1 application topically daily as needed (rash/yeast).    ? omeprazole (PRILOSEC) 20 MG capsule Take 1 capsule (20 mg total) by mouth daily. 90 capsule 1  ? promethazine (PHENERGAN) 25 MG tablet Take 1 tablet (25 mg total) by mouth every 6 (six) hours as needed for nausea or vomiting. 30 tablet 0  ? rosuvastatin (CRESTOR) 20 MG tablet Take 1 tablet (20 mg total) by mouth daily. 90 tablet 3  ? senna-docusate (SENOKOT-S) 8.6-50 MG tablet Take 1 tablet by mouth daily.    ? valsartan (DIOVAN) 80 MG tablet Take 1 tablet (80 mg total) by mouth 2 (two) times daily. 180 tablet 3  ? vitamin B-12 (CYANOCOBALAMIN) 1000 MCG tablet Take 1,000 mcg by mouth daily.    ? vitamin C (ASCORBIC ACID) 500 MG tablet Take 500 mg by mouth daily.    ? Vitamin D, Ergocalciferol, (DRISDOL) 1.25 MG (50000 UNIT) CAPS capsule Take 1 capsule (50,000 Units total) by mouth every Saturday. 12 capsule 1  ? ?No current facility-administered medications on file prior to visit.  ? ? ?Allergies  ?Allergen Reactions  ? Vancomycin Anaphylaxis  ? Chlorhexidine Gluconate Itching  ?  Received CHG bath, began itching, required benadryl  ? Cat Hair Extract Other (See Comments)  ?  Sneezing, watery eyes.  ? Tramadol Other (See Comments)  ?  Hallucinations  ? Codeine Rash  ? ? ?Objective: ?There were no vitals filed for this visit. ? ?General: No acute distress, AAOx3  ?Left foot/TMA amputation stump: Sutures and staples intact  some very small gapping to lateral and medial aspect of incision, no warmth no malodor no significant erythema no other clinical signs of infection noted, DP and PT pedal pulses palpable, varicosities to the lower  leg on the left, no pain with palpation to calf and no limitation or pain with range of motion to ankle, status post left foot transmetatarsal amputation with revision. ? ?Assessment and Plan:  ?Problem List Items Addressed This Visit   ?None ? ?  ? ?-Patient seen and evaluated ?-Dressing change performed ?-One suture removed and steri strips applied to media land lateral aspects.  ?-Dressed with dry sterile dressing to surgical site left foot secured with ACE wrap and stockinette.  Continue dressing changes.  ?-Advised patient to continue with nonweightbearing with use of wheelchair only slight pressure to the heel for transfers only like previous  ?-Advised patient to continue with Tylenol for pain like before and advised patient prior to next visit to take this medication to help with [place] continuing her suture and staple removal ?-Continue with rest and elevation for pain and  edema control ?-Will follow-up with Dr. Cannon Kettle. Has a schedule appointment already in a couple days.  ? ?Lorenda Peck , DPM ? ?

## 2022-03-27 ENCOUNTER — Encounter: Payer: Self-pay | Admitting: Physician Assistant

## 2022-03-28 ENCOUNTER — Encounter: Payer: Self-pay | Admitting: Sports Medicine

## 2022-03-28 ENCOUNTER — Ambulatory Visit (INDEPENDENT_AMBULATORY_CARE_PROVIDER_SITE_OTHER): Payer: Managed Care, Other (non HMO) | Admitting: Sports Medicine

## 2022-03-28 DIAGNOSIS — M86672 Other chronic osteomyelitis, left ankle and foot: Secondary | ICD-10-CM

## 2022-03-28 DIAGNOSIS — E1165 Type 2 diabetes mellitus with hyperglycemia: Secondary | ICD-10-CM

## 2022-03-28 DIAGNOSIS — Z89432 Acquired absence of left foot: Secondary | ICD-10-CM

## 2022-03-28 DIAGNOSIS — M79672 Pain in left foot: Secondary | ICD-10-CM

## 2022-03-28 NOTE — Progress Notes (Signed)
Subjective: ?Jasmine Richardson is a 61 y.o. female patient seen today in office for POV # 8 date of surgery 02/01/2022 left foot irrigation and debridement with bone biopsy and primary closure at TMA stump site essentially a revision at [facility].  Relates she is doing okay and states that she saw Dr. Blenda Mounts on Monday.  Patient denies any other pedal complaints or constitutional symptoms at this time states that she did take her Tylenol before coming. ? ?Reports blood sugars have been under 200 which is a big improvement for patient.  Reports that she have not had issues with a recurrent UTI and has been eating yogurt to help. ? ? ?Patient Active Problem List  ? Diagnosis Date Noted  ? AICD (automatic cardioverter/defibrillator) present 03/02/2022  ? Myocardial infarction ([place]) 03/02/2022  ? Cancer ([place]) 02/28/2022  ? Bacteremia 11/22/2021  ? Diabetic foot infection ([place]) 11/21/2021  ? Abnormal stress test 10/23/2021  ? Abnormal EKG 10/23/2021  ? AKI (acute kidney injury) ([place]) 10/23/2021  ? Anxiety 10/23/2021  ? Cellulitis of right breast 10/23/2021  ? Gangrene ([place]) 10/23/2021  ? Hematoma 10/23/2021  ? Hiatal hernia 10/23/2021  ? Hyperglycemia 10/23/2021  ? Hyperglycemia due to type 2 diabetes mellitus ([place]) 10/23/2021  ? Hyponatremia 10/23/2021  ? Infection of great toe 10/23/2021  ? Lactic acidosis 10/23/2021  ? Leukocytosis 10/23/2021  ? Obesity (BMI 30-39.9) 10/23/2021  ? Perineal abscess 10/23/2021  ? Cellulitis, wound, post-operative 10/09/2021  ? Abnormal mammogram of left breast 09/04/2021  ? Breast wound, right, subsequent encounter 09/04/2021  ? Demand ischemia ([place])   ? ICD (implantable cardioverter-defibrillator) in place 05/16/2021  ? Ventricular tachycardia 12/17/2020  ? Unstable angina ([place]) 05/22/2020  ? NSTEMI (non-ST elevated myocardial infarction) ([place]) 05/22/2020  ? Diabetic foot ulcer ([place]) 02/16/2020  ? Angina pectoris ([place]) 09/11/2017  ? Emphysema lung ([place]) 09/11/2017  ? Chronic diastolic heart  failure ([place]) 11/06/2016  ? Mediastinitis 06/28/2016  ? Wound, surgical, infected 06/06/2016  ? S/P CABG (coronary artery bypass graft) 06/03/2016  ? Severe sepsis ([place]) 06/03/2016  ? GERD (gastroesophageal reflux disease) 05/08/2016  ? Morbid obesity ([place]) 05/08/2016  ? Type 2 diabetes mellitus with foot ulcer ([place]) 05/08/2016  ? Sinus tachycardia 05/08/2016  ? Acute chest pain 05/08/2016  ? Burping 05/08/2016  ? Peripheral vascular disease ([place]) 05/08/2016  ? Tobacco use disorder 04/20/2016  ? Coronary artery disease involving native coronary artery of native heart with angina pectoris ([place]) 09/12/2015  ? Essential hypertension 09/12/2015  ? Hyperlipidemia 09/12/2015  ? ? ?Current Outpatient Medications on File Prior to Visit  ?Medication Sig Dispense Refill  ? acetaminophen (TYLENOL) 325 MG tablet Take 2 tablets (650 mg total) by mouth every 4 (four) hours as needed for headache or mild pain.    ? amiodarone (PACERONE) 200 MG tablet Take 1 tablet (200 mg total) by mouth daily. 90 tablet 3  ? aspirin 81 MG chewable tablet Chew 1 tablet by mouth daily.    ? clopidogrel (PLAVIX) 75 MG tablet Take 1 tablet (75 mg total) by mouth daily. 90 tablet 3  ? dapagliflozin propanediol (FARXIGA) 10 MG TABS tablet Take 10 mg by mouth daily.    ? docusate sodium (COLACE) 100 MG capsule Take 1 capsule (100 mg total) by mouth 2 (two) times daily. 10 capsule 0  ? Evolocumab (REPATHA SURECLICK) 683 MG/ML SOAJ Inject 140 mg into the skin every 14 (fourteen) days.    ? fenofibrate (TRICOR) 145 MG tablet Take 1 tablet by mouth once  daily 390 tablet 2  ? furosemide (LASIX) 20 MG tablet Take 20 mg by mouth daily.    ? HUMALOG KWIKPEN 100 UNIT/ML KwikPen Inject 10-50 Units into the skin at bedtime. Sliding scale 15 mL 12  ? insulin degludec (TRESIBA FLEXTOUCH) 100 UNIT/ML FlexTouch Pen Inject 34 Units into the skin daily.    ? isosorbide mononitrate (IMDUR) 30 MG 24 hr tablet Take 1 tablet (30 mg total) by mouth daily. 90 tablet 3  ?  LORazepam (ATIVAN) 1 MG tablet Take 1 mg by mouth as needed for anxiety.    ? metFORMIN (GLUCOPHAGE) 500 MG tablet Take 1 tablet (500 mg total) by mouth 2 (two) times daily with a meal. 180 tablet 1  ? metoprolol succinate (TOPROL-XL) 25 MG 24 hr tablet Take 1 tablet (25 mg total) by mouth in the morning, at noon, and at bedtime. 270 tablet 3  ? nitroGLYCERIN (NITROSTAT) 0.4 MG SL tablet Place 1 tablet (0.4 mg total) under the tongue every 5 (five) minutes as needed for chest pain. 25 tablet 6  ? nystatin (MYCOSTATIN/NYSTOP) powder Apply 1 application topically daily as needed (rash/yeast).    ? omeprazole (PRILOSEC) 20 MG capsule Take 1 capsule (20 mg total) by mouth daily. 90 capsule 1  ? promethazine (PHENERGAN) 25 MG tablet Take 1 tablet (25 mg total) by mouth every 6 (six) hours as needed for nausea or vomiting. 30 tablet 0  ? rosuvastatin (CRESTOR) 20 MG tablet Take 1 tablet (20 mg total) by mouth daily. 90 tablet 3  ? senna-docusate (SENOKOT-S) 8.6-50 MG tablet Take 1 tablet by mouth daily.    ? valsartan (DIOVAN) 80 MG tablet Take 1 tablet (80 mg total) by mouth 2 (two) times daily. 180 tablet 3  ? vitamin B-12 (CYANOCOBALAMIN) 1000 MCG tablet Take 1,000 mcg by mouth daily.    ? vitamin C (ASCORBIC ACID) 500 MG tablet Take 500 mg by mouth daily.    ? Vitamin D, Ergocalciferol, (DRISDOL) 1.25 MG (50000 UNIT) CAPS capsule Take 1 capsule (50,000 Units total) by mouth every Saturday. 12 capsule 1  ? ?No current facility-administered medications on file prior to visit.  ? ? ?Allergies  ?Allergen Reactions  ? Vancomycin Anaphylaxis  ? Chlorhexidine Gluconate Itching  ?  Received CHG bath, began itching, required benadryl  ? Cat Hair Extract Other (See Comments)  ?  Sneezing, watery eyes.  ? Tramadol Other (See Comments)  ?  Hallucinations  ? Codeine Rash  ? ? ?Objective: ?There were no vitals filed for this visit. ? ?General: No acute distress, AAOx3  ?Left foot/TMA amputation stump: Sutures and staples intact   some very small gapping to lateral and medial aspect of incision that appears to be holding stable not worsening since last encounter, no warmth no malodor no significant erythema no other clinical signs of infection noted, DP and PT pedal pulses palpable, varicosities to the lower leg on the left, no pain with palpation to calf and no limitation or pain with range of motion to ankle, status post left foot transmetatarsal amputation with revision. ? ?Assessment and Plan:  ?Problem List Items Addressed This Visit   ?None ?Visit Diagnoses   ? ? S/P transmetatarsal amputation of foot, left ([place])    -  Primary  ? Uncontrolled type 2 diabetes mellitus with hyperglycemia ([place])      ? Other chronic osteomyelitis of left foot ([place])      ? Left foot pain      ? ?  ? ?  ? ?-  Patient seen and evaluated ?-Dressing change performed ?- A few sutures and staples were removed and steri strips applied to medial and lateral aspects of the TMA stump site covered with Betadine and dry dressing.  ?-Advised husband to continue with dressing changes of dry dressing to the area daily ?-Advised patient to continue with nonweightbearing with use of wheelchair only slight pressure to the heel for transfers only like before ?-Advised patient to continue with Tylenol for pain ?-Continue with rest and elevation for pain and edema control ?-Return to office as scheduled on next week or sooner if problems or issues arise. ? ?Landis Martins, DPM ? ? ?

## 2022-04-02 ENCOUNTER — Ambulatory Visit (INDEPENDENT_AMBULATORY_CARE_PROVIDER_SITE_OTHER): Payer: Managed Care, Other (non HMO) | Admitting: Podiatry

## 2022-04-02 ENCOUNTER — Encounter: Payer: Self-pay | Admitting: Podiatry

## 2022-04-02 DIAGNOSIS — Z89432 Acquired absence of left foot: Secondary | ICD-10-CM

## 2022-04-02 DIAGNOSIS — E1165 Type 2 diabetes mellitus with hyperglycemia: Secondary | ICD-10-CM

## 2022-04-02 NOTE — Progress Notes (Signed)
Subjective: ?Jasmine Richardson is a 61 y.o. female patient seen today in office for POV #9  date of surgery 02/01/2022 left foot irrigation and debridement with bone biopsy and primary closure at TMA stump site essentially a revision.  Relates she is doing well. Denies any n/v/f/c.  ? ? ? ?Patient Active Problem List  ? Diagnosis Date Noted  ? AICD (automatic cardioverter/defibrillator) present 03/02/2022  ? Myocardial infarction ([place]) 03/02/2022  ? Cancer ([place]) 02/28/2022  ? Bacteremia 11/22/2021  ? Diabetic foot infection ([place]) 11/21/2021  ? Abnormal stress test 10/23/2021  ? Abnormal EKG 10/23/2021  ? AKI (acute kidney injury) ([place]) 10/23/2021  ? Anxiety 10/23/2021  ? Cellulitis of right breast 10/23/2021  ? Gangrene ([place]) 10/23/2021  ? Hematoma 10/23/2021  ? Hiatal hernia 10/23/2021  ? Hyperglycemia 10/23/2021  ? Hyperglycemia due to type 2 diabetes mellitus ([place]) 10/23/2021  ? Hyponatremia 10/23/2021  ? Infection of great toe 10/23/2021  ? Lactic acidosis 10/23/2021  ? Leukocytosis 10/23/2021  ? Obesity (BMI 30-39.9) 10/23/2021  ? Perineal abscess 10/23/2021  ? Cellulitis, wound, post-operative 10/09/2021  ? Abnormal mammogram of left breast 09/04/2021  ? Breast wound, right, subsequent encounter 09/04/2021  ? Demand ischemia ([place])   ? ICD (implantable cardioverter-defibrillator) in place 05/16/2021  ? Ventricular tachycardia 12/17/2020  ? Unstable angina ([place]) 05/22/2020  ? NSTEMI (non-ST elevated myocardial infarction) ([place]) 05/22/2020  ? Diabetic foot ulcer ([place]) 02/16/2020  ? Angina pectoris ([place]) 09/11/2017  ? Emphysema lung ([place]) 09/11/2017  ? Chronic diastolic heart failure ([place]) 11/06/2016  ? Mediastinitis 06/28/2016  ? Wound, surgical, infected 06/06/2016  ? S/P CABG (coronary artery bypass graft) 06/03/2016  ? Severe sepsis ([place]) 06/03/2016  ? GERD (gastroesophageal reflux disease) 05/08/2016  ? Morbid obesity ([place]) 05/08/2016  ? Type 2 diabetes mellitus with foot ulcer ([place]) 05/08/2016  ? Sinus  tachycardia 05/08/2016  ? Acute chest pain 05/08/2016  ? Burping 05/08/2016  ? Peripheral vascular disease ([place]) 05/08/2016  ? Tobacco use disorder 04/20/2016  ? Coronary artery disease involving native coronary artery of native heart with angina pectoris ([place]) 09/12/2015  ? Essential hypertension 09/12/2015  ? Hyperlipidemia 09/12/2015  ? ? ?Current Outpatient Medications on File Prior to Visit  ?Medication Sig Dispense Refill  ? acetaminophen (TYLENOL) 325 MG tablet Take 2 tablets (650 mg total) by mouth every 4 (four) hours as needed for headache or mild pain.    ? amiodarone (PACERONE) 200 MG tablet Take 1 tablet (200 mg total) by mouth daily. 90 tablet 3  ? aspirin 81 MG chewable tablet Chew 1 tablet by mouth daily.    ? clopidogrel (PLAVIX) 75 MG tablet Take 1 tablet (75 mg total) by mouth daily. 90 tablet 3  ? dapagliflozin propanediol (FARXIGA) 10 MG TABS tablet Take 10 mg by mouth daily.    ? docusate sodium (COLACE) 100 MG capsule Take 1 capsule (100 mg total) by mouth 2 (two) times daily. 10 capsule 0  ? Evolocumab (REPATHA SURECLICK) 270 MG/ML SOAJ Inject 140 mg into the skin every 14 (fourteen) days.    ? fenofibrate (TRICOR) 145 MG tablet Take 1 tablet by mouth once daily 390 tablet 2  ? furosemide (LASIX) 20 MG tablet Take 20 mg by mouth daily.    ? HUMALOG KWIKPEN 100 UNIT/ML KwikPen Inject 10-50 Units into the skin at bedtime. Sliding scale 15 mL 12  ? insulin degludec (TRESIBA FLEXTOUCH) 100 UNIT/ML FlexTouch Pen Inject 34 Units into the skin daily.    ? isosorbide mononitrate (IMDUR)  30 MG 24 hr tablet Take 1 tablet (30 mg total) by mouth daily. 90 tablet 3  ? LORazepam (ATIVAN) 1 MG tablet Take 1 mg by mouth as needed for anxiety.    ? metFORMIN (GLUCOPHAGE) 500 MG tablet Take 1 tablet (500 mg total) by mouth 2 (two) times daily with a meal. 180 tablet 1  ? metoprolol succinate (TOPROL-XL) 25 MG 24 hr tablet Take 1 tablet (25 mg total) by mouth in the morning, at noon, and at bedtime. 270 tablet  3  ? nitroGLYCERIN (NITROSTAT) 0.4 MG SL tablet Place 1 tablet (0.4 mg total) under the tongue every 5 (five) minutes as needed for chest pain. 25 tablet 6  ? nystatin (MYCOSTATIN/NYSTOP) powder Apply 1 application topically daily as needed (rash/yeast).    ? omeprazole (PRILOSEC) 20 MG capsule Take 1 capsule (20 mg total) by mouth daily. 90 capsule 1  ? promethazine (PHENERGAN) 25 MG tablet Take 1 tablet (25 mg total) by mouth every 6 (six) hours as needed for nausea or vomiting. 30 tablet 0  ? rosuvastatin (CRESTOR) 20 MG tablet Take 1 tablet (20 mg total) by mouth daily. 90 tablet 3  ? senna-docusate (SENOKOT-S) 8.6-50 MG tablet Take 1 tablet by mouth daily.    ? valsartan (DIOVAN) 80 MG tablet Take 1 tablet (80 mg total) by mouth 2 (two) times daily. 180 tablet 3  ? vitamin B-12 (CYANOCOBALAMIN) 1000 MCG tablet Take 1,000 mcg by mouth daily.    ? vitamin C (ASCORBIC ACID) 500 MG tablet Take 500 mg by mouth daily.    ? Vitamin D, Ergocalciferol, (DRISDOL) 1.25 MG (50000 UNIT) CAPS capsule Take 1 capsule (50,000 Units total) by mouth every Saturday. 12 capsule 1  ? ?No current facility-administered medications on file prior to visit.  ? ? ?Allergies  ?Allergen Reactions  ? Vancomycin Anaphylaxis  ? Chlorhexidine Gluconate Itching  ?  Received CHG bath, began itching, required benadryl  ? Cat Hair Extract Other (See Comments)  ?  Sneezing, watery eyes.  ? Tramadol Other (See Comments)  ?  Hallucinations  ? Codeine Rash  ? ? ?Objective: ?There were no vitals filed for this visit. ? ?General: No acute distress, AAOx3  ?Left foot/TMA amputation stump: Sutures and staples intact  some very small gapping to lateral and medial aspect of incision but improved,  no warmth no malodor no significant erythema no other clinical signs of infection noted, DP and PT pedal pulses palpable, varicosities to the lower leg on the left, no pain with palpation to calf and no limitation or pain with range of motion to ankle, status post  left foot transmetatarsal amputation with revision. ? ?Assessment and Plan:  ?Problem List Items Addressed This Visit   ?None ?Visit Diagnoses   ? ? S/P transmetatarsal amputation of foot, left ([place])    -  Primary  ? Uncontrolled type 2 diabetes mellitus with hyperglycemia ([place])      ? ?  ? ?  ? ?-Patient seen and evaluated ?-Dressing change performed ?-A few staples removed and steri strips applied to [place] lateral aspects.  ?-Dressed with dry sterile dressing to surgical site left foot secured with ACE wrap and stockinette.  Continue dressing changes.  ?-Advised patient to continue with nonweightbearing with use of wheelchair only slight pressure to the heel for transfers only like previous  ?-Advised patient to continue with Tylenol for pain like before and advised patient prior to next visit to take this medication to help with [place]  continuing her suture and staple removal ?-Continue with rest and elevation for pain and edema control ?-Will follow-up with Dr. Cannon Kettle. In a week.  ? ?Lorenda Peck , DPM ? ?

## 2022-04-03 ENCOUNTER — Encounter (HOSPITAL_COMMUNITY): Payer: Managed Care, Other (non HMO)

## 2022-04-03 ENCOUNTER — Encounter: Payer: Managed Care, Other (non HMO) | Admitting: Sports Medicine

## 2022-04-03 ENCOUNTER — Ambulatory Visit: Payer: Managed Care, Other (non HMO)

## 2022-04-05 NOTE — Progress Notes (Signed)
Remote ICD transmission.   

## 2022-04-11 ENCOUNTER — Ambulatory Visit (INDEPENDENT_AMBULATORY_CARE_PROVIDER_SITE_OTHER): Payer: Managed Care, Other (non HMO) | Admitting: Sports Medicine

## 2022-04-11 ENCOUNTER — Encounter: Payer: Self-pay | Admitting: Sports Medicine

## 2022-04-11 ENCOUNTER — Ambulatory Visit (INDEPENDENT_AMBULATORY_CARE_PROVIDER_SITE_OTHER): Payer: Managed Care, Other (non HMO)

## 2022-04-11 DIAGNOSIS — Z89432 Acquired absence of left foot: Secondary | ICD-10-CM | POA: Diagnosis not present

## 2022-04-11 DIAGNOSIS — M79672 Pain in left foot: Secondary | ICD-10-CM

## 2022-04-11 DIAGNOSIS — E1165 Type 2 diabetes mellitus with hyperglycemia: Secondary | ICD-10-CM

## 2022-04-11 DIAGNOSIS — M86672 Other chronic osteomyelitis, left ankle and foot: Secondary | ICD-10-CM

## 2022-04-11 NOTE — Progress Notes (Signed)
Subjective: ?Jasmine Richardson is a 61 y.o. female patient seen today in office for POV # 10 date of surgery 02/01/2022 left foot irrigation and debridement with bone biopsy and primary closure at TMA stump site essentially a revision at [facility].  Relates she is doing okay and that Dr. Blenda Mounts took out a few staples out last visit. Patient denies any other pedal complaints or constitutional symptoms at this time states that she did take her Tylenol before coming again this visit. ? ?Patient Active Problem List  ? Diagnosis Date Noted  ? AICD (automatic cardioverter/defibrillator) present 03/02/2022  ? Myocardial infarction ([place]) 03/02/2022  ? Cancer ([place]) 02/28/2022  ? Bacteremia 11/22/2021  ? Diabetic foot infection ([place]) 11/21/2021  ? Abnormal stress test 10/23/2021  ? Abnormal EKG 10/23/2021  ? AKI (acute kidney injury) ([place]) 10/23/2021  ? Anxiety 10/23/2021  ? Cellulitis of right breast 10/23/2021  ? Gangrene ([place]) 10/23/2021  ? Hematoma 10/23/2021  ? Hiatal hernia 10/23/2021  ? Hyperglycemia 10/23/2021  ? Hyperglycemia due to type 2 diabetes mellitus ([place]) 10/23/2021  ? Hyponatremia 10/23/2021  ? Infection of great toe 10/23/2021  ? Lactic acidosis 10/23/2021  ? Leukocytosis 10/23/2021  ? Obesity (BMI 30-39.9) 10/23/2021  ? Perineal abscess 10/23/2021  ? Cellulitis, wound, post-operative 10/09/2021  ? Abnormal mammogram of left breast 09/04/2021  ? Breast wound, right, subsequent encounter 09/04/2021  ? Demand ischemia ([place])   ? ICD (implantable cardioverter-defibrillator) in place 05/16/2021  ? Ventricular tachycardia 12/17/2020  ? Unstable angina ([place]) 05/22/2020  ? NSTEMI (non-ST elevated myocardial infarction) ([place]) 05/22/2020  ? Diabetic foot ulcer ([place]) 02/16/2020  ? Angina pectoris ([place]) 09/11/2017  ? Emphysema lung ([place]) 09/11/2017  ? Chronic diastolic heart failure ([place]) 11/06/2016  ? Mediastinitis 06/28/2016  ? Wound, surgical, infected 06/06/2016  ? S/P CABG (coronary artery bypass graft) 06/03/2016  ?  Severe sepsis ([place]) 06/03/2016  ? GERD (gastroesophageal reflux disease) 05/08/2016  ? Morbid obesity ([place]) 05/08/2016  ? Type 2 diabetes mellitus with foot ulcer ([place]) 05/08/2016  ? Sinus tachycardia 05/08/2016  ? Acute chest pain 05/08/2016  ? Burping 05/08/2016  ? Peripheral vascular disease ([place]) 05/08/2016  ? Tobacco use disorder 04/20/2016  ? Coronary artery disease involving native coronary artery of native heart with angina pectoris ([place]) 09/12/2015  ? Essential hypertension 09/12/2015  ? Hyperlipidemia 09/12/2015  ? ? ?Current Outpatient Medications on File Prior to Visit  ?Medication Sig Dispense Refill  ? acetaminophen (TYLENOL) 325 MG tablet Take 2 tablets (650 mg total) by mouth every 4 (four) hours as needed for headache or mild pain.    ? amiodarone (PACERONE) 200 MG tablet Take 1 tablet (200 mg total) by mouth daily. 90 tablet 3  ? aspirin 81 MG chewable tablet Chew 1 tablet by mouth daily.    ? clopidogrel (PLAVIX) 75 MG tablet Take 1 tablet (75 mg total) by mouth daily. 90 tablet 3  ? dapagliflozin propanediol (FARXIGA) 10 MG TABS tablet Take 10 mg by mouth daily.    ? docusate sodium (COLACE) 100 MG capsule Take 1 capsule (100 mg total) by mouth 2 (two) times daily. 10 capsule 0  ? Evolocumab (REPATHA SURECLICK) 951 MG/ML SOAJ Inject 140 mg into the skin every 14 (fourteen) days.    ? fenofibrate (TRICOR) 145 MG tablet Take 1 tablet by mouth once daily 390 tablet 2  ? furosemide (LASIX) 20 MG tablet Take 20 mg by mouth daily.    ? HUMALOG KWIKPEN 100 UNIT/ML KwikPen Inject 10-50 Units into  the skin at bedtime. Sliding scale 15 mL 12  ? insulin degludec (TRESIBA FLEXTOUCH) 100 UNIT/ML FlexTouch Pen Inject 34 Units into the skin daily.    ? isosorbide mononitrate (IMDUR) 30 MG 24 hr tablet Take 1 tablet (30 mg total) by mouth daily. 90 tablet 3  ? LORazepam (ATIVAN) 1 MG tablet Take 1 mg by mouth as needed for anxiety.    ? metFORMIN (GLUCOPHAGE) 500 MG tablet Take 1 tablet (500 mg total) by mouth 2  (two) times daily with a meal. 180 tablet 1  ? metoprolol succinate (TOPROL-XL) 25 MG 24 hr tablet Take 1 tablet (25 mg total) by mouth in the morning, at noon, and at bedtime. 270 tablet 3  ? nitroGLYCERIN (NITROSTAT) 0.4 MG SL tablet Place 1 tablet (0.4 mg total) under the tongue every 5 (five) minutes as needed for chest pain. 25 tablet 6  ? nystatin (MYCOSTATIN/NYSTOP) powder Apply 1 application topically daily as needed (rash/yeast).    ? omeprazole (PRILOSEC) 20 MG capsule Take 1 capsule (20 mg total) by mouth daily. 90 capsule 1  ? promethazine (PHENERGAN) 25 MG tablet Take 1 tablet (25 mg total) by mouth every 6 (six) hours as needed for nausea or vomiting. 30 tablet 0  ? rosuvastatin (CRESTOR) 20 MG tablet Take 1 tablet (20 mg total) by mouth daily. 90 tablet 3  ? senna-docusate (SENOKOT-S) 8.6-50 MG tablet Take 1 tablet by mouth daily.    ? valsartan (DIOVAN) 80 MG tablet Take 1 tablet (80 mg total) by mouth 2 (two) times daily. 180 tablet 3  ? vitamin B-12 (CYANOCOBALAMIN) 1000 MCG tablet Take 1,000 mcg by mouth daily.    ? vitamin C (ASCORBIC ACID) 500 MG tablet Take 500 mg by mouth daily.    ? Vitamin D, Ergocalciferol, (DRISDOL) 1.25 MG (50000 UNIT) CAPS capsule Take 1 capsule (50,000 Units total) by mouth every Saturday. 12 capsule 1  ? ?No current facility-administered medications on file prior to visit.  ? ? ?Allergies  ?Allergen Reactions  ? Vancomycin Anaphylaxis  ? Chlorhexidine Gluconate Itching  ?  Received CHG bath, began itching, required benadryl  ? Cat Hair Extract Other (See Comments)  ?  Sneezing, watery eyes.  ? Tramadol Other (See Comments)  ?  Hallucinations  ? Codeine Rash  ? ? ?Objective: ?There were no vitals filed for this visit. ? ?General: No acute distress, AAOx3  ?Left foot/TMA amputation stump: Sutures and staples intact  some very small gapping to lateral aspect of incision that appears to be holding stable not worsening since last encounter, no warmth no malodor no  significant erythema no other clinical signs of infection noted, DP and PT pedal pulses palpable, varicosities to the lower leg on the left, no pain with palpation to calf and no limitation or pain with range of motion to ankle, status post left foot transmetatarsal amputation with revision. ? ?Assessment and Plan:  ?Problem List Items Addressed This Visit   ?None ?Visit Diagnoses   ? ? S/P transmetatarsal amputation of foot, left ([place])    -  Primary  ? Relevant Orders  ? DG Foot Complete Left (Completed)  ? Uncontrolled type 2 diabetes mellitus with hyperglycemia ([place])      ? Other chronic osteomyelitis of left foot ([place])      ? Left foot pain      ? ?  ? ?  ? ?-Patient seen and evaluated ?-xrays reviewed  ?-Dressing change performed ?- Staples were removed and steri strips  applied to lateral aspects of the TMA stump site covered with Betadine and dry dressing.  ?-Advised husband to continue with dressing changes of dry dressing to the area daily ?-Advised patient to continue with nonweightbearing with use of wheelchair only slight pressure to the heel for transfers only like before ?-Advised patient to continue with Tylenol for pain ?-Continue with rest and elevation for pain and edema control ?-Return to office as scheduled on next week for removal of sutures or sooner if problems or issues arise. ? ?Landis Martins, DPM ? ? ?

## 2022-04-16 ENCOUNTER — Telehealth: Payer: Self-pay

## 2022-04-16 NOTE — Telephone Encounter (Signed)
I left a message on the number(s) listed in the patients chart requesting the patient to call back regarding the [facility] appointment for 05/02/2022. The provider is out of the office that day. The appointment has been canceled. Waiting for the patient to return the call. ? ?(Patient called back and appointment has been RS) ?

## 2022-04-18 ENCOUNTER — Encounter: Payer: Managed Care, Other (non HMO) | Admitting: Sports Medicine

## 2022-04-19 ENCOUNTER — Telehealth: Payer: Self-pay

## 2022-04-19 ENCOUNTER — Ambulatory Visit: Payer: Managed Care, Other (non HMO) | Admitting: Physician Assistant

## 2022-04-19 NOTE — Telephone Encounter (Signed)
Cover my meds -[id number] ?Repatha approved  ?

## 2022-04-20 ENCOUNTER — Encounter: Payer: Self-pay | Admitting: Sports Medicine

## 2022-04-20 ENCOUNTER — Ambulatory Visit (INDEPENDENT_AMBULATORY_CARE_PROVIDER_SITE_OTHER): Payer: Managed Care, Other (non HMO) | Admitting: Sports Medicine

## 2022-04-20 DIAGNOSIS — M79672 Pain in left foot: Secondary | ICD-10-CM

## 2022-04-20 DIAGNOSIS — E1165 Type 2 diabetes mellitus with hyperglycemia: Secondary | ICD-10-CM

## 2022-04-20 DIAGNOSIS — M86672 Other chronic osteomyelitis, left ankle and foot: Secondary | ICD-10-CM

## 2022-04-20 DIAGNOSIS — Z89432 Acquired absence of left foot: Secondary | ICD-10-CM

## 2022-04-20 NOTE — Progress Notes (Signed)
Subjective: ?Jasmine Richardson is a 61 y.o. female patient seen today in office for POV #11 date of surgery 02/01/2022 left foot irrigation and debridement with bone biopsy and primary closure at TMA stump site essentially a revision at [facility].  Reports that she is doing better.  Patient denies any other pedal complaints or constitutional symptoms at this time states that she did take her Tylenol before coming again this visit and still takes it occasionally for pain. ? ?States that she is concerned that she may be getting a another UTI because her blood sugars are elevating. ? ?Patient Active Problem List  ? Diagnosis Date Noted  ? AICD (automatic cardioverter/defibrillator) present 03/02/2022  ? Myocardial infarction ([place]) 03/02/2022  ? Cancer ([place]) 02/28/2022  ? Bacteremia 11/22/2021  ? Diabetic foot infection ([place]) 11/21/2021  ? Abnormal stress test 10/23/2021  ? Abnormal EKG 10/23/2021  ? AKI (acute kidney injury) ([place]) 10/23/2021  ? Anxiety 10/23/2021  ? Cellulitis of right breast 10/23/2021  ? Gangrene ([place]) 10/23/2021  ? Hematoma 10/23/2021  ? Hiatal hernia 10/23/2021  ? Hyperglycemia 10/23/2021  ? Hyperglycemia due to type 2 diabetes mellitus ([place]) 10/23/2021  ? Hyponatremia 10/23/2021  ? Infection of great toe 10/23/2021  ? Lactic acidosis 10/23/2021  ? Leukocytosis 10/23/2021  ? Obesity (BMI 30-39.9) 10/23/2021  ? Perineal abscess 10/23/2021  ? Cellulitis, wound, post-operative 10/09/2021  ? Abnormal mammogram of left breast 09/04/2021  ? Breast wound, right, subsequent encounter 09/04/2021  ? Demand ischemia ([place])   ? ICD (implantable cardioverter-defibrillator) in place 05/16/2021  ? Ventricular tachycardia 12/17/2020  ? Unstable angina ([place]) 05/22/2020  ? NSTEMI (non-ST elevated myocardial infarction) ([place]) 05/22/2020  ? Diabetic foot ulcer ([place]) 02/16/2020  ? Angina pectoris ([place]) 09/11/2017  ? Emphysema lung ([place]) 09/11/2017  ? Chronic diastolic heart failure ([place]) 11/06/2016  ? Mediastinitis  06/28/2016  ? Wound, surgical, infected 06/06/2016  ? S/P CABG (coronary artery bypass graft) 06/03/2016  ? Severe sepsis ([place]) 06/03/2016  ? GERD (gastroesophageal reflux disease) 05/08/2016  ? Morbid obesity ([place]) 05/08/2016  ? Type 2 diabetes mellitus with foot ulcer ([place]) 05/08/2016  ? Sinus tachycardia 05/08/2016  ? Acute chest pain 05/08/2016  ? Burping 05/08/2016  ? Peripheral vascular disease ([place]) 05/08/2016  ? Tobacco use disorder 04/20/2016  ? Coronary artery disease involving native coronary artery of native heart with angina pectoris ([place]) 09/12/2015  ? Essential hypertension 09/12/2015  ? Hyperlipidemia 09/12/2015  ? ? ?Current Outpatient Medications on File Prior to Visit  ?Medication Sig Dispense Refill  ? acetaminophen (TYLENOL) 325 MG tablet Take 2 tablets (650 mg total) by mouth every 4 (four) hours as needed for headache or mild pain.    ? amiodarone (PACERONE) 200 MG tablet Take 1 tablet (200 mg total) by mouth daily. 90 tablet 3  ? aspirin 81 MG chewable tablet Chew 1 tablet by mouth daily.    ? clopidogrel (PLAVIX) 75 MG tablet Take 1 tablet (75 mg total) by mouth daily. 90 tablet 3  ? dapagliflozin propanediol (FARXIGA) 10 MG TABS tablet Take 10 mg by mouth daily.    ? docusate sodium (COLACE) 100 MG capsule Take 1 capsule (100 mg total) by mouth 2 (two) times daily. 10 capsule 0  ? Evolocumab (REPATHA SURECLICK) 263 MG/ML SOAJ Inject 140 mg into the skin every 14 (fourteen) days.    ? fenofibrate (TRICOR) 145 MG tablet Take 1 tablet by mouth once daily 390 tablet 2  ? furosemide (LASIX) 20 MG tablet Take 20 mg  by mouth daily.    ? HUMALOG KWIKPEN 100 UNIT/ML KwikPen Inject 10-50 Units into the skin at bedtime. Sliding scale 15 mL 12  ? insulin degludec (TRESIBA FLEXTOUCH) 100 UNIT/ML FlexTouch Pen Inject 34 Units into the skin daily.    ? isosorbide mononitrate (IMDUR) 30 MG 24 hr tablet Take 1 tablet (30 mg total) by mouth daily. 90 tablet 3  ? LORazepam (ATIVAN) 1 MG tablet Take 1 mg by  mouth as needed for anxiety.    ? metFORMIN (GLUCOPHAGE) 500 MG tablet Take 1 tablet (500 mg total) by mouth 2 (two) times daily with a meal. 180 tablet 1  ? metoprolol succinate (TOPROL-XL) 25 MG 24 hr tablet Take 1 tablet (25 mg total) by mouth in the morning, at noon, and at bedtime. 270 tablet 3  ? nitroGLYCERIN (NITROSTAT) 0.4 MG SL tablet Place 1 tablet (0.4 mg total) under the tongue every 5 (five) minutes as needed for chest pain. 25 tablet 6  ? nystatin (MYCOSTATIN/NYSTOP) powder Apply 1 application topically daily as needed (rash/yeast).    ? omeprazole (PRILOSEC) 20 MG capsule Take 1 capsule (20 mg total) by mouth daily. 90 capsule 1  ? promethazine (PHENERGAN) 25 MG tablet Take 1 tablet (25 mg total) by mouth every 6 (six) hours as needed for nausea or vomiting. 30 tablet 0  ? rosuvastatin (CRESTOR) 20 MG tablet Take 1 tablet (20 mg total) by mouth daily. 90 tablet 3  ? senna-docusate (SENOKOT-S) 8.6-50 MG tablet Take 1 tablet by mouth daily.    ? valsartan (DIOVAN) 80 MG tablet Take 1 tablet (80 mg total) by mouth 2 (two) times daily. 180 tablet 3  ? vitamin B-12 (CYANOCOBALAMIN) 1000 MCG tablet Take 1,000 mcg by mouth daily.    ? vitamin C (ASCORBIC ACID) 500 MG tablet Take 500 mg by mouth daily.    ? Vitamin D, Ergocalciferol, (DRISDOL) 1.25 MG (50000 UNIT) CAPS capsule Take 1 capsule (50,000 Units total) by mouth every Saturday. 12 capsule 1  ? ?No current facility-administered medications on file prior to visit.  ? ? ?Allergies  ?Allergen Reactions  ? Vancomycin Anaphylaxis  ? Chlorhexidine Gluconate Itching  ?  Received CHG bath, began itching, required benadryl  ? Cat Hair Extract Other (See Comments)  ?  Sneezing, watery eyes.  ? Tramadol Other (See Comments)  ?  Hallucinations  ? Codeine Rash  ? ? ?Objective: ?There were no vitals filed for this visit. ? ?General: No acute distress, AAOx3  ?Left foot/TMA amputation stump: Sutures intact  some very small gapping to lateral aspect of incision that  appears to be holding stable not worsening since last encounter, no warmth no malodor no significant erythema no other clinical signs of infection noted, DP and PT pedal pulses palpable, varicosities to the lower leg on the left, no pain with palpation to calf and no limitation or pain with range of motion to ankle, status post left foot transmetatarsal amputation with revision. ? ?Assessment and Plan:  ?Problem List Items Addressed This Visit   ?None ?Visit Diagnoses   ? ? S/P transmetatarsal amputation of foot, left ([place])    -  Primary  ? Uncontrolled type 2 diabetes mellitus with hyperglycemia ([place])      ? Other chronic osteomyelitis of left foot ([place])      ? Left foot pain      ? ?  ? ?  ? ?-Patient seen and evaluated ?-Sutures were removed and steri strips applied to the right  TMA stump site covered with Betadine and dry dressing.  ?-Advised husband to continue with dressing changes of dry dressing to the area daily and close monitoring ?-Advised patient to continue with nonweightbearing with use of wheelchair only slight pressure to the heel for transfers only like previous ?-Advised patient to continue with Tylenol for pain ?-Continue with rest and elevation for pain and edema control ?-Advised patient to follow-up with PCP or urgent care regarding her concerns of a UTI ?-Return to office as scheduled on next week for dressing change and possibly allowing patient to shower.  We anticipated the end of the month patient will be healed that she can be molded for a custom insole with toe filler for the left. ? ?Landis Martins, DPM ? ? ?

## 2022-04-20 NOTE — Telephone Encounter (Signed)
[place] Approved  ?[id number]:[id number]; Status:Approved;Review Type:Prior Auth;Coverage Start Date:03/20/2022;Coverage End Date:04/19/2023; ?

## 2022-04-25 ENCOUNTER — Ambulatory Visit (INDEPENDENT_AMBULATORY_CARE_PROVIDER_SITE_OTHER): Payer: Managed Care, Other (non HMO) | Admitting: Sports Medicine

## 2022-04-25 ENCOUNTER — Encounter: Payer: Self-pay | Admitting: Sports Medicine

## 2022-04-25 DIAGNOSIS — Z89432 Acquired absence of left foot: Secondary | ICD-10-CM

## 2022-04-25 DIAGNOSIS — Z87440 Personal history of urinary (tract) infections: Secondary | ICD-10-CM

## 2022-04-25 DIAGNOSIS — E1165 Type 2 diabetes mellitus with hyperglycemia: Secondary | ICD-10-CM

## 2022-04-25 DIAGNOSIS — M79672 Pain in left foot: Secondary | ICD-10-CM

## 2022-04-25 DIAGNOSIS — M86672 Other chronic osteomyelitis, left ankle and foot: Secondary | ICD-10-CM

## 2022-04-25 NOTE — Progress Notes (Signed)
Subjective: ?Jasmine Richardson is a 61 y.o. female patient seen today in office for POV #12 date of surgery 02/01/2022 left foot irrigation and debridement with bone biopsy and primary closure at TMA stump site essentially a revision at [facility].  Reports that she is doing better and there has not been any drainage skin has been very dry.  Patient denies any other pedal complaints or constitutional symptoms at this time.  Patient admits that she did get checked for a UTI and ended up having 1 and is on cefdinir.  Patient denies any other concerns at this time. ? ?Fasting blood sugar not recorded. ? ?Patient Active Problem List  ? Diagnosis Date Noted  ? AICD (automatic cardioverter/defibrillator) present 03/02/2022  ? Myocardial infarction ([place]) 03/02/2022  ? Cancer ([place]) 02/28/2022  ? Bacteremia 11/22/2021  ? Diabetic foot infection ([place]) 11/21/2021  ? Abnormal stress test 10/23/2021  ? Abnormal EKG 10/23/2021  ? AKI (acute kidney injury) ([place]) 10/23/2021  ? Anxiety 10/23/2021  ? Cellulitis of right breast 10/23/2021  ? Gangrene ([place]) 10/23/2021  ? Hematoma 10/23/2021  ? Hiatal hernia 10/23/2021  ? Hyperglycemia 10/23/2021  ? Hyperglycemia due to type 2 diabetes mellitus ([place]) 10/23/2021  ? Hyponatremia 10/23/2021  ? Infection of great toe 10/23/2021  ? Lactic acidosis 10/23/2021  ? Leukocytosis 10/23/2021  ? Obesity (BMI 30-39.9) 10/23/2021  ? Perineal abscess 10/23/2021  ? Cellulitis, wound, post-operative 10/09/2021  ? Abnormal mammogram of left breast 09/04/2021  ? Breast wound, right, subsequent encounter 09/04/2021  ? Demand ischemia ([place])   ? ICD (implantable cardioverter-defibrillator) in place 05/16/2021  ? Ventricular tachycardia 12/17/2020  ? Unstable angina ([place]) 05/22/2020  ? NSTEMI (non-ST elevated myocardial infarction) ([place]) 05/22/2020  ? Diabetic foot ulcer ([place]) 02/16/2020  ? Angina pectoris ([place]) 09/11/2017  ? Emphysema lung ([place]) 09/11/2017  ? Chronic diastolic heart failure ([place]) 11/06/2016  ?  Mediastinitis 06/28/2016  ? Wound, surgical, infected 06/06/2016  ? S/P CABG (coronary artery bypass graft) 06/03/2016  ? Severe sepsis ([place]) 06/03/2016  ? GERD (gastroesophageal reflux disease) 05/08/2016  ? Morbid obesity ([place]) 05/08/2016  ? Type 2 diabetes mellitus with foot ulcer ([place]) 05/08/2016  ? Sinus tachycardia 05/08/2016  ? Acute chest pain 05/08/2016  ? Burping 05/08/2016  ? Peripheral vascular disease ([place]) 05/08/2016  ? Tobacco use disorder 04/20/2016  ? Coronary artery disease involving native coronary artery of native heart with angina pectoris ([place]) 09/12/2015  ? Essential hypertension 09/12/2015  ? Hyperlipidemia 09/12/2015  ? ? ?Current Outpatient Medications on File Prior to Visit  ?Medication Sig Dispense Refill  ? clopidogrel (PLAVIX) 75 MG tablet Take by mouth.    ? HYDROmorphone (DILAUDID) 4 MG tablet TAKE 1 TABLET BY MOUTH EVERY 6 HOURS AS NEEDED FOR SEVERE PAIN FOR UP TO 5 DAYS    ? sitaGLIPtin (JANUVIA) 100 MG tablet Take by mouth.    ? acetaminophen (TYLENOL) 325 MG tablet Take 2 tablets (650 mg total) by mouth every 4 (four) hours as needed for headache or mild pain.    ? amiodarone (PACERONE) 200 MG tablet Take 1 tablet (200 mg total) by mouth daily. 90 tablet 3  ? ascorbic acid (VITAMIN C) 250 MG tablet Take by mouth.    ? aspirin 81 MG chewable tablet Chew 1 tablet by mouth daily.    ? clopidogrel (PLAVIX) 75 MG tablet Take 1 tablet (75 mg total) by mouth daily. 90 tablet 3  ? dapagliflozin propanediol (FARXIGA) 10 MG TABS tablet Take 10 mg by mouth  daily.    ? docusate sodium (COLACE) 100 MG capsule Take 1 capsule (100 mg total) by mouth 2 (two) times daily. 10 capsule 0  ? Evolocumab (REPATHA SURECLICK) 536 MG/ML SOAJ Inject 140 mg into the skin every 14 (fourteen) days.    ? fenofibrate (TRICOR) 145 MG tablet Take 1 tablet by mouth once daily 390 tablet 2  ? furosemide (LASIX) 20 MG tablet Take 20 mg by mouth daily.    ? glipiZIDE (GLUCOTROL) 10 MG tablet Take by mouth.    ?  HUMALOG KWIKPEN 100 UNIT/ML KwikPen Inject 10-50 Units into the skin at bedtime. Sliding scale 15 mL 12  ? insulin degludec (TRESIBA FLEXTOUCH) 100 UNIT/ML FlexTouch Pen Inject 34 Units into the skin daily.    ? isosorbide mononitrate (IMDUR) 30 MG 24 hr tablet Take 1 tablet (30 mg total) by mouth daily. 90 tablet 3  ? LORazepam (ATIVAN) 1 MG tablet Take 1 mg by mouth as needed for anxiety.    ? metFORMIN (GLUCOPHAGE) 500 MG tablet Take 1 tablet (500 mg total) by mouth 2 (two) times daily with a meal. 180 tablet 1  ? metoprolol succinate (TOPROL-XL) 25 MG 24 hr tablet Take 1 tablet (25 mg total) by mouth in the morning, at noon, and at bedtime. 270 tablet 3  ? nitroGLYCERIN (NITROSTAT) 0.4 MG SL tablet Place 1 tablet (0.4 mg total) under the tongue every 5 (five) minutes as needed for chest pain. 25 tablet 6  ? nystatin (MYCOSTATIN/NYSTOP) powder Apply 1 application topically daily as needed (rash/yeast).    ? omeprazole (PRILOSEC) 20 MG capsule Take 1 capsule (20 mg total) by mouth daily. 90 capsule 1  ? rosuvastatin (CRESTOR) 20 MG tablet Take 1 tablet (20 mg total) by mouth daily. 90 tablet 3  ? senna-docusate (SENOKOT-S) 8.6-50 MG tablet Take 1 tablet by mouth daily.    ? valsartan (DIOVAN) 80 MG tablet Take 1 tablet (80 mg total) by mouth 2 (two) times daily. 180 tablet 3  ? vitamin B-12 (CYANOCOBALAMIN) 1000 MCG tablet Take 1,000 mcg by mouth daily.    ? vitamin C (ASCORBIC ACID) 500 MG tablet Take 500 mg by mouth daily.    ? Vitamin D, Ergocalciferol, (DRISDOL) 1.25 MG (50000 UNIT) CAPS capsule Take 1 capsule (50,000 Units total) by mouth every Saturday. 12 capsule 1  ? ?No current facility-administered medications on file prior to visit.  ? ? ?Allergies  ?Allergen Reactions  ? Vancomycin Anaphylaxis  ? Chlorhexidine Gluconate Itching  ?  Received CHG bath, began itching, required benadryl  ? Cat Hair Extract Other (See Comments)  ?  Sneezing, watery eyes.  ? Tramadol Other (See Comments)  ?  Hallucinations   ? Codeine Rash  ? ? ?Objective: ?There were no vitals filed for this visit. ? ?General: No acute distress, AAOx3  ?Left foot/TMA amputation stump: Incision appears to be healing well no gapping or dehiscence dry scabbing, no warmth no malodor no significant erythema no other clinical signs of infection noted, DP and PT pedal pulses palpable, varicosities to the lower leg on the left, no pain with palpation to calf and no limitation or pain with range of motion to ankle, status post left foot transmetatarsal amputation with revision. ? ?Assessment and Plan:  ?Problem List Items Addressed This Visit   ?None ?Visit Diagnoses   ? ? S/P transmetatarsal amputation of foot, left ([place])    -  Primary  ? Uncontrolled type 2 diabetes mellitus with hyperglycemia ([place])      ?  Relevant Medications  ? glipiZIDE (GLUCOTROL) 10 MG tablet  ? sitaGLIPtin (JANUVIA) 100 MG tablet  ? Other chronic osteomyelitis of left foot ([place])      ? Left foot pain      ? History of UTI      ? ?  ? ?  ? ?-Patient seen and evaluated ?-Steri-Strips reapplied ?-Patient may can discontinue dressings and use a clean white sock over the amputation stump on the left foot ?-Continue with surgical shoe until patient can be fitted with custom molded insole ?-Advised husband to help patient with close monitoring if any of the Steri-Strips come off may reapply if there is any drainage to contact office ?-Advised patient to continue with nonweightbearing when she goes outside of the home with use of wheelchair however in the home may have limited weightbearing with walker and surgical shoe ?-Advised patient to continue with Tylenol for pain ?-Continue with rest and elevation for pain and edema control ?-Continue with cefdinir for UTI ?-Return to office as scheduled on next week for surgery site check and possibly allowing patient to shower. ? ?Landis Martins, DPM ? ? ?

## 2022-05-01 ENCOUNTER — Encounter: Payer: Self-pay | Admitting: Sports Medicine

## 2022-05-01 ENCOUNTER — Ambulatory Visit (INDEPENDENT_AMBULATORY_CARE_PROVIDER_SITE_OTHER): Payer: Managed Care, Other (non HMO) | Admitting: Sports Medicine

## 2022-05-01 DIAGNOSIS — E1165 Type 2 diabetes mellitus with hyperglycemia: Secondary | ICD-10-CM

## 2022-05-01 DIAGNOSIS — Z89432 Acquired absence of left foot: Secondary | ICD-10-CM

## 2022-05-01 DIAGNOSIS — M79672 Pain in left foot: Secondary | ICD-10-CM

## 2022-05-01 DIAGNOSIS — M86672 Other chronic osteomyelitis, left ankle and foot: Secondary | ICD-10-CM

## 2022-05-01 NOTE — Progress Notes (Signed)
Subjective: ?Jasmine Richardson is a 61 y.o. female patient seen today in office for POV #13 date of surgery 02/01/2022 left foot irrigation and debridement with bone biopsy and primary closure at TMA stump site essentially a revision at [facility].  Reports that she is doing good was able to tolerate a stockinet with no drainage. Reports that her skin is dry and flaky.  Patient denies any other concerns at this time. ? ?Fasting blood sugar not recorded. ? ?Patient Active Problem List  ? Diagnosis Date Noted  ? AICD (automatic cardioverter/defibrillator) present 03/02/2022  ? Myocardial infarction ([place]) 03/02/2022  ? Cancer ([place]) 02/28/2022  ? Bacteremia 11/22/2021  ? Diabetic foot infection ([place]) 11/21/2021  ? Abnormal stress test 10/23/2021  ? Abnormal EKG 10/23/2021  ? AKI (acute kidney injury) ([place]) 10/23/2021  ? Anxiety 10/23/2021  ? Cellulitis of right breast 10/23/2021  ? Gangrene ([place]) 10/23/2021  ? Hematoma 10/23/2021  ? Hiatal hernia 10/23/2021  ? Hyperglycemia 10/23/2021  ? Hyperglycemia due to type 2 diabetes mellitus ([place]) 10/23/2021  ? Hyponatremia 10/23/2021  ? Infection of great toe 10/23/2021  ? Lactic acidosis 10/23/2021  ? Leukocytosis 10/23/2021  ? Obesity (BMI 30-39.9) 10/23/2021  ? Perineal abscess 10/23/2021  ? Cellulitis, wound, post-operative 10/09/2021  ? Abnormal mammogram of left breast 09/04/2021  ? Breast wound, right, subsequent encounter 09/04/2021  ? Demand ischemia ([place])   ? ICD (implantable cardioverter-defibrillator) in place 05/16/2021  ? Ventricular tachycardia 12/17/2020  ? Unstable angina ([place]) 05/22/2020  ? NSTEMI (non-ST elevated myocardial infarction) ([place]) 05/22/2020  ? Diabetic foot ulcer ([place]) 02/16/2020  ? Angina pectoris ([place]) 09/11/2017  ? Emphysema lung ([place]) 09/11/2017  ? Chronic diastolic heart failure ([place]) 11/06/2016  ? Mediastinitis 06/28/2016  ? Wound, surgical, infected 06/06/2016  ? S/P CABG (coronary artery bypass graft) 06/03/2016  ? Severe sepsis ([place])  06/03/2016  ? GERD (gastroesophageal reflux disease) 05/08/2016  ? Morbid obesity ([place]) 05/08/2016  ? Type 2 diabetes mellitus with foot ulcer ([place]) 05/08/2016  ? Sinus tachycardia 05/08/2016  ? Acute chest pain 05/08/2016  ? Burping 05/08/2016  ? Peripheral vascular disease ([place]) 05/08/2016  ? Tobacco use disorder 04/20/2016  ? Coronary artery disease involving native coronary artery of native heart with angina pectoris ([place]) 09/12/2015  ? Essential hypertension 09/12/2015  ? Hyperlipidemia 09/12/2015  ? ? ?Current Outpatient Medications on File Prior to Visit  ?Medication Sig Dispense Refill  ? acetaminophen (TYLENOL) 325 MG tablet Take 2 tablets (650 mg total) by mouth every 4 (four) hours as needed for headache or mild pain.    ? amiodarone (PACERONE) 200 MG tablet Take 1 tablet (200 mg total) by mouth daily. 90 tablet 3  ? ascorbic acid (VITAMIN C) 250 MG tablet Take by mouth.    ? aspirin 81 MG chewable tablet Chew 1 tablet by mouth daily.    ? clopidogrel (PLAVIX) 75 MG tablet Take 1 tablet (75 mg total) by mouth daily. 90 tablet 3  ? clopidogrel (PLAVIX) 75 MG tablet Take by mouth.    ? dapagliflozin propanediol (FARXIGA) 10 MG TABS tablet Take 10 mg by mouth daily.    ? docusate sodium (COLACE) 100 MG capsule Take 1 capsule (100 mg total) by mouth 2 (two) times daily. 10 capsule 0  ? Evolocumab (REPATHA SURECLICK) 604 MG/ML SOAJ Inject 140 mg into the skin every 14 (fourteen) days.    ? fenofibrate (TRICOR) 145 MG tablet Take 1 tablet by mouth once daily 390 tablet 2  ? furosemide (LASIX)  20 MG tablet Take 20 mg by mouth daily.    ? glipiZIDE (GLUCOTROL) 10 MG tablet Take by mouth.    ? HUMALOG KWIKPEN 100 UNIT/ML KwikPen Inject 10-50 Units into the skin at bedtime. Sliding scale 15 mL 12  ? HYDROmorphone (DILAUDID) 4 MG tablet TAKE 1 TABLET BY MOUTH EVERY 6 HOURS AS NEEDED FOR SEVERE PAIN FOR UP TO 5 DAYS    ? insulin degludec (TRESIBA FLEXTOUCH) 100 UNIT/ML FlexTouch Pen Inject 34 Units into the skin  daily.    ? isosorbide mononitrate (IMDUR) 30 MG 24 hr tablet Take 1 tablet (30 mg total) by mouth daily. 90 tablet 3  ? LORazepam (ATIVAN) 1 MG tablet Take 1 mg by mouth as needed for anxiety.    ? metFORMIN (GLUCOPHAGE) 500 MG tablet Take 1 tablet (500 mg total) by mouth 2 (two) times daily with a meal. 180 tablet 1  ? metoprolol succinate (TOPROL-XL) 25 MG 24 hr tablet Take 1 tablet (25 mg total) by mouth in the morning, at noon, and at bedtime. 270 tablet 3  ? nitroGLYCERIN (NITROSTAT) 0.4 MG SL tablet Place 1 tablet (0.4 mg total) under the tongue every 5 (five) minutes as needed for chest pain. 25 tablet 6  ? nystatin (MYCOSTATIN/NYSTOP) powder Apply 1 application topically daily as needed (rash/yeast).    ? omeprazole (PRILOSEC) 20 MG capsule Take 1 capsule (20 mg total) by mouth daily. 90 capsule 1  ? rosuvastatin (CRESTOR) 20 MG tablet Take 1 tablet (20 mg total) by mouth daily. 90 tablet 3  ? senna-docusate (SENOKOT-S) 8.6-50 MG tablet Take 1 tablet by mouth daily.    ? sitaGLIPtin (JANUVIA) 100 MG tablet Take by mouth.    ? valsartan (DIOVAN) 80 MG tablet Take 1 tablet (80 mg total) by mouth 2 (two) times daily. 180 tablet 3  ? vitamin B-12 (CYANOCOBALAMIN) 1000 MCG tablet Take 1,000 mcg by mouth daily.    ? vitamin C (ASCORBIC ACID) 500 MG tablet Take 500 mg by mouth daily.    ? Vitamin D, Ergocalciferol, (DRISDOL) 1.25 MG (50000 UNIT) CAPS capsule Take 1 capsule (50,000 Units total) by mouth every Saturday. 12 capsule 1  ? ?No current facility-administered medications on file prior to visit.  ? ? ?Allergies  ?Allergen Reactions  ? Vancomycin Anaphylaxis  ? Chlorhexidine Gluconate Itching  ?  Received CHG bath, began itching, required benadryl  ? Cat Hair Extract Other (See Comments)  ?  Sneezing, watery eyes.  ? Tramadol Other (See Comments)  ?  Hallucinations  ? Codeine Rash  ? ? ?Objective: ?There were no vitals filed for this visit. ? ?General: No acute distress, AAOx3  ?Left foot/TMA amputation  stump: Incision appears to be healed, deep grooves to the stump but no underlying openings, dry scabbing, no warmth no malodor, no significant erythema, no clinical signs of infection noted, DP and PT pedal pulses palpable, varicosities to the lower leg on the left, no pain with palpation to calf and no limitation or pain with range of motion to ankle, status post left foot transmetatarsal amputation with revision. ? ?Assessment and Plan:  ?Problem List Items Addressed This Visit   ?None ?Visit Diagnoses   ? ? S/P transmetatarsal amputation of foot, left ([place])    -  Primary  ? Uncontrolled type 2 diabetes mellitus with hyperglycemia ([place])      ? Other chronic osteomyelitis of left foot ([place])      ? Left foot pain      ? ?  ? ? ? ?-  Patient seen and evaluated ?-Steri-Strips reapplied ?-Patient may shower starting tomorrow and redress with clean stockinet as directed ?-Advised patient to wait to shower when husband is at home to prevent slip/fall ?-Continue with surgical shoe until patient can be fitted for diabetic shoes and custom molded insole with [facility] Edelman  ?-Continue with nonweightbearing when she goes outside of the home with use of wheelchair however in the home may have limited weightbearing with walker and surgical shoe like before ?-Advised patient to continue with Tylenol as needed for pain ?-Continue with rest and elevation for pain and edema control ?-Return to office as scheduled on next week for surgery incision site check.  ? ?Dr. Landis Martins ?[facility] ?[phone] office ? ? ? ?

## 2022-05-02 ENCOUNTER — Ambulatory Visit: Payer: Managed Care, Other (non HMO) | Admitting: Physician Assistant

## 2022-05-07 ENCOUNTER — Encounter: Payer: Self-pay | Admitting: Podiatry

## 2022-05-07 ENCOUNTER — Ambulatory Visit (INDEPENDENT_AMBULATORY_CARE_PROVIDER_SITE_OTHER): Payer: Managed Care, Other (non HMO) | Admitting: Podiatry

## 2022-05-07 DIAGNOSIS — E1165 Type 2 diabetes mellitus with hyperglycemia: Secondary | ICD-10-CM

## 2022-05-07 DIAGNOSIS — Z89432 Acquired absence of left foot: Secondary | ICD-10-CM

## 2022-05-07 NOTE — Progress Notes (Signed)
Subjective: Jasmine Richardson is a 61 y.o. female patient seen today in office for POV #14 date of surgery 02/01/2022 left foot irrigation and debridement with bone biopsy and primary closure at TMA stump site essentially a revision at [facility].  Reports that she is doing good . Reports that her skin is dry and flaky.  Patient denies any other concerns at this time.  Fasting blood sugar not recorded.  Patient Active Problem List   Diagnosis Date Noted   AICD (automatic cardioverter/defibrillator) present 03/02/2022   Myocardial infarction ([place]) 03/02/2022   Cancer ([place]) 02/28/2022   Bacteremia 11/22/2021   Diabetic foot infection ([place]) 11/21/2021   Abnormal stress test 10/23/2021   Abnormal EKG 10/23/2021   AKI (acute kidney injury) ([place]) 10/23/2021   Anxiety 10/23/2021   Cellulitis of right breast 10/23/2021   Gangrene ([place]) 10/23/2021   Hematoma 10/23/2021   Hiatal hernia 10/23/2021   Hyperglycemia 10/23/2021   Hyperglycemia due to type 2 diabetes mellitus ([place]) 10/23/2021   Hyponatremia 10/23/2021   Infection of great toe 10/23/2021   Lactic acidosis 10/23/2021   Leukocytosis 10/23/2021   Obesity (BMI 30-39.9) 10/23/2021   Perineal abscess 10/23/2021   Cellulitis, wound, post-operative 10/09/2021   Abnormal mammogram of left breast 09/04/2021   Breast wound, right, subsequent encounter 09/04/2021   Demand ischemia ([place])    ICD (implantable cardioverter-defibrillator) in place 05/16/2021   Ventricular tachycardia 12/17/2020   Unstable angina ([place]) 05/22/2020   NSTEMI (non-ST elevated myocardial infarction) ([place]) 05/22/2020   Diabetic foot ulcer ([place]) 02/16/2020   Angina pectoris ([place]) 09/11/2017   Emphysema lung ([place]) 09/11/2017   Chronic diastolic heart failure ([place]) 11/06/2016   Mediastinitis 06/28/2016   Wound, surgical, infected 06/06/2016   S/P CABG (coronary artery bypass graft) 06/03/2016   Severe sepsis ([place]) 06/03/2016   GERD (gastroesophageal reflux disease)  05/08/2016   Morbid obesity ([place]) 05/08/2016   Type 2 diabetes mellitus with foot ulcer ([place]) 05/08/2016   Sinus tachycardia 05/08/2016   Acute chest pain 05/08/2016   Burping 05/08/2016   Peripheral vascular disease ([place]) 05/08/2016   Tobacco use disorder 04/20/2016   Coronary artery disease involving native coronary artery of native heart with angina pectoris ([place]) 09/12/2015   Essential hypertension 09/12/2015   Hyperlipidemia 09/12/2015    Current Outpatient Medications on File Prior to Visit  Medication Sig Dispense Refill   acetaminophen (TYLENOL) 325 MG tablet Take 2 tablets (650 mg total) by mouth every 4 (four) hours as needed for headache or mild pain.     amiodarone (PACERONE) 200 MG tablet Take 1 tablet (200 mg total) by mouth daily. 90 tablet 3   ascorbic acid (VITAMIN C) 250 MG tablet Take by mouth.     aspirin 81 MG chewable tablet Chew 1 tablet by mouth daily.     clopidogrel (PLAVIX) 75 MG tablet Take 1 tablet (75 mg total) by mouth daily. 90 tablet 3   clopidogrel (PLAVIX) 75 MG tablet Take by mouth.     dapagliflozin propanediol (FARXIGA) 10 MG TABS tablet Take 10 mg by mouth daily.     docusate sodium (COLACE) 100 MG capsule Take 1 capsule (100 mg total) by mouth 2 (two) times daily. 10 capsule 0   Evolocumab (REPATHA SURECLICK) 629 MG/ML SOAJ Inject 140 mg into the skin every 14 (fourteen) days.     fenofibrate (TRICOR) 145 MG tablet Take 1 tablet by mouth once daily 390 tablet 2   furosemide (LASIX) 20 MG tablet Take 20 mg by mouth  daily.     glipiZIDE (GLUCOTROL) 10 MG tablet Take by mouth.     HUMALOG KWIKPEN 100 UNIT/ML KwikPen Inject 10-50 Units into the skin at bedtime. Sliding scale 15 mL 12   HYDROmorphone (DILAUDID) 4 MG tablet TAKE 1 TABLET BY MOUTH EVERY 6 HOURS AS NEEDED FOR SEVERE PAIN FOR UP TO 5 DAYS     insulin degludec (TRESIBA FLEXTOUCH) 100 UNIT/ML FlexTouch Pen Inject 34 Units into the skin daily.     isosorbide mononitrate (IMDUR) 30 MG 24 hr  tablet Take 1 tablet (30 mg total) by mouth daily. 90 tablet 3   LORazepam (ATIVAN) 1 MG tablet Take 1 mg by mouth as needed for anxiety.     metFORMIN (GLUCOPHAGE) 500 MG tablet Take 1 tablet (500 mg total) by mouth 2 (two) times daily with a meal. 180 tablet 1   metoprolol succinate (TOPROL-XL) 25 MG 24 hr tablet Take 1 tablet (25 mg total) by mouth in the morning, at noon, and at bedtime. 270 tablet 3   nitroGLYCERIN (NITROSTAT) 0.4 MG SL tablet Place 1 tablet (0.4 mg total) under the tongue every 5 (five) minutes as needed for chest pain. 25 tablet 6   nystatin (MYCOSTATIN/NYSTOP) powder Apply 1 application topically daily as needed (rash/yeast).     omeprazole (PRILOSEC) 20 MG capsule Take 1 capsule (20 mg total) by mouth daily. 90 capsule 1   rosuvastatin (CRESTOR) 20 MG tablet Take 1 tablet (20 mg total) by mouth daily. 90 tablet 3   senna-docusate (SENOKOT-S) 8.6-50 MG tablet Take 1 tablet by mouth daily.     sitaGLIPtin (JANUVIA) 100 MG tablet Take by mouth.     valsartan (DIOVAN) 80 MG tablet Take 1 tablet (80 mg total) by mouth 2 (two) times daily. 180 tablet 3   vitamin B-12 (CYANOCOBALAMIN) 1000 MCG tablet Take 1,000 mcg by mouth daily.     vitamin C (ASCORBIC ACID) 500 MG tablet Take 500 mg by mouth daily.     Vitamin D, Ergocalciferol, (DRISDOL) 1.25 MG (50000 UNIT) CAPS capsule Take 1 capsule (50,000 Units total) by mouth every Saturday. 12 capsule 1   No current facility-administered medications on file prior to visit.    Allergies  Allergen Reactions   Vancomycin Anaphylaxis   Chlorhexidine Gluconate Itching    Received CHG bath, began itching, required benadryl   Cat Hair Extract Other (See Comments)    Sneezing, watery eyes.   Tramadol Other (See Comments)    Hallucinations   Codeine Rash    Objective: There were no vitals filed for this visit.  General: No acute distress, AAOx3  Left foot/TMA amputation stump: Incision appears to be healed, deep grooves to the  stump but no underlying openings, dry scabbing, no warmth no malodor, no significant erythema, no clinical signs of infection noted, DP and PT pedal pulses palpable, varicosities to the lower leg on the left, no pain with palpation to calf and no limitation or pain with range of motion to ankle, status post left foot transmetatarsal amputation with revision.  Assessment and Plan:  Problem List Items Addressed This Visit   None     -Patient seen and evaluated -Continue with surgical shoe until patient can be fitted for diabetic shoes and custom molded insole with [place] with nonweightbearing when she goes outside of the home with use of wheelchair however in the home may have limited weightbearing with walker and surgical shoe like before -Advised patient to continue with Tylenol as needed  for pain -Continue with rest and elevation for pain and edema control -Return to office in one week with Dr. Cannon Kettle as scheduled.   Dr.  Lorenda Peck

## 2022-05-08 ENCOUNTER — Encounter: Payer: Self-pay | Admitting: Vascular Surgery

## 2022-05-08 ENCOUNTER — Ambulatory Visit (INDEPENDENT_AMBULATORY_CARE_PROVIDER_SITE_OTHER)
Admission: RE | Admit: 2022-05-08 | Discharge: 2022-05-08 | Disposition: A | Discharge status: Home / Self Care | Payer: Managed Care, Other (non HMO) | Source: Ambulatory Visit | Attending: Physician Assistant | Admitting: Physician Assistant

## 2022-05-08 ENCOUNTER — Ambulatory Visit: Payer: Managed Care, Other (non HMO) | Admitting: Vascular Surgery

## 2022-05-08 ENCOUNTER — Ambulatory Visit (HOSPITAL_COMMUNITY)
Admission: RE | Admit: 2022-05-08 | Discharge: 2022-05-08 | Disposition: A | Discharge status: Home / Self Care | Payer: Managed Care, Other (non HMO) | Source: Ambulatory Visit | Attending: Physician Assistant | Admitting: Physician Assistant

## 2022-05-08 VITALS — BP 156/82 | HR 71 | Temp 97.6°F | Resp 14 | Ht 67.0 in | Wt 223.0 lb

## 2022-05-08 DIAGNOSIS — I739 Peripheral vascular disease, unspecified: Secondary | ICD-10-CM | POA: Diagnosis present

## 2022-05-08 NOTE — Progress Notes (Signed)
Patient name: Haruye Lainez MRN: [id number] DOB: 04-17-1961 Sex: female  REASON FOR CONSULT: Follow-up for surveillance of PAD  HPI: Kaylenn Civil is a 61 y.o. female, with multiple medical problems including DM, HTN, HLD, CAD that presents for interval surveillance after previous left leg intervention for CLI with tissue loss.  She underwent a left AT angioplasty on 10/01/2021 for multiple high-grade stenoses greater than 80% in the setting of tissue loss.  She is followed by podiatry and has now had a TMA that has healed.  She walks with a walker.  No other complaints with her foot.  Past Medical History:  Diagnosis Date   Abnormal EKG 10/23/2021   Abnormal mammogram of left breast 09/04/2021   Abnormal stress test 10/23/2021   Acute chest pain 05/08/2016   AICD (automatic cardioverter/defibrillator) present    St Jude/Abbott device   AKI (acute kidney injury) ([place]) 10/23/2021   Angina pectoris ([place]) 09/11/2017   Anxiety 10/23/2021   Bacteremia 11/22/2021   Breast wound, right, subsequent encounter 09/04/2021   Burping 05/08/2016   Cancer ([place])    cervical - hysterectomy   Cellulitis of right breast 10/23/2021   Cellulitis, wound, post-operative 10/09/2021   Chronic diastolic heart failure ([place]) 11/06/2016   Coronary artery disease involving native coronary artery of native heart with angina pectoris ([place]) 09/12/2015   Overview:  PCI and stent of RCA 2009, last cath 2012 with medical therapy  CABG May 2017   Demand ischemia [facility])    Diabetic foot infection ([place]) 11/21/2021   Diabetic foot ulcer ([place]) 02/16/2020   Emphysema lung ([place]) 09/11/2017   patient not aware of this dx   Essential hypertension 09/12/2015   Gangrene ([place]) 10/23/2021   GERD (gastroesophageal reflux disease) 05/08/2016   Hematoma 10/23/2021   Hiatal hernia 10/23/2021   Hyperglycemia 10/23/2021   Hyperglycemia due to type 2 diabetes mellitus ([place]) 10/23/2021   Hyperlipidemia 09/12/2015   Hyponatremia 10/23/2021    ICD (implantable cardioverter-defibrillator) in place 05/16/2021   Infection of great toe 10/23/2021   Lactic acidosis 10/23/2021   Leukocytosis 10/23/2021   Mediastinitis 06/28/2016   Morbid obesity ([place]) 05/08/2016   Myocardial infarction [facility])    NSTEMI (non-ST elevated myocardial infarction) ([place]) 05/22/2020   Obesity (BMI 30-39.9) 10/23/2021   Perineal abscess 10/23/2021   Peripheral vascular disease ([place])    S/P CABG (coronary artery bypass graft) 06/03/2016   Overview:  The patient underwent sternal reconstruction on 06/21/16 with pec flaps for mediastinitis from a prior CABG in May 2017. On admission, she was critically ill from sepsis and had altered mental status. She was last seen in clinic on 08/09/16 at which time she was doing well.   Severe sepsis ([place]) 06/03/2016   Sinus tachycardia 05/08/2016   Tobacco use disorder 04/20/2016   Overview:  Quit in May 2017.   Type 2 diabetes mellitus with foot ulcer ([place]) 05/08/2016   Unstable angina ([place]) 05/22/2020   Ventricular tachycardia ([place]) 12/17/2020   Wound, surgical, infected 06/06/2016   Overview:  sternal    Past Surgical History:  Procedure Laterality Date   ABDOMINAL AORTOGRAM W/LOWER EXTREMITY N/A 10/11/2021   Procedure: ABDOMINAL AORTOGRAM W/LOWER EXTREMITY;  Surgeon: Marty Heck, MD;  Location: [place];  Service: Cardiovascular;  Laterality: N/A;   AMPUTATION TOE Right 02/20/2020   Procedure: AMPUTATION RIGHT GREAT  TOE;  Surgeon: Felipa Furnace, DPM;  Location: [facility];  Service: Podiatry;  Laterality: Right;   BLADDER SURGERY  BONE BIOPSY Left 02/01/2022   Procedure: BONE BIOPSY LEFT FOOT, IRRIGATION AND DEBRIDEMENT;  Surgeon: Landis Martins, DPM;  Location: [place];  Service: Podiatry;  Laterality: Left;   BUBBLE STUDY  11/27/2021   Procedure: BUBBLE STUDY;  Surgeon: Fay Records, MD;  Location: [place];  Service: Cardiovascular;;   CARDIAC CATHETERIZATION     CORONARY ARTERY BYPASS GRAFT     CORONARY  STENT INTERVENTION N/A 05/25/2020   Procedure: CORONARY STENT INTERVENTION;  Surgeon: Wellington Hampshire, MD;  Location: [facility] [facility];  Service: Cardiovascular;  Laterality: N/A;   CORONARY STENT INTERVENTION N/A 06/22/2020   Procedure: CORONARY STENT INTERVENTION;  Surgeon: Leonie Man, MD;  Location: [place] [facility];  Service: Cardiovascular;  Laterality: N/A;   ICD IMPLANT N/A 12/19/2020   Procedure: ICD IMPLANT;  Surgeon: Evans Lance, MD;  Location: [facility];  Service: Cardiovascular;  Laterality: N/A;   INCISION AND DRAINAGE Right 02/19/2020   Procedure: INCISION AND DRAINAGE;  Surgeon: Trula Slade, DPM;  Location: [facility];  Service: Podiatry;  Laterality: Right;  Block done by surgeon   INTRAVASCULAR ULTRASOUND/IVUS N/A 06/22/2020   Procedure: Intravascular Ultrasound/IVUS;  Surgeon: Leonie Man, MD;  Location: [place] [facility];  Service: Cardiovascular;  Laterality: N/A;   IRRIGATION AND DEBRIDEMENT FOOT Left 10/10/2021   Procedure: IRRIGATION AND DEBRIDEMENT FOOT;  Surgeon: Landis Martins, DPM;  Location: [place];  Service: Podiatry;  Laterality: Left;   IRRIGATION AND DEBRIDEMENT FOOT Left 11/23/2021   Procedure: IRRIGATION AND DEBRIDEMENT FOOT;  Surgeon: Lorenda Peck, MD;  Location: [facility];  Service: Podiatry;  Laterality: Left;  will do local block   LEFT HEART CATH AND CORS/GRAFTS ANGIOGRAPHY N/A 05/23/2020   Procedure: LEFT HEART CATH AND CORS/GRAFTS ANGIOGRAPHY;  Surgeon: Nelva Bush, MD;  Location: [place] [facility];  Service: Cardiovascular;  Laterality: N/A;   LEFT HEART CATH AND CORS/GRAFTS ANGIOGRAPHY N/A 12/19/2020   Procedure: LEFT HEART CATH AND CORS/GRAFTS ANGIOGRAPHY;  Surgeon: Burnell Blanks, MD;  Location: [place] [facility];  Service: Cardiovascular;  Laterality: N/A;   LEFT HEART CATH AND CORS/GRAFTS ANGIOGRAPHY N/A 07/21/2021   Procedure: LEFT HEART CATH AND CORS/GRAFTS ANGIOGRAPHY;  Surgeon: Nelva Bush, MD;  Location: [place] [facility];  Service: Cardiovascular;  Laterality: N/A;   METATARSAL HEAD EXCISION Right 05/02/2020   Procedure: FIRST METATARSAL HEAD RESECTION; RIGHT FOOT WOUND CLOSURE;  Surgeon: Landis Martins, DPM;  Location: ;  Service: Podiatry;  Laterality: Right;  MAC W/LOCAL   PERIPHERAL VASCULAR BALLOON ANGIOPLASTY Left 10/11/2021   Procedure: PERIPHERAL VASCULAR BALLOON ANGIOPLASTY;  Surgeon: Marty Heck, MD;  Location: [place] [facility];  Service: Cardiovascular;  Laterality: Left;  Anterior Tibial Artery   TEE WITHOUT CARDIOVERSION N/A 11/27/2021   Procedure: TRANSESOPHAGEAL ECHOCARDIOGRAM (TEE);  Surgeon: Fay Records, MD;  Location: [facility];  Service: Cardiovascular;  Laterality: N/A;   TRANSMETATARSAL AMPUTATION Left 11/23/2021   Procedure: TRANSMETATARSAL AMPUTATION;  Surgeon: Lorenda Peck, MD;  Location: [place];  Service: Podiatry;  Laterality: Left;   TUBAL LIGATION      Family History  Problem Relation Age of Onset   Hypertension Mother    Hyperlipidemia Mother    Heart attack Father    Heart disease Father    Hypertension Father    Alzheimer's disease Father    Hypertension Brother    Hyperlipidemia Brother     SOCIAL HISTORY: Social History   Socioeconomic History  Marital status: Married    Spouse name: Not on file   Number of children: Not on file   Years of education: Not on file   Highest education level: Not on file  Occupational History   Not on file  Tobacco Use   Smoking status: Former    Types: Cigarettes    Quit date: 05/2016    Years since quitting: 5.9   Smokeless tobacco: Never  Vaping Use   Vaping Use: Never used  Substance and Sexual Activity   Alcohol use: Yes    Comment: social wine   Drug use: No   Sexual activity: Yes    Birth control/protection: Surgical    Comment: Hysterectomy  Other Topics Concern   Not on file  Social History Narrative   Not on file   Social Determinants of Health    Financial Resource Strain: Not on file  Food Insecurity: Not on file  Transportation Needs: Not on file  Physical Activity: Not on file  Stress: Not on file  Social Connections: Not on file  Intimate Partner Violence: Not on file    Allergies  Allergen Reactions   Vancomycin Anaphylaxis   Chlorhexidine Gluconate Itching    Received CHG bath, began itching, required benadryl   Cat Hair Extract Other (See Comments)    Sneezing, watery eyes.   Tramadol Other (See Comments)    Hallucinations   Codeine Rash    Current Outpatient Medications  Medication Sig Dispense Refill   acetaminophen (TYLENOL) 325 MG tablet Take 2 tablets (650 mg total) by mouth every 4 (four) hours as needed for headache or mild pain.     amiodarone (PACERONE) 200 MG tablet Take 1 tablet (200 mg total) by mouth daily. 90 tablet 3   ascorbic acid (VITAMIN C) 250 MG tablet Take by mouth.     aspirin 81 MG chewable tablet Chew 1 tablet by mouth daily.     cefdinir (OMNICEF) 300 MG capsule Take 300 mg by mouth 2 (two) times daily.     clopidogrel (PLAVIX) 75 MG tablet Take 1 tablet (75 mg total) by mouth daily. 90 tablet 3   clopidogrel (PLAVIX) 75 MG tablet Take by mouth.     dapagliflozin propanediol (FARXIGA) 10 MG TABS tablet Take 10 mg by mouth daily.     docusate sodium (COLACE) 100 MG capsule Take 1 capsule (100 mg total) by mouth 2 (two) times daily. 10 capsule 0   Evolocumab (REPATHA SURECLICK) 625 MG/ML SOAJ Inject 140 mg into the skin every 14 (fourteen) days.     fenofibrate (TRICOR) 145 MG tablet Take 1 tablet by mouth once daily 390 tablet 2   furosemide (LASIX) 20 MG tablet Take 20 mg by mouth daily.     glipiZIDE (GLUCOTROL) 10 MG tablet Take by mouth.     HUMALOG KWIKPEN 100 UNIT/ML KwikPen Inject 10-50 Units into the skin at bedtime. Sliding scale 15 mL 12   insulin degludec (TRESIBA FLEXTOUCH) 100 UNIT/ML FlexTouch Pen Inject 34 Units into the skin daily.     isosorbide mononitrate (IMDUR) 30  MG 24 hr tablet Take 1 tablet (30 mg total) by mouth daily. 90 tablet 3   LORazepam (ATIVAN) 1 MG tablet Take 1 mg by mouth as needed for anxiety.     metFORMIN (GLUCOPHAGE) 500 MG tablet Take 1 tablet (500 mg total) by mouth 2 (two) times daily with a meal. 180 tablet 1   metoprolol succinate (TOPROL-XL) 25 MG 24 hr tablet Take 1  tablet (25 mg total) by mouth in the morning, at noon, and at bedtime. 270 tablet 3   nitroGLYCERIN (NITROSTAT) 0.4 MG SL tablet Place 1 tablet (0.4 mg total) under the tongue every 5 (five) minutes as needed for chest pain. 25 tablet 6   nystatin (MYCOSTATIN/NYSTOP) powder Apply 1 application topically daily as needed (rash/yeast).     omeprazole (PRILOSEC) 20 MG capsule Take 1 capsule (20 mg total) by mouth daily. 90 capsule 1   rosuvastatin (CRESTOR) 20 MG tablet Take 1 tablet (20 mg total) by mouth daily. 90 tablet 3   senna-docusate (SENOKOT-S) 8.6-50 MG tablet Take 1 tablet by mouth daily.     sitaGLIPtin (JANUVIA) 100 MG tablet Take by mouth.     valsartan (DIOVAN) 80 MG tablet Take 1 tablet (80 mg total) by mouth 2 (two) times daily. 180 tablet 3   vitamin B-12 (CYANOCOBALAMIN) 1000 MCG tablet Take 1,000 mcg by mouth daily.     vitamin C (ASCORBIC ACID) 500 MG tablet Take 500 mg by mouth daily.     Vitamin D, Ergocalciferol, (DRISDOL) 1.25 MG (50000 UNIT) CAPS capsule Take 1 capsule (50,000 Units total) by mouth every Saturday. 12 capsule 1   HYDROmorphone (DILAUDID) 4 MG tablet TAKE 1 TABLET BY MOUTH EVERY 6 HOURS AS NEEDED FOR SEVERE PAIN FOR UP TO 5 DAYS (Patient not taking: Reported on 05/08/2022)     No current facility-administered medications for this visit.    REVIEW OF SYSTEMS:  _0  denotes positive finding, _1  denotes negative finding Cardiac  Comments:  Chest pain or chest pressure:    Shortness of breath upon exertion:    Short of breath when lying flat:    Irregular heart rhythm:        Vascular    Pain in calf, thigh, or hip brought on by  ambulation:    Pain in feet at night that wakes you up from your sleep:     Blood clot in your veins:    Leg swelling:         Pulmonary    Oxygen at home:    Productive cough:     Wheezing:         Neurologic    Sudden weakness in arms or legs:     Sudden numbness in arms or legs:     Sudden onset of difficulty speaking or slurred speech:    Temporary loss of vision in one eye:     Problems with dizziness:         Gastrointestinal    Blood in stool:     Vomited blood:         Genitourinary    Burning when urinating:     Blood in urine:        Psychiatric    Major depression:         Hematologic    Bleeding problems:    Problems with blood clotting too easily:        Skin    Rashes or ulcers:        Constitutional    Fever or chills:      PHYSICAL EXAM: Vitals:   05/08/22 1628  BP: (!) 156/82  Pulse: 71  Resp: 14  Temp: 97.6 F (36.4 C)  TempSrc: Temporal  SpO2: 97%  Weight: 223 lb (101.2 kg)  Height: _2  (1.702 m)    GENERAL: The patient is a well-nourished female, in no acute distress. The vital signs are  documented above. CARDIAC: There is a regular rate and rhythm.  VASCULAR:  Left DP palpable Left TMA pictured below and appears healed PULMONARY: No respiratory distress. ABDOMEN: Soft and non-tender. MUSCULOSKELETAL: There are no major deformities or cyanosis. NEUROLOGIC: No focal weakness or paresthesias are detected. PSYCHIATRIC: The patient has a normal affect.     DATA:   Left lower extremity arterial duplex shows a moderate stenosis in the proximal AT at site of previous angioplasty  ABI's today are 0.95 right biphasic and 1.18 left biphasic   Assessment/Plan:  61 year old female well-known to vascular surgery who previous underwent a left anterior tibial angioplasty for critical limb ischemia with tissue loss now having undergone ultimately a TMA with podiatry.  The TMA appears completely healed as pictured above.  She has a  palpable DP pulse on exam.  Her ABI's are preserved at the left ankle.  She does have some recurrent stenosis in the AT at site of previous angioplasty but given no active tissue loss and a healed TMA I think we can continue surveillance especially with no other leg symptoms.  I will see her in 6 months with ABIs.  She is aspirin, statin, plavix for risk reduction.     Marty Heck, MD [facility] Office: [phone]

## 2022-05-10 ENCOUNTER — Telehealth: Payer: Self-pay

## 2022-05-10 LAB — HM DIABETES EYE EXAM

## 2022-05-10 NOTE — Telephone Encounter (Signed)
04/17/2022 patient was denied patient assistance medication with [place] patient assistance program. Per fax patient may be eligible for coverage with insurance or Low income program. Will have Felicity Coyer start that process ASAP

## 2022-05-10 NOTE — Telephone Encounter (Signed)
Sure I will send a message to the scheduler to check benefits and maybe we can get a referral placed.  Pattricia Boss, [place] Pharmacist Assistant [phone]

## 2022-05-10 NOTE — Chronic Care Management (AMB) (Signed)
On 04/17/2022 patient was denied patient assistance medication with [place] patient assistance program. Per fax patient may be eligible for coverage with insurance or Low income program.   Pattricia Boss, [place] Pharmacist Assistant [phone]

## 2022-05-11 ENCOUNTER — Other Ambulatory Visit: Payer: Self-pay | Admitting: *Deleted

## 2022-05-11 DIAGNOSIS — I739 Peripheral vascular disease, unspecified: Secondary | ICD-10-CM

## 2022-05-15 ENCOUNTER — Ambulatory Visit (INDEPENDENT_AMBULATORY_CARE_PROVIDER_SITE_OTHER): Payer: Managed Care, Other (non HMO)

## 2022-05-15 ENCOUNTER — Ambulatory Visit (INDEPENDENT_AMBULATORY_CARE_PROVIDER_SITE_OTHER): Payer: Managed Care, Other (non HMO) | Admitting: Sports Medicine

## 2022-05-15 ENCOUNTER — Ambulatory Visit: Payer: Managed Care, Other (non HMO) | Admitting: Physician Assistant

## 2022-05-15 ENCOUNTER — Encounter: Payer: Self-pay | Admitting: Sports Medicine

## 2022-05-15 DIAGNOSIS — M86672 Other chronic osteomyelitis, left ankle and foot: Secondary | ICD-10-CM

## 2022-05-15 DIAGNOSIS — M79672 Pain in left foot: Secondary | ICD-10-CM | POA: Diagnosis not present

## 2022-05-15 DIAGNOSIS — Z87440 Personal history of urinary (tract) infections: Secondary | ICD-10-CM

## 2022-05-15 DIAGNOSIS — E1165 Type 2 diabetes mellitus with hyperglycemia: Secondary | ICD-10-CM

## 2022-05-15 DIAGNOSIS — Z89432 Acquired absence of left foot: Secondary | ICD-10-CM

## 2022-05-15 NOTE — Progress Notes (Signed)
Subjective: Jasmine Richardson is a 61 y.o. female patient seen today in office for POV #15 date of surgery 02/01/2022 left foot irrigation and debridement with bone biopsy and primary closure at TMA stump site essentially a revision at [facility].  Reports that she is doing good was able to tolerate a stockinet but has not been showering only using a wet rag to wash the foot. States that she feels like she is getting another UTI and will be going to Urgent care.  Fasting blood sugar not recorded but per patient elevated.   Patient Active Problem List   Diagnosis Date Noted   AICD (automatic cardioverter/defibrillator) present 03/02/2022   Myocardial infarction ([place]) 03/02/2022   Cancer ([place]) 02/28/2022   Bacteremia 11/22/2021   Diabetic foot infection ([place]) 11/21/2021   Abnormal stress test 10/23/2021   Abnormal EKG 10/23/2021   AKI (acute kidney injury) ([place]) 10/23/2021   Anxiety 10/23/2021   Cellulitis of right breast 10/23/2021   Gangrene ([place]) 10/23/2021   Hematoma 10/23/2021   Hiatal hernia 10/23/2021   Hyperglycemia 10/23/2021   Hyperglycemia due to type 2 diabetes mellitus ([place]) 10/23/2021   Hyponatremia 10/23/2021   Infection of great toe 10/23/2021   Lactic acidosis 10/23/2021   Leukocytosis 10/23/2021   Obesity (BMI 30-39.9) 10/23/2021   Perineal abscess 10/23/2021   Cellulitis, wound, post-operative 10/09/2021   Abnormal mammogram of left breast 09/04/2021   Breast wound, right, subsequent encounter 09/04/2021   Demand ischemia ([place])    ICD (implantable cardioverter-defibrillator) in place 05/16/2021   Ventricular tachycardia 12/17/2020   Unstable angina ([place]) 05/22/2020   NSTEMI (non-ST elevated myocardial infarction) ([place]) 05/22/2020   Diabetic foot ulcer ([place]) 02/16/2020   Angina pectoris ([place]) 09/11/2017   Emphysema lung ([place]) 09/11/2017   Chronic diastolic heart failure ([place]) 11/06/2016   Mediastinitis 06/28/2016   Wound, surgical, infected 06/06/2016   S/P CABG  (coronary artery bypass graft) 06/03/2016   Severe sepsis ([place]) 06/03/2016   GERD (gastroesophageal reflux disease) 05/08/2016   Morbid obesity ([place]) 05/08/2016   Type 2 diabetes mellitus with foot ulcer ([place]) 05/08/2016   Sinus tachycardia 05/08/2016   Acute chest pain 05/08/2016   Burping 05/08/2016   Peripheral vascular disease ([place]) 05/08/2016   Tobacco use disorder 04/20/2016   Coronary artery disease involving native coronary artery of native heart with angina pectoris ([place]) 09/12/2015   Essential hypertension 09/12/2015   Hyperlipidemia 09/12/2015    Current Outpatient Medications on File Prior to Visit  Medication Sig Dispense Refill   acetaminophen (TYLENOL) 325 MG tablet Take 2 tablets (650 mg total) by mouth every 4 (four) hours as needed for headache or mild pain.     amiodarone (PACERONE) 200 MG tablet Take 1 tablet (200 mg total) by mouth daily. 90 tablet 3   ascorbic acid (VITAMIN C) 250 MG tablet Take by mouth.     aspirin 81 MG chewable tablet Chew 1 tablet by mouth daily.     cefdinir (OMNICEF) 300 MG capsule Take 300 mg by mouth 2 (two) times daily.     clopidogrel (PLAVIX) 75 MG tablet Take 1 tablet (75 mg total) by mouth daily. 90 tablet 3   clopidogrel (PLAVIX) 75 MG tablet Take by mouth.     dapagliflozin propanediol (FARXIGA) 10 MG TABS tablet Take 10 mg by mouth daily.     docusate sodium (COLACE) 100 MG capsule Take 1 capsule (100 mg total) by mouth 2 (two) times daily. 10 capsule 0   Evolocumab (REPATHA SURECLICK) 258 MG/ML  SOAJ Inject 140 mg into the skin every 14 (fourteen) days.     fenofibrate (TRICOR) 145 MG tablet Take 1 tablet by mouth once daily 390 tablet 2   furosemide (LASIX) 20 MG tablet Take 20 mg by mouth daily.     glipiZIDE (GLUCOTROL) 10 MG tablet Take by mouth.     HUMALOG KWIKPEN 100 UNIT/ML KwikPen Inject 10-50 Units into the skin at bedtime. Sliding scale 15 mL 12   HYDROmorphone (DILAUDID) 4 MG tablet TAKE 1 TABLET BY MOUTH EVERY 6 HOURS  AS NEEDED FOR SEVERE PAIN FOR UP TO 5 DAYS (Patient not taking: Reported on 05/08/2022)     insulin degludec (TRESIBA FLEXTOUCH) 100 UNIT/ML FlexTouch Pen Inject 34 Units into the skin daily.     isosorbide mononitrate (IMDUR) 30 MG 24 hr tablet Take 1 tablet (30 mg total) by mouth daily. 90 tablet 3   LORazepam (ATIVAN) 1 MG tablet Take 1 mg by mouth as needed for anxiety.     metFORMIN (GLUCOPHAGE) 500 MG tablet Take 1 tablet (500 mg total) by mouth 2 (two) times daily with a meal. 180 tablet 1   metoprolol succinate (TOPROL-XL) 25 MG 24 hr tablet Take 1 tablet (25 mg total) by mouth in the morning, at noon, and at bedtime. 270 tablet 3   nitroGLYCERIN (NITROSTAT) 0.4 MG SL tablet Place 1 tablet (0.4 mg total) under the tongue every 5 (five) minutes as needed for chest pain. 25 tablet 6   nystatin (MYCOSTATIN/NYSTOP) powder Apply 1 application topically daily as needed (rash/yeast).     omeprazole (PRILOSEC) 20 MG capsule Take 1 capsule (20 mg total) by mouth daily. 90 capsule 1   rosuvastatin (CRESTOR) 20 MG tablet Take 1 tablet (20 mg total) by mouth daily. 90 tablet 3   senna-docusate (SENOKOT-S) 8.6-50 MG tablet Take 1 tablet by mouth daily.     sitaGLIPtin (JANUVIA) 100 MG tablet Take by mouth.     valsartan (DIOVAN) 80 MG tablet Take 1 tablet (80 mg total) by mouth 2 (two) times daily. 180 tablet 3   vitamin B-12 (CYANOCOBALAMIN) 1000 MCG tablet Take 1,000 mcg by mouth daily.     vitamin C (ASCORBIC ACID) 500 MG tablet Take 500 mg by mouth daily.     Vitamin D, Ergocalciferol, (DRISDOL) 1.25 MG (50000 UNIT) CAPS capsule Take 1 capsule (50,000 Units total) by mouth every Saturday. 12 capsule 1   No current facility-administered medications on file prior to visit.    Allergies  Allergen Reactions   Vancomycin Anaphylaxis   Chlorhexidine Gluconate Itching    Received CHG bath, began itching, required benadryl   Cat Hair Extract Other (See Comments)    Sneezing, watery eyes.   Tramadol  Other (See Comments)    Hallucinations   Codeine Rash    Objective: There were no vitals filed for this visit.  General: No acute distress, AAOx3  Left foot/TMA amputation stump: Incision appears to be healed, deep grooves to the stump but no underlying openings, dry scabbing, no warmth no malodor, no significant erythema, no clinical signs of infection noted, DP and PT pedal pulses palpable, varicosities to the lower leg on the left, no pain with palpation to calf and no limitation or pain with range of motion to ankle, status post left foot transmetatarsal amputation with revision.  Assessment and Plan:  Problem List Items Addressed This Visit   None Visit Diagnoses     S/P transmetatarsal amputation of foot, left ([place])    -  Primary   Relevant Orders   DG Foot Complete Left (Completed)   Uncontrolled type 2 diabetes mellitus with hyperglycemia (HCC)       Other chronic osteomyelitis of left foot (HCC)       Left foot pain       History of UTI            -Patient seen and evaluated -Xrays consistent with TMA status no new areas of osteolysis  -Continue with surgical shoe until patient can be fitted for diabetic shoes and custom molded insole with Aaron Edelman  -Continue with nonweightbearing when she goes outside of the home with use of wheelchair/Rollater. Patient has been unable to work since 2021 right hallux amputation and has been further incapacitated since Left hallux amputation 2022 and later TMA with revision 01/2022 -Advised patient to continue with Tylenol as needed for pain -Continue with rest and elevation for pain and edema control -Return to office as scheduled on tomorrow for diabetic shoes with custom insoles, toe filler on right and forefoot filler on left.   Dr. Landis Martins [facility] [phone] office

## 2022-05-16 ENCOUNTER — Ambulatory Visit (INDEPENDENT_AMBULATORY_CARE_PROVIDER_SITE_OTHER): Payer: Managed Care, Other (non HMO)

## 2022-05-16 DIAGNOSIS — Z89432 Acquired absence of left foot: Secondary | ICD-10-CM

## 2022-05-16 NOTE — Progress Notes (Signed)
SITUATION Reason for Consult: Evaluation for Bilateral Custom Foot Orthoses Patient / Caregiver Report: Patient is ready for foot orthotics  OBJECTIVE DATA: Patient History / Diagnosis:    ICD-10-CM   1. Status post transmetatarsal amputation of foot, left ([place])  M42.683       Current or Previous Devices:   Current user of right diabetic insole  Foot Examination: Skin presentation:   Compromised Ulcers & [place]:   Historical Toe / Foot Deformities:  Right hallux amp, left transmet amp Weight Bearing Presentation:  Planus Sensation:    Compromised  Shoe Size:    10W  ORTHOTIC RECOMMENDATION Recommended Device: 1x pair of custom functional foot orthotics  GOALS OF ORTHOSES - Reduce Pain - Prevent Foot Deformity - Prevent Progression of Further Foot Deformity - Relieve Pressure - Improve the Overall Biomechanical Function of the Foot and Lower Extremity.  ACTIONS PERFORMED Potential out of pocket cost was communicated to patient. Patient understood and consent to casting. Patient was casted for Foot Orthoses via crush box. Procedure was explained and patient tolerated procedure well. Casts were shipped to central fabrication. All questions were answered and concerns addressed.  PLAN Patient is to be called for fitting when devices are ready.

## 2022-05-18 ENCOUNTER — Ambulatory Visit: Payer: Managed Care, Other (non HMO) | Admitting: Physician Assistant

## 2022-05-22 ENCOUNTER — Ambulatory Visit (INDEPENDENT_AMBULATORY_CARE_PROVIDER_SITE_OTHER): Payer: Managed Care, Other (non HMO) | Admitting: Physician Assistant

## 2022-05-22 ENCOUNTER — Encounter: Payer: Self-pay | Admitting: Physician Assistant

## 2022-05-22 VITALS — BP 142/68 | HR 79 | Temp 97.8°F | Resp 18 | Ht 67.0 in | Wt 228.0 lb

## 2022-05-22 DIAGNOSIS — E559 Vitamin D deficiency, unspecified: Secondary | ICD-10-CM

## 2022-05-22 DIAGNOSIS — I5032 Chronic diastolic (congestive) heart failure: Secondary | ICD-10-CM

## 2022-05-22 DIAGNOSIS — Z89432 Acquired absence of left foot: Secondary | ICD-10-CM

## 2022-05-22 DIAGNOSIS — E0865 Diabetes mellitus due to underlying condition with hyperglycemia: Secondary | ICD-10-CM | POA: Diagnosis not present

## 2022-05-22 DIAGNOSIS — S98111A Complete traumatic amputation of right great toe, initial encounter: Secondary | ICD-10-CM

## 2022-05-22 DIAGNOSIS — E782 Mixed hyperlipidemia: Secondary | ICD-10-CM

## 2022-05-22 DIAGNOSIS — I200 Unstable angina: Secondary | ICD-10-CM

## 2022-05-22 DIAGNOSIS — I739 Peripheral vascular disease, unspecified: Secondary | ICD-10-CM

## 2022-05-22 DIAGNOSIS — I10 Essential (primary) hypertension: Secondary | ICD-10-CM | POA: Diagnosis not present

## 2022-05-22 DIAGNOSIS — F419 Anxiety disorder, unspecified: Secondary | ICD-10-CM

## 2022-05-22 DIAGNOSIS — R928 Other abnormal and inconclusive findings on diagnostic imaging of breast: Secondary | ICD-10-CM

## 2022-05-22 DIAGNOSIS — Z794 Long term (current) use of insulin: Secondary | ICD-10-CM

## 2022-05-22 NOTE — Progress Notes (Unsigned)
Subjective:  [name], female    DOB: 10-02-1961  Age: 61 y.o. MRN: [id number]  Chief Complaint  Patient presents with   Diabetes    HPI  Pt with longstanding history of diabetes with secondary complications of neuropathy and PAD - she has had right great toe amputated and also her left midfoot/all toes amputated.  She follows Dr Cannon Kettle regularly for foot exams/follow up.  She is currently on farxiga 15m qd, tresiba 40 units in am, humalog sliding scale using 20-25 units bid and glucophage 5068mbid.  She states at home her glucose ranges between 100-200.  She was referred to dietician at last visit but has not gone to that appt.  She was also told with last labwork she needed to see nephrology for demise of kidney functions and she has refused to scheduled that appt at this time - will make further suggestions pending labwork today Pt states she had eye exam last week  Pt with history of sinus tachycardia with defibrillator in place.  She follows with Dr MuBettina Gaviand has appt with him on 06/2022.  She is currently on Pacerone 20047mASA 73m15mlavix 75mg80me also has history of CAD with last bypass in 2017 or 2018 according to patient.  She is taking toprol XL 25mg,34mur 30mg a40mas nitrostat if needed Pt with history of hypertension - see above medications and is also on valsartan 80mg qd38mt with history of hyperlipidemia - currently on crestor 20mg qd 46mico 145mg and 69mtha  Pt with history of Vit D - due for labwork - is taking weekly supplement  Pt with history of GERD - stable on crestor 20mg qd  P34mth history of anxiety - uses lorazepam as needed and requests refill of medication   Pt with history of abnormal mammogram - is due to schedule bilateral diagnostic mammogram  Pt with history  Current Outpatient Medications on File Prior to Visit  Medication Sig Dispense Refill   acetaminophen (TYLENOL) 325 MG tablet Take 2 tablets (650 mg total) by mouth  every 4 (four) hours as needed for headache or mild pain.     amiodarone (PACERONE) 200 MG tablet Take 1 tablet (200 mg total) by mouth daily. 90 tablet 3   ascorbic acid (VITAMIN C) 250 MG tablet Take by mouth.     aspirin 81 MG chewable tablet Chew 1 tablet by mouth daily.     clopidogrel (PLAVIX) 75 MG tablet Take 1 tablet (75 mg total) by mouth daily. 90 tablet 3   docusate sodium (COLACE) 100 MG capsule Take 1 capsule (100 mg total) by mouth 2 (two) times daily. 10 capsule 0   Evolocumab (REPATHA SURECLICK) 140 MG/ML S762 Inject 140 mg into the skin every 14 (fourteen) days.     fenofibrate (TRICOR) 145 MG tablet Take 1 tablet by mouth once daily 390 tablet 2   furosemide (LASIX) 20 MG tablet Take 20 mg by mouth daily.     HUMALOG KWIKPEN 100 UNIT/ML KwikPen Inject 10-50 Units into the skin at bedtime. Sliding scale 15 mL 12   insulin degludec (TRESIBA FLEXTOUCH) 100 UNIT/ML FlexTouch Pen Inject 40 Units into the skin daily.     isosorbide mononitrate (IMDUR) 30 MG 24 hr tablet Take 1 tablet (30 mg total) by mouth daily. 90 tablet 3   metFORMIN (GLUCOPHAGE) 500 MG tablet Take 1 tablet (500 mg total) by mouth 2 (two) times daily with a meal. 180  tablet 1   metoprolol succinate (TOPROL-XL) 25 MG 24 hr tablet Take 1 tablet (25 mg total) by mouth in the morning, at noon, and at bedtime. 270 tablet 3   nitroGLYCERIN (NITROSTAT) 0.4 MG SL tablet Place 1 tablet (0.4 mg total) under the tongue every 5 (five) minutes as needed for chest pain. 25 tablet 6   nystatin (MYCOSTATIN/NYSTOP) powder Apply 1 application topically daily as needed (rash/yeast).     omeprazole (PRILOSEC) 20 MG capsule Take 1 capsule (20 mg total) by mouth daily. 90 capsule 1   rosuvastatin (CRESTOR) 20 MG tablet Take 1 tablet (20 mg total) by mouth daily. 90 tablet 3   senna-docusate (SENOKOT-S) 8.6-50 MG tablet Take 1 tablet by mouth daily.     valsartan (DIOVAN) 80 MG tablet Take 1 tablet (80 mg total) by mouth 2 (two) times  daily. 180 tablet 3   vitamin B-12 (CYANOCOBALAMIN) 1000 MCG tablet Take 1,000 mcg by mouth daily.     vitamin C (ASCORBIC ACID) 500 MG tablet Take 500 mg by mouth daily.     Vitamin D, Ergocalciferol, (DRISDOL) 1.25 MG (50000 UNIT) CAPS capsule Take 1 capsule (50,000 Units total) by mouth every Saturday. 12 capsule 1   No current facility-administered medications on file prior to visit.   Past Medical History:  Diagnosis Date   Abnormal EKG 10/23/2021   Abnormal mammogram of left breast 09/04/2021   Abnormal stress test 10/23/2021   Acute chest pain 05/08/2016   AICD (automatic cardioverter/defibrillator) present    St Jude/Abbott device   AKI (acute kidney injury) ([place]) 10/23/2021   Angina pectoris ([place]) 09/11/2017   Anxiety 10/23/2021   Bacteremia 11/22/2021   Breast wound, right, subsequent encounter 09/04/2021   Burping 05/08/2016   Cancer ([place])    cervical - hysterectomy   Cellulitis of right breast 10/23/2021   Cellulitis, wound, post-operative 10/09/2021   Chronic diastolic heart failure ([place]) 11/06/2016   Coronary artery disease involving native coronary artery of native heart with angina pectoris ([place]) 09/12/2015   Overview:  PCI and stent of RCA 2009, last cath 2012 with medical therapy  CABG May 2017   Demand ischemia [facility])    Diabetic foot infection ([place]) 11/21/2021   Diabetic foot ulcer ([place]) 02/16/2020   Emphysema lung ([place]) 09/11/2017   patient not aware of this dx   Essential hypertension 09/12/2015   Gangrene ([place]) 10/23/2021   GERD (gastroesophageal reflux disease) 05/08/2016   Hematoma 10/23/2021   Hiatal hernia 10/23/2021   Hyperglycemia 10/23/2021   Hyperglycemia due to type 2 diabetes mellitus ([place]) 10/23/2021   Hyperlipidemia 09/12/2015   Hyponatremia 10/23/2021   ICD (implantable cardioverter-defibrillator) in place 05/16/2021   Infection of great toe 10/23/2021   Lactic acidosis 10/23/2021   Leukocytosis 10/23/2021   Mediastinitis 06/28/2016   Morbid obesity ([place])  05/08/2016   Myocardial infarction [facility])    NSTEMI (non-ST elevated myocardial infarction) ([place]) 05/22/2020   Obesity (BMI 30-39.9) 10/23/2021   Perineal abscess 10/23/2021   Peripheral vascular disease ([place])    S/P CABG (coronary artery bypass graft) 06/03/2016   Overview:  The patient underwent sternal reconstruction on 06/21/16 with pec flaps for mediastinitis from a prior CABG in May 2017. On admission, she was critically ill from sepsis and had altered mental status. She was last seen in clinic on 08/09/16 at which time she was doing well.   Severe sepsis ([place]) 06/03/2016   Sinus tachycardia 05/08/2016   Tobacco use disorder 04/20/2016   Overview:  Quit  in May 2017.   Type 2 diabetes mellitus with foot ulcer ([place]) 05/08/2016   Unstable angina ([place]) 05/22/2020   Ventricular tachycardia ([place]) 12/17/2020   Wound, surgical, infected 06/06/2016   Overview:  sternal   Past Surgical History:  Procedure Laterality Date   ABDOMINAL AORTOGRAM W/LOWER EXTREMITY N/A 10/11/2021   Procedure: ABDOMINAL AORTOGRAM W/LOWER EXTREMITY;  Surgeon: Marty Heck, MD;  Location: [place] [facility];  Service: Cardiovascular;  Laterality: N/A;   AMPUTATION TOE Right 02/20/2020   Procedure: AMPUTATION RIGHT GREAT  TOE;  Surgeon: Felipa Furnace, DPM;  Location: [place];  Service: Podiatry;  Laterality: Right;   BLADDER SURGERY     BONE BIOPSY Left 02/01/2022   Procedure: BONE BIOPSY LEFT FOOT, IRRIGATION AND DEBRIDEMENT;  Surgeon: Landis Martins, DPM;  Location: [place];  Service: Podiatry;  Laterality: Left;   BUBBLE STUDY  11/27/2021   Procedure: BUBBLE STUDY;  Surgeon: Fay Records, MD;  Location: [place];  Service: Cardiovascular;;   CARDIAC CATHETERIZATION     CORONARY ARTERY BYPASS GRAFT     CORONARY STENT INTERVENTION N/A 05/25/2020   Procedure: CORONARY STENT INTERVENTION;  Surgeon: Wellington Hampshire, MD;  Location: [place] [facility];  Service: Cardiovascular;  Laterality: N/A;   CORONARY STENT  INTERVENTION N/A 06/22/2020   Procedure: CORONARY STENT INTERVENTION;  Surgeon: Leonie Man, MD;  Location: [place] [facility];  Service: Cardiovascular;  Laterality: N/A;   ICD IMPLANT N/A 12/19/2020   Procedure: ICD IMPLANT;  Surgeon: Evans Lance, MD;  Location: [place] [facility];  Service: Cardiovascular;  Laterality: N/A;   INCISION AND DRAINAGE Right 02/19/2020   Procedure: INCISION AND DRAINAGE;  Surgeon: Trula Slade, DPM;  Location: [place];  Service: Podiatry;  Laterality: Right;  Block done by surgeon   INTRAVASCULAR ULTRASOUND/IVUS N/A 06/22/2020   Procedure: Intravascular Ultrasound/IVUS;  Surgeon: Leonie Man, MD;  Location: [place] [facility];  Service: Cardiovascular;  Laterality: N/A;   IRRIGATION AND DEBRIDEMENT FOOT Left 10/10/2021   Procedure: IRRIGATION AND DEBRIDEMENT FOOT;  Surgeon: Landis Martins, DPM;  Location: [place];  Service: Podiatry;  Laterality: Left;   IRRIGATION AND DEBRIDEMENT FOOT Left 11/23/2021   Procedure: IRRIGATION AND DEBRIDEMENT FOOT;  Surgeon: Lorenda Peck, MD;  Location: [place];  Service: Podiatry;  Laterality: Left;  will do local block   LEFT HEART CATH AND CORS/GRAFTS ANGIOGRAPHY N/A 05/23/2020   Procedure: LEFT HEART CATH AND CORS/GRAFTS ANGIOGRAPHY;  Surgeon: Nelva Bush, MD;  Location: [place] [facility];  Service: Cardiovascular;  Laterality: N/A;   LEFT HEART CATH AND CORS/GRAFTS ANGIOGRAPHY N/A 12/19/2020   Procedure: LEFT HEART CATH AND CORS/GRAFTS ANGIOGRAPHY;  Surgeon: Burnell Blanks, MD;  Location: [place] [facility];  Service: Cardiovascular;  Laterality: N/A;   LEFT HEART CATH AND CORS/GRAFTS ANGIOGRAPHY N/A 07/21/2021   Procedure: LEFT HEART CATH AND CORS/GRAFTS ANGIOGRAPHY;  Surgeon: Nelva Bush, MD;  Location: [place] [facility];  Service: Cardiovascular;  Laterality: N/A;   METATARSAL HEAD EXCISION Right 05/02/2020   Procedure: FIRST METATARSAL HEAD RESECTION; RIGHT FOOT WOUND CLOSURE;  Surgeon: Landis Martins,  DPM;  Location: [place];  Service: Podiatry;  Laterality: Right;  MAC W/LOCAL   PERIPHERAL VASCULAR BALLOON ANGIOPLASTY Left 10/11/2021   Procedure: PERIPHERAL VASCULAR BALLOON ANGIOPLASTY;  Surgeon: Marty Heck, MD;  Location: [place] [facility];  Service: Cardiovascular;  Laterality: Left;  Anterior Tibial Artery   TEE WITHOUT CARDIOVERSION N/A 11/27/2021   Procedure: TRANSESOPHAGEAL  ECHOCARDIOGRAM (TEE);  Surgeon: Fay Records, MD;  Location: [facility];  Service: Cardiovascular;  Laterality: N/A;   TRANSMETATARSAL AMPUTATION Left 11/23/2021   Procedure: TRANSMETATARSAL AMPUTATION;  Surgeon: Lorenda Peck, MD;  Location: [place];  Service: Podiatry;  Laterality: Left;   TUBAL LIGATION      Family History  Problem Relation Age of Onset   Hypertension Mother    Hyperlipidemia Mother    Heart attack Father    Heart disease Father    Hypertension Father    Alzheimer's disease Father    Hypertension Brother    Hyperlipidemia Brother    Social History   Socioeconomic History   Marital status: Married    Spouse name: Not on file   Number of children: Not on file   Years of education: Not on file   Highest education level: Not on file  Occupational History   Not on file  Tobacco Use   Smoking status: Former    Types: Cigarettes    Quit date: 05/2016    Years since quitting: 6.0   Smokeless tobacco: Never  Vaping Use   Vaping Use: Never used  Substance and Sexual Activity   Alcohol use: Yes    Comment: social wine   Drug use: No   Sexual activity: Yes    Birth control/protection: Surgical    Comment: Hysterectomy  Other Topics Concern   Not on file  Social History Narrative   Not on file   Social Determinants of Health   Financial Resource Strain: Not on file  Food Insecurity: Not on file  Transportation Needs: Not on file  Physical Activity: Not on file  Stress: Not on file  Social Connections: Not on file    Review of  Systems  CONSTITUTIONAL: Negative for chills, fatigue, fever, unintentional weight gain and unintentional weight loss.  E/N/T: Negative for ear pain, nasal congestion and sore throat.  CARDIOVASCULAR: Negative for chest pain, dizziness, palpitations and pedal edema.  RESPIRATORY: Negative for recent cough and dyspnea.  GASTROINTESTINAL: Negative for abdominal pain, acid reflux symptoms, constipation, diarrhea, nausea and vomiting.  MSK: Negative for arthralgias and myalgias.  INTEGUMENTARY: Negative for rash.  NEUROLOGICAL: Negative for dizziness and headaches.  PSYCHIATRIC: Negative for sleep disturbance and to question depression screen.  Negative for depression, negative for anhedonia.      Objective:  PHYSICAL EXAM:   VS: BP (!) 142/68   Pulse 79   Temp 97.8 F (36.6 C)   Resp 18   Ht _0  (1.702 m)   Wt 228 lb (103.4 kg)   LMP  (LMP Unknown)   SpO2 98%   BMI 35.71 kg/m   GEN: Well nourished, well developed, in no acute distress  Cardiac: RRR; no murmurs, rubs, or gallops,no edema - no significant varicosities Respiratory:  normal respiratory rate and pattern with no distress - normal breath sounds with no rales, rhonchi, wheezes or rubs GI: normal bowel sounds, no masses or tenderness  Skin: warm and dry, no rash  Neuro:  Alert and Oriented x 3, Strength and sensation are intact - CN II-Xii grossly intact Psych: euthymic mood, appropriate affect and demeanor Diabetic Foot Exam - Simple   Simple Foot Form  05/22/2022  4:00 PM  Visual Inspection Sensation Testing Pulse Check Comments Pt has amputation of right great toe Amputation of all of left toes and left forefoot      Lab Results  Component Value Date   WBC 7.1 05/22/2022   HGB 11.5  05/22/2022   HCT 34.8 05/22/2022   PLT 269 05/22/2022   GLUCOSE 280 (H) 05/22/2022   CHOL 175 05/22/2022   TRIG 371 (H) 05/22/2022   HDL 53 05/22/2022   LDLDIRECT 91.4 05/22/2020   LDLCALC 65 05/22/2022   ALT 23  05/22/2022   AST 25 05/22/2022   NA 137 05/22/2022   K 5.2 05/22/2022   CL 104 05/22/2022   CREATININE 1.13 (H) 05/22/2022   BUN 24 05/22/2022   CO2 23 05/22/2022   TSH 2.270 05/22/2022   INR 1.1 07/20/2021   HGBA1C 8.9 (H) 05/22/2022   MICROALBUR 293.5 (H) 07/21/2021      Assessment & Plan:   Problem List Items Addressed This Visit       Cardiovascular and Mediastinum   Chronic diastolic heart failure ([place])   Relevant Orders   CBC with Differential/Platelet (Completed)   Comprehensive metabolic panel (Completed) Follow up with cardiology Continue current meds   Essential hypertension   Relevant Orders   CBC with Differential/Platelet (Completed)   Comprehensive metabolic panel (Completed)   TSH (Completed) Cpntinue meds   Unstable angina ([place]) Continue meds Follow up with cardiology   Peripheral vascular disease ([place])     Other   Hyperlipidemia   Relevant Orders   Lipid panel (Completed) Watch diet Continue meds   Anxiety   Relevant Medications   LORazepam (ATIVAN) 1 MG tablet   Other Visit Diagnoses     Diabetes mellitus due to underlying condition with hyperglycemia, with long-term current use of insulin ([place])    -  Primary   Relevant Medications   dapagliflozin propanediol (FARXIGA) 10 MG TABS tablet   Other Relevant Orders   CBC with Differential/Platelet (Completed)   Comprehensive metabolic panel (Completed)   Hemoglobin A1c (Completed) Call for appt with dietician   Partial nontraumatic amputation of left foot ([place])       Vitamin D deficiency       Relevant Orders   VITAMIN D 25 Hydroxy (Vit-D Deficiency, Fractures) (Completed)   Abnormal mammogram       Relevant Orders   MM Digital Diagnostic Bilat   Amputation of right great toe ([place])  Continue follow up with podiatry        .  Meds ordered this encounter  Medications   LORazepam (ATIVAN) 1 MG tablet    Sig: Take 1 tablet (1 mg total) by mouth as needed for anxiety.    Dispense:   30 tablet    Refill:  0    Order Specific Question:   Supervising Provider    Answer:   Shelton Silvas   dapagliflozin propanediol (FARXIGA) 10 MG TABS tablet    Sig: Take 1 tablet (10 mg total) by mouth daily.    Dispense:  30 tablet    Refill:  2    Order Specific Question:   Supervising Provider    AnswerShelton Silvas    Orders Placed This Encounter  Procedures   MM Digital Diagnostic Bilat   CBC with Differential/Platelet   Comprehensive metabolic panel   TSH   Lipid panel   Hemoglobin A1c   VITAMIN D 25 Hydroxy (Vit-D Deficiency, Fractures)   Cardiovascular Risk Assessment     Follow-up: Return in about 3 months (around 08/22/2022) for chronic fasting --- 40 minutes!.  An After Visit Summary was printed and given to the patient.  Yetta Flock [facility] [phone]

## 2022-05-23 LAB — LIPID PANEL
Chol/HDL Ratio: 3.3 ratio (ref 0.0–4.4)
Cholesterol, Total: 175 mg/dL (ref 100–199)
HDL: 53 mg/dL (ref >39)
LDL Chol Calc (NIH): 65 mg/dL (ref 0–99)
Triglycerides: 371 mg/dL — ABNORMAL HIGH (ref 0–149)
VLDL Cholesterol Cal: 57 mg/dL — ABNORMAL HIGH (ref 5–40)

## 2022-05-23 LAB — HEMOGLOBIN A1C
Est. average glucose Bld gHb Est-mCnc: 209 mg/dL
Hgb A1c MFr Bld: 8.9 % — ABNORMAL HIGH (ref 4.8–5.6)

## 2022-05-23 LAB — TSH: TSH: 2.27 u[IU]/mL (ref 0.450–4.500)

## 2022-05-23 LAB — COMPREHENSIVE METABOLIC PANEL
ALT: 23 IU/L (ref 0–32)
AST: 25 IU/L (ref 0–40)
Albumin/Globulin Ratio: 1.6 (ref 1.2–2.2)
Albumin: 4.4 g/dL (ref 3.8–4.8)
Alkaline Phosphatase: 50 IU/L (ref 44–121)
BUN/Creatinine Ratio: 21 (ref 12–28)
BUN: 24 mg/dL (ref 8–27)
Bilirubin Total: 0.2 mg/dL (ref 0.0–1.2)
CO2: 23 mmol/L (ref 20–29)
Calcium: 10.1 mg/dL (ref 8.7–10.3)
Chloride: 104 mmol/L (ref 96–106)
Creatinine, Ser: 1.13 mg/dL — ABNORMAL HIGH (ref 0.57–1.00)
Globulin, Total: 2.7 g/dL (ref 1.5–4.5)
Glucose: 280 mg/dL — ABNORMAL HIGH (ref 70–99)
Potassium: 5.2 mmol/L (ref 3.5–5.2)
Sodium: 137 mmol/L (ref 134–144)
Total Protein: 7.1 g/dL (ref 6.0–8.5)
eGFR: 55 mL/min/{1.73_m2} — ABNORMAL LOW (ref >59)

## 2022-05-23 LAB — CBC WITH DIFFERENTIAL/PLATELET
Basophils Absolute: 0.1 10*3/uL (ref 0.0–0.2)
Basos: 1 %
EOS (ABSOLUTE): 0.2 10*3/uL (ref 0.0–0.4)
Eos: 2 %
Hematocrit: 34.8 % (ref 34.0–46.6)
Hemoglobin: 11.5 g/dL (ref 11.1–15.9)
Immature Grans (Abs): 0 10*3/uL (ref 0.0–0.1)
Immature Granulocytes: 0 %
Lymphocytes Absolute: 2 10*3/uL (ref 0.7–3.1)
Lymphs: 28 %
MCH: 29.6 pg (ref 26.6–33.0)
MCHC: 33 g/dL (ref 31.5–35.7)
MCV: 90 fL (ref 79–97)
Monocytes Absolute: 0.5 10*3/uL (ref 0.1–0.9)
Monocytes: 7 %
Neutrophils Absolute: 4.4 10*3/uL (ref 1.4–7.0)
Neutrophils: 62 %
Platelets: 269 10*3/uL (ref 150–450)
RBC: 3.88 x10E6/uL (ref 3.77–5.28)
RDW: 13.5 % (ref 11.7–15.4)
WBC: 7.1 10*3/uL (ref 3.4–10.8)

## 2022-05-23 LAB — VITAMIN D 25 HYDROXY (VIT D DEFICIENCY, FRACTURES): Vit D, 25-Hydroxy: 28.3 ng/mL — ABNORMAL LOW (ref 30.0–100.0)

## 2022-05-23 LAB — CARDIOVASCULAR RISK ASSESSMENT

## 2022-05-23 MED ORDER — DAPAGLIFLOZIN PROPANEDIOL 10 MG PO TABS
10.0000 mg | ORAL_TABLET | Freq: Every day | ORAL | 2 refills | Status: DC
Start: 2022-05-23 — End: 2022-07-31

## 2022-05-23 MED ORDER — LORAZEPAM 1 MG PO TABS
1.0000 mg | ORAL_TABLET | ORAL | 0 refills | Status: DC | PRN
Start: 2022-05-23 — End: 2024-05-26

## 2022-05-24 ENCOUNTER — Encounter: Payer: Self-pay | Admitting: Physician Assistant

## 2022-05-25 ENCOUNTER — Other Ambulatory Visit: Payer: Self-pay | Admitting: Internal Medicine

## 2022-05-28 ENCOUNTER — Ambulatory Visit: Payer: Managed Care, Other (non HMO) | Admitting: Podiatry

## 2022-05-28 ENCOUNTER — Encounter: Payer: Self-pay | Admitting: Podiatry

## 2022-05-28 DIAGNOSIS — Z89432 Acquired absence of left foot: Secondary | ICD-10-CM

## 2022-05-28 NOTE — Progress Notes (Signed)
Subjective: Jasmine Richardson is a 61 y.o. female patient seen today in office for POV #16 date of surgery 02/01/2022 left foot irrigation and debridement with bone biopsy and primary closure at TMA stump site essentially a revision at [facility].  Reports she is doing well.   Patient Active Problem List   Diagnosis Date Noted   AICD (automatic cardioverter/defibrillator) present 03/02/2022   Myocardial infarction ([place]) 03/02/2022   Cancer ([place]) 02/28/2022   Bacteremia 11/22/2021   Diabetic foot infection ([place]) 11/21/2021   Abnormal stress test 10/23/2021   Abnormal EKG 10/23/2021   AKI (acute kidney injury) ([place]) 10/23/2021   Anxiety 10/23/2021   Cellulitis of right breast 10/23/2021   Gangrene ([place]) 10/23/2021   Hematoma 10/23/2021   Hiatal hernia 10/23/2021   Hyperglycemia 10/23/2021   Hyperglycemia due to type 2 diabetes mellitus ([place]) 10/23/2021   Hyponatremia 10/23/2021   Infection of great toe 10/23/2021   Lactic acidosis 10/23/2021   Leukocytosis 10/23/2021   Obesity (BMI 30-39.9) 10/23/2021   Perineal abscess 10/23/2021   Cellulitis, wound, post-operative 10/09/2021   Abnormal mammogram of left breast 09/04/2021   Breast wound, right, subsequent encounter 09/04/2021   Demand ischemia ([place])    ICD (implantable cardioverter-defibrillator) in place 05/16/2021   Ventricular tachycardia 12/17/2020   Unstable angina ([place]) 05/22/2020   NSTEMI (non-ST elevated myocardial infarction) ([place]) 05/22/2020   Diabetic foot ulcer ([place]) 02/16/2020   Angina pectoris ([place]) 09/11/2017   Emphysema lung ([place]) 09/11/2017   Chronic diastolic heart failure ([place]) 11/06/2016   Mediastinitis 06/28/2016   Wound, surgical, infected 06/06/2016   S/P CABG (coronary artery bypass graft) 06/03/2016   Severe sepsis ([place]) 06/03/2016   GERD (gastroesophageal reflux disease) 05/08/2016   Morbid obesity ([place]) 05/08/2016   Type 2 diabetes mellitus with foot ulcer ([place]) 05/08/2016   Sinus tachycardia  05/08/2016   Acute chest pain 05/08/2016   Burping 05/08/2016   Peripheral vascular disease ([place]) 05/08/2016   Tobacco use disorder 04/20/2016   Coronary artery disease involving native coronary artery of native heart with angina pectoris ([place]) 09/12/2015   Essential hypertension 09/12/2015   Hyperlipidemia 09/12/2015    Current Outpatient Medications on File Prior to Visit  Medication Sig Dispense Refill   acetaminophen (TYLENOL) 325 MG tablet Take 2 tablets (650 mg total) by mouth every 4 (four) hours as needed for headache or mild pain.     amiodarone (PACERONE) 200 MG tablet Take 1 tablet (200 mg total) by mouth daily. 90 tablet 3   ascorbic acid (VITAMIN C) 250 MG tablet Take by mouth.     aspirin 81 MG chewable tablet Chew 1 tablet by mouth daily.     clopidogrel (PLAVIX) 75 MG tablet Take 1 tablet (75 mg total) by mouth daily. 90 tablet 3   dapagliflozin propanediol (FARXIGA) 10 MG TABS tablet Take 1 tablet (10 mg total) by mouth daily. 30 tablet 2   docusate sodium (COLACE) 100 MG capsule Take 1 capsule (100 mg total) by mouth 2 (two) times daily. 10 capsule 0   Evolocumab (REPATHA SURECLICK) 465 MG/ML SOAJ Inject 140 mg into the skin every 14 (fourteen) days.     fenofibrate (TRICOR) 145 MG tablet Take 1 tablet by mouth once daily 390 tablet 2   furosemide (LASIX) 20 MG tablet Take 20 mg by mouth daily.     HUMALOG KWIKPEN 100 UNIT/ML KwikPen Inject 10-50 Units into the skin at bedtime. Sliding scale 15 mL 12   insulin degludec (TRESIBA FLEXTOUCH) 100 UNIT/ML FlexTouch  Pen Inject 40 Units into the skin daily.     isosorbide mononitrate (IMDUR) 30 MG 24 hr tablet Take 1 tablet (30 mg total) by mouth daily. 90 tablet 3   LORazepam (ATIVAN) 1 MG tablet Take 1 tablet (1 mg total) by mouth as needed for anxiety. 30 tablet 0   metFORMIN (GLUCOPHAGE) 500 MG tablet Take 1 tablet (500 mg total) by mouth 2 (two) times daily with a meal. 180 tablet 1   metoprolol succinate (TOPROL-XL) 25 MG  24 hr tablet Take 1 tablet (25 mg total) by mouth in the morning, at noon, and at bedtime. 270 tablet 3   nitroGLYCERIN (NITROSTAT) 0.4 MG SL tablet Place 1 tablet (0.4 mg total) under the tongue every 5 (five) minutes as needed for chest pain. 25 tablet 6   nystatin (MYCOSTATIN/NYSTOP) powder Apply 1 application topically daily as needed (rash/yeast).     omeprazole (PRILOSEC) 20 MG capsule Take 1 capsule (20 mg total) by mouth daily. 90 capsule 1   rosuvastatin (CRESTOR) 20 MG tablet Take 1 tablet (20 mg total) by mouth daily. 90 tablet 3   senna-docusate (SENOKOT-S) 8.6-50 MG tablet Take 1 tablet by mouth daily.     valsartan (DIOVAN) 80 MG tablet Take 1 tablet (80 mg total) by mouth 2 (two) times daily. 180 tablet 3   vitamin B-12 (CYANOCOBALAMIN) 1000 MCG tablet Take 1,000 mcg by mouth daily.     vitamin C (ASCORBIC ACID) 500 MG tablet Take 500 mg by mouth daily.     Vitamin D, Ergocalciferol, (DRISDOL) 1.25 MG (50000 UNIT) CAPS capsule Take 1 capsule (50,000 Units total) by mouth every Saturday. 12 capsule 1   No current facility-administered medications on file prior to visit.    Allergies  Allergen Reactions   Vancomycin Anaphylaxis   Chlorhexidine Gluconate Itching    Received CHG bath, began itching, required benadryl   Cat Hair Extract Other (See Comments)    Sneezing, watery eyes.   Tramadol Other (See Comments)    Hallucinations   Codeine Rash    Objective: There were no vitals filed for this visit.  General: No acute distress, AAOx3  Left foot/TMA amputation stump: Incision appears to be healed, deep grooves to the stump but no underlying openings, dry scabbing, no warmth no malodor, no significant erythema, no clinical signs of infection noted, DP and PT pedal pulses palpable, varicosities to the lower leg on the left, no pain with palpation to calf and no limitation or pain with range of motion to ankle, status post left foot transmetatarsal amputation with  revision.  Assessment and Plan:  Problem List Items Addressed This Visit   None Visit Diagnoses     Status post transmetatarsal amputation of foot, left ([place])    -  Primary   S/P transmetatarsal amputation of foot, left ([place])            -Patient seen and evaluated -Xrays consistent with TMA status no new areas of osteolysis  -Continue with surgical shoe until patient can get diabetic shoes.  -Continue with nonweightbearing when she goes outside of the home with use of wheelchair/Rollater. Patient has been unable to work since 2021 right hallux amputation and has been further incapacitated since Left hallux amputation 2022 and later TMA with revision 01/2022 -Advised patient to continue with Tylenol as needed for pain -Continue with rest and elevation for pain and edema control -Return to office 4-5 weeks   Dr. Lorenda Peck  [facility]  [phone] office

## 2022-05-28 NOTE — Telephone Encounter (Signed)
Not a Clinic patient.  Has a private PCP.

## 2022-05-30 ENCOUNTER — Telehealth: Payer: Self-pay | Admitting: Physician Assistant

## 2022-05-30 NOTE — Telephone Encounter (Signed)
   Jasmine Richardson has been scheduled for the following appointment:  WHAT: DIAGNOSTIC MAMMOGRAM WHERE: [facility] DATE: 06/14/22 TIME: 2:50 PM CHECK-IN  Patient has been made aware. [name]

## 2022-05-31 ENCOUNTER — Telehealth: Payer: Self-pay | Admitting: Cardiology

## 2022-05-31 ENCOUNTER — Other Ambulatory Visit: Payer: Self-pay

## 2022-05-31 MED ORDER — FUROSEMIDE 20 MG PO TABS: 20.0000 mg | ORAL_TABLET | Freq: Every day | ORAL | 1 refills | Status: DC

## 2022-05-31 NOTE — Telephone Encounter (Signed)
Pt has appt 07-08-22. Lasix '20mg'$  #30 1 ref sent to [facility].

## 2022-05-31 NOTE — Telephone Encounter (Signed)
*  STAT* If patient is at the pharmacy, call can be transferred to refill team.   1. Which medications need to be refilled? (please list name of each medication and dose if known) furosemide (LASIX) 20 MG tablet  2. Which pharmacy/location (including street and city if local pharmacy) is medication to be sent to? [place], [place]  3. Do they need a 30 day or 90 day supply? 67  Pt states that she is completely out and the pharmacy has not gotten the order.

## 2022-06-20 ENCOUNTER — Ambulatory Visit (INDEPENDENT_AMBULATORY_CARE_PROVIDER_SITE_OTHER): Payer: Managed Care, Other (non HMO)

## 2022-06-20 DIAGNOSIS — I472 Ventricular tachycardia, unspecified: Secondary | ICD-10-CM | POA: Diagnosis not present

## 2022-06-23 LAB — CUP PACEART REMOTE DEVICE CHECK
Battery Remaining Longevity: 95 mo
Battery Remaining Percentage: 85 %
Battery Voltage: 3.01 V
Brady Statistic AP VP Percent: 1 %
Brady Statistic AP VS Percent: 1.7 %
Brady Statistic AS VP Percent: 1 %
Brady Statistic AS VS Percent: 98 %
Brady Statistic RA Percent Paced: 1.7 %
Brady Statistic RV Percent Paced: 1 %
Date Time Interrogation Session: 20230704061232
HighPow Impedance: 73 Ohm
Implantable Lead Implant Date: 20220103
Implantable Lead Implant Date: 20220103
Implantable Lead Location: 753859
Implantable Lead Location: 753860
Implantable Pulse Generator Implant Date: 20220103
Lead Channel Impedance Value: 340 Ohm
Lead Channel Impedance Value: 430 Ohm
Lead Channel Pacing Threshold Amplitude: 0.75 V
Lead Channel Pacing Threshold Amplitude: 1 V
Lead Channel Pacing Threshold Pulse Width: 0.5 ms
Lead Channel Pacing Threshold Pulse Width: 0.5 ms
Lead Channel Sensing Intrinsic Amplitude: 12 mV
Lead Channel Sensing Intrinsic Amplitude: 4.6 mV
Lead Channel Setting Pacing Amplitude: 2 V
Lead Channel Setting Pacing Amplitude: 2.5 V
Lead Channel Setting Pacing Pulse Width: 0.5 ms
Lead Channel Setting Sensing Sensitivity: 0.5 mV
Pulse Gen Serial Number: 111037319

## 2022-07-02 ENCOUNTER — Ambulatory Visit (INDEPENDENT_AMBULATORY_CARE_PROVIDER_SITE_OTHER): Payer: Managed Care, Other (non HMO) | Admitting: Podiatry

## 2022-07-02 ENCOUNTER — Encounter: Payer: Self-pay | Admitting: Podiatry

## 2022-07-02 DIAGNOSIS — E1165 Type 2 diabetes mellitus with hyperglycemia: Secondary | ICD-10-CM

## 2022-07-02 DIAGNOSIS — Z89432 Acquired absence of left foot: Secondary | ICD-10-CM

## 2022-07-02 NOTE — Progress Notes (Signed)
Subjective: Jasmine Richardson is a 61 y.o. female patient seen today in office for POV #17 date of surgery 02/01/2022 left foot irrigation and debridement with bone biopsy and primary closure at TMA stump site . Doing very well and healed. Has been swimming.  Reports she is doing well.   Patient Active Problem List   Diagnosis Date Noted   AICD (automatic cardioverter/defibrillator) present 03/02/2022   Myocardial infarction ([place]) 03/02/2022   Cancer ([place]) 02/28/2022   Bacteremia 11/22/2021   Diabetic foot infection ([place]) 11/21/2021   Abnormal stress test 10/23/2021   Abnormal EKG 10/23/2021   AKI (acute kidney injury) ([place]) 10/23/2021   Anxiety 10/23/2021   Cellulitis of right breast 10/23/2021   Gangrene ([place]) 10/23/2021   Hematoma 10/23/2021   Hiatal hernia 10/23/2021   Hyperglycemia 10/23/2021   Hyperglycemia due to type 2 diabetes mellitus ([place]) 10/23/2021   Hyponatremia 10/23/2021   Infection of great toe 10/23/2021   Lactic acidosis 10/23/2021   Leukocytosis 10/23/2021   Obesity (BMI 30-39.9) 10/23/2021   Perineal abscess 10/23/2021   Cellulitis, wound, post-operative 10/09/2021   Abnormal mammogram of left breast 09/04/2021   Breast wound, right, subsequent encounter 09/04/2021   Demand ischemia ([place])    ICD (implantable cardioverter-defibrillator) in place 05/16/2021   Ventricular tachycardia 12/17/2020   Unstable angina ([place]) 05/22/2020   NSTEMI (non-ST elevated myocardial infarction) ([place]) 05/22/2020   Diabetic foot ulcer ([place]) 02/16/2020   Angina pectoris ([place]) 09/11/2017   Emphysema lung ([place]) 09/11/2017   Chronic diastolic heart failure ([place]) 11/06/2016   Mediastinitis 06/28/2016   Wound, surgical, infected 06/06/2016   S/P CABG (coronary artery bypass graft) 06/03/2016   Severe sepsis ([place]) 06/03/2016   GERD (gastroesophageal reflux disease) 05/08/2016   Morbid obesity ([place]) 05/08/2016   Type 2 diabetes mellitus with foot ulcer ([place]) 05/08/2016   Sinus  tachycardia 05/08/2016   Acute chest pain 05/08/2016   Burping 05/08/2016   Peripheral vascular disease ([place]) 05/08/2016   Tobacco use disorder 04/20/2016   Coronary artery disease involving native coronary artery of native heart with angina pectoris ([place]) 09/12/2015   Essential hypertension 09/12/2015   Hyperlipidemia 09/12/2015    Current Outpatient Medications on File Prior to Visit  Medication Sig Dispense Refill   acetaminophen (TYLENOL) 325 MG tablet Take 2 tablets (650 mg total) by mouth every 4 (four) hours as needed for headache or mild pain.     amiodarone (PACERONE) 200 MG tablet Take 1 tablet (200 mg total) by mouth daily. 90 tablet 3   ascorbic acid (VITAMIN C) 250 MG tablet Take by mouth.     aspirin 81 MG chewable tablet Chew 1 tablet by mouth daily.     clopidogrel (PLAVIX) 75 MG tablet Take 1 tablet (75 mg total) by mouth daily. 90 tablet 3   dapagliflozin propanediol (FARXIGA) 10 MG TABS tablet Take 1 tablet (10 mg total) by mouth daily. 30 tablet 2   docusate sodium (COLACE) 100 MG capsule Take 1 capsule (100 mg total) by mouth 2 (two) times daily. 10 capsule 0   Evolocumab (REPATHA SURECLICK) 416 MG/ML SOAJ Inject 140 mg into the skin every 14 (fourteen) days.     fenofibrate (TRICOR) 145 MG tablet Take 1 tablet by mouth once daily 390 tablet 2   furosemide (LASIX) 20 MG tablet Take 1 tablet (20 mg total) by mouth daily. 30 tablet 1   HUMALOG KWIKPEN 100 UNIT/ML KwikPen Inject 10-50 Units into the skin at bedtime. Sliding scale 15 mL 12  insulin degludec (TRESIBA FLEXTOUCH) 100 UNIT/ML FlexTouch Pen Inject 40 Units into the skin daily.     isosorbide mononitrate (IMDUR) 30 MG 24 hr tablet Take 1 tablet (30 mg total) by mouth daily. 90 tablet 3   LORazepam (ATIVAN) 1 MG tablet Take 1 tablet (1 mg total) by mouth as needed for anxiety. 30 tablet 0   metFORMIN (GLUCOPHAGE) 500 MG tablet Take 1 tablet (500 mg total) by mouth 2 (two) times daily with a meal. 180 tablet 1    metoprolol succinate (TOPROL-XL) 25 MG 24 hr tablet Take 1 tablet (25 mg total) by mouth in the morning, at noon, and at bedtime. 270 tablet 3   nitroGLYCERIN (NITROSTAT) 0.4 MG SL tablet Place 1 tablet (0.4 mg total) under the tongue every 5 (five) minutes as needed for chest pain. 25 tablet 6   nystatin (MYCOSTATIN/NYSTOP) powder Apply 1 application topically daily as needed (rash/yeast).     omeprazole (PRILOSEC) 20 MG capsule Take 1 capsule (20 mg total) by mouth daily. 90 capsule 1   rosuvastatin (CRESTOR) 20 MG tablet Take 1 tablet (20 mg total) by mouth daily. 90 tablet 3   senna-docusate (SENOKOT-S) 8.6-50 MG tablet Take 1 tablet by mouth daily.     valsartan (DIOVAN) 80 MG tablet Take 1 tablet (80 mg total) by mouth 2 (two) times daily. 180 tablet 3   vitamin B-12 (CYANOCOBALAMIN) 1000 MCG tablet Take 1,000 mcg by mouth daily.     vitamin C (ASCORBIC ACID) 500 MG tablet Take 500 mg by mouth daily.     Vitamin D, Ergocalciferol, (DRISDOL) 1.25 MG (50000 UNIT) CAPS capsule Take 1 capsule (50,000 Units total) by mouth every Saturday. 12 capsule 1   No current facility-administered medications on file prior to visit.    Allergies  Allergen Reactions   Vancomycin Anaphylaxis   Chlorhexidine Gluconate Itching    Received CHG bath, began itching, required benadryl   Cat Hair Extract Other (See Comments)    Sneezing, watery eyes.   Tramadol Other (See Comments)    Hallucinations   Codeine Rash    Objective: There were no vitals filed for this visit.  General: No acute distress, AAOx3  Left foot/TMA amputation stump: Incision appears to be healed, deep grooves to the stump but no underlying openings, dry scabbing, no warmth no malodor, no significant erythema, no clinical signs of infection noted, DP and PT pedal pulses palpable, varicosities to the lower leg on the left, no pain with palpation to calf and no limitation or pain with range of motion to ankle, status post left foot  transmetatarsal amputation with revision.  Assessment and Plan:  Problem List Items Addressed This Visit   None     -Patient seen and evaluated -Xrays consistent with TMA status no new areas of osteolysis  -Continue with surgical shoe until patient can get diabetic shoes.  -Wound dehiscence healed.  -May be WBAT in DM shoes. Should be getting next week.  -Continue with rest and elevation for pain and edema control -Return to office 3 months for rfc.   Dr. Lorenda Peck  [facility] [phone] office

## 2022-07-04 ENCOUNTER — Other Ambulatory Visit: Payer: Self-pay

## 2022-07-04 DIAGNOSIS — R928 Other abnormal and inconclusive findings on diagnostic imaging of breast: Secondary | ICD-10-CM

## 2022-07-05 ENCOUNTER — Encounter: Payer: Self-pay | Admitting: Cardiology

## 2022-07-05 ENCOUNTER — Ambulatory Visit (INDEPENDENT_AMBULATORY_CARE_PROVIDER_SITE_OTHER): Payer: Managed Care, Other (non HMO) | Admitting: Cardiology

## 2022-07-05 VITALS — BP 120/80 | HR 77 | Ht 67.0 in | Wt 226.0 lb

## 2022-07-05 DIAGNOSIS — I110 Hypertensive heart disease with heart failure: Secondary | ICD-10-CM

## 2022-07-05 DIAGNOSIS — Z79899 Other long term (current) drug therapy: Secondary | ICD-10-CM | POA: Diagnosis not present

## 2022-07-05 DIAGNOSIS — E782 Mixed hyperlipidemia: Secondary | ICD-10-CM

## 2022-07-05 DIAGNOSIS — I25119 Atherosclerotic heart disease of native coronary artery with unspecified angina pectoris: Secondary | ICD-10-CM

## 2022-07-05 DIAGNOSIS — I472 Ventricular tachycardia, unspecified: Secondary | ICD-10-CM

## 2022-07-05 DIAGNOSIS — I5032 Chronic diastolic (congestive) heart failure: Secondary | ICD-10-CM

## 2022-07-05 DIAGNOSIS — Z95810 Presence of automatic (implantable) cardiac defibrillator: Secondary | ICD-10-CM

## 2022-07-05 NOTE — Patient Instructions (Signed)

## 2022-07-05 NOTE — Progress Notes (Signed)
Cardiology Office Note:    Date:  07/05/2022   ID:  Jasmine Richardson, DOB 07/07/1961, MRN [id number]  PCP:  Marge Duncans, PA-C  Cardiologist:  Shirlee More, MD    Referring MD: Marge Duncans, PA-C    ASSESSMENT:    1. Coronary artery disease involving native coronary artery of native heart with angina pectoris ([place])   2. Hypertensive heart disease with chronic diastolic congestive heart failure ([place])   3. Ventricular tachycardia ([place])   4. On amiodarone therapy   5. ICD (implantable cardioverter-defibrillator) in place   6. Mixed hyperlipidemia    PLAN:    In order of problems listed above:  Jasmine Richardson has done well stable CAD having no angina continue intensive medical therapy including dual antiplatelet her beta-blocker and combined PCSK9 inhibitor high intensity statin and fenofibrate for lipid disorder No evidence of fluid overload continue her minimum dose of diuretic and antihypertensives including ARB beta-blocker. Continue low-dose amiodarone normal thyroid and liver function no toxicity Stable ICD followed in our device clinic LDL at target continue her current combined therapy   Next appointment: 6 months   Medication Adjustments/Labs and Tests Ordered: Current medicines are reviewed at length with the patient today.  Concerns regarding medicines are outlined above.  No orders of the defined types were placed in this encounter.  No orders of the defined types were placed in this encounter.   Chief Complaint  Patient presents with   Follow-up   Coronary Artery Disease   Congestive Heart Failure    History of Present Illness:    Jasmine Richardson is a 61 y.o. female with a hx of CAD with CABG 5852 complicated by postoperative mediastinitis reoperation at [facility] with myocutaneous pectoralis flap closure ACS with PCI and stent native right coronary artery 05/25/2020 with oclusion of the vein graft to the right coronary artery and subsequent complex PCI and stent to  the coronary artery with shockwave intravascular lithotripsy 07/07/202 with dissection of a  small caliber first diagonal branch.  Other problems include ventricular tachycardia treated with IV amiodarone and lidocaine conversion to sinus rhythm subsequently loaded with amiodarone and had an ICD placed at [facility]  Other problems include diabetes mellitus hyperlipidemia hypertensive heart disease with chronic diastolic heart failure.  She was admitted to [facility] on 07/20/2021 with chest pain she underwent repeat coronary arteriography anatomy unchanged from the previous catheterization January 2022 left thoracic artery remains widely patent left anterior descending and stent left main coronary artery right coronary artery were widely patent with occlusion of the vein grafts to the right coronary artery left circumflex marginal and diagonal branch. In hospital high-sensitivity troponin was elevated peak 33.  She was last seen 08/29/2021.  Compliance with diet, lifestyle and medications: Yes  Fortunately she is healed after transmetatarsal amputation. She is going to start physical therapy soon for gait There has been no recurrent cardiovascular events no palpitation edema shortness of breath chest pain or syncope.  Recent lipid profile nonfasting shows LDL at target and triglycerides of 371 mildly elevated in the setting of A1c of 8.9% She tolerates multidrug regimen for lipid disorder including PCSK9 inhibitor Repatha high intensity statin and fenofibric. She continues on dual antiplatelet therapy. Transesophageal echocardiogram December 2022 normal ejection fraction Past Medical History:  Diagnosis Date   Abnormal EKG 10/23/2021   Abnormal mammogram of left breast 09/04/2021   Abnormal stress test 10/23/2021   Acute chest pain 05/08/2016   AICD (automatic cardioverter/defibrillator) present  St Jude/Abbott device   AKI (acute kidney injury) ([place]) 10/23/2021   Angina pectoris  ([place]) 09/11/2017   Anxiety 10/23/2021   Bacteremia 11/22/2021   Breast wound, right, subsequent encounter 09/04/2021   Burping 05/08/2016   Cancer ([place])    cervical - hysterectomy   Cellulitis of right breast 10/23/2021   Cellulitis, wound, post-operative 10/09/2021   Chronic diastolic heart failure ([place]) 11/06/2016   Coronary artery disease involving native coronary artery of native heart with angina pectoris ([place]) 09/12/2015   Overview:  PCI and stent of RCA 2009, last cath 2012 with medical therapy  CABG May 2017   Demand ischemia [facility])    Diabetic foot infection ([place]) 11/21/2021   Diabetic foot ulcer ([place]) 02/16/2020   Emphysema lung ([place]) 09/11/2017   patient not aware of this dx   Essential hypertension 09/12/2015   Gangrene ([place]) 10/23/2021   GERD (gastroesophageal reflux disease) 05/08/2016   Hematoma 10/23/2021   Hiatal hernia 10/23/2021   Hyperglycemia 10/23/2021   Hyperglycemia due to type 2 diabetes mellitus ([place]) 10/23/2021   Hyperlipidemia 09/12/2015   Hyponatremia 10/23/2021   ICD (implantable cardioverter-defibrillator) in place 05/16/2021   Infection of great toe 10/23/2021   Lactic acidosis 10/23/2021   Leukocytosis 10/23/2021   Mediastinitis 06/28/2016   Morbid obesity ([place]) 05/08/2016   Myocardial infarction [facility])    NSTEMI (non-ST elevated myocardial infarction) ([place]) 05/22/2020   Obesity (BMI 30-39.9) 10/23/2021   Perineal abscess 10/23/2021   Peripheral vascular disease ([place])    S/P CABG (coronary artery bypass graft) 06/03/2016   Overview:  The patient underwent sternal reconstruction on 06/21/16 with pec flaps for mediastinitis from a prior CABG in May 2017. On admission, she was critically ill from sepsis and had altered mental status. She was last seen in clinic on 08/09/16 at which time she was doing well.   Severe sepsis ([place]) 06/03/2016   Sinus tachycardia 05/08/2016   Tobacco use disorder 04/20/2016   Overview:  Quit in May 2017.   Type 2 diabetes mellitus with foot  ulcer ([place]) 05/08/2016   Unstable angina ([place]) 05/22/2020   Ventricular tachycardia ([place]) 12/17/2020   Wound, surgical, infected 06/06/2016   Overview:  sternal    Past Surgical History:  Procedure Laterality Date   ABDOMINAL AORTOGRAM W/LOWER EXTREMITY N/A 10/11/2021   Procedure: ABDOMINAL AORTOGRAM W/LOWER EXTREMITY;  Surgeon: Marty Heck, MD;  Location: [place] [facility];  Service: Cardiovascular;  Laterality: N/A;   AMPUTATION TOE Right 02/20/2020   Procedure: AMPUTATION RIGHT GREAT  TOE;  Surgeon: Felipa Furnace, DPM;  Location: [place];  Service: Podiatry;  Laterality: Right;   BLADDER SURGERY     BONE BIOPSY Left 02/01/2022   Procedure: BONE BIOPSY LEFT FOOT, IRRIGATION AND DEBRIDEMENT;  Surgeon: Landis Martins, DPM;  Location: [place];  Service: Podiatry;  Laterality: Left;   BUBBLE STUDY  11/27/2021   Procedure: BUBBLE STUDY;  Surgeon: Fay Records, MD;  Location: [place];  Service: Cardiovascular;;   CARDIAC CATHETERIZATION     CORONARY ARTERY BYPASS GRAFT     CORONARY STENT INTERVENTION N/A 05/25/2020   Procedure: CORONARY STENT INTERVENTION;  Surgeon: Wellington Hampshire, MD;  Location: [place] [facility];  Service: Cardiovascular;  Laterality: N/A;   CORONARY STENT INTERVENTION N/A 06/22/2020   Procedure: CORONARY STENT INTERVENTION;  Surgeon: Leonie Man, MD;  Location: [place] [facility];  Service: Cardiovascular;  Laterality: N/A;   ICD IMPLANT N/A 12/19/2020   Procedure: ICD IMPLANT;  Surgeon: Lovena Le,  Champ Mungo, MD;  Location: [place] [facility];  Service: Cardiovascular;  Laterality: N/A;   INCISION AND DRAINAGE Right 02/19/2020   Procedure: INCISION AND DRAINAGE;  Surgeon: Trula Slade, DPM;  Location: [place];  Service: Podiatry;  Laterality: Right;  Block done by surgeon   INTRAVASCULAR ULTRASOUND/IVUS N/A 06/22/2020   Procedure: Intravascular Ultrasound/IVUS;  Surgeon: Leonie Man, MD;  Location: [place] [facility];  Service: Cardiovascular;  Laterality: N/A;    IRRIGATION AND DEBRIDEMENT FOOT Left 10/10/2021   Procedure: IRRIGATION AND DEBRIDEMENT FOOT;  Surgeon: Landis Martins, DPM;  Location: [place];  Service: Podiatry;  Laterality: Left;   IRRIGATION AND DEBRIDEMENT FOOT Left 11/23/2021   Procedure: IRRIGATION AND DEBRIDEMENT FOOT;  Surgeon: Lorenda Peck, MD;  Location: [place];  Service: Podiatry;  Laterality: Left;  will do local block   LEFT HEART CATH AND CORS/GRAFTS ANGIOGRAPHY N/A 05/23/2020   Procedure: LEFT HEART CATH AND CORS/GRAFTS ANGIOGRAPHY;  Surgeon: Nelva Bush, MD;  Location: [place] [facility];  Service: Cardiovascular;  Laterality: N/A;   LEFT HEART CATH AND CORS/GRAFTS ANGIOGRAPHY N/A 12/19/2020   Procedure: LEFT HEART CATH AND CORS/GRAFTS ANGIOGRAPHY;  Surgeon: Burnell Blanks, MD;  Location: [place] [facility];  Service: Cardiovascular;  Laterality: N/A;   LEFT HEART CATH AND CORS/GRAFTS ANGIOGRAPHY N/A 07/21/2021   Procedure: LEFT HEART CATH AND CORS/GRAFTS ANGIOGRAPHY;  Surgeon: Nelva Bush, MD;  Location: [place] [facility];  Service: Cardiovascular;  Laterality: N/A;   METATARSAL HEAD EXCISION Right 05/02/2020   Procedure: FIRST METATARSAL HEAD RESECTION; RIGHT FOOT WOUND CLOSURE;  Surgeon: Landis Martins, DPM;  Location: [place];  Service: Podiatry;  Laterality: Right;  MAC W/LOCAL   PERIPHERAL VASCULAR BALLOON ANGIOPLASTY Left 10/11/2021   Procedure: PERIPHERAL VASCULAR BALLOON ANGIOPLASTY;  Surgeon: Marty Heck, MD;  Location: [place] [facility];  Service: Cardiovascular;  Laterality: Left;  Anterior Tibial Artery   TEE WITHOUT CARDIOVERSION N/A 11/27/2021   Procedure: TRANSESOPHAGEAL ECHOCARDIOGRAM (TEE);  Surgeon: Fay Records, MD;  [facility];  Service: Cardiovascular;  Laterality: N/A;   TRANSMETATARSAL AMPUTATION Left 11/23/2021   Procedure: TRANSMETATARSAL AMPUTATION;  Surgeon: Lorenda Peck, MD;  Location: [place];  Service: Podiatry;  Laterality: Left;   TUBAL  LIGATION      Current Medications: Current Meds  Medication Sig   acetaminophen (TYLENOL) 325 MG tablet Take 2 tablets (650 mg total) by mouth every 4 (four) hours as needed for headache or mild pain.   amiodarone (PACERONE) 200 MG tablet Take 1 tablet (200 mg total) by mouth daily.   ascorbic acid (VITAMIN C) 250 MG tablet Take by mouth.   aspirin 81 MG chewable tablet Chew 1 tablet by mouth daily.   clopidogrel (PLAVIX) 75 MG tablet Take 1 tablet (75 mg total) by mouth daily.   dapagliflozin propanediol (FARXIGA) 10 MG TABS tablet Take 1 tablet (10 mg total) by mouth daily.   docusate sodium (COLACE) 100 MG capsule Take 1 capsule (100 mg total) by mouth 2 (two) times daily.   Evolocumab (REPATHA SURECLICK) 409 MG/ML SOAJ Inject 140 mg into the skin every 14 (fourteen) days.   fenofibrate (TRICOR) 145 MG tablet Take 1 tablet by mouth once daily   furosemide (LASIX) 20 MG tablet Take 1 tablet (20 mg total) by mouth daily.   HUMALOG KWIKPEN 100 UNIT/ML KwikPen Inject 10-50 Units into the skin at bedtime. Sliding scale   insulin degludec (TRESIBA FLEXTOUCH) 100 UNIT/ML FlexTouch Pen Inject 40 Units into  the skin daily.   isosorbide mononitrate (IMDUR) 30 MG 24 hr tablet Take 1 tablet (30 mg total) by mouth daily.   LORazepam (ATIVAN) 1 MG tablet Take 1 tablet (1 mg total) by mouth as needed for anxiety.   metFORMIN (GLUCOPHAGE) 500 MG tablet Take 1 tablet (500 mg total) by mouth 2 (two) times daily with a meal.   metoprolol succinate (TOPROL-XL) 25 MG 24 hr tablet Take 1 tablet (25 mg total) by mouth in the morning, at noon, and at bedtime.   nitroGLYCERIN (NITROSTAT) 0.4 MG SL tablet Place 1 tablet (0.4 mg total) under the tongue every 5 (five) minutes as needed for chest pain.   nystatin (MYCOSTATIN/NYSTOP) powder Apply 1 application topically daily as needed (rash/yeast).   omeprazole (PRILOSEC) 20 MG capsule Take 1 capsule (20 mg total) by mouth daily.   rosuvastatin (CRESTOR) 20 MG tablet  Take 1 tablet (20 mg total) by mouth daily.   senna-docusate (SENOKOT-S) 8.6-50 MG tablet Take 1 tablet by mouth daily.   valsartan (DIOVAN) 80 MG tablet Take 1 tablet (80 mg total) by mouth 2 (two) times daily.   vitamin B-12 (CYANOCOBALAMIN) 1000 MCG tablet Take 1,000 mcg by mouth daily.   vitamin C (ASCORBIC ACID) 500 MG tablet Take 500 mg by mouth daily.   Vitamin D, Ergocalciferol, (DRISDOL) 1.25 MG (50000 UNIT) CAPS capsule Take 1 capsule (50,000 Units total) by mouth every Saturday.     Allergies:   Vancomycin, Chlorhexidine gluconate, Cat hair extract, Tramadol, and Codeine   Social History   Socioeconomic History   Marital status: Married    Spouse name: Not on file   Number of children: Not on file   Years of education: Not on file   Highest education level: Not on file  Occupational History   Not on file  Tobacco Use   Smoking status: Former    Types: Cigarettes    Quit date: 05/2016    Years since quitting: 6.1   Smokeless tobacco: Never  Vaping Use   Vaping Use: Never used  Substance and Sexual Activity   Alcohol use: Yes    Comment: social wine   Drug use: No   Sexual activity: Yes    Birth control/protection: Surgical    Comment: Hysterectomy  Other Topics Concern   Not on file  Social History Narrative   Not on file   Social Determinants of Health   Financial Resource Strain: Not on file  Food Insecurity: Not on file  Transportation Needs: Not on file  Physical Activity: Not on file  Stress: Not on file  Social Connections: Not on file     Family History: The patient's family history includes Alzheimer's disease in her father; Heart attack in her father; Heart disease in her father; Hyperlipidemia in her brother and mother; Hypertension in her brother, father, and mother. ROS:   Please see the history of present illness.    All other systems reviewed and are negative.  EKGs/Labs/Other Studies Reviewed:    The following studies were reviewed  today:  EKG:  EKG ordered today and personally reviewed.  The ekg ordered today demonstrates sinus rhythm first-degree AV block right bundle branch block left posterior fascicular block old anterior MI.  Recent Labs: 07/20/2021: B Natriuretic Peptide 86.1 05/22/2022: ALT 23; BUN 24; Creatinine, Ser 1.13; Hemoglobin 11.5; Platelets 269; Potassium 5.2; Sodium 137; TSH 2.270  Recent Lipid Panel    Component Value Date/Time   CHOL 175 05/22/2022 1646   TRIG  371 (H) 05/22/2022 1646   HDL 53 05/22/2022 1646   CHOLHDL 3.3 05/22/2022 1646   CHOLHDL 2.7 07/20/2021 1232   VLDL 37 07/20/2021 1232   LDLCALC 65 05/22/2022 1646   LDLDIRECT 91.4 05/22/2020 2122    Physical Exam:    VS:  BP 120/80 (BP Location: Right Arm, Patient Position: Sitting, Cuff Size: Normal)   Pulse 77   Ht _0  (1.702 m)   Wt 226 lb (102.5 kg)   LMP  (LMP Unknown)   SpO2 95%   BMI 35.40 kg/m     Wt Readings from Last 3 Encounters:  07/05/22 226 lb (102.5 kg)  05/22/22 228 lb (103.4 kg)  05/08/22 223 lb (101.2 kg)     GEN:  Well nourished, well developed in no acute distress HEENT: Normal NECK: No JVD; No carotid bruits LYMPHATICS: No lymphadenopathy CARDIAC: RRR, no murmurs, rubs, gallops RESPIRATORY:  Clear to auscultation without rales, wheezing or rhonchi  ABDOMEN: Soft, non-tender, non-distended MUSCULOSKELETAL:  No edema; No deformity  SKIN: Warm and dry NEUROLOGIC:  Alert and oriented x 3 PSYCHIATRIC:  Normal affect    Signed, Shirlee More, MD  07/05/2022 4:36 PM    [facility]

## 2022-07-11 ENCOUNTER — Encounter: Payer: Self-pay | Admitting: Podiatry

## 2022-07-11 ENCOUNTER — Telehealth: Payer: Self-pay | Admitting: Podiatry

## 2022-07-11 ENCOUNTER — Ambulatory Visit (INDEPENDENT_AMBULATORY_CARE_PROVIDER_SITE_OTHER): Payer: Managed Care, Other (non HMO)

## 2022-07-11 DIAGNOSIS — Z89432 Acquired absence of left foot: Secondary | ICD-10-CM

## 2022-07-11 NOTE — Progress Notes (Signed)
Remote ICD transmission.   

## 2022-07-11 NOTE — Progress Notes (Unsigned)
Patient presents today to pick up custom molded foot orthotics, diagnosed with transmetatarsal amputation of left foot by Dr. Blenda Mounts.   Orthotics were dispensed and fit was satisfactory. Reviewed instructions for break-in and wear. Written instructions given to patient.  Left ONLY  Patient will follow up as needed.   [facility] - order # [id number]

## 2022-07-11 NOTE — Telephone Encounter (Signed)
Pt is wanting to get a referral to physical therapy and would like to go to [facility] at the hospital.

## 2022-07-23 ENCOUNTER — Telehealth: Payer: Self-pay | Admitting: Cardiology

## 2022-07-23 NOTE — Telephone Encounter (Signed)
Pt is calling stating she left forms there for Dr. [name] to sign for her handicap sign. Requesting call back to see if it has been signed so she knows when to come and get it from the office.

## 2022-07-26 NOTE — Telephone Encounter (Signed)
Dr. [name] has signed the form and it was dropped off at the [facility] office yesterday for the patient to pick - up.

## 2022-07-31 ENCOUNTER — Other Ambulatory Visit: Payer: Self-pay

## 2022-07-31 DIAGNOSIS — E0865 Diabetes mellitus due to underlying condition with hyperglycemia: Secondary | ICD-10-CM

## 2022-07-31 MED ORDER — METFORMIN HCL 500 MG PO TABS: 500.0000 mg | ORAL_TABLET | Freq: Two times a day (BID) | ORAL | 1 refills | Status: DC

## 2022-07-31 MED ORDER — VITAMIN D (ERGOCALCIFEROL) 1.25 MG (50000 UNIT) PO CAPS
50000.0000 [IU] | ORAL_CAPSULE | ORAL | 1 refills | Status: DC
Start: 2022-08-04 — End: 2023-03-07

## 2022-07-31 MED ORDER — DAPAGLIFLOZIN PROPANEDIOL 10 MG PO TABS: 10.0000 mg | ORAL_TABLET | Freq: Every day | ORAL | 1 refills | Status: DC

## 2022-07-31 MED ORDER — OMEPRAZOLE 20 MG PO CPDR: 20.0000 mg | DELAYED_RELEASE_CAPSULE | Freq: Every day | ORAL | 1 refills | Status: DC

## 2022-08-06 ENCOUNTER — Other Ambulatory Visit: Payer: Self-pay

## 2022-08-06 MED ORDER — VALSARTAN 80 MG PO TABS: 80.0000 mg | ORAL_TABLET | Freq: Two times a day (BID) | ORAL | 3 refills | Status: DC

## 2022-08-06 MED ORDER — ROSUVASTATIN CALCIUM 20 MG PO TABS: 20.0000 mg | ORAL_TABLET | Freq: Every day | ORAL | 3 refills | Status: DC

## 2022-08-06 MED ORDER — CLOPIDOGREL BISULFATE 75 MG PO TABS: 75.0000 mg | ORAL_TABLET | Freq: Every day | ORAL | 3 refills | Status: DC

## 2022-08-06 MED ORDER — METOPROLOL SUCCINATE ER 25 MG PO TB24: 25.0000 mg | ORAL_TABLET | Freq: Three times a day (TID) | ORAL | 3 refills | Status: DC

## 2022-08-06 MED ORDER — FUROSEMIDE 20 MG PO TABS: 20.0000 mg | ORAL_TABLET | Freq: Every day | ORAL | 3 refills | Status: AC

## 2022-08-06 MED ORDER — REPATHA SURECLICK 140 MG/ML ~~LOC~~ SOAJ: 140.0000 mg | SUBCUTANEOUS | 6 refills | Status: DC

## 2022-08-06 MED ORDER — FENOFIBRATE 145 MG PO TABS: 145.0000 mg | ORAL_TABLET | Freq: Every day | ORAL | 3 refills | Status: DC

## 2022-08-06 MED ORDER — AMIODARONE HCL 200 MG PO TABS: 200.0000 mg | ORAL_TABLET | Freq: Every day | ORAL | 3 refills | Status: DC

## 2022-08-06 NOTE — Telephone Encounter (Signed)
Amiodarone 200 mg # 90 x 3 refills Furosemide 20 mg # 90 x 3 refills Repatha Sureclick 685 mg/ml x 6 refills Metoprolol Succinate 25 mg # 270 x 3 refills Valsartan 80 mg # 180 x 3 refills Rosuvastatin 20 mg # 90 x 3 refills  Clopidogrel 75 mg # 90 x 3 refill Sent to [facility]

## 2022-08-06 NOTE — Telephone Encounter (Signed)
Fenofibrate 145 mg # 90 tablets x 3 refills sent to [facility]

## 2022-09-07 ENCOUNTER — Ambulatory Visit: Payer: Managed Care, Other (non HMO) | Admitting: Physician Assistant

## 2022-09-17 ENCOUNTER — Ambulatory Visit: Payer: BC Managed Care – PPO | Admitting: Podiatry

## 2022-09-17 DIAGNOSIS — L97522 Non-pressure chronic ulcer of other part of left foot with fat layer exposed: Secondary | ICD-10-CM | POA: Diagnosis not present

## 2022-09-17 DIAGNOSIS — E0843 Diabetes mellitus due to underlying condition with diabetic autonomic (poly)neuropathy: Secondary | ICD-10-CM | POA: Diagnosis not present

## 2022-09-17 MED ORDER — GENTAMICIN SULFATE 0.1 % EX CREA: 1.0000 | TOPICAL_CREAM | Freq: Two times a day (BID) | CUTANEOUS | 1 refills | Status: DC

## 2022-09-17 NOTE — Progress Notes (Signed)
Chief Complaint  Patient presents with   left foot blister    Patient is here for left foot blister that has busted and the patient is diabetic.    Subjective:  61 y.o. female with PMHx of diabetes mellitus PSxHx TMA LT foot presenting for evaluation of a new ulcer that developed to the plantar aspect of the fifth metatarsal left foot.  Patient states that she only recently noticed it a few weeks ago.  She does wear diabetic shoes but admits to walking around the house barefoot.  She presents for further treatment evaluation   Past Medical History:  Diagnosis Date   Abnormal EKG 10/23/2021   Abnormal mammogram of left breast 09/04/2021   Abnormal stress test 10/23/2021   Acute chest pain 05/08/2016   AICD (automatic cardioverter/defibrillator) present    St Jude/Abbott device   AKI (acute kidney injury) ([place]) 10/23/2021   Angina pectoris ([place]) 09/11/2017   Anxiety 10/23/2021   Bacteremia 11/22/2021   Breast wound, right, subsequent encounter 09/04/2021   Burping 05/08/2016   Cancer ([place])    cervical - hysterectomy   Cellulitis of right breast 10/23/2021   Cellulitis, wound, post-operative 10/09/2021   Chronic diastolic heart failure ([place]) 11/06/2016   Coronary artery disease involving native coronary artery of native heart with angina pectoris ([place]) 09/12/2015   Overview:  PCI and stent of RCA 2009, last cath 2012 with medical therapy  CABG May 2017   Demand ischemia [facility])    Diabetic foot infection ([place]) 11/21/2021   Diabetic foot ulcer ([place]) 02/16/2020   Emphysema lung ([place]) 09/11/2017   patient not aware of this dx   Essential hypertension 09/12/2015   Gangrene ([place]) 10/23/2021   GERD (gastroesophageal reflux disease) 05/08/2016   Hematoma 10/23/2021   Hiatal hernia 10/23/2021   Hyperglycemia 10/23/2021   Hyperglycemia due to type 2 diabetes mellitus ([place]) 10/23/2021   Hyperlipidemia 09/12/2015   Hyponatremia 10/23/2021   ICD (implantable cardioverter-defibrillator) in place  05/16/2021   Infection of great toe 10/23/2021   Lactic acidosis 10/23/2021   Leukocytosis 10/23/2021   Mediastinitis 06/28/2016   Morbid obesity ([place]) 05/08/2016   Myocardial infarction [facility])    NSTEMI (non-ST elevated myocardial infarction) ([place]) 05/22/2020   Obesity (BMI 30-39.9) 10/23/2021   Perineal abscess 10/23/2021   Peripheral vascular disease ([place])    S/P CABG (coronary artery bypass graft) 06/03/2016   Overview:  The patient underwent sternal reconstruction on 06/21/16 with pec flaps for mediastinitis from a prior CABG in May 2017. On admission, she was critically ill from sepsis and had altered mental status. She was last seen in clinic on 08/09/16 at which time she was doing well.   Severe sepsis ([place]) 06/03/2016   Sinus tachycardia 05/08/2016   Tobacco use disorder 04/20/2016   Overview:  Quit in May 2017.   Type 2 diabetes mellitus with foot ulcer ([place]) 05/08/2016   Unstable angina ([place]) 05/22/2020   Ventricular tachycardia ([place]) 12/17/2020   Wound, surgical, infected 06/06/2016   Overview:  sternal    Past Surgical History:  Procedure Laterality Date   ABDOMINAL AORTOGRAM W/LOWER EXTREMITY N/A 10/11/2021   Procedure: ABDOMINAL AORTOGRAM W/LOWER EXTREMITY;  Surgeon: Marty Heck, MD;  Location: [place] [facility];  Service: Cardiovascular;  Laterality: N/A;   AMPUTATION TOE Right 02/20/2020   Procedure: AMPUTATION RIGHT GREAT  TOE;  Surgeon: Felipa Furnace, DPM;  Location: [place];  Service: Podiatry;  Laterality: Right;   BLADDER SURGERY     BONE BIOPSY  Left 02/01/2022   Procedure: BONE BIOPSY LEFT FOOT, IRRIGATION AND DEBRIDEMENT;  Surgeon: Landis Martins, DPM;  Location: [place];  Service: Podiatry;  Laterality: Left;   BUBBLE STUDY  11/27/2021   Procedure: BUBBLE STUDY;  Surgeon: Fay Records, MD;  Location: [place];  Service: Cardiovascular;;   CARDIAC CATHETERIZATION     CORONARY ARTERY BYPASS GRAFT     CORONARY STENT INTERVENTION N/A 05/25/2020   Procedure: CORONARY  STENT INTERVENTION;  Surgeon: Wellington Hampshire, MD;  Location: [place] [facility];  Service: Cardiovascular;  Laterality: N/A;   CORONARY STENT INTERVENTION N/A 06/22/2020   Procedure: CORONARY STENT INTERVENTION;  Surgeon: Leonie Man, MD;  Location: [place] [facility];  Service: Cardiovascular;  Laterality: N/A;   ICD IMPLANT N/A 12/19/2020   Procedure: ICD IMPLANT;  Surgeon: Theo Reither Lance, MD;  Location: [place] [facility];  Service: Cardiovascular;  Laterality: N/A;   INCISION AND DRAINAGE Right 02/19/2020   Procedure: INCISION AND DRAINAGE;  Surgeon: Trula Slade, DPM;  Location: [place];  Service: Podiatry;  Laterality: Right;  Block done by surgeon   INTRAVASCULAR ULTRASOUND/IVUS N/A 06/22/2020   Procedure: Intravascular Ultrasound/IVUS;  Surgeon: Leonie Man, MD;  Location: [place] [facility];  Service: Cardiovascular;  Laterality: N/A;   IRRIGATION AND DEBRIDEMENT FOOT Left 10/10/2021   Procedure: IRRIGATION AND DEBRIDEMENT FOOT;  Surgeon: Landis Martins, DPM;  Location: [place];  Service: Podiatry;  Laterality: Left;   IRRIGATION AND DEBRIDEMENT FOOT Left 11/23/2021   Procedure: IRRIGATION AND DEBRIDEMENT FOOT;  Surgeon: Lorenda Peck, MD;  Location: [place];  Service: Podiatry;  Laterality: Left;  will do local block   LEFT HEART CATH AND CORS/GRAFTS ANGIOGRAPHY N/A 05/23/2020   Procedure: LEFT HEART CATH AND CORS/GRAFTS ANGIOGRAPHY;  Surgeon: Nelva Bush, MD;  Location: [place] [facility];  Service: Cardiovascular;  Laterality: N/A;   LEFT HEART CATH AND CORS/GRAFTS ANGIOGRAPHY N/A 12/19/2020   Procedure: LEFT HEART CATH AND CORS/GRAFTS ANGIOGRAPHY;  Surgeon: Burnell Blanks, MD;  Location: [place] [facility];  Service: Cardiovascular;  Laterality: N/A;   LEFT HEART CATH AND CORS/GRAFTS ANGIOGRAPHY N/A 07/21/2021   Procedure: LEFT HEART CATH AND CORS/GRAFTS ANGIOGRAPHY;  Surgeon: Nelva Bush, MD;  Location: [place] [facility];  Service: Cardiovascular;  Laterality:  N/A;   METATARSAL HEAD EXCISION Right 05/02/2020   Procedure: FIRST METATARSAL HEAD RESECTION; RIGHT FOOT WOUND CLOSURE;  Surgeon: Landis Martins, DPM;  Location: [place];  Service: Podiatry;  Laterality: Right;  MAC W/LOCAL   PERIPHERAL VASCULAR BALLOON ANGIOPLASTY Left 10/11/2021   Procedure: PERIPHERAL VASCULAR BALLOON ANGIOPLASTY;  Surgeon: Marty Heck, MD;  Location: [place] [facility];  Service: Cardiovascular;  Laterality: Left;  Anterior Tibial Artery   TEE WITHOUT CARDIOVERSION N/A 11/27/2021   Procedure: TRANSESOPHAGEAL ECHOCARDIOGRAM (TEE);  Surgeon: Fay Records, MD;  Location: [facility];  Service: Cardiovascular;  Laterality: N/A;   TRANSMETATARSAL AMPUTATION Left 11/23/2021   Procedure: TRANSMETATARSAL AMPUTATION;  Surgeon: Lorenda Peck, MD;  Location: [place];  Service: Podiatry;  Laterality: Left;   TUBAL LIGATION      Allergies  Allergen Reactions   Vancomycin Anaphylaxis   Chlorhexidine Gluconate Itching    Received CHG bath, began itching, required benadryl   Cat Hair Extract Other (See Comments)    Sneezing, watery eyes.   Tramadol Other (See Comments)    Hallucinations   Codeine Rash      Objective/Physical Exam General: The patient is alert and oriented x3  in no acute distress.  Dermatology:  Wound #1 noted to the to the plantar lateral aspect of the left foot measuring approximate 1.2 x 1.2 x 0.2 cm (LxWxD).  Please see above noted photo  To the noted ulceration(s), there is no eschar. There is minimal slough, fibrin, and necrotic tissue noted. Granulation tissue and wound base is red.  Negative for any appreciable drainage.  There is no exposed bone muscle-tendon ligament or joint. There is no malodor. Periwound integrity is intact.  Overall the wound appears very stable Skin is warm, dry and supple bilateral lower extremities.  Vascular: Palpable pedal pulses bilaterally. No edema or erythema noted. Capillary refill within  normal limits.  Neurological: Epicritic and protective threshold diminished bilaterally.   Musculoskeletal Exam: PSxHx transmetatarsal amputation LT; healed.  Patient also has a very prominent forefoot varus deformity which increases lateral column loading.  This is likely the reason for the ulcer to the lateral aspect of the foot.  Assessment: 1.  Ulcer left foot secondary to diabetes mellitus 2. diabetes mellitus w/ peripheral neuropathy 3.  History of transmetatarsal amputation left   Plan of Care:  1. Patient was evaluated. 2. medically necessary excisional debridement including subcutaneous tissue was performed using a tissue nipper and a chisel blade. Excisional debridement of all the necrotic nonviable tissue down to healthy bleeding viable tissue was performed with post-debridement measurements same as pre-. 3. the wound was cleansed and dry sterile dressing applied. 4.  Prescription for gentamicin cream apply daily  5.  Offloading felt metatarsal pads were applied to the plantar aspect of the left foot to offload pressure from the lateral column  6.  Patient is to return to clinic in 2 weeks.  *Niece, Charity, with her at today's visit   Edrick Kins, DPM [facility]  Dr. Edrick Kins, DPM    [address], [place] [address]                Office [phone]  Fax [phone]

## 2022-09-18 ENCOUNTER — Ambulatory Visit (INDEPENDENT_AMBULATORY_CARE_PROVIDER_SITE_OTHER): Payer: Managed Care, Other (non HMO)

## 2022-09-18 DIAGNOSIS — Z95810 Presence of automatic (implantable) cardiac defibrillator: Secondary | ICD-10-CM

## 2022-09-18 LAB — CUP PACEART REMOTE DEVICE CHECK
Battery Remaining Longevity: 93 mo
Battery Remaining Percentage: 83 %
Battery Voltage: 3.01 V
Brady Statistic AP VP Percent: 1 %
Brady Statistic AP VS Percent: 1.5 %
Brady Statistic AS VP Percent: 1 %
Brady Statistic AS VS Percent: 98 %
Brady Statistic RA Percent Paced: 1.5 %
Brady Statistic RV Percent Paced: 1 %
Date Time Interrogation Session: 20231003020813
HighPow Impedance: 68 Ohm
Implantable Lead Implant Date: 20220103
Implantable Lead Implant Date: 20220103
Implantable Lead Location: 753859
Implantable Lead Location: 753860
Implantable Pulse Generator Implant Date: 20220103
Lead Channel Impedance Value: 330 Ohm
Lead Channel Impedance Value: 390 Ohm
Lead Channel Pacing Threshold Amplitude: 0.75 V
Lead Channel Pacing Threshold Amplitude: 1 V
Lead Channel Pacing Threshold Pulse Width: 0.5 ms
Lead Channel Pacing Threshold Pulse Width: 0.5 ms
Lead Channel Sensing Intrinsic Amplitude: 12 mV
Lead Channel Sensing Intrinsic Amplitude: 3.8 mV
Lead Channel Setting Pacing Amplitude: 2 V
Lead Channel Setting Pacing Amplitude: 2.5 V
Lead Channel Setting Pacing Pulse Width: 0.5 ms
Lead Channel Setting Sensing Sensitivity: 0.5 mV
Pulse Gen Serial Number: 111037319

## 2022-09-20 ENCOUNTER — Encounter: Payer: Self-pay | Admitting: Physician Assistant

## 2022-09-20 ENCOUNTER — Ambulatory Visit: Payer: BC Managed Care – PPO | Admitting: Physician Assistant

## 2022-09-20 VITALS — BP 122/80 | HR 69 | Temp 96.9°F | Ht 67.0 in | Wt 228.0 lb

## 2022-09-20 DIAGNOSIS — L304 Erythema intertrigo: Secondary | ICD-10-CM

## 2022-09-20 DIAGNOSIS — Z1211 Encounter for screening for malignant neoplasm of colon: Secondary | ICD-10-CM

## 2022-09-20 DIAGNOSIS — F419 Anxiety disorder, unspecified: Secondary | ICD-10-CM

## 2022-09-20 DIAGNOSIS — Z794 Long term (current) use of insulin: Secondary | ICD-10-CM

## 2022-09-20 DIAGNOSIS — I739 Peripheral vascular disease, unspecified: Secondary | ICD-10-CM | POA: Diagnosis not present

## 2022-09-20 DIAGNOSIS — E538 Deficiency of other specified B group vitamins: Secondary | ICD-10-CM

## 2022-09-20 DIAGNOSIS — K219 Gastro-esophageal reflux disease without esophagitis: Secondary | ICD-10-CM

## 2022-09-20 DIAGNOSIS — E559 Vitamin D deficiency, unspecified: Secondary | ICD-10-CM

## 2022-09-20 DIAGNOSIS — Z95810 Presence of automatic (implantable) cardiac defibrillator: Secondary | ICD-10-CM

## 2022-09-20 DIAGNOSIS — S98111A Complete traumatic amputation of right great toe, initial encounter: Secondary | ICD-10-CM

## 2022-09-20 DIAGNOSIS — E782 Mixed hyperlipidemia: Secondary | ICD-10-CM

## 2022-09-20 DIAGNOSIS — I5032 Chronic diastolic (congestive) heart failure: Secondary | ICD-10-CM

## 2022-09-20 DIAGNOSIS — Z89432 Acquired absence of left foot: Secondary | ICD-10-CM

## 2022-09-20 DIAGNOSIS — I10 Essential (primary) hypertension: Secondary | ICD-10-CM

## 2022-09-20 DIAGNOSIS — E0865 Diabetes mellitus due to underlying condition with hyperglycemia: Secondary | ICD-10-CM | POA: Diagnosis not present

## 2022-09-20 DIAGNOSIS — Z23 Encounter for immunization: Secondary | ICD-10-CM

## 2022-09-20 DIAGNOSIS — L97421 Non-pressure chronic ulcer of left heel and midfoot limited to breakdown of skin: Secondary | ICD-10-CM

## 2022-09-20 DIAGNOSIS — E11621 Type 2 diabetes mellitus with foot ulcer: Secondary | ICD-10-CM

## 2022-09-20 DIAGNOSIS — D508 Other iron deficiency anemias: Secondary | ICD-10-CM | POA: Diagnosis not present

## 2022-09-20 DIAGNOSIS — D649 Anemia, unspecified: Secondary | ICD-10-CM | POA: Insufficient documentation

## 2022-09-20 HISTORY — DX: Diabetes mellitus due to underlying condition with hyperglycemia: E08.65

## 2022-09-20 HISTORY — DX: Deficiency of other specified B group vitamins: E53.8

## 2022-09-20 HISTORY — DX: Acquired absence of left foot: Z89.432

## 2022-09-20 HISTORY — DX: Anemia, unspecified: D64.9

## 2022-09-20 HISTORY — DX: Encounter for screening for malignant neoplasm of colon: Z12.11

## 2022-09-20 HISTORY — DX: Erythema intertrigo: L30.4

## 2022-09-20 HISTORY — DX: Vitamin D deficiency, unspecified: E55.9

## 2022-09-20 HISTORY — DX: Complete traumatic amputation of right great toe, initial encounter: S98.111A

## 2022-09-20 HISTORY — DX: Encounter for immunization: Z23

## 2022-09-20 MED ORDER — NYSTATIN 100000 UNIT/GM EX POWD: 1.0000 | Freq: Every day | CUTANEOUS | 2 refills | Status: DC | PRN

## 2022-09-20 MED ORDER — OMEPRAZOLE 20 MG PO CPDR: 20.0000 mg | DELAYED_RELEASE_CAPSULE | Freq: Every day | ORAL | 1 refills | Status: DC

## 2022-09-20 MED ORDER — TRESIBA FLEXTOUCH 100 UNIT/ML ~~LOC~~ SOPN: 50.0000 [IU] | PEN_INJECTOR | Freq: Every day | SUBCUTANEOUS | 3 refills | Status: DC

## 2022-09-20 MED ORDER — NITROGLYCERIN 0.4 MG SL SUBL: 0.4000 mg | SUBLINGUAL_TABLET | SUBLINGUAL | 6 refills | Status: DC | PRN

## 2022-09-20 NOTE — Progress Notes (Signed)
Subjective:  [name], female    DOB: February 06, 1961  Age: 61 y.o. MRN: [id number]  Chief Complaint  Patient presents with   Diabetes    Diabetes    Jasmine Richardson has a history of type 2 Diabetes Mellitus for more than 20 years. Current treatment includes tresiba, humalog sliding scale, farxiga and glucophage   She denies hypoglycemic episodes. She states blood glucose levels at home range from 140 to 150 fasting. She  is consuming a heart healthy diet a She performs diabetic foot checks daily after showering. She is following with [facility] every 2 weeks for foot care and currently has pressure ulcer on bottom of left foot  Pt with history of hyperlipidemia - currently also follows with cardiology.  Treatment includes repatha, tricor, and crestor 56m qd - she is due for labwork  Pt with history of vit D disorder - taking otc supplement and due for labwork  Pt with history of vit B12 deficiency - is currently on otc supplement - due to check labwork  Pt with history of GERD - stable on omeprazole 266mqd - requests refill  Pt requests refill of nystatin powder which she uses for chronic intertrigo symptoms  Pt follows with cardiology on a regular basis.  She sees Dr Jasmine Gaviaor history of CAD, chronic diastolic heart failure,and history of vtach.  She is currently on amiodarone therapy and has implantable defibrillator (which is monitored regularly).  She is also on  metoprolol, lasix, plavix, and imdur  Pt with history of anxiety - stable on lorazepam 50m59ms needed  Pt requests flu shot today  Pt is due for screening colonoscopy and is agreeable to schedule with Dr MisLyda Jesterb Results  Component Value Date   HGBA1C 8.9 (H) 05/22/2022   HGBA1C 10.7 (H) 11/23/2021   HGBA1C 12.8 (H) 10/10/2021    Current Outpatient Medications on File Prior to Visit  Medication Sig Dispense Refill   acetaminophen (TYLENOL) 325 MG tablet Take 2 tablets (650 mg total) by mouth  every 4 (four) hours as needed for headache or mild pain.     amiodarone (PACERONE) 200 MG tablet Take 1 tablet (200 mg total) by mouth daily. 90 tablet 3   ascorbic acid (VITAMIN C) 250 MG tablet Take by mouth.     aspirin 81 MG chewable tablet Chew 1 tablet by mouth daily.     clopidogrel (PLAVIX) 75 MG tablet Take 1 tablet (75 mg total) by mouth daily. 90 tablet 3   dapagliflozin propanediol (FARXIGA) 10 MG TABS tablet Take 1 tablet (10 mg total) by mouth daily. 90 tablet 1   Evolocumab (REPATHA SURECLICK) 140101/ML SOAJ Inject 140 mg into the skin every 14 (fourteen) days. 2 mL 6   fenofibrate (TRICOR) 145 MG tablet Take 1 tablet (145 mg total) by mouth daily. 90 tablet 3   furosemide (LASIX) 20 MG tablet Take 1 tablet (20 mg total) by mouth daily. 90 tablet 3   gentamicin cream (GARAMYCIN) 0.1 % Apply 1 Application topically 2 (two) times daily. 30 g 1   HUMALOG KWIKPEN 100 UNIT/ML KwikPen Inject 10-50 Units into the skin at bedtime. Sliding scale 15 mL 12   isosorbide mononitrate (IMDUR) 30 MG 24 hr tablet Take 1 tablet (30 mg total) by mouth daily. 90 tablet 3   LORazepam (ATIVAN) 1 MG tablet Take 1 tablet (1 mg total) by mouth as needed for anxiety. 30 tablet 0   metFORMIN (GLUCOPHAGE) 500 MG tablet  Take 1 tablet (500 mg total) by mouth 2 (two) times daily with a meal. 180 tablet 1   metoprolol succinate (TOPROL-XL) 25 MG 24 hr tablet Take 1 tablet (25 mg total) by mouth in the morning, at noon, and at bedtime. 270 tablet 3   Omega-3 Fatty Acids (FISH OIL) 1000 MG CAPS Take by mouth.     promethazine (PHENERGAN) 25 MG tablet Take 25 mg by mouth every 6 (six) hours as needed for nausea or vomiting.     rosuvastatin (CRESTOR) 20 MG tablet Take 1 tablet (20 mg total) by mouth daily. 90 tablet 3   senna-docusate (SENOKOT-S) 8.6-50 MG tablet Take 1 tablet by mouth daily.     valsartan (DIOVAN) 80 MG tablet Take 1 tablet (80 mg total) by mouth 2 (two) times daily. 180 tablet 3   vitamin B-12  (CYANOCOBALAMIN) 1000 MCG tablet Take 1,000 mcg by mouth daily.     vitamin C (ASCORBIC ACID) 500 MG tablet Take 500 mg by mouth daily.     Vitamin D, Ergocalciferol, (DRISDOL) 1.25 MG (50000 UNIT) CAPS capsule Take 1 capsule (50,000 Units total) by mouth every Saturday. 12 capsule 1   No current facility-administered medications on file prior to visit.   Past Medical History:  Diagnosis Date   Abnormal EKG 10/23/2021   Abnormal mammogram of left breast 09/04/2021   Abnormal stress test 10/23/2021   Acute chest pain 05/08/2016   AICD (automatic cardioverter/defibrillator) present    St Jude/Abbott device   AKI (acute kidney injury) ([place]) 10/23/2021   Angina pectoris ([place]) 09/11/2017   Anxiety 10/23/2021   Bacteremia 11/22/2021   Breast wound, right, subsequent encounter 09/04/2021   Burping 05/08/2016   Cancer ([place])    cervical - hysterectomy   Cellulitis of right breast 10/23/2021   Cellulitis, wound, post-operative 10/09/2021   Chronic diastolic heart failure ([place]) 11/06/2016   Coronary artery disease involving native coronary artery of native heart with angina pectoris ([place]) 09/12/2015   Overview:  PCI and stent of RCA 2009, last cath 2012 with medical therapy  CABG May 2017   Demand ischemia    Diabetic foot infection ([place]) 11/21/2021   Diabetic foot ulcer ([place]) 02/16/2020   Emphysema lung ([place]) 09/11/2017   patient not aware of this dx   Essential hypertension 09/12/2015   Gangrene ([place]) 10/23/2021   GERD (gastroesophageal reflux disease) 05/08/2016   Hematoma 10/23/2021   Hiatal hernia 10/23/2021   Hyperglycemia 10/23/2021   Hyperglycemia due to type 2 diabetes mellitus ([place]) 10/23/2021   Hyperlipidemia 09/12/2015   Hyponatremia 10/23/2021   ICD (implantable cardioverter-defibrillator) in place 05/16/2021   Infection of great toe 10/23/2021   Lactic acidosis 10/23/2021   Leukocytosis 10/23/2021   Mediastinitis 06/28/2016   Morbid obesity ([place]) 05/08/2016   Myocardial infarction [facility])     NSTEMI (non-ST elevated myocardial infarction) ([place]) 05/22/2020   Obesity (BMI 30-39.9) 10/23/2021   Perineal abscess 10/23/2021   Peripheral vascular disease ([place])    S/P CABG (coronary artery bypass graft) 06/03/2016   Overview:  The patient underwent sternal reconstruction on 06/21/16 with pec flaps for mediastinitis from a prior CABG in May 2017. On admission, she was critically ill from sepsis and had altered mental status. She was last seen in clinic on 08/09/16 at which time she was doing well.   Severe sepsis ([place]) 06/03/2016   Sinus tachycardia 05/08/2016   Tobacco use disorder 04/20/2016   Overview:  Quit in May 2017.   Type 2 diabetes mellitus  with foot ulcer ([place]) 05/08/2016   Unstable angina ([place]) 05/22/2020   Ventricular tachycardia ([place]) 12/17/2020   Wound, surgical, infected 06/06/2016   Overview:  sternal   Past Surgical History:  Procedure Laterality Date   ABDOMINAL AORTOGRAM W/LOWER EXTREMITY N/A 10/11/2021   Procedure: ABDOMINAL AORTOGRAM W/LOWER EXTREMITY;  Surgeon: Marty Heck, MD;  Location: [place] [facility];  Service: Cardiovascular;  Laterality: N/A;   AMPUTATION TOE Right 02/20/2020   Procedure: AMPUTATION RIGHT GREAT  TOE;  Surgeon: Felipa Furnace, DPM;  Location: [place];  Service: Podiatry;  Laterality: Right;   BLADDER SURGERY     BONE BIOPSY Left 02/01/2022   Procedure: BONE BIOPSY LEFT FOOT, IRRIGATION AND DEBRIDEMENT;  Surgeon: Landis Martins, DPM;  Location: [place];  Service: Podiatry;  Laterality: Left;   BUBBLE STUDY  11/27/2021   Procedure: BUBBLE STUDY;  Surgeon: Fay Records, MD;  Location: [place];  Service: Cardiovascular;;   CARDIAC CATHETERIZATION     CORONARY ARTERY BYPASS GRAFT     CORONARY STENT INTERVENTION N/A 05/25/2020   Procedure: CORONARY STENT INTERVENTION;  Surgeon: Wellington Hampshire, MD;  Location: [place] [facility];  Service: Cardiovascular;  Laterality: N/A;   CORONARY STENT INTERVENTION N/A 06/22/2020   Procedure: CORONARY  STENT INTERVENTION;  Surgeon: Leonie Man, MD;  Location: [place] [facility];  Service: Cardiovascular;  Laterality: N/A;   ICD IMPLANT N/A 12/19/2020   Procedure: ICD IMPLANT;  Surgeon: Evans Lance, MD;  Location: [place] [facility];  Service: Cardiovascular;  Laterality: N/A;   INCISION AND DRAINAGE Right 02/19/2020   Procedure: INCISION AND DRAINAGE;  Surgeon: Trula Slade, DPM;  Location: [place];  Service: Podiatry;  Laterality: Right;  Block done by surgeon   INTRAVASCULAR ULTRASOUND/IVUS N/A 06/22/2020   Procedure: Intravascular Ultrasound/IVUS;  Surgeon: Leonie Man, MD;  Location: [place] [facility];  Service: Cardiovascular;  Laterality: N/A;   IRRIGATION AND DEBRIDEMENT FOOT Left 10/10/2021   Procedure: IRRIGATION AND DEBRIDEMENT FOOT;  Surgeon: Landis Martins, DPM;  Location: [place];  Service: Podiatry;  Laterality: Left;   IRRIGATION AND DEBRIDEMENT FOOT Left 11/23/2021   Procedure: IRRIGATION AND DEBRIDEMENT FOOT;  Surgeon: Lorenda Peck, MD;  Location: [place];  Service: Podiatry;  Laterality: Left;  will do local block   LEFT HEART CATH AND CORS/GRAFTS ANGIOGRAPHY N/A 05/23/2020   Procedure: LEFT HEART CATH AND CORS/GRAFTS ANGIOGRAPHY;  Surgeon: Nelva Bush, MD;  Location: [place] [facility];  Service: Cardiovascular;  Laterality: N/A;   LEFT HEART CATH AND CORS/GRAFTS ANGIOGRAPHY N/A 12/19/2020   Procedure: LEFT HEART CATH AND CORS/GRAFTS ANGIOGRAPHY;  Surgeon: Burnell Blanks, MD;  Location: [place] [facility];  Service: Cardiovascular;  Laterality: N/A;   LEFT HEART CATH AND CORS/GRAFTS ANGIOGRAPHY N/A 07/21/2021   Procedure: LEFT HEART CATH AND CORS/GRAFTS ANGIOGRAPHY;  Surgeon: Nelva Bush, MD;  Location: [place] [facility];  Service: Cardiovascular;  Laterality: N/A;   METATARSAL HEAD EXCISION Right 05/02/2020   Procedure: FIRST METATARSAL HEAD RESECTION; RIGHT FOOT WOUND CLOSURE;  Surgeon: Landis Martins, DPM;  Location: [place];   Service: Podiatry;  Laterality: Right;  MAC W/LOCAL   PERIPHERAL VASCULAR BALLOON ANGIOPLASTY Left 10/11/2021   Procedure: PERIPHERAL VASCULAR BALLOON ANGIOPLASTY;  Surgeon: Marty Heck, MD;  Location: [place] [facility];  Service: Cardiovascular;  Laterality: Left;  Anterior Tibial Artery   TEE WITHOUT CARDIOVERSION N/A 11/27/2021   Procedure: TRANSESOPHAGEAL ECHOCARDIOGRAM (TEE);  Surgeon: Fay Records, MD;  Location: [facility];  Service: Cardiovascular;  Laterality: N/A;   TRANSMETATARSAL AMPUTATION Left 11/23/2021   Procedure: TRANSMETATARSAL AMPUTATION;  Surgeon: Lorenda Peck, MD;  Location: [place];  Service: Podiatry;  Laterality: Left;   TUBAL LIGATION      Family History  Problem Relation Age of Onset   Hypertension Mother    Hyperlipidemia Mother    Heart attack Father    Heart disease Father    Hypertension Father    Alzheimer's disease Father    Hypertension Brother    Hyperlipidemia Brother    Social History   Socioeconomic History   Marital status: Married    Spouse name: Not on file   Number of children: Not on file   Years of education: Not on file   Highest education level: Not on file  Occupational History   Not on file  Tobacco Use   Smoking status: Former    Types: Cigarettes    Quit date: 05/2016    Years since quitting: 6.3   Smokeless tobacco: Never  Vaping Use   Vaping Use: Never used  Substance and Sexual Activity   Alcohol use: Yes    Comment: social wine   Drug use: No   Sexual activity: Yes    Birth control/protection: Surgical    Comment: Hysterectomy  Other Topics Concern   Not on file  Social History Narrative   Not on file   Social Determinants of Health   Financial Resource Strain: Not on file  Food Insecurity: Not on file  Transportation Needs: Not on file  Physical Activity: Not on file  Stress: Not on file  Social Connections: Not on file    Review of Systems CONSTITUTIONAL: Negative for chills, fatigue,  fever, unintentional weight gain and unintentional weight loss.  E/N/T: Negative for ear pain, nasal congestion and sore throat.  CARDIOVASCULAR: Negative for chest pain, dizziness, palpitations and pedal edema.  RESPIRATORY: Negative for recent cough and dyspnea.  GASTROINTESTINAL: Negative for abdominal pain, acid reflux symptoms, constipation, diarrhea, nausea and vomiting.  MSK: Negative for arthralgias and myalgias.  INTEGUMENTARY: see HPI NEUROLOGICAL: Negative for dizziness and headaches.  PSYCHIATRIC: Negative for sleep disturbance and to question depression screen.  Negative for depression, negative for anhedonia.       Objective:  PHYSICAL EXAM:   VS: BP 122/80 (BP Location: Left Arm, Patient Position: Sitting, Cuff Size: Large)   Pulse 69   Temp (!) 96.9 F (36.1 C) (Temporal)   Ht _0  (1.702 m)   Wt 228 lb (103.4 kg)   LMP  (LMP Unknown)   SpO2 98%   BMI 35.71 kg/m   GEN: Well nourished, well developed, in no acute distress  Cardiac: RRR; no murmurs, rubs, or gallops,no edema - no significant varicosities Respiratory:  normal respiratory rate and pattern with no distress - normal breath sounds with no rales, rhonchi, wheezes or rubs MS: see foot exam Skin: warm and dry, no rash  Neuro:  Alert and Oriented x 3,- CN II-Xii grossly intact Psych: euthymic mood, appropriate affect and demeanor Diabetic Foot Exam - Simple   Simple Foot Form Diabetic Foot exam was performed with the following findings: Yes 09/20/2022 12:00 PM  Visual Inspection Sensation Testing Pulse Check Comments Pt has amputation right great toe Pt has all toes left foot amputated - pressure ulcer noted on heel      Lab Results  Component Value Date   WBC 7.1 05/22/2022   HGB 11.5 05/22/2022   HCT  34.8 05/22/2022   PLT 269 05/22/2022   GLUCOSE 280 (H) 05/22/2022   CHOL 175 05/22/2022   TRIG 371 (H) 05/22/2022   HDL 53 05/22/2022   LDLDIRECT 91.4 05/22/2020   LDLCALC 65 05/22/2022    ALT 23 05/22/2022   AST 25 05/22/2022   NA 137 05/22/2022   K 5.2 05/22/2022   CL 104 05/22/2022   CREATININE 1.13 (H) 05/22/2022   BUN 24 05/22/2022   CO2 23 05/22/2022   TSH 2.270 05/22/2022   INR 1.1 07/20/2021   HGBA1C 8.9 (H) 05/22/2022   MICROALBUR 293.5 (H) 07/21/2021      Assessment & Plan:   Problem List Items Addressed This Visit       Cardiovascular and Mediastinum   Chronic diastolic heart failure (HCC)   Relevant Medications   nitroGLYCERIN (NITROSTAT) 0.4 MG SL tablet Continue meds Follow up with cardiology   Essential hypertension - Primary   Relevant Medications   nitroGLYCERIN (NITROSTAT) 0.4 MG SL tablet Continue all meds Follow with cardiology   Other Relevant Orders   CBC with Differential/Platelet   Comprehensive metabolic panel   TSH   Peripheral vascular disease (HCC)   Relevant Medications   nitroGLYCERIN (NITROSTAT) 0.4 MG SL tablet     Digestive   GERD (gastroesophageal reflux disease)   Relevant Medications   omeprazole (PRILOSEC) 20 MG capsule     Endocrine   Diabetic foot ulcer ([place])   Relevant Medications   insulin degludec (TRESIBA FLEXTOUCH) 100 UNIT/ML FlexTouch Pen Continue follow up with podiatry   Diabetes mellitus due to underlying condition with hyperglycemia, with long-term current use of insulin (HCC)   Relevant Medications   insulin degludec (TRESIBA FLEXTOUCH) 100 UNIT/ML FlexTouch Pen   Other Relevant Orders   CBC with Differential/Platelet   Comprehensive metabolic panel   TSH   Hemoglobin A1c   Microalbumin / creatinine urine ratio     Musculoskeletal and Integument   Intertrigo   Relevant Medications   nystatin (MYCOSTATIN/NYSTOP) powder     Other   Hyperlipidemia   Relevant Medications   nitroGLYCERIN (NITROSTAT) 0.4 MG SL tablet   Other Relevant Orders   Lipid panel Continue meds   ICD (implantable cardioverter-defibrillator) in place   Relevant Medications   nitroGLYCERIN (NITROSTAT) 0.4 MG SL  tablet Follow with cardiology   Anxiety Continue lorazepam   Partial nontraumatic amputation of left foot (HCC)   Vitamin D deficiency   Relevant Orders   VITAMIN D 25 Hydroxy (Vit-D Deficiency, Fractures)   Amputation of right great toe (HCC)   Absolute anemia - other iron def anemia   Relevant Orders   Iron, TIBC and Ferritin Panel   B12 deficiency   Relevant Orders   B12 and Folate Panel   Needs flu shot   Relevant Orders   Flu Vaccine MDCK QUAD PF (Completed)   Colon cancer screening   Relevant Orders   Ambulatory referral to Gastroenterology  .  Meds ordered this encounter  Medications   nitroGLYCERIN (NITROSTAT) 0.4 MG SL tablet    Sig: Place 1 tablet (0.4 mg total) under the tongue every 5 (five) minutes as needed for chest pain.    Dispense:  25 tablet    Refill:  6    Order Specific Question:   Supervising Provider    Answer:   Shelton Silvas   nystatin (MYCOSTATIN/NYSTOP) powder    Sig: Apply 1 Application topically daily as needed (rash/yeast).    Dispense:  15 g  Refill:  2    Order Specific Question:   Supervising Provider    Answer:   Shelton Silvas   insulin degludec (TRESIBA FLEXTOUCH) 100 UNIT/ML FlexTouch Pen    Sig: Inject 50 Units into the skin daily.    Dispense:  9 mL    Refill:  3    Order Specific Question:   Supervising Provider    Answer:   Shelton Silvas   omeprazole (PRILOSEC) 20 MG capsule    Sig: Take 1 capsule (20 mg total) by mouth daily.    Dispense:  90 capsule    Refill:  1    Order Specific Question:   Supervising Provider    Answer:   Shelton Silvas    Orders Placed This Encounter  Procedures   Flu Vaccine MDCK QUAD PF   CBC with Differential/Platelet   Comprehensive metabolic panel   TSH   Lipid panel   Hemoglobin A1c   VITAMIN D 25 Hydroxy (Vit-D Deficiency, Fractures)   Iron, TIBC and Ferritin Panel   B12 and Folate Panel   Microalbumin / creatinine urine ratio   Ambulatory referral to  Gastroenterology     Follow-up: Return in about 3 months (around 12/21/2022) for chronic fasting - 40 min.  An After Visit Summary was printed and given to the patient.  Yetta Flock [facility] [phone]

## 2022-09-21 LAB — HEMOGLOBIN A1C
Est. average glucose Bld gHb Est-mCnc: 163 mg/dL
Hgb A1c MFr Bld: 7.3 % — ABNORMAL HIGH (ref 4.8–5.6)

## 2022-09-21 LAB — CBC WITH DIFFERENTIAL/PLATELET
Basophils Absolute: 0.1 10*3/uL (ref 0.0–0.2)
Basos: 1 %
EOS (ABSOLUTE): 0.2 10*3/uL (ref 0.0–0.4)
Eos: 3 %
Hematocrit: 36.4 % (ref 34.0–46.6)
Hemoglobin: 12.3 g/dL (ref 11.1–15.9)
Immature Grans (Abs): 0 10*3/uL (ref 0.0–0.1)
Immature Granulocytes: 0 %
Lymphocytes Absolute: 1.9 10*3/uL (ref 0.7–3.1)
Lymphs: 25 %
MCH: 29.6 pg (ref 26.6–33.0)
MCHC: 33.8 g/dL (ref 31.5–35.7)
MCV: 88 fL (ref 79–97)
Monocytes Absolute: 0.5 10*3/uL (ref 0.1–0.9)
Monocytes: 6 %
Neutrophils Absolute: 5 10*3/uL (ref 1.4–7.0)
Neutrophils: 65 %
Platelets: 293 10*3/uL (ref 150–450)
RBC: 4.15 x10E6/uL (ref 3.77–5.28)
RDW: 13.7 % (ref 11.7–15.4)
WBC: 7.6 10*3/uL (ref 3.4–10.8)

## 2022-09-21 LAB — COMPREHENSIVE METABOLIC PANEL
ALT: 18 IU/L (ref 0–32)
AST: 22 IU/L (ref 0–40)
Albumin/Globulin Ratio: 1.5 (ref 1.2–2.2)
Albumin: 4.5 g/dL (ref 3.9–4.9)
Alkaline Phosphatase: 56 IU/L (ref 44–121)
BUN/Creatinine Ratio: 22 (ref 12–28)
BUN: 24 mg/dL (ref 8–27)
Bilirubin Total: 0.2 mg/dL (ref 0.0–1.2)
CO2: 21 mmol/L (ref 20–29)
Calcium: 10.2 mg/dL (ref 8.7–10.3)
Chloride: 106 mmol/L (ref 96–106)
Creatinine, Ser: 1.09 mg/dL — ABNORMAL HIGH (ref 0.57–1.00)
Globulin, Total: 3 g/dL (ref 1.5–4.5)
Glucose: 120 mg/dL — ABNORMAL HIGH (ref 70–99)
Potassium: 4.8 mmol/L (ref 3.5–5.2)
Sodium: 141 mmol/L (ref 134–144)
Total Protein: 7.5 g/dL (ref 6.0–8.5)
eGFR: 58 mL/min/{1.73_m2} — ABNORMAL LOW (ref >59)

## 2022-09-21 LAB — LIPID PANEL
Chol/HDL Ratio: 3.1 ratio (ref 0.0–4.4)
Cholesterol, Total: 188 mg/dL (ref 100–199)
HDL: 61 mg/dL (ref >39)
LDL Chol Calc (NIH): 93 mg/dL (ref 0–99)
Triglycerides: 205 mg/dL — ABNORMAL HIGH (ref 0–149)
VLDL Cholesterol Cal: 34 mg/dL (ref 5–40)

## 2022-09-21 LAB — B12 AND FOLATE PANEL
Folate: 18.8 ng/mL (ref >3.0)
Vitamin B-12: >2000 pg/mL — ABNORMAL HIGH (ref 232–1245)

## 2022-09-21 LAB — IRON,TIBC AND FERRITIN PANEL
Ferritin: 95 ng/mL (ref 15–150)
Iron Saturation: 13 % — ABNORMAL LOW (ref 15–55)
Iron: 52 ug/dL (ref 27–139)
Total Iron Binding Capacity: 412 ug/dL (ref 250–450)
UIBC: 360 ug/dL (ref 118–369)

## 2022-09-21 LAB — MICROALBUMIN / CREATININE URINE RATIO
Creatinine, Urine: 58.7 mg/dL
Microalb/Creat Ratio: 625 mg/g creat — ABNORMAL HIGH (ref 0–29)
Microalbumin, Urine: 366.8 ug/mL

## 2022-09-21 LAB — VITAMIN D 25 HYDROXY (VIT D DEFICIENCY, FRACTURES): Vit D, 25-Hydroxy: 32.4 ng/mL (ref 30.0–100.0)

## 2022-09-21 LAB — TSH: TSH: 1.97 u[IU]/mL (ref 0.450–4.500)

## 2022-09-21 LAB — CARDIOVASCULAR RISK ASSESSMENT

## 2022-09-26 ENCOUNTER — Other Ambulatory Visit: Payer: Self-pay | Admitting: Physician Assistant

## 2022-09-26 ENCOUNTER — Ambulatory Visit (INDEPENDENT_AMBULATORY_CARE_PROVIDER_SITE_OTHER): Payer: BC Managed Care – PPO

## 2022-09-26 ENCOUNTER — Ambulatory Visit: Payer: BC Managed Care – PPO | Admitting: Podiatry

## 2022-09-26 DIAGNOSIS — R809 Proteinuria, unspecified: Secondary | ICD-10-CM

## 2022-09-26 DIAGNOSIS — E0843 Diabetes mellitus due to underlying condition with diabetic autonomic (poly)neuropathy: Secondary | ICD-10-CM

## 2022-09-26 DIAGNOSIS — L97522 Non-pressure chronic ulcer of other part of left foot with fat layer exposed: Secondary | ICD-10-CM | POA: Diagnosis not present

## 2022-09-26 NOTE — Progress Notes (Signed)
Chief Complaint  Patient presents with   Blister     L BLISTER ON BOTTOM NOW A BLISTER HAS FORMED ON THE SIDE     Subjective:  61 y.o. female with PMHx of diabetes mellitus PSxHx TMA LT foot presenting for follow-up evaluation of an ulcer that developed to the plantar aspect of the fifth metatarsal left foot.  Patient states that the padding to the diabetic shoes increased pressure to the lateral aspect of the foot.  She presents today wearing a postsurgical shoe.  They have been applying the gentamicin cream and a light dressing.  Past Medical History:  Diagnosis Date   Abnormal EKG 10/23/2021   Abnormal mammogram of left breast 09/04/2021   Abnormal stress test 10/23/2021   Acute chest pain 05/08/2016   AICD (automatic cardioverter/defibrillator) present    St Jude/Abbott device   AKI (acute kidney injury) ([place]) 10/23/2021   Angina pectoris ([place]) 09/11/2017   Anxiety 10/23/2021   Bacteremia 11/22/2021   Breast wound, right, subsequent encounter 09/04/2021   Burping 05/08/2016   Cancer ([place])    cervical - hysterectomy   Cellulitis of right breast 10/23/2021   Cellulitis, wound, post-operative 10/09/2021   Chronic diastolic heart failure ([place]) 11/06/2016   Coronary artery disease involving native coronary artery of native heart with angina pectoris ([place]) 09/12/2015   Overview:  PCI and stent of RCA 2009, last cath 2012 with medical therapy  CABG May 2017   Demand ischemia    Diabetic foot infection ([place]) 11/21/2021   Diabetic foot ulcer ([place]) 02/16/2020   Emphysema lung ([place]) 09/11/2017   patient not aware of this dx   Essential hypertension 09/12/2015   Gangrene ([place]) 10/23/2021   GERD (gastroesophageal reflux disease) 05/08/2016   Hematoma 10/23/2021   Hiatal hernia 10/23/2021   Hyperglycemia 10/23/2021   Hyperglycemia due to type 2 diabetes mellitus ([place]) 10/23/2021   Hyperlipidemia 09/12/2015   Hyponatremia 10/23/2021   ICD (implantable cardioverter-defibrillator) in place  05/16/2021   Infection of great toe 10/23/2021   Lactic acidosis 10/23/2021   Leukocytosis 10/23/2021   Mediastinitis 06/28/2016   Morbid obesity ([place]) 05/08/2016   Myocardial infarction [facility])    NSTEMI (non-ST elevated myocardial infarction) ([place]) 05/22/2020   Obesity (BMI 30-39.9) 10/23/2021   Perineal abscess 10/23/2021   Peripheral vascular disease ([place])    S/P CABG (coronary artery bypass graft) 06/03/2016   Overview:  The patient underwent sternal reconstruction on 06/21/16 with pec flaps for mediastinitis from a prior CABG in May 2017. On admission, she was critically ill from sepsis and had altered mental status. She was last seen in clinic on 08/09/16 at which time she was doing well.   Severe sepsis ([place]) 06/03/2016   Sinus tachycardia 05/08/2016   Tobacco use disorder 04/20/2016   Overview:  Quit in May 2017.   Type 2 diabetes mellitus with foot ulcer ([place]) 05/08/2016   Unstable angina ([place]) 05/22/2020   Ventricular tachycardia ([place]) 12/17/2020   Wound, surgical, infected 06/06/2016   Overview:  sternal    Past Surgical History:  Procedure Laterality Date   ABDOMINAL AORTOGRAM W/LOWER EXTREMITY N/A 10/11/2021   Procedure: ABDOMINAL AORTOGRAM W/LOWER EXTREMITY;  Surgeon: Marty Heck, MD;  Location: [place] [facility];  Service: Cardiovascular;  Laterality: N/A;   AMPUTATION TOE Right 02/20/2020   Procedure: AMPUTATION RIGHT GREAT  TOE;  Surgeon: Felipa Furnace, DPM;  Location: [place];  Service: Podiatry;  Laterality: Right;   BLADDER SURGERY     BONE BIOPSY  Left 02/01/2022   Procedure: BONE BIOPSY LEFT FOOT, IRRIGATION AND DEBRIDEMENT;  Surgeon: Landis Martins, DPM;  Location: [place];  Service: Podiatry;  Laterality: Left;   BUBBLE STUDY  11/27/2021   Procedure: BUBBLE STUDY;  Surgeon: Fay Records, MD;  Location: [place];  Service: Cardiovascular;;   CARDIAC CATHETERIZATION     CORONARY ARTERY BYPASS GRAFT     CORONARY STENT INTERVENTION N/A 05/25/2020   Procedure: CORONARY  STENT INTERVENTION;  Surgeon: Wellington Hampshire, MD;  Location: [place] [facility];  Service: Cardiovascular;  Laterality: N/A;   CORONARY STENT INTERVENTION N/A 06/22/2020   Procedure: CORONARY STENT INTERVENTION;  Surgeon: Leonie Man, MD;  Location: [place] [facility];  Service: Cardiovascular;  Laterality: N/A;   ICD IMPLANT N/A 12/19/2020   Procedure: ICD IMPLANT;  Surgeon: Genowefa Morga Lance, MD;  Location: [place] [facility];  Service: Cardiovascular;  Laterality: N/A;   INCISION AND DRAINAGE Right 02/19/2020   Procedure: INCISION AND DRAINAGE;  Surgeon: Trula Slade, DPM;  Location: [place];  Service: Podiatry;  Laterality: Right;  Block done by surgeon   INTRAVASCULAR ULTRASOUND/IVUS N/A 06/22/2020   Procedure: Intravascular Ultrasound/IVUS;  Surgeon: Leonie Man, MD;  Location: [place] [facility];  Service: Cardiovascular;  Laterality: N/A;   IRRIGATION AND DEBRIDEMENT FOOT Left 10/10/2021   Procedure: IRRIGATION AND DEBRIDEMENT FOOT;  Surgeon: Landis Martins, DPM;  Location: [place];  Service: Podiatry;  Laterality: Left;   IRRIGATION AND DEBRIDEMENT FOOT Left 11/23/2021   Procedure: IRRIGATION AND DEBRIDEMENT FOOT;  Surgeon: Lorenda Peck, MD;  Location: [place];  Service: Podiatry;  Laterality: Left;  will do local block   LEFT HEART CATH AND CORS/GRAFTS ANGIOGRAPHY N/A 05/23/2020   Procedure: LEFT HEART CATH AND CORS/GRAFTS ANGIOGRAPHY;  Surgeon: Nelva Bush, MD;  Location: [place] [facility];  Service: Cardiovascular;  Laterality: N/A;   LEFT HEART CATH AND CORS/GRAFTS ANGIOGRAPHY N/A 12/19/2020   Procedure: LEFT HEART CATH AND CORS/GRAFTS ANGIOGRAPHY;  Surgeon: Burnell Blanks, MD;  Location: [place] [facility];  Service: Cardiovascular;  Laterality: N/A;   LEFT HEART CATH AND CORS/GRAFTS ANGIOGRAPHY N/A 07/21/2021   Procedure: LEFT HEART CATH AND CORS/GRAFTS ANGIOGRAPHY;  Surgeon: Nelva Bush, MD;  Location: [place] [facility];  Service: Cardiovascular;  Laterality:  N/A;   METATARSAL HEAD EXCISION Right 05/02/2020   Procedure: FIRST METATARSAL HEAD RESECTION; RIGHT FOOT WOUND CLOSURE;  Surgeon: Landis Martins, DPM;  Location: [place];  Service: Podiatry;  Laterality: Right;  MAC W/LOCAL   PERIPHERAL VASCULAR BALLOON ANGIOPLASTY Left 10/11/2021   Procedure: PERIPHERAL VASCULAR BALLOON ANGIOPLASTY;  Surgeon: Marty Heck, MD;  Location: [place] [facility];  Service: Cardiovascular;  Laterality: Left;  Anterior Tibial Artery   TEE WITHOUT CARDIOVERSION N/A 11/27/2021   Procedure: TRANSESOPHAGEAL ECHOCARDIOGRAM (TEE);  Surgeon: Fay Records, MD;  Location: [facility] ENDOSCOPY;  Service: Cardiovascular;  Laterality: N/A;   TRANSMETATARSAL AMPUTATION Left 11/23/2021   Procedure: TRANSMETATARSAL AMPUTATION;  Surgeon: Lorenda Peck, MD;  Location: [place];  Service: Podiatry;  Laterality: Left;   TUBAL LIGATION      Allergies  Allergen Reactions   Vancomycin Anaphylaxis   Chlorhexidine Gluconate Itching    Received CHG bath, began itching, required benadryl   Cat Hair Extract Other (See Comments)    Sneezing, watery eyes.   Tramadol Other (See Comments)    Hallucinations   Codeine Rash    Objective/Physical Exam General: The patient is alert and oriented x3 in no  acute distress.  Dermatology:  Over improvement of the wound.  It now measures approximately 1.0 x 1.0 x 0.1 cm.  To the noted ulceration(s), there is no eschar. There is minimal slough, fibrin, and necrotic tissue noted. Granulation tissue and wound base is red.  Negative for any appreciable drainage.  There is no exposed bone muscle-tendon ligament or joint. There is no malodor. Periwound integrity is intact.  Overall the wound appears very stable Skin is warm, dry and supple bilateral lower extremities.  Vascular: Palpable pedal pulses bilaterally. No edema or erythema noted. Capillary refill within normal limits.  Neurological: Epicritic and protective threshold  diminished bilaterally.   Musculoskeletal Exam: PSxHx transmetatarsal amputation LT; healed.  Patient also has a very prominent forefoot varus deformity which increases lateral column loading.  This is likely the reason for the ulcer to the lateral aspect of the foot.  Radiographic exam LT foot 09/26/2022: Clean osteotomy sites.  No cortical erosions, fractures noted.  Radiographically no evidence of osteomyelitis  Assessment: 1.  Ulcer left foot secondary to diabetes mellitus 2. diabetes mellitus w/ peripheral neuropathy 3.  History of transmetatarsal amputation left   Plan of Care:  1. Patient was evaluated.  X-rays reviewed 2. medically necessary excisional debridement including subcutaneous tissue was performed using a tissue nipper and a chisel blade. Excisional debridement of all the necrotic nonviable tissue down to healthy bleeding viable tissue was performed with post-debridement measurements same as pre-. 3. the wound was cleansed and dry sterile dressing applied. 4.  New gentamicin cream daily with a light dressing 5.  Discontinue the diabetic shoe that the patient wears because it was causing irritation to the lateral aspect of the foot.  Patient has a postsurgical shoe and offloading felt pads were applied to the insole of the surgical shoe 6.  Patient is to return to clinic in 3 weeks  *Niece, Carloyn Jaeger, with her at today's visit   Edrick Kins, DPM [facility]  Dr. Edrick Kins, DPM    [address], [place] [address]                Office [phone]  Fax [phone]

## 2022-10-01 ENCOUNTER — Ambulatory Visit: Payer: BC Managed Care – PPO | Admitting: Podiatry

## 2022-10-01 NOTE — Progress Notes (Signed)
Remote ICD transmission.   

## 2022-10-03 ENCOUNTER — Ambulatory Visit: Payer: BC Managed Care – PPO | Admitting: Podiatry

## 2022-10-03 DIAGNOSIS — E0843 Diabetes mellitus due to underlying condition with diabetic autonomic (poly)neuropathy: Secondary | ICD-10-CM

## 2022-10-03 DIAGNOSIS — L97522 Non-pressure chronic ulcer of other part of left foot with fat layer exposed: Secondary | ICD-10-CM | POA: Diagnosis not present

## 2022-10-03 NOTE — Progress Notes (Signed)
Chief Complaint  Patient presents with   Wound Check    Patient is here for pressure sore is worse again     Subjective:  61 y.o. female with PMHx of diabetes mellitus PSxHx TMA LT foot presenting for follow-up evaluation of an ulcer that developed to the plantar aspect of the fifth metatarsal left foot.  Patient is doing well.  She is applying the antibiotic cream and a light dressing as instructed  Past Medical History:  Diagnosis Date   Abnormal EKG 10/23/2021   Abnormal mammogram of left breast 09/04/2021   Abnormal stress test 10/23/2021   Acute chest pain 05/08/2016   AICD (automatic cardioverter/defibrillator) present    St Jude/Abbott device   AKI (acute kidney injury) ([place]) 10/23/2021   Angina pectoris ([place]) 09/11/2017   Anxiety 10/23/2021   Bacteremia 11/22/2021   Breast wound, right, subsequent encounter 09/04/2021   Burping 05/08/2016   Cancer ([place])    cervical - hysterectomy   Cellulitis of right breast 10/23/2021   Cellulitis, wound, post-operative 10/09/2021   Chronic diastolic heart failure ([place]) 11/06/2016   Coronary artery disease involving native coronary artery of native heart with angina pectoris ([place]) 09/12/2015   Overview:  PCI and stent of RCA 2009, last cath 2012 with medical therapy  CABG May 2017   Demand ischemia    Diabetic foot infection ([place]) 11/21/2021   Diabetic foot ulcer ([place]) 02/16/2020   Emphysema lung ([place]) 09/11/2017   patient not aware of this dx   Essential hypertension 09/12/2015   Gangrene ([place]) 10/23/2021   GERD (gastroesophageal reflux disease) 05/08/2016   Hematoma 10/23/2021   Hiatal hernia 10/23/2021   Hyperglycemia 10/23/2021   Hyperglycemia due to type 2 diabetes mellitus ([place]) 10/23/2021   Hyperlipidemia 09/12/2015   Hyponatremia 10/23/2021   ICD (implantable cardioverter-defibrillator) in place 05/16/2021   Infection of great toe 10/23/2021   Lactic acidosis 10/23/2021   Leukocytosis 10/23/2021   Mediastinitis 06/28/2016   Morbid  obesity ([place]) 05/08/2016   Myocardial infarction [facility])    NSTEMI (non-ST elevated myocardial infarction) ([place]) 05/22/2020   Obesity (BMI 30-39.9) 10/23/2021   Perineal abscess 10/23/2021   Peripheral vascular disease ([place])    S/P CABG (coronary artery bypass graft) 06/03/2016   Overview:  The patient underwent sternal reconstruction on 06/21/16 with pec flaps for mediastinitis from a prior CABG in May 2017. On admission, she was critically ill from sepsis and had altered mental status. She was last seen in clinic on 08/09/16 at which time she was doing well.   Severe sepsis ([place]) 06/03/2016   Sinus tachycardia 05/08/2016   Tobacco use disorder 04/20/2016   Overview:  Quit in May 2017.   Type 2 diabetes mellitus with foot ulcer ([place]) 05/08/2016   Unstable angina ([place]) 05/22/2020   Ventricular tachycardia ([place]) 12/17/2020   Wound, surgical, infected 06/06/2016   Overview:  sternal    Past Surgical History:  Procedure Laterality Date   ABDOMINAL AORTOGRAM W/LOWER EXTREMITY N/A 10/11/2021   Procedure: ABDOMINAL AORTOGRAM W/LOWER EXTREMITY;  Surgeon: Marty Heck, MD;  Location: [place] [facility];  Service: Cardiovascular;  Laterality: N/A;   AMPUTATION TOE Right 02/20/2020   Procedure: AMPUTATION RIGHT GREAT  TOE;  Surgeon: Felipa Furnace, DPM;  Location: [place];  Service: Podiatry;  Laterality: Right;   BLADDER SURGERY     BONE BIOPSY Left 02/01/2022   Procedure: BONE BIOPSY LEFT FOOT, IRRIGATION AND DEBRIDEMENT;  Surgeon: Landis Martins, DPM;  Location: [place];  Service: Podiatry;  Laterality: Left;   BUBBLE STUDY  11/27/2021   Procedure: BUBBLE STUDY;  Surgeon: Fay Records, MD;  Location: [place];  Service: Cardiovascular;;   CARDIAC CATHETERIZATION     CORONARY ARTERY BYPASS GRAFT     CORONARY STENT INTERVENTION N/A 05/25/2020   Procedure: CORONARY STENT INTERVENTION;  Surgeon: Wellington Hampshire, MD;  Location: [place] [facility];  Service: Cardiovascular;  Laterality: N/A;   CORONARY  STENT INTERVENTION N/A 06/22/2020   Procedure: CORONARY STENT INTERVENTION;  Surgeon: Leonie Man, MD;  Location: [place] [facility];  Service: Cardiovascular;  Laterality: N/A;   ICD IMPLANT N/A 12/19/2020   Procedure: ICD IMPLANT;  Surgeon: Audrick Lamoureaux Lance, MD;  Location: [place] [facility] LAB;  Service: Cardiovascular;  Laterality: N/A;   INCISION AND DRAINAGE Right 02/19/2020   Procedure: INCISION AND DRAINAGE;  Surgeon: Trula Slade, DPM;  Location: [place];  Service: Podiatry;  Laterality: Right;  Block done by surgeon   INTRAVASCULAR ULTRASOUND/IVUS N/A 06/22/2020   Procedure: Intravascular Ultrasound/IVUS;  Surgeon: Leonie Man, MD;  Location: [place] [facility];  Service: Cardiovascular;  Laterality: N/A;   IRRIGATION AND DEBRIDEMENT FOOT Left 10/10/2021   Procedure: IRRIGATION AND DEBRIDEMENT FOOT;  Surgeon: Landis Martins, DPM;  Location: [place];  Service: Podiatry;  Laterality: Left;   IRRIGATION AND DEBRIDEMENT FOOT Left 11/23/2021   Procedure: IRRIGATION AND DEBRIDEMENT FOOT;  Surgeon: Lorenda Peck, MD;  Location: [place];  Service: Podiatry;  Laterality: Left;  will do local block   LEFT HEART CATH AND CORS/GRAFTS ANGIOGRAPHY N/A 05/23/2020   Procedure: LEFT HEART CATH AND CORS/GRAFTS ANGIOGRAPHY;  Surgeon: Nelva Bush, MD;  Location: [place] [facility];  Service: Cardiovascular;  Laterality: N/A;   LEFT HEART CATH AND CORS/GRAFTS ANGIOGRAPHY N/A 12/19/2020   Procedure: LEFT HEART CATH AND CORS/GRAFTS ANGIOGRAPHY;  Surgeon: Burnell Blanks, MD;  Location: [place] [facility];  Service: Cardiovascular;  Laterality: N/A;   LEFT HEART CATH AND CORS/GRAFTS ANGIOGRAPHY N/A 07/21/2021   Procedure: LEFT HEART CATH AND CORS/GRAFTS ANGIOGRAPHY;  Surgeon: Nelva Bush, MD;  Location: [place] [facility] LAB;  Service: Cardiovascular;  Laterality: N/A;   METATARSAL HEAD EXCISION Right 05/02/2020   Procedure: FIRST METATARSAL HEAD RESECTION; RIGHT FOOT WOUND CLOSURE;  Surgeon: Landis Martins, DPM;  Location: [place];  Service: Podiatry;  Laterality: Right;  MAC W/LOCAL   PERIPHERAL VASCULAR BALLOON ANGIOPLASTY Left 10/11/2021   Procedure: PERIPHERAL VASCULAR BALLOON ANGIOPLASTY;  Surgeon: Marty Heck, MD;  Location: [place] [facility];  Service: Cardiovascular;  Laterality: Left;  Anterior Tibial Artery   TEE WITHOUT CARDIOVERSION N/A 11/27/2021   Procedure: TRANSESOPHAGEAL ECHOCARDIOGRAM (TEE);  Surgeon: Fay Records, MD;  Location: [facility] ENDOSCOPY;  Service: Cardiovascular;  Laterality: N/A;   TRANSMETATARSAL AMPUTATION Left 11/23/2021   Procedure: TRANSMETATARSAL AMPUTATION;  Surgeon: Lorenda Peck, MD;  Location: [place];  Service: Podiatry;  Laterality: Left;   TUBAL LIGATION      Allergies  Allergen Reactions   Vancomycin Anaphylaxis   Chlorhexidine Gluconate Itching    Received CHG bath, began itching, required benadryl   Cat Hair Extract Other (See Comments)    Sneezing, watery eyes.   Tramadol Other (See Comments)    Hallucinations   Codeine Rash    Objective/Physical Exam General: The patient is alert and oriented x3 in no acute distress.  Dermatology:  Wound stable.  It measures approximately 1.0 x 1.0 x 0.1 cm.  To the noted ulceration(s), there is no  eschar. There is minimal slough, fibrin, and necrotic tissue noted. Granulation tissue and wound base is red.  Negative for any appreciable drainage.  There is no exposed bone muscle-tendon ligament or joint. There is no malodor. Periwound integrity is intact.  Overall the wound appears very stable Skin is warm, dry and supple bilateral lower extremities.  Vascular: Palpable pedal pulses bilaterally. No edema or erythema noted. Capillary refill within normal limits.  Neurological: Epicritic and protective threshold diminished bilaterally.   Musculoskeletal Exam: PSxHx transmetatarsal amputation LT; healed.  Patient also has a very prominent forefoot varus deformity which  increases lateral column loading.  This is likely the reason for the ulcer to the lateral aspect of the foot.  Radiographic exam LT foot 09/26/2022: Clean osteotomy sites.  No cortical erosions, fractures noted.  Radiographically no evidence of osteomyelitis  Assessment: 1.  Ulcer left foot secondary to diabetes mellitus 2. diabetes mellitus w/ peripheral neuropathy 3.  History of transmetatarsal amputation left   Plan of Care:  1. Patient was evaluated.  X-rays reviewed 2. medically necessary excisional debridement including subcutaneous tissue was performed using a tissue nipper and a chisel blade. Excisional debridement of all the necrotic nonviable tissue down to healthy bleeding viable tissue was performed with post-debridement measurements same as pre-. 3. the wound was cleansed and dry sterile dressing applied. 4.  Continue gentamicin cream daily with a light dressing 5.  Continue the postsurgical shoe with the offloading felt dancers pads 6.  Return to clinic 3 weeks  *Niece, Carloyn Jaeger, with her at today's visit   Edrick Kins, DPM [facility]  Dr. Edrick Kins, DPM    [address], [place] [address]                Office [phone]  Fax [phone]

## 2022-10-11 ENCOUNTER — Ambulatory Visit: Payer: Managed Care, Other (non HMO) | Admitting: Podiatry

## 2022-10-17 ENCOUNTER — Ambulatory Visit: Payer: BC Managed Care – PPO | Admitting: Podiatry

## 2022-10-24 ENCOUNTER — Ambulatory Visit: Payer: BC Managed Care – PPO | Admitting: Podiatry

## 2022-10-24 ENCOUNTER — Telehealth: Payer: Self-pay | Admitting: *Deleted

## 2022-10-24 DIAGNOSIS — E0843 Diabetes mellitus due to underlying condition with diabetic autonomic (poly)neuropathy: Secondary | ICD-10-CM | POA: Diagnosis not present

## 2022-10-24 DIAGNOSIS — L97522 Non-pressure chronic ulcer of other part of left foot with fat layer exposed: Secondary | ICD-10-CM

## 2022-10-24 DIAGNOSIS — M21172 Varus deformity, not elsewhere classified, left ankle: Secondary | ICD-10-CM

## 2022-10-24 NOTE — Telephone Encounter (Signed)
-----   Message from Edrick Kins, DPM sent at 10/24/2022 11:58 AM EST ----- Regarding: Home Health at [place] Patient requesting home health aide from a company called [employer].  Placing the order now.  Thanks, Dr. Amalia Hailey

## 2022-10-24 NOTE — Progress Notes (Signed)
Chief Complaint  Patient presents with   Foot Ulcer    Follow up left foot ulcer. Pt states she feels like the insert that she received her is causing the slow process.     Subjective:  61 y.o. female with PMHx of diabetes mellitus PSxHx TMA LT foot presenting for follow-up evaluation of an ulcer that developed to the plantar aspect of the fifth metatarsal left foot.  Patient is doing well.  She is applying the antibiotic cream and a light dressing as instructed  Past Medical History:  Diagnosis Date   Abnormal EKG 10/23/2021   Abnormal mammogram of left breast 09/04/2021   Abnormal stress test 10/23/2021   Acute chest pain 05/08/2016   AICD (automatic cardioverter/defibrillator) present    St Jude/Abbott device   AKI (acute kidney injury) ([place]) 10/23/2021   Angina pectoris ([place]) 09/11/2017   Anxiety 10/23/2021   Bacteremia 11/22/2021   Breast wound, right, subsequent encounter 09/04/2021   Burping 05/08/2016   Cancer ([place])    cervical - hysterectomy   Cellulitis of right breast 10/23/2021   Cellulitis, wound, post-operative 10/09/2021   Chronic diastolic heart failure ([place]) 11/06/2016   Coronary artery disease involving native coronary artery of native heart with angina pectoris ([place]) 09/12/2015   Overview:  PCI and stent of RCA 2009, last cath 2012 with medical therapy  CABG May 2017   Demand ischemia    Diabetic foot infection ([place]) 11/21/2021   Diabetic foot ulcer ([place]) 02/16/2020   Emphysema lung ([place]) 09/11/2017   patient not aware of this dx   Essential hypertension 09/12/2015   Gangrene ([place]) 10/23/2021   GERD (gastroesophageal reflux disease) 05/08/2016   Hematoma 10/23/2021   Hiatal hernia 10/23/2021   Hyperglycemia 10/23/2021   Hyperglycemia due to type 2 diabetes mellitus ([place]) 10/23/2021   Hyperlipidemia 09/12/2015   Hyponatremia 10/23/2021   ICD (implantable cardioverter-defibrillator) in place 05/16/2021   Infection of great toe 10/23/2021   Lactic acidosis 10/23/2021    Leukocytosis 10/23/2021   Mediastinitis 06/28/2016   Morbid obesity ([place]) 05/08/2016   Myocardial infarction [facility])    NSTEMI (non-ST elevated myocardial infarction) ([place]) 05/22/2020   Obesity (BMI 30-39.9) 10/23/2021   Perineal abscess 10/23/2021   Peripheral vascular disease ([place])    S/P CABG (coronary artery bypass graft) 06/03/2016   Overview:  The patient underwent sternal reconstruction on 06/21/16 with pec flaps for mediastinitis from a prior CABG in May 2017. On admission, she was critically ill from sepsis and had altered mental status. She was last seen in clinic on 08/09/16 at which time she was doing well.   Severe sepsis ([place]) 06/03/2016   Sinus tachycardia 05/08/2016   Tobacco use disorder 04/20/2016   Overview:  Quit in May 2017.   Type 2 diabetes mellitus with foot ulcer ([place]) 05/08/2016   Unstable angina ([place]) 05/22/2020   Ventricular tachycardia ([place]) 12/17/2020   Wound, surgical, infected 06/06/2016   Overview:  sternal    Past Surgical History:  Procedure Laterality Date   ABDOMINAL AORTOGRAM W/LOWER EXTREMITY N/A 10/11/2021   Procedure: ABDOMINAL AORTOGRAM W/LOWER EXTREMITY;  Surgeon: Marty Heck, MD;  Location: [facility];  Service: Cardiovascular;  Laterality: N/A;   AMPUTATION TOE Right 02/20/2020   Procedure: AMPUTATION RIGHT GREAT  TOE;  Surgeon: Felipa Furnace, DPM;  Location: [place];  Service: Podiatry;  Laterality: Right;   BLADDER SURGERY     BONE BIOPSY Left 02/01/2022   Procedure: BONE BIOPSY LEFT FOOT, IRRIGATION AND DEBRIDEMENT;  Surgeon: Landis Martins, DPM;  Location: [place];  Service: Podiatry;  Laterality: Left;   BUBBLE STUDY  11/27/2021   Procedure: BUBBLE STUDY;  Surgeon: Fay Records, MD;  Location: [place];  Service: Cardiovascular;;   CARDIAC CATHETERIZATION     CORONARY ARTERY BYPASS GRAFT     CORONARY STENT INTERVENTION N/A 05/25/2020   Procedure: CORONARY STENT INTERVENTION;  Surgeon: Wellington Hampshire, MD;  Location: [place] [facility];  Service: Cardiovascular;  Laterality: N/A;   CORONARY STENT INTERVENTION N/A 06/22/2020   Procedure: CORONARY STENT INTERVENTION;  Surgeon: Leonie Man, MD;  Location: [place] [facility];  Service: Cardiovascular;  Laterality: N/A;   ICD IMPLANT N/A 12/19/2020   Procedure: ICD IMPLANT;  Surgeon: Lional Icenogle Lance, MD;  Location: [place] [facility];  Service: Cardiovascular;  Laterality: N/A;   INCISION AND DRAINAGE Right 02/19/2020   Procedure: INCISION AND DRAINAGE;  Surgeon: Trula Slade, DPM;  Location: [place];  Service: Podiatry;  Laterality: Right;  Block done by surgeon   INTRAVASCULAR ULTRASOUND/IVUS N/A 06/22/2020   Procedure: Intravascular Ultrasound/IVUS;  Surgeon: Leonie Man, MD;  Location: [place] [facility];  Service: Cardiovascular;  Laterality: N/A;   IRRIGATION AND DEBRIDEMENT FOOT Left 10/10/2021   Procedure: IRRIGATION AND DEBRIDEMENT FOOT;  Surgeon: Landis Martins, DPM;  Location: [facility];  Service: Podiatry;  Laterality: Left;   IRRIGATION AND DEBRIDEMENT FOOT Left 11/23/2021   Procedure: IRRIGATION AND DEBRIDEMENT FOOT;  Surgeon: Lorenda Peck, MD;  Location: [facility];  Service: Podiatry;  Laterality: Left;  will do local block   LEFT HEART CATH AND CORS/GRAFTS ANGIOGRAPHY N/A 05/23/2020   Procedure: LEFT HEART CATH AND CORS/GRAFTS ANGIOGRAPHY;  Surgeon: Nelva Bush, MD;  Location: [place];  Service: Cardiovascular;  Laterality: N/A;   LEFT HEART CATH AND CORS/GRAFTS ANGIOGRAPHY N/A 12/19/2020   Procedure: LEFT HEART CATH AND CORS/GRAFTS ANGIOGRAPHY;  Surgeon: Burnell Blanks, MD;  Location: [place] [facility];  Service: Cardiovascular;  Laterality: N/A;   LEFT HEART CATH AND CORS/GRAFTS ANGIOGRAPHY N/A 07/21/2021   Procedure: LEFT HEART CATH AND CORS/GRAFTS ANGIOGRAPHY;  Surgeon: Nelva Bush, MD;  Location: [place] [facility];  Service: Cardiovascular;  Laterality: N/A;   METATARSAL HEAD EXCISION Right 05/02/2020   Procedure: FIRST METATARSAL  HEAD RESECTION; RIGHT FOOT WOUND CLOSURE;  Surgeon: Landis Martins, DPM;  Location: [place];  Service: Podiatry;  Laterality: Right;  MAC W/LOCAL   PERIPHERAL VASCULAR BALLOON ANGIOPLASTY Left 10/11/2021   Procedure: PERIPHERAL VASCULAR BALLOON ANGIOPLASTY;  Surgeon: Marty Heck, MD;  Location: [place] [facility];  Service: Cardiovascular;  Laterality: Left;  Anterior Tibial Artery   TEE WITHOUT CARDIOVERSION N/A 11/27/2021   Procedure: TRANSESOPHAGEAL ECHOCARDIOGRAM (TEE);  Surgeon: Fay Records, MD;  Location: [facility] ENDOSCOPY;  Service: Cardiovascular;  Laterality: N/A;   TRANSMETATARSAL AMPUTATION Left 11/23/2021   Procedure: TRANSMETATARSAL AMPUTATION;  Surgeon: Lorenda Peck, MD;  Location: [facility];  Service: Podiatry;  Laterality: Left;   TUBAL LIGATION      Allergies  Allergen Reactions   Vancomycin Anaphylaxis   Chlorhexidine Gluconate Itching    Received CHG bath, began itching, required benadryl   Cat Hair Extract Other (See Comments)    Sneezing, watery eyes.   Tramadol Other (See Comments)    Hallucinations   Codeine Rash         Objective/Physical Exam General: The patient is alert and oriented x3 in no acute distress.  Dermatology:  Wound stable.  It measures approximately 1.4x0.9 x 0.3 cm.  To the noted ulceration(s), there is no eschar. There is minimal slough, fibrin, and necrotic tissue noted. Granulation tissue and wound base is red.  Negative for any appreciable drainage.  There is no exposed bone muscle-tendon ligament or joint. There is no malodor. Periwound integrity is intact.  Overall the wound appears very stable Skin is warm, dry and supple bilateral lower extremities.  Vascular: Palpable pedal pulses bilaterally. No edema or erythema noted. Capillary refill within normal limits.  Neurological: Epicritic and protective threshold diminished bilaterally.   Musculoskeletal Exam: PSxHx transmetatarsal amputation LT; healed.   Patient also has a very prominent forefoot varus deformity which increases lateral column loading likely secondary to loss of the peroneus brevis tendon at the fifth metatarsal tubercle.  This is likely the reason for the ulcer to the lateral aspect of the foot.  Radiographic exam LT foot 09/26/2022: Clean osteotomy sites.  No cortical erosions, fractures noted.  Radiographically no evidence of osteomyelitis  Assessment: 1.  Ulcer left foot secondary to diabetes mellitus 2. diabetes mellitus w/ peripheral neuropathy 3.  History of transmetatarsal amputation left   Plan of Care:  1. Patient was evaluated.  X-rays reviewed 2. medically necessary excisional debridement including subcutaneous tissue was performed using a tissue nipper and a chisel blade. Excisional debridement of all the necrotic nonviable tissue down to healthy bleeding viable tissue was performed with post-debridement measurements same as pre-. 3. the wound was cleansed and dry sterile dressing applied. 4.  Continue gentamicin cream daily with a light dressing 5.  Diabetic insoles were modified today to increase pressure off of the lateral column of the foot.  Minimal weightbearing as tolerated 6.  Order placed for home health aide at [place] for activities of daily living 7.  Return to clinic 3 weeks  *Niece, Carloyn Jaeger, with her at today's visit   Edrick Kins, DPM [facility]  Dr. Edrick Kins, DPM    [address], [place] [address]                Office [phone]  Fax [phone]

## 2022-10-24 NOTE — Telephone Encounter (Signed)
I really don't know. Thanks for doing that! - Dr. Amalia Hailey

## 2022-10-24 NOTE — Telephone Encounter (Signed)
Faxed home health referral to [place], is it the [place] location?

## 2022-10-25 NOTE — Telephone Encounter (Signed)
Called [facility] and they do not have a location listed in [place], sent referral to home office, spoke with patient and she does not  know a lot about it ,will contact her niece who works for them and call me back with more information.

## 2022-10-25 NOTE — Telephone Encounter (Signed)
Patient's niece is calling and I told her that since the [facility] has no locations in [place], will send to [facility], verbalized understanding and said that if they have no availability, can send to [place])

## 2022-10-31 NOTE — Telephone Encounter (Signed)
Spoke the [facility](neice) and Prudencio Pair are not able to take patient and is requesting [facility], sent referral asked that they contact our office if no availability, confirmation received 10/31/22.

## 2022-11-13 ENCOUNTER — Other Ambulatory Visit: Payer: Self-pay | Admitting: Physician Assistant

## 2022-11-13 DIAGNOSIS — Z794 Long term (current) use of insulin: Secondary | ICD-10-CM

## 2022-11-13 MED ORDER — TRESIBA FLEXTOUCH 100 UNIT/ML ~~LOC~~ SOPN: 50.0000 [IU] | PEN_INJECTOR | Freq: Every day | SUBCUTANEOUS | 3 refills | Status: DC

## 2022-11-14 ENCOUNTER — Ambulatory Visit: Payer: BC Managed Care – PPO | Admitting: Podiatry

## 2022-11-14 DIAGNOSIS — E0843 Diabetes mellitus due to underlying condition with diabetic autonomic (poly)neuropathy: Secondary | ICD-10-CM

## 2022-11-14 DIAGNOSIS — L97522 Non-pressure chronic ulcer of other part of left foot with fat layer exposed: Secondary | ICD-10-CM | POA: Diagnosis not present

## 2022-11-14 MED ORDER — GENTAMICIN SULFATE 0.1 % EX CREA: 1.0000 | TOPICAL_CREAM | Freq: Two times a day (BID) | CUTANEOUS | 1 refills | Status: DC

## 2022-11-14 NOTE — Progress Notes (Signed)
Chief Complaint  Patient presents with   Foot Ulcer    Subjective:  61 y.o. female with PMHx of diabetes mellitus PSxHx TMA LT foot presenting for follow-up evaluation of an ulcer that developed to the plantar aspect of the fifth metatarsal left foot.  Patient is doing well.  She is applying the antibiotic cream and a light dressing as instructed  Past Medical History:  Diagnosis Date   Abnormal EKG 10/23/2021   Abnormal mammogram of left breast 09/04/2021   Abnormal stress test 10/23/2021   Acute chest pain 05/08/2016   AICD (automatic cardioverter/defibrillator) present    St Jude/Abbott device   AKI (acute kidney injury) ([place]) 10/23/2021   Angina pectoris ([place]) 09/11/2017   Anxiety 10/23/2021   Bacteremia 11/22/2021   Breast wound, right, subsequent encounter 09/04/2021   Burping 05/08/2016   Cancer ([place])    cervical - hysterectomy   Cellulitis of right breast 10/23/2021   Cellulitis, wound, post-operative 10/09/2021   Chronic diastolic heart failure ([place]) 11/06/2016   Coronary artery disease involving native coronary artery of native heart with angina pectoris ([place]) 09/12/2015   Overview:  PCI and stent of RCA 2009, last cath 2012 with medical therapy  CABG May 2017   Demand ischemia    Diabetic foot infection ([place]) 11/21/2021   Diabetic foot ulcer ([place]) 02/16/2020   Emphysema lung ([place]) 09/11/2017   patient not aware of this dx   Essential hypertension 09/12/2015   Gangrene ([place]) 10/23/2021   GERD (gastroesophageal reflux disease) 05/08/2016   Hematoma 10/23/2021   Hiatal hernia 10/23/2021   Hyperglycemia 10/23/2021   Hyperglycemia due to type 2 diabetes mellitus ([place]) 10/23/2021   Hyperlipidemia 09/12/2015   Hyponatremia 10/23/2021   ICD (implantable cardioverter-defibrillator) in place 05/16/2021   Infection of great toe 10/23/2021   Lactic acidosis 10/23/2021   Leukocytosis 10/23/2021   Mediastinitis 06/28/2016   Morbid obesity ([place]) 05/08/2016   Myocardial infarction [facility])     NSTEMI (non-ST elevated myocardial infarction) ([place]) 05/22/2020   Obesity (BMI 30-39.9) 10/23/2021   Perineal abscess 10/23/2021   Peripheral vascular disease ([place])    S/P CABG (coronary artery bypass graft) 06/03/2016   Overview:  The patient underwent sternal reconstruction on 06/21/16 with pec flaps for mediastinitis from a prior CABG in May 2017. On admission, she was critically ill from sepsis and had altered mental status. She was last seen in clinic on 08/09/16 at which time she was doing well.   Severe sepsis ([place]) 06/03/2016   Sinus tachycardia 05/08/2016   Tobacco use disorder 04/20/2016   Overview:  Quit in May 2017.   Type 2 diabetes mellitus with foot ulcer ([place]) 05/08/2016   Unstable angina ([place]) 05/22/2020   Ventricular tachycardia ([place]) 12/17/2020   Wound, surgical, infected 06/06/2016   Overview:  sternal    Past Surgical History:  Procedure Laterality Date   ABDOMINAL AORTOGRAM W/LOWER EXTREMITY N/A 10/11/2021   Procedure: ABDOMINAL AORTOGRAM W/LOWER EXTREMITY;  Surgeon: Marty Heck, MD;  Location: [place] [facility];  Service: Cardiovascular;  Laterality: N/A;   AMPUTATION TOE Right 02/20/2020   Procedure: AMPUTATION RIGHT GREAT  TOE;  Surgeon: Felipa Furnace, DPM;  Location: [facility];  Service: Podiatry;  Laterality: Right;   BLADDER SURGERY     BONE BIOPSY Left 02/01/2022   Procedure: BONE BIOPSY LEFT FOOT, IRRIGATION AND DEBRIDEMENT;  Surgeon: Landis Martins, DPM;  Location: [place];  Service: Podiatry;  Laterality: Left;   BUBBLE STUDY  11/27/2021   Procedure: BUBBLE  STUDY;  Surgeon: Fay Records, MD;  Location: [place];  Service: Cardiovascular;;   CARDIAC CATHETERIZATION     CORONARY ARTERY BYPASS GRAFT     CORONARY STENT INTERVENTION N/A 05/25/2020   Procedure: CORONARY STENT INTERVENTION;  Surgeon: Wellington Hampshire, MD;  Location: [place] [facility];  Service: Cardiovascular;  Laterality: N/A;   CORONARY STENT INTERVENTION N/A 06/22/2020   Procedure: CORONARY  STENT INTERVENTION;  Surgeon: Leonie Man, MD;  Location: [facility] [facility];  Service: Cardiovascular;  Laterality: N/A;   ICD IMPLANT N/A 12/19/2020   Procedure: ICD IMPLANT;  Surgeon: Piper Hassebrock Lance, MD;  Location: [facility];  Service: Cardiovascular;  Laterality: N/A;   INCISION AND DRAINAGE Right 02/19/2020   Procedure: INCISION AND DRAINAGE;  Surgeon: Trula Slade, DPM;  Location: [facility];  Service: Podiatry;  Laterality: Right;  Block done by surgeon   INTRAVASCULAR ULTRASOUND/IVUS N/A 06/22/2020   Procedure: Intravascular Ultrasound/IVUS;  Surgeon: Leonie Man, MD;  Location: [place] [facility];  Service: Cardiovascular;  Laterality: N/A;   IRRIGATION AND DEBRIDEMENT FOOT Left 10/10/2021   Procedure: IRRIGATION AND DEBRIDEMENT FOOT;  Surgeon: Landis Martins, DPM;  Location: [facility];  Service: Podiatry;  Laterality: Left;   IRRIGATION AND DEBRIDEMENT FOOT Left 11/23/2021   Procedure: IRRIGATION AND DEBRIDEMENT FOOT;  Surgeon: Lorenda Peck, MD;  Location: [place];  Service: Podiatry;  Laterality: Left;  will do local block   LEFT HEART CATH AND CORS/GRAFTS ANGIOGRAPHY N/A 05/23/2020   Procedure: LEFT HEART CATH AND CORS/GRAFTS ANGIOGRAPHY;  Surgeon: Nelva Bush, MD;  Location: [place] [facility];  Service: Cardiovascular;  Laterality: N/A;   LEFT HEART CATH AND CORS/GRAFTS ANGIOGRAPHY N/A 12/19/2020   Procedure: LEFT HEART CATH AND CORS/GRAFTS ANGIOGRAPHY;  Surgeon: Burnell Blanks, MD;  Location: [place] [facility];  Service: Cardiovascular;  Laterality: N/A;   LEFT HEART CATH AND CORS/GRAFTS ANGIOGRAPHY N/A 07/21/2021   Procedure: LEFT HEART CATH AND CORS/GRAFTS ANGIOGRAPHY;  Surgeon: Nelva Bush, MD;  Location: [place] [facility];  Service: Cardiovascular;  Laterality: N/A;   METATARSAL HEAD EXCISION Right 05/02/2020   Procedure: FIRST METATARSAL HEAD RESECTION; RIGHT FOOT WOUND CLOSURE;  Surgeon: Landis Martins, DPM;  Location: [facility];   Service: Podiatry;  Laterality: Right;  MAC W/LOCAL   PERIPHERAL VASCULAR BALLOON ANGIOPLASTY Left 10/11/2021   Procedure: PERIPHERAL VASCULAR BALLOON ANGIOPLASTY;  Surgeon: Marty Heck, MD;  Location: [place] [facility];  Service: Cardiovascular;  Laterality: Left;  Anterior Tibial Artery   TEE WITHOUT CARDIOVERSION N/A 11/27/2021   Procedure: TRANSESOPHAGEAL ECHOCARDIOGRAM (TEE);  Surgeon: Fay Records, MD;  Location: [facility] ENDOSCOPY;  Service: Cardiovascular;  Laterality: N/A;   TRANSMETATARSAL AMPUTATION Left 11/23/2021   Procedure: TRANSMETATARSAL AMPUTATION;  Surgeon: Lorenda Peck, MD;  Location: [place];  Service: Podiatry;  Laterality: Left;   TUBAL LIGATION      Allergies  Allergen Reactions   Vancomycin Anaphylaxis   Chlorhexidine Gluconate Itching    Received CHG bath, began itching, required benadryl   Cat Hair Extract Other (See Comments)    Sneezing, watery eyes.   Tramadol Other (See Comments)    Hallucinations   Codeine Rash    LT foot 10/24/2022  LT foot 10/24/2022   Objective/Physical Exam General: The patient is alert and oriented x3 in no acute distress.  Dermatology:  Wound stable.  It measures approximately 1.4x0.9 x 0.3 cm.  To the noted ulceration(s), there is no eschar. There is minimal slough,  fibrin, and necrotic tissue noted. Granulation tissue and wound base is red.  Negative for any appreciable drainage.  There is no exposed bone muscle-tendon ligament or joint. There is no malodor. Periwound integrity is intact.  Overall the wound appears very stable Skin is warm, dry and supple bilateral lower extremities.  Vascular: Palpable pedal pulses bilaterally. No edema or erythema noted. Capillary refill within normal limits.  Neurological: Epicritic and protective threshold diminished bilaterally.   Musculoskeletal Exam: PSxHx transmetatarsal amputation LT; healed.  Patient also has a very prominent forefoot varus deformity which increases lateral  column loading likely secondary to loss of the peroneus brevis tendon at the fifth metatarsal tubercle at the time of amputation.  This is likely the reason for the ulcer to the lateral aspect of the foot.  The forefoot is very reducible/flexible. Taught tibialis anterior tendon noted  Radiographic exam LT foot 09/26/2022: Clean osteotomy sites.  No cortical erosions, fractures noted.  Radiographically no evidence of osteomyelitis  Assessment: 1.  Ulcer left foot secondary to diabetes mellitus 2. diabetes mellitus w/ peripheral neuropathy 3.  History of transmetatarsal amputation left 4.  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 Patient was evaluated.  X-rays reviewed 2. medically necessary excisional debridement including subcutaneous tissue was performed using a tissue nipper and a chisel blade. Excisional debridement of all the necrotic nonviable tissue down to healthy bleeding viable tissue was performed with post-debridement measurements same as pre-. 3. the wound was cleansed and dry sterile dressing applied. 4.  Continue gentamicin cream daily with a light dressing 5.  After discussing with the patient that unfortunately do not believe she is getting significant improvement with conservative wound care and diabetic insoles.  She does have a very flexible forefoot and I believe a tibialis anterior tendon transfer may be very beneficial for the patient in conjunction with a tendo Achilles lengthening.  This was discussed in detail.  The patient does live in [place] and would prefer to follow-up in the [place] office postoperatively. Dr. Loel Lofty practices out of our [place] office and recommended him to the patient.  Patient's husband actually sees Dr. Loel Lofty in the [place] office. 6.  Appointment with with Dr. Loel Lofty for possible surgical consultation in the [facility] office.  Dr. Loel Lofty notified of the  patient's condition and recommendation for tendon transfer 7.  Return to clinic as per office for surgical consultation with Dr. Donnella Sham, DPM [facility]  Dr. Edrick Kins, DPM    [address], [place] [address]                Office [phone]  Fax [phone]

## 2022-11-20 ENCOUNTER — Other Ambulatory Visit: Payer: Self-pay

## 2022-11-20 DIAGNOSIS — E0865 Diabetes mellitus due to underlying condition with hyperglycemia: Secondary | ICD-10-CM

## 2022-11-20 DIAGNOSIS — Z794 Long term (current) use of insulin: Secondary | ICD-10-CM

## 2022-11-20 MED ORDER — TRESIBA FLEXTOUCH 100 UNIT/ML ~~LOC~~ SOPN: 50.0000 [IU] | PEN_INJECTOR | Freq: Every day | SUBCUTANEOUS | 3 refills | Status: DC

## 2022-11-26 ENCOUNTER — Ambulatory Visit: Payer: BC Managed Care – PPO | Admitting: Podiatry

## 2022-11-27 ENCOUNTER — Ambulatory Visit: Payer: BC Managed Care – PPO | Admitting: Podiatry

## 2022-11-27 DIAGNOSIS — E0843 Diabetes mellitus due to underlying condition with diabetic autonomic (poly)neuropathy: Secondary | ICD-10-CM

## 2022-11-27 DIAGNOSIS — R600 Localized edema: Secondary | ICD-10-CM | POA: Diagnosis not present

## 2022-11-27 DIAGNOSIS — M21172 Varus deformity, not elsewhere classified, left ankle: Secondary | ICD-10-CM

## 2022-11-27 DIAGNOSIS — L97522 Non-pressure chronic ulcer of other part of left foot with fat layer exposed: Secondary | ICD-10-CM

## 2022-11-27 NOTE — Progress Notes (Unsigned)
Subjective:  [name], female    DOB: [birth year],  MRN: [id number]  Chief Complaint  Patient presents with   Consult    Surgery Consult     61 y.o. female presents for consultation for possible tendon transfer to aid in wound healing to the left foot. She has previously undergone a transmetatarsal amputation of the left foot. Has developed a flexible varus deformity of the left foot and subsequently has had a pressure ulceration at the plantar lateral midfoot. Has not been responding to wound care.   Past Medical History:  Diagnosis Date   Abnormal EKG 10/23/2021   Abnormal mammogram of left breast 09/04/2021   Abnormal stress test 10/23/2021   Acute chest pain 05/08/2016   AICD (automatic cardioverter/defibrillator) present    St Jude/Abbott device   AKI (acute kidney injury) ([place]) 10/23/2021   Angina pectoris ([place]) 09/11/2017   Anxiety 10/23/2021   Bacteremia 11/22/2021   Breast wound, right, subsequent encounter 09/04/2021   Burping 05/08/2016   Cancer ([place])    cervical - hysterectomy   Cellulitis of right breast 10/23/2021   Cellulitis, wound, post-operative 10/09/2021   Chronic diastolic heart failure ([place]) 11/06/2016   Coronary artery disease involving native coronary artery of native heart with angina pectoris ([place]) 09/12/2015   Overview:  PCI and stent of RCA 2009, last cath 2012 with medical therapy  CABG May 2017   Demand ischemia    Diabetic foot infection ([place]) 11/21/2021   Diabetic foot ulcer ([place]) 02/16/2020   Emphysema lung ([place]) 09/11/2017   patient not aware of this dx   Essential hypertension 09/12/2015   Gangrene ([place]) 10/23/2021   GERD (gastroesophageal reflux disease) 05/08/2016   Hematoma 10/23/2021   Hiatal hernia 10/23/2021   Hyperglycemia 10/23/2021   Hyperglycemia due to type 2 diabetes mellitus ([place]) 10/23/2021   Hyperlipidemia 09/12/2015   Hyponatremia 10/23/2021   ICD (implantable cardioverter-defibrillator) in place 05/16/2021   Infection  of great toe 10/23/2021   Lactic acidosis 10/23/2021   Leukocytosis 10/23/2021   Mediastinitis 06/28/2016   Morbid obesity ([place]) 05/08/2016   Myocardial infarction [facility])    NSTEMI (non-ST elevated myocardial infarction) ([place]) 05/22/2020   Obesity (BMI 30-39.9) 10/23/2021   Perineal abscess 10/23/2021   Peripheral vascular disease ([place])    S/P CABG (coronary artery bypass graft) 06/03/2016   Overview:  The patient underwent sternal reconstruction on 06/21/16 with pec flaps for mediastinitis from a prior CABG in May 2017. On admission, she was critically ill from sepsis and had altered mental status. She was last seen in clinic on 08/09/16 at which time she was doing well.   Severe sepsis ([place]) 06/03/2016   Sinus tachycardia 05/08/2016   Tobacco use disorder 04/20/2016   Overview:  Quit in May 2017.   Type 2 diabetes mellitus with foot ulcer ([place]) 05/08/2016   Unstable angina ([place]) 05/22/2020   Ventricular tachycardia ([place]) 12/17/2020   Wound, surgical, infected 06/06/2016   Overview:  sternal    Allergies  Allergen Reactions   Vancomycin Anaphylaxis   Chlorhexidine Gluconate Itching    Received CHG bath, began itching, required benadryl   Cat Hair Extract Other (See Comments)    Sneezing, watery eyes.   Tramadol Other (See Comments)    Hallucinations   Codeine Rash    ROS: Negative except as per HPI above  Objective:  General: AAO x3, NAD  Dermatological: Ulceration present to the plantar lateral midfoot of the left foot sub cuboid. No signs of infection to  the left foot no erythema edema or drainge. Wound probes to subcutaneous fat tissue. Hyperkeratotic tissue surrouding the ulcer, approx 2 cm diamteter.   Vascular:  Dorsalis Pedis artery and Posterior Tibial artery pedal pulses are 2/4 bilateral.     Neruologic: Grossly absent to BLE  Musculoskeletal: S/p LLE TMA well healed at amputation site, varus deformity at STJ with flexible rearfoot, able to be placed into neutral with minimal  force. Equinus deformity present with decreased ankle DF rom avialable  Gait: Unassisted, Nonantalgic.     Radiographs:  Date: 09/26/22 XR the left foot Weightbearing AP/Lateral/Oblique   Findings: s/p TMA Left foot, no evidence of osteolysis at the metatarsal stump sites. No evidence OM.  Assessment:   1. Ulcer of left foot with fat layer exposed ([place])   2. Edema of left lower leg   3. Diabetes mellitus due to underlying condition with diabetic autonomic neuropathy, unspecified whether long term insulin use ([place])   4. Varus foot deformity, acquired, left      Plan:  Patient was evaluated and treated and all questions answered.  # Ulcer of the plantar lateral midfoot with fat layer exposed -Discussed with the patient conservative and surgical treatment options. -I do believe that a split or total tibialis anterior tendon transfer from the medial cuneiform to the lateral cuneiform could be effective at reducing her varus foot deformity and hopefully decreasing the pressure she is experiencing on the plantar lateral midfoot of the left foot. -Discussed that she would also benefit from a tendo Achilles lengthening or gastrocnemius recession procedure to decrease plantar forefoot pressures leading to equinus contracture. -Discussed the risk benefits and alternatives of such a procedure.  Explained that patient would need to be nonweightbearing for significant period of time roughly 4 to 6 weeks postoperatively and would likely also be casted postoperatively. -I would prefer to have the wound healed prior to proceeding with surgical intervention however this may not be feasible especially as I believe that her foot deformity is contributing to the ulceration. -I will however send referral for wound care center in [place]. -Patient states she wants additional time to think about proceeding with surgical intervention and would prefer to discuss further at next visit.  Will follow-up the ulcer  and about 3 weeks.  # Edema of the left lower extremity -Discussed with the patient that I do not suspect the edema is infectious origin as there are no local signs of infection about the ulceration. -As patient is experiencing some level of left calf pain recommend we rule out DVT with an ultrasound.  This study was ordered at this visit.  Patient understands importance of checking for DVT and to go to the emergency department if she develops shortness of breath or chest pains.  Return in about 3 weeks (around 12/18/2022) for Follow up L foot ulceraiton.          Everitt Amber, DPM [facility]

## 2022-12-16 ENCOUNTER — Other Ambulatory Visit: Payer: Self-pay | Admitting: Physician Assistant

## 2022-12-16 DIAGNOSIS — K219 Gastro-esophageal reflux disease without esophagitis: Secondary | ICD-10-CM

## 2022-12-18 ENCOUNTER — Ambulatory Visit: Payer: BC Managed Care – PPO | Admitting: Podiatry

## 2022-12-18 ENCOUNTER — Ambulatory Visit: Payer: Managed Care, Other (non HMO)

## 2022-12-18 DIAGNOSIS — E0843 Diabetes mellitus due to underlying condition with diabetic autonomic (poly)neuropathy: Secondary | ICD-10-CM

## 2022-12-18 DIAGNOSIS — I472 Ventricular tachycardia, unspecified: Secondary | ICD-10-CM

## 2022-12-18 DIAGNOSIS — M21172 Varus deformity, not elsewhere classified, left ankle: Secondary | ICD-10-CM

## 2022-12-18 DIAGNOSIS — Z89432 Acquired absence of left foot: Secondary | ICD-10-CM

## 2022-12-18 DIAGNOSIS — L97422 Non-pressure chronic ulcer of left heel and midfoot with fat layer exposed: Secondary | ICD-10-CM | POA: Diagnosis not present

## 2022-12-18 DIAGNOSIS — R600 Localized edema: Secondary | ICD-10-CM

## 2022-12-18 LAB — CUP PACEART REMOTE DEVICE CHECK
Battery Remaining Longevity: 91 mo
Battery Remaining Percentage: 81 %
Battery Voltage: 3.01 V
Brady Statistic AP VP Percent: 1 %
Brady Statistic AP VS Percent: 1.4 %
Brady Statistic AS VP Percent: 1 %
Brady Statistic AS VS Percent: 99 %
Brady Statistic RA Percent Paced: 1.4 %
Brady Statistic RV Percent Paced: 1 %
Date Time Interrogation Session: 20240102010017
HighPow Impedance: 63 Ohm
Implantable Lead Connection Status: 753985
Implantable Lead Connection Status: 753985
Implantable Lead Implant Date: 20220103
Implantable Lead Implant Date: 20220103
Implantable Lead Location: 753859
Implantable Lead Location: 753860
Implantable Pulse Generator Implant Date: 20220103
Lead Channel Impedance Value: 330 Ohm
Lead Channel Impedance Value: 390 Ohm
Lead Channel Pacing Threshold Amplitude: 0.75 V
Lead Channel Pacing Threshold Amplitude: 1 V
Lead Channel Pacing Threshold Pulse Width: 0.5 ms
Lead Channel Pacing Threshold Pulse Width: 0.5 ms
Lead Channel Sensing Intrinsic Amplitude: 12 mV
Lead Channel Sensing Intrinsic Amplitude: 4.7 mV
Lead Channel Setting Pacing Amplitude: 2 V
Lead Channel Setting Pacing Amplitude: 2.5 V
Lead Channel Setting Pacing Pulse Width: 0.5 ms
Lead Channel Setting Sensing Sensitivity: 0.5 mV
Pulse Gen Serial Number: 111037319

## 2022-12-18 NOTE — Progress Notes (Signed)
Subjective:  [name], female    DOB: 1961-10-28,  MRN: [id number]  Chief Complaint  Patient presents with   Wound Check    Ulcer to left foot     62 y.o. female presents for follow-up of left midfoot ulceration.  She has been doing wound care with antibiotic ointment and gauze dressing.  She has not yet gotten the ultrasound for DVT rule out on the left lower extremity but has not scheduled for later this week.  She denies nausea vomiting fever chills.  She is not currently taking any antibiotics.  Past Medical History:  Diagnosis Date   Abnormal EKG 10/23/2021   Abnormal mammogram of left breast 09/04/2021   Abnormal stress test 10/23/2021   Acute chest pain 05/08/2016   AICD (automatic cardioverter/defibrillator) present    St Jude/Abbott device   AKI (acute kidney injury) ([place]) 10/23/2021   Angina pectoris ([place]) 09/11/2017   Anxiety 10/23/2021   Bacteremia 11/22/2021   Breast wound, right, subsequent encounter 09/04/2021   Burping 05/08/2016   Cancer ([place])    cervical - hysterectomy   Cellulitis of right breast 10/23/2021   Cellulitis, wound, post-operative 10/09/2021   Chronic diastolic heart failure ([place]) 11/06/2016   Coronary artery disease involving native coronary artery of native heart with angina pectoris ([place]) 09/12/2015   Overview:  PCI and stent of RCA 2009, last cath 2012 with medical therapy  CABG May 2017   Demand ischemia    Diabetic foot infection ([place]) 11/21/2021   Diabetic foot ulcer ([place]) 02/16/2020   Emphysema lung ([place]) 09/11/2017   patient not aware of this dx   Essential hypertension 09/12/2015   Gangrene ([place]) 10/23/2021   GERD (gastroesophageal reflux disease) 05/08/2016   Hematoma 10/23/2021   Hiatal hernia 10/23/2021   Hyperglycemia 10/23/2021   Hyperglycemia due to type 2 diabetes mellitus ([place]) 10/23/2021   Hyperlipidemia 09/12/2015   Hyponatremia 10/23/2021   ICD (implantable cardioverter-defibrillator) in place 05/16/2021   Infection  of great toe 10/23/2021   Lactic acidosis 10/23/2021   Leukocytosis 10/23/2021   Mediastinitis 06/28/2016   Morbid obesity ([place]) 05/08/2016   Myocardial infarction [facility])    NSTEMI (non-ST elevated myocardial infarction) ([place]) 05/22/2020   Obesity (BMI 30-39.9) 10/23/2021   Perineal abscess 10/23/2021   Peripheral vascular disease ([place])    S/P CABG (coronary artery bypass graft) 06/03/2016   Overview:  The patient underwent sternal reconstruction on 06/21/16 with pec flaps for mediastinitis from a prior CABG in May 2017. On admission, she was critically ill from sepsis and had altered mental status. She was last seen in clinic on 08/09/16 at which time she was doing well.   Severe sepsis ([place]) 06/03/2016   Sinus tachycardia 05/08/2016   Tobacco use disorder 04/20/2016   Overview:  Quit in May 2017.   Type 2 diabetes mellitus with foot ulcer ([place]) 05/08/2016   Unstable angina ([place]) 05/22/2020   Ventricular tachycardia ([place]) 12/17/2020   Wound, surgical, infected 06/06/2016   Overview:  sternal    Allergies  Allergen Reactions   Vancomycin Anaphylaxis   Chlorhexidine Gluconate Itching    Received CHG bath, began itching, required benadryl   Cat Hair Extract Other (See Comments)    Sneezing, watery eyes.   Tramadol Other (See Comments)    Hallucinations   Codeine Rash    ROS: Negative except as per HPI above  Objective:  General: AAO x3, NAD  Dermatological: Ulceration present to the plantar lateral midfoot of the left foot sub  cuboid.  Upon debridement of the ulceration there is no to be healthy granular tissue present at the wound base.  There was hyperkeratotic tissue overlying.  No maceration is noted.  No malodor drainage or deep sinus tract.  No erythema.  Wound measures approximately 1 cm x 0.9 cm x 0.4 cm postdebridement.  Vascular:  Dorsalis Pedis artery and Posterior Tibial artery pedal pulses are 2/4 bilateral.     Neruologic: Grossly absent to BLE  Musculoskeletal: S/p LLE TMA  well healed at amputation site, varus deformity at STJ with flexible rearfoot, able to be placed into neutral with minimal force. Equinus deformity present with decreased ankle DF rom avialable  Gait: Unassisted, Nonantalgic.     Radiographs:  Date: 09/26/22 XR the left foot Weightbearing AP/Lateral/Oblique   Findings: s/p TMA Left foot, no evidence of osteolysis at the metatarsal stump sites. No evidence OM.  Assessment:   1. Ulcer of left heel and midfoot with fat layer exposed ([place])   2. Varus foot deformity, acquired, left   3. Edema of left lower leg   4. Diabetes mellitus due to underlying condition with diabetic autonomic neuropathy, unspecified whether long term insulin use ([place])   5. Status post transmetatarsal amputation of foot, left ([place])     Plan:  Patient was evaluated and treated and all questions answered.  # Ulcer of the plantar lateral midfoot with fat layer exposed -We discussed the etiology and factors that are a part of the wound healing process.  We also discussed the risk of infection both soft tissue and osteomyelitis from open ulceration.  Discussed the risk of limb loss if this happens or worsens. -Debridement as below. -Dressed with betadine, DSD. -Continue home dressing changes daily with betadine and dry dressing -Continue off-loading with surgical shoe. -Vascular testing deferred -Last antibiotics: No indication for antibiotics currently has no sign of infection -Imaging: x-ray reviewed, shows no signs of erosions, osteolysis, osteomyelitis or emphysema. -Recommend referral to wound care center.  Referral was placed to the  wound care center.  Patient is in agreement with this plan -Will proceed with attempting to get the ulceration to heal completely before proceeding with any type of reconstructive surgery or tendon transfer as previously discussed.  Patient will likely need a anterior tibial tendon transfer and tendo Achilles significant.     Procedure: Excisional Debridement of Wound Rationale: Removal of non-viable soft tissue from the wound to promote healing.  Anesthesia: none Post-Debridement Wound Measurements: 1 cm x 0.9 cm x 0.4 cm  Type of Debridement: Sharp Excisional Tissue Removed: Non-viable soft tissue Depth of Debridement: subcutaneous tissue. Technique: Sharp excisional debridement to bleeding, viable wound base.  Dressing: Dry, sterile, compression dressing. Disposition: Patient tolerated procedure well.    # Edema of the left lower extremity -Again discussed need for DVT rule out ultrasound of the left lower extremity -Patient is still having pain and swelling in the left calf. -Patient has the ultrasound scheduled for December 20, 2022 at 4 PM at the [facility] heart care center and this was conveyed to her and she is aware.  Return in about 4 weeks (around 01/15/2023) for F/u L midfoot ulceration.          Everitt Amber, DPM [facility]

## 2022-12-20 ENCOUNTER — Ambulatory Visit: Payer: BC Managed Care – PPO | Attending: Cardiology

## 2022-12-20 DIAGNOSIS — R600 Localized edema: Secondary | ICD-10-CM

## 2022-12-26 ENCOUNTER — Other Ambulatory Visit: Payer: Self-pay | Admitting: Physician Assistant

## 2022-12-26 ENCOUNTER — Telehealth: Payer: Self-pay

## 2022-12-26 ENCOUNTER — Other Ambulatory Visit: Payer: Self-pay

## 2022-12-26 MED ORDER — HUMALOG KWIKPEN 100 UNIT/ML ~~LOC~~ SOPN: 10.0000 [IU] | PEN_INJECTOR | Freq: Every day | SUBCUTANEOUS | 12 refills | Status: DC

## 2022-12-26 MED ORDER — TOUJEO MAX SOLOSTAR 300 UNIT/ML ~~LOC~~ SOPN: 50.0000 [IU] | PEN_INJECTOR | Freq: Every day | SUBCUTANEOUS | 1 refills | Status: DC

## 2022-12-26 NOTE — Telephone Encounter (Signed)
Patient called and wanted to know if something else could be call in her insurance does not over tresiba but will cover toujeo and lantus. Please advise Medication will need to be sent to mailing pharmacy

## 2022-12-28 ENCOUNTER — Telehealth: Payer: Self-pay | Admitting: *Deleted

## 2022-12-28 NOTE — Telephone Encounter (Signed)
Patient calling for the status of wound care referral to [facility] in [place]. Called and said that they will contact patient today for scheduling, updated the patient.

## 2023-01-01 DIAGNOSIS — L89893 Pressure ulcer of other site, stage 3: Secondary | ICD-10-CM | POA: Diagnosis not present

## 2023-01-05 ENCOUNTER — Other Ambulatory Visit: Payer: Self-pay | Admitting: Physician Assistant

## 2023-01-05 DIAGNOSIS — E0865 Diabetes mellitus due to underlying condition with hyperglycemia: Secondary | ICD-10-CM

## 2023-01-05 DIAGNOSIS — Z794 Long term (current) use of insulin: Secondary | ICD-10-CM

## 2023-01-08 DIAGNOSIS — L89893 Pressure ulcer of other site, stage 3: Secondary | ICD-10-CM | POA: Diagnosis not present

## 2023-01-08 DIAGNOSIS — L97522 Non-pressure chronic ulcer of other part of left foot with fat layer exposed: Secondary | ICD-10-CM | POA: Diagnosis not present

## 2023-01-10 ENCOUNTER — Other Ambulatory Visit: Payer: Self-pay

## 2023-01-10 DIAGNOSIS — E0865 Diabetes mellitus due to underlying condition with hyperglycemia: Secondary | ICD-10-CM

## 2023-01-10 MED ORDER — TOUJEO MAX SOLOSTAR 300 UNIT/ML ~~LOC~~ SOPN: 50.0000 [IU] | PEN_INJECTOR | Freq: Every day | SUBCUTANEOUS | 1 refills | Status: DC

## 2023-01-11 ENCOUNTER — Ambulatory Visit: Payer: BC Managed Care – PPO | Admitting: Physician Assistant

## 2023-01-15 DIAGNOSIS — L97522 Non-pressure chronic ulcer of other part of left foot with fat layer exposed: Secondary | ICD-10-CM | POA: Diagnosis not present

## 2023-01-15 DIAGNOSIS — L89893 Pressure ulcer of other site, stage 3: Secondary | ICD-10-CM | POA: Diagnosis not present

## 2023-01-18 NOTE — Progress Notes (Signed)
Remote ICD transmission.   

## 2023-01-23 DIAGNOSIS — L89893 Pressure ulcer of other site, stage 3: Secondary | ICD-10-CM | POA: Diagnosis not present

## 2023-01-23 DIAGNOSIS — L97522 Non-pressure chronic ulcer of other part of left foot with fat layer exposed: Secondary | ICD-10-CM | POA: Diagnosis not present

## 2023-01-30 DIAGNOSIS — L97522 Non-pressure chronic ulcer of other part of left foot with fat layer exposed: Secondary | ICD-10-CM | POA: Diagnosis not present

## 2023-01-30 DIAGNOSIS — L89893 Pressure ulcer of other site, stage 3: Secondary | ICD-10-CM | POA: Diagnosis not present

## 2023-02-05 ENCOUNTER — Ambulatory Visit (INDEPENDENT_AMBULATORY_CARE_PROVIDER_SITE_OTHER): Payer: BC Managed Care – PPO | Admitting: Podiatry

## 2023-02-05 DIAGNOSIS — Z91199 Patient's noncompliance with other medical treatment and regimen due to unspecified reason: Secondary | ICD-10-CM

## 2023-02-05 NOTE — Progress Notes (Signed)
Pt was a no show for apt, charge generated

## 2023-02-06 DIAGNOSIS — L89893 Pressure ulcer of other site, stage 3: Secondary | ICD-10-CM | POA: Diagnosis not present

## 2023-02-06 DIAGNOSIS — L97522 Non-pressure chronic ulcer of other part of left foot with fat layer exposed: Secondary | ICD-10-CM | POA: Diagnosis not present

## 2023-02-13 DIAGNOSIS — L89893 Pressure ulcer of other site, stage 3: Secondary | ICD-10-CM | POA: Diagnosis not present

## 2023-02-13 DIAGNOSIS — L97522 Non-pressure chronic ulcer of other part of left foot with fat layer exposed: Secondary | ICD-10-CM | POA: Diagnosis not present

## 2023-02-19 ENCOUNTER — Other Ambulatory Visit: Payer: Self-pay | Admitting: Physician Assistant

## 2023-02-19 DIAGNOSIS — E0865 Diabetes mellitus due to underlying condition with hyperglycemia: Secondary | ICD-10-CM

## 2023-02-20 DIAGNOSIS — L89893 Pressure ulcer of other site, stage 3: Secondary | ICD-10-CM | POA: Diagnosis not present

## 2023-02-26 NOTE — Progress Notes (Unsigned)
Cardiology Office Note:    Date:  02/27/2023   ID:  Jasmine Richardson, DOB 10-26-1961, MRN [id number]  PCP:  Marge Duncans, PA-C  Cardiologist:  Shirlee More, MD    Referring MD: Marge Duncans, PA-C    ASSESSMENT:    1. Coronary artery disease involving native coronary artery of native heart with angina pectoris ([place])   2. S/P CABG (coronary artery bypass graft)   3. Ventricular tachycardia ([place])   4. On amiodarone therapy   5. ICD (implantable cardioverter-defibrillator) in place   6. Hypertensive heart disease with chronic diastolic congestive heart failure ([place])   7. Mixed hyperlipidemia   8. PAD (peripheral artery disease) (HCC)    PLAN:    In order of problems listed above:  Overall doing well stable CAD following surgical very complex percutaneous intervention having no angina and continue medical therapy at this time including combined dual antiplatelet therapy Recheck echocardiogram regarding ejection fraction Stable ICD followed by device clinic Heart failure is well compensated continue current diuretic along with SGLT2 inhibitor beta-blocker valsartan Continue PCSK9 inhibitor statin check lipid profile Continues in wound clinic   Next appointment: 6 months   Medication Adjustments/Labs and Tests Ordered: Current medicines are reviewed at length with the patient today.  Concerns regarding medicines are outlined above.  Orders Placed This Encounter  Procedures   Comp Met (CMET)   Lipid Profile   Pro b natriuretic peptide (BNP)   EKG 12-Lead   No orders of the defined types were placed in this encounter.   Chief complaint routine cardiology follow-up   History of Present Illness:    Jasmine Richardson is a 62 y.o. female with a hx of CAD with CABG 0000000 complicated by postoperative mediastinitis and reoperation [facility] with myocutaneous pectoralis flap closure ACS and PCI and stent native right coronary artery June 2021 with occlusion of vein graft to the  right coronary artery subsequent complex PCI and stent of LAD with lithotripsy February 2002 ventricular tachycardia suppressed with amiodarone and ICD type 2 diabetes hyperlipidemia hypertensive heart disease with chronic diastolic heart failure and peripheral arterial disease with transmetatarsal amputation last seen 07/05/2022.  Compliance with diet, lifestyle and medications: Yes presently she is out of [place] we will renew today  Over well is doing well no angina edema shortness of breath chest pain palpitation or syncope Frustrated that she still has a pressure wound to the left foot continues to go to wound care center And tolerates her anticoagulant without bleeding Past Medical History:  Diagnosis Date   Abnormal EKG 10/23/2021   Abnormal mammogram of left breast 09/04/2021   Abnormal stress test 10/23/2021   Acute chest pain 05/08/2016   AICD (automatic cardioverter/defibrillator) present    St Jude/Abbott device   AKI (acute kidney injury) ([place]) 10/23/2021   Angina pectoris ([place]) 09/11/2017   Anxiety 10/23/2021   Bacteremia 11/22/2021   Breast wound, right, subsequent encounter 09/04/2021   Burping 05/08/2016   Cancer ([place])    cervical - hysterectomy   Cellulitis of right breast 10/23/2021   Cellulitis, wound, post-operative 10/09/2021   Chronic diastolic heart failure ([place]) 11/06/2016   Coronary artery disease involving native coronary artery of native heart with angina pectoris ([place]) 09/12/2015   Overview:  PCI and stent of RCA 2009, last cath 2012 with medical therapy  CABG May 2017   Demand ischemia    Diabetic foot infection ([place]) 11/21/2021   Diabetic foot ulcer ([place]) 02/16/2020   Emphysema lung ([place]) 09/11/2017  patient not aware of this dx   Essential hypertension 09/12/2015   Gangrene ([employer]) 10/23/2021   GERD (gastroesophageal reflux disease) 05/08/2016   Hematoma 10/23/2021   Hiatal hernia 10/23/2021   Hyperglycemia 10/23/2021   Hyperglycemia due to type 2 diabetes  mellitus ([place]) 10/23/2021   Hyperlipidemia 09/12/2015   Hyponatremia 10/23/2021   ICD (implantable cardioverter-defibrillator) in place 05/16/2021   Infection of great toe 10/23/2021   Lactic acidosis 10/23/2021   Leukocytosis 10/23/2021   Mediastinitis 06/28/2016   Morbid obesity ([place]) 05/08/2016   Myocardial infarction [facility])    NSTEMI (non-ST elevated myocardial infarction) ([place]) 05/22/2020   Obesity (BMI 30-39.9) 10/23/2021   Perineal abscess 10/23/2021   Peripheral vascular disease ([place])    S/P CABG (coronary artery bypass graft) 06/03/2016   Overview:  The patient underwent sternal reconstruction on 06/21/16 with pec flaps for mediastinitis from a prior CABG in May 2017. On admission, she was critically ill from sepsis and had altered mental status. She was last seen in clinic on 08/09/16 at which time she was doing well.   Severe sepsis ([place]) 06/03/2016   Sinus tachycardia 05/08/2016   Tobacco use disorder 04/20/2016   Overview:  Quit in May 2017.   Type 2 diabetes mellitus with foot ulcer ([place]) 05/08/2016   Unstable angina ([place]) 05/22/2020   Ventricular tachycardia ([place]) 12/17/2020   Wound, surgical, infected 06/06/2016   Overview:  sternal    Past Surgical History:  Procedure Laterality Date   ABDOMINAL AORTOGRAM W/LOWER EXTREMITY N/A 10/11/2021   Procedure: ABDOMINAL AORTOGRAM W/LOWER EXTREMITY;  Surgeon: Marty Heck, MD;  Location: [place] [facility];  Service: Cardiovascular;  Laterality: N/A;   AMPUTATION TOE Right 02/20/2020   Procedure: AMPUTATION RIGHT GREAT  TOE;  Surgeon: Felipa Furnace, DPM;  Location: [place];  Service: Podiatry;  Laterality: Right;   BLADDER SURGERY     BONE BIOPSY Left 02/01/2022   Procedure: BONE BIOPSY LEFT FOOT, IRRIGATION AND DEBRIDEMENT;  Surgeon: Landis Martins, DPM;  Location: [place];  Service: Podiatry;  Laterality: Left;   BUBBLE STUDY  11/27/2021   Procedure: BUBBLE STUDY;  Surgeon: Fay Records, MD;  Location: [place];  Service:  Cardiovascular;;   CARDIAC CATHETERIZATION     CORONARY ARTERY BYPASS GRAFT     CORONARY STENT INTERVENTION N/A 05/25/2020   Procedure: CORONARY STENT INTERVENTION;  Surgeon: Wellington Hampshire, MD;  Location: [place] [facility];  Service: Cardiovascular;  Laterality: N/A;   CORONARY STENT INTERVENTION N/A 06/22/2020   Procedure: CORONARY STENT INTERVENTION;  Surgeon: Leonie Man, MD;  Location: [place] [facility];  Service: Cardiovascular;  Laterality: N/A;   ICD IMPLANT N/A 12/19/2020   Procedure: ICD IMPLANT;  Surgeon: Evans Lance, MD;  Location: [place] [facility];  Service: Cardiovascular;  Laterality: N/A;   INCISION AND DRAINAGE Right 02/19/2020   Procedure: INCISION AND DRAINAGE;  Surgeon: Trula Slade, DPM;  Location: [place];  Service: Podiatry;  Laterality: Right;  Block done by surgeon   INTRAVASCULAR ULTRASOUND/IVUS N/A 06/22/2020   Procedure: Intravascular Ultrasound/IVUS;  Surgeon: Leonie Man, MD;  Location: [place] [facility];  Service: Cardiovascular;  Laterality: N/A;   IRRIGATION AND DEBRIDEMENT FOOT Left 10/10/2021   Procedure: IRRIGATION AND DEBRIDEMENT FOOT;  Surgeon: Landis Martins, DPM;  Location: [place];  Service: Podiatry;  Laterality: Left;   IRRIGATION AND DEBRIDEMENT FOOT Left 11/23/2021   Procedure: IRRIGATION AND DEBRIDEMENT FOOT;  Surgeon: Lorenda Peck, MD;  Location: [place];  Service: Podiatry;  Laterality: Left;  will do local block   LEFT HEART CATH AND CORS/GRAFTS ANGIOGRAPHY N/A 05/23/2020   Procedure: LEFT HEART CATH AND CORS/GRAFTS ANGIOGRAPHY;  Surgeon: Nelva Bush, MD;  Location: [place] [facility];  Service: Cardiovascular;  Laterality: N/A;   LEFT HEART CATH AND CORS/GRAFTS ANGIOGRAPHY N/A 12/19/2020   Procedure: LEFT HEART CATH AND CORS/GRAFTS ANGIOGRAPHY;  Surgeon: Burnell Blanks, MD;  Location: [place] [facility];  Service: Cardiovascular;  Laterality: N/A;   LEFT HEART CATH AND CORS/GRAFTS ANGIOGRAPHY N/A 07/21/2021   Procedure:  LEFT HEART CATH AND CORS/GRAFTS ANGIOGRAPHY;  Surgeon: Nelva Bush, MD;  Location: [place] [facility];  Service: Cardiovascular;  Laterality: N/A;   METATARSAL HEAD EXCISION Right 05/02/2020   Procedure: FIRST METATARSAL HEAD RESECTION; RIGHT FOOT WOUND CLOSURE;  Surgeon: Landis Martins, DPM;  Location: [place];  Service: Podiatry;  Laterality: Right;  MAC W/LOCAL   PERIPHERAL VASCULAR BALLOON ANGIOPLASTY Left 10/11/2021   Procedure: PERIPHERAL VASCULAR BALLOON ANGIOPLASTY;  Surgeon: Marty Heck, MD;  Location: [place] [facility];  Service: Cardiovascular;  Laterality: Left;  Anterior Tibial Artery   TEE WITHOUT CARDIOVERSION N/A 11/27/2021   Procedure: TRANSESOPHAGEAL ECHOCARDIOGRAM (TEE);  Surgeon: Fay Records, MD;  Location: [place] [facility];  Service: Cardiovascular;  Laterality: N/A;   TRANSMETATARSAL AMPUTATION Left 11/23/2021   Procedure: TRANSMETATARSAL AMPUTATION;  Surgeon: Lorenda Peck, MD;  Location: [place];  Service: Podiatry;  Laterality: Left;   TUBAL LIGATION      Current Medications: Current Meds  Medication Sig   acetaminophen (TYLENOL) 325 MG tablet Take 2 tablets (650 mg total) by mouth every 4 (four) hours as needed for headache or mild pain.   amiodarone (PACERONE) 200 MG tablet Take 1 tablet (200 mg total) by mouth daily.   aspirin 81 MG chewable tablet Chew 1 tablet by mouth daily.   clopidogrel (PLAVIX) 75 MG tablet Take 1 tablet (75 mg total) by mouth daily.   dapagliflozin propanediol (FARXIGA) 10 MG TABS tablet Take 1 tablet by mouth once daily   Evolocumab (REPATHA SURECLICK) [id number] MG/ML SOAJ Inject 140 mg into the skin every 14 (fourteen) days.   fenofibrate (TRICOR) 145 MG tablet Take 1 tablet (145 mg total) by mouth daily.   furosemide (LASIX) 20 MG tablet Take 1 tablet (20 mg total) by mouth daily.   gentamicin cream (GARAMYCIN) 0.1 % Apply 1 Application topically 2 (two) times daily.   HUMALOG KWIKPEN 100 UNIT/ML KwikPen Inject  10-50 Units into the skin at bedtime. Sliding scale   insulin glargine, 2 Unit Dial, (TOUJEO MAX SOLOSTAR) 300 UNIT/ML Solostar Pen Inject 50 Units into the skin daily.   LORazepam (ATIVAN) 1 MG tablet Take 1 tablet (1 mg total) by mouth as needed for anxiety.   metFORMIN (GLUCOPHAGE) 500 MG tablet TAKE 1 TABLET BY MOUTH TWICE DAILY WITH A MEAL   metoprolol succinate (TOPROL-XL) 25 MG 24 hr tablet Take 1 tablet (25 mg total) by mouth in the morning, at noon, and at bedtime.   nitroGLYCERIN (NITROSTAT) 0.4 MG SL tablet Place 1 tablet (0.4 mg total) under the tongue every 5 (five) minutes as needed for chest pain.   nystatin (MYCOSTATIN/NYSTOP) powder Apply 1 Application topically daily as needed (rash/yeast).   Omega-3 Fatty Acids (FISH OIL) 1000 MG CAPS Take by mouth.   omeprazole (PRILOSEC) 20 MG capsule Take 1 capsule by mouth once daily   promethazine (PHENERGAN) 25 MG tablet Take 25 mg by mouth every 6 (six)  hours as needed for nausea or vomiting.   rosuvastatin (CRESTOR) 20 MG tablet Take 1 tablet (20 mg total) by mouth daily.   senna-docusate (SENOKOT-S) 8.6-50 MG tablet Take 1 tablet by mouth daily.   valsartan (DIOVAN) 80 MG tablet Take 1 tablet (80 mg total) by mouth 2 (two) times daily.   vitamin C (ASCORBIC ACID) 500 MG tablet Take 500 mg by mouth daily.   Vitamin D, Ergocalciferol, (DRISDOL) 1.25 MG (50000 UNIT) CAPS capsule Take 1 capsule (50,000 Units total) by mouth every Saturday.     Allergies:   Vancomycin, Chlorhexidine gluconate, Cat hair extract, Tramadol, and Codeine   Social History   Socioeconomic History   Marital status: Married    Spouse name: Not on file   Number of children: Not on file   Years of education: Not on file   Highest education level: Not on file  Occupational History   Not on file  Tobacco Use   Smoking status: Former    Types: Cigarettes    Quit date: 05/2016    Years since quitting: 6.7   Smokeless tobacco: Never  Vaping Use   Vaping  Use: Never used  Substance and Sexual Activity   Alcohol use: Yes    Comment: social wine   Drug use: No   Sexual activity: Yes    Birth control/protection: Surgical    Comment: Hysterectomy  Other Topics Concern   Not on file  Social History Narrative   Not on file   Social Determinants of Health   Financial Resource Strain: Not on file  Food Insecurity: Not on file  Transportation Needs: Not on file  Physical Activity: Not on file  Stress: Not on file  Social Connections: Not on file     Family History: The patient's family history includes Alzheimer's disease in her father; Heart attack in her father; Heart disease in her father; Hyperlipidemia in her brother and mother; Hypertension in her brother, father, and mother. ROS:   Please see the history of present illness.    All other systems reviewed and are negative.  EKGs/Labs/Other Studies Reviewed:    The following studies were reviewed today:  Cardiac Studies & Procedures   CARDIAC CATHETERIZATION  CARDIAC CATHETERIZATION 07/21/2021  Narrative Conclusions: Severe native coronary artery disease, as detailed below, not significantly changed from prior catheterization in 12/2020. Widely patent LIMA-LAD graft and LMCA stent. Patent mid RCA stents with moderate restenosis (~40%). Moderately reduced left ventricular systolic function (LVEF [id number]). Upper normal left ventricular filling pressure (LVEDP 15 mmHg).  Recommendations: Escalate antianginal therapy; no focal targets again noted on today's catheterization. Aggressive secondary prevention.  Nelva Bush, MD [facility]  Findings Coronary Findings Diagnostic  Dominance: Right  Left Main Vessel is large. Non-stenotic Ost LM to Mid LM lesion was previously treated.  Left Anterior Descending Vessel is moderate in size. Ost LAD to Mid LAD lesion is 50% stenosed. Mid LAD lesion is 100% stenosed. The lesion is chronically occluded.  First Diagonal  Branch Vessel is small in size.  Second Diagonal Branch Vessel is moderate in size. 2nd Diag lesion is 80% stenosed.  Left Circumflex Vessel is moderate in size. Prox Cx to Mid Cx lesion is 90% stenosed.  First Obtuse Marginal Branch Vessel is small in size. 1st Mrg-1 lesion is 90% stenosed. 1st Mrg-2 lesion is 100% stenosed. The lesion is chronically occluded.  Second Obtuse Marginal Branch Vessel is small in size.  Right Coronary Artery Vessel is large. Prox RCA lesion  is 30% stenosed. Mid RCA lesion is 40% stenosed. The lesion was previously treated using a drug eluting stent between 6-12 months ago.  Right Posterior Descending Artery Collaterals RPDA filled by collaterals from 1st RPL.  Collaterals RPDA filled by collaterals from 2nd RPL.  RPDA lesion is 100% stenosed. The lesion is chronically occluded with right-to-right collateral flow.  Right Posterior Atrioventricular Artery Vessel is moderate in size. RPAV lesion is 90% stenosed.  First Right Posterolateral Branch Vessel is small in size.  Second Right Posterolateral Branch Vessel is small in size.  Third Right Posterolateral Branch Vessel is moderate in size.  LIMA Graft To 2nd Diag LIMA. Origin lesion is 100% stenosed. The lesion is chronically occluded.  Saphenous Graft To 1st Mrg SVG. Origin lesion is 100% stenosed. The lesion is chronically occluded.  Saphenous Graft To RPDA SVG. Origin lesion is 100% stenosed. The lesion is chronically occluded.  LIMA LIMA Graft To Mid LAD LIMA and is normal in caliber.  Intervention  No interventions have been documented.   CARDIAC CATHETERIZATION  CARDIAC CATHETERIZATION 12/19/2020  Narrative  2nd Diag lesion is 80% stenosed.  Mid LAD lesion is 100% stenosed.  Ost LAD to Mid LAD lesion is 50% stenosed.  1st Mrg-1 lesion is 90% stenosed.  1st Mrg-2 lesion is 100% stenosed.  Prox RCA lesion is 30% stenosed.  RPDA lesion is 100%  stenosed.  RPAV lesion is 90% stenosed.  LIMA graft was not injected.  Origin lesion is 100% stenosed.  SVG graft was not injected.  Origin lesion is 100% stenosed.  SVG graft was not injected.  Origin lesion is 100% stenosed.  LIMA graft was visualized by angiography and is normal in caliber.  Mid RCA lesion is 40% stenosed.  Previously placed Ost LM to Mid LM stent (unknown type) is widely patent.  Ost Cx to Prox Cx lesion is 99% stenosed.  1. Severe triple vessel CAD s/p CABG with patent LIMA graft but 3 known occluded vein grafts 2. Patent left main stent without restenosis. 3. Chronic occlusion of the mid LAD. The mid and distal LAD fills from the patent LIMA graft. The proximal LAD is patent and fills two small caliber diagonal branches. Both diagonal branches have disease but given small caliber of vessels, not favorable for PCI. The vein graft to the diagonal is known to be occluded. 4. The Circumflex is overall small in caliber with diffuse severe proximal disease. The graft to the OM is known to be occluded. 5. The RCA is a large dominant vessel. The vessel is patent. The mid to distal stented segment is patent. The proximal segment of the stent has mild restenosis. There is a severe stenosis in the small caliber posterolateral artery, unchanged from last cath and too small for PCI. Known occlusion of the SVG to the PDA. The PDA is chronically occluded.  Recommendations: No focal targets for PCI. Continue medical management of CAD. EP to see today to discuss ICD.  Findings Coronary Findings Diagnostic  Dominance: Right  Left Main Vessel is large. Previously placed Ost LM to Mid LM stent (unknown type) is widely patent.  Left Anterior Descending Vessel is moderate in size. Ost LAD to Mid LAD lesion is 50% stenosed. Mid LAD lesion is 100% stenosed. The lesion is chronically occluded.  First Diagonal Branch Vessel is small in size.  Second Diagonal  Branch Vessel is moderate in size. 2nd Diag lesion is 80% stenosed.  Left Circumflex Vessel is moderate in size. Ost Cx to  Prox Cx lesion is 99% stenosed.  First Obtuse Marginal Branch Vessel is small in size. 1st Mrg-1 lesion is 90% stenosed. 1st Mrg-2 lesion is 100% stenosed. The lesion is chronically occluded.  Second Obtuse Marginal Branch Vessel is small in size.  Right Coronary Artery Vessel is large. Prox RCA lesion is 30% stenosed. Mid RCA lesion is 40% stenosed. The lesion was previously treated using a drug eluting stent between 6-12 months ago.  Right Posterior Descending Artery RPDA lesion is 100% stenosed. The lesion is chronically occluded with right-to-right collateral flow.  Right Posterior Atrioventricular Artery Vessel is moderate in size. RPAV lesion is 90% stenosed.  First Right Posterolateral Branch Vessel is small in size.  Second Right Posterolateral Branch Vessel is small in size.  Third Right Posterolateral Branch Vessel is moderate in size.  LIMA Graft To 2nd Diag LIMA graft was not injected. Origin lesion is 100% stenosed. The lesion is chronically occluded.  Saphenous Graft To 1st Mrg SVG graft was not injected. Origin lesion is 100% stenosed. The lesion is chronically occluded.  Saphenous Graft To RPDA SVG graft was not injected. Origin lesion is 100% stenosed. The lesion is chronically occluded.  LIMA LIMA Graft To Mid LAD LIMA graft was visualized by angiography and is normal in caliber.  Intervention  No interventions have been documented.     ECHOCARDIOGRAM  ECHOCARDIOGRAM COMPLETE 11/23/2021  Narrative ECHOCARDIOGRAM REPORT    Patient Name:   Jasmine Richardson Date of Exam: 11/23/2021 Medical Rec #:  [id number]     Height:       67.0 in Accession #:    [id number]    Weight:       231.0 lb Date of Birth:  1961/08/21    BSA:          2.150 m Patient Age:    75 years      BP:           149/90 mmHg Patient Gender: F              HR:           84 bpm. Exam Location:  Inpatient  Procedure: 2D Echo, Cardiac Doppler, Color Doppler and Intracardiac Opacification Agent  Indications:    Bacteremia  History:        Patient has prior history of Echocardiogram examinations, most recent 12/18/2020. CAD, Arrythmias:Tachycardia; Risk Factors:Hypertension, Dyslipidemia and Diabetes.  Sonographer:    Bernadene Person RDCS Referring Phys: [id number] [facility]   1. Left ventricular ejection fraction, by estimation, is 50 to 55%. The left ventricle has low normal function. The LV endocardium is incompletely visualized and it is difficult to assess wall motion. Based on available views, the mid-to-apical inferior and apical inferoseptal walls are mildly hypokinetic. There is mild concentric left ventricular hypertrophy. Left ventricular diastolic parameters are consistent with Grade I diastolic dysfunction (impaired relaxation). 2. Right ventricular systolic function is normal. The right ventricular size is normal. There is normal pulmonary artery systolic pressure. 3. The mitral valve is normal in structure. Trivial mitral valve regurgitation. No evidence of mitral stenosis. 4. The aortic valve is tricuspid. There is mild-to-moderate calcification of the aortic valve. There is mild thickening of the aortic valve. Aortic valve regurgitation is not visualized. Aortic valve sclerosis/calcification is present, without any evidence of aortic stenosis. 5. There is a small, highly echogenic, well circumscribed mass measuring 0.6x1.0cm on the [place]aut most consistent with asymmetric calcification. This was also present on prior study and unlikely  to represent a vegetation. 6. No valvular vegetations visualized. Consider TEE to exclude infective endocarditis if clinically indicated.  Conclusion(s)/Recommendation(s): No evidence of valvular vegetations on this transthoracic echocardiogram. Consider a transesophageal echocardiogram  to exclude infective endocarditis if clinically indicated.  FINDINGS Left Ventricle: Left ventricular ejection fraction, by estimation, is 50 to 55%. The left ventricle has low normal function. The left ventricle demonstrates regional wall motion abnormalities. The mid-to-apical inferior and apical inferoseptal walls are mildly hypokinetic. The left ventricular internal cavity size was normal in size. There is mild concentric left ventricular hypertrophy. Left ventricular diastolic parameters are consistent with Grade I diastolic dysfunction (impaired relaxation).  Right Ventricle: The right ventricular size is normal. No increase in right ventricular wall thickness. Right ventricular systolic function is normal. There is normal pulmonary artery systolic pressure. The tricuspid regurgitant velocity is 2.10 m/s, and with an assumed right atrial pressure of 8 mmHg, the estimated right ventricular systolic pressure is 0000000 mmHg.  Left Atrium: Left atrial size was normal in size.  Right Atrium: Right atrial size was normal in size.  Pericardium: There is no evidence of pericardial effusion.  Mitral Valve: The mitral valve is normal in structure. Mild mitral annular calcification. Trivial mitral valve regurgitation. No evidence of mitral valve stenosis. There is no evidence of mitral valve vegetation.  Tricuspid Valve: The tricuspid valve is normal in structure. Tricuspid valve regurgitation is trivial. There is no evidence of tricuspid valve vegetation.  Aortic Valve: The aortic valve is tricuspid. There is mild-to-moderate calcification of the aortic valve. There is mild thickening of the aortic valve. Aortic valve regurgitation is not visualized. Aortic valve sclerosis/calcification is present, without any evidence of aortic stenosis.  Pulmonic Valve: The pulmonic valve was normal in structure. Pulmonic valve regurgitation is trivial. There is no evidence of pulmonic valve vegetation.  Aorta:  The aortic root and ascending aorta are structurally normal, with no evidence of dilitation.  Venous: The inferior vena cava was not well visualized.  IAS/Shunts: No atrial level shunt detected by color flow Doppler.   LEFT VENTRICLE PLAX 2D LVIDd:         3.80 cm   Diastology LVIDs:         2.40 cm   LV e' medial:    5.34 cm/s LV PW:         1.40 cm   LV E/e' medial:  15.4 LV IVS:        1.20 cm   LV e' lateral:   8.15 cm/s LVOT diam:     2.10 cm   LV E/e' lateral: 10.1 LV SV:         64 LV SV Index:   30 LVOT Area:     3.46 cm   RIGHT VENTRICLE RV S prime:     4.78 cm/s TAPSE (M-mode): 1.2 cm  LEFT ATRIUM             Index        RIGHT ATRIUM           Index LA diam:        3.70 cm 1.72 cm/m   RA Area:     15.20 cm LA Vol (A2C):   42.7 ml 19.86 ml/m  RA Volume:   36.60 ml  17.02 ml/m LA Vol (A4C):   48.0 ml 22.33 ml/m LA Biplane Vol: 46.0 ml 21.40 ml/m AORTIC VALVE LVOT Vmax:   112.00 cm/s LVOT Vmean:  70.000 cm/s LVOT VTI:    0.184  m  AORTA Ao Root diam: 3.30 cm Ao Asc diam:  3.10 cm  MITRAL VALVE                TRICUSPID VALVE MV Area (PHT): 5.31 cm     TR Peak grad:   17.6 mmHg MV Decel Time: 143 msec     TR Vmax:        210.00 cm/s MV E velocity: 82.00 cm/s MV A velocity: 104.00 cm/s  SHUNTS MV E/A ratio:  0.79         Systemic VTI:  0.18 m Systemic Diam: 2.10 cm  Gwyndolyn Kaufman MD Electronically signed by Gwyndolyn Kaufman MD Signature Date/Time: 11/23/2021/4:40:43 PM    Final   TEE  ECHO TEE 11/27/2021  Narrative TRANSESOPHOGEAL ECHO REPORT    Patient Name:   Darleen Crocker Date of Exam: 11/27/2021 Medical Rec #:  [id number]     Height:       67.0 in Accession #:    [id number]    Weight:       233.5 lb Date of Birth:  [birth year]    BSA:          2.160 m Patient Age:    79 years      BP:           159/94 mmHg Patient Gender: F             HR:           84 bpm. Exam Location:  Inpatient  Procedure: Transesophageal Echo, 3D Echo and  Color Doppler  MODIFIED REPORT: This report was modified by Dorris Carnes MD on 11/27/2021 due to complete. Indications:     Bacteremia  History:         Patient has prior history of Echocardiogram examinations, most recent 11/23/2021. CHF, Prior CABG and Defibrillator; Risk Factors:Diabetes and Dyslipidemia.  Sonographer:     Raquel Sarna Senior RDCS Referring Phys:  [id number] Leanor Kail Diagnosing Phys: Dorris Carnes MD  PROCEDURE: After discussion of the risks and benefits of a TEE, an informed consent was obtained from the patient. The transesophogeal probe was passed without difficulty through the esophogus of the patient. Local oropharyngeal anesthetic was provided with viscous lidocaine. Sedation performed by different physician. The patient was monitored while under deep sedation. Anesthestetic sedation was provided intravenously by Anesthesiology: '527mg'$  of Propofol, '40mg'$  of Lidocaine. The patient developed no complications during the procedure.  IMPRESSIONS   1. Device lead seen in RA, RV without obvious vegetations. 2. No obvious vegetations seen. 3. Left ventricular ejection fraction, by estimation, is 50 to 55%. The left ventricle has low normal function. 4. Right ventricular systolic function is normal. The right ventricular size is normal. 5. No left atrial/left atrial appendage thrombus was detected. 6. The mitral valve is normal in structure. Trivial mitral valve regurgitation. 7. The aortic valve is tricuspid. Aortic valve regurgitation is not visualized. Aortic valve sclerosis is present, with no evidence of aortic valve stenosis. 8. Evidence of atrial level shunting detected by color flow Doppler. Agitated saline contrast bubble study was positive with shunting observed within 3-6 cardiac cycles suggestive of interatrial shunt.  FINDINGS Left Ventricle: Left ventricular ejection fraction, by estimation, is 50 to 55%. The left ventricle has low normal function. The left  ventricular internal cavity size was normal in size.  Right Ventricle: The right ventricular size is normal. Right vetricular wall thickness was not assessed. Right ventricular systolic function is normal.  Left Atrium: Left atrial size  was normal in size. No left atrial/left atrial appendage thrombus was detected.  Right Atrium: Right atrial size was normal in size.  Pericardium: There is no evidence of pericardial effusion.  Mitral Valve: The mitral valve is normal in structure. Trivial mitral valve regurgitation.  Tricuspid Valve: The tricuspid valve is normal in structure. Tricuspid valve regurgitation is trivial.  Aortic Valve: The aortic valve is tricuspid. Aortic valve regurgitation is not visualized. Aortic valve sclerosis is present, with no evidence of aortic valve stenosis.  Pulmonic Valve: The pulmonic valve was normal in structure. Pulmonic valve regurgitation is trivial.  Aorta: The aortic root is normal in size and structure. There is minimal (Grade I) plaque.  IAS/Shunts: Evidence of atrial level shunting detected by color flow Doppler. Agitated saline contrast bubble study was positive with shunting observed within 3-6 cardiac cycles suggestive of interatrial shunt.  Additional Comments: A device lead is visualized.  Dorris Carnes MD Electronically signed by Dorris Carnes MD Signature Date/Time: 11/27/2021/6:08:07 PM    Final (Updated)            EKG:  EKG ordered today and personally reviewed.  The ekg ordered today demonstrates sinus rhythm anterior MI bundle branch block left posterior fascicular block same 07/05/2022  Recent Labs: 09/20/2022: ALT 18; BUN 24; Creatinine, Ser 1.09; Hemoglobin 12.3; Platelets 293; Potassium 4.8; Sodium 141; TSH 1.970  Recent Lipid Panel    Component Value Date/Time   CHOL 188 09/20/2022 1157   TRIG 205 (H) 09/20/2022 1157   HDL 61 09/20/2022 1157   CHOLHDL 3.1 09/20/2022 1157   CHOLHDL 2.7 07/20/2021 1232   VLDL 37 07/20/2021  1232   LDLCALC 93 09/20/2022 1157   LDLDIRECT 91.4 05/22/2020 2122    Physical Exam:    VS:  BP 100/70 (BP Location: Right Arm, Patient Position: Sitting, Cuff Size: Normal)   Pulse 69   Ht '5\' 7"'$  (1.702 m)   Wt 229 lb (103.9 kg)   LMP  (LMP Unknown)   SpO2 92%   BMI 35.87 kg/m     Wt Readings from Last 3 Encounters:  02/27/23 229 lb (103.9 kg)  09/20/22 228 lb (103.4 kg)  07/05/22 226 lb (102.5 kg)     GEN:  Well nourished, well developed in no acute distress HEENT: Normal NECK: No JVD; No carotid bruits LYMPHATICS: No lymphadenopathy CARDIAC: RRR, no murmurs, rubs, gallops RESPIRATORY:  Clear to auscultation without rales, wheezing or rhonchi  ABDOMEN: Soft, non-tender, non-distended MUSCULOSKELETAL:  No edema; No deformity  SKIN: Warm and dry NEUROLOGIC:  Alert and oriented x 3 PSYCHIATRIC:  Normal affect    Signed, Shirlee More, MD  02/27/2023 2:11 PM    [facility]

## 2023-02-27 ENCOUNTER — Encounter: Payer: Self-pay | Admitting: Cardiology

## 2023-02-27 ENCOUNTER — Ambulatory Visit: Payer: BC Managed Care – PPO | Attending: Cardiology | Admitting: Cardiology

## 2023-02-27 VITALS — BP 100/70 | HR 69 | Ht 67.0 in | Wt 229.0 lb

## 2023-02-27 DIAGNOSIS — I25119 Atherosclerotic heart disease of native coronary artery with unspecified angina pectoris: Secondary | ICD-10-CM

## 2023-02-27 DIAGNOSIS — I472 Ventricular tachycardia, unspecified: Secondary | ICD-10-CM | POA: Diagnosis not present

## 2023-02-27 DIAGNOSIS — I110 Hypertensive heart disease with heart failure: Secondary | ICD-10-CM | POA: Diagnosis not present

## 2023-02-27 DIAGNOSIS — E782 Mixed hyperlipidemia: Secondary | ICD-10-CM

## 2023-02-27 DIAGNOSIS — Z79899 Other long term (current) drug therapy: Secondary | ICD-10-CM

## 2023-02-27 DIAGNOSIS — Z951 Presence of aortocoronary bypass graft: Secondary | ICD-10-CM | POA: Diagnosis not present

## 2023-02-27 DIAGNOSIS — I739 Peripheral vascular disease, unspecified: Secondary | ICD-10-CM

## 2023-02-27 DIAGNOSIS — Z95810 Presence of automatic (implantable) cardiac defibrillator: Secondary | ICD-10-CM

## 2023-02-27 DIAGNOSIS — I5032 Chronic diastolic (congestive) heart failure: Secondary | ICD-10-CM

## 2023-02-27 NOTE — Patient Instructions (Signed)
Medication Instructions:  Your physician recommends that you continue on your current medications as directed. Please refer to the Current Medication list given to you today.  *If you need a refill on your cardiac medications before your next appointment, please call your pharmacy*   Lab Work: Your physician recommends that you return for lab work in:   Labs today: CMP, Lipids, Pro BNP  If you have labs (blood work) drawn today and your tests are completely normal, you will receive your results only by: MyChart Message (if you have MyChart) OR A paper copy in the mail If you have any lab test that is abnormal or we need to change your treatment, we will call you to review the results.   Testing/Procedures: Your physician has requested that you have an echocardiogram. Echocardiography is a painless test that uses sound waves to create images of your heart. It provides your doctor with information about the size and shape of your heart and how well your heart's chambers and valves are working. This procedure takes approximately one hour. There are no restrictions for this procedure. Please do NOT wear cologne, perfume, aftershave, or lotions (deodorant is allowed). Please arrive 15 minutes prior to your appointment time.    Follow-Up: At [facility], you and your health needs are our priority.  As part of our continuing mission to provide you with exceptional heart care, we have created designated Provider Care Teams.  These Care Teams include your primary Cardiologist (physician) and Advanced Practice Providers (APPs -  Physician Assistants and Nurse Practitioners) who all work together to provide you with the care you need, when you need it.  We recommend signing up for the patient portal called "MyChart".  Sign up information is provided on this After Visit Summary.  MyChart is used to connect with patients for Virtual Visits (Telemedicine).  Patients are able to view lab/test  results, encounter notes, upcoming appointments, etc.  Non-urgent messages can be sent to your provider as well.   To learn more about what you can do with MyChart, go to [email or web address].    Your next appointment:   6 month(s)  Provider:   Shirlee More, MD    Other Instructions None

## 2023-02-28 ENCOUNTER — Telehealth: Payer: Self-pay

## 2023-02-28 ENCOUNTER — Other Ambulatory Visit: Payer: Self-pay

## 2023-02-28 ENCOUNTER — Telehealth: Payer: Self-pay | Admitting: Cardiology

## 2023-02-28 DIAGNOSIS — E875 Hyperkalemia: Secondary | ICD-10-CM

## 2023-02-28 LAB — COMPREHENSIVE METABOLIC PANEL
ALT: 19 IU/L (ref 0–32)
AST: 17 IU/L (ref 0–40)
Albumin/Globulin Ratio: 1.4 (ref 1.2–2.2)
Albumin: 4.4 g/dL (ref 3.9–4.9)
Alkaline Phosphatase: 65 IU/L (ref 44–121)
BUN/Creatinine Ratio: 20 (ref 12–28)
BUN: 26 mg/dL (ref 8–27)
Bilirubin Total: 0.3 mg/dL (ref 0.0–1.2)
CO2: 22 mmol/L (ref 20–29)
Calcium: 9.9 mg/dL (ref 8.7–10.3)
Chloride: 102 mmol/L (ref 96–106)
Creatinine, Ser: 1.32 mg/dL — ABNORMAL HIGH (ref 0.57–1.00)
Globulin, Total: 3.1 g/dL (ref 1.5–4.5)
Glucose: 273 mg/dL — ABNORMAL HIGH (ref 70–99)
Potassium: 6 mmol/L (ref 3.5–5.2)
Sodium: 140 mmol/L (ref 134–144)
Total Protein: 7.5 g/dL (ref 6.0–8.5)
eGFR: 46 mL/min/{1.73_m2} — ABNORMAL LOW (ref >59)

## 2023-02-28 LAB — LIPID PANEL
Chol/HDL Ratio: 3.9 ratio (ref 0.0–4.4)
Cholesterol, Total: 235 mg/dL — ABNORMAL HIGH (ref 100–199)
HDL: 60 mg/dL (ref >39)
LDL Chol Calc (NIH): 89 mg/dL (ref 0–99)
Triglycerides: 528 mg/dL — ABNORMAL HIGH (ref 0–149)
VLDL Cholesterol Cal: 86 mg/dL — ABNORMAL HIGH (ref 5–40)

## 2023-02-28 LAB — PRO B NATRIURETIC PEPTIDE: NT-Pro BNP: 274 pg/mL (ref 0–287)

## 2023-02-28 MED ORDER — LOKELMA 10 G PO PACK: 10.0000 g | PACK | Freq: Every day | ORAL | 0 refills | Status: DC

## 2023-02-28 NOTE — Telephone Encounter (Signed)
Crystal from [employer] is calling to report alert lab results

## 2023-02-28 NOTE — Telephone Encounter (Signed)
[facility] called to report a potassium of 6.0

## 2023-02-28 NOTE — Telephone Encounter (Signed)
Received the following message below from Dr. [name]:  "She needs to start on Lokelma 10 g daily I will give her enough for 2 weeks and have her come back Monday to our office for a status BMP. If taking over-the-counter potassium supplements like sodium substitutes she should stop."  Called the patient and informed her of Dr. Joya Gaskins recommendation. Patient was appreciative for the call and had no further questions at this time.

## 2023-03-06 DIAGNOSIS — L97522 Non-pressure chronic ulcer of other part of left foot with fat layer exposed: Secondary | ICD-10-CM | POA: Diagnosis not present

## 2023-03-06 DIAGNOSIS — L89893 Pressure ulcer of other site, stage 3: Secondary | ICD-10-CM | POA: Diagnosis not present

## 2023-03-07 ENCOUNTER — Other Ambulatory Visit: Payer: Self-pay | Admitting: Cardiology

## 2023-03-07 ENCOUNTER — Other Ambulatory Visit: Payer: Self-pay | Admitting: Physician Assistant

## 2023-03-12 ENCOUNTER — Ambulatory Visit: Payer: BC Managed Care – PPO | Attending: Cardiology

## 2023-03-12 DIAGNOSIS — I739 Peripheral vascular disease, unspecified: Secondary | ICD-10-CM

## 2023-03-12 DIAGNOSIS — E782 Mixed hyperlipidemia: Secondary | ICD-10-CM

## 2023-03-12 DIAGNOSIS — Z951 Presence of aortocoronary bypass graft: Secondary | ICD-10-CM

## 2023-03-12 DIAGNOSIS — I472 Ventricular tachycardia, unspecified: Secondary | ICD-10-CM | POA: Diagnosis not present

## 2023-03-12 DIAGNOSIS — Z79899 Other long term (current) drug therapy: Secondary | ICD-10-CM

## 2023-03-12 DIAGNOSIS — I25119 Atherosclerotic heart disease of native coronary artery with unspecified angina pectoris: Secondary | ICD-10-CM

## 2023-03-12 DIAGNOSIS — Z95810 Presence of automatic (implantable) cardiac defibrillator: Secondary | ICD-10-CM

## 2023-03-12 DIAGNOSIS — I110 Hypertensive heart disease with heart failure: Secondary | ICD-10-CM

## 2023-03-12 DIAGNOSIS — I5032 Chronic diastolic (congestive) heart failure: Secondary | ICD-10-CM

## 2023-03-12 LAB — ECHOCARDIOGRAM COMPLETE: S' Lateral: 2.8 cm

## 2023-03-12 MED ORDER — PERFLUTREN LIPID MICROSPHERE
1.0000 mL | INTRAVENOUS | Status: AC | PRN
  Administered 2023-03-12: 7 mL via INTRAVENOUS

## 2023-03-13 DIAGNOSIS — L89893 Pressure ulcer of other site, stage 3: Secondary | ICD-10-CM | POA: Diagnosis not present

## 2023-03-13 DIAGNOSIS — L97522 Non-pressure chronic ulcer of other part of left foot with fat layer exposed: Secondary | ICD-10-CM | POA: Diagnosis not present

## 2023-03-19 ENCOUNTER — Encounter: Payer: Self-pay | Admitting: Physician Assistant

## 2023-03-19 ENCOUNTER — Ambulatory Visit: Payer: BC Managed Care – PPO | Admitting: Physician Assistant

## 2023-03-19 ENCOUNTER — Ambulatory Visit (INDEPENDENT_AMBULATORY_CARE_PROVIDER_SITE_OTHER): Payer: BC Managed Care – PPO

## 2023-03-19 VITALS — BP 112/80 | HR 69 | Temp 97.2°F | Ht 67.0 in | Wt 230.0 lb

## 2023-03-19 DIAGNOSIS — Z95810 Presence of automatic (implantable) cardiac defibrillator: Secondary | ICD-10-CM

## 2023-03-19 DIAGNOSIS — Z794 Long term (current) use of insulin: Secondary | ICD-10-CM

## 2023-03-19 DIAGNOSIS — I739 Peripheral vascular disease, unspecified: Secondary | ICD-10-CM

## 2023-03-19 DIAGNOSIS — E782 Mixed hyperlipidemia: Secondary | ICD-10-CM

## 2023-03-19 DIAGNOSIS — Z89432 Acquired absence of left foot: Secondary | ICD-10-CM

## 2023-03-19 DIAGNOSIS — F419 Anxiety disorder, unspecified: Secondary | ICD-10-CM

## 2023-03-19 DIAGNOSIS — E0865 Diabetes mellitus due to underlying condition with hyperglycemia: Secondary | ICD-10-CM | POA: Diagnosis not present

## 2023-03-19 DIAGNOSIS — I10 Essential (primary) hypertension: Secondary | ICD-10-CM

## 2023-03-19 DIAGNOSIS — E559 Vitamin D deficiency, unspecified: Secondary | ICD-10-CM | POA: Diagnosis not present

## 2023-03-19 DIAGNOSIS — K219 Gastro-esophageal reflux disease without esophagitis: Secondary | ICD-10-CM

## 2023-03-19 DIAGNOSIS — S98111A Complete traumatic amputation of right great toe, initial encounter: Secondary | ICD-10-CM

## 2023-03-19 DIAGNOSIS — I5032 Chronic diastolic (congestive) heart failure: Secondary | ICD-10-CM

## 2023-03-19 DIAGNOSIS — I472 Ventricular tachycardia, unspecified: Secondary | ICD-10-CM

## 2023-03-19 DIAGNOSIS — L304 Erythema intertrigo: Secondary | ICD-10-CM

## 2023-03-19 MED ORDER — OMEPRAZOLE 20 MG PO CPDR
20.0000 mg | DELAYED_RELEASE_CAPSULE | Freq: Every day | ORAL | 0 refills | Status: DC
Start: 2023-03-19 — End: 2023-07-15

## 2023-03-19 MED ORDER — NYSTATIN 100000 UNIT/GM EX POWD
1.0000 | Freq: Every day | CUTANEOUS | 2 refills | Status: DC | PRN
Start: 2023-03-19 — End: 2023-10-16

## 2023-03-19 MED ORDER — TOUJEO MAX SOLOSTAR 300 UNIT/ML ~~LOC~~ SOPN
50.0000 [IU] | PEN_INJECTOR | Freq: Every day | SUBCUTANEOUS | 1 refills | Status: DC
Start: 2023-03-19 — End: 2023-04-18

## 2023-03-19 NOTE — Progress Notes (Signed)
Subjective:  [name], female    DOB: 01/14/1961  Age: 62 y.o. MRN: [id number]  Chief Complaint  Patient presents with   Hypertension   Diabetes    Diabetes  Hypertension    Travia Savoy has a history of type 2 Diabetes Mellitus for more than 20 years. Current treatment includes toujeo 50 units qd (has just switched to this one)  humalog sliding scale, farxiga and glucophage   She denies hypoglycemic episodes. She states blood glucose levels at home range from 130 to 150 fasting. She  is consuming a heart healthy diet a She performs diabetic foot checks daily after showering. She is following with [facility] every 2 weeks for foot care and currently has pressure ulcer on bottom of left foot She is also seeing wound therapy Pt states that another provider before coming to our office had instructed her on how to use a sliding scale however she has not been following as directed.  She is actually giving herself up to 25 units and taking glucose AFTER she eats then adjusts medication. I spent great time explaining to patient that she needs to be taking a fasting glucose first thing in am, check BEFORE lunch and BEFORE dinner.  Recommend that if glucose 131-180 then give 4 units - if 181-240 give 8 units and if 241-300 then 10 units She is to notify our office if she is getting any higher readings - Bring a log of glucose readings to next visit - the overall goal is to get her to adjust the Toujeo to where sliding scale is not needed At this time continue glucophage and farxiga  Pt with history of hyperlipidemia - currently also follows with cardiology.  Treatment includes , tricor, fish oil and crestor 20mg  qd-she had lipid panel done with cardiology 2 weeks ago but has not heard back from them regarding results - In EPIC it shows total chol at 235, trig at 528, and LDL at 89. She states she has not had Repatha in about 6 months and is waiting to hear from cardiology about  staying on medication --- I did advise to continue this med   Pt with history of vit D disorder - taking weekly supplement and due for labwork  Pt with history of GERD - stable on omeprazole 20mg  qd - requests refill  Pt requests refill of nystatin powder which she uses for chronic intertrigo symptoms  Pt follows with cardiology on a regular basis.  She sees Dr [name] for history of CAD, chronic diastolic heart failure,and history of vtach.  She is currently on amiodarone therapy and has implantable defibrillator (which is monitored regularly).  She is also on  metoprolol, lasix, plavix,  Pt with history of hypertension as well and also taking diovan 80mg  qd She was recently seen in his office and follows every 6 months  Pt with history of anxiety - stable on lorazepam 1mg  as needed   Lab Results  Component Value Date   HGBA1C 7.3 (H) 09/20/2022   HGBA1C 8.9 (H) 05/22/2022   HGBA1C 10.7 (H) 11/23/2021    Current Outpatient Medications on File Prior to Visit  Medication Sig Dispense Refill   acetaminophen (TYLENOL) 325 MG tablet Take 2 tablets (650 mg total) by mouth every 4 (four) hours as needed for headache or mild pain.     amiodarone (PACERONE) 200 MG tablet Take 1 tablet (200 mg total) by mouth daily. 90 tablet 3   aspirin 81 MG  chewable tablet Chew 1 tablet by mouth daily.     clopidogrel (PLAVIX) 75 MG tablet Take 1 tablet by mouth once daily 30 tablet 0   dapagliflozin propanediol (FARXIGA) 10 MG TABS tablet Take 1 tablet by mouth once daily 30 tablet 0   Evolocumab (REPATHA SURECLICK) XX123456 MG/ML SOAJ Inject into the skin.     fenofibrate (TRICOR) 145 MG tablet Take 1 tablet (145 mg total) by mouth daily. 90 tablet 3   furosemide (LASIX) 20 MG tablet Take 1 tablet (20 mg total) by mouth daily. 90 tablet 3   HUMALOG KWIKPEN 100 UNIT/ML KwikPen Inject 10-50 Units into the skin at bedtime. Sliding scale 15 mL 12   insulin glargine, 2 Unit Dial, (TOUJEO MAX SOLOSTAR) 300 UNIT/ML  Solostar Pen Inject 50 Units into the skin daily. 15 mL 1   LORazepam (ATIVAN) 1 MG tablet Take 1 tablet (1 mg total) by mouth as needed for anxiety. 30 tablet 0   metFORMIN (GLUCOPHAGE) 500 MG tablet TAKE 1 TABLET BY MOUTH TWICE DAILY WITH A MEAL 180 tablet 0   metoprolol succinate (TOPROL-XL) 25 MG 24 hr tablet Take 1 tablet (25 mg total) by mouth in the morning, at noon, and at bedtime. 270 tablet 3   nitroGLYCERIN (NITROSTAT) 0.4 MG SL tablet Place 1 tablet (0.4 mg total) under the tongue every 5 (five) minutes as needed for chest pain. 25 tablet 6   nystatin (MYCOSTATIN/NYSTOP) powder Apply 1 Application topically daily as needed (rash/yeast). 15 g 2   Omega-3 Fatty Acids (FISH OIL) 1000 MG CAPS Take by mouth.     omeprazole (PRILOSEC) 20 MG capsule Take 1 capsule by mouth once daily 90 capsule 0   promethazine (PHENERGAN) 25 MG tablet Take 25 mg by mouth every 6 (six) hours as needed for nausea or vomiting.     rosuvastatin (CRESTOR) 20 MG tablet Take 1 tablet by mouth once daily 30 tablet 0   senna-docusate (SENOKOT-S) 8.6-50 MG tablet Take 1 tablet by mouth daily.     valsartan (DIOVAN) 80 MG tablet Take 1 tablet by mouth twice daily 60 tablet 0   vitamin C (ASCORBIC ACID) 500 MG tablet Take 500 mg by mouth daily.     Vitamin D, Ergocalciferol, (DRISDOL) 1.25 MG (50000 UNIT) CAPS capsule TAKE 1 CAPSULE BY MOUTH ONCE A WEEK ON  SATURDAY 12 capsule 0   No current facility-administered medications on file prior to visit.   Past Medical History:  Diagnosis Date   Abnormal EKG 10/23/2021   Abnormal mammogram of left breast 09/04/2021   Abnormal stress test 10/23/2021   Acute chest pain 05/08/2016   AICD (automatic cardioverter/defibrillator) present    St Jude/Abbott device   AKI (acute kidney injury) 10/23/2021   Angina pectoris 09/11/2017   Anxiety 10/23/2021   Bacteremia 11/22/2021   Breast wound, right, subsequent encounter 09/04/2021   Burping 05/08/2016   Cancer    cervical -  hysterectomy   Cellulitis of right breast 10/23/2021   Cellulitis, wound, post-operative 10/09/2021   Chronic diastolic heart failure 99991111   Coronary artery disease involving native coronary artery of native heart with angina pectoris 09/12/2015   Overview:  PCI and stent of RCA 2009, last cath 2012 with medical therapy  CABG May 2017   Demand ischemia    Diabetic foot infection 11/21/2021   Diabetic foot ulcer 02/16/2020   Emphysema lung 09/11/2017   patient not aware of this dx   Essential hypertension 09/12/2015   Gangrene 10/23/2021  GERD (gastroesophageal reflux disease) 05/08/2016   Hematoma 10/23/2021   Hiatal hernia 10/23/2021   Hyperglycemia 10/23/2021   Hyperglycemia due to type 2 diabetes mellitus 10/23/2021   Hyperlipidemia 09/12/2015   Hyponatremia 10/23/2021   ICD (implantable cardioverter-defibrillator) in place 05/16/2021   Infection of great toe 10/23/2021   Lactic acidosis 10/23/2021   Leukocytosis 10/23/2021   Mediastinitis 06/28/2016   Morbid obesity 05/08/2016   Myocardial infarction    NSTEMI (non-ST elevated myocardial infarction) 05/22/2020   Obesity (BMI 30-39.9) 10/23/2021   Perineal abscess 10/23/2021   Peripheral vascular disease    S/P CABG (coronary artery bypass graft) 06/03/2016   Overview:  The patient underwent sternal reconstruction on 06/21/16 with pec flaps for mediastinitis from a prior CABG in May 2017. On admission, she was critically ill from sepsis and had altered mental status. She was last seen in clinic on 08/09/16 at which time she was doing well.   Severe sepsis 06/03/2016   Sinus tachycardia 05/08/2016   Tobacco use disorder 04/20/2016   Overview:  Quit in May 2017.   Type 2 diabetes mellitus with foot ulcer 05/08/2016   Unstable angina 05/22/2020   Ventricular tachycardia 12/17/2020   Wound, surgical, infected 06/06/2016   Overview:  sternal   Past Surgical History:  Procedure Laterality Date   ABDOMINAL AORTOGRAM W/LOWER EXTREMITY N/A  10/11/2021   Procedure: ABDOMINAL AORTOGRAM W/LOWER EXTREMITY;  Surgeon: Marty Heck, MD;  Location: [place] [facility];  Service: Cardiovascular;  Laterality: N/A;   AMPUTATION TOE Right 02/20/2020   Procedure: AMPUTATION RIGHT GREAT  TOE;  Surgeon: Felipa Furnace, DPM;  Location: [place];  Service: Podiatry;  Laterality: Right;   BLADDER SURGERY     BONE BIOPSY Left 02/01/2022   Procedure: BONE BIOPSY LEFT FOOT, IRRIGATION AND DEBRIDEMENT;  Surgeon: Landis Martins, DPM;  Location: [place];  Service: Podiatry;  Laterality: Left;   BUBBLE STUDY  11/27/2021   Procedure: BUBBLE STUDY;  Surgeon: Fay Records, MD;  Location: [place];  Service: Cardiovascular;;   CARDIAC CATHETERIZATION     CORONARY ARTERY BYPASS GRAFT     CORONARY STENT INTERVENTION N/A 05/25/2020   Procedure: CORONARY STENT INTERVENTION;  Surgeon: Wellington Hampshire, MD;  Location: [place] [facility];  Service: Cardiovascular;  Laterality: N/A;   CORONARY STENT INTERVENTION N/A 06/22/2020   Procedure: CORONARY STENT INTERVENTION;  Surgeon: Leonie Man, MD;  Location: [place] [facility];  Service: Cardiovascular;  Laterality: N/A;   CORONARY ULTRASOUND/IVUS N/A 06/22/2020   Procedure: Intravascular Ultrasound/IVUS;  Surgeon: Leonie Man, MD;  Location: [place] [facility];  Service: Cardiovascular;  Laterality: N/A;   ICD IMPLANT N/A 12/19/2020   Procedure: ICD IMPLANT;  Surgeon: Evans Lance, MD;  Location: [place] [facility];  Service: Cardiovascular;  Laterality: N/A;   INCISION AND DRAINAGE Right 02/19/2020   Procedure: INCISION AND DRAINAGE;  Surgeon: Trula Slade, DPM;  Location: [place];  Service: Podiatry;  Laterality: Right;  Block done by surgeon   IRRIGATION AND DEBRIDEMENT FOOT Left 10/10/2021   Procedure: IRRIGATION AND DEBRIDEMENT FOOT;  Surgeon: Landis Martins, DPM;  Location: [place];  Service: Podiatry;  Laterality: Left;   IRRIGATION AND DEBRIDEMENT FOOT Left 11/23/2021   Procedure: IRRIGATION AND  DEBRIDEMENT FOOT;  Surgeon: Lorenda Peck, MD;  Location: [place];  Service: Podiatry;  Laterality: Left;  will do local block   LEFT HEART CATH AND CORS/GRAFTS ANGIOGRAPHY N/A 05/23/2020   Procedure: LEFT HEART CATH AND CORS/GRAFTS  ANGIOGRAPHY;  Surgeon: Nelva Bush, MD;  Location: [place] [facility];  Service: Cardiovascular;  Laterality: N/A;   LEFT HEART CATH AND CORS/GRAFTS ANGIOGRAPHY N/A 12/19/2020   Procedure: LEFT HEART CATH AND CORS/GRAFTS ANGIOGRAPHY;  Surgeon: Burnell Blanks, MD;  Location: [place] [facility];  Service: Cardiovascular;  Laterality: N/A;   LEFT HEART CATH AND CORS/GRAFTS ANGIOGRAPHY N/A 07/21/2021   Procedure: LEFT HEART CATH AND CORS/GRAFTS ANGIOGRAPHY;  Surgeon: Nelva Bush, MD;  Location: [place] [facility];  Service: Cardiovascular;  Laterality: N/A;   METATARSAL HEAD EXCISION Right 05/02/2020   Procedure: FIRST METATARSAL HEAD RESECTION; RIGHT FOOT WOUND CLOSURE;  Surgeon: Landis Martins, DPM;  Location: [place];  Service: Podiatry;  Laterality: Right;  MAC W/LOCAL   PERIPHERAL VASCULAR BALLOON ANGIOPLASTY Left 10/11/2021   Procedure: PERIPHERAL VASCULAR BALLOON ANGIOPLASTY;  Surgeon: Marty Heck, MD;  Location: [place] [facility];  Service: Cardiovascular;  Laterality: Left;  Anterior Tibial Artery   TEE WITHOUT CARDIOVERSION N/A 11/27/2021   Procedure: TRANSESOPHAGEAL ECHOCARDIOGRAM (TEE);  Surgeon: Fay Records, MD;  Location: [facility];  Service: Cardiovascular;  Laterality: N/A;   TRANSMETATARSAL AMPUTATION Left 11/23/2021   Procedure: TRANSMETATARSAL AMPUTATION;  Surgeon: Lorenda Peck, MD;  Location: [place];  Service: Podiatry;  Laterality: Left;   TUBAL LIGATION      Family History  Problem Relation Age of Onset   Hypertension Mother    Hyperlipidemia Mother    Heart attack Father    Heart disease Father    Hypertension Father    Alzheimer's disease Father    Hypertension Brother    Hyperlipidemia Brother     Social History   Socioeconomic History   Marital status: Married    Spouse name: Not on file   Number of children: Not on file   Years of education: Not on file   Highest education level: Not on file  Occupational History   Not on file  Tobacco Use   Smoking status: Former    Types: Cigarettes    Quit date: 05/2016    Years since quitting: 6.8   Smokeless tobacco: Never  Vaping Use   Vaping Use: Never used  Substance and Sexual Activity   Alcohol use: Yes    Comment: social wine   Drug use: No   Sexual activity: Yes    Birth control/protection: Surgical    Comment: Hysterectomy  Other Topics Concern   Not on file  Social History Narrative   Not on file   Social Determinants of Health   Financial Resource Strain: Not on file  Food Insecurity: Not on file  Transportation Needs: Not on file  Physical Activity: Not on file  Stress: Not on file  Social Connections: Not on file   CONSTITUTIONAL: Negative for chills, fatigue, fever, unintentional weight gain and unintentional weight loss.  E/N/T: Negative for ear pain, nasal congestion and sore throat.  CARDIOVASCULAR: Negative for chest pain, dizziness, palpitations and pedal edema.  RESPIRATORY: Negative for recent cough and dyspnea.  GASTROINTESTINAL: Negative for abdominal pain, acid reflux symptoms, constipation, diarrhea, nausea and vomiting.  MSK: Negative for arthralgias and myalgias.  INTEGUMENTARY: Negative for rash.  NEUROLOGICAL: Negative for dizziness and headaches.  PSYCHIATRIC: Negative for sleep disturbance and to question depression screen.  Negative for depression, negative for anhedonia.           Objective:  PHYSICAL EXAM:   VS: BP 112/80 (BP Location: Left Arm, Patient Position: Sitting, Cuff Size: Large)  Pulse 69   Temp (!) 97.2 F (36.2 C) (Temporal)   Ht 5\' 7"  (1.702 m)   Wt 230 lb (104.3 kg)   LMP  (LMP Unknown)   SpO2 99%   BMI 36.02 kg/m   GEN: Well nourished, well  developed, in no acute distress  Cardiac: RRR; no murmurs, rubs, or gallops,no edema - Respiratory:  normal respiratory rate and pattern with no distress - normal breath sounds with no rales, rhonchi, wheezes or rubs MS: no deformity or atrophy except she has left foot wrapped in bandage and has boot on Skin: warm and dry, no rash  Neuro:  Alert and Oriented x 3, - CN II-Xii grossly intact Psych: euthymic mood, appropriate affect and demeanor   Lab Results  Component Value Date   WBC 7.6 09/20/2022   HGB 12.3 09/20/2022   HCT 36.4 09/20/2022   PLT 293 09/20/2022   GLUCOSE 273 (H) 02/27/2023   CHOL 235 (H) 02/27/2023   TRIG 528 (H) 02/27/2023   HDL 60 02/27/2023   LDLDIRECT 91.4 05/22/2020   LDLCALC 89 02/27/2023   ALT 19 02/27/2023   AST 17 02/27/2023   NA 140 02/27/2023   K 6.0 (HH) 02/27/2023   CL 102 02/27/2023   CREATININE 1.32 (H) 02/27/2023   BUN 26 02/27/2023   CO2 22 02/27/2023   TSH 1.970 09/20/2022   INR 1.1 07/20/2021   HGBA1C 7.3 (H) 09/20/2022   MICROALBUR 293.5 (H) 07/21/2021      Assessment & Plan:   Problem List Items Addressed This Visit       Cardiovascular and Mediastinum   Chronic diastolic heart failure (HCC)   Relevant Medications   nitroGLYCERIN (NITROSTAT) 0.4 MG SL tablet Continue meds Follow up with cardiology   Essential hypertension - Primary   Relevant Medications   nitroGLYCERIN (NITROSTAT) 0.4 MG SL tablet Continue all meds Follow with cardiology   Other Relevant Orders   CBC with Differential/Platelet   Comprehensive metabolic panel   TSH   Peripheral vascular disease (HCC)   Relevant Medications   nitroGLYCERIN (NITROSTAT) 0.4 MG SL tablet     Digestive   GERD (gastroesophageal reflux disease)   Relevant Medications   omeprazole (PRILOSEC) 20 MG capsule     Endocrine   Diabetic foot ulcer (HCC)   Relevant Medications   insulin degludec (TRESIBA FLEXTOUCH) 100 UNIT/ML FlexTouch Pen Continue follow up with podiatry    Diabetes mellitus due to underlying condition with hyperglycemia, with long-term current use of insulin (HCC)   Relevant Medications   insulin degludec (TRESIBA FLEXTOUCH) 100 UNIT/ML FlexTouch Pen   Other Relevant Orders   CBC with Differential/Platelet   Comprehensive metabolic panel   TSH   Hemoglobin A1c   DISCUSSED IN DETAIL ABOUT DOSING SLIDING SCALE HUMALOG - ADJUST DOSE AS DESCRIBED IN HPI - KEEP RECORD OF GLUCOSE READINGS AND FOLLOW UP IN ONE MONTH     Musculoskeletal and Integument   Intertrigo   Relevant Medications   nystatin (MYCOSTATIN/NYSTOP) powder     Other   Hyperlipidemia   Relevant Medications   nitroGLYCERIN (NITROSTAT) 0.4 MG SL tablet   Other Relevant Orders   Lipid panel Continue meds Pt will discuss with cardiology - recommend to continue Repatha   ICD (implantable cardioverter-defibrillator) in place   Relevant Medications   nitroGLYCERIN (NITROSTAT) 0.4 MG SL tablet Follow with cardiology   Anxiety Continue lorazepam   Partial nontraumatic amputation of left foot (HCC)   Vitamin D deficiency   Relevant  Orders   VITAMIN D 25 Hydroxy (Vit-D Deficiency, Fractures)   Amputation of right great toe ([place])                                      .  No orders of the defined types were placed in this encounter.   Orders Placed This Encounter  Procedures   CBC with Differential/Platelet   Comprehensive metabolic panel   Hemoglobin A1c   VITAMIN D 25 Hydroxy (Vit-D Deficiency, Fractures)     Follow-up: Return in about 4 weeks (around 04/16/2023) for follow up.  An After Visit Summary was printed and given to the patient.  Yetta Flock [facility] [phone]

## 2023-03-20 LAB — CUP PACEART REMOTE DEVICE CHECK
Battery Remaining Longevity: 88 mo
Battery Remaining Percentage: 78 %
Battery Voltage: 3.01 V
Brady Statistic AP VP Percent: 1 %
Brady Statistic AP VS Percent: 1.2 %
Brady Statistic AS VP Percent: 1 %
Brady Statistic AS VS Percent: 99 %
Brady Statistic RA Percent Paced: 1.2 %
Brady Statistic RV Percent Paced: 1 %
Date Time Interrogation Session: 20240402030005
HighPow Impedance: 62 Ohm
Implantable Lead Connection Status: 753985
Implantable Lead Connection Status: 753985
Implantable Lead Implant Date: 20220103
Implantable Lead Implant Date: 20220103
Implantable Lead Location: 753859
Implantable Lead Location: 753860
Implantable Pulse Generator Implant Date: 20220103
Lead Channel Impedance Value: 340 Ohm
Lead Channel Impedance Value: 390 Ohm
Lead Channel Pacing Threshold Amplitude: 0.75 V
Lead Channel Pacing Threshold Amplitude: 1 V
Lead Channel Pacing Threshold Pulse Width: 0.5 ms
Lead Channel Pacing Threshold Pulse Width: 0.5 ms
Lead Channel Sensing Intrinsic Amplitude: 12 mV
Lead Channel Sensing Intrinsic Amplitude: 5 mV
Lead Channel Setting Pacing Amplitude: 2 V
Lead Channel Setting Pacing Amplitude: 2.5 V
Lead Channel Setting Pacing Pulse Width: 0.5 ms
Lead Channel Setting Sensing Sensitivity: 0.5 mV
Pulse Gen Serial Number: 111037319

## 2023-03-20 LAB — COMPREHENSIVE METABOLIC PANEL
ALT: 11 IU/L (ref 0–32)
AST: 12 IU/L (ref 0–40)
Albumin/Globulin Ratio: 1.2 (ref 1.2–2.2)
Albumin: 4 g/dL (ref 3.9–4.9)
Alkaline Phosphatase: 58 IU/L (ref 44–121)
BUN/Creatinine Ratio: 20 (ref 12–28)
BUN: 20 mg/dL (ref 8–27)
Bilirubin Total: 0.2 mg/dL (ref 0.0–1.2)
CO2: 22 mmol/L (ref 20–29)
Calcium: 9.7 mg/dL (ref 8.7–10.3)
Chloride: 105 mmol/L (ref 96–106)
Creatinine, Ser: 1.01 mg/dL — ABNORMAL HIGH (ref 0.57–1.00)
Globulin, Total: 3.3 g/dL (ref 1.5–4.5)
Glucose: 129 mg/dL — ABNORMAL HIGH (ref 70–99)
Potassium: 5.2 mmol/L (ref 3.5–5.2)
Sodium: 141 mmol/L (ref 134–144)
Total Protein: 7.3 g/dL (ref 6.0–8.5)
eGFR: 63 mL/min/{1.73_m2} (ref >59)

## 2023-03-20 LAB — CBC WITH DIFFERENTIAL/PLATELET
Basophils Absolute: 0.1 10*3/uL (ref 0.0–0.2)
Basos: 1 %
EOS (ABSOLUTE): 0.2 10*3/uL (ref 0.0–0.4)
Eos: 2 %
Hematocrit: 37.9 % (ref 34.0–46.6)
Hemoglobin: 12.3 g/dL (ref 11.1–15.9)
Immature Grans (Abs): 0 10*3/uL (ref 0.0–0.1)
Immature Granulocytes: 0 %
Lymphocytes Absolute: 2.2 10*3/uL (ref 0.7–3.1)
Lymphs: 23 %
MCH: 28.1 pg (ref 26.6–33.0)
MCHC: 32.5 g/dL (ref 31.5–35.7)
MCV: 87 fL (ref 79–97)
Monocytes Absolute: 0.6 10*3/uL (ref 0.1–0.9)
Monocytes: 6 %
Neutrophils Absolute: 6.5 10*3/uL (ref 1.4–7.0)
Neutrophils: 68 %
Platelets: 278 10*3/uL (ref 150–450)
RBC: 4.38 x10E6/uL (ref 3.77–5.28)
RDW: 13.6 % (ref 11.7–15.4)
WBC: 9.5 10*3/uL (ref 3.4–10.8)

## 2023-03-20 LAB — HEMOGLOBIN A1C
Est. average glucose Bld gHb Est-mCnc: 206 mg/dL
Hgb A1c MFr Bld: 8.8 % — ABNORMAL HIGH (ref 4.8–5.6)

## 2023-03-20 LAB — VITAMIN D 25 HYDROXY (VIT D DEFICIENCY, FRACTURES): Vit D, 25-Hydroxy: 27.7 ng/mL — ABNORMAL LOW (ref 30.0–100.0)

## 2023-03-21 DIAGNOSIS — L97522 Non-pressure chronic ulcer of other part of left foot with fat layer exposed: Secondary | ICD-10-CM | POA: Diagnosis not present

## 2023-03-21 DIAGNOSIS — L89893 Pressure ulcer of other site, stage 3: Secondary | ICD-10-CM | POA: Diagnosis not present

## 2023-03-22 ENCOUNTER — Other Ambulatory Visit: Payer: Self-pay | Admitting: Physician Assistant

## 2023-03-22 DIAGNOSIS — E0865 Diabetes mellitus due to underlying condition with hyperglycemia: Secondary | ICD-10-CM

## 2023-03-23 ENCOUNTER — Other Ambulatory Visit: Payer: Self-pay | Admitting: Cardiology

## 2023-03-27 DIAGNOSIS — L89893 Pressure ulcer of other site, stage 3: Secondary | ICD-10-CM | POA: Diagnosis not present

## 2023-04-03 DIAGNOSIS — L89893 Pressure ulcer of other site, stage 3: Secondary | ICD-10-CM | POA: Diagnosis not present

## 2023-04-10 ENCOUNTER — Ambulatory Visit (INDEPENDENT_AMBULATORY_CARE_PROVIDER_SITE_OTHER): Payer: BC Managed Care – PPO | Admitting: Podiatry

## 2023-04-10 DIAGNOSIS — Z91199 Patient's noncompliance with other medical treatment and regimen due to unspecified reason: Secondary | ICD-10-CM

## 2023-04-10 NOTE — Progress Notes (Signed)
No show

## 2023-04-15 ENCOUNTER — Ambulatory Visit: Payer: BC Managed Care – PPO | Admitting: Physician Assistant

## 2023-04-15 DIAGNOSIS — L89893 Pressure ulcer of other site, stage 3: Secondary | ICD-10-CM | POA: Diagnosis not present

## 2023-04-17 DIAGNOSIS — H1045 Other chronic allergic conjunctivitis: Secondary | ICD-10-CM | POA: Diagnosis not present

## 2023-04-18 ENCOUNTER — Encounter: Payer: Self-pay | Admitting: Physician Assistant

## 2023-04-18 ENCOUNTER — Ambulatory Visit: Payer: BC Managed Care – PPO | Admitting: Physician Assistant

## 2023-04-18 VITALS — BP 122/82 | HR 81 | Temp 97.4°F | Ht 67.0 in | Wt 227.0 lb

## 2023-04-18 DIAGNOSIS — I25119 Atherosclerotic heart disease of native coronary artery with unspecified angina pectoris: Secondary | ICD-10-CM

## 2023-04-18 DIAGNOSIS — I10 Essential (primary) hypertension: Secondary | ICD-10-CM | POA: Diagnosis not present

## 2023-04-18 DIAGNOSIS — E0865 Diabetes mellitus due to underlying condition with hyperglycemia: Secondary | ICD-10-CM

## 2023-04-18 DIAGNOSIS — Z794 Long term (current) use of insulin: Secondary | ICD-10-CM

## 2023-04-18 DIAGNOSIS — E1165 Type 2 diabetes mellitus with hyperglycemia: Secondary | ICD-10-CM

## 2023-04-18 MED ORDER — TOUJEO MAX SOLOSTAR 300 UNIT/ML ~~LOC~~ SOPN
PEN_INJECTOR | SUBCUTANEOUS | 1 refills | Status: DC
Start: 2023-04-18 — End: 2023-08-13

## 2023-04-18 NOTE — Progress Notes (Signed)
Subjective:  [name], female    DOB: 05/10/1961  Age: 62 y.o. MRN: [id number]  Chief Complaint  Patient presents with   diabetes    HPI  Pt in today for follow up of diabetes.  She is taking Toujeo 50 units daily in the morning.  She does eat breakfast, lunch and dinner.  She states her glucose fasting runs around 130.  Two hours after eating lunch it is about 175 and two hours after eating supper it ranges about 250.  She continues to give short acting insulin and actually giving when she checks glucose.  Explained to her again not to give the short acting insulin postprandial but to guide the doseage according to glucose before eating She was supposed to bring glucose diary to this visit but did not  Pt mentions that she had some left arm pain earlier in week.  She describes as a soreness with movement and had been lifting and caring for mother (who recently passed) and thinks was due to that.  She is not having pain now.  Does have strong cardiac history - last visit with cardiology about 6 weeks ago.  Denies chest pain/dyspnea    03/19/2023    9:53 AM 09/20/2022   11:17 AM 03/15/2022    3:05 PM  Depression screen PHQ 2/9  Decreased Interest 0 0 0  Down, Depressed, Hopeless 1 0 0  PHQ - 2 Score 1 0 0  Altered sleeping 1 3   Tired, decreased energy 1 0   Change in appetite 0 0   Feeling bad or failure about yourself  0 0   Trouble concentrating 0 0   Moving slowly or fidgety/restless 0 0   Suicidal thoughts 0 0   PHQ-9 Score 3 3   Difficult doing work/chores Not difficult at all Not difficult at all         11/28/2021    2:00 AM 11/28/2021    8:37 AM 02/01/2022   12:07 PM 03/15/2022    3:07 PM 03/19/2023    9:53 AM  Fall Risk  Falls in the past year?    0 1  Was there an injury with Fall?    0 1  Fall Risk Category Calculator    0 2  Fall Risk Category (Retired)    Low   (RETIRED) Patient Fall Risk Level Moderate fall risk Moderate fall risk Moderate fall  risk Low fall risk   Patient at Risk for Falls Due to     History of fall(s);No Fall Risks  Fall risk Follow up    Falls evaluation completed Falls evaluation completed     ROS  CARDIOVASCULAR: Negative for chest pain, dizziness, palpitations and pedal edema.  RESPIRATORY: Negative for recent cough and dyspnea.  GASTROINTESTINAL: Negative for abdominal pain, acid reflux symptoms, constipation, diarrhea, nausea and vomiting.  Musc - see HPI   Current Outpatient Medications:    acetaminophen (TYLENOL) 325 MG tablet, Take 2 tablets (650 mg total) by mouth every 4 (four) hours as needed for headache or mild pain., Disp: , Rfl:    amiodarone (PACERONE) 200 MG tablet, Take 1 tablet by mouth once daily, Disp: 90 tablet, Rfl: 2   aspirin 81 MG chewable tablet, Chew 1 tablet by mouth daily., Disp: , Rfl:    clopidogrel (PLAVIX) 75 MG tablet, Take 1 tablet by mouth once daily, Disp: 30 tablet, Rfl: 0   FARXIGA 10 MG TABS tablet, Take 1 tablet by mouth  once daily, Disp: 30 tablet, Rfl: 0   fenofibrate (TRICOR) 145 MG tablet, Take 1 tablet (145 mg total) by mouth daily., Disp: 90 tablet, Rfl: 3   furosemide (LASIX) 20 MG tablet, Take 1 tablet (20 mg total) by mouth daily., Disp: 90 tablet, Rfl: 3   HUMALOG KWIKPEN 100 UNIT/ML KwikPen, Inject 10-50 Units into the skin at bedtime. Sliding scale, Disp: 15 mL, Rfl: 12   LORazepam (ATIVAN) 1 MG tablet, Take 1 tablet (1 mg total) by mouth as needed for anxiety., Disp: 30 tablet, Rfl: 0   metFORMIN (GLUCOPHAGE) 500 MG tablet, TAKE 1 TABLET BY MOUTH TWICE DAILY WITH A MEAL, Disp: 180 tablet, Rfl: 0   metoprolol succinate (TOPROL-XL) 25 MG 24 hr tablet, Take 1 tablet (25 mg total) by mouth in the morning, at noon, and at bedtime., Disp: 270 tablet, Rfl: 2   neomycin-polymyxin b-dexamethasone (MAXITROL) 3.5-10000-0.1 OINT, , Disp: , Rfl:    nitroGLYCERIN (NITROSTAT) 0.4 MG SL tablet, Place 1 tablet (0.4 mg total) under the tongue every 5 (five) minutes as needed  for chest pain., Disp: 25 tablet, Rfl: 6   nystatin (MYCOSTATIN/NYSTOP) powder, Apply 1 Application topically daily as needed (rash/yeast)., Disp: 15 g, Rfl: 2   Omega-3 Fatty Acids (FISH OIL) 1000 MG CAPS, Take by mouth., Disp: , Rfl:    omeprazole (PRILOSEC) 20 MG capsule, Take 1 capsule (20 mg total) by mouth daily., Disp: 90 capsule, Rfl: 0   promethazine (PHENERGAN) 25 MG tablet, Take 25 mg by mouth every 6 (six) hours as needed for nausea or vomiting., Disp: , Rfl:    rosuvastatin (CRESTOR) 20 MG tablet, Take 1 tablet by mouth once daily, Disp: 30 tablet, Rfl: 0   senna-docusate (SENOKOT-S) 8.6-50 MG tablet, Take 1 tablet by mouth daily., Disp: , Rfl:    valsartan (DIOVAN) 80 MG tablet, Take 1 tablet by mouth twice daily, Disp: 60 tablet, Rfl: 0   vitamin C (ASCORBIC ACID) 500 MG tablet, Take 500 mg by mouth daily., Disp: , Rfl:    Vitamin D, Ergocalciferol, (DRISDOL) 1.25 MG (50000 UNIT) CAPS capsule, TAKE 1 CAPSULE BY MOUTH ONCE A WEEK ON  SATURDAY, Disp: 12 capsule, Rfl: 0   Evolocumab (REPATHA SURECLICK) 140 MG/ML SOAJ, Inject into the skin. (Patient not taking: Reported on 04/18/2023), Disp: , Rfl:    insulin glargine, 2 Unit Dial, (TOUJEO MAX SOLOSTAR) 300 UNIT/ML Solostar Pen, Administer 54 units qd, Disp: 15 mL, Rfl: 1  Past Medical History:  Diagnosis Date   Abnormal EKG 10/23/2021   Abnormal mammogram of left breast 09/04/2021   Abnormal stress test 10/23/2021   Acute chest pain 05/08/2016   AICD (automatic cardioverter/defibrillator) present    St Jude/Abbott device   AKI (acute kidney injury) (HCC) 10/23/2021   Angina pectoris (HCC) 09/11/2017   Anxiety 10/23/2021   Bacteremia 11/22/2021   Breast wound, right, subsequent encounter 09/04/2021   Burping 05/08/2016   Cancer (HCC)    cervical - hysterectomy   Cellulitis of right breast 10/23/2021   Cellulitis, wound, post-operative 10/09/2021   Chronic diastolic heart failure (HCC) 11/06/2016   Coronary artery disease involving  native coronary artery of native heart with angina pectoris (HCC) 09/12/2015   Overview:  PCI and stent of RCA 2009, last cath 2012 with medical therapy  CABG May 2017   Demand ischemia    Diabetic foot infection (HCC) 11/21/2021   Diabetic foot ulcer (HCC) 02/16/2020   Emphysema lung (HCC) 09/11/2017   patient not aware of this  dx   Essential hypertension 09/12/2015   Gangrene (HCC) 10/23/2021   GERD (gastroesophageal reflux disease) 05/08/2016   Hematoma 10/23/2021   Hiatal hernia 10/23/2021   Hyperglycemia 10/23/2021   Hyperglycemia due to type 2 diabetes mellitus (HCC) 10/23/2021   Hyperlipidemia 09/12/2015   Hyponatremia 10/23/2021   ICD (implantable cardioverter-defibrillator) in place 05/16/2021   Infection of great toe 10/23/2021   Lactic acidosis 10/23/2021   Leukocytosis 10/23/2021   Mediastinitis 06/28/2016   Morbid obesity (HCC) 05/08/2016   Myocardial infarction [facility])    NSTEMI (non-ST elevated myocardial infarction) (HCC) 05/22/2020   Obesity (BMI 30-39.9) 10/23/2021   Perineal abscess 10/23/2021   Peripheral vascular disease (HCC)    S/P CABG (coronary artery bypass graft) 06/03/2016   Overview:  The patient underwent sternal reconstruction on 06/21/16 with pec flaps for mediastinitis from a prior CABG in May 2017. On admission, she was critically ill from sepsis and had altered mental status. She was last seen in clinic on 08/09/16 at which time she was doing well.   Severe sepsis (HCC) 06/03/2016   Sinus tachycardia 05/08/2016   Tobacco use disorder 04/20/2016   Overview:  Quit in May 2017.   Type 2 diabetes mellitus with foot ulcer (HCC) 05/08/2016   Unstable angina (HCC) 05/22/2020   Ventricular tachycardia (HCC) 12/17/2020   Wound, surgical, infected 06/06/2016   Overview:  sternal   Objective:  PHYSICAL EXAM:   BP 122/82 (BP Location: Left Arm, Patient Position: Sitting, Cuff Size: Large)   Pulse 81   Temp (!) 97.4 F (36.3 C) (Temporal)   Ht 5\' 7"  (1.702 m)   Wt 227 lb  (103 kg)   LMP  (LMP Unknown)   SpO2 (!) 6%   BMI 35.55 kg/m    GEN: Well nourished, well developed, in no acute distress  Cardiac: RRR; no murmurs, rubs, or gallops,no edema - Respiratory:  normal respiratory rate and pattern with no distress - normal breath sounds with no rales, rhonchi, wheezes or rubs Skin: warm and dry, no rash  Psych: euthymic mood, appropriate affect and demeanor  EKG - no acute or new changes from prior EKG 3/24 Assessment & Plan:    Diabetes mellitus due to underlying condition with hyperglycemia, with long-term current use of insulin (HCC) Increase toujeo to 54 units qd Do sliding scale insulin as directed before meals Keep glucose diary Essential hypertension Continue meds Coronary artery disease involving native coronary artery of native heart with angina pectoris (HCC) Continue meds and follow up with cardiology as directed    Follow-up: Return in about 2 months (around 06/18/2023) for chronic fasting follow-up.  An After Visit Summary was printed and given to the patient.  Jettie Pagan [facility] [phone]

## 2023-04-29 ENCOUNTER — Other Ambulatory Visit: Payer: Self-pay | Admitting: Cardiology

## 2023-04-29 ENCOUNTER — Telehealth: Payer: Self-pay | Admitting: Cardiology

## 2023-04-29 ENCOUNTER — Other Ambulatory Visit: Payer: Self-pay | Admitting: Physician Assistant

## 2023-04-29 DIAGNOSIS — E0865 Diabetes mellitus due to underlying condition with hyperglycemia: Secondary | ICD-10-CM

## 2023-04-29 MED ORDER — VALSARTAN 80 MG PO TABS: 80.0000 mg | ORAL_TABLET | Freq: Two times a day (BID) | ORAL | 2 refills | Status: DC

## 2023-04-29 MED ORDER — ROSUVASTATIN CALCIUM 20 MG PO TABS: 20.0000 mg | ORAL_TABLET | Freq: Every day | ORAL | 2 refills | Status: DC

## 2023-04-29 MED ORDER — CLOPIDOGREL BISULFATE 75 MG PO TABS: 75.0000 mg | ORAL_TABLET | Freq: Every day | ORAL | 2 refills | Status: DC

## 2023-04-29 NOTE — Telephone Encounter (Signed)
*  STAT* If patient is at the pharmacy, call can be transferred to refill team.   1. Which medications need to be refilled? (please list name of each medication and dose if known)   rosuvastatin (CRESTOR) 20 MG tablet  clopidogrel (PLAVIX) 75 MG tablet  valsartan (DIOVAN) 80 MG tablet   2. Which pharmacy/location (including street and city if local pharmacy) is medication to be sent to? [facility] - [place], [place] - [address]    3. Do they need a 30 day or 90 day supply? 90

## 2023-04-29 NOTE — Telephone Encounter (Signed)
Refills of Crestor 20 mg, Plavix 75 mg, and Valsartan 80 mg sent to [facility], [place].

## 2023-04-30 NOTE — Progress Notes (Signed)
Remote ICD transmission.   

## 2023-05-09 DIAGNOSIS — L89893 Pressure ulcer of other site, stage 3: Secondary | ICD-10-CM | POA: Diagnosis not present

## 2023-05-09 DIAGNOSIS — L97522 Non-pressure chronic ulcer of other part of left foot with fat layer exposed: Secondary | ICD-10-CM | POA: Diagnosis not present

## 2023-05-16 DIAGNOSIS — L89893 Pressure ulcer of other site, stage 3: Secondary | ICD-10-CM | POA: Diagnosis not present

## 2023-05-16 DIAGNOSIS — L97522 Non-pressure chronic ulcer of other part of left foot with fat layer exposed: Secondary | ICD-10-CM | POA: Diagnosis not present

## 2023-05-24 DIAGNOSIS — L97522 Non-pressure chronic ulcer of other part of left foot with fat layer exposed: Secondary | ICD-10-CM | POA: Diagnosis not present

## 2023-05-24 DIAGNOSIS — L89893 Pressure ulcer of other site, stage 3: Secondary | ICD-10-CM | POA: Diagnosis not present

## 2023-05-27 ENCOUNTER — Ambulatory Visit (INDEPENDENT_AMBULATORY_CARE_PROVIDER_SITE_OTHER): Payer: BC Managed Care – PPO | Admitting: Podiatry

## 2023-05-27 DIAGNOSIS — Z91199 Patient's noncompliance with other medical treatment and regimen due to unspecified reason: Secondary | ICD-10-CM

## 2023-05-27 NOTE — Progress Notes (Signed)
Pt was a no show for apt, CG 

## 2023-05-31 DIAGNOSIS — L97522 Non-pressure chronic ulcer of other part of left foot with fat layer exposed: Secondary | ICD-10-CM | POA: Diagnosis not present

## 2023-05-31 DIAGNOSIS — L89893 Pressure ulcer of other site, stage 3: Secondary | ICD-10-CM | POA: Diagnosis not present

## 2023-06-07 DIAGNOSIS — L89893 Pressure ulcer of other site, stage 3: Secondary | ICD-10-CM | POA: Diagnosis not present

## 2023-06-13 DIAGNOSIS — L89893 Pressure ulcer of other site, stage 3: Secondary | ICD-10-CM | POA: Diagnosis not present

## 2023-06-13 DIAGNOSIS — L97522 Non-pressure chronic ulcer of other part of left foot with fat layer exposed: Secondary | ICD-10-CM | POA: Diagnosis not present

## 2023-06-18 ENCOUNTER — Ambulatory Visit (INDEPENDENT_AMBULATORY_CARE_PROVIDER_SITE_OTHER): Payer: BC Managed Care – PPO | Admitting: Podiatry

## 2023-06-18 DIAGNOSIS — Z91199 Patient's noncompliance with other medical treatment and regimen due to unspecified reason: Secondary | ICD-10-CM

## 2023-06-18 NOTE — Progress Notes (Signed)
Pt was a no show for apt, ns50

## 2023-06-19 DIAGNOSIS — L97522 Non-pressure chronic ulcer of other part of left foot with fat layer exposed: Secondary | ICD-10-CM | POA: Diagnosis not present

## 2023-06-19 DIAGNOSIS — L89893 Pressure ulcer of other site, stage 3: Secondary | ICD-10-CM | POA: Diagnosis not present

## 2023-06-27 DIAGNOSIS — L97522 Non-pressure chronic ulcer of other part of left foot with fat layer exposed: Secondary | ICD-10-CM | POA: Diagnosis not present

## 2023-06-27 DIAGNOSIS — L89893 Pressure ulcer of other site, stage 3: Secondary | ICD-10-CM | POA: Diagnosis not present

## 2023-07-02 ENCOUNTER — Ambulatory Visit: Payer: BC Managed Care – PPO | Admitting: Physician Assistant

## 2023-07-11 DIAGNOSIS — L89893 Pressure ulcer of other site, stage 3: Secondary | ICD-10-CM | POA: Diagnosis not present

## 2023-07-14 ENCOUNTER — Other Ambulatory Visit: Payer: Self-pay | Admitting: Physician Assistant

## 2023-07-14 DIAGNOSIS — K219 Gastro-esophageal reflux disease without esophagitis: Secondary | ICD-10-CM

## 2023-07-15 NOTE — Telephone Encounter (Signed)
Call pt - overdue for fasting follow up - need to schedule then will send in refill

## 2023-07-18 DIAGNOSIS — L89893 Pressure ulcer of other site, stage 3: Secondary | ICD-10-CM | POA: Diagnosis not present

## 2023-07-18 DIAGNOSIS — E119 Type 2 diabetes mellitus without complications: Secondary | ICD-10-CM | POA: Diagnosis not present

## 2023-07-22 ENCOUNTER — Other Ambulatory Visit: Payer: Self-pay | Admitting: Physician Assistant

## 2023-07-25 DIAGNOSIS — L97522 Non-pressure chronic ulcer of other part of left foot with fat layer exposed: Secondary | ICD-10-CM | POA: Diagnosis not present

## 2023-07-25 DIAGNOSIS — E119 Type 2 diabetes mellitus without complications: Secondary | ICD-10-CM | POA: Diagnosis not present

## 2023-07-25 DIAGNOSIS — L89893 Pressure ulcer of other site, stage 3: Secondary | ICD-10-CM | POA: Diagnosis not present

## 2023-07-29 ENCOUNTER — Ambulatory Visit: Payer: BC Managed Care – PPO | Admitting: Podiatry

## 2023-07-29 DIAGNOSIS — E0843 Diabetes mellitus due to underlying condition with diabetic autonomic (poly)neuropathy: Secondary | ICD-10-CM

## 2023-07-29 DIAGNOSIS — M21172 Varus deformity, not elsewhere classified, left ankle: Secondary | ICD-10-CM

## 2023-07-29 DIAGNOSIS — L97422 Non-pressure chronic ulcer of left heel and midfoot with fat layer exposed: Secondary | ICD-10-CM | POA: Diagnosis not present

## 2023-07-29 NOTE — Progress Notes (Unsigned)
Subjective:  [name], female    DOB: 07-Sep-1961,  MRN: [id number]  No chief complaint on file.   62 y.o. female presents for follow-up of left midfoot ulceration.  She has been going to [place] [facility].   Past Medical History:  Diagnosis Date   Abnormal EKG 10/23/2021   Abnormal mammogram of left breast 09/04/2021   Abnormal stress test 10/23/2021   Acute chest pain 05/08/2016   AICD (automatic cardioverter/defibrillator) present    St Jude/Abbott device   AKI (acute kidney injury) (HCC) 10/23/2021   Angina pectoris (HCC) 09/11/2017   Anxiety 10/23/2021   Bacteremia 11/22/2021   Breast wound, right, subsequent encounter 09/04/2021   Burping 05/08/2016   Cancer (HCC)    cervical - hysterectomy   Cellulitis of right breast 10/23/2021   Cellulitis, wound, post-operative 10/09/2021   Chronic diastolic heart failure (HCC) 11/06/2016   Coronary artery disease involving native coronary artery of native heart with angina pectoris (HCC) 09/12/2015   Overview:  PCI and stent of RCA 2009, last cath 2012 with medical therapy  CABG May 2017   Demand ischemia    Diabetic foot infection (HCC) 11/21/2021   Diabetic foot ulcer (HCC) 02/16/2020   Emphysema lung (HCC) 09/11/2017   patient not aware of this dx   Essential hypertension 09/12/2015   Gangrene (HCC) 10/23/2021   GERD (gastroesophageal reflux disease) 05/08/2016   Hematoma 10/23/2021   Hiatal hernia 10/23/2021   Hyperglycemia 10/23/2021   Hyperglycemia due to type 2 diabetes mellitus (HCC) 10/23/2021   Hyperlipidemia 09/12/2015   Hyponatremia 10/23/2021   ICD (implantable cardioverter-defibrillator) in place 05/16/2021   Infection of great toe 10/23/2021   Lactic acidosis 10/23/2021   Leukocytosis 10/23/2021   Mediastinitis 06/28/2016   Morbid obesity (HCC) 05/08/2016   Myocardial infarction [facility])    NSTEMI (non-ST elevated myocardial infarction) (HCC) 05/22/2020   Obesity (BMI 30-39.9) 10/23/2021   Perineal abscess  10/23/2021   Peripheral vascular disease (HCC)    S/P CABG (coronary artery bypass graft) 06/03/2016   Overview:  The patient underwent sternal reconstruction on 06/21/16 with pec flaps for mediastinitis from a prior CABG in May 2017. On admission, she was critically ill from sepsis and had altered mental status. She was last seen in clinic on 08/09/16 at which time she was doing well.   Severe sepsis (HCC) 06/03/2016   Sinus tachycardia 05/08/2016   Tobacco use disorder 04/20/2016   Overview:  Quit in May 2017.   Type 2 diabetes mellitus with foot ulcer (HCC) 05/08/2016   Unstable angina (HCC) 05/22/2020   Ventricular tachycardia (HCC) 12/17/2020   Wound, surgical, infected 06/06/2016   Overview:  sternal    Allergies  Allergen Reactions   Vancomycin Anaphylaxis   Chlorhexidine Gluconate Itching    Received CHG bath, began itching, required benadryl   Cat Hair Extract Other (See Comments)    Sneezing, watery eyes.   Tramadol Other (See Comments)    Hallucinations   Codeine Rash    ROS: Negative except as per HPI above  Objective:  General: AAO x3, NAD  Dermatological: Ulceration present to the plantar lateral midfoot of the left foot sub cuboid.  Upon debridement of the ulceration there is no to be healthy granular tissue present at the wound base.  There was hyperkeratotic tissue overlying.  No maceration is noted.  No malodor drainage or deep sinus tract.  No erythema.  Wound measures approximately 1 cm x 0.9 cm x 0.4 cm postdebridement.  Vascular:  Dorsalis Pedis artery and Posterior Tibial artery pedal pulses are 2/4 bilateral.     Neruologic: Grossly absent to BLE  Musculoskeletal: S/p LLE TMA well healed at amputation site, varus deformity at STJ with flexible rearfoot, able to be placed into neutral with minimal force. Equinus deformity present with decreased ankle DF rom avialable  Gait: Unassisted, Nonantalgic.     Radiographs:  Date: 09/26/22 XR the left foot  Weightbearing AP/Lateral/Oblique   Findings: s/p TMA Left foot, no evidence of osteolysis at the metatarsal stump sites. No evidence OM.  Assessment:   1. Ulcer of left heel and midfoot with fat layer exposed (HCC)   2. Varus foot deformity, acquired, left   3. Diabetes mellitus due to underlying condition with diabetic autonomic neuropathy, unspecified whether long term insulin use (HCC)      Plan:  Patient was evaluated and treated and all questions answered.  # Ulcer of the plantar lateral midfoot with fat layer exposed -    No follow-ups on file.          Corinna Gab, DPM [facility] / [facility]

## 2023-08-01 DIAGNOSIS — L97522 Non-pressure chronic ulcer of other part of left foot with fat layer exposed: Secondary | ICD-10-CM | POA: Diagnosis not present

## 2023-08-01 DIAGNOSIS — E119 Type 2 diabetes mellitus without complications: Secondary | ICD-10-CM | POA: Diagnosis not present

## 2023-08-01 DIAGNOSIS — L89893 Pressure ulcer of other site, stage 3: Secondary | ICD-10-CM | POA: Diagnosis not present

## 2023-08-06 DIAGNOSIS — E119 Type 2 diabetes mellitus without complications: Secondary | ICD-10-CM | POA: Diagnosis not present

## 2023-08-06 DIAGNOSIS — L97522 Non-pressure chronic ulcer of other part of left foot with fat layer exposed: Secondary | ICD-10-CM | POA: Diagnosis not present

## 2023-08-06 DIAGNOSIS — I872 Venous insufficiency (chronic) (peripheral): Secondary | ICD-10-CM | POA: Diagnosis not present

## 2023-08-06 DIAGNOSIS — L89893 Pressure ulcer of other site, stage 3: Secondary | ICD-10-CM | POA: Diagnosis not present

## 2023-08-09 ENCOUNTER — Encounter: Payer: Self-pay | Admitting: Physician Assistant

## 2023-08-09 ENCOUNTER — Ambulatory Visit: Payer: BC Managed Care – PPO | Admitting: Physician Assistant

## 2023-08-09 VITALS — BP 110/78 | HR 60 | Temp 97.2°F | Ht 67.0 in | Wt 220.4 lb

## 2023-08-09 DIAGNOSIS — R928 Other abnormal and inconclusive findings on diagnostic imaging of breast: Secondary | ICD-10-CM

## 2023-08-09 DIAGNOSIS — E0865 Diabetes mellitus due to underlying condition with hyperglycemia: Secondary | ICD-10-CM | POA: Diagnosis not present

## 2023-08-09 DIAGNOSIS — Z1211 Encounter for screening for malignant neoplasm of colon: Secondary | ICD-10-CM

## 2023-08-09 DIAGNOSIS — E782 Mixed hyperlipidemia: Secondary | ICD-10-CM

## 2023-08-09 DIAGNOSIS — E1165 Type 2 diabetes mellitus with hyperglycemia: Secondary | ICD-10-CM | POA: Diagnosis not present

## 2023-08-09 DIAGNOSIS — N3001 Acute cystitis with hematuria: Secondary | ICD-10-CM | POA: Diagnosis not present

## 2023-08-09 DIAGNOSIS — E559 Vitamin D deficiency, unspecified: Secondary | ICD-10-CM

## 2023-08-09 DIAGNOSIS — L97529 Non-pressure chronic ulcer of other part of left foot with unspecified severity: Secondary | ICD-10-CM

## 2023-08-09 DIAGNOSIS — E11622 Type 2 diabetes mellitus with other skin ulcer: Secondary | ICD-10-CM

## 2023-08-09 DIAGNOSIS — I10 Essential (primary) hypertension: Secondary | ICD-10-CM

## 2023-08-09 DIAGNOSIS — S98111A Complete traumatic amputation of right great toe, initial encounter: Secondary | ICD-10-CM

## 2023-08-09 DIAGNOSIS — I5032 Chronic diastolic (congestive) heart failure: Secondary | ICD-10-CM | POA: Diagnosis not present

## 2023-08-09 DIAGNOSIS — I110 Hypertensive heart disease with heart failure: Secondary | ICD-10-CM

## 2023-08-09 DIAGNOSIS — Z794 Long term (current) use of insulin: Secondary | ICD-10-CM

## 2023-08-09 DIAGNOSIS — I25119 Atherosclerotic heart disease of native coronary artery with unspecified angina pectoris: Secondary | ICD-10-CM

## 2023-08-09 DIAGNOSIS — K219 Gastro-esophageal reflux disease without esophagitis: Secondary | ICD-10-CM

## 2023-08-09 DIAGNOSIS — F419 Anxiety disorder, unspecified: Secondary | ICD-10-CM

## 2023-08-09 DIAGNOSIS — I739 Peripheral vascular disease, unspecified: Secondary | ICD-10-CM

## 2023-08-09 HISTORY — DX: Other abnormal and inconclusive findings on diagnostic imaging of breast: R92.8

## 2023-08-09 HISTORY — DX: Acute cystitis with hematuria: N30.01

## 2023-08-09 LAB — POCT URINALYSIS DIP (CLINITEK)
Bilirubin, UA: NEGATIVE
Glucose, UA: >=1000 mg/dL — AB
Ketones, POC UA: NEGATIVE mg/dL
Nitrite, UA: POSITIVE — AB
POC PROTEIN,UA: 100 — AB
Spec Grav, UA: 1.025 (ref 1.010–1.025)
Urobilinogen, UA: 0.2 E.U./dL
pH, UA: 5.5 (ref 5.0–8.0)

## 2023-08-09 MED ORDER — NITROFURANTOIN MONOHYD MACRO 100 MG PO CAPS
100.0000 mg | ORAL_CAPSULE | Freq: Two times a day (BID) | ORAL | 0 refills | Status: DC
Start: 2023-08-09 — End: 2023-10-08

## 2023-08-09 MED ORDER — REPATHA SURECLICK 140 MG/ML ~~LOC~~ SOAJ
140.0000 mg | SUBCUTANEOUS | 1 refills | Status: DC
Start: 2023-08-09 — End: 2024-03-13

## 2023-08-09 MED ORDER — OMEPRAZOLE 20 MG PO CPDR
20.0000 mg | DELAYED_RELEASE_CAPSULE | Freq: Every day | ORAL | 1 refills | Status: DC
Start: 2023-08-09 — End: 2024-05-26

## 2023-08-09 NOTE — Progress Notes (Signed)
Subjective:  [name], female    DOB: 01/26/1961  Age: 62 y.o. MRN: [id number]  Chief Complaint  Patient presents with   Medical Management of Chronic Issues    Diabetes  Hypertension    Ailie Peppe has a history of type 2 Diabetes Mellitus for more than 20 years. Current treatment includes toujeo 54 units qd.  humalog sliding scale, farxiga and glucophage   She denies hypoglycemic episodes. She states blood glucose levels at home range from 130 to 150 fasting. She  is consuming a heart healthy diet a She performs diabetic foot checks daily after showering. She is following with [facility] every 2 weeks for foot care and currently has pressure ulcer on bottom of left foot with plans for surgery She is also seeing wound therapy Once again patient does not bring glucose diary for review She is also giving herself regular insulin before bedtime after eating about 4-6 units --- explained again that sliding scale insulin used according to glucose readings before meals only and if she continues to give fast acting insulin at bedtime she risks hypoglycemia in middle of night  Pt with history of hyperlipidemia and CAD- currently also follows with cardiology.  Treatment includes , tricor, fish oil and crestor 20mg  qd- Once again she states that she has been off of Repatha for more than 6 months because she has not gotten a refill of medication from cardiology however she never called their office to request - I will try to send in rx for her but stressed importance of her being compliant with medication  Pt with history of vit D disorder - taking weekly supplement and due for labwork  Pt with history of GERD - stable on omeprazole 20mg  qd - requests refill  Pt follows with cardiology on a regular basis.  She sees Dr Dulce Sellar for history of CAD, chronic diastolic heart failure,and history of vtach.  She is currently on amiodarone therapy and has implantable defibrillator (which is  monitored regularly).  She is also on  metoprolol, lasix, plavix,  Pt with history of hypertension as well and also taking diovan 80mg  qd She was recently seen in his office and follows every 6 months with appt in October  Pt with history of anxiety - stable on lorazepam 1mg  as needed  Pt is due for bilateral diagnostic mammogram - agreeable to schedule  Pt would like to schedule for screening colonoscopy with Dr Jennye Boroughs  Pt complains of uti symptoms for the past several days  Lab Results  Component Value Date   HGBA1C 8.8 (H) 03/19/2023   HGBA1C 7.3 (H) 09/20/2022   HGBA1C 8.9 (H) 05/22/2022    Current Outpatient Medications on File Prior to Visit  Medication Sig Dispense Refill   acetaminophen (TYLENOL) 325 MG tablet Take 2 tablets (650 mg total) by mouth every 4 (four) hours as needed for headache or mild pain.     amiodarone (PACERONE) 200 MG tablet Take 1 tablet by mouth once daily 90 tablet 2   aspirin 81 MG chewable tablet Chew 1 tablet by mouth daily.     clopidogrel (PLAVIX) 75 MG tablet Take 1 tablet (75 mg total) by mouth daily. 90 tablet 2   FARXIGA 10 MG TABS tablet Take 1 tablet by mouth once daily 90 tablet 0   fenofibrate (TRICOR) 145 MG tablet Take 1 tablet (145 mg total) by mouth daily. 90 tablet 3   furosemide (LASIX) 20 MG tablet Take 1 tablet (20  mg total) by mouth daily. 90 tablet 3   HUMALOG KWIKPEN 100 UNIT/ML KwikPen Inject 10-50 Units into the skin at bedtime. Sliding scale 15 mL 12   insulin glargine, 2 Unit Dial, (TOUJEO MAX SOLOSTAR) 300 UNIT/ML Solostar Pen Administer 54 units qd 15 mL 1   LORazepam (ATIVAN) 1 MG tablet Take 1 tablet (1 mg total) by mouth as needed for anxiety. 30 tablet 0   metFORMIN (GLUCOPHAGE) 500 MG tablet TAKE 1 TABLET BY MOUTH TWICE DAILY WITH A MEAL 180 tablet 0   metoprolol succinate (TOPROL-XL) 25 MG 24 hr tablet Take 1 tablet (25 mg total) by mouth in the morning, at noon, and at bedtime. 270 tablet 2   neomycin-polymyxin  b-dexamethasone (MAXITROL) 3.5-10000-0.1 OINT      nitroGLYCERIN (NITROSTAT) 0.4 MG SL tablet Place 1 tablet (0.4 mg total) under the tongue every 5 (five) minutes as needed for chest pain. 25 tablet 6   nystatin (MYCOSTATIN/NYSTOP) powder Apply 1 Application topically daily as needed (rash/yeast). 15 g 2   Omega-3 Fatty Acids (FISH OIL) 1000 MG CAPS Take by mouth.     promethazine (PHENERGAN) 25 MG tablet Take 25 mg by mouth every 6 (six) hours as needed for nausea or vomiting.     rosuvastatin (CRESTOR) 20 MG tablet Take 1 tablet (20 mg total) by mouth daily. 90 tablet 2   senna-docusate (SENOKOT-S) 8.6-50 MG tablet Take 1 tablet by mouth daily.     valsartan (DIOVAN) 80 MG tablet Take 1 tablet (80 mg total) by mouth 2 (two) times daily. 180 tablet 2   vitamin C (ASCORBIC ACID) 500 MG tablet Take 500 mg by mouth daily.     Vitamin D, Ergocalciferol, (DRISDOL) 1.25 MG (50000 UNIT) CAPS capsule TAKE 1 CAPSULE BY MOUTH EVERY SATURDAY 12 capsule 0   No current facility-administered medications on file prior to visit.   Past Medical History:  Diagnosis Date   Abnormal EKG 10/23/2021   Abnormal mammogram of left breast 09/04/2021   Abnormal stress test 10/23/2021   Acute chest pain 05/08/2016   AICD (automatic cardioverter/defibrillator) present    St Jude/Abbott device   AKI (acute kidney injury) (HCC) 10/23/2021   Angina pectoris (HCC) 09/11/2017   Anxiety 10/23/2021   Bacteremia 11/22/2021   Breast wound, right, subsequent encounter 09/04/2021   Burping 05/08/2016   Cancer (HCC)    cervical - hysterectomy   Cellulitis of right breast 10/23/2021   Cellulitis, wound, post-operative 10/09/2021   Chronic diastolic heart failure (HCC) 11/06/2016   Coronary artery disease involving native coronary artery of native heart with angina pectoris (HCC) 09/12/2015   Overview:  PCI and stent of RCA 2009, last cath 2012 with medical therapy  CABG May 2017   Demand ischemia    Diabetic foot infection (HCC)  11/21/2021   Diabetic foot ulcer (HCC) 02/16/2020   Emphysema lung (HCC) 09/11/2017   patient not aware of this dx   Essential hypertension 09/12/2015   Gangrene (HCC) 10/23/2021   GERD (gastroesophageal reflux disease) 05/08/2016   Hematoma 10/23/2021   Hiatal hernia 10/23/2021   Hyperglycemia 10/23/2021   Hyperglycemia due to type 2 diabetes mellitus (HCC) 10/23/2021   Hyperlipidemia 09/12/2015   Hyponatremia 10/23/2021   ICD (implantable cardioverter-defibrillator) in place 05/16/2021   Infection of great toe 10/23/2021   Lactic acidosis 10/23/2021   Leukocytosis 10/23/2021   Mediastinitis 06/28/2016   Morbid obesity (HCC) 05/08/2016   Myocardial infarction [facility])    NSTEMI (non-ST elevated myocardial infarction) (HCC) 05/22/2020  Obesity (BMI 30-39.9) 10/23/2021   Perineal abscess 10/23/2021   Peripheral vascular disease (HCC)    S/P CABG (coronary artery bypass graft) 06/03/2016   Overview:  The patient underwent sternal reconstruction on 06/21/16 with pec flaps for mediastinitis from a prior CABG in May 2017. On admission, she was critically ill from sepsis and had altered mental status. She was last seen in clinic on 08/09/16 at which time she was doing well.   Severe sepsis (HCC) 06/03/2016   Sinus tachycardia 05/08/2016   Tobacco use disorder 04/20/2016   Overview:  Quit in May 2017.   Type 2 diabetes mellitus with foot ulcer (HCC) 05/08/2016   Unstable angina (HCC) 05/22/2020   Ventricular tachycardia (HCC) 12/17/2020   Wound, surgical, infected 06/06/2016   Overview:  sternal   Past Surgical History:  Procedure Laterality Date   ABDOMINAL AORTOGRAM W/LOWER EXTREMITY N/A 10/11/2021   Procedure: ABDOMINAL AORTOGRAM W/LOWER EXTREMITY;  Surgeon: Cephus Shelling, MD;  Location: [facility];  Service: Cardiovascular;  Laterality: N/A;   AMPUTATION TOE Right 02/20/2020   Procedure: AMPUTATION RIGHT GREAT  TOE;  Surgeon: Candelaria Stagers, DPM;  Location: [facility] OR;  Service: Podiatry;   Laterality: Right;   BLADDER SURGERY     BONE BIOPSY Left 02/01/2022   Procedure: BONE BIOPSY LEFT FOOT, IRRIGATION AND DEBRIDEMENT;  Surgeon: Asencion Islam, DPM;  Location: [facility] OR;  Service: Podiatry;  Laterality: Left;   BUBBLE STUDY  11/27/2021   Procedure: BUBBLE STUDY;  Surgeon: Pricilla Riffle, MD;  Location: [facility] ENDOSCOPY;  Service: Cardiovascular;;   CARDIAC CATHETERIZATION     CORONARY ARTERY BYPASS GRAFT     CORONARY STENT INTERVENTION N/A 05/25/2020   Procedure: CORONARY STENT INTERVENTION;  Surgeon: Iran Ouch, MD;  Location: [facility];  Service: Cardiovascular;  Laterality: N/A;   CORONARY STENT INTERVENTION N/A 06/22/2020   Procedure: CORONARY STENT INTERVENTION;  Surgeon: Marykay Lex, MD;  Location: [facility] INVASIVE CV LAB;  Service: Cardiovascular;  Laterality: N/A;   CORONARY ULTRASOUND/IVUS N/A 06/22/2020   Procedure: Intravascular Ultrasound/IVUS;  Surgeon: Marykay Lex, MD;  Location: [facility] INVASIVE CV LAB;  Service: Cardiovascular;  Laterality: N/A;   ICD IMPLANT N/A 12/19/2020   Procedure: ICD IMPLANT;  Surgeon: Marinus Maw, MD;  Location: [facility] INVASIVE CV LAB;  Service: Cardiovascular;  Laterality: N/A;   INCISION AND DRAINAGE Right 02/19/2020   Procedure: INCISION AND DRAINAGE;  Surgeon: Vivi Barrack, DPM;  Location: [facility] OR;  Service: Podiatry;  Laterality: Right;  Block done by surgeon   IRRIGATION AND DEBRIDEMENT FOOT Left 10/10/2021   Procedure: IRRIGATION AND DEBRIDEMENT FOOT;  Surgeon: Asencion Islam, DPM;  Location: [facility];  Service: Podiatry;  Laterality: Left;   IRRIGATION AND DEBRIDEMENT FOOT Left 11/23/2021   Procedure: IRRIGATION AND DEBRIDEMENT FOOT;  Surgeon: Louann Sjogren, MD;  Location: [facility];  Service: Podiatry;  Laterality: Left;  will do local block   LEFT HEART CATH AND CORS/GRAFTS ANGIOGRAPHY N/A 05/23/2020   Procedure: LEFT HEART CATH AND CORS/GRAFTS ANGIOGRAPHY;  Surgeon: Yvonne Kendall, MD;  Location: [facility];  Service:  Cardiovascular;  Laterality: N/A;   LEFT HEART CATH AND CORS/GRAFTS ANGIOGRAPHY N/A 12/19/2020   Procedure: LEFT HEART CATH AND CORS/GRAFTS ANGIOGRAPHY;  Surgeon: Kathleene Hazel, MD;  Location: [facility];  Service: Cardiovascular;  Laterality: N/A;   LEFT HEART CATH AND CORS/GRAFTS ANGIOGRAPHY N/A 07/21/2021   Procedure: LEFT HEART CATH AND CORS/GRAFTS ANGIOGRAPHY;  Surgeon: Yvonne Kendall, MD;  Location: [facility];  Service: Cardiovascular;  Laterality: N/A;   METATARSAL HEAD EXCISION Right 05/02/2020   Procedure: FIRST METATARSAL HEAD RESECTION; RIGHT FOOT WOUND CLOSURE;  Surgeon: Asencion Islam, DPM;  Location: [facility];  Service: Podiatry;  Laterality: Right;  MAC W/LOCAL   PERIPHERAL VASCULAR BALLOON ANGIOPLASTY Left 10/11/2021   Procedure: PERIPHERAL VASCULAR BALLOON ANGIOPLASTY;  Surgeon: Cephus Shelling, MD;  Location: [facility];  Service: Cardiovascular;  Laterality: Left;  Anterior Tibial Artery   TEE WITHOUT CARDIOVERSION N/A 11/27/2021   Procedure: TRANSESOPHAGEAL ECHOCARDIOGRAM (TEE);  Surgeon: Pricilla Riffle, MD;  Location: [facility] ENDOSCOPY;  Service: Cardiovascular;  Laterality: N/A;   TRANSMETATARSAL AMPUTATION Left 11/23/2021   Procedure: TRANSMETATARSAL AMPUTATION;  Surgeon: Louann Sjogren, MD;  Location: [facility] OR;  Service: Podiatry;  Laterality: Left;   TUBAL LIGATION      Family History  Problem Relation Age of Onset   Hypertension Mother    Hyperlipidemia Mother    Heart attack Father    Heart disease Father    Hypertension Father    Alzheimer's disease Father    Hypertension Brother    Hyperlipidemia Brother    Social History   Socioeconomic History   Marital status: Married    Spouse name: Not on file   Number of children: Not on file   Years of education: Not on file   Highest education level: Not on file  Occupational History   Not on file  Tobacco Use   Smoking status: Former    Current packs/day: 0.00     Types: Cigarettes    Quit date: 05/2016    Years since quitting: 7.2   Smokeless tobacco: Never  Vaping Use   Vaping status: Never Used  Substance and Sexual Activity   Alcohol use: Yes    Comment: social wine   Drug use: No   Sexual activity: Yes    Birth control/protection: Surgical    Comment: Hysterectomy  Other Topics Concern   Not on file  Social History Narrative   Not on file   Social Determinants of Health   Financial Resource Strain: Not on file  Food Insecurity: Not on file  Transportation Needs: Not on file  Physical Activity: Not on file  Stress: Not on file  Social Connections: Not on file   CONSTITUTIONAL: Negative for chills, fatigue, fever, unintentional weight gain and unintentional weight loss.  E/N/T: Negative for ear pain, nasal congestion and sore throat.  CARDIOVASCULAR: Negative for chest pain, dizziness, palpitations and pedal edema.  RESPIRATORY: Negative for recent cough and dyspnea.  GASTROINTESTINAL: Negative for abdominal pain, acid reflux symptoms, constipation, diarrhea, nausea and vomiting.  MSK: see HPI INTEGUMENTARY: Negative for rash.  NEUROLOGICAL: Negative for dizziness and headaches.  PSYCHIATRIC: Negative for sleep disturbance and to question depression screen.  Negative for depression, negative for anhedonia.       Objective:  PHYSICAL EXAM:   VS: BP 110/78 (BP Location: Left Arm, Patient Position: Sitting, Cuff Size: Large)   Pulse 60   Temp (!) 97.2 F (36.2 C) (Temporal)   Ht 5\' 7"  (1.702 m)   Wt 220 lb 6.4 oz (100 kg)   LMP  (LMP Unknown)   SpO2 96%   BMI 34.52 kg/m   GEN: Well nourished, well developed, in no acute distress   Cardiac: RRR; no murmurs, rubs, or gallops,no edema - Respiratory:  normal respiratory rate and pattern with no distress - normal breath sounds with no rales, rhonchi,  wheezes or rubs  MS: left leg and foot bandaged Skin: warm and dry, no rash  Neuro:  Alert and Oriented x 3, - CN II-Xii  grossly intact Psych: euthymic mood, appropriate affect and demeanor Office Visit on 08/09/2023  Component Date Value Ref Range Status   Color, UA 08/09/2023 yellow  yellow Final   Clarity, UA 08/09/2023 cloudy (A)  clear Final   Glucose, UA 08/09/2023 >=1,000 (A)  negative mg/dL Final   Bilirubin, UA 44/02/4741 negative  negative Final   Ketones, POC UA 08/09/2023 negative  negative mg/dL Final   Spec Grav, UA 59/56/3875 1.025  1.010 - 1.025 Final   Blood, UA 08/09/2023 trace-intact (A)  negative Final   pH, UA 08/09/2023 5.5  5.0 - 8.0 Final   POC PROTEIN,UA 08/09/2023 =100 (A)  negative, trace Final   Urobilinogen, UA 08/09/2023 0.2  0.2 or 1.0 E.U./dL Final   Nitrite, UA 64/33/2951 Positive (A)  Negative Final   Leukocytes, UA 08/09/2023 Small (1+) (A)  Negative Final        Lab Results  Component Value Date   WBC 9.5 03/19/2023   HGB 12.3 03/19/2023   HCT 37.9 03/19/2023   PLT 278 03/19/2023   GLUCOSE 129 (H) 03/19/2023   CHOL 235 (H) 02/27/2023   TRIG 528 (H) 02/27/2023   HDL 60 02/27/2023   LDLDIRECT 91.4 05/22/2020   LDLCALC 89 02/27/2023   ALT 11 03/19/2023   AST 12 03/19/2023   NA 141 03/19/2023   K 5.2 03/19/2023   CL 105 03/19/2023   CREATININE 1.01 (H) 03/19/2023   BUN 20 03/19/2023   CO2 22 03/19/2023   TSH 1.970 09/20/2022   INR 1.1 07/20/2021   HGBA1C 8.8 (H) 03/19/2023   MICROALBUR 293.5 (H) 07/21/2021      Assessment & Plan:   Problem List Items Addressed This Visit       Cardiovascular and Mediastinum   Chronic diastolic heart failure (HCC)   Relevant Medications   nitroGLYCERIN (NITROSTAT) 0.4 MG SL tablet Continue meds Follow up with cardiology   Essential hypertension - Primary   Relevant Medications   nitroGLYCERIN (NITROSTAT) 0.4 MG SL tablet Continue all meds Follow with cardiology   Other Relevant Orders   CBC with Differential/Platelet   Comprehensive metabolic panel   TSH   Peripheral vascular disease (HCC)   Relevant  Medications   nitroGLYCERIN (NITROSTAT) 0.4 MG SL tablet     Digestive   GERD (gastroesophageal reflux disease)   Relevant Medications   omeprazole (PRILOSEC) 20 MG capsule     Endocrine   Diabetic foot ulcer (HCC)   Relevant Medications   insulin degludec (TRESIBA FLEXTOUCH) 100 UNIT/ML FlexTouch Pen Continue follow up with podiatry   Diabetes mellitus due to underlying condition with hyperglycemia, with long-term current use of insulin (HCC)   Relevant Medications   insulin degludec (TRESIBA FLEXTOUCH) 100 UNIT/ML FlexTouch Pen   Other Relevant Orders   CBC with Differential/Platelet   Comprehensive metabolic panel   TSH   Hemoglobin A1c   DISCUSSED IN DETAIL ABOUT DOSING SLIDING SCALE HUMALOG - ADJUST DOSE AS DESCRIBED IN HPI - KEEP RECORD OF GLUCOSE READINGS      Musculoskeletal and Integument   UTI Urine culture pending Rx for macrobid as directed           Other   Hyperlipidemia   Relevant Medications   nitroGLYCERIN (NITROSTAT) 0.4 MG SL tablet   Other Relevant Orders   Lipid panel Continue meds  recommend to continue Repatha   ICD (implantable cardioverter-defibrillator) in place   Relevant Medications   nitroGLYCERIN (NITROSTAT) 0.4 MG SL tablet Follow with cardiology   Anxiety Continue lorazepam   Partial nontraumatic amputation of left foot (HCC)   Vitamin D deficiency   Relevant Orders   VITAMIN D 25 Hydroxy (Vit-D Deficiency, Fractures)   Amputation of right great toe (HCC)      Need for colon cancer screening Referral to GI  Abnormal mammogram Bilateral diagnostic mammo ordered                                .  Meds ordered this encounter  Medications   Evolocumab (REPATHA SURECLICK) 140 MG/ML SOAJ    Sig: Inject 140 mg into the skin every 14 (fourteen) days.    Dispense:  2 mL    Refill:  1    Order Specific Question:   Supervising Provider    Answer:   Corey Harold   omeprazole (PRILOSEC) 20 MG capsule    Sig: Take 1  capsule (20 mg total) by mouth daily.    Dispense:  90 capsule    Refill:  1    Order Specific Question:   Supervising Provider    Answer:   Corey Harold   nitrofurantoin, macrocrystal-monohydrate, (MACROBID) 100 MG capsule    Sig: Take 1 capsule (100 mg total) by mouth 2 (two) times daily.    Dispense:  20 capsule    Refill:  0    Order Specific Question:   Supervising Provider    AnswerCorey Harold    Orders Placed This Encounter  Procedures   Urine Culture   MM Digital Diagnostic Bilat   CBC with Differential/Platelet   Comprehensive metabolic panel   Lipid panel   Hemoglobin A1c   VITAMIN D 25 Hydroxy (Vit-D Deficiency, Fractures)   TSH   Ambulatory referral to Gastroenterology   POCT URINALYSIS DIP (CLINITEK)     Follow-up: Return in about 3 months (around 11/09/2023) for chronic fasting follow-up - 40 min.  An After Visit Summary was printed and given to the patient.  Jettie Pagan [facility] [phone]

## 2023-08-10 LAB — COMPREHENSIVE METABOLIC PANEL
ALT: 13 IU/L (ref 0–32)
AST: 16 IU/L (ref 0–40)
Albumin: 4.2 g/dL (ref 3.9–4.9)
Alkaline Phosphatase: 66 IU/L (ref 44–121)
BUN/Creatinine Ratio: 22 (ref 12–28)
BUN: 27 mg/dL (ref 8–27)
Bilirubin Total: 0.2 mg/dL (ref 0.0–1.2)
CO2: 21 mmol/L (ref 20–29)
Calcium: 10.3 mg/dL (ref 8.7–10.3)
Chloride: 108 mmol/L — ABNORMAL HIGH (ref 96–106)
Creatinine, Ser: 1.23 mg/dL — ABNORMAL HIGH (ref 0.57–1.00)
Globulin, Total: 3 g/dL (ref 1.5–4.5)
Glucose: 135 mg/dL — ABNORMAL HIGH (ref 70–99)
Potassium: 5.6 mmol/L — ABNORMAL HIGH (ref 3.5–5.2)
Sodium: 144 mmol/L (ref 134–144)
Total Protein: 7.2 g/dL (ref 6.0–8.5)
eGFR: 50 mL/min/{1.73_m2} — ABNORMAL LOW (ref >59)

## 2023-08-10 LAB — CBC WITH DIFFERENTIAL/PLATELET
Basophils Absolute: 0.1 10*3/uL (ref 0.0–0.2)
Basos: 1 %
EOS (ABSOLUTE): 0.2 10*3/uL (ref 0.0–0.4)
Eos: 2 %
Hematocrit: 40.7 % (ref 34.0–46.6)
Hemoglobin: 12.7 g/dL (ref 11.1–15.9)
Immature Grans (Abs): 0 10*3/uL (ref 0.0–0.1)
Immature Granulocytes: 0 %
Lymphocytes Absolute: 2 10*3/uL (ref 0.7–3.1)
Lymphs: 19 %
MCH: 27.9 pg (ref 26.6–33.0)
MCHC: 31.2 g/dL — ABNORMAL LOW (ref 31.5–35.7)
MCV: 89 fL (ref 79–97)
Monocytes Absolute: 0.7 10*3/uL (ref 0.1–0.9)
Monocytes: 6 %
Neutrophils Absolute: 7.7 10*3/uL — ABNORMAL HIGH (ref 1.4–7.0)
Neutrophils: 72 %
Platelets: 268 10*3/uL (ref 150–450)
RBC: 4.56 x10E6/uL (ref 3.77–5.28)
RDW: 14.3 % (ref 11.7–15.4)
WBC: 10.7 10*3/uL (ref 3.4–10.8)

## 2023-08-10 LAB — HEMOGLOBIN A1C
Est. average glucose Bld gHb Est-mCnc: 177 mg/dL
Hgb A1c MFr Bld: 7.8 % — ABNORMAL HIGH (ref 4.8–5.6)

## 2023-08-10 LAB — LIPID PANEL
Chol/HDL Ratio: 3.3 ratio (ref 0.0–4.4)
Cholesterol, Total: 216 mg/dL — ABNORMAL HIGH (ref 100–199)
HDL: 66 mg/dL (ref >39)
LDL Chol Calc (NIH): 108 mg/dL — ABNORMAL HIGH (ref 0–99)
Triglycerides: 248 mg/dL — ABNORMAL HIGH (ref 0–149)
VLDL Cholesterol Cal: 42 mg/dL — ABNORMAL HIGH (ref 5–40)

## 2023-08-10 LAB — VITAMIN D 25 HYDROXY (VIT D DEFICIENCY, FRACTURES): Vit D, 25-Hydroxy: 28 ng/mL — ABNORMAL LOW (ref 30.0–100.0)

## 2023-08-10 LAB — LITHOLINK CKD PROGRAM

## 2023-08-10 LAB — TSH: TSH: 1.85 u[IU]/mL (ref 0.450–4.500)

## 2023-08-12 ENCOUNTER — Telehealth: Payer: Self-pay

## 2023-08-12 ENCOUNTER — Encounter: Payer: Self-pay | Admitting: Podiatry

## 2023-08-12 ENCOUNTER — Other Ambulatory Visit: Payer: Self-pay | Admitting: Physician Assistant

## 2023-08-12 ENCOUNTER — Ambulatory Visit: Payer: BC Managed Care – PPO | Admitting: Podiatry

## 2023-08-12 ENCOUNTER — Ambulatory Visit: Payer: BC Managed Care – PPO

## 2023-08-12 ENCOUNTER — Ambulatory Visit (INDEPENDENT_AMBULATORY_CARE_PROVIDER_SITE_OTHER): Payer: BC Managed Care – PPO

## 2023-08-12 DIAGNOSIS — L97522 Non-pressure chronic ulcer of other part of left foot with fat layer exposed: Secondary | ICD-10-CM

## 2023-08-12 DIAGNOSIS — E0843 Diabetes mellitus due to underlying condition with diabetic autonomic (poly)neuropathy: Secondary | ICD-10-CM

## 2023-08-12 DIAGNOSIS — M21172 Varus deformity, not elsewhere classified, left ankle: Secondary | ICD-10-CM | POA: Diagnosis not present

## 2023-08-12 DIAGNOSIS — R899 Unspecified abnormal finding in specimens from other organs, systems and tissues: Secondary | ICD-10-CM

## 2023-08-12 NOTE — Telephone Encounter (Signed)
Left message that PA for Repatha was approved.

## 2023-08-12 NOTE — Progress Notes (Unsigned)
Chief Complaint  Patient presents with   Foot Ulcer    Follow up ulcer left   "I wanted to know if this wound is ready for surgery"    Subjective:  62 y.o. female with PMHx of diabetes mellitus PSxHx TMA LT foot presenting for follow-up evaluation of an ulcer that developed to the plantar aspect of the fifth metatarsal left foot.  Patient is doing well.  She is applying the antibiotic cream and a light dressing as instructed  Past Medical History:  Diagnosis Date   Abnormal EKG 10/23/2021   Abnormal mammogram of left breast 09/04/2021   Abnormal stress test 10/23/2021   Acute chest pain 05/08/2016   AICD (automatic cardioverter/defibrillator) present    St Jude/Abbott device   AKI (acute kidney injury) (HCC) 10/23/2021   Angina pectoris (HCC) 09/11/2017   Anxiety 10/23/2021   Bacteremia 11/22/2021   Breast wound, right, subsequent encounter 09/04/2021   Burping 05/08/2016   Cancer (HCC)    cervical - hysterectomy   Cellulitis of right breast 10/23/2021   Cellulitis, wound, post-operative 10/09/2021   Chronic diastolic heart failure (HCC) 11/06/2016   Coronary artery disease involving native coronary artery of native heart with angina pectoris (HCC) 09/12/2015   Overview:  PCI and stent of RCA 2009, last cath 2012 with medical therapy  CABG May 2017   Demand ischemia    Diabetic foot infection (HCC) 11/21/2021   Diabetic foot ulcer (HCC) 02/16/2020   Emphysema lung (HCC) 09/11/2017   patient not aware of this dx   Essential hypertension 09/12/2015   Gangrene (HCC) 10/23/2021   GERD (gastroesophageal reflux disease) 05/08/2016   Hematoma 10/23/2021   Hiatal hernia 10/23/2021   Hyperglycemia 10/23/2021   Hyperglycemia due to type 2 diabetes mellitus (HCC) 10/23/2021   Hyperlipidemia 09/12/2015   Hyponatremia 10/23/2021   ICD (implantable cardioverter-defibrillator) in place 05/16/2021   Infection of great toe 10/23/2021   Lactic acidosis 10/23/2021   Leukocytosis 10/23/2021    Mediastinitis 06/28/2016   Morbid obesity (HCC) 05/08/2016   Myocardial infarction [facility])    NSTEMI (non-ST elevated myocardial infarction) (HCC) 05/22/2020   Obesity (BMI 30-39.9) 10/23/2021   Perineal abscess 10/23/2021   Peripheral vascular disease (HCC)    S/P CABG (coronary artery bypass graft) 06/03/2016   Overview:  The patient underwent sternal reconstruction on 06/21/16 with pec flaps for mediastinitis from a prior CABG in May 2017. On admission, she was critically ill from sepsis and had altered mental status. She was last seen in clinic on 08/09/16 at which time she was doing well.   Severe sepsis (HCC) 06/03/2016   Sinus tachycardia 05/08/2016   Tobacco use disorder 04/20/2016   Overview:  Quit in May 2017.   Type 2 diabetes mellitus with foot ulcer (HCC) 05/08/2016   Unstable angina (HCC) 05/22/2020   Ventricular tachycardia (HCC) 12/17/2020   Wound, surgical, infected 06/06/2016   Overview:  sternal    Past Surgical History:  Procedure Laterality Date   ABDOMINAL AORTOGRAM W/LOWER EXTREMITY N/A 10/11/2021   Procedure: ABDOMINAL AORTOGRAM W/LOWER EXTREMITY;  Surgeon: Cephus Shelling, MD;  Location: [facility] IN[facility];  Service: Cardiovascular;  Laterality: N/A;   AMPUTATION TOE Right 02/20/2020   Procedure: AMPUTATION RIGHT GREAT  TOE;  Surgeon: Candelaria Stagers, DPM;  Location: [facility] OR;  Service: Podiatry;  Laterality: Right;   BLADDER SURGERY     BONE BIOPSY Left 02/01/2022   Procedure: BONE BIOPSY LEFT FOOT, IRRIGATION AND DEBRIDEMENT;  Surgeon: Asencion Islam, DPM;  Location: [facility] OR;  Service: Podiatry;  Laterality: Left;   BUBBLE STUDY  11/27/2021   Procedure: BUBBLE STUDY;  Surgeon: Pricilla Riffle, MD;  Location: [facility] ENDOSCOPY;  Service: Cardiovascular;;   CARDIAC CATHETERIZATION     CORONARY ARTERY BYPASS GRAFT     CORONARY STENT INTERVENTION N/A 05/25/2020   Procedure: CORONARY STENT INTERVENTION;  Surgeon: Iran Ouch, MD;  [facility]: [facility];  Service:  Cardiovascular;  Laterality: N/A;   CORONARY STENT INTERVENTION N/A 06/22/2020   Procedure: CORONARY STENT INTERVENTION;  Surgeon: Marykay Lex, MD;  Location[facility] INVASIVE CV LAB;  Service: Cardiovascular;  Laterality: N/A;   CORONARY ULTRASOUND/IVUS N/A 06/22/2020   Procedure: Intravascular Ultrasound/IVUS;  Surgeon: Marykay Lex, MD;  Location: [facility] INVASIVE CV LAB;  Service: Cardiovascular;  Laterality: N/A;   ICD IMPLANT N/A 12/19/2020   Procedure: ICD IMPLANT;  Surgeon: Marinus Maw, MD;  Location: [facility] INVASIVE CV LAB;  Service: Cardiovascular;  Laterality: N/A;   INCISION AND DRAINAGE Right 02/19/2020   Procedure: INCISION AND DRAINAGE;  Surgeon: Vivi Barrack, DPM;  Location: [facility] OR;  Service: Podiatry;  Laterality: Right;  Block done by surgeon   IRRIGATION AND DEBRIDEMENT FOOT Left 10/10/2021   Procedure: IRRIGATION AND DEBRIDEMENT FOOT;  Surgeon: Asencion Islam, DPM;  Location: [facility];  Service: Podiatry;  Laterality: Left;   IRRIGATION AND DEBRIDEMENT FOOT Left 11/23/2021   Procedure: IRRIGATION AND DEBRIDEMENT FOOT;  Surgeon: Louann Sjogren, MD;  Location: [facility] OR;  Service: Podiatry;  Laterality: Left;  will do local block   LEFT HEART CATH AND CORS/GRAFTS ANGIOGRAPHY N/A 05/23/2020   Procedure: LEFT HEART CATH AND CORS/GRAFTS ANGIOGRAPHY;  Surgeon: Yvonne Kendall, MD;  [facility];  Service: Cardiovascular;  Laterality: N/A;   LEFT HEART CATH AND CORS/GRAFTS ANGIOGRAPHY N/A 12/19/2020   Procedure: LEFT HEART CATH AND CORS/GRAFTS ANGIOGRAPHY;  Surgeon: Kathleene Hazel, MD;  [facility]: [facility];  Service: Cardiovascular;  Laterality: N/A;   LEFT HEART CATH AND CORS/GRAFTS ANGIOGRAPHY N/A 07/21/2021   Procedure: LEFT HEART CATH AND CORS/GRAFTS ANGIOGRAPHY;  Surgeon: Yvonne Kendall, MD;  [facility]: [facility];  Service: Cardiovascular;  Laterality: N/A;   METATARSAL HEAD EXCISION Right 05/02/2020   Procedure: FIRST METATARSAL HEAD RESECTION; RIGHT  FOOT WOUND CLOSURE;  Surgeon: Asencion Islam, DPM;  Location: [facility];  Service: Podiatry;  Laterality: Right;  MAC W/LOCAL   PERIPHERAL VASCULAR BALLOON ANGIOPLASTY Left 10/11/2021   Procedure: PERIPHERAL VASCULAR BALLOON ANGIOPLASTY;  Surgeon: Cephus Shelling, MD;  [facility];  Service: Cardiovascular;  Laterality: Left;  Anterior Tibial Artery   TEE WITHOUT CARDIOVERSION N/A 11/27/2021   Procedure: TRANSESOPHAGEAL ECHOCARDIOGRAM (TEE);  Surgeon: Pricilla Riffle, MD;  Location: [facility] ENDOSCOPY;  Service: Cardiovascular;  Laterality: N/A;   TRANSMETATARSAL AMPUTATION Left 11/23/2021   Procedure: TRANSMETATARSAL AMPUTATION;  Surgeon: Louann Sjogren, MD;  Location: [facility] OR;  Service: Podiatry;  Laterality: Left;   TUBAL LIGATION      Allergies  Allergen Reactions   Vancomycin Anaphylaxis   Chlorhexidine Gluconate Itching    Received CHG bath, began itching, required benadryl   Cat Hair Extract Other (See Comments)    Sneezing, watery eyes.   Tramadol Other (See Comments)    Hallucinations   Codeine Rash    LT foot 10/24/2022  LT foot 10/24/2022   Objective/Physical Exam General: The patient is alert and oriented x3 in no acute distress.  Dermatology:  Wound stable.  It  measures approximately 1.4x0.9 x 0.3 cm.  To the noted ulceration(s), there is no eschar. There is minimal slough, fibrin, and necrotic tissue noted. Granulation tissue and wound base is red.  Negative for any appreciable drainage.  There is no exposed bone muscle-tendon ligament or joint. There is no malodor. Periwound integrity is intact.  Overall the wound appears very stable Skin is warm, dry and supple bilateral lower extremities.  Vascular: Palpable pedal pulses bilaterally. No edema or erythema noted. Capillary refill within normal limits.  Neurological: Epicritic and protective threshold diminished bilaterally.   Musculoskeletal Exam: PSxHx transmetatarsal amputation LT;  healed.  Patient also has a very prominent forefoot varus deformity which increases lateral column loading likely secondary to loss of the peroneus brevis tendon at the fifth metatarsal tubercle at the time of amputation.  This is likely the reason for the ulcer to the lateral aspect of the foot.  The forefoot is very reducible/flexible. Taught tibialis anterior tendon noted  Radiographic exam LT foot 09/26/2022: Clean osteotomy sites.  No cortical erosions, fractures noted.  Radiographically no evidence of osteomyelitis  Assessment: 1.  Ulcer left foot secondary to diabetes mellitus 2. diabetes mellitus w/ peripheral neuropathy 3.  History of transmetatarsal amputation left 4.  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 Patient was evaluated.  X-rays reviewed 2. medically necessary excisional debridement including subcutaneous tissue was performed using a tissue nipper and a chisel blade. Excisional debridement of all the necrotic nonviable tissue down to healthy bleeding viable tissue was performed with post-debridement measurements same as pre-. 3. the wound was cleansed and dry sterile dressing applied. 4.  Continue gentamicin cream daily with a light dressing 5.  After discussing with the patient that unfortunately do not believe she is getting significant improvement with conservative wound care and diabetic insoles.  She does have a very flexible forefoot and I believe a tibialis anterior tendon transfer may be very beneficial for the patient in conjunction with a tendo Achilles lengthening.  This was discussed in detail.  The patient does live in [place] and would prefer to follow-up in the [place] office postoperatively. Dr. Annamary Rummage practices out of our [place] office and recommended him to the patient.  Patient's husband actually sees Dr. Annamary Rummage in the [place] office. 6.  Appointment with with Dr. Annamary Rummage for  possible surgical consultation in the [facility] office.  Dr. Annamary Rummage notified of the patient's condition and recommendation for tendon transfer 7.  Return to clinic as per office for surgical consultation with Dr. Noralee Chars, DPM [facility]  Dr. Felecia Shelling, DPM    [address] [place], [place] [address]                Office [phone]  Fax [phone]

## 2023-08-13 ENCOUNTER — Other Ambulatory Visit: Payer: Self-pay | Admitting: Physician Assistant

## 2023-08-13 LAB — URINE CULTURE

## 2023-08-13 MED ORDER — TOUJEO MAX SOLOSTAR 300 UNIT/ML ~~LOC~~ SOPN: 58.0000 [IU] | PEN_INJECTOR | Freq: Every day | SUBCUTANEOUS | Status: DC

## 2023-08-15 ENCOUNTER — Telehealth: Payer: Self-pay | Admitting: Podiatry

## 2023-08-15 ENCOUNTER — Ambulatory Visit (INDEPENDENT_AMBULATORY_CARE_PROVIDER_SITE_OTHER): Payer: BC Managed Care – PPO

## 2023-08-15 DIAGNOSIS — I472 Ventricular tachycardia, unspecified: Secondary | ICD-10-CM | POA: Diagnosis not present

## 2023-08-15 LAB — CUP PACEART REMOTE DEVICE CHECK
Battery Remaining Longevity: 85 mo
Battery Remaining Percentage: 75 %
Battery Voltage: 2.99 V
Brady Statistic AP VP Percent: 1 %
Brady Statistic AP VS Percent: 1 %
Brady Statistic AS VP Percent: 1 %
Brady Statistic AS VS Percent: 99 %
Brady Statistic RA Percent Paced: 1 %
Brady Statistic RV Percent Paced: 1 %
Date Time Interrogation Session: 20240829095804
HighPow Impedance: 63 Ohm
Implantable Lead Connection Status: 753985
Implantable Lead Connection Status: 753985
Implantable Lead Implant Date: 20220103
Implantable Lead Implant Date: 20220103
Implantable Lead Location: 753859
Implantable Lead Location: 753860
Implantable Pulse Generator Implant Date: 20220103
Lead Channel Impedance Value: 360 Ohm
Lead Channel Impedance Value: 400 Ohm
Lead Channel Pacing Threshold Amplitude: 0.75 V
Lead Channel Pacing Threshold Amplitude: 1 V
Lead Channel Pacing Threshold Pulse Width: 0.5 ms
Lead Channel Pacing Threshold Pulse Width: 0.5 ms
Lead Channel Sensing Intrinsic Amplitude: 12 mV
Lead Channel Sensing Intrinsic Amplitude: 5 mV
Lead Channel Setting Pacing Amplitude: 2 V
Lead Channel Setting Pacing Amplitude: 2.5 V
Lead Channel Setting Pacing Pulse Width: 0.5 ms
Lead Channel Setting Sensing Sensitivity: 0.5 mV
Pulse Gen Serial Number: 111037319

## 2023-08-15 NOTE — Telephone Encounter (Signed)
Patient said Dr Logan Bores was referring her for MRI of her foot.  She got a call from [facility] noting they cannot do her MRI there because she has a defibrillator.  They told her she'd have to go the the main hospital for it (and FYI, last time I had to schedule one for a patient with a pacemaker, they were booked out 4 months for this).    She is wondering what she needs to do now.  Best callback [phone]

## 2023-08-21 ENCOUNTER — Other Ambulatory Visit: Payer: BC Managed Care – PPO

## 2023-08-21 ENCOUNTER — Telehealth: Payer: Self-pay | Admitting: Physician Assistant

## 2023-08-21 ENCOUNTER — Other Ambulatory Visit: Payer: Self-pay | Admitting: Podiatry

## 2023-08-21 ENCOUNTER — Other Ambulatory Visit: Payer: Self-pay

## 2023-08-21 DIAGNOSIS — R928 Other abnormal and inconclusive findings on diagnostic imaging of breast: Secondary | ICD-10-CM

## 2023-08-21 DIAGNOSIS — M86672 Other chronic osteomyelitis, left ankle and foot: Secondary | ICD-10-CM

## 2023-08-21 DIAGNOSIS — R899 Unspecified abnormal finding in specimens from other organs, systems and tissues: Secondary | ICD-10-CM

## 2023-08-21 DIAGNOSIS — L97522 Non-pressure chronic ulcer of other part of left foot with fat layer exposed: Secondary | ICD-10-CM

## 2023-08-21 LAB — COMPREHENSIVE METABOLIC PANEL
ALT: 14 IU/L (ref 0–32)
AST: 14 IU/L (ref 0–40)
Albumin: 4 g/dL (ref 3.9–4.9)
Alkaline Phosphatase: 60 IU/L (ref 44–121)
BUN/Creatinine Ratio: 20 (ref 12–28)
BUN: 18 mg/dL (ref 8–27)
Bilirubin Total: 0.2 mg/dL (ref 0.0–1.2)
CO2: 24 mmol/L (ref 20–29)
Calcium: 9.6 mg/dL (ref 8.7–10.3)
Chloride: 106 mmol/L (ref 96–106)
Creatinine, Ser: 0.88 mg/dL (ref 0.57–1.00)
Globulin, Total: 2.8 g/dL (ref 1.5–4.5)
Glucose: 116 mg/dL — ABNORMAL HIGH (ref 70–99)
Potassium: 5.1 mmol/L (ref 3.5–5.2)
Sodium: 143 mmol/L (ref 134–144)
Total Protein: 6.8 g/dL (ref 6.0–8.5)
eGFR: 74 mL/min/{1.73_m2} (ref >59)

## 2023-08-21 NOTE — Telephone Encounter (Signed)
   Jasmine Richardson has been scheduled for the following appointment:  WHAT: DIAGNOSTIC MAMMOGRAM WHERE: [place] OUTPATIENT DATE: 09/09/23 TIME: 3:10 PM CHECK-IN  A message has been left for the patient.

## 2023-08-21 NOTE — Progress Notes (Signed)
Patient has pacemaker. MRI LT foot wo contrast ordered at [facility].   Felecia Shelling, DPM [facility]  Dr. Felecia Shelling, DPM    [address] [place], [place] [address]                Office [phone]  Fax [phone]

## 2023-08-22 ENCOUNTER — Other Ambulatory Visit: Payer: Self-pay | Admitting: Family Medicine

## 2023-08-22 DIAGNOSIS — E0865 Diabetes mellitus due to underlying condition with hyperglycemia: Secondary | ICD-10-CM

## 2023-08-23 NOTE — Progress Notes (Signed)
Remote ICD transmission.   

## 2023-08-29 ENCOUNTER — Ambulatory Visit (HOSPITAL_COMMUNITY)
Admission: RE | Admit: 2023-08-29 | Discharge: 2023-08-29 | Disposition: A | Discharge status: Home / Self Care | Payer: BC Managed Care – PPO | Source: Ambulatory Visit | Attending: Podiatry | Admitting: Podiatry

## 2023-08-29 DIAGNOSIS — L97522 Non-pressure chronic ulcer of other part of left foot with fat layer exposed: Secondary | ICD-10-CM | POA: Diagnosis not present

## 2023-08-29 DIAGNOSIS — M19072 Primary osteoarthritis, left ankle and foot: Secondary | ICD-10-CM | POA: Diagnosis not present

## 2023-08-29 DIAGNOSIS — M86672 Other chronic osteomyelitis, left ankle and foot: Secondary | ICD-10-CM | POA: Insufficient documentation

## 2023-08-29 DIAGNOSIS — M7989 Other specified soft tissue disorders: Secondary | ICD-10-CM | POA: Diagnosis not present

## 2023-09-03 DIAGNOSIS — L89893 Pressure ulcer of other site, stage 3: Secondary | ICD-10-CM | POA: Diagnosis not present

## 2023-09-04 ENCOUNTER — Telehealth: Payer: Self-pay | Admitting: Podiatry

## 2023-09-04 NOTE — Telephone Encounter (Signed)
Pt called about MRI test results.

## 2023-09-09 DIAGNOSIS — Z950 Presence of cardiac pacemaker: Secondary | ICD-10-CM | POA: Diagnosis not present

## 2023-09-09 DIAGNOSIS — R928 Other abnormal and inconclusive findings on diagnostic imaging of breast: Secondary | ICD-10-CM | POA: Diagnosis not present

## 2023-09-09 DIAGNOSIS — R92323 Mammographic fibroglandular density, bilateral breasts: Secondary | ICD-10-CM | POA: Diagnosis not present

## 2023-09-09 DIAGNOSIS — R920 Mammographic microcalcification found on diagnostic imaging of breast: Secondary | ICD-10-CM | POA: Diagnosis not present

## 2023-09-09 LAB — HM MAMMOGRAPHY

## 2023-09-10 ENCOUNTER — Encounter: Payer: Self-pay | Admitting: Physician Assistant

## 2023-09-16 NOTE — Telephone Encounter (Signed)
Patient needs an appointment to come in to review the MRI results and talk about treatment options.  Thanks, Dr. Logan Bores

## 2023-09-17 ENCOUNTER — Encounter: Payer: Self-pay | Admitting: Podiatry

## 2023-09-17 DIAGNOSIS — L89893 Pressure ulcer of other site, stage 3: Secondary | ICD-10-CM | POA: Diagnosis not present

## 2023-09-17 DIAGNOSIS — L97522 Non-pressure chronic ulcer of other part of left foot with fat layer exposed: Secondary | ICD-10-CM | POA: Diagnosis not present

## 2023-09-19 ENCOUNTER — Other Ambulatory Visit: Payer: Self-pay | Admitting: Physician Assistant

## 2023-09-23 ENCOUNTER — Ambulatory Visit: Payer: BC Managed Care – PPO | Admitting: Podiatry

## 2023-09-23 ENCOUNTER — Encounter: Payer: Self-pay | Admitting: Podiatry

## 2023-09-23 DIAGNOSIS — L97522 Non-pressure chronic ulcer of other part of left foot with fat layer exposed: Secondary | ICD-10-CM | POA: Diagnosis not present

## 2023-09-23 DIAGNOSIS — M86672 Other chronic osteomyelitis, left ankle and foot: Secondary | ICD-10-CM

## 2023-09-23 NOTE — Progress Notes (Signed)
Chief Complaint  Patient presents with   Foot Ulcer    Follow up ulcer left   "I think it looks really good and I think he will be surprised at how it looks"    Subjective:  62 y.o. female with PMHx of diabetes mellitus PSxHx TMA LT foot presenting for follow-up evaluation of an ulcer that developed to the plantar aspect of the fifth metatarsal left foot.  Patient has been going to the wound care center weekly at [facility].  Overall improvement.  MRI was ordered last visit and she presents today to review the MRI results and discuss further treatment options  Past Medical History:  Diagnosis Date   Abnormal EKG 10/23/2021   Abnormal mammogram of left breast 09/04/2021   Abnormal stress test 10/23/2021   Acute chest pain 05/08/2016   AICD (automatic cardioverter/defibrillator) present    St Jude/Abbott device   AKI (acute kidney injury) (HCC) 10/23/2021   Angina pectoris (HCC) 09/11/2017   Anxiety 10/23/2021   Bacteremia 11/22/2021   Breast wound, right, subsequent encounter 09/04/2021   Burping 05/08/2016   Cancer (HCC)    cervical - hysterectomy   Cellulitis of right breast 10/23/2021   Cellulitis, wound, post-operative 10/09/2021   Chronic diastolic heart failure (HCC) 11/06/2016   Coronary artery disease involving native coronary artery of native heart with angina pectoris (HCC) 09/12/2015   Overview:  PCI and stent of RCA 2009, last cath 2012 with medical therapy  CABG May 2017   Demand ischemia [facility])    Diabetic foot infection (HCC) 11/21/2021   Diabetic foot ulcer (HCC) 02/16/2020   Emphysema lung (HCC) 09/11/2017   patient not aware of this dx   Essential hypertension 09/12/2015   Gangrene (HCC) 10/23/2021   GERD (gastroesophageal reflux disease) 05/08/2016   Hematoma 10/23/2021   Hiatal hernia 10/23/2021   Hyperglycemia 10/23/2021   Hyperglycemia due to type 2 diabetes mellitus (HCC) 10/23/2021   Hyperlipidemia 09/12/2015   Hyponatremia 10/23/2021   ICD (implantable  cardioverter-defibrillator) in place 05/16/2021   Infection of great toe 10/23/2021   Lactic acidosis 10/23/2021   Leukocytosis 10/23/2021   Mediastinitis 06/28/2016   Morbid obesity (HCC) 05/08/2016   Myocardial infarction [facility])    NSTEMI (non-ST elevated myocardial infarction) (HCC) 05/22/2020   Obesity (BMI 30-39.9) 10/23/2021   Perineal abscess 10/23/2021   Peripheral vascular disease (HCC)    S/P CABG (coronary artery bypass graft) 06/03/2016   Overview:  The patient underwent sternal reconstruction on 06/21/16 with pec flaps for mediastinitis from a prior CABG in May 2017. On admission, she was critically ill from sepsis and had altered mental status. She was last seen in clinic on 08/09/16 at which time she was doing well.   Severe sepsis (HCC) 06/03/2016   Sinus tachycardia 05/08/2016   Tobacco use disorder 04/20/2016   Overview:  Quit in May 2017.   Type 2 diabetes mellitus with foot ulcer (HCC) 05/08/2016   Unstable angina (HCC) 05/22/2020   Ventricular tachycardia (HCC) 12/17/2020   Wound, surgical, infected 06/06/2016   Overview:  sternal    Past Surgical History:  Procedure Laterality Date   ABDOMINAL AORTOGRAM W/LOWER EXTREMITY N/A 10/11/2021   Procedure: ABDOMINAL AORTOGRAM W/LOWER EXTREMITY;  Surgeon: Cephus Shelling, MD;  Location: [facility] INVASIVE CV LAB;  Service: Cardiovascular;  Laterality: N/A;   AMPUTATION TOE Right 02/20/2020   Procedure: AMPUTATION RIGHT GREAT  TOE;  Surgeon: Candelaria Stagers, DPM;  Location: [facility] OR;  Service: Podiatry;  Laterality: Right;  BLADDER SURGERY     BONE BIOPSY Left 02/01/2022   Procedure: BONE BIOPSY LEFT FOOT, IRRIGATION AND DEBRIDEMENT;  Surgeon: Asencion Islam, DPM;  Location: [facility] OR;  Service: Podiatry;  Laterality: Left;   BUBBLE STUDY  11/27/2021   Procedure: BUBBLE STUDY;  Surgeon: Pricilla Riffle, MD;  Location: [facility] ENDOSCOPY;  Service: Cardiovascular;;   CARDIAC CATHETERIZATION     CORONARY ARTERY BYPASS GRAFT     CORONARY STENT INTERVENTION  N/A 05/25/2020   Procedure: CORONARY STENT INTERVENTION;  Surgeon: Iran Ouch, MD;  Location: [facility];  Service: Cardiovascular;  Laterality: N/A;   CORONARY STENT INTERVENTION N/A 06/22/2020   Procedure: CORONARY STENT INTERVENTION;  Surgeon: Marykay Lex, MD;  Location: [facility] INVASIVE CV LAB;  Service: Cardiovascular;  Laterality: N/A;   CORONARY ULTRASOUND/IVUS N/A 06/22/2020   Procedure: Intravascular Ultrasound/IVUS;  Surgeon: Marykay Lex, MD;  Location: [facility] INVASIVE CV LAB;  Service: Cardiovascular;  Laterality: N/A;   ICD IMPLANT N/A 12/19/2020   Procedure: ICD IMPLANT;  Surgeon: Marinus Maw, MD;  Location: [facility] INVASIVE CV LAB;  Service: Cardiovascular;  Laterality: N/A;   INCISION AND DRAINAGE Right 02/19/2020   Procedure: INCISION AND DRAINAGE;  Surgeon: Vivi Barrack, DPM;  Location: [facility] OR;  Service: Podiatry;  Laterality: Right;  Block done by surgeon   IRRIGATION AND DEBRIDEMENT FOOT Left 10/10/2021   Procedure: IRRIGATION AND DEBRIDEMENT FOOT;  Surgeon: Asencion Islam, DPM;  Location: [facility] OR;  Service: Podiatry;  Laterality: Left;   IRRIGATION AND DEBRIDEMENT FOOT Left 11/23/2021   Procedure: IRRIGATION AND DEBRIDEMENT FOOT;  Surgeon: Louann Sjogren, MD;  Location: [facility] OR;  Service: Podiatry;  Laterality: Left;  will do local block   LEFT HEART CATH AND CORS/GRAFTS ANGIOGRAPHY N/A 05/23/2020   Procedure: LEFT HEART CATH AND CORS/GRAFTS ANGIOGRAPHY;  Surgeon: Yvonne Kendall, MD;  [facility]: [facility];  Service: Cardiovascular;  Laterality: N/A;   LEFT HEART CATH AND CORS/GRAFTS ANGIOGRAPHY N/A 12/19/2020   Procedure: LEFT HEART CATH AND CORS/GRAFTS ANGIOGRAPHY;  Surgeon: Kathleene Hazel, MD;  [facility]: [facility];  Service: Cardiovascular;  Laterality: N/A;   LEFT HEART CATH AND CORS/GRAFTS ANGIOGRAPHY N/A 07/21/2021   Procedure: LEFT HEART CATH AND CORS/GRAFTS ANGIOGRAPHY;  Surgeon: Yvonne Kendall, MD;  [facility]: [facility];  Service:  Cardiovascular;  Laterality: N/A;   METATARSAL HEAD EXCISION Right 05/02/2020   Procedure: FIRST METATARSAL HEAD RESECTION; RIGHT FOOT WOUND CLOSURE;  Surgeon: Asencion Islam, DPM;  Location: [facility];  Service: Podiatry;  Laterality: Right;  MAC W/LOCAL   PERIPHERAL VASCULAR BALLOON ANGIOPLASTY Left 10/11/2021   Procedure: PERIPHERAL VASCULAR BALLOON ANGIOPLASTY;  Surgeon: Cephus Shelling, MD;  [facility]: [facility];  Service: Cardiovascular;  Laterality: Left;  Anterior Tibial Artery   TEE WITHOUT CARDIOVERSION N/A 11/27/2021   Procedure: TRANSESOPHAGEAL ECHOCARDIOGRAM (TEE);  Surgeon: Pricilla Riffle, MD;  Location: [facility] ENDOSCOPY;  Service: Cardiovascular;  Laterality: N/A;   TRANSMETATARSAL AMPUTATION Left 11/23/2021   Procedure: TRANSMETATARSAL AMPUTATION;  Surgeon: Louann Sjogren, MD;  Location: [facility] OR;  Service: Podiatry;  Laterality: Left;   TUBAL LIGATION      Allergies  Allergen Reactions   Vancomycin Anaphylaxis   Chlorhexidine Gluconate Itching    Received CHG bath, began itching, required benadryl   Cat Hair Extract Other (See Comments)    Sneezing, watery eyes.   Tramadol Other (See Comments)    Hallucinations   Codeine Rash    LT foot 10/24/2022  LT foot 10/24/2022   LT foot 08/13/2023   LT foot 09/23/2023   Objective/Physical Exam General: The patient is alert and oriented x3 in no acute distress.  Dermatology:  Improvement noted.  It measures approximately 1.0 x 1.0 x 0.3 cm.  To the noted ulceration(s), there is no eschar. There is minimal slough, fibrin, and necrotic tissue noted. Granulation tissue and wound base is red.  Negative for any appreciable drainage.  There is no exposed bone muscle-tendon ligament or joint. There is no malodor. Periwound integrity is intact.  Overall the wound appears very stable. Skin is warm, dry and supple bilateral lower extremities.  Vascular: Known history of peripheral vascular disease.  No edema or  erythema noted.   VAS [place] ABI WITH/WO TBI 05/08/2022 ABI Findings:  +--------+------------------+-----+--------+--------+  Right  Rt Pressure (mmHg)IndexWaveformComment   +--------+------------------+-----+--------+--------+  [id number]                                     +--------+------------------+-----+--------+--------+  PTA    125               0.82 biphasic          +--------+------------------+-----+--------+--------+  DP     145               0.95 biphasic          +--------+------------------+-----+--------+--------+   +--------+------------------+-----+--------+-------+  Left   Lt Pressure (mmHg)IndexWaveformComment  +--------+------------------+-----+--------+-------+  [id number]                                    +--------+------------------+-----+--------+-------+  ATA                           absent           +--------+------------------+-----+--------+-------+  PTA    0                 0.00 absent           +--------+------------------+-----+--------+-------+  DP     180               1.18 biphasic         +--------+------------------+-----+--------+-------+   +-------+-----------+-----------+------------+------------+  ABI/TBIToday's ABIToday's TBIPrevious ABIPrevious TBI  +-------+-----------+-----------+------------+------------+  Right 0.95       Amputation 0.80        Amputation    +-------+-----------+-----------+------------+------------+  Left  1.18       Amputation 1.07        Amputation    +-------+-----------+-----------+------------+------------+  Right ABIs appear increased compared to prior study on 11/14/2021.  Compared to prior study on 11/14/2021.    Summary:  Right: Resting right ankle-brachial index is within normal range. No  evidence of significant right lower extremity arterial disease.   Left: Resting left ankle-brachial index is within normal range. No  evidence of  significant left lower extremity arterial disease.   Neurological: Light touch and protective threshold diminished bilaterally.   Musculoskeletal Exam: Unchanged.  PSxHx transmetatarsal amputation LT; healed.  Patient also has a very prominent forefoot varus deformity which increases lateral column loading likely secondary to loss of the peroneus brevis tendon at the fifth metatarsal tubercle at the time of amputation.  This is likely the reason for the ulcer to the lateral aspect of the foot.  The forefoot is very reducible/flexible. Taught tibialis anterior tendon noted  Radiographic exam LT foot 09/26/2022: Clean osteotomy sites.  No cortical erosions, fractures noted.  Radiographically no evidence of osteomyelitis  Radiographic exam LT foot 08/12/2023: Osseous irregularity noted to the remaining portion of the fifth metatarsal of the left foot.  Consistent with possible osteomyelitis versus stress reaction secondary to weightbearing.  No obvious fractures identified.  MR FOOT LEFT WO CONTRAST 08/29/2023 FINDINGS: Bones/Joint/Cartilage No fracture or dislocation. Normal alignment. No joint effusion. Prior transmetatarsal amputation. Bone marrow edema in the residual fifth metatarsal base concerning for osteomyelitis. Soft tissue wound overlying the base of the fifth metatarsal. Moderate osteoarthritis of the fourth TMT joint. Mild osteoarthritis of the fifth TMT joint. Mild osteoarthritis of the second and third TMT joints. IMPRESSION: 1. Prior transmetatarsal amputation. Bone marrow edema in the residual fifth metatarsal base concerning for osteomyelitis. Soft tissue wound overlying the base of the fifth metatarsal.  Assessment: 1.  Ulcer left foot secondary to diabetes mellitus 2. diabetes mellitus w/ peripheral neuropathy 3.  History of transmetatarsal amputation left 4.  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  New finding of  osseous irregularity to the fifth metatarsal of the left foot.  Stress reaction VS.  Osteomyelitis   Plan of Care:  -Patient was evaluated.  MRI reviewed -Based on MRI findings I do believe it is appropriate to proceed with a bone biopsy to ensure whether or not the edema is related to pressure versus true osteomyelitis.  This was discussed in detail with the patient.  And she agrees and consents for surgery -Authorization for surgery was initiated.  Surgery will consist of bone biopsy fifth metatarsal left.  Excisional debridement of ulcer left -Return to clinic 1 week postop  Felecia Shelling, DPM [facility]  Dr. Felecia Shelling, DPM    [address] [place], [place] [address]                Office [phone]  Fax [phone]

## 2023-10-02 ENCOUNTER — Telehealth: Payer: Self-pay | Admitting: Podiatry

## 2023-10-02 NOTE — Telephone Encounter (Signed)
DOS- 10/10/2023  BONE BIOPSY LT-20220 WOUND DEBRIDEMENT LT-11043  [employer] EFFECTIVE DATE- 07/11/2023  DEDUCTIBLE- $1500.00 WITH REMAINING $0.00 OOP- $4000.00 WITH REMAINING $1326.86 COINSURANCE-15%  SPOKE WITH CHELSEA W. FROM [employer] AND SHE STATED THAT PRIOR AUTH IS NOT REQUIRED FOR CPT CODES 41324 AND 11043.  CALL REF NUMBER: CHELSEA W. 10/02/2023 @4 :10 PM EST

## 2023-10-09 ENCOUNTER — Encounter (HOSPITAL_COMMUNITY): Payer: Self-pay

## 2023-10-09 ENCOUNTER — Other Ambulatory Visit: Payer: Self-pay

## 2023-10-09 ENCOUNTER — Encounter (HOSPITAL_COMMUNITY): Payer: Self-pay | Admitting: Physician Assistant

## 2023-10-09 ENCOUNTER — Encounter (HOSPITAL_COMMUNITY)
Admission: RE | Admit: 2023-10-09 | Discharge: 2023-10-09 | Disposition: A | Discharge status: Home / Self Care | Payer: BC Managed Care – PPO | Source: Ambulatory Visit | Attending: Podiatry | Admitting: Podiatry

## 2023-10-09 VITALS — BP 153/75 | HR 72 | Temp 98.2°F | Resp 16 | Ht 67.0 in | Wt 220.0 lb

## 2023-10-09 DIAGNOSIS — I2510 Atherosclerotic heart disease of native coronary artery without angina pectoris: Secondary | ICD-10-CM | POA: Insufficient documentation

## 2023-10-09 DIAGNOSIS — Z794 Long term (current) use of insulin: Secondary | ICD-10-CM | POA: Insufficient documentation

## 2023-10-09 DIAGNOSIS — Z01812 Encounter for preprocedural laboratory examination: Secondary | ICD-10-CM | POA: Insufficient documentation

## 2023-10-09 DIAGNOSIS — Z95810 Presence of automatic (implantable) cardiac defibrillator: Secondary | ICD-10-CM | POA: Insufficient documentation

## 2023-10-09 DIAGNOSIS — I5032 Chronic diastolic (congestive) heart failure: Secondary | ICD-10-CM | POA: Diagnosis not present

## 2023-10-09 DIAGNOSIS — L97522 Non-pressure chronic ulcer of other part of left foot with fat layer exposed: Secondary | ICD-10-CM | POA: Diagnosis not present

## 2023-10-09 DIAGNOSIS — Z951 Presence of aortocoronary bypass graft: Secondary | ICD-10-CM | POA: Diagnosis not present

## 2023-10-09 DIAGNOSIS — E0865 Diabetes mellitus due to underlying condition with hyperglycemia: Secondary | ICD-10-CM

## 2023-10-09 DIAGNOSIS — I10 Essential (primary) hypertension: Secondary | ICD-10-CM

## 2023-10-09 DIAGNOSIS — E11621 Type 2 diabetes mellitus with foot ulcer: Secondary | ICD-10-CM | POA: Insufficient documentation

## 2023-10-09 DIAGNOSIS — Z01818 Encounter for other preprocedural examination: Secondary | ICD-10-CM

## 2023-10-09 DIAGNOSIS — Z87891 Personal history of nicotine dependence: Secondary | ICD-10-CM | POA: Insufficient documentation

## 2023-10-09 DIAGNOSIS — I110 Hypertensive heart disease with heart failure: Secondary | ICD-10-CM | POA: Insufficient documentation

## 2023-10-09 LAB — CBC
HCT: 39.6 % (ref 36.0–46.0)
Hemoglobin: 12.9 g/dL (ref 12.0–15.0)
MCH: 29.3 pg (ref 26.0–34.0)
MCHC: 32.6 g/dL (ref 30.0–36.0)
MCV: 89.8 fL (ref 80.0–100.0)
Platelets: 256 10*3/uL (ref 150–400)
RBC: 4.41 MIL/uL (ref 3.87–5.11)
RDW: 13.7 % (ref 11.5–15.5)
WBC: 8.7 10*3/uL (ref 4.0–10.5)
nRBC: 0 % (ref 0.0–0.2)

## 2023-10-09 LAB — BASIC METABOLIC PANEL
Anion gap: 8 (ref 5–15)
BUN: 23 mg/dL (ref 8–23)
CO2: 24 mmol/L (ref 22–32)
Calcium: 9.3 mg/dL (ref 8.9–10.3)
Chloride: 108 mmol/L (ref 98–111)
Creatinine, Ser: 0.87 mg/dL (ref 0.44–1.00)
GFR, Estimated: >60 mL/min (ref >60)
Glucose, Bld: 276 mg/dL — ABNORMAL HIGH (ref 70–99)
Potassium: 4.6 mmol/L (ref 3.5–5.1)
Sodium: 140 mmol/L (ref 135–145)

## 2023-10-09 LAB — HEMOGLOBIN A1C
Hgb A1c MFr Bld: 8.5 % — ABNORMAL HIGH (ref 4.8–5.6)
Mean Plasma Glucose: 197.25 mg/dL

## 2023-10-09 LAB — SURGICAL PCR SCREEN
MRSA, PCR: NEGATIVE
Staphylococcus aureus: NEGATIVE

## 2023-10-09 LAB — GLUCOSE, CAPILLARY: Glucose-Capillary: 300 mg/dL — ABNORMAL HIGH (ref 70–99)

## 2023-10-09 NOTE — Patient Instructions (Addendum)
SURGICAL WAITING ROOM VISITATION  Patients having surgery or a procedure may have no more than 2 support people in the waiting area - these visitors may rotate.    Children under the age of 44 must have an adult with them who is not the patient.  If the patient needs to stay at the hospital during part of their recovery, the visitor guidelines for inpatient rooms apply. Pre-op nurse will coordinate an appropriate time for 1 support person to accompany patient in pre-op.  This support person may not rotate.    Please refer to the [facility] website for the visitor guidelines for Inpatients (after your surgery is over and you are in a regular room).       Your procedure is scheduled on: 10-10-23   Report to [facility] Main Entrance    Report to admitting at        1145   AM   Call this number if you have problems the morning of surgery [phone]   Do not eat food :After Midnight.   After Midnight you may have the following liquids until _0800_____ AM/    DAY OF SURGERY   THEN NOTHING BY MOUTH  Water Non-Citrus Juices (without pulp, NO RED-Apple, White grape, White cranberry) Black Coffee (NO MILK/CREAM OR CREAMERS, sugar ok)  Clear Tea (NO MILK/CREAM OR CREAMERS, sugar ok) regular and decaf                             Plain Jell-O (NO RED)                                           Fruit ices (not with fruit pulp, NO RED)                                     Popsicles (NO RED)                                                               Sports drinks like Gatorade (NO RED)                    FOLLOW  ANY ADDITIONAL PRE OP INSTRUCTIONS YOU RECEIVED FROM YOUR SURGEON'S OFFICE!!!     Oral Hygiene is also important to reduce your risk of infection.                                    Remember - BRUSH YOUR TEETH THE MORNING OF SURGERY WITH YOUR REGULAR TOOTHPASTE  DENTURES WILL BE REMOVED PRIOR TO SURGERY PLEASE DO NOT APPLY "Poly grip" OR ADHESIVES!!!   Do NOT smoke  after Midnight   Stop all vitamins and herbal supplements 7 days before surgery.   Take these medicines the morning of surgery with A SIP OF WATER: rosuvastatin, omeprazole, metoprolol, Ativan if needed, amiodarone   How to Manage Your Diabetes Before and After Surgery  Why is it important to control my blood sugar  before and after surgery? Improving blood sugar levels before and after surgery helps healing and can limit problems. A way of improving blood sugar control is eating a healthy diet by:  Eating less sugar and carbohydrates  Increasing activity/exercise  Talking with your doctor about reaching your blood sugar goals High blood sugars (greater than 180 mg/dL) can raise your risk of infections and slow your recovery, so you will need to focus on controlling your diabetes during the weeks before surgery. Make sure that the doctor who takes care of your diabetes knows about your planned surgery including the date and location.  How do I manage my blood sugar before surgery? Check your blood sugar at least 4 times a day, starting 2 days before surgery, to make sure that the level is not too high or low. Check your blood sugar the morning of your surgery when you wake up and every 2 hours until you get to the Short Stay unit. If your blood sugar is less than 70 mg/dL, you will need to treat for low blood sugar: Do not take insulin. Treat a low blood sugar (less than 70 mg/dL) with  cup of clear juice (cranberry or apple), 4 glucose tablets, OR glucose gel. Recheck blood sugar in 15 minutes after treatment (to make sure it is greater than 70 mg/dL). If your blood sugar is not greater than 70 mg/dL on recheck, call [phone] for further instructions. Report your blood sugar to the short stay nurse when you get to Short Stay.  If you are admitted to the hospital after surgery: Your blood sugar will be checked by the staff and you will probably be given insulin after surgery (instead  of oral diabetes medicines) to make sure you have good blood sugar levels. The goal for blood sugar control after surgery is 80-180 mg/dL.   WHAT DO I DO ABOUT MY DIABETES MEDICATION?  Do not take oral diabetes medicines (pills) the morning of surgery.  TOUJEO DAY BEFORE SURGERY TAKE YOUR NORMAL DOSE IF YOU TAKE IT IN THE MORNING. IF YOU TAKE IT, DINNER/BEDTIME ONLY TAKE 50 % OF YOUR DOSE.                FARXIGA- HOLD 72 HOURS  LAST DOSE   10-06-23  THE MORNING OF SURGERY, TAKE 50 % OF YOUR NORMAL TOUJEO DOSE IF YOU TAKE IT IN THE AM  DO NOT TAKE THE FOLLOWING 7 DAYS PRIOR TO SURGERY: Ozempic, Wegovy, Rybelsus (Semaglutide), Byetta (exenatide), Bydureon (exenatide ER), Victoza, Saxenda (liraglutide), or Trulicity (dulaglutide) Mounjaro (Tirzepatide) Adlyxin (Lixisenatide), Polyethylene Glycol Loxenatide.  If your CBG is greater than 220 mg/dL, you may take  of your sliding scale  (correction) dose of insulin.   DO NOT TAKE ANY ORAL DIABETIC MEDICATIONS DAY OF YOUR SURGERY  Bring CPAP mask and tubing day of surgery.                              You may not have any metal on your body including hair pins, jewelry, and body piercing             Do not wear make-up, lotions, powders, perfumes/cologne, or deodorant  Do not wear nail polish including gel and S&S, artificial/acrylic nails, or any other type of covering on natural nails including finger and toenails. If you have artificial nails, gel coating, etc. that needs to be removed by a nail salon please have this removed prior  to surgery or surgery may need to be canceled/ delayed if the surgeon/ anesthesia feels like they are unable to be safely monitored.   Do not shave  48 hours prior to surgery.             Do not bring valuables to the hospital. [facility] IS NOT             RESPONSIBLE   FOR VALUABLES.   Contacts, glasses, dentures or bridgework may not be worn into surgery.   Bring small overnight bag day of surgery.    DO NOT BRING YOUR HOME MEDICATIONS TO THE HOSPITAL. PHARMACY WILL DISPENSE MEDICATIONS LISTED ON YOUR MEDICATION LIST TO YOU DURING YOUR ADMISSION IN THE HOSPITAL!    Patients discharged on the day of surgery will not be allowed to drive home.  Someone NEEDS to stay with you for the first 24 hours after anesthesia.   Special Instructions: Bring a copy of your healthcare power of attorney and living will documents the day of surgery if you haven't scanned them before.              Please read over the following fact sheets you were given: IF YOU HAVE QUESTIONS ABOUT YOUR PRE-OP INSTRUCTIONS PLEASE CALL [phone]    If you test positive for Covid or have been in contact with anyone that has tested positive in the last 10 days please notify you surgeon.    [facility] - Preparing for Surgery Before surgery, you can play an important role.  Because skin is not sterile, your skin needs to be as free of germs as possible.  You can reduce the number of germs on your skin by washing with CHG (chlorahexidine gluconate) soap before surgery.  CHG is an antiseptic cleaner which kills germs and bonds with the skin to continue killing germs even after washing. Please DO NOT use if you have an allergy to CHG or antibacterial soaps.  If your skin becomes reddened/irritated stop using the CHG and inform your nurse when you arrive at Short Stay. Do not shave (including legs and underarms) for at least 48 hours prior to the first CHG shower.  You may shave your face/neck. Please follow these instructions carefully:  1.  Shower with CHG Soap the night before surgery and the  morning of Surgery.  2.  If you choose to wash your hair, wash your hair first as usual with your  normal  shampoo.  3.  After you shampoo, rinse your hair and body thoroughly to remove the  shampoo.                           4.  Use CHG as you would any other liquid soap.  You can apply chg directly  to the skin and wash                        Gently with a scrungie or clean washcloth.  5.  Apply the CHG Soap to your body ONLY FROM THE NECK DOWN.   Do not use on face/ open                           Wound or open sores. Avoid contact with eyes, ears mouth and genitals (private parts).  Wash face,  Genitals (private parts) with your normal soap.             6.  Wash thoroughly, paying special attention to the area where your surgery  will be performed.  7.  Thoroughly rinse your body with warm water from the neck down.  8.  DO NOT shower/wash with your normal soap after using and rinsing off  the CHG Soap.                9.  Pat yourself dry with a clean towel.            10.  Wear clean pajamas.            11.  Place clean sheets on your bed the night of your first shower and do not  sleep with pets. Day of Surgery : Do not apply any lotions/deodorants the morning of surgery.  Please wear clean clothes to the hospital/surgery center.  FAILURE TO FOLLOW THESE INSTRUCTIONS MAY RESULT IN THE CANCELLATION OF YOUR SURGERY PATIENT SIGNATURE_________________________________  NURSE SIGNATURE__________________________________  ________________________________________________________________________

## 2023-10-09 NOTE — Progress Notes (Signed)
Anesthesia Chart Review   Case: [id number] Date/Time: 10/10/23 1145   Procedures:      BONE BIOPSY (Left)     IRRIGATION AND DEBRIDEMENT WOUND (Left)   Anesthesia type: Choice   Pre-op diagnosis: ulcer of left foot with fat layer exposed   Location: [facility] ROOM 06 / [facility]   Surgeons: Felecia Shelling, DPM       DISCUSSION:62 y.o. former smoker with h/o HTN, CAD s/p CABG 2017, CHF, AICD in place, PVD, DM II (A1C 7.8), ulcer of left foot scheduled for above procedure 10/10/23 with Gala Lewandowsky, DPM.   Pt last seen by cardiology 02/27/2023. Per OV note heart failure well compensated, no cv sx, no change in therapy.  Echo ordered at this visit.   Echo 03/12/2023 with EF 60-65%, no valvular problems.   Last device check 08/15/23. Device orders requested.   Pt reports her last dose of Plavix was AM of 10/08/23.   H&P requested.  VS: BP (!) 153/75   Pulse 72   Temp 36.8 C (Oral)   Resp 16   Ht 5\' 7"  (1.702 m)   Wt 99.8 kg   LMP  (LMP Unknown)   SpO2 98%   BMI 34.46 kg/m   PROVIDERS: Marianne Sofia, PA-C is PCP   Norman Herrlich, MD is Cardiologist  LABS: Labs reviewed: Acceptable for surgery. (all labs ordered are listed, but only abnormal results are displayed)  Labs Reviewed  GLUCOSE, CAPILLARY - Abnormal; Notable for the following components:      Result Value   Glucose-Capillary 300 (*)    All other components within normal limits  SURGICAL PCR SCREEN  CBC  BASIC METABOLIC PANEL  HEMOGLOBIN A1C     IMAGES:   EKG:   CV: Echo 03/12/23 1. Left ventricular ejection fraction, by estimation, is 60 to 65%. The  left ventricle has normal function. The left ventricle has no regional  wall motion abnormalities. There is mild left ventricular hypertrophy.  Left ventricular diastolic function  could not be evaluated.   2. Right ventricular systolic function is normal. The right ventricular  size is normal.   3. Left atrial size was mildly dilated.   4. The mitral valve is  normal in structure. No evidence of mitral valve  regurgitation. No evidence of mitral stenosis.   5. The aortic valve is calcified. There is mild calcification of the  aortic valve. There is mild thickening of the aortic valve. Aortic valve  regurgitation is not visualized. No aortic stenosis is present.   6. The inferior vena cava is normal in size with greater than 50%  respiratory variability, suggesting right atrial pressure of 3 mmHg.  Past Medical History:  Diagnosis Date   Abnormal EKG 10/23/2021   Abnormal mammogram of left breast 09/04/2021   Abnormal stress test 10/23/2021   Acute chest pain 05/08/2016   AICD (automatic cardioverter/defibrillator) present    St Jude/Abbott device   AKI (acute kidney injury) (HCC) 10/23/2021   Angina pectoris (HCC) 09/11/2017   Anxiety 10/23/2021   Bacteremia 11/22/2021   Breast wound, right, subsequent encounter 09/04/2021   Burping 05/08/2016   Cancer (HCC)    cervical - hysterectomy   Cellulitis of right breast 10/23/2021   Cellulitis, wound, post-operative 10/09/2021   Chronic diastolic heart failure (HCC) 11/06/2016   Coronary artery disease involving native coronary artery of native heart with angina pectoris (HCC) 09/12/2015   Overview:  PCI and stent of RCA 2009, last cath 2012 with medical  therapy  CABG May 2017   Demand ischemia [facility])    Diabetic foot infection (HCC) 11/21/2021   Diabetic foot ulcer (HCC) 02/16/2020   Emphysema lung (HCC) 09/11/2017   patient not aware of this dx   Essential hypertension 09/12/2015   Gangrene (HCC) 10/23/2021   GERD (gastroesophageal reflux disease) 05/08/2016   Hematoma 10/23/2021   Hiatal hernia 10/23/2021   Hyperglycemia 10/23/2021   Hyperglycemia due to type 2 diabetes mellitus (HCC) 10/23/2021   Hyperlipidemia 09/12/2015   Hyponatremia 10/23/2021   ICD (implantable cardioverter-defibrillator) in place 05/16/2021   Infection of great toe 10/23/2021   Lactic acidosis 10/23/2021    Leukocytosis 10/23/2021   Mediastinitis 06/28/2016   Morbid obesity (HCC) 05/08/2016   Myocardial infarction [facility])    NSTEMI (non-ST elevated myocardial infarction) (HCC) 05/22/2020   Obesity (BMI 30-39.9) 10/23/2021   Perineal abscess 10/23/2021   Peripheral vascular disease (HCC)    S/P CABG (coronary artery bypass graft) 06/03/2016   Overview:  The patient underwent sternal reconstruction on 06/21/16 with pec flaps for mediastinitis from a prior CABG in May 2017. On admission, she was critically ill from sepsis and had altered mental status. She was last seen in clinic on 08/09/16 at which time she was doing well.   Severe sepsis (HCC) 06/03/2016   Sinus tachycardia 05/08/2016   Tobacco use disorder 04/20/2016   Overview:  Quit in May 2017.   Type 2 diabetes mellitus with foot ulcer (HCC) 05/08/2016   Unstable angina (HCC) 05/22/2020   Ventricular tachycardia (HCC) 12/17/2020   Wound, surgical, infected 06/06/2016   Overview:  sternal    Past Surgical History:  Procedure Laterality Date   ABDOMINAL AORTOGRAM W/LOWER EXTREMITY N/A 10/11/2021   Procedure: ABDOMINAL AORTOGRAM W/LOWER EXTREMITY;  Surgeon: Cephus Shelling, MD;  [facility];  Service: Cardiovascular;  Laterality: N/A;   AMPUTATION TOE Right 02/20/2020   Procedure: AMPUTATION RIGHT GREAT  TOE;  Surgeon: Candelaria Stagers, DPM;  Location: [facility] OR;  Service: Podiatry;  Laterality: Right;   BLADDER SURGERY     BONE BIOPSY Left 02/01/2022   Procedure: BONE BIOPSY LEFT FOOT, IRRIGATION AND DEBRIDEMENT;  Surgeon: Asencion Islam, DPM;  Location: [facility];  Service: Podiatry;  Laterality: Left;   BUBBLE STUDY  11/27/2021   Procedure: BUBBLE STUDY;  Surgeon: Pricilla Riffle, MD;  Location: [facility] ENDOSCOPY;  Service: Cardiovascular;;   CARDIAC CATHETERIZATION     CORONARY ARTERY BYPASS GRAFT     CORONARY STENT INTERVENTION N/A 05/25/2020   Procedure: CORONARY STENT INTERVENTION;  Surgeon: Iran Ouch, MD;  [facility]  INV[facility] LAB;  Service: Cardiovascular;  Laterality: N/A;   CORONARY STENT INTERVENTION N/A 06/22/2020   Procedure: CORONARY STENT INTERVENTION;  Surgeon: Marykay Lex, MD;  Location: [facility] INVASIVE CV LAB;  Service: Cardiovascular;  Laterality: N/A;   CORONARY ULTRASOUND/IVUS N/A 06/22/2020   Procedure: Intravascular Ultrasound/IVUS;  Surgeon: Marykay Lex, MD;  Location: [facility] INVASIVE CV LAB;  Service: Cardiovascular;  Laterality: N/A;   ICD IMPLANT N/A 12/19/2020   Procedure: ICD IMPLANT;  Surgeon: Marinus Maw, MD;  Location: [facility] INVASIVE CV LAB;  Service: Cardiovascular;  Laterality: N/A;   INCISION AND DRAINAGE Right 02/19/2020   Procedure: INCISION AND DRAINAGE;  Surgeon: Vivi Barrack, DPM;  Location[facility];  Service: Podiatry;  Laterality: Right;  Block done by surgeon   IRRIGATION AND DEBRIDEMENT FOOT Left 10/10/2021   Procedure: IRRIGATION AND DEBRIDEMENT FOOT;  Surgeon: Asencion Islam, DPM;  [facility];  Service: Podiatry;  Laterality: Left;   IRRIGATION AND DEBRIDEMENT FOOT Left 11/23/2021   Procedure: IRRIGATION AND DEBRIDEMENT FOOT;  Surgeon: Louann Sjogren, MD;  [facility];  Service: Podiatry;  Laterality: Left;  will do local block   LEFT HEART CATH AND CORS/GRAFTS ANGIOGRAPHY N/A 05/23/2020   Procedure: LEFT HEART CATH AND CORS/GRAFTS ANGIOGRAPHY;  Surgeon: Yvonne Kendall, MD;  [facility];  Service: Cardiovascular;  Laterality: N/A;   LEFT HEART CATH AND CORS/GRAFTS ANGIOGRAPHY N/A 12/19/2020   Procedure: LEFT HEART CATH AND CORS/GRAFTS ANGIOGRAPHY;  Surgeon: Kathleene Hazel, MD;  [facility];  Service: Cardiovascular;  Laterality: N/A;   LEFT HEART CATH AND CORS/GRAFTS ANGIOGRAPHY N/A 07/21/2021   Procedure: LEFT HEART CATH AND CORS/GRAFTS ANGIOGRAPHY;  Surgeon: Yvonne Kendall, MD;  [facility];  Service: Cardiovascular;  Laterality: N/A;   METATARSAL HEAD EXCISION Right 05/02/2020   Procedure: FIRST  METATARSAL HEAD RESECTION; RIGHT FOOT WOUND CLOSURE;  Surgeon: Asencion Islam, DPM;  Location: [facility];  Service: Podiatry;  Laterality: Right;  MAC W/LOCAL   PERIPHERAL VASCULAR BALLOON ANGIOPLASTY Left 10/11/2021   Procedure: PERIPHERAL VASCULAR BALLOON ANGIOPLASTY;  Surgeon: Cephus Shelling, MD;  [facility];  Service: Cardiovascular;  Laterality: Left;  Anterior Tibial Artery   TEE WITHOUT CARDIOVERSION N/A 11/27/2021   Procedure: TRANSESOPHAGEAL ECHOCARDIOGRAM (TEE);  Surgeon: Pricilla Riffle, MD;  Location: [facility] ENDOSCOPY;  Service: Cardiovascular;  Laterality: N/A;   TRANSMETATARSAL AMPUTATION Left 11/23/2021   Procedure: TRANSMETATARSAL AMPUTATION;  Surgeon: Louann Sjogren, MD;  Location: [facility] OR;  Service: Podiatry;  Laterality: Left;   TUBAL LIGATION      MEDICATIONS:  acetaminophen (TYLENOL) 325 MG tablet   amiodarone (PACERONE) 200 MG tablet   aspirin 81 MG chewable tablet   clopidogrel (PLAVIX) 75 MG tablet   Dihydroxyaluminum Sod Carb (ROLAIDS PO)   Evolocumab (REPATHA SURECLICK) 140 MG/ML SOAJ   FARXIGA 10 MG TABS tablet   furosemide (LASIX) 20 MG tablet   HUMALOG KWIKPEN 100 UNIT/ML KwikPen   LORazepam (ATIVAN) 1 MG tablet   metFORMIN (GLUCOPHAGE) 500 MG tablet   metoprolol succinate (TOPROL-XL) 25 MG 24 hr tablet   nitroGLYCERIN (NITROSTAT) 0.4 MG SL tablet   nystatin (MYCOSTATIN/NYSTOP) powder   Omega-3 Fatty Acids (FISH OIL) 500 MG CAPS   omeprazole (PRILOSEC) 20 MG capsule   promethazine (PHENERGAN) 25 MG tablet   rosuvastatin (CRESTOR) 20 MG tablet   senna-docusate (SENOKOT-S) 8.6-50 MG tablet   TOUJEO MAX SOLOSTAR 300 UNIT/ML Solostar Pen   valsartan (DIOVAN) 80 MG tablet   Vitamin D, Ergocalciferol, (DRISDOL) 1.25 MG (50000 UNIT) CAPS capsule   No current facility-administered medications for this encounter.     Jodell Cipro Ward, PA-C [facility] [phone]

## 2023-10-09 NOTE — Progress Notes (Addendum)
PCP - Marianne Sofia PA-C LOV 08-09-23 epic Cardiologist - LOV 3-13--24  Dr. Norman Herrlich  PPM/ICD - last device check 08-15-23 epic Device Orders - requested 10-09-23 Rep Notified -   Chest x-ray - 2022 EKG - 04-19-23 epic Stress Test -  ECHO - 03-12-23 epic Cardiac Cath -   Sleep Study -  CPAP -   Fasting Blood Sugar - 150-180 Checks Blood Sugar __1___ times a day  Blood Thinner Instructions:  Plavix pt. Took 10-08-23 @ 10:00 am  Aspirin Instructions:81 mg last dose 10-08-23 Farxiga last dose 10-06-23  ERAS Protcol - PRE-SURGERY Ensure or G2-     COVID vaccine -  Activity--Able to complete ADL's without SOB or CP  Anesthesia review: ICD,DM2, HTN, MI, CAD/Stent, CABG 5-17 ,COPD  Patient denies shortness of breath, fever, cough and chest pain at PAT appointment   All instructions explained to the patient, with a verbal understanding of the material. Patient agrees to go over the instructions while at home for a better understanding. Patient also instructed to self quarantine after being tested for COVID-19. The opportunity to ask questions was provided.

## 2023-10-09 NOTE — Progress Notes (Signed)
Please place orders in epic for preop 

## 2023-10-10 ENCOUNTER — Encounter (HOSPITAL_COMMUNITY): Admission: RE | Payer: Self-pay | Source: Home / Self Care

## 2023-10-10 ENCOUNTER — Other Ambulatory Visit: Payer: Self-pay | Admitting: Physician Assistant

## 2023-10-10 ENCOUNTER — Ambulatory Visit (HOSPITAL_COMMUNITY): Admission: RE | Admit: 2023-10-10 | Payer: BC Managed Care – PPO | Source: Home / Self Care | Admitting: Podiatry

## 2023-10-10 SURGERY — BIOPSY, BONE
Anesthesia: Choice | Laterality: Left

## 2023-10-10 NOTE — Progress Notes (Signed)
Cardiology Office Note:  .   Date:  10/10/2023  ID:  Jasmine Richardson, DOB 01/27/1961, MRN [id number] PCP: Marianne Sofia, Cordelia Poche  [facility] Providers Cardiologist:  Norman Herrlich, MD {  EP: Dr. Ladona Ridgel  History of Present Illness: .   Jasmine Richardson is a 62 y.o. female w/PMHx of CAD (CABG in 2017), ICM, VT, ICD, chronic CHF (systolic), RBBB, HTN, HLD, PVD (hx of transmetatarsal amputation (L)  Saw Dr. Ladona Ridgel last 2022, no recurrent sustained VT  + remotes  Following with Dr. Dulce Sellar regularly, last was on 02/27/23, doing well, no changes were made. Mentions she was following with wound clinic w/known PVD   Today's visit is scheduled as a pre-op device evaluation Pending bone bx, 2/2 MRI w/possible osteo   ROS:   She feels well No CP, palpitations or cardiac awareness No SOB No near syncope or syncope Denies difficulties with her ADLs   Device information Abbott dual chamber ICD implanted 12/19/2020 Secondary prevention  Arrhythmia/AAD hx Amiodarone started 2022  Studies Reviewed: [name]    EKG done today and reviewed by myself:  SR 69bpm, no significant changes from priors  DEVICE interrogation done today and reviewed by myself  Battery and lead measurements are good No arrhythmias No programming changes made   03/12/23: TTE 1. Left ventricular ejection fraction, by estimation, is 60 to 65%. The  left ventricle has normal function. The left ventricle has no regional  wall motion abnormalities. There is mild left ventricular hypertrophy.  Left ventricular diastolic function  could not be evaluated.   2. Right ventricular systolic function is normal. The right ventricular  size is normal.   3. Left atrial size was mildly dilated.   4. The mitral valve is normal in structure. No evidence of mitral valve  regurgitation. No evidence of mitral stenosis.   5. The aortic valve is calcified. There is mild calcification of the  aortic valve. There is mild thickening of  the aortic valve. Aortic valve  regurgitation is not visualized. No aortic stenosis is present.   6. The inferior vena cava is normal in size with greater than 50%  respiratory variability, suggesting right atrial pressure of 3 mmHg.    07/21/21: [facility] Conclusions: Severe native coronary artery disease, as detailed below, not significantly changed from prior catheterization in 12/2020. Widely patent LIMA-LAD graft and LMCA stent. Patent mid RCA stents with moderate restenosis (~40%). Moderately reduced left ventricular systolic function (LVEF 40-45%). Upper normal left ventricular filling pressure (LVEDP 15 mmHg).   Recommendations: Escalate antianginal therapy; no focal targets again noted on today's catheterization. Aggressive secondary prevention.  Risk Assessment/Calculations:    Physical Exam:   VS:  LMP  (LMP Unknown)    Wt Readings from Last 3 Encounters:  10/09/23 220 lb (99.8 kg)  08/09/23 220 lb 6.4 oz (100 kg)  04/18/23 227 lb (103 kg)    GEN: Well nourished, well developed in no acute distress NECK: No JVD; No carotid bruits CARDIAC: RRR, no murmurs, rubs, gallops RESPIRATORY:  CTA b/l without rales, wheezing or rhonchi  ABDOMEN: Soft, non-tender, non-distended EXTREMITIES:  No edema; No deformity   PPM/ICD/ILR site: is stable, no thinning, fluctuation, tethering  ASSESSMENT AND PLAN: .    ICD Intact function No programming changes made  CAD No symptoms On ASA, plavix, BB, statin and repatha C/w Dr. Dulce Sellar  ICM Chronic CHF Recovered LVEF No symptoms or exam findings of volume OL CorVue wobbles about threshold On BB, ARB, lasix, farxiga C/w Dr.  Munley  She reports labs done regularly with her PMD and Dr. Dulce Sellar  VT chronic amiodarone No VT noted Discussed reducing amiodarone dose, she would like Drs. Munley and Ladona Ridgel to weigh in No changes today  PVD L foot wound pocket looks good C/w IM/podiatry/Dr. Dulce Sellar  Pre-op device evaluation Device  function is intact No particular peri-procedure device management required for foot biopsy   Dispo: remotes as usual, back in clinic with EP again in a year, sooner if needed  Signed, Sheilah Pigeon, PA-C

## 2023-10-11 ENCOUNTER — Ambulatory Visit: Payer: BC Managed Care – PPO | Attending: Physician Assistant | Admitting: Physician Assistant

## 2023-10-11 ENCOUNTER — Encounter: Payer: Self-pay | Admitting: Physician Assistant

## 2023-10-11 VITALS — BP 118/70 | HR 69 | Ht 67.0 in | Wt 223.6 lb

## 2023-10-11 DIAGNOSIS — I25119 Atherosclerotic heart disease of native coronary artery with unspecified angina pectoris: Secondary | ICD-10-CM

## 2023-10-11 DIAGNOSIS — I472 Ventricular tachycardia, unspecified: Secondary | ICD-10-CM

## 2023-10-11 DIAGNOSIS — I739 Peripheral vascular disease, unspecified: Secondary | ICD-10-CM

## 2023-10-11 DIAGNOSIS — Z95810 Presence of automatic (implantable) cardiac defibrillator: Secondary | ICD-10-CM

## 2023-10-11 DIAGNOSIS — I255 Ischemic cardiomyopathy: Secondary | ICD-10-CM

## 2023-10-11 LAB — CUP PACEART INCLINIC DEVICE CHECK
Date Time Interrogation Session: 20241025122152
Implantable Lead Connection Status: 753985
Implantable Lead Connection Status: 753985
Implantable Lead Implant Date: 20220103
Implantable Lead Implant Date: 20220103
Implantable Lead Location: 753859
Implantable Lead Location: 753860
Implantable Pulse Generator Implant Date: 20220103
Lead Channel Pacing Threshold Amplitude: 1 V
Lead Channel Pacing Threshold Amplitude: 1 V
Lead Channel Pacing Threshold Pulse Width: 0.5 ms
Lead Channel Pacing Threshold Pulse Width: 0.5 ms
Lead Channel Sensing Intrinsic Amplitude: 12 mV
Lead Channel Sensing Intrinsic Amplitude: 5 mV
Pulse Gen Serial Number: 111037319

## 2023-10-11 NOTE — Patient Instructions (Signed)
Medication Instructions:  Your physician recommends that you continue on your current medications as directed. Please refer to the Current Medication list given to you today.  *If you need a refill on your cardiac medications before your next appointment, please call your pharmacy*  Lab Work: None ordered If you have labs (blood work) drawn today and your tests are completely normal, you will receive your results only by: MyChart Message (if you have MyChart) OR A paper copy in the mail If you have any lab test that is abnormal or we need to change your treatment, we will call you to review the results.  Follow-Up: At [facility], you and your health needs are our priority.  As part of our continuing mission to provide you with exceptional heart care, we have created designated Provider Care Teams.  These Care Teams include your primary Cardiologist (physician) and Advanced Practice Providers (APPs -  Physician Assistants and Nurse Practitioners) who all work together to provide you with the care you need, when you need it.  Your next appointment:   1 year(s)  Provider:   Gregg Taylor, MD  

## 2023-10-14 ENCOUNTER — Encounter: Payer: Self-pay | Admitting: Internal Medicine

## 2023-10-14 NOTE — Progress Notes (Signed)
PERIOPERATIVE PRESCRIPTION FOR IMPLANTED CARDIAC DEVICE PROGRAMMING  Patient Information: Name:  Teryl Osterhoudt  DOB:  1961/04/03  MRN:  [id number]    Planned Procedure: Bone Biopsy fifth metatarsal left, Excisional debridement of ulcer left  Surgeon: Gala Lewandowsky, MD  Date of Procedure:  10-10-23   Cautery will be used.  Position during surgery:  N/A   Device Information:  Clinic EP Physician:  Lewayne Bunting, MD   Device Type:  Defibrillator Manufacturer and Phone #:  St. Jude/Abbott: [phone] Pacemaker Dependent?:  No. Date of Last Device Check:  10/11/23 in clinic Normal Device Function?:  Yes.    Electrophysiologist's Recommendations:  Have magnet available. Provide continuous ECG monitoring when magnet is used or reprogramming is to be performed.  Procedure should not interfere with device function.  No device programming or magnet placement needed.  Per Device Clinic Standing Orders, Kizzie Ide, RN  12:34 PM 10/14/2023

## 2023-10-16 ENCOUNTER — Ambulatory Visit: Payer: BC Managed Care – PPO | Admitting: Physician Assistant

## 2023-10-16 ENCOUNTER — Encounter: Payer: BC Managed Care – PPO | Admitting: Podiatry

## 2023-10-16 ENCOUNTER — Encounter: Payer: Self-pay | Admitting: Physician Assistant

## 2023-10-16 VITALS — BP 120/70 | HR 73 | Temp 97.9°F | Resp 16 | Ht 67.0 in | Wt 224.0 lb

## 2023-10-16 DIAGNOSIS — N3001 Acute cystitis with hematuria: Secondary | ICD-10-CM | POA: Diagnosis not present

## 2023-10-16 DIAGNOSIS — Z01818 Encounter for other preprocedural examination: Secondary | ICD-10-CM | POA: Insufficient documentation

## 2023-10-16 DIAGNOSIS — I739 Peripheral vascular disease, unspecified: Secondary | ICD-10-CM

## 2023-10-16 DIAGNOSIS — E11621 Type 2 diabetes mellitus with foot ulcer: Secondary | ICD-10-CM | POA: Diagnosis not present

## 2023-10-16 DIAGNOSIS — M86672 Other chronic osteomyelitis, left ankle and foot: Secondary | ICD-10-CM

## 2023-10-16 DIAGNOSIS — L304 Erythema intertrigo: Secondary | ICD-10-CM

## 2023-10-16 DIAGNOSIS — L97422 Non-pressure chronic ulcer of left heel and midfoot with fat layer exposed: Secondary | ICD-10-CM

## 2023-10-16 HISTORY — DX: Other chronic osteomyelitis, left ankle and foot: M86.672

## 2023-10-16 HISTORY — DX: Encounter for other preprocedural examination: Z01.818

## 2023-10-16 LAB — POCT URINALYSIS DIP (CLINITEK)
Bilirubin, UA: NEGATIVE
Glucose, UA: 250 mg/dL — AB
Ketones, POC UA: NEGATIVE mg/dL
Leukocytes, UA: NEGATIVE
Nitrite, UA: POSITIVE — AB
POC PROTEIN,UA: 30 — AB
Spec Grav, UA: 1.02 (ref 1.010–1.025)
Urobilinogen, UA: NEGATIVE U/dL — AB
pH, UA: 5.5 (ref 5.0–8.0)

## 2023-10-16 MED ORDER — NYSTATIN 100000 UNIT/GM EX POWD: 1.0000 | Freq: Every day | CUTANEOUS | 2 refills | Status: DC | PRN

## 2023-10-16 MED ORDER — NITROFURANTOIN MONOHYD MACRO 100 MG PO CAPS: 100.0000 mg | ORAL_CAPSULE | Freq: Two times a day (BID) | ORAL | 0 refills | Status: DC

## 2023-10-16 NOTE — Progress Notes (Signed)
Subjective:  [name], female    DOB: [birth year]  Age: 62 y.o. MRN: [id number]  Chief Complaint  Patient presents with   Pre-op Exam    HPI Pt in today for preop exam  She is scheduled on 11/14 with Dr Logan Bores for debridement of left foot ulcer and bone biopsy of left 5th metatarsal She has had the ulcer for quite awhile and having the biopsy done because of osteomyelitis Pt did see cardiology on 10/11/23 - exam stable -  EKG done at that time and showed no acute changes Pt had cbc and cmp on 10/09/23 which were stable except glucose elevated at 276  Pt complains of dysuria, urgency and polyuria for the past week.  Denies nausea or vomiting.  Denies abdominal pain  Pt requests refill of nystatin powder that she uses for intertrigo    10/16/2023    2:11 PM 08/09/2023   10:50 AM 03/19/2023    9:53 AM 09/20/2022   11:17 AM 03/15/2022    3:05 PM  Depression screen PHQ 2/9  Decreased Interest 0 0 0 0 0  Down, Depressed, Hopeless 0 0 1 0 0  PHQ - 2 Score 0 0 1 0 0  Altered sleeping 1 0 1 3   Tired, decreased energy 1 0 1 0   Change in appetite 0 0 0 0   Feeling bad or failure about yourself  0 0 0 0   Trouble concentrating 0 0 0 0   Moving slowly or fidgety/restless 0 0 0 0   Suicidal thoughts 0 0 0 0   PHQ-9 Score 2 0 3 3   Difficult doing work/chores Not difficult at all Not difficult at all Not difficult at all Not difficult at all         02/01/2022   12:07 PM 03/15/2022    3:07 PM 03/19/2023    9:53 AM 08/09/2023   10:49 AM 10/16/2023    2:10 PM  Fall Risk  Falls in the past year?  0 1 0 0  Was there an injury with Fall?  0 1 0 0  Fall Risk Category Calculator  0 2 0 0  Fall Risk Category (Retired)  Low     (RETIRED) Patient Fall Risk Level Moderate fall risk Low fall risk     Patient at Risk for Falls Due to   History of fall(s);No Fall Risks No Fall Risks No Fall Risks  Fall risk Follow up  Falls evaluation completed Falls evaluation completed Falls  evaluation completed Falls evaluation completed     ROS CONSTITUTIONAL: Negative for chills, fatigue, fever, unintentional  E/N/T: Negative for ear pain, nasal congestion and sore throat.  CARDIOVASCULAR: Negative for chest pain, dizziness, palpitations and pedal edema.  RESPIRATORY: Negative for recent cough and dyspnea.  GASTROINTESTINAL: Negative for abdominal pain, acid reflux symptoms, constipation, diarrhea, nausea and vomiting. GU - see HPI  MSK: see HPI INTEGUMENTARY: Negative for rash.  NEUROLOGICAL: Negative for dizziness and headaches.  PSYCHIATRIC: Negative for sleep disturbance and to question depression screen.  Negative for depression, negative for anhedonia.    Current Outpatient Medications:    acetaminophen (TYLENOL) 325 MG tablet, Take 2 tablets (650 mg total) by mouth every 4 (four) hours as needed for headache or mild pain., Disp: , Rfl:    amiodarone (PACERONE) 200 MG tablet, Take 1 tablet by mouth once daily, Disp: 90 tablet, Rfl: 2   aspirin 81 MG chewable tablet, Chew 81 mg by  mouth daily., Disp: , Rfl:    clopidogrel (PLAVIX) 75 MG tablet, Take 1 tablet (75 mg total) by mouth daily., Disp: 90 tablet, Rfl: 2   Evolocumab (REPATHA SURECLICK) 140 MG/ML SOAJ, Inject 140 mg into the skin every 14 (fourteen) days., Disp: 2 mL, Rfl: 1   FARXIGA 10 MG TABS tablet, Take 1 tablet by mouth once daily, Disp: 90 tablet, Rfl: 0   furosemide (LASIX) 20 MG tablet, Take 1 tablet (20 mg total) by mouth daily., Disp: 90 tablet, Rfl: 3   HUMALOG KWIKPEN 100 UNIT/ML KwikPen, Inject 10-50 Units into the skin at bedtime. Sliding scale, Disp: 15 mL, Rfl: 12   insulin glargine, 2 Unit Dial, (TOUJEO MAX SOLOSTAR) 300 UNIT/ML Solostar Pen, Inject 58 Units into the skin daily., Disp: , Rfl:    LORazepam (ATIVAN) 1 MG tablet, Take 1 tablet (1 mg total) by mouth as needed for anxiety., Disp: 30 tablet, Rfl: 0   metFORMIN (GLUCOPHAGE) 500 MG tablet, TAKE 1 TABLET BY MOUTH TWICE DAILY WITH A  MEAL, Disp: 180 tablet, Rfl: 0   metoprolol succinate (TOPROL-XL) 25 MG 24 hr tablet, Take 1 tablet (25 mg total) by mouth in the morning, at noon, and at bedtime., Disp: 270 tablet, Rfl: 2   nitrofurantoin, macrocrystal-monohydrate, (MACROBID) 100 MG capsule, Take 1 capsule (100 mg total) by mouth 2 (two) times daily., Disp: 20 capsule, Rfl: 0   nitroGLYCERIN (NITROSTAT) 0.4 MG SL tablet, Place 1 tablet (0.4 mg total) under the tongue every 5 (five) minutes as needed for chest pain., Disp: 25 tablet, Rfl: 6   Omega-3 Fatty Acids (FISH OIL) 500 MG CAPS, Take 500 mg by mouth at bedtime., Disp: , Rfl:    omeprazole (PRILOSEC) 20 MG capsule, Take 1 capsule (20 mg total) by mouth daily., Disp: 90 capsule, Rfl: 1   promethazine (PHENERGAN) 25 MG tablet, Take 25 mg by mouth every 6 (six) hours as needed for nausea or vomiting., Disp: , Rfl:    rosuvastatin (CRESTOR) 20 MG tablet, Take 1 tablet (20 mg total) by mouth daily., Disp: 90 tablet, Rfl: 2   senna-docusate (SENOKOT-S) 8.6-50 MG tablet, Take 1 tablet by mouth every other day., Disp: , Rfl:    valsartan (DIOVAN) 80 MG tablet, Take 1 tablet (80 mg total) by mouth 2 (two) times daily., Disp: 180 tablet, Rfl: 2   Vitamin D, Ergocalciferol, (DRISDOL) 1.25 MG (50000 UNIT) CAPS capsule, TAKE 1 CAPSULE BY MOUTH EVERY SATURDAY, Disp: 12 capsule, Rfl: 0   nystatin (MYCOSTATIN/NYSTOP) powder, Apply 1 Application topically daily as needed (rash/yeast)., Disp: 15 g, Rfl: 2  Past Medical History:  Diagnosis Date   Abnormal EKG 10/23/2021   Abnormal mammogram of left breast 09/04/2021   Abnormal stress test 10/23/2021   Acute chest pain 05/08/2016   AICD (automatic cardioverter/defibrillator) present    St Jude/Abbott device   AKI (acute kidney injury) (HCC) 10/23/2021   Angina pectoris (HCC) 09/11/2017   Anxiety 10/23/2021   Bacteremia 11/22/2021   Breast wound, right, subsequent encounter 09/04/2021   Burping 05/08/2016   Cancer (HCC)    cervical -  hysterectomy   Cellulitis of right breast 10/23/2021   Cellulitis, wound, post-operative 10/09/2021   Chronic diastolic heart failure (HCC) 11/06/2016   Coronary artery disease involving native coronary artery of native heart with angina pectoris (HCC) 09/12/2015   Overview:  PCI and stent of RCA 2009, last cath 2012 with medical therapy  CABG May 2017   Demand ischemia [facility])  Diabetic foot infection (HCC) 11/21/2021   Diabetic foot ulcer (HCC) 02/16/2020   Emphysema lung (HCC) 09/11/2017   patient not aware of this dx   Essential hypertension 09/12/2015   Gangrene (HCC) 10/23/2021   GERD (gastroesophageal reflux disease) 05/08/2016   Hematoma 10/23/2021   Hiatal hernia 10/23/2021   Hyperglycemia 10/23/2021   Hyperglycemia due to type 2 diabetes mellitus (HCC) 10/23/2021   Hyperlipidemia 09/12/2015   Hyponatremia 10/23/2021   ICD (implantable cardioverter-defibrillator) in place 05/16/2021   Infection of great toe 10/23/2021   Lactic acidosis 10/23/2021   Leukocytosis 10/23/2021   Mediastinitis 06/28/2016   Morbid obesity (HCC) 05/08/2016   Myocardial infarction [facility])    NSTEMI (non-ST elevated myocardial infarction) (HCC) 05/22/2020   Obesity (BMI 30-39.9) 10/23/2021   Perineal abscess 10/23/2021   Peripheral vascular disease (HCC)    S/P CABG (coronary artery bypass graft) 06/03/2016   Overview:  The patient underwent sternal reconstruction on 06/21/16 with pec flaps for mediastinitis from a prior CABG in May 2017. On admission, she was critically ill from sepsis and had altered mental status. She was last seen in clinic on 08/09/16 at which time she was doing well.   Severe sepsis (HCC) 06/03/2016   Sinus tachycardia 05/08/2016   Tobacco use disorder 04/20/2016   Overview:  Quit in May 2017.   Type 2 diabetes mellitus with foot ulcer (HCC) 05/08/2016   Unstable angina (HCC) 05/22/2020   Ventricular tachycardia (HCC) 12/17/2020   Wound, surgical, infected 06/06/2016    Overview:  sternal   Objective:  PHYSICAL EXAM:   BP 120/70 (BP Location: Left Arm, Patient Position: Sitting, Cuff Size: Large)   Pulse 73   Temp 97.9 F (36.6 C) (Temporal)   Resp 16   Ht 5\' 7"  (1.702 m)   Wt 224 lb (101.6 kg)   LMP  (LMP Unknown)   SpO2 97%   BMI 35.08 kg/m    GEN: Well nourished, well developed, in no acute distress  HEENT: normal external ears and nose - normal external auditory canals and TMS -  - Lips, Teeth and Gums - normal  Oropharynx - normal mucosa, palate, and posterior pharynx Cardiac: RRR; no murmurs, rubs, or gallops,no edema -  Respiratory:  normal respiratory rate and pattern with no distress - normal breath sounds with no rales, rhonchi, wheezes or rubs GI: normal bowel sounds, no masses or tenderness MS: right foot with no lesions - right great toe amputation noted - left foot is bandaged Skin: warm and dry, no rash  Neuro:  Alert and Oriented x 3, - CN II-Xii grossly intact Psych: euthymic mood, appropriate affect and demeanor  Office Visit on 10/16/2023  Component Date Value Ref Range Status   Glucose, UA 10/16/2023 =250 (A)  negative mg/dL Final   Bilirubin, UA 40/98/1191 negative  negative Final   Ketones, POC UA 10/16/2023 negative  negative mg/dL Final   Spec Grav, UA 47/82/9562 1.020  1.010 - 1.025 Final   Blood, UA 10/16/2023 moderate (A)  negative Final   pH, UA 10/16/2023 5.5  5.0 - 8.0 Final   POC PROTEIN,UA 10/16/2023 =30 (A)  negative, trace Final   Urobilinogen, UA 10/16/2023 negative (A)  0.2 or 1.0 E.U./dL Final   Nitrite, UA 13/07/6577 Positive (A)  Negative Final   Leukocytes, UA 10/16/2023 Negative  Negative Final    Assessment & Plan:    Diabetic ulcer of left heel associated with type 2 diabetes mellitus, with fat layer exposed (HCC) Follow up with  surgeon as directed Chronic osteomyelitis of left foot (HCC) Follow up with surgeon as directed Acute hemorrhagic cystitis -     POCT URINALYSIS DIP (CLINITEK) -      Urine Culture -     Nitrofurantoin Monohyd Macro; Take 1 capsule (100 mg total) by mouth 2 (two) times daily.  Dispense: 20 capsule; Refill: 0  Peripheral vascular disease (HCC) Continue current meds Intertrigo -     Nystatin; Apply 1 Application topically daily as needed (rash/yeast).  Dispense: 15 g; Refill: 2  Pre-op exam Forms filled out Recommended cardiology preop evaluation - done on 10/11/23    Follow-up: Return for as scheduled for chronic visit 11/27.  An After Visit Summary was printed and given to the patient.  Jettie Pagan [facility] [phone]

## 2023-10-16 NOTE — H&P (View-Only) (Signed)
Subjective:  [name], female    DOB: [birth year]  Age: 62 y.o. MRN: [id number]  Chief Complaint  Patient presents with   Pre-op Exam    HPI Pt in today for preop exam  She is scheduled on 11/14 with Dr Logan Bores for debridement of left foot ulcer and bone biopsy of left 5th metatarsal She has had the ulcer for quite awhile and having the biopsy done because of osteomyelitis Pt did see cardiology on 10/11/23 - exam stable -  EKG done at that time and showed no acute changes Pt had cbc and cmp on 10/09/23 which were stable except glucose elevated at 276  Pt complains of dysuria, urgency and polyuria for the past week.  Denies nausea or vomiting.  Denies abdominal pain  Pt requests refill of nystatin powder that she uses for intertrigo    10/16/2023    2:11 PM 08/09/2023   10:50 AM 03/19/2023    9:53 AM 09/20/2022   11:17 AM 03/15/2022    3:05 PM  Depression screen PHQ 2/9  Decreased Interest 0 0 0 0 0  Down, Depressed, Hopeless 0 0 1 0 0  PHQ - 2 Score 0 0 1 0 0  Altered sleeping 1 0 1 3   Tired, decreased energy 1 0 1 0   Change in appetite 0 0 0 0   Feeling bad or failure about yourself  0 0 0 0   Trouble concentrating 0 0 0 0   Moving slowly or fidgety/restless 0 0 0 0   Suicidal thoughts 0 0 0 0   PHQ-9 Score 2 0 3 3   Difficult doing work/chores Not difficult at all Not difficult at all Not difficult at all Not difficult at all         02/01/2022   12:07 PM 03/15/2022    3:07 PM 03/19/2023    9:53 AM 08/09/2023   10:49 AM 10/16/2023    2:10 PM  Fall Risk  Falls in the past year?  0 1 0 0  Was there an injury with Fall?  0 1 0 0  Fall Risk Category Calculator  0 2 0 0  Fall Risk Category (Retired)  Low     (RETIRED) Patient Fall Risk Level Moderate fall risk Low fall risk     Patient at Risk for Falls Due to   History of fall(s);No Fall Risks No Fall Risks No Fall Risks  Fall risk Follow up  Falls evaluation completed Falls evaluation completed Falls  evaluation completed Falls evaluation completed     ROS CONSTITUTIONAL: Negative for chills, fatigue, fever, unintentional  E/N/T: Negative for ear pain, nasal congestion and sore throat.  CARDIOVASCULAR: Negative for chest pain, dizziness, palpitations and pedal edema.  RESPIRATORY: Negative for recent cough and dyspnea.  GASTROINTESTINAL: Negative for abdominal pain, acid reflux symptoms, constipation, diarrhea, nausea and vomiting. GU - see HPI  MSK: see HPI INTEGUMENTARY: Negative for rash.  NEUROLOGICAL: Negative for dizziness and headaches.  PSYCHIATRIC: Negative for sleep disturbance and to question depression screen.  Negative for depression, negative for anhedonia.    Current Outpatient Medications:    acetaminophen (TYLENOL) 325 MG tablet, Take 2 tablets (650 mg total) by mouth every 4 (four) hours as needed for headache or mild pain., Disp: , Rfl:    amiodarone (PACERONE) 200 MG tablet, Take 1 tablet by mouth once daily, Disp: 90 tablet, Rfl: 2   aspirin 81 MG chewable tablet, Chew 81 mg by  mouth daily., Disp: , Rfl:    clopidogrel (PLAVIX) 75 MG tablet, Take 1 tablet (75 mg total) by mouth daily., Disp: 90 tablet, Rfl: 2   Evolocumab (REPATHA SURECLICK) 140 MG/ML SOAJ, Inject 140 mg into the skin every 14 (fourteen) days., Disp: 2 mL, Rfl: 1   FARXIGA 10 MG TABS tablet, Take 1 tablet by mouth once daily, Disp: 90 tablet, Rfl: 0   furosemide (LASIX) 20 MG tablet, Take 1 tablet (20 mg total) by mouth daily., Disp: 90 tablet, Rfl: 3   HUMALOG KWIKPEN 100 UNIT/ML KwikPen, Inject 10-50 Units into the skin at bedtime. Sliding scale, Disp: 15 mL, Rfl: 12   insulin glargine, 2 Unit Dial, (TOUJEO MAX SOLOSTAR) 300 UNIT/ML Solostar Pen, Inject 58 Units into the skin daily., Disp: , Rfl:    LORazepam (ATIVAN) 1 MG tablet, Take 1 tablet (1 mg total) by mouth as needed for anxiety., Disp: 30 tablet, Rfl: 0   metFORMIN (GLUCOPHAGE) 500 MG tablet, TAKE 1 TABLET BY MOUTH TWICE DAILY WITH A  MEAL, Disp: 180 tablet, Rfl: 0   metoprolol succinate (TOPROL-XL) 25 MG 24 hr tablet, Take 1 tablet (25 mg total) by mouth in the morning, at noon, and at bedtime., Disp: 270 tablet, Rfl: 2   nitrofurantoin, macrocrystal-monohydrate, (MACROBID) 100 MG capsule, Take 1 capsule (100 mg total) by mouth 2 (two) times daily., Disp: 20 capsule, Rfl: 0   nitroGLYCERIN (NITROSTAT) 0.4 MG SL tablet, Place 1 tablet (0.4 mg total) under the tongue every 5 (five) minutes as needed for chest pain., Disp: 25 tablet, Rfl: 6   Omega-3 Fatty Acids (FISH OIL) 500 MG CAPS, Take 500 mg by mouth at bedtime., Disp: , Rfl:    omeprazole (PRILOSEC) 20 MG capsule, Take 1 capsule (20 mg total) by mouth daily., Disp: 90 capsule, Rfl: 1   promethazine (PHENERGAN) 25 MG tablet, Take 25 mg by mouth every 6 (six) hours as needed for nausea or vomiting., Disp: , Rfl:    rosuvastatin (CRESTOR) 20 MG tablet, Take 1 tablet (20 mg total) by mouth daily., Disp: 90 tablet, Rfl: 2   senna-docusate (SENOKOT-S) 8.6-50 MG tablet, Take 1 tablet by mouth every other day., Disp: , Rfl:    valsartan (DIOVAN) 80 MG tablet, Take 1 tablet (80 mg total) by mouth 2 (two) times daily., Disp: 180 tablet, Rfl: 2   Vitamin D, Ergocalciferol, (DRISDOL) 1.25 MG (50000 UNIT) CAPS capsule, TAKE 1 CAPSULE BY MOUTH EVERY SATURDAY, Disp: 12 capsule, Rfl: 0   nystatin (MYCOSTATIN/NYSTOP) powder, Apply 1 Application topically daily as needed (rash/yeast)., Disp: 15 g, Rfl: 2  Past Medical History:  Diagnosis Date   Abnormal EKG 10/23/2021   Abnormal mammogram of left breast 09/04/2021   Abnormal stress test 10/23/2021   Acute chest pain 05/08/2016   AICD (automatic cardioverter/defibrillator) present    St Jude/Abbott device   AKI (acute kidney injury) (HCC) 10/23/2021   Angina pectoris (HCC) 09/11/2017   Anxiety 10/23/2021   Bacteremia 11/22/2021   Breast wound, right, subsequent encounter 09/04/2021   Burping 05/08/2016   Cancer (HCC)    cervical -  hysterectomy   Cellulitis of right breast 10/23/2021   Cellulitis, wound, post-operative 10/09/2021   Chronic diastolic heart failure (HCC) 11/06/2016   Coronary artery disease involving native coronary artery of native heart with angina pectoris (HCC) 09/12/2015   Overview:  PCI and stent of RCA 2009, last cath 2012 with medical therapy  CABG May 2017   Demand ischemia [facility])  Diabetic foot infection (HCC) 11/21/2021   Diabetic foot ulcer (HCC) 02/16/2020   Emphysema lung (HCC) 09/11/2017   patient not aware of this dx   Essential hypertension 09/12/2015   Gangrene (HCC) 10/23/2021   GERD (gastroesophageal reflux disease) 05/08/2016   Hematoma 10/23/2021   Hiatal hernia 10/23/2021   Hyperglycemia 10/23/2021   Hyperglycemia due to type 2 diabetes mellitus (HCC) 10/23/2021   Hyperlipidemia 09/12/2015   Hyponatremia 10/23/2021   ICD (implantable cardioverter-defibrillator) in place 05/16/2021   Infection of great toe 10/23/2021   Lactic acidosis 10/23/2021   Leukocytosis 10/23/2021   Mediastinitis 06/28/2016   Morbid obesity (HCC) 05/08/2016   Myocardial infarction [facility])    NSTEMI (non-ST elevated myocardial infarction) (HCC) 05/22/2020   Obesity (BMI 30-39.9) 10/23/2021   Perineal abscess 10/23/2021   Peripheral vascular disease (HCC)    S/P CABG (coronary artery bypass graft) 06/03/2016   Overview:  The patient underwent sternal reconstruction on 06/21/16 with pec flaps for mediastinitis from a prior CABG in May 2017. On admission, she was critically ill from sepsis and had altered mental status. She was last seen in clinic on 08/09/16 at which time she was doing well.   Severe sepsis (HCC) 06/03/2016   Sinus tachycardia 05/08/2016   Tobacco use disorder 04/20/2016   Overview:  Quit in May 2017.   Type 2 diabetes mellitus with foot ulcer (HCC) 05/08/2016   Unstable angina (HCC) 05/22/2020   Ventricular tachycardia (HCC) 12/17/2020   Wound, surgical, infected 06/06/2016    Overview:  sternal   Objective:  PHYSICAL EXAM:   BP 120/70 (BP Location: Left Arm, Patient Position: Sitting, Cuff Size: Large)   Pulse 73   Temp 97.9 F (36.6 C) (Temporal)   Resp 16   Ht 5\' 7"  (1.702 m)   Wt 224 lb (101.6 kg)   LMP  (LMP Unknown)   SpO2 97%   BMI 35.08 kg/m    GEN: Well nourished, well developed, in no acute distress  HEENT: normal external ears and nose - normal external auditory canals and TMS -  - Lips, Teeth and Gums - normal  Oropharynx - normal mucosa, palate, and posterior pharynx Cardiac: RRR; no murmurs, rubs, or gallops,no edema -  Respiratory:  normal respiratory rate and pattern with no distress - normal breath sounds with no rales, rhonchi, wheezes or rubs GI: normal bowel sounds, no masses or tenderness MS: right foot with no lesions - right great toe amputation noted - left foot is bandaged Skin: warm and dry, no rash  Neuro:  Alert and Oriented x 3, - CN II-Xii grossly intact Psych: euthymic mood, appropriate affect and demeanor  Office Visit on 10/16/2023  Component Date Value Ref Range Status   Glucose, UA 10/16/2023 =250 (A)  negative mg/dL Final   Bilirubin, UA 40/98/1191 negative  negative Final   Ketones, POC UA 10/16/2023 negative  negative mg/dL Final   Spec Grav, UA 47/82/9562 1.020  1.010 - 1.025 Final   Blood, UA 10/16/2023 moderate (A)  negative Final   pH, UA 10/16/2023 5.5  5.0 - 8.0 Final   POC PROTEIN,UA 10/16/2023 =30 (A)  negative, trace Final   Urobilinogen, UA 10/16/2023 negative (A)  0.2 or 1.0 E.U./dL Final   Nitrite, UA 13/07/6577 Positive (A)  Negative Final   Leukocytes, UA 10/16/2023 Negative  Negative Final    Assessment & Plan:    Diabetic ulcer of left heel associated with type 2 diabetes mellitus, with fat layer exposed (HCC) Follow up with  surgeon as directed Chronic osteomyelitis of left foot (HCC) Follow up with surgeon as directed Acute hemorrhagic cystitis -     POCT URINALYSIS DIP (CLINITEK) -      Urine Culture -     Nitrofurantoin Monohyd Macro; Take 1 capsule (100 mg total) by mouth 2 (two) times daily.  Dispense: 20 capsule; Refill: 0  Peripheral vascular disease (HCC) Continue current meds Intertrigo -     Nystatin; Apply 1 Application topically daily as needed (rash/yeast).  Dispense: 15 g; Refill: 2  Pre-op exam Forms filled out Recommended cardiology preop evaluation - done on 10/11/23    Follow-up: Return for as scheduled for chronic visit 11/27.  An After Visit Summary was printed and given to the patient.  Jettie Pagan [facility] [phone]

## 2023-10-18 ENCOUNTER — Telehealth: Payer: Self-pay | Admitting: Cardiology

## 2023-10-18 NOTE — Telephone Encounter (Signed)
   Pre-operative Risk Assessment    Patient Name: Jasmine Richardson  DOB: [birth year] MRN: [id number]      Request for Surgical Clearance    Procedure:   Debridement and bone biopsy of left foot   Date of Surgery:  Clearance 10/31/23                                 Surgeon:  Dr Logan Bores Surgeon's Group or Practice Name:  [facility] Phone number:  [phone] Fax number:  [phone]   Type of Clearance Requested:   - Medical  - Pharmacy:  Hold TBD  TBD   Type of Anesthesia:  Not Indicated   Additional requests/questions:      SignedFilomena Jungling   10/18/2023, 12:02 PM

## 2023-10-20 LAB — URINE CULTURE

## 2023-10-21 DIAGNOSIS — L97522 Non-pressure chronic ulcer of other part of left foot with fat layer exposed: Secondary | ICD-10-CM | POA: Diagnosis not present

## 2023-10-23 ENCOUNTER — Encounter: Payer: BC Managed Care – PPO | Admitting: Podiatry

## 2023-10-23 IMAGING — CR DG FOOT COMPLETE 3+V*L*
3 series · 3 of 3 positions shown · non-contrast
Comparison: None.

CLINICAL DATA: Left foot pain

EXAM:
LEFT FOOT - COMPLETE 3+ VIEW

[foot ap]
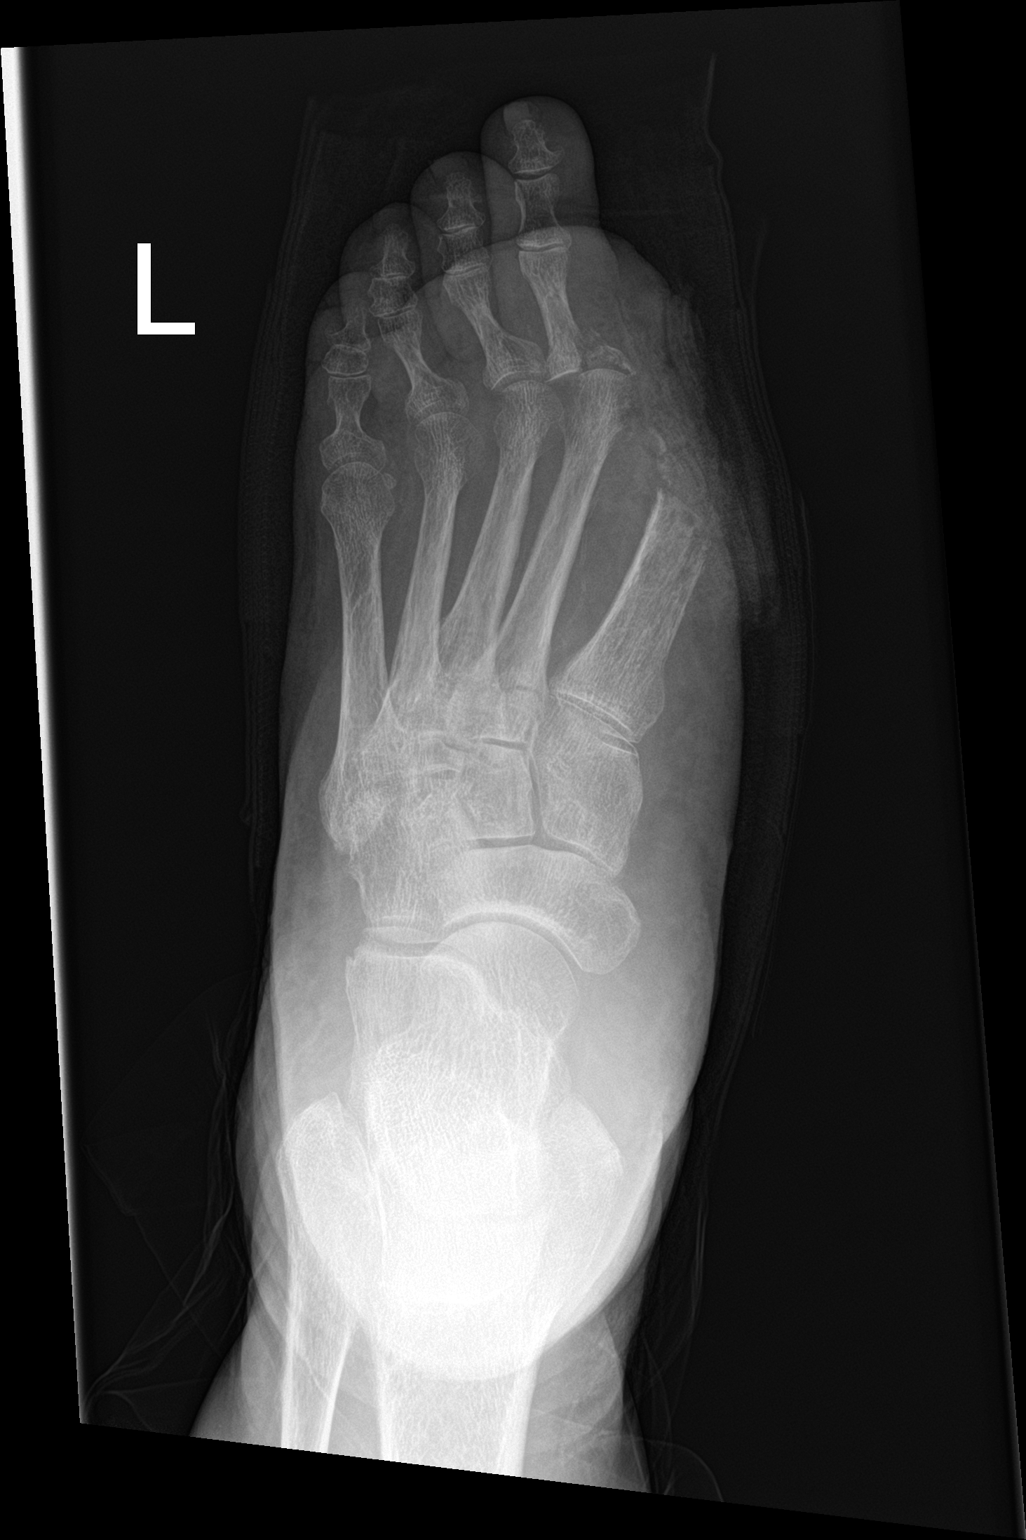

[foot obl]
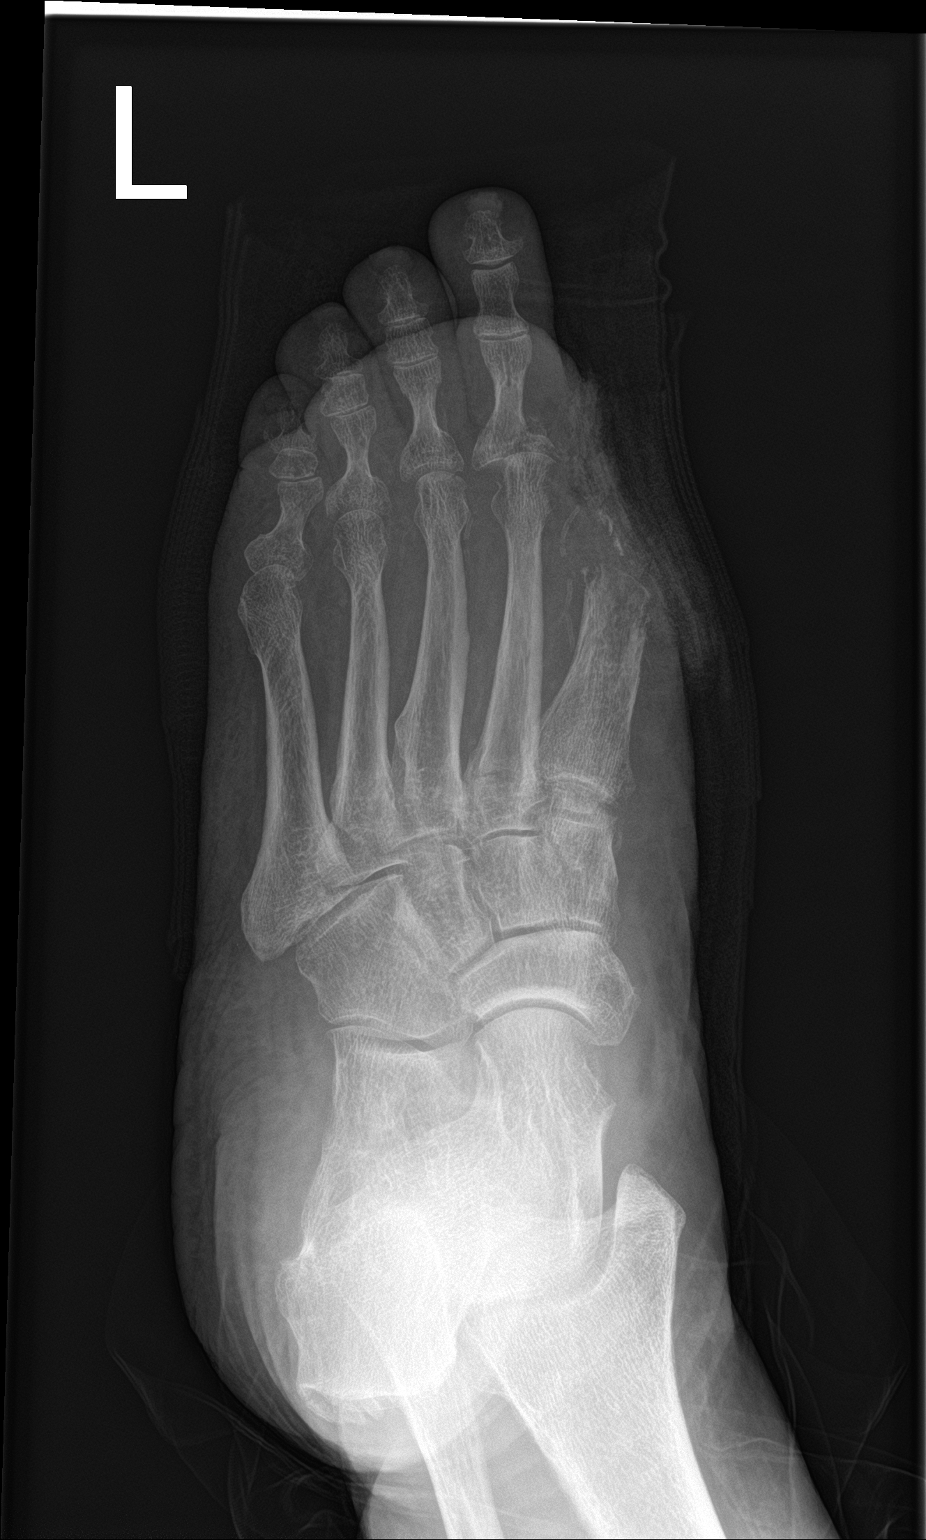

[foot lat]
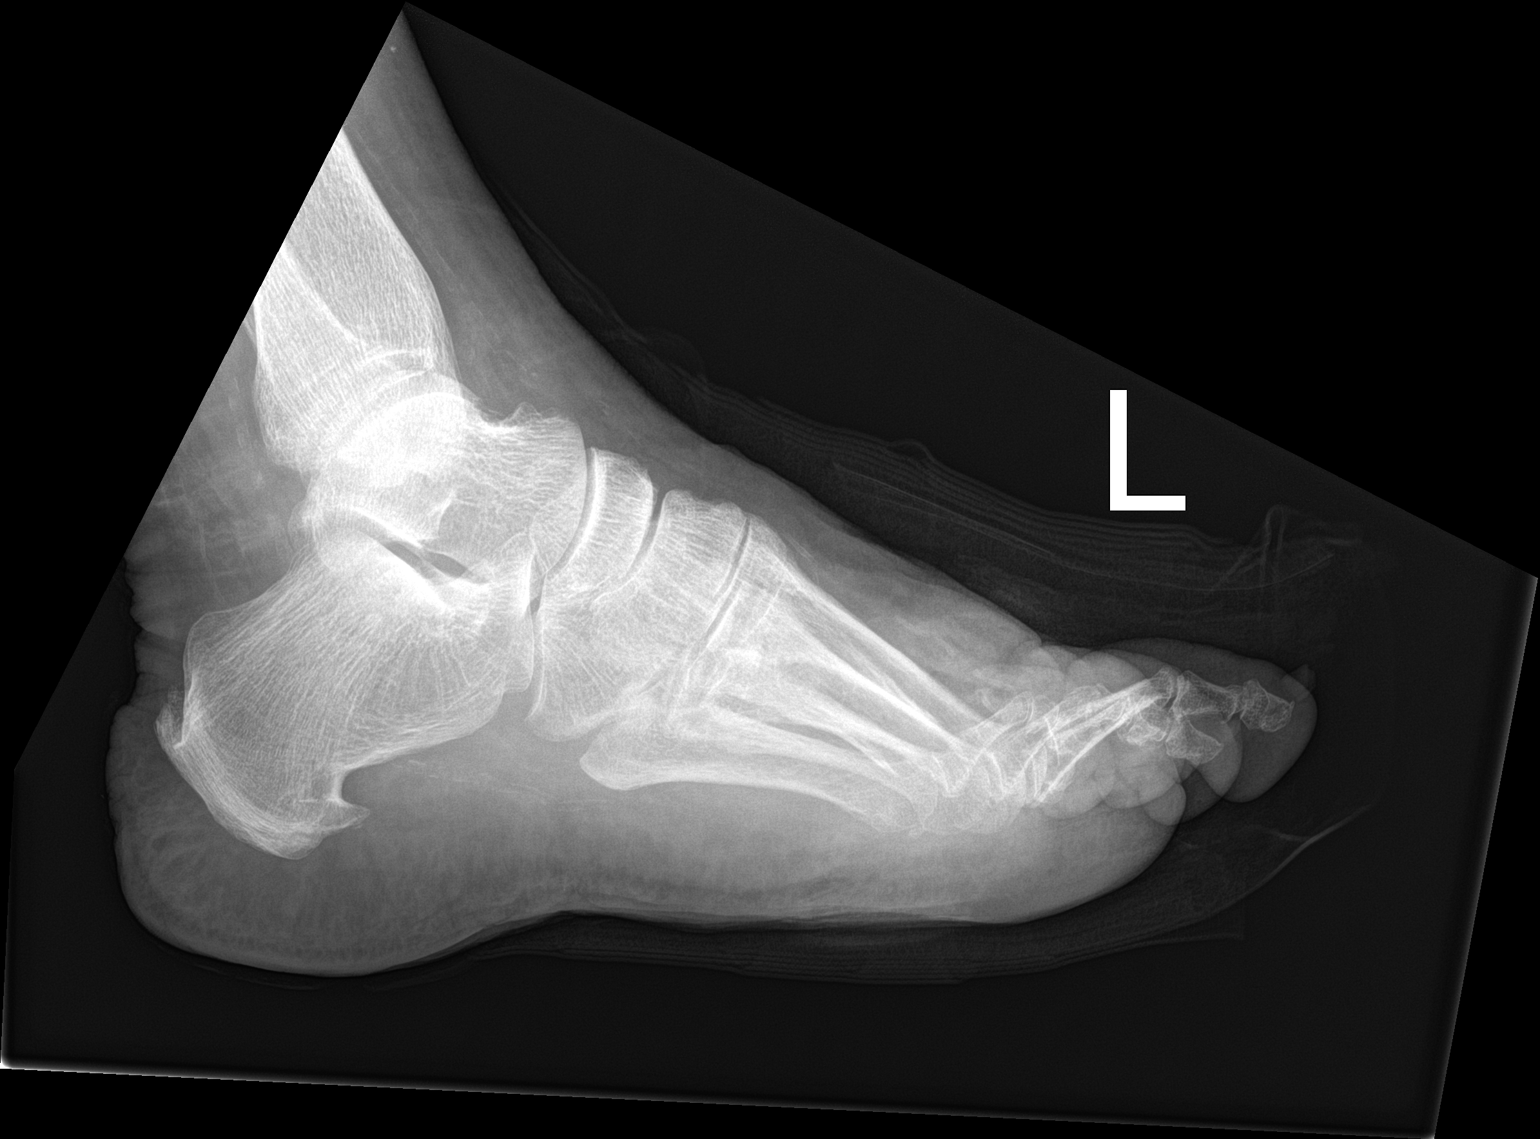

[3 of 3 positions shown; findings below may reference images not displayed]

FINDINGS: Three view radiograph left foot demonstrates surgical changes of a
first ray transmetatarsal amputation. There is a subacute
intra-articular fracture of the base of second proximal phalanx with
lateral dislocation of the a second MTP joint.z no other fracture or
dislocation. Extensive soft tissue swelling involving the left
forefoot. Superior and plantar calcaneal spurs are present. Vascular
calcifications are seen.
IMPRESSION: Intra-articular fracture of the base of the second proximal phalanx
with lateral dislocation of the second MTP joint.

## 2023-10-24 DIAGNOSIS — L89893 Pressure ulcer of other site, stage 3: Secondary | ICD-10-CM | POA: Diagnosis not present

## 2023-10-25 NOTE — Telephone Encounter (Signed)
Patient wanted to know about if pre op has been sent and to know about whether to stop taking Plavix

## 2023-10-25 NOTE — Telephone Encounter (Signed)
I will forward to pre op pool as it looks like it was never sent to pre op pool.

## 2023-10-25 NOTE — Telephone Encounter (Signed)
   Patient Name: Jasmine Richardson  DOB: February 07, 1961 MRN: [id number]  Primary Cardiologist: Norman Herrlich, MD  Chart reviewed as part of pre-operative protocol coverage. Given past medical history and time since last visit, based on ACC/AHA guidelines, Brit Boyan is at acceptable risk for the planned procedure without further cardiovascular testing.   The patient was advised that if she develops new symptoms prior to surgery to contact our office to arrange for a follow-up visit, and she verbalized understanding.  Per protocol patient can hold ASA and Plavix 5 to 7 days prior to procedure and should restart postprocedure when surgically safe and hemostasis is achieved.  I will route this recommendation to the requesting party via Epic fax function and remove from pre-op pool.  Please call with questions.  Napoleon Form, Leodis Rains, NP 10/25/2023, 12:23 PM

## 2023-10-25 NOTE — Progress Notes (Addendum)
Updated date of surgery: 10/31/23  Updated time of arrival:  1030 AM  Patient will be discharged from hospital and monitored at home for 24 hours by:  Patient denies any changes in allergies, medications, medical history since pre op appointment on: 10/09/23  Patient will call Dr. Dulce Sellar to clarify if she is to stop Plavix 12 or 3 days prior to surgery.  Pre op instructions reviewed, follow up questions addressed and patient verbalized understanding at this time.

## 2023-10-31 ENCOUNTER — Ambulatory Visit (HOSPITAL_COMMUNITY): Payer: BC Managed Care – PPO | Admitting: Physician Assistant

## 2023-10-31 ENCOUNTER — Encounter (HOSPITAL_COMMUNITY): Payer: Self-pay | Admitting: Podiatry

## 2023-10-31 ENCOUNTER — Other Ambulatory Visit: Payer: Self-pay

## 2023-10-31 ENCOUNTER — Encounter (HOSPITAL_COMMUNITY)
Admission: RE | Disposition: A | Discharge status: Home / Self Care | Payer: Self-pay | Source: Home / Self Care | Attending: Podiatry

## 2023-10-31 ENCOUNTER — Ambulatory Visit (HOSPITAL_COMMUNITY)
Admission: RE | Admit: 2023-10-31 | Discharge: 2023-10-31 | Disposition: A | Discharge status: Home / Self Care | Payer: BC Managed Care – PPO | Attending: Podiatry | Admitting: Podiatry

## 2023-10-31 DIAGNOSIS — I739 Peripheral vascular disease, unspecified: Secondary | ICD-10-CM | POA: Diagnosis not present

## 2023-10-31 DIAGNOSIS — I110 Hypertensive heart disease with heart failure: Secondary | ICD-10-CM | POA: Diagnosis not present

## 2023-10-31 DIAGNOSIS — Z6834 Body mass index (BMI) 34.0-34.9, adult: Secondary | ICD-10-CM | POA: Insufficient documentation

## 2023-10-31 DIAGNOSIS — Z7902 Long term (current) use of antithrombotics/antiplatelets: Secondary | ICD-10-CM | POA: Diagnosis not present

## 2023-10-31 DIAGNOSIS — M86672 Other chronic osteomyelitis, left ankle and foot: Secondary | ICD-10-CM | POA: Diagnosis not present

## 2023-10-31 DIAGNOSIS — Z95810 Presence of automatic (implantable) cardiac defibrillator: Secondary | ICD-10-CM | POA: Diagnosis not present

## 2023-10-31 DIAGNOSIS — I2510 Atherosclerotic heart disease of native coronary artery without angina pectoris: Secondary | ICD-10-CM | POA: Diagnosis not present

## 2023-10-31 DIAGNOSIS — I5032 Chronic diastolic (congestive) heart failure: Secondary | ICD-10-CM | POA: Insufficient documentation

## 2023-10-31 DIAGNOSIS — J439 Emphysema, unspecified: Secondary | ICD-10-CM | POA: Insufficient documentation

## 2023-10-31 DIAGNOSIS — Z955 Presence of coronary angioplasty implant and graft: Secondary | ICD-10-CM | POA: Diagnosis not present

## 2023-10-31 DIAGNOSIS — L97529 Non-pressure chronic ulcer of other part of left foot with unspecified severity: Secondary | ICD-10-CM | POA: Diagnosis present

## 2023-10-31 DIAGNOSIS — I700 Atherosclerosis of aorta: Secondary | ICD-10-CM | POA: Diagnosis not present

## 2023-10-31 DIAGNOSIS — K219 Gastro-esophageal reflux disease without esophagitis: Secondary | ICD-10-CM | POA: Diagnosis not present

## 2023-10-31 DIAGNOSIS — I252 Old myocardial infarction: Secondary | ICD-10-CM | POA: Diagnosis not present

## 2023-10-31 DIAGNOSIS — Z794 Long term (current) use of insulin: Secondary | ICD-10-CM | POA: Insufficient documentation

## 2023-10-31 DIAGNOSIS — E1151 Type 2 diabetes mellitus with diabetic peripheral angiopathy without gangrene: Secondary | ICD-10-CM | POA: Diagnosis not present

## 2023-10-31 DIAGNOSIS — Z87891 Personal history of nicotine dependence: Secondary | ICD-10-CM | POA: Diagnosis not present

## 2023-10-31 DIAGNOSIS — L97522 Non-pressure chronic ulcer of other part of left foot with fat layer exposed: Secondary | ICD-10-CM | POA: Insufficient documentation

## 2023-10-31 DIAGNOSIS — Z7984 Long term (current) use of oral hypoglycemic drugs: Secondary | ICD-10-CM | POA: Insufficient documentation

## 2023-10-31 DIAGNOSIS — Z951 Presence of aortocoronary bypass graft: Secondary | ICD-10-CM | POA: Insufficient documentation

## 2023-10-31 DIAGNOSIS — I25119 Atherosclerotic heart disease of native coronary artery with unspecified angina pectoris: Secondary | ICD-10-CM | POA: Diagnosis not present

## 2023-10-31 DIAGNOSIS — M86172 Other acute osteomyelitis, left ankle and foot: Secondary | ICD-10-CM | POA: Diagnosis not present

## 2023-10-31 DIAGNOSIS — E11621 Type 2 diabetes mellitus with foot ulcer: Secondary | ICD-10-CM | POA: Insufficient documentation

## 2023-10-31 DIAGNOSIS — L97502 Non-pressure chronic ulcer of other part of unspecified foot with fat layer exposed: Secondary | ICD-10-CM | POA: Diagnosis not present

## 2023-10-31 HISTORY — PX: BONE BIOPSY: SHX375

## 2023-10-31 HISTORY — PX: INCISION AND DRAINAGE OF WOUND: SHX1803

## 2023-10-31 LAB — GLUCOSE, CAPILLARY
Glucose-Capillary: 327 mg/dL — ABNORMAL HIGH (ref 70–99)
Glucose-Capillary: 262 mg/dL — ABNORMAL HIGH (ref 70–99)

## 2023-10-31 SURGERY — BIOPSY, BONE
Anesthesia: General | Laterality: Left

## 2023-10-31 MED ORDER — FENTANYL CITRATE (PF) 100 MCG/2ML IJ SOLN
INTRAMUSCULAR | Status: AC
  Filled 2023-10-31: qty 2

## 2023-10-31 MED ORDER — FENTANYL CITRATE (PF) 100 MCG/2ML IJ SOLN
INTRAMUSCULAR | Status: DC | PRN
  Administered 2023-10-31 (×2): 25 ug via INTRAVENOUS

## 2023-10-31 MED ORDER — MIDAZOLAM HCL 5 MG/5ML IJ SOLN
INTRAMUSCULAR | Status: DC | PRN
  Administered 2023-10-31: 2 mg via INTRAVENOUS

## 2023-10-31 MED ORDER — MIDAZOLAM HCL 2 MG/2ML IJ SOLN
INTRAMUSCULAR | Status: AC
  Filled 2023-10-31: qty 2

## 2023-10-31 MED ORDER — INSULIN ASPART 100 UNIT/ML IJ SOLN
INTRAMUSCULAR | Status: AC
  Filled 2023-10-31: qty 1

## 2023-10-31 MED ORDER — ACETAMINOPHEN 500 MG PO TABS
1000.0000 mg | ORAL_TABLET | Freq: Once | ORAL | Status: AC
  Administered 2023-10-31: 1000 mg via ORAL
  Filled 2023-10-31: qty 2

## 2023-10-31 MED ORDER — INSULIN ASPART 100 UNIT/ML IJ SOLN
0.0000 [IU] | INTRAMUSCULAR | Status: DC | PRN
Start: 2023-10-31 — End: 2023-10-31
  Administered 2023-10-31: 10 [IU] via SUBCUTANEOUS

## 2023-10-31 MED ORDER — PROPOFOL 500 MG/50ML IV EMUL
INTRAVENOUS | Status: DC | PRN
  Administered 2023-10-31: 150 ug/kg/min via INTRAVENOUS

## 2023-10-31 MED ORDER — POVIDONE-IODINE 7.5 % EX SOLN: Freq: Once | CUTANEOUS | Status: DC

## 2023-10-31 MED ORDER — BUPIVACAINE HCL (PF) 0.5 % IJ SOLN
INTRAMUSCULAR | Status: AC
  Filled 2023-10-31: qty 30

## 2023-10-31 MED ORDER — FENTANYL CITRATE PF 50 MCG/ML IJ SOSY: 25.0000 ug | PREFILLED_SYRINGE | INTRAMUSCULAR | Status: DC | PRN

## 2023-10-31 MED ORDER — PHENYLEPHRINE HCL (PRESSORS) 10 MG/ML IV SOLN
INTRAVENOUS | Status: DC | PRN
  Administered 2023-10-31 (×2): 160 ug via INTRAVENOUS
  Administered 2023-10-31: 80 ug via INTRAVENOUS

## 2023-10-31 MED ORDER — LIDOCAINE HCL 2 % IJ SOLN
INTRAMUSCULAR | Status: AC
  Filled 2023-10-31: qty 20

## 2023-10-31 MED ORDER — SODIUM CHLORIDE 0.9 % IR SOLN
Status: DC | PRN
  Administered 2023-10-31: 1000 mL

## 2023-10-31 MED ORDER — LACTATED RINGERS IV SOLN: INTRAVENOUS | Status: DC | PRN

## 2023-10-31 SURGICAL SUPPLY — 63 items
BAG COUNTER SPONGE SURGICOUNT (BAG) ×1 IMPLANT
BLADE SURG 15 STRL LF DISP TIS (BLADE) ×1 IMPLANT
BLADE SURG 15 STRL SS (BLADE) ×1
BNDG COHESIVE 4X5 TAN STRL LF (GAUZE/BANDAGES/DRESSINGS) ×1 IMPLANT
BNDG ELASTIC 3INX 5YD STR LF (GAUZE/BANDAGES/DRESSINGS) ×1 IMPLANT
BNDG ELASTIC 4INX 5YD STR LF (GAUZE/BANDAGES/DRESSINGS) ×1 IMPLANT
BNDG ESMARK 4X9 LF (GAUZE/BANDAGES/DRESSINGS) IMPLANT
BNDG GAUZE DERMACEA FLUFF 4 (GAUZE/BANDAGES/DRESSINGS) ×1 IMPLANT
CHLORAPREP W/TINT 26 (MISCELLANEOUS) IMPLANT
CNTNR URN SCR LID CUP LEK RST (MISCELLANEOUS) IMPLANT
CONT SPEC 4OZ STRL OR WHT (MISCELLANEOUS)
COVER BACK TABLE 60X90IN (DRAPES) ×1 IMPLANT
COVER MAYO STAND STRL (DRAPES) ×1 IMPLANT
CUFF TOURN SGL QUICK 18X4 (TOURNIQUET CUFF) IMPLANT
CUFF TOURN SGL QUICK 24 (TOURNIQUET CUFF)
CUFF TRNQT CYL 24X4X16.5-23 (TOURNIQUET CUFF) IMPLANT
DRAPE 3/4 80X56 (DRAPES) ×1 IMPLANT
DRAPE EXTREMITY T 121X128X90 (DISPOSABLE) ×1 IMPLANT
DRAPE SHEET LG 3/4 BI-LAMINATE (DRAPES) ×1 IMPLANT
DRAPE U-SHAPE 47X51 STRL (DRAPES) ×1 IMPLANT
DURAPREP 26ML APPLICATOR (WOUND CARE) ×1 IMPLANT
ELECT REM PT RETURN 15FT ADLT (MISCELLANEOUS) ×1 IMPLANT
GAUZE SPONGE 4X4 12PLY STRL (GAUZE/BANDAGES/DRESSINGS) ×1 IMPLANT
GAUZE XEROFORM 1X8 LF (GAUZE/BANDAGES/DRESSINGS) ×1 IMPLANT
GLOVE BIO SURGEON STRL SZ7.5 (GLOVE) ×1 IMPLANT
GLOVE BIO SURGEON STRL SZ8 (GLOVE) ×1 IMPLANT
GLOVE BIOGEL PI IND STRL 8 (GLOVE) ×1 IMPLANT
GOWN STRL REUS W/ TWL XL LVL3 (GOWN DISPOSABLE) ×1 IMPLANT
GOWN STRL REUS W/TWL XL LVL3 (GOWN DISPOSABLE) ×1
KIT BASIN OR (CUSTOM PROCEDURE TRAY) ×1 IMPLANT
KIT TURNOVER KIT A (KITS) IMPLANT
MANIFOLD NEPTUNE II (INSTRUMENTS) ×1 IMPLANT
NDL BIOPSY JAMSHIDI 11X6 (NEEDLE) ×1 IMPLANT
NDL FILTER BLUNT 18X1 1/2 (NEEDLE) ×1 IMPLANT
NDL HYPO 22X1.5 SAFETY MO (MISCELLANEOUS) IMPLANT
NDL HYPO 25X1 1.5 SAFETY (NEEDLE) ×1 IMPLANT
NEEDLE BIOPSY JAMSHIDI 11X6 (NEEDLE) ×2
NEEDLE FILTER BLUNT 18X1 1/2 (NEEDLE) ×1
NEEDLE HYPO 22X1.5 SAFETY MO (MISCELLANEOUS)
NEEDLE HYPO 25X1 1.5 SAFETY (NEEDLE) ×1
NS IRRIG 1000ML POUR BTL (IV SOLUTION) IMPLANT
PACK ORTHO EXTREMITY (CUSTOM PROCEDURE TRAY) ×1 IMPLANT
PADDING CAST ABS COTTON 4X4 ST (CAST SUPPLIES) ×1 IMPLANT
SET IRRIG Y TYPE TUR BLADDER L (SET/KITS/TRAYS/PACK) IMPLANT
SPONGE T-LAP 4X18 ~~LOC~~+RFID (SPONGE) ×1 IMPLANT
STAPLER SKIN PROX WIDE 3.9 (STAPLE) IMPLANT
STOCKINETTE 4X48 STRL (DRAPES) IMPLANT
STOCKINETTE 6 STRL (DRAPES) ×1 IMPLANT
SUCTION TUBE FRAZIER 10FR DISP (SUCTIONS) IMPLANT
SUT ETHILON 3 0 PS 1 (SUTURE) IMPLANT
SUT ETHILON 4 0 PS 2 18 (SUTURE) IMPLANT
SUT MNCRL AB 3-0 PS2 18 (SUTURE) IMPLANT
SUT MNCRL AB 4-0 PS2 18 (SUTURE) IMPLANT
SUT PROLENE 4 0 PS 2 18 (SUTURE) ×1 IMPLANT
SUT VIC AB 2-0 SH 27 (SUTURE)
SUT VIC AB 2-0 SH 27XBRD (SUTURE) IMPLANT
SWAB CULTURE ESWAB REG 1ML (MISCELLANEOUS) IMPLANT
SYR BULB EAR ULCER 3OZ GRN STR (SYRINGE) ×1 IMPLANT
SYR CONTROL 10ML LL (SYRINGE) ×2 IMPLANT
TUBE IRRIGATION SET MISONIX (TUBING) IMPLANT
TUBING CONNECTING 10 (TUBING) IMPLANT
UNDERPAD 30X36 HEAVY ABSORB (UNDERPADS AND DIAPERS) ×1 IMPLANT
YANKAUER SUCT BULB TIP NO VENT (SUCTIONS) ×1 IMPLANT

## 2023-10-31 NOTE — Interval H&P Note (Signed)
History and Physical Interval Note:  10/31/2023 12:54 PM  Jasmine Richardson  has presented today for surgery, with the diagnosis of ulcer of left foot with fat layer exposed.  The various methods of treatment have been discussed with the patient and family. After consideration of risks, benefits and other options for treatment, the patient has consented to  Procedure(s): BONE BIOPSY (Left) IRRIGATION AND DEBRIDEMENT WOUND (Left) as a surgical intervention.  The patient's history has been reviewed, patient examined, no change in status, stable for surgery.  I have reviewed the patient's chart and labs.  Questions were answered to the patient's satisfaction.     Felecia Shelling

## 2023-10-31 NOTE — Transfer of Care (Signed)
Immediate Anesthesia Transfer of Care Note  Patient: Jasmine Richardson  Procedure(s) Performed: BONE BIOPSY (Left) IRRIGATION AND DEBRIDEMENT WOUND (Left)  Patient Location: PACU  Anesthesia Type:MAC  Level of Consciousness: sedated  Airway & Oxygen Therapy: Patient Spontanous Breathing and Patient connected to face mask  Post-op Assessment: Report given to RN  Post vital signs: Reviewed and stable  Last Vitals:  Vitals Value Taken Time  BP 108/64 10/31/23 1345  Temp    Pulse 65 10/31/23 1350  Resp 0 10/31/23 1350  SpO2 99 % 10/31/23 1350  Vitals shown include unfiled device data.  Last Pain:  Vitals:   10/31/23 1031  TempSrc:   PainSc: 0-No pain         Complications: No notable events documented.

## 2023-10-31 NOTE — Op Note (Signed)
   OPERATIVE REPORT Patient name: Jasmine Richardson MRN: [id number] DOB: [birth year]  DOS: 10/31/23  Preop Dx: Reactive osteitis versus osteomyelitis fifth metatarsal left.  Ulcer left foot Postop Dx: same  Procedure:  1.  Bone biopsy fifth metatarsal left foot 2.  Excisional debridement of ulcer left foot  Surgeon: Felecia Shelling DPM  Anesthesia: Monitored anesthesia care with local infiltration consisting of 10 cc of 1% lidocaine plain  Hemostasis: Calf tourniquet inflated to a pressure of after esmarch exsanguination   EBL: Minimal mL Materials: None Injectables: None Pathology: Fifth metatarsal sent for BOTH pathology and culture  Condition: The patient tolerated the procedure and anesthesia well. No complications noted or reported   Justification for procedure: The patient is a 62 y.o. female PMHx T2DM who presents today for surgical correction of chronic ulcer to the left foot amputation stump with questionable osteomyelitis to the fifth metatarsal.. All conservative modalities of been unsuccessful in providing any sort of satisfactory alleviation of symptoms with the patient. The patient was told benefits as well as possible side effects of the surgery. The patient consented for surgical correction. The patient consent form was reviewed. All patient questions were answered. No guarantees were expressed or implied. The patient and the surgeon both signed the patient consent form with the witness present and placed in the patient's chart.   Procedure in Detail: The patient was brought to the operating room, remaining in the stretcher in the supine position at which time an aseptic scrub and drape were performed about the patient's respective lower extremity after anesthesia was induced as described above. Attention was then directed to the surgical area where procedure number one commenced.  Procedure #1: Bone biopsy fifth metatarsal left foot A Jamshidi core bone biopsy  trephine was utilized and a percutaneous entry was performed at the dorsal distal aspect of the remaining fifth metatarsal of the left foot.  Guided by intraoperative x-ray fluoroscopy.  2 separate biopsies were harvested and placed in separate containers.  1 sent for pathology and the other for culture.  Procedure 2: Excisional debridement of ulcer left Excisional debridement including muscle and deep fascial tissue was performed using #15 scalpel.  The wound measures approximately 1.0 x 1.0 x 0.5 cm.  Excisional debridement of the necrotic nonviable tissue down to healthier bleeding viable tissue was performed with postoperative measurement same as pre-.  Granular tissue noted.  No exposed bone.  Margins appear healthy and viable  Dry sterile compressive dressings were then applied to all previously mentioned incision sites about the patient's lower extremity. The tourniquet which was used for hemostasis was deflated. All normal neurovascular responses including pink color and warmth returned to the patient's lower extremity.  The patient was then transferred from the operating room to the recovery room having tolerated the procedure and anesthesia well. All vital signs are stable. After a brief stay in the recovery room the patient was discharged with postoperative orders placed.     Felecia Shelling, DPM [facility]  Dr. Felecia Shelling, DPM    [address] [place], [place] [address]                Office [phone]  Fax [phone]

## 2023-10-31 NOTE — Anesthesia Preprocedure Evaluation (Addendum)
Anesthesia Evaluation  Patient identified by MRN, date of birth, ID band Patient awake    Reviewed: Allergy & Precautions, NPO status , Patient's Chart, lab work & pertinent test results, reviewed documented beta blocker date and time   Airway Mallampati: I  TM Distance: >3 FB Neck ROM: Full    Dental  (+) Edentulous Upper, Edentulous Lower, Dental Advisory Given   Pulmonary COPD, former smoker   Pulmonary exam normal breath sounds clear to auscultation       Cardiovascular hypertension, Pt. on home beta blockers and Pt. on medications + angina  + CAD, + Past MI, + Cardiac Stents, + CABG and + Peripheral Vascular Disease  Normal cardiovascular exam+ dysrhythmias Ventricular Tachycardia + Cardiac Defibrillator  Rhythm:Regular Rate:Normal  TTE 2024  1. Left ventricular ejection fraction, by estimation, is 60 to 65%. The  left ventricle has normal function. The left ventricle has no regional  wall motion abnormalities. There is mild left ventricular hypertrophy.  Left ventricular diastolic function  could not be evaluated.   2. Right ventricular systolic function is normal. The right ventricular  size is normal.   3. Left atrial size was mildly dilated.   4. The mitral valve is normal in structure. No evidence of mitral valve  regurgitation. No evidence of mitral stenosis.   5. The aortic valve is calcified. There is mild calcification of the  aortic valve. There is mild thickening of the aortic valve. Aortic valve  regurgitation is not visualized. No aortic stenosis is present.   6. The inferior vena cava is normal in size with greater than 50%  respiratory variability, suggesting right atrial pressure of 3 mmHg.   Cath 2022 1. Severe native coronary artery disease, as detailed below, not significantly changed from prior catheterization in 12/2020. 2. Widely patent LIMA-LAD graft and LMCA stent. 3. Patent mid RCA stents with  moderate restenosis (~40%). 4. Moderately reduced left ventricular systolic function (LVEF 40-45%). 5. Upper normal left ventricular filling pressure (LVEDP 15 mmHg).   Recommendations: 1. Escalate antianginal therapy; no focal targets again noted on today's catheterization. 2. Aggressive secondary prevention.     Neuro/Psych   Anxiety Depression    negative neurological ROS  negative psych ROS   GI/Hepatic Neg liver ROS, hiatal hernia,GERD  ,,  Endo/Other  diabetes, Poorly Controlled, Insulin Dependent, Oral Hypoglycemic Agents    Renal/GU negative Renal ROS  negative genitourinary   Musculoskeletal negative musculoskeletal ROS (+)    Abdominal   Peds  Hematology  (+) Blood dyscrasia (plavix)   Anesthesia Other Findings   Reproductive/Obstetrics                             Anesthesia Physical Anesthesia Plan  ASA: 3  Anesthesia Plan: MAC   Post-op Pain Management: Tylenol PO (pre-op)*   Induction: Intravenous  PONV Risk Score and Plan: 2 and Ondansetron and Midazolam  Airway Management Planned: Simple Face Mask and Natural Airway  Additional Equipment:   Intra-op Plan:   Post-operative Plan:   Informed Consent: I have reviewed the patients History and Physical, chart, labs and discussed the procedure including the risks, benefits and alternatives for the proposed anesthesia with the patient or authorized representative who has indicated his/her understanding and acceptance.     Dental advisory given  Plan Discussed with: CRNA  Anesthesia Plan Comments:        Anesthesia Quick Evaluation

## 2023-10-31 NOTE — Brief Op Note (Signed)
10/31/2023  1:32 PM  PATIENT:  Jasmine Richardson  62 y.o. female  PRE-OPERATIVE DIAGNOSIS:  ulcer of left foot with fat layer exposed  POST-OPERATIVE DIAGNOSIS:  ulcer of left foot with fat layer exposed  PROCEDURE:  Procedure(s): BONE BIOPSY (Left) IRRIGATION AND DEBRIDEMENT WOUND (Left)  SURGEON:  Surgeons and Role:    Felecia Shelling, DPM - Primary  PHYSICIAN ASSISTANT: none   ASSISTANTS: none   ANESTHESIA:   local and IV sedation  EBL:  minimal   BLOOD ADMINISTERED:none  DRAINS: none   LOCAL MEDICATIONS USED:  LIDOCAINE  and Amount: 10 ml  SPECIMEN:  Source of Specimen:  5th metatarsal sent for BOTH culture and pathology  DISPOSITION OF SPECIMEN:  PATHOLOGY  COUNTS:  YES  TOURNIQUET:   Total Tourniquet Time Documented: Calf (Left) - 16 minutes Total: Calf (Left) - 16 minutes   DICTATION: .Reubin Milan Dictation  PLAN OF CARE: Discharge to home after PACU  PATIENT DISPOSITION:  PACU - hemodynamically stable.   Delay start of Pharmacological VTE agent (>24hrs) due to surgical blood loss or risk of bleeding: no  Felecia Shelling, DPM [facility]  Dr. Felecia Shelling, DPM    [address] [place], [place] [address]                Office [phone]  Fax [phone]

## 2023-10-31 NOTE — Interval H&P Note (Signed)
Anesthesia H&P Update: History and Physical Exam reviewed; patient is OK for planned anesthetic and procedure. ? ?

## 2023-11-01 ENCOUNTER — Encounter (HOSPITAL_COMMUNITY): Payer: Self-pay | Admitting: Podiatry

## 2023-11-01 NOTE — Anesthesia Postprocedure Evaluation (Signed)
Anesthesia Post Note  Patient: Jasmine Richardson  Procedure(s) Performed: BONE BIOPSY (Left) IRRIGATION AND DEBRIDEMENT WOUND (Left)     Patient location during evaluation: PACU Anesthesia Type: MAC Level of consciousness: awake and alert Pain management: pain level controlled Vital Signs Assessment: post-procedure vital signs reviewed and stable Respiratory status: spontaneous breathing, nonlabored ventilation, respiratory function stable and patient connected to nasal cannula oxygen Cardiovascular status: blood pressure returned to baseline and stable Postop Assessment: no apparent nausea or vomiting Anesthetic complications: no  No notable events documented.  Last Vitals:  Vitals:   10/31/23 1430 10/31/23 1445  BP: 129/78 (!) 156/83  Pulse: 68 68  Resp: (!) 9   Temp:  36.6 C  SpO2: 96% 94%    Last Pain:  Vitals:   10/31/23 1445  TempSrc:   PainSc: 0-No pain                 Lenzie Montesano L Chaunda Vandergriff

## 2023-11-04 LAB — SURGICAL PATHOLOGY

## 2023-11-05 LAB — AEROBIC/ANAEROBIC CULTURE W GRAM STAIN (SURGICAL/DEEP WOUND)
Culture: NO GROWTH
Gram Stain: NONE SEEN

## 2023-11-06 ENCOUNTER — Ambulatory Visit (INDEPENDENT_AMBULATORY_CARE_PROVIDER_SITE_OTHER): Payer: BC Managed Care – PPO

## 2023-11-06 ENCOUNTER — Encounter: Payer: BC Managed Care – PPO | Admitting: Podiatry

## 2023-11-06 ENCOUNTER — Ambulatory Visit (INDEPENDENT_AMBULATORY_CARE_PROVIDER_SITE_OTHER): Payer: BC Managed Care – PPO | Admitting: Podiatry

## 2023-11-06 DIAGNOSIS — E0843 Diabetes mellitus due to underlying condition with diabetic autonomic (poly)neuropathy: Secondary | ICD-10-CM

## 2023-11-06 DIAGNOSIS — L97522 Non-pressure chronic ulcer of other part of left foot with fat layer exposed: Secondary | ICD-10-CM

## 2023-11-06 NOTE — Progress Notes (Signed)
Chief Complaint  Patient presents with   Routine Post Op    POV #1 DOS 10/31/2023 POV # 1 DOS 10/10/23 --- Debridement of ulcer left, Bone biopsy left fifth metatarsal    Subjective:  62 y.o. female with PMHx of diabetes mellitus PSxHx TMA LT foot s/p bone biopsy of the left foot.  DOS: 10/31/2023.  Patient doing well.  No new complaints  Past Medical History:  Diagnosis Date   Abnormal EKG 10/23/2021   Abnormal mammogram of left breast 09/04/2021   Abnormal stress test 10/23/2021   Acute chest pain 05/08/2016   AICD (automatic cardioverter/defibrillator) present    St Jude/Abbott device   AKI (acute kidney injury) (HCC) 10/23/2021   Angina pectoris (HCC) 09/11/2017   Anxiety 10/23/2021   Bacteremia 11/22/2021   Breast wound, right, subsequent encounter 09/04/2021   Burping 05/08/2016   Cancer (HCC)    cervical - hysterectomy   Cellulitis of right breast 10/23/2021   Cellulitis, wound, post-operative 10/09/2021   Chronic diastolic heart failure (HCC) 11/06/2016   Coronary artery disease involving native coronary artery of native heart with angina pectoris (HCC) 09/12/2015   Overview:  PCI and stent of RCA 2009, last cath 2012 with medical therapy  CABG May 2017   Demand ischemia [facility])    Diabetic foot infection (HCC) 11/21/2021   Diabetic foot ulcer (HCC) 02/16/2020   Emphysema lung (HCC) 09/11/2017   patient not aware of this dx   Essential hypertension 09/12/2015   Gangrene (HCC) 10/23/2021   GERD (gastroesophageal reflux disease) 05/08/2016   Hematoma 10/23/2021   Hiatal hernia 10/23/2021   Hyperglycemia 10/23/2021   Hyperglycemia due to type 2 diabetes mellitus (HCC) 10/23/2021   Hyperlipidemia 09/12/2015   Hyponatremia 10/23/2021   ICD (implantable cardioverter-defibrillator) in place 05/16/2021   Infection of great toe 10/23/2021   Lactic acidosis 10/23/2021   Leukocytosis 10/23/2021   Mediastinitis 06/28/2016   Morbid obesity (HCC) 05/08/2016   Myocardial  infarction [facility])    NSTEMI (non-ST elevated myocardial infarction) (HCC) 05/22/2020   Obesity (BMI 30-39.9) 10/23/2021   Perineal abscess 10/23/2021   Peripheral vascular disease (HCC)    S/P CABG (coronary artery bypass graft) 06/03/2016   Overview:  The patient underwent sternal reconstruction on 06/21/16 with pec flaps for mediastinitis from a prior CABG in May 2017. On admission, she was critically ill from sepsis and had altered mental status. She was last seen in clinic on 08/09/16 at which time she was doing well.   Severe sepsis (HCC) 06/03/2016   Sinus tachycardia 05/08/2016   Tobacco use disorder 04/20/2016   Overview:  Quit in May 2017.   Type 2 diabetes mellitus with foot ulcer (HCC) 05/08/2016   Unstable angina (HCC) 05/22/2020   Ventricular tachycardia (HCC) 12/17/2020   Wound, surgical, infected 06/06/2016   Overview:  sternal    Past Surgical History:  Procedure Laterality Date   ABDOMINAL AORTOGRAM W/LOWER EXTREMITY N/A 10/11/2021   Procedure: ABDOMINAL AORTOGRAM W/LOWER EXTREMITY;  Surgeon: Cephus Shelling, MD;  Location: [facility] INVASIVE [facility] LAB;  Service: Cardiovascular;  Laterality: N/A;   AMPUTATION TOE Right 02/20/2020   Procedure: AMPUTATION RIGHT GREAT  TOE;  Surgeon: Candelaria Stagers, DPM;  Location: [facility] OR;  Service: Podiatry;  Laterality: Right;   BLADDER SURGERY     BONE BIOPSY Left 02/01/2022   Procedure: BONE BIOPSY LEFT FOOT, IRRIGATION AND DEBRIDEMENT;  Surgeon: Asencion Islam, DPM;  Location: [facility] OR;  Service: Podiatry;  Laterality: Left;   BONE BIOPSY Left  10/31/2023   Procedure: BONE BIOPSY;  Surgeon: Felecia Shelling, DPM;  Location: [facility] ORS;  Service: Orthopedics/Podiatry;  Laterality: Left;   BUBBLE STUDY  11/27/2021   Procedure: BUBBLE STUDY;  Surgeon: Pricilla Riffle, MD;  Location: [facility] ENDOSCOPY;  Service: Cardiovascular;;   CARDIAC CATHETERIZATION     CORONARY ARTERY BYPASS GRAFT     CORONARY STENT INTERVENTION N/A 05/25/2020   Procedure: CORONARY STENT  INTERVENTION;  Surgeon: Iran Ouch, MD;  Location: [facility];  Service: Cardiovascular;  Laterality: N/A;   CORONARY STENT INTERVENTION N/A 06/22/2020   Procedure: CORONARY STENT INTERVENTION;  Surgeon: Marykay Lex, MD;  Location[facility]VASIVE CV LAB;  Service: Cardiovascular;  Laterality: N/A;   CORONARY ULTRASOUND/IVUS N/A 06/22/2020   Procedure: Intravascular Ultrasound/IVUS;  Surgeon: Marykay Lex, MD;  Location: [facility] INVASIVE CV LAB;  Service: Cardiovascular;  Laterality: N/A;   ICD IMPLANT N/A 12/19/2020   Procedure: ICD IMPLANT;  Surgeon: Marinus Maw, MD;  Location: [facility] INVASIVE CV LAB;  Service: Cardiovascular;  Laterality: N/A;   INCISION AND DRAINAGE Right 02/19/2020   Procedure: INCISION AND DRAINAGE;  Surgeon: Vivi Barrack, DPM;  Location: [facility] OR;  Service: Podiatry;  Laterality: Right;  Block done by surgeon   INCISION AND DRAINAGE OF WOUND Left 10/31/2023   Procedure: IRRIGATION AND DEBRIDEMENT WOUND;  Surgeon: Felecia Shelling, DPM;  Location: [facility] ORS;  Service: Orthopedics/Podiatry;  Laterality: Left;   IRRIGATION AND DEBRIDEMENT FOOT Left 10/10/2021   Procedure: IRRIGATION AND DEBRIDEMENT FOOT;  Surgeon: Asencion Islam, DPM;  Location: [facility] OR;  Service: Podiatry;  Laterality: Left;   IRRIGATION AND DEBRIDEMENT FOOT Left 11/23/2021   Procedure: IRRIGATION AND DEBRIDEMENT FOOT;  Surgeon: Louann Sjogren, MD;  Location: [facility] OR;  Service: Podiatry;  Laterality: Left;  will do local block   LEFT HEART CATH AND CORS/GRAFTS ANGIOGRAPHY N/A 05/23/2020   Procedure: LEFT HEART CATH AND CORS/GRAFTS ANGIOGRAPHY;  Surgeon: Yvonne Kendall, MD;  Location: [facility];  Service: Cardiovascular;  Laterality: N/A;   LEFT HEART CATH AND CORS/GRAFTS ANGIOGRAPHY N/A 12/19/2020   Procedure: LEFT HEART CATH AND CORS/GRAFTS ANGIOGRAPHY;  Surgeon: Kathleene Hazel, MD;  Location: [facility];  Service: Cardiovascular;  Laterality: N/A;   LEFT HEART CATH AND CORS/GRAFTS  ANGIOGRAPHY N/A 07/21/2021   Procedure: LEFT HEART CATH AND CORS/GRAFTS ANGIOGRAPHY;  Surgeon: Yvonne Kendall, MD;  Location: [facility];  Service: Cardiovascular;  Laterality: N/A;   METATARSAL HEAD EXCISION Right 05/02/2020   Procedure: FIRST METATARSAL HEAD RESECTION; RIGHT FOOT WOUND CLOSURE;  Surgeon: Asencion Islam, DPM;  Location: [facility];  Service: Podiatry;  Laterality: Right;  MAC W/LOCAL   PERIPHERAL VASCULAR BALLOON ANGIOPLASTY Left 10/11/2021   Procedure: PERIPHERAL VASCULAR BALLOON ANGIOPLASTY;  Surgeon: Cephus Shelling, MD;  Location: [facility];  Service: Cardiovascular;  Laterality: Left;  Anterior Tibial Artery   TEE WITHOUT CARDIOVERSION N/A 11/27/2021   Procedure: TRANSESOPHAGEAL ECHOCARDIOGRAM (TEE);  Surgeon: Pricilla Riffle, MD;  Location: [facility] ENDOSCOPY;  Service: Cardiovascular;  Laterality: N/A;   TRANSMETATARSAL AMPUTATION Left 11/23/2021   Procedure: TRANSMETATARSAL AMPUTATION;  Surgeon: Louann Sjogren, MD;  Location: [facility] OR;  Service: Podiatry;  Laterality: Left;   TUBAL LIGATION      Allergies  Allergen Reactions   Vancomycin Anaphylaxis   Chlorhexidine Gluconate Itching    Received CHG bath, began itching, required benadryl   Cat Hair Extract Other (See Comments)    Sneezing, watery eyes.  Tramadol Other (See Comments)    Hallucinations   Codeine Rash    LT foot 10/24/2022  LT foot 10/24/2022   LT foot 08/13/2023   LT foot 09/23/2023   LT foot 11/06/2023   Objective/Physical Exam General: The patient is alert and oriented x3 in no acute distress.  Dermatology:  The wound continues to improve.  Currently measures approximately 0.5-0 0.5 to 0.2 cm.  Granular wound base.  No drainage.  No exposed bone muscle tendon ligament or joint.  Good potential for healing and good improvement  Vascular: Known history of peripheral vascular disease.  No edema or erythema noted.   VAS [place] ABI WITH/WO TBI 05/08/2022 ABI  Findings:  +--------+------------------+-----+--------+--------+  Right  Rt Pressure (mmHg)IndexWaveformComment   +--------+------------------+-----+--------+--------+  [id number]                                     +--------+------------------+-----+--------+--------+  PTA    125               0.82 biphasic          +--------+------------------+-----+--------+--------+  DP     145               0.95 biphasic          +--------+------------------+-----+--------+--------+   +--------+------------------+-----+--------+-------+  Left   Lt Pressure (mmHg)IndexWaveformComment  +--------+------------------+-----+--------+-------+  [id number]                                    +--------+------------------+-----+--------+-------+  ATA                           absent           +--------+------------------+-----+--------+-------+  PTA    0                 0.00 absent           +--------+------------------+-----+--------+-------+  DP     180               1.18 biphasic         +--------+------------------+-----+--------+-------+   +-------+-----------+-----------+------------+------------+  ABI/TBIToday's ABIToday's TBIPrevious ABIPrevious TBI  +-------+-----------+-----------+------------+------------+  Right 0.95       Amputation 0.80        Amputation    +-------+-----------+-----------+------------+------------+  Left  1.18       Amputation 1.07        Amputation    +-------+-----------+-----------+------------+------------+  Right ABIs appear increased compared to prior study on 11/14/2021.  Compared to prior study on 11/14/2021.    Summary:  Right: Resting right ankle-brachial index is within normal range. No  evidence of significant right lower extremity arterial disease.   Left: Resting left ankle-brachial index is within normal range. No  evidence of significant left lower extremity arterial disease.    Neurological: Light touch and protective threshold diminished bilaterally.   Musculoskeletal Exam: Unchanged.  PSxHx transmetatarsal amputation LT; healed.  Patient also has a very prominent forefoot varus deformity which increases lateral column loading likely secondary to loss of the peroneus brevis tendon at the fifth metatarsal tubercle at the time of amputation.  This is likely the reason for the ulcer to the lateral aspect of the foot.  The forefoot is very reducible/flexible. Taught tibialis anterior tendon noted  Radiographic exam LT foot  09/26/2022: Clean osteotomy sites.  No cortical erosions, fractures noted.  Radiographically no evidence of osteomyelitis  Radiographic exam LT foot 08/12/2023: Osseous irregularity noted to the remaining portion of the fifth metatarsal of the left foot.  Consistent with possible osteomyelitis versus stress reaction secondary to weightbearing.  No obvious fractures identified.  MR FOOT LEFT WO CONTRAST 08/29/2023 FINDINGS: Bones/Joint/Cartilage No fracture or dislocation. Normal alignment. No joint effusion. Prior transmetatarsal amputation. Bone marrow edema in the residual fifth metatarsal base concerning for osteomyelitis. Soft tissue wound overlying the base of the fifth metatarsal. Moderate osteoarthritis of the fourth TMT joint. Mild osteoarthritis of the fifth TMT joint. Mild osteoarthritis of the second and third TMT joints. IMPRESSION: 1. Prior transmetatarsal amputation. Bone marrow edema in the residual fifth metatarsal base concerning for osteomyelitis. Soft tissue wound overlying the base of the fifth metatarsal.   Aerobic/Anaerobic Culture w Gram Stain (surgical/deep wound) 10/31/2023 Specimen Description BONE Performed at [facility], [address] [place], [place] [address]  Special Requests LEFT FOOT 5TH METATARSAL Performed at [facility], [address] [place], [place] [address]  Gram  Stain NO WBC SEEN NO ORGANISMS SEEN  Culture No growth aerobically or anaerobically. Performed at [facility], [address] [place], [place] [address]  Report Status 11/05/2023 FINAL    Assessment: 1.  Ulcer left foot secondary to diabetes mellitus 2. diabetes mellitus w/ peripheral neuropathy 3.  History of transmetatarsal amputation left 4.  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  New finding of osseous irregularity to the fifth metatarsal of the left foot.  Stress reaction VS.  Osteomyelitis   Plan of Care:  -Patient was evaluated.   -Medically necessary excisional debridement including subcutaneous tissue was performed using a tissue nipper.  Excisional debridement of the necrotic nonviable tissue down to healthier bleeding viable tissue was performed with postdebridement measurement same as. -Continue diabetic shoes with diabetic Plastizote insoles and offloading padding -Continue wound care at [facility] weekly -Return to clinic in 3 weeks for surgical consult TA tendon transfer to correct for the varus deformity of the foot  Felecia Shelling, DPM [facility]  Dr. Felecia Shelling, DPM    [address] [place], [place] [address]                Office [phone]  Fax [phone]

## 2023-11-07 LAB — HM DIABETES EYE EXAM

## 2023-11-13 ENCOUNTER — Ambulatory Visit: Payer: BC Managed Care – PPO | Admitting: Physician Assistant

## 2023-11-13 IMAGING — CR DG CHEST 2V
2 series · 2 of 2 positions shown · non-contrast
Comparison: Portable chest 10/09/2021.
COMPARISON: Portable chest 10/09/2021.

Addendum:
CLINICAL DATA: Check right PICC placement.

EXAM:
CHEST - 2 VIEW

[w chest pa *]
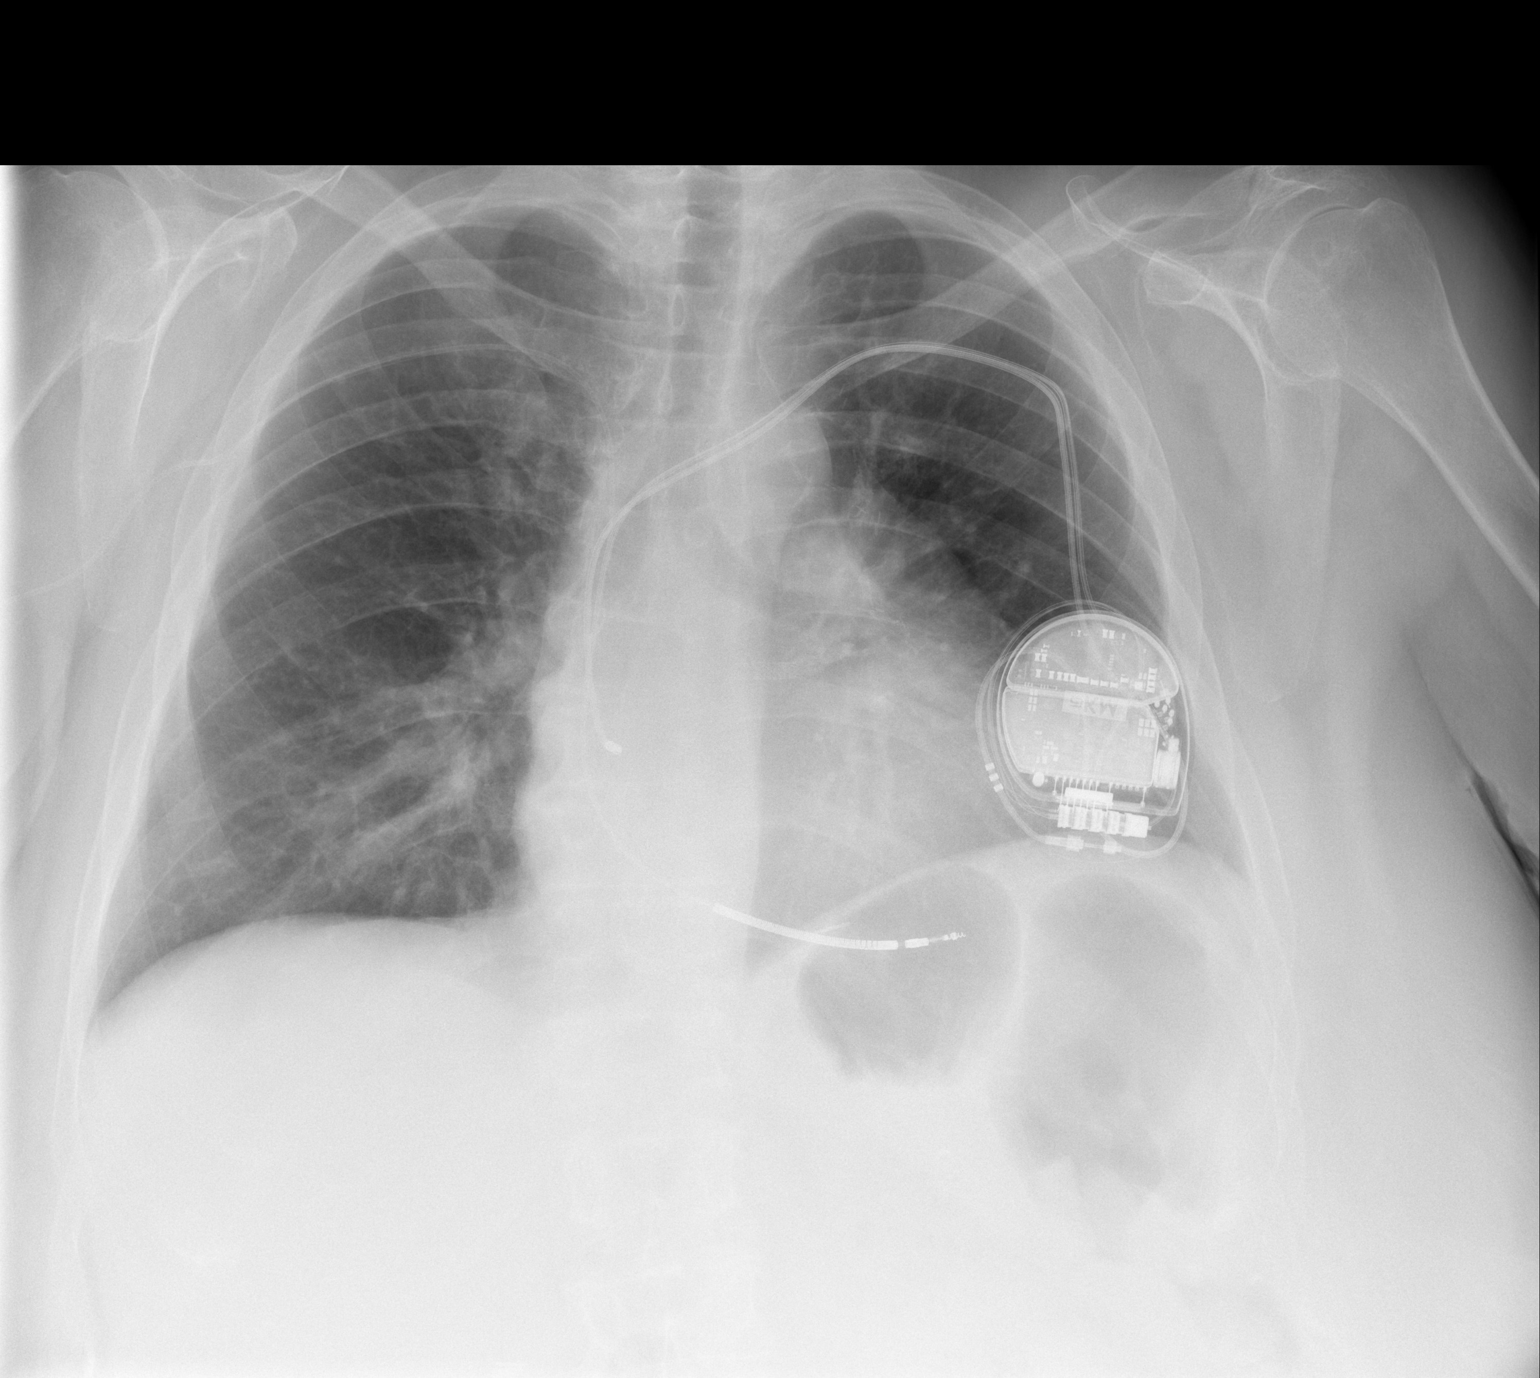

[w chest lat]
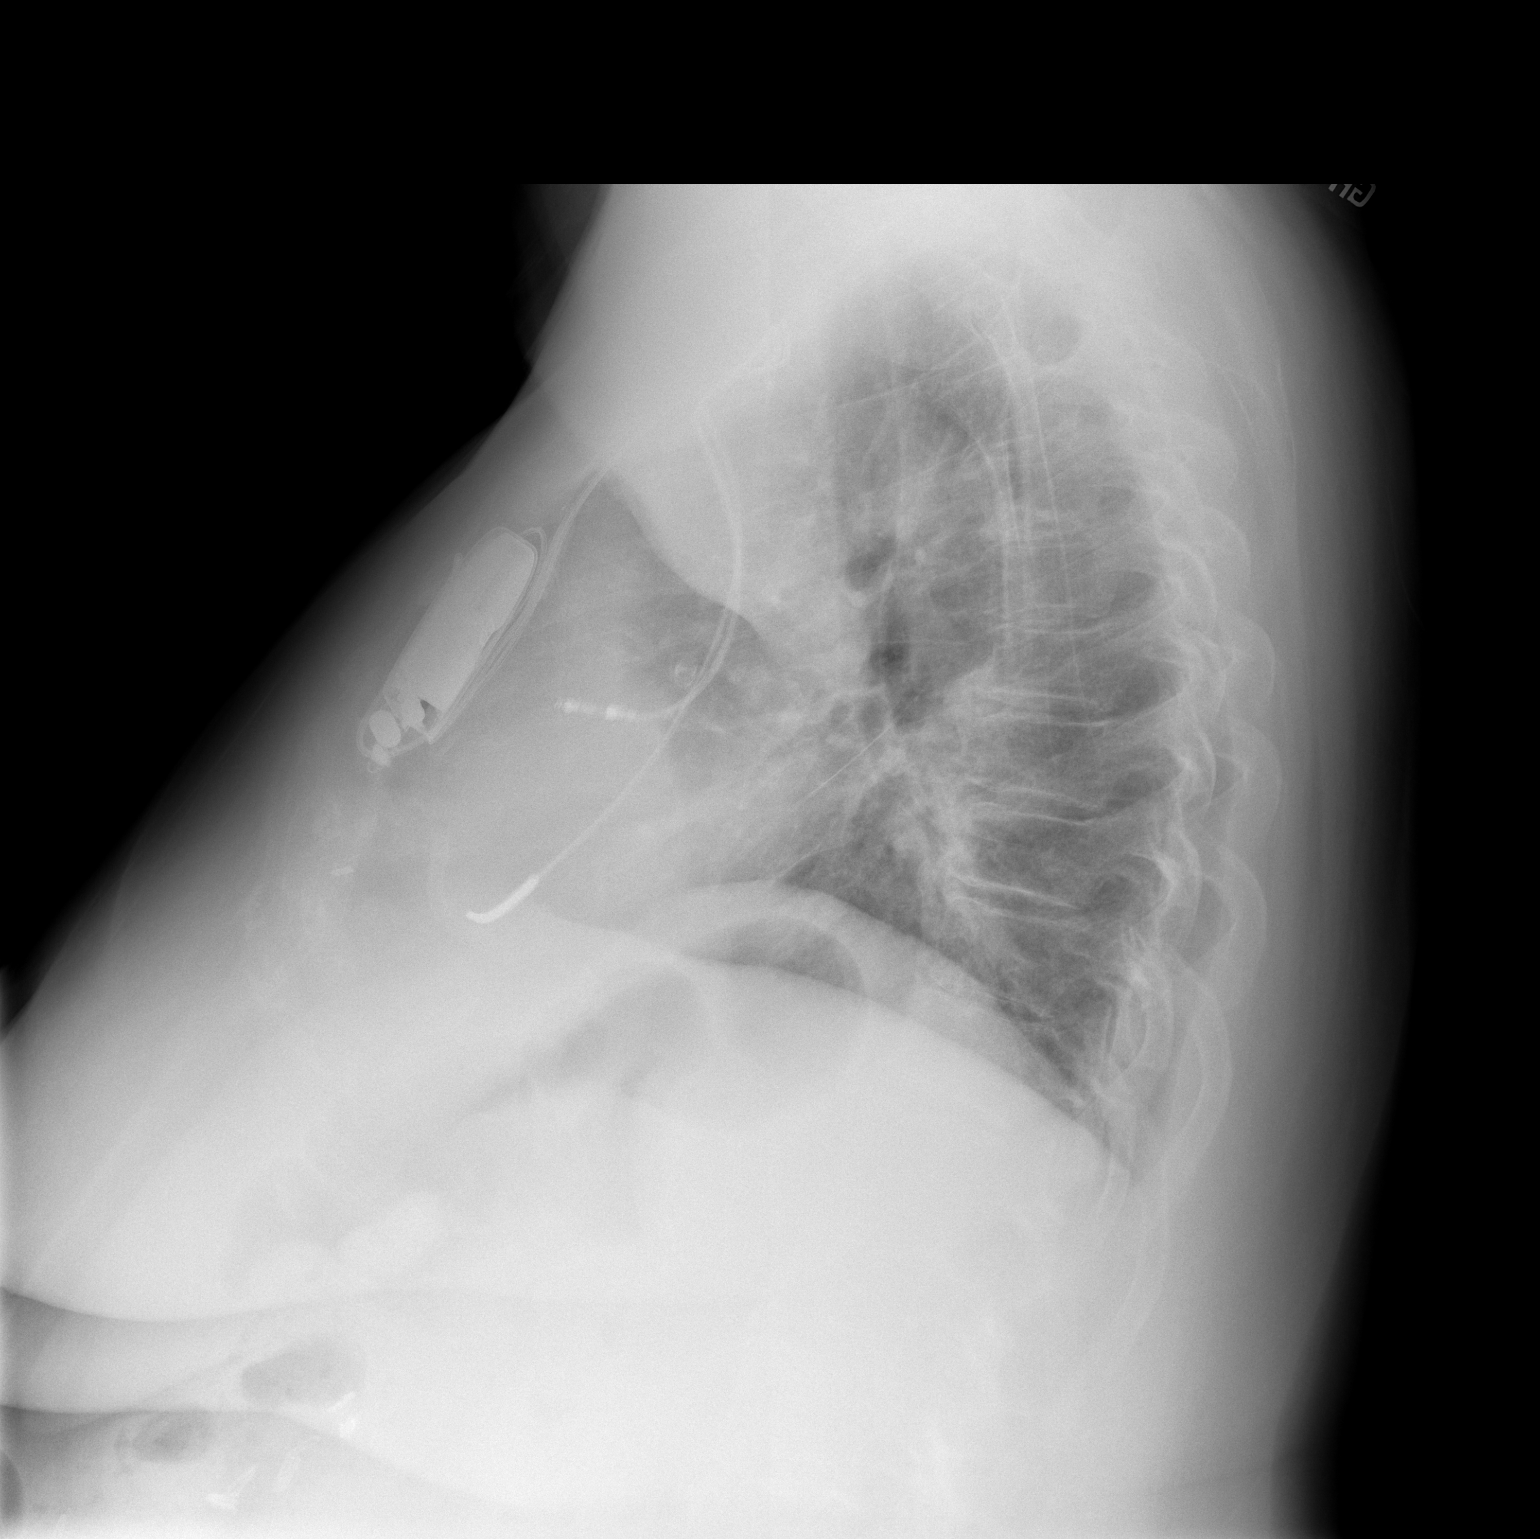

[2 of 2 positions shown; findings below may reference images not displayed]

FINDINGS: Right PICC tip appears to be just outside the right thoracic outlet,
at about the junction of the axillary and subclavian vein. There is
no pneumothorax.

Left chest dual lead pacing system and wire insertions are stable.
There is mild cardiomegaly without evidence of CHF or pleural
collections.

Left hemidiaphragm is slightly elevated. The sulci are sharp. There
is streaky opacity in the right infrahilar area which could be due
to atelectasis or infiltrate.

The remainder of the lungs are clear. Mild thoracic dextroscoliosis.
IMPRESSION: 1. Right PICC tip appears to be just outside the thoracic outlet at
the junction of the axillary and subclavian vein.
2. right infrahilar streaky opacity consistent with atelectasis or
pneumonia.

ADDENDUM:
Upon further review, the PICC tip is likely located at the level of
the superior vena cava. The tip is difficult to visualize due to
presence of AICD leads. PICC OK to use.

*** End of Addendum ***
FINDINGS: Right PICC tip appears to be just outside the right thoracic outlet,
at about the junction of the axillary and subclavian vein. There is
no pneumothorax.

Left chest dual lead pacing system and wire insertions are stable.
There is mild cardiomegaly without evidence of CHF or pleural
collections.

Left hemidiaphragm is slightly elevated. The sulci are sharp. There
is streaky opacity in the right infrahilar area which could be due
to atelectasis or infiltrate.

The remainder of the lungs are clear. Mild thoracic dextroscoliosis.
IMPRESSION: 1. Right PICC tip appears to be just outside the thoracic outlet at
the junction of the axillary and subclavian vein.
2. right infrahilar streaky opacity consistent with atelectasis or
pneumonia.

## 2023-11-15 ENCOUNTER — Ambulatory Visit (INDEPENDENT_AMBULATORY_CARE_PROVIDER_SITE_OTHER): Payer: BC Managed Care – PPO

## 2023-11-15 DIAGNOSIS — I472 Ventricular tachycardia, unspecified: Secondary | ICD-10-CM | POA: Diagnosis not present

## 2023-11-16 LAB — CUP PACEART REMOTE DEVICE CHECK
Battery Remaining Longevity: 82 mo
Battery Remaining Percentage: 73 %
Battery Voltage: 2.99 V
Brady Statistic AP VP Percent: 1 %
Brady Statistic AP VS Percent: 1 %
Brady Statistic AS VP Percent: 0 %
Brady Statistic AS VS Percent: 99 %
Brady Statistic RA Percent Paced: 1 %
Brady Statistic RV Percent Paced: 1 %
Date Time Interrogation Session: 20241129010016
HighPow Impedance: 62 Ohm
Implantable Lead Connection Status: 753985
Implantable Lead Connection Status: 753985
Implantable Lead Implant Date: 20220103
Implantable Lead Implant Date: 20220103
Implantable Lead Location: 753859
Implantable Lead Location: 753860
Implantable Pulse Generator Implant Date: 20220103
Lead Channel Impedance Value: 340 Ohm
Lead Channel Impedance Value: 400 Ohm
Lead Channel Pacing Threshold Amplitude: 1 V
Lead Channel Pacing Threshold Amplitude: 1 V
Lead Channel Pacing Threshold Pulse Width: 0.5 ms
Lead Channel Pacing Threshold Pulse Width: 0.5 ms
Lead Channel Sensing Intrinsic Amplitude: 12 mV
Lead Channel Sensing Intrinsic Amplitude: 4.6 mV
Lead Channel Setting Pacing Amplitude: 2 V
Lead Channel Setting Pacing Amplitude: 2.5 V
Lead Channel Setting Pacing Pulse Width: 0.5 ms
Lead Channel Setting Sensing Sensitivity: 0.5 mV
Pulse Gen Serial Number: 111037319

## 2023-11-18 ENCOUNTER — Encounter: Payer: BC Managed Care – PPO | Admitting: Podiatry

## 2023-11-18 ENCOUNTER — Telehealth: Payer: Self-pay | Admitting: Podiatry

## 2023-11-18 NOTE — Telephone Encounter (Signed)
Pt called stating she got a message for an appt today and she was told at her last appt to follow up in 3 wks.Around 12/11. I did read in the note she should be following up with wound care weekly and she has not been but is calling today to get an appt with them this week.  I rescheduled it for 12/11.

## 2023-11-21 DIAGNOSIS — L89893 Pressure ulcer of other site, stage 3: Secondary | ICD-10-CM | POA: Diagnosis not present

## 2023-11-21 DIAGNOSIS — L97522 Non-pressure chronic ulcer of other part of left foot with fat layer exposed: Secondary | ICD-10-CM | POA: Diagnosis not present

## 2023-11-27 ENCOUNTER — Other Ambulatory Visit: Payer: Self-pay | Admitting: Physician Assistant

## 2023-11-27 ENCOUNTER — Telehealth: Payer: Self-pay

## 2023-11-27 ENCOUNTER — Ambulatory Visit (INDEPENDENT_AMBULATORY_CARE_PROVIDER_SITE_OTHER): Payer: BC Managed Care – PPO | Admitting: Podiatry

## 2023-11-27 DIAGNOSIS — L97522 Non-pressure chronic ulcer of other part of left foot with fat layer exposed: Secondary | ICD-10-CM

## 2023-11-27 DIAGNOSIS — E0843 Diabetes mellitus due to underlying condition with diabetic autonomic (poly)neuropathy: Secondary | ICD-10-CM | POA: Diagnosis not present

## 2023-11-27 DIAGNOSIS — M21172 Varus deformity, not elsewhere classified, left ankle: Secondary | ICD-10-CM | POA: Diagnosis not present

## 2023-11-27 NOTE — Telephone Encounter (Signed)
Copied from CRM [id number]. Topic: Clinical - Medication Question >> Nov 27, 2023 12:20 PM Joanette Gula wrote: Reason for CRM: PT wants to get a PA for insulin glargine, 2 Unit Dial, (TOUJEO MAX SOLOSTAR) 300 UNIT/ML Solostar Pen so she can refill early. >> Nov 27, 2023  3:06 PM SARA R DAVIS wrote: Please call pt and see what she is talking about and why she needs to have this filled early

## 2023-11-27 NOTE — Progress Notes (Signed)
Chief Complaint  Patient presents with   Routine Post Op    RM#6 POV#2 left foot ulcer patient states now has a spot on the right foot third toe looks like early signs of infection husband had cut the nail.     Subjective:  62 y.o. female with PMHx of diabetes mellitus PSxHx TMA LT foot s/p bone biopsy of the left foot.  DOS: 10/31/2023.  Patient doing well.  She has noticed improvement of the ulcer to the plantar aspect of the foot.  She is anxious to proceed with tendon transfer surgery to better align and correct for the adductus of the amputation stump  Past Medical History:  Diagnosis Date   Abnormal EKG 10/23/2021   Abnormal mammogram of left breast 09/04/2021   Abnormal stress test 10/23/2021   Acute chest pain 05/08/2016   AICD (automatic cardioverter/defibrillator) present    St Jude/Abbott device   AKI (acute kidney injury) (HCC) 10/23/2021   Angina pectoris (HCC) 09/11/2017   Anxiety 10/23/2021   Bacteremia 11/22/2021   Breast wound, right, subsequent encounter 09/04/2021   Burping 05/08/2016   Cancer (HCC)    cervical - hysterectomy   Cellulitis of right breast 10/23/2021   Cellulitis, wound, post-operative 10/09/2021   Chronic diastolic heart failure (HCC) 11/06/2016   Coronary artery disease involving native coronary artery of native heart with angina pectoris (HCC) 09/12/2015   Overview:  PCI and stent of RCA 2009, last cath 2012 with medical therapy  CABG May 2017   Demand ischemia [facility])    Diabetic foot infection (HCC) 11/21/2021   Diabetic foot ulcer (HCC) 02/16/2020   Emphysema lung (HCC) 09/11/2017   patient not aware of this dx   Essential hypertension 09/12/2015   Gangrene (HCC) 10/23/2021   GERD (gastroesophageal reflux disease) 05/08/2016   Hematoma 10/23/2021   Hiatal hernia 10/23/2021   Hyperglycemia 10/23/2021   Hyperglycemia due to type 2 diabetes mellitus (HCC) 10/23/2021   Hyperlipidemia 09/12/2015   Hyponatremia 10/23/2021   ICD  (implantable cardioverter-defibrillator) in place 05/16/2021   Infection of great toe 10/23/2021   Lactic acidosis 10/23/2021   Leukocytosis 10/23/2021   Mediastinitis 06/28/2016   Morbid obesity (HCC) 05/08/2016   Myocardial infarction [facility])    NSTEMI (non-ST elevated myocardial infarction) (HCC) 05/22/2020   Obesity (BMI 30-39.9) 10/23/2021   Perineal abscess 10/23/2021   Peripheral vascular disease (HCC)    S/P CABG (coronary artery bypass graft) 06/03/2016   Overview:  The patient underwent sternal reconstruction on 06/21/16 with pec flaps for mediastinitis from a prior CABG in May 2017. On admission, she was critically ill from sepsis and had altered mental status. She was last seen in clinic on 08/09/16 at which time she was doing well.   Severe sepsis (HCC) 06/03/2016   Sinus tachycardia 05/08/2016   Tobacco use disorder 04/20/2016   Overview:  Quit in May 2017.   Type 2 diabetes mellitus with foot ulcer (HCC) 05/08/2016   Unstable angina (HCC) 05/22/2020   Ventricular tachycardia (HCC) 12/17/2020   Wound, surgical, infected 06/06/2016   Overview:  sternal    Past Surgical History:  Procedure Laterality Date   ABDOMINAL AORTOGRAM W/LOWER EXTREMITY N/A 10/11/2021   Procedure: ABDOMINAL AORTOGRAM W/LOWER EXTREMITY;  Surgeon: Cephus Shelling, MD;  [facility]: [facility] INVASIVE CV LAB;  Service: Cardiovascular;  Laterality: N/A;   AMPUTATION TOE Right 02/20/2020   Procedure: AMPUTATION RIGHT GREAT  TOE;  Surgeon: Candelaria Stagers, DPM;  Location: [facility] OR;  Service: Podiatry;  Laterality:  Right;   BLADDER SURGERY     BONE BIOPSY Left 02/01/2022   Procedure: BONE BIOPSY LEFT FOOT, IRRIGATION AND DEBRIDEMENT;  Surgeon: Asencion Islam, DPM;  Location: [facility] OR;  Service: Podiatry;  Laterality: Left;   BONE BIOPSY Left 10/31/2023   Procedure: BONE BIOPSY;  Surgeon: Felecia Shelling, DPM;  Location: [facility] ORS;  Service: Orthopedics/Podiatry;  Laterality: Left;   BUBBLE STUDY  11/27/2021   Procedure:  BUBBLE STUDY;  Surgeon: Pricilla Riffle, MD;  Location: [facility] ENDOSCOPY;  Service: Cardiovascular;;   CARDIAC CATHETERIZATION     CORONARY ARTERY BYPASS GRAFT     CORONARY STENT INTERVENTION N/A 05/25/2020   Procedure: CORONARY STENT INTERVENTION;  Surgeon: Iran Ouch, MD;  [facility]: [facility];  Service: Cardiovascular;  Laterality: N/A;   CORONARY STENT INTERVENTION N/A 06/22/2020   Procedure: CORONARY STENT INTERVENTION;  Surgeon: Marykay Lex, MD;  Location: [facility] INVASIVE CV LAB;  Service: Cardiovascular;  Laterality: N/A;   CORONARY ULTRASOUND/IVUS N/A 06/22/2020   Procedure: Intravascular Ultrasound/IVUS;  Surgeon: Marykay Lex, MD;  Location: [facility] INVASIVE CV LAB;  Service: Cardiovascular;  Laterality: N/A;   ICD IMPLANT N/A 12/19/2020   Procedure: ICD IMPLANT;  Surgeon: Marinus Maw, MD;  Location: [facility] INVASIVE CV LAB;  Service: Cardiovascular;  Laterality: N/A;   INCISION AND DRAINAGE Right 02/19/2020   Procedure: INCISION AND DRAINAGE;  Surgeon: Vivi Barrack, DPM;  Location: [facility] OR;  Service: Podiatry;  Laterality: Right;  Block done by surgeon   INCISION AND DRAINAGE OF WOUND Left 10/31/2023   Procedure: IRRIGATION AND DEBRIDEMENT WOUND;  Surgeon: Felecia Shelling, DPM;  Location: [facility] ORS;  Service: Orthopedics/Podiatry;  Laterality: Left;   IRRIGATION AND DEBRIDEMENT FOOT Left 10/10/2021   Procedure: IRRIGATION AND DEBRIDEMENT FOOT;  Surgeon: Asencion Islam, DPM;  Location: [facility];  Service: Podiatry;  Laterality: Left;   IRRIGATION AND DEBRIDEMENT FOOT Left 11/23/2021   Procedure: IRRIGATION AND DEBRIDEMENT FOOT;  Surgeon: Louann Sjogren, MD;  Location: [facility] OR;  Service: Podiatry;  Laterality: Left;  will do local block   LEFT HEART CATH AND CORS/GRAFTS ANGIOGRAPHY N/A 05/23/2020   Procedure: LEFT HEART CATH AND CORS/GRAFTS ANGIOGRAPHY;  Surgeon: Yvonne Kendall, MD;  [facility]: [facility];  Service: Cardiovascular;  Laterality: N/A;   LEFT HEART CATH AND CORS/GRAFTS  ANGIOGRAPHY N/A 12/19/2020   Procedure: LEFT HEART CATH AND CORS/GRAFTS ANGIOGRAPHY;  Surgeon: Kathleene Hazel, MD;  [facility];  Service: Cardiovascular;  Laterality: N/A;   LEFT HEART CATH AND CORS/GRAFTS ANGIOGRAPHY N/A 07/21/2021   Procedure: LEFT HEART CATH AND CORS/GRAFTS ANGIOGRAPHY;  Surgeon: Yvonne Kendall, MD;  [facility];  Service: Cardiovascular;  Laterality: N/A;   METATARSAL HEAD EXCISION Right 05/02/2020   Procedure: FIRST METATARSAL HEAD RESECTION; RIGHT FOOT WOUND CLOSURE;  Surgeon: Asencion Islam, DPM;  Location: [facility];  Service: Podiatry;  Laterality: Right;  MAC W/LOCAL   PERIPHERAL VASCULAR BALLOON ANGIOPLASTY Left 10/11/2021   Procedure: PERIPHERAL VASCULAR BALLOON ANGIOPLASTY;  Surgeon: Cephus Shelling, MD;  [facility]: [facility];  Service: Cardiovascular;  Laterality: Left;  Anterior Tibial Artery   TEE WITHOUT CARDIOVERSION N/A 11/27/2021   Procedure: TRANSESOPHAGEAL ECHOCARDIOGRAM (TEE);  Surgeon: Pricilla Riffle, MD;  Location: [facility] ENDOSCOPY;  Service: Cardiovascular;  Laterality: N/A;   TRANSMETATARSAL AMPUTATION Left 11/23/2021   Procedure: TRANSMETATARSAL AMPUTATION;  Surgeon: Louann Sjogren, MD;  Location: [facility] OR;  Service: Podiatry;  Laterality: Left;   TUBAL LIGATION  Allergies  Allergen Reactions   Vancomycin Anaphylaxis   Chlorhexidine Gluconate Itching    Received CHG bath, began itching, required benadryl   Cat Hair Extract Other (See Comments)    Sneezing, watery eyes.   Tramadol Other (See Comments)    Hallucinations   Codeine Rash    LT foot 10/24/2022  LT foot 10/24/2022   LT foot 08/13/2023   LT foot 09/23/2023   LT foot 11/06/2023   Objective/Physical Exam General: The patient is alert and oriented x3 in no acute distress.  Dermatology:  The wound continues to improve.  Very stable.  Measuring approximately 0.5-0 0.5 to 0.2 cm.  Granular wound base.  No  drainage.  No exposed bone muscle tendon ligament or joint.  Good potential for healing and good improvement  Vascular: Known history of peripheral vascular disease.  No edema or erythema noted.   VAS [place] ABI WITH/WO TBI 05/08/2022 ABI Findings:  +--------+------------------+-----+--------+--------+  Right  Rt Pressure (mmHg)IndexWaveformComment   +--------+------------------+-----+--------+--------+  [id number]                                     +--------+------------------+-----+--------+--------+  PTA    125               0.82 biphasic          +--------+------------------+-----+--------+--------+  DP     145               0.95 biphasic          +--------+------------------+-----+--------+--------+   +--------+------------------+-----+--------+-------+  Left   Lt Pressure (mmHg)IndexWaveformComment  +--------+------------------+-----+--------+-------+  [id number]                                    +--------+------------------+-----+--------+-------+  ATA                           absent           +--------+------------------+-----+--------+-------+  PTA    0                 0.00 absent           +--------+------------------+-----+--------+-------+  DP     180               1.18 biphasic         +--------+------------------+-----+--------+-------+   +-------+-----------+-----------+------------+------------+  ABI/TBIToday's ABIToday's TBIPrevious ABIPrevious TBI  +-------+-----------+-----------+------------+------------+  Right 0.95       Amputation 0.80        Amputation    +-------+-----------+-----------+------------+------------+  Left  1.18       Amputation 1.07        Amputation    +-------+-----------+-----------+------------+------------+  Right ABIs appear increased compared to prior study on 11/14/2021.  Compared to prior study on 11/14/2021.    Summary:  Right: Resting right ankle-brachial  index is within normal range. No  evidence of significant right lower extremity arterial disease.   Left: Resting left ankle-brachial index is within normal range. No  evidence of significant left lower extremity arterial disease.   Neurological: Light touch and protective threshold diminished bilaterally.   Musculoskeletal Exam: Unchanged.  PSxHx transmetatarsal amputation LT; healed.  Patient also has a very prominent forefoot varus deformity which increases lateral column loading likely secondary to loss of the peroneus brevis tendon at  the fifth metatarsal tubercle at the time of amputation.  This is likely the reason for the ulcer to the lateral aspect of the foot.  The forefoot is very reducible/flexible. Taught tibialis anterior tendon noted  Radiographic exam LT foot 09/26/2022: Clean osteotomy sites.  No cortical erosions, fractures noted.  Radiographically no evidence of osteomyelitis  Radiographic exam LT foot 08/12/2023: Osseous irregularity noted to the remaining portion of the fifth metatarsal of the left foot.  Consistent with possible osteomyelitis versus stress reaction secondary to weightbearing.  No obvious fractures identified.  MR FOOT LEFT WO CONTRAST 08/29/2023 FINDINGS: Bones/Joint/Cartilage No fracture or dislocation. Normal alignment. No joint effusion. Prior transmetatarsal amputation. Bone marrow edema in the residual fifth metatarsal base concerning for osteomyelitis. Soft tissue wound overlying the base of the fifth metatarsal. Moderate osteoarthritis of the fourth TMT joint. Mild osteoarthritis of the fifth TMT joint. Mild osteoarthritis of the second and third TMT joints. IMPRESSION: 1. Prior transmetatarsal amputation. Bone marrow edema in the residual fifth metatarsal base concerning for osteomyelitis. Soft tissue wound overlying the base of the fifth metatarsal.   Aerobic/Anaerobic Culture w Gram Stain (surgical/deep wound) 10/31/2023 Specimen Description  BONE Performed at [facility], [address], [place], [place] [address]  Special Requests LEFT FOOT 5TH METATARSAL Performed at [facility], [address], [place], [place] [address]  Gram Stain NO WBC SEEN NO ORGANISMS SEEN  Culture No growth aerobically or anaerobically. Performed at [facility], [address], [place], [place] [address]  Report Status 11/05/2023 FINAL    Assessment: 1.  Ulcer left foot secondary to diabetes mellitus 2. diabetes mellitus w/ peripheral neuropathy 3.  History of transmetatarsal amputation left 4.  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  S/P bone biopsy fifth metatarsal of the left foot negative for osteomyelitis.  DOS: 10/31/2023   Plan of Care:  -Patient was evaluated.   -Medically necessary excisional debridement including subcutaneous tissue was performed using a tissue nipper.  Excisional debridement of the necrotic nonviable tissue down to healthier bleeding viable tissue was performed with postdebridement measurement same as. -Continue diabetic shoes with diabetic Plastizote insoles and offloading padding -Continue wound care at [facility] weekly - Today we discussed in detail as well as in the past about tibialis anterior tendon transfer to better correct for the severe adductus deformity of the foot.  The patient's foot deformity is reducible and very flexible.  I do believe that by transferring the tibialis anterior tendon this will help to correct for the varus deformity of the foot and prevent the chronic ulcer to the plantar aspect of the lateral column of the amputation stump.  She has tried multiple conservative modalities including offloading techniques as well as diabetic shoes with no permanent alleviation of her symptoms.  She continues to go to the wound care center weekly.  Risk benefits advantages and disadvantages of the  procedure were explained in detail to the patient.  No guarantees were expressed or implied.  All patient questions were answered.  Postoperative recovery course was also explained.  She understands that she will be nonweightbearing for about 4 to 6 weeks after surgery. -Authorization for surgery was initiated today.  Surgery will consist of tibialis anterior tendon transfer left -Return to clinic 1 week postop  Felecia Shelling, DPM [facility]  Dr. Felecia Shelling, DPM    [address]  [place], [place] [address]                Office [phone]  Fax [phone]

## 2023-11-27 NOTE — Telephone Encounter (Signed)
Call patient was unable to leave message

## 2023-11-29 ENCOUNTER — Telehealth: Payer: Self-pay | Admitting: Podiatry

## 2023-11-29 NOTE — Telephone Encounter (Signed)
Pt called stating that she hasn't received a call to schedule her next surgery with Dr.Evans. She was here on 12/11 and Dr.Evans went over her next procedure with her and would like to get scheduled before the end of the year due to insurance. The best number to reach her is [phone].

## 2023-12-02 ENCOUNTER — Ambulatory Visit (INDEPENDENT_AMBULATORY_CARE_PROVIDER_SITE_OTHER): Payer: BC Managed Care – PPO | Admitting: Podiatry

## 2023-12-02 ENCOUNTER — Encounter: Payer: Self-pay | Admitting: Podiatry

## 2023-12-02 ENCOUNTER — Ambulatory Visit (INDEPENDENT_AMBULATORY_CARE_PROVIDER_SITE_OTHER): Payer: BC Managed Care – PPO

## 2023-12-02 DIAGNOSIS — L97522 Non-pressure chronic ulcer of other part of left foot with fat layer exposed: Secondary | ICD-10-CM | POA: Diagnosis not present

## 2023-12-02 NOTE — Progress Notes (Signed)
Chief Complaint  Patient presents with   Routine Post Op    Patient states"  her foot left foot has been fine , it is no better but it wont get better until he gets done fixing it patient states"    Subjective:  62 y.o. female with PMHx of diabetes mellitus PSxHx TMA LT foot s/p bone biopsy of the left foot.  DOS: 10/31/2023.  Patient had an appointment scheduled for today so she came in.  She already had consent for tendon transfer surgery initiated last visit on 11/27/2023.  No new complaints.  Past Medical History:  Diagnosis Date   Abnormal EKG 10/23/2021   Abnormal mammogram of left breast 09/04/2021   Abnormal stress test 10/23/2021   Acute chest pain 05/08/2016   AICD (automatic cardioverter/defibrillator) present    St Jude/Abbott device   AKI (acute kidney injury) (HCC) 10/23/2021   Angina pectoris (HCC) 09/11/2017   Anxiety 10/23/2021   Bacteremia 11/22/2021   Breast wound, right, subsequent encounter 09/04/2021   Burping 05/08/2016   Cancer (HCC)    cervical - hysterectomy   Cellulitis of right breast 10/23/2021   Cellulitis, wound, post-operative 10/09/2021   Chronic diastolic heart failure (HCC) 11/06/2016   Coronary artery disease involving native coronary artery of native heart with angina pectoris (HCC) 09/12/2015   Overview:  PCI and stent of RCA 2009, last cath 2012 with medical therapy  CABG May 2017   Demand ischemia [facility])    Diabetic foot infection (HCC) 11/21/2021   Diabetic foot ulcer (HCC) 02/16/2020   Emphysema lung (HCC) 09/11/2017   patient not aware of this dx   Essential hypertension 09/12/2015   Gangrene (HCC) 10/23/2021   GERD (gastroesophageal reflux disease) 05/08/2016   Hematoma 10/23/2021   Hiatal hernia 10/23/2021   Hyperglycemia 10/23/2021   Hyperglycemia due to type 2 diabetes mellitus (HCC) 10/23/2021   Hyperlipidemia 09/12/2015   Hyponatremia 10/23/2021   ICD (implantable cardioverter-defibrillator) in place 05/16/2021    Infection of great toe 10/23/2021   Lactic acidosis 10/23/2021   Leukocytosis 10/23/2021   Mediastinitis 06/28/2016   Morbid obesity (HCC) 05/08/2016   Myocardial infarction [facility])    NSTEMI (non-ST elevated myocardial infarction) (HCC) 05/22/2020   Obesity (BMI 30-39.9) 10/23/2021   Perineal abscess 10/23/2021   Peripheral vascular disease (HCC)    S/P CABG (coronary artery bypass graft) 06/03/2016   Overview:  The patient underwent sternal reconstruction on 06/21/16 with pec flaps for mediastinitis from a prior CABG in May 2017. On admission, she was critically ill from sepsis and had altered mental status. She was last seen in clinic on 08/09/16 at which time she was doing well.   Severe sepsis (HCC) 06/03/2016   Sinus tachycardia 05/08/2016   Tobacco use disorder 04/20/2016   Overview:  Quit in May 2017.   Type 2 diabetes mellitus with foot ulcer (HCC) 05/08/2016   Unstable angina (HCC) 05/22/2020   Ventricular tachycardia (HCC) 12/17/2020   Wound, surgical, infected 06/06/2016   Overview:  sternal    Past Surgical History:  Procedure Laterality Date   ABDOMINAL AORTOGRAM W/LOWER EXTREMITY N/A 10/11/2021   Procedure: ABDOMINAL AORTOGRAM W/LOWER EXTREMITY;  Surgeon: Cephus Shelling, MD;  Location: [facility] INVASIVE CV LAB;  Service: Cardiovascular;  Laterality: N/A;   AMPUTATION TOE Right 02/20/2020   Procedure: AMPUTATION RIGHT GREAT  TOE;  Surgeon: Candelaria Stagers, DPM;  Location: [facility] OR;  Service: Podiatry;  Laterality: Right;   BLADDER SURGERY     BONE BIOPSY Left  02/01/2022   Procedure: BONE BIOPSY LEFT FOOT, IRRIGATION AND DEBRIDEMENT;  Surgeon: Asencion Islam, DPM;  Location: [facility] OR;  Service: Podiatry;  Laterality: Left;   BONE BIOPSY Left 10/31/2023   Procedure: BONE BIOPSY;  Surgeon: Felecia Shelling, DPM;  Location: [facility] ORS;  Service: Orthopedics/Podiatry;  Laterality: Left;   BUBBLE STUDY  11/27/2021   Procedure: BUBBLE STUDY;  Surgeon: Pricilla Riffle, MD;  Location: [facility] ENDOSCOPY;   Service: Cardiovascular;;   CARDIAC CATHETERIZATION     CORONARY ARTERY BYPASS GRAFT     CORONARY STENT INTERVENTION N/A 05/25/2020   Procedure: CORONARY STENT INTERVENTION;  Surgeon: Iran Ouch, MD;  [facility]: [facility];  Service: Cardiovascular;  Laterality: N/A;   CORONARY STENT INTERVENTION N/A 06/22/2020   Procedure: CORONARY STENT INTERVENTION;  Surgeon: Marykay Lex, MD;  Location: [facility] INVASIVE CV LAB;  Service: Cardiovascular;  Laterality: N/A;   CORONARY ULTRASOUND/IVUS N/A 06/22/2020   Procedure: Intravascular Ultrasound/IVUS;  Surgeon: Marykay Lex, MD;  Location: [facility] INVASIVE CV LAB;  Service: Cardiovascular;  Laterality: N/A;   ICD IMPLANT N/A 12/19/2020   Procedure: ICD IMPLANT;  Surgeon: Marinus Maw, MD;  Location: [facility] INVASIVE CV LAB;  Service: Cardiovascular;  Laterality: N/A;   INCISION AND DRAINAGE Right 02/19/2020   Procedure: INCISION AND DRAINAGE;  Surgeon: Vivi Barrack, DPM;  Location: [facility] OR;  Service: Podiatry;  Laterality: Right;  Block done by surgeon   INCISION AND DRAINAGE OF WOUND Left 10/31/2023   Procedure: IRRIGATION AND DEBRIDEMENT WOUND;  Surgeon: Felecia Shelling, DPM;  Location: [facility] ORS;  Service: Orthopedics/Podiatry;  Laterality: Left;   IRRIGATION AND DEBRIDEMENT FOOT Left 10/10/2021   Procedure: IRRIGATION AND DEBRIDEMENT FOOT;  Surgeon: Asencion Islam, DPM;  Location: [facility] OR;  Service: Podiatry;  Laterality: Left;   IRRIGATION AND DEBRIDEMENT FOOT Left 11/23/2021   Procedure: IRRIGATION AND DEBRIDEMENT FOOT;  Surgeon: Louann Sjogren, MD;  Location: [facility] OR;  Service: Podiatry;  Laterality: Left;  will do local block   LEFT HEART CATH AND CORS/GRAFTS ANGIOGRAPHY N/A 05/23/2020   Procedure: LEFT HEART CATH AND CORS/GRAFTS ANGIOGRAPHY;  Surgeon: Yvonne Kendall, MD;  [facility]: [facility];  Service: Cardiovascular;  Laterality: N/A;   LEFT HEART CATH AND CORS/GRAFTS ANGIOGRAPHY N/A 12/19/2020   Procedure: LEFT HEART CATH AND CORS/GRAFTS  ANGIOGRAPHY;  Surgeon: Kathleene Hazel, MD;  [facility]: [facility];  Service: Cardiovascular;  Laterality: N/A;   LEFT HEART CATH AND CORS/GRAFTS ANGIOGRAPHY N/A 07/21/2021   Procedure: LEFT HEART CATH AND CORS/GRAFTS ANGIOGRAPHY;  Surgeon: Yvonne Kendall, MD;  [facility];  Service: Cardiovascular;  Laterality: N/A;   METATARSAL HEAD EXCISION Right 05/02/2020   Procedure: FIRST METATARSAL HEAD RESECTION; RIGHT FOOT WOUND CLOSURE;  Surgeon: Asencion Islam, DPM;  Location: [facility];  Service: Podiatry;  Laterality: Right;  MAC W/LOCAL   PERIPHERAL VASCULAR BALLOON ANGIOPLASTY Left 10/11/2021   Procedure: PERIPHERAL VASCULAR BALLOON ANGIOPLASTY;  Surgeon: Cephus Shelling, MD;  [facility]: [facility];  Service: Cardiovascular;  Laterality: Left;  Anterior Tibial Artery   TEE WITHOUT CARDIOVERSION N/A 11/27/2021   Procedure: TRANSESOPHAGEAL ECHOCARDIOGRAM (TEE);  Surgeon: Pricilla Riffle, MD;  Location: [facility] ENDOSCOPY;  Service: Cardiovascular;  Laterality: N/A;   TRANSMETATARSAL AMPUTATION Left 11/23/2021   Procedure: TRANSMETATARSAL AMPUTATION;  Surgeon: Louann Sjogren, MD;  Location: [facility] OR;  Service: Podiatry;  Laterality: Left;   TUBAL LIGATION      Allergies  Allergen Reactions   Vancomycin Anaphylaxis  Chlorhexidine Gluconate Itching    Received CHG bath, began itching, required benadryl   Cat Hair Extract Other (See Comments)    Sneezing, watery eyes.   Tramadol Other (See Comments)    Hallucinations   Codeine Rash    LT foot 10/24/2022  LT foot 10/24/2022   LT foot 08/13/2023   LT foot 09/23/2023   LT foot 11/06/2023   Objective/Physical Exam General: The patient is alert and oriented x3 in no acute distress.  Dermatology:  The wound continues to improve.  Very stable.  Measuring approximately 0.5-0 0.5 to 0.2 cm.  Granular wound base.  No drainage.  No exposed bone muscle tendon ligament or joint.  Good potential for  healing and good improvement  Vascular: Known history of peripheral vascular disease.  No edema or erythema noted.   VAS [place] ABI WITH/WO TBI 05/08/2022 ABI Findings:  +--------+------------------+-----+--------+--------+  Right  Rt Pressure (mmHg)IndexWaveformComment   +--------+------------------+-----+--------+--------+  [id number]                                     +--------+------------------+-----+--------+--------+  PTA    125               0.82 biphasic          +--------+------------------+-----+--------+--------+  DP     145               0.95 biphasic          +--------+------------------+-----+--------+--------+   +--------+------------------+-----+--------+-------+  Left   Lt Pressure (mmHg)IndexWaveformComment  +--------+------------------+-----+--------+-------+  [id number]                                    +--------+------------------+-----+--------+-------+  ATA                           absent           +--------+------------------+-----+--------+-------+  PTA    0                 0.00 absent           +--------+------------------+-----+--------+-------+  DP     180               1.18 biphasic         +--------+------------------+-----+--------+-------+   +-------+-----------+-----------+------------+------------+  ABI/TBIToday's ABIToday's TBIPrevious ABIPrevious TBI  +-------+-----------+-----------+------------+------------+  Right 0.95       Amputation 0.80        Amputation    +-------+-----------+-----------+------------+------------+  Left  1.18       Amputation 1.07        Amputation    +-------+-----------+-----------+------------+------------+  Right ABIs appear increased compared to prior study on 11/14/2021.  Compared to prior study on 11/14/2021.    Summary:  Right: Resting right ankle-brachial index is within normal range. No  evidence of significant right lower extremity  arterial disease.   Left: Resting left ankle-brachial index is within normal range. No  evidence of significant left lower extremity arterial disease.   Neurological: Light touch and protective threshold diminished bilaterally.   Musculoskeletal Exam: Unchanged.  PSxHx transmetatarsal amputation LT; healed.  Patient also has a very prominent forefoot varus deformity which increases lateral column loading likely secondary to loss of the peroneus brevis tendon at the fifth metatarsal tubercle at the time of amputation.  This is likely the reason for the ulcer to the lateral aspect of the foot.  The forefoot is very reducible/flexible. Taught tibialis anterior tendon noted  Radiographic exam LT foot 09/26/2022: Clean osteotomy sites.  No cortical erosions, fractures noted.  Radiographically no evidence of osteomyelitis  Radiographic exam LT foot 08/12/2023: Osseous irregularity noted to the remaining portion of the fifth metatarsal of the left foot.  Consistent with possible osteomyelitis versus stress reaction secondary to weightbearing.  No obvious fractures identified.  MR FOOT LEFT WO CONTRAST 08/29/2023 FINDINGS: Bones/Joint/Cartilage No fracture or dislocation. Normal alignment. No joint effusion. Prior transmetatarsal amputation. Bone marrow edema in the residual fifth metatarsal base concerning for osteomyelitis. Soft tissue wound overlying the base of the fifth metatarsal. Moderate osteoarthritis of the fourth TMT joint. Mild osteoarthritis of the fifth TMT joint. Mild osteoarthritis of the second and third TMT joints. IMPRESSION: 1. Prior transmetatarsal amputation. Bone marrow edema in the residual fifth metatarsal base concerning for osteomyelitis. Soft tissue wound overlying the base of the fifth metatarsal.   Aerobic/Anaerobic Culture w Gram Stain (surgical/deep wound) 10/31/2023 Specimen Description BONE Performed at [facility], [address],  [place], [place] [address]  Special Requests LEFT FOOT 5TH METATARSAL Performed at [facility], [address], [place], [place] [address]  Gram Stain NO WBC SEEN NO ORGANISMS SEEN  Culture No growth aerobically or anaerobically. Performed at [facility], [address], [place], [place] [address]  Report Status 11/05/2023 FINAL    Assessment: 1.  Ulcer left foot secondary to diabetes mellitus 2. diabetes mellitus w/ peripheral neuropathy 3.  History of transmetatarsal amputation left 4.  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  S/P bone biopsy fifth metatarsal of the left foot negative for osteomyelitis.  DOS: 10/31/2023   Plan of Care:  -Patient was evaluated.   -Patient had a scheduled appointment today but all of her questions were answered.  She is ready proceed with surgery. -Continue wound care at [facility] weekly - Authorization for surgery was initiated last visit on 11/27/2023.  Surgery will consist of tibialis anterior tendon transfer left -Return to clinic 1 week postop  Felecia Shelling, DPM [facility]  Dr. Felecia Shelling, DPM    [address] [place], [place] [address]                Office [phone]  Fax [phone]

## 2023-12-03 ENCOUNTER — Telehealth: Payer: Self-pay | Admitting: Podiatry

## 2023-12-03 NOTE — Telephone Encounter (Signed)
DOS-12/06/23  TIBIALIS ANTERIOR TENON [id number]  [employer] EFFECTIVE DATE- 07/11/23  DEDUCTIBLE- $1500.00 WITH REMAINING $0.00 OOP-$4000.00 WITH REMAINING $0.00 COINSURANCE- 15%  SPOKE WITH LEDONNA M. FROM [employer] AND SHE STATED THAT PRIOR AUTH IS NOT REQUIRED FOR CPT CODE 08657.  CALL REFERENCE NUMBER: LEDONNA M. 12/03/23 @3 :48PM EST

## 2023-12-05 ENCOUNTER — Encounter: Payer: Self-pay | Admitting: Family Medicine

## 2023-12-05 ENCOUNTER — Encounter
Admission: RE | Admit: 2023-12-05 | Discharge: 2023-12-05 | Disposition: A | Discharge status: Home / Self Care | Payer: BC Managed Care – PPO | Source: Ambulatory Visit | Attending: Podiatry | Admitting: Podiatry

## 2023-12-05 ENCOUNTER — Ambulatory Visit: Payer: BC Managed Care – PPO | Admitting: Family Medicine

## 2023-12-05 VITALS — BP 120/74 | HR 92 | Temp 97.2°F | Resp 20 | Ht 67.0 in | Wt 220.6 lb

## 2023-12-05 DIAGNOSIS — L97422 Non-pressure chronic ulcer of left heel and midfoot with fat layer exposed: Secondary | ICD-10-CM | POA: Diagnosis not present

## 2023-12-05 DIAGNOSIS — Z01818 Encounter for other preprocedural examination: Secondary | ICD-10-CM

## 2023-12-05 DIAGNOSIS — E782 Mixed hyperlipidemia: Secondary | ICD-10-CM

## 2023-12-05 DIAGNOSIS — I25119 Atherosclerotic heart disease of native coronary artery with unspecified angina pectoris: Secondary | ICD-10-CM

## 2023-12-05 DIAGNOSIS — E11621 Type 2 diabetes mellitus with foot ulcer: Secondary | ICD-10-CM

## 2023-12-05 DIAGNOSIS — I10 Essential (primary) hypertension: Secondary | ICD-10-CM

## 2023-12-05 HISTORY — DX: Personal history of Methicillin resistant Staphylococcus aureus infection: Z86.14

## 2023-12-05 HISTORY — DX: Hyperkalemia: E87.5

## 2023-12-05 HISTORY — DX: Varus deformity, not elsewhere classified, left ankle: M21.172

## 2023-12-05 LAB — CBC WITH DIFFERENTIAL/PLATELET
Basophils Absolute: 0.1 10*3/uL (ref 0.0–0.2)
Basos: 1 %
EOS (ABSOLUTE): 0.2 10*3/uL (ref 0.0–0.4)
Eos: 2 %
Hematocrit: 41 % (ref 34.0–46.6)
Hemoglobin: 13.2 g/dL (ref 11.1–15.9)
Immature Grans (Abs): 0 10*3/uL (ref 0.0–0.1)
Immature Granulocytes: 0 %
Lymphocytes Absolute: 1.9 10*3/uL (ref 0.7–3.1)
Lymphs: 22 %
MCH: 28.6 pg (ref 26.6–33.0)
MCHC: 32.2 g/dL (ref 31.5–35.7)
MCV: 89 fL (ref 79–97)
Monocytes Absolute: 0.6 10*3/uL (ref 0.1–0.9)
Monocytes: 7 %
Neutrophils Absolute: 5.8 10*3/uL (ref 1.4–7.0)
Neutrophils: 68 %
Platelets: 254 10*3/uL (ref 150–450)
RBC: 4.61 x10E6/uL (ref 3.77–5.28)
RDW: 13.7 % (ref 11.7–15.4)
WBC: 8.5 10*3/uL (ref 3.4–10.8)

## 2023-12-05 LAB — COMPREHENSIVE METABOLIC PANEL
ALT: 14 [IU]/L (ref 0–32)
AST: 13 [IU]/L (ref 0–40)
Albumin: 4.1 g/dL (ref 3.9–4.9)
Alkaline Phosphatase: 66 [IU]/L (ref 44–121)
BUN/Creatinine Ratio: 21 (ref 12–28)
BUN: 24 mg/dL (ref 8–27)
Bilirubin Total: 0.3 mg/dL (ref 0.0–1.2)
CO2: 23 mmol/L (ref 20–29)
Calcium: 10.1 mg/dL (ref 8.7–10.3)
Chloride: 103 mmol/L (ref 96–106)
Creatinine, Ser: 1.17 mg/dL — ABNORMAL HIGH (ref 0.57–1.00)
Globulin, Total: 3 g/dL (ref 1.5–4.5)
Glucose: 190 mg/dL — ABNORMAL HIGH (ref 70–99)
Potassium: 4.9 mmol/L (ref 3.5–5.2)
Sodium: 139 mmol/L (ref 134–144)
Total Protein: 7.1 g/dL (ref 6.0–8.5)
eGFR: 52 mL/min/{1.73_m2} — ABNORMAL LOW (ref >59)

## 2023-12-05 LAB — HEMOGLOBIN A1C
Est. average glucose Bld gHb Est-mCnc: 243 mg/dL
Hgb A1c MFr Bld: 10.1 % — ABNORMAL HIGH (ref 4.8–5.6)

## 2023-12-05 MED ORDER — CEFAZOLIN SODIUM-DEXTROSE 2-4 GM/100ML-% IV SOLN
2.0000 g | INTRAVENOUS | Status: AC
Start: 2023-12-06 — End: 2023-12-06
  Administered 2023-12-06: 2 g via INTRAVENOUS

## 2023-12-05 MED ORDER — SODIUM CHLORIDE 0.9 % IV SOLN: INTRAVENOUS | Status: DC

## 2023-12-05 NOTE — Assessment & Plan Note (Addendum)
Based on the following information, patient's age and chronic problems patient is a moderate to high risk for this low/less invasive surgery.  1 MET = Can take care of self, such as eat, dress, or use the toilet.  (1) 4 METs = Can walk up a flight of steps or a hill or walk on level ground at 3 to 4 mph. (01) 4 and 10 METs = Can do heavy work around the house, such as scrubbing floors or lifting or moving heavy furniture, or climbing two flights of stairs (between) (3) >10 METs = Can participate in strenuous sports such as swimming, singles tennis, football, basketball, and skiing. (0) Total METS = 4  Revised Cardiac Risk Index for Pre-Operative Risk from [email or web address]  on 12/05/2023 ** All calculations should be rechecked by clinician prior to use **  RESULT SUMMARY: 5 points RCRI Score  15.0 % Risk of major cardiac event    High-risk surgery --> 1 = Yes History of ischemic heart disease --> 1 = Yes History of congestive heart failure --> 1 = Yes History of cerebrovascular disease --> 1 = Yes Pre-operative treatment with insulin --> 1 = Yes Pre-operative creatinine >2 mg/dL / 161.0 mol/L --> 0 = No  Gupta Perioperative Risk for Myocardial Infarction or Cardiac Arrest (MICA) from [email or web address]  on 12/05/2023 ** All calculations should be rechecked by clinician prior to use **  RESULT SUMMARY: 0.6 % Risk of myocardial infarction or cardiac arrest, intraoperatively or up to 30 days post-op   INPUTS: Age --> 62 years Functional status --> 0 = Independent ASA class --> -1.92 = 3: severe systemic disease Creatinine --> 0 = Normal (<=1.5 mg/dL, 960 mol/L)

## 2023-12-05 NOTE — Assessment & Plan Note (Signed)
Labs drawn today Lab Results  Component Value Date   HGBA1C 8.5 (H) 10/09/2023

## 2023-12-05 NOTE — Progress Notes (Signed)
Subjective:  [name], female    DOB: Feb 05, 1961  Age: 62 y.o. MRN: [id number]  Chief Complaint  Patient presents with   Pre-op Exam    HPI Patient in office today for Pre- Op. Patient is schedule to have tibialis anterior tendon LT done on 12/06/2023. Patient is feeling fine today and no concern she has stop her blood thinner 4 days ago.  Chief Complaint  Patient presents with   Pre-op Exam    Jasmine Richardson  is here for a Pre-operative physical at the request of Dr. Gala Lewandowsky.   She  is having tibialis anterior tendon transfer to better correct for the severe adductus deformity of the left foot.  Personal or family hx of adverse outcome to anesthesia? No  Chipped, cracked, missing, or loose teeth? Yes  Decreased ROM of neck? No  Able to walk up 2 flights of stairs without becoming significantly short of breath or having chest pain? No  (currently not able to walk up 2 flights of stairs due to her foot amputation, denies shortness of breath or chest pain).   Patient Active Problem List   Diagnosis Date Noted   Chronic osteomyelitis of left foot (HCC) 10/16/2023   Encounter for preoperative assessment 10/16/2023   Abnormal mammogram 08/09/2023   Acute hemorrhagic cystitis 08/09/2023   Partial nontraumatic amputation of left foot (HCC) 09/20/2022   Diabetes mellitus due to underlying condition with hyperglycemia, with long-term current use of insulin (HCC) 09/20/2022   Vitamin D deficiency 09/20/2022   Amputation of right great toe (HCC) 09/20/2022   Absolute anemia 09/20/2022   B12 deficiency 09/20/2022   Needs flu shot 09/20/2022   Colon cancer screening 09/20/2022   Intertrigo 09/20/2022   AICD (automatic cardioverter/defibrillator) present 03/02/2022   Myocardial infarction (HCC) 03/02/2022   Cancer (HCC) 02/28/2022   Bacteremia 11/22/2021   Diabetic foot infection (HCC) 11/21/2021   Abnormal stress test 10/23/2021   Abnormal EKG 10/23/2021   AKI (acute  kidney injury) (HCC) 10/23/2021   Anxiety 10/23/2021   Cellulitis of right breast 10/23/2021   Gangrene (HCC) 10/23/2021   Hematoma 10/23/2021   Hiatal hernia 10/23/2021   Hyperglycemia 10/23/2021   Hyperglycemia due to type 2 diabetes mellitus (HCC) 10/23/2021   Hyponatremia 10/23/2021   Infection of great toe 10/23/2021   Lactic acidosis 10/23/2021   Leukocytosis 10/23/2021   Obesity (BMI 30-39.9) 10/23/2021   Perineal abscess 10/23/2021   Cellulitis, wound, post-operative 10/09/2021   Abnormal mammogram of left breast 09/04/2021   Breast wound, right, subsequent encounter 09/04/2021   Demand ischemia (HCC)    ICD (implantable cardioverter-defibrillator) in place 05/16/2021   Ventricular tachycardia 12/17/2020   Unstable angina (HCC) 05/22/2020   NSTEMI (non-ST elevated myocardial infarction) (HCC) 05/22/2020   Diabetic foot ulcer (HCC) 02/16/2020   Angina pectoris (HCC) 09/11/2017   Emphysema lung (HCC) 09/11/2017   Chronic diastolic heart failure (HCC) 11/06/2016   Mediastinitis 06/28/2016   Wound, surgical, infected 06/06/2016   S/P CABG (coronary artery bypass graft) 06/03/2016   Severe sepsis (HCC) 06/03/2016   GERD (gastroesophageal reflux disease) 05/08/2016   Morbid obesity (HCC) 05/08/2016   Type 2 diabetes mellitus with foot ulcer (HCC) 05/08/2016   Sinus tachycardia 05/08/2016   Acute chest pain 05/08/2016   Burping 05/08/2016   Peripheral vascular disease (HCC) 05/08/2016   Tobacco use disorder 04/20/2016   Coronary artery disease involving native coronary artery of native heart with angina pectoris (HCC) 09/12/2015   Essential hypertension 09/12/2015  Hyperlipidemia 09/12/2015   Past Medical History:  Diagnosis Date   Abnormal EKG 10/23/2021   Abnormal mammogram of left breast 09/04/2021   Abnormal stress test 10/23/2021   Acute chest pain 05/08/2016   AICD (automatic cardioverter/defibrillator) present    St Jude/Abbott device   AKI (acute kidney  injury) (HCC) 10/23/2021   Angina pectoris (HCC) 09/11/2017   Anxiety 10/23/2021   Bacteremia 11/22/2021   Breast wound, right, subsequent encounter 09/04/2021   Burping 05/08/2016   Cancer (HCC)    cervical - hysterectomy   Cellulitis of right breast 10/23/2021   Cellulitis, wound, post-operative 10/09/2021   Chronic diastolic heart failure (HCC) 11/06/2016   Coronary artery disease involving native coronary artery of native heart with angina pectoris (HCC) 09/12/2015   Overview:  PCI and stent of RCA 2009, last cath 2012 with medical therapy  CABG May 2017   Demand ischemia [facility])    Diabetic foot infection (HCC) 11/21/2021   Diabetic foot ulcer (HCC) 02/16/2020   Emphysema lung (HCC) 09/11/2017   patient not aware of this dx   Essential hypertension 09/12/2015   Gangrene (HCC) 10/23/2021   GERD (gastroesophageal reflux disease) 05/08/2016   Hematoma 10/23/2021   Hiatal hernia 10/23/2021   Hyperglycemia 10/23/2021   Hyperglycemia due to type 2 diabetes mellitus (HCC) 10/23/2021   Hyperlipidemia 09/12/2015   Hyponatremia 10/23/2021   ICD (implantable cardioverter-defibrillator) in place 05/16/2021   Infection of great toe 10/23/2021   Lactic acidosis 10/23/2021   Leukocytosis 10/23/2021   Mediastinitis 06/28/2016   Morbid obesity (HCC) 05/08/2016   Myocardial infarction [facility])    NSTEMI (non-ST elevated myocardial infarction) (HCC) 05/22/2020   Obesity (BMI 30-39.9) 10/23/2021   Perineal abscess 10/23/2021   Peripheral vascular disease (HCC)    S/P CABG (coronary artery bypass graft) 06/03/2016   Overview:  The patient underwent sternal reconstruction on 06/21/16 with pec flaps for mediastinitis from a prior CABG in May 2017. On admission, she was critically ill from sepsis and had altered mental status. She was last seen in clinic on 08/09/16 at which time she was doing well.   Severe sepsis (HCC) 06/03/2016   Sinus tachycardia 05/08/2016   Tobacco use disorder 04/20/2016    Overview:  Quit in May 2017.   Type 2 diabetes mellitus with foot ulcer (HCC) 05/08/2016   Unstable angina (HCC) 05/22/2020   Ventricular tachycardia (HCC) 12/17/2020   Wound, surgical, infected 06/06/2016   Overview:  sternal    Past Surgical History:  Procedure Laterality Date   ABDOMINAL AORTOGRAM W/LOWER EXTREMITY N/A 10/11/2021   Procedure: ABDOMINAL AORTOGRAM W/LOWER EXTREMITY;  Surgeon: Cephus Shelling, MD;  [facility];  Service: Cardiovascular;  Laterality: N/A;   AMPUTATION TOE Right 02/20/2020   Procedure: AMPUTATION RIGHT GREAT  TOE;  Surgeon: Candelaria Stagers, DPM;  Location: [facility] OR;  Service: Podiatry;  Laterality: Right;   BLADDER SURGERY     BONE BIOPSY Left 02/01/2022   Procedure: BONE BIOPSY LEFT FOOT, IRRIGATION AND DEBRIDEMENT;  Surgeon: Asencion Islam, DPM;  [facility]: [facility];  Service: Podiatry;  Laterality: Left;   BONE BIOPSY Left 10/31/2023   Procedure: BONE BIOPSY;  Surgeon: Felecia Shelling, DPM;  Location: [facility] ORS;  Service: Orthopedics/Podiatry;  Laterality: Left;   BUBBLE STUDY  11/27/2021   Procedure: BUBBLE STUDY;  Surgeon: Pricilla Riffle, MD;  Location: [facility] ENDOSCOPY;  Service: Cardiovascular;;   CARDIAC CATHETERIZATION     CORONARY ARTERY BYPASS GRAFT     CORONARY STENT INTERVENTION  N/A 05/25/2020   Procedure: CORONARY STENT INTERVENTION;  Surgeon: Iran Ouch, MD;  [facility];  Service: Cardiovascular;  Laterality: N/A;   CORONARY STENT INTERVENTION N/A 06/22/2020   Procedure: CORONARY STENT INTERVENTION;  Surgeon: Marykay Lex, MD;  Location: [facility] INVASIVE CV LAB;  Service: Cardiovascular;  Laterality: N/A;   CORONARY ULTRASOUND/IVUS N/A 06/22/2020   Procedure: Intravascular Ultrasound/IVUS;  Surgeon: Marykay Lex, MD;  Location: [facility] INVASIVE CV LAB;  Service: Cardiovascular;  Laterality: N/A;   ICD IMPLANT N/A 12/19/2020   Procedure: ICD IMPLANT;  Surgeon: Marinus Maw, MD;  Location: [facility] INVASIVE CV LAB;  Service:  Cardiovascular;  Laterality: N/A;   INCISION AND DRAINAGE Right 02/19/2020   Procedure: INCISION AND DRAINAGE;  Surgeon: Vivi Barrack, DPM;  [facility];  Service: Podiatry;  Laterality: Right;  Block done by surgeon   INCISION AND DRAINAGE OF WOUND Left 10/31/2023   Procedure: IRRIGATION AND DEBRIDEMENT WOUND;  Surgeon: Felecia Shelling, DPM;  Location: [facility] ORS;  Service: Orthopedics/Podiatry;  Laterality: Left;   IRRIGATION AND DEBRIDEMENT FOOT Left 10/10/2021   Procedure: IRRIGATION AND DEBRIDEMENT FOOT;  Surgeon: Asencion Islam, DPM;  [facility];  Service: Podiatry;  Laterality: Left;   IRRIGATION AND DEBRIDEMENT FOOT Left 11/23/2021   Procedure: IRRIGATION AND DEBRIDEMENT FOOT;  Surgeon: Louann Sjogren, MD;  [facility];  Service: Podiatry;  Laterality: Left;  will do local block   LEFT HEART CATH AND CORS/GRAFTS ANGIOGRAPHY N/A 05/23/2020   Procedure: LEFT HEART CATH AND CORS/GRAFTS ANGIOGRAPHY;  Surgeon: Yvonne Kendall, MD;  [facility];  Service: Cardiovascular;  Laterality: N/A;   LEFT HEART CATH AND CORS/GRAFTS ANGIOGRAPHY N/A 12/19/2020   Procedure: LEFT HEART CATH AND CORS/GRAFTS ANGIOGRAPHY;  Surgeon: Kathleene Hazel, MD;  [facility];  Service: Cardiovascular;  Laterality: N/A;   LEFT HEART CATH AND CORS/GRAFTS ANGIOGRAPHY N/A 07/21/2021   Procedure: LEFT HEART CATH AND CORS/GRAFTS ANGIOGRAPHY;  Surgeon: Yvonne Kendall, MD;  [facility];  Service: Cardiovascular;  Laterality: N/A;   METATARSAL HEAD EXCISION Right 05/02/2020   Procedure: FIRST METATARSAL HEAD RESECTION; RIGHT FOOT WOUND CLOSURE;  Surgeon: Asencion Islam, DPM;  Location: [facility];  Service: Podiatry;  Laterality: Right;  MAC W/LOCAL   PERIPHERAL VASCULAR BALLOON ANGIOPLASTY Left 10/11/2021   Procedure: PERIPHERAL VASCULAR BALLOON ANGIOPLASTY;  Surgeon: Cephus Shelling, MD;  [facility];  Service:  Cardiovascular;  Laterality: Left;  Anterior Tibial Artery   TEE WITHOUT CARDIOVERSION N/A 11/27/2021   Procedure: TRANSESOPHAGEAL ECHOCARDIOGRAM (TEE);  Surgeon: Pricilla Riffle, MD;  Location: [facility] ENDOSCOPY;  Service: Cardiovascular;  Laterality: N/A;   TRANSMETATARSAL AMPUTATION Left 11/23/2021   Procedure: TRANSMETATARSAL AMPUTATION;  Surgeon: Louann Sjogren, MD;  Location: [facility] OR;  Service: Podiatry;  Laterality: Left;   TUBAL LIGATION      Current Outpatient Medications  Medication Sig Dispense Refill   acetaminophen (TYLENOL) 325 MG tablet Take 2 tablets (650 mg total) by mouth every 4 (four) hours as needed for headache or mild pain.     amiodarone (PACERONE) 200 MG tablet Take 1 tablet by mouth once daily 90 tablet 2   aspirin 81 MG chewable tablet Chew 81 mg by mouth daily.     clopidogrel (PLAVIX) 75 MG tablet Take 1 tablet (75 mg total) by mouth daily. 90 tablet 2   Evolocumab (REPATHA SURECLICK) 140 MG/ML SOAJ Inject 140 mg into the skin every 14 (fourteen)  days. 2 mL 1   FARXIGA 10 MG TABS tablet Take 1 tablet by mouth once daily 90 tablet 0   furosemide (LASIX) 20 MG tablet Take 1 tablet (20 mg total) by mouth daily. (Patient taking differently: Take 20 mg by mouth every other day.) 90 tablet 3   HUMALOG KWIKPEN 100 UNIT/ML KwikPen Inject 10-50 Units into the skin at bedtime. Sliding scale 15 mL 12   LORazepam (ATIVAN) 1 MG tablet Take 1 tablet (1 mg total) by mouth as needed for anxiety. 30 tablet 0   metFORMIN (GLUCOPHAGE) 500 MG tablet TAKE 1 TABLET BY MOUTH TWICE DAILY WITH A MEAL 180 tablet 0   metoprolol succinate (TOPROL-XL) 25 MG 24 hr tablet Take 1 tablet (25 mg total) by mouth in the morning, at noon, and at bedtime. 270 tablet 2   nitroGLYCERIN (NITROSTAT) 0.4 MG SL tablet Place 1 tablet (0.4 mg total) under the tongue every 5 (five) minutes as needed for chest pain. 25 tablet 6   nystatin (MYCOSTATIN/NYSTOP) powder Apply 1 Application topically daily as needed  (rash/yeast). 15 g 2   Omega-3 Fatty Acids (FISH OIL) 500 MG CAPS Take 500 mg by mouth at bedtime.     omeprazole (PRILOSEC) 20 MG capsule Take 1 capsule (20 mg total) by mouth daily. 90 capsule 1   promethazine (PHENERGAN) 25 MG tablet Take 25 mg by mouth every 6 (six) hours as needed for nausea or vomiting.     rosuvastatin (CRESTOR) 20 MG tablet Take 1 tablet (20 mg total) by mouth daily. 90 tablet 2   senna-docusate (SENOKOT-S) 8.6-50 MG tablet Take 1 tablet by mouth every other day.     TOUJEO MAX SOLOSTAR 300 UNIT/ML Solostar Pen INJECT 50 UNITS SUBCUTANEOUSLY ONCE DAILY (Patient taking differently: Inject 59 Units into the skin in the morning.) 12 mL 0   valsartan (DIOVAN) 80 MG tablet Take 1 tablet (80 mg total) by mouth 2 (two) times daily. 180 tablet 2   Vitamin D, Ergocalciferol, (DRISDOL) 1.25 MG (50000 UNIT) CAPS capsule TAKE 1 CAPSULE BY MOUTH EVERY SATURDAY 12 capsule 0   No current facility-administered medications for this visit.    Allergies  Allergen Reactions   Vancomycin Anaphylaxis   Chlorhexidine Gluconate Itching    Received CHG bath, began itching, required benadryl   Cat Hair Extract Other (See Comments)    Sneezing, watery eyes.   Tramadol Other (See Comments)    Hallucinations   Codeine Rash    Social History   Socioeconomic History   Marital status: Married    Spouse name: Not on file   Number of children: Not on file   Years of education: Not on file   Highest education level: Not on file  Occupational History   Not on file  Tobacco Use   Smoking status: Former    Current packs/day: 0.00    Types: Cigarettes    Quit date: 05/2016    Years since quitting: 7.5   Smokeless tobacco: Never  Vaping Use   Vaping status: Never Used  Substance and Sexual Activity   Alcohol use: Yes    Comment: rare   Drug use: No   Sexual activity: Yes    Birth control/protection: Surgical    Comment: Hysterectomy  Other Topics Concern   Not on file  Social  History Narrative   Not on file   Social Drivers of Health   Financial Resource Strain: Low Risk  (10/16/2023)   Overall Financial Resource Strain (CARDIA)  Difficulty of Paying Living Expenses: Not hard at all  Food Insecurity: No Food Insecurity (10/16/2023)   Hunger Vital Sign    Worried About Running Out of Food in the Last Year: Never true    Ran Out of Food in the Last Year: Never true  Transportation Needs: No Transportation Needs (10/16/2023)   PRAPARE - Administrator, Civil Service (Medical): No    Lack of Transportation (Non-Medical): No  Physical Activity: Inactive (10/16/2023)   Exercise Vital Sign    Days of Exercise per Week: 0 days    Minutes of Exercise per Session: 0 min  Stress: No Stress Concern Present (10/16/2023)   [employer] of Occupational Health - Occupational Stress Questionnaire    Feeling of Stress : Not at all  Social Connections: Socially Integrated (10/16/2023)   Social Connection and Isolation Panel [NHANES]    Frequency of Communication with Friends and Family: More than three times a week    Frequency of Social Gatherings with Friends and Family: More than three times a week    Attends Religious Services: More than 4 times per year    Active Member of Clubs or Organizations: Yes    Attends Banker Meetings: More than 4 times per year    Marital Status: Married  Catering manager Violence: Not At Risk (10/16/2023)   Humiliation, Afraid, Rape, and Kick questionnaire    Fear of Current or Ex-Partner: No    Emotionally Abused: No    Physically Abused: No    Sexually Abused: No    Family History  Problem Relation Age of Onset   Hypertension Mother    Hyperlipidemia Mother    Heart attack Father    Heart disease Father    Hypertension Father    Alzheimer's disease Father    Hypertension Brother    Hyperlipidemia Brother          10/16/2023    2:11 PM 08/09/2023   10:50 AM 03/19/2023    9:53 AM 09/20/2022    11:17 AM 03/15/2022    3:05 PM  Depression screen PHQ 2/9  Decreased Interest 0 0 0 0 0  Down, Depressed, Hopeless 0 0 1 0 0  PHQ - 2 Score 0 0 1 0 0  Altered sleeping 1 0 1 3   Tired, decreased energy 1 0 1 0   Change in appetite 0 0 0 0   Feeling bad or failure about yourself  0 0 0 0   Trouble concentrating 0 0 0 0   Moving slowly or fidgety/restless 0 0 0 0   Suicidal thoughts 0 0 0 0   PHQ-9 Score 2 0 3 3   Difficult doing work/chores Not difficult at all Not difficult at all Not difficult at all Not difficult at all         10/16/2023    2:10 PM  Fall Risk   Falls in the past year? 0  Number falls in past yr: 0  Injury with Fall? 0  Risk for fall due to : No Fall Risks  Follow up Falls evaluation completed    Patient Care Team: Marianne Sofia, PA-C as PCP - General (Physician Assistant) Baldo Daub, MD as PCP - Cardiology (Cardiology)   Review of Systems  Constitutional:  Negative for fatigue.  HENT:  Negative for congestion, ear pain and sore throat.   Respiratory:  Negative for cough and shortness of breath.   Cardiovascular:  Negative for chest  pain.  Gastrointestinal:  Positive for constipation. Negative for abdominal pain, diarrhea, nausea and vomiting.  Genitourinary:  Negative for dysuria, frequency and urgency.  Musculoskeletal:  Negative for arthralgias, back pain and myalgias.       Previous left foot surgery 2 weeks ago, 2/10 pain  Neurological:  Negative for dizziness and headaches.  Psychiatric/Behavioral:  Positive for sleep disturbance. Negative for agitation. The patient is nervous/anxious.     Current Outpatient Medications on File Prior to Visit  Medication Sig Dispense Refill   acetaminophen (TYLENOL) 325 MG tablet Take 2 tablets (650 mg total) by mouth every 4 (four) hours as needed for headache or mild pain.     amiodarone (PACERONE) 200 MG tablet Take 1 tablet by mouth once daily 90 tablet 2   aspirin 81 MG chewable tablet Chew 81 mg by  mouth daily.     clopidogrel (PLAVIX) 75 MG tablet Take 1 tablet (75 mg total) by mouth daily. 90 tablet 2   Evolocumab (REPATHA SURECLICK) 140 MG/ML SOAJ Inject 140 mg into the skin every 14 (fourteen) days. 2 mL 1   FARXIGA 10 MG TABS tablet Take 1 tablet by mouth once daily 90 tablet 0   furosemide (LASIX) 20 MG tablet Take 1 tablet (20 mg total) by mouth daily. (Patient taking differently: Take 20 mg by mouth every other day.) 90 tablet 3   HUMALOG KWIKPEN 100 UNIT/ML KwikPen Inject 10-50 Units into the skin at bedtime. Sliding scale 15 mL 12   LORazepam (ATIVAN) 1 MG tablet Take 1 tablet (1 mg total) by mouth as needed for anxiety. 30 tablet 0   metFORMIN (GLUCOPHAGE) 500 MG tablet TAKE 1 TABLET BY MOUTH TWICE DAILY WITH A MEAL 180 tablet 0   metoprolol succinate (TOPROL-XL) 25 MG 24 hr tablet Take 1 tablet (25 mg total) by mouth in the morning, at noon, and at bedtime. 270 tablet 2   nitroGLYCERIN (NITROSTAT) 0.4 MG SL tablet Place 1 tablet (0.4 mg total) under the tongue every 5 (five) minutes as needed for chest pain. 25 tablet 6   nystatin (MYCOSTATIN/NYSTOP) powder Apply 1 Application topically daily as needed (rash/yeast). 15 g 2   Omega-3 Fatty Acids (FISH OIL) 500 MG CAPS Take 500 mg by mouth at bedtime.     omeprazole (PRILOSEC) 20 MG capsule Take 1 capsule (20 mg total) by mouth daily. 90 capsule 1   promethazine (PHENERGAN) 25 MG tablet Take 25 mg by mouth every 6 (six) hours as needed for nausea or vomiting.     rosuvastatin (CRESTOR) 20 MG tablet Take 1 tablet (20 mg total) by mouth daily. 90 tablet 2   senna-docusate (SENOKOT-S) 8.6-50 MG tablet Take 1 tablet by mouth every other day.     TOUJEO MAX SOLOSTAR 300 UNIT/ML Solostar Pen INJECT 50 UNITS SUBCUTANEOUSLY ONCE DAILY (Patient taking differently: Inject 59 Units into the skin in the morning.) 12 mL 0   valsartan (DIOVAN) 80 MG tablet Take 1 tablet (80 mg total) by mouth 2 (two) times daily. 180 tablet 2   Vitamin D,  Ergocalciferol, (DRISDOL) 1.25 MG (50000 UNIT) CAPS capsule TAKE 1 CAPSULE BY MOUTH EVERY SATURDAY 12 capsule 0   No current facility-administered medications on file prior to visit.   Past Medical History:  Diagnosis Date   Abnormal EKG 10/23/2021   Abnormal mammogram of left breast 09/04/2021   Abnormal stress test 10/23/2021   Acute chest pain 05/08/2016   AICD (automatic cardioverter/defibrillator) present    St Jude/Abbott  device   AKI (acute kidney injury) (HCC) 10/23/2021   Angina pectoris (HCC) 09/11/2017   Anxiety 10/23/2021   Bacteremia 11/22/2021   Breast wound, right, subsequent encounter 09/04/2021   Burping 05/08/2016   Cancer (HCC)    cervical - hysterectomy   Cellulitis of right breast 10/23/2021   Cellulitis, wound, post-operative 10/09/2021   Chronic diastolic heart failure (HCC) 11/06/2016   Coronary artery disease involving native coronary artery of native heart with angina pectoris (HCC) 09/12/2015   Overview:  PCI and stent of RCA 2009, last cath 2012 with medical therapy  CABG May 2017   Demand ischemia [facility])    Diabetic foot infection (HCC) 11/21/2021   Diabetic foot ulcer (HCC) 02/16/2020   Emphysema lung (HCC) 09/11/2017   patient not aware of this dx   Essential hypertension 09/12/2015   Gangrene (HCC) 10/23/2021   GERD (gastroesophageal reflux disease) 05/08/2016   Hematoma 10/23/2021   Hiatal hernia 10/23/2021   Hyperglycemia 10/23/2021   Hyperglycemia due to type 2 diabetes mellitus (HCC) 10/23/2021   Hyperlipidemia 09/12/2015   Hyponatremia 10/23/2021   ICD (implantable cardioverter-defibrillator) in place 05/16/2021   Infection of great toe 10/23/2021   Lactic acidosis 10/23/2021   Leukocytosis 10/23/2021   Mediastinitis 06/28/2016   Morbid obesity (HCC) 05/08/2016   Myocardial infarction [facility])    NSTEMI (non-ST elevated myocardial infarction) (HCC) 05/22/2020   Obesity (BMI 30-39.9) 10/23/2021   Perineal abscess 10/23/2021   Peripheral  vascular disease (HCC)    S/P CABG (coronary artery bypass graft) 06/03/2016   Overview:  The patient underwent sternal reconstruction on 06/21/16 with pec flaps for mediastinitis from a prior CABG in May 2017. On admission, she was critically ill from sepsis and had altered mental status. She was last seen in clinic on 08/09/16 at which time she was doing well.   Severe sepsis (HCC) 06/03/2016   Sinus tachycardia 05/08/2016   Tobacco use disorder 04/20/2016   Overview:  Quit in May 2017.   Type 2 diabetes mellitus with foot ulcer (HCC) 05/08/2016   Unstable angina (HCC) 05/22/2020   Ventricular tachycardia (HCC) 12/17/2020   Wound, surgical, infected 06/06/2016   Overview:  sternal   Past Surgical History:  Procedure Laterality Date   ABDOMINAL AORTOGRAM W/LOWER EXTREMITY N/A 10/11/2021   Procedure: ABDOMINAL AORTOGRAM W/LOWER EXTREMITY;  Surgeon: Cephus Shelling, MD;  [facility];  Service: Cardiovascular;  Laterality: N/A;   AMPUTATION TOE Right 02/20/2020   Procedure: AMPUTATION RIGHT GREAT  TOE;  Surgeon: Candelaria Stagers, DPM;  Location: [facility] OR;  Service: Podiatry;  Laterality: Right;   BLADDER SURGERY     BONE BIOPSY Left 02/01/2022   Procedure: BONE BIOPSY LEFT FOOT, IRRIGATION AND DEBRIDEMENT;  Surgeon: Asencion Islam, DPM;  Location: [facility] OR;  Service: Podiatry;  Laterality: Left;   BONE BIOPSY Left 10/31/2023   Procedure: BONE BIOPSY;  Surgeon: Felecia Shelling, DPM;  Location: [facility] ORS;  Service: Orthopedics/Podiatry;  Laterality: Left;   BUBBLE STUDY  11/27/2021   Procedure: BUBBLE STUDY;  Surgeon: Pricilla Riffle, MD;  Location: [facility] ENDOSCOPY;  Service: Cardiovascular;;   CARDIAC CATHETERIZATION     CORONARY ARTERY BYPASS GRAFT     CORONARY STENT INTERVENTION N/A 05/25/2020   Procedure: CORONARY STENT INTERVENTION;  Surgeon: Iran Ouch, MD;  [facility];  Service: Cardiovascular;  Laterality: N/A;   CORONARY STENT INTERVENTION N/A 06/22/2020    Procedure: CORONARY STENT INTERVENTION;  Surgeon: Marykay Lex, MD;  [facility];  Service: Cardiovascular;  Laterality: N/A;   CORONARY ULTRASOUND/IVUS N/A 06/22/2020   Procedure: Intravascular Ultrasound/IVUS;  Surgeon: Marykay Lex, MD;  Location: [facility] INVASIVE CV LAB;  Service: Cardiovascular;  Laterality: N/A;   ICD IMPLANT N/A 12/19/2020   Procedure: ICD IMPLANT;  Surgeon: Marinus Maw, MD;  Location: [facility] INVASIVE CV LAB;  Service: Cardiovascular;  Laterality: N/A;   INCISION AND DRAINAGE Right 02/19/2020   Procedure: INCISION AND DRAINAGE;  Surgeon: Vivi Barrack, DPM;  Location: [facility] OR;  Service: Podiatry;  Laterality: Right;  Block done by surgeon   INCISION AND DRAINAGE OF WOUND Left 10/31/2023   Procedure: IRRIGATION AND DEBRIDEMENT WOUND;  Surgeon: Felecia Shelling, DPM;  Location: [facility];  Service: Orthopedics/Podiatry;  Laterality: Left;   IRRIGATION AND DEBRIDEMENT FOOT Left 10/10/2021   Procedure: IRRIGATION AND DEBRIDEMENT FOOT;  Surgeon: Asencion Islam, DPM;  Location: [facility];  Service: Podiatry;  Laterality: Left;   IRRIGATION AND DEBRIDEMENT FOOT Left 11/23/2021   Procedure: IRRIGATION AND DEBRIDEMENT FOOT;  Surgeon: Louann Sjogren, MD;  Location: [facility];  Service: Podiatry;  Laterality: Left;  will do local block   LEFT HEART CATH AND CORS/GRAFTS ANGIOGRAPHY N/A 05/23/2020   Procedure: LEFT HEART CATH AND CORS/GRAFTS ANGIOGRAPHY;  Surgeon: Yvonne Kendall, MD;  Location: [facility];  Service: Cardiovascular;  Laterality: N/A;   LEFT HEART CATH AND CORS/GRAFTS ANGIOGRAPHY N/A 12/19/2020   Procedure: LEFT HEART CATH AND CORS/GRAFTS ANGIOGRAPHY;  Surgeon: Kathleene Hazel, MD;  Location: [facility];  Service: Cardiovascular;  Laterality: N/A;   LEFT HEART CATH AND CORS/GRAFTS ANGIOGRAPHY N/A 07/21/2021   Procedure: LEFT HEART CATH AND CORS/GRAFTS ANGIOGRAPHY;  Surgeon: Yvonne Kendall, MD;  Location: [facility];  Service:  Cardiovascular;  Laterality: N/A;   METATARSAL HEAD EXCISION Right 05/02/2020   Procedure: FIRST METATARSAL HEAD RESECTION; RIGHT FOOT WOUND CLOSURE;  Surgeon: Asencion Islam, DPM;  Location: [facility];  Service: Podiatry;  Laterality: Right;  MAC W/LOCAL   PERIPHERAL VASCULAR BALLOON ANGIOPLASTY Left 10/11/2021   Procedure: PERIPHERAL VASCULAR BALLOON ANGIOPLASTY;  Surgeon: Cephus Shelling, MD;  Location: [facility];  Service: Cardiovascular;  Laterality: Left;  Anterior Tibial Artery   TEE WITHOUT CARDIOVERSION N/A 11/27/2021   Procedure: TRANSESOPHAGEAL ECHOCARDIOGRAM (TEE);  Surgeon: Pricilla Riffle, MD;  Location[facility];  Service: Cardiovascular;  Laterality: N/A;   TRANSMETATARSAL AMPUTATION Left 11/23/2021   Procedure: TRANSMETATARSAL AMPUTATION;  Surgeon: Louann Sjogren, MD;  Location: [facility];  Service: Podiatry;  Laterality: Left;   TUBAL LIGATION      Family History  Problem Relation Age of Onset   Hypertension Mother    Hyperlipidemia Mother    Heart attack Father    Heart disease Father    Hypertension Father    Alzheimer's disease Father    Hypertension Brother    Hyperlipidemia Brother    Social History   Socioeconomic History   Marital status: Married    Spouse name: Not on file   Number of children: Not on file   Years of education: Not on file   Highest education level: Not on file  Occupational History   Not on file  Tobacco Use   Smoking status: Former    Current packs/day: 0.00    Types: Cigarettes    Quit date: 05/2016    Years since quitting: 7.5   Smokeless tobacco: Never  Vaping Use   Vaping status: Never Used  Substance  and Sexual Activity   Alcohol use: Yes    Comment: rare   Drug use: No   Sexual activity: Yes    Birth control/protection: Surgical    Comment: Hysterectomy  Other Topics Concern   Not on file  Social History Narrative   Not on file   Social Drivers of Health   Financial Resource Strain:  Low Risk  (10/16/2023)   Overall Financial Resource Strain (CARDIA)    Difficulty of Paying Living Expenses: Not hard at all  Food Insecurity: No Food Insecurity (10/16/2023)   Hunger Vital Sign    Worried About Running Out of Food in the Last Year: Never true    Ran Out of Food in the Last Year: Never true  Transportation Needs: No Transportation Needs (10/16/2023)   PRAPARE - Administrator, Civil Service (Medical): No    Lack of Transportation (Non-Medical): No  Physical Activity: Inactive (10/16/2023)   Exercise Vital Sign    Days of Exercise per Week: 0 days    Minutes of Exercise per Session: 0 min  Stress: No Stress Concern Present (10/16/2023)   [employer] of Occupational Health - Occupational Stress Questionnaire    Feeling of Stress : Not at all  Social Connections: Socially Integrated (10/16/2023)   Social Connection and Isolation Panel [NHANES]    Frequency of Communication with Friends and Family: More than three times a week    Frequency of Social Gatherings with Friends and Family: More than three times a week    Attends Religious Services: More than 4 times per year    Active Member of [employer] or Organizations: Yes    Attends Engineer, structural: More than 4 times per year    Marital Status: Married    Objective:  BP 120/74 (BP Location: Left Arm, Patient Position: Sitting, Cuff Size: Large)   Pulse 92   Temp (!) 97.2 F (36.2 C) (Temporal)   Resp 20   Ht 5\' 7"  (1.702 m)   Wt 220 lb 9.6 oz (100.1 kg)   LMP  (LMP Unknown)   BMI 34.55 kg/m      12/05/2023    7:33 AM 10/31/2023    2:45 PM 10/31/2023    2:30 PM  BP/Weight  Systolic BP 120 156 129  Diastolic BP 74 83 78  Wt. (Lbs) 220.6    BMI 34.55 kg/m2      Physical Exam Vitals reviewed.  Constitutional:      General: She is not in acute distress.    Appearance: Normal appearance.  HENT:     Head: Normocephalic.     Right Ear: Tympanic membrane normal.     Left Ear:  Tympanic membrane normal.     Nose: Nose normal. No congestion.     Mouth/Throat:     Pharynx: No posterior oropharyngeal erythema.  Eyes:     Conjunctiva/sclera: Conjunctivae normal.  Neck:     Vascular: No carotid bruit.  Cardiovascular:     Rate and Rhythm: Normal rate and regular rhythm.     Heart sounds: Normal heart sounds.  Pulmonary:     Effort: Pulmonary effort is normal.     Breath sounds: Normal breath sounds. No wheezing.  Abdominal:     General: Bowel sounds are normal.     Palpations: Abdomen is soft.     Tenderness: There is no abdominal tenderness.  Musculoskeletal:        General: Normal range of motion.  Cervical back: Normal range of motion.     Right Lower Extremity: (right  great toe amputated)    Left Lower Extremity: Left leg is amputated below ankle. (toes removed) Feet:     Right foot:     Skin integrity: Dry skin present.     Left foot:     Skin integrity: Dry skin present.  Skin:    General: Skin is warm and dry.  Neurological:     Mental Status: She is alert. Mental status is at baseline.     Motor: No weakness.  Psychiatric:        Mood and Affect: Mood normal.        Behavior: Behavior normal.        Diabetic Foot Exam - Simple   Simple Foot Form Visual Inspection See comments: Yes Sensation Testing Intact to touch and monofilament testing bilaterally: Yes Pulse Check Posterior Tibialis and Dorsalis pulse intact bilaterally: Yes Comments Left foot toes removed, surgery tomorrow to repair tendon. Right foot big toe removed.      Lab Results  Component Value Date   WBC 8.7 10/09/2023   HGB 12.9 10/09/2023   HCT 39.6 10/09/2023   PLT 256 10/09/2023   GLUCOSE 276 (H) 10/09/2023   CHOL 216 (H) 08/09/2023   TRIG 248 (H) 08/09/2023   HDL 66 08/09/2023   LDLDIRECT 91.4 05/22/2020   LDLCALC 108 (H) 08/09/2023   ALT 14 08/21/2023   AST 14 08/21/2023   NA 140 10/09/2023   K 4.6 10/09/2023   CL 108 10/09/2023   CREATININE  0.87 10/09/2023   BUN 23 10/09/2023   CO2 24 10/09/2023   TSH 1.850 08/09/2023   INR 1.1 07/20/2021   HGBA1C 8.5 (H) 10/09/2023   MICROALBUR 293.5 (H) 07/21/2021      Assessment & Plan:    Encounter for preoperative assessment Assessment & Plan: Based on the following information, patient's age and chronic problems patient is a moderate to high risk for this low/less invasive surgery.  1 MET = Can take care of self, such as eat, dress, or use the toilet.  (1) 4 METs = Can walk up a flight of steps or a hill or walk on level ground at 3 to 4 mph. (01) 4 and 10 METs = Can do heavy work around the house, such as scrubbing floors or lifting or moving heavy furniture, or climbing two flights of stairs (between) (3) >10 METs = Can participate in strenuous sports such as swimming, singles tennis, football, basketball, and skiing. (0) Total METS = 4  Revised Cardiac Risk Index for Pre-Operative Risk from [email or web address]  on 12/05/2023 ** All calculations should be rechecked by clinician prior to use **  RESULT SUMMARY: 5 points RCRI Score  15.0 % Risk of major cardiac event    High-risk surgery --> 1 = Yes History of ischemic heart disease --> 1 = Yes History of congestive heart failure --> 1 = Yes History of cerebrovascular disease --> 1 = Yes Pre-operative treatment with insulin --> 1 = Yes Pre-operative creatinine >2 mg/dL / 811.9 mol/L --> 0 = No  Gupta Perioperative Risk for Myocardial Infarction or Cardiac Arrest (MICA) from [email or web address]  on 12/05/2023 ** All calculations should be rechecked by clinician prior to use **  RESULT SUMMARY: 0.6 % Risk of myocardial infarction or cardiac arrest, intraoperatively or up to 30 days post-op   INPUTS: Age --> 62 years Functional status --> 0 = Independent ASA class --> -1.92 =  3: severe systemic disease Creatinine --> 0 = Normal (<=1.5 mg/dL, 086 mol/L)      Diabetic ulcer of left heel associated with type 2 diabetes  mellitus, with fat layer exposed (HCC) Assessment & Plan: Labs drawn today Lab Results  Component Value Date   HGBA1C 8.5 (H) 10/09/2023     Orders: -     Hemoglobin A1c  Mixed hyperlipidemia Assessment & Plan: Stable Lab Results  Component Value Date   LDLCALC 108 (H) 08/09/2023  On Crestor 20 mg and omega fish oil 500 mg @ bedtime     Essential hypertension Assessment & Plan: Well controlled BP Readings from Last 3 Encounters:  12/05/23 120/74  10/31/23 (!) 156/83  10/16/23 120/70  Continue all medications as directed.Metoprolol 25 mg by mouth THREE TIMES A DAY  Remain hydrated and follow mediterranean diet. Resume physical activity to continue decreasing BMI Continue to abstain from tobacco/vape/excessive ETOH use Labs drawn today   Orders: -     CBC with Differential/Platelet -     Comprehensive metabolic panel  Coronary artery disease involving native coronary artery of native heart with angina pectoris (HCC) Assessment & Plan: Stable  On Aspirin 81 mg daily, Plavix 75 mg daily, lasix 20 mg daily, nitroglycerin SL tab 0.4 mg PRN, and valsartan 80 mg by mouth TWICE A DAY  Labs drawn today  Per Dr. Dulce Sellar regarding her last surgery advised that she is at acceptable risk and no changes are symptoms since last visit present.   "Chart reviewed as part of pre-operative protocol coverage. Given past medical history and time since last visit, based on ACC/AHA guidelines, Ziyan Borga is at acceptable risk for the planned procedure without further cardiovascular testing.    The patient was advised that if she develops new symptoms prior to surgery to contact our office to arrange for a follow-up visit, and she verbalized understanding.   Per protocol patient can hold ASA and Plavix 5 to 7 days prior to procedure and should restart postprocedure when surgically safe and hemostasis is achieved."        No orders of the defined types were placed in this  encounter.   Orders Placed This Encounter  Procedures   CBC with Differential   Comprehensive metabolic panel   Hemoglobin A1c     Follow-up: Return in about 3 months (around 03/04/2024) for chronic.   I,Jacqua L Marsh,acting as a scribe for Renne Crigler, FNP.,have documented all relevant documentation on the behalf of Renne Crigler, FNP,as directed by  Renne Crigler, FNP while in the presence of Renne Crigler, FNP.   An After Visit Summary was printed and given to the patient.  Total time spent on today's visit was 48 minutes, including both face-to-face time and nonface-to-face time personally spent on review of chart (labs and imaging), discussing labs and goals, discussing further work-up, treatment options, referrals to specialist if needed, reviewing outside records if pertinent, answering patient's questions, and coordinating care.   Lajuana Matte, FNP [facility] [phone]

## 2023-12-05 NOTE — Assessment & Plan Note (Signed)
Well controlled BP Readings from Last 3 Encounters:  12/05/23 120/74  10/31/23 (!) 156/83  10/16/23 120/70  Continue all medications as directed.Metoprolol 25 mg by mouth THREE TIMES A DAY  Remain hydrated and follow mediterranean diet. Resume physical activity to continue decreasing BMI Continue to abstain from tobacco/vape/excessive ETOH use Labs drawn today

## 2023-12-05 NOTE — H&P (View-Only) (Signed)
 Subjective:  [name], female    DOB: Feb 05, 1961  Age: 62 y.o. MRN: [id number]  Chief Complaint  Patient presents with   Pre-op Exam    HPI Patient in office today for Pre- Op. Patient is schedule to have tibialis anterior tendon LT done on 12/06/2023. Patient is feeling fine today and no concern she has stop her blood thinner 4 days ago.  Chief Complaint  Patient presents with   Pre-op Exam    Jasmine Richardson  is here for a Pre-operative physical at the request of Dr. Gala Lewandowsky.   She  is having tibialis anterior tendon transfer to better correct for the severe adductus deformity of the left foot.  Personal or family hx of adverse outcome to anesthesia? No  Chipped, cracked, missing, or loose teeth? Yes  Decreased ROM of neck? No  Able to walk up 2 flights of stairs without becoming significantly short of breath or having chest pain? No  (currently not able to walk up 2 flights of stairs due to her foot amputation, denies shortness of breath or chest pain).   Patient Active Problem List   Diagnosis Date Noted   Chronic osteomyelitis of left foot (HCC) 10/16/2023   Encounter for preoperative assessment 10/16/2023   Abnormal mammogram 08/09/2023   Acute hemorrhagic cystitis 08/09/2023   Partial nontraumatic amputation of left foot (HCC) 09/20/2022   Diabetes mellitus due to underlying condition with hyperglycemia, with long-term current use of insulin (HCC) 09/20/2022   Vitamin D deficiency 09/20/2022   Amputation of right great toe (HCC) 09/20/2022   Absolute anemia 09/20/2022   B12 deficiency 09/20/2022   Needs flu shot 09/20/2022   Colon cancer screening 09/20/2022   Intertrigo 09/20/2022   AICD (automatic cardioverter/defibrillator) present 03/02/2022   Myocardial infarction (HCC) 03/02/2022   Cancer (HCC) 02/28/2022   Bacteremia 11/22/2021   Diabetic foot infection (HCC) 11/21/2021   Abnormal stress test 10/23/2021   Abnormal EKG 10/23/2021   AKI (acute  kidney injury) (HCC) 10/23/2021   Anxiety 10/23/2021   Cellulitis of right breast 10/23/2021   Gangrene (HCC) 10/23/2021   Hematoma 10/23/2021   Hiatal hernia 10/23/2021   Hyperglycemia 10/23/2021   Hyperglycemia due to type 2 diabetes mellitus (HCC) 10/23/2021   Hyponatremia 10/23/2021   Infection of great toe 10/23/2021   Lactic acidosis 10/23/2021   Leukocytosis 10/23/2021   Obesity (BMI 30-39.9) 10/23/2021   Perineal abscess 10/23/2021   Cellulitis, wound, post-operative 10/09/2021   Abnormal mammogram of left breast 09/04/2021   Breast wound, right, subsequent encounter 09/04/2021   Demand ischemia (HCC)    ICD (implantable cardioverter-defibrillator) in place 05/16/2021   Ventricular tachycardia 12/17/2020   Unstable angina (HCC) 05/22/2020   NSTEMI (non-ST elevated myocardial infarction) (HCC) 05/22/2020   Diabetic foot ulcer (HCC) 02/16/2020   Angina pectoris (HCC) 09/11/2017   Emphysema lung (HCC) 09/11/2017   Chronic diastolic heart failure (HCC) 11/06/2016   Mediastinitis 06/28/2016   Wound, surgical, infected 06/06/2016   S/P CABG (coronary artery bypass graft) 06/03/2016   Severe sepsis (HCC) 06/03/2016   GERD (gastroesophageal reflux disease) 05/08/2016   Morbid obesity (HCC) 05/08/2016   Type 2 diabetes mellitus with foot ulcer (HCC) 05/08/2016   Sinus tachycardia 05/08/2016   Acute chest pain 05/08/2016   Burping 05/08/2016   Peripheral vascular disease (HCC) 05/08/2016   Tobacco use disorder 04/20/2016   Coronary artery disease involving native coronary artery of native heart with angina pectoris (HCC) 09/12/2015   Essential hypertension 09/12/2015  Hyperlipidemia 09/12/2015   Past Medical History:  Diagnosis Date   Abnormal EKG 10/23/2021   Abnormal mammogram of left breast 09/04/2021   Abnormal stress test 10/23/2021   Acute chest pain 05/08/2016   AICD (automatic cardioverter/defibrillator) present    St Jude/Abbott device   AKI (acute kidney  injury) (HCC) 10/23/2021   Angina pectoris (HCC) 09/11/2017   Anxiety 10/23/2021   Bacteremia 11/22/2021   Breast wound, right, subsequent encounter 09/04/2021   Burping 05/08/2016   Cancer (HCC)    cervical - hysterectomy   Cellulitis of right breast 10/23/2021   Cellulitis, wound, post-operative 10/09/2021   Chronic diastolic heart failure (HCC) 11/06/2016   Coronary artery disease involving native coronary artery of native heart with angina pectoris (HCC) 09/12/2015   Overview:  PCI and stent of RCA 2009, last cath 2012 with medical therapy  CABG May 2017   Demand ischemia [facility])    Diabetic foot infection (HCC) 11/21/2021   Diabetic foot ulcer (HCC) 02/16/2020   Emphysema lung (HCC) 09/11/2017   patient not aware of this dx   Essential hypertension 09/12/2015   Gangrene (HCC) 10/23/2021   GERD (gastroesophageal reflux disease) 05/08/2016   Hematoma 10/23/2021   Hiatal hernia 10/23/2021   Hyperglycemia 10/23/2021   Hyperglycemia due to type 2 diabetes mellitus (HCC) 10/23/2021   Hyperlipidemia 09/12/2015   Hyponatremia 10/23/2021   ICD (implantable cardioverter-defibrillator) in place 05/16/2021   Infection of great toe 10/23/2021   Lactic acidosis 10/23/2021   Leukocytosis 10/23/2021   Mediastinitis 06/28/2016   Morbid obesity (HCC) 05/08/2016   Myocardial infarction [facility])    NSTEMI (non-ST elevated myocardial infarction) (HCC) 05/22/2020   Obesity (BMI 30-39.9) 10/23/2021   Perineal abscess 10/23/2021   Peripheral vascular disease (HCC)    S/P CABG (coronary artery bypass graft) 06/03/2016   Overview:  The patient underwent sternal reconstruction on 06/21/16 with pec flaps for mediastinitis from a prior CABG in May 2017. On admission, she was critically ill from sepsis and had altered mental status. She was last seen in clinic on 08/09/16 at which time she was doing well.   Severe sepsis (HCC) 06/03/2016   Sinus tachycardia 05/08/2016   Tobacco use disorder 04/20/2016    Overview:  Quit in May 2017.   Type 2 diabetes mellitus with foot ulcer (HCC) 05/08/2016   Unstable angina (HCC) 05/22/2020   Ventricular tachycardia (HCC) 12/17/2020   Wound, surgical, infected 06/06/2016   Overview:  sternal    Past Surgical History:  Procedure Laterality Date   ABDOMINAL AORTOGRAM W/LOWER EXTREMITY N/A 10/11/2021   Procedure: ABDOMINAL AORTOGRAM W/LOWER EXTREMITY;  Surgeon: Cephus Shelling, MD;  [facility];  Service: Cardiovascular;  Laterality: N/A;   AMPUTATION TOE Right 02/20/2020   Procedure: AMPUTATION RIGHT GREAT  TOE;  Surgeon: Candelaria Stagers, DPM;  Location: [facility] OR;  Service: Podiatry;  Laterality: Right;   BLADDER SURGERY     BONE BIOPSY Left 02/01/2022   Procedure: BONE BIOPSY LEFT FOOT, IRRIGATION AND DEBRIDEMENT;  Surgeon: Asencion Islam, DPM;  [facility]: [facility];  Service: Podiatry;  Laterality: Left;   BONE BIOPSY Left 10/31/2023   Procedure: BONE BIOPSY;  Surgeon: Felecia Shelling, DPM;  Location: [facility] ORS;  Service: Orthopedics/Podiatry;  Laterality: Left;   BUBBLE STUDY  11/27/2021   Procedure: BUBBLE STUDY;  Surgeon: Pricilla Riffle, MD;  Location: [facility] ENDOSCOPY;  Service: Cardiovascular;;   CARDIAC CATHETERIZATION     CORONARY ARTERY BYPASS GRAFT     CORONARY STENT INTERVENTION  N/A 05/25/2020   Procedure: CORONARY STENT INTERVENTION;  Surgeon: Iran Ouch, MD;  [facility];  Service: Cardiovascular;  Laterality: N/A;   CORONARY STENT INTERVENTION N/A 06/22/2020   Procedure: CORONARY STENT INTERVENTION;  Surgeon: Marykay Lex, MD;  Location: [facility] INVASIVE CV LAB;  Service: Cardiovascular;  Laterality: N/A;   CORONARY ULTRASOUND/IVUS N/A 06/22/2020   Procedure: Intravascular Ultrasound/IVUS;  Surgeon: Marykay Lex, MD;  Location: [facility] INVASIVE CV LAB;  Service: Cardiovascular;  Laterality: N/A;   ICD IMPLANT N/A 12/19/2020   Procedure: ICD IMPLANT;  Surgeon: Marinus Maw, MD;  Location: [facility] INVASIVE CV LAB;  Service:  Cardiovascular;  Laterality: N/A;   INCISION AND DRAINAGE Right 02/19/2020   Procedure: INCISION AND DRAINAGE;  Surgeon: Vivi Barrack, DPM;  [facility];  Service: Podiatry;  Laterality: Right;  Block done by surgeon   INCISION AND DRAINAGE OF WOUND Left 10/31/2023   Procedure: IRRIGATION AND DEBRIDEMENT WOUND;  Surgeon: Felecia Shelling, DPM;  Location: [facility] ORS;  Service: Orthopedics/Podiatry;  Laterality: Left;   IRRIGATION AND DEBRIDEMENT FOOT Left 10/10/2021   Procedure: IRRIGATION AND DEBRIDEMENT FOOT;  Surgeon: Asencion Islam, DPM;  [facility];  Service: Podiatry;  Laterality: Left;   IRRIGATION AND DEBRIDEMENT FOOT Left 11/23/2021   Procedure: IRRIGATION AND DEBRIDEMENT FOOT;  Surgeon: Louann Sjogren, MD;  [facility];  Service: Podiatry;  Laterality: Left;  will do local block   LEFT HEART CATH AND CORS/GRAFTS ANGIOGRAPHY N/A 05/23/2020   Procedure: LEFT HEART CATH AND CORS/GRAFTS ANGIOGRAPHY;  Surgeon: Yvonne Kendall, MD;  [facility];  Service: Cardiovascular;  Laterality: N/A;   LEFT HEART CATH AND CORS/GRAFTS ANGIOGRAPHY N/A 12/19/2020   Procedure: LEFT HEART CATH AND CORS/GRAFTS ANGIOGRAPHY;  Surgeon: Kathleene Hazel, MD;  [facility];  Service: Cardiovascular;  Laterality: N/A;   LEFT HEART CATH AND CORS/GRAFTS ANGIOGRAPHY N/A 07/21/2021   Procedure: LEFT HEART CATH AND CORS/GRAFTS ANGIOGRAPHY;  Surgeon: Yvonne Kendall, MD;  [facility];  Service: Cardiovascular;  Laterality: N/A;   METATARSAL HEAD EXCISION Right 05/02/2020   Procedure: FIRST METATARSAL HEAD RESECTION; RIGHT FOOT WOUND CLOSURE;  Surgeon: Asencion Islam, DPM;  Location: [facility];  Service: Podiatry;  Laterality: Right;  MAC W/LOCAL   PERIPHERAL VASCULAR BALLOON ANGIOPLASTY Left 10/11/2021   Procedure: PERIPHERAL VASCULAR BALLOON ANGIOPLASTY;  Surgeon: Cephus Shelling, MD;  [facility];  Service:  Cardiovascular;  Laterality: Left;  Anterior Tibial Artery   TEE WITHOUT CARDIOVERSION N/A 11/27/2021   Procedure: TRANSESOPHAGEAL ECHOCARDIOGRAM (TEE);  Surgeon: Pricilla Riffle, MD;  Location: [facility] ENDOSCOPY;  Service: Cardiovascular;  Laterality: N/A;   TRANSMETATARSAL AMPUTATION Left 11/23/2021   Procedure: TRANSMETATARSAL AMPUTATION;  Surgeon: Louann Sjogren, MD;  Location: [facility] OR;  Service: Podiatry;  Laterality: Left;   TUBAL LIGATION      Current Outpatient Medications  Medication Sig Dispense Refill   acetaminophen (TYLENOL) 325 MG tablet Take 2 tablets (650 mg total) by mouth every 4 (four) hours as needed for headache or mild pain.     amiodarone (PACERONE) 200 MG tablet Take 1 tablet by mouth once daily 90 tablet 2   aspirin 81 MG chewable tablet Chew 81 mg by mouth daily.     clopidogrel (PLAVIX) 75 MG tablet Take 1 tablet (75 mg total) by mouth daily. 90 tablet 2   Evolocumab (REPATHA SURECLICK) 140 MG/ML SOAJ Inject 140 mg into the skin every 14 (fourteen)  days. 2 mL 1   FARXIGA 10 MG TABS tablet Take 1 tablet by mouth once daily 90 tablet 0   furosemide (LASIX) 20 MG tablet Take 1 tablet (20 mg total) by mouth daily. (Patient taking differently: Take 20 mg by mouth every other day.) 90 tablet 3   HUMALOG KWIKPEN 100 UNIT/ML KwikPen Inject 10-50 Units into the skin at bedtime. Sliding scale 15 mL 12   LORazepam (ATIVAN) 1 MG tablet Take 1 tablet (1 mg total) by mouth as needed for anxiety. 30 tablet 0   metFORMIN (GLUCOPHAGE) 500 MG tablet TAKE 1 TABLET BY MOUTH TWICE DAILY WITH A MEAL 180 tablet 0   metoprolol succinate (TOPROL-XL) 25 MG 24 hr tablet Take 1 tablet (25 mg total) by mouth in the morning, at noon, and at bedtime. 270 tablet 2   nitroGLYCERIN (NITROSTAT) 0.4 MG SL tablet Place 1 tablet (0.4 mg total) under the tongue every 5 (five) minutes as needed for chest pain. 25 tablet 6   nystatin (MYCOSTATIN/NYSTOP) powder Apply 1 Application topically daily as needed  (rash/yeast). 15 g 2   Omega-3 Fatty Acids (FISH OIL) 500 MG CAPS Take 500 mg by mouth at bedtime.     omeprazole (PRILOSEC) 20 MG capsule Take 1 capsule (20 mg total) by mouth daily. 90 capsule 1   promethazine (PHENERGAN) 25 MG tablet Take 25 mg by mouth every 6 (six) hours as needed for nausea or vomiting.     rosuvastatin (CRESTOR) 20 MG tablet Take 1 tablet (20 mg total) by mouth daily. 90 tablet 2   senna-docusate (SENOKOT-S) 8.6-50 MG tablet Take 1 tablet by mouth every other day.     TOUJEO MAX SOLOSTAR 300 UNIT/ML Solostar Pen INJECT 50 UNITS SUBCUTANEOUSLY ONCE DAILY (Patient taking differently: Inject 59 Units into the skin in the morning.) 12 mL 0   valsartan (DIOVAN) 80 MG tablet Take 1 tablet (80 mg total) by mouth 2 (two) times daily. 180 tablet 2   Vitamin D, Ergocalciferol, (DRISDOL) 1.25 MG (50000 UNIT) CAPS capsule TAKE 1 CAPSULE BY MOUTH EVERY SATURDAY 12 capsule 0   No current facility-administered medications for this visit.    Allergies  Allergen Reactions   Vancomycin Anaphylaxis   Chlorhexidine Gluconate Itching    Received CHG bath, began itching, required benadryl   Cat Hair Extract Other (See Comments)    Sneezing, watery eyes.   Tramadol Other (See Comments)    Hallucinations   Codeine Rash    Social History   Socioeconomic History   Marital status: Married    Spouse name: Not on file   Number of children: Not on file   Years of education: Not on file   Highest education level: Not on file  Occupational History   Not on file  Tobacco Use   Smoking status: Former    Current packs/day: 0.00    Types: Cigarettes    Quit date: 05/2016    Years since quitting: 7.5   Smokeless tobacco: Never  Vaping Use   Vaping status: Never Used  Substance and Sexual Activity   Alcohol use: Yes    Comment: rare   Drug use: No   Sexual activity: Yes    Birth control/protection: Surgical    Comment: Hysterectomy  Other Topics Concern   Not on file  Social  History Narrative   Not on file   Social Drivers of Health   Financial Resource Strain: Low Risk  (10/16/2023)   Overall Financial Resource Strain (CARDIA)  Difficulty of Paying Living Expenses: Not hard at all  Food Insecurity: No Food Insecurity (10/16/2023)   Hunger Vital Sign    Worried About Running Out of Food in the Last Year: Never true    Ran Out of Food in the Last Year: Never true  Transportation Needs: No Transportation Needs (10/16/2023)   PRAPARE - Administrator, Civil Service (Medical): No    Lack of Transportation (Non-Medical): No  Physical Activity: Inactive (10/16/2023)   Exercise Vital Sign    Days of Exercise per Week: 0 days    Minutes of Exercise per Session: 0 min  Stress: No Stress Concern Present (10/16/2023)   [employer] of Occupational Health - Occupational Stress Questionnaire    Feeling of Stress : Not at all  Social Connections: Socially Integrated (10/16/2023)   Social Connection and Isolation Panel [NHANES]    Frequency of Communication with Friends and Family: More than three times a week    Frequency of Social Gatherings with Friends and Family: More than three times a week    Attends Religious Services: More than 4 times per year    Active Member of Clubs or Organizations: Yes    Attends Banker Meetings: More than 4 times per year    Marital Status: Married  Catering manager Violence: Not At Risk (10/16/2023)   Humiliation, Afraid, Rape, and Kick questionnaire    Fear of Current or Ex-Partner: No    Emotionally Abused: No    Physically Abused: No    Sexually Abused: No    Family History  Problem Relation Age of Onset   Hypertension Mother    Hyperlipidemia Mother    Heart attack Father    Heart disease Father    Hypertension Father    Alzheimer's disease Father    Hypertension Brother    Hyperlipidemia Brother          10/16/2023    2:11 PM 08/09/2023   10:50 AM 03/19/2023    9:53 AM 09/20/2022    11:17 AM 03/15/2022    3:05 PM  Depression screen PHQ 2/9  Decreased Interest 0 0 0 0 0  Down, Depressed, Hopeless 0 0 1 0 0  PHQ - 2 Score 0 0 1 0 0  Altered sleeping 1 0 1 3   Tired, decreased energy 1 0 1 0   Change in appetite 0 0 0 0   Feeling bad or failure about yourself  0 0 0 0   Trouble concentrating 0 0 0 0   Moving slowly or fidgety/restless 0 0 0 0   Suicidal thoughts 0 0 0 0   PHQ-9 Score 2 0 3 3   Difficult doing work/chores Not difficult at all Not difficult at all Not difficult at all Not difficult at all         10/16/2023    2:10 PM  Fall Risk   Falls in the past year? 0  Number falls in past yr: 0  Injury with Fall? 0  Risk for fall due to : No Fall Risks  Follow up Falls evaluation completed    Patient Care Team: Marianne Sofia, PA-C as PCP - General (Physician Assistant) Baldo Daub, MD as PCP - Cardiology (Cardiology)   Review of Systems  Constitutional:  Negative for fatigue.  HENT:  Negative for congestion, ear pain and sore throat.   Respiratory:  Negative for cough and shortness of breath.   Cardiovascular:  Negative for chest  pain.  Gastrointestinal:  Positive for constipation. Negative for abdominal pain, diarrhea, nausea and vomiting.  Genitourinary:  Negative for dysuria, frequency and urgency.  Musculoskeletal:  Negative for arthralgias, back pain and myalgias.       Previous left foot surgery 2 weeks ago, 2/10 pain  Neurological:  Negative for dizziness and headaches.  Psychiatric/Behavioral:  Positive for sleep disturbance. Negative for agitation. The patient is nervous/anxious.     Current Outpatient Medications on File Prior to Visit  Medication Sig Dispense Refill   acetaminophen (TYLENOL) 325 MG tablet Take 2 tablets (650 mg total) by mouth every 4 (four) hours as needed for headache or mild pain.     amiodarone (PACERONE) 200 MG tablet Take 1 tablet by mouth once daily 90 tablet 2   aspirin 81 MG chewable tablet Chew 81 mg by  mouth daily.     clopidogrel (PLAVIX) 75 MG tablet Take 1 tablet (75 mg total) by mouth daily. 90 tablet 2   Evolocumab (REPATHA SURECLICK) 140 MG/ML SOAJ Inject 140 mg into the skin every 14 (fourteen) days. 2 mL 1   FARXIGA 10 MG TABS tablet Take 1 tablet by mouth once daily 90 tablet 0   furosemide (LASIX) 20 MG tablet Take 1 tablet (20 mg total) by mouth daily. (Patient taking differently: Take 20 mg by mouth every other day.) 90 tablet 3   HUMALOG KWIKPEN 100 UNIT/ML KwikPen Inject 10-50 Units into the skin at bedtime. Sliding scale 15 mL 12   LORazepam (ATIVAN) 1 MG tablet Take 1 tablet (1 mg total) by mouth as needed for anxiety. 30 tablet 0   metFORMIN (GLUCOPHAGE) 500 MG tablet TAKE 1 TABLET BY MOUTH TWICE DAILY WITH A MEAL 180 tablet 0   metoprolol succinate (TOPROL-XL) 25 MG 24 hr tablet Take 1 tablet (25 mg total) by mouth in the morning, at noon, and at bedtime. 270 tablet 2   nitroGLYCERIN (NITROSTAT) 0.4 MG SL tablet Place 1 tablet (0.4 mg total) under the tongue every 5 (five) minutes as needed for chest pain. 25 tablet 6   nystatin (MYCOSTATIN/NYSTOP) powder Apply 1 Application topically daily as needed (rash/yeast). 15 g 2   Omega-3 Fatty Acids (FISH OIL) 500 MG CAPS Take 500 mg by mouth at bedtime.     omeprazole (PRILOSEC) 20 MG capsule Take 1 capsule (20 mg total) by mouth daily. 90 capsule 1   promethazine (PHENERGAN) 25 MG tablet Take 25 mg by mouth every 6 (six) hours as needed for nausea or vomiting.     rosuvastatin (CRESTOR) 20 MG tablet Take 1 tablet (20 mg total) by mouth daily. 90 tablet 2   senna-docusate (SENOKOT-S) 8.6-50 MG tablet Take 1 tablet by mouth every other day.     TOUJEO MAX SOLOSTAR 300 UNIT/ML Solostar Pen INJECT 50 UNITS SUBCUTANEOUSLY ONCE DAILY (Patient taking differently: Inject 59 Units into the skin in the morning.) 12 mL 0   valsartan (DIOVAN) 80 MG tablet Take 1 tablet (80 mg total) by mouth 2 (two) times daily. 180 tablet 2   Vitamin D,  Ergocalciferol, (DRISDOL) 1.25 MG (50000 UNIT) CAPS capsule TAKE 1 CAPSULE BY MOUTH EVERY SATURDAY 12 capsule 0   No current facility-administered medications on file prior to visit.   Past Medical History:  Diagnosis Date   Abnormal EKG 10/23/2021   Abnormal mammogram of left breast 09/04/2021   Abnormal stress test 10/23/2021   Acute chest pain 05/08/2016   AICD (automatic cardioverter/defibrillator) present    St Jude/Abbott  device   AKI (acute kidney injury) (HCC) 10/23/2021   Angina pectoris (HCC) 09/11/2017   Anxiety 10/23/2021   Bacteremia 11/22/2021   Breast wound, right, subsequent encounter 09/04/2021   Burping 05/08/2016   Cancer (HCC)    cervical - hysterectomy   Cellulitis of right breast 10/23/2021   Cellulitis, wound, post-operative 10/09/2021   Chronic diastolic heart failure (HCC) 11/06/2016   Coronary artery disease involving native coronary artery of native heart with angina pectoris (HCC) 09/12/2015   Overview:  PCI and stent of RCA 2009, last cath 2012 with medical therapy  CABG May 2017   Demand ischemia [facility])    Diabetic foot infection (HCC) 11/21/2021   Diabetic foot ulcer (HCC) 02/16/2020   Emphysema lung (HCC) 09/11/2017   patient not aware of this dx   Essential hypertension 09/12/2015   Gangrene (HCC) 10/23/2021   GERD (gastroesophageal reflux disease) 05/08/2016   Hematoma 10/23/2021   Hiatal hernia 10/23/2021   Hyperglycemia 10/23/2021   Hyperglycemia due to type 2 diabetes mellitus (HCC) 10/23/2021   Hyperlipidemia 09/12/2015   Hyponatremia 10/23/2021   ICD (implantable cardioverter-defibrillator) in place 05/16/2021   Infection of great toe 10/23/2021   Lactic acidosis 10/23/2021   Leukocytosis 10/23/2021   Mediastinitis 06/28/2016   Morbid obesity (HCC) 05/08/2016   Myocardial infarction [facility])    NSTEMI (non-ST elevated myocardial infarction) (HCC) 05/22/2020   Obesity (BMI 30-39.9) 10/23/2021   Perineal abscess 10/23/2021   Peripheral  vascular disease (HCC)    S/P CABG (coronary artery bypass graft) 06/03/2016   Overview:  The patient underwent sternal reconstruction on 06/21/16 with pec flaps for mediastinitis from a prior CABG in May 2017. On admission, she was critically ill from sepsis and had altered mental status. She was last seen in clinic on 08/09/16 at which time she was doing well.   Severe sepsis (HCC) 06/03/2016   Sinus tachycardia 05/08/2016   Tobacco use disorder 04/20/2016   Overview:  Quit in May 2017.   Type 2 diabetes mellitus with foot ulcer (HCC) 05/08/2016   Unstable angina (HCC) 05/22/2020   Ventricular tachycardia (HCC) 12/17/2020   Wound, surgical, infected 06/06/2016   Overview:  sternal   Past Surgical History:  Procedure Laterality Date   ABDOMINAL AORTOGRAM W/LOWER EXTREMITY N/A 10/11/2021   Procedure: ABDOMINAL AORTOGRAM W/LOWER EXTREMITY;  Surgeon: Cephus Shelling, MD;  [facility] IN[facility];  Service: Cardiovascular;  Laterality: N/A;   AMPUTATION TOE Right 02/20/2020   Procedure: AMPUTATION RIGHT GREAT  TOE;  Surgeon: Candelaria Stagers, DPM;  Location: [facility] OR;  Service: Podiatry;  Laterality: Right;   BLADDER SURGERY     BONE BIOPSY Left 02/01/2022   Procedure: BONE BIOPSY LEFT FOOT, IRRIGATION AND DEBRIDEMENT;  Surgeon: Asencion Islam, DPM;  Location: [facility] OR;  Service: Podiatry;  Laterality: Left;   BONE BIOPSY Left 10/31/2023   Procedure: BONE BIOPSY;  Surgeon: Felecia Shelling, DPM;  Location: [facility] ORS;  Service: Orthopedics/Podiatry;  Laterality: Left;   BUBBLE STUDY  11/27/2021   Procedure: BUBBLE STUDY;  Surgeon: Pricilla Riffle, MD;  Location: [facility] ENDOSCOPY;  Service: Cardiovascular;;   CARDIAC CATHETERIZATION     CORONARY ARTERY BYPASS GRAFT     CORONARY STENT INTERVENTION N/A 05/25/2020   Procedure: CORONARY STENT INTERVENTION;  Surgeon: Iran Ouch, MD;  [facility];  Service: Cardiovascular;  Laterality: N/A;   CORONARY STENT INTERVENTION N/A 06/22/2020    Procedure: CORONARY STENT INTERVENTION;  Surgeon: Marykay Lex, MD;  [facility];  Service: Cardiovascular;  Laterality: N/A;   CORONARY ULTRASOUND/IVUS N/A 06/22/2020   Procedure: Intravascular Ultrasound/IVUS;  Surgeon: Marykay Lex, MD;  Location: [facility] INVASIVE CV LAB;  Service: Cardiovascular;  Laterality: N/A;   ICD IMPLANT N/A 12/19/2020   Procedure: ICD IMPLANT;  Surgeon: Marinus Maw, MD;  Location: [facility] INVASIVE CV LAB;  Service: Cardiovascular;  Laterality: N/A;   INCISION AND DRAINAGE Right 02/19/2020   Procedure: INCISION AND DRAINAGE;  Surgeon: Vivi Barrack, DPM;  Location: [facility] OR;  Service: Podiatry;  Laterality: Right;  Block done by surgeon   INCISION AND DRAINAGE OF WOUND Left 10/31/2023   Procedure: IRRIGATION AND DEBRIDEMENT WOUND;  Surgeon: Felecia Shelling, DPM;  Location: [facility];  Service: Orthopedics/Podiatry;  Laterality: Left;   IRRIGATION AND DEBRIDEMENT FOOT Left 10/10/2021   Procedure: IRRIGATION AND DEBRIDEMENT FOOT;  Surgeon: Asencion Islam, DPM;  Location: [facility];  Service: Podiatry;  Laterality: Left;   IRRIGATION AND DEBRIDEMENT FOOT Left 11/23/2021   Procedure: IRRIGATION AND DEBRIDEMENT FOOT;  Surgeon: Louann Sjogren, MD;  Location: [facility];  Service: Podiatry;  Laterality: Left;  will do local block   LEFT HEART CATH AND CORS/GRAFTS ANGIOGRAPHY N/A 05/23/2020   Procedure: LEFT HEART CATH AND CORS/GRAFTS ANGIOGRAPHY;  Surgeon: Yvonne Kendall, MD;  Location: [facility];  Service: Cardiovascular;  Laterality: N/A;   LEFT HEART CATH AND CORS/GRAFTS ANGIOGRAPHY N/A 12/19/2020   Procedure: LEFT HEART CATH AND CORS/GRAFTS ANGIOGRAPHY;  Surgeon: Kathleene Hazel, MD;  Location: [facility];  Service: Cardiovascular;  Laterality: N/A;   LEFT HEART CATH AND CORS/GRAFTS ANGIOGRAPHY N/A 07/21/2021   Procedure: LEFT HEART CATH AND CORS/GRAFTS ANGIOGRAPHY;  Surgeon: Yvonne Kendall, MD;  Location: [facility];  Service:  Cardiovascular;  Laterality: N/A;   METATARSAL HEAD EXCISION Right 05/02/2020   Procedure: FIRST METATARSAL HEAD RESECTION; RIGHT FOOT WOUND CLOSURE;  Surgeon: Asencion Islam, DPM;  Location: [facility];  Service: Podiatry;  Laterality: Right;  MAC W/LOCAL   PERIPHERAL VASCULAR BALLOON ANGIOPLASTY Left 10/11/2021   Procedure: PERIPHERAL VASCULAR BALLOON ANGIOPLASTY;  Surgeon: Cephus Shelling, MD;  Location: [facility];  Service: Cardiovascular;  Laterality: Left;  Anterior Tibial Artery   TEE WITHOUT CARDIOVERSION N/A 11/27/2021   Procedure: TRANSESOPHAGEAL ECHOCARDIOGRAM (TEE);  Surgeon: Pricilla Riffle, MD;  Location[facility];  Service: Cardiovascular;  Laterality: N/A;   TRANSMETATARSAL AMPUTATION Left 11/23/2021   Procedure: TRANSMETATARSAL AMPUTATION;  Surgeon: Louann Sjogren, MD;  Location: [facility];  Service: Podiatry;  Laterality: Left;   TUBAL LIGATION      Family History  Problem Relation Age of Onset   Hypertension Mother    Hyperlipidemia Mother    Heart attack Father    Heart disease Father    Hypertension Father    Alzheimer's disease Father    Hypertension Brother    Hyperlipidemia Brother    Social History   Socioeconomic History   Marital status: Married    Spouse name: Not on file   Number of children: Not on file   Years of education: Not on file   Highest education level: Not on file  Occupational History   Not on file  Tobacco Use   Smoking status: Former    Current packs/day: 0.00    Types: Cigarettes    Quit date: 05/2016    Years since quitting: 7.5   Smokeless tobacco: Never  Vaping Use   Vaping status: Never Used  Substance  and Sexual Activity   Alcohol use: Yes    Comment: rare   Drug use: No   Sexual activity: Yes    Birth control/protection: Surgical    Comment: Hysterectomy  Other Topics Concern   Not on file  Social History Narrative   Not on file   Social Drivers of Health   Financial Resource Strain:  Low Risk  (10/16/2023)   Overall Financial Resource Strain (CARDIA)    Difficulty of Paying Living Expenses: Not hard at all  Food Insecurity: No Food Insecurity (10/16/2023)   Hunger Vital Sign    Worried About Running Out of Food in the Last Year: Never true    Ran Out of Food in the Last Year: Never true  Transportation Needs: No Transportation Needs (10/16/2023)   PRAPARE - Administrator, Civil Service (Medical): No    Lack of Transportation (Non-Medical): No  Physical Activity: Inactive (10/16/2023)   Exercise Vital Sign    Days of Exercise per Week: 0 days    Minutes of Exercise per Session: 0 min  Stress: No Stress Concern Present (10/16/2023)   [employer] of Occupational Health - Occupational Stress Questionnaire    Feeling of Stress : Not at all  Social Connections: Socially Integrated (10/16/2023)   Social Connection and Isolation Panel [NHANES]    Frequency of Communication with Friends and Family: More than three times a week    Frequency of Social Gatherings with Friends and Family: More than three times a week    Attends Religious Services: More than 4 times per year    Active Member of [employer] or Organizations: Yes    Attends Engineer, structural: More than 4 times per year    Marital Status: Married    Objective:  BP 120/74 (BP Location: Left Arm, Patient Position: Sitting, Cuff Size: Large)   Pulse 92   Temp (!) 97.2 F (36.2 C) (Temporal)   Resp 20   Ht 5\' 7"  (1.702 m)   Wt 220 lb 9.6 oz (100.1 kg)   LMP  (LMP Unknown)   BMI 34.55 kg/m      12/05/2023    7:33 AM 10/31/2023    2:45 PM 10/31/2023    2:30 PM  BP/Weight  Systolic BP 120 156 129  Diastolic BP 74 83 78  Wt. (Lbs) 220.6    BMI 34.55 kg/m2      Physical Exam Vitals reviewed.  Constitutional:      General: She is not in acute distress.    Appearance: Normal appearance.  HENT:     Head: Normocephalic.     Right Ear: Tympanic membrane normal.     Left Ear:  Tympanic membrane normal.     Nose: Nose normal. No congestion.     Mouth/Throat:     Pharynx: No posterior oropharyngeal erythema.  Eyes:     Conjunctiva/sclera: Conjunctivae normal.  Neck:     Vascular: No carotid bruit.  Cardiovascular:     Rate and Rhythm: Normal rate and regular rhythm.     Heart sounds: Normal heart sounds.  Pulmonary:     Effort: Pulmonary effort is normal.     Breath sounds: Normal breath sounds. No wheezing.  Abdominal:     General: Bowel sounds are normal.     Palpations: Abdomen is soft.     Tenderness: There is no abdominal tenderness.  Musculoskeletal:        General: Normal range of motion.  Cervical back: Normal range of motion.     Right Lower Extremity: (right  great toe amputated)    Left Lower Extremity: Left leg is amputated below ankle. (toes removed) Feet:     Right foot:     Skin integrity: Dry skin present.     Left foot:     Skin integrity: Dry skin present.  Skin:    General: Skin is warm and dry.  Neurological:     Mental Status: She is alert. Mental status is at baseline.     Motor: No weakness.  Psychiatric:        Mood and Affect: Mood normal.        Behavior: Behavior normal.        Diabetic Foot Exam - Simple   Simple Foot Form Visual Inspection See comments: Yes Sensation Testing Intact to touch and monofilament testing bilaterally: Yes Pulse Check Posterior Tibialis and Dorsalis pulse intact bilaterally: Yes Comments Left foot toes removed, surgery tomorrow to repair tendon. Right foot big toe removed.      Lab Results  Component Value Date   WBC 8.7 10/09/2023   HGB 12.9 10/09/2023   HCT 39.6 10/09/2023   PLT 256 10/09/2023   GLUCOSE 276 (H) 10/09/2023   CHOL 216 (H) 08/09/2023   TRIG 248 (H) 08/09/2023   HDL 66 08/09/2023   LDLDIRECT 91.4 05/22/2020   LDLCALC 108 (H) 08/09/2023   ALT 14 08/21/2023   AST 14 08/21/2023   NA 140 10/09/2023   K 4.6 10/09/2023   CL 108 10/09/2023   CREATININE  0.87 10/09/2023   BUN 23 10/09/2023   CO2 24 10/09/2023   TSH 1.850 08/09/2023   INR 1.1 07/20/2021   HGBA1C 8.5 (H) 10/09/2023   MICROALBUR 293.5 (H) 07/21/2021      Assessment & Plan:    Encounter for preoperative assessment Assessment & Plan: Based on the following information, patient's age and chronic problems patient is a moderate to high risk for this low/less invasive surgery.  1 MET = Can take care of self, such as eat, dress, or use the toilet.  (1) 4 METs = Can walk up a flight of steps or a hill or walk on level ground at 3 to 4 mph. (01) 4 and 10 METs = Can do heavy work around the house, such as scrubbing floors or lifting or moving heavy furniture, or climbing two flights of stairs (between) (3) >10 METs = Can participate in strenuous sports such as swimming, singles tennis, football, basketball, and skiing. (0) Total METS = 4  Revised Cardiac Risk Index for Pre-Operative Risk from [email or web address]  on 12/05/2023 ** All calculations should be rechecked by clinician prior to use **  RESULT SUMMARY: 5 points RCRI Score  15.0 % Risk of major cardiac event    High-risk surgery --> 1 = Yes History of ischemic heart disease --> 1 = Yes History of congestive heart failure --> 1 = Yes History of cerebrovascular disease --> 1 = Yes Pre-operative treatment with insulin --> 1 = Yes Pre-operative creatinine >2 mg/dL / 811.9 mol/L --> 0 = No  Gupta Perioperative Risk for Myocardial Infarction or Cardiac Arrest (MICA) from [email or web address]  on 12/05/2023 ** All calculations should be rechecked by clinician prior to use **  RESULT SUMMARY: 0.6 % Risk of myocardial infarction or cardiac arrest, intraoperatively or up to 30 days post-op   INPUTS: Age --> 62 years Functional status --> 0 = Independent ASA class --> -1.92 =  3: severe systemic disease Creatinine --> 0 = Normal (<=1.5 mg/dL, 086 mol/L)      Diabetic ulcer of left heel associated with type 2 diabetes  mellitus, with fat layer exposed (HCC) Assessment & Plan: Labs drawn today Lab Results  Component Value Date   HGBA1C 8.5 (H) 10/09/2023     Orders: -     Hemoglobin A1c  Mixed hyperlipidemia Assessment & Plan: Stable Lab Results  Component Value Date   LDLCALC 108 (H) 08/09/2023  On Crestor 20 mg and omega fish oil 500 mg @ bedtime     Essential hypertension Assessment & Plan: Well controlled BP Readings from Last 3 Encounters:  12/05/23 120/74  10/31/23 (!) 156/83  10/16/23 120/70  Continue all medications as directed.Metoprolol 25 mg by mouth THREE TIMES A DAY  Remain hydrated and follow mediterranean diet. Resume physical activity to continue decreasing BMI Continue to abstain from tobacco/vape/excessive ETOH use Labs drawn today   Orders: -     CBC with Differential/Platelet -     Comprehensive metabolic panel  Coronary artery disease involving native coronary artery of native heart with angina pectoris (HCC) Assessment & Plan: Stable  On Aspirin 81 mg daily, Plavix 75 mg daily, lasix 20 mg daily, nitroglycerin SL tab 0.4 mg PRN, and valsartan 80 mg by mouth TWICE A DAY  Labs drawn today  Per Dr. Dulce Sellar regarding her last surgery advised that she is at acceptable risk and no changes are symptoms since last visit present.   "Chart reviewed as part of pre-operative protocol coverage. Given past medical history and time since last visit, based on ACC/AHA guidelines, Ziyan Borga is at acceptable risk for the planned procedure without further cardiovascular testing.    The patient was advised that if she develops new symptoms prior to surgery to contact our office to arrange for a follow-up visit, and she verbalized understanding.   Per protocol patient can hold ASA and Plavix 5 to 7 days prior to procedure and should restart postprocedure when surgically safe and hemostasis is achieved."        No orders of the defined types were placed in this  encounter.   Orders Placed This Encounter  Procedures   CBC with Differential   Comprehensive metabolic panel   Hemoglobin A1c     Follow-up: Return in about 3 months (around 03/04/2024) for chronic.   I,Jacqua L Marsh,acting as a scribe for Renne Crigler, FNP.,have documented all relevant documentation on the behalf of Renne Crigler, FNP,as directed by  Renne Crigler, FNP while in the presence of Renne Crigler, FNP.   An After Visit Summary was printed and given to the patient.  Total time spent on today's visit was 48 minutes, including both face-to-face time and nonface-to-face time personally spent on review of chart (labs and imaging), discussing labs and goals, discussing further work-up, treatment options, referrals to specialist if needed, reviewing outside records if pertinent, answering patient's questions, and coordinating care.   Lajuana Matte, FNP [facility] [phone]

## 2023-12-05 NOTE — Patient Instructions (Addendum)
Your procedure is scheduled on:12-06-23 Friday Report to the Registration Desk on the 1st floor of the [facility]. To find out your arrival time, please call [phone] between 1PM - 3PM on:12-05-23 Thursday If your arrival time is 6:00 am, do not arrive before that time as the [facility] entrance doors do not open until 6:00 am.  REMEMBER: Instructions that are not followed completely may result in serious medical risk, up to and including death; or upon the discretion of your surgeon and anesthesiologist your surgery may need to be rescheduled.  Do not eat food OR drink any liquids after midnight the night before surgery.  No gum chewing or hard candies  One week prior to surgery:Stop NOW (12-05-23) Stop Anti-inflammatories (NSAIDS) such as Advil, Aleve, Ibuprofen, Motrin, Naproxen, Naprosyn and Aspirin based products such as Excedrin, Goody's Powder, BC Powder. Stop ANY OVER THE COUNTER supplements until after surgery (Fish Oil, Vitamin D)  You may however, continue to take Tylenol if needed for pain up until the day of surgery.  Last dose of clopidogrel (PLAVIX) was on 12-01-23  Last dose of Aspirin was on 12-02-23   Last dose of Marcelline Deist was on 12-02-23   Last dose of metFORMIN (GLUCOPHAGE) was on 12-02-23  Continue taking all of your other prescription medications up until the day of surgery.  ON THE DAY OF SURGERY ONLY TAKE THESE MEDICATIONS WITH SIPS OF WATER: -amiodarone (PACERONE)  -metoprolol succinate (TOPROL-XL)  -omeprazole (PRILOSEC)   Do NOT take any Insulin the morning of surgery  No Alcohol for 24 hours before or after surgery.  No Smoking including e-cigarettes for 24 hours before surgery.  No chewable tobacco products for at least 6 hours before surgery.  No nicotine patches on the day of surgery.  Do not use any "recreational" drugs for at least a week (preferably 2 weeks) before your surgery.  Please be advised that the combination of cocaine and  anesthesia may have negative outcomes, up to and including death. If you test positive for cocaine, your surgery will be cancelled.  On the morning of surgery brush your teeth with toothpaste and water, you may rinse your mouth with mouthwash if you wish. Do not swallow any toothpaste or mouthwash.  Do not wear jewelry, make-up, hairpins, clips or nail polish.  For welded (permanent) jewelry: bracelets, anklets, waist bands, etc.  Please have this removed prior to surgery.  If it is not removed, there is a chance that hospital personnel will need to cut it off on the day of surgery.  Do not wear lotions, powders, or perfumes.   Do not shave body hair from the neck down 48 hours before surgery.  Contact lenses, hearing aids and dentures may not be worn into surgery.  Do not bring valuables to the hospital. [facility] is not responsible for any missing/lost belongings or valuables.   Notify your doctor if there is any change in your medical condition (cold, fever, infection).  Wear comfortable clothing (specific to your surgery type) to the hospital.  After surgery, you can help prevent lung complications by doing breathing exercises.  Take deep breaths and cough every 1-2 hours. Your doctor may order a device called an Incentive Spirometer to help you take deep breaths. When coughing or sneezing, hold a pillow firmly against your incision with both hands. This is called "splinting." Doing this helps protect your incision. It also decreases belly discomfort.  If you are being admitted to the hospital overnight, leave your  suitcase in the car. After surgery it may be brought to your room.  In case of increased patient census, it may be necessary for you, the patient, to continue your postoperative care in the Same Day Surgery department.  If you are being discharged the day of surgery, you will not be allowed to drive home. You will need a responsible individual to drive you home and  stay with you for 24 hours after surgery.   If you are taking public transportation, you will need to have a responsible individual with you.  Please call the Pre-admissions Testing Dept. at [phone] if you have any questions about these instructions.  Surgery Visitation Policy:  Patients having surgery or a procedure may have two visitors.  Children under the age of 12 must have an adult with them who is not the patient.

## 2023-12-05 NOTE — Assessment & Plan Note (Signed)
Stable Lab Results  Component Value Date   LDLCALC 108 (H) 08/09/2023  On Crestor 20 mg and omega fish oil 500 mg @ bedtime

## 2023-12-05 NOTE — Assessment & Plan Note (Signed)
Stable  On Aspirin 81 mg daily, Plavix 75 mg daily, lasix 20 mg daily, nitroglycerin SL tab 0.4 mg PRN, and valsartan 80 mg by mouth TWICE A DAY  Labs drawn today  Per Dr. Dulce Sellar regarding her last surgery advised that she is at acceptable risk and no changes are symptoms since last visit present.   "Chart reviewed as part of pre-operative protocol coverage. Given past medical history and time since last visit, based on ACC/AHA guidelines, Jasmine Richardson is at acceptable risk for the planned procedure without further cardiovascular testing.    The patient was advised that if she develops new symptoms prior to surgery to contact our office to arrange for a follow-up visit, and she verbalized understanding.   Per protocol patient can hold ASA and Plavix 5 to 7 days prior to procedure and should restart postprocedure when surgically safe and hemostasis is achieved."

## 2023-12-06 ENCOUNTER — Other Ambulatory Visit: Payer: Self-pay

## 2023-12-06 ENCOUNTER — Other Ambulatory Visit: Payer: Self-pay | Admitting: Podiatry

## 2023-12-06 ENCOUNTER — Encounter
Admission: RE | Disposition: A | Discharge status: Home / Self Care | Payer: Self-pay | Source: Home / Self Care | Attending: Podiatry

## 2023-12-06 ENCOUNTER — Ambulatory Visit
Admission: RE | Admit: 2023-12-06 | Discharge: 2023-12-06 | Disposition: A | Discharge status: Home / Self Care | Payer: BC Managed Care – PPO | Attending: Podiatry | Admitting: Podiatry

## 2023-12-06 ENCOUNTER — Ambulatory Visit: Payer: BC Managed Care – PPO

## 2023-12-06 ENCOUNTER — Ambulatory Visit: Payer: BC Managed Care – PPO | Admitting: Anesthesiology

## 2023-12-06 ENCOUNTER — Encounter: Payer: Self-pay | Admitting: Podiatry

## 2023-12-06 DIAGNOSIS — E11621 Type 2 diabetes mellitus with foot ulcer: Secondary | ICD-10-CM | POA: Insufficient documentation

## 2023-12-06 DIAGNOSIS — I2510 Atherosclerotic heart disease of native coronary artery without angina pectoris: Secondary | ICD-10-CM | POA: Insufficient documentation

## 2023-12-06 DIAGNOSIS — I5032 Chronic diastolic (congestive) heart failure: Secondary | ICD-10-CM | POA: Diagnosis not present

## 2023-12-06 DIAGNOSIS — L97422 Non-pressure chronic ulcer of left heel and midfoot with fat layer exposed: Secondary | ICD-10-CM | POA: Diagnosis not present

## 2023-12-06 DIAGNOSIS — Z7982 Long term (current) use of aspirin: Secondary | ICD-10-CM | POA: Diagnosis not present

## 2023-12-06 DIAGNOSIS — Z794 Long term (current) use of insulin: Secondary | ICD-10-CM | POA: Diagnosis not present

## 2023-12-06 DIAGNOSIS — Z951 Presence of aortocoronary bypass graft: Secondary | ICD-10-CM | POA: Insufficient documentation

## 2023-12-06 DIAGNOSIS — M21172 Varus deformity, not elsewhere classified, left ankle: Secondary | ICD-10-CM | POA: Diagnosis not present

## 2023-12-06 DIAGNOSIS — Z955 Presence of coronary angioplasty implant and graft: Secondary | ICD-10-CM | POA: Diagnosis not present

## 2023-12-06 DIAGNOSIS — I252 Old myocardial infarction: Secondary | ICD-10-CM | POA: Insufficient documentation

## 2023-12-06 DIAGNOSIS — I110 Hypertensive heart disease with heart failure: Secondary | ICD-10-CM | POA: Insufficient documentation

## 2023-12-06 DIAGNOSIS — Z7902 Long term (current) use of antithrombotics/antiplatelets: Secondary | ICD-10-CM | POA: Insufficient documentation

## 2023-12-06 DIAGNOSIS — Z79899 Other long term (current) drug therapy: Secondary | ICD-10-CM | POA: Insufficient documentation

## 2023-12-06 DIAGNOSIS — Z7984 Long term (current) use of oral hypoglycemic drugs: Secondary | ICD-10-CM | POA: Insufficient documentation

## 2023-12-06 DIAGNOSIS — Z87891 Personal history of nicotine dependence: Secondary | ICD-10-CM | POA: Diagnosis not present

## 2023-12-06 DIAGNOSIS — Z01818 Encounter for other preprocedural examination: Secondary | ICD-10-CM

## 2023-12-06 DIAGNOSIS — L97529 Non-pressure chronic ulcer of other part of left foot with unspecified severity: Secondary | ICD-10-CM | POA: Insufficient documentation

## 2023-12-06 DIAGNOSIS — F419 Anxiety disorder, unspecified: Secondary | ICD-10-CM | POA: Diagnosis not present

## 2023-12-06 DIAGNOSIS — K449 Diaphragmatic hernia without obstruction or gangrene: Secondary | ICD-10-CM | POA: Insufficient documentation

## 2023-12-06 DIAGNOSIS — E1151 Type 2 diabetes mellitus with diabetic peripheral angiopathy without gangrene: Secondary | ICD-10-CM | POA: Insufficient documentation

## 2023-12-06 DIAGNOSIS — Z6834 Body mass index (BMI) 34.0-34.9, adult: Secondary | ICD-10-CM | POA: Insufficient documentation

## 2023-12-06 DIAGNOSIS — Z95810 Presence of automatic (implantable) cardiac defibrillator: Secondary | ICD-10-CM | POA: Diagnosis not present

## 2023-12-06 DIAGNOSIS — I472 Ventricular tachycardia, unspecified: Secondary | ICD-10-CM | POA: Diagnosis not present

## 2023-12-06 DIAGNOSIS — K219 Gastro-esophageal reflux disease without esophagitis: Secondary | ICD-10-CM | POA: Insufficient documentation

## 2023-12-06 HISTORY — PX: TIBIALIS TENDON TRANSFER / REPAIR: SHX6630

## 2023-12-06 LAB — GLUCOSE, CAPILLARY
Glucose-Capillary: 168 mg/dL — ABNORMAL HIGH (ref 70–99)
Glucose-Capillary: 193 mg/dL — ABNORMAL HIGH (ref 70–99)

## 2023-12-06 SURGERY — TRANSFER, TENDON, TIBIA
Anesthesia: General | Site: Foot | Laterality: Left

## 2023-12-06 MED ORDER — ORAL CARE MOUTH RINSE: 15.0000 mL | Freq: Once | OROMUCOSAL | Status: DC

## 2023-12-06 MED ORDER — DROPERIDOL 2.5 MG/ML IJ SOLN: 0.6250 mg | Freq: Once | INTRAMUSCULAR | Status: DC | PRN

## 2023-12-06 MED ORDER — MIDAZOLAM HCL 2 MG/2ML IJ SOLN
INTRAMUSCULAR | Status: DC | PRN
  Administered 2023-12-06: 2 mg via INTRAVENOUS

## 2023-12-06 MED ORDER — EPHEDRINE SULFATE-NACL 50-0.9 MG/10ML-% IV SOSY
PREFILLED_SYRINGE | INTRAVENOUS | Status: DC | PRN
  Administered 2023-12-06: 5 mg via INTRAVENOUS
  Administered 2023-12-06: 10 mg via INTRAVENOUS
  Administered 2023-12-06 (×2): 5 mg via INTRAVENOUS

## 2023-12-06 MED ORDER — DEXAMETHASONE SODIUM PHOSPHATE 10 MG/ML IJ SOLN
INTRAMUSCULAR | Status: DC | PRN
  Administered 2023-12-06: 10 mg via INTRAVENOUS

## 2023-12-06 MED ORDER — ACETAMINOPHEN 10 MG/ML IV SOLN
INTRAVENOUS | Status: AC
  Filled 2023-12-06: qty 100

## 2023-12-06 MED ORDER — FENTANYL CITRATE (PF) 100 MCG/2ML IJ SOLN
INTRAMUSCULAR | Status: DC | PRN
  Administered 2023-12-06: 50 ug via INTRAVENOUS
  Administered 2023-12-06 (×2): 25 ug via INTRAVENOUS

## 2023-12-06 MED ORDER — PHENYLEPHRINE 80 MCG/ML (10ML) SYRINGE FOR IV PUSH (FOR BLOOD PRESSURE SUPPORT)
PREFILLED_SYRINGE | INTRAVENOUS | Status: AC
  Filled 2023-12-06: qty 10

## 2023-12-06 MED ORDER — ACETAMINOPHEN 10 MG/ML IV SOLN
INTRAVENOUS | Status: DC | PRN
  Administered 2023-12-06: 1000 mg via INTRAVENOUS

## 2023-12-06 MED ORDER — BUPIVACAINE LIPOSOME 1.3 % IJ SUSP
INTRAMUSCULAR | Status: AC
  Filled 2023-12-06: qty 20

## 2023-12-06 MED ORDER — ONDANSETRON HCL 4 MG/2ML IJ SOLN
INTRAMUSCULAR | Status: DC | PRN
  Administered 2023-12-06: 4 mg via INTRAVENOUS

## 2023-12-06 MED ORDER — BUPIVACAINE HCL (PF) 0.5 % IJ SOLN
INTRAMUSCULAR | Status: AC
  Filled 2023-12-06: qty 30

## 2023-12-06 MED ORDER — OXYCODONE-ACETAMINOPHEN 5-325 MG PO TABS: 1.0000 | ORAL_TABLET | ORAL | 0 refills | Status: DC | PRN

## 2023-12-06 MED ORDER — BUPIVACAINE HCL (PF) 0.25 % IJ SOLN
INTRAMUSCULAR | Status: AC
  Filled 2023-12-06: qty 30

## 2023-12-06 MED ORDER — 0.9 % SODIUM CHLORIDE (POUR BTL) OPTIME
TOPICAL | Status: DC | PRN
  Administered 2023-12-06: 500 mL

## 2023-12-06 MED ORDER — PHENYLEPHRINE HCL-NACL 20-0.9 MG/250ML-% IV SOLN
INTRAVENOUS | Status: DC | PRN
  Administered 2023-12-06: 20 ug/min via INTRAVENOUS

## 2023-12-06 MED ORDER — AMOXICILLIN-POT CLAVULANATE 875-125 MG PO TABS: 1.0000 | ORAL_TABLET | Freq: Two times a day (BID) | ORAL | 0 refills | Status: DC

## 2023-12-06 MED ORDER — LIDOCAINE HCL (PF) 1 % IJ SOLN
INTRAMUSCULAR | Status: AC
  Filled 2023-12-06: qty 30

## 2023-12-06 MED ORDER — PHENYLEPHRINE 80 MCG/ML (10ML) SYRINGE FOR IV PUSH (FOR BLOOD PRESSURE SUPPORT)
PREFILLED_SYRINGE | INTRAVENOUS | Status: DC | PRN
  Administered 2023-12-06 (×3): 80 ug via INTRAVENOUS
  Administered 2023-12-06 (×2): 160 ug via INTRAVENOUS
  Administered 2023-12-06 (×4): 80 ug via INTRAVENOUS

## 2023-12-06 MED ORDER — FENTANYL CITRATE (PF) 100 MCG/2ML IJ SOLN
INTRAMUSCULAR | Status: AC
  Filled 2023-12-06: qty 2

## 2023-12-06 MED ORDER — DEXMEDETOMIDINE HCL IN NACL 80 MCG/20ML IV SOLN
INTRAVENOUS | Status: DC | PRN
  Administered 2023-12-06: 12 ug via INTRAVENOUS

## 2023-12-06 MED ORDER — OXYCODONE HCL 5 MG/5ML PO SOLN: 5.0000 mg | Freq: Once | ORAL | Status: DC | PRN

## 2023-12-06 MED ORDER — BUPIVACAINE HCL (PF) 0.5 % IJ SOLN
INTRAMUSCULAR | Status: DC | PRN
  Administered 2023-12-06: 20 mL

## 2023-12-06 MED ORDER — OXYCODONE HCL 5 MG PO TABS: 5.0000 mg | ORAL_TABLET | Freq: Once | ORAL | Status: DC | PRN

## 2023-12-06 MED ORDER — LIDOCAINE HCL (CARDIAC) PF 100 MG/5ML IV SOSY
PREFILLED_SYRINGE | INTRAVENOUS | Status: DC | PRN
  Administered 2023-12-06: 50 mg via INTRAVENOUS

## 2023-12-06 MED ORDER — MIDAZOLAM HCL 2 MG/2ML IJ SOLN
INTRAMUSCULAR | Status: AC
  Filled 2023-12-06: qty 2

## 2023-12-06 MED ORDER — FENTANYL CITRATE (PF) 100 MCG/2ML IJ SOLN: 25.0000 ug | INTRAMUSCULAR | Status: DC | PRN

## 2023-12-06 MED ORDER — ACETAMINOPHEN 10 MG/ML IV SOLN: 1000.0000 mg | Freq: Once | INTRAVENOUS | Status: DC | PRN

## 2023-12-06 MED ORDER — PROPOFOL 10 MG/ML IV BOLUS
INTRAVENOUS | Status: DC | PRN
  Administered 2023-12-06: 30 mg via INTRAVENOUS
  Administered 2023-12-06: 100 mg via INTRAVENOUS

## 2023-12-06 MED ORDER — CEFAZOLIN SODIUM-DEXTROSE 2-4 GM/100ML-% IV SOLN
INTRAVENOUS | Status: AC
  Filled 2023-12-06: qty 100

## 2023-12-06 SURGICAL SUPPLY — 66 items
ANCHOR BUTTON TIGHTROPE 14 (Anchor) IMPLANT
BLADE SURG 15 STRL LF DISP TIS (BLADE) ×2 IMPLANT
BLADE SURG MINI STRL (BLADE) ×1 IMPLANT
BNDG ELASTIC 4X5.8 VLCR NS LF (GAUZE/BANDAGES/DRESSINGS) ×2 IMPLANT
BNDG ESMARCH 4X12 STRL LF (GAUZE/BANDAGES/DRESSINGS) ×1 IMPLANT
BNDG STRETCH GAUZE 3IN X12FT (GAUZE/BANDAGES/DRESSINGS) ×1 IMPLANT
BOOT STEPPER DURA MED (SOFTGOODS) IMPLANT
COVER LIGHT HANDLE STERIS (MISCELLANEOUS) IMPLANT
CUFF TOURN SGL QUICK 18X4 (TOURNIQUET CUFF) IMPLANT
CUFF TRNQT CYL 24X4X16.5-23 (TOURNIQUET CUFF) IMPLANT
DRAPE FLUOR MINI C-ARM 54X84 (DRAPES) ×1 IMPLANT
DRSG EMULSION OIL 3X8 NADH (GAUZE/BANDAGES/DRESSINGS) IMPLANT
DURAPREP 26ML APPLICATOR (WOUND CARE) ×1 IMPLANT
ELECT REM PT RETURN 9FT ADLT (ELECTROSURGICAL) ×1
ELECTRODE REM PT RTRN 9FT ADLT (ELECTROSURGICAL) ×1 IMPLANT
FHL IMPLANT SYSTEM 6.25 (Anchor) IMPLANT
FHL IMPLANT SYSTEM 7 (Anchor) ×1 IMPLANT
GAUZE SPONGE 4X4 12PLY STRL (GAUZE/BANDAGES/DRESSINGS) ×1 IMPLANT
GAUZE STRETCH 2X75IN STRL (MISCELLANEOUS) ×1 IMPLANT
GAUZE XEROFORM 1X8 LF (GAUZE/BANDAGES/DRESSINGS) ×1 IMPLANT
GLOVE BIO SURGEON STRL SZ7.5 (GLOVE) ×1 IMPLANT
GLOVE INDICATOR 8.0 STRL GRN (GLOVE) ×1 IMPLANT
GOWN STRL REUS W/ TWL XL LVL3 (GOWN DISPOSABLE) ×2 IMPLANT
HANDLE YANKAUER SUCT BULB TIP (MISCELLANEOUS) ×1 IMPLANT
IMPL FDL SYSTEM 5.5 (Shoulder) IMPLANT
IMPLANT FDL SYSTEM 5.5 (Shoulder) IMPLANT
KIT SIZER GRAFT TENODESIS SUTR (KITS) IMPLANT
KIT SUTURE 1.8 Q-FIX DISP (KITS) ×1 IMPLANT
KIT TURNOVER KIT A (KITS) ×1 IMPLANT
LABEL OR SOLS (LABEL) ×1 IMPLANT
MANIFOLD NEPTUNE II (INSTRUMENTS) ×1 IMPLANT
NDL FILTER BLUNT 18X1 1/2 (NEEDLE) ×1 IMPLANT
NDL HYPO 25X1 1.5 SAFETY (NEEDLE) ×1 IMPLANT
NDL SUT 5 .5 CRC TPR PNT MAYO (NEEDLE) IMPLANT
NEEDLE FILTER BLUNT 18X1 1/2 (NEEDLE) ×1
NEEDLE HYPO 25X1 1.5 SAFETY (NEEDLE) ×1
NS IRRIG 500ML POUR BTL (IV SOLUTION) ×1 IMPLANT
PACK EXTREMITY ARMC (MISCELLANEOUS) ×1 IMPLANT
PAD ABD DERMACEA PRESS 5X9 (GAUZE/BANDAGES/DRESSINGS) IMPLANT
PAD CAST 4YDX4 CTTN HI CHSV (CAST SUPPLIES) ×2 IMPLANT
RASP SM TEAR CROSS CUT (RASP) ×1 IMPLANT
RETRIEVER SUT HEWSON (MISCELLANEOUS) IMPLANT
SOL PREP PVP 2OZ (MISCELLANEOUS) ×1
SOLUTION PREP PVP 2OZ (MISCELLANEOUS) ×1 IMPLANT
SPLINT CAST 1 STEP 5X30 WHT (MISCELLANEOUS) ×1 IMPLANT
SPLINT PLASTER CAST FAST 5X30 (CAST SUPPLIES) ×1 IMPLANT
SPONGE T-LAP 18X18 ~~LOC~~+RFID (SPONGE) ×1 IMPLANT
STAPLER SKIN PROX 35W (STAPLE) IMPLANT
STOCKINETTE M/LG 89821 (MISCELLANEOUS) ×1 IMPLANT
STRIP CLOSURE SKIN 1/2X4 (GAUZE/BANDAGES/DRESSINGS) ×1 IMPLANT
SUT 2 FIBERLOOP 20 STRT BLUE (SUTURE) ×2
SUT MNCRL+ 5-0 VIOLET P-3 (SUTURE) ×1 IMPLANT
SUT PROLENE 3 0 PS 2 (SUTURE) IMPLANT
SUT VIC AB 0 SH 27 (SUTURE) ×1 IMPLANT
SUT VIC AB 2-0 SH 27XBRD (SUTURE) ×2 IMPLANT
SUT VIC AB 3-0 SH 27X BRD (SUTURE) ×1 IMPLANT
SUT VIC AB 4-0 FS2 27 (SUTURE) ×1 IMPLANT
SUT VICRYL AB 3-0 FS1 BRD 27IN (SUTURE) ×1 IMPLANT
SUTURE 2 FIBERLOOP 20 STRT BLU (SUTURE) IMPLANT
SWABSTK COMLB BENZOIN TINCTURE (MISCELLANEOUS) ×1 IMPLANT
SYR 10ML LL (SYRINGE) ×2 IMPLANT
SYR 3ML LL SCALE MARK (SYRINGE) ×1 IMPLANT
SYSTEM IMPLANT FHL 6.25 (Anchor) IMPLANT
SYSTEM IMPLANT FHL 7 (Anchor) IMPLANT
TRAP FLUID SMOKE EVACUATOR (MISCELLANEOUS) ×1 IMPLANT
WATER STERILE IRR 500ML POUR (IV SOLUTION) ×1 IMPLANT

## 2023-12-06 NOTE — Anesthesia Postprocedure Evaluation (Signed)
Anesthesia Post Note  Patient: Jasmine Richardson  Procedure(s) Performed: TIBIALIS TENDON TRANSFER (Left: Foot)  Patient location during evaluation: PACU Anesthesia Type: General Level of consciousness: awake and alert Pain management: pain level controlled Vital Signs Assessment: post-procedure vital signs reviewed and stable Respiratory status: spontaneous breathing, nonlabored ventilation, respiratory function stable and patient connected to nasal cannula oxygen Cardiovascular status: blood pressure returned to baseline and stable Postop Assessment: no apparent nausea or vomiting Anesthetic complications: no   No notable events documented.   Last Vitals:  Vitals:   12/06/23 1920 12/06/23 1934  BP:  134/71  Pulse: 71 72  Resp: 19 20  Temp:  (!) 36.4 C  SpO2: 94% 97%    Last Pain:  Vitals:   12/06/23 1934  TempSrc: Temporal  PainSc: 0-No pain                 Cleda Mccreedy Keevan Wolz

## 2023-12-06 NOTE — Progress Notes (Signed)
PRN postop 

## 2023-12-06 NOTE — Transfer of Care (Signed)
Immediate Anesthesia Transfer of Care Note  Patient: Shrinika Popick  Procedure(s) Performed: TIBIALIS TENDON TRANSFER (Left: Foot)  Patient Location: PACU  Anesthesia Type:General  Level of Consciousness: drowsy  Airway & Oxygen Therapy: Patient Spontanous Breathing and Patient connected to face mask oxygen  Post-op Assessment: Report given to RN and Post -op Vital signs reviewed and stable  Post vital signs: Reviewed and stable  Last Vitals:  Vitals Value Taken Time  BP 110/56 12/06/23 1849  Temp 36.2 C 12/06/23 1849  Pulse 68 12/06/23 1851  Resp 18 12/06/23 1851  SpO2 97 % 12/06/23 1851  Vitals shown include unfiled device data.  Last Pain:  Vitals:   12/06/23 1201  TempSrc: Temporal  PainSc: 0-No pain         Complications: No notable events documented.

## 2023-12-06 NOTE — Interval H&P Note (Signed)
History and Physical Interval Note:  12/06/2023 2:52 PM  Jasmine Richardson  has presented today for surgery, with the diagnosis of ulcer of left foot.  The various methods of treatment have been discussed with the patient and family. After consideration of risks, benefits and other options for treatment, the patient has consented to  Procedure(s): TIBIALIS TENDON TRANSFER (Left) as a surgical intervention.  The patient's history has been reviewed, patient examined, no change in status, stable for surgery.  I have reviewed the patient's chart and labs.  Questions were answered to the patient's satisfaction.     Felecia Shelling

## 2023-12-06 NOTE — Brief Op Note (Signed)
12/06/2023  6:46 PM  PATIENT:  Jasmine Richardson  62 y.o. female  PRE-OPERATIVE DIAGNOSIS:  ulcer of left foot  POST-OPERATIVE DIAGNOSIS:  ulcer of left foot  PROCEDURE:  Procedure(s): TIBIALIS TENDON TRANSFER (Left)  SURGEON:  Surgeons and Role:    Felecia Shelling, DPM - Primary  PHYSICIAN ASSISTANT: none  ASSISTANTS: none   ANESTHESIA:   general  EBL:  30 mL   BLOOD ADMINISTERED:none  DRAINS: none   LOCAL MEDICATIONS USED:  MARCAINE    and Amount: 20 ml  SPECIMEN:  No Specimen  DISPOSITION OF SPECIMEN:  N/A  COUNTS:  YES  TOURNIQUET:   Total Tourniquet Time Documented: Thigh (Left) - 121 minutes Total: Thigh (Left) - 121 minutes   DICTATION: .Reubin Milan Dictation  PLAN OF CARE: Discharge to home after PACU  PATIENT DISPOSITION:  PACU - hemodynamically stable.   Delay start of Pharmacological VTE agent (>24hrs) Resume plavix 24 hrs postop

## 2023-12-06 NOTE — Anesthesia Preprocedure Evaluation (Addendum)
Anesthesia Evaluation  Patient identified by MRN, date of birth, ID band Patient awake    Reviewed: Allergy & Precautions, H&P , NPO status , Patient's Chart, lab work & pertinent test results, reviewed documented beta blocker date and time   Airway Mallampati: III  TM Distance: >3 FB Neck ROM: full    Dental  (+) Edentulous Upper, Edentulous Lower   Pulmonary former smoker   Pulmonary exam normal        Cardiovascular Exercise Tolerance: Poor hypertension, (-) angina + CAD, + Past MI (2021), + Cardiac Stents (2021), + CABG (2017), + Peripheral Vascular Disease and +CHF (HFpEF)  Normal cardiovascular exam+ dysrhythmias (Ventricular tachycardia 2022) + Cardiac Defibrillator (St Jude/Abbott device)      Neuro/Psych   Anxiety     negative neurological ROS     GI/Hepatic Neg liver ROS, hiatal hernia,GERD  ,,  Endo/Other  diabetes, Poorly Controlled, Type 2    Renal/GU      Musculoskeletal   Abdominal  (+) + obese  Peds  Hematology negative hematology ROS (+)   Anesthesia Other Findings Past Medical History: 10/23/2021: Abnormal EKG 09/04/2021: Abnormal mammogram of left breast 10/23/2021: Abnormal stress test No date: Acquired varus deformity of foot, left 05/08/2016: Acute chest pain No date: AICD (automatic cardioverter/defibrillator) present     Comment:  St Jude/Abbott device 10/23/2021: AKI (acute kidney injury) (HCC) 09/11/2017: Angina pectoris (HCC) 10/23/2021: Anxiety 11/22/2021: Bacteremia 09/04/2021: Breast wound, right, subsequent encounter 05/08/2016: Burping No date: Cancer [facility])     Comment:  cervical - hysterectomy 10/23/2021: Cellulitis of right breast 10/09/2021: Cellulitis, wound, post-operative 11/06/2016: Chronic diastolic heart failure (HCC) 09/12/2015: Coronary artery disease involving native coronary artery  of native heart with angina pectoris (HCC)     Comment:  Overview:  PCI and  stent of RCA 2009, last cath 2012               with medical therapy  CABG May 2017 No date: Demand ischemia (HCC) 11/21/2021: Diabetic foot infection (HCC) 02/16/2020: Diabetic foot ulcer (HCC) 09/11/2017: Emphysema lung (HCC)     Comment:  patient not aware of this dx 09/12/2015: Essential hypertension 10/23/2021: Gangrene (HCC) 05/08/2016: GERD (gastroesophageal reflux disease) 10/23/2021: Hematoma 10/23/2021: Hiatal hernia No date: History of methicillin resistant staphylococcus aureus (MRSA) 10/23/2021: Hyperglycemia 10/23/2021: Hyperglycemia due to type 2 diabetes mellitus (HCC) No date: Hyperkalemia 09/12/2015: Hyperlipidemia 10/23/2021: Hyponatremia 05/16/2021: ICD (implantable cardioverter-defibrillator) in place 10/23/2021: Infection of great toe 10/23/2021: Lactic acidosis 10/23/2021: Leukocytosis 06/28/2016: Mediastinitis 05/08/2016: Morbid obesity (HCC) No date: Myocardial infarction (HCC) 05/22/2020: NSTEMI (non-ST elevated myocardial infarction) (HCC) 10/23/2021: Obesity (BMI 30-39.9) 10/23/2021: Perineal abscess No date: Peripheral vascular disease (HCC) 06/03/2016: S/P CABG (coronary artery bypass graft)     Comment:  Overview:  The patient underwent sternal reconstruction               on 06/21/16 with pec flaps for mediastinitis from a prior               CABG in May 2017. On admission, she was critically ill               from sepsis and had altered mental status. She was last               seen in clinic on 08/09/16 at which time she was doing               well. 06/03/2016: Severe sepsis (HCC) 05/08/2016: Sinus tachycardia 04/20/2016: Tobacco use disorder  Comment:  Overview:  Quit in May 2017. 05/08/2016: Type 2 diabetes mellitus with foot ulcer (HCC) 05/22/2020: Unstable angina (HCC) 12/17/2020: Ventricular tachycardia (HCC) 06/06/2016: Wound, surgical, infected     Comment:  Overview:  sternal  Past Surgical History: 10/11/2021: ABDOMINAL  AORTOGRAM W/LOWER EXTREMITY; N/A     Comment:  Procedure: ABDOMINAL AORTOGRAM W/LOWER EXTREMITY;                Surgeon: Cephus Shelling, MD;  [facility]: [facility];  Service: Cardiovascular;  Laterality: N/A; 02/20/2020: AMPUTATION TOE; Right     Comment:  Procedure: AMPUTATION RIGHT GREAT  TOE;  Surgeon: Candelaria Stagers, DPM;  Location: [facility] OR;  Service: Podiatry;                Laterality: Right; No date: BLADDER SURGERY 02/01/2022: BONE BIOPSY; Left     Comment:  Procedure: BONE BIOPSY LEFT FOOT, IRRIGATION AND               DEBRIDEMENT;  Surgeon: Asencion Islam, DPM;  Location:               [facility] OR;  Service: Podiatry;  Laterality: Left; 10/31/2023: BONE BIOPSY; Left     Comment:  Procedure: BONE BIOPSY;  Surgeon: Felecia Shelling, DPM;                Location: [facility] ORS;  Service: Orthopedics/Podiatry;                Laterality: Left; 11/27/2021: BUBBLE STUDY     Comment:  Procedure: BUBBLE STUDY;  Surgeon: Pricilla Riffle, MD;                Location: [facility] ENDOSCOPY;  Service: Cardiovascular;; No date: CARDIAC CATHETERIZATION No date: CORONARY ARTERY BYPASS GRAFT 05/25/2020: CORONARY STENT INTERVENTION; N/A     Comment:  Procedure: CORONARY STENT INTERVENTION;  Surgeon: Iran Ouch, MD;  [facility];  Service:               Cardiovascular;  Laterality: N/A; 06/22/2020: CORONARY STENT INTERVENTION; N/A     Comment:  Procedure: CORONARY STENT INTERVENTION;  Surgeon:               Marykay Lex, MD;  [facility]: [facility];                Service: Cardiovascular;  Laterality: N/A; 06/22/2020: CORONARY ULTRASOUND/IVUS; N/A     Comment:  Procedure: Intravascular Ultrasound/IVUS;  Surgeon:               Marykay Lex, MD;  [facility];                Service: Cardiovascular;  Laterality: N/A; 12/19/2020: ICD IMPLANT; N/A     Comment:  Procedure: ICD IMPLANT;  Surgeon: Marinus Maw, MD;                 [facility];  Service: Cardiovascular;                Laterality: N/A; 02/19/2020: INCISION AND DRAINAGE; Right     Comment:  Procedure: INCISION AND DRAINAGE;  Surgeon: Ardelle Anton,  Lesia Sago, DPM;  Location: [facility];  Service: Podiatry;                Laterality: Right;  Block done by surgeon 10/31/2023: INCISION AND DRAINAGE OF WOUND; Left     Comment:  Procedure: IRRIGATION AND DEBRIDEMENT WOUND;  Surgeon:               Felecia Shelling, DPM;  Location: [facility];  Service:               Orthopedics/Podiatry;  Laterality: Left; 10/10/2021: IRRIGATION AND DEBRIDEMENT FOOT; Left     Comment:  Procedure: IRRIGATION AND DEBRIDEMENT FOOT;  Surgeon:               Asencion Islam, DPM;  Location: [facility];  Service:               Podiatry;  Laterality: Left; 11/23/2021: IRRIGATION AND DEBRIDEMENT FOOT; Left     Comment:  Procedure: IRRIGATION AND DEBRIDEMENT FOOT;  Surgeon:               Louann Sjogren, MD;  Location: [facility];  Service:               Podiatry;  Laterality: Left;  will do local block 05/23/2020: LEFT HEART CATH AND CORS/GRAFTS ANGIOGRAPHY; N/A     Comment:  Procedure: LEFT HEART CATH AND CORS/GRAFTS ANGIOGRAPHY;               Surgeon: Yvonne Kendall, MD;  [facility];  Service: Cardiovascular;  Laterality: N/A; 12/19/2020: LEFT HEART CATH AND CORS/GRAFTS ANGIOGRAPHY; N/A     Comment:  Procedure: LEFT HEART CATH AND CORS/GRAFTS ANGIOGRAPHY;               Surgeon: Kathleene Hazel, MD;  [facility]: [facility];  Service: Cardiovascular;  Laterality:               N/A; 07/21/2021: LEFT HEART CATH AND CORS/GRAFTS ANGIOGRAPHY; N/A     Comment:  Procedure: LEFT HEART CATH AND CORS/GRAFTS ANGIOGRAPHY;               Surgeon: Yvonne Kendall, MD;  [facility];  Service: Cardiovascular;  Laterality: N/A; 05/02/2020: METATARSAL HEAD EXCISION; Right     Comment:  Procedure: FIRST  METATARSAL HEAD RESECTION; RIGHT FOOT               WOUND CLOSURE;  Surgeon: Asencion Islam, DPM;  Location:              [facility];  Service: Podiatry;                Laterality: Right;  MAC W/LOCAL 10/11/2021: PERIPHERAL VASCULAR BALLOON ANGIOPLASTY; Left     Comment:  Procedure: PERIPHERAL VASCULAR BALLOON ANGIOPLASTY;                Surgeon: Cephus Shelling, MD;  [facility];  Service: Cardiovascular;  Laterality: Left;                Anterior Tibial Artery 11/27/2021: TEE WITHOUT CARDIOVERSION; N/A  Comment:  Procedure: TRANSESOPHAGEAL ECHOCARDIOGRAM (TEE);                Surgeon: Pricilla Riffle, MD;  Location: [facility] ENDOSCOPY;                Service: Cardiovascular;  Laterality: N/A; 11/23/2021: TRANSMETATARSAL AMPUTATION; Left     Comment:  Procedure: TRANSMETATARSAL AMPUTATION;  Surgeon: Louann Sjogren, MD;  Location: [facility];  Service: Podiatry;                Laterality: Left; No date: TUBAL LIGATION     Reproductive/Obstetrics negative OB ROS                              Anesthesia Physical Anesthesia Plan  ASA: 3  Anesthesia Plan: General LMA   Post-op Pain Management: Regional block* and Minimal or no pain anticipated   Induction: Intravenous  PONV Risk Score and Plan: 3 and Dexamethasone, Ondansetron and Treatment may vary due to age or medical condition  Airway Management Planned: LMA  Additional Equipment:   Intra-op Plan:   Post-operative Plan: Extubation in OR  Informed Consent: I have reviewed the patients History and Physical, chart, labs and discussed the procedure including the risks, benefits and alternatives for the proposed anesthesia with the patient or authorized representative who has indicated his/her understanding and acceptance.     Dental Advisory Given  Plan Discussed with: Anesthesiologist, CRNA and Surgeon  Anesthesia Plan Comments:           Anesthesia Quick Evaluation

## 2023-12-06 NOTE — Discharge Instructions (Addendum)
Strict nonweightbearing to the surgical extremity.  Keep dressings clean dry and intact until follow-up in the office.  Elevate extremity.  Leave boot intact.

## 2023-12-09 NOTE — Addendum Note (Signed)
Addended by: Elease Etienne A on: 12/09/2023 02:24 PM   Modules accepted: Orders

## 2023-12-09 NOTE — Progress Notes (Signed)
Remote ICD transmission.   

## 2023-12-09 NOTE — Op Note (Signed)
OPERATIVE REPORT Patient name: Jasmine Richardson MRN: [id number] DOB: 1961-04-29  DOS: 12/09/23  Preop Dx: Varus deformity of the left foot Postop Dx: same  Procedure:  1.  Tibialis anterior tendon transfer left  Surgeon: Felecia Shelling DPM  Anesthesia: 50-50 mixture of 2% lidocaine plain with 0.5% Marcaine plain totaling 20 mL infiltrated in the patient's left ankle  Hemostasis: Thigh tourniquet inflated to a pressure of after esmarch exsanguination   EBL: 50 mL Materials: Arthrex 5.54mm DX Fibertak Button System. Arthrex 6.67mm DX [employer]. Arthrex 14mm ACL Button. Injectables: None Pathology: None  Condition: The patient tolerated the procedure and anesthesia well. No complications noted or reported   Justification for procedure: The patient is a 62 y.o. female who presents today for surgical correction of varus deformity of the foot causing chronic ulceration to the plantar lateral aspect of the left forefoot.. All conservative modalities of been unsuccessful in providing any sort of satisfactory alleviation of symptoms with the patient. The patient was told benefits as well as possible side effects of the surgery. The patient consented for surgical correction. The patient consent form was reviewed. All patient questions were answered. No guarantees were expressed or implied. The patient and the surgeon both signed the patient consent form with the witness present and placed in the patient's chart.   Procedure in Detail: The patient was brought to the operating room, placed in the operating table in the supine position at which time an aseptic scrub and drape were performed about the patient's respective lower extremity after anesthesia was induced as described above. Attention was then directed to the surgical area where procedure number one commenced.  Procedure #1: Tibialis anterior tendon transfer left Attention directed to the insertion of the tibialis  anterior tendon where a 4 cm incision was planned and made at the distal insertion.  The incision was carefully carried down to the level of tendon with care taken to cut clamp ligate or retract away all small neurovascular structures traversing the incision site. The tibialis anterior tendon was reflected away at the medial cuneiform and base of the first metatarsal.   After successful release of the tibialis anterior tendon a separate incision about 3 cm long was planned and made overlying the lateral cuneiform of the left foot.  The incision was carefully carried down to the level of bone with care taken again to cut clamp ligate and retract away all small neurovascular structures traversing the incision site as well as identification of in the traversing tendons which were retracted and preserved.  Blunt soft tissue dissection was performed across the 2 incision sites and the tibialis anterior tendon was successfully translocated to the lateral incision site overlying the third cuneiform.  Whipstitch using Arthrex FiberWire was utilized and the tendon was measured to be just under 6 mm using the Arthrex measuring guide.  Decision was made to utilize a 5.5 mm DX Fibertak Button and tenodesis anchor system. The lateral cuneiform  was drilled and prepared in the standard manner as indicated on the surgical technique guide.  This was guided and verified by intraoperative x-ray fluoroscopy.  Up until this point in the surgery everything had gone flawlessly and as expected.  The FiberWire whipstitch was successfully attached to a DX [employer] which was passed through the drill hole of the lateral cuneiform.  The foot was dorsiflexed and everted and the tendon was inserted into the prepared hole.  The tendon was somewhat bulky and had to  be lightly and carefully debrided to allow for better insertion into the prepared drill hole.  Care was taken to ensure that the whipstitch was not compromised at this  time.  After the tendon was inserted and the whipstitch was tightened through the distal button, the tenodesis anchor was inserted into the hole.  While the tenodesis anchor was being tightened into the hole unfortunately the FiberWire broke at the level of the suture button on the plantar side of the cuneiform.  This no longer held the tibialis anterior tendon under tight physiologic tension.   This was very frustrating because normally Arthrex FiberWire does not break under the standard tension that was applied.  The decision was made after this to utilize a slightly larger 6.2 mm DX fibertak button system.  The decision was made to leave the smalller sized 5.55mm DX button since removing the button would cause more soft tissue damage and would cause more detriment than benefit.  Several attempts were also attempted to remove the biocomposite tenodesis anchor which appear to have broken deep within the insertion of the lateral cuneiform.  We were unable to visualize on x-ray fluoroscopy and again, the decision was made to leave the Bio-Tenodesis anchor alone after several attempts to remove it.  The hole was redrilled using the provided 6.31mm drill and a new whipstitch was applied to the distal portion of the tendon using #2 Arthrex FiberWire.  Again, the larger DX button was inserted in the same manner and as indicated in the surgical technique guide and verified by intraoperative x-ray fluoroscopy.  The foot was held in the appropriate position in the Arthrex FiberWire was inserted into the DX button and the DX button was then inserted into the predrilled lateral cuneiform and verified by intraoperative aches arthroscopy.  This was successful in pulling the tibialis anterior tendon into the predrilled hole.  The tenodesis anchor was then inserted and again, as the tendon anchor was being inserted the Arthrex FiberWire suddenly broke at the distal portion of the insertion at the level of the suture  button.  This was twice now that the FiberWire had broke.  After evaluating the situation we could only assume that the Fiberwire was being cut/compromised by the insertion guide of the tenodesis anchor.   Attempts were made at simply securing the tibialis anterior tendon transfer with the tenodesis anchor alone however this did not hold the tibialis anterior tendon securely to the lateral cuneiform.  After several failed attempts the decision was made to utilize a 14mm ACL Tightrope Button through a distally oriented plantar incision.  The whipstitch suture was passed through the plantar aspect of the skin and a 14 mm incision was made at the plantar aspect of the foot and blunt dissection down to the cuneiform to allow insertion of the ACL tight rope button.  At this time the larger 6.2 mm Dx fiber tack button was visualized and removed however the smaller 5.5 mm button was unable to be identified and removed and decision again not to remove the suture button since it did not cause any detriment to the surgical outcome for the patient.  The ACL tight rope button was visualized with x-ray fluoroscopy and the anterior tibial tendon was inserted within the predrilled hole of the lateral cuneiform.  The FiberWire was sutured and the ACL button was secured.  The 6.2 mm tenodesis anchor was also inserted into the lateral cuneiform through the dorsal incision.  After relatively frustrating surgery with the failed  FiberWire the procedure overall was successful in transferring the tibialis anterior tendon under normal physiologic tension to the lateral cuneiform.  Final intraoperative x-rays were taken and the foot was in a dorsiflexed everted position which corrected for the varus foot deformity causing lateral pressure to the amputation stump.  Dry sterile compressive dressings were then applied to all previously mentioned incision sites about the patient's lower extremity. The tourniquet which was used for  hemostasis was deflated. All normal neurovascular responses including pink color and warmth returned all the digits of patient's lower extremity.  The patient was then transferred from the operating room to the recovery room having tolerated the procedure and anesthesia well. All vital signs are stable. After a brief stay in the recovery room the patient was discharged with postoperative orders placed.     Felecia Shelling, DPM [facility]  Dr. Felecia Shelling, DPM    [address] [place], [place] [address]                Office [phone]  Fax [phone]

## 2023-12-10 ENCOUNTER — Encounter: Payer: Self-pay | Admitting: Podiatry

## 2023-12-16 ENCOUNTER — Encounter: Payer: Self-pay | Admitting: Podiatry

## 2023-12-16 ENCOUNTER — Ambulatory Visit (INDEPENDENT_AMBULATORY_CARE_PROVIDER_SITE_OTHER): Payer: BC Managed Care – PPO

## 2023-12-16 ENCOUNTER — Ambulatory Visit (INDEPENDENT_AMBULATORY_CARE_PROVIDER_SITE_OTHER): Payer: BC Managed Care – PPO | Admitting: Podiatry

## 2023-12-16 DIAGNOSIS — M778 Other enthesopathies, not elsewhere classified: Secondary | ICD-10-CM

## 2023-12-16 DIAGNOSIS — M21172 Varus deformity, not elsewhere classified, left ankle: Secondary | ICD-10-CM

## 2023-12-23 ENCOUNTER — Ambulatory Visit (INDEPENDENT_AMBULATORY_CARE_PROVIDER_SITE_OTHER): Payer: BC Managed Care – PPO | Admitting: Podiatry

## 2023-12-23 ENCOUNTER — Encounter: Payer: Self-pay | Admitting: Podiatry

## 2023-12-23 DIAGNOSIS — M21172 Varus deformity, not elsewhere classified, left ankle: Secondary | ICD-10-CM

## 2023-12-23 NOTE — Progress Notes (Signed)
 Chief Complaint  Patient presents with   Routine Post Op    Patient states that everything has been good since last visit , no medication for pain.    Subjective:  Patient presents today status post tibialis anterior tendon transfer left lower extremity.  DOS: 12/06/2023.  Patient doing well.  She continues to be NWB in the cam boot with the knee scooter.  No new complaints  Past Medical History:  Diagnosis Date   Abnormal EKG 10/23/2021   Abnormal mammogram of left breast 09/04/2021   Abnormal stress test 10/23/2021   Acquired varus deformity of foot, left    Acute chest pain 05/08/2016   AICD (automatic cardioverter/defibrillator) present    St Jude/Abbott device   AKI (acute kidney injury) (HCC) 10/23/2021   Angina pectoris (HCC) 09/11/2017   Anxiety 10/23/2021   Bacteremia 11/22/2021   Breast wound, right, subsequent encounter 09/04/2021   Burping 05/08/2016   Cancer (HCC)    cervical - hysterectomy   Cellulitis of right breast 10/23/2021   Cellulitis, wound, post-operative 10/09/2021   Chronic diastolic heart failure (HCC) 11/06/2016   Coronary artery disease involving native coronary artery of native heart with angina pectoris (HCC) 09/12/2015   Overview:  PCI and stent of RCA 2009, last cath 2012 with medical therapy  CABG May 2017   Demand ischemia [facility])    Diabetic foot infection (HCC) 11/21/2021   Diabetic foot ulcer (HCC) 02/16/2020   Emphysema lung (HCC) 09/11/2017   patient not aware of this dx   Essential hypertension 09/12/2015   Gangrene (HCC) 10/23/2021   GERD (gastroesophageal reflux disease) 05/08/2016   Hematoma 10/23/2021   Hiatal hernia 10/23/2021   History of methicillin resistant staphylococcus aureus (MRSA)    Hyperglycemia 10/23/2021   Hyperglycemia due to type 2 diabetes mellitus (HCC) 10/23/2021   Hyperkalemia    Hyperlipidemia 09/12/2015   Hyponatremia 10/23/2021   ICD (implantable cardioverter-defibrillator) in place 05/16/2021    Infection of great toe 10/23/2021   Lactic acidosis 10/23/2021   Leukocytosis 10/23/2021   Mediastinitis 06/28/2016   Morbid obesity (HCC) 05/08/2016   Myocardial infarction [facility])    NSTEMI (non-ST elevated myocardial infarction) (HCC) 05/22/2020   Obesity (BMI 30-39.9) 10/23/2021   Perineal abscess 10/23/2021   Peripheral vascular disease (HCC)    S/P CABG (coronary artery bypass graft) 06/03/2016   Overview:  The patient underwent sternal reconstruction on 06/21/16 with pec flaps for mediastinitis from a prior CABG in May 2017. On admission, she was critically ill from sepsis and had altered mental status. She was last seen in clinic on 08/09/16 at which time she was doing well.   Severe sepsis (HCC) 06/03/2016   Sinus tachycardia 05/08/2016   Tobacco use disorder 04/20/2016   Overview:  Quit in May 2017.   Type 2 diabetes mellitus with foot ulcer (HCC) 05/08/2016   Unstable angina (HCC) 05/22/2020   Ventricular tachycardia (HCC) 12/17/2020   Wound, surgical, infected 06/06/2016   Overview:  sternal    Past Surgical History:  Procedure Laterality Date   ABDOMINAL AORTOGRAM W/LOWER EXTREMITY N/A 10/11/2021   Procedure: ABDOMINAL AORTOGRAM W/LOWER EXTREMITY;  Surgeon: Gretta Lonni PARAS, MD;  [facility];  Service: Cardiovascular;  Laterality: N/A;   AMPUTATION TOE Right 02/20/2020   Procedure: AMPUTATION RIGHT GREAT  TOE;  Surgeon: Tobie Franky SQUIBB, DPM;  [facility];  Service: Podiatry;  Laterality: Right;   BLADDER SURGERY     BONE BIOPSY Left 02/01/2022   Procedure: BONE  BIOPSY LEFT FOOT, IRRIGATION AND DEBRIDEMENT;  Surgeon: Burt Fus, DPM;  [facility];  Service: Podiatry;  Laterality: Left;   BONE BIOPSY Left 10/31/2023   Procedure: BONE BIOPSY;  Surgeon: Janit Thresa HERO, DPM;  Location: [facility];  Service: Orthopedics/Podiatry;  Laterality: Left;   BUBBLE STUDY  11/27/2021   Procedure: BUBBLE STUDY;  Surgeon: Okey Vina GAILS, MD;  Location: [facility] ENDOSCOPY;   Service: Cardiovascular;;   CARDIAC CATHETERIZATION     CORONARY ARTERY BYPASS GRAFT     CORONARY STENT INTERVENTION N/A 05/25/2020   Procedure: CORONARY STENT INTERVENTION;  Surgeon: Darron Deatrice LABOR, MD;  [facility];  Service: Cardiovascular;  Laterality: N/A;   CORONARY STENT INTERVENTION N/A 06/22/2020   Procedure: CORONARY STENT INTERVENTION;  Surgeon: Anner Alm ORN, MD;  Location: [facility] INVASIVE CV LAB;  Service: Cardiovascular;  Laterality: N/A;   CORONARY ULTRASOUND/IVUS N/A 06/22/2020   Procedure: Intravascular Ultrasound/IVUS;  Surgeon: Anner Alm ORN, MD;  Location: [facility] INVASIVE CV LAB;  Service: Cardiovascular;  Laterality: N/A;   ICD IMPLANT N/A 12/19/2020   Procedure: ICD IMPLANT;  Surgeon: Waddell Danelle ORN, MD;  Location: [facility] INVASIVE CV LAB;  Service: Cardiovascular;  Laterality: N/A;   INCISION AND DRAINAGE Right 02/19/2020   Procedure: INCISION AND DRAINAGE;  Surgeon: Gershon Donnice SAUNDERS, DPM;  [facility];  Service: Podiatry;  Laterality: Right;  Block done by surgeon   INCISION AND DRAINAGE OF WOUND Left 10/31/2023   Procedure: IRRIGATION AND DEBRIDEMENT WOUND;  Surgeon: Janit Thresa HERO, DPM;  [facility]: [facility];  Service: Orthopedics/Podiatry;  Laterality: Left;   IRRIGATION AND DEBRIDEMENT FOOT Left 10/10/2021   Procedure: IRRIGATION AND DEBRIDEMENT FOOT;  Surgeon: Burt Fus, DPM;  [facility];  Service: Podiatry;  Laterality: Left;   IRRIGATION AND DEBRIDEMENT FOOT Left 11/23/2021   Procedure: IRRIGATION AND DEBRIDEMENT FOOT;  Surgeon: Joya Stabs, MD;  [facility];  Service: Podiatry;  Laterality: Left;  will do local block   LEFT HEART CATH AND CORS/GRAFTS ANGIOGRAPHY N/A 05/23/2020   Procedure: LEFT HEART CATH AND CORS/GRAFTS ANGIOGRAPHY;  Surgeon: Mady Bruckner, MD;  [facility];  Service: Cardiovascular;  Laterality: N/A;   LEFT HEART CATH AND CORS/GRAFTS ANGIOGRAPHY N/A 12/19/2020   Procedure: LEFT HEART CATH AND CORS/GRAFTS  ANGIOGRAPHY;  Surgeon: Verlin Bruckner BIRCH, MD;  [facility];  Service: Cardiovascular;  Laterality: N/A;   LEFT HEART CATH AND CORS/GRAFTS ANGIOGRAPHY N/A 07/21/2021   Procedure: LEFT HEART CATH AND CORS/GRAFTS ANGIOGRAPHY;  Surgeon: Mady Bruckner, MD;  [facility];  Service: Cardiovascular;  Laterality: N/A;   METATARSAL HEAD EXCISION Right 05/02/2020   Procedure: FIRST METATARSAL HEAD RESECTION; RIGHT FOOT WOUND CLOSURE;  Surgeon: Burt Fus, DPM;  Location: [facility];  Service: Podiatry;  Laterality: Right;  MAC W/LOCAL   PERIPHERAL VASCULAR BALLOON ANGIOPLASTY Left 10/11/2021   Procedure: PERIPHERAL VASCULAR BALLOON ANGIOPLASTY;  Surgeon: Gretta Bruckner PARAS, MD;  [facility];  Service: Cardiovascular;  Laterality: Left;  Anterior Tibial Artery   TEE WITHOUT CARDIOVERSION N/A 11/27/2021   Procedure: TRANSESOPHAGEAL ECHOCARDIOGRAM (TEE);  Surgeon: Okey Vina GAILS, MD;  Location: [facility] ENDOSCOPY;  Service: Cardiovascular;  Laterality: N/A;   TIBIALIS TENDON TRANSFER / REPAIR Left 12/06/2023   Procedure: TIBIALIS TENDON TRANSFER;  Surgeon: Janit Thresa HERO, DPM;  Location: [facility] ORS;  Service: Orthopedics/Podiatry;  Laterality: Left;   TRANSMETATARSAL AMPUTATION Left 11/23/2021   Procedure: TRANSMETATARSAL AMPUTATION;  Surgeon: Joya Stabs, MD;  [facility];  Service: Podiatry;  Laterality: Left;   TUBAL LIGATION      Allergies  Allergen Reactions   Vancomycin  Anaphylaxis   Chlorhexidine  Gluconate Itching    Received CHG bath, began itching, required benadryl    Cat Hair Extract Other (See Comments)    Sneezing, watery eyes.   Other Itching    Dial  Soap   Tramadol  Other (See Comments)    Hallucinations   Codeine Rash    LT foot 12/23/2023  Objective/Physical Exam Neurovascular status intact.  Incision well coapted with sutures and staples intact. No sign of infectious process noted. No dehiscence. No active  bleeding noted.  No edema noted to the foot  Foot is in a much more rectus alignment with correction of the varus deformity.  Please see above noted photo  Radiographic Exam 12/16/2023:  Prior history of transmetatarsal amputation.  Metallic button anchor noted to the plantar aspect of the foot.  Radiolucency to the midportion of the third cuneiform at the location of the tendon transfer.  No gas within the tissue.  Staples intact  Assessment: 1. s/p tibialis anterior tendon transfer left. DOS: 12/06/2023   Plan of Care:  -Patient was evaluated. -Staples and sutures removed -Continue strict NWB using cam boot and knee scooter.  Patient states that it is beginning to cause left hip pain using the knee scooter.  In approximately 1 week the patient may begin to slightly weight-bear in the cam boot to alleviate her left hip pain -Return to clinic 4 weeks  Thresa EMERSON Sar, DPM [facility]  Dr. Thresa EMERSON Sar, DPM    [address] [place], [place] [address]                Office [phone]  Fax [phone]

## 2024-01-01 ENCOUNTER — Other Ambulatory Visit: Payer: Self-pay

## 2024-01-01 DIAGNOSIS — E1165 Type 2 diabetes mellitus with hyperglycemia: Secondary | ICD-10-CM

## 2024-01-01 MED ORDER — DEXCOM G7 SENSOR MISC: 1.0000 | 12 refills | Status: DC

## 2024-01-06 ENCOUNTER — Encounter: Payer: BC Managed Care – PPO | Admitting: Podiatry

## 2024-01-11 ENCOUNTER — Other Ambulatory Visit: Payer: Self-pay | Admitting: Physician Assistant

## 2024-01-11 DIAGNOSIS — E0865 Diabetes mellitus due to underlying condition with hyperglycemia: Secondary | ICD-10-CM

## 2024-01-20 ENCOUNTER — Ambulatory Visit (INDEPENDENT_AMBULATORY_CARE_PROVIDER_SITE_OTHER): Payer: BC Managed Care – PPO

## 2024-01-20 ENCOUNTER — Encounter: Payer: Self-pay | Admitting: Podiatry

## 2024-01-20 ENCOUNTER — Ambulatory Visit (INDEPENDENT_AMBULATORY_CARE_PROVIDER_SITE_OTHER): Payer: BC Managed Care – PPO | Admitting: Podiatry

## 2024-01-20 DIAGNOSIS — Z9889 Other specified postprocedural states: Secondary | ICD-10-CM

## 2024-01-20 DIAGNOSIS — M21172 Varus deformity, not elsewhere classified, left ankle: Secondary | ICD-10-CM

## 2024-01-20 DIAGNOSIS — Z89432 Acquired absence of left foot: Secondary | ICD-10-CM

## 2024-01-20 DIAGNOSIS — M778 Other enthesopathies, not elsewhere classified: Secondary | ICD-10-CM

## 2024-01-20 NOTE — Progress Notes (Unsigned)
Chief Complaint  Patient presents with   Routine Post Op    Patient states that everything has been pretty good since last visit , no discomfort .    Subjective:  Patient presents today status post tibialis anterior tendon transfer left lower extremity.  DOS: 12/06/2023.  Patient doing well.  She continues to be NWB in the cam boot with the knee scooter.  No new complaints  Past Medical History:  Diagnosis Date   Abnormal EKG 10/23/2021   Abnormal mammogram of left breast 09/04/2021   Abnormal stress test 10/23/2021   Acquired varus deformity of foot, left    Acute chest pain 05/08/2016   AICD (automatic cardioverter/defibrillator) present    St Jude/Abbott device   AKI (acute kidney injury) (HCC) 10/23/2021   Angina pectoris (HCC) 09/11/2017   Anxiety 10/23/2021   Bacteremia 11/22/2021   Breast wound, right, subsequent encounter 09/04/2021   Burping 05/08/2016   Cancer (HCC)    cervical - hysterectomy   Cellulitis of right breast 10/23/2021   Cellulitis, wound, post-operative 10/09/2021   Chronic diastolic heart failure (HCC) 11/06/2016   Coronary artery disease involving native coronary artery of native heart with angina pectoris (HCC) 09/12/2015   Overview:  PCI and stent of RCA 2009, last cath 2012 with medical therapy  CABG May 2017   Demand ischemia [facility])    Diabetic foot infection (HCC) 11/21/2021   Diabetic foot ulcer (HCC) 02/16/2020   Emphysema lung (HCC) 09/11/2017   patient not aware of this dx   Essential hypertension 09/12/2015   Gangrene (HCC) 10/23/2021   GERD (gastroesophageal reflux disease) 05/08/2016   Hematoma 10/23/2021   Hiatal hernia 10/23/2021   History of methicillin resistant staphylococcus aureus (MRSA)    Hyperglycemia 10/23/2021   Hyperglycemia due to type 2 diabetes mellitus (HCC) 10/23/2021   Hyperkalemia    Hyperlipidemia 09/12/2015   Hyponatremia 10/23/2021   ICD (implantable cardioverter-defibrillator) in place 05/16/2021    Infection of great toe 10/23/2021   Lactic acidosis 10/23/2021   Leukocytosis 10/23/2021   Mediastinitis 06/28/2016   Morbid obesity (HCC) 05/08/2016   Myocardial infarction [facility])    NSTEMI (non-ST elevated myocardial infarction) (HCC) 05/22/2020   Obesity (BMI 30-39.9) 10/23/2021   Perineal abscess 10/23/2021   Peripheral vascular disease (HCC)    S/P CABG (coronary artery bypass graft) 06/03/2016   Overview:  The patient underwent sternal reconstruction on 06/21/16 with pec flaps for mediastinitis from a prior CABG in May 2017. On admission, she was critically ill from sepsis and had altered mental status. She was last seen in clinic on 08/09/16 at which time she was doing well.   Severe sepsis (HCC) 06/03/2016   Sinus tachycardia 05/08/2016   Tobacco use disorder 04/20/2016   Overview:  Quit in May 2017.   Type 2 diabetes mellitus with foot ulcer (HCC) 05/08/2016   Unstable angina (HCC) 05/22/2020   Ventricular tachycardia (HCC) 12/17/2020   Wound, surgical, infected 06/06/2016   Overview:  sternal    Past Surgical History:  Procedure Laterality Date   ABDOMINAL AORTOGRAM W/LOWER EXTREMITY N/A 10/11/2021   Procedure: ABDOMINAL AORTOGRAM W/LOWER EXTREMITY;  Surgeon: Cephus Shelling, MD;  [facility];  Service: Cardiovascular;  Laterality: N/A;   AMPUTATION TOE Right 02/20/2020   Procedure: AMPUTATION RIGHT GREAT  TOE;  Surgeon: Candelaria Stagers, DPM;  [facility];  Service: Podiatry;  Laterality: Right;   BLADDER SURGERY     BONE BIOPSY Left 02/01/2022   Procedure: BONE  BIOPSY LEFT FOOT, IRRIGATION AND DEBRIDEMENT;  Surgeon: Asencion Islam, DPM;  [facility];  Service: Podiatry;  Laterality: Left;   BONE BIOPSY Left 10/31/2023   Procedure: BONE BIOPSY;  Surgeon: Felecia Shelling, DPM;  Location: [facility];  Service: Orthopedics/Podiatry;  Laterality: Left;   BUBBLE STUDY  11/27/2021   Procedure: BUBBLE STUDY;  Surgeon: Pricilla Riffle, MD;  Location: [facility] ENDOSCOPY;   Service: Cardiovascular;;   CARDIAC CATHETERIZATION     CORONARY ARTERY BYPASS GRAFT     CORONARY STENT INTERVENTION N/A 05/25/2020   Procedure: CORONARY STENT INTERVENTION;  Surgeon: Iran Ouch, MD;  [facility];  Service: Cardiovascular;  Laterality: N/A;   CORONARY STENT INTERVENTION N/A 06/22/2020   Procedure: CORONARY STENT INTERVENTION;  Surgeon: Marykay Lex, MD;  Location: [facility] INVASIVE CV LAB;  Service: Cardiovascular;  Laterality: N/A;   CORONARY ULTRASOUND/IVUS N/A 06/22/2020   Procedure: Intravascular Ultrasound/IVUS;  Surgeon: Marykay Lex, MD;  Location: [facility] INVASIVE CV LAB;  Service: Cardiovascular;  Laterality: N/A;   ICD IMPLANT N/A 12/19/2020   Procedure: ICD IMPLANT;  Surgeon: Marinus Maw, MD;  Location: [facility] INVASIVE CV LAB;  Service: Cardiovascular;  Laterality: N/A;   INCISION AND DRAINAGE Right 02/19/2020   Procedure: INCISION AND DRAINAGE;  Surgeon: Vivi Barrack, DPM;  [facility];  Service: Podiatry;  Laterality: Right;  Block done by surgeon   INCISION AND DRAINAGE OF WOUND Left 10/31/2023   Procedure: IRRIGATION AND DEBRIDEMENT WOUND;  Surgeon: Felecia Shelling, DPM;  Location: [facility];  Service: Orthopedics/Podiatry;  Laterality: Left;   IRRIGATION AND DEBRIDEMENT FOOT Left 10/10/2021   Procedure: IRRIGATION AND DEBRIDEMENT FOOT;  Surgeon: Asencion Islam, DPM;  [facility];  Service: Podiatry;  Laterality: Left;   IRRIGATION AND DEBRIDEMENT FOOT Left 11/23/2021   Procedure: IRRIGATION AND DEBRIDEMENT FOOT;  Surgeon: Louann Sjogren, MD;  [facility];  Service: Podiatry;  Laterality: Left;  will do local block   LEFT HEART CATH AND CORS/GRAFTS ANGIOGRAPHY N/A 05/23/2020   Procedure: LEFT HEART CATH AND CORS/GRAFTS ANGIOGRAPHY;  Surgeon: Yvonne Kendall, MD;  [facility];  Service: Cardiovascular;  Laterality: N/A;   LEFT HEART CATH AND CORS/GRAFTS ANGIOGRAPHY N/A 12/19/2020   Procedure: LEFT HEART CATH AND CORS/GRAFTS  ANGIOGRAPHY;  Surgeon: Kathleene Hazel, MD;  [facility];  Service: Cardiovascular;  Laterality: N/A;   LEFT HEART CATH AND CORS/GRAFTS ANGIOGRAPHY N/A 07/21/2021   Procedure: LEFT HEART CATH AND CORS/GRAFTS ANGIOGRAPHY;  Surgeon: Yvonne Kendall, MD;  [facility];  Service: Cardiovascular;  Laterality: N/A;   METATARSAL HEAD EXCISION Right 05/02/2020   Procedure: FIRST METATARSAL HEAD RESECTION; RIGHT FOOT WOUND CLOSURE;  Surgeon: Asencion Islam, DPM;  Location: [facility];  Service: Podiatry;  Laterality: Right;  MAC W/LOCAL   PERIPHERAL VASCULAR BALLOON ANGIOPLASTY Left 10/11/2021   Procedure: PERIPHERAL VASCULAR BALLOON ANGIOPLASTY;  Surgeon: Cephus Shelling, MD;  [facility];  Service: Cardiovascular;  Laterality: Left;  Anterior Tibial Artery   TEE WITHOUT CARDIOVERSION N/A 11/27/2021   Procedure: TRANSESOPHAGEAL ECHOCARDIOGRAM (TEE);  Surgeon: Pricilla Riffle, MD;  Location: [facility] ENDOSCOPY;  Service: Cardiovascular;  Laterality: N/A;   TIBIALIS TENDON TRANSFER / REPAIR Left 12/06/2023   Procedure: TIBIALIS TENDON TRANSFER;  Surgeon: Felecia Shelling, DPM;  Location: [facility] ORS;  Service: Orthopedics/Podiatry;  Laterality: Left;   TRANSMETATARSAL AMPUTATION Left 11/23/2021   Procedure: TRANSMETATARSAL AMPUTATION;  Surgeon: Louann Sjogren, MD;  [facility];  Service: Podiatry;  Laterality: Left;   TUBAL LIGATION      Allergies  Allergen Reactions   Vancomycin Anaphylaxis   Chlorhexidine Gluconate Itching    Received CHG bath, began itching, required benadryl   Cat Dander Other (See Comments)    Sneezing, watery eyes.   Other Itching    Dial Soap   Tramadol Other (See Comments)    Hallucinations   Codeine Rash    LT foot 12/23/2023  Objective/Physical Exam Neurovascular status intact.  Incision well coapted with sutures and staples intact. No sign of infectious process noted. No dehiscence. No active bleeding  noted.  No edema noted to the foot  Foot is in a much more rectus alignment with correction of the varus deformity.  Please see above noted photo  Radiographic Exam 12/16/2023:  Prior history of transmetatarsal amputation.  Metallic button anchor noted to the plantar aspect of the foot.  Radiolucency to the midportion of the third cuneiform at the location of the tendon transfer.  No gas within the tissue.  Staples intact  Assessment: 1. s/p tibialis anterior tendon transfer left. DOS: 12/06/2023   Plan of Care:  -Patient was evaluated. -Staples and sutures removed -Continue strict NWB using cam boot and knee scooter.  Patient states that it is beginning to cause left hip pain using the knee scooter.  In approximately 1 week the patient may begin to slightly weight-bear in the cam boot to alleviate her left hip pain -Return to clinic 4 weeks  Felecia Shelling, DPM [facility]  Dr. Felecia Shelling, DPM    [address] [place], [place] [address]                Office [phone]  Fax [phone]

## 2024-01-23 ENCOUNTER — Other Ambulatory Visit: Payer: Self-pay | Admitting: Physician Assistant

## 2024-01-30 ENCOUNTER — Other Ambulatory Visit: Payer: Self-pay | Admitting: Physician Assistant

## 2024-02-05 ENCOUNTER — Ambulatory Visit (HOSPITAL_BASED_OUTPATIENT_CLINIC_OR_DEPARTMENT_OTHER)
Admission: EM | Admit: 2024-02-05 | Discharge: 2024-02-05 | Disposition: A | Discharge status: Home / Self Care | Payer: BC Managed Care – PPO | Attending: Physician Assistant | Admitting: Physician Assistant

## 2024-02-05 ENCOUNTER — Encounter (HOSPITAL_BASED_OUTPATIENT_CLINIC_OR_DEPARTMENT_OTHER): Payer: Self-pay

## 2024-02-05 DIAGNOSIS — L089 Local infection of the skin and subcutaneous tissue, unspecified: Secondary | ICD-10-CM | POA: Diagnosis not present

## 2024-02-05 MED ORDER — MUPIROCIN 2 % EX OINT: 1.0000 | TOPICAL_OINTMENT | Freq: Two times a day (BID) | CUTANEOUS | 0 refills | Status: DC

## 2024-02-05 MED ORDER — DOXYCYCLINE HYCLATE 100 MG PO CAPS: 100.0000 mg | ORAL_CAPSULE | Freq: Two times a day (BID) | ORAL | 0 refills | Status: DC

## 2024-02-05 NOTE — Discharge Instructions (Addendum)
Start doxycycline 100 mg twice daily for 10 days.  Stay out of the sun while on this medication.  Use a warm compress 2-3 times per day.  Demarcate the area of redness with a marker and if this continues to spread even after being on the antibiotics you need to be seen immediately.  Apply Bactroban ointment with dressing changes.  If this is not improving within a few days or if anything worsens and you have redness involving your entire arm, fever, nausea, vomiting, streaking, bleeding, drainage you need to be seen immediately.

## 2024-02-05 NOTE — ED Triage Notes (Signed)
Presents for eval of raised, red, hard area to antecubital area of left arm. States noticed approx 1 week ago.  Area contained to center of arm. No streaking visible.

## 2024-02-05 NOTE — ED Provider Notes (Addendum)
[facility]    CSN: [id number] Arrival date & time: 02/05/24  1629      History   Chief Complaint Chief Complaint  Patient presents with   Arm Swelling    HPI Jasmine Richardson is a 63 y.o. female.   Patient presents today with a weeklong history of erythematous lesion on her left lateral antecubital space.  She denies any known injury or insect bite.  Reports that it is slightly uncomfortable with pain rated 3 on a 0-10 pain scale but increases with flexion of the elbow.  She denies any fever, nausea, vomiting.  She has not had any drainage or bleeding from the wound.  She did have surgery approximately 3 weeks ago but reports having IV catheter in right arm and did not have any known injury or blood draw from this area.  She is currently on Augmentin for diabetic foot wound and has been on several antibiotics recently because of recurrent surgeries related to her diabetic foot ulcer.  She denies known colonization of MRSA.  She denies any fever, nausea, vomiting.  Reports the area has not spread since she noticed it.    Past Medical History:  Diagnosis Date   Abnormal EKG 10/23/2021   Abnormal mammogram of left breast 09/04/2021   Abnormal stress test 10/23/2021   Acquired varus deformity of foot, left    Acute chest pain 05/08/2016   AICD (automatic cardioverter/defibrillator) present    St Jude/Abbott device   AKI (acute kidney injury) (HCC) 10/23/2021   Angina pectoris (HCC) 09/11/2017   Anxiety 10/23/2021   Bacteremia 11/22/2021   Breast wound, right, subsequent encounter 09/04/2021   Burping 05/08/2016   Cancer (HCC)    cervical - hysterectomy   Cellulitis of right breast 10/23/2021   Cellulitis, wound, post-operative 10/09/2021   Chronic diastolic heart failure (HCC) 11/06/2016   Coronary artery disease involving native coronary artery of native heart with angina pectoris (HCC) 09/12/2015   Overview:  PCI and stent of RCA 2009, last cath 2012 with medical  therapy  CABG May 2017   Demand ischemia [facility])    Diabetic foot infection (HCC) 11/21/2021   Diabetic foot ulcer (HCC) 02/16/2020   Emphysema lung (HCC) 09/11/2017   patient not aware of this dx   Essential hypertension 09/12/2015   Gangrene (HCC) 10/23/2021   GERD (gastroesophageal reflux disease) 05/08/2016   Hematoma 10/23/2021   Hiatal hernia 10/23/2021   History of methicillin resistant staphylococcus aureus (MRSA)    Hyperglycemia 10/23/2021   Hyperglycemia due to type 2 diabetes mellitus (HCC) 10/23/2021   Hyperkalemia    Hyperlipidemia 09/12/2015   Hyponatremia 10/23/2021   ICD (implantable cardioverter-defibrillator) in place 05/16/2021   Infection of great toe 10/23/2021   Lactic acidosis 10/23/2021   Leukocytosis 10/23/2021   Mediastinitis 06/28/2016   Morbid obesity (HCC) 05/08/2016   Myocardial infarction [facility])    NSTEMI (non-ST elevated myocardial infarction) (HCC) 05/22/2020   Obesity (BMI 30-39.9) 10/23/2021   Perineal abscess 10/23/2021   Peripheral vascular disease (HCC)    S/P CABG (coronary artery bypass graft) 06/03/2016   Overview:  The patient underwent sternal reconstruction on 06/21/16 with pec flaps for mediastinitis from a prior CABG in May 2017. On admission, she was critically ill from sepsis and had altered mental status. She was last seen in clinic on 08/09/16 at which time she was doing well.   Severe sepsis (HCC) 06/03/2016   Sinus tachycardia 05/08/2016   Tobacco use disorder 04/20/2016   Overview:  Quit in May 2017.   Type 2 diabetes mellitus with foot ulcer (HCC) 05/08/2016   Unstable angina (HCC) 05/22/2020   Ventricular tachycardia (HCC) 12/17/2020   Wound, surgical, infected 06/06/2016   Overview:  sternal    Patient Active Problem List   Diagnosis Date Noted   Chronic osteomyelitis of left foot (HCC) 10/16/2023   Encounter for preoperative assessment 10/16/2023   Abnormal mammogram 08/09/2023   Acute hemorrhagic cystitis 08/09/2023    Partial nontraumatic amputation of left foot (HCC) 09/20/2022   Diabetes mellitus due to underlying condition with hyperglycemia, with long-term current use of insulin (HCC) 09/20/2022   Vitamin D deficiency 09/20/2022   Amputation of right great toe (HCC) 09/20/2022   Absolute anemia 09/20/2022   B12 deficiency 09/20/2022   Needs flu shot 09/20/2022   Colon cancer screening 09/20/2022   Intertrigo 09/20/2022   AICD (automatic cardioverter/defibrillator) present 03/02/2022   Myocardial infarction (HCC) 03/02/2022   Cancer (HCC) 02/28/2022   Bacteremia 11/22/2021   Diabetic foot infection (HCC) 11/21/2021   Abnormal stress test 10/23/2021   Abnormal EKG 10/23/2021   AKI (acute kidney injury) (HCC) 10/23/2021   Anxiety 10/23/2021   Cellulitis of right breast 10/23/2021   Gangrene (HCC) 10/23/2021   Hematoma 10/23/2021   Hiatal hernia 10/23/2021   Hyperglycemia 10/23/2021   Hyperglycemia due to type 2 diabetes mellitus (HCC) 10/23/2021   Hyponatremia 10/23/2021   Infection of great toe 10/23/2021   Lactic acidosis 10/23/2021   Leukocytosis 10/23/2021   Obesity (BMI 30-39.9) 10/23/2021   Perineal abscess 10/23/2021   Cellulitis, wound, post-operative 10/09/2021   Abnormal mammogram of left breast 09/04/2021   Breast wound, right, subsequent encounter 09/04/2021   Demand ischemia (HCC)    ICD (implantable cardioverter-defibrillator) in place 05/16/2021   Ventricular tachycardia 12/17/2020   Unstable angina (HCC) 05/22/2020   NSTEMI (non-ST elevated myocardial infarction) (HCC) 05/22/2020   Diabetic foot ulcer (HCC) 02/16/2020   Angina pectoris (HCC) 09/11/2017   Emphysema lung (HCC) 09/11/2017   Chronic diastolic heart failure (HCC) 11/06/2016   Mediastinitis 06/28/2016   Wound, surgical, infected 06/06/2016   S/P CABG (coronary artery bypass graft) 06/03/2016   Severe sepsis (HCC) 06/03/2016   GERD (gastroesophageal reflux disease) 05/08/2016   Morbid obesity (HCC)  05/08/2016   Type 2 diabetes mellitus with foot ulcer (HCC) 05/08/2016   Sinus tachycardia 05/08/2016   Acute chest pain 05/08/2016   Burping 05/08/2016   Peripheral vascular disease (HCC) 05/08/2016   Tobacco use disorder 04/20/2016   Coronary artery disease involving native coronary artery of native heart with angina pectoris (HCC) 09/12/2015   Essential hypertension 09/12/2015   Hyperlipidemia 09/12/2015    Past Surgical History:  Procedure Laterality Date   ABDOMINAL AORTOGRAM W/LOWER EXTREMITY N/A 10/11/2021   Procedure: ABDOMINAL AORTOGRAM W/LOWER EXTREMITY;  Surgeon: Cephus Shelling, MD;  [facility]: [facility];  Service: Cardiovascular;  Laterality: N/A;   AMPUTATION TOE Right 02/20/2020   Procedure: AMPUTATION RIGHT GREAT  TOE;  Surgeon: Candelaria Stagers, DPM;  Location: [facility] OR;  Service: Podiatry;  Laterality: Right;   BLADDER SURGERY     BONE BIOPSY Left 02/01/2022   Procedure: BONE BIOPSY LEFT FOOT, IRRIGATION AND DEBRIDEMENT;  Surgeon: Asencion Islam, DPM;  Location: [facility] OR;  Service: Podiatry;  Laterality: Left;   BONE BIOPSY Left 10/31/2023   Procedure: BONE BIOPSY;  Surgeon: Felecia Shelling, DPM;  Location: [facility] ORS;  Service: Orthopedics/Podiatry;  Laterality: Left;   BUBBLE STUDY  11/27/2021   Procedure: BUBBLE  STUDY;  Surgeon: Pricilla Riffle, MD;  Location: [facility] ENDOSCOPY;  Service: Cardiovascular;;   CARDIAC CATHETERIZATION     CORONARY ARTERY BYPASS GRAFT     CORONARY STENT INTERVENTION N/A 05/25/2020   Procedure: CORONARY STENT INTERVENTION;  Surgeon: Iran Ouch, MD;  [facility]: [facility];  Service: Cardiovascular;  Laterality: N/A;   CORONARY STENT INTERVENTION N/A 06/22/2020   Procedure: CORONARY STENT INTERVENTION;  Surgeon: Marykay Lex, MD;  Location: [facility] INVASIVE CV LAB;  Service: Cardiovascular;  Laterality: N/A;   CORONARY ULTRASOUND/IVUS N/A 06/22/2020   Procedure: Intravascular Ultrasound/IVUS;  Surgeon: Marykay Lex, MD;  Location: [facility]  INVASIVE CV LAB;  Service: Cardiovascular;  Laterality: N/A;   ICD IMPLANT N/A 12/19/2020   Procedure: ICD IMPLANT;  Surgeon: Marinus Maw, MD;  Location: [facility] INVASIVE CV LAB;  Service: Cardiovascular;  Laterality: N/A;   INCISION AND DRAINAGE Right 02/19/2020   Procedure: INCISION AND DRAINAGE;  Surgeon: Vivi Barrack, DPM;  Location: [facility];  Service: Podiatry;  Laterality: Right;  Block done by surgeon   INCISION AND DRAINAGE OF WOUND Left 10/31/2023   Procedure: IRRIGATION AND DEBRIDEMENT WOUND;  Surgeon: Felecia Shelling, DPM;  Location: [facility] ORS;  Service: Orthopedics/Podiatry;  Laterality: Left;   IRRIGATION AND DEBRIDEMENT FOOT Left 10/10/2021   Procedure: IRRIGATION AND DEBRIDEMENT FOOT;  Surgeon: Asencion Islam, DPM;  Location: [facility];  Service: Podiatry;  Laterality: Left;   IRRIGATION AND DEBRIDEMENT FOOT Left 11/23/2021   Procedure: IRRIGATION AND DEBRIDEMENT FOOT;  Surgeon: Louann Sjogren, MD;  Location: [facility];  Service: Podiatry;  Laterality: Left;  will do local block   LEFT HEART CATH AND CORS/GRAFTS ANGIOGRAPHY N/A 05/23/2020   Procedure: LEFT HEART CATH AND CORS/GRAFTS ANGIOGRAPHY;  Surgeon: Yvonne Kendall, MD;  [facility]: [facility];  Service: Cardiovascular;  Laterality: N/A;   LEFT HEART CATH AND CORS/GRAFTS ANGIOGRAPHY N/A 12/19/2020   Procedure: LEFT HEART CATH AND CORS/GRAFTS ANGIOGRAPHY;  Surgeon: Kathleene Hazel, MD;  [facility]: [facility];  Service: Cardiovascular;  Laterality: N/A;   LEFT HEART CATH AND CORS/GRAFTS ANGIOGRAPHY N/A 07/21/2021   Procedure: LEFT HEART CATH AND CORS/GRAFTS ANGIOGRAPHY;  Surgeon: Yvonne Kendall, MD;  [facility];  Service: Cardiovascular;  Laterality: N/A;   METATARSAL HEAD EXCISION Right 05/02/2020   Procedure: FIRST METATARSAL HEAD RESECTION; RIGHT FOOT WOUND CLOSURE;  Surgeon: Asencion Islam, DPM;  Location: [facility];  Service: Podiatry;  Laterality: Right;  MAC W/LOCAL   PERIPHERAL  VASCULAR BALLOON ANGIOPLASTY Left 10/11/2021   Procedure: PERIPHERAL VASCULAR BALLOON ANGIOPLASTY;  Surgeon: Cephus Shelling, MD;  [facility];  Service: Cardiovascular;  Laterality: Left;  Anterior Tibial Artery   TEE WITHOUT CARDIOVERSION N/A 11/27/2021   Procedure: TRANSESOPHAGEAL ECHOCARDIOGRAM (TEE);  Surgeon: Pricilla Riffle, MD;  Location: [facility] ENDOSCOPY;  Service: Cardiovascular;  Laterality: N/A;   TIBIALIS TENDON TRANSFER / REPAIR Left 12/06/2023   Procedure: TIBIALIS TENDON TRANSFER;  Surgeon: Felecia Shelling, DPM;  Location: [facility] ORS;  Service: Orthopedics/Podiatry;  Laterality: Left;   TRANSMETATARSAL AMPUTATION Left 11/23/2021   Procedure: TRANSMETATARSAL AMPUTATION;  Surgeon: Louann Sjogren, MD;  Location: [facility] OR;  Service: Podiatry;  Laterality: Left;   TUBAL LIGATION      OB History   No obstetric history on file.      Home Medications    Prior to Admission medications   Medication Sig Start Date End Date Taking? Authorizing Provider  doxycycline (VIBRAMYCIN) 100 MG capsule  Take 1 capsule (100 mg total) by mouth 2 (two) times daily. 02/05/24  Yes Cyleigh Massaro, Denny Peon K, PA-C  mupirocin ointment (BACTROBAN) 2 % Apply 1 Application topically 2 (two) times daily. 02/05/24  Yes Willo Yoon, Noberto Retort, PA-C  acetaminophen (TYLENOL) 325 MG tablet Take 2 tablets (650 mg total) by mouth every 4 (four) hours as needed for headache or mild pain. 10/14/21   Rhetta Mura, MD  amiodarone (PACERONE) 200 MG tablet Take 1 tablet by mouth once daily Patient taking differently: Take 200 mg by mouth every morning. 03/25/23   Baldo Daub, MD  amoxicillin-clavulanate (AUGMENTIN) 875-125 MG tablet Take 1 tablet by mouth 2 (two) times daily. 12/06/23   Felecia Shelling, DPM  aspirin 81 MG chewable tablet Chew 81 mg by mouth daily.    [provider]  clopidogrel (PLAVIX) 75 MG tablet Take 1 tablet (75 mg total) by mouth daily. 04/29/23   Baldo Daub, MD  Continuous Glucose  Sensor (DEXCOM G7 SENSOR) MISC 1 each by Does not apply route as directed. Change sensor every 10 days 01/01/24   Marianne Sofia, PA-C  Evolocumab (REPATHA SURECLICK) 140 MG/ML SOAJ Inject 140 mg into the skin every 14 (fourteen) days. 08/09/23   Marianne Sofia, PA-C  FARXIGA 10 MG TABS tablet Take 1 tablet by mouth once daily 01/13/24   Marianne Sofia, PA-C  furosemide (LASIX) 20 MG tablet Take 1 tablet (20 mg total) by mouth daily. Patient taking differently: Take 20 mg by mouth every other day. 08/06/22   Baldo Daub, MD  HUMALOG KWIKPEN 100 UNIT/ML KwikPen Inject 10-50 Units into the skin at bedtime. Sliding scale 12/26/22   Marianne Sofia, PA-C  insulin glargine, 2 Unit Dial, (TOUJEO MAX SOLOSTAR) 300 UNIT/ML Solostar Pen Inject 59 Units into the skin in the morning. 01/24/24   Marianne Sofia, PA-C  LORazepam (ATIVAN) 1 MG tablet Take 1 tablet (1 mg total) by mouth as needed for anxiety. 05/23/22   Marianne Sofia, PA-C  metFORMIN (GLUCOPHAGE) 500 MG tablet TAKE 1 TABLET BY MOUTH TWICE DAILY WITH A MEAL 01/30/24   Marianne Sofia, PA-C  metoprolol succinate (TOPROL-XL) 25 MG 24 hr tablet Take 1 tablet (25 mg total) by mouth in the morning, at noon, and at bedtime. 03/25/23   Baldo Daub, MD  nitroGLYCERIN (NITROSTAT) 0.4 MG SL tablet Place 1 tablet (0.4 mg total) under the tongue every 5 (five) minutes as needed for chest pain. 09/20/22   Marianne Sofia, PA-C  nystatin (MYCOSTATIN/NYSTOP) powder Apply 1 Application topically daily as needed (rash/yeast). 10/16/23   Marianne Sofia, PA-C  Omega-3 Fatty Acids (FISH OIL) 500 MG CAPS Take 500 mg by mouth at bedtime.    [provider]  omeprazole (PRILOSEC) 20 MG capsule Take 1 capsule (20 mg total) by mouth daily. Patient taking differently: Take 20 mg by mouth every morning. 08/09/23   Marianne Sofia, PA-C  oxyCODONE-acetaminophen (PERCOCET) 5-325 MG tablet Take 1 tablet by mouth every 4 (four) hours as needed for severe pain (pain score 7-10). 12/06/23   Felecia Shelling, DPM   promethazine (PHENERGAN) 25 MG tablet Take 25 mg by mouth every 6 (six) hours as needed for nausea or vomiting.    [provider]  rosuvastatin (CRESTOR) 20 MG tablet Take 1 tablet (20 mg total) by mouth daily. Patient taking differently: Take 20 mg by mouth at bedtime. 04/29/23   Baldo Daub, MD  senna-docusate (SENOKOT-S) 8.6-50 MG tablet Take 1 tablet by mouth every other  day. 11/28/21   [provider]  valsartan (DIOVAN) 80 MG tablet Take 1 tablet (80 mg total) by mouth 2 (two) times daily. 04/29/23   Baldo Daub, MD  Vitamin D, Ergocalciferol, (DRISDOL) 1.25 MG (50000 UNIT) CAPS capsule TAKE 1 CAPSULE BY MOUTH EVERY SATURDAY 10/10/23   Marianne Sofia, PA-C    Family History Family History  Problem Relation Age of Onset   Hypertension Mother    Hyperlipidemia Mother    Heart attack Father    Heart disease Father    Hypertension Father    Alzheimer's disease Father    Hypertension Brother    Hyperlipidemia Brother     Social History Social History   Tobacco Use   Smoking status: Former    Current packs/day: 0.00    Types: Cigarettes    Quit date: 05/2016    Years since quitting: 7.7   Smokeless tobacco: Never  Vaping Use   Vaping status: Never Used  Substance Use Topics   Alcohol use: Yes    Comment: rare   Drug use: No     Allergies   Vancomycin, Chlorhexidine gluconate, Cat dander, Other, Tramadol, and Codeine   Review of Systems Review of Systems  Constitutional:  Negative for activity change, appetite change, fatigue and fever.  Gastrointestinal:  Negative for abdominal pain, diarrhea, nausea and vomiting.  Musculoskeletal:  Negative for arthralgias and myalgias.  Skin:  Positive for color change and wound.  Neurological:  Negative for weakness and numbness.     Physical Exam Triage Vital Signs ED Triage Vitals  Encounter Vitals Group     BP 02/05/24 1640 (!) 158/80     Systolic BP Percentile --      Diastolic BP Percentile  --      Pulse Rate 02/05/24 1640 78     Resp 02/05/24 1640 20     Temp 02/05/24 1640 97.8 F (36.6 C)     Temp Source 02/05/24 1640 Oral     SpO2 02/05/24 1640 97 %     Weight --      Height --      Head Circumference --      Peak Flow --      Pain Score 02/05/24 1641 3     Pain Loc --      Pain Education --      Exclude from Growth Chart --    No data found.  Updated Vital Signs BP (!) 158/80 (BP Location: Right Arm)   Pulse 78   Temp 97.8 F (36.6 C) (Oral)   Resp 20   LMP  (LMP Unknown)   SpO2 97%   Visual Acuity Right Eye Distance:   Left Eye Distance:   Bilateral Distance:    Right Eye Near:   Left Eye Near:    Bilateral Near:     Physical Exam Vitals reviewed.  Constitutional:      General: She is awake. She is not in acute distress.    Appearance: Normal appearance. She is well-developed. She is not ill-appearing.     Comments: Very pleasant female appears stated age in no acute distress sitting comfortably in exam room  HENT:     Head: Normocephalic and atraumatic.  Cardiovascular:     Rate and Rhythm: Normal rate and regular rhythm.     Heart sounds: Normal heart sounds, S1 normal and S2 normal. No murmur heard.    Comments: Capillary fill within 3 seconds left fingers Pulmonary:  Effort: Pulmonary effort is normal.     Breath sounds: Normal breath sounds. No wheezing, rhonchi or rales.     Comments: Clear to auscultation bilaterally Musculoskeletal:     Left elbow: No swelling. Normal range of motion. No tenderness.     Comments: Left elbow: Normal active range of motion.  Strength 5/5 bilateral upper extremities.  Hand neurovascularly intact.  Skin:    Findings: Lesion present.          Comments: 5 cm x 2-1/2 cm erythematous nodule noted lateral antecubital space of left arm.  Significant induration without central fluctuance.  No bleeding or drainage noted.  No streaking or evidence of lymphangitis.  Psychiatric:        Behavior: Behavior is  cooperative.       UC Treatments / Results  Labs (all labs ordered are listed, but only abnormal results are displayed) Labs Reviewed - No data to display  EKG   Radiology No results found.  Procedures Procedures (including critical care time)  Medications Ordered in UC Medications - No data to display  Initial Impression / Assessment and Plan / UC Course  I have reviewed the triage vital signs and the nursing notes.  Pertinent labs & imaging results that were available during my care of the patient were reviewed by me and considered in my medical decision making (see chart for details).     Patient is well-appearing, afebrile, nontoxic, nontachycardic.  Low suspicion for superficial venous thrombosis or DVT given erythema is localized to lesion and she denies any recent trauma involving this arm.  I am concern for wound that has since become infected.  No indication for I&D as there is no fluctuance on exam.  She is currently on Augmentin but will add doxycycline for additional coverage including MRSA.  Discussed that she is to avoid prolonged sun exposure while on this medication due to associated photosensitivity.  She was encouraged to use warm compresses on the area several times per day and keep it clean with soap and water.  She is to apply Bactroban ointment which was sent to the pharmacy to be used during dressing changes.  Recommended that she demarcate area of erythema with a marker and monitor for spread of erythema despite antibiotics.  Recommend close follow-up with her primary care.  We discussed that if anything changes or worsens and she has erythema involving her arm, enlarging lesion, fever, nausea, vomiting, streaking, bleeding, increasing pain she needs to be seen emergently.  Strict return precautions given.  All questions were answered to patient satisfaction.  Final Clinical Impressions(s) / UC Diagnoses   Final diagnoses:  Skin infection     Discharge  Instructions      Start doxycycline 100 mg twice daily for 10 days.  Stay out of the sun while on this medication.  Use a warm compress 2-3 times per day.  Demarcate the area of redness with a marker and if this continues to spread even after being on the antibiotics you need to be seen immediately.  Apply Bactroban ointment with dressing changes.  If this is not improving within a few days or if anything worsens and you have redness involving your entire arm, fever, nausea, vomiting, streaking, bleeding, drainage you need to be seen immediately.     ED Prescriptions     Medication Sig Dispense Auth. Provider   mupirocin ointment (BACTROBAN) 2 % Apply 1 Application topically 2 (two) times daily. 22 g Kimora Stankovic K, PA-C  doxycycline (VIBRAMYCIN) 100 MG capsule Take 1 capsule (100 mg total) by mouth 2 (two) times daily. 20 capsule Lajarvis Italiano, Noberto Retort, PA-C      PDMP not reviewed this encounter.   Jeani Hawking, PA-C 02/05/24 1719    Schylar Allard, Noberto Retort, PA-C 02/05/24 1720

## 2024-02-14 ENCOUNTER — Ambulatory Visit (INDEPENDENT_AMBULATORY_CARE_PROVIDER_SITE_OTHER): Payer: BC Managed Care – PPO

## 2024-02-14 DIAGNOSIS — I472 Ventricular tachycardia, unspecified: Secondary | ICD-10-CM

## 2024-02-17 LAB — CUP PACEART REMOTE DEVICE CHECK
Battery Remaining Longevity: 79 mo
Battery Remaining Percentage: 71 %
Battery Voltage: 2.99 V
Brady Statistic AP VP Percent: 1 %
Brady Statistic AP VS Percent: 1.2 %
Brady Statistic AS VP Percent: 1 %
Brady Statistic AS VS Percent: 99 %
Brady Statistic RA Percent Paced: 1.2 %
Brady Statistic RV Percent Paced: 1 %
Date Time Interrogation Session: 20250228102920
HighPow Impedance: 62 Ohm
Implantable Lead Connection Status: 753985
Implantable Lead Connection Status: 753985
Implantable Lead Implant Date: 20220103
Implantable Lead Implant Date: 20220103
Implantable Lead Location: 753859
Implantable Lead Location: 753860
Implantable Pulse Generator Implant Date: 20220103
Lead Channel Impedance Value: 360 Ohm
Lead Channel Impedance Value: 400 Ohm
Lead Channel Pacing Threshold Amplitude: 1 V
Lead Channel Pacing Threshold Amplitude: 1 V
Lead Channel Pacing Threshold Pulse Width: 0.5 ms
Lead Channel Pacing Threshold Pulse Width: 0.5 ms
Lead Channel Sensing Intrinsic Amplitude: 12 mV
Lead Channel Sensing Intrinsic Amplitude: 3.7 mV
Lead Channel Setting Pacing Amplitude: 2 V
Lead Channel Setting Pacing Amplitude: 2.5 V
Lead Channel Setting Pacing Pulse Width: 0.5 ms
Lead Channel Setting Sensing Sensitivity: 0.5 mV
Pulse Gen Serial Number: 111037319

## 2024-02-18 ENCOUNTER — Other Ambulatory Visit: Payer: BC Managed Care – PPO

## 2024-02-18 ENCOUNTER — Ambulatory Visit

## 2024-02-18 VITALS — BP 130/88 | HR 90 | Temp 97.6°F | Ht 67.0 in | Wt 216.2 lb

## 2024-02-18 DIAGNOSIS — E1165 Type 2 diabetes mellitus with hyperglycemia: Secondary | ICD-10-CM | POA: Diagnosis not present

## 2024-02-18 DIAGNOSIS — L02414 Cutaneous abscess of left upper limb: Secondary | ICD-10-CM

## 2024-02-18 HISTORY — DX: Cutaneous abscess of left upper limb: L02.414

## 2024-02-18 HISTORY — DX: Type 2 diabetes mellitus with hyperglycemia: E11.65

## 2024-02-18 NOTE — Progress Notes (Signed)
 Acute Office Visit  Subjective:    [name], female    DOB: 1961-02-05, 63 y.o.   MRN: [id number]  Chief Complaint  Patient presents with   Rash    Discussed the use of AI scribe software for clinical note transcription with the patient, who gave verbal consent to proceed.      HPI: Lakasha Mcfall is a 63 year old female who presents with a rash on her left elbow and concerns about her blood sugar control.  She has a rash on her left elbow that began approximately 2-3 weeks ago. Initially, it was red and slightly painful, but now it primarily itches. She was seen at urgent care on February 19th, where it was described as a 5 cm by 2.5 cm reddish nodule with significant swelling and induration, but no central fluctuance. She was prescribed an antibiotic Doxycycline which she has been taking, and has three pills remaining. The rash has not oozed pus and is not currently painful.  She has a history of diabetes with a recent hemoglobin A1c of 10.1% on December 19th, which is an improvement from a previous level of 15%. She is concerned about her blood sugar control and mentions that her diabetes has been difficult to manage, especially following her foot surgery. She uses a Dexcom monitor, which is currently malfunctioning, and she is unsure of her current blood sugar levels. She has an upcoming appointment with her diabetes care provider at the end of March.  She underwent a tibialis anterior tendon transfer on her left foot on December 20th 2024 due to a varus deformity. She reports that she may need another surgery as a 'button' was placed in her foot to address issues from previous surgeries. She experiences some sensation returning to her feet but has lost some toes due to previous complications, including osteomyelitis and infection.  She has a family history of Alzheimer's disease, as her mother had it severely. She is concerned about her cognitive function, noting  that she sometimes struggles with thinking clearly  Past Medical History:  Diagnosis Date   Abnormal EKG 10/23/2021   Abnormal mammogram of left breast 09/04/2021   Abnormal stress test 10/23/2021   Acquired varus deformity of foot, left    Acute chest pain 05/08/2016   AICD (automatic cardioverter/defibrillator) present    St Jude/Abbott device   AKI (acute kidney injury) (HCC) 10/23/2021   Angina pectoris (HCC) 09/11/2017   Anxiety 10/23/2021   Bacteremia 11/22/2021   Breast wound, right, subsequent encounter 09/04/2021   Burping 05/08/2016   Cancer (HCC)    cervical - hysterectomy   Cellulitis of right breast 10/23/2021   Cellulitis, wound, post-operative 10/09/2021   Chronic diastolic heart failure (HCC) 11/06/2016   Coronary artery disease involving native coronary artery of native heart with angina pectoris (HCC) 09/12/2015   Overview:  PCI and stent of RCA 2009, last cath 2012 with medical therapy  CABG May 2017   Demand ischemia [facility])    Diabetic foot infection (HCC) 11/21/2021   Diabetic foot ulcer (HCC) 02/16/2020   Emphysema lung (HCC) 09/11/2017   patient not aware of this dx   Essential hypertension 09/12/2015   Gangrene (HCC) 10/23/2021   GERD (gastroesophageal reflux disease) 05/08/2016   Hematoma 10/23/2021   Hiatal hernia 10/23/2021   History of methicillin resistant staphylococcus aureus (MRSA)    Hyperglycemia 10/23/2021   Hyperglycemia due to type 2 diabetes mellitus (HCC) 10/23/2021   Hyperkalemia    Hyperlipidemia 09/12/2015  Hyponatremia 10/23/2021   ICD (implantable cardioverter-defibrillator) in place 05/16/2021   Infection of great toe 10/23/2021   Lactic acidosis 10/23/2021   Leukocytosis 10/23/2021   Mediastinitis 06/28/2016   Morbid obesity (HCC) 05/08/2016   Myocardial infarction [facility])    NSTEMI (non-ST elevated myocardial infarction) (HCC) 05/22/2020   Obesity (BMI 30-39.9) 10/23/2021   Perineal abscess 10/23/2021   Peripheral vascular  disease (HCC)    S/P CABG (coronary artery bypass graft) 06/03/2016   Overview:  The patient underwent sternal reconstruction on 06/21/16 with pec flaps for mediastinitis from a prior CABG in May 2017. On admission, she was critically ill from sepsis and had altered mental status. She was last seen in clinic on 08/09/16 at which time she was doing well.   Severe sepsis (HCC) 06/03/2016   Sinus tachycardia 05/08/2016   Tobacco use disorder 04/20/2016   Overview:  Quit in May 2017.   Type 2 diabetes mellitus with foot ulcer (HCC) 05/08/2016   Unstable angina (HCC) 05/22/2020   Ventricular tachycardia (HCC) 12/17/2020   Wound, surgical, infected 06/06/2016   Overview:  sternal    Past Surgical History:  Procedure Laterality Date   ABDOMINAL AORTOGRAM W/LOWER EXTREMITY N/A 10/11/2021   Procedure: ABDOMINAL AORTOGRAM W/LOWER EXTREMITY;  Surgeon: Cephus Shelling, MD;  [facility];  Service: Cardiovascular;  Laterality: N/A;   AMPUTATION TOE Right 02/20/2020   Procedure: AMPUTATION RIGHT GREAT  TOE;  Surgeon: Candelaria Stagers, DPM;  Location: [facility] OR;  Service: Podiatry;  Laterality: Right;   BLADDER SURGERY     BONE BIOPSY Left 02/01/2022   Procedure: BONE BIOPSY LEFT FOOT, IRRIGATION AND DEBRIDEMENT;  Surgeon: Asencion Islam, DPM;  Location: [facility];  Service: Podiatry;  Laterality: Left;   BONE BIOPSY Left 10/31/2023   Procedure: BONE BIOPSY;  Surgeon: Felecia Shelling, DPM;  Location: [facility] ORS;  Service: Orthopedics/Podiatry;  Laterality: Left;   BUBBLE STUDY  11/27/2021   Procedure: BUBBLE STUDY;  Surgeon: Pricilla Riffle, MD;  Location: [facility] ENDOSCOPY;  Service: Cardiovascular;;   CARDIAC CATHETERIZATION     CORONARY ARTERY BYPASS GRAFT     CORONARY STENT INTERVENTION N/A 05/25/2020   Procedure: CORONARY STENT INTERVENTION;  Surgeon: Iran Ouch, MD;  [facility];  Service: Cardiovascular;  Laterality: N/A;   CORONARY STENT INTERVENTION N/A 06/22/2020   Procedure:  CORONARY STENT INTERVENTION;  Surgeon: Marykay Lex, MD;  Location: [facility] INVASIVE CV LAB;  Service: Cardiovascular;  Laterality: N/A;   CORONARY ULTRASOUND/IVUS N/A 06/22/2020   Procedure: Intravascular Ultrasound/IVUS;  Surgeon: Marykay Lex, MD;  Location: [facility] INVASIVE CV LAB;  Service: Cardiovascular;  Laterality: N/A;   ICD IMPLANT N/A 12/19/2020   Procedure: ICD IMPLANT;  Surgeon: Marinus Maw, MD;  Location[facility]VASIVE CV LAB;  Service: Cardiovascular;  Laterality: N/A;   INCISION AND DRAINAGE Right 02/19/2020   Procedure: INCISION AND DRAINAGE;  Surgeon: Vivi Barrack, DPM;  [facility]: [facility];  Service: Podiatry;  Laterality: Right;  Block done by surgeon   INCISION AND DRAINAGE OF WOUND Left 10/31/2023   Procedure: IRRIGATION AND DEBRIDEMENT WOUND;  Surgeon: Felecia Shelling, DPM;  Location: [facility];  Service: Orthopedics/Podiatry;  Laterality: Left;   IRRIGATION AND DEBRIDEMENT FOOT Left 10/10/2021   Procedure: IRRIGATION AND DEBRIDEMENT FOOT;  Surgeon: Asencion Islam, DPM;  [facility];  Service: Podiatry;  Laterality: Left;   IRRIGATION AND DEBRIDEMENT FOOT Left 11/23/2021   Procedure: IRRIGATION AND DEBRIDEMENT FOOT;  Surgeon: Louann Sjogren,  MD;  [facility]: [facility];  Service: Podiatry;  Laterality: Left;  will do local block   LEFT HEART CATH AND CORS/GRAFTS ANGIOGRAPHY N/A 05/23/2020   Procedure: LEFT HEART CATH AND CORS/GRAFTS ANGIOGRAPHY;  Surgeon: Yvonne Kendall, MD;  [facility];  Service: Cardiovascular;  Laterality: N/A;   LEFT HEART CATH AND CORS/GRAFTS ANGIOGRAPHY N/A 12/19/2020   Procedure: LEFT HEART CATH AND CORS/GRAFTS ANGIOGRAPHY;  Surgeon: Kathleene Hazel, MD;  [facility];  Service: Cardiovascular;  Laterality: N/A;   LEFT HEART CATH AND CORS/GRAFTS ANGIOGRAPHY N/A 07/21/2021   Procedure: LEFT HEART CATH AND CORS/GRAFTS ANGIOGRAPHY;  Surgeon: Yvonne Kendall, MD;  [facility];  Service: Cardiovascular;   Laterality: N/A;   METATARSAL HEAD EXCISION Right 05/02/2020   Procedure: FIRST METATARSAL HEAD RESECTION; RIGHT FOOT WOUND CLOSURE;  Surgeon: Asencion Islam, DPM;  Location: [facility];  Service: Podiatry;  Laterality: Right;  MAC W/LOCAL   PERIPHERAL VASCULAR BALLOON ANGIOPLASTY Left 10/11/2021   Procedure: PERIPHERAL VASCULAR BALLOON ANGIOPLASTY;  Surgeon: Cephus Shelling, MD;  [facility];  Service: Cardiovascular;  Laterality: Left;  Anterior Tibial Artery   TEE WITHOUT CARDIOVERSION N/A 11/27/2021   Procedure: TRANSESOPHAGEAL ECHOCARDIOGRAM (TEE);  Surgeon: Pricilla Riffle, MD;  Location: [facility] ENDOSCOPY;  Service: Cardiovascular;  Laterality: N/A;   TIBIALIS TENDON TRANSFER / REPAIR Left 12/06/2023   Procedure: TIBIALIS TENDON TRANSFER;  Surgeon: Felecia Shelling, DPM;  Location: [facility] ORS;  Service: Orthopedics/Podiatry;  Laterality: Left;   TRANSMETATARSAL AMPUTATION Left 11/23/2021   Procedure: TRANSMETATARSAL AMPUTATION;  Surgeon: Louann Sjogren, MD;  Location: [facility] OR;  Service: Podiatry;  Laterality: Left;   TUBAL LIGATION      Family History  Problem Relation Age of Onset   Hypertension Mother    Hyperlipidemia Mother    Heart attack Father    Heart disease Father    Hypertension Father    Alzheimer's disease Father    Hypertension Brother    Hyperlipidemia Brother     Social History   Socioeconomic History   Marital status: Married    Spouse name: Not on file   Number of children: Not on file   Years of education: Not on file   Highest education level: Not on file  Occupational History   Not on file  Tobacco Use   Smoking status: Former    Current packs/day: 0.00    Types: Cigarettes    Quit date: 05/2016    Years since quitting: 7.7   Smokeless tobacco: Never  Vaping Use   Vaping status: Never Used  Substance and Sexual Activity   Alcohol use: Yes    Comment: rare   Drug use: No   Sexual activity: Yes    Birth  control/protection: Surgical    Comment: Hysterectomy  Other Topics Concern   Not on file  Social History Narrative   Not on file   Social Drivers of Health   Financial Resource Strain: Low Risk  (10/16/2023)   Overall Financial Resource Strain (CARDIA)    Difficulty of Paying Living Expenses: Not hard at all  Food Insecurity: No Food Insecurity (10/16/2023)   Hunger Vital Sign    Worried About Running Out of Food in the Last Year: Never true    Ran Out of Food in the Last Year: Never true  Transportation Needs: No Transportation Needs (10/16/2023)   PRAPARE - Transportation    Lack of Transportation (Medical): No    Lack  of Transportation (Non-Medical): No  Physical Activity: Inactive (10/16/2023)   Exercise Vital Sign    Days of Exercise per Week: 0 days    Minutes of Exercise per Session: 0 min  Stress: No Stress Concern Present (10/16/2023)   [employer] of Occupational Health - Occupational Stress Questionnaire    Feeling of Stress : Not at all  Social Connections: Socially Integrated (10/16/2023)   Social Connection and Isolation Panel [NHANES]    Frequency of Communication with Friends and Family: More than three times a week    Frequency of Social Gatherings with Friends and Family: More than three times a week    Attends Religious Services: More than 4 times per year    Active Member of [employer] or Organizations: Yes    Attends Engineer, structural: More than 4 times per year    Marital Status: Married  Catering manager Violence: Not At Risk (10/16/2023)   Humiliation, Afraid, Rape, and Kick questionnaire    Fear of Current or Ex-Partner: No    Emotionally Abused: No    Physically Abused: No    Sexually Abused: No    Outpatient Medications Prior to Visit  Medication Sig Dispense Refill   acetaminophen (TYLENOL) 325 MG tablet Take 2 tablets (650 mg total) by mouth every 4 (four) hours as needed for headache or mild pain.     amiodarone (PACERONE) 200  MG tablet Take 1 tablet by mouth once daily (Patient taking differently: Take 200 mg by mouth every morning.) 90 tablet 2   amoxicillin-clavulanate (AUGMENTIN) 875-125 MG tablet Take 1 tablet by mouth 2 (two) times daily. 14 tablet 0   aspirin 81 MG chewable tablet Chew 81 mg by mouth daily.     clopidogrel (PLAVIX) 75 MG tablet Take 1 tablet (75 mg total) by mouth daily. 90 tablet 2   Continuous Glucose Sensor (DEXCOM G7 SENSOR) MISC 1 each by Does not apply route as directed. Change sensor every 10 days 3 each 12   doxycycline (VIBRAMYCIN) 100 MG capsule Take 1 capsule (100 mg total) by mouth 2 (two) times daily. 20 capsule 0   Evolocumab (REPATHA SURECLICK) 140 MG/ML SOAJ Inject 140 mg into the skin every 14 (fourteen) days. 2 mL 1   FARXIGA 10 MG TABS tablet Take 1 tablet by mouth once daily 90 tablet 0   furosemide (LASIX) 20 MG tablet Take 1 tablet (20 mg total) by mouth daily. (Patient taking differently: Take 20 mg by mouth every other day.) 90 tablet 3   HUMALOG KWIKPEN 100 UNIT/ML KwikPen Inject 10-50 Units into the skin at bedtime. Sliding scale 15 mL 12   insulin glargine, 2 Unit Dial, (TOUJEO MAX SOLOSTAR) 300 UNIT/ML Solostar Pen Inject 59 Units into the skin in the morning. 3 mL 0   LORazepam (ATIVAN) 1 MG tablet Take 1 tablet (1 mg total) by mouth as needed for anxiety. 30 tablet 0   metFORMIN (GLUCOPHAGE) 500 MG tablet TAKE 1 TABLET BY MOUTH TWICE DAILY WITH A MEAL 180 tablet 0   metoprolol succinate (TOPROL-XL) 25 MG 24 hr tablet Take 1 tablet (25 mg total) by mouth in the morning, at noon, and at bedtime. 270 tablet 2   mupirocin ointment (BACTROBAN) 2 % Apply 1 Application topically 2 (two) times daily. 22 g 0   nitroGLYCERIN (NITROSTAT) 0.4 MG SL tablet Place 1 tablet (0.4 mg total) under the tongue every 5 (five) minutes as needed for chest pain. 25 tablet 6   nystatin (MYCOSTATIN/NYSTOP)  powder Apply 1 Application topically daily as needed (rash/yeast). 15 g 2   Omega-3 Fatty  Acids (FISH OIL) 500 MG CAPS Take 500 mg by mouth at bedtime.     omeprazole (PRILOSEC) 20 MG capsule Take 1 capsule (20 mg total) by mouth daily. (Patient taking differently: Take 20 mg by mouth every morning.) 90 capsule 1   oxyCODONE-acetaminophen (PERCOCET) 5-325 MG tablet Take 1 tablet by mouth every 4 (four) hours as needed for severe pain (pain score 7-10). 30 tablet 0   promethazine (PHENERGAN) 25 MG tablet Take 25 mg by mouth every 6 (six) hours as needed for nausea or vomiting.     rosuvastatin (CRESTOR) 20 MG tablet Take 1 tablet (20 mg total) by mouth daily. (Patient taking differently: Take 20 mg by mouth at bedtime.) 90 tablet 2   senna-docusate (SENOKOT-S) 8.6-50 MG tablet Take 1 tablet by mouth every other day.     valsartan (DIOVAN) 80 MG tablet Take 1 tablet (80 mg total) by mouth 2 (two) times daily. 180 tablet 2   Vitamin D, Ergocalciferol, (DRISDOL) 1.25 MG (50000 UNIT) CAPS capsule TAKE 1 CAPSULE BY MOUTH EVERY SATURDAY 12 capsule 0   No facility-administered medications prior to visit.    Allergies  Allergen Reactions   Vancomycin Anaphylaxis   Chlorhexidine Gluconate Itching    Received CHG bath, began itching, required benadryl   Cat Dander Other (See Comments)    Sneezing, watery eyes.   Other Itching    Dial Soap   Tramadol Other (See Comments)    Hallucinations   Codeine Rash    Review of Systems  Constitutional:  Negative for chills, fatigue and fever.  HENT:  Negative for congestion, ear pain and sore throat.   Respiratory:  Negative for cough and shortness of breath.   Cardiovascular:  Negative for chest pain.  Gastrointestinal:  Negative for abdominal pain, constipation, diarrhea, nausea and vomiting.  Genitourinary:  Negative for dysuria and frequency.  Musculoskeletal:  Negative for arthralgias and myalgias.  Skin:  Positive for rash (left antecubital space).  Neurological:  Negative for dizziness and headaches.  Psychiatric/Behavioral:  Negative  for dysphoric mood. The patient is not nervous/anxious.        Objective:        02/18/2024    3:32 PM 02/05/2024    4:40 PM 12/06/2023    7:34 PM  Vitals with BMI  Height 5\' 7"     Weight 216 lbs 3 oz    BMI 33.85    Systolic 130 158 161  Diastolic 88 80 71  Pulse 90 78 72    No data found.   Physical Exam Vitals and nursing note reviewed.  HENT:     Head: Normocephalic and atraumatic.  Cardiovascular:     Rate and Rhythm: Normal rate and regular rhythm.     Comments: Sternotomy scar noted on chest wall Pulmonary:     Effort: Pulmonary effort is normal.     Breath sounds: Normal breath sounds.  Musculoskeletal:     Comments: Absent toes on left foot. Stump noted  Absent right great toe  Onychomycosis of the remaining toe nails  Skin:    General: Skin is warm.     Comments: Possibly resolving abscess noted in left antecubital space. Non tender. PICTURE ATTACHED. Mild fluctuance noted.   Neurological:     Mental Status: She is alert.  Psychiatric:        Mood and Affect: Mood normal.  Health Maintenance Due  Topic Date Due   Pneumococcal Vaccine 51-65 Years old (1 of 2 - PCV) Never done   Cervical Cancer Screening (HPV/Pap Cotest)  Never done   Colonoscopy  12/05/2018   COVID-19 Vaccine (3 - Pfizer risk series) 10/31/2020   OPHTHALMOLOGY EXAM  05/11/2023   Zoster Vaccines- Shingrix (2 of 2) 09/08/2023   Diabetic kidney evaluation - Urine ACR  09/21/2023   FOOT EXAM  09/21/2023    There are no preventive care reminders to display for this patient.   Lab Results  Component Value Date   TSH 1.850 08/09/2023   Lab Results  Component Value Date   WBC 8.5 12/05/2023   HGB 13.2 12/05/2023   HCT 41.0 12/05/2023   MCV 89 12/05/2023   PLT 254 12/05/2023   Lab Results  Component Value Date   NA 139 12/05/2023   K 4.9 12/05/2023   CO2 23 12/05/2023   GLUCOSE 190 (H) 12/05/2023   BUN 24 12/05/2023   CREATININE 1.17 (H) 12/05/2023   BILITOT 0.3  12/05/2023   ALKPHOS 66 12/05/2023   AST 13 12/05/2023   ALT 14 12/05/2023   PROT 7.1 12/05/2023   ALBUMIN 4.1 12/05/2023   CALCIUM 10.1 12/05/2023   ANIONGAP 8 10/09/2023   EGFR 52 (L) 12/05/2023   Lab Results  Component Value Date   CHOL 216 (H) 08/09/2023   Lab Results  Component Value Date   HDL 66 08/09/2023   Lab Results  Component Value Date   LDLCALC 108 (H) 08/09/2023   Lab Results  Component Value Date   TRIG 248 (H) 08/09/2023   Lab Results  Component Value Date   CHOLHDL 3.3 08/09/2023   Lab Results  Component Value Date   HGBA1C 10.1 (H) 12/05/2023       Assessment & Plan:  Abscess of left elbow Assessment & Plan: Presents with a week-long history of erythematous lesion in the left elbow space, initially painful and now pruritic. Examination previously revealed a 5 cm x 2.5 cm indurated nodule without central fluctuance. Previously prescribed Augmentin, which appears effective as the lesion is improving. Discussed potential need for further intervention if the lesion persists or worsens in a month.  TODAY, measured 3- 3.5 cm, non tender abscess.   - Apply moist heat to the affected area - Massage the area to promote drainage or resolution - Reassess in one month if the lesion persists or worsens  Orders: -     Comprehensive metabolic panel -     Hemoglobin A1c  Uncontrolled type 2 diabetes mellitus with hyperglycemia (HCC) Assessment & Plan: Poorly controlled diabetes with a recent HbA1c of 10.1% on December 19th. Reports previous HbA1c as high as 15% and has been working on improving it. Current blood glucose monitoring is compromised due to a malfunctioning Dexcom monitor. Discussed the importance of maintaining HbA1c below 8% and the impact of diabetes on overall health, including cognitive function. - Order HbA1c test today - Encourage dietary modifications and increased physical activity - Follow up with diabetes educator Jule Ser. - Adjust  insulin regimen based on HbA1c results - Follow up with primary care provider Kennon Rounds on March 28th  Orders: -     Comprehensive metabolic panel -     Hemoglobin A1c     Assessment and Plan       No orders of the defined types were placed in this encounter.   Orders Placed This Encounter  Procedures   Comprehensive metabolic panel  Hemoglobin A1c     Follow-up: No follow-ups on file.  An After Visit Summary was printed and given to the patient.  Windell Moment, MD [facility] [phone]

## 2024-02-18 NOTE — Assessment & Plan Note (Signed)
 Poorly controlled diabetes with a recent HbA1c of 10.1% on December 19th. Reports previous HbA1c as high as 15% and has been working on improving it. Current blood glucose monitoring is compromised due to a malfunctioning Dexcom monitor. Discussed the importance of maintaining HbA1c below 8% and the impact of diabetes on overall health, including cognitive function. - Order HbA1c test today - Encourage dietary modifications and increased physical activity - Follow up with diabetes educator Jule Ser. - Adjust insulin regimen based on HbA1c results - Follow up with primary care provider Kennon Rounds on March 28th

## 2024-02-18 NOTE — Assessment & Plan Note (Signed)
 Presents with a week-long history of erythematous lesion in the left elbow space, initially painful and now pruritic. Examination previously revealed a 5 cm x 2.5 cm indurated nodule without central fluctuance. Previously prescribed Augmentin, which appears effective as the lesion is improving. Discussed potential need for further intervention if the lesion persists or worsens in a month.  TODAY, measured 3- 3.5 cm, non tender abscess.   - Apply moist heat to the affected area - Massage the area to promote drainage or resolution - Reassess in one month if the lesion persists or worsens

## 2024-02-18 NOTE — Patient Instructions (Signed)
 VISIT SUMMARY:  Today, we discussed your rash, blood sugar control, and overall health. Your rash on the left elbow is improving with the current antibiotic treatment. We also reviewed your diabetes management and the importance of maintaining good blood sugar levels. Additionally, we talked about your post-surgical status and general health maintenance, including cognitive health.  YOUR PLAN:  -ABSCESS OF LEFT ELBOW: An abscess is a collection of pus that has built up within the tissue of the body. Your rash is improving with the antibiotic, but you should apply moist heat and massage the area to help it heal. If it doesn't get better in a month, we may need to reassess it.  -TYPE 2 DIABETES MELLITUS: Type 2 diabetes is a condition that affects the way your body processes blood sugar. Your recent HbA1c level was 10.1%, which is an improvement but still higher than the recommended level of below 8%. We will order an HbA1c test today and adjust your insulin regimen based on the results. Please continue with dietary modifications and increased physical activity. Follow up with your diabetes educator and primary care provider as scheduled.  -OSTEOMYELITIS: Osteomyelitis is an infection in the bone. You have a history of this condition, which has led to multiple foot surgeries. Currently, there is no active infection, but it's important to maintain good blood sugar control to prevent complications. Monitor for any signs of recurrent infection.  -POST-SURGICAL STATUS OF LEFT FOOT: You recently had surgery on your left foot to correct a deformity. You are healing well but should monitor for any signs of complications. Gradually increase your physical activity as tolerated.  -GENERAL HEALTH MAINTENANCE: We discussed your concerns about cognitive function and family history of Alzheimer's disease. To maintain cognitive health, engage in activities like puzzles and reading, maintain a healthy diet, and exercise  regularly.  INSTRUCTIONS:  Please follow up with your primary care provider, Kennon Rounds, on March 28th. Schedule an appointment with a cardiologist and complete lab work for your HbA1c test today.

## 2024-02-19 ENCOUNTER — Encounter: Payer: Self-pay | Admitting: Internal Medicine

## 2024-02-19 LAB — COMPREHENSIVE METABOLIC PANEL
ALT: 15 IU/L (ref 0–32)
AST: 14 IU/L (ref 0–40)
Albumin: 4 g/dL (ref 3.9–4.9)
Alkaline Phosphatase: 78 IU/L (ref 44–121)
BUN/Creatinine Ratio: 20 (ref 12–28)
BUN: 19 mg/dL (ref 8–27)
Bilirubin Total: 0.3 mg/dL (ref 0.0–1.2)
CO2: 24 mmol/L (ref 20–29)
Calcium: 10.3 mg/dL (ref 8.7–10.3)
Chloride: 102 mmol/L (ref 96–106)
Creatinine, Ser: 0.95 mg/dL (ref 0.57–1.00)
Globulin, Total: 3.1 g/dL (ref 1.5–4.5)
Glucose: 263 mg/dL — ABNORMAL HIGH (ref 70–99)
Potassium: 5.7 mmol/L — ABNORMAL HIGH (ref 3.5–5.2)
Sodium: 138 mmol/L (ref 134–144)
Total Protein: 7.1 g/dL (ref 6.0–8.5)
eGFR: 67 mL/min/{1.73_m2} (ref >59)

## 2024-02-19 LAB — HEMOGLOBIN A1C
Est. average glucose Bld gHb Est-mCnc: 223 mg/dL
Hgb A1c MFr Bld: 9.4 % — ABNORMAL HIGH (ref 4.8–5.6)

## 2024-02-26 ENCOUNTER — Other Ambulatory Visit: Payer: Self-pay | Admitting: Physician Assistant

## 2024-03-04 ENCOUNTER — Ambulatory Visit: Payer: BC Managed Care – PPO | Admitting: Podiatry

## 2024-03-13 ENCOUNTER — Encounter: Payer: Self-pay | Admitting: Physician Assistant

## 2024-03-13 ENCOUNTER — Ambulatory Visit: Admitting: Physician Assistant

## 2024-03-13 VITALS — BP 110/80 | HR 62 | Temp 98.2°F | Resp 18 | Ht 67.0 in | Wt 224.4 lb

## 2024-03-13 DIAGNOSIS — E559 Vitamin D deficiency, unspecified: Secondary | ICD-10-CM | POA: Diagnosis not present

## 2024-03-13 DIAGNOSIS — E782 Mixed hyperlipidemia: Secondary | ICD-10-CM

## 2024-03-13 DIAGNOSIS — I10 Essential (primary) hypertension: Secondary | ICD-10-CM

## 2024-03-13 DIAGNOSIS — E538 Deficiency of other specified B group vitamins: Secondary | ICD-10-CM

## 2024-03-13 DIAGNOSIS — I25119 Atherosclerotic heart disease of native coronary artery with unspecified angina pectoris: Secondary | ICD-10-CM

## 2024-03-13 DIAGNOSIS — I110 Hypertensive heart disease with heart failure: Secondary | ICD-10-CM

## 2024-03-13 DIAGNOSIS — Z794 Long term (current) use of insulin: Secondary | ICD-10-CM

## 2024-03-13 DIAGNOSIS — I5032 Chronic diastolic (congestive) heart failure: Secondary | ICD-10-CM

## 2024-03-13 DIAGNOSIS — M86672 Other chronic osteomyelitis, left ankle and foot: Secondary | ICD-10-CM

## 2024-03-13 DIAGNOSIS — E1165 Type 2 diabetes mellitus with hyperglycemia: Secondary | ICD-10-CM

## 2024-03-13 DIAGNOSIS — L97909 Non-pressure chronic ulcer of unspecified part of unspecified lower leg with unspecified severity: Secondary | ICD-10-CM

## 2024-03-13 MED ORDER — TOUJEO MAX SOLOSTAR 300 UNIT/ML ~~LOC~~ SOPN
62.0000 [IU] | PEN_INJECTOR | Freq: Every day | SUBCUTANEOUS | Status: DC
Start: 2024-03-13 — End: 2024-03-30

## 2024-03-13 MED ORDER — REPATHA SURECLICK 140 MG/ML ~~LOC~~ SOAJ: 140.0000 mg | SUBCUTANEOUS | 1 refills | Status: DC

## 2024-03-13 NOTE — Progress Notes (Signed)
 Subjective:  [name], female    DOB: [birth year]  Age: 63 y.o. MRN: [id number]  Chief Complaint  Patient presents with   Medical Management of Chronic Issues    Diabetes  Hypertension    Jasmine Richardson has a history of type 2 Diabetes Mellitus for more than 20 years. Current treatment includes toujeo 59 units qd.  humalog sliding scale, farxiga and glucophage   She denies hypoglycemic episodes. She states blood glucose levels at home have been doing better.  Recent Hgb A1c done on 3/4 was 9.4 which was improved.   . She is following with [facility] every 2 weeks for foot care and recently had surgery Patient is now wearing CGM  Pt with history of hyperlipidemia and CAD- currently also follows with cardiology.  Treatment includes , tricor, fish oil and crestor 20mg  qd- Once again she states that she has been off of Repatha for almost a year because she never got it refilled despite this med was sent in at her last chronic visit in August (pt no showed for other appt in November)  Pt with history of vit D disorder - taking weekly supplement and due for labwork  Pt with history of GERD - stable on omeprazole 20mg  qd -  Pt follows with cardiology on a regular basis.  She sees Dr Dulce Sellar for history of CAD, chronic diastolic heart failure,and history of vtach.  She is currently on amiodarone therapy and has implantable defibrillator (which is monitored regularly).  She is also on  metoprolol, lasix, plavix,  Pt with history of hypertension as well and also taking diovan 80mg  qd Has appt next month  Pt with history of anxiety - stable on lorazepam 1mg  as needed   Lab Results  Component Value Date   HGBA1C 9.4 (H) 02/18/2024   HGBA1C 10.1 (H) 12/05/2023   HGBA1C 8.5 (H) 10/09/2023    Current Outpatient Medications on File Prior to Visit  Medication Sig Dispense Refill   acetaminophen (TYLENOL) 325 MG tablet Take 2 tablets (650 mg total) by mouth every 4 (four) hours  as needed for headache or mild pain.     amiodarone (PACERONE) 200 MG tablet Take 1 tablet by mouth once daily (Patient taking differently: Take 200 mg by mouth every morning.) 90 tablet 2   aspirin 81 MG chewable tablet Chew 81 mg by mouth daily.     clopidogrel (PLAVIX) 75 MG tablet Take 1 tablet (75 mg total) by mouth daily. 90 tablet 2   Continuous Glucose Sensor (DEXCOM G7 SENSOR) MISC 1 each by Does not apply route as directed. Change sensor every 10 days 3 each 12   FARXIGA 10 MG TABS tablet Take 1 tablet by mouth once daily 90 tablet 0   furosemide (LASIX) 20 MG tablet Take 1 tablet (20 mg total) by mouth daily. (Patient taking differently: Take 20 mg by mouth every other day.) 90 tablet 3   HUMALOG KWIKPEN 100 UNIT/ML KwikPen Inject 10-50 Units into the skin at bedtime. Sliding scale 15 mL 12   LORazepam (ATIVAN) 1 MG tablet Take 1 tablet (1 mg total) by mouth as needed for anxiety. 30 tablet 0   metFORMIN (GLUCOPHAGE) 500 MG tablet TAKE 1 TABLET BY MOUTH TWICE DAILY WITH A MEAL 180 tablet 0   metoprolol succinate (TOPROL-XL) 25 MG 24 hr tablet Take 1 tablet (25 mg total) by mouth in the morning, at noon, and at bedtime. 270 tablet 2   mupirocin ointment (BACTROBAN)  2 % Apply 1 Application topically 2 (two) times daily. 22 g 0   nitroGLYCERIN (NITROSTAT) 0.4 MG SL tablet Place 1 tablet (0.4 mg total) under the tongue every 5 (five) minutes as needed for chest pain. 25 tablet 6   nystatin (MYCOSTATIN/NYSTOP) powder Apply 1 Application topically daily as needed (rash/yeast). 15 g 2   Omega-3 Fatty Acids (FISH OIL) 500 MG CAPS Take 500 mg by mouth at bedtime.     omeprazole (PRILOSEC) 20 MG capsule Take 1 capsule (20 mg total) by mouth daily. (Patient taking differently: Take 20 mg by mouth every morning.) 90 capsule 1   promethazine (PHENERGAN) 25 MG tablet Take 25 mg by mouth every 6 (six) hours as needed for nausea or vomiting.     rosuvastatin (CRESTOR) 20 MG tablet Take 1 tablet (20 mg  total) by mouth daily. (Patient taking differently: Take 20 mg by mouth at bedtime.) 90 tablet 2   senna-docusate (SENOKOT-S) 8.6-50 MG tablet Take 1 tablet by mouth every other day.     valsartan (DIOVAN) 80 MG tablet Take 1 tablet (80 mg total) by mouth 2 (two) times daily. 180 tablet 2   Vitamin D, Ergocalciferol, (DRISDOL) 1.25 MG (50000 UNIT) CAPS capsule TAKE 1 CAPSULE BY MOUTH EVERY SATURDAY 12 capsule 0   No current facility-administered medications on file prior to visit.   Past Medical History:  Diagnosis Date   Abnormal EKG 10/23/2021   Abnormal mammogram of left breast 09/04/2021   Abnormal stress test 10/23/2021   Acquired varus deformity of foot, left    Acute chest pain 05/08/2016   AICD (automatic cardioverter/defibrillator) present    St Jude/Abbott device   AKI (acute kidney injury) (HCC) 10/23/2021   Angina pectoris (HCC) 09/11/2017   Anxiety 10/23/2021   Bacteremia 11/22/2021   Breast wound, right, subsequent encounter 09/04/2021   Burping 05/08/2016   Cancer (HCC)    cervical - hysterectomy   Cellulitis of right breast 10/23/2021   Cellulitis, wound, post-operative 10/09/2021   Chronic diastolic heart failure (HCC) 11/06/2016   Coronary artery disease involving native coronary artery of native heart with angina pectoris (HCC) 09/12/2015   Overview:  PCI and stent of RCA 2009, last cath 2012 with medical therapy  CABG May 2017   Demand ischemia [facility])    Diabetic foot infection (HCC) 11/21/2021   Diabetic foot ulcer (HCC) 02/16/2020   Emphysema lung (HCC) 09/11/2017   patient not aware of this dx   Essential hypertension 09/12/2015   Gangrene (HCC) 10/23/2021   GERD (gastroesophageal reflux disease) 05/08/2016   Hematoma 10/23/2021   Hiatal hernia 10/23/2021   History of methicillin resistant staphylococcus aureus (MRSA)    Hyperglycemia 10/23/2021   Hyperglycemia due to type 2 diabetes mellitus (HCC) 10/23/2021   Hyperkalemia    Hyperlipidemia 09/12/2015    Hyponatremia 10/23/2021   ICD (implantable cardioverter-defibrillator) in place 05/16/2021   Infection of great toe 10/23/2021   Lactic acidosis 10/23/2021   Leukocytosis 10/23/2021   Mediastinitis 06/28/2016   Morbid obesity (HCC) 05/08/2016   Myocardial infarction [facility])    NSTEMI (non-ST elevated myocardial infarction) (HCC) 05/22/2020   Obesity (BMI 30-39.9) 10/23/2021   Perineal abscess 10/23/2021   Peripheral vascular disease (HCC)    S/P CABG (coronary artery bypass graft) 06/03/2016   Overview:  The patient underwent sternal reconstruction on 06/21/16 with pec flaps for mediastinitis from a prior CABG in May 2017. On admission, she was critically ill from sepsis and had altered mental status. She was  last seen in clinic on 08/09/16 at which time she was doing well.   Severe sepsis (HCC) 06/03/2016   Sinus tachycardia 05/08/2016   Tobacco use disorder 04/20/2016   Overview:  Quit in May 2017.   Type 2 diabetes mellitus with foot ulcer (HCC) 05/08/2016   Unstable angina (HCC) 05/22/2020   Ventricular tachycardia (HCC) 12/17/2020   Wound, surgical, infected 06/06/2016   Overview:  sternal   Past Surgical History:  Procedure Laterality Date   ABDOMINAL AORTOGRAM W/LOWER EXTREMITY N/A 10/11/2021   Procedure: ABDOMINAL AORTOGRAM W/LOWER EXTREMITY;  Surgeon: Cephus Shelling, MD;  [facility];  Service: Cardiovascular;  Laterality: N/A;   AMPUTATION TOE Right 02/20/2020   Procedure: AMPUTATION RIGHT GREAT  TOE;  Surgeon: Candelaria Stagers, DPM;  Location: [facility] OR;  Service: Podiatry;  Laterality: Right;   BLADDER SURGERY     BONE BIOPSY Left 02/01/2022   Procedure: BONE BIOPSY LEFT FOOT, IRRIGATION AND DEBRIDEMENT;  Surgeon: Asencion Islam, DPM;  Location: [facility] OR;  Service: Podiatry;  Laterality: Left;   BONE BIOPSY Left 10/31/2023   Procedure: BONE BIOPSY;  Surgeon: Felecia Shelling, DPM;  Location: [facility] ORS;  Service: Orthopedics/Podiatry;  Laterality: Left;   BUBBLE  STUDY  11/27/2021   Procedure: BUBBLE STUDY;  Surgeon: Pricilla Riffle, MD;  Location: [facility] ENDOSCOPY;  Service: Cardiovascular;;   CARDIAC CATHETERIZATION     CORONARY ARTERY BYPASS GRAFT     CORONARY STENT INTERVENTION N/A 05/25/2020   Procedure: CORONARY STENT INTERVENTION;  Surgeon: Iran Ouch, MD;  [facility];  Service: Cardiovascular;  Laterality: N/A;   CORONARY STENT INTERVENTION N/A 06/22/2020   Procedure: CORONARY STENT INTERVENTION;  Surgeon: Marykay Lex, MD;  Location: [facility] INVASIVE CV LAB;  Service: Cardiovascular;  Laterality: N/A;   CORONARY ULTRASOUND/IVUS N/A 06/22/2020   Procedure: Intravascular Ultrasound/IVUS;  Surgeon: Marykay Lex, MD;  Location: [facility] INVASIVE CV LAB;  Service: Cardiovascular;  Laterality: N/A;   ICD IMPLANT N/A 12/19/2020   Procedure: ICD IMPLANT;  Surgeon: Marinus Maw, MD;  Location: [facility] INVASIVE CV LAB;  Service: Cardiovascular;  Laterality: N/A;   INCISION AND DRAINAGE Right 02/19/2020   Procedure: INCISION AND DRAINAGE;  Surgeon: Vivi Barrack, DPM;  [facility]: [facility];  Service: Podiatry;  Laterality: Right;  Block done by surgeon   INCISION AND DRAINAGE OF WOUND Left 10/31/2023   Procedure: IRRIGATION AND DEBRIDEMENT WOUND;  Surgeon: Felecia Shelling, DPM;  Location: [facility] ORS;  Service: Orthopedics/Podiatry;  Laterality: Left;   IRRIGATION AND DEBRIDEMENT FOOT Left 10/10/2021   Procedure: IRRIGATION AND DEBRIDEMENT FOOT;  Surgeon: Asencion Islam, DPM;  [facility];  Service: Podiatry;  Laterality: Left;   IRRIGATION AND DEBRIDEMENT FOOT Left 11/23/2021   Procedure: IRRIGATION AND DEBRIDEMENT FOOT;  Surgeon: Louann Sjogren, MD;  Location: [facility];  Service: Podiatry;  Laterality: Left;  will do local block   LEFT HEART CATH AND CORS/GRAFTS ANGIOGRAPHY N/A 05/23/2020   Procedure: LEFT HEART CATH AND CORS/GRAFTS ANGIOGRAPHY;  Surgeon: Yvonne Kendall, MD;  [facility];  Service: Cardiovascular;  Laterality: N/A;    LEFT HEART CATH AND CORS/GRAFTS ANGIOGRAPHY N/A 12/19/2020   Procedure: LEFT HEART CATH AND CORS/GRAFTS ANGIOGRAPHY;  Surgeon: Kathleene Hazel, MD;  [facility];  Service: Cardiovascular;  Laterality: N/A;   LEFT HEART CATH AND CORS/GRAFTS ANGIOGRAPHY N/A 07/21/2021   Procedure: LEFT HEART CATH AND CORS/GRAFTS ANGIOGRAPHY;  Surgeon: Yvonne Kendall, MD;  [facility]  [facility];  Service: Cardiovascular;  Laterality: N/A;   METATARSAL HEAD EXCISION Right 05/02/2020   Procedure: FIRST METATARSAL HEAD RESECTION; RIGHT FOOT WOUND CLOSURE;  Surgeon: Asencion Islam, DPM;  Location: [facility];  Service: Podiatry;  Laterality: Right;  MAC W/LOCAL   PERIPHERAL VASCULAR BALLOON ANGIOPLASTY Left 10/11/2021   Procedure: PERIPHERAL VASCULAR BALLOON ANGIOPLASTY;  Surgeon: Cephus Shelling, MD;  [facility];  Service: Cardiovascular;  Laterality: Left;  Anterior Tibial Artery   TEE WITHOUT CARDIOVERSION N/A 11/27/2021   Procedure: TRANSESOPHAGEAL ECHOCARDIOGRAM (TEE);  Surgeon: Pricilla Riffle, MD;  Location: [facility] ENDOSCOPY;  Service: Cardiovascular;  Laterality: N/A;   TIBIALIS TENDON TRANSFER / REPAIR Left 12/06/2023   Procedure: TIBIALIS TENDON TRANSFER;  Surgeon: Felecia Shelling, DPM;  Location: [facility] ORS;  Service: Orthopedics/Podiatry;  Laterality: Left;   TRANSMETATARSAL AMPUTATION Left 11/23/2021   Procedure: TRANSMETATARSAL AMPUTATION;  Surgeon: Louann Sjogren, MD;  Location: [facility] OR;  Service: Podiatry;  Laterality: Left;   TUBAL LIGATION      Family History  Problem Relation Age of Onset   Hypertension Mother    Hyperlipidemia Mother    Heart attack Father    Heart disease Father    Hypertension Father    Alzheimer's disease Father    Hypertension Brother    Hyperlipidemia Brother    Social History   Socioeconomic History   Marital status: Married    Spouse name: Not on file   Number of children: Not on file   Years of education:  Not on file   Highest education level: Not on file  Occupational History   Not on file  Tobacco Use   Smoking status: Former    Current packs/day: 0.00    Types: Cigarettes    Quit date: 05/2016    Years since quitting: 7.8   Smokeless tobacco: Never  Vaping Use   Vaping status: Never Used  Substance and Sexual Activity   Alcohol use: Yes    Comment: rare   Drug use: No   Sexual activity: Yes    Birth control/protection: Surgical    Comment: Hysterectomy  Other Topics Concern   Not on file  Social History Narrative   Not on file   Social Drivers of Health   Financial Resource Strain: Low Risk  (10/16/2023)   Overall Financial Resource Strain (CARDIA)    Difficulty of Paying Living Expenses: Not hard at all  Food Insecurity: No Food Insecurity (10/16/2023)   Hunger Vital Sign    Worried About Running Out of Food in the Last Year: Never true    Ran Out of Food in the Last Year: Never true  Transportation Needs: No Transportation Needs (10/16/2023)   PRAPARE - Administrator, Civil Service (Medical): No    Lack of Transportation (Non-Medical): No  Physical Activity: Inactive (10/16/2023)   Exercise Vital Sign    Days of Exercise per Week: 0 days    Minutes of Exercise per Session: 0 min  Stress: No Stress Concern Present (10/16/2023)   [employer] of Occupational Health - Occupational Stress Questionnaire    Feeling of Stress : Not at all  Social Connections: Socially Integrated (10/16/2023)   Social Connection and Isolation Panel [NHANES]    Frequency of Communication with Friends and Family: More than three times a week    Frequency of Social Gatherings with Friends and Family: More than three times a week    Attends Religious Services: More than 4 times  per year    Active Member of Clubs or Organizations: Yes    Attends Banker Meetings: More than 4 times per year    Marital Status: Married   CONSTITUTIONAL: Negative for chills,  fatigue, fever, unintentional weight gain and unintentional weight loss.  E/N/T: Negative for ear pain, nasal congestion and sore throat.  CARDIOVASCULAR: Negative for chest pain, dizziness, palpitations and pedal edema.  RESPIRATORY: Negative for recent cough and dyspnea.  GASTROINTESTINAL: Negative for abdominal pain, acid reflux symptoms, constipation, diarrhea, nausea and vomiting.  MSK: Negative for arthralgias and myalgias.  INTEGUMENTARY: Negative for rash.  NEUROLOGICAL: Negative for dizziness and headaches.  PSYCHIATRIC: Negative for sleep disturbance and to question depression screen.  Negative for depression, negative for anhedonia.      Objective:  PHYSICAL EXAM:   VS: BP 110/80   Pulse 62   Temp 98.2 F (36.8 C) (Temporal)   Resp 18   Ht 5\' 7"  (1.702 m)   Wt 224 lb 6.4 oz (101.8 kg)   LMP  (LMP Unknown)   SpO2 98%   BMI 35.15 kg/m   GEN: Well nourished, well developed, in no acute distress   Cardiac: RRR; no murmurs, rubs, or gallops,no edema - Respiratory:  normal respiratory rate and pattern with no distress - normal breath sounds with no rales, rhonchi, wheezes or rubs GI: normal bowel sounds, no masses or tenderness MS: no deformity or atrophy  Skin: warm and dry, no rash  Neuro:  Alert and Oriented x 3,  - CN II-Xii grossly intact Psych: euthymic mood, appropriate affect and demeanor  No visits with results within 1 Day(s) from this visit.  Latest known visit with results is:  Office Visit on 02/18/2024  Component Date Value Ref Range Status   Glucose 02/18/2024 263 (H)  70 - 99 mg/dL Final   BUN 36/64/4034 19  8 - 27 mg/dL Final   Creatinine, Ser 02/18/2024 0.95  0.57 - 1.00 mg/dL Final   eGFR 74/25/9563 67  >59 mL/min/1.73 Final   BUN/Creatinine Ratio 02/18/2024 20  12 - 28 Final   Sodium 02/18/2024 138  134 - 144 mmol/L Final   Potassium 02/18/2024 5.7 (H)  3.5 - 5.2 mmol/L Final   Chloride 02/18/2024 102  96 - 106 mmol/L Final   CO2 02/18/2024 24   20 - 29 mmol/L Final   Calcium 02/18/2024 10.3  8.7 - 10.3 mg/dL Final   Total Protein 87/56/4332 7.1  6.0 - 8.5 g/dL Final   Albumin 95/18/8416 4.0  3.9 - 4.9 g/dL Final   Globulin, Total 02/18/2024 3.1  1.5 - 4.5 g/dL Final   Bilirubin Total 02/18/2024 0.3  0.0 - 1.2 mg/dL Final   Alkaline Phosphatase 02/18/2024 78  44 - 121 IU/L Final   AST 02/18/2024 14  0 - 40 IU/L Final   ALT 02/18/2024 15  0 - 32 IU/L Final   Hgb A1c MFr Bld 02/18/2024 9.4 (H)  4.8 - 5.6 % Final   Comment:          Prediabetes: 5.7 - 6.4          Diabetes: >6.4          Glycemic control for adults with diabetes: <7.0    Est. average glucose Bld gHb Est-m* 02/18/2024 223  mg/dL Final        Lab Results  Component Value Date   WBC 8.5 12/05/2023   HGB 13.2 12/05/2023   HCT 41.0 12/05/2023   PLT  254 12/05/2023   GLUCOSE 263 (H) 02/18/2024   CHOL 216 (H) 08/09/2023   TRIG 248 (H) 08/09/2023   HDL 66 08/09/2023   LDLDIRECT 91.4 05/22/2020   LDLCALC 108 (H) 08/09/2023   ALT 15 02/18/2024   AST 14 02/18/2024   NA 138 02/18/2024   K 5.7 (H) 02/18/2024   CL 102 02/18/2024   CREATININE 0.95 02/18/2024   BUN 19 02/18/2024   CO2 24 02/18/2024   TSH 1.850 08/09/2023   INR 1.1 07/20/2021   HGBA1C 9.4 (H) 02/18/2024   MICROALBUR 293.5 (H) 07/21/2021      Assessment & Plan:   Problem List Items Addressed This Visit       Cardiovascular and Mediastinum   Chronic diastolic heart failure (HCC)   Relevant Medications   nitroGLYCERIN (NITROSTAT) 0.4 MG SL tablet Continue meds Follow up with cardiology   Essential hypertension - Primary   Relevant Medications   nitroGLYCERIN (NITROSTAT) 0.4 MG SL tablet Continue all meds Follow with cardiology   Other Relevant Orders   CBC with Differential/Platelet   Comprehensive metabolic panel   TSH   Peripheral vascular disease (HCC)   Relevant Medications   nitroGLYCERIN (NITROSTAT) 0.4 MG SL tablet     Digestive   GERD (gastroesophageal reflux  disease)   Relevant Medications   omeprazole (PRILOSEC) 20 MG capsule     Endocrine   Diabetic foot ulcer (HCC)   Relevant Medications   insulin degludec (TRESIBA FLEXTOUCH) 100 UNIT/ML FlexTouch Pen Continue follow up with podiatry   Diabetes mellitus due to underlying condition with hyperglycemia, with long-term current use of insulin (HCC)   Relevant Medications   insulin degludec (TRESIBA FLEXTOUCH) 100 UNIT/ML FlexTouch Pen   Other Relevant Orders   CBC with Differential/Platelet   Comprehensive metabolic panel   TSH   Hemoglobin A1c   DISCUSSED IN DETAIL ABOUT DOSING SLIDING SCALE HUMALOG - ADJUST DOSE AS DESCRIBED IN HPI - KEEP RECORD OF GLUCOSE READINGS  Increase insulin to 62 units qd     Musculoskeletal and Integument              Other   Hyperlipidemia   Relevant Medications   nitroGLYCERIN (NITROSTAT) 0.4 MG SL tablet   Other Relevant Orders   Lipid panel Continue meds Restart Repatha   ICD (implantable cardioverter-defibrillator) in place   Relevant Medications   nitroGLYCERIN (NITROSTAT) 0.4 MG SL tablet Follow with cardiology   Anxiety Continue lorazepam   Partial nontraumatic amputation of left foot (HCC)   Vitamin D deficiency   Relevant Orders   VITAMIN D 25 Hydroxy (Vit-D Deficiency, Fractures)   Amputation of right great toe (HCC)                                      .  Meds ordered this encounter  Medications   Evolocumab (REPATHA SURECLICK) 140 MG/ML SOAJ    Sig: Inject 140 mg into the skin every 14 (fourteen) days.    Dispense:  2 mL    Refill:  1    Supervising Provider:   COX, KIRSTEN [id number]   insulin glargine, 2 Unit Dial, (TOUJEO MAX SOLOSTAR) 300 UNIT/ML Solostar Pen    Sig: Inject 62 Units into the skin daily.    Supervising Provider:   Blane Ohara [phone]    Orders Placed This Encounter  Procedures   Microalbumin / creatinine urine ratio   CBC  with Differential/Platelet   Comprehensive metabolic panel with GFR    TSH   Lipid panel   B12 and Folate Panel   VITAMIN D 25 Hydroxy (Vit-D Deficiency, Fractures)     Follow-up: Return in about 3 months (around 06/13/2024) for chronic fasting follow-up.  An After Visit Summary was printed and given to the patient.  Jettie Pagan [facility] [phone]

## 2024-03-14 LAB — LIPID PANEL
Chol/HDL Ratio: 4.1 ratio (ref 0.0–4.4)
Cholesterol, Total: 242 mg/dL — ABNORMAL HIGH (ref 100–199)
HDL: 59 mg/dL (ref >39)
LDL Chol Calc (NIH): 138 mg/dL — ABNORMAL HIGH (ref 0–99)
Triglycerides: 251 mg/dL — ABNORMAL HIGH (ref 0–149)
VLDL Cholesterol Cal: 45 mg/dL — ABNORMAL HIGH (ref 5–40)

## 2024-03-14 LAB — CBC WITH DIFFERENTIAL/PLATELET
Basophils Absolute: 0.1 10*3/uL (ref 0.0–0.2)
Basos: 1 %
EOS (ABSOLUTE): 0.2 10*3/uL (ref 0.0–0.4)
Eos: 2 %
Hematocrit: 39.1 % (ref 34.0–46.6)
Hemoglobin: 13.1 g/dL (ref 11.1–15.9)
Immature Grans (Abs): 0.1 10*3/uL (ref 0.0–0.1)
Immature Granulocytes: 1 %
Lymphocytes Absolute: 1.5 10*3/uL (ref 0.7–3.1)
Lymphs: 18 %
MCH: 29.8 pg (ref 26.6–33.0)
MCHC: 33.5 g/dL (ref 31.5–35.7)
MCV: 89 fL (ref 79–97)
Monocytes Absolute: 0.6 10*3/uL (ref 0.1–0.9)
Monocytes: 6 %
Neutrophils Absolute: 6.2 10*3/uL (ref 1.4–7.0)
Neutrophils: 72 %
Platelets: 291 10*3/uL (ref 150–450)
RBC: 4.4 x10E6/uL (ref 3.77–5.28)
RDW: 13.4 % (ref 11.7–15.4)
WBC: 8.6 10*3/uL (ref 3.4–10.8)

## 2024-03-14 LAB — COMPREHENSIVE METABOLIC PANEL WITH GFR
ALT: 14 IU/L (ref 0–32)
AST: 17 IU/L (ref 0–40)
Albumin: 3.9 g/dL (ref 3.9–4.9)
Alkaline Phosphatase: 72 IU/L (ref 44–121)
BUN/Creatinine Ratio: 18 (ref 12–28)
BUN: 17 mg/dL (ref 8–27)
Bilirubin Total: 0.2 mg/dL (ref 0.0–1.2)
CO2: 19 mmol/L — ABNORMAL LOW (ref 20–29)
Calcium: 9.5 mg/dL (ref 8.7–10.3)
Chloride: 105 mmol/L (ref 96–106)
Creatinine, Ser: 0.96 mg/dL (ref 0.57–1.00)
Globulin, Total: 2.9 g/dL (ref 1.5–4.5)
Glucose: 206 mg/dL — ABNORMAL HIGH (ref 70–99)
Potassium: 4.7 mmol/L (ref 3.5–5.2)
Sodium: 140 mmol/L (ref 134–144)
Total Protein: 6.8 g/dL (ref 6.0–8.5)
eGFR: 66 mL/min/{1.73_m2} (ref >59)

## 2024-03-14 LAB — TSH: TSH: 3.63 u[IU]/mL (ref 0.450–4.500)

## 2024-03-14 LAB — VITAMIN D 25 HYDROXY (VIT D DEFICIENCY, FRACTURES): Vit D, 25-Hydroxy: 26.9 ng/mL — ABNORMAL LOW (ref 30.0–100.0)

## 2024-03-14 LAB — B12 AND FOLATE PANEL
Folate: 9 ng/mL (ref >3.0)
Vitamin B-12: 607 pg/mL (ref 232–1245)

## 2024-03-14 LAB — MICROALBUMIN / CREATININE URINE RATIO
Creatinine, Urine: 45.9 mg/dL
Microalb/Creat Ratio: 3443 mg/g{creat} — ABNORMAL HIGH (ref 0–29)
Microalbumin, Urine: 1580.2 ug/mL

## 2024-03-16 ENCOUNTER — Other Ambulatory Visit: Payer: Self-pay

## 2024-03-16 DIAGNOSIS — E1165 Type 2 diabetes mellitus with hyperglycemia: Secondary | ICD-10-CM

## 2024-03-16 NOTE — Addendum Note (Signed)
 Addended by: Elease Etienne A on: 03/16/2024 01:31 PM   Modules accepted: Orders

## 2024-03-16 NOTE — Progress Notes (Signed)
 Remote ICD transmission.

## 2024-03-17 ENCOUNTER — Encounter: Payer: Self-pay | Admitting: Physician Assistant

## 2024-03-18 ENCOUNTER — Other Ambulatory Visit: Payer: Self-pay | Admitting: Cardiology

## 2024-03-19 ENCOUNTER — Telehealth: Payer: Self-pay | Admitting: Cardiology

## 2024-03-19 NOTE — Telephone Encounter (Signed)
*  STAT* If patient is at the pharmacy, call can be transferred to refill team.   1. Which medications need to be refilled? (please list name of each medication and dose if known) amiodarone (PACERONE) 200 MG tablet    2. Would you like to learn more about the convenience, safety, & potential cost savings by using the [facility]? No   3. Are you open to using the [facility] (Type [facility].  ). No   4. Which pharmacy/location (including street and city if local pharmacy) is medication to be sent to? [facility] - [place], [place] - [address]    5. Do they need a 30 day or 90 day supply? 90 day

## 2024-03-25 ENCOUNTER — Ambulatory Visit (INDEPENDENT_AMBULATORY_CARE_PROVIDER_SITE_OTHER): Admitting: Podiatry

## 2024-03-25 ENCOUNTER — Encounter: Payer: Self-pay | Admitting: Podiatry

## 2024-03-25 DIAGNOSIS — M21172 Varus deformity, not elsewhere classified, left ankle: Secondary | ICD-10-CM | POA: Diagnosis not present

## 2024-03-25 NOTE — Progress Notes (Signed)
 Chief Complaint  Patient presents with   Routine Post Op    RM#8 POV #4 DOS 12/06/2023 TIBIALIS ANTEROR TENDON TRANSFER LEFT-Patient has concern with the left foot area on the bottom of foot.    Subjective:  Patient presents today status post tibialis anterior tendon transfer left lower extremity.  DOS: 12/06/2023.  Patient continues to do very well.  She is weightbearing as tolerated and ambulating in diabetic shoes.  No new complaints  Past Medical History:  Diagnosis Date   Abnormal EKG 10/23/2021   Abnormal mammogram of left breast 09/04/2021   Abnormal stress test 10/23/2021   Acquired varus deformity of foot, left    Acute chest pain 05/08/2016   AICD (automatic cardioverter/defibrillator) present    St Jude/Abbott device   AKI (acute kidney injury) (HCC) 10/23/2021   Angina pectoris (HCC) 09/11/2017   Anxiety 10/23/2021   Bacteremia 11/22/2021   Breast wound, right, subsequent encounter 09/04/2021   Burping 05/08/2016   Cancer (HCC)    cervical - hysterectomy   Cellulitis of right breast 10/23/2021   Cellulitis, wound, post-operative 10/09/2021   Chronic diastolic heart failure (HCC) 11/06/2016   Coronary artery disease involving native coronary artery of native heart with angina pectoris (HCC) 09/12/2015   Overview:  PCI and stent of RCA 2009, last cath 2012 with medical therapy  CABG May 2017   Demand ischemia [facility])    Diabetic foot infection (HCC) 11/21/2021   Diabetic foot ulcer (HCC) 02/16/2020   Emphysema lung (HCC) 09/11/2017   patient not aware of this dx   Essential hypertension 09/12/2015   Gangrene (HCC) 10/23/2021   GERD (gastroesophageal reflux disease) 05/08/2016   Hematoma 10/23/2021   Hiatal hernia 10/23/2021   History of methicillin resistant staphylococcus aureus (MRSA)    Hyperglycemia 10/23/2021   Hyperglycemia due to type 2 diabetes mellitus (HCC) 10/23/2021   Hyperkalemia    Hyperlipidemia 09/12/2015   Hyponatremia 10/23/2021   ICD  (implantable cardioverter-defibrillator) in place 05/16/2021   Infection of great toe 10/23/2021   Lactic acidosis 10/23/2021   Leukocytosis 10/23/2021   Mediastinitis 06/28/2016   Morbid obesity (HCC) 05/08/2016   Myocardial infarction [facility])    NSTEMI (non-ST elevated myocardial infarction) (HCC) 05/22/2020   Obesity (BMI 30-39.9) 10/23/2021   Perineal abscess 10/23/2021   Peripheral vascular disease (HCC)    S/P CABG (coronary artery bypass graft) 06/03/2016   Overview:  The patient underwent sternal reconstruction on 06/21/16 with pec flaps for mediastinitis from a prior CABG in May 2017. On admission, she was critically ill from sepsis and had altered mental status. She was last seen in clinic on 08/09/16 at which time she was doing well.   Severe sepsis (HCC) 06/03/2016   Sinus tachycardia 05/08/2016   Tobacco use disorder 04/20/2016   Overview:  Quit in May 2017.   Type 2 diabetes mellitus with foot ulcer (HCC) 05/08/2016   Unstable angina (HCC) 05/22/2020   Ventricular tachycardia (HCC) 12/17/2020   Wound, surgical, infected 06/06/2016   Overview:  sternal    Past Surgical History:  Procedure Laterality Date   ABDOMINAL AORTOGRAM W/LOWER EXTREMITY N/A 10/11/2021   Procedure: ABDOMINAL AORTOGRAM W/LOWER EXTREMITY;  Surgeon: Cephus Shelling, MD;  [facility] IN[facility];  Service: Cardiovascular;  Laterality: N/A;   AMPUTATION TOE Right 02/20/2020   Procedure: AMPUTATION RIGHT GREAT  TOE;  Surgeon: Candelaria Stagers, DPM;  Location: [facility] OR;  Service: Podiatry;  Laterality: Right;   BLADDER SURGERY     BONE  BIOPSY Left 02/01/2022   Procedure: BONE BIOPSY LEFT FOOT, IRRIGATION AND DEBRIDEMENT;  Surgeon: Asencion Islam, DPM;  Location: [facility] OR;  Service: Podiatry;  Laterality: Left;   BONE BIOPSY Left 10/31/2023   Procedure: BONE BIOPSY;  Surgeon: Felecia Shelling, DPM;  Location: [facility] ORS;  Service: Orthopedics/Podiatry;  Laterality: Left;   BUBBLE STUDY  11/27/2021   Procedure:  BUBBLE STUDY;  Surgeon: Pricilla Riffle, MD;  Location: [facility] ENDOSCOPY;  Service: Cardiovascular;;   CARDIAC CATHETERIZATION     CORONARY ARTERY BYPASS GRAFT     CORONARY STENT INTERVENTION N/A 05/25/2020   Procedure: CORONARY STENT INTERVENTION;  Surgeon: Iran Ouch, MD;  [facility];  Service: Cardiovascular;  Laterality: N/A;   CORONARY STENT INTERVENTION N/A 06/22/2020   Procedure: CORONARY STENT INTERVENTION;  Surgeon: Marykay Lex, MD;  Location: [facility] INVASIVE CV LAB;  Service: Cardiovascular;  Laterality: N/A;   CORONARY ULTRASOUND/IVUS N/A 06/22/2020   Procedure: Intravascular Ultrasound/IVUS;  Surgeon: Marykay Lex, MD;  Location: [facility] INVASIVE CV LAB;  Service: Cardiovascular;  Laterality: N/A;   ICD IMPLANT N/A 12/19/2020   Procedure: ICD IMPLANT;  Surgeon: Marinus Maw, MD;  Location: [facility] INVASIVE CV LAB;  Service: Cardiovascular;  Laterality: N/A;   INCISION AND DRAINAGE Right 02/19/2020   Procedure: INCISION AND DRAINAGE;  Surgeon: Vivi Barrack, DPM;  Location: [facility] OR;  Service: Podiatry;  Laterality: Right;  Block done by surgeon   INCISION AND DRAINAGE OF WOUND Left 10/31/2023   Procedure: IRRIGATION AND DEBRIDEMENT WOUND;  Surgeon: Felecia Shelling, DPM;  Location: [facility] ORS;  Service: Orthopedics/Podiatry;  Laterality: Left;   IRRIGATION AND DEBRIDEMENT FOOT Left 10/10/2021   Procedure: IRRIGATION AND DEBRIDEMENT FOOT;  Surgeon: Asencion Islam, DPM;  [facility];  Service: Podiatry;  Laterality: Left;   IRRIGATION AND DEBRIDEMENT FOOT Left 11/23/2021   Procedure: IRRIGATION AND DEBRIDEMENT FOOT;  Surgeon: Louann Sjogren, MD;  [facility];  Service: Podiatry;  Laterality: Left;  will do local block   LEFT HEART CATH AND CORS/GRAFTS ANGIOGRAPHY N/A 05/23/2020   Procedure: LEFT HEART CATH AND CORS/GRAFTS ANGIOGRAPHY;  Surgeon: Yvonne Kendall, MD;  [facility];  Service: Cardiovascular;  Laterality: N/A;   LEFT HEART CATH AND CORS/GRAFTS  ANGIOGRAPHY N/A 12/19/2020   Procedure: LEFT HEART CATH AND CORS/GRAFTS ANGIOGRAPHY;  Surgeon: Kathleene Hazel, MD;  [facility];  Service: Cardiovascular;  Laterality: N/A;   LEFT HEART CATH AND CORS/GRAFTS ANGIOGRAPHY N/A 07/21/2021   Procedure: LEFT HEART CATH AND CORS/GRAFTS ANGIOGRAPHY;  Surgeon: Yvonne Kendall, MD;  [facility];  Service: Cardiovascular;  Laterality: N/A;   METATARSAL HEAD EXCISION Right 05/02/2020   Procedure: FIRST METATARSAL HEAD RESECTION; RIGHT FOOT WOUND CLOSURE;  Surgeon: Asencion Islam, DPM;  Location: [facility];  Service: Podiatry;  Laterality: Right;  MAC W/LOCAL   PERIPHERAL VASCULAR BALLOON ANGIOPLASTY Left 10/11/2021   Procedure: PERIPHERAL VASCULAR BALLOON ANGIOPLASTY;  Surgeon: Cephus Shelling, MD;  [facility];  Service: Cardiovascular;  Laterality: Left;  Anterior Tibial Artery   TEE WITHOUT CARDIOVERSION N/A 11/27/2021   Procedure: TRANSESOPHAGEAL ECHOCARDIOGRAM (TEE);  Surgeon: Pricilla Riffle, MD;  Location: [facility] ENDOSCOPY;  Service: Cardiovascular;  Laterality: N/A;   TIBIALIS TENDON TRANSFER / REPAIR Left 12/06/2023   Procedure: TIBIALIS TENDON TRANSFER;  Surgeon: Felecia Shelling, DPM;  Location: [facility] ORS;  Service: Orthopedics/Podiatry;  Laterality: Left;   TRANSMETATARSAL AMPUTATION Left 11/23/2021   Procedure: TRANSMETATARSAL  AMPUTATION;  Surgeon: Louann Sjogren, MD;  Location: [facility] OR;  Service: Podiatry;  Laterality: Left;   TUBAL LIGATION      Allergies  Allergen Reactions   Vancomycin Anaphylaxis   Chlorhexidine Gluconate Itching    Received CHG bath, began itching, required benadryl   Cat Dander Other (See Comments)    Sneezing, watery eyes.   Other Itching    Dial Soap   Tramadol Other (See Comments)    Hallucinations   Codeine Rash    LT foot 12/23/2023  Objective/Physical Exam Neurovascular status intact.  Foot continues to be in much more rectus alignment and  correction of the varus deformity noted. Radiographic Exam 01/20/2024:  Prior history of transmetatarsal amputation.  Metallic button anchor noted to the plantar aspect of the foot.  Radiolucency to the midportion of the third cuneiform at the location of the tendon transfer.  No gas within the tissue.  Staples intact  Assessment: 1. s/p tibialis anterior tendon transfer left. DOS: 12/06/2023 2.  History of transmetatarsal amputation left   Plan of Care:  -Patient was evaluated. - Patient has an appointment with our diabetic shoe department for new diabetic shoes with custom molded Plastizote insoles -Patient may essentially return to full activity with no restrictions -Return to clinic after wearing her diabetic shoes for a few months to ensure appropriate break-in and that there is no irritation from her new shoes -Currently no scheduled follow-ups on file  Felecia Shelling, DPM [facility]  Dr. Felecia Shelling, DPM    [address] [place], [place] [address]                Office [phone]  Fax [phone]

## 2024-03-26 ENCOUNTER — Telehealth: Payer: Self-pay

## 2024-03-26 NOTE — Telephone Encounter (Signed)
 Printed coupon for patient to pick up, patient aware.  Copied from CRM [id number]. Topic: Clinical - Prescription Issue >> Mar 26, 2024  9:11 AM Elle L wrote: Reason for CRM: The patient is also requesting to see if the office has coupons or samples for Evolocumab (REPATHA SURECLICK) 140 MG/ML SOAJ as she found one online and she is unable to print it and they will only take a printed version at the Pharmacy. Her call back number is  [phone].

## 2024-03-30 ENCOUNTER — Other Ambulatory Visit: Payer: Self-pay | Admitting: Physician Assistant

## 2024-03-30 DIAGNOSIS — E1165 Type 2 diabetes mellitus with hyperglycemia: Secondary | ICD-10-CM

## 2024-03-30 NOTE — Telephone Encounter (Signed)
 Copied from CRM [id number]. Topic: Clinical - Medication Refill >> Mar 30, 2024  3:19 PM Carlatta H wrote: Most Recent Primary Care Visit:  Provider: Cyndi Drain  Department: [facility]  Visit Type: OFFICE VISIT  Date: 03/13/2024  Medication: insulin glargine, 2 Unit Dial, (TOUJEO MAX SOLOSTAR) 300 UNIT/ML Solostar Pen [[id number]]  Has the patient contacted their pharmacy? No (Agent: If no, request that the patient contact the pharmacy for the refill. If patient does not wish to contact the pharmacy document the reason why and proceed with request.) (Agent: If yes, when and what did the pharmacy advise?)  Is this the correct pharmacy for this prescription? Yes If no, delete pharmacy and type the correct one.  This is the patient's preferred pharmacy:    [facility] [address] [place] - [address] [place] [address] Phone: [phone] Fax: [phone]   Has the prescription been filled recently? No  Is the patient out of the medication? Yes  Has the patient been seen for an appointment in the last year OR does the patient have an upcoming appointment? Yes  Can we respond through MyChart? No  Agent: Please be advised that Rx refills may take up to 3 business days. We ask that you follow-up with your pharmacy.

## 2024-03-31 ENCOUNTER — Ambulatory Visit

## 2024-03-31 DIAGNOSIS — Z89432 Acquired absence of left foot: Secondary | ICD-10-CM

## 2024-03-31 NOTE — Progress Notes (Signed)
 Patient was present and impressions taken for BIL toefillers  Patient has [employer] [place] called and spoke with [place] W. No auth required for L5000, A5500 or [phone]  Patient only wants to proceed with L5000 x2ea however she wants to call [employer] and see if there is any Ded or Coins  She will call me back and let me know  Britton Cane CPed, CFo, CFm

## 2024-04-05 ENCOUNTER — Other Ambulatory Visit: Payer: Self-pay | Admitting: Physician Assistant

## 2024-04-05 DIAGNOSIS — Z794 Long term (current) use of insulin: Secondary | ICD-10-CM

## 2024-04-09 ENCOUNTER — Telehealth: Payer: Self-pay

## 2024-04-09 NOTE — Telephone Encounter (Signed)
 Patient inquired about starting Ozempic per diabetes educator.   Per Dwain Giovanni: yes she can start ozempic ---- the only thing is patient is going to have to record the sliding scale insulin  she is doing because she had been doing it all wrong and dosing wrong (hopefully can get to a point where she does not have to use this)   I relayed this to [facility] and she is going to call patient to schedule a time she can meet with her to educate and start Ozempic.

## 2024-04-10 ENCOUNTER — Other Ambulatory Visit: Payer: Self-pay

## 2024-04-10 DIAGNOSIS — Z79899 Other long term (current) drug therapy: Secondary | ICD-10-CM

## 2024-04-13 ENCOUNTER — Other Ambulatory Visit: Payer: Self-pay | Admitting: Cardiology

## 2024-04-15 ENCOUNTER — Ambulatory Visit

## 2024-04-15 ENCOUNTER — Ambulatory Visit (INDEPENDENT_AMBULATORY_CARE_PROVIDER_SITE_OTHER)

## 2024-04-15 DIAGNOSIS — E1165 Type 2 diabetes mellitus with hyperglycemia: Secondary | ICD-10-CM

## 2024-04-15 NOTE — Progress Notes (Signed)
 04/16/2024 Name: Jasmine Richardson MRN: [id number] DOB: Oct 31, 1961  Chief Complaint  Patient presents with   Medication Management   Jasmine Richardson is a 63 y.o. year old female who was referred for medication management by their primary care provider, Cyndi Drain, PA-C. They presented for a face to face visit today.   They were referred to the pharmacist by their PCP for assistance in managing diabetes    Subjective:  Care Team: Primary Care Provider: Paullette Boston ; Next Scheduled Visit: Future Appointments  Date Time Provider Department Center  04/29/2024 11:00 AM [facility] None  05/15/2024 10:05 AM [facility] DEVICE REMOTES [facility]  06/10/2024  4:20 PM Hassan Links, MD [facility] None  07/07/2024 10:00 AM Cyndi Drain, PA-C [facility] None  08/14/2024 10:05 AM [facility] DEVICE REMOTES [facility]  11/09/2024 10:05 AM [facility] DEVICE REMOTES [facility]  02/08/2025 10:05 AM [facility] DEVICE REMOTES [facility]  05/11/2025 10:05 AM [facility] DEVICE REMOTES [facility]    Medication Access/Adherence  Current Pharmacy:  [facility] [address] [place] [address] Phone: [phone] Fax: [phone]  [facility] [address] [place] - [address] [place] [address] Phone: [phone] Fax: [phone]   Patient reports affordability concerns with their medications: No  Patient reports access/transportation concerns to their pharmacy: No  Patient reports adherence concerns with their medications:  No     Diabetes:  Current medications:  Metformin  500 mg twice daily  Toujeo  59 units every day Farxiga  10mg  Humalog  SS with meals  Metformin  XR 500 mg twice daily  do ozempic on Sundays upcoming   Current glucose readings: avg glucose 253 past two weeks Using CGM meter; testing continuously  Date of Download: 04/15/2024 % Time CGM is active: 83% Average Glucose: 253 mg/dL Glucose  Management Indicator:   Glucose Variability: 19 (goal <36%) Time in Goal:  - Time in range 70-180: 13% - Time above range: 94 (47% and 47%)% - Time below range: 0% Observed patterns:  Patient denies hypoglycemic s/sx including dizziness, shakiness, sweating. Patient denies hyperglycemic symptoms including, polyuria, polydipsia, polyphagia, nocturia, neuropathy, blurred vision.  Current meal patterns: high carb, processed foods  Current physical activity:   Current medication access support: just got enrolled in C-SNP - HTA DM/Heart   Objective:  Lab Results  Component Value Date   HGBA1C 9.4 (H) 02/18/2024    Lab Results  Component Value Date   CREATININE 0.96 03/13/2024   BUN 17 03/13/2024   NA 140 03/13/2024   K 4.7 03/13/2024   CL 105 03/13/2024   CO2 19 (L) 03/13/2024    Lab Results  Component Value Date   CHOL 242 (H) 03/13/2024   HDL 59 03/13/2024   LDLCALC 138 (H) 03/13/2024   LDLDIRECT 91.4 05/22/2020   TRIG 251 (H) 03/13/2024   CHOLHDL 4.1 03/13/2024    Medications Reviewed Today     Reviewed by Rolando Cliche, [facility] (Pharmacist) on 04/16/24 at 1035  Med List Status: <None>   Medication Order Taking? Sig Documenting Provider Last Dose Status Informant  acetaminophen  (TYLENOL ) 325 MG tablet [id number] No Take 2 tablets (650 mg total) by mouth every 4 (four) hours as needed for headache or mild pain. Samtani, Jai-Gurmukh, MD Taking Active Self  amiodarone  (PACERONE ) 200 MG tablet [id number]  Take 1 tablet (200 mg total) by mouth daily. Patient must keep appointment on 06/10/24 for further refills. 3 rd/final  attempt Hassan Links, MD  Active   aspirin  81 MG chewable tablet [id number] No Chew 81 mg by mouth daily. [provider] Taking Active Self  clopidogrel  (PLAVIX ) 75 MG tablet [id number] No Take 1 tablet (75 mg total) by mouth daily. Hassan Links, MD Taking Active Self  Continuous Glucose Sensor (DEXCOM G7 SENSOR) MISC [id number] No 1 each by Does  not apply route as directed. Change sensor every 10 days Cyndi Drain, PA-C Taking Active   Evolocumab  (REPATHA  SURECLICK) 140 MG/ML SOAJ [id number] No Inject 140 mg into the skin every 14 (fourteen) days. Cyndi Drain, PA-C Taking Active   FARXIGA  10 MG TABS tablet [id number]  Take 1 tablet by mouth once daily Cyndi Drain, PA-C  Active   furosemide  (LASIX ) 20 MG tablet [id number] No Take 1 tablet (20 mg total) by mouth daily.  Patient taking differently: Take 20 mg by mouth every other day.   Hassan Links, MD Taking Active Self  HUMALOG  KWIKPEN 100 UNIT/ML KwikPen [id number] No Inject 10-50 Units into the skin at bedtime. Sliding scale Cyndi Drain, PA-C Taking Active Self           Med Note Lambert Pillion   Fri Dec 06, 2023 11:46 AM) Took 4 units last night, 12/06/23  LORazepam  (ATIVAN ) 1 MG tablet [id number] No Take 1 tablet (1 mg total) by mouth as needed for anxiety. Cyndi Drain, PA-C Taking Active Self  metFORMIN  (GLUCOPHAGE ) 500 MG tablet [id number] No TAKE 1 TABLET BY MOUTH TWICE DAILY WITH A MEAL Cyndi Drain, PA-C Taking Active   metoprolol  succinate (TOPROL -XL) 25 MG 24 hr tablet [id number] No Take 1 tablet (25 mg total) by mouth in the morning, at noon, and at bedtime. Hassan Links, MD Taking Active Self  mupirocin  ointment (BACTROBAN ) 2 % [id number] No Apply 1 Application topically 2 (two) times daily. Raspet, Betsey Brow, PA-C Taking Active   nitroGLYCERIN  (NITROSTAT ) 0.4 MG SL tablet [id number] No Place 1 tablet (0.4 mg total) under the tongue every 5 (five) minutes as needed for chest pain. Cyndi Drain, PA-C Taking Active Self  nystatin  (MYCOSTATIN /NYSTOP ) powder [id number] No Apply 1 Application topically daily as needed (rash/yeast). Cyndi Drain, PA-C Taking Active Self  Omega-3 Fatty Acids (FISH OIL) 500 MG CAPS [id number] No Take 500 mg by mouth at bedtime. [provider] Taking Active Self  omeprazole  (PRILOSEC) 20 MG capsule [id number] No Take 1 capsule (20 mg total) by mouth  daily.  Patient taking differently: Take 20 mg by mouth every morning.   Cyndi Drain, PA-C Taking Active Self  promethazine  (PHENERGAN ) 25 MG tablet [id number] No Take 25 mg by mouth every 6 (six) hours as needed for nausea or vomiting. [provider] Taking Active Self  rosuvastatin  (CRESTOR ) 20 MG tablet [id number] No Take 1 tablet (20 mg total) by mouth daily.  Patient taking differently: Take 20 mg by mouth at bedtime.   Hassan Links, MD Taking Active Self  senna-docusate (SENOKOT-S) 8.6-50 MG tablet [id number] No Take 1 tablet by mouth every other day. [provider] Taking Active Self  TOUJEO  MAX SOLOSTAR 300 UNIT/ML Solostar Pen [id number]  INJECT 59 UNITS SUBCUTANEOUSLY IN THE MORNING Cyndi Drain, [place]  Active   valsartan  (DIOVAN ) 80 MG tablet [id number] No Take 1 tablet (80 mg total) by mouth 2 (two) times daily. Hassan Links, MD Taking Active Self  Vitamin D , Ergocalciferol , (DRISDOL ) 1.25 MG (50000 UNIT) CAPS capsule [id number]  TAKE 1 CAPSULE BY MOUTH EVERY SATURDAY Davis,  Abraham Hoffmann, PA-C  Active            Assessment/Plan:   Diabetes: - Currently uncontrolled - Reviewed long term cardiovascular and renal outcomes of uncontrolled blood sugar - Reviewed goal A1c, goal fasting, and goal 2 hour post prandial glucose - Reviewed dietary modifications including cutting out juices, teas, crackers. - Reviewed lifestyle modifications including:  - Recommend to start ozempic, sample provided by PCP, pt anticipates starting this Sunday.  --reviewed proper timing of Humalog  prior to meals. Noted that patient had been taking anywhere from to 1+hr before a meal when taking.  --strongly encouraged consistently of medications and meals.  - Patient denies personal or family history of multiple endocrine neoplasia type 2, medullary thyroid cancer; personal history of pancreatitis or gallbladder disease. - Recommend to check glucose with CGM continuously  --counseled on  insurance benefits - was just changed to HTA DM/Heart C-[facility] plan   Follow Up Plan: 2wk OV scheduled  Rolando Cliche, PharmD, BCGP Clinical Pharmacist  [phone]

## 2024-04-29 ENCOUNTER — Other Ambulatory Visit (HOSPITAL_BASED_OUTPATIENT_CLINIC_OR_DEPARTMENT_OTHER): Payer: Self-pay

## 2024-04-29 ENCOUNTER — Ambulatory Visit (INDEPENDENT_AMBULATORY_CARE_PROVIDER_SITE_OTHER)

## 2024-04-29 DIAGNOSIS — Z794 Long term (current) use of insulin: Secondary | ICD-10-CM

## 2024-04-29 DIAGNOSIS — E1165 Type 2 diabetes mellitus with hyperglycemia: Secondary | ICD-10-CM

## 2024-04-29 MED ORDER — DEXCOM G7 SENSOR MISC
1.0000 | 12 refills | Status: DC
  Filled 2024-04-29: qty 3, 30d supply, fill #0
  Filled 2024-06-29: qty 3, 30d supply, fill #1
  Filled 2024-07-10: qty 3, 30d supply, fill #2
  Filled ????-??-??: fill #2

## 2024-04-29 NOTE — Progress Notes (Signed)
 04/29/2024 Name: Jasmine Richardson MRN: [id number] DOB: [birth year]  Chief Complaint  Patient presents with   Medication Management   Diabetes   Jasmine Richardson is a 63 y.o. year old female who was referred for medication management by their primary care provider, Cyndi Drain, PA-C. They presented for a face to face visit today. Did not remember to bring in medications today.    They were referred to the pharmacist by their PCP for assistance in managing diabetes    Subjective:  Care Team: Primary Care Provider: Paullette Boston ; Next Scheduled Visit: Future Appointments  Date Time Provider Department Center  05/12/2024 11:00 AM Rolando Cliche, [facility] None  05/15/2024 10:05 AM CVD HVT DEVICE REMOTES [facility] H&V  06/10/2024  4:20 PM Hassan Links, MD [facility] None  07/07/2024 10:00 AM Cyndi Drain, PA-C [facility] None  08/14/2024 10:05 AM CVD HVT DEVICE REMOTES [facility] H&V  11/09/2024 10:05 AM CVD HVT DEVICE REMOTES [facility] H&V  02/08/2025 10:05 AM CVD HVT DEVICE REMOTES [facility] H&V  05/11/2025 10:05 AM CVD HVT DEVICE REMOTES [facility] H&V    Medication Access/Adherence  Current Pharmacy:  Melodee Spruce [facility] - [facility] [address] [place] [address] Phone: [phone] Fax: [phone]  [facility] [address] [place] [address] [place] [address] Phone: [phone] Fax: [phone]  [facility] [address] [place] [address] Phone: [phone] Fax: [phone]   Patient reports affordability concerns with their medications: No  Patient reports access/transportation concerns to their pharmacy: No  Patient reports adherence concerns with their medications:  No     Diabetes:  Current medications:  Metformin  500 mg twice daily  Toujeo  63 units every day Farxiga  10mg  Humalog  SS with meals  Metformin  XR 500 mg twice daily  Will start  ozempic on Sundays upcoming 05/03/2024  Current glucose readings: avg glucose 253 past two weeks Using CGM meter; testing continuously  Date of Download: 04/15/2024 % Time CGM is active: 83% Average Glucose: 253 mg/dL Glucose Management Indicator:   Glucose Variability: 19 (goal <36%) Time in Goal:  - Time in range 70-180: 13% - Time above range: 94 (47% and 47%)% - Time below range: 0% Observed patterns:  04/29/2024 update - 3 day avg glucose 194, 7 day 214. Time in range 29%, no lows.    Patient denies hypoglycemic s/sx including dizziness, shakiness, sweating. Patient denies hyperglycemic symptoms including, polyuria, polydipsia, polyphagia, nocturia, neuropathy, blurred vision.  Current meal patterns: high carb, processed foods  Current physical activity:   Current medication access support: just got enrolled in C-SNP - HTA DM/Heart   Objective:  Lab Results  Component Value Date   HGBA1C 9.4 (H) 02/18/2024    Lab Results  Component Value Date   CREATININE 0.96 03/13/2024   BUN 17 03/13/2024   NA 140 03/13/2024   K 4.7 03/13/2024   CL 105 03/13/2024   CO2 19 (L) 03/13/2024    Lab Results  Component Value Date   CHOL 242 (H) 03/13/2024   HDL 59 03/13/2024   LDLCALC 138 (H) 03/13/2024   LDLDIRECT 91.4 05/22/2020   TRIG 251 (H) 03/13/2024   CHOLHDL 4.1 03/13/2024    Medications Reviewed Today     Reviewed by Rolando Cliche, [facility] (Pharmacist) on 04/29/24 at 1134  Med List Status: <None>   Medication Order Taking? Sig Documenting Provider Last Dose Status Informant  acetaminophen  (TYLENOL ) 325 MG tablet  [id number] No Take 2 tablets (650 mg total) by mouth every 4 (four) hours as needed for headache or mild pain. Samtani, Jai-Gurmukh, MD Taking Active Self  amiodarone  (PACERONE ) 200 MG tablet [id number]  Take 1 tablet (200 mg total) by mouth daily. Patient must keep appointment on 06/10/24 for further refills. 3 rd/final attempt Hassan Links, MD  Active    aspirin  81 MG chewable tablet [id number] No Chew 81 mg by mouth daily. [provider] Taking Active Self  clopidogrel  (PLAVIX ) 75 MG tablet [id number] No Take 1 tablet (75 mg total) by mouth daily. Hassan Links, MD Taking Active Self  Continuous Glucose Sensor (DEXCOM G7 SENSOR) MISC [id number] No 1 each by Does not apply route as directed. Change sensor every 10 days Cyndi Drain, PA-C Taking Active   Evolocumab  (REPATHA  SURECLICK) 140 MG/ML SOAJ [id number] No Inject 140 mg into the skin every 14 (fourteen) days. Cyndi Drain, PA-C Taking Active   FARXIGA  10 MG TABS tablet [id number]  Take 1 tablet by mouth once daily Cyndi Drain, PA-C  Active   furosemide  (LASIX ) 20 MG tablet [id number] No Take 1 tablet (20 mg total) by mouth daily.  Patient taking differently: Take 20 mg by mouth every other day.   Hassan Links, MD Taking Active Self  HUMALOG  KWIKPEN 100 UNIT/ML KwikPen [id number] No Inject 10-50 Units into the skin at bedtime. Sliding scale Cyndi Drain, PA-C Taking Active Self           Med Note Lambert Pillion   Fri Dec 06, 2023 11:46 AM) Budd Cargo 4 units last night, 12/06/23  LORazepam  (ATIVAN ) 1 MG tablet [id number] No Take 1 tablet (1 mg total) by mouth as needed for anxiety. Cyndi Drain, PA-C Taking Active Self  metFORMIN  (GLUCOPHAGE ) 500 MG tablet [id number] No TAKE 1 TABLET BY MOUTH TWICE DAILY WITH A MEAL Cyndi Drain, PA-C Taking Active   metoprolol  succinate (TOPROL -XL) 25 MG 24 hr tablet [id number] No Take 1 tablet (25 mg total) by mouth in the morning, at noon, and at bedtime. Hassan Links, MD Taking Active Self  mupirocin  ointment (BACTROBAN ) 2 % [id number] No Apply 1 Application topically 2 (two) times daily. Raspet, Betsey Brow, PA-C Taking Active   nitroGLYCERIN  (NITROSTAT ) 0.4 MG SL tablet [id number] No Place 1 tablet (0.4 mg total) under the tongue every 5 (five) minutes as needed for chest pain. Cyndi Drain, PA-C Taking Active Self  nystatin  (MYCOSTATIN /NYSTOP ) powder [id number]  No Apply 1 Application topically daily as needed (rash/yeast). Cyndi Drain, PA-C Taking Active Self  Omega-3 Fatty Acids (FISH OIL) 500 MG CAPS [id number] No Take 500 mg by mouth at bedtime. [provider] Taking Active Self  omeprazole  (PRILOSEC) 20 MG capsule [id number] No Take 1 capsule (20 mg total) by mouth daily.  Patient taking differently: Take 20 mg by mouth every morning.   Cyndi Drain, PA-C Taking Active Self  promethazine  (PHENERGAN ) 25 MG tablet [id number] No Take 25 mg by mouth every 6 (six) hours as needed for nausea or vomiting. [provider] Taking Active Self  rosuvastatin  (CRESTOR ) 20 MG tablet [id number] No Take 1 tablet (20 mg total) by mouth daily.  Patient taking differently: Take 20 mg by mouth at bedtime.   Hassan Links, MD Taking Active Self  senna-docusate (SENOKOT-S) 8.6-50 MG tablet [id number] No Take 1 tablet by mouth every other day. [provider] Taking Active Self  TOUJEO  MAX SOLOSTAR 300 UNIT/ML Solostar Pen [id number]  INJECT 59 UNITS SUBCUTANEOUSLY IN THE  MORNING Cyndi Drain, PA-C  Active   valsartan  (DIOVAN ) 80 MG tablet [id number] No Take 1 tablet (80 mg total) by mouth 2 (two) times daily. Hassan Links, MD Taking Active Self  Vitamin D , Ergocalciferol , (DRISDOL ) 1.25 MG (50000 UNIT) CAPS capsule [id number]  TAKE 1 CAPSULE BY MOUTH EVERY SATURDAY Cyndi Drain, PA-C  Active            Assessment/Plan:   Diabetes: - Currently uncontrolled - Reviewed long term cardiovascular and renal outcomes of uncontrolled blood sugar - Reviewed goal A1c, goal fasting, and goal 2 hour post prandial glucose - Reviewed dietary modifications including cutting out juices, teas, crackers. - Reviewed lifestyle modifications including:  - Recommend to start ozempic, sample provided by PCP, pt anticipates starting this Sunday.  --reviewed proper timing of Humalog  prior to meals. Noted that patient had been taking anywhere from to 1+hr before  a meal when taking.  --strongly encouraged consistently of medications and meals.  - Patient denies personal or family history of multiple endocrine neoplasia type 2, medullary thyroid cancer; personal history of pancreatitis or gallbladder disease. - Recommend to check glucose with CGM continuously  --counseled on insurance benefits - was just changed to [facility] DM/Heart C-SNP plan  04/29/2024 update -- now doing 63 units toujeo  daily, 5 units of humalog  with meals. Is taking the metformin  500mg  twice daily. Requesting sensor be sent to [facility]. Wants to start with this, then will consider getting future meds filled/transferred.   Follow Up Plan: pt to start ozempic Sunday 05/03/2024. 2wk telephone scheduled  Rolando Cliche, PharmD, [facility] Clinical Pharmacist  [phone]

## 2024-05-05 ENCOUNTER — Telehealth: Payer: Self-pay | Admitting: Cardiology

## 2024-05-05 MED ORDER — AMIODARONE HCL 200 MG PO TABS: 200.0000 mg | ORAL_TABLET | Freq: Every day | ORAL | 0 refills | Status: DC

## 2024-05-05 NOTE — Telephone Encounter (Signed)
*  STAT* If patient is at the pharmacy, call can be transferred to refill team.   1. Which medications need to be refilled? (please list name of each medication and dose if known)   amiodarone  (PACERONE ) 200 MG tablet    2. Which pharmacy/location (including street and city if local pharmacy) is medication to be sent to?  [facility] - [place], [place] - [address]      3. Do they need a 30 day or 90 day supply? 90 day    Pt is out of medication and has appt on 06/10/24

## 2024-05-05 NOTE — Telephone Encounter (Signed)
 RX sent to requested Pharmacy

## 2024-05-06 ENCOUNTER — Other Ambulatory Visit: Payer: Self-pay | Admitting: Cardiology

## 2024-05-12 ENCOUNTER — Other Ambulatory Visit: Payer: Self-pay

## 2024-05-12 DIAGNOSIS — E1165 Type 2 diabetes mellitus with hyperglycemia: Secondary | ICD-10-CM

## 2024-05-12 NOTE — Progress Notes (Unsigned)
 05/12/2024 Name: Jasmine Richardson MRN: [id number] DOB: Jan 30, 1961  No chief complaint on file.  Jasmine Richardson is a 63 y.o. year old female who was referred for medication management by their primary care provider, Cyndi Drain, PA-C. They presented for a televisit today.    They were referred to the pharmacist by their PCP for assistance in managing diabetes    Subjective:  Care Team: Primary Care Provider: Paullette Boston ; Next Scheduled Visit: Future Appointments  Date Time Provider Department Center  05/15/2024 10:05 AM CVD HVT DEVICE REMOTES [facility] H&V  06/10/2024  4:20 PM Hassan Links, MD [facility] None  07/07/2024 10:00 AM Cyndi Drain, PA-C [facility] None  08/14/2024 10:05 AM CVD HVT DEVICE REMOTES [facility]-MAG[facility] H&V  11/09/2024 10:05 AM CVD HVT DEVICE REMOTES [facility] H&V  02/08/2025 10:05 AM CVD HVT DEVICE REMOTES [facility]-MAG[facility] H&V  05/11/2025 10:05 AM CVD HVT DEVICE REMOTES [facility]-MAG[facility] H&V    Medication Access/Adherence  Current Pharmacy:  Melodee Spruce [facility] - [facility] [address] [place] [address] Phone: [phone] Fax: [phone]  [facility] [address], [place] [address] [place] [address] Phone: [phone] Fax: [phone]  [facility] [address] [place] [address] Phone: [phone] Fax: [phone]   Patient reports affordability concerns with their medications: No  Patient reports access/transportation concerns to their pharmacy: No  Patient reports adherence concerns with their medications:  No     Diabetes:  Current medications:  Metformin  500 mg twice daily  Toujeo  63 units every day Farxiga  10mg  Humalog  SS with meals  Metformin  XR 500 mg twice daily  Will start ozempic on Sundays upcoming 05/03/2024  Current glucose readings: avg glucose 253 past two weeks Using CGM meter; testing continuously  Date of Download:  04/15/2024 % Time CGM is active: 83% Average Glucose: 253 mg/dL Glucose Management Indicator:   Glucose Variability: 19 (goal <36%) Time in Goal:  - Time in range 70-180: 13% - Time above range: 94 (47% and 47%)% - Time below range: 0% Observed patterns:  04/29/2024 update - 3 day avg glucose 194, 7 day 214. Time in range 29%, no lows.    05/12/2024 update -[facility] ozem[facility] so she gave up hers  - 3 day avg 174, 7 day avg 185. 14 day 181 - at times BG was in 70s so omitted mealtime novolog . Will not take if less than 80.    Patient denies hypoglycemic s/sx including dizziness, shakiness, sweating. Patient denies hyperglycemic symptoms including, polyuria, polydipsia, polyphagia, nocturia, neuropathy, blurred vision.  Current meal patterns: high carb, processed foods  Current physical activity:   Current medication access support: just got enrolled in C-SNP - HTA DM/Heart   Objective:  Lab Results  Component Value Date   HGBA1C 9.4 (H) 02/18/2024    Lab Results  Component Value Date   CREATININE 0.96 03/13/2024   BUN 17 03/13/2024   NA 140 03/13/2024   K 4.7 03/13/2024   CL 105 03/13/2024   CO2 19 (L) 03/13/2024    Lab Results  Component Value Date   CHOL 242 (H) 03/13/2024   HDL 59 03/13/2024   LDLCALC 138 (H) 03/13/2024   LDLDIRECT 91.4 05/22/2020   TRIG 251 (H) 03/13/2024   CHOLHDL 4.1 03/13/2024    Medications Reviewed Today   Medications were not reviewed in this encounter    Assessment/Plan:   Diabetes: - Currently uncontrolled -  Reviewed long term cardiovascular and renal outcomes of uncontrolled blood sugar - Reviewed goal A1c, goal fasting, and goal 2 hour post prandial glucose - Reviewed dietary modifications including cutting out juices, teas, crackers. - Reviewed lifestyle modifications including:  - Recommend to start ozempic, sample provided by PCP, pt anticipates starting this Sunday.  --reviewed proper timing of  Humalog  prior to meals. Noted that patient had been taking anywhere from to 1+hr before a meal when taking.  --strongly encouraged consistently of medications and meals.  - Patient denies personal or family history of multiple endocrine neoplasia type 2, medullary thyroid cancer; personal history of pancreatitis or gallbladder disease. - Recommend to check glucose with CGM continuously  --counseled on insurance benefits - was just changed to H[employer] DM/Heart C-SNP plan  04/29/2024 update -- now doing 63 units toujeo  daily, 5 units of humalog  with meals. Is taking the metformin  500mg  twice daily. Requesting sensor be sent to [facility]. Wants to start with this, then will consider getting future meds filled/transferred.   Follow Up Plan: pt to start ozempic Sunday 05/03/2024. 2wk telephone scheduled  05/12/2024 -[facility] not [facility] terry[facility] ozempic - toujeo  - last needle, using 63 units daily, will not take. Send to [facility] - 3 day avg 174, 7 day avg 185. 14 day 181  Rolando Cliche, PharmD, [facility] Clinical Pharmacist  [phone]

## 2024-05-14 MED ORDER — OZEMPIC (0.25 OR 0.5 MG/DOSE) 2 MG/3ML ~~LOC~~ SOPN
0.2500 mg | PEN_INJECTOR | SUBCUTANEOUS | 1 refills | Status: AC
Start: 2024-05-14 — End: 2024-06-25
  Filled 2024-05-14: qty 3, 28d supply, fill #0

## 2024-05-14 MED ORDER — TOUJEO SOLOSTAR 300 UNIT/ML ~~LOC~~ SOPN
63.0000 [IU] | PEN_INJECTOR | Freq: Every day | SUBCUTANEOUS | 1 refills | Status: DC
  Filled 2024-05-14 – 2024-05-15 (×2): qty 6, 28d supply, fill #0
  Filled 2024-06-18: qty 6, 28d supply, fill #1

## 2024-05-15 ENCOUNTER — Other Ambulatory Visit (HOSPITAL_BASED_OUTPATIENT_CLINIC_OR_DEPARTMENT_OTHER): Payer: Self-pay

## 2024-05-15 MED ORDER — INSUPEN PEN NEEDLES 32G X 4 MM MISC
1.0000 | Freq: Every day | 1 refills | Status: AC
  Filled 2024-05-15: qty 100, 90d supply, fill #0
  Filled 2024-05-19 (×2): qty 100, 50d supply, fill #0

## 2024-05-18 ENCOUNTER — Telehealth: Payer: Self-pay | Admitting: Cardiology

## 2024-05-18 ENCOUNTER — Other Ambulatory Visit: Payer: Self-pay

## 2024-05-18 NOTE — Telephone Encounter (Signed)
 Appointment made for pt as requested.

## 2024-05-18 NOTE — Telephone Encounter (Signed)
Patient is requesting to speak with a nurse. °

## 2024-05-19 ENCOUNTER — Other Ambulatory Visit: Payer: Self-pay

## 2024-05-19 ENCOUNTER — Other Ambulatory Visit (HOSPITAL_BASED_OUTPATIENT_CLINIC_OR_DEPARTMENT_OTHER): Payer: Self-pay

## 2024-05-19 ENCOUNTER — Telehealth: Payer: Self-pay

## 2024-05-19 NOTE — Progress Notes (Signed)
   05/19/2024  [name], female   DOB: 31-Mar-1961, 63 y.o.   MRN: [id number]  Patient mistakenly showed up to office, forgot that it was a telephone visit. Stated to Sallyanne Creamer I would call patient next Monday once back in office and she let pt know. Unable to RS this afternoon d/t husband having an appt.   Patient inquired about ozempic  rx. Previously sent to [facility]. I reviewed pharmacy fill info and is currently in progress.   Rolando Cliche, PharmD, [facility]P Clinical Pharmacist  [phone]

## 2024-05-21 ENCOUNTER — Other Ambulatory Visit: Payer: Self-pay

## 2024-05-21 ENCOUNTER — Other Ambulatory Visit (HOSPITAL_BASED_OUTPATIENT_CLINIC_OR_DEPARTMENT_OTHER): Payer: Self-pay

## 2024-05-21 DIAGNOSIS — E875 Hyperkalemia: Secondary | ICD-10-CM | POA: Insufficient documentation

## 2024-05-21 DIAGNOSIS — M21172 Varus deformity, not elsewhere classified, left ankle: Secondary | ICD-10-CM | POA: Insufficient documentation

## 2024-05-21 DIAGNOSIS — Z8614 Personal history of Methicillin resistant Staphylococcus aureus infection: Secondary | ICD-10-CM | POA: Insufficient documentation

## 2024-05-24 ENCOUNTER — Ambulatory Visit (HOSPITAL_BASED_OUTPATIENT_CLINIC_OR_DEPARTMENT_OTHER)
Admission: EM | Admit: 2024-05-24 | Discharge: 2024-05-24 | Disposition: A | Discharge status: Home / Self Care | Attending: Family Medicine | Admitting: Family Medicine

## 2024-05-24 ENCOUNTER — Encounter (HOSPITAL_BASED_OUTPATIENT_CLINIC_OR_DEPARTMENT_OTHER): Payer: Self-pay

## 2024-05-24 DIAGNOSIS — L03115 Cellulitis of right lower limb: Secondary | ICD-10-CM | POA: Diagnosis not present

## 2024-05-24 MED ORDER — DOXYCYCLINE HYCLATE 100 MG PO CAPS: 100.0000 mg | ORAL_CAPSULE | Freq: Two times a day (BID) | ORAL | 0 refills | Status: DC

## 2024-05-24 NOTE — Discharge Instructions (Signed)
 Take the antibiotic as prescribed for early cellulitis of the foot.  Recommend follow-up with your doctor for any continued issues Close watch over the next 24 to 48 hours if the infection is spreading up the leg you will need to go to the ER.

## 2024-05-24 NOTE — ED Triage Notes (Signed)
 Right heel swelling and redness since Friday. + Diabetic.

## 2024-05-24 NOTE — ED Provider Notes (Signed)
 [facility]    CSN: [id number] Arrival date & time: 05/24/24  1029      History   Chief Complaint Chief Complaint  Patient presents with   Foot Swelling    HPI Jasmine Richardson is a 63 y.o. female.   63 year old female presents today with right foot swelling around the heel area, erythema and creased warmth since Friday.  The problem has worsened.  She is a diabetic and has had osteomyelitis of this foot.  Denies any fevers, chills.  No injuries to the foot     Past Medical History:  Diagnosis Date   Abnormal EKG 10/23/2021   Abnormal mammogram 08/09/2023   Abnormal mammogram of left breast 09/04/2021   Abnormal stress test 10/23/2021   Abscess of left elbow 02/18/2024   Absolute anemia 09/20/2022   Acquired varus deformity of foot, left    Acute chest pain 05/08/2016   Acute hemorrhagic cystitis 08/09/2023   AICD (automatic cardioverter/defibrillator) present    St Jude/Abbott device   AKI (acute kidney injury) (HCC) 10/23/2021   Amputation of right great toe (HCC) 09/20/2022   Angina pectoris (HCC) 09/11/2017   Anxiety 10/23/2021   B12 deficiency 09/20/2022   Bacteremia 11/22/2021   Breast wound, right, subsequent encounter 09/04/2021   Burping 05/08/2016   Cancer (HCC)    cervical - hysterectomy   Cellulitis of right breast 10/23/2021   Cellulitis, wound, post-operative 10/09/2021   Chronic diastolic heart failure (HCC) 11/06/2016   Chronic osteomyelitis of left foot (HCC) 10/16/2023   Colon cancer screening 09/20/2022   Coronary artery disease involving native coronary artery of native heart with angina pectoris (HCC) 09/12/2015   Overview:  PCI and stent of RCA 2009, last cath 2012 with medical therapy  CABG May 2017   Demand ischemia [facility])    Diabetes mellitus due to underlying condition with hyperglycemia, with long-term current use of insulin  (HCC) 09/20/2022   Diabetic foot infection (HCC) 11/21/2021   Diabetic foot ulcer (HCC) 02/16/2020    Emphysema lung (HCC) 09/11/2017   patient not aware of this dx   Encounter for preoperative assessment 10/16/2023   Essential hypertension 09/12/2015   Gangrene (HCC) 10/23/2021   GERD (gastroesophageal reflux disease) 05/08/2016   Hematoma 10/23/2021   Hiatal hernia 10/23/2021   History of methicillin resistant staphylococcus aureus (MRSA)    Hyperglycemia 10/23/2021   Hyperglycemia due to type 2 diabetes mellitus (HCC) 10/23/2021   Hyperkalemia    Hyperlipidemia 09/12/2015   Hyponatremia 10/23/2021   ICD (implantable cardioverter-defibrillator) in place 05/16/2021   Infection of great toe 10/23/2021   Intertrigo 09/20/2022   Lactic acidosis 10/23/2021   Leukocytosis 10/23/2021   Mediastinitis 06/28/2016   Morbid obesity (HCC) 05/08/2016   Myocardial infarction (HCC)    Needs flu shot 09/20/2022   NSTEMI (non-ST elevated myocardial infarction) (HCC) 05/22/2020   Obesity (BMI 30-39.9) 10/23/2021   Partial nontraumatic amputation of left foot (HCC) 09/20/2022   Perineal abscess 10/23/2021   Peripheral vascular disease (HCC)    S/P CABG (coronary artery bypass graft) 06/03/2016   Overview:  The patient underwent sternal reconstruction on 06/21/16 with pec flaps for mediastinitis from a prior CABG in May 2017. On admission, she was critically ill from sepsis and had altered mental status. She was last seen in clinic on 08/09/16 at which time she was doing well.   Severe sepsis (HCC) 06/03/2016   Sinus tachycardia 05/08/2016   Tobacco use disorder 04/20/2016   Overview:  Quit in May 2017.  Type 2 diabetes mellitus with foot ulcer (HCC) 05/08/2016   Uncontrolled type 2 diabetes mellitus with hyperglycemia (HCC) 02/18/2024   Unstable angina (HCC) 05/22/2020   Ventricular tachycardia (HCC) 12/17/2020   Vitamin D  deficiency 09/20/2022   Wound, surgical, infected 06/06/2016   Overview:  sternal    Patient Active Problem List   Diagnosis Date Noted   Acquired varus deformity of foot,  left    History of methicillin resistant staphylococcus aureus (MRSA)    Hyperkalemia    Uncontrolled type 2 diabetes mellitus with hyperglycemia (HCC) 02/18/2024   Abscess of left elbow 02/18/2024   Chronic osteomyelitis of left foot (HCC) 10/16/2023   Encounter for preoperative assessment 10/16/2023   Abnormal mammogram 08/09/2023   Acute hemorrhagic cystitis 08/09/2023   Partial nontraumatic amputation of left foot (HCC) 09/20/2022   Diabetes mellitus due to underlying condition with hyperglycemia, with long-term current use of insulin  (HCC) 09/20/2022   Vitamin D  deficiency 09/20/2022   Amputation of right great toe (HCC) 09/20/2022   Absolute anemia 09/20/2022   B12 deficiency 09/20/2022   Needs flu shot 09/20/2022   Colon cancer screening 09/20/2022   Intertrigo 09/20/2022   AICD (automatic cardioverter/defibrillator) present 03/02/2022   Myocardial infarction (HCC) 03/02/2022   Cancer (HCC) 02/28/2022   Bacteremia 11/22/2021   Diabetic foot infection (HCC) 11/21/2021   Abnormal stress test 10/23/2021   Abnormal EKG 10/23/2021   AKI (acute kidney injury) (HCC) 10/23/2021   Anxiety 10/23/2021   Cellulitis of right breast 10/23/2021   Gangrene (HCC) 10/23/2021   Hematoma 10/23/2021   Hiatal hernia 10/23/2021   Hyperglycemia 10/23/2021   Hyperglycemia due to type 2 diabetes mellitus (HCC) 10/23/2021   Hyponatremia 10/23/2021   Infection of great toe 10/23/2021   Lactic acidosis 10/23/2021   Leukocytosis 10/23/2021   Obesity (BMI 30-39.9) 10/23/2021   Perineal abscess 10/23/2021   Cellulitis, wound, post-operative 10/09/2021   Abnormal mammogram of left breast 09/04/2021   Breast wound, right, subsequent encounter 09/04/2021   Demand ischemia (HCC)    ICD (implantable cardioverter-defibrillator) in place 05/16/2021   Ventricular tachycardia 12/17/2020   Unstable angina (HCC) 05/22/2020   NSTEMI (non-ST elevated myocardial infarction) (HCC) 05/22/2020   Diabetic foot  ulcer (HCC) 02/16/2020   Angina pectoris (HCC) 09/11/2017   Emphysema lung (HCC) 09/11/2017   Chronic diastolic heart failure (HCC) 11/06/2016   Mediastinitis 06/28/2016   Wound, surgical, infected 06/06/2016   S/P CABG (coronary artery bypass graft) 06/03/2016   Severe sepsis (HCC) 06/03/2016   GERD (gastroesophageal reflux disease) 05/08/2016   Morbid obesity (HCC) 05/08/2016   Type 2 diabetes mellitus with foot ulcer (HCC) 05/08/2016   Sinus tachycardia 05/08/2016   Acute chest pain 05/08/2016   Burping 05/08/2016   Peripheral vascular disease (HCC) 05/08/2016   Tobacco use disorder 04/20/2016   Coronary artery disease involving native coronary artery of native heart with angina pectoris (HCC) 09/12/2015   Essential hypertension 09/12/2015   Hyperlipidemia 09/12/2015    Past Surgical History:  Procedure Laterality Date   ABDOMINAL AORTOGRAM W/LOWER EXTREMITY N/A 10/11/2021   Procedure: ABDOMINAL AORTOGRAM W/LOWER EXTREMITY;  Surgeon: Young Hensen, MD;  Location: [facility];  Service: Cardiovascular;  Laterality: N/A;   AMPUTATION TOE Right 02/20/2020   Procedure: AMPUTATION RIGHT GREAT  TOE;  Surgeon: Velma Ghazi, DPM;  Location: [facility] OR;  Service: Podiatry;  Laterality: Right;   BLADDER SURGERY     BONE BIOPSY Left 02/01/2022   Procedure: BONE BIOPSY LEFT FOOT, IRRIGATION AND DEBRIDEMENT;  Surgeon: Lizzie Riis, DPM;  Location: [facility];  Service: Podiatry;  Laterality: Left;   BONE BIOPSY Left 10/31/2023   Procedure: BONE BIOPSY;  Surgeon: Dot Gazella, DPM;  Location: [facility];  Service: Orthopedics/Podiatry;  Laterality: Left;   BUBBLE STUDY  11/27/2021   Procedure: BUBBLE STUDY;  Surgeon: Elmyra Haggard, MD;  Location: [facility] ENDOSCOPY;  Service: Cardiovascular;;   CARDIAC CATHETERIZATION     CORONARY ARTERY BYPASS GRAFT     CORONARY STENT INTERVENTION N/A 05/25/2020   Procedure: CORONARY STENT INTERVENTION;  Surgeon: Wenona Hamilton, MD;  [facility]: [facility];  Service: Cardiovascular;  Laterality: N/A;   CORONARY STENT INTERVENTION N/A 06/22/2020   Procedure: CORONARY STENT INTERVENTION;  Surgeon: Arleen Lacer, MD;  Location: [facility] INVASIVE CV LAB;  Service: Cardiovascular;  Laterality: N/A;   CORONARY ULTRASOUND/IVUS N/A 06/22/2020   Procedure: Intravascular Ultrasound/IVUS;  Surgeon: Arleen Lacer, MD;  Location: [facility] INVASIVE CV LAB;  Service: Cardiovascular;  Laterality: N/A;   ICD IMPLANT N/A 12/19/2020   Procedure: ICD IMPLANT;  Surgeon: Tammie Fall, MD;  Location: [facility] INVASIVE CV LAB;  Service: Cardiovascular;  Laterality: N/A;   INCISION AND DRAINAGE Right 02/19/2020   Procedure: INCISION AND DRAINAGE;  Surgeon: Charity Conch, DPM;  Location: [facility];  Service: Podiatry;  Laterality: Right;  Block done by surgeon   INCISION AND DRAINAGE OF WOUND Left 10/31/2023   Procedure: IRRIGATION AND DEBRIDEMENT WOUND;  Surgeon: Dot Gazella, DPM;  Location: [facility];  Service: Orthopedics/Podiatry;  Laterality: Left;   IRRIGATION AND DEBRIDEMENT FOOT Left 10/10/2021   Procedure: IRRIGATION AND DEBRIDEMENT FOOT;  Surgeon: Lizzie Riis, DPM;  Location: [facility];  Service: Podiatry;  Laterality: Left;   IRRIGATION AND DEBRIDEMENT FOOT Left 11/23/2021   Procedure: IRRIGATION AND DEBRIDEMENT FOOT;  Surgeon: Jennefer Moats, MD;  Location: [facility];  Service: Podiatry;  Laterality: Left;  will do local block   LEFT HEART CATH AND CORS/GRAFTS ANGIOGRAPHY N/A 05/23/2020   Procedure: LEFT HEART CATH AND CORS/GRAFTS ANGIOGRAPHY;  Surgeon: Sammy Crisp, MD;  [facility];  Service: Cardiovascular;  Laterality: N/A;   LEFT HEART CATH AND CORS/GRAFTS ANGIOGRAPHY N/A 12/19/2020   Procedure: LEFT HEART CATH AND CORS/GRAFTS ANGIOGRAPHY;  Surgeon: Odie Benne, MD;  [facility];  Service: Cardiovascular;  Laterality: N/A;   LEFT HEART CATH AND CORS/GRAFTS ANGIOGRAPHY N/A 07/21/2021   Procedure: LEFT HEART CATH AND CORS/GRAFTS  ANGIOGRAPHY;  Surgeon: Sammy Crisp, MD;  [facility]: [facility];  Service: Cardiovascular;  Laterality: N/A;   METATARSAL HEAD EXCISION Right 05/02/2020   Procedure: FIRST METATARSAL HEAD RESECTION; RIGHT FOOT WOUND CLOSURE;  Surgeon: Lizzie Riis, DPM;  Location: [facility];  Service: Podiatry;  Laterality: Right;  MAC W/LOCAL   PERIPHERAL VASCULAR BALLOON ANGIOPLASTY Left 10/11/2021   Procedure: PERIPHERAL VASCULAR BALLOON ANGIOPLASTY;  Surgeon: Young Hensen, MD;  [facility];  Service: Cardiovascular;  Laterality: Left;  Anterior Tibial Artery   TEE WITHOUT CARDIOVERSION N/A 11/27/2021   Procedure: TRANSESOPHAGEAL ECHOCARDIOGRAM (TEE);  Surgeon: Elmyra Haggard, MD;  Location: [facility] ENDOSCOPY;  Service: Cardiovascular;  Laterality: N/A;   TIBIALIS TENDON TRANSFER / REPAIR Left 12/06/2023   Procedure: TIBIALIS TENDON TRANSFER;  Surgeon: Dot Gazella, DPM;  Location: [facility] ORS;  Service: Orthopedics/Podiatry;  Laterality: Left;   TRANSMETATARSAL AMPUTATION Left 11/23/2021   Procedure: TRANSMETATARSAL AMPUTATION;  Surgeon: Jennefer Moats, MD;  Location: [facility] OR;  Service: Podiatry;  Laterality: Left;   TUBAL LIGATION      OB History   No obstetric history on file.      Home Medications    Prior to Admission medications   Medication Sig Start Date End Date Taking? Authorizing Provider  doxycycline  (VIBRAMYCIN ) 100 MG capsule Take 1 capsule (100 mg total) by mouth 2 (two) times daily. 05/24/24  Yes Adriane Gabbert A, FNP  acetaminophen  (TYLENOL ) 325 MG tablet Take 2 tablets (650 mg total) by mouth every 4 (four) hours as needed for headache or mild pain. 10/14/21   Samtani, Jai-Gurmukh, MD  amiodarone  (PACERONE ) 200 MG tablet Take 1 tablet (200 mg total) by mouth daily. 05/05/24   Hassan Links, MD  aspirin  81 MG chewable tablet Chew 81 mg by mouth daily.    [provider]  clopidogrel  (PLAVIX ) 75 MG tablet Take 1 tablet by mouth once  daily 05/06/24   Hassan Links, MD  Continuous Glucose Sensor (DEXCOM G7 SENSOR) MISC Change sensor every 10 days as directed 04/29/24   Cyndi Drain, PA-C  Evolocumab  (REPATHA  SURECLICK) 140 MG/ML SOAJ Inject 140 mg into the skin every 14 (fourteen) days. 03/13/24   Cyndi Drain, PA-C  FARXIGA  10 MG TABS tablet Take 1 tablet by mouth once daily 04/06/24   Cyndi Drain, PA-C  furosemide  (LASIX ) 20 MG tablet Take 1 tablet (20 mg total) by mouth daily. Patient taking differently: Take 20 mg by mouth every other day. 08/06/22   Hassan Links, MD  HUMALOG  KWIKPEN 100 UNIT/ML KwikPen Inject 10-50 Units into the skin at bedtime. Sliding scale 12/26/22   Cyndi Drain, PA-C  insulin  glargine, 1 Unit Dial , (TOUJEO  SOLOSTAR) 300 UNIT/ML Solostar Pen Inject 63 Units into the skin daily. 05/14/24   CoxBurleigh Carp, MD  Insulin  Pen Needle (INSUPEN PEN NEEDLES) 32G X 4 MM MISC Use as directed once daily with insulin . 05/15/24   Cox, Kirsten, MD  LORazepam  (ATIVAN ) 1 MG tablet Take 1 tablet (1 mg total) by mouth as needed for anxiety. 05/23/22   Cyndi Drain, PA-C  metFORMIN  (GLUCOPHAGE ) 500 MG tablet TAKE 1 TABLET BY MOUTH TWICE DAILY WITH A MEAL 01/30/24   Cyndi Drain, PA-C  metoprolol  succinate (TOPROL -XL) 25 MG 24 hr tablet TAKE 1 TABLET BY MOUTH IN THE MORNING, AT NOON AND AT BEDTIME 05/06/24   Hassan Links, MD  mupirocin  ointment (BACTROBAN ) 2 % Apply 1 Application topically 2 (two) times daily. 02/05/24   Raspet, Erin K, PA-C  nitroGLYCERIN  (NITROSTAT ) 0.4 MG SL tablet Place 1 tablet (0.4 mg total) under the tongue every 5 (five) minutes as needed for chest pain. 09/20/22   Cyndi Drain, PA-C  nystatin  (MYCOSTATIN /NYSTOP ) powder Apply 1 Application topically daily as needed (rash/yeast). 10/16/23   Cyndi Drain, PA-C  Omega-3 Fatty Acids (FISH OIL) 500 MG CAPS Take 500 mg by mouth at bedtime.    [provider]  omeprazole  (PRILOSEC) 20 MG capsule Take 1 capsule (20 mg total) by mouth daily. Patient taking  differently: Take 20 mg by mouth every morning. 08/09/23   Cyndi Drain, PA-C  promethazine  (PHENERGAN ) 25 MG tablet Take 25 mg by mouth every 6 (six) hours as needed for nausea or vomiting.    [provider]  rosuvastatin  (CRESTOR ) 20 MG tablet Take 1 tablet (20 mg total) by mouth daily. Patient taking differently: Take 20 mg by mouth at bedtime. 04/29/23   Hassan Links, MD  Semaglutide ,0.25 or 0.5MG /DOS, (OZEMPIC , 0.25 OR 0.5 MG/DOSE,) 2 MG/3ML SOPN  Inject 0.25 mg into the skin once weekly for 4 weeks, then increase to 0.5 mg once weekly 05/14/24 06/25/24  Cox, Burleigh Carp, MD  senna-docusate (SENOKOT-S) 8.6-50 MG tablet Take 1 tablet by mouth every other day. 11/28/21   [provider]  valsartan  (DIOVAN ) 80 MG tablet Take 1 tablet (80 mg total) by mouth 2 (two) times daily. 04/29/23   Hassan Links, MD  Vitamin D , Ergocalciferol , (DRISDOL ) 1.25 MG (50000 UNIT) CAPS capsule TAKE 1 CAPSULE BY MOUTH EVERY SATURDAY 03/30/24   Cyndi Drain, PA-C    Family History Family History  Problem Relation Age of Onset   Hypertension Mother    Hyperlipidemia Mother    Heart attack Father    Heart disease Father    Hypertension Father    Alzheimer's disease Father    Hypertension Brother    Hyperlipidemia Brother     Social History Social History   Tobacco Use   Smoking status: Former    Current packs/day: 0.00    Types: Cigarettes    Quit date: 05/2016    Years since quitting: 8.0   Smokeless tobacco: Never  Vaping Use   Vaping status: Never Used  Substance Use Topics   Alcohol use: Yes    Comment: rare   Drug use: No     Allergies   Vancomycin , Chlorhexidine  gluconate, Cat dander, Other, Tramadol , and Codeine   Review of Systems Review of Systems See HPI  Physical Exam Triage Vital Signs ED Triage Vitals  Encounter Vitals Group     BP 05/24/24 1206 (!) 148/90     Systolic BP Percentile --      Diastolic BP Percentile --      Pulse Rate 05/24/24 1206 85      Resp 05/24/24 1206 20     Temp 05/24/24 1206 98.5 F (36.9 C)     Temp Source 05/24/24 1206 Oral     SpO2 05/24/24 1206 98 %     Weight --      Height --      Head Circumference --      Peak Flow --      Pain Score 05/24/24 1208 5     Pain Loc --      Pain Education --      Exclude from Growth Chart --    No data found.  Updated Vital Signs BP (!) 148/90 (BP Location: Right Arm)   Pulse 85   Temp 98.5 F (36.9 C) (Oral)   Resp 20   LMP  (LMP Unknown)   SpO2 98%   Visual Acuity Right Eye Distance:   Left Eye Distance:   Bilateral Distance:    Right Eye Near:   Left Eye Near:    Bilateral Near:     Physical Exam Vitals and nursing note reviewed.  Constitutional:      General: She is not in acute distress.    Appearance: Normal appearance. She is not ill-appearing, toxic-appearing or diaphoretic.  Pulmonary:     Effort: Pulmonary effort is normal.  Musculoskeletal:        General: Normal range of motion.  Skin:    Findings: Erythema present.     Comments: See picture for detail  Neurological:     Mental Status: She is alert.  Psychiatric:        Mood and Affect: Mood normal.      UC Treatments / Results  Labs (all labs ordered are listed, but only abnormal results are  displayed) Labs Reviewed - No data to display  EKG   Radiology No results found.  Procedures Procedures (including critical care time)  Medications Ordered in UC Medications - No data to display  Initial Impression / Assessment and Plan / UC Course  I have reviewed the triage vital signs and the nursing notes.  Pertinent labs & imaging results that were available during my care of the patient were reviewed by me and considered in my medical decision making (see chart for details).     Cellulitis-I believe patient has cellulitis of the right foot.  I am treating with antibiotics.  Does have history of MRSA and osteomyelitis.  Has had amputations on  same foot.  Treating with  doxycycline .  Recommend close watch over the area for the next 24 to 48 hours and if the infection starts to spread she will need to go to the ER patient understanding and agree. Final Clinical Impressions(s) / UC Diagnoses   Final diagnoses:  Cellulitis of right lower extremity     Discharge Instructions      Take the antibiotic as prescribed for early cellulitis of the foot.  Recommend follow-up with your doctor for any continued issues Close watch over the next 24 to 48 hours if the infection is spreading up the leg you will need to go to the ER.   ED Prescriptions     Medication Sig Dispense Auth. Provider   doxycycline  (VIBRAMYCIN ) 100 MG capsule Take 1 capsule (100 mg total) by mouth 2 (two) times daily. 20 capsule Landa Pine, FNP      PDMP not reviewed this encounter.   Landa Pine, FNP 05/24/24 [phone]

## 2024-05-26 ENCOUNTER — Ambulatory Visit

## 2024-05-26 ENCOUNTER — Other Ambulatory Visit (HOSPITAL_BASED_OUTPATIENT_CLINIC_OR_DEPARTMENT_OTHER): Payer: Self-pay

## 2024-05-26 VITALS — BP 120/72 | HR 81 | Ht 67.0 in | Wt 220.2 lb

## 2024-05-26 DIAGNOSIS — I472 Ventricular tachycardia, unspecified: Secondary | ICD-10-CM

## 2024-05-26 DIAGNOSIS — I5032 Chronic diastolic (congestive) heart failure: Secondary | ICD-10-CM | POA: Diagnosis not present

## 2024-05-26 DIAGNOSIS — I10 Essential (primary) hypertension: Secondary | ICD-10-CM

## 2024-05-26 DIAGNOSIS — E782 Mixed hyperlipidemia: Secondary | ICD-10-CM | POA: Diagnosis not present

## 2024-05-26 DIAGNOSIS — I25119 Atherosclerotic heart disease of native coronary artery with unspecified angina pectoris: Secondary | ICD-10-CM

## 2024-05-26 DIAGNOSIS — E119 Type 2 diabetes mellitus without complications: Secondary | ICD-10-CM | POA: Diagnosis not present

## 2024-05-26 MED ORDER — CLOPIDOGREL BISULFATE 75 MG PO TABS
75.0000 mg | ORAL_TABLET | Freq: Every day | ORAL | 3 refills | Status: AC
  Filled 2024-05-26 – 2024-09-30 (×3): qty 90, 90d supply, fill #0

## 2024-05-26 NOTE — Assessment & Plan Note (Addendum)
 Follows up with electrophysiologist and last device check normal without any significant abnormalities.  Remains on amiodarone  200 mg once daily. Normal TSH, LFTs and recent check. Will reassess LFTs at subsequent follow-up visit in 3 months.  Continue device check with device clinic as scheduled.

## 2024-05-26 NOTE — Patient Instructions (Signed)
 Medication Instructions:  Your physician recommends that you continue on your current medications as directed. Please refer to the Current Medication list given to you today.  *If you need a refill on your cardiac medications before your next appointment, please call your pharmacy*   Lab Work: Your physician recommends that you return for lab work in: 3 months prior to your follow up visit for CMP and magnesium  You can come Monday through Friday 8:30 am to 12:00 pm and 1:15 to 4:30. You do not need to make an appointment as the order has already been placed.   If you have labs (blood work) drawn today and your tests are completely normal, you will receive your results only by: MyChart Message (if you have MyChart) OR A paper copy in the mail If you have any lab test that is abnormal or we need to change your treatment, we will call you to review the results.   Testing/Procedures: None ordered   Follow-Up: At [facility], you and your health needs are our priority.  As part of our continuing mission to provide you with exceptional heart care, we have created designated Provider Care Teams.  These Care Teams include your primary Cardiologist (physician) and Advanced Practice Providers (APPs -  Physician Assistants and Nurse Practitioners) who all work together to provide you with the care you need, when you need it.  We recommend signing up for the patient portal called "MyChart".  Sign up information is provided on this After Visit Summary.  MyChart is used to connect with patients for Virtual Visits (Telemedicine).  Patients are able to view lab/test results, encounter notes, upcoming appointments, etc.  Non-urgent messages can be sent to your provider as well.   To learn more about what you can do with MyChart, go to [email or web address].    Your next appointment:   3 month(s)  The format for your next appointment:   In Person  Provider:   Bertha Broad, MD     Other Instructions none  Important Information About Sugar

## 2024-05-26 NOTE — Progress Notes (Signed)
 Cardiology Consultation:    Date:  05/26/2024   ID:  Jasmine Richardson, DOB Feb 28, 1961, MRN [id number]  PCP:  Jasmine Drain, PA-C  Cardiologist:  Jasmine Richardson Jasmine Cipriani, MD   Referring MD: Jasmine Drain, PA-C   No chief complaint on file.    ASSESSMENT AND PLAN:   Jasmine Richardson 63 year old woman with history  of CAD s/p CABG in 2017 and NSTEMI s/p cath noted occluded SVG to RCA PCI of native RCA done June 2021, last cardiac cath August 2022 with patent LIMA-LAD graft and left main stents with moderate restenosis of mid RCA recommended to continue medical therapy [known occluded SVG to OM graft and occluded SVG-PDA], ischemic cardiomyopathy with chronic CHF recovered LVEF 60 to 65% on echocardiogram March 2024 without significant valve abnormalities, history of VT s/p ICD and on chronic amiodarone  therapy, right bundle branch block, hypertension, hyperlipidemia, peripheral vascular disease s/p left partial foot amputation and right foot great toe amputation, diabetes mellitus.  Here for routine follow-up visit.  Problem List Items Addressed This Visit     Chronic diastolic heart failure (HCC)   Recovered LVEF, last echocardiogram March 2024 with EF 60 to 65%.  Appears compensated euvolemic. Continue with furosemide  20 mg once daily. Recommended strongly to cut salt intake down to 2 g/day. Fluid restriction to 2 L/day.  Continue with guideline directed medical therapy currently on metoprolol  succinate 25 mg 3 times daily, recommended to take scheduled at the same time for combined dose of 75 mg a day. Continue Farxiga  10 mg once daily, cautioned about potential side effects of perineal infections and urinary tract infections. She denies any recent urinary tract infections or groin infections.  Uses nystatin  for groin folds to avoid moistness. Continue valsartan  80 mg twice daily. Hold off on adding spironolactone at this time given compensated heart failure and recovered LVEF.  Jasmine Richardson        Coronary artery disease involving native coronary artery of native heart with angina pectoris (HCC) - Primary   History of CAD s/p CABG in 2017 and NSTEMI s/p cath noted occluded SVG to RCA PCI of native RCA done June 2021, last cardiac cath August 2022 with patent LIMA-LAD graft and left main stents with moderate restenosis of mid RCA recommended to continue medical therapy [known occluded SVG to OM graft and occluded SVG-PDA]  Continue with dual antiplatelet therapy aspirin  81 mg once daily and Plavix  75 mg once daily given significant peripheral vascular disease and coronary artery disease history. Continue with rosuvastatin  20 mg once daily.        Relevant Orders   EKG 12-Lead (Completed)   Comprehensive metabolic panel with GFR   Magnesium    Essential hypertension   Well-controlled on regimen. Target below 130/80 mmHg. Continue current medication valsartan , metoprolol  succinate.       Hyperlipidemia   Suboptimal on recent lipid panel from 03-05-2024 with LDL 138 and HDL 59, total cholesterol 409 and triglycerides 251.  This in the context of poorly controlled hemoglobin A1c.  Recommended to aggressively control her diabetes. Will recheck lipid panel at subsequent follow-up visit in 3 months and if still remains elevated, will further escalate rosuvastatin  dose.  Currently continues both on rosuvastatin  20 mg once daily and Repatha  140 mg subcutaneous injection every 14 days.      Ventricular tachycardia   Follows up with electrophysiologist and last device check normal without any significant abnormalities.  Remains on amiodarone  200 mg once daily. Normal TSH, LFTs and recent check. Will reassess  LFTs at subsequent follow-up visit in 3 months.  Continue device check with device clinic as scheduled.      Relevant Orders   Comprehensive metabolic panel with GFR   Magnesium     Return to clinic in 3 months  History of Present Illness:    Jasmine Richardson is a 63 y.o.  female who is being seen today for follow-up visit. PCP is Jasmine Drain, PA-C. Last visit at our office was with Jasmine Richardson 03-16-2023.  Here for the visit today accompanied by her husband.  Has history of CAD s/p CABG in 2017 and NSTEMI s/p cath noted occluded SVG to RCA PCI of native RCA done June 2021, last cardiac cath August 2022 with patent LIMA-LAD graft and left main stents with moderate restenosis of mid RCA recommended to continue medical therapy [known occluded SVG to OM graft and occluded SVG-PDA], ischemic cardiomyopathy with chronic CHF recovered LVEF 60 to 65% on echocardiogram March 2024 without significant valve abnormalities, history of VT s/p ICD and on chronic amiodarone  therapy, right bundle branch block, hypertension, hyperlipidemia, peripheral vascular disease s/p left partial foot amputation and right foot great toe amputation, diabetes mellitus.  Mentions overall doing well.  Denies any chest pain.  Shortness of breath is at baseline. Denies any palpitations, lightheadedness, syncopal episodes. Right foot medial aspect over the heel she has mild redness for couple days and has pending visit with podiatry tomorrow.  No open wounds.  Denies any fevers or chills. Denies any orthopnea. Denies any blood in urine or stools. Denies any recent urinary tract infections.  Tells me that she was recently started on doxycycline  after urgent care visit for her right foot redness.  Last device check 02-06-2024 with normal functioning and followed up with device clinic.  Lipid panel from 03-05-2024 with total cholesterol 242, triglycerides 251, HDL 59, LDL 138.  Suboptimal. TSH normal 3.63. BUN 17, creatinine 0.96, EGFR 66. Normal transaminases and alkaline phosphatase. CBC with hemoglobin 13.1 and hematocrit 39.1, platelets 291 and WBC 8.6. Hemoglobin A1c from 02-18-2024 was uncontrolled 9.4.   Past Medical History:  Diagnosis Date   Abnormal EKG 10/23/2021   Abnormal mammogram  08/09/2023   Abnormal mammogram of left breast 09/04/2021   Abnormal stress test 10/23/2021   Abscess of left elbow 02/18/2024   Absolute anemia 09/20/2022   Acquired varus deformity of foot, left    Acute chest pain 05/08/2016   Acute hemorrhagic cystitis 08/09/2023   AICD (automatic cardioverter/defibrillator) present    St [employer]/Abbott device   AKI (acute kidney injury) (HCC) 10/23/2021   Amputation of right great toe (HCC) 09/20/2022   Angina pectoris (HCC) 09/11/2017   Anxiety 10/23/2021   B12 deficiency 09/20/2022   Bacteremia 11/22/2021   Breast wound, right, subsequent encounter 09/04/2021   Burping 05/08/2016   Cancer (HCC)    cervical - hysterectomy   Cellulitis of right breast 10/23/2021   Cellulitis, wound, post-operative 10/09/2021   Chronic diastolic heart failure (HCC) 11/06/2016   Chronic osteomyelitis of left foot (HCC) 10/16/2023   Colon cancer screening 09/20/2022   Coronary artery disease involving native coronary artery of native heart with angina pectoris (HCC) 09/12/2015   Overview:  PCI and stent of RCA 2009, last cath 2012 with medical therapy  CABG May 2017   Demand ischemia [facility])    Diabetes mellitus due to underlying condition with hyperglycemia, with long-term current use of insulin  (HCC) 09/20/2022   Diabetic foot infection (HCC) 11/21/2021   Diabetic foot ulcer (HCC) 02/16/2020  Emphysema lung (HCC) 09/11/2017   patient not aware of this dx   Encounter for preoperative assessment 10/16/2023   Essential hypertension 09/12/2015   Gangrene (HCC) 10/23/2021   GERD (gastroesophageal reflux disease) 05/08/2016   Hematoma 10/23/2021   Hiatal hernia 10/23/2021   History of methicillin resistant staphylococcus aureus (MRSA)    Hyperglycemia 10/23/2021   Hyperglycemia due to type 2 diabetes mellitus (HCC) 10/23/2021   Hyperkalemia    Hyperlipidemia 09/12/2015   Hyponatremia 10/23/2021   ICD (implantable cardioverter-defibrillator) in place 05/16/2021    Infection of great toe 10/23/2021   Intertrigo 09/20/2022   Lactic acidosis 10/23/2021   Leukocytosis 10/23/2021   Mediastinitis 06/28/2016   Morbid obesity (HCC) 05/08/2016   Myocardial infarction [facility])    Needs flu shot 09/20/2022   NSTEMI (non-ST elevated myocardial infarction) (HCC) 05/22/2020   Obesity (BMI 30-39.9) 10/23/2021   Partial nontraumatic amputation of left foot (HCC) 09/20/2022   Perineal abscess 10/23/2021   Peripheral vascular disease (HCC)    S/P CABG (coronary artery bypass graft) 06/03/2016   Overview:  The patient underwent sternal reconstruction on 06/21/16 with pec flaps for mediastinitis from a prior CABG in May 2017. On admission, she was critically ill from sepsis and had altered mental status. She was last seen in clinic on 08/09/16 at which time she was doing well.   Severe sepsis (HCC) 06/03/2016   Sinus tachycardia 05/08/2016   Tobacco use disorder 04/20/2016   Overview:  Quit in May 2017.   Type 2 diabetes mellitus with foot ulcer (HCC) 05/08/2016   Uncontrolled type 2 diabetes mellitus with hyperglycemia (HCC) 02/18/2024   Unstable angina (HCC) 05/22/2020   Ventricular tachycardia (HCC) 12/17/2020   Vitamin D  deficiency 09/20/2022   Wound, surgical, infected 06/06/2016   Overview:  sternal    Past Surgical History:  Procedure Laterality Date   ABDOMINAL AORTOGRAM W/LOWER EXTREMITY N/A 10/11/2021   Procedure: ABDOMINAL AORTOGRAM W/LOWER EXTREMITY;  Surgeon: Young Hensen, MD;  Location: [facility];  Service: Cardiovascular;  Laterality: N/A;   AMPUTATION TOE Right 02/20/2020   Procedure: AMPUTATION RIGHT GREAT  TOE;  Surgeon: Velma Ghazi, DPM;  Location: [facility] OR;  Service: Podiatry;  Laterality: Right;   BLADDER SURGERY     BONE BIOPSY Left 02/01/2022   Procedure: BONE BIOPSY LEFT FOOT, IRRIGATION AND DEBRIDEMENT;  Surgeon: Lizzie Riis, DPM;  Location: [facility] OR;  Service: Podiatry;  Laterality: Left;   BONE BIOPSY Left 10/31/2023    Procedure: BONE BIOPSY;  Surgeon: Dot Gazella, DPM;  Location: [facility] ORS;  Service: Orthopedics/Podiatry;  Laterality: Left;   BUBBLE STUDY  11/27/2021   Procedure: BUBBLE STUDY;  Surgeon: Elmyra Haggard, MD;  Location: [facility] ENDOSCOPY;  Service: Cardiovascular;;   CARDIAC CATHETERIZATION     CORONARY ARTERY BYPASS GRAFT     CORONARY STENT INTERVENTION N/A 05/25/2020   Procedure: CORONARY STENT INTERVENTION;  Surgeon: Wenona Hamilton, MD;  Location: [facility];  Service: Cardiovascular;  Laterality: N/A;   CORONARY STENT INTERVENTION N/A 06/22/2020   Procedure: CORONARY STENT INTERVENTION;  Surgeon: Arleen Lacer, MD;  Location: [facility] INVASIVE CV LAB;  Service: Cardiovascular;  Laterality: N/A;   CORONARY ULTRASOUND/IVUS N/A 06/22/2020   Procedure: Intravascular Ultrasound/IVUS;  Surgeon: Arleen Lacer, MD;  Location: [facility] INVASIVE CV LAB;  Service: Cardiovascular;  Laterality: N/A;   ICD IMPLANT N/A 12/19/2020   Procedure: ICD IMPLANT;  Surgeon: Tammie Fall, MD;  Location: [facility] INVASIVE CV LAB;  Service: Cardiovascular;  Laterality: N/A;   INCISION AND DRAINAGE Right 02/19/2020   Procedure: INCISION AND DRAINAGE;  Surgeon: Charity Conch, DPM;  Location: [facility];  Service: Podiatry;  Laterality: Right;  Block done by surgeon   INCISION AND DRAINAGE OF WOUND Left 10/31/2023   Procedure: IRRIGATION AND DEBRIDEMENT WOUND;  Surgeon: Dot Gazella, DPM;  Location: [facility] ORS;  Service: Orthopedics/Podiatry;  Laterality: Left;   IRRIGATION AND DEBRIDEMENT FOOT Left 10/10/2021   Procedure: IRRIGATION AND DEBRIDEMENT FOOT;  Surgeon: Lizzie Riis, DPM;  Location: [facility];  Service: Podiatry;  Laterality: Left;   IRRIGATION AND DEBRIDEMENT FOOT Left 11/23/2021   Procedure: IRRIGATION AND DEBRIDEMENT FOOT;  Surgeon: Jennefer Moats, MD;  Location: [facility];  Service: Podiatry;  Laterality: Left;  will do local block   LEFT HEART CATH AND CORS/GRAFTS ANGIOGRAPHY N/A 05/23/2020   Procedure: LEFT HEART CATH AND  CORS/GRAFTS ANGIOGRAPHY;  Surgeon: Sammy Crisp, MD;  Location: [facility];  Service: Cardiovascular;  Laterality: N/A;   LEFT HEART CATH AND CORS/GRAFTS ANGIOGRAPHY N/A 12/19/2020   Procedure: LEFT HEART CATH AND CORS/GRAFTS ANGIOGRAPHY;  Surgeon: Odie Benne, MD;  [facility]: [facility];  Service: Cardiovascular;  Laterality: N/A;   LEFT HEART CATH AND CORS/GRAFTS ANGIOGRAPHY N/A 07/21/2021   Procedure: LEFT HEART CATH AND CORS/GRAFTS ANGIOGRAPHY;  Surgeon: Sammy Crisp, MD;  [facility]: [facility];  Service: Cardiovascular;  Laterality: N/A;   METATARSAL HEAD EXCISION Right 05/02/2020   Procedure: FIRST METATARSAL HEAD RESECTION; RIGHT FOOT WOUND CLOSURE;  Surgeon: Lizzie Riis, DPM;  Location: [facility];  Service: Podiatry;  Laterality: Right;  MAC W/LOCAL   PERIPHERAL VASCULAR BALLOON ANGIOPLASTY Left 10/11/2021   Procedure: PERIPHERAL VASCULAR BALLOON ANGIOPLASTY;  Surgeon: Young Hensen, MD;  Location: [facility];  Service: Cardiovascular;  Laterality: Left;  Anterior Tibial Artery   TEE WITHOUT CARDIOVERSION N/A 11/27/2021   Procedure: TRANSESOPHAGEAL ECHOCARDIOGRAM (TEE);  Surgeon: Elmyra Haggard, MD;  Location: [facility] ENDOSCOPY;  Service: Cardiovascular;  Laterality: N/A;   TIBIALIS TENDON TRANSFER / REPAIR Left 12/06/2023   Procedure: TIBIALIS TENDON TRANSFER;  Surgeon: Dot Gazella, DPM;  Location: [facility] ORS;  Service: Orthopedics/Podiatry;  Laterality: Left;   TRANSMETATARSAL AMPUTATION Left 11/23/2021   Procedure: TRANSMETATARSAL AMPUTATION;  Surgeon: Jennefer Moats, MD;  Location: [facility] OR;  Service: Podiatry;  Laterality: Left;   TUBAL LIGATION      Current Medications: Current Meds  Medication Sig   acetaminophen  (TYLENOL ) 325 MG tablet Take 2 tablets (650 mg total) by mouth every 4 (four) hours as needed for headache or mild pain.   amiodarone  (PACERONE ) 200 MG tablet Take 1 tablet (200 mg total) by mouth daily.    aspirin  81 MG chewable tablet Chew 81 mg by mouth daily.   Continuous Glucose Sensor (DEXCOM G7 SENSOR) MISC Change sensor every 10 days as directed   doxycycline  (VIBRAMYCIN ) 100 MG capsule Take 1 capsule (100 mg total) by mouth 2 (two) times daily.   Evolocumab  (REPATHA  SURECLICK) 140 MG/ML SOAJ Inject 140 mg into the skin every 14 (fourteen) days.   FARXIGA  10 MG TABS tablet Take 1 tablet by mouth once daily   furosemide  (LASIX ) 20 MG tablet Take 1 tablet (20 mg total) by mouth daily. (Patient taking differently: Take 20 mg by mouth every other day.)   HUMALOG  KWIKPEN 100 UNIT/ML KwikPen Inject 10-50 Units into the skin at bedtime. Sliding scale   insulin  glargine, 1 Unit Dial , (TOUJEO  SOLOSTAR) 300 UNIT/ML Solostar Pen  Inject 63 Units into the skin daily.   Insulin  Pen Needle (INSUPEN PEN NEEDLES) 32G X 4 MM MISC Use as directed once daily with insulin .   metFORMIN  (GLUCOPHAGE ) 500 MG tablet TAKE 1 TABLET BY MOUTH TWICE DAILY WITH A MEAL   mupirocin  ointment (BACTROBAN ) 2 % Apply 1 Application topically 2 (two) times daily.   nitroGLYCERIN  (NITROSTAT ) 0.4 MG SL tablet Place 1 tablet (0.4 mg total) under the tongue every 5 (five) minutes as needed for chest pain.   nystatin  (MYCOSTATIN /NYSTOP ) powder Apply 1 Application topically daily as needed (rash/yeast).   rosuvastatin  (CRESTOR ) 20 MG tablet Take 1 tablet (20 mg total) by mouth daily.   Semaglutide ,0.25 or 0.5MG /DOS, (OZEMPIC , 0.25 OR 0.5 MG/DOSE,) 2 MG/3ML SOPN Inject 0.25 mg into the skin once weekly for 4 weeks, then increase to 0.5 mg once weekly   valsartan  (DIOVAN ) 80 MG tablet Take 1 tablet (80 mg total) by mouth 2 (two) times daily.   Vitamin D , Ergocalciferol , (DRISDOL ) 1.25 MG (50000 UNIT) CAPS capsule TAKE 1 CAPSULE BY MOUTH EVERY SATURDAY   [DISCONTINUED] clopidogrel  (PLAVIX ) 75 MG tablet Take 1 tablet by mouth once daily     Allergies:   Vancomycin , Chlorhexidine  gluconate, Cat dander, Other, Tramadol , and Codeine   Social  History   Socioeconomic History   Marital status: Married    Spouse name: Not on file   Number of children: Not on file   Years of education: Not on file   Highest education level: Not on file  Occupational History   Not on file  Tobacco Use   Smoking status: Former    Current packs/day: 0.00    Types: Cigarettes    Quit date: 05/2016    Years since quitting: 8.0   Smokeless tobacco: Never  Vaping Use   Vaping status: Never Used  Substance and Sexual Activity   Alcohol use: Yes    Comment: rare   Drug use: No   Sexual activity: Yes    Birth control/protection: Surgical    Comment: Hysterectomy  Other Topics Concern   Not on file  Social History Narrative   Not on file   Social Drivers of Health   Financial Resource Strain: Low Risk  (10/16/2023)   Overall Financial Resource Strain (CARDIA)    Difficulty of Paying Living Expenses: Not hard at all  Food Insecurity: No Food Insecurity (10/16/2023)   Hunger Vital Sign    Worried About Running Out of Food in the Last Year: Never true    Ran Out of Food in the Last Year: Never true  Transportation Needs: No Transportation Needs (10/16/2023)   PRAPARE - Administrator, Civil Service (Medical): No    Lack of Transportation (Non-Medical): No  Physical Activity: Inactive (10/16/2023)   Exercise Vital Sign    Days of Exercise per Week: 0 days    Minutes of Exercise per Session: 0 min  Stress: No Stress Concern Present (10/16/2023)   [employer] of Occupational Health - Occupational Stress Questionnaire    Feeling of Stress : Not at all  Social Connections: Socially Integrated (10/16/2023)   Social Connection and Isolation Panel [NHANES]    Frequency of Communication with Friends and Family: More than three times a week    Frequency of Social Gatherings with Friends and Family: More than three times a week    Attends Religious Services: More than 4 times per year    Active Member of [employer] or Organizations:  Yes  Attends Engineer, structural: More than 4 times per year    Marital Status: Married     Family History: The patient's family history includes Alzheimer's disease in her father; Heart attack in her father; Heart disease in her father; Hyperlipidemia in her brother and mother; Hypertension in her brother, father, and mother. ROS:   Please see the history of present illness.    All 14 point review of systems negative except as described per history of present illness.  EKGs/Labs/Other Studies Reviewed:    The following studies were reviewed today:   EKG:       Recent Labs: 03/13/2024: ALT 14; BUN 17; Creatinine, Ser 0.96; Hemoglobin 13.1; Platelets 291; Potassium 4.7; Sodium 140; TSH 3.630  Recent Lipid Panel    Component Value Date/Time   CHOL 242 (H) 03/13/2024 1036   TRIG 251 (H) 03/13/2024 1036   HDL 59 03/13/2024 1036   CHOLHDL 4.1 03/13/2024 1036   CHOLHDL 2.7 07/20/2021 1232   VLDL 37 07/20/2021 1232   LDLCALC 138 (H) 03/13/2024 1036   LDLDIRECT 91.4 05/22/2020 2122    Physical Exam:    VS:  BP 120/72   Pulse 81   Ht 5\' 7"  (1.702 m)   Wt 220 lb 3.2 oz (99.9 kg)   LMP  (LMP Unknown)   SpO2 94%   BMI 34.49 kg/m     Wt Readings from Last 3 Encounters:  05/26/24 220 lb 3.2 oz (99.9 kg)  03/13/24 224 lb 6.4 oz (101.8 kg)  02/18/24 216 lb 3.2 oz (98.1 kg)     GENERAL:  Well nourished, well developed in no acute distress NECK: No JVD; No carotid bruits CARDIAC: Chest wall with sternotomy scar and elevated upper part of the chest wall without any obvious swelling or pulsatile enlargement.  No tenderness. Left infraclavicular pacemaker generator palpable. RRR, S1 and S2 present, no murmurs, no rubs, no gallops CHEST:  Clear to auscultation without rales, wheezing or rhonchi  Extremities: No pitting pedal edema.  Left foot partial amputation and right great toe amputation.  Redness over the medial aspect of the right foot by the heel without any open  wounds, no pulsatile swelling no tenderness. NEUROLOGIC:  Alert and oriented x 3  Medication Adjustments/Labs and Tests Ordered: Current medicines are reviewed at length with the patient today.  Concerns regarding medicines are outlined above.  Orders Placed This Encounter  Procedures   Comprehensive metabolic panel with GFR   Magnesium    EKG 12-Lead   Meds ordered this encounter  Medications   clopidogrel  (PLAVIX ) 75 MG tablet    Sig: Take 1 tablet (75 mg total) by mouth daily.    Dispense:  90 tablet    Refill:  3    Signed, Odarius Dines reddy Taffie Eckmann, MD, MPH, [facility]. 05/26/2024 4:32 PM    [facility]

## 2024-05-26 NOTE — Assessment & Plan Note (Signed)
 History of CAD s/p CABG in 2017 and NSTEMI s/p cath noted occluded SVG to RCA PCI of native RCA done June 2021, last cardiac cath August 2022 with patent LIMA-LAD graft and left main stents with moderate restenosis of mid RCA recommended to continue medical therapy [known occluded SVG to OM graft and occluded SVG-PDA]  Continue with dual antiplatelet therapy aspirin  81 mg once daily and Plavix  75 mg once daily given significant peripheral vascular disease and coronary artery disease history. Continue with rosuvastatin  20 mg once daily.

## 2024-05-26 NOTE — Assessment & Plan Note (Signed)
 Suboptimal on recent lipid panel from 03-05-2024 with LDL 138 and HDL 59, total cholesterol 846 and triglycerides 251.  This in the context of poorly controlled hemoglobin A1c.  Recommended to aggressively control her diabetes. Will recheck lipid panel at subsequent follow-up visit in 3 months and if still remains elevated, will further escalate rosuvastatin  dose.  Currently continues both on rosuvastatin  20 mg once daily and Repatha  140 mg subcutaneous injection every 14 days.

## 2024-05-26 NOTE — Assessment & Plan Note (Signed)
 Well-controlled on regimen. Target below 130/80 mmHg. Continue current medication valsartan , metoprolol  succinate.

## 2024-05-26 NOTE — Assessment & Plan Note (Signed)
 Recovered LVEF, last echocardiogram March 2024 with EF 60 to 65%.  Appears compensated euvolemic. Continue with furosemide  20 mg once daily. Recommended strongly to cut salt intake down to 2 g/day. Fluid restriction to 2 L/day.  Continue with guideline directed medical therapy currently on metoprolol  succinate 25 mg 3 times daily, recommended to take scheduled at the same time for combined dose of 75 mg a day. Continue Farxiga  10 mg once daily, cautioned about potential side effects of perineal infections and urinary tract infections. She denies any recent urinary tract infections or groin infections.  Uses nystatin  for groin folds to avoid moistness. Continue valsartan  80 mg twice daily. Hold off on adding spironolactone at this time given compensated heart failure and recovered LVEF.  Aaron Aas

## 2024-05-27 ENCOUNTER — Ambulatory Visit (INDEPENDENT_AMBULATORY_CARE_PROVIDER_SITE_OTHER): Admitting: Podiatry

## 2024-05-27 ENCOUNTER — Ambulatory Visit (INDEPENDENT_AMBULATORY_CARE_PROVIDER_SITE_OTHER)

## 2024-05-27 ENCOUNTER — Other Ambulatory Visit (HOSPITAL_BASED_OUTPATIENT_CLINIC_OR_DEPARTMENT_OTHER): Payer: Self-pay

## 2024-05-27 ENCOUNTER — Ambulatory Visit: Admitting: Podiatry

## 2024-05-27 DIAGNOSIS — M778 Other enthesopathies, not elsewhere classified: Secondary | ICD-10-CM

## 2024-05-27 DIAGNOSIS — R234 Changes in skin texture: Secondary | ICD-10-CM | POA: Diagnosis not present

## 2024-05-27 DIAGNOSIS — L03115 Cellulitis of right lower limb: Secondary | ICD-10-CM

## 2024-05-27 DIAGNOSIS — L089 Local infection of the skin and subcutaneous tissue, unspecified: Secondary | ICD-10-CM

## 2024-05-27 DIAGNOSIS — S92354A Nondisplaced fracture of fifth metatarsal bone, right foot, initial encounter for closed fracture: Secondary | ICD-10-CM

## 2024-05-27 DIAGNOSIS — E11628 Type 2 diabetes mellitus with other skin complications: Secondary | ICD-10-CM

## 2024-05-27 MED ORDER — SULFAMETHOXAZOLE-TRIMETHOPRIM 800-160 MG PO TABS
1.0000 | ORAL_TABLET | Freq: Two times a day (BID) | ORAL | 0 refills | Status: DC
  Filled 2024-05-27: qty 20, 10d supply, fill #0

## 2024-05-27 NOTE — Progress Notes (Signed)
 Chief Complaint  Patient presents with   Foot Pain    Urgent work in for right foot pain, redness and swelling. The redness is around the back of the heel and fevered. Turned red Sunday. She did go to UC, on Sunday Last A1c was 9.0 inMarch takes plavix  and ASA   HPI: 63 y.o. female presents today with concern of an infection to her right heel.  This is Dr. Luster Salters patient but the patient was unable to get an appointment with him this week.  She did notice a small open area on the bottom of her right heel.  She did not have any recent broken glass and does not recall stepping on anything at home.  She notes it is very tender.  She was placed on doxycycline  by urgent care on 05/24/2024.  She does have a history of MRSA.  She is also starting to have pain along the outside of the right foot.  She feels like something has been moving in that area for the past couple of days.  Denies bruising to that area.  Her diabetes is managed with both oral hypoglycemic medication and insulin   She uses a walker with wheels to assist with ambulation.  Past Medical History:  Diagnosis Date   Abnormal EKG 10/23/2021   Abnormal mammogram 08/09/2023   Abnormal mammogram of left breast 09/04/2021   Abnormal stress test 10/23/2021   Abscess of left elbow 02/18/2024   Absolute anemia 09/20/2022   Acquired varus deformity of foot, left    Acute chest pain 05/08/2016   Acute hemorrhagic cystitis 08/09/2023   AICD (automatic cardioverter/defibrillator) present    St Jude/Abbott device   AKI (acute kidney injury) (HCC) 10/23/2021   Amputation of right great toe (HCC) 09/20/2022   Angina pectoris (HCC) 09/11/2017   Anxiety 10/23/2021   B12 deficiency 09/20/2022   Bacteremia 11/22/2021   Breast wound, right, subsequent encounter 09/04/2021   Burping 05/08/2016   Cancer (HCC)    cervical - hysterectomy   Cellulitis of right breast 10/23/2021   Cellulitis, wound, post-operative 10/09/2021   Chronic diastolic  heart failure (HCC) 16/09/9603   Chronic osteomyelitis of left foot (HCC) 10/16/2023   Colon cancer screening 09/20/2022   Coronary artery disease involving native coronary artery of native heart with angina pectoris (HCC) 09/12/2015   Overview:  PCI and stent of RCA 2009, last cath 2012 with medical therapy  CABG May 2017   Demand ischemia [facility])    Diabetes mellitus due to underlying condition with hyperglycemia, with long-term current use of insulin  (HCC) 09/20/2022   Diabetic foot infection (HCC) 11/21/2021   Diabetic foot ulcer (HCC) 02/16/2020   Emphysema lung (HCC) 09/11/2017   patient not aware of this dx   Encounter for preoperative assessment 10/16/2023   Essential hypertension 09/12/2015   Gangrene (HCC) 10/23/2021   GERD (gastroesophageal reflux disease) 05/08/2016   Hematoma 10/23/2021   Hiatal hernia 10/23/2021   History of methicillin resistant staphylococcus aureus (MRSA)    Hyperglycemia 10/23/2021   Hyperglycemia due to type 2 diabetes mellitus (HCC) 10/23/2021   Hyperkalemia    Hyperlipidemia 09/12/2015   Hyponatremia 10/23/2021   ICD (implantable cardioverter-defibrillator) in place 05/16/2021   Infection of great toe 10/23/2021   Intertrigo 09/20/2022   Lactic acidosis 10/23/2021   Leukocytosis 10/23/2021   Mediastinitis 06/28/2016   Morbid obesity (HCC) 05/08/2016   Myocardial infarction [facility])    Needs flu shot 09/20/2022   NSTEMI (non-ST elevated myocardial infarction) (HCC) 05/22/2020  Obesity (BMI 30-39.9) 10/23/2021   Partial nontraumatic amputation of left foot (HCC) 09/20/2022   Perineal abscess 10/23/2021   Peripheral vascular disease (HCC)    S/P CABG (coronary artery bypass graft) 06/03/2016   Overview:  The patient underwent sternal reconstruction on 06/21/16 with pec flaps for mediastinitis from a prior CABG in May 2017. On admission, she was critically ill from sepsis and had altered mental status. She was last seen in clinic on 08/09/16 at which  time she was doing well.   Severe sepsis (HCC) 06/03/2016   Sinus tachycardia 05/08/2016   Tobacco use disorder 04/20/2016   Overview:  Quit in May 2017.   Type 2 diabetes mellitus with foot ulcer (HCC) 05/08/2016   Uncontrolled type 2 diabetes mellitus with hyperglycemia (HCC) 02/18/2024   Unstable angina (HCC) 05/22/2020   Ventricular tachycardia (HCC) 12/17/2020   Vitamin D  deficiency 09/20/2022   Wound, surgical, infected 06/06/2016   Overview:  sternal   Past Surgical History:  Procedure Laterality Date   ABDOMINAL AORTOGRAM W/LOWER EXTREMITY N/A 10/11/2021   Procedure: ABDOMINAL AORTOGRAM W/LOWER EXTREMITY;  Surgeon: Young Hensen, MD;  Location: [facility];  Service: Cardiovascular;  Laterality: N/A;   AMPUTATION TOE Right 02/20/2020   Procedure: AMPUTATION RIGHT GREAT  TOE;  Surgeon: Velma Ghazi, DPM;  Location: [facility] OR;  Service: Podiatry;  Laterality: Right;   BLADDER SURGERY     BONE BIOPSY Left 02/01/2022   Procedure: BONE BIOPSY LEFT FOOT, IRRIGATION AND DEBRIDEMENT;  Surgeon: Lizzie Riis, DPM;  Location: [facility] OR;  Service: Podiatry;  Laterality: Left;   BONE BIOPSY Left 10/31/2023   Procedure: BONE BIOPSY;  Surgeon: Dot Gazella, DPM;  Location: [facility] ORS;  Service: Orthopedics/Podiatry;  Laterality: Left;   BUBBLE STUDY  11/27/2021   Procedure: BUBBLE STUDY;  Surgeon: Elmyra Haggard, MD;  Location: [facility] ENDOSCOPY;  Service: Cardiovascular;;   CARDIAC CATHETERIZATION     CORONARY ARTERY BYPASS GRAFT     CORONARY STENT INTERVENTION N/A 05/25/2020   Procedure: CORONARY STENT INTERVENTION;  Surgeon: Wenona Hamilton, MD;  Location: [facility];  Service: Cardiovascular;  Laterality: N/A;   CORONARY STENT INTERVENTION N/A 06/22/2020   Procedure: CORONARY STENT INTERVENTION;  Surgeon: Arleen Lacer, MD;  Location: [facility] INVASIVE CV LAB;  Service: Cardiovascular;  Laterality: N/A;   CORONARY ULTRASOUND/IVUS N/A 06/22/2020   Procedure: Intravascular  Ultrasound/IVUS;  Surgeon: Arleen Lacer, MD;  Location: [facility] INVASIVE CV LAB;  Service: Cardiovascular;  Laterality: N/A;   ICD IMPLANT N/A 12/19/2020   Procedure: ICD IMPLANT;  Surgeon: Tammie Fall, MD;  Location: [facility] INVASIVE CV LAB;  Service: Cardiovascular;  Laterality: N/A;   INCISION AND DRAINAGE Right 02/19/2020   Procedure: INCISION AND DRAINAGE;  Surgeon: Charity Conch, DPM;  Location: [facility] OR;  Service: Podiatry;  Laterality: Right;  Block done by surgeon   INCISION AND DRAINAGE OF WOUND Left 10/31/2023   Procedure: IRRIGATION AND DEBRIDEMENT WOUND;  Surgeon: Dot Gazella, DPM;  Location: [facility]S;  Service: Orthopedics/Podiatry;  Laterality: Left;   IRRIGATION AND DEBRIDEMENT FOOT Left 10/10/2021   Procedure: IRRIGATION AND DEBRIDEMENT FOOT;  Surgeon: Lizzie Riis, DPM;  Location: [facility];  Service: Podiatry;  Laterality: Left;   IRRIGATION AND DEBRIDEMENT FOOT Left 11/23/2021   Procedure: IRRIGATION AND DEBRIDEMENT FOOT;  Surgeon: Jennefer Moats, MD;  Location: [facility];  Service: Podiatry;  Laterality: Left;  will do local block   LEFT HEART CATH AND CORS/GRAFTS ANGIOGRAPHY N/A 05/23/2020  Procedure: LEFT HEART CATH AND CORS/GRAFTS ANGIOGRAPHY;  Surgeon: Sammy Crisp, MD;  Location: [facility];  Service: Cardiovascular;  Laterality: N/A;   LEFT HEART CATH AND CORS/GRAFTS ANGIOGRAPHY N/A 12/19/2020   Procedure: LEFT HEART CATH AND CORS/GRAFTS ANGIOGRAPHY;  Surgeon: Odie Benne, MD;  Location: [facility];  Service: Cardiovascular;  Laterality: N/A;   LEFT HEART CATH AND CORS/GRAFTS ANGIOGRAPHY N/A 07/21/2021   Procedure: LEFT HEART CATH AND CORS/GRAFTS ANGIOGRAPHY;  Surgeon: Sammy Crisp, MD;  Location: [facility];  Service: Cardiovascular;  Laterality: N/A;   METATARSAL HEAD EXCISION Right 05/02/2020   Procedure: FIRST METATARSAL HEAD RESECTION; RIGHT FOOT WOUND CLOSURE;  Surgeon: Lizzie Riis, DPM;  Location: [facility];   Service: Podiatry;  Laterality: Right;  MAC W/LOCAL   PERIPHERAL VASCULAR BALLOON ANGIOPLASTY Left 10/11/2021   Procedure: PERIPHERAL VASCULAR BALLOON ANGIOPLASTY;  Surgeon: Young Hensen, MD;  Location: [facility];  Service: Cardiovascular;  Laterality: Left;  Anterior Tibial Artery   TEE WITHOUT CARDIOVERSION N/A 11/27/2021   Procedure: TRANSESOPHAGEAL ECHOCARDIOGRAM (TEE);  Surgeon: Elmyra Haggard, MD;  Location: [facility] ENDOSCOPY;  Service: Cardiovascular;  Laterality: N/A;   TIBIALIS TENDON TRANSFER / REPAIR Left 12/06/2023   Procedure: TIBIALIS TENDON TRANSFER;  Surgeon: Dot Gazella, DPM;  Location: [facility] ORS;  Service: Orthopedics/Podiatry;  Laterality: Left;   TRANSMETATARSAL AMPUTATION Left 11/23/2021   Procedure: TRANSMETATARSAL AMPUTATION;  Surgeon: Jennefer Moats, MD;  Location[facility] OR;  Service: Podiatry;  Laterality: Left;   TUBAL LIGATION     Allergies  Allergen Reactions   Vancomycin  Anaphylaxis   Chlorhexidine  Gluconate Itching    Received CHG bath, began itching, required benadryl    Cat Dander Other (See Comments)    Sneezing, watery eyes.   Other Itching    Dial  Soap   Tramadol  Other (See Comments)    Hallucinations   Codeine Rash   Review of Systems  Musculoskeletal:        Pain to right heel and lateral right midfoot  Skin:        Erythema and edema right heel     Physical Exam: 1/4 palpable pedal pulses right foot.  There is localized edema to the right foot and heel.  There is a small fissure measuring approximately 5 mm in length, on the plantar aspect of the right heel with significant pain on palpation of the area.  The heel has calor and rubor.  There is also pain on palpation to the lateral aspect of the right foot near the fifth metatarsal.  This is reproducible.  Antalgic gait secondary to pain and generalized difficulty in walking.  Decreased epicritic sensation to the foot.  Radiographic Exam (right foot, 3 partial weightbearing views,  05/27/2024):  Normal osseous mineralization.  Previous hallux amputation noted.  There is a spiral fracture of the right fifth metatarsal along the middle one third aspect.  There is very minimal dorsal displacement of the distal fragment.  There is approximately 2 mm gap of the fracture fragments.  It is in good position.  There is a small area that is slightly more radiodense than the remainder of the fat pad in the heel.  This was marked on x-ray on the lateral view.  This could be possible foreign body or small localized abscess.  It appears close to the area of the break in the skin on the plantar heel.       Assessment/Plan of Care: 1. Cellulitis of right foot  2. Nondisplaced fracture of fifth metatarsal bone, right foot, initial encounter for closed fracture   3. Fissure in skin of right foot   4. Diabetic foot infection (HCC)      Meds ordered this encounter  Medications   sulfamethoxazole -trimethoprim  (BACTRIM  DS) 800-160 MG tablet    Sig: Take 1 tablet by mouth 2 (two) times daily.    Dispense:  20 tablet    Refill:  0   Since there does not seem to be any improvement in the photos from a few days of doxycycline , we will have her discontinue the doxycycline  and start with Bactrim  DS 1 tablet twice daily x 10 days.  If this does need continued longer than this, she should get blood work performed to evaluate potassium level and also CBC, sed rate, CRP.  An attempt was made to gently debride and probe the plantar heel fissure.  The patient did not tolerate this well.  Did not observe any obvious foreign body at the skin level or just below the skin.  If she does not have continued improvement this area may need to be anesthetized and explored further.  I did not recommend an injection into the infected area today.  I did recommend the patient stay off the foot is much as possible and keep the foot elevated above the level of her heart.  Antibiotic ointment and a gauze plus Band-Aid  were applied to the heel.  She can use a drawing salve if she has this at home.  An attempt was made to fit the patient for a pneumatic cam walker.  Due to her difficulty with gait, she did not tolerate this well at all and this obviously put her at an increased fall risk.  The pneumatic cam walker would have been better for her with regard to the fifth metatarsal fracture, but we will have to place her in a surgical shoe which she tolerated better and will utilize her walker to assist with ambulation at all times.  The shoe will be worn at all times weightbearing.  Will have her follow-up with one of our surgeons with on-call privileges at the hospital.  She may eventually need to proceed with I&D if this does not improve over the next few days.  The area of cellulitis was marked with a purple marker today.  She was informed if the redness extends beyond the marked area, she needs to go to the emergency room.  If it recedes then she will keep her appointment at our office.  If she develops any signs of systemic infection including fever, chills, night sweats, nausea/vomiting, she needs to proceed to the emergency department immediately.  Follow-up in 4 to 5 days.   Joe Murders, DPM, FACFAS [facility]     [address] [place], [place] [address]                Office [phone]  Fax [phone]

## 2024-05-29 ENCOUNTER — Ambulatory Visit: Admitting: Podiatry

## 2024-06-01 ENCOUNTER — Other Ambulatory Visit (HOSPITAL_BASED_OUTPATIENT_CLINIC_OR_DEPARTMENT_OTHER): Payer: Self-pay

## 2024-06-01 ENCOUNTER — Ambulatory Visit (INDEPENDENT_AMBULATORY_CARE_PROVIDER_SITE_OTHER): Admitting: Podiatry

## 2024-06-01 ENCOUNTER — Encounter: Payer: Self-pay | Admitting: Podiatry

## 2024-06-01 ENCOUNTER — Ambulatory Visit (INDEPENDENT_AMBULATORY_CARE_PROVIDER_SITE_OTHER)

## 2024-06-01 DIAGNOSIS — S92354A Nondisplaced fracture of fifth metatarsal bone, right foot, initial encounter for closed fracture: Secondary | ICD-10-CM

## 2024-06-01 DIAGNOSIS — S90851A Superficial foreign body, right foot, initial encounter: Secondary | ICD-10-CM | POA: Diagnosis not present

## 2024-06-01 DIAGNOSIS — S92354D Nondisplaced fracture of fifth metatarsal bone, right foot, subsequent encounter for fracture with routine healing: Secondary | ICD-10-CM

## 2024-06-01 DIAGNOSIS — L03115 Cellulitis of right lower limb: Secondary | ICD-10-CM | POA: Diagnosis not present

## 2024-06-01 MED ORDER — OXYCODONE HCL 5 MG PO TABS
5.0000 mg | ORAL_TABLET | Freq: Four times a day (QID) | ORAL | 0 refills | Status: DC | PRN
  Filled 2024-06-01: qty 10, 3d supply, fill #0

## 2024-06-01 MED ORDER — SULFAMETHOXAZOLE-TRIMETHOPRIM 800-160 MG PO TABS
1.0000 | ORAL_TABLET | Freq: Two times a day (BID) | ORAL | 0 refills | Status: DC
  Filled 2024-06-01 – 2024-06-02 (×2): qty 20, 10d supply, fill #0

## 2024-06-01 NOTE — Progress Notes (Unsigned)
 Chief Complaint  Patient presents with   Fracture    Fracture and cellulitis follow up, 1 week. The redness on the right heel is looking better in some areas but not in others. I cannot tell if it is moving over the line around the bottom. Xrays up. The fracture on the right foot area is puffy and bruised today. She was in post op shoe.    HPI: 63 y.o. female presents today with concern of an infection to her right heel. This is a Dr. Luster Salters patient who initially presented to Dr. Estle Hemp last week for this issue.  She continues to endorse pain with ambulation to the right heel.  Erythema has spread slightly to the distal parts of the foot but does not appear to have ascended.  She has been on Bactrim .  She also has sustained right fifth metatarsal fracture.  She has been unable to maintain nonweightbearing.  She was placed in a postop shoe due to concern for balance issues using a cam boot.  She uses a walker with wheels to assist with ambulation.  Past Medical History:  Diagnosis Date   Abnormal EKG 10/23/2021   Abnormal mammogram 08/09/2023   Abnormal mammogram of left breast 09/04/2021   Abnormal stress test 10/23/2021   Abscess of left elbow 02/18/2024   Absolute anemia 09/20/2022   Acquired varus deformity of foot, left    Acute chest pain 05/08/2016   Acute hemorrhagic cystitis 08/09/2023   AICD (automatic cardioverter/defibrillator) present    St Jude/Abbott device   AKI (acute kidney injury) (HCC) 10/23/2021   Amputation of right great toe (HCC) 09/20/2022   Angina pectoris (HCC) 09/11/2017   Anxiety 10/23/2021   B12 deficiency 09/20/2022   Bacteremia 11/22/2021   Breast wound, right, subsequent encounter 09/04/2021   Burping 05/08/2016   Cancer (HCC)    cervical - hysterectomy   Cellulitis of right breast 10/23/2021   Cellulitis, wound, post-operative 10/09/2021   Chronic diastolic heart failure (HCC) 11/06/2016   Chronic osteomyelitis of left foot (HCC) 10/16/2023    Colon cancer screening 09/20/2022   Coronary artery disease involving native coronary artery of native heart with angina pectoris (HCC) 09/12/2015   Overview:  PCI and stent of RCA 2009, last cath 2012 with medical therapy  CABG May 2017   Demand ischemia [facility])    Diabetes mellitus due to underlying condition with hyperglycemia, with long-term current use of insulin  (HCC) 09/20/2022   Diabetic foot infection (HCC) 11/21/2021   Diabetic foot ulcer (HCC) 02/16/2020   Emphysema lung (HCC) 09/11/2017   patient not aware of this dx   Encounter for preoperative assessment 10/16/2023   Essential hypertension 09/12/2015   Gangrene (HCC) 10/23/2021   GERD (gastroesophageal reflux disease) 05/08/2016   Hematoma 10/23/2021   Hiatal hernia 10/23/2021   History of methicillin resistant staphylococcus aureus (MRSA)    Hyperglycemia 10/23/2021   Hyperglycemia due to type 2 diabetes mellitus (HCC) 10/23/2021   Hyperkalemia    Hyperlipidemia 09/12/2015   Hyponatremia 10/23/2021   ICD (implantable cardioverter-defibrillator) in place 05/16/2021   Infection of great toe 10/23/2021   Intertrigo 09/20/2022   Lactic acidosis 10/23/2021   Leukocytosis 10/23/2021   Mediastinitis 06/28/2016   Morbid obesity (HCC) 05/08/2016   Myocardial infarction (HCC)    Needs flu shot 09/20/2022   NSTEMI (non-ST elevated myocardial infarction) (HCC) 05/22/2020   Obesity (BMI 30-39.9) 10/23/2021   Partial nontraumatic amputation of left foot (HCC) 09/20/2022   Perineal abscess 10/23/2021  Peripheral vascular disease (HCC)    S/P CABG (coronary artery bypass graft) 06/03/2016   Overview:  The patient underwent sternal reconstruction on 06/21/16 with pec flaps for mediastinitis from a prior CABG in May 2017. On admission, she was critically ill from sepsis and had altered mental status. She was last seen in clinic on 08/09/16 at which time she was doing well.   Severe sepsis (HCC) 06/03/2016   Sinus tachycardia  05/08/2016   Tobacco use disorder 04/20/2016   Overview:  Quit in May 2017.   Type 2 diabetes mellitus with foot ulcer (HCC) 05/08/2016   Uncontrolled type 2 diabetes mellitus with hyperglycemia (HCC) 02/18/2024   Unstable angina (HCC) 05/22/2020   Ventricular tachycardia (HCC) 12/17/2020   Vitamin D  deficiency 09/20/2022   Wound, surgical, infected 06/06/2016   Overview:  sternal   Past Surgical History:  Procedure Laterality Date   ABDOMINAL AORTOGRAM W/LOWER EXTREMITY N/A 10/11/2021   Procedure: ABDOMINAL AORTOGRAM W/LOWER EXTREMITY;  Surgeon: Young Hensen, MD;  Location: [facility];  Service: Cardiovascular;  Laterality: N/A;   AMPUTATION TOE Right 02/20/2020   Procedure: AMPUTATION RIGHT GREAT  TOE;  Surgeon: Velma Ghazi, DPM;  Location: [facility] OR;  Service: Podiatry;  Laterality: Right;   BLADDER SURGERY     BONE BIOPSY Left 02/01/2022   Procedure: BONE BIOPSY LEFT FOOT, IRRIGATION AND DEBRIDEMENT;  Surgeon: Lizzie Riis, DPM;  Location: [facility] OR;  Service: Podiatry;  Laterality: Left;   BONE BIOPSY Left 10/31/2023   Procedure: BONE BIOPSY;  Surgeon: Dot Gazella, DPM;  Location: [facility];  Service: Orthopedics/Podiatry;  Laterality: Left;   BUBBLE STUDY  11/27/2021   Procedure: BUBBLE STUDY;  Surgeon: Elmyra Haggard, MD;  Location: [facility] ENDOSCOPY;  Service: Cardiovascular;;   CARDIAC CATHETERIZATION     CORONARY ARTERY BYPASS GRAFT     CORONARY STENT INTERVENTION N/A 05/25/2020   Procedure: CORONARY STENT INTERVENTION;  Surgeon: Wenona Hamilton, MD;  Location: [facility];  Service: Cardiovascular;  Laterality: N/A;   CORONARY STENT INTERVENTION N/A 06/22/2020   Procedure: CORONARY STENT INTERVENTION;  Surgeon: Arleen Lacer, MD;  Location: [facility] INVASIVE CV LAB;  Service: Cardiovascular;  Laterality: N/A;   CORONARY ULTRASOUND/IVUS N/A 06/22/2020   Procedure: Intravascular Ultrasound/IVUS;  Surgeon: Arleen Lacer, MD;  Location: [facility] INVASIVE CV LAB;  Service:  Cardiovascular;  Laterality: N/A;   ICD IMPLANT N/A 12/19/2020   Procedure: ICD IMPLANT;  Surgeon: Tammie Fall, MD;  Location: [facility] INVASIVE CV LAB;  Service: Cardiovascular;  Laterality: N/A;   INCISION AND DRAINAGE Right 02/19/2020   Procedure: INCISION AND DRAINAGE;  Surgeon: Charity Conch, DPM;  Location: [facility] OR;  Service: Podiatry;  Laterality: Right;  Block done by surgeon   INCISION AND DRAINAGE OF WOUND Left 10/31/2023   Procedure: IRRIGATION AND DEBRIDEMENT WOUND;  Surgeon: Dot Gazella, DPM;  Location: [facility];  Service: Orthopedics/Podiatry;  Laterality: Left;   IRRIGATION AND DEBRIDEMENT FOOT Left 10/10/2021   Procedure: IRRIGATION AND DEBRIDEMENT FOOT;  Surgeon: Lizzie Riis, DPM;  Location: [facility];  Service: Podiatry;  Laterality: Left;   IRRIGATION AND DEBRIDEMENT FOOT Left 11/23/2021   Procedure: IRRIGATION AND DEBRIDEMENT FOOT;  Surgeon: Jennefer Moats, MD;  Location: [facility];  Service: Podiatry;  Laterality: Left;  will do local block   LEFT HEART CATH AND CORS/GRAFTS ANGIOGRAPHY N/A 05/23/2020   Procedure: LEFT HEART CATH AND CORS/GRAFTS ANGIOGRAPHY;  Surgeon: Sammy Crisp, MD;  Location: [facility];  Service:  Cardiovascular;  Laterality: N/A;   LEFT HEART CATH AND CORS/GRAFTS ANGIOGRAPHY N/A 12/19/2020   Procedure: LEFT HEART CATH AND CORS/GRAFTS ANGIOGRAPHY;  Surgeon: Odie Benne, MD;  Location: [facility];  Service: Cardiovascular;  Laterality: N/A;   LEFT HEART CATH AND CORS/GRAFTS ANGIOGRAPHY N/A 07/21/2021   Procedure: LEFT HEART CATH AND CORS/GRAFTS ANGIOGRAPHY;  Surgeon: Sammy Crisp, MD;  Location: [facility];  Service: Cardiovascular;  Laterality: N/A;   METATARSAL HEAD EXCISION Right 05/02/2020   Procedure: FIRST METATARSAL HEAD RESECTION; RIGHT FOOT WOUND CLOSURE;  Surgeon: Lizzie Riis, DPM;  Location: [facility];  Service: Podiatry;  Laterality: Right;  MAC W/LOCAL   PERIPHERAL VASCULAR BALLOON ANGIOPLASTY  Left 10/11/2021   Procedure: PERIPHERAL VASCULAR BALLOON ANGIOPLASTY;  Surgeon: Young Hensen, MD;  Location: [facility];  Service: Cardiovascular;  Laterality: Left;  Anterior Tibial Artery   TEE WITHOUT CARDIOVERSION N/A 11/27/2021   Procedure: TRANSESOPHAGEAL ECHOCARDIOGRAM (TEE);  Surgeon: Elmyra Haggard, MD;  Location: [facility] ENDOSCOPY;  Service: Cardiovascular;  Laterality: N/A;   TIBIALIS TENDON TRANSFER / REPAIR Left 12/06/2023   Procedure: TIBIALIS TENDON TRANSFER;  Surgeon: Dot Gazella, DPM;  Location: [facility] ORS;  Service: Orthopedics/Podiatry;  Laterality: Left;   TRANSMETATARSAL AMPUTATION Left 11/23/2021   Procedure: TRANSMETATARSAL AMPUTATION;  Surgeon: Jennefer Moats, MD;  Location: [facility] OR;  Service: Podiatry;  Laterality: Left;   TUBAL LIGATION     Allergies  Allergen Reactions   Vancomycin  Anaphylaxis   Chlorhexidine  Gluconate Itching    Received CHG bath, began itching, required benadryl    Cat Dander Other (See Comments)    Sneezing, watery eyes.   Other Itching    Dial  Soap   Tramadol  Other (See Comments)    Hallucinations   Codeine Rash   Review of Systems  Musculoskeletal:        Pain to right heel and lateral right midfoot  Skin:        Erythema and edema right heel     Physical Exam: 1/4 palpable pedal pulses right foot.  There is localized edema to the right foot and heel.  This area is significantly painful.  See procedure note for full findings below.  The heel has calor and rubor.  There was noted to be these of glass within the patient's heel about half a centimeter in length, scant purulence around this area.  Erythema has extended somewhat distally past the established margins by 1 to 2 cm.  It has not ascended proximally.  There is also pain on palpation to the lateral aspect of the right foot near the fifth metatarsal.  This is reproducible.  Antalgic gait secondary to pain and generalized difficulty in walking.  Decreased epicritic sensation  to the foot.  Radiographic Exam (right foot, 3 partial weightbearing views, 06/01/2024):  Normal osseous mineralization.  Previous hallux amputation noted.  Small area of radiodense object seen within plantar soft tissues of the heel.  Correlates with foreign body as discussed below.  Some vascular calcifications noted.  There is some displacement of the previously noted fifth metatarsal spiral oblique diaphyseal fracture, it is starting to slightly elevate the sagittal plane.  Alignment does appear maintained in the AP view.    Assessment/Plan of Care: 1. Nondisplaced fracture of fifth metatarsal bone, right foot, subsequent encounter for fracture with routine healing   2. Cellulitis of right foot   3. Foreign body of skin of plantar aspect of right foot      Meds  ordered this encounter  Medications   oxyCODONE  (ROXICODONE ) 5 MG immediate release tablet    Sig: Take 1 tablet (5 mg total) by mouth every 6 (six) hours as needed for up to 10 doses for severe pain (pain score 7-10).    Dispense:  10 tablet    Refill:  0   sulfamethoxazole -trimethoprim  (BACTRIM  DS) 800-160 MG tablet    Sig: Take 1 tablet by mouth 2 (two) times daily.    Dispense:  20 tablet    Refill:  0   # Right heel cellulitis # Right heel foreign body - Radiographs reviewed with patient.  Strongly suspect that there may be foreign body at the site of scab plantarly correlating with the patient's pain. - Verbal consent was obtained to attempt foreign body removal.  See procedure note below.  Foreign body was removed appeared to be piece of glass - Betadine  wet-to-dry dressing applied today.  Recommend daily dressing changes.  # Right heel cellulitis - Extended course of p.o. Bactrim . - Short course of oxycodone  sent in due to extent of patient's pain - Ordering CBC, sed rate, CRP - Close monitoring of the cellulitis.  Patient needs to go to the hospital if this does not improve or worsens. -I certify that this  diagnosis represents a distinct and separate diagnosis that requires evaluation and treatment separate from other procedures or diagnosis  # Right fifth metatarsal fracture - Radiographs reviewed with patient - There is evidence of very mild dorsal displacement of the fracture fragment - Emphasized need for limited stance and gait.  I suggested that they consider getting a wheelchair as the patient has TMA to the left side and has difficulty maintaining balance - May need to revisit cam boot - Will continue to monitor fracture alignment. Do not think ORIF at this time is appropriate currently given that there is active infection of the right foot. - Planning on getting patient back into see Dr. Luster Salters, will continue to follow her for her infection in the interim.  Follow-up in 1 week to assess progression of the cellulitis  Procedure: Right heel foreign body removal - Anesthesia: Injected 3 cc using the block technique of a one-to-one ratio of 2% lidocaine  plain 0.5% Marcaine  plain - Betadine  skin prep - Debrided overlying scab using a 15 blade.  Encountered piece of foreign body material which was removed using pickups without incident - Scant area of purulence noted with removal of the foreign body.  No other purulent drainage could be expressed. - Area irrigated with saline flush - Betadine  wet-to-dry gauze dressing applied.  Will allow area to heal with secondary intention.  Foreign body was within subcutaneous tissue however this area is pinpoint, less than 0.4 cm in diameter.   Dameka Younker L. Lunda Salines, AACFAS [facility]     [address] [place], [place] [address]                Office [phone]  Fax [phone]

## 2024-06-02 ENCOUNTER — Other Ambulatory Visit (HOSPITAL_BASED_OUTPATIENT_CLINIC_OR_DEPARTMENT_OTHER): Payer: Self-pay

## 2024-06-09 ENCOUNTER — Ambulatory Visit (INDEPENDENT_AMBULATORY_CARE_PROVIDER_SITE_OTHER): Admitting: Podiatry

## 2024-06-09 ENCOUNTER — Ambulatory Visit (INDEPENDENT_AMBULATORY_CARE_PROVIDER_SITE_OTHER)

## 2024-06-09 ENCOUNTER — Other Ambulatory Visit (HOSPITAL_BASED_OUTPATIENT_CLINIC_OR_DEPARTMENT_OTHER): Payer: Self-pay

## 2024-06-09 DIAGNOSIS — S92354D Nondisplaced fracture of fifth metatarsal bone, right foot, subsequent encounter for fracture with routine healing: Secondary | ICD-10-CM | POA: Diagnosis not present

## 2024-06-09 DIAGNOSIS — S90851A Superficial foreign body, right foot, initial encounter: Secondary | ICD-10-CM | POA: Diagnosis not present

## 2024-06-09 DIAGNOSIS — L03115 Cellulitis of right lower limb: Secondary | ICD-10-CM

## 2024-06-09 DIAGNOSIS — K5903 Drug induced constipation: Secondary | ICD-10-CM

## 2024-06-09 DIAGNOSIS — L03119 Cellulitis of unspecified part of limb: Secondary | ICD-10-CM | POA: Diagnosis not present

## 2024-06-09 DIAGNOSIS — L02619 Cutaneous abscess of unspecified foot: Secondary | ICD-10-CM | POA: Diagnosis not present

## 2024-06-09 MED ORDER — SENNOSIDES 8.6 MG PO TABS
1.0000 | ORAL_TABLET | Freq: Every day | ORAL | 0 refills | Status: AC
  Filled 2024-06-09: qty 10, 10d supply, fill #0

## 2024-06-09 MED ORDER — SULFAMETHOXAZOLE-TRIMETHOPRIM 800-160 MG PO TABS
1.0000 | ORAL_TABLET | Freq: Two times a day (BID) | ORAL | 0 refills | Status: DC
  Filled 2024-06-09: qty 20, 10d supply, fill #0

## 2024-06-09 NOTE — Progress Notes (Unsigned)
 Chief Complaint  Patient presents with   Cellulitis    Follow up for cellulitis and 5th met base FX of the right foot. The redness has receded a little bit, but the medial side is still red/pink and warm to the touch, with the heel feeling hard. The lateral side is the met FX  and is swollen and both are painful to touch. Also, she cannot take any more Narcs, she is severly constipated.    HPI: 63 y.o. female presents today following up for right heel foreign body removal.  She is accompanied by her husband.  They do feel that the redness has gone down some however the patient continues to endorse a lot of pain to the heel as well as to the lateral foot where there is fifth metatarsal fracture.  She has been on oral Bactrim .  Labs were ordered at the last visit which the patient did not obtain.  She uses a walker with wheels to assist with ambulation.  Past Medical History:  Diagnosis Date   Abnormal EKG 10/23/2021   Abnormal mammogram 08/09/2023   Abnormal mammogram of left breast 09/04/2021   Abnormal stress test 10/23/2021   Abscess of left elbow 02/18/2024   Absolute anemia 09/20/2022   Acquired varus deformity of foot, left    Acute chest pain 05/08/2016   Acute hemorrhagic cystitis 08/09/2023   AICD (automatic cardioverter/defibrillator) present    St Jude/Abbott device   AKI (acute kidney injury) (HCC) 10/23/2021   Amputation of right great toe (HCC) 09/20/2022   Angina pectoris (HCC) 09/11/2017   Anxiety 10/23/2021   B12 deficiency 09/20/2022   Bacteremia 11/22/2021   Breast wound, right, subsequent encounter 09/04/2021   Burping 05/08/2016   Cancer (HCC)    cervical - hysterectomy   Cellulitis of right breast 10/23/2021   Cellulitis, wound, post-operative 10/09/2021   Chronic diastolic heart failure (HCC) 11/06/2016   Chronic osteomyelitis of left foot (HCC) 10/16/2023   Colon cancer screening 09/20/2022   Coronary artery disease involving native coronary artery of  native heart with angina pectoris (HCC) 09/12/2015   Overview:  PCI and stent of RCA 2009, last cath 2012 with medical therapy  CABG May 2017   Demand ischemia [facility])    Diabetes mellitus due to underlying condition with hyperglycemia, with long-term current use of insulin  (HCC) 09/20/2022   Diabetic foot infection (HCC) 11/21/2021   Diabetic foot ulcer (HCC) 02/16/2020   Emphysema lung (HCC) 09/11/2017   patient not aware of this dx   Encounter for preoperative assessment 10/16/2023   Essential hypertension 09/12/2015   Gangrene (HCC) 10/23/2021   GERD (gastroesophageal reflux disease) 05/08/2016   Hematoma 10/23/2021   Hiatal hernia 10/23/2021   History of methicillin resistant staphylococcus aureus (MRSA)    Hyperglycemia 10/23/2021   Hyperglycemia due to type 2 diabetes mellitus (HCC) 10/23/2021   Hyperkalemia    Hyperlipidemia 09/12/2015   Hyponatremia 10/23/2021   ICD (implantable cardioverter-defibrillator) in place 05/16/2021   Infection of great toe 10/23/2021   Intertrigo 09/20/2022   Lactic acidosis 10/23/2021   Leukocytosis 10/23/2021   Mediastinitis 06/28/2016   Morbid obesity (HCC) 05/08/2016   Myocardial infarction (HCC)    Needs flu shot 09/20/2022   NSTEMI (non-ST elevated myocardial infarction) (HCC) 05/22/2020   Obesity (BMI 30-39.9) 10/23/2021   Partial nontraumatic amputation of left foot (HCC) 09/20/2022   Perineal abscess 10/23/2021   Peripheral vascular disease (HCC)    S/P CABG (coronary artery bypass graft) 06/03/2016  Overview:  The patient underwent sternal reconstruction on 06/21/16 with pec flaps for mediastinitis from a prior CABG in May 2017. On admission, she was critically ill from sepsis and had altered mental status. She was last seen in clinic on 08/09/16 at which time she was doing well.   Severe sepsis (HCC) 06/03/2016   Sinus tachycardia 05/08/2016   Tobacco use disorder 04/20/2016   Overview:  Quit in May 2017.   Type 2 diabetes mellitus  with foot ulcer (HCC) 05/08/2016   Uncontrolled type 2 diabetes mellitus with hyperglycemia (HCC) 02/18/2024   Unstable angina (HCC) 05/22/2020   Ventricular tachycardia (HCC) 12/17/2020   Vitamin D  deficiency 09/20/2022   Wound, surgical, infected 06/06/2016   Overview:  sternal   Past Surgical History:  Procedure Laterality Date   ABDOMINAL AORTOGRAM W/LOWER EXTREMITY N/A 10/11/2021   Procedure: ABDOMINAL AORTOGRAM W/LOWER EXTREMITY;  Surgeon: Gretta Lonni PARAS, MD;  Location: [facility];  Service: Cardiovascular;  Laterality: N/A;   AMPUTATION TOE Right 02/20/2020   Procedure: AMPUTATION RIGHT GREAT  TOE;  Surgeon: Tobie Franky SQUIBB, DPM;  Location: [facility] OR;  Service: Podiatry;  Laterality: Right;   BLADDER SURGERY     BONE BIOPSY Left 02/01/2022   Procedure: BONE BIOPSY LEFT FOOT, IRRIGATION AND DEBRIDEMENT;  Surgeon: Burt Fus, DPM;  Location: [facility] OR;  Service: Podiatry;  Laterality: Left;   BONE BIOPSY Left 10/31/2023   Procedure: BONE BIOPSY;  Surgeon: Janit Thresa HERO, DPM;  Location: [facility];  Service: Orthopedics/Podiatry;  Laterality: Left;   BUBBLE STUDY  11/27/2021   Procedure: BUBBLE STUDY;  Surgeon: Okey Vina GAILS, MD;  Location: [facility] ENDOSCOPY;  Service: Cardiovascular;;   CARDIAC CATHETERIZATION     CORONARY ARTERY BYPASS GRAFT     CORONARY STENT INTERVENTION N/A 05/25/2020   Procedure: CORONARY STENT INTERVENTION;  Surgeon: Darron Deatrice LABOR, MD;  Location: [facility];  Service: Cardiovascular;  Laterality: N/A;   CORONARY STENT INTERVENTION N/A 06/22/2020   Procedure: CORONARY STENT INTERVENTION;  Surgeon: Anner Alm ORN, MD;  Location: [facility] INVASIVE CV LAB;  Service: Cardiovascular;  Laterality: N/A;   CORONARY ULTRASOUND/IVUS N/A 06/22/2020   Procedure: Intravascular Ultrasound/IVUS;  Surgeon: Anner Alm ORN, MD;  Location: [facility] CV LAB;  Service: Cardiovascular;  Laterality: N/A;   ICD IMPLANT N/A 12/19/2020   Procedure: ICD IMPLANT;  Surgeon: Waddell Danelle ORN, MD;  Location: [facility] INVASIVE CV LAB;  Service: Cardiovascular;  Laterality: N/A;   INCISION AND DRAINAGE Right 02/19/2020   Procedure: INCISION AND DRAINAGE;  Surgeon: Gershon Donnice SAUNDERS, DPM;  Location: [facility];  Service: Podiatry;  Laterality: Right;  Block done by surgeon   INCISION AND DRAINAGE OF WOUND Left 10/31/2023   Procedure: IRRIGATION AND DEBRIDEMENT WOUND;  Surgeon: Janit Thresa HERO, DPM;  Location: [facility];  Service: Orthopedics/Podiatry;  Laterality: Left;   IRRIGATION AND DEBRIDEMENT FOOT Left 10/10/2021   Procedure: IRRIGATION AND DEBRIDEMENT FOOT;  Surgeon: Burt Fus, DPM;  Location: [facility];  Service: Podiatry;  Laterality: Left;   IRRIGATION AND DEBRIDEMENT FOOT Left 11/23/2021   Procedure: IRRIGATION AND DEBRIDEMENT FOOT;  Surgeon: Joya Stabs, MD;  Location: [facility];  Service: Podiatry;  Laterality: Left;  will do local block   LEFT HEART CATH AND CORS/GRAFTS ANGIOGRAPHY N/A 05/23/2020   Procedure: LEFT HEART CATH AND CORS/GRAFTS ANGIOGRAPHY;  Surgeon: Mady Lonni, MD;  Location: [facility];  Service: Cardiovascular;  Laterality: N/A;   LEFT HEART CATH AND CORS/GRAFTS ANGIOGRAPHY N/A 12/19/2020  Procedure: LEFT HEART CATH AND CORS/GRAFTS ANGIOGRAPHY;  Surgeon: Verlin Lonni BIRCH, MD;  Location: [facility];  Service: Cardiovascular;  Laterality: N/A;   LEFT HEART CATH AND CORS/GRAFTS ANGIOGRAPHY N/A 07/21/2021   Procedure: LEFT HEART CATH AND CORS/GRAFTS ANGIOGRAPHY;  Surgeon: Mady Lonni, MD;  Location: [facility];  Service: Cardiovascular;  Laterality: N/A;   METATARSAL HEAD EXCISION Right 05/02/2020   Procedure: FIRST METATARSAL HEAD RESECTION; RIGHT FOOT WOUND CLOSURE;  Surgeon: Burt Fus, DPM;  Location: [facility];  Service: Podiatry;  Laterality: Right;  MAC W/LOCAL   PERIPHERAL VASCULAR BALLOON ANGIOPLASTY Left 10/11/2021   Procedure: PERIPHERAL VASCULAR BALLOON ANGIOPLASTY;  Surgeon: Gretta Lonni PARAS, MD;   Location: [facility];  Service: Cardiovascular;  Laterality: Left;  Anterior Tibial Artery   TEE WITHOUT CARDIOVERSION N/A 11/27/2021   Procedure: TRANSESOPHAGEAL ECHOCARDIOGRAM (TEE);  Surgeon: Okey Vina GAILS, MD;  Location: [facility] ENDOSCOPY;  Service: Cardiovascular;  Laterality: N/A;   TIBIALIS TENDON TRANSFER / REPAIR Left 12/06/2023   Procedure: TIBIALIS TENDON TRANSFER;  Surgeon: Janit Thresa HERO, DPM;  Location: [facility] ORS;  Service: Orthopedics/Podiatry;  Laterality: Left;   TRANSMETATARSAL AMPUTATION Left 11/23/2021   Procedure: TRANSMETATARSAL AMPUTATION;  Surgeon: Joya Stabs, MD;  [facility] OR;  Service: Podiatry;  Laterality: Left;   TUBAL LIGATION     Allergies  Allergen Reactions   Vancomycin  Anaphylaxis   Chlorhexidine  Gluconate Itching    Received CHG bath, began itching, required benadryl    Cat Dander Other (See Comments)    Sneezing, watery eyes.   Other Itching    Dial  Soap   Tramadol  Other (See Comments)    Hallucinations   Codeine Rash   Review of Systems  Musculoskeletal:        Pain to right heel and lateral right midfoot  Skin:        Erythema and edema right heel  Denies any nausea, vomiting, fever, chills, chest pain, shortness of breath  Physical Exam: 1/4 palpable pedal pulses right foot.  Continued erythema and edema to the right heel.  The margins of the erythema do appear somewhat reduced however the affected area is still somewhat profound.  The heel has calor and rubor.  Heel is very tender on palpation at the site of foreign body removal and just posterior to this.  Foreign body removal site has callused over.  This was debrided and did have mild amount of purulent drainage.  Does probe deep close to 1 cm, area of opening 0.4 cm in diameter.  There is also pain on palpation to the lateral aspect of the right foot near the fifth metatarsal.  Antalgic gait secondary to pain and generalized difficulty in walking.  Decreased epicritic sensation to the  foot.  Radiographic Exam (right foot, 3 partial weightbearing views, 625/2025):  Normal osseous mineralization.  Previous hallux amputation noted.  No soft tissue emphysema.  No changes seen to the calcaneus from previous exam.  Some vascular calcifications noted.  There is some increased displacement of the previously noted fifth metatarsal spiral oblique diaphyseal fracture, it is starting to slightly elevate the sagittal plane.    Assessment/Plan of Care: 1. Cellulitis and abscess of foot, except toes   2. Nondisplaced fracture of fifth metatarsal bone, right foot, subsequent encounter for fracture with routine healing   3. Foreign body of skin of plantar aspect of right foot   4. Drug-induced constipation      Meds ordered this encounter  Medications  sulfamethoxazole -trimethoprim  (BACTRIM  DS) 800-160 MG tablet    Sig: Take 1 tablet by mouth 2 (two) times daily.    Dispense:  20 tablet    Refill:  0   senna (SENOKOT) 8.6 MG tablet    Sig: Take 1 tablet (8.6 mg total) by mouth daily for 10 days. May discontinue once symptoms resolve    Dispense:  10 tablet    Refill:  0   # Right heel cellulitis, concern for abscess -Simple I&D performed: Betadine  skin prep. Hyperkeratotic tissue to foreign body site was sharply excised using 312 scalpel blade and microcurette to level of subcutaneous tissue. Wound does probe deep about 1 cm, does undermine.  Purulent drainage noted.  Wound culture obtained. - Do have concern for residual abscess that could not be explored today.  Stressed importance that they obtain the lab work previously ordered including CBC, CRP, sed rate.  Will notify patient of any critical results - Irrigated site with saline, Betadine , Betadine  wet-to-dry dressing applied.  Change dressings daily - Extending course of oral Bactrim  pending updated culture results.  Instructed patient to go to the hospital of pain and signs of infection do not significantly improve in the next  2 to 3 days, or if lab results appear concerning.   # Right fifth metatarsal fracture - Radiographs reviewed with patient - Dorsal displacement of the fracture fragment somewhat worsening - Instructed the need for a wheelchair and nonweightbearing.  She cannot ambulate safely in a boot due to balance and left side TMA. - Did discuss ORIF, holding off at this point due to presence of infection. - Planning on getting patient back into see Dr. Janit, will continue to follow her for her infection in the interim.  Follow-up in 1 week to assess progression of the cellulitis  Prescribing Senokot due to patient reported constipation likely from short course of opioids prescribed from prior procedure.  Latavious Bitter L. Lamount MAUL, AACFAS [facility]     [address] [place], [place] [address]                Office [phone]  Fax [phone]

## 2024-06-10 ENCOUNTER — Encounter (HOSPITAL_COMMUNITY): Payer: Self-pay | Admitting: Internal Medicine

## 2024-06-10 ENCOUNTER — Other Ambulatory Visit: Payer: Self-pay

## 2024-06-10 ENCOUNTER — Ambulatory Visit: Admitting: Cardiology

## 2024-06-10 ENCOUNTER — Encounter: Payer: Self-pay | Admitting: Podiatry

## 2024-06-10 ENCOUNTER — Inpatient Hospital Stay (HOSPITAL_COMMUNITY)
Admission: EM | Admit: 2024-06-10 | Discharge: 2024-06-17 | DRG: 629 | Disposition: A | Discharge status: Home Health | Attending: Student | Admitting: Student

## 2024-06-10 DIAGNOSIS — S92351A Displaced fracture of fifth metatarsal bone, right foot, initial encounter for closed fracture: Secondary | ICD-10-CM | POA: Diagnosis present

## 2024-06-10 DIAGNOSIS — I472 Ventricular tachycardia, unspecified: Secondary | ICD-10-CM | POA: Diagnosis present

## 2024-06-10 DIAGNOSIS — M71071 Abscess of bursa, right ankle and foot: Secondary | ICD-10-CM

## 2024-06-10 DIAGNOSIS — X58XXXA Exposure to other specified factors, initial encounter: Secondary | ICD-10-CM | POA: Diagnosis present

## 2024-06-10 DIAGNOSIS — R591 Generalized enlarged lymph nodes: Secondary | ICD-10-CM | POA: Diagnosis present

## 2024-06-10 DIAGNOSIS — J449 Chronic obstructive pulmonary disease, unspecified: Secondary | ICD-10-CM | POA: Diagnosis not present

## 2024-06-10 DIAGNOSIS — L02611 Cutaneous abscess of right foot: Secondary | ICD-10-CM | POA: Diagnosis present

## 2024-06-10 DIAGNOSIS — Z7902 Long term (current) use of antithrombotics/antiplatelets: Secondary | ICD-10-CM

## 2024-06-10 DIAGNOSIS — E1169 Type 2 diabetes mellitus with other specified complication: Principal | ICD-10-CM | POA: Diagnosis present

## 2024-06-10 DIAGNOSIS — T8141XA Infection following a procedure, superficial incisional surgical site, initial encounter: Secondary | ICD-10-CM | POA: Diagnosis not present

## 2024-06-10 DIAGNOSIS — W25XXXA Contact with sharp glass, initial encounter: Secondary | ICD-10-CM | POA: Diagnosis present

## 2024-06-10 DIAGNOSIS — Z7984 Long term (current) use of oral hypoglycemic drugs: Secondary | ICD-10-CM | POA: Diagnosis not present

## 2024-06-10 DIAGNOSIS — B951 Streptococcus, group B, as the cause of diseases classified elsewhere: Secondary | ICD-10-CM | POA: Diagnosis not present

## 2024-06-10 DIAGNOSIS — Z794 Long term (current) use of insulin: Secondary | ICD-10-CM | POA: Diagnosis not present

## 2024-06-10 DIAGNOSIS — Z95810 Presence of automatic (implantable) cardiac defibrillator: Secondary | ICD-10-CM

## 2024-06-10 DIAGNOSIS — Z79899 Other long term (current) drug therapy: Secondary | ICD-10-CM

## 2024-06-10 DIAGNOSIS — J439 Emphysema, unspecified: Secondary | ICD-10-CM | POA: Diagnosis not present

## 2024-06-10 DIAGNOSIS — I2489 Other forms of acute ischemic heart disease: Secondary | ICD-10-CM | POA: Diagnosis present

## 2024-06-10 DIAGNOSIS — E1165 Type 2 diabetes mellitus with hyperglycemia: Secondary | ICD-10-CM | POA: Diagnosis present

## 2024-06-10 DIAGNOSIS — I110 Hypertensive heart disease with heart failure: Secondary | ICD-10-CM | POA: Diagnosis present

## 2024-06-10 DIAGNOSIS — Z8614 Personal history of Methicillin resistant Staphylococcus aureus infection: Secondary | ICD-10-CM

## 2024-06-10 DIAGNOSIS — M86171 Other acute osteomyelitis, right ankle and foot: Secondary | ICD-10-CM | POA: Diagnosis present

## 2024-06-10 DIAGNOSIS — S86011A Strain of right Achilles tendon, initial encounter: Secondary | ICD-10-CM | POA: Diagnosis not present

## 2024-06-10 DIAGNOSIS — Z8249 Family history of ischemic heart disease and other diseases of the circulatory system: Secondary | ICD-10-CM | POA: Diagnosis not present

## 2024-06-10 DIAGNOSIS — I5032 Chronic diastolic (congestive) heart failure: Secondary | ICD-10-CM | POA: Diagnosis present

## 2024-06-10 DIAGNOSIS — M21172 Varus deformity, not elsewhere classified, left ankle: Secondary | ICD-10-CM | POA: Diagnosis present

## 2024-06-10 DIAGNOSIS — Z883 Allergy status to other anti-infective agents status: Secondary | ICD-10-CM

## 2024-06-10 DIAGNOSIS — I10 Essential (primary) hypertension: Secondary | ICD-10-CM | POA: Diagnosis not present

## 2024-06-10 DIAGNOSIS — I252 Old myocardial infarction: Secondary | ICD-10-CM

## 2024-06-10 DIAGNOSIS — Z91048 Other nonmedicinal substance allergy status: Secondary | ICD-10-CM

## 2024-06-10 DIAGNOSIS — L03115 Cellulitis of right lower limb: Principal | ICD-10-CM | POA: Diagnosis present

## 2024-06-10 DIAGNOSIS — Z951 Presence of aortocoronary bypass graft: Secondary | ICD-10-CM | POA: Diagnosis not present

## 2024-06-10 DIAGNOSIS — E669 Obesity, unspecified: Secondary | ICD-10-CM | POA: Diagnosis not present

## 2024-06-10 DIAGNOSIS — Z955 Presence of coronary angioplasty implant and graft: Secondary | ICD-10-CM

## 2024-06-10 DIAGNOSIS — R700 Elevated erythrocyte sedimentation rate: Secondary | ICD-10-CM | POA: Diagnosis present

## 2024-06-10 DIAGNOSIS — Z885 Allergy status to narcotic agent status: Secondary | ICD-10-CM

## 2024-06-10 DIAGNOSIS — Z83438 Family history of other disorder of lipoprotein metabolism and other lipidemia: Secondary | ICD-10-CM

## 2024-06-10 DIAGNOSIS — Z8619 Personal history of other infectious and parasitic diseases: Secondary | ICD-10-CM

## 2024-06-10 DIAGNOSIS — Z6834 Body mass index (BMI) 34.0-34.9, adult: Secondary | ICD-10-CM | POA: Diagnosis not present

## 2024-06-10 DIAGNOSIS — E559 Vitamin D deficiency, unspecified: Secondary | ICD-10-CM | POA: Diagnosis present

## 2024-06-10 DIAGNOSIS — N390 Urinary tract infection, site not specified: Secondary | ICD-10-CM | POA: Diagnosis not present

## 2024-06-10 DIAGNOSIS — I2510 Atherosclerotic heart disease of native coronary artery without angina pectoris: Secondary | ICD-10-CM | POA: Diagnosis not present

## 2024-06-10 DIAGNOSIS — Z7985 Long-term (current) use of injectable non-insulin antidiabetic drugs: Secondary | ICD-10-CM

## 2024-06-10 DIAGNOSIS — K219 Gastro-esophageal reflux disease without esophagitis: Secondary | ICD-10-CM | POA: Diagnosis present

## 2024-06-10 DIAGNOSIS — F17200 Nicotine dependence, unspecified, uncomplicated: Secondary | ICD-10-CM | POA: Diagnosis present

## 2024-06-10 DIAGNOSIS — R600 Localized edema: Secondary | ICD-10-CM | POA: Diagnosis not present

## 2024-06-10 DIAGNOSIS — L089 Local infection of the skin and subcutaneous tissue, unspecified: Secondary | ICD-10-CM | POA: Diagnosis not present

## 2024-06-10 DIAGNOSIS — M86672 Other chronic osteomyelitis, left ankle and foot: Secondary | ICD-10-CM | POA: Diagnosis present

## 2024-06-10 DIAGNOSIS — M7731 Calcaneal spur, right foot: Secondary | ICD-10-CM | POA: Diagnosis not present

## 2024-06-10 DIAGNOSIS — Z9109 Other allergy status, other than to drugs and biological substances: Secondary | ICD-10-CM

## 2024-06-10 DIAGNOSIS — F419 Anxiety disorder, unspecified: Secondary | ICD-10-CM | POA: Diagnosis present

## 2024-06-10 DIAGNOSIS — E1151 Type 2 diabetes mellitus with diabetic peripheral angiopathy without gangrene: Secondary | ICD-10-CM | POA: Diagnosis present

## 2024-06-10 DIAGNOSIS — E785 Hyperlipidemia, unspecified: Secondary | ICD-10-CM | POA: Diagnosis not present

## 2024-06-10 DIAGNOSIS — Z881 Allergy status to other antibiotic agents status: Secondary | ICD-10-CM

## 2024-06-10 DIAGNOSIS — R7982 Elevated C-reactive protein (CRP): Secondary | ICD-10-CM | POA: Diagnosis present

## 2024-06-10 DIAGNOSIS — E1140 Type 2 diabetes mellitus with diabetic neuropathy, unspecified: Secondary | ICD-10-CM | POA: Diagnosis present

## 2024-06-10 DIAGNOSIS — Z89421 Acquired absence of other right toe(s): Secondary | ICD-10-CM

## 2024-06-10 DIAGNOSIS — Z87891 Personal history of nicotine dependence: Secondary | ICD-10-CM

## 2024-06-10 DIAGNOSIS — E0865 Diabetes mellitus due to underlying condition with hyperglycemia: Principal | ICD-10-CM

## 2024-06-10 DIAGNOSIS — Z90710 Acquired absence of both cervix and uterus: Secondary | ICD-10-CM

## 2024-06-10 DIAGNOSIS — M129 Arthropathy, unspecified: Secondary | ICD-10-CM | POA: Diagnosis not present

## 2024-06-10 DIAGNOSIS — E66811 Obesity, class 1: Secondary | ICD-10-CM | POA: Diagnosis present

## 2024-06-10 DIAGNOSIS — M868X7 Other osteomyelitis, ankle and foot: Secondary | ICD-10-CM | POA: Diagnosis not present

## 2024-06-10 DIAGNOSIS — Z7982 Long term (current) use of aspirin: Secondary | ICD-10-CM

## 2024-06-10 HISTORY — DX: Local infection of the skin and subcutaneous tissue, unspecified: L08.9

## 2024-06-10 HISTORY — DX: Cutaneous abscess of right foot: L02.611

## 2024-06-10 LAB — URINALYSIS, ROUTINE W REFLEX MICROSCOPIC
Bilirubin Urine: NEGATIVE
Glucose, UA: >=500 mg/dL — AB
Ketones, ur: NEGATIVE mg/dL
Leukocytes,Ua: NEGATIVE
Nitrite: POSITIVE — AB
Protein, ur: >=300 mg/dL — AB
RBC / HPF: >50 RBC/hpf (ref 0–5)
Specific Gravity, Urine: 1.029 (ref 1.005–1.030)
pH: 5 (ref 5.0–8.0)

## 2024-06-10 LAB — COMPREHENSIVE METABOLIC PANEL WITH GFR
ALT: 11 U/L (ref 0–44)
AST: 11 U/L — ABNORMAL LOW (ref 15–41)
Albumin: 2.7 g/dL — ABNORMAL LOW (ref 3.5–5.0)
Alkaline Phosphatase: 55 U/L (ref 38–126)
Anion gap: 12 (ref 5–15)
BUN: 17 mg/dL (ref 8–23)
CO2: 22 mmol/L (ref 22–32)
Calcium: 9.6 mg/dL (ref 8.9–10.3)
Chloride: 103 mmol/L (ref 98–111)
Creatinine, Ser: 1.03 mg/dL — ABNORMAL HIGH (ref 0.44–1.00)
GFR, Estimated: >60 mL/min (ref >60)
Glucose, Bld: 264 mg/dL — ABNORMAL HIGH (ref 70–99)
Potassium: 4.4 mmol/L (ref 3.5–5.1)
Sodium: 137 mmol/L (ref 135–145)
Total Bilirubin: 0.5 mg/dL (ref 0.0–1.2)
Total Protein: 8 g/dL (ref 6.5–8.1)

## 2024-06-10 LAB — CBC WITH DIFFERENTIAL/PLATELET
Abs Immature Granulocytes: 0.25 10*3/uL — ABNORMAL HIGH (ref 0.00–0.07)
Basophils Absolute: 0.1 10*3/uL (ref 0.0–0.2)
Basophils Absolute: 0.1 10*3/uL (ref 0.0–0.1)
Basophils Relative: 1 %
Basos: 1 %
EOS (ABSOLUTE): 0.2 10*3/uL (ref 0.0–0.4)
Eos: 1 %
Eosinophils Absolute: 0.2 10*3/uL (ref 0.0–0.5)
Eosinophils Relative: 1 %
HCT: 36.6 % (ref 36.0–46.0)
Hematocrit: 36.5 % (ref 34.0–46.6)
Hemoglobin: 11.8 g/dL (ref 11.1–15.9)
Hemoglobin: 11.9 g/dL — ABNORMAL LOW (ref 12.0–15.0)
Immature Grans (Abs): 0.2 10*3/uL — ABNORMAL HIGH (ref 0.0–0.1)
Immature Granulocytes: 1 %
Immature Granulocytes: 1 %
Lymphocytes Absolute: 2 10*3/uL (ref 0.7–3.1)
Lymphocytes Relative: 8 %
Lymphs Abs: 1.7 10*3/uL (ref 0.7–4.0)
Lymphs: 9 %
MCH: 28.8 pg (ref 26.6–33.0)
MCH: 29 pg (ref 26.0–34.0)
MCHC: 32.3 g/dL (ref 31.5–35.7)
MCHC: 32.5 g/dL (ref 30.0–36.0)
MCV: 89 fL (ref 79–97)
MCV: 89.1 fL (ref 80.0–100.0)
Monocytes Absolute: 1.2 10*3/uL — ABNORMAL HIGH (ref 0.1–0.9)
Monocytes Absolute: 1 10*3/uL (ref 0.1–1.0)
Monocytes Relative: 4 %
Monocytes: 5 %
Neutro Abs: 19.3 10*3/uL — ABNORMAL HIGH (ref 1.7–7.7)
Neutrophils Absolute: 19 10*3/uL — ABNORMAL HIGH (ref 1.4–7.0)
Neutrophils Relative %: 85 %
Neutrophils: 83 %
Platelets: 403 10*3/uL (ref 150–450)
Platelets: 433 10*3/uL — ABNORMAL HIGH (ref 150–400)
RBC: 4.1 x10E6/uL (ref 3.77–5.28)
RBC: 4.11 MIL/uL (ref 3.87–5.11)
RDW: 12.9 % (ref 11.7–15.4)
RDW: 13.2 % (ref 11.5–15.5)
WBC: 22.6 10*3/uL (ref 3.4–10.8)
WBC: 22.5 10*3/uL — ABNORMAL HIGH (ref 4.0–10.5)
nRBC: 0 % (ref 0.0–0.2)

## 2024-06-10 LAB — I-STAT CG4 LACTIC ACID, ED
Lactic Acid, Venous: 0.7 mmol/L (ref 0.5–1.9)
Lactic Acid, Venous: 1.4 mmol/L (ref 0.5–1.9)

## 2024-06-10 LAB — C-REACTIVE PROTEIN
CRP: 61 mg/L — ABNORMAL HIGH (ref 0–10)
CRP: 11.2 mg/dL — ABNORMAL HIGH (ref <1.0)

## 2024-06-10 LAB — SEDIMENTATION RATE
Sed Rate: 107 mm/h — ABNORMAL HIGH (ref 0–40)
Sed Rate: 120 mm/h — ABNORMAL HIGH (ref 0–22)

## 2024-06-10 LAB — GLUCOSE, CAPILLARY: Glucose-Capillary: 226 mg/dL — ABNORMAL HIGH (ref 70–99)

## 2024-06-10 MED ORDER — SENNA 8.6 MG PO TABS
1.0000 | ORAL_TABLET | Freq: Every day | ORAL | Status: DC
  Administered 2024-06-10 – 2024-06-17 (×8): 8.6 mg via ORAL
  Filled 2024-06-10 (×8): qty 1

## 2024-06-10 MED ORDER — ROSUVASTATIN CALCIUM 20 MG PO TABS
20.0000 mg | ORAL_TABLET | Freq: Every day | ORAL | Status: DC
  Administered 2024-06-10 – 2024-06-17 (×8): 20 mg via ORAL
  Filled 2024-06-10 (×8): qty 1

## 2024-06-10 MED ORDER — INSULIN ASPART 100 UNIT/ML IJ SOLN
0.0000 [IU] | Freq: Three times a day (TID) | INTRAMUSCULAR | Status: DC
  Administered 2024-06-11: 2 [IU] via SUBCUTANEOUS

## 2024-06-10 MED ORDER — AMIODARONE HCL 200 MG PO TABS
200.0000 mg | ORAL_TABLET | Freq: Every day | ORAL | Status: DC
Start: 2024-06-10 — End: 2024-06-17
  Administered 2024-06-10 – 2024-06-17 (×8): 200 mg via ORAL
  Filled 2024-06-10 (×8): qty 1

## 2024-06-10 MED ORDER — OXYCODONE HCL 5 MG PO TABS
5.0000 mg | ORAL_TABLET | Freq: Four times a day (QID) | ORAL | Status: DC | PRN
  Administered 2024-06-10 – 2024-06-17 (×7): 5 mg via ORAL
  Filled 2024-06-10 (×8): qty 1

## 2024-06-10 MED ORDER — SODIUM CHLORIDE 0.9 % IV SOLN
2.0000 g | Freq: Once | INTRAVENOUS | Status: AC
  Administered 2024-06-10: 2 g via INTRAVENOUS
  Filled 2024-06-10: qty 12.5

## 2024-06-10 MED ORDER — ACETAMINOPHEN 325 MG PO TABS
650.0000 mg | ORAL_TABLET | ORAL | Status: DC | PRN
  Administered 2024-06-15 – 2024-06-17 (×3): 650 mg via ORAL
  Filled 2024-06-10 (×4): qty 2

## 2024-06-10 MED ORDER — NITROGLYCERIN 0.4 MG SL SUBL: 0.4000 mg | SUBLINGUAL_TABLET | SUBLINGUAL | Status: DC | PRN

## 2024-06-10 MED ORDER — ASPIRIN 81 MG PO CHEW
81.0000 mg | CHEWABLE_TABLET | Freq: Every day | ORAL | Status: DC
Start: 2024-06-10 — End: 2024-06-17
  Administered 2024-06-10 – 2024-06-17 (×8): 81 mg via ORAL
  Filled 2024-06-10 (×8): qty 1

## 2024-06-10 MED ORDER — INSULIN GLARGINE (1 UNIT DIAL) 300 UNIT/ML ~~LOC~~ SOPN: 50.0000 [IU] | PEN_INJECTOR | Freq: Every day | SUBCUTANEOUS | Status: DC

## 2024-06-10 MED ORDER — HEPARIN SODIUM (PORCINE) 5000 UNIT/ML IJ SOLN
5000.0000 [IU] | Freq: Three times a day (TID) | INTRAMUSCULAR | Status: DC
  Administered 2024-06-10 – 2024-06-17 (×20): 5000 [IU] via SUBCUTANEOUS
  Filled 2024-06-10 (×21): qty 1

## 2024-06-10 MED ORDER — METOPROLOL SUCCINATE ER 25 MG PO TB24
12.5000 mg | ORAL_TABLET | Freq: Every day | ORAL | Status: DC
  Administered 2024-06-10 – 2024-06-17 (×8): 12.5 mg via ORAL
  Filled 2024-06-10 (×8): qty 1

## 2024-06-10 MED ORDER — INSULIN GLARGINE-YFGN 100 UNIT/ML ~~LOC~~ SOLN
50.0000 [IU] | Freq: Every day | SUBCUTANEOUS | Status: DC
  Administered 2024-06-11 – 2024-06-14 (×4): 50 [IU] via SUBCUTANEOUS
  Filled 2024-06-10 (×4): qty 0.5

## 2024-06-10 MED ORDER — DAPAGLIFLOZIN PROPANEDIOL 10 MG PO TABS
10.0000 mg | ORAL_TABLET | Freq: Every day | ORAL | Status: DC
  Administered 2024-06-10 – 2024-06-17 (×8): 10 mg via ORAL
  Filled 2024-06-10 (×8): qty 1

## 2024-06-10 MED ORDER — SODIUM CHLORIDE 0.9 % IV SOLN
2.0000 g | Freq: Three times a day (TID) | INTRAVENOUS | Status: DC
  Administered 2024-06-11: 2 g via INTRAVENOUS
  Filled 2024-06-10 (×2): qty 12.5

## 2024-06-10 MED ORDER — INSULIN ASPART 100 UNIT/ML IJ SOLN
0.0000 [IU] | Freq: Every day | INTRAMUSCULAR | Status: DC
  Administered 2024-06-10 – 2024-06-13 (×2): 2 [IU] via SUBCUTANEOUS

## 2024-06-10 MED ORDER — IRBESARTAN 150 MG PO TABS
75.0000 mg | ORAL_TABLET | Freq: Every day | ORAL | Status: DC
  Administered 2024-06-10 – 2024-06-17 (×8): 75 mg via ORAL
  Filled 2024-06-10 (×8): qty 1

## 2024-06-10 MED ORDER — ACETAMINOPHEN 325 MG PO TABS
650.0000 mg | ORAL_TABLET | Freq: Once | ORAL | Status: AC
  Administered 2024-06-10: 650 mg via ORAL
  Filled 2024-06-10: qty 2

## 2024-06-10 MED ORDER — LINEZOLID 600 MG/300ML IV SOLN
600.0000 mg | Freq: Two times a day (BID) | INTRAVENOUS | Status: DC
  Administered 2024-06-10 – 2024-06-12 (×4): 600 mg via INTRAVENOUS
  Filled 2024-06-10 (×5): qty 300

## 2024-06-10 MED ORDER — MUPIROCIN 2 % EX OINT
1.0000 | TOPICAL_OINTMENT | Freq: Two times a day (BID) | CUTANEOUS | Status: DC
  Administered 2024-06-10: 1 via NASAL

## 2024-06-10 MED ORDER — HYDRALAZINE HCL 20 MG/ML IJ SOLN: 5.0000 mg | INTRAMUSCULAR | Status: DC | PRN

## 2024-06-10 MED ORDER — FUROSEMIDE 20 MG PO TABS
20.0000 mg | ORAL_TABLET | Freq: Every day | ORAL | Status: DC
  Administered 2024-06-10 – 2024-06-17 (×8): 20 mg via ORAL
  Filled 2024-06-10 (×8): qty 1

## 2024-06-10 NOTE — H&P (Signed)
 [facility] H&P   Patient Demographics:    Jasmine Richardson, is a 63 y.o. female  MRN: [id number]   DOB - September 11, 1961  Admit Date - 06/10/2024  Outpatient Primary MD for the patient is Nicholaus Credit, PA-C  Referring MD/NP/PA: DORISTINE Derry  Outpatient Specialists: Podiatry Dr. Lamount  Patient coming from: Home  Chief Complaint  Patient presents with   Wound Infection    Pt.,s  Dr. Rozetta from [facility] contacted pt. Today to come to ED for a rt. Heel infection.        HPI:    Jasmine Richardson  is a 63 y.o. female, with past medical history of CAD status post CABG 2017, PCI 2021, hypertension, hyperlipidemia, diabetes mellitus, type II, insulin -dependent, ventricular tachycardia, status post AICD, obesity, neuropathy, patient with recent right heel wound, as she stepped on a piece of glass, treated initially with doxycycline , then Bactrim , she saw podiatry Dr. Ray 6/16, then 6/24, 7 for cellulitis and foreign body of the right heel, had small piece of glass was reportedly removed from the area, follow-up in yesterday showing leukocytosis, worsening pain, I&D was performed at bedside which was concerning for abscess, that was drained partially, with some removal of purulence, patient was sent to ED for further evaluation given continued pain, redness and worsening leukocytosis -  In ED workup significant for leukocytosis at 22.5, blood cultures were obtained, MRI of right foot is pending, and [facility] consulted to admit   Review of systems:      A full 10 point Review of Systems was done, except as stated above, all other Review of Systems were negative.   With Past History of the following :    Past Medical History:  Diagnosis Date   Abnormal EKG 10/23/2021   Abnormal mammogram 08/09/2023   Abnormal mammogram of left breast 09/04/2021   Abnormal stress test 10/23/2021    Abscess of left elbow 02/18/2024   Absolute anemia 09/20/2022   Acquired varus deformity of foot, left    Acute chest pain 05/08/2016   Acute hemorrhagic cystitis 08/09/2023   AICD (automatic cardioverter/defibrillator) present    St Jude/Abbott device   AKI (acute kidney injury) (HCC) 10/23/2021   Amputation of right great toe (HCC) 09/20/2022   Angina pectoris (HCC) 09/11/2017   Anxiety 10/23/2021   B12 deficiency 09/20/2022   Bacteremia 11/22/2021   Breast wound, right, subsequent encounter 09/04/2021   Burping 05/08/2016   Cancer (HCC)    cervical - hysterectomy   Cellulitis of right breast 10/23/2021   Cellulitis, wound, post-operative 10/09/2021   Chronic diastolic heart failure (HCC) 11/06/2016   Chronic osteomyelitis of left foot (HCC) 10/16/2023   Colon cancer screening 09/20/2022   Coronary artery disease involving native coronary artery of native heart with angina pectoris (HCC) 09/12/2015   Overview:  PCI and stent of RCA 2009, last cath 2012 with medical therapy  CABG May 2017   Demand ischemia [facility])    Diabetes mellitus due to underlying condition with hyperglycemia, with long-term current use of insulin  (HCC) 09/20/2022   Diabetic foot infection (HCC) 11/21/2021   Diabetic foot ulcer (HCC) 02/16/2020   Emphysema lung (HCC) 09/11/2017   patient not aware of this dx   Encounter for preoperative assessment 10/16/2023   Essential hypertension 09/12/2015   Gangrene (HCC) 10/23/2021   GERD (gastroesophageal reflux disease) 05/08/2016   Hematoma 10/23/2021   Hiatal hernia 10/23/2021   History of methicillin resistant staphylococcus aureus (MRSA)    Hyperglycemia 10/23/2021   Hyperglycemia due to type 2 diabetes mellitus (HCC) 10/23/2021   Hyperkalemia    Hyperlipidemia 09/12/2015   Hyponatremia 10/23/2021   ICD (implantable cardioverter-defibrillator) in place 05/16/2021   Infection of great toe 10/23/2021   Intertrigo 09/20/2022   Lactic acidosis 10/23/2021    Leukocytosis 10/23/2021   Mediastinitis 06/28/2016   Morbid obesity (HCC) 05/08/2016   Myocardial infarction (HCC)    Needs flu shot 09/20/2022   NSTEMI (non-ST elevated myocardial infarction) (HCC) 05/22/2020   Obesity (BMI 30-39.9) 10/23/2021   Partial nontraumatic amputation of left foot (HCC) 09/20/2022   Perineal abscess 10/23/2021   Peripheral vascular disease (HCC)    S/P CABG (coronary artery bypass graft) 06/03/2016   Overview:  The patient underwent sternal reconstruction on 06/21/16 with pec flaps for mediastinitis from a prior CABG in May 2017. On admission, she was critically ill from sepsis and had altered mental status. She was last seen in clinic on 08/09/16 at which time she was doing well.   Severe sepsis (HCC) 06/03/2016   Sinus tachycardia 05/08/2016   Tobacco use disorder 04/20/2016   Overview:  Quit in May 2017.   Type 2 diabetes mellitus with foot ulcer (HCC) 05/08/2016   Uncontrolled type 2 diabetes mellitus with hyperglycemia (HCC) 02/18/2024   Unstable angina (HCC) 05/22/2020   Ventricular tachycardia (HCC) 12/17/2020   Vitamin D  deficiency 09/20/2022   Wound, surgical, infected 06/06/2016   Overview:  sternal      Past Surgical History:  Procedure Laterality Date   ABDOMINAL AORTOGRAM W/LOWER EXTREMITY N/A 10/11/2021   Procedure: ABDOMINAL AORTOGRAM W/LOWER EXTREMITY;  Surgeon: Gretta Lonni PARAS, MD;  Location: [facility];  Service: Cardiovascular;  Laterality: N/A;   AMPUTATION TOE Right 02/20/2020   Procedure: AMPUTATION RIGHT GREAT  TOE;  Surgeon: Tobie Franky SQUIBB, DPM;  Location: [facility] OR;  Service: Podiatry;  Laterality: Right;   BLADDER SURGERY     BONE BIOPSY Left 02/01/2022   Procedure: BONE BIOPSY LEFT FOOT, IRRIGATION AND DEBRIDEMENT;  Surgeon: Burt Fus, DPM;  Location: [facility] OR;  Service: Podiatry;  Laterality: Left;   BONE BIOPSY Left 10/31/2023   Procedure: BONE BIOPSY;  Surgeon: Janit Thresa HERO, DPM;  Location: [facility] ORS;  Service:  Orthopedics/Podiatry;  Laterality: Left;   BUBBLE STUDY  11/27/2021   Procedure: BUBBLE STUDY;  Surgeon: Okey Vina GAILS, MD;  Location: [facility] ENDOSCOPY;  Service: Cardiovascular;;   CARDIAC CATHETERIZATION     CORONARY ARTERY BYPASS GRAFT     CORONARY STENT INTERVENTION N/A 05/25/2020   Procedure: CORONARY STENT INTERVENTION;  Surgeon: Darron Deatrice LABOR, MD;  Location: [facility];  Service: Cardiovascular;  Laterality: N/A;   CORONARY STENT INTERVENTION N/A 06/22/2020   Procedure: CORONARY STENT INTERVENTION;  Surgeon: Anner Alm ORN, MD;  Location: [facility] INVASIVE CV LAB;  Service: Cardiovascular;  Laterality: N/A;   CORONARY ULTRASOUND/IVUS N/A 06/22/2020   Procedure: Intravascular Ultrasound/IVUS;  Surgeon: Anner Alm ORN, MD;  Location: [facility] INVASIVE CV LAB;  Service: Cardiovascular;  Laterality: N/A;   ICD IMPLANT N/A 12/19/2020   Procedure: ICD IMPLANT;  Surgeon: Waddell Danelle ORN, MD;  Location: [facility] INVASIVE CV LAB;  Service: Cardiovascular;  Laterality: N/A;   INCISION AND DRAINAGE Right 02/19/2020   Procedure: INCISION AND DRAINAGE;  Surgeon: Gershon Donnice SAUNDERS, DPM;  Location: [facility] OR;  Service: Podiatry;  Laterality: Right;  Block done by surgeon   INCISION AND DRAINAGE OF WOUND Left 10/31/2023   Procedure: IRRIGATION AND DEBRIDEMENT WOUND;  Surgeon: Janit Thresa HERO, DPM;  Location: [facility] ORS;  Service: Orthopedics/Podiatry;  Laterality: Left;   IRRIGATION AND DEBRIDEMENT FOOT Left 10/10/2021   Procedure: IRRIGATION AND DEBRIDEMENT FOOT;  Surgeon: Burt Fus, DPM;  Location: [facility];  Service: Podiatry;  Laterality: Left;   IRRIGATION AND DEBRIDEMENT FOOT Left 11/23/2021   Procedure: IRRIGATION AND DEBRIDEMENT FOOT;  Surgeon: Joya Stabs, MD;  Location: [facility];  Service: Podiatry;  Laterality: Left;  will do local block   LEFT HEART CATH AND CORS/GRAFTS ANGIOGRAPHY N/A 05/23/2020   Procedure: LEFT HEART CATH AND CORS/GRAFTS ANGIOGRAPHY;  Surgeon: Mady Bruckner, MD;  [facility]: [facility];  Service: Cardiovascular;  Laterality: N/A;   LEFT HEART CATH AND CORS/GRAFTS ANGIOGRAPHY N/A 12/19/2020   Procedure: LEFT HEART CATH AND CORS/GRAFTS ANGIOGRAPHY;  Surgeon: Verlin Bruckner BIRCH, MD;  [facility]: [facility];  Service: Cardiovascular;  Laterality: N/A;   LEFT HEART CATH AND CORS/GRAFTS ANGIOGRAPHY N/A 07/21/2021   Procedure: LEFT HEART CATH AND CORS/GRAFTS ANGIOGRAPHY;  Surgeon: Mady Bruckner, MD;  [facility]: [facility];  Service: Cardiovascular;  Laterality: N/A;   METATARSAL HEAD EXCISION Right 05/02/2020   Procedure: FIRST METATARSAL HEAD RESECTION; RIGHT FOOT WOUND CLOSURE;  Surgeon: Burt Fus, DPM;  Location: [facility];  Service: Podiatry;  Laterality: Right;  MAC W/LOCAL   PERIPHERAL VASCULAR BALLOON ANGIOPLASTY Left 10/11/2021   Procedure: PERIPHERAL VASCULAR BALLOON ANGIOPLASTY;  Surgeon: Gretta Bruckner PARAS, MD;  Location: [facility];  Service: Cardiovascular;  Laterality: Left;  Anterior Tibial Artery   TEE WITHOUT CARDIOVERSION N/A 11/27/2021   Procedure: TRANSESOPHAGEAL ECHOCARDIOGRAM (TEE);  Surgeon: Okey Vina GAILS, MD;  Location: [facility] ENDOSCOPY;  Service: Cardiovascular;  Laterality: N/A;   TIBIALIS TENDON TRANSFER / REPAIR Left 12/06/2023   Procedure: TIBIALIS TENDON TRANSFER;  Surgeon: Janit Thresa HERO, DPM;  Location: [facility] ORS;  Service: Orthopedics/Podiatry;  Laterality: Left;   TRANSMETATARSAL AMPUTATION Left 11/23/2021   Procedure: TRANSMETATARSAL AMPUTATION;  Surgeon: Joya Stabs, MD;  Location: [facility] OR;  Service: Podiatry;  Laterality: Left;   TUBAL LIGATION        Social History:     Social History   Tobacco Use   Smoking status: Former    Current packs/day: 0.00    Types: Cigarettes    Quit date: 05/2016    Years since quitting: 8.0   Smokeless tobacco: Never  Substance Use Topics   Alcohol use: Yes    Comment: rare       Family History :     Family History  Problem Relation Age of Onset    Hypertension Mother    Hyperlipidemia Mother    Heart attack Father    Heart disease Father    Hypertension Father    Alzheimer's disease Father    Hypertension Brother    Hyperlipidemia Brother       Home Medications:   Prior to Admission medications   Medication  Sig Start Date End Date Taking? Authorizing Provider  acetaminophen  (TYLENOL ) 325 MG tablet Take 2 tablets (650 mg total) by mouth every 4 (four) hours as needed for headache or mild pain. 10/14/21   Samtani, Jai-Gurmukh, MD  amiodarone  (PACERONE ) 200 MG tablet Take 1 tablet (200 mg total) by mouth daily. 05/05/24   Monetta Redell PARAS, MD  aspirin  81 MG chewable tablet Chew 81 mg by mouth daily.    [provider]  clopidogrel  (PLAVIX ) 75 MG tablet Take 1 tablet (75 mg total) by mouth daily. 05/26/24   Madireddy, Alean SAUNDERS, MD  Continuous Glucose Sensor (DEXCOM G7 SENSOR) MISC Change sensor every 10 days as directed 04/29/24   Nicholaus Credit, PA-C  Evolocumab  (REPATHA  SURECLICK) 140 MG/ML SOAJ Inject 140 mg into the skin every 14 (fourteen) days. 03/13/24   Nicholaus Credit, PA-C  FARXIGA  10 MG TABS tablet Take 1 tablet by mouth once daily 04/06/24   Nicholaus Credit, PA-C  furosemide  (LASIX ) 20 MG tablet Take 1 tablet (20 mg total) by mouth daily. Patient taking differently: Take 20 mg by mouth every other day. 08/06/22   Monetta Redell PARAS, MD  HUMALOG  KWIKPEN 100 UNIT/ML KwikPen Inject 10-50 Units into the skin at bedtime. Sliding scale 12/26/22   Nicholaus Credit, PA-C  insulin  glargine, 1 Unit Dial , (TOUJEO  SOLOSTAR) 300 UNIT/ML Solostar Pen Inject 63 Units into the skin daily. 05/14/24   Cox, Abigail, MD  Insulin  Pen Needle (INSUPEN PEN NEEDLES) 32G X 4 MM MISC Use as directed once daily with insulin . 05/15/24   Cox, Kirsten, MD  metFORMIN  (GLUCOPHAGE ) 500 MG tablet TAKE 1 TABLET BY MOUTH TWICE DAILY WITH A MEAL 01/30/24   Nicholaus Credit, PA-C  metoprolol  succinate (TOPROL -XL) 25 MG 24 hr tablet TAKE 1 TABLET BY MOUTH IN THE MORNING, AT NOON AND AT  BEDTIME 05/06/24   Monetta Redell PARAS, MD  mupirocin  ointment (BACTROBAN ) 2 % Apply 1 Application topically 2 (two) times daily. 02/05/24   Raspet, Erin K, PA-C  nitroGLYCERIN  (NITROSTAT ) 0.4 MG SL tablet Place 1 tablet (0.4 mg total) under the tongue every 5 (five) minutes as needed for chest pain. 09/20/22   Nicholaus Credit, PA-C  nystatin  (MYCOSTATIN /NYSTOP ) powder Apply 1 Application topically daily as needed (rash/yeast). 10/16/23   Nicholaus Credit, PA-C  oxyCODONE  (ROXICODONE ) 5 MG immediate release tablet Take 1 tablet (5 mg total) by mouth every 6 (six) hours as needed for up to 10 doses for severe pain (pain score 7-10). 06/01/24   Lamount Ethan CROME, DPM  rosuvastatin  (CRESTOR ) 20 MG tablet Take 1 tablet (20 mg total) by mouth daily. 04/29/23   Monetta Redell PARAS, MD  Semaglutide ,0.25 or 0.5MG /DOS, (OZEMPIC , 0.25 OR 0.5 MG/DOSE,) 2 MG/3ML SOPN Inject 0.25 mg into the skin once weekly for 4 weeks, then increase to 0.5 mg once weekly 05/14/24 06/25/24  CoxAbigail, MD  senna (SENOKOT) 8.6 MG tablet Take 1 tablet (8.6 mg total) by mouth daily for 10 days. May discontinue once symptoms resolve 06/09/24 06/19/24  Lamount Ethan CROME, DPM  sulfamethoxazole -trimethoprim  (BACTRIM  DS) 800-160 MG tablet Take 1 tablet by mouth 2 (two) times daily. 06/09/24   Lamount Ethan CROME, DPM  valsartan  (DIOVAN ) 80 MG tablet Take 1 tablet (80 mg total) by mouth 2 (two) times daily. 04/29/23   Monetta Redell PARAS, MD  Vitamin D , Ergocalciferol , (DRISDOL ) 1.25 MG (50000 UNIT) CAPS capsule TAKE 1 CAPSULE BY MOUTH EVERY SATURDAY 03/30/24   Nicholaus Credit, PA-C     Allergies:  Allergies  Allergen Reactions   Vancomycin  Anaphylaxis   Chlorhexidine  Gluconate Itching    Received CHG bath, began itching, required benadryl    Cat Dander Other (See Comments)    Sneezing, watery eyes.   Other Itching    Dial  Soap   Tramadol  Other (See Comments)    Hallucinations   Codeine Rash     Physical Exam:   Vitals  Blood pressure (!) 153/87, pulse 77,  temperature 98.3 F (36.8 C), temperature source Oral, resp. rate 16, height 5' 7 (1.702 m), weight 99.3 kg, SpO2 100%.   1. General Well-developed female, laying in bed, no apparent distress  2. Normal affect and insight, Not Suicidal or Homicidal, Awake Alert, Oriented X 3.  3. No F.N deficits, ALL C.Nerves Intact, Strength 5/5 all 4 extremities, Sensation intact all 4 extremities, Plantars down going.  4. Ears and Eyes appear Normal, Conjunctivae clear, PERRLA. Moist Oral Mucosa.  5. Supple Neck, No JVD, No cervical lymphadenopathy appriciated, No Carotid Bruits.  6. Symmetrical Chest wall movement, Good air movement bilaterally, CTAB.  7. RRR, No Gallops, Rubs or Murmurs, No Parasternal Heave.  8. Positive Bowel Sounds, Abdomen Soft, No tenderness, No organomegaly appriciated,No rebound -guarding or rigidity.  9.  No Cyanosis, Normal Skin Turgor, No Skin Rash or Bruise.  10. Left TMA, right heel swelling ,Erythema     Data Review:    CBC Recent Labs  Lab 06/09/24 1706 06/10/24 1311  WBC 22.6* 22.5*  HGB 11.8 11.9*  HCT 36.5 36.6  PLT 403 433*  MCV 89 89.1  MCH 28.8 29.0  MCHC 32.3 32.5  RDW 12.9 13.2  LYMPHSABS 2.0 1.7  MONOABS  --  1.0  EOSABS 0.2 0.2  BASOSABS 0.1 0.1   ------------------------------------------------------------------------------------------------------------------  Chemistries  Recent Labs  Lab 06/10/24 1311  NA 137  K 4.4  CL 103  CO2 22  GLUCOSE 264*  BUN 17  CREATININE 1.03*  CALCIUM  9.6  AST 11*  ALT 11  ALKPHOS 55  BILITOT 0.5   ------------------------------------------------------------------------------------------------------------------ estimated creatinine clearance is 67.7 mL/min (A) (by C-G formula based on SCr of 1.03 mg/dL (H)). ------------------------------------------------------------------------------------------------------------------ No results for input(s): TSH, T4TOTAL, T3FREE, THYROIDAB in  the last 72 hours.  Invalid input(s): FREET3  Coagulation profile No results for input(s): INR, PROTIME in the last 168 hours. ------------------------------------------------------------------------------------------------------------------- No results for input(s): DDIMER in the last 72 hours. -------------------------------------------------------------------------------------------------------------------  Cardiac Enzymes No results for input(s): CKMB, TROPONINI, MYOGLOBIN in the last 168 hours.  Invalid input(s): CK ------------------------------------------------------------------------------------------------------------------    Component Value Date/Time   BNP 86.1 07/20/2021 0104     ---------------------------------------------------------------------------------------------------------------  Urinalysis    Component Value Date/Time   COLORURINE YELLOW 11/23/2021 0439   APPEARANCEUR CLEAR 11/23/2021 0439   LABSPEC 1.025 11/23/2021 0439   PHURINE 6.0 11/23/2021 0439   GLUCOSEU >=500 (A) 11/23/2021 0439   HGBUR TRACE (A) 11/23/2021 0439   BILIRUBINUR negative 10/16/2023 1516   KETONESUR negative 10/16/2023 1516   KETONESUR NEGATIVE 11/23/2021 0439   PROTEINUR >300 (A) 11/23/2021 0439   UROBILINOGEN negative (A) 10/16/2023 1516   NITRITE Positive (A) 10/16/2023 1516   NITRITE POSITIVE (A) 11/23/2021 0439   LEUKOCYTESUR Negative 10/16/2023 1516   LEUKOCYTESUR SMALL (A) 11/23/2021 0439    ----------------------------------------------------------------------------------------------------------------   Imaging Results:    DG Foot Complete Right Result Date: 06/09/2024 Please see detailed radiograph report in office note.     Assessment & Plan:    Principal Problem:   Right foot infection Active Problems:  S/P CABG (coronary artery bypass graft)   Tobacco use disorder   Ventricular tachycardia   ICD (implantable  cardioverter-defibrillator) in place   Obesity (BMI 30-39.9)   Uncontrolled type 2 diabetes mellitus with hyperglycemia (HCC)   Abscess of right foot   Right foot infection, with concern for abscess - Podiatry input greatly appreciated, concern for right heel cellulitis, and possible residual abscess, status post bedside I&D and foreign body removal at podiatry office - Follow-up blood cultures. - Start on broad-spectrum antibiotics, cefepime  and Zyvox  (has vancomycin  allergy) - Plan for MRI evaluate if abscess is present - Further management per podiatry pending MRI findings - Will continue with nonweightbearing due to pain in right LE, and wound care of Betadine  wet-to-dry dressing  Chronic diastolic CHF - Euvolemic.  Continue with home Lasix  - Last 2D echo with a preserved EF 60-65% in March 2024 - Continue with GDMT, including metoprolol , Farxiga  and valsartan .  History of CAD History of PVD - Was post CABG in 2017, and RCA PCI in June 2021. - Continue with aspirin , hold Plavix  for possible need of surgical intervention -Continue with statin -Continue with valsartan  and metoprolol   Hypertension -Blood pressure controlled, continue with home regimen, will add as needed hydralazine   Hyperlipidemia -Continue with home statin, she is on Repatha  at home as well  History of ventricular tachycardia status post AICD -Continue with amiodarone   Diabetes mellitus - Check A1c. - Continue with Farxiga  - Resume home dose Semglee  at a lower dose 63> 50 units daily, - Will add insulin  sliding scale  Obesity class I Body mass index is 34.3 kg/m. - she is on Ozempic  and metformin  as an outpatient    DVT Prophylaxis Heparin    AM Labs Ordered, also please review Full Orders  Family Communication: Admission, patients condition and plan of care including tests being ordered have been discussed with the patient  who indicate understanding and agree with the plan and Code  Status.  Code Status full code  Likely DC to home  Consults called: Podiatry and patient  Admission status: inpatient    Time spent in minutes : 70 minutes inpatient   Brayton Lye M.D on 06/10/2024 at 5:45 PM   [facility] - Office  [phone]

## 2024-06-10 NOTE — Progress Notes (Signed)
 Patient arrived to room. Alert and oriented x 4. VSS.

## 2024-06-10 NOTE — ED Notes (Signed)
 Extra DG, Blue & Red top drawn

## 2024-06-10 NOTE — ED Notes (Signed)
 5N Charge RN given report prior to patient leaving floor, ready for patient to come up

## 2024-06-10 NOTE — ED Triage Notes (Signed)
 Pt. Was seen by her Dr. Hurman [facility] yesterday and sent for  blood work at [facility] in [place] in [place].  Dr. [place]  this am and instructed pt. To come directly to ED for evaluation.

## 2024-06-10 NOTE — ED Notes (Signed)
 Pt. Requested IV team if an IV is needed.

## 2024-06-10 NOTE — Plan of Care (Signed)
   Problem: Education: Goal: Knowledge of General Education information will improve Description Including pain rating scale, medication(s)/side effects and non-pharmacologic comfort measures Outcome: Progressing   Problem: Health Behavior/Discharge Planning: Goal: Ability to manage health-related needs will improve Outcome: Progressing

## 2024-06-10 NOTE — ED Provider Triage Note (Signed)
 Emergency Medicine Provider Triage Evaluation Note  Jasmine Richardson , a 63 y.o. female  was evaluated in triage.  Pt complains of evaluated for cellulitis of her right foot yesterday by Dr.Semon, had blood work drawn outpatient, white blood cell count of 22,000, elevated CRP and sed rate. Consult call placed to podiatry for further recommendations.Spoke to Dr. Elsa, who evaluated patient while in the ED.  Review of Systems  Positive: Right foot pain Negative: fever  Physical Exam  BP (!) 140/82   Pulse 87   Temp 99.2 F (37.3 C)   Resp 18   Ht 5' 7 (1.702 m)   Wt 99.3 kg   LMP  (LMP Unknown)   SpO2 98%   BMI 34.30 kg/m  Gen:   Awake, no distress   Resp:  Normal effort  MSK:   Moves extremities without difficulty  Other:  Pulses present, mild erythema to the wound with some drainage.  Medical Decision Making  Medically screening exam initiated at 1:07 PM.  Appropriate orders placed.  [place] was informed that the remainder of the evaluation will be completed by another provider, this initial triage assessment does not replace that evaluation, and the importance of remaining in the ED until their evaluation is complete.  Spoke to Dr. Elsa, who evaluated patient in triage 1.  Agrees with workup thus far.   Jasmine Mccrystal, PA-C 06/10/24 1329

## 2024-06-10 NOTE — Consult Note (Signed)
 PODIATRY CONSULTATION  NAME Jasmine Richardson MRN [id number] DOB 1961-04-09 DOA 06/10/2024   Reason for consult: Right heel wound and concern for abscess  Attending/Consulting physician: ED  History of present illness: 63 y.o. female with history DM2 with neuropathy. History of recent right heel wound seen by dr. Awanda 05/27/24. Had been on doxycycline  since 6/8. She was then started on bactrim . Saw Dr. Lamount 6/16 and then 6/24 . Had trouble bearing weight and needed cam boot. Concern for cellulitis and foreign body on right heel. On 6/18 a small piece of glass was reportedly removed from the area. She followed up yesterday after removal of the glass. I&D was performed bedside and concern for abscess that was drained partially with some removal of purulence. She had lad work with concern for elevated white cell count and was told to come to ED today. Reports continued pain redness and swelling to the right heel.  Past Medical History:  Diagnosis Date   Abnormal EKG 10/23/2021   Abnormal mammogram 08/09/2023   Abnormal mammogram of left breast 09/04/2021   Abnormal stress test 10/23/2021   Abscess of left elbow 02/18/2024   Absolute anemia 09/20/2022   Acquired varus deformity of foot, left    Acute chest pain 05/08/2016   Acute hemorrhagic cystitis 08/09/2023   AICD (automatic cardioverter/defibrillator) present    St Jude/Abbott device   AKI (acute kidney injury) (HCC) 10/23/2021   Amputation of right great toe (HCC) 09/20/2022   Angina pectoris (HCC) 09/11/2017   Anxiety 10/23/2021   B12 deficiency 09/20/2022   Bacteremia 11/22/2021   Breast wound, right, subsequent encounter 09/04/2021   Burping 05/08/2016   Cancer (HCC)    cervical - hysterectomy   Cellulitis of right breast 10/23/2021   Cellulitis, wound, post-operative 10/09/2021   Chronic diastolic heart failure (HCC) 11/06/2016   Chronic osteomyelitis of left foot (HCC) 10/16/2023   Colon cancer screening 09/20/2022    Coronary artery disease involving native coronary artery of native heart with angina pectoris (HCC) 09/12/2015   Overview:  PCI and stent of RCA 2009, last cath 2012 with medical therapy  CABG May 2017   Demand ischemia [facility])    Diabetes mellitus due to underlying condition with hyperglycemia, with long-term current use of insulin  (HCC) 09/20/2022   Diabetic foot infection (HCC) 11/21/2021   Diabetic foot ulcer (HCC) 02/16/2020   Emphysema lung (HCC) 09/11/2017   patient not aware of this dx   Encounter for preoperative assessment 10/16/2023   Essential hypertension 09/12/2015   Gangrene (HCC) 10/23/2021   GERD (gastroesophageal reflux disease) 05/08/2016   Hematoma 10/23/2021   Hiatal hernia 10/23/2021   History of methicillin resistant staphylococcus aureus (MRSA)    Hyperglycemia 10/23/2021   Hyperglycemia due to type 2 diabetes mellitus (HCC) 10/23/2021   Hyperkalemia    Hyperlipidemia 09/12/2015   Hyponatremia 10/23/2021   ICD (implantable cardioverter-defibrillator) in place 05/16/2021   Infection of great toe 10/23/2021   Intertrigo 09/20/2022   Lactic acidosis 10/23/2021   Leukocytosis 10/23/2021   Mediastinitis 06/28/2016   Morbid obesity (HCC) 05/08/2016   Myocardial infarction (HCC)    Needs flu shot 09/20/2022   NSTEMI (non-ST elevated myocardial infarction) (HCC) 05/22/2020   Obesity (BMI 30-39.9) 10/23/2021   Partial nontraumatic amputation of left foot (HCC) 09/20/2022   Perineal abscess 10/23/2021   Peripheral vascular disease (HCC)    S/P CABG (coronary artery bypass graft) 06/03/2016   Overview:  The patient underwent sternal reconstruction on 06/21/16 with pec flaps for mediastinitis  from a prior CABG in May 2017. On admission, she was critically ill from sepsis and had altered mental status. She was last seen in clinic on 08/09/16 at which time she was doing well.   Severe sepsis (HCC) 06/03/2016   Sinus tachycardia 05/08/2016   Tobacco use disorder 04/20/2016    Overview:  Quit in May 2017.   Type 2 diabetes mellitus with foot ulcer (HCC) 05/08/2016   Uncontrolled type 2 diabetes mellitus with hyperglycemia (HCC) 02/18/2024   Unstable angina (HCC) 05/22/2020   Ventricular tachycardia (HCC) 12/17/2020   Vitamin D  deficiency 09/20/2022   Wound, surgical, infected 06/06/2016   Overview:  sternal       Latest Ref Rng & Units 06/09/2024    5:06 PM 03/13/2024   10:36 AM 12/05/2023    8:26 AM  CBC  WBC 3.4 - 10.8 x10E3/uL 22.6  8.6  8.5   Hemoglobin 11.1 - 15.9 g/dL 88.1  86.8  86.7   Hematocrit 34.0 - 46.6 % 36.5  39.1  41.0   Platelets 150 - 450 x10E3/uL 403  291  254        Latest Ref Rng & Units 03/13/2024   10:36 AM 02/18/2024    4:10 PM 12/05/2023    8:26 AM  BMP  Glucose 70 - 99 mg/dL 793  736  809   BUN 8 - 27 mg/dL 17  19  24    Creatinine 0.57 - 1.00 mg/dL 9.03  9.04  8.82   BUN/Creat Ratio 12 - 28 18  20  21    Sodium 134 - 144 mmol/L 140  138  139   Potassium 3.5 - 5.2 mmol/L 4.7  5.7  4.9   Chloride 96 - 106 mmol/L 105  102  103   CO2 20 - 29 mmol/L 19  24  23    Calcium  8.7 - 10.3 mg/dL 9.5  89.6  89.8       Physical Exam: Lower Extremity Exam Right foot with erythema and edema of the heel Areas of what appear to be superficial abscess Pain on palpation of the heel  DP and PT pulses palpable Sensation diminished to toes Prior right partial first ray amputation well healed    Left foot: prior TMA  ASSESSMENT/PLAN OF CARE 63 y.o. female with PMHx significant for  DM2 with neropathy and prior partial first ray amputation  with right heel cellulitis and possible residual abscess/FB s/p bedside I&D and foreign body removal in office by Dr. Lamount.   WBC 22.5 ESR and CRP pending MRI R foot: ordered   - Recommend MRI R foot including the entire heel with contrast if possible. Further plans to follow pending MRI findings.  - Continue IV abx broad spectrum pending further culture data - Anticoagulation: Ok to continue  at this time - Wound care: Betadine  wet to dry dressing  - WB status: NWB due to pain RLE - Will continue to follow   Thank you for the consult.  Please contact me directly with any questions or concerns.           [name], DPM [facility]    [address], [place] [address]  Office [phone]  Fax [phone]

## 2024-06-10 NOTE — ED Provider Notes (Signed)
 [place] EMERGENCY DEPARTMENT AT [facility] Provider Note   CSN: [id number] Arrival date & time: 06/10/24  1159     Patient presents with: Wound Infection (Pt.,s  Dr. Rozetta from [facility] contacted pt. Today to come to ED for a rt. Heel infection.  )   Jasmine Richardson is a 63 y.o. female who presents to the emergency department with a chief complaint of right foot infection.  Patient was recently seen by urgent care as well as podiatry.  Patient was originally placed on doxycycline  and then switched to Bactrim  by podiatrist.  Of note on 06/01/2024 podiatry outpatient removed a foreign body from the heel of the patient contributing to the right heel cellulitis.  Of note patient also has a right fifth metatarsal spiral fracture being followed by podiatry outpatient.  Yesterday 06/05/2024 simple I&D performed by podiatry outpatient.  Patient is nonweightbearing on right foot at this time due to right fifth metatarsal spiral fracture.  Patient has past medical history significant for diastolic heart failure, coronary artery disease, hypertension, hyperlipidemia, obesity, status post CABG, type 2 diabetes, ICD in place, diabetic foot infection, peripheral vascular disease, proximal nontraumatic amputation of left foot, etc.    HPI     Prior to Admission medications   Medication Sig Start Date End Date Taking? Authorizing Provider  acetaminophen  (TYLENOL ) 325 MG tablet Take 2 tablets (650 mg total) by mouth every 4 (four) hours as needed for headache or mild pain. 10/14/21  Yes Samtani, Jai-Gurmukh, MD  amiodarone  (PACERONE ) 200 MG tablet Take 1 tablet (200 mg total) by mouth daily. 05/05/24  Yes Monetta Redell PARAS, MD  aspirin  81 MG chewable tablet Chew 81 mg by mouth daily.   Yes [provider]  clopidogrel  (PLAVIX ) 75 MG tablet Take 1 tablet (75 mg total) by mouth daily. 05/26/24  Yes Madireddy, Alean SAUNDERS, MD  Evolocumab  (REPATHA  SURECLICK) 140 MG/ML SOAJ Inject 140 mg  into the skin every 14 (fourteen) days. 03/13/24  Yes Nicholaus Credit, PA-C  FARXIGA  10 MG TABS tablet Take 1 tablet by mouth once daily 04/06/24  Yes Nicholaus Credit, PA-C  furosemide  (LASIX ) 20 MG tablet Take 1 tablet (20 mg total) by mouth daily. Patient taking differently: Take 20 mg by mouth every other day. 08/06/22  Yes Monetta Redell PARAS, MD  HUMALOG  KWIKPEN 100 UNIT/ML KwikPen Inject 10-50 Units into the skin at bedtime. Sliding scale 12/26/22  Yes Nicholaus Credit, PA-C  insulin  glargine, 1 Unit Dial , (TOUJEO  SOLOSTAR) 300 UNIT/ML Solostar Pen Inject 63 Units into the skin daily. 05/14/24  Yes Cox, Kirsten, MD  metFORMIN  (GLUCOPHAGE ) 500 MG tablet TAKE 1 TABLET BY MOUTH TWICE DAILY WITH A MEAL 01/30/24  Yes Nicholaus Credit, PA-C  metoprolol  succinate (TOPROL -XL) 25 MG 24 hr tablet TAKE 1 TABLET BY MOUTH IN THE MORNING, AT NOON AND AT BEDTIME 05/06/24  Yes Monetta Redell PARAS, MD  nystatin  (MYCOSTATIN /NYSTOP ) powder Apply 1 Application topically daily as needed (rash/yeast). 10/16/23  Yes Nicholaus Credit, PA-C  oxyCODONE  (ROXICODONE ) 5 MG immediate release tablet Take 1 tablet (5 mg total) by mouth every 6 (six) hours as needed for up to 10 doses for severe pain (pain score 7-10). 06/01/24  Yes Semon, Jake L, DPM  rosuvastatin  (CRESTOR ) 20 MG tablet Take 1 tablet (20 mg total) by mouth daily. 04/29/23  Yes Monetta Redell PARAS, MD  Semaglutide ,0.25 or 0.5MG /DOS, (OZEMPIC , 0.25 OR 0.5 MG/DOSE,) 2 MG/3ML SOPN Inject 0.25 mg into the skin once weekly for 4 weeks, then  increase to 0.5 mg once weekly 05/14/24 06/25/24 Yes Cox, Kirsten, MD  senna (SENOKOT) 8.6 MG tablet Take 1 tablet (8.6 mg total) by mouth daily for 10 days. May discontinue once symptoms resolve Patient taking differently: Take 1 tablet by mouth daily as needed for constipation. 06/09/24 06/19/24 Yes Semon, Jake L, DPM  sulfamethoxazole -trimethoprim  (BACTRIM  DS) 800-160 MG tablet Take 1 tablet by mouth 2 (two) times daily. 06/09/24  Yes Semon, Jake L, DPM  valsartan  (DIOVAN ) 80  MG tablet Take 1 tablet (80 mg total) by mouth 2 (two) times daily. 04/29/23  Yes Monetta Redell PARAS, MD  Vitamin D , Ergocalciferol , (DRISDOL ) 1.25 MG (50000 UNIT) CAPS capsule TAKE 1 CAPSULE BY MOUTH EVERY SATURDAY 03/30/24  Yes Nicholaus Credit, PA-C  Continuous Glucose Sensor (DEXCOM G7 SENSOR) MISC Change sensor every 10 days as directed 04/29/24   Nicholaus Credit, PA-C  Insulin  Pen Needle (INSUPEN PEN NEEDLES) 32G X 4 MM MISC Use as directed once daily with insulin . 05/15/24   Cox, Abigail, MD  mupirocin  ointment (BACTROBAN ) 2 % Apply 1 Application topically 2 (two) times daily. Patient not taking: Reported on 06/10/2024 02/05/24   Raspet, Erin K, PA-C  nitroGLYCERIN  (NITROSTAT ) 0.4 MG SL tablet Place 1 tablet (0.4 mg total) under the tongue every 5 (five) minutes as needed for chest pain. 09/20/22   Nicholaus Credit, PA-C    Allergies: Vancomycin , Chlorhexidine  gluconate, Cat dander, Other, Tramadol , and Codeine    Review of Systems  Skin:  Positive for rash (R heel).    Updated Vital Signs BP 136/85 (BP Location: Left Arm)   Pulse 79   Temp 98.7 F (37.1 C)   Resp 18   Ht 5' 7 (1.702 m)   Wt 99.3 kg   LMP  (LMP Unknown)   SpO2 96%   BMI 34.30 kg/m   Physical Exam Vitals and nursing note reviewed.  Constitutional:      General: She is awake. She is not in acute distress.    Appearance: Normal appearance. She is not ill-appearing, toxic-appearing or diaphoretic.  HENT:     Head: Normocephalic and atraumatic.   Eyes:     General: No scleral icterus.    Extraocular Movements: Extraocular movements intact.    Cardiovascular:     Rate and Rhythm: Normal rate and regular rhythm.     Pulses: Normal pulses.     Heart sounds: Normal heart sounds.  Pulmonary:     Effort: Pulmonary effort is normal.     Breath sounds: Normal breath sounds.   Musculoskeletal:        General: Normal range of motion.     Cervical back: Normal range of motion.     Right lower leg: No edema.     Left lower leg:  No edema.   Skin:    General: Skin is warm and dry.     Capillary Refill: Capillary refill takes less than 2 seconds.     Comments: Cellulitis present to R heel, foot wrapped and bandage in place from previous I+D    Neurological:     Mental Status: She is alert and oriented to person, place, and time.     Cranial Nerves: No cranial nerve deficit.     Sensory: Sensory deficit present.     Motor: No weakness.     Coordination: Coordination normal.   Psychiatric:        Mood and Affect: Mood normal.        Behavior: Behavior normal. Behavior is  cooperative.     (all labs ordered are listed, but only abnormal results are displayed) Labs Reviewed  CBC WITH DIFFERENTIAL/PLATELET - Abnormal; Notable for the following components:      Result Value   WBC 22.5 (*)    Hemoglobin 11.9 (*)    Platelets 433 (*)    Neutro Abs 19.3 (*)    Abs Immature Granulocytes 0.25 (*)    All other components within normal limits  COMPREHENSIVE METABOLIC PANEL WITH GFR - Abnormal; Notable for the following components:   Glucose, Bld 264 (*)    Creatinine, Ser 1.03 (*)    Albumin 2.7 (*)    AST 11 (*)    All other components within normal limits  SEDIMENTATION RATE - Abnormal; Notable for the following components:   Sed Rate 120 (*)    All other components within normal limits  C-REACTIVE PROTEIN - Abnormal; Notable for the following components:   CRP 11.2 (*)    All other components within normal limits  URINALYSIS, ROUTINE W REFLEX MICROSCOPIC - Abnormal; Notable for the following components:   Color, Urine AMBER (*)    APPearance CLOUDY (*)    Glucose, UA >=500 (*)    Hgb urine dipstick LARGE (*)    Protein, ur >=300 (*)    Nitrite POSITIVE (*)    Bacteria, UA MANY (*)    All other components within normal limits  GLUCOSE, CAPILLARY - Abnormal; Notable for the following components:   Glucose-Capillary 226 (*)    All other components within normal limits  CULTURE, BLOOD (ROUTINE X 2)   CULTURE, BLOOD (ROUTINE X 2)  SURGICAL PCR SCREEN  HIV ANTIBODY (ROUTINE TESTING W REFLEX)  BASIC METABOLIC PANEL WITH GFR  CBC  I-STAT CG4 LACTIC ACID, ED  I-STAT CG4 LACTIC ACID, ED    EKG: None  Radiology: DG Foot Complete Right Result Date: 06/09/2024 Please see detailed radiograph report in office note.    Procedures   Medications Ordered in the ED  linezolid  (ZYVOX ) IVPB 600 mg (600 mg Intravenous New Bag/Given 06/10/24 2031)  ceFEPIme  (MAXIPIME ) 2 g in sodium chloride  0.9 % 100 mL IVPB (has no administration in time range)  acetaminophen  (TYLENOL ) tablet 650 mg (has no administration in time range)  aspirin  chewable tablet 81 mg (81 mg Oral Given 06/10/24 2032)  furosemide  (LASIX ) tablet 20 mg (20 mg Oral Given 06/10/24 2033)  nitroGLYCERIN  (NITROSTAT ) SL tablet 0.4 mg (has no administration in time range)  rosuvastatin  (CRESTOR ) tablet 20 mg (20 mg Oral Given 06/10/24 2032)  dapagliflozin  propanediol (FARXIGA ) tablet 10 mg (10 mg Oral Given 06/10/24 2033)  amiodarone  (PACERONE ) tablet 200 mg (200 mg Oral Given 06/10/24 2033)  metoprolol  succinate (TOPROL -XL) 24 hr tablet 12.5 mg (12.5 mg Oral Given 06/10/24 2033)  oxyCODONE  (Oxy IR/ROXICODONE ) immediate release tablet 5 mg (5 mg Oral Given 06/10/24 2032)  senna (SENOKOT) tablet 8.6 mg (8.6 mg Oral Given 06/10/24 2033)  irbesartan  (AVAPRO ) tablet 75 mg (75 mg Oral Given 06/10/24 2032)  heparin  injection 5,000 Units (5,000 Units Subcutaneous Given 06/10/24 2252)  hydrALAZINE  (APRESOLINE ) injection 5 mg (has no administration in time range)  insulin  aspart (novoLOG ) injection 0-9 Units (has no administration in time range)  insulin  aspart (novoLOG ) injection 0-5 Units (2 Units Subcutaneous Given 06/10/24 2258)  insulin  glargine-yfgn (SEMGLEE ) injection 50 Units (has no administration in time range)  mupirocin  ointment (BACTROBAN ) 2 % 1 Application (1 Application Nasal Given 06/10/24 2259)  acetaminophen  (TYLENOL ) tablet 650 mg (650  mg  Oral Given 06/10/24 1351)  ceFEPIme  (MAXIPIME ) 2 g in sodium chloride  0.9 % 100 mL IVPB (2 g Intravenous New Bag/Given 06/10/24 1907)                                    Medical Decision Making Amount and/or Complexity of Data Reviewed Labs: ordered.  Risk Decision regarding hospitalization.   Patient presents to the ED for concern of R foot cellulitis, this involves an extensive number of treatment options, and is a complaint that carries with it a high risk of complications and morbidity.  The differential diagnosis includes cellulitis, foreign body, sepsis, osteomyelitis, etc.    Co morbidities that complicate the patient evaluation  Diastolic heart failure, coronary artery disease, hypertension, hyperlipidemia, obesity, status post CABG, type 2 diabetes, history of diabetic foot ulcers, history of sepsis, history of ventricular tachycardia, history of toe amputation, Tronic osteomyelitis of left foot, history of MRSA infection   Additional history obtained:  Additional history obtained from Family and Outside Medical Records   External records from outside source obtained and reviewed including podiatry notes including antibiotic choice, outpatient lab work, incision and drainage, removal of foreign body   Lab Tests:  I Ordered, and personally interpreted labs.  The pertinent results include: CBC-white blood cell count 22.5, CMP-glucose 264, CRP 11.2, ESR 120   Imaging Studies ordered:  I ordered imaging studies including MRI right foot with/without contrast I independently visualized and interpreted imaging which showed -pending at time of admission I agree with the radiologist interpretation   Medicines ordered and prescription drug management:  I have reviewed the patients home medicines and have made adjustments as needed   Test Considered:  X-ray of right foot: Declined at this time as patient has recently had x-ray imaging, will wait for MRI and inpatient  admission   Critical Interventions:  none   Consultations Obtained:  I requested consultation with the hospitalist,  and discussed lab and imaging findings as well as pertinent plan - they recommend: Admission for ongoing diagnosis and treatment   Problem List / ED Course:  63 year old female, right foot cellulitis, extensive past medical history including uncontrolled type 2 diabetes, history of diabetic foot infections Being followed by podiatry outpatient, doxycycline  as well as Bactrim  tried without improvement Foreign body removal was performed, piece of glass removed out of right heel by podiatry outpatient, incision and drainage also White blood cell count 22.5, elevated ESR and CRP, call made to hospitalist for admission for inpatient antibiotics MRI right foot ordered, will happen inpatient, of note patient does have a cardiac defibrillator Patient admitted Most likely diagnosis is worsening cellulitis and soft tissue infection due to foreign body removed out of right heel.  Patient does not appear septic at this time.  Vital signs stable.  Lactic acid in the emergency department unremarkable.  Blood cultures drawn.  Patient also states that she feels like she may have a urinary tract infection, urinalysis ordered.   Reevaluation:  After the interventions noted above, I reevaluated the patient and found that they have :stayed the same   Social Determinants of Health:  none   Dispostion:  After consideration of the diagnostic results and the patients response to treatment, I feel that the patent would benefit from admission to the hospital for further diagnosis and treatment.     Final diagnoses:  Cellulitis of right foot    ED Discharge Orders  None          Jasmine Richardson JULIANNA DEVONNA 06/10/24 2357    Randol Simmonds, MD 06/11/24 [id number]

## 2024-06-11 ENCOUNTER — Inpatient Hospital Stay (HOSPITAL_COMMUNITY)

## 2024-06-11 DIAGNOSIS — E669 Obesity, unspecified: Secondary | ICD-10-CM

## 2024-06-11 DIAGNOSIS — L089 Local infection of the skin and subcutaneous tissue, unspecified: Secondary | ICD-10-CM | POA: Diagnosis not present

## 2024-06-11 DIAGNOSIS — Z95810 Presence of automatic (implantable) cardiac defibrillator: Secondary | ICD-10-CM

## 2024-06-11 DIAGNOSIS — I472 Ventricular tachycardia, unspecified: Secondary | ICD-10-CM

## 2024-06-11 DIAGNOSIS — E1165 Type 2 diabetes mellitus with hyperglycemia: Secondary | ICD-10-CM

## 2024-06-11 DIAGNOSIS — L02611 Cutaneous abscess of right foot: Secondary | ICD-10-CM | POA: Diagnosis not present

## 2024-06-11 DIAGNOSIS — Z951 Presence of aortocoronary bypass graft: Secondary | ICD-10-CM

## 2024-06-11 DIAGNOSIS — F17200 Nicotine dependence, unspecified, uncomplicated: Secondary | ICD-10-CM

## 2024-06-11 LAB — BASIC METABOLIC PANEL WITH GFR
Anion gap: 13 (ref 5–15)
BUN: 20 mg/dL (ref 8–23)
CO2: 19 mmol/L — ABNORMAL LOW (ref 22–32)
Calcium: 8.9 mg/dL (ref 8.9–10.3)
Chloride: 103 mmol/L (ref 98–111)
Creatinine, Ser: 1.24 mg/dL — ABNORMAL HIGH (ref 0.44–1.00)
GFR, Estimated: 49 mL/min — ABNORMAL LOW (ref >60)
Glucose, Bld: 178 mg/dL — ABNORMAL HIGH (ref 70–99)
Potassium: 4.3 mmol/L (ref 3.5–5.1)
Sodium: 135 mmol/L (ref 135–145)

## 2024-06-11 LAB — SURGICAL PCR SCREEN
MRSA, PCR: NEGATIVE
Staphylococcus aureus: NEGATIVE

## 2024-06-11 LAB — CBC
HCT: 32.6 % — ABNORMAL LOW (ref 36.0–46.0)
Hemoglobin: 10.4 g/dL — ABNORMAL LOW (ref 12.0–15.0)
MCH: 28.5 pg (ref 26.0–34.0)
MCHC: 31.9 g/dL (ref 30.0–36.0)
MCV: 89.3 fL (ref 80.0–100.0)
Platelets: 378 10*3/uL (ref 150–400)
RBC: 3.65 MIL/uL — ABNORMAL LOW (ref 3.87–5.11)
RDW: 13.3 % (ref 11.5–15.5)
WBC: 17 10*3/uL — ABNORMAL HIGH (ref 4.0–10.5)
nRBC: 0 % (ref 0.0–0.2)

## 2024-06-11 LAB — CULTURE, BLOOD (ROUTINE X 2)

## 2024-06-11 LAB — GLUCOSE, CAPILLARY
Glucose-Capillary: 169 mg/dL — ABNORMAL HIGH (ref 70–99)
Glucose-Capillary: 144 mg/dL — ABNORMAL HIGH (ref 70–99)
Glucose-Capillary: 122 mg/dL — ABNORMAL HIGH (ref 70–99)
Glucose-Capillary: 157 mg/dL — ABNORMAL HIGH (ref 70–99)

## 2024-06-11 LAB — WOUND CULTURE

## 2024-06-11 LAB — HEMOGLOBIN A1C
Hgb A1c MFr Bld: 8.9 % — ABNORMAL HIGH (ref 4.8–5.6)
Mean Plasma Glucose: 208.73 mg/dL

## 2024-06-11 LAB — HIV ANTIBODY (ROUTINE TESTING W REFLEX): HIV Screen 4th Generation wRfx: NONREACTIVE

## 2024-06-11 MED ORDER — GADOBUTROL 1 MMOL/ML IV SOLN
10.0000 mL | Freq: Once | INTRAVENOUS | Status: AC | PRN
  Administered 2024-06-11: 10 mL via INTRAVENOUS

## 2024-06-11 MED ORDER — INSULIN ASPART 100 UNIT/ML IJ SOLN
3.0000 [IU] | Freq: Three times a day (TID) | INTRAMUSCULAR | Status: DC
  Administered 2024-06-13 – 2024-06-14 (×4): 3 [IU] via SUBCUTANEOUS

## 2024-06-11 MED ORDER — LORAZEPAM 2 MG/ML IJ SOLN: 0.5000 mg | Freq: Once | INTRAMUSCULAR | Status: DC | PRN

## 2024-06-11 MED ORDER — SODIUM CHLORIDE 0.9 % IV SOLN
2.0000 g | Freq: Two times a day (BID) | INTRAVENOUS | Status: DC
  Administered 2024-06-11 – 2024-06-12 (×3): 2 g via INTRAVENOUS
  Filled 2024-06-11 (×3): qty 12.5

## 2024-06-11 MED ORDER — INSULIN ASPART 100 UNIT/ML IJ SOLN
0.0000 [IU] | Freq: Three times a day (TID) | INTRAMUSCULAR | Status: DC
  Administered 2024-06-11 (×2): 2 [IU] via SUBCUTANEOUS
  Administered 2024-06-12 – 2024-06-13 (×3): 3 [IU] via SUBCUTANEOUS
  Administered 2024-06-13: 2 [IU] via SUBCUTANEOUS
  Administered 2024-06-14 (×2): 3 [IU] via SUBCUTANEOUS

## 2024-06-11 MED ORDER — INSULIN ASPART 100 UNIT/ML IJ SOLN: 0.0000 [IU] | Freq: Three times a day (TID) | INTRAMUSCULAR | Status: DC

## 2024-06-11 MED ORDER — INSULIN ASPART 100 UNIT/ML IJ SOLN: 3.0000 [IU] | Freq: Three times a day (TID) | INTRAMUSCULAR | Status: DC

## 2024-06-11 NOTE — Progress Notes (Signed)
   PODIATRY PROGRESS NOTE Patient Name: Jasmine Richardson  DOB 10-10-1961 DOA 06/10/2024  Hospital Day: 2  Assessment:  63 y.o. female with PMHx significant for  DM2 with neropathy and prior partial first ray amputation  with right heel cellulitis and possible residual abscess/FB s/p bedside I&D and foreign body removal in office by Dr. Lamount.    WBC 22.5 ESR and CRP 120, 11.2 MRI R foot W WO contrast: ordered - pt has implanted cardiac device    Plan:  - Recommend MRI R foot including the entire heel with contrast if possible. Further plans to follow pending MRI findings.  Please attempt to have MRI done today so that if needed, surgery could proceed tomorrow.   - Continue IV abx broad spectrum pending further culture data - Anticoagulation: Ok to continue at this time - Wound care: Betadine  wet to dry dressing  - WB status: NWB due to pain RLE - Will continue to follow        Jasmine Richardson, DPM [facility]    Subjective:  Discussed with patient re plan for MRI prior to surgical plan, she is aware   Objective:   Vitals:   06/10/24 2014 06/11/24 0414  BP: 136/85 104/64  Pulse: 79 73  Resp: 18 17  Temp: 98.7 F (37.1 C) 98.2 F (36.8 C)  SpO2: 96% 99%       Latest Ref Rng & Units 06/10/2024    1:11 PM 06/09/2024    5:06 PM 03/13/2024   10:36 AM  CBC  WBC 4.0 - 10.5 K/uL 22.5  22.6  8.6   Hemoglobin 12.0 - 15.0 g/dL 88.0  88.1  86.8   Hematocrit 36.0 - 46.0 % 36.6  36.5  39.1   Platelets 150 - 400 K/uL 433  403  291        Latest Ref Rng & Units 06/10/2024    1:11 PM 03/13/2024   10:36 AM 02/18/2024    4:10 PM  BMP  Glucose 70 - 99 mg/dL 735  793  736   BUN 8 - 23 mg/dL 17  17  19    Creatinine 0.44 - 1.00 mg/dL 8.96  9.03  9.04   BUN/Creat Ratio 12 - 28  18  20    Sodium 135 - 145 mmol/L 137  140  138   Potassium 3.5 - 5.1 mmol/L 4.4  4.7  5.7   Chloride 98 - 111 mmol/L 103  105  102   CO2 22 - 32 mmol/L 22  19  24    Calcium  8.9 - 10.3 mg/dL 9.6   9.5  89.6     General: AAOx3, NAD  Lower Extremity Exam Right foot with erythema and edema of the heel - erythema may be slightly improved today Areas of what appear to be superficial abscess Pain on palpation of the heel   DP and PT pulses palpable Sensation diminished to toes Prior right partial first ray amputation well healed      Radiology:  Results reviewed. See assessment for pertinent imaging results

## 2024-06-11 NOTE — Plan of Care (Signed)

## 2024-06-11 NOTE — Progress Notes (Addendum)
 PROGRESS NOTE  Jasmine Richardson FMW:[id number] DOB: Jan 12, 1961   PCP: Nicholaus Credit, PA-C  Patient is from: Home.  Uses rolling walker and cane at baseline.  DOA: 06/10/2024 LOS: 1  Chief complaints Chief Complaint  Patient presents with   Wound Infection    Pt.,s  Dr. Rozetta from [facility] contacted pt. Today to come to ED for a rt. Heel infection.       Brief Narrative / Interim history: 63 year old F with PMH of CAD/CABG in 2017 and PCI in 2021, HFpEF, VT s/p AICD, DM-2, PVD, obesity, HTN, HLD, neuropathy, left TMA amputation and right great toe amputation sent to ED from podiatry office due to right foot abscess.  Patient had a recent right heel wound after she stepped on a piece of glass . She saw podiatry on 6/16 and then 6/24.  Reportedly, small piece of glass was removed.  Continues to have worsening leukocytosis, pain and swelling despite 2 rounds of antibiotics with doxycycline  and then Bactrim .  She had I&D at podiatry office with removal of purulent material and sent to ED for further evaluation.  In ED, vital stable.  Leukocytosis to 22.5 with left shift.  CRP 11.2.  ESR 120.  Lactic acid negative.  Started on cefepime  and Zyvox .  MRI foot ordered.  Orthopedic surgery consulted.  Subjective: Seen and examined earlier this morning.  No major events overnight or this morning.  Continues to endorse significant pain in her right foot.  Reports improvement in redness and swelling.  Denies chest pain, shortness of breath and GI symptoms.  She states she had some UTI symptoms such as dysuria and frequency that has resolved after IV antibiotics.  Objective: Vitals:   06/10/24 1825 06/10/24 2014 06/11/24 0414 06/11/24 0725  BP: (!) 149/75 136/85 104/64 135/77  Pulse: 77 79 73 74  Resp: 16 18 17    Temp: 98.1 F (36.7 C) 98.7 F (37.1 C) 98.2 F (36.8 C) 98 F (36.7 C)  TempSrc: Oral     SpO2: 100% 96% 99% 99%  Weight:      Height:        Examination:  GENERAL: No  apparent distress.  Nontoxic. HEENT: MMM.  Vision and hearing grossly intact.  NECK: Supple.  No apparent JVD.  RESP:  No IWOB.  Fair aeration bilaterally. CVS:  RRR. Heart sounds normal.  ABD/GI/GU: BS+. Abd soft, NTND.  MSK/EXT:  S/p previous left TMA amputation.  Previous right great toe amputation.  Swelling and erythema over medial aspect of right foot.  Left heel swelling with central wound.  See picture under media for more. SKIN: no apparent skin lesion or wound NEURO: AA.  Oriented appropriately.  No apparent focal neuro deficit. PSYCH: Calm. Normal affect.   Consultants:  Podiatry  Procedures: 6/24-I&D at podiatry office  Microbiology summarized: 6/24-abscess culture with group B strep's Blood cultures NGTD MRSA PCR screen nonreactive  Assessment and plan: Right foot infection, with abscess: Reportedly stepped on a piece of glass does seem to have been removed by podiatry outpatient.  Presents with worsening swelling, pain.  Significant leukocytosis with markedly elevated inflammatory markers -S/p bedside I&D at podiatry office.  Abscess culture with group B strep. -Still with tenderness, swelling and erythema -Continue Zyvox  and cefepime  pending MRI and definitive management by podiatry -Pain and glycemic control   Chronic diastolic CHF: TTE in 02/2023 with LVEF of 60 to 65%.  Appears euvolemic on exam. -Continue with home Lasix , metoprolol , Farxiga  and valsartan  - Monitor  fluid status   History of CAD s/p CABG in 2017 and RCA PCI in 05/2020: No cardiopulmonary symptoms. -Continue holding Plavix  for possible need of surgical intervention -Continue with statin and aspirin . -Continue with valsartan  and metoprolol    History of ventricular tachycardia status post AICD -Continue with amiodarone   Hypertension: Normotensive. - Continue home regimen - Hydralazine  as needed   IDDM-2 with hyperglycemia: A1c 9.4% on 02/18/2024.  On Toujeo  63 units daily, metformin , Ozempic   and Farxiga  at home. Recent Labs  Lab 06/10/24 2117 06/11/24 0630  GLUCAP 226* 169*  - Continue home Farxiga  -Continue Semglee  50 units daily -Increase SSI to moderate.  She may need resistant scale -Add NovoLog  3 units 3 times daily with meals - Recheck hemoglobin A1c  UTI: Reports some UTI symptoms including dysuria and frequency before coming to ED.  UA concerning.  Urine culture was not sent.  UTI symptoms resolving. - Should be covered with IV cefepime .  Hyperlipidemia: On Repatha  outpatient. - Continue Crestor   Obesity class I Body mass index is 34.3 kg/m. - Continue home Ozempic            DVT prophylaxis:  heparin  injection 5,000 Units Start: 06/10/24 2200  Code Status: Full code Family Communication: None at bedside Level of care: Med-Surg Status is: Inpatient Remains inpatient appropriate because: Right foot infection   Final disposition: To be determined   55 minutes with more than 50% spent in reviewing records, counseling patient/family and coordinating care.   Sch Meds:  Scheduled Meds:  amiodarone   200 mg Oral Daily   aspirin   81 mg Oral Daily   dapagliflozin  propanediol  10 mg Oral Daily   furosemide   20 mg Oral Daily   heparin   5,000 Units Subcutaneous Q8H   insulin  aspart  0-5 Units Subcutaneous QHS   insulin  aspart  0-9 Units Subcutaneous TID WC   insulin  glargine-yfgn  50 Units Subcutaneous Daily   irbesartan   75 mg Oral Daily   metoprolol  succinate  12.5 mg Oral Daily   rosuvastatin   20 mg Oral Daily   senna  1 tablet Oral Daily   Continuous Infusions:  ceFEPime  (MAXIPIME ) IV     linezolid  (ZYVOX ) IV 600 mg (06/11/24 0835)   PRN Meds:.acetaminophen , hydrALAZINE , nitroGLYCERIN , oxyCODONE   Antimicrobials: Anti-infectives (From admission, onward)    Start     Dose/Rate Route Frequency Ordered Stop   06/11/24 1430  ceFEPIme  (MAXIPIME ) 2 g in sodium chloride  0.9 % 100 mL IVPB        2 g 200 mL/hr over 30 Minutes Intravenous Every  12 hours 06/11/24 0912 06/18/24 1429   06/11/24 0230  ceFEPIme  (MAXIPIME ) 2 g in sodium chloride  0.9 % 100 mL IVPB  Status:  Discontinued        2 g 200 mL/hr over 30 Minutes Intravenous Every 8 hours 06/10/24 1821 06/11/24 0912   06/10/24 2000  linezolid  (ZYVOX ) IVPB 600 mg        600 mg 300 mL/hr over 60 Minutes Intravenous Every 12 hours 06/10/24 1756     06/10/24 1830  ceFEPIme  (MAXIPIME ) 2 g in sodium chloride  0.9 % 100 mL IVPB        2 g 200 mL/hr over 30 Minutes Intravenous  Once 06/10/24 1755 06/10/24 1937        I have personally reviewed the following labs and images: CBC: Recent Labs  Lab 06/09/24 1706 06/10/24 1311 06/11/24 0631  WBC 22.6* 22.5* 17.0*  NEUTROABS 19.0* 19.3*  --   HGB 11.8  11.9* 10.4*  HCT 36.5 36.6 32.6*  MCV 89 89.1 89.3  PLT 403 433* 378   BMP &GFR Recent Labs  Lab 06/10/24 1311 06/11/24 0631  NA 137 135  K 4.4 4.3  CL 103 103  CO2 22 19*  GLUCOSE 264* 178*  BUN 17 20  CREATININE 1.03* 1.24*  CALCIUM  9.6 8.9   Estimated Creatinine Clearance: 56.2 mL/min (A) (by C-G formula based on SCr of 1.24 mg/dL (H)). Liver & Pancreas: Recent Labs  Lab 06/10/24 1311  AST 11*  ALT 11  ALKPHOS 55  BILITOT 0.5  PROT 8.0  ALBUMIN 2.7*   No results for input(s): LIPASE, AMYLASE in the last 168 hours. No results for input(s): AMMONIA in the last 168 hours. Diabetic: No results for input(s): HGBA1C in the last 72 hours. Recent Labs  Lab 06/10/24 2117 06/11/24 0630  GLUCAP 226* 169*   Cardiac Enzymes: No results for input(s): CKTOTAL, CKMB, CKMBINDEX, TROPONINI in the last 168 hours. No results for input(s): PROBNP in the last 8760 hours. Coagulation Profile: No results for input(s): INR, PROTIME in the last 168 hours. Thyroid Function Tests: No results for input(s): TSH, T4TOTAL, FREET4, T3FREE, THYROIDAB in the last 72 hours. Lipid Profile: No results for input(s): CHOL, HDL, LDLCALC, TRIG,  CHOLHDL, LDLDIRECT in the last 72 hours. Anemia Panel: No results for input(s): VITAMINB12, FOLATE, FERRITIN, TIBC, IRON, RETICCTPCT in the last 72 hours. Urine analysis:    Component Value Date/Time   COLORURINE AMBER (A) 06/10/2024 1830   APPEARANCEUR CLOUDY (A) 06/10/2024 1830   LABSPEC 1.029 06/10/2024 1830   PHURINE 5.0 06/10/2024 1830   GLUCOSEU >=500 (A) 06/10/2024 1830   HGBUR LARGE (A) 06/10/2024 1830   BILIRUBINUR NEGATIVE 06/10/2024 1830   BILIRUBINUR negative 10/16/2023 1516   KETONESUR NEGATIVE 06/10/2024 1830   PROTEINUR >=300 (A) 06/10/2024 1830   UROBILINOGEN negative (A) 10/16/2023 1516   NITRITE POSITIVE (A) 06/10/2024 1830   LEUKOCYTESUR NEGATIVE 06/10/2024 1830   Sepsis Labs: Invalid input(s): PROCALCITONIN, LACTICIDVEN  Microbiology: Recent Results (from the past 240 hours)  WOUND CULTURE     Status: Abnormal (Preliminary result)   Collection Time: 06/09/24  3:58 PM   Specimen: Foot, Right; Wound   Wound  Result Value Ref Range Status   Gram Stain Result Final report  Final   Organism ID, Bacteria Comment  Final    Comment: No white blood cells seen.   Organism ID, Bacteria Comment  Final    Comment: Moderate number of gram positive cocci.   Aerobic Bacterial Culture Preliminary report (A)  Preliminary   Organism ID, Bacteria Comment (A)  Preliminary    Comment: Beta hemolytic Streptococcus, group B Heavy growth Penicillin and ampicillin are drugs of choice for treatment of beta-hemolytic streptococcal infections. Susceptibility testing of penicillins and other beta-lactam agents approved by the FDA for treatment of beta-hemolytic streptococcal infections need not be performed routinely because nonsusceptible isolates are extremely rare in any beta-hemolytic streptococcus and have not been reported for Streptococcus pyogenes (group A). (CLSI)   Blood culture (routine x 2)     Status: None (Preliminary result)   Collection Time:  06/10/24  1:11 PM   Specimen: BLOOD  Result Value Ref Range Status   Specimen Description BLOOD RIGHT ANTECUBITAL  Final   Special Requests   Final    BOTTLES DRAWN AEROBIC AND ANAEROBIC Blood Culture results may not be optimal due to an inadequate volume of blood received in culture bottles   Culture  Final    NO GROWTH < 24 HOURS Performed at [facility], [address], [place], [place] [address]    Report Status PENDING  Incomplete  Blood culture (routine x 2)     Status: None (Preliminary result)   Collection Time: 06/10/24  6:41 PM   Specimen: BLOOD RIGHT ARM  Result Value Ref Range Status   Specimen Description BLOOD RIGHT ARM  Final   Special Requests   Final    BOTTLES DRAWN AEROBIC AND ANAEROBIC Blood Culture results may not be optimal due to an inadequate volume of blood received in culture bottles   Culture   Final    NO GROWTH < 24 HOURS Performed at [facility], [address] [place], [place] [address]    Report Status PENDING  Incomplete  Surgical PCR screen     Status: None   Collection Time: 06/10/24 11:59 PM   Specimen: Nasal Mucosa; Nasal Swab  Result Value Ref Range Status   MRSA, PCR NEGATIVE NEGATIVE Final   Staphylococcus aureus NEGATIVE NEGATIVE Final    Comment: (NOTE) The Xpert SA Assay (FDA approved for NASAL specimens in patients 8 years of age and older), is one component of a comprehensive surveillance program. It is not intended to diagnose infection nor to guide or monitor treatment. Performed at [facility], [address] [place], [place] [address]     Radiology Studies: No results found.    Dakota Stangl T. Bettylou Frew [facility]  If 7PM-7AM, please contact night-coverage [email or web address] 06/11/2024, 11:46 AM

## 2024-06-12 ENCOUNTER — Inpatient Hospital Stay (HOSPITAL_COMMUNITY): Admitting: Registered Nurse

## 2024-06-12 ENCOUNTER — Encounter (HOSPITAL_COMMUNITY): Payer: Self-pay | Admitting: Internal Medicine

## 2024-06-12 ENCOUNTER — Inpatient Hospital Stay (HOSPITAL_COMMUNITY)

## 2024-06-12 ENCOUNTER — Encounter (HOSPITAL_COMMUNITY)
Admission: EM | Disposition: A | Discharge status: Home Health | Payer: Self-pay | Source: Home / Self Care | Attending: Student

## 2024-06-12 DIAGNOSIS — Z951 Presence of aortocoronary bypass graft: Secondary | ICD-10-CM | POA: Diagnosis not present

## 2024-06-12 DIAGNOSIS — M71071 Abscess of bursa, right ankle and foot: Secondary | ICD-10-CM

## 2024-06-12 DIAGNOSIS — L089 Local infection of the skin and subcutaneous tissue, unspecified: Secondary | ICD-10-CM | POA: Diagnosis not present

## 2024-06-12 DIAGNOSIS — Z95810 Presence of automatic (implantable) cardiac defibrillator: Secondary | ICD-10-CM | POA: Diagnosis not present

## 2024-06-12 DIAGNOSIS — L02611 Cutaneous abscess of right foot: Secondary | ICD-10-CM | POA: Diagnosis not present

## 2024-06-12 HISTORY — DX: Abscess of bursa, right ankle and foot: M71.071

## 2024-06-12 HISTORY — PX: IRRIGATION AND DEBRIDEMENT ABSCESS: SHX5252

## 2024-06-12 LAB — RENAL FUNCTION PANEL
Albumin: 2.3 g/dL — ABNORMAL LOW (ref 3.5–5.0)
Anion gap: 7 (ref 5–15)
BUN: 18 mg/dL (ref 8–23)
CO2: 23 mmol/L (ref 22–32)
Calcium: 9 mg/dL (ref 8.9–10.3)
Chloride: 103 mmol/L (ref 98–111)
Creatinine, Ser: 1.08 mg/dL — ABNORMAL HIGH (ref 0.44–1.00)
GFR, Estimated: 58 mL/min — ABNORMAL LOW (ref >60)
Glucose, Bld: 138 mg/dL — ABNORMAL HIGH (ref 70–99)
Phosphorus: 3.1 mg/dL (ref 2.5–4.6)
Potassium: 4.3 mmol/L (ref 3.5–5.1)
Sodium: 133 mmol/L — ABNORMAL LOW (ref 135–145)

## 2024-06-12 LAB — CBC
HCT: 33.2 % — ABNORMAL LOW (ref 36.0–46.0)
Hemoglobin: 10.6 g/dL — ABNORMAL LOW (ref 12.0–15.0)
MCH: 28.3 pg (ref 26.0–34.0)
MCHC: 31.9 g/dL (ref 30.0–36.0)
MCV: 88.5 fL (ref 80.0–100.0)
Platelets: 372 10*3/uL (ref 150–400)
RBC: 3.75 MIL/uL — ABNORMAL LOW (ref 3.87–5.11)
RDW: 13.2 % (ref 11.5–15.5)
WBC: 15.9 10*3/uL — ABNORMAL HIGH (ref 4.0–10.5)
nRBC: 0 % (ref 0.0–0.2)

## 2024-06-12 LAB — GLUCOSE, CAPILLARY
Glucose-Capillary: 157 mg/dL — ABNORMAL HIGH (ref 70–99)
Glucose-Capillary: 136 mg/dL — ABNORMAL HIGH (ref 70–99)
Glucose-Capillary: 118 mg/dL — ABNORMAL HIGH (ref 70–99)
Glucose-Capillary: 120 mg/dL — ABNORMAL HIGH (ref 70–99)
Glucose-Capillary: 131 mg/dL — ABNORMAL HIGH (ref 70–99)
Glucose-Capillary: 139 mg/dL — ABNORMAL HIGH (ref 70–99)

## 2024-06-12 LAB — WOUND CULTURE

## 2024-06-12 LAB — SEDIMENTATION RATE: Sed Rate: 125 mm/h — ABNORMAL HIGH (ref 0–22)

## 2024-06-12 LAB — MAGNESIUM: Magnesium: 2 mg/dL (ref 1.7–2.4)

## 2024-06-12 LAB — C-REACTIVE PROTEIN: CRP: 11.6 mg/dL — ABNORMAL HIGH (ref <1.0)

## 2024-06-12 SURGERY — IRRIGATION AND DEBRIDEMENT ABSCESS
Anesthesia: Monitor Anesthesia Care | Site: Foot | Laterality: Right

## 2024-06-12 MED ORDER — PROPOFOL 500 MG/50ML IV EMUL
INTRAVENOUS | Status: DC | PRN
  Administered 2024-06-12: 125 ug/kg/min via INTRAVENOUS

## 2024-06-12 MED ORDER — 0.9 % SODIUM CHLORIDE (POUR BTL) OPTIME
TOPICAL | Status: DC | PRN
  Administered 2024-06-12: 1000 mL

## 2024-06-12 MED ORDER — BUPIVACAINE HCL (PF) 0.5 % IJ SOLN
INTRAMUSCULAR | Status: AC
  Filled 2024-06-12: qty 10

## 2024-06-12 MED ORDER — LACTATED RINGERS IV SOLN: INTRAVENOUS | Status: DC

## 2024-06-12 MED ORDER — LIDOCAINE 2% (20 MG/ML) 5 ML SYRINGE
INTRAMUSCULAR | Status: AC
Start: 2024-06-12 — End: 2024-06-12
  Filled 2024-06-12: qty 5

## 2024-06-12 MED ORDER — SODIUM CHLORIDE 0.9 % IV SOLN
3.0000 g | Freq: Four times a day (QID) | INTRAVENOUS | Status: DC
  Administered 2024-06-13 – 2024-06-16 (×15): 3 g via INTRAVENOUS
  Filled 2024-06-12 (×16): qty 8

## 2024-06-12 MED ORDER — ONDANSETRON HCL 4 MG/2ML IJ SOLN
INTRAMUSCULAR | Status: DC | PRN
  Administered 2024-06-12: 4 mg via INTRAVENOUS

## 2024-06-12 MED ORDER — PROPOFOL 10 MG/ML IV BOLUS
INTRAVENOUS | Status: DC | PRN
  Administered 2024-06-12: 50 mg via INTRAVENOUS

## 2024-06-12 MED ORDER — ONDANSETRON HCL 4 MG/2ML IJ SOLN
INTRAMUSCULAR | Status: AC
Start: 2024-06-12 — End: 2024-06-12
  Filled 2024-06-12: qty 2

## 2024-06-12 MED ORDER — FENTANYL CITRATE (PF) 100 MCG/2ML IJ SOLN: 25.0000 ug | INTRAMUSCULAR | Status: DC | PRN

## 2024-06-12 MED ORDER — PROPOFOL 10 MG/ML IV BOLUS
INTRAVENOUS | Status: AC
  Filled 2024-06-12: qty 20

## 2024-06-12 MED ORDER — SODIUM CHLORIDE 0.9 % IR SOLN
Status: DC | PRN
  Administered 2024-06-12: 3000 mL

## 2024-06-12 MED ORDER — LIDOCAINE HCL (PF) 1 % IJ SOLN
INTRAMUSCULAR | Status: AC
  Filled 2024-06-12: qty 5

## 2024-06-12 MED ORDER — CHLORHEXIDINE GLUCONATE 0.12 % MT SOLN: 15.0000 mL | Freq: Once | OROMUCOSAL | Status: DC

## 2024-06-12 MED ORDER — PHENYLEPHRINE 80 MCG/ML (10ML) SYRINGE FOR IV PUSH (FOR BLOOD PRESSURE SUPPORT)
PREFILLED_SYRINGE | INTRAVENOUS | Status: DC | PRN
  Administered 2024-06-12: 80 ug via INTRAVENOUS

## 2024-06-12 MED ORDER — MIDAZOLAM HCL 2 MG/2ML IJ SOLN
INTRAMUSCULAR | Status: AC
  Filled 2024-06-12: qty 2

## 2024-06-12 MED ORDER — ACETAMINOPHEN 10 MG/ML IV SOLN
1000.0000 mg | Freq: Once | INTRAVENOUS | Status: DC | PRN
Start: 2024-06-12 — End: 2024-06-12

## 2024-06-12 MED ORDER — ORAL CARE MOUTH RINSE: 15.0000 mL | Freq: Once | OROMUCOSAL | Status: DC

## 2024-06-12 MED ORDER — GADOBUTROL 1 MMOL/ML IV SOLN
10.0000 mL | Freq: Once | INTRAVENOUS | Status: AC | PRN
  Administered 2024-06-12: 10 mL via INTRAVENOUS

## 2024-06-12 MED ORDER — DEXAMETHASONE SODIUM PHOSPHATE 10 MG/ML IJ SOLN
INTRAMUSCULAR | Status: AC
Start: 2024-06-12 — End: 2024-06-12
  Filled 2024-06-12: qty 1

## 2024-06-12 MED ORDER — FENTANYL CITRATE (PF) 250 MCG/5ML IJ SOLN
INTRAMUSCULAR | Status: AC
  Filled 2024-06-12: qty 5

## 2024-06-12 MED ORDER — LIDOCAINE HCL 1 % IJ SOLN
INTRAMUSCULAR | Status: DC | PRN
  Administered 2024-06-12: 5 mL via INTRAMUSCULAR

## 2024-06-12 SURGICAL SUPPLY — 48 items
BLADE SURG 15 STRL LF DISP TIS (BLADE) ×1 IMPLANT
BNDG COMPR ESMARK 4X3 LF (GAUZE/BANDAGES/DRESSINGS) ×1 IMPLANT
BNDG ELASTIC 3INX 5YD STR LF (GAUZE/BANDAGES/DRESSINGS) ×1 IMPLANT
BNDG ELASTIC 4INX 5YD STR LF (GAUZE/BANDAGES/DRESSINGS) ×1 IMPLANT
BNDG GAUZE DERMACEA FLUFF 4 (GAUZE/BANDAGES/DRESSINGS) ×1 IMPLANT
CHLORAPREP W/TINT 26 (MISCELLANEOUS) ×1 IMPLANT
CNTNR URN SCR LID CUP LEK RST (MISCELLANEOUS) ×1 IMPLANT
COVER BACK TABLE 60X90IN (DRAPES) ×1 IMPLANT
CUFF TOURN SGL QUICK 18X4 (TOURNIQUET CUFF) ×1 IMPLANT
CUFF TRNQT CYL 24X4X16.5-23 (TOURNIQUET CUFF) ×1 IMPLANT
DRAPE 3/4 80X56 (DRAPES) ×1 IMPLANT
DRAPE EXTREMITY T 121X128X90 (DISPOSABLE) ×1 IMPLANT
DRAPE SHEET LG 3/4 BI-LAMINATE (DRAPES) ×1 IMPLANT
DRAPE U-SHAPE 47X51 STRL (DRAPES) ×1 IMPLANT
DRSG ADAPTIC 3X8 NADH LF (GAUZE/BANDAGES/DRESSINGS) IMPLANT
ELECT REM PT RETURN 15FT ADLT (MISCELLANEOUS) ×1 IMPLANT
GAUZE SPONGE 4X4 12PLY STRL (GAUZE/BANDAGES/DRESSINGS) ×1 IMPLANT
GAUZE XEROFORM 1X8 LF (GAUZE/BANDAGES/DRESSINGS) ×1 IMPLANT
GLOVE BIO SURGEON STRL SZ7.5 (GLOVE) ×1 IMPLANT
GLOVE BIOGEL PI IND STRL 7.5 (GLOVE) ×1 IMPLANT
GOWN STRL REUS W/ TWL XL LVL3 (GOWN DISPOSABLE) ×1 IMPLANT
KIT BASIN OR (CUSTOM PROCEDURE TRAY) ×1 IMPLANT
KIT TURNOVER KIT B (KITS) IMPLANT
MANIFOLD NEPTUNE II (INSTRUMENTS) ×1 IMPLANT
NDL HYPO 25X1 1.5 SAFETY (NEEDLE) ×1 IMPLANT
NDLE BIOPSY JAMSHIDI OT 8X11 (NEEDLE) IMPLANT
NEEDLE HYPO 25X1 1.5 SAFETY (NEEDLE) ×1 IMPLANT
NS IRRIG 1000ML POUR BTL (IV SOLUTION) ×1 IMPLANT
PACKING GAUZE IODOFORM 1INX5YD (GAUZE/BANDAGES/DRESSINGS) IMPLANT
PAD ABD 8X10 STRL (GAUZE/BANDAGES/DRESSINGS) IMPLANT
PADDING CAST ABS COTTON 4X4 ST (CAST SUPPLIES) ×1 IMPLANT
PENCIL SMOKE EVACUATOR (MISCELLANEOUS) IMPLANT
SET IRRIG Y TYPE TUR BLADDER L (SET/KITS/TRAYS/PACK) ×1 IMPLANT
SPONGE T-LAP 4X18 ~~LOC~~+RFID (SPONGE) ×1 IMPLANT
STAPLER SKIN PROX WIDE 3.9 (STAPLE) ×1 IMPLANT
STOCKINETTE 6 STRL (DRAPES) ×1 IMPLANT
SUCTION TUBE FRAZIER 10FR DISP (SUCTIONS) ×1 IMPLANT
SUT ETHILON 3 0 PS 1 (SUTURE) ×1 IMPLANT
SUT ETHILON 4 0 PS 2 18 (SUTURE) ×1 IMPLANT
SUT MNCRL AB 3-0 PS2 18 (SUTURE) ×1 IMPLANT
SUT MNCRL AB 4-0 PS2 18 (SUTURE) ×1 IMPLANT
SUT VIC AB 2-0 SH 27XBRD (SUTURE) ×1 IMPLANT
SYR BULB EAR ULCER 3OZ GRN STR (SYRINGE) ×1 IMPLANT
SYR CONTROL 10ML LL (SYRINGE) ×1 IMPLANT
TUBE CONNECTING 12X1/4 (SUCTIONS) ×1 IMPLANT
TUBE IRRIGATION SET MISONIX (TUBING) ×1 IMPLANT
UNDERPAD 30X36 HEAVY ABSORB (UNDERPADS AND DIAPERS) ×1 IMPLANT
YANKAUER SUCT BULB TIP NO VENT (SUCTIONS) ×1 IMPLANT

## 2024-06-12 NOTE — Progress Notes (Signed)
 Orthopedic Tech Progress Note Patient Details:  Jasmine Richardson Jul 27, 1961 [id number]  Ortho Devices Type of Ortho Device: Postop shoe/boot Ortho Device/Splint Location: RLE/Left at bedside Ortho Device/Splint Interventions: Ordered   Post Interventions Patient Tolerated: Well Instructions Provided: Adjustment of device  [name] 06/12/2024, 7:59 PM

## 2024-06-12 NOTE — Plan of Care (Signed)
 Patient went to I and D the wound today.  Doing well today.  Vitals remained stable.  Confusion from ativan  yesturday cleared up.

## 2024-06-12 NOTE — Progress Notes (Signed)
 Called for report. No answer. Transport sent for patient.

## 2024-06-12 NOTE — Transfer of Care (Signed)
 Immediate Anesthesia Transfer of Care Note  Patient: Jasmine Richardson  Procedure(s) Performed: IRRIGATION AND DEBRIDEMENT ABSCESS (Right)  Patient Location: PACU  Anesthesia Type:MAC  Level of Consciousness: awake, alert , and oriented  Airway & Oxygen Therapy: Patient Spontanous Breathing and Patient connected to face mask oxygen  Post-op Assessment: Report given to RN and Post -op Vital signs reviewed and stable  Post vital signs: Reviewed and stable  Last Vitals:  Vitals Value Taken Time  BP 112/69 06/12/24 15:46  Temp    Pulse 75 06/12/24 15:53  Resp 14 06/12/24 15:53  SpO2 93 % 06/12/24 15:53  Vitals shown include unfiled device data.  Last Pain:  Vitals:   06/12/24 1509  TempSrc:   PainSc: 8       Patients Stated Pain Goal: 0 (06/10/24 1256)  Complications: No notable events documented.

## 2024-06-12 NOTE — Progress Notes (Signed)
 PROGRESS NOTE  Jasmine Richardson FMW:[id number] DOB: [birth year]   PCP: Nicholaus Credit, PA-C  Patient is from: Home.  Uses rolling walker and cane at baseline.  DOA: 06/10/2024 LOS: 2  Chief complaints Chief Complaint  Patient presents with   Wound Infection    Pt.,s  Dr. Rozetta from [facility] contacted pt. Today to come to ED for a rt. Heel infection.       Brief Narrative / Interim history: 63 year old F with PMH of CAD/CABG in 2017 and PCI in 2021, HFpEF, VT s/p AICD, DM-2, PVD, obesity, HTN, HLD, neuropathy, left TMA amputation and right great toe amputation sent to ED from podiatry office due to right foot abscess.  Patient had a recent right heel wound after she stepped on a piece of glass . She saw podiatry on 6/16 and then 6/24.  Reportedly, small piece of glass was removed.  Continues to have worsening leukocytosis, pain and swelling despite 2 rounds of antibiotics with doxycycline  and then Bactrim .  She had I&D at podiatry office with removal of purulent material and sent to ED for further evaluation.  In ED, vital stable.  Leukocytosis to 22.5 with left shift.  CRP 11.2.  ESR 120.  Lactic acid negative.  Started on cefepime  and Zyvox .  MRI foot ordered.  Podiatry consulted.  MRI right foot limited by motion showed subacute displaced fracture to the displaces of the fifth metatarsal moderate to severe degenerative change to the tarsometatarsal joints and in the 2nd and 3rd MTP joint.    MRI right heel limited by motion raised concern for cellulitis but no abscess, plantar fasciitis with reactive calcaneal change and arthropathy along the Lisfranc joints.    Subjective: Seen and examined earlier this morning.  No major events overnight or this morning.  She is anxious and frustrated about going to MRI.  She refuses Ativan  out of concern for makes me crazy.   Objective: Vitals:   06/11/24 2137 06/12/24 0314 06/12/24 0839 06/12/24 1058  BP: (!) 152/130 (!) 146/71 (!)  153/81 (!) 116/95  Pulse: (!) 104 76 79 73  Resp: 16 16 18 18   Temp: 98.5 F (36.9 C) 98.7 F (37.1 C) 98.1 F (36.7 C) 98.1 F (36.7 C)  TempSrc: Oral Oral  Oral  SpO2: 100% 96% 98% 98%  Weight:      Height:        Examination:  GENERAL: No apparent distress.  Nontoxic. HEENT: MMM.  Vision and hearing grossly intact.  NECK: Supple.  No apparent JVD.  RESP:  No IWOB.  Fair aeration bilaterally. CVS:  RRR. Heart sounds normal.  ABD/GI/GU: BS+. Abd soft, NTND.  MSK/EXT:  S/p previous left TMA amputation.  Previous right great toe amputation.  Dressing over right foot DCI. SKIN: no apparent skin lesion or wound NEURO: AA.  Oriented appropriately.  No apparent focal neuro deficit. PSYCH: Calm. Normal affect.   Consultants:  Podiatry  Procedures: 6/24-I&D at podiatry office  Microbiology summarized: 6/24-outpatient abscess culture with group B strep's 6/25-blood cultures NGTD 6/25-MRSA PCR screen nonreactive  Assessment and plan: Right foot infection, with abscess: Reportedly stepped on a piece of glass does seem to have been removed by podiatry outpatient.  Presents with worsening swelling, pain.  Significant leukocytosis with markedly elevated inflammatory markers.  MRI foot and heel as above.  Leukocytosis improved. -S/p bedside I&D at podiatry office.  Abscess culture with group B strep. -Podiatry taking patient to OR for I&D today. -Continue Zyvox  and cefepime  pending  ID input. -Pain and glycemic control   Chronic diastolic CHF: TTE in 02/2023 with LVEF of 60 to 65%.  Appears euvolemic on exam. -Continue with home Lasix , metoprolol , Farxiga  and valsartan  -Monitor fluid status   History of CAD s/p CABG in 2017 and RCA PCI in 05/2020: No cardiopulmonary symptoms. -Continue holding Plavix  for possible need of surgical intervention -Continue with statin and aspirin . -Continue with valsartan  and metoprolol    History of ventricular tachycardia status post AICD -Continue  with amiodarone   Hypertension: Normotensive. - Continue home regimen - Hydralazine  as needed   IDDM-2 with hyperglycemia: A1c 8.9% (was 9.4% on 02/18/2024)..  On Toujeo  63 units daily, metformin , Ozempic  and Farxiga  at home. Recent Labs  Lab 06/11/24 1317 06/11/24 1727 06/11/24 2048 06/12/24 0647 06/12/24 1210  GLUCAP 144* 122* 157* 157* 136*  -Continue home Farxiga  -Continue Semglee  50 units daily - Continue SSI-moderate. - Continue NovoLog  3 units 3 times daily with meals  UTI: Reports some UTI symptoms including dysuria and frequency before coming to ED.  UA concerning.  Urine culture was not sent.  UTI symptoms resolving. - Should be covered with IV cefepime .  Hyperlipidemia: On Repatha  outpatient. - Continue Crestor   Obesity class I Body mass index is 34.3 kg/m. - Continue home Ozempic            DVT prophylaxis:  heparin  injection 5,000 Units Start: 06/10/24 2200  Code Status: Full code Family Communication: None at bedside Level of care: Med-Surg Status is: Inpatient Remains inpatient appropriate because: Right foot infection   Final disposition: To be determined   55 minutes with more than 50% spent in reviewing records, counseling patient/family and coordinating care.   Sch Meds:  Scheduled Meds:  amiodarone   200 mg Oral Daily   aspirin   81 mg Oral Daily   dapagliflozin  propanediol  10 mg Oral Daily   furosemide   20 mg Oral Daily   heparin   5,000 Units Subcutaneous Q8H   insulin  aspart  0-15 Units Subcutaneous TID WC   insulin  aspart  0-5 Units Subcutaneous QHS   insulin  aspart  3 Units Subcutaneous TID WC   insulin  glargine-yfgn  50 Units Subcutaneous Daily   irbesartan   75 mg Oral Daily   metoprolol  succinate  12.5 mg Oral Daily   rosuvastatin   20 mg Oral Daily   senna  1 tablet Oral Daily   Continuous Infusions:  ceFEPime  (MAXIPIME ) IV 2 g (06/12/24 1413)   linezolid  (ZYVOX ) IV 600 mg (06/12/24 0830)   PRN Meds:.acetaminophen ,  hydrALAZINE , nitroGLYCERIN , oxyCODONE   Antimicrobials: Anti-infectives (From admission, onward)    Start     Dose/Rate Route Frequency Ordered Stop   06/11/24 1430  ceFEPIme  (MAXIPIME ) 2 g in sodium chloride  0.9 % 100 mL IVPB        2 g 200 mL/hr over 30 Minutes Intravenous Every 12 hours 06/11/24 0912 06/18/24 1429   06/11/24 0230  ceFEPIme  (MAXIPIME ) 2 g in sodium chloride  0.9 % 100 mL IVPB  Status:  Discontinued        2 g 200 mL/hr over 30 Minutes Intravenous Every 8 hours 06/10/24 1821 06/11/24 0912   06/10/24 2000  linezolid  (ZYVOX ) IVPB 600 mg        600 mg 300 mL/hr over 60 Minutes Intravenous Every 12 hours 06/10/24 1756     06/10/24 1830  ceFEPIme  (MAXIPIME ) 2 g in sodium chloride  0.9 % 100 mL IVPB        2 g 200 mL/hr over 30 Minutes Intravenous  Once  06/10/24 1755 06/10/24 1937        I have personally reviewed the following labs and images: CBC: Recent Labs  Lab 06/09/24 1706 06/10/24 1311 06/11/24 0631 06/12/24 0656  WBC 22.6* 22.5* 17.0* 15.9*  NEUTROABS 19.0* 19.3*  --   --   HGB 11.8 11.9* 10.4* 10.6*  HCT 36.5 36.6 32.6* 33.2*  MCV 89 89.1 89.3 88.5  PLT 403 433* 378 372   BMP &GFR Recent Labs  Lab 06/10/24 1311 06/11/24 0631 06/12/24 0656  NA 137 135 133*  K 4.4 4.3 4.3  CL 103 103 103  CO2 22 19* 23  GLUCOSE 264* 178* 138*  BUN 17 20 18   CREATININE 1.03* 1.24* 1.08*  CALCIUM  9.6 8.9 9.0  MG  --   --  2.0  PHOS  --   --  3.1   Estimated Creatinine Clearance: 64.6 mL/min (A) (by C-G formula based on SCr of 1.08 mg/dL (H)). Liver & Pancreas: Recent Labs  Lab 06/10/24 1311 06/12/24 0656  AST 11*  --   ALT 11  --   ALKPHOS 55  --   BILITOT 0.5  --   PROT 8.0  --   ALBUMIN 2.7* 2.3*   No results for input(s): LIPASE, AMYLASE in the last 168 hours. No results for input(s): AMMONIA in the last 168 hours. Diabetic: Recent Labs    06/11/24 1202  HGBA1C 8.9*   Recent Labs  Lab 06/11/24 1317 06/11/24 1727 06/11/24 2048  06/12/24 0647 06/12/24 1210  GLUCAP 144* 122* 157* 157* 136*   Cardiac Enzymes: No results for input(s): CKTOTAL, CKMB, CKMBINDEX, TROPONINI in the last 168 hours. No results for input(s): PROBNP in the last 8760 hours. Coagulation Profile: No results for input(s): INR, PROTIME in the last 168 hours. Thyroid Function Tests: No results for input(s): TSH, T4TOTAL, FREET4, T3FREE, THYROIDAB in the last 72 hours. Lipid Profile: No results for input(s): CHOL, HDL, LDLCALC, TRIG, CHOLHDL, LDLDIRECT in the last 72 hours. Anemia Panel: No results for input(s): VITAMINB12, FOLATE, FERRITIN, TIBC, IRON, RETICCTPCT in the last 72 hours. Urine analysis:    Component Value Date/Time   COLORURINE AMBER (A) 06/10/2024 1830   APPEARANCEUR CLOUDY (A) 06/10/2024 1830   LABSPEC 1.029 06/10/2024 1830   PHURINE 5.0 06/10/2024 1830   GLUCOSEU >=500 (A) 06/10/2024 1830   HGBUR LARGE (A) 06/10/2024 1830   BILIRUBINUR NEGATIVE 06/10/2024 1830   BILIRUBINUR negative 10/16/2023 1516   KETONESUR NEGATIVE 06/10/2024 1830   PROTEINUR >=300 (A) 06/10/2024 1830   UROBILINOGEN negative (A) 10/16/2023 1516   NITRITE POSITIVE (A) 06/10/2024 1830   LEUKOCYTESUR NEGATIVE 06/10/2024 1830   Sepsis Labs: Invalid input(s): PROCALCITONIN, LACTICIDVEN  Microbiology: Recent Results (from the past 240 hours)  WOUND CULTURE     Status: Abnormal   Collection Time: 06/09/24  3:58 PM   Specimen: Foot, Right; Wound   Wound  Result Value Ref Range Status   Gram Stain Result Final report  Final   Organism ID, Bacteria Comment  Final    Comment: No white blood cells seen.   Organism ID, Bacteria Comment  Final    Comment: Moderate number of gram positive cocci.   Aerobic Bacterial Culture Final report (A)  Final   Organism ID, Bacteria Comment (A)  Final    Comment: Beta hemolytic Streptococcus, group B Heavy growth Penicillin and ampicillin are drugs of choice for  treatment of beta-hemolytic streptococcal infections. Susceptibility testing of penicillins and other beta-lactam agents approved by the FDA for  treatment of beta-hemolytic streptococcal infections need not be performed routinely because nonsusceptible isolates are extremely rare in any beta-hemolytic streptococcus and have not been reported for Streptococcus pyogenes (group A). (CLSI)   Blood culture (routine x 2)     Status: None (Preliminary result)   Collection Time: 06/10/24  1:11 PM   Specimen: BLOOD  Result Value Ref Range Status   Specimen Description BLOOD RIGHT ANTECUBITAL  Final   Special Requests   Final    BOTTLES DRAWN AEROBIC AND ANAEROBIC Blood Culture results may not be optimal due to an inadequate volume of blood received in culture bottles   Culture   Final    NO GROWTH 2 DAYS Performed at [facility], [address] [place], [place], [place] [address]    Report Status PENDING  Incomplete  Blood culture (routine x 2)     Status: None (Preliminary result)   Collection Time: 06/10/24  6:41 PM   Specimen: BLOOD RIGHT ARM  Result Value Ref Range Status   Specimen Description BLOOD RIGHT ARM  Final   Special Requests   Final    BOTTLES DRAWN AEROBIC AND ANAEROBIC Blood Culture results may not be optimal due to an inadequate volume of blood received in culture bottles   Culture   Final    NO GROWTH 2 DAYS Performed at [facility], [address] [place], [place] [address]    Report Status PENDING  Incomplete  Surgical PCR screen     Status: None   Collection Time: 06/10/24 11:59 PM   Specimen: Nasal Mucosa; Nasal Swab  Result Value Ref Range Status   MRSA, PCR NEGATIVE NEGATIVE Final   Staphylococcus aureus NEGATIVE NEGATIVE Final    Comment: (NOTE) The Xpert SA Assay (FDA approved for NASAL specimens in patients 59 years of age and older), is one component of a comprehensive surveillance program. It is not intended to diagnose infection nor to guide or  monitor treatment. Performed at [facility], [address], [place], [place] [address]     Radiology Studies: MR HEEL RIGHT W WO CONTRAST Result Date: 06/12/2024 CLINICAL DATA:  Right heel abscess EXAM: MR OF THE RIGHT HEEL WITHOUT AND WITH CONTRAST TECHNIQUE: Multiplanar, multisequence MR imaging of the right ankle was performed both before and after administration of intravenous contrast. CONTRAST:  10mL GADAVIST  GADOBUTROL  1 MMOL/ML IV SOLN COMPARISON:  None Available. FINDINGS: Despite efforts by the technologist and patient, motion artifact is present on today's exam and could not be eliminated. This reduces exam sensitivity and specificity. Bones/Joint/Cartilage Fifth metatarsal fracture discussed on yesterday's foot MRI and better characterized on that exam. Arthropathy along the Lisfranc joint with subcortical foci of marrow edema especially along the cuneiforms, cuboid, and second through fifth metatarsals. Varus angulation at the Chopart joint. Trace endosteal edema along the inferior calcaneus, likely reactive given the adjacent suspected plantar fasciitis and edema in the plantar calcaneal spur. Ligaments Lateral ligamentous complex and deltoid ligament grossly intact although suboptimally assessed due to motion artifact and difficulties obtaining standard positioning. Muscles and Tendons Distal Achilles tendinopathy and partial tearing. Thickened plantar fascia suggesting plantar fasciitis. Low-level edema in the abductor hallucis muscle, potentially from mild myositis or denervation edema. Soft tissues Abnormal plantar edema along the heel, cellulitis not excluded. No drainable abscess. IMPRESSION: 1. Reduced diagnostic sensitivity and specificity due to patient motion. 2. Abnormal plantar edema along the heel, cellulitis not excluded. No drainable abscess. 3. Thickened plantar fascia suggesting plantar fasciitis. Trace  endosteal edema along the inferior calcaneus, likely reactive given  the adjacent suspected plantar fasciitis and edema in the plantar calcaneal spur. 4. Distal Achilles tendinopathy and partial tearing. 5. Arthropathy along the Lisfranc joint with subcortical foci of marrow edema especially along the cuneiforms, cuboid, and second through fifth metatarsals. Varus angulation at the Chopart joint. 6. Fifth metatarsal fracture discussed on yesterday's foot MRI and better characterized on that exam. 7. Low-level edema in the abductor hallucis muscle, potentially from mild myositis or denervation edema. Electronically Signed   By: Ryan Salvage M.D.   On: 06/12/2024 12:32      Lillyann Ahart T. Jveon Pound [facility]  If 7PM-7AM, please contact night-coverage [email or web address] 06/12/2024, 2:30 PM

## 2024-06-12 NOTE — Anesthesia Preprocedure Evaluation (Addendum)
 Anesthesia Evaluation  Patient identified by MRN, date of birth, ID band Patient awake    Reviewed: Allergy & Precautions, NPO status , Patient's Chart, lab work & pertinent test results  Airway Mallampati: II  TM Distance: >3 FB   Mouth opening: Pediatric Airway  Dental  (+) Edentulous Upper, Edentulous Lower   Pulmonary COPD, former smoker   Pulmonary exam normal        Cardiovascular hypertension, Pt. on medications and Pt. on home beta blockers + CAD, + Past MI, + CABG and + Peripheral Vascular Disease  + Cardiac Defibrillator  Rhythm:Regular Rate:Normal  ECHO:  1. Left ventricular ejection fraction, by estimation, is 60 to 65%. The  left ventricle has normal function. The left ventricle has no regional  wall motion abnormalities. There is mild left ventricular hypertrophy.  Left ventricular diastolic function  could not be evaluated.   2. Right ventricular systolic function is normal. The right ventricular  size is normal.   3. Left atrial size was mildly dilated.   4. The mitral valve is normal in structure. No evidence of mitral valve  regurgitation. No evidence of mitral stenosis.   5. The aortic valve is calcified. There is mild calcification of the  aortic valve. There is mild thickening of the aortic valve. Aortic valve  regurgitation is not visualized. No aortic stenosis is present.   6. The inferior vena cava is normal in size with greater than 50%  respiratory variability, suggesting right atrial pressure of 3 mmHg.     Neuro/Psych   Anxiety     negative neurological ROS     GI/Hepatic Neg liver ROS, hiatal hernia,GERD  ,,  Endo/Other  diabetes, Poorly Controlled, Type 2, Insulin  Dependent, Oral Hypoglycemic Agents    Renal/GU   negative genitourinary   Musculoskeletal Heel abscess   Abdominal Normal abdominal exam  (+)   Peds  Hematology  (+) Blood dyscrasia, anemia Lab Results      Component                 Value               Date                      WBC                      15.9 (H)            06/12/2024                HGB                      10.6 (L)            06/12/2024                HCT                      33.2 (L)            06/12/2024                MCV                      88.5                06/12/2024                PLT  372                 06/12/2024              Anesthesia Other Findings   Reproductive/Obstetrics                             Anesthesia Physical Anesthesia Plan  ASA: 3  Anesthesia Plan: MAC   Post-op Pain Management:    Induction: Intravenous  PONV Risk Score and Plan: 2 and Ondansetron , Dexamethasone , Midazolam  and Treatment may vary due to age or medical condition  Airway Management Planned: Simple Face Mask and Nasal Cannula  Additional Equipment: None  Intra-op Plan:   Post-operative Plan:   Informed Consent: I have reviewed the patients History and Physical, chart, labs and discussed the procedure including the risks, benefits and alternatives for the proposed anesthesia with the patient or authorized representative who has indicated his/her understanding and acceptance.     Dental advisory given  Plan Discussed with: CRNA  Anesthesia Plan Comments:        Anesthesia Quick Evaluation

## 2024-06-12 NOTE — Anesthesia Postprocedure Evaluation (Signed)
 Anesthesia Post Note  Patient: Jasmine Richardson  Procedure(s) Performed: IRRIGATION AND DEBRIDEMENT ABSCESS (Right: Foot)     Patient location during evaluation: PACU Anesthesia Type: MAC Level of consciousness: awake and alert Pain management: pain level controlled Vital Signs Assessment: post-procedure vital signs reviewed and stable Respiratory status: spontaneous breathing, nonlabored ventilation, respiratory function stable and patient connected to nasal cannula oxygen Cardiovascular status: stable and blood pressure returned to baseline Postop Assessment: no apparent nausea or vomiting Anesthetic complications: no   No notable events documented.  Last Vitals:  Vitals:   06/12/24 1549 06/12/24 1600  BP:  135/65  Pulse: 73 75  Resp: 13 17  Temp:  36.8 C  SpO2: 95% 95%    Last Pain:  Vitals:   06/12/24 1600  TempSrc:   PainSc: 0-No pain                 Cordella P Phyliss Hulick

## 2024-06-12 NOTE — Progress Notes (Signed)
 PODIATRY PROGRESS NOTE Patient Name: Jasmine Richardson  DOB 09-Mar-1961 DOA 06/10/2024  Hospital Day: 3  Assessment:  63 y.o. female with PMHx significant for  DM2 with neropathy and prior partial first ray amputation  with right heel cellulitis and possible residual abscess/FB s/p bedside I&D and foreign body removal in office by Dr. Lamount.    WBC 15.9 ESR and CRP 120, 11.2 MRI R heel W WO contrast:  1. Reduced diagnostic sensitivity and specificity due to patient motion. 2. Abnormal plantar edema along the heel, cellulitis not excluded. No drainable abscess. 3. Thickened plantar fascia suggesting plantar fasciitis. Trace endosteal edema along the inferior calcaneus, likely reactive given the adjacent suspected plantar fasciitis and edema in the plantar calcaneal spur.  Wound culture from abscess drainage from office: Beta hemolytic Streptococcus, group B  Heavy growth     Plan:  - I cleansed the right heel and superficially debrided some skin bedside, very small amount of purulence. Despite MRI findings being negative for drainable abscess I feel prudent to proceed with I&D of right heel to eval for any deep infection given clinical picture and labs. NPO for OR today, she agrees to proceed - Continue IV abx broad spectrum pending further culture data, currently office cx with Group B strep - Anticoagulation: Ok to continue at this time - Wound care: leave surgical dressing  C/D/I - WB status: WBAT to RLE - Will continue to follow        Jasmine Richardson, DPM [facility]    Subjective:  Discussed with patient MRI findings and plan for surgery and she agrees to proceed.   Objective:   Vitals:   06/12/24 0839 06/12/24 1058  BP: (!) 153/81 (!) 116/95  Pulse: 79 73  Resp: 18 18  Temp: 98.1 F (36.7 C) 98.1 F (36.7 C)  SpO2: 98% 98%       Latest Ref Rng & Units 06/12/2024    6:56 AM 06/11/2024    6:31 AM 06/10/2024    1:11 PM  CBC  WBC 4.0 - 10.5 K/uL  15.9  17.0  22.5   Hemoglobin 12.0 - 15.0 g/dL 89.3  89.5  88.0   Hematocrit 36.0 - 46.0 % 33.2  32.6  36.6   Platelets 150 - 400 K/uL 372  378  433        Latest Ref Rng & Units 06/12/2024    6:56 AM 06/11/2024    6:31 AM 06/10/2024    1:11 PM  BMP  Glucose 70 - 99 mg/dL 861  821  735   BUN 8 - 23 mg/dL 18  20  17    Creatinine 0.44 - 1.00 mg/dL 8.91  8.75  8.96   Sodium 135 - 145 mmol/L 133  135  137   Potassium 3.5 - 5.1 mmol/L 4.3  4.3  4.4   Chloride 98 - 111 mmol/L 103  103  103   CO2 22 - 32 mmol/L 23  19  22    Calcium  8.9 - 10.3 mg/dL 9.0  8.9  9.6     General: AAOx3, NAD  Lower Extremity Exam Right foot with erythema and edema of the heel  Areas of what appear to be superficial abscess Pain on palpation of the heel, skin feels very taught due to edema or underlying fluid   DP and PT pulses palpable Sensation diminished to toes Prior right partial first ray amputation well healed      Radiology:  Results  reviewed. See assessment for pertinent imaging results

## 2024-06-12 NOTE — Op Note (Signed)
 Full Operative Report  Date of Operation: 3:20 PM, 06/12/2024   Patient: Jasmine Richardson - 63 y.o. female  Surgeon: Jasmine Richardson, DPM   Assistant: None  Diagnosis: Abscess Right heel  Procedure:  1. Incision and drainage below fascia level, right heel 2. Bone biopsy heel deep with trocar, right heel    Anesthesia: Monitor Anesthesia Care  Jasmine Richardson, Jasmine SQUIBB, DO  Anesthesiologist: Jasmine Jasmine SQUIBB, DO CRNA: Jasmine Krystal LABOR, CRNA   Estimated Blood Loss: Minimal   Hemostasis: 1) Anatomical dissection, mechanical compression, electrocautery 2) No tourniquet was used  Implants: * No implants in log *  Materials: 1 in iodoform packing  Injectables: 1) Pre-operatively: 10 cc of 50:50 mixture 1%lidocaine  plain and 0.5% marcaine  plain 2) Post-operatively: None   Specimens: - Pathology: Right heel bone biopsy for pathology - Microbiology: Deep tissue culture from abscess site right heel, abscess swab culture right heel   Antibiotics: IV antibiotics given per schedule on the floor  Drains: None  Complications: Patient tolerated the procedure well without complication.   Operative findings: As below in detailed report  Indications for Procedure: Jasmine Richardson presents to Jasmine Richardson, DPM with a chief complaint of pain erythema and edema in the right heel s/p bedside I&D in office with foreign body removal. Concern for abscess and can't rule out calcaneal osteomyelitis. The patient has failed conservative treatments of various modalities. At this time the patient has elected to proceed with surgical correction. All alternatives, risks, and complications of the procedures were thoroughly explained to the patient. Patient exhibits appropriate understanding of all discussion points and informed consent was signed and obtained in the chart with no guarantees to surgical outcome given or implied.  Description of Procedure: Patient was brought to the  operating room. Patient remained on their hospital bed in the supine position. A surgical timeout was performed and all members of the operating room, the procedure, and the surgical site were identified. anesthesia occurred as per anesthesia record. Local anesthetic as previously described was then injected about the operative field in a local infiltrative block.  The operative lower extremity as noted above was then prepped and draped in the usual sterile manner. The following procedure then began.  Attention was directed to the RIGHT lower extremity. Using a #15 blade, a linear incision was made in 2 separate areas one medial heel and one plantar heel about 3 cm in length. Sharp and blunt dissection was carried deep to subcutaneous tissues, being careful to protect all neurovascular structures. At this time, approximately 5 mL of purulent fluid was evacuated from the incision both medial and plantar, which is indicative of poorly formed abscess in the deep subcutaneous fat tissue.  A deep tissue culture was obtained both with swabs as well as rongeur from the right heel. Manual pressure was  applied to evacuate any remaining fluids. Using a hemostat, the subcutaneous tissue layers were explored for any sinus tracts, gross debris, and to free up any remaining fluid matter. The area of concern did probe to bone, capsule, tendon, or other deep structures with what appeared to be necrotic fatty tissue. 3000ml of saline was used under power pulse lavage to irrigate the surgical site.   Next given the elevated inflammatory markers and concern for possible early marrow edema in the plantar right heel decision was made to do a calcaneal bone biopsy. The Jamshidi needle was advanced into the inferior aspect of the heel and a single core of bone was harvested. Felt  to be of normal firmness. The core was sent for pathology. Next the two incisions were left open with sterile packing material and dry sterile dressing was  applied.   The surgical site was then dressed with betadine  soaked adaptic 4x4 abd pad kerlix and ace wrap. The patient tolerated both the procedure and anesthesia well with vital signs stable throughout. The patient was transferred in good condition and all vital signs stable  from the OR to recovery under the discretion of anesthesia.  Condition: Vital signs stable, neurovascular status unchanged from preoperative   Surgical plan:  Intra-op findings confirmatory or abscess in the medial plantar aspect of  the right heel. Felt to be fully drained and clean at this time. Will need extended duration abx therapy, follow cultures and pathology. Dressing will need to be changed with new sterile packing starting Sunday. WBAT in post op shoe.  The patient will be WBAT in a post op shoe to the operative limb until further instructed. The dressing is to remain clean, dry, and intact. Will continue to follow unless noted elsewhere.   Marsa Honour, DPM [facility]

## 2024-06-13 ENCOUNTER — Encounter (HOSPITAL_COMMUNITY): Payer: Self-pay | Admitting: Podiatry

## 2024-06-13 DIAGNOSIS — Z95810 Presence of automatic (implantable) cardiac defibrillator: Secondary | ICD-10-CM | POA: Diagnosis not present

## 2024-06-13 DIAGNOSIS — L02611 Cutaneous abscess of right foot: Secondary | ICD-10-CM | POA: Diagnosis not present

## 2024-06-13 DIAGNOSIS — Z951 Presence of aortocoronary bypass graft: Secondary | ICD-10-CM | POA: Diagnosis not present

## 2024-06-13 DIAGNOSIS — L089 Local infection of the skin and subcutaneous tissue, unspecified: Secondary | ICD-10-CM | POA: Diagnosis not present

## 2024-06-13 LAB — CBC
HCT: 31.9 % — ABNORMAL LOW (ref 36.0–46.0)
Hemoglobin: 10.3 g/dL — ABNORMAL LOW (ref 12.0–15.0)
MCH: 29 pg (ref 26.0–34.0)
MCHC: 32.3 g/dL (ref 30.0–36.0)
MCV: 89.9 fL (ref 80.0–100.0)
Platelets: 353 10*3/uL (ref 150–400)
RBC: 3.55 MIL/uL — ABNORMAL LOW (ref 3.87–5.11)
RDW: 13.2 % (ref 11.5–15.5)
WBC: 20.1 10*3/uL — ABNORMAL HIGH (ref 4.0–10.5)
nRBC: 0 % (ref 0.0–0.2)

## 2024-06-13 LAB — GLUCOSE, CAPILLARY
Glucose-Capillary: 145 mg/dL — ABNORMAL HIGH (ref 70–99)
Glucose-Capillary: 158 mg/dL — ABNORMAL HIGH (ref 70–99)
Glucose-Capillary: 197 mg/dL — ABNORMAL HIGH (ref 70–99)
Glucose-Capillary: 212 mg/dL — ABNORMAL HIGH (ref 70–99)
Glucose-Capillary: 210 mg/dL — ABNORMAL HIGH (ref 70–99)

## 2024-06-13 LAB — RENAL FUNCTION PANEL
Albumin: 2.2 g/dL — ABNORMAL LOW (ref 3.5–5.0)
Anion gap: 11 (ref 5–15)
BUN: 18 mg/dL (ref 8–23)
CO2: 22 mmol/L (ref 22–32)
Calcium: 9.2 mg/dL (ref 8.9–10.3)
Chloride: 104 mmol/L (ref 98–111)
Creatinine, Ser: 1.14 mg/dL — ABNORMAL HIGH (ref 0.44–1.00)
GFR, Estimated: 54 mL/min — ABNORMAL LOW (ref >60)
Glucose, Bld: 92 mg/dL (ref 70–99)
Phosphorus: 2.8 mg/dL (ref 2.5–4.6)
Potassium: 4.9 mmol/L (ref 3.5–5.1)
Sodium: 137 mmol/L (ref 135–145)

## 2024-06-13 LAB — MAGNESIUM: Magnesium: 2.1 mg/dL (ref 1.7–2.4)

## 2024-06-13 MED ORDER — CLOPIDOGREL BISULFATE 75 MG PO TABS
75.0000 mg | ORAL_TABLET | Freq: Every day | ORAL | Status: DC
  Administered 2024-06-13 – 2024-06-14 (×2): 75 mg via ORAL
  Filled 2024-06-13 (×2): qty 1

## 2024-06-13 MED ORDER — CALCIUM CARBONATE ANTACID 500 MG PO CHEW
1.0000 | CHEWABLE_TABLET | Freq: Four times a day (QID) | ORAL | Status: DC | PRN
  Administered 2024-06-13: 200 mg via ORAL
  Filled 2024-06-13: qty 1
  Filled 2024-06-13: qty 2

## 2024-06-13 NOTE — Evaluation (Signed)
 Physical Therapy Evaluation Patient Details Name: Jasmine Richardson MRN: [id number] DOB: 1961-01-09 Today's Date: 06/13/2024  History of Present Illness  Pt is a 63 y/o F presenting to ED on 6/25 with R heel wound from stepping on glass, s/p I & D on 6/27. PMH includes DM2 with neuropathy, CAD s/p CABG, PCI, HTN, HLD, ventricular tachycardia s/p AICD, obesity  Clinical Impression  Patient presents with mild dependencies in gait and mobility, due to recent surgery and pain.  Patient currently requiring contact guard assistance with mobility.  Pt has supportive friends and family who can provide assistance.  Anticipate steady progress and recommend HHPT at discharge.  PT will follow 1x/week while in hospital.          If plan is discharge home, recommend the following: A little help with walking and/or transfers;A little help with bathing/dressing/bathroom;Assistance with cooking/housework;Help with stairs or ramp for entrance   Can travel by private vehicle        Equipment Recommendations None recommended by PT  Recommendations for Other Services       Functional Status Assessment Patient has had a recent decline in their functional status and demonstrates the ability to make significant improvements in function in a reasonable and predictable amount of time.     Precautions / Restrictions Precautions Precautions: Fall Restrictions Weight Bearing Restrictions Per Provider Order: Yes RLE Weight Bearing Per Provider Order: Weight bearing as tolerated Other Position/Activity Restrictions: in postop shoe      Mobility  Bed Mobility Overal bed mobility: Modified Independent Bed Mobility: Supine to Sit, Sit to Supine     Supine to sit: Modified independent (Device/Increase time) Sit to supine: Modified independent (Device/Increase time)        Transfers Overall transfer level: Needs assistance Equipment used: Rolling walker (2 wheels) Transfers: Sit to/from Stand Sit to  Stand: Contact guard assist                Ambulation/Gait Ambulation/Gait assistance: Supervision Gait Distance (Feet): 60 Feet Assistive device: Rolling walker (2 wheels) Gait Pattern/deviations: Step-to pattern, Decreased stride length Gait velocity: decreased     General Gait Details: post-op shoe during gait  Stairs            Wheelchair Mobility     Tilt Bed    Modified Rankin (Stroke Patients Only)       Balance Overall balance assessment: Needs assistance Sitting-balance support: Feet supported, No upper extremity supported Sitting balance-Leahy Scale: Good     Standing balance support: Bilateral upper extremity supported, During functional activity, Reliant on assistive device for balance Standing balance-Leahy Scale: Poor                               Pertinent Vitals/Pain Pain Assessment Pain Score: 3  Pain Location: R foot Pain Descriptors / Indicators: Discomfort Pain Intervention(s): Monitored during session    Home Living Family/patient expects to be discharged to:: Private residence Living Arrangements: Spouse/significant other Available Help at Discharge: Family;Available PRN/intermittently Type of Home: House Home Access: Level entry       Home Layout: One level Home Equipment: Shower seat;Cane - single Librarian, academic (2 wheels)      Prior Function Prior Level of Function : Independent/Modified Independent;Driving             Mobility Comments: cane/RW PRN ADLs Comments: ind     Extremity/Trunk Assessment   Upper Extremity Assessment Upper Extremity Assessment: Defer to  OT evaluation    Lower Extremity Assessment Lower Extremity Assessment: Overall WFL for tasks assessed       Communication        Cognition Arousal: Alert Behavior During Therapy: WFL for tasks assessed/performed   PT - Cognitive impairments: No apparent impairments                                  Cueing       General Comments      Exercises     Assessment/Plan    PT Assessment Patient needs continued PT services  PT Problem List Decreased activity tolerance;Decreased balance;Decreased mobility       PT Treatment Interventions Gait training;Functional mobility training;Therapeutic activities;Therapeutic exercise;Balance training    PT Goals (Current goals can be found in the Care Plan section)  Acute Rehab PT Goals Patient Stated Goal: go home today PT Goal Formulation: With patient Time For Goal Achievement: 06/20/24 Potential to Achieve Goals: Good    Frequency Min 1X/week     Co-evaluation               AM-PAC PT 6 Clicks Mobility  Outcome Measure Help needed turning from your back to your side while in a flat bed without using bedrails?: None Help needed moving from lying on your back to sitting on the side of a flat bed without using bedrails?: None Help needed moving to and from a bed to a chair (including a wheelchair)?: A Little Help needed standing up from a chair using your arms (e.g., wheelchair or bedside chair)?: A Little Help needed to walk in hospital room?: A Little Help needed climbing 3-5 steps with a railing? : A Little 6 Click Score: 20    End of Session   Activity Tolerance: Patient tolerated treatment well Patient left: in bed;with bed alarm set;with call bell/phone within reach   PT Visit Diagnosis: Unsteadiness on feet (R26.81)    Time: 8664-8644 PT Time Calculation (min) (ACUTE ONLY): 20 min   Charges:   PT Evaluation $PT Eval Moderate Complexity: 1 Mod   PT General Charges $$ ACUTE PT VISIT: 1 Visit         06/13/2024 Lebron, PT Acute Rehabilitation Services Office:  [phone]   Katharina Venus HERO 06/13/2024, 2:00 PM

## 2024-06-13 NOTE — Progress Notes (Signed)
 PROGRESS NOTE  Jasmine Richardson FMW:[id number] DOB: [birth year]   PCP: Nicholaus Credit, PA-C  Patient is from: Home.  Uses rolling walker and cane at baseline.  DOA: 06/10/2024 LOS: 3  Chief complaints Chief Complaint  Patient presents with   Wound Infection    Pt.,s  Dr. Rozetta from [facility] contacted pt. Today to come to ED for a rt. Heel infection.       Brief Narrative / Interim history: 63 year old F with PMH of CAD/CABG in 2017 and PCI in 2021, HFpEF, VT s/p AICD, DM-2, PVD, obesity, HTN, HLD, neuropathy, left TMA amputation and right great toe amputation sent to ED from podiatry office due to right foot abscess.  Patient had a recent right heel wound after she stepped on a piece of glass . She saw podiatry on 6/16 and then 6/24.  Reportedly, small piece of glass was removed.  Continues to have worsening leukocytosis, pain and swelling despite 2 rounds of antibiotics with doxycycline  and then Bactrim .  She had I&D at podiatry office with removal of purulent material and sent to ED for further evaluation.  In ED, vital stable.  Leukocytosis to 22.5 with left shift.  CRP 11.2.  ESR 120.  Lactic acid negative.  Started on cefepime  and Zyvox .  MRI foot ordered.  Podiatry consulted.  MRI right foot limited by motion showed subacute displaced fracture to the displaces of the fifth metatarsal moderate to severe degenerative change to the tarsometatarsal joints and in the 2nd and 3rd MTP joint.    MRI right heel limited by motion raised concern for cellulitis but no abscess, plantar fasciitis with reactive calcaneal change and arthropathy along the Lisfranc joints.  Underwent I&D and bone biopsy by Dr. Pierce on 6/27.  Surgical culture with GPC in pairs.  Antibiotic de-escalated to IV Unasyn after discussion with ID.   Subjective: Seen and examined earlier this morning.  No major events overnight or this morning.  Feels sore in her right heel.  No other  complaints.  Objective: Vitals:   06/12/24 2001 06/13/24 0415 06/13/24 0819 06/13/24 1340  BP: (!) 140/79 127/72 128/78 (!) 141/78  Pulse: 69 86 83 100  Resp: 18 18 18 18   Temp: 99.6 F (37.6 C) 99.3 F (37.4 C) 99 F (37.2 C) 98.5 F (36.9 C)  TempSrc: Oral  Oral Oral  SpO2: 95% 95% 97% 98%  Weight:      Height:        Examination:  GENERAL: No apparent distress.  Nontoxic. HEENT: MMM.  Vision and hearing grossly intact.  NECK: Supple.  No apparent JVD.  RESP:  No IWOB.  Fair aeration bilaterally. CVS:  RRR. Heart sounds normal.  ABD/GI/GU: BS+. Abd soft, NTND.  MSK/EXT:  S/p previous left TMA amputation.  Previous right great toe amputation.  Right foot in Ace wrap and postop shoe. SKIN: no apparent skin lesion or wound NEURO: AA.  Oriented appropriately.  No apparent focal neuro deficit. PSYCH: Calm. Normal affect.   Consultants:  Podiatry  Procedures: 6/24-I&D at podiatry office 6/27-I&D and bone biopsy by Dr. Lavone  Microbiology summarized: 6/24-outpatient abscess culture with group B strep's 6/25-blood cultures NGTD 6/25-MRSA PCR screen nonreactive  Assessment and plan: Right foot infection, with abscess: Reportedly stepped on a piece of glass does seem to have been removed by podiatry outpatient.  Presents with worsening swelling, pain.  Significant leukocytosis with markedly elevated inflammatory markers.  MRI foot and heel as above.  Leukocytosis improved. -S/p bedside I&D  at podiatry office on 6/24.  Abscess culture with group B strep. -S/p I&D and heel bone biopsy by Dr. Pierce on 6/27.  Surgical culture with GPC in pairs. -Podiatry taking patient to OR for I&D today. -Zosyn  and cefepime  6/25>> IV Unasyn 6/27>> -Per podiatry, WBAT in postop shoe -Pain and glycemic control -PT/OT eval   Chronic diastolic CHF: TTE in 02/2023 with LVEF of 60 to 65%.  Appears euvolemic on exam. -Continue with home Lasix , metoprolol , Farxiga  and valsartan  -Monitor  fluid status   History of CAD s/p CABG in 2017 and RCA PCI in 05/2020: No cardiopulmonary symptoms. -Resume home Plavix . -Continue with statin and aspirin . -Continue with valsartan  and metoprolol    History of ventricular tachycardia status post AICD -Continue with amiodarone   Hypertension: Normotensive. - Continue home regimen - Hydralazine  as needed   IDDM-2 with hyperglycemia: A1c 8.9% (was 9.4% on 02/18/2024)..  On Toujeo  63 units daily, metformin , Ozempic  and Farxiga  at home. Recent Labs  Lab 06/12/24 1549 06/12/24 1623 06/12/24 2013 06/13/24 0602 06/13/24 1123  GLUCAP 131* 120* 139* 145* 158*  -Continue home Farxiga  -Continue Semglee  50 units daily -Continue SSI-moderate. -Continue NovoLog  3 units 3 times daily with meals  UTI: Reports some UTI symptoms including dysuria and frequency before coming to ED.  UA concerning.  Urine culture was not sent.  UTI symptoms resolving. - Should be covered on antibiotic above.  Hyperlipidemia: On Repatha  outpatient. - Continue Crestor   Obesity class I Body mass index is 34.3 kg/m. - Continue home Ozempic            DVT prophylaxis:  heparin  injection 5,000 Units Start: 06/10/24 2200  Code Status: Full code Family Communication: None at bedside Level of care: Med-Surg Status is: Inpatient Remains inpatient appropriate because: Right foot infection   Final disposition: To be determined after therapy evaluation   55 minutes with more than 50% spent in reviewing records, counseling patient/family and coordinating care.   Sch Meds:  Scheduled Meds:  amiodarone   200 mg Oral Daily   aspirin   81 mg Oral Daily   dapagliflozin  propanediol  10 mg Oral Daily   furosemide   20 mg Oral Daily   heparin   5,000 Units Subcutaneous Q8H   insulin  aspart  0-15 Units Subcutaneous TID WC   insulin  aspart  0-5 Units Subcutaneous QHS   insulin  aspart  3 Units Subcutaneous TID WC   insulin  glargine-yfgn  50 Units Subcutaneous Daily    irbesartan   75 mg Oral Daily   metoprolol  succinate  12.5 mg Oral Daily   rosuvastatin   20 mg Oral Daily   senna  1 tablet Oral Daily   Continuous Infusions:  ampicillin-sulbactam (UNASYN) IV 3 g (06/13/24 1405)   PRN Meds:.acetaminophen , hydrALAZINE , nitroGLYCERIN , oxyCODONE   Antimicrobials: Anti-infectives (From admission, onward)    Start     Dose/Rate Route Frequency Ordered Stop   06/12/24 2359  Ampicillin-Sulbactam (UNASYN) 3 g in sodium chloride  0.9 % 100 mL IVPB        3 g 200 mL/hr over 30 Minutes Intravenous Every 6 hours 06/12/24 1555     06/11/24 1430  ceFEPIme  (MAXIPIME ) 2 g in sodium chloride  0.9 % 100 mL IVPB  Status:  Discontinued        2 g 200 mL/hr over 30 Minutes Intravenous Every 12 hours 06/11/24 0912 06/12/24 1554   06/11/24 0230  ceFEPIme  (MAXIPIME ) 2 g in sodium chloride  0.9 % 100 mL IVPB  Status:  Discontinued        2  g 200 mL/hr over 30 Minutes Intravenous Every 8 hours 06/10/24 1821 06/11/24 0912   06/10/24 2000  linezolid  (ZYVOX ) IVPB 600 mg  Status:  Discontinued        600 mg 300 mL/hr over 60 Minutes Intravenous Every 12 hours 06/10/24 1756 06/12/24 1554   06/10/24 1830  ceFEPIme  (MAXIPIME ) 2 g in sodium chloride  0.9 % 100 mL IVPB        2 g 200 mL/hr over 30 Minutes Intravenous  Once 06/10/24 1755 06/10/24 1937        I have personally reviewed the following labs and images: CBC: Recent Labs  Lab 06/09/24 1706 06/10/24 1311 06/11/24 0631 06/12/24 0656  WBC 22.6* 22.5* 17.0* 15.9*  NEUTROABS 19.0* 19.3*  --   --   HGB 11.8 11.9* 10.4* 10.6*  HCT 36.5 36.6 32.6* 33.2*  MCV 89 89.1 89.3 88.5  PLT 403 433* 378 372   BMP &GFR Recent Labs  Lab 06/10/24 1311 06/11/24 0631 06/12/24 0656  NA 137 135 133*  K 4.4 4.3 4.3  CL 103 103 103  CO2 22 19* 23  GLUCOSE 264* 178* 138*  BUN 17 20 18   CREATININE 1.03* 1.24* 1.08*  CALCIUM  9.6 8.9 9.0  MG  --   --  2.0  PHOS  --   --  3.1   Estimated Creatinine Clearance: 64.6 mL/min (A) (by  C-G formula based on SCr of 1.08 mg/dL (H)). Liver & Pancreas: Recent Labs  Lab 06/10/24 1311 06/12/24 0656  AST 11*  --   ALT 11  --   ALKPHOS 55  --   BILITOT 0.5  --   PROT 8.0  --   ALBUMIN 2.7* 2.3*   No results for input(s): LIPASE, AMYLASE in the last 168 hours. No results for input(s): AMMONIA in the last 168 hours. Diabetic: Recent Labs    06/11/24 1202  HGBA1C 8.9*   Recent Labs  Lab 06/12/24 1549 06/12/24 1623 06/12/24 2013 06/13/24 0602 06/13/24 1123  GLUCAP 131* 120* 139* 145* 158*   Cardiac Enzymes: No results for input(s): CKTOTAL, CKMB, CKMBINDEX, TROPONINI in the last 168 hours. No results for input(s): PROBNP in the last 8760 hours. Coagulation Profile: No results for input(s): INR, PROTIME in the last 168 hours. Thyroid Function Tests: No results for input(s): TSH, T4TOTAL, FREET4, T3FREE, THYROIDAB in the last 72 hours. Lipid Profile: No results for input(s): CHOL, HDL, LDLCALC, TRIG, CHOLHDL, LDLDIRECT in the last 72 hours. Anemia Panel: No results for input(s): VITAMINB12, FOLATE, FERRITIN, TIBC, IRON, RETICCTPCT in the last 72 hours. Urine analysis:    Component Value Date/Time   COLORURINE AMBER (A) 06/10/2024 1830   APPEARANCEUR CLOUDY (A) 06/10/2024 1830   LABSPEC 1.029 06/10/2024 1830   PHURINE 5.0 06/10/2024 1830   GLUCOSEU >=500 (A) 06/10/2024 1830   HGBUR LARGE (A) 06/10/2024 1830   BILIRUBINUR NEGATIVE 06/10/2024 1830   BILIRUBINUR negative 10/16/2023 1516   KETONESUR NEGATIVE 06/10/2024 1830   PROTEINUR >=300 (A) 06/10/2024 1830   UROBILINOGEN negative (A) 10/16/2023 1516   NITRITE POSITIVE (A) 06/10/2024 1830   LEUKOCYTESUR NEGATIVE 06/10/2024 1830   Sepsis Labs: Invalid input(s): PROCALCITONIN, LACTICIDVEN  Microbiology: Recent Results (from the past 240 hours)  WOUND CULTURE     Status: Abnormal   Collection Time: 06/09/24  3:58 PM   Specimen: Foot, Right;  Wound   Wound  Result Value Ref Range Status   Gram Stain Result Final report  Final   Organism ID, Bacteria Comment  Final    Comment: No white blood cells seen.   Organism ID, Bacteria Comment  Final    Comment: Moderate number of gram positive cocci.   Aerobic Bacterial Culture Final report (A)  Final   Organism ID, Bacteria Comment (A)  Final    Comment: Beta hemolytic Streptococcus, group B Heavy growth Penicillin and ampicillin are drugs of choice for treatment of beta-hemolytic streptococcal infections. Susceptibility testing of penicillins and other beta-lactam agents approved by the FDA for treatment of beta-hemolytic streptococcal infections need not be performed routinely because nonsusceptible isolates are extremely rare in any beta-hemolytic streptococcus and have not been reported for Streptococcus pyogenes (group A). (CLSI)   Blood culture (routine x 2)     Status: None (Preliminary result)   Collection Time: 06/10/24  1:11 PM   Specimen: BLOOD  Result Value Ref Range Status   Specimen Description BLOOD RIGHT ANTECUBITAL  Final   Special Requests   Final    BOTTLES DRAWN AEROBIC AND ANAEROBIC Blood Culture results may not be optimal due to an inadequate volume of blood received in culture bottles   Culture   Final    NO GROWTH 3 DAYS Performed at [facility], [address], [place], [place] [address]    Report Status PENDING  Incomplete  Blood culture (routine x 2)     Status: None (Preliminary result)   Collection Time: 06/10/24  6:41 PM   Specimen: BLOOD RIGHT ARM  Result Value Ref Range Status   Specimen Description BLOOD RIGHT ARM  Final   Special Requests   Final    BOTTLES DRAWN AEROBIC AND ANAEROBIC Blood Culture results may not be optimal due to an inadequate volume of blood received in culture bottles   Culture   Final    NO GROWTH 3 DAYS Performed at [facility], [address] [place], [place] [address]    Report Status PENDING   Incomplete  Surgical PCR screen     Status: None   Collection Time: 06/10/24 11:59 PM   Specimen: Nasal Mucosa; Nasal Swab  Result Value Ref Range Status   MRSA, PCR NEGATIVE NEGATIVE Final   Staphylococcus aureus NEGATIVE NEGATIVE Final    Comment: (NOTE) The Xpert SA Assay (FDA approved for NASAL specimens in patients 19 years of age and older), is one component of a comprehensive surveillance program. It is not intended to diagnose infection nor to guide or monitor treatment. Performed at [facility], [address], [place], [place] [address]   Aerobic/Anaerobic Culture w Gram Stain (surgical/deep wound)     Status: None (Preliminary result)   Collection Time: 06/12/24  3:34 PM   Specimen: PATH Soft tissue  Result Value Ref Range Status   Specimen Description TISSUE  Final   Special Requests right heel bone  Final   Gram Stain NO WBC SEEN RARE GRAM POSITIVE COCCI IN PAIRS   Final   Culture   Final    TOO YOUNG TO READ Performed at [facility], [address] [place], [place] [address]    Report Status PENDING  Incomplete  Aerobic/Anaerobic Culture w Gram Stain (surgical/deep wound)     Status: None (Preliminary result)   Collection Time: 06/12/24  3:34 PM   Specimen: PATH Soft tissue  Result Value Ref Range Status   Specimen Description TISSUE  Final   Special Requests right heel swab  Final   Gram Stain NO WBC SEEN NO ORGANISMS SEEN  Final   Culture   Final    TOO YOUNG TO READ Performed at [facility], [address], [place], [place] [address]    Report Status PENDING  Incomplete    Radiology Studies: No results found.     Zaviyar Rahal T. Akshith Moncus [facility]  If 7PM-7AM, please contact night-coverage [email or web address] 06/13/2024, 2:41 PM

## 2024-06-13 NOTE — Evaluation (Signed)
 Occupational Therapy Evaluation Patient Details Name: Jasmine Richardson MRN: [id number] DOB: [birth year] Today's Date: 06/13/2024   History of Present Illness   Pt is a 63 y/o F presenting to ED on 6/25 with R heel wound from stepping on glass, s/p I & D on 6/27. PMH includes DM2 with neuropathy, CAD s/p CABG, PCI, HTN, HLD, ventricular tachycardia s/p AICD, obesity     Clinical Impressions Pt reports ind at baseline with ADL and uses cane/RW intermittently for functional mobility, lives with spouse who can provide PRN assist. Pt currently needing up to min A for ADLs, supervision for bed mobility and CGA for transfer with RW. Pt educated on wear of R postop shoe when OOB, pt verbalized understanding, able to don shoe using figure 4 technique. Pt presenting with impairments listed below, will follow acutely. Recommend HHOT at d/c.      If plan is discharge home, recommend the following:   A little help with walking and/or transfers;A little help with bathing/dressing/bathroom;Assistance with cooking/housework;Direct supervision/assist for medications management;Direct supervision/assist for financial management;Assist for transportation;Help with stairs or ramp for entrance     Functional Status Assessment   Patient has had a recent decline in their functional status and demonstrates the ability to make significant improvements in function in a reasonable and predictable amount of time.     Equipment Recommendations   None recommended by OT (pt has all needed DME)     Recommendations for Other Services   PT consult     Precautions/Restrictions   Precautions Precautions: Fall Restrictions Weight Bearing Restrictions Per Provider Order: Yes RLE Weight Bearing Per Provider Order: Weight bearing as tolerated Other Position/Activity Restrictions: in postop shoe     Mobility Bed Mobility Overal bed mobility: Needs Assistance Bed Mobility: Supine to Sit     Supine to sit:  Supervision          Transfers Overall transfer level: Needs assistance Equipment used: Rolling walker (2 wheels) Transfers: Sit to/from Stand Sit to Stand: Contact guard assist                  Balance Overall balance assessment: Needs assistance Sitting-balance support: Feet supported Sitting balance-Leahy Scale: Good Sitting balance - Comments: reaches outside BOS without LOB     Standing balance-Leahy Scale: Poor Standing balance comment: reliant on external support                           ADL either performed or assessed with clinical judgement   ADL Overall ADL's : Needs assistance/impaired Eating/Feeding: Set up;Sitting   Grooming: Set up;Standing   Upper Body Bathing: Minimal assistance;Standing;Sitting   Lower Body Bathing: Minimal assistance;Sitting/lateral leans;Sit to/from stand   Upper Body Dressing : Contact guard assist;Sitting   Lower Body Dressing: Minimal assistance;Sitting/lateral leans;Sit to/from stand   Toilet Transfer: Contact guard assist;Ambulation;Rolling walker (2 wheels);Regular Toilet   Toileting- Clothing Manipulation and Hygiene: Contact guard assist       Functional mobility during ADLs: Contact guard assist;Rolling walker (2 wheels)       Vision   Vision Assessment?: No apparent visual deficits     Perception Perception: Not tested       Praxis Praxis: Not tested       Pertinent Vitals/Pain Pain Assessment Pain Assessment: Faces Pain Score: 7  Faces Pain Scale: Hurts even more Pain Location: R foot Pain Descriptors / Indicators: Discomfort Pain Intervention(s): Limited activity within patient's tolerance, Monitored during session, Repositioned  Extremity/Trunk Assessment Upper Extremity Assessment Upper Extremity Assessment: Generalized weakness   Lower Extremity Assessment Lower Extremity Assessment: Defer to PT evaluation   Cervical / Trunk Assessment Cervical / Trunk Assessment:  Other exceptions (body habitus)   Communication Communication Communication: No apparent difficulties   Cognition Arousal: Alert Behavior During Therapy: WFL for tasks assessed/performed Cognition: No family/caregiver present to determine baseline             OT - Cognition Comments: flat affect overall, limited responses, but appears WFL, follows commands and provides PLOF appropriately.                 Following commands: Intact       Cueing  General Comments   Cueing Techniques: Verbal cues  VSS on RA   Exercises     Shoulder Instructions      Home Living Family/patient expects to be discharged to:: Private residence Living Arrangements: Spouse/significant other Available Help at Discharge: Family;Available PRN/intermittently Type of Home: House Home Access: Level entry     Home Layout: One level     Bathroom Shower/Tub: Producer, television/film/video: Standard     Home Equipment: Shower seat;Cane - single Librarian, academic (2 wheels)          Prior Functioning/Environment Prior Level of Function : Independent/Modified Independent;Driving             Mobility Comments: cane/RW PRN ADLs Comments: ind    OT Problem List: Decreased strength;Decreased range of motion;Decreased activity tolerance;Impaired balance (sitting and/or standing);Decreased cognition;Decreased safety awareness   OT Treatment/Interventions: Self-care/ADL training;Therapeutic exercise;Energy conservation;DME and/or AE instruction;Therapeutic activities;Balance training;Patient/family education;Cognitive remediation/compensation      OT Goals(Current goals can be found in the care plan section)   Acute Rehab OT Goals Patient Stated Goal: to eat bkfst OT Goal Formulation: With patient Time For Goal Achievement: 06/27/24 Potential to Achieve Goals: Good ADL Goals Pt Will Perform Upper Body Dressing: Independently;sitting Pt Will Perform Lower Body Dressing:  Independently;sitting/lateral leans;sit to/from stand Pt Will Transfer to Toilet: Independently;ambulating;regular height toilet Pt Will Perform Tub/Shower Transfer: Tub transfer;Shower transfer;Independently;shower seat;ambulating;rolling walker   OT Frequency:  Min 2X/week    Co-evaluation              AM-PAC OT 6 Clicks Daily Activity     Outcome Measure Help from another person eating meals?: A Little Help from another person taking care of personal grooming?: A Little Help from another person toileting, which includes using toliet, bedpan, or urinal?: A Little Help from another person bathing (including washing, rinsing, drying)?: A Little Help from another person to put on and taking off regular upper body clothing?: A Little Help from another person to put on and taking off regular lower body clothing?: A Little 6 Click Score: 18   End of Session Equipment Utilized During Treatment: Gait belt;Rolling walker (2 wheels) Nurse Communication: Mobility status  Activity Tolerance: Patient tolerated treatment well Patient left: in chair;with call bell/phone within reach;with chair alarm set  OT Visit Diagnosis: Unsteadiness on feet (R26.81);Other abnormalities of gait and mobility (R26.89);Muscle weakness (generalized) (M62.81)                Time: 9171-9147 OT Time Calculation (min): 24 min Charges:  OT General Charges $OT Visit: 1 Visit OT Evaluation $OT Eval Low Complexity: 1 Low OT Treatments $Self Care/Home Management : 8-22 mins  Lindbergh Winkles K, OTD, OTR/L SecureChat Preferred Acute Rehab [phone]   Laneta POUR Koonce 06/13/2024, 9:02  AM

## 2024-06-13 NOTE — Plan of Care (Signed)

## 2024-06-14 DIAGNOSIS — L089 Local infection of the skin and subcutaneous tissue, unspecified: Secondary | ICD-10-CM | POA: Diagnosis not present

## 2024-06-14 DIAGNOSIS — Z951 Presence of aortocoronary bypass graft: Secondary | ICD-10-CM | POA: Diagnosis not present

## 2024-06-14 DIAGNOSIS — Z95810 Presence of automatic (implantable) cardiac defibrillator: Secondary | ICD-10-CM | POA: Diagnosis not present

## 2024-06-14 DIAGNOSIS — L02611 Cutaneous abscess of right foot: Secondary | ICD-10-CM | POA: Diagnosis not present

## 2024-06-14 LAB — RENAL FUNCTION PANEL
Albumin: 2.2 g/dL — ABNORMAL LOW (ref 3.5–5.0)
Anion gap: 12 (ref 5–15)
BUN: 18 mg/dL (ref 8–23)
CO2: 22 mmol/L (ref 22–32)
Calcium: 9.4 mg/dL (ref 8.9–10.3)
Chloride: 105 mmol/L (ref 98–111)
Creatinine, Ser: 1.19 mg/dL — ABNORMAL HIGH (ref 0.44–1.00)
GFR, Estimated: 51 mL/min — ABNORMAL LOW (ref >60)
Glucose, Bld: 220 mg/dL — ABNORMAL HIGH (ref 70–99)
Phosphorus: 2.5 mg/dL (ref 2.5–4.6)
Potassium: 4.3 mmol/L (ref 3.5–5.1)
Sodium: 139 mmol/L (ref 135–145)

## 2024-06-14 LAB — CBC WITH DIFFERENTIAL/PLATELET
Abs Immature Granulocytes: 0.17 10*3/uL — ABNORMAL HIGH (ref 0.00–0.07)
Basophils Absolute: 0.1 10*3/uL (ref 0.0–0.1)
Basophils Relative: 0 %
Eosinophils Absolute: 0.1 10*3/uL (ref 0.0–0.5)
Eosinophils Relative: 0 %
HCT: 32.6 % — ABNORMAL LOW (ref 36.0–46.0)
Hemoglobin: 10.5 g/dL — ABNORMAL LOW (ref 12.0–15.0)
Immature Granulocytes: 1 %
Lymphocytes Relative: 7 %
Lymphs Abs: 1.4 10*3/uL (ref 0.7–4.0)
MCH: 29 pg (ref 26.0–34.0)
MCHC: 32.2 g/dL (ref 30.0–36.0)
MCV: 90.1 fL (ref 80.0–100.0)
Monocytes Absolute: 1 10*3/uL (ref 0.1–1.0)
Monocytes Relative: 5 %
Neutro Abs: 17.2 10*3/uL — ABNORMAL HIGH (ref 1.7–7.7)
Neutrophils Relative %: 87 %
Platelets: 404 10*3/uL — ABNORMAL HIGH (ref 150–400)
RBC: 3.62 MIL/uL — ABNORMAL LOW (ref 3.87–5.11)
RDW: 13.2 % (ref 11.5–15.5)
WBC: 19.9 10*3/uL — ABNORMAL HIGH (ref 4.0–10.5)
nRBC: 0 % (ref 0.0–0.2)

## 2024-06-14 LAB — MAGNESIUM: Magnesium: 2.1 mg/dL (ref 1.7–2.4)

## 2024-06-14 LAB — SEDIMENTATION RATE: Sed Rate: 31 mm/h — ABNORMAL HIGH (ref 0–22)

## 2024-06-14 LAB — GLUCOSE, CAPILLARY
Glucose-Capillary: 189 mg/dL — ABNORMAL HIGH (ref 70–99)
Glucose-Capillary: 207 mg/dL — ABNORMAL HIGH (ref 70–99)
Glucose-Capillary: 162 mg/dL — ABNORMAL HIGH (ref 70–99)
Glucose-Capillary: 127 mg/dL — ABNORMAL HIGH (ref 70–99)

## 2024-06-14 LAB — C-REACTIVE PROTEIN: CRP: 15.3 mg/dL — ABNORMAL HIGH (ref <1.0)

## 2024-06-14 MED ORDER — INSULIN ASPART 100 UNIT/ML IJ SOLN
0.0000 [IU] | Freq: Three times a day (TID) | INTRAMUSCULAR | Status: DC
  Administered 2024-06-14: 4 [IU] via SUBCUTANEOUS
  Administered 2024-06-15 – 2024-06-16 (×4): 3 [IU] via SUBCUTANEOUS

## 2024-06-14 MED ORDER — INSULIN ASPART 100 UNIT/ML IJ SOLN
5.0000 [IU] | Freq: Three times a day (TID) | INTRAMUSCULAR | Status: DC
  Administered 2024-06-14 – 2024-06-16 (×5): 5 [IU] via SUBCUTANEOUS

## 2024-06-14 MED ORDER — INSULIN GLARGINE-YFGN 100 UNIT/ML ~~LOC~~ SOLN
55.0000 [IU] | Freq: Every day | SUBCUTANEOUS | Status: DC
  Administered 2024-06-15 – 2024-06-17 (×3): 55 [IU] via SUBCUTANEOUS
  Filled 2024-06-14 (×3): qty 0.55

## 2024-06-14 NOTE — Progress Notes (Signed)
 PROGRESS NOTE  Jasmine Richardson FMW:[id number] DOB: Oct 02, 1961   PCP: Nicholaus Credit, PA-C  Patient is from: Home.  Uses rolling walker and cane at baseline.  DOA: 06/10/2024 LOS: 4  Chief complaints Chief Complaint  Patient presents with   Wound Infection    Pt.,s  Dr. Rozetta from [facility] contacted pt. Today to come to ED for a rt. Heel infection.       Brief Narrative / Interim history: 63 year old F with PMH of CAD/CABG in 2017 and PCI in 2021, HFpEF, VT s/p AICD, DM-2, PVD, obesity, HTN, HLD, neuropathy, left TMA amputation and right great toe amputation sent to ED from podiatry office due to right foot abscess.  Patient had a recent right heel wound after she stepped on a piece of glass . She saw podiatry on 6/16 and then 6/24.  Reportedly, small piece of glass was removed.  Continues to have worsening leukocytosis, pain and swelling despite 2 rounds of antibiotics with doxycycline  and then Bactrim .  She had I&D at podiatry office with removal of purulent material and sent to ED for further evaluation.  In ED, vital stable.  Leukocytosis to 22.5 with left shift.  CRP 11.2.  ESR 120.  Lactic acid negative.  Started on cefepime  and Zyvox .  MRI foot ordered.  Podiatry consulted.  MRI right foot limited by motion showed subacute displaced fracture to the displaces of the fifth metatarsal moderate to severe degenerative change to the tarsometatarsal joints and in the 2nd and 3rd MTP joint.    MRI right heel limited by motion raised concern for cellulitis but no abscess, plantar fasciitis with reactive calcaneal change and arthropathy along the Lisfranc joints.  Underwent I&D and bone biopsy by Dr. Pierce on 6/27.  Surgical culture including bone biopsy with GPC in pairs.  Antibiotic de-escalated to IV Unasyn after discussion with ID.   Subjective: Seen and examined earlier this morning.  No major events overnight or this morning.  Feels sore in her right heel.  No other  complaints.  Eager to go home  Objective: Vitals:   06/13/24 1340 06/13/24 1938 06/14/24 0419 06/14/24 0746  BP: (!) 141/78 114/61 (!) 110/56 131/74  Pulse: 100 99 82 79  Resp: 18 18 18 17   Temp: 98.5 F (36.9 C) 98.2 F (36.8 C) 98.7 F (37.1 C) 98.6 F (37 C)  TempSrc: Oral Oral Oral   SpO2: 98% 95% 96% 98%  Weight:      Height:        Examination:  GENERAL: No apparent distress.  Nontoxic. HEENT: MMM.  Vision and hearing grossly intact.  NECK: Supple.  No apparent JVD.  RESP:  No IWOB.  Fair aeration bilaterally. CVS:  RRR. Heart sounds normal.  ABD/GI/GU: BS+. Abd soft, NTND.  MSK/EXT:  S/p previous left TMA and right great toe amputation.  Right foot in Ace wrap  SKIN: no apparent skin lesion or wound NEURO: AA.  Oriented appropriately.  No apparent focal neuro deficit. PSYCH: Calm. Normal affect.   Consultants:  Podiatry  Procedures: 6/24-I&D at podiatry office 6/27-I&D and bone biopsy by Dr. Lavone  Microbiology summarized: 6/24-outpatient abscess culture with group B strep's 6/25-blood cultures NGTD 6/25-MRSA PCR screen nonreactive 6/27-right heel bone biopsy with GPC in pairs.  Assessment and plan: Right heel abscess/osteomyelitis: Reportedly stepped on a piece of glass does seem to have been removed by podiatry outpatient.  Presents with worsening swelling, pain.  Significant leukocytosis with markedly elevated inflammatory markers.  MRI foot  and heel as above.  Leukocytosis remains elevated. -S/p bedside I&D at podiatry office on 6/24.  Abscess culture with group B strep. -S/p I&D and heel bone biopsy by Dr. Pierce on 6/27.  Surgical culture with GPC in pairs. -Zosyn  and cefepime  6/25>> IV Unasyn 6/27 after discussion with ID>> -Per podiatry, WBAT in postop shoe.  Follow further recommendation for osteomyelitis -Needs formal ID consult once decision is made about surgical intervention -Follow CRP and ESR. -Pain and glycemic control -PT/OT  eval  IDDM-2 with hyperglycemia: A1c 8.9% (was 9.4% on 02/18/2024)..  On Toujeo  63 units daily, metformin , Ozempic  and Farxiga  at home. Recent Labs  Lab 06/13/24 1821 06/13/24 1913 06/13/24 2120 06/14/24 0610 06/14/24 1129  GLUCAP 197* 212* 210* 189* 207*  -Continue home Farxiga  -Increase Semglee  from 50 to 55 units daily -Increase SSI to resistant scale -Increase NovoLog  from 3 to 5 units 3 times daily with meals   Chronic diastolic CHF: TTE in 02/2023 with LVEF of 60 to 65%.  Appears euvolemic on exam. -Continue with home Lasix , metoprolol , Farxiga  and valsartan  -Monitor fluid status   History of CAD s/p CABG in 2017 and RCA PCI in 05/2020: No cardiopulmonary symptoms. -Holding Plavix  again given concern for osteo -Continue with statin and aspirin . -Continue with valsartan  and metoprolol    History of VT s/p AICD -Continue with amiodarone   Hypertension: Normotensive for most part. - Continue home regimen - Hydralazine  as needed   UTI: Frequency and dysuria on arrival.  UA concerning.  Urine culture was not sent.  Symptoms resolved. - Should be covered on antibiotic above.  Hyperlipidemia: On Repatha  outpatient. - Continue Crestor   Obesity class I Body mass index is 34.3 kg/m. -Continue home Ozempic  outpatient.           DVT prophylaxis:  heparin  injection 5,000 Units Start: 06/10/24 2200  Code Status: Full code Family Communication: None at bedside Level of care: Med-Surg Status is: Inpatient Remains inpatient appropriate because: Right foot infection   Final disposition: To be determined after therapy evaluation   55 minutes with more than 50% spent in reviewing records, counseling patient/family and coordinating care.   Sch Meds:  Scheduled Meds:  amiodarone   200 mg Oral Daily   aspirin   81 mg Oral Daily   clopidogrel   75 mg Oral Daily   dapagliflozin  propanediol  10 mg Oral Daily   furosemide   20 mg Oral Daily   heparin   5,000 Units Subcutaneous  Q8H   insulin  aspart  0-15 Units Subcutaneous TID WC   insulin  aspart  0-5 Units Subcutaneous QHS   insulin  aspart  5 Units Subcutaneous TID WC   [START ON 06/15/2024] insulin  glargine-yfgn  55 Units Subcutaneous Daily   irbesartan   75 mg Oral Daily   metoprolol  succinate  12.5 mg Oral Daily   rosuvastatin   20 mg Oral Daily   senna  1 tablet Oral Daily   Continuous Infusions:  ampicillin-sulbactam (UNASYN) IV 3 g (06/14/24 0548)   PRN Meds:.acetaminophen , calcium  carbonate, hydrALAZINE , nitroGLYCERIN , oxyCODONE   Antimicrobials: Anti-infectives (From admission, onward)    Start     Dose/Rate Route Frequency Ordered Stop   06/12/24 2359  Ampicillin-Sulbactam (UNASYN) 3 g in sodium chloride  0.9 % 100 mL IVPB        3 g 200 mL/hr over 30 Minutes Intravenous Every 6 hours 06/12/24 1555     06/11/24 1430  ceFEPIme  (MAXIPIME ) 2 g in sodium chloride  0.9 % 100 mL IVPB  Status:  Discontinued  2 g 200 mL/hr over 30 Minutes Intravenous Every 12 hours 06/11/24 0912 06/12/24 1554   06/11/24 0230  ceFEPIme  (MAXIPIME ) 2 g in sodium chloride  0.9 % 100 mL IVPB  Status:  Discontinued        2 g 200 mL/hr over 30 Minutes Intravenous Every 8 hours 06/10/24 1821 06/11/24 0912   06/10/24 2000  linezolid  (ZYVOX ) IVPB 600 mg  Status:  Discontinued        600 mg 300 mL/hr over 60 Minutes Intravenous Every 12 hours 06/10/24 1756 06/12/24 1554   06/10/24 1830  ceFEPIme  (MAXIPIME ) 2 g in sodium chloride  0.9 % 100 mL IVPB        2 g 200 mL/hr over 30 Minutes Intravenous  Once 06/10/24 1755 06/10/24 1937        I have personally reviewed the following labs and images: CBC: Recent Labs  Lab 06/09/24 1706 06/10/24 1311 06/11/24 0631 06/12/24 0656 06/13/24 0837 06/14/24 1116  WBC 22.6* 22.5* 17.0* 15.9* 20.1* 19.9*  NEUTROABS 19.0* 19.3*  --   --   --  17.2*  HGB 11.8 11.9* 10.4* 10.6* 10.3* 10.5*  HCT 36.5 36.6 32.6* 33.2* 31.9* 32.6*  MCV 89 89.1 89.3 88.5 89.9 90.1  PLT 403 433* 378 372  353 404*   BMP &GFR Recent Labs  Lab 06/10/24 1311 06/11/24 0631 06/12/24 0656 06/13/24 0837  NA 137 135 133* 137  K 4.4 4.3 4.3 4.9  CL 103 103 103 104  CO2 22 19* 23 22  GLUCOSE 264* 178* 138* 92  BUN 17 20 18 18   CREATININE 1.03* 1.24* 1.08* 1.14*  CALCIUM  9.6 8.9 9.0 9.2  MG  --   --  2.0 2.1  PHOS  --   --  3.1 2.8   Estimated Creatinine Clearance: 61.2 mL/min (A) (by C-G formula based on SCr of 1.14 mg/dL (H)). Liver & Pancreas: Recent Labs  Lab 06/10/24 1311 06/12/24 0656 06/13/24 0837  AST 11*  --   --   ALT 11  --   --   ALKPHOS 55  --   --   BILITOT 0.5  --   --   PROT 8.0  --   --   ALBUMIN 2.7* 2.3* 2.2*   No results for input(s): LIPASE, AMYLASE in the last 168 hours. No results for input(s): AMMONIA in the last 168 hours. Diabetic: No results for input(s): HGBA1C in the last 72 hours.  Recent Labs  Lab 06/13/24 1821 06/13/24 1913 06/13/24 2120 06/14/24 0610 06/14/24 1129  GLUCAP 197* 212* 210* 189* 207*   Cardiac Enzymes: No results for input(s): CKTOTAL, CKMB, CKMBINDEX, TROPONINI in the last 168 hours. No results for input(s): PROBNP in the last 8760 hours. Coagulation Profile: No results for input(s): INR, PROTIME in the last 168 hours. Thyroid Function Tests: No results for input(s): TSH, T4TOTAL, FREET4, T3FREE, THYROIDAB in the last 72 hours. Lipid Profile: No results for input(s): CHOL, HDL, LDLCALC, TRIG, CHOLHDL, LDLDIRECT in the last 72 hours. Anemia Panel: No results for input(s): VITAMINB12, FOLATE, FERRITIN, TIBC, IRON, RETICCTPCT in the last 72 hours. Urine analysis:    Component Value Date/Time   COLORURINE AMBER (A) 06/10/2024 1830   APPEARANCEUR CLOUDY (A) 06/10/2024 1830   LABSPEC 1.029 06/10/2024 1830   PHURINE 5.0 06/10/2024 1830   GLUCOSEU >=500 (A) 06/10/2024 1830   HGBUR LARGE (A) 06/10/2024 1830   BILIRUBINUR NEGATIVE 06/10/2024 1830   BILIRUBINUR negative  10/16/2023 1516   KETONESUR NEGATIVE 06/10/2024 1830  PROTEINUR >=300 (A) 06/10/2024 1830   UROBILINOGEN negative (A) 10/16/2023 1516   NITRITE POSITIVE (A) 06/10/2024 1830   LEUKOCYTESUR NEGATIVE 06/10/2024 1830   Sepsis Labs: Invalid input(s): PROCALCITONIN, LACTICIDVEN  Microbiology: Recent Results (from the past 240 hours)  WOUND CULTURE     Status: Abnormal   Collection Time: 06/09/24  3:58 PM   Specimen: Foot, Right; Wound   Wound  Result Value Ref Range Status   Gram Stain Result Final report  Final   Organism ID, Bacteria Comment  Final    Comment: No white blood cells seen.   Organism ID, Bacteria Comment  Final    Comment: Moderate number of gram positive cocci.   Aerobic Bacterial Culture Final report (A)  Final   Organism ID, Bacteria Comment (A)  Final    Comment: Beta hemolytic Streptococcus, group B Heavy growth Penicillin and ampicillin are drugs of choice for treatment of beta-hemolytic streptococcal infections. Susceptibility testing of penicillins and other beta-lactam agents approved by the FDA for treatment of beta-hemolytic streptococcal infections need not be performed routinely because nonsusceptible isolates are extremely rare in any beta-hemolytic streptococcus and have not been reported for Streptococcus pyogenes (group A). (CLSI)   Blood culture (routine x 2)     Status: None (Preliminary result)   Collection Time: 06/10/24  1:11 PM   Specimen: BLOOD  Result Value Ref Range Status   Specimen Description BLOOD RIGHT ANTECUBITAL  Final   Special Requests   Final    BOTTLES DRAWN AEROBIC AND ANAEROBIC Blood Culture results may not be optimal due to an inadequate volume of blood received in culture bottles   Culture   Final    NO GROWTH 4 DAYS Performed at [facility], [address], [place], [place] [address]    Report Status PENDING  Incomplete  Blood culture (routine x 2)     Status: None (Preliminary result)   Collection Time:  06/10/24  6:41 PM   Specimen: BLOOD RIGHT ARM  Result Value Ref Range Status   Specimen Description BLOOD RIGHT ARM  Final   Special Requests   Final    BOTTLES DRAWN AEROBIC AND ANAEROBIC Blood Culture results may not be optimal due to an inadequate volume of blood received in culture bottles   Culture   Final    NO GROWTH 4 DAYS Performed at [facility], [address], [place], [place] [address]    Report Status PENDING  Incomplete  Surgical PCR screen     Status: None   Collection Time: 06/10/24 11:59 PM   Specimen: Nasal Mucosa; Nasal Swab  Result Value Ref Range Status   MRSA, PCR NEGATIVE NEGATIVE Final   Staphylococcus aureus NEGATIVE NEGATIVE Final    Comment: (NOTE) The Xpert SA Assay (FDA approved for NASAL specimens in patients 44 years of age and older), is one component of a comprehensive surveillance program. It is not intended to diagnose infection nor to guide or monitor treatment. Performed at [facility], [address] [place], [place] [address]   Aerobic/Anaerobic Culture w Gram Stain (surgical/deep wound)     Status: None (Preliminary result)   Collection Time: 06/12/24  3:34 PM   Specimen: PATH Soft tissue  Result Value Ref Range Status   Specimen Description TISSUE  Final   Special Requests right heel bone  Final   Gram Stain NO WBC SEEN RARE GRAM POSITIVE COCCI IN PAIRS   Final   Culture   Final  TOO YOUNG TO READ Performed at [facility], [address], [place], [place] [address]    Report Status PENDING  Incomplete  Aerobic/Anaerobic Culture w Gram Stain (surgical/deep wound)     Status: None (Preliminary result)   Collection Time: 06/12/24  3:34 PM   Specimen: PATH Soft tissue  Result Value Ref Range Status   Specimen Description TISSUE  Final   Special Requests right heel swab  Final   Gram Stain NO WBC SEEN NO ORGANISMS SEEN   Final   Culture   Final    TOO YOUNG TO READ Performed at [facility], [address] [place], [place] [address]    Report Status PENDING  Incomplete    Radiology Studies: No results found.     Donaldo Teegarden T. Avagail Whittlesey [facility]  If 7PM-7AM, please contact night-coverage [email or web address] 06/14/2024, 12:30 PM

## 2024-06-14 NOTE — Progress Notes (Signed)
 Mobility Specialist: Progress Note   06/14/24 1537  Mobility  Activity Ambulated with assistance in hallway  Level of Assistance Contact guard assist, steadying assist  Assistive Device Front wheel walker  Distance Ambulated (ft) 125 ft  RLE Weight Bearing Per Provider Order WBAT  Activity Response Tolerated well  Mobility Referral Yes  Mobility visit 1 Mobility  Mobility Specialist Start Time (ACUTE ONLY) 1028  Mobility Specialist Stop Time (ACUTE ONLY) 1046  Mobility Specialist Time Calculation (min) (ACUTE ONLY) 18 min    Pt received on EOB, agreeable to mobility session. CG throughout. Min assist from MS to don RLE postop shoe. C/o postop shoe being ill-fitted and it rubbing up against her heel aduring ambulation. Ambulated to the window and back without fault. Returned to room without fault. Left on EOB with all needs met, call bell in reach.   Ileana Lute Mobility Specialist Please contact via SecureChat or Rehab office at [phone]

## 2024-06-14 NOTE — Plan of Care (Signed)

## 2024-06-15 ENCOUNTER — Ambulatory Visit: Admitting: Podiatry

## 2024-06-15 DIAGNOSIS — B951 Streptococcus, group B, as the cause of diseases classified elsewhere: Secondary | ICD-10-CM

## 2024-06-15 DIAGNOSIS — L089 Local infection of the skin and subcutaneous tissue, unspecified: Secondary | ICD-10-CM | POA: Diagnosis not present

## 2024-06-15 DIAGNOSIS — M86171 Other acute osteomyelitis, right ankle and foot: Secondary | ICD-10-CM

## 2024-06-15 DIAGNOSIS — Z951 Presence of aortocoronary bypass graft: Secondary | ICD-10-CM | POA: Diagnosis not present

## 2024-06-15 DIAGNOSIS — L03115 Cellulitis of right lower limb: Principal | ICD-10-CM

## 2024-06-15 DIAGNOSIS — L02611 Cutaneous abscess of right foot: Secondary | ICD-10-CM | POA: Diagnosis not present

## 2024-06-15 DIAGNOSIS — Z95810 Presence of automatic (implantable) cardiac defibrillator: Secondary | ICD-10-CM | POA: Diagnosis not present

## 2024-06-15 HISTORY — DX: Cellulitis of right lower limb: L03.115

## 2024-06-15 HISTORY — DX: Other acute osteomyelitis, right ankle and foot: M86.171

## 2024-06-15 LAB — RENAL FUNCTION PANEL
Albumin: 2.2 g/dL — ABNORMAL LOW (ref 3.5–5.0)
Anion gap: 12 (ref 5–15)
BUN: 14 mg/dL (ref 8–23)
CO2: 24 mmol/L (ref 22–32)
Calcium: 8.8 mg/dL — ABNORMAL LOW (ref 8.9–10.3)
Chloride: 101 mmol/L (ref 98–111)
Creatinine, Ser: 1.02 mg/dL — ABNORMAL HIGH (ref 0.44–1.00)
GFR, Estimated: >60 mL/min (ref >60)
Glucose, Bld: 184 mg/dL — ABNORMAL HIGH (ref 70–99)
Phosphorus: 2.8 mg/dL (ref 2.5–4.6)
Potassium: 4.5 mmol/L (ref 3.5–5.1)
Sodium: 137 mmol/L (ref 135–145)

## 2024-06-15 LAB — CBC
HCT: 30.7 % — ABNORMAL LOW (ref 36.0–46.0)
Hemoglobin: 9.7 g/dL — ABNORMAL LOW (ref 12.0–15.0)
MCH: 28.2 pg (ref 26.0–34.0)
MCHC: 31.6 g/dL (ref 30.0–36.0)
MCV: 89.2 fL (ref 80.0–100.0)
Platelets: 360 10*3/uL (ref 150–400)
RBC: 3.44 MIL/uL — ABNORMAL LOW (ref 3.87–5.11)
RDW: 13.2 % (ref 11.5–15.5)
WBC: 14.4 10*3/uL — ABNORMAL HIGH (ref 4.0–10.5)
nRBC: 0 % (ref 0.0–0.2)

## 2024-06-15 LAB — GLUCOSE, CAPILLARY
Glucose-Capillary: 94 mg/dL (ref 70–99)
Glucose-Capillary: 140 mg/dL — ABNORMAL HIGH (ref 70–99)
Glucose-Capillary: 136 mg/dL — ABNORMAL HIGH (ref 70–99)
Glucose-Capillary: 117 mg/dL — ABNORMAL HIGH (ref 70–99)

## 2024-06-15 LAB — CULTURE, BLOOD (ROUTINE X 2)
Culture: NO GROWTH
Culture: NO GROWTH

## 2024-06-15 LAB — MAGNESIUM: Magnesium: 1.8 mg/dL (ref 1.7–2.4)

## 2024-06-15 NOTE — Progress Notes (Signed)
  Subjective:  [name], female    DOB: 08-04-1961,  MRN: [id number]  Chief Complaint  Patient presents with   Wound Infection    Pt.,s  Dr. Rozetta from [facility] contacted pt. Today to come to ED for a rt. Heel infection.      DOS: 06/12/24 Procedure: 1. Incision and drainage below fascia level, right heel 2. Bone biopsy heel deep with trocar, right heel  63 y.o. female seen for post op check. She reports pain in right heel. We discussed findings from OR and plan to discuss amputation vs ongoing wound care and antibiotics pending pathology results. She is emotional regarding thought of needing amputation but is aware it may need to be done and in her best interest if not improving.   Review of Systems: Negative except as noted in the HPI. Denies N/V/F/Ch.   Objective:   Constitutional Well developed. Well nourished.  Vascular Foot warm and well perfused. Capillary refill normal to all digits.   No calf pain with palpation  Neurologic Normal speech. Oriented to person, place, and time. Epicritic sensation intact to right heel  Dermatologic Still with erythema though decreased at right heel around I&D site, mild maceration and necrosis of the incision and mild drainage   Orthopedic: Pain to palpation right heel   Radiographs: None post op  Pathology: pending  Micro: Right heel soft tissue: Group B strep, Right heel swab GBS  Assessment:   1. Cellulitis of right foot   Abscess of right heel and possible osteomyelitis right heel s/p I&D of abscess and bone biopsy right calcaneus  Plan:  Patient was evaluated and treated and all questions answered.  POD # 3 s/p above procedures R heel I&D - Leukocytosis is improving, as is Sed rate down to 31 from 125 - Concern for positive tissue culture from right heel, pathology pending. Will await calcaneal pathology result prior to decision on amputation vs wound care and antibiotic therapy.  - Dressing  changed begin every other day packing dressing change with 1/2 in iodoform packing covered with dry gauze dressing -XR: none taken post op -WB Status: WBAT in post op shoe -Sutures: None present. -Medications/ABX: Abx per ID, appreciate recs. I&D site soft tissue and swab Cx with Group B strep -Dressing: Changed today, will place orders for every other day dressing changes - F/u Plan: Will see patient tomorrow for further discussion of amputation vs ongoing wound care and iV abx therapy        Alex F Jlee Harkless, DPM [facility] / [facility]

## 2024-06-15 NOTE — Plan of Care (Signed)

## 2024-06-15 NOTE — Progress Notes (Signed)
 PROGRESS NOTE  Jasmine Richardson FMW:[id number] DOB: [birth year]   PCP: Nicholaus Credit, PA-C  Patient is from: Home.  Uses rolling walker and cane at baseline.  DOA: 06/10/2024 LOS: 5  Chief complaints Chief Complaint  Patient presents with   Wound Infection    Pt.,s  Dr. Rozetta from [facility] contacted pt. Today to come to ED for a rt. Heel infection.       Brief Narrative / Interim history: 63 year old F with PMH of CAD/CABG in 2017 and PCI in 2021, HFpEF, VT s/p AICD, DM-2, PVD, obesity, HTN, HLD, neuropathy, left TMA amputation and right great toe amputation sent to ED from podiatry office due to right foot abscess.  Patient had a recent right heel wound after she stepped on a piece of glass . She saw podiatry on 6/16 and then 6/24.  Reportedly, small piece of glass was removed.  Continues to have worsening leukocytosis, pain and swelling despite 2 rounds of antibiotics with doxycycline  and then Bactrim .  She had I&D at podiatry office with removal of purulent material and sent to ED for further evaluation.  In ED, vital stable.  Leukocytosis to 22.5 with left shift.  CRP 11.2.  ESR 120.  Lactic acid negative.  Started on cefepime  and Zyvox .  MRI foot ordered.  Podiatry consulted.  MRI right foot limited by motion showed subacute displaced fracture to the displaces of the fifth metatarsal moderate to severe degenerative change to the tarsometatarsal joints and in the 2nd and 3rd MTP joint.    MRI right heel limited by motion raised concern for cellulitis but no abscess, plantar fasciitis with reactive calcaneal change and arthropathy along the Lisfranc joints.  Underwent I&D and bone biopsy by Dr. Pierce on 6/27.  Surgical culture including bone biopsy with strep B.  On IV Unasyn per ID.  ID to formally consult.   Subjective: Seen and examined earlier this morning.  No major events overnight or this morning.  Feels sore in her right heel.  No other complaints.    Objective: Vitals:   06/14/24 1947 06/15/24 0542 06/15/24 0900 06/15/24 1450  BP: (!) 152/71 134/88 135/70 137/73  Pulse: 84 74 81 78  Resp: 18 18 18 18   Temp: 98.3 F (36.8 C) 99 F (37.2 C) 97.8 F (36.6 C) 97.8 F (36.6 C)  TempSrc: Oral Oral Oral Oral  SpO2:  97% 96% 96%  Weight:      Height:        Examination:  GENERAL: No apparent distress.  Nontoxic. HEENT: MMM.  Vision and hearing grossly intact.  NECK: Supple.  No apparent JVD.  RESP:  No IWOB.  Fair aeration bilaterally. CVS:  RRR. Heart sounds normal.  ABD/GI/GU: BS+. Abd soft, NTND.  MSK/EXT:  S/p previous left TMA and right great toe amputation.  Right foot in Ace wrap  SKIN: no apparent skin lesion or wound NEURO: AA.  Oriented appropriately.  No apparent focal neuro deficit. PSYCH: Calm. Normal affect.   Consultants:  Podiatry Infectious disease  Procedures: 6/24-I&D at podiatry office 6/27-I&D and bone biopsy by Dr. Lavone  Microbiology summarized: 6/24-outpatient abscess culture with group B strep's 6/25-blood cultures NGTD 6/25-MRSA PCR screen nonreactive 6/27-right heel bone biopsy with strep B.  Assessment and plan: Right heel abscess/osteomyelitis: Reportedly stepped on a piece of glass does seem to have been removed by podiatry outpatient.  Presents with worsening swelling, pain.  Significant leukocytosis with markedly elevated inflammatory markers.  MRI foot and heel as above.  Leukocytosis remains elevated. -S/p bedside I&D at podiatry office on 6/24.  Abscess culture with group B strep. -S/p I&D and heel bone biopsy by Dr. Pierce on 6/27.  Surgical culture with group B strep. -Zosyn  and cefepime  6/25>> IV Unasyn 6/27 after discussion with ID>> -Per podiatry, WBAT in postop shoe.  Follow further recommendation for osteomyelitis -ID formally consulted on 6/30 -Pain and glycemic control -PT/OT eval  IDDM-2 with hyperglycemia: A1c 8.9% (was 9.4% on 02/18/2024)..  On Toujeo  63 units  daily, metformin , Ozempic  and Farxiga  at home. Recent Labs  Lab 06/14/24 1129 06/14/24 1621 06/14/24 2134 06/15/24 0627 06/15/24 1114  GLUCAP 207* 162* 127* 94 140*  -Continue home Farxiga  -Continue Semglee  55 units daily -Continue SSI to resistant scale -Continue NovoLog  5 units 3 times daily with meals   Chronic diastolic CHF: TTE in 02/2023 with LVEF of 60 to 65%.  Appears euvolemic on exam. -Continue with home Lasix , metoprolol , Farxiga  and valsartan  -Monitor fluid status   History of CAD s/p CABG in 2017 and RCA PCI in 05/2020: No cardiopulmonary symptoms. -Holding Plavix  again given concern for osteo -Continue with statin and aspirin . -Continue with valsartan  and metoprolol    History of VT s/p AICD -Continue with amiodarone   Hypertension: Normotensive for most part. - Continue home regimen - Hydralazine  as needed   UTI: Frequency and dysuria on arrival.  UA concerning.  Urine culture was not sent.  Symptoms resolved. - Should be covered on antibiotic above.  Hyperlipidemia: On Repatha  outpatient. - Continue Crestor   Obesity class I Body mass index is 34.3 kg/m. -Continue home Ozempic  outpatient.           DVT prophylaxis:  heparin  injection 5,000 Units Start: 06/10/24 2200  Code Status: Full code Family Communication: None at bedside Level of care: Med-Surg Status is: Inpatient Remains inpatient appropriate because: Right foot infection   Final disposition: To be determined after therapy evaluation   55 minutes with more than 50% spent in reviewing records, counseling patient/family and coordinating care.   Sch Meds:  Scheduled Meds:  amiodarone   200 mg Oral Daily   aspirin   81 mg Oral Daily   dapagliflozin  propanediol  10 mg Oral Daily   furosemide   20 mg Oral Daily   heparin   5,000 Units Subcutaneous Q8H   insulin  aspart  0-20 Units Subcutaneous TID WC   insulin  aspart  0-5 Units Subcutaneous QHS   insulin  aspart  5 Units Subcutaneous TID  WC   insulin  glargine-yfgn  55 Units Subcutaneous Daily   irbesartan   75 mg Oral Daily   metoprolol  succinate  12.5 mg Oral Daily   rosuvastatin   20 mg Oral Daily   senna  1 tablet Oral Daily   Continuous Infusions:  ampicillin-sulbactam (UNASYN) IV 3 g (06/15/24 1127)   PRN Meds:.acetaminophen , calcium  carbonate, hydrALAZINE , nitroGLYCERIN , oxyCODONE   Antimicrobials: Anti-infectives (From admission, onward)    Start     Dose/Rate Route Frequency Ordered Stop   06/12/24 2359  Ampicillin-Sulbactam (UNASYN) 3 g in sodium chloride  0.9 % 100 mL IVPB        3 g 200 mL/hr over 30 Minutes Intravenous Every 6 hours 06/12/24 1555     06/11/24 1430  ceFEPIme  (MAXIPIME ) 2 g in sodium chloride  0.9 % 100 mL IVPB  Status:  Discontinued        2 g 200 mL/hr over 30 Minutes Intravenous Every 12 hours 06/11/24 0912 06/12/24 1554   06/11/24 0230  ceFEPIme  (MAXIPIME ) 2 g in sodium chloride  0.9 %  100 mL IVPB  Status:  Discontinued        2 g 200 mL/hr over 30 Minutes Intravenous Every 8 hours 06/10/24 1821 06/11/24 0912   06/10/24 2000  linezolid  (ZYVOX ) IVPB 600 mg  Status:  Discontinued        600 mg 300 mL/hr over 60 Minutes Intravenous Every 12 hours 06/10/24 1756 06/12/24 1554   06/10/24 1830  ceFEPIme  (MAXIPIME ) 2 g in sodium chloride  0.9 % 100 mL IVPB        2 g 200 mL/hr over 30 Minutes Intravenous  Once 06/10/24 1755 06/10/24 1937        I have personally reviewed the following labs and images: CBC: Recent Labs  Lab 06/09/24 1706 06/10/24 1311 06/10/24 1311 06/11/24 0631 06/12/24 0656 06/13/24 0837 06/14/24 1116 06/15/24 0928  WBC 22.6* 22.5*   < > 17.0* 15.9* 20.1* 19.9* 14.4*  NEUTROABS 19.0* 19.3*  --   --   --   --  17.2*  --   HGB 11.8 11.9*   < > 10.4* 10.6* 10.3* 10.5* 9.7*  HCT 36.5 36.6   < > 32.6* 33.2* 31.9* 32.6* 30.7*  MCV 89 89.1   < > 89.3 88.5 89.9 90.1 89.2  PLT 403 433*   < > 378 372 353 404* 360   < > = values in this interval not displayed.   BMP  &GFR Recent Labs  Lab 06/11/24 0631 06/12/24 0656 06/13/24 0837 06/14/24 1116 06/15/24 0928  NA 135 133* 137 139 137  K 4.3 4.3 4.9 4.3 4.5  CL 103 103 104 105 101  CO2 19* 23 22 22 24   GLUCOSE 178* 138* 92 220* 184*  BUN 20 18 18 18 14   CREATININE 1.24* 1.08* 1.14* 1.19* 1.02*  CALCIUM  8.9 9.0 9.2 9.4 8.8*  MG  --  2.0 2.1 2.1 1.8  PHOS  --  3.1 2.8 2.5 2.8   Estimated Creatinine Clearance: 68.4 mL/min (A) (by C-G formula based on SCr of 1.02 mg/dL (H)). Liver & Pancreas: Recent Labs  Lab 06/10/24 1311 06/12/24 0656 06/13/24 0837 06/14/24 1116 06/15/24 0928  AST 11*  --   --   --   --   ALT 11  --   --   --   --   ALKPHOS 55  --   --   --   --   BILITOT 0.5  --   --   --   --   PROT 8.0  --   --   --   --   ALBUMIN 2.7* 2.3* 2.2* 2.2* 2.2*   No results for input(s): LIPASE, AMYLASE in the last 168 hours. No results for input(s): AMMONIA in the last 168 hours. Diabetic: No results for input(s): HGBA1C in the last 72 hours.  Recent Labs  Lab 06/14/24 1129 06/14/24 1621 06/14/24 2134 06/15/24 0627 06/15/24 1114  GLUCAP 207* 162* 127* 94 140*   Cardiac Enzymes: No results for input(s): CKTOTAL, CKMB, CKMBINDEX, TROPONINI in the last 168 hours. No results for input(s): PROBNP in the last 8760 hours. Coagulation Profile: No results for input(s): INR, PROTIME in the last 168 hours. Thyroid Function Tests: No results for input(s): TSH, T4TOTAL, FREET4, T3FREE, THYROIDAB in the last 72 hours. Lipid Profile: No results for input(s): CHOL, HDL, LDLCALC, TRIG, CHOLHDL, LDLDIRECT in the last 72 hours. Anemia Panel: No results for input(s): VITAMINB12, FOLATE, FERRITIN, TIBC, IRON, RETICCTPCT in the last 72 hours. Urine analysis:    Component Value  Date/Time   COLORURINE AMBER (A) 06/10/2024 1830   APPEARANCEUR CLOUDY (A) 06/10/2024 1830   LABSPEC 1.029 06/10/2024 1830   PHURINE 5.0 06/10/2024 1830    GLUCOSEU >=500 (A) 06/10/2024 1830   HGBUR LARGE (A) 06/10/2024 1830   BILIRUBINUR NEGATIVE 06/10/2024 1830   BILIRUBINUR negative 10/16/2023 1516   KETONESUR NEGATIVE 06/10/2024 1830   PROTEINUR >=300 (A) 06/10/2024 1830   UROBILINOGEN negative (A) 10/16/2023 1516   NITRITE POSITIVE (A) 06/10/2024 1830   LEUKOCYTESUR NEGATIVE 06/10/2024 1830   Sepsis Labs: Invalid input(s): PROCALCITONIN, LACTICIDVEN  Microbiology: Recent Results (from the past 240 hours)  WOUND CULTURE     Status: Abnormal   Collection Time: 06/09/24  3:58 PM   Specimen: Foot, Right; Wound   Wound  Result Value Ref Range Status   Gram Stain Result Final report  Final   Organism ID, Bacteria Comment  Final    Comment: No white blood cells seen.   Organism ID, Bacteria Comment  Final    Comment: Moderate number of gram positive cocci.   Aerobic Bacterial Culture Final report (A)  Final   Organism ID, Bacteria Comment (A)  Final    Comment: Beta hemolytic Streptococcus, group B Heavy growth Penicillin and ampicillin are drugs of choice for treatment of beta-hemolytic streptococcal infections. Susceptibility testing of penicillins and other beta-lactam agents approved by the FDA for treatment of beta-hemolytic streptococcal infections need not be performed routinely because nonsusceptible isolates are extremely rare in any beta-hemolytic streptococcus and have not been reported for Streptococcus pyogenes (group A). (CLSI)   Blood culture (routine x 2)     Status: None   Collection Time: 06/10/24  1:11 PM   Specimen: BLOOD  Result Value Ref Range Status   Specimen Description BLOOD RIGHT ANTECUBITAL  Final   Special Requests   Final    BOTTLES DRAWN AEROBIC AND ANAEROBIC Blood Culture results may not be optimal due to an inadequate volume of blood received in culture bottles   Culture   Final    NO GROWTH 5 DAYS Performed at [facility], [address], [place], [place] [address]    Report  Status 06/15/2024 FINAL  Final  Blood culture (routine x 2)     Status: None   Collection Time: 06/10/24  6:41 PM   Specimen: BLOOD RIGHT ARM  Result Value Ref Range Status   Specimen Description BLOOD RIGHT ARM  Final   Special Requests   Final    BOTTLES DRAWN AEROBIC AND ANAEROBIC Blood Culture results may not be optimal due to an inadequate volume of blood received in culture bottles   Culture   Final    NO GROWTH 5 DAYS Performed at [facility], [address], [place], [place] [address]    Report Status 06/15/2024 FINAL  Final  Surgical PCR screen     Status: None   Collection Time: 06/10/24 11:59 PM   Specimen: Nasal Mucosa; Nasal Swab  Result Value Ref Range Status   MRSA, PCR NEGATIVE NEGATIVE Final   Staphylococcus aureus NEGATIVE NEGATIVE Final    Comment: (NOTE) The Xpert SA Assay (FDA approved for NASAL specimens in patients 20 years of age and older), is one component of a comprehensive surveillance program. It is not intended to diagnose infection nor to guide or monitor treatment. Performed at [facility], [address] [place], [place] [address]   Aerobic/Anaerobic Culture w Gram Stain (surgical/deep wound)     Status: None (  Preliminary result)   Collection Time: 06/12/24  3:34 PM   Specimen: PATH Soft tissue  Result Value Ref Range Status   Specimen Description TISSUE  Final   Special Requests right heel bone  Final   Gram Stain   Final    NO WBC SEEN RARE GRAM POSITIVE COCCI IN PAIRS Performed at [facility], [address], [place], [place] [address]    Culture   Final    FEW GROUP B STREP(S.AGALACTIAE)ISOLATED TESTING AGAINST S. AGALACTIAE NOT ROUTINELY PERFORMED DUE TO PREDICTABILITY OF AMP/PEN/VAN SUSCEPTIBILITY. NO ANAEROBES ISOLATED; CULTURE IN PROGRESS FOR 5 DAYS    Report Status PENDING  Incomplete  Aerobic/Anaerobic Culture w Gram Stain (surgical/deep wound)     Status: None (Preliminary result)   Collection Time:  06/12/24  3:34 PM   Specimen: PATH Soft tissue  Result Value Ref Range Status   Specimen Description TISSUE  Final   Special Requests right heel swab  Final   Gram Stain   Final    NO WBC SEEN NO ORGANISMS SEEN Performed at [facility], [address], [place], [place] [address]    Culture   Final    FEW GROUP B STREP(S.AGALACTIAE)ISOLATED TESTING AGAINST S. AGALACTIAE NOT ROUTINELY PERFORMED DUE TO PREDICTABILITY OF AMP/PEN/VAN SUSCEPTIBILITY. NO ANAEROBES ISOLATED; CULTURE IN PROGRESS FOR 5 DAYS    Report Status PENDING  Incomplete    Radiology Studies: No results found.     Berneita Sanagustin T. Megan Hayduk [facility]  If 7PM-7AM, please contact night-coverage [email or web address] 06/15/2024, 3:11 PM

## 2024-06-15 NOTE — Plan of Care (Signed)

## 2024-06-15 NOTE — Consult Note (Signed)
 [facility]    Date of Admission:  06/10/2024     Total days of antibiotics 6               Reason for Consult: Right foot abscess   Referring Provider: Dr. Kathrin  Primary Care Provider: Nicholaus Credit, PA-C   ASSESSMENT:  Jasmine Richardson is a 63 year old Caucasian female with type 2 diabetes, and peripheral vascular disease presenting with right foot infection following stepping on glass status post removal and found to have right calcaneal abscess with concern for osteomyelitis.  Status post debridement with cultures positive for group B streptococcus.  Postop day 3 and tolerating antibiotics with no adverse side effects.  Discussed plan of care to continue with antimicrobial therapy and will change to ampicillin-sulbactam for group B streptococcus.  Await surgical pathology to determine presence of osteomyelitis and need for surgical intervention versus wound care.  Optimize protein intake for healing and diabetes control.  Postoperative wound care per podiatry.  Standard/universal precautions.  Remaining medical and supportive care per internal medicine.  PLAN:  Change antibiotics to ampicillin-sulbactam. Monitor surgical specimens for additional organisms and sensitivities and adjust antibiotics as appropriate. Postoperative wound care per podiatry. Await surgical pathology report to determine additional surgical intervention and duration of treatment. Standard/universal precautions. Optimize protein intake and diabetes control. Remaining medical and supportive care per internal medicine.   Principal Problem:   Right foot infection Active Problems:   S/P CABG (coronary artery bypass graft)   Tobacco use disorder   Ventricular tachycardia   ICD (implantable cardioverter-defibrillator) in place   Obesity (BMI 30-39.9)   Uncontrolled type 2 diabetes mellitus with hyperglycemia (HCC)   Abscess of right foot   Abscess of bursa of right foot     amiodarone   200 mg Oral Daily   aspirin   81 mg Oral Daily   dapagliflozin  propanediol  10 mg Oral Daily   furosemide   20 mg Oral Daily   heparin   5,000 Units Subcutaneous Q8H   insulin  aspart  0-20 Units Subcutaneous TID WC   insulin  aspart  0-5 Units Subcutaneous QHS   insulin  aspart  5 Units Subcutaneous TID WC   insulin  glargine-yfgn  55 Units Subcutaneous Daily   irbesartan   75 mg Oral Daily   metoprolol  succinate  12.5 mg Oral Daily   rosuvastatin   20 mg Oral Daily   senna  1 tablet Oral Daily     HPI: Jasmine Richardson is a 63 y.o. female with previous medical history of type 2 diabetes and status post left transmetatarsal amputation and right great toe amputation, status post AICD placement, peripheral vascular disease, and hypertension presenting with concern for right foot abscess.  [name] initially presented to urgent care on 05/24/2024 with right foot swelling around the heel.  Diagnosed with cellulitis and started on doxycycline . Seen by Podiatry 05/27/24 with x-rays showing a spiral fracture of the fifth metatarsal along the medial one third aspect.  Diagnosed with cellulitis and nondisplaced fracture of the fifth metatarsal and antibiotics changed to sulfamethoxazole -trimethoprim .  Followed up on 06/01/2024 with continued right heel pain and erythema that had spread to the distal parts of the foot.  Debridement of the area was performed and encountered a piece of foreign material which was removed.  Scant amount of purulence noted with removal of the foreign body.  Course of Bactrim  was extended.  On 06/09/2024 erythema may have improved slightly with continued pain.  Wound was once again debrided  with purulent drainage noted and culture obtained which grew group B streptococcus.  Sent to the ED for further evaluation given continued pain, redness, and worsening leukocytosis.  Afebrile on admission with white blood cell count of 22,600.  MRI of the right foot with subacute displaced  fracture to the diaphysis of the fifth metatarsal MRI right heel with abnormal plantar edema along the heel and no drainable abscess concerning for cellulitis.  Brought to the OR on 06/12/2024 for debridement and bone biopsy.  IntraOp findings confirmatory for abscess in the medial plantar aspect of the right heel with recommendation for extended duration of antibiotics.  Surgical specimens obtained positive for group B streptococcus.  Blood cultures obtained on 06/10/2024 with no growth to date.  Currently on day 6 of antimicrobial therapy biotics narrowed to ampicillin-sulbactam following 3 days of linezolid  and cefepime .  Review of Systems: Review of Systems  Constitutional:  Negative for chills, fever and weight loss.  Respiratory:  Negative for cough, shortness of breath and wheezing.   Cardiovascular:  Negative for chest pain and leg swelling.  Gastrointestinal:  Negative for abdominal pain, constipation, diarrhea, nausea and vomiting.  Musculoskeletal:        Positive for right foot pain  Skin:  Negative for rash.     Past Medical History:  Diagnosis Date   Abnormal EKG 10/23/2021   Abnormal mammogram 08/09/2023   Abnormal mammogram of left breast 09/04/2021   Abnormal stress test 10/23/2021   Abscess of left elbow 02/18/2024   Absolute anemia 09/20/2022   Acquired varus deformity of foot, left    Acute chest pain 05/08/2016   Acute hemorrhagic cystitis 08/09/2023   AICD (automatic cardioverter/defibrillator) present    St Jude/Abbott device   AKI (acute kidney injury) (HCC) 10/23/2021   Amputation of right great toe (HCC) 09/20/2022   Angina pectoris (HCC) 09/11/2017   Anxiety 10/23/2021   B12 deficiency 09/20/2022   Bacteremia 11/22/2021   Breast wound, right, subsequent encounter 09/04/2021   Burping 05/08/2016   Cancer (HCC)    cervical - hysterectomy   Cellulitis of right breast 10/23/2021   Cellulitis, wound, post-operative 10/09/2021   Chronic diastolic heart  failure (HCC) 88/78/7982   Chronic osteomyelitis of left foot (HCC) 10/16/2023   Colon cancer screening 09/20/2022   Coronary artery disease involving native coronary artery of native heart with angina pectoris (HCC) 09/12/2015   Overview:  PCI and stent of RCA 2009, last cath 2012 with medical therapy  CABG May 2017   Demand ischemia [facility])    Diabetes mellitus due to underlying condition with hyperglycemia, with long-term current use of insulin  (HCC) 09/20/2022   Diabetic foot infection (HCC) 11/21/2021   Diabetic foot ulcer (HCC) 02/16/2020   Emphysema lung (HCC) 09/11/2017   patient not aware of this dx   Encounter for preoperative assessment 10/16/2023   Essential hypertension 09/12/2015   Gangrene (HCC) 10/23/2021   GERD (gastroesophageal reflux disease) 05/08/2016   Hematoma 10/23/2021   Hiatal hernia 10/23/2021   History of methicillin resistant staphylococcus aureus (MRSA)    Hyperglycemia 10/23/2021   Hyperglycemia due to type 2 diabetes mellitus (HCC) 10/23/2021   Hyperkalemia    Hyperlipidemia 09/12/2015   Hyponatremia 10/23/2021   ICD (implantable cardioverter-defibrillator) in place 05/16/2021   Infection of great toe 10/23/2021   Intertrigo 09/20/2022   Lactic acidosis 10/23/2021   Leukocytosis 10/23/2021   Mediastinitis 06/28/2016   Morbid obesity (HCC) 05/08/2016   Myocardial infarction [facility])    Needs flu shot 09/20/2022  NSTEMI (non-ST elevated myocardial infarction) (HCC) 05/22/2020   Obesity (BMI 30-39.9) 10/23/2021   Partial nontraumatic amputation of left foot (HCC) 09/20/2022   Perineal abscess 10/23/2021   Peripheral vascular disease (HCC)    S/P CABG (coronary artery bypass graft) 06/03/2016   Overview:  The patient underwent sternal reconstruction on 06/21/16 with pec flaps for mediastinitis from a prior CABG in May 2017. On admission, she was critically ill from sepsis and had altered mental status. She was last seen in clinic on 08/09/16 at which time  she was doing well.   Severe sepsis (HCC) 06/03/2016   Sinus tachycardia 05/08/2016   Tobacco use disorder 04/20/2016   Overview:  Quit in May 2017.   Type 2 diabetes mellitus with foot ulcer (HCC) 05/08/2016   Uncontrolled type 2 diabetes mellitus with hyperglycemia (HCC) 02/18/2024   Unstable angina (HCC) 05/22/2020   Ventricular tachycardia (HCC) 12/17/2020   Vitamin D  deficiency 09/20/2022   Wound, surgical, infected 06/06/2016   Overview:  sternal    Social History   Tobacco Use   Smoking status: Former    Current packs/day: 0.00    Types: Cigarettes    Quit date: 05/2016    Years since quitting: 8.0   Smokeless tobacco: Never  Vaping Use   Vaping status: Never Used  Substance Use Topics   Alcohol use: Yes    Comment: rare   Drug use: No    Family History  Problem Relation Age of Onset   Hypertension Mother    Hyperlipidemia Mother    Heart attack Father    Heart disease Father    Hypertension Father    Alzheimer's disease Father    Hypertension Brother    Hyperlipidemia Brother     Allergies  Allergen Reactions   Vancomycin  Anaphylaxis   Chlorhexidine  Gluconate Itching    Received CHG bath, began itching, required benadryl    Cat Dander Other (See Comments)    Sneezing, watery eyes.   Other Itching    Dial  Soap   Tramadol  Other (See Comments)    Hallucinations   Codeine Rash    OBJECTIVE: Blood pressure 134/88, pulse 74, temperature 99 F (37.2 C), temperature source Oral, resp. rate 18, height 5' 7 (1.702 m), weight 99.3 kg, SpO2 97%.  Physical Exam Constitutional:      General: She is not in acute distress.    Appearance: She is well-developed.     Comments: Lying in bed with head of bed elevated; pleasant   Cardiovascular:     Rate and Rhythm: Normal rate and regular rhythm.     Heart sounds: Normal heart sounds.  Pulmonary:     Effort: Pulmonary effort is normal.     Breath sounds: Normal breath sounds.   Musculoskeletal:      Comments: Surgical dressing intact and is clean and dry.   Skin:    General: Skin is warm and dry.   Neurological:     Mental Status: She is alert and oriented to person, place, and time.     Lab Results Lab Results  Component Value Date   WBC 19.9 (H) 06/14/2024   HGB 10.5 (L) 06/14/2024   HCT 32.6 (L) 06/14/2024   MCV 90.1 06/14/2024   PLT 404 (H) 06/14/2024    Lab Results  Component Value Date   CREATININE 1.19 (H) 06/14/2024   BUN 18 06/14/2024   NA 139 06/14/2024   K 4.3 06/14/2024   CL 105 06/14/2024   CO2 22 06/14/2024  Lab Results  Component Value Date   ALT 11 06/10/2024   AST 11 (L) 06/10/2024   ALKPHOS 55 06/10/2024   BILITOT 0.5 06/10/2024     Microbiology: Recent Results (from the past 240 hours)  WOUND CULTURE     Status: Abnormal   Collection Time: 06/09/24  3:58 PM   Specimen: Foot, Right; Wound   Wound  Result Value Ref Range Status   Gram Stain Result Final report  Final   Organism ID, Bacteria Comment  Final    Comment: No white blood cells seen.   Organism ID, Bacteria Comment  Final    Comment: Moderate number of gram positive cocci.   Aerobic Bacterial Culture Final report (A)  Final   Organism ID, Bacteria Comment (A)  Final    Comment: Beta hemolytic Streptococcus, group B Heavy growth Penicillin and ampicillin are drugs of choice for treatment of beta-hemolytic streptococcal infections. Susceptibility testing of penicillins and other beta-lactam agents approved by the FDA for treatment of beta-hemolytic streptococcal infections need not be performed routinely because nonsusceptible isolates are extremely rare in any beta-hemolytic streptococcus and have not been reported for Streptococcus pyogenes (group A). (CLSI)   Blood culture (routine x 2)     Status: None   Collection Time: 06/10/24  1:11 PM   Specimen: BLOOD  Result Value Ref Range Status   Specimen Description BLOOD RIGHT ANTECUBITAL  Final   Special Requests   Final     BOTTLES DRAWN AEROBIC AND ANAEROBIC Blood Culture results may not be optimal due to an inadequate volume of blood received in culture bottles   Culture   Final    NO GROWTH 5 DAYS Performed at [facility], [address] [place], [place] [address]    Report Status 06/15/2024 FINAL  Final  Blood culture (routine x 2)     Status: None   Collection Time: 06/10/24  6:41 PM   Specimen: BLOOD RIGHT ARM  Result Value Ref Range Status   Specimen Description BLOOD RIGHT ARM  Final   Special Requests   Final    BOTTLES DRAWN AEROBIC AND ANAEROBIC Blood Culture results may not be optimal due to an inadequate volume of blood received in culture bottles   Culture   Final    NO GROWTH 5 DAYS Performed at [facility], [address] [place], [place] [address]    Report Status 06/15/2024 FINAL  Final  Surgical PCR screen     Status: None   Collection Time: 06/10/24 11:59 PM   Specimen: Nasal Mucosa; Nasal Swab  Result Value Ref Range Status   MRSA, PCR NEGATIVE NEGATIVE Final   Staphylococcus aureus NEGATIVE NEGATIVE Final    Comment: (NOTE) The Xpert SA Assay (FDA approved for NASAL specimens in patients 29 years of age and older), is one component of a comprehensive surveillance program. It is not intended to diagnose infection nor to guide or monitor treatment. Performed at [facility], [address] [place], [place] [address]   Aerobic/Anaerobic Culture w Gram Stain (surgical/deep wound)     Status: None (Preliminary result)   Collection Time: 06/12/24  3:34 PM   Specimen: PATH Soft tissue  Result Value Ref Range Status   Specimen Description TISSUE  Final   Special Requests right heel bone  Final   Gram Stain   Final    NO WBC SEEN RARE GRAM POSITIVE COCCI IN PAIRS Performed at [facility], [address] [place], [place] [address]    Culture   Final    FEW GROUP B STREP(S.AGALACTIAE)ISOLATED TESTING AGAINST S. AGALACTIAE NOT ROUTINELY PERFORMED  DUE TO PREDICTABILITY OF AMP/PEN/VAN SUSCEPTIBILITY. NO ANAEROBES ISOLATED; CULTURE IN PROGRESS FOR 5 DAYS    Report Status PENDING  Incomplete  Aerobic/Anaerobic Culture w Gram Stain (surgical/deep wound)     Status: None (Preliminary result)   Collection Time: 06/12/24  3:34 PM   Specimen: PATH Soft tissue  Result Value Ref Range Status   Specimen Description TISSUE  Final   Special Requests right heel swab  Final   Gram Stain   Final    NO WBC SEEN NO ORGANISMS SEEN Performed at [facility], [address], [place], [place] [address]    Culture   Final    FEW GROUP B STREP(S.AGALACTIAE)ISOLATED TESTING AGAINST S. AGALACTIAE NOT ROUTINELY PERFORMED DUE TO PREDICTABILITY OF AMP/PEN/VAN SUSCEPTIBILITY. NO ANAEROBES ISOLATED; CULTURE IN PROGRESS FOR 5 DAYS    Report Status PENDING  Incomplete   I have personally spent 35 minutes involved in face-to-face and non-face-to-face activities for this patient on the day of the visit. Professional time spent includes the following activities: preparing to see the patient (review of tests), obtaining and reviewing separately obtained history (admission/discharge record), performing a medically appropriate examination, ordering medications, communicating with other health care professionals, documenting clinical information in the EMR, communicating results and counseling patient regarding medication and plan of care, and care coordination.    Greg Tyrell Brereton, NP [facility]  06/15/2024  8:28 AM

## 2024-06-15 NOTE — Progress Notes (Signed)
 Occupational Therapy Treatment Patient Details Name: Jasmine Richardson MRN: [id number] DOB: [birth year] Today's Date: 06/15/2024   History of present illness Pt is a 63 y/o F presenting to ED on 6/25 with R heel wound from stepping on glass, s/p I & D on 6/27. PMH includes DM2 with neuropathy, CAD s/p CABG, PCI, HTN, HLD, ventricular tachycardia s/p AICD, obesity   OT comments  Pt making incremental progress towards OT goals. Pt able to manage bathroom mobility, toileting tasks, post op shoe mgmt and brief bathing standing at sink with no more than CGA. Discussed protection of R foot w/ showering at home, WBAT orders and energy conservation given new fatigue w/ tasks post operatively. Continue to recommend HHOT at DC.      If plan is discharge home, recommend the following:  A little help with walking and/or transfers;A little help with bathing/dressing/bathroom;Assistance with cooking/housework;Direct supervision/assist for medications management;Direct supervision/assist for financial management;Assist for transportation;Help with stairs or ramp for entrance   Equipment Recommendations  None recommended by OT    Recommendations for Other Services      Precautions / Restrictions Precautions Precautions: Fall Recall of Precautions/Restrictions: Intact Restrictions Weight Bearing Restrictions Per Provider Order: Yes RLE Weight Bearing Per Provider Order: Weight bearing as tolerated Other Position/Activity Restrictions: in postop shoe       Mobility Bed Mobility               General bed mobility comments: EOB on entry and exit    Transfers Overall transfer level: Needs assistance Equipment used: Rolling walker (2 wheels) Transfers: Sit to/from Stand Sit to Stand: Supervision           General transfer comment: from bedside and toilet     Balance Overall balance assessment: Needs assistance Sitting-balance support: Feet supported, No upper extremity  supported Sitting balance-Leahy Scale: Good     Standing balance support: Bilateral upper extremity supported, During functional activity, Reliant on assistive device for balance Standing balance-Leahy Scale: Poor                             ADL either performed or assessed with clinical judgement   ADL Overall ADL's : Needs assistance/impaired     Grooming: Supervision/safety;Standing;Wash/dry face;Wash/dry hands;Oral care       Lower Body Bathing: Contact guard assist;Sitting/lateral leans;Sit to/from stand Lower Body Bathing Details (indicate cue type and reason): standing at sink, leaning on sink w/ progressive fatigue     Lower Body Dressing: Sitting/lateral leans;Set up Lower Body Dressing Details (indicate cue type and reason): to don post op shoe with figure four position EOB Toilet Transfer: Contact guard assist;Ambulation;Rolling walker (2 wheels);Regular Toilet   Toileting- Clothing Manipulation and Hygiene: Supervision/safety;Sitting/lateral lean;Sit to/from stand       Functional mobility during ADLs: Contact guard assist;Rolling walker (2 wheels) General ADL Comments: Improving pain and mobility. does endorse fatigue with activity new post op    Extremity/Trunk Assessment Upper Extremity Assessment Upper Extremity Assessment: Overall WFL for tasks assessed;Right hand dominant   Lower Extremity Assessment Lower Extremity Assessment: Defer to PT evaluation        Vision   Vision Assessment?: No apparent visual deficits   Perception     Praxis     Communication Communication Communication: No apparent difficulties   Cognition Arousal: Alert Behavior During Therapy: WFL for tasks assessed/performed Cognition: No apparent impairments  Following commands: Intact        Cueing   Cueing Techniques: Verbal cues  Exercises      Shoulder Instructions       General Comments      Pertinent  Vitals/ Pain       Pain Assessment Pain Assessment: Faces Faces Pain Scale: Hurts a little bit Pain Location: R foot Pain Descriptors / Indicators: Discomfort, Throbbing Pain Intervention(s): Monitored during session  Home Living                                          Prior Functioning/Environment              Frequency  Min 2X/week        Progress Toward Goals  OT Goals(current goals can now be found in the care plan section)  Progress towards OT goals: Progressing toward goals  Acute Rehab OT Goals Patient Stated Goal: go home today OT Goal Formulation: With patient Time For Goal Achievement: 06/27/24 Potential to Achieve Goals: Good ADL Goals Pt Will Perform Upper Body Dressing: Independently;sitting Pt Will Perform Lower Body Dressing: Independently;sitting/lateral leans;sit to/from stand Pt Will Transfer to Toilet: Independently;ambulating;regular height toilet Pt Will Perform Tub/Shower Transfer: Tub transfer;Shower transfer;Independently;shower seat;ambulating;rolling walker  Plan      Co-evaluation                 AM-PAC OT 6 Clicks Daily Activity     Outcome Measure   Help from another person eating meals?: A Little Help from another person taking care of personal grooming?: A Little Help from another person toileting, which includes using toliet, bedpan, or urinal?: A Little Help from another person bathing (including washing, rinsing, drying)?: A Little Help from another person to put on and taking off regular upper body clothing?: A Little Help from another person to put on and taking off regular lower body clothing?: A Little 6 Click Score: 18    End of Session Equipment Utilized During Treatment: Gait belt;Rolling walker (2 wheels)  OT Visit Diagnosis: Unsteadiness on feet (R26.81);Other abnormalities of gait and mobility (R26.89);Muscle weakness (generalized) (M62.81)   Activity Tolerance Patient tolerated  treatment well   Patient Left in bed;with call bell/phone within reach   Nurse Communication          Time: 9180-9154 OT Time Calculation (min): 26 min  Charges: OT General Charges $OT Visit: 1 Visit OT Treatments $Self Care/Home Management : 23-37 mins  Jasmine Richardson, OTR/L Acute Rehab Services Office: [phone]   Jasmine Fish 06/15/2024, 9:40 AM

## 2024-06-15 NOTE — Plan of Care (Signed)
  Problem: Education: Goal: Knowledge of General Education information will improve Description: Including pain rating scale, medication(s)/side effects and non-pharmacologic comfort measures 06/15/2024 1902 by Marthena Lupita RAMAN, RN Outcome: Progressing 06/15/2024 1902 by Marthena Lupita RAMAN, RN Outcome: Progressing   Problem: Health Behavior/Discharge Planning: Goal: Ability to manage health-related needs will improve 06/15/2024 1902 by Marthena Lupita RAMAN, RN Outcome: Progressing 06/15/2024 1902 by Marthena Lupita RAMAN, RN Outcome: Progressing   Problem: Clinical Measurements: Goal: Ability to maintain clinical measurements within normal limits will improve 06/15/2024 1902 by Marthena Lupita RAMAN, RN Outcome: Progressing 06/15/2024 1902 by Marthena Lupita RAMAN, RN Outcome: Progressing Goal: Will remain free from infection 06/15/2024 1902 by Marthena Lupita RAMAN, RN Outcome: Progressing 06/15/2024 1902 by Marthena Lupita RAMAN, RN Outcome: Progressing Goal: Diagnostic test results will improve 06/15/2024 1902 by Marthena Lupita RAMAN, RN Outcome: Progressing 06/15/2024 1902 by Marthena Lupita RAMAN, RN Outcome: Progressing Goal: Respiratory complications will improve 06/15/2024 1902 by Marthena Lupita RAMAN, RN Outcome: Progressing 06/15/2024 1902 by Marthena Lupita RAMAN, RN Outcome: Progressing Goal: Cardiovascular complication will be avoided 06/15/2024 1902 by Marthena Lupita RAMAN, RN Outcome: Progressing 06/15/2024 1902 by Marthena Lupita RAMAN, RN Outcome: Progressing   Problem: Activity: Goal: Risk for activity intolerance will decrease 06/15/2024 1902 by Marthena Lupita RAMAN, RN Outcome: Progressing 06/15/2024 1902 by Marthena Lupita RAMAN, RN Outcome: Progressing   Problem: Clinical Measurements: Goal: Respiratory complications will improve 06/15/2024 1902 by Marthena Lupita RAMAN, RN Outcome: Progressing 06/15/2024 1902 by Marthena Lupita RAMAN, RN Outcome: Progressing

## 2024-06-16 ENCOUNTER — Other Ambulatory Visit: Payer: Self-pay

## 2024-06-16 DIAGNOSIS — L089 Local infection of the skin and subcutaneous tissue, unspecified: Secondary | ICD-10-CM | POA: Diagnosis not present

## 2024-06-16 LAB — RENAL FUNCTION PANEL
Albumin: 2.1 g/dL — ABNORMAL LOW (ref 3.5–5.0)
Anion gap: 12 (ref 5–15)
BUN: 12 mg/dL (ref 8–23)
CO2: 24 mmol/L (ref 22–32)
Calcium: 8.7 mg/dL — ABNORMAL LOW (ref 8.9–10.3)
Chloride: 103 mmol/L (ref 98–111)
Creatinine, Ser: 0.86 mg/dL (ref 0.44–1.00)
GFR, Estimated: >60 mL/min (ref >60)
Glucose, Bld: 134 mg/dL — ABNORMAL HIGH (ref 70–99)
Phosphorus: 3.4 mg/dL (ref 2.5–4.6)
Potassium: 4.7 mmol/L (ref 3.5–5.1)
Sodium: 139 mmol/L (ref 135–145)

## 2024-06-16 LAB — MAGNESIUM: Magnesium: 2 mg/dL (ref 1.7–2.4)

## 2024-06-16 LAB — GLUCOSE, CAPILLARY
Glucose-Capillary: 89 mg/dL (ref 70–99)
Glucose-Capillary: 93 mg/dL (ref 70–99)
Glucose-Capillary: 148 mg/dL — ABNORMAL HIGH (ref 70–99)
Glucose-Capillary: 145 mg/dL — ABNORMAL HIGH (ref 70–99)
Glucose-Capillary: 116 mg/dL — ABNORMAL HIGH (ref 70–99)

## 2024-06-16 LAB — SURGICAL PATHOLOGY

## 2024-06-16 MED ORDER — METRONIDAZOLE 500 MG PO TABS
500.0000 mg | ORAL_TABLET | Freq: Two times a day (BID) | ORAL | Status: DC
  Administered 2024-06-16 – 2024-06-17 (×2): 500 mg via ORAL
  Filled 2024-06-16 (×2): qty 1

## 2024-06-16 MED ORDER — SODIUM CHLORIDE 0.9% FLUSH: 10.0000 mL | INTRAVENOUS | Status: DC | PRN

## 2024-06-16 MED ORDER — FLUCONAZOLE 100 MG PO TABS
100.0000 mg | ORAL_TABLET | Freq: Every day | ORAL | Status: DC
  Administered 2024-06-16 – 2024-06-17 (×2): 100 mg via ORAL
  Filled 2024-06-16 (×2): qty 1

## 2024-06-16 MED ORDER — SODIUM CHLORIDE 0.9 % IV SOLN
2.0000 g | INTRAVENOUS | Status: DC
  Administered 2024-06-16 – 2024-06-17 (×2): 2 g via INTRAVENOUS
  Filled 2024-06-16 (×2): qty 20

## 2024-06-16 NOTE — Progress Notes (Signed)
 Physical Therapy Treatment Patient Details Name: Jasmine Richardson MRN: [id number] DOB: 1961/12/04 Today's Date: 06/16/2024   History of Present Illness Pt is a 63 y/o F presenting to ED on 6/25 with R heel wound from stepping on glass, s/p I & D on 6/27. PMH includes DM2 with neuropathy, CAD s/p CABG, PCI, HTN, HLD, ventricular tachycardia s/p AICD, obesity    PT Comments  Pt endorsing min R heel discomfort, and disgruntled upon PT arrival but agreeable to session. Pt ambulatory for short hallway distance with use of RW and supervision for safety, requires frequent safety cues with RW use but pt does not consistently apply feedback. PT emphasized the importance of wearing post-op shoe when up and walking, and recommended limiting gait to household distances at any given time given R foot injury. Pt expresses understanding.     If plan is discharge home, recommend the following: A little help with walking and/or transfers;A little help with bathing/dressing/bathroom;Assistance with cooking/housework;Help with stairs or ramp for entrance   Can travel by private vehicle        Equipment Recommendations  None recommended by PT    Recommendations for Other Services       Precautions / Restrictions Precautions Precautions: Fall Recall of Precautions/Restrictions: Intact Restrictions Weight Bearing Restrictions Per Provider Order: Yes RLE Weight Bearing Per Provider Order: Weight bearing as tolerated Other Position/Activity Restrictions: WBAT in post-op shoe. Pt states they told me I couldn't put any weight on it, so clarified with Dr. Malvin via secure chat 7/1 who states she can put weight down in post op shoe thanks     Mobility  Bed Mobility               General bed mobility comments: EOB on entry and exit    Transfers Overall transfer level: Needs assistance Equipment used: Rolling walker (2 wheels) Transfers: Sit to/from Stand Sit to Stand: Supervision            General transfer comment: for safety, cues for safe hand placement and maintaining both hands on RW once standing but pt with poor carryover    Ambulation/Gait Ambulation/Gait assistance: Supervision Gait Distance (Feet): 100 Feet Assistive device: Rolling walker (2 wheels) Gait Pattern/deviations: Step-through pattern, Decreased stride length, Antalgic, Trunk flexed Gait velocity: decr     General Gait Details: cues for placement in RW, safety with RW use as pt letting go of RW with at least 1 hand multiple times while up and walking   Stairs             Wheelchair Mobility     Tilt Bed    Modified Rankin (Stroke Patients Only)       Balance Overall balance assessment: Needs assistance Sitting-balance support: Feet supported, No upper extremity supported Sitting balance-Leahy Scale: Good     Standing balance support: Bilateral upper extremity supported, During functional activity, Reliant on assistive device for balance Standing balance-Leahy Scale: Poor                              Communication Communication Communication: No apparent difficulties  Cognition Arousal: Alert Behavior During Therapy: WFL for tasks assessed/performed   PT - Cognitive impairments: No apparent impairments                         Following commands: Intact      Cueing Cueing Techniques: Verbal cues  Exercises  General Comments        Pertinent Vitals/Pain Pain Assessment Pain Assessment: Faces Faces Pain Scale: Hurts a little bit Pain Location: R foot Pain Descriptors / Indicators: Discomfort, Throbbing Pain Intervention(s): Limited activity within patient's tolerance, Monitored during session, Repositioned    Home Living                          Prior Function            PT Goals (current goals can now be found in the care plan section) Acute Rehab PT Goals Patient Stated Goal: go home today PT Goal Formulation: With  patient Time For Goal Achievement: 06/20/24 Potential to Achieve Goals: Good Progress towards PT goals: Progressing toward goals    Frequency    Min 1X/week      PT Plan      Co-evaluation              AM-PAC PT 6 Clicks Mobility   Outcome Measure  Help needed turning from your back to your side while in a flat bed without using bedrails?: A Little Help needed moving from lying on your back to sitting on the side of a flat bed without using bedrails?: A Little Help needed moving to and from a bed to a chair (including a wheelchair)?: A Little Help needed standing up from a chair using your arms (e.g., wheelchair or bedside chair)?: A Little Help needed to walk in hospital room?: A Little Help needed climbing 3-5 steps with a railing? : A Little 6 Click Score: 18    End of Session   Activity Tolerance: Patient tolerated treatment well Patient left: in bed;with call bell/phone within reach;Other (comment) (sitting EOB upon PT arrival to room without alarm, left sitting EOB without alarm) Nurse Communication: Mobility status PT Visit Diagnosis: Unsteadiness on feet (R26.81)     Time: 1151-1210 (x5 minutes not included in chargable time due to pt toileting) PT Time Calculation (min) (ACUTE ONLY): 19 min  Charges:    $Gait Training: 8-22 mins PT General Charges $$ ACUTE PT VISIT: 1 Visit                     Johana RAMAN, PT DPT Acute Rehabilitation Services Secure Chat Preferred  Office [phone]    Nygel Prokop E Stroup 06/16/2024, 1:20 PM

## 2024-06-16 NOTE — Progress Notes (Signed)
  Subjective:  [name], female    DOB: May 15, 1961,  MRN: [id number]  Chief Complaint  Patient presents with   Wound Infection    Pt.,s  Dr. Rozetta from [facility] contacted pt. Today to come to ED for a rt. Heel infection.      DOS: 06/12/24 Procedure: 1. Incision and drainage below fascia level, right heel 2. Bone biopsy heel deep with trocar, right heel  63 y.o. female seen for post op check. Discussed bone biopsy result and she was relieved to hear not definitively osteomyelitis. We discussed plan to continue abx and wound care but is aware if the site worsens or causes readmission will have to consider amputation.   Review of Systems: Negative except as noted in the HPI. Denies N/V/F/Ch.   Objective:   Constitutional Well developed. Well nourished.  Vascular Foot warm and well perfused. Capillary refill normal to all digits.   No calf pain with palpation  Neurologic Normal speech. Oriented to person, place, and time. Epicritic sensation intact to right heel  Dermatologic Dressing C/D/I right foot   Orthopedic: Pain to palpation right heel   Radiographs: None post op  Pathology: A. BONE, RIGHT HEEL, BIOPSY:  - Scant bone and adherent fibroconnective tissue with thermal artifact and mild inflammation   Micro: Right heel soft tissue: Group B strep, Right heel swab GBS  Assessment:   1. Cellulitis of right foot   Abscess of right heel and possible osteomyelitis right heel s/p I&D of abscess and bone biopsy right calcaneus  Plan:  Patient was evaluated and treated and all questions answered.  POD # 4 s/p above procedures R heel I&D - Clinically improving - Calcaneal bone biopsy negative for osteomyelitis. Therefore recommend extended duration IV abx and wound care. - Dressing changes starting tomorrow begin every other day packing dressing change with 1/2 in iodoform packing covered with dry gauze dressing - Will order home health FTF for MWF  dsg changes as above -XR: none taken post op -WB Status: WBAT in post op shoe -Sutures: None present. -Medications/ABX: Abx per ID, appreciate recs. I&D site soft tissue and swab Cx with Group B strep -Dressing: per order beginning tmrw - F/u Plan: patient will follow up with us  next week likely Thursday office to call to arrange. Stable for DC when home health for wound care and iv abx are set up. Will sign off.         [name], DPM [facility] / [facility]

## 2024-06-16 NOTE — Progress Notes (Signed)
 PHARMACY CONSULT NOTE FOR:  OUTPATIENT  PARENTERAL ANTIBIOTIC THERAPY (OPAT)  Indication: osteo Regimen: Ceftriaxone  2 grams iv qday End date: 07/23/2024  IV antibiotic discharge orders are pended. To discharging provider:  please sign these orders via discharge navigator,  Select New Orders & click on the button choice - Manage This Unsigned Work.     Thank you for allowing pharmacy to be a part of this patient's care.  Benedetta Heath BS, PharmD, BCPS Clinical Pharmacist 06/16/2024 5:19 PM  Contact: [phone] after 3 PM  Be curious, not judgmental... -Davina Sprinkles

## 2024-06-16 NOTE — Progress Notes (Signed)
 Peripherally Inserted Central Catheter Placement  The IV Nurse has discussed with the patient and/or persons authorized to consent for the patient, the purpose of this procedure and the potential benefits and risks involved with this procedure.  The benefits include less needle sticks, lab draws from the catheter, and the patient may be discharged home with the catheter. Risks include, but not limited to, infection, bleeding, blood clot (thrombus formation), and puncture of an artery; nerve damage and irregular heartbeat and possibility to perform a PICC exchange if needed/ordered by physician.  Alternatives to this procedure were also discussed.  Bard Power PICC patient education guide, fact sheet on infection prevention and patient information card has been provided to patient /or left at bedside.    PICC Placement Documentation  PICC Single Lumen 06/16/24 Right Brachial 35 cm 0 cm (Active)  Indication for Insertion or Continuance of Line Home intravenous therapies (PICC only) 06/16/24 2101  Exposed Catheter (cm) 0 cm 06/16/24 2101  Site Assessment Clean, Dry, Intact 06/16/24 2101  Line Status Blood return noted;Flushed;Saline locked 06/16/24 2101  Dressing Type Transparent;Securing device 06/16/24 2101  Dressing Status Antimicrobial disc/dressing in place 06/16/24 2101  Line Care Connections checked and tightened 06/16/24 2101  Line Adjustment (NICU/IV Team Only) No 06/16/24 2101  Dressing Intervention New dressing;Adhesive placed at insertion site (IV team only) 06/16/24 2101  Dressing Change Due 06/23/24 06/16/24 2101       Debby Salomon CROME 06/16/2024, 9:24 PM

## 2024-06-16 NOTE — Progress Notes (Addendum)
 Subjective:  Patient and husband are anxious to go with IV antibiotics to treat for possible osteomyelitis   Antibiotics:  Anti-infectives (From admission, onward)    Start     Dose/Rate Route Frequency Ordered Stop   06/16/24 2200  metroNIDAZOLE  (FLAGYL ) tablet 500 mg        500 mg Oral Every 12 hours 06/16/24 1707     06/16/24 1800  cefTRIAXone  (ROCEPHIN ) 2 g in sodium chloride  0.9 % 100 mL IVPB        2 g 200 mL/hr over 30 Minutes Intravenous Every 24 hours 06/16/24 1707     06/16/24 1800  fluconazole  (DIFLUCAN ) tablet 100 mg        100 mg Oral Daily 06/16/24 1708     06/12/24 2359  Ampicillin-Sulbactam (UNASYN) 3 g in sodium chloride  0.9 % 100 mL IVPB  Status:  Discontinued        3 g 200 mL/hr over 30 Minutes Intravenous Every 6 hours 06/12/24 1555 06/16/24 1707   06/11/24 1430  ceFEPIme  (MAXIPIME ) 2 g in sodium chloride  0.9 % 100 mL IVPB  Status:  Discontinued        2 g 200 mL/hr over 30 Minutes Intravenous Every 12 hours 06/11/24 0912 06/12/24 1554   06/11/24 0230  ceFEPIme  (MAXIPIME ) 2 g in sodium chloride  0.9 % 100 mL IVPB  Status:  Discontinued        2 g 200 mL/hr over 30 Minutes Intravenous Every 8 hours 06/10/24 1821 06/11/24 0912   06/10/24 2000  linezolid  (ZYVOX ) IVPB 600 mg  Status:  Discontinued        600 mg 300 mL/hr over 60 Minutes Intravenous Every 12 hours 06/10/24 1756 06/12/24 1554   06/10/24 1830  ceFEPIme  (MAXIPIME ) 2 g in sodium chloride  0.9 % 100 mL IVPB        2 g 200 mL/hr over 30 Minutes Intravenous  Once 06/10/24 1755 06/10/24 1937       Medications: Scheduled Meds:  amiodarone   200 mg Oral Daily   aspirin   81 mg Oral Daily   dapagliflozin  propanediol  10 mg Oral Daily   fluconazole   100 mg Oral Daily   furosemide   20 mg Oral Daily   heparin   5,000 Units Subcutaneous Q8H   insulin  aspart  0-20 Units Subcutaneous TID WC   insulin  aspart  0-5 Units Subcutaneous QHS   insulin  aspart  5 Units Subcutaneous TID WC   insulin   glargine-yfgn  55 Units Subcutaneous Daily   irbesartan   75 mg Oral Daily   metoprolol  succinate  12.5 mg Oral Daily   metroNIDAZOLE   500 mg Oral Q12H   rosuvastatin   20 mg Oral Daily   senna  1 tablet Oral Daily   Continuous Infusions:  cefTRIAXone  (ROCEPHIN )  IV 2 g (06/16/24 1724)   PRN Meds:.acetaminophen , calcium  carbonate, hydrALAZINE , nitroGLYCERIN , oxyCODONE     Objective: Weight change:   Intake/Output Summary (Last 24 hours) at 06/16/2024 1818 Last data filed at 06/15/2024 1900 Gross per 24 hour  Intake 110 ml  Output --  Net 110 ml   Blood pressure 123/79, pulse 85, temperature 98 F (36.7 C), resp. rate 18, height 5' 7 (1.702 m), weight 99.3 kg, SpO2 100%. Temp:  [97.6 F (36.4 C)-98.4 F (36.9 C)] 98 F (36.7 C) (07/01 1414) Pulse Rate:  [67-85] 85 (07/01 1414) Resp:  [16-18] 18 (07/01 1414) BP: (123-159)/(76-90) 123/79 (07/01 1414) SpO2:  [96 %-100 %] 100 % (07/01  1414)  Physical Exam: Physical Exam Constitutional:      General: She is not in acute distress.    Appearance: She is well-developed. She is not diaphoretic.  HENT:     Head: Normocephalic and atraumatic.     Right Ear: External ear normal.     Left Ear: External ear normal.     Mouth/Throat:     Pharynx: No oropharyngeal exudate.   Eyes:     General: No scleral icterus.    Conjunctiva/sclera: Conjunctivae normal.     Pupils: Pupils are equal, round, and reactive to light.    Cardiovascular:     Rate and Rhythm: Normal rate and regular rhythm.  Pulmonary:     Effort: Pulmonary effort is normal. No respiratory distress.     Breath sounds: No wheezing.  Abdominal:     General: Bowel sounds are normal. There is no distension.     Palpations: Abdomen is soft.   Musculoskeletal:        General: No tenderness. Normal range of motion.  Lymphadenopathy:     Cervical: No cervical adenopathy.   Skin:    General: Skin is warm and dry.     Coloration: Skin is not pale.     Findings: No  erythema or rash.   Neurological:     General: No focal deficit present.     Mental Status: She is alert and oriented to person, place, and time.     Motor: No abnormal muscle tone.     Coordination: Coordination normal.   Psychiatric:        Mood and Affect: Mood normal.        Behavior: Behavior normal.        Thought Content: Thought content normal.        Judgment: Judgment normal.      CBC:    BMET Recent Labs    06/15/24 0928 06/16/24 1015  NA 137 139  K 4.5 4.7  CL 101 103  CO2 24 24  GLUCOSE 184* 134*  BUN 14 12  CREATININE 1.02* 0.86  CALCIUM  8.8* 8.7*     Liver Panel  Recent Labs    06/15/24 0928 06/16/24 1015  ALBUMIN 2.2* 2.1*       Sedimentation Rate Recent Labs    06/14/24 1116  ESRSEDRATE 31*   C-Reactive Protein Recent Labs    06/14/24 1116  CRP 15.3*    Micro Results: Recent Results (from the past 720 hours)  WOUND CULTURE     Status: Abnormal   Collection Time: 06/09/24  3:58 PM   Specimen: Foot, Right; Wound   Wound  Result Value Ref Range Status   Gram Stain Result Final report  Final   Organism ID, Bacteria Comment  Final    Comment: No white blood cells seen.   Organism ID, Bacteria Comment  Final    Comment: Moderate number of gram positive cocci.   Aerobic Bacterial Culture Final report (A)  Final   Organism ID, Bacteria Comment (A)  Final    Comment: Beta hemolytic Streptococcus, group B Heavy growth Penicillin and ampicillin are drugs of choice for treatment of beta-hemolytic streptococcal infections. Susceptibility testing of penicillins and other beta-lactam agents approved by the FDA for treatment of beta-hemolytic streptococcal infections need not be performed routinely because nonsusceptible isolates are extremely rare in any beta-hemolytic streptococcus and have not been reported for Streptococcus pyogenes (group A). (CLSI)   Blood culture (routine x 2)  Status: None   Collection Time: 06/10/24   1:11 PM   Specimen: BLOOD  Result Value Ref Range Status   Specimen Description BLOOD RIGHT ANTECUBITAL  Final   Special Requests   Final    BOTTLES DRAWN AEROBIC AND ANAEROBIC Blood Culture results may not be optimal due to an inadequate volume of blood received in culture bottles   Culture   Final    NO GROWTH 5 DAYS Performed at [facility], [address], [place], [place] [address]    Report Status 06/15/2024 FINAL  Final  Blood culture (routine x 2)     Status: None   Collection Time: 06/10/24  6:41 PM   Specimen: BLOOD RIGHT ARM  Result Value Ref Range Status   Specimen Description BLOOD RIGHT ARM  Final   Special Requests   Final    BOTTLES DRAWN AEROBIC AND ANAEROBIC Blood Culture results may not be optimal due to an inadequate volume of blood received in culture bottles   Culture   Final    NO GROWTH 5 DAYS Performed at [facility], [address], [place], [place] [address]    Report Status 06/15/2024 FINAL  Final  Surgical PCR screen     Status: None   Collection Time: 06/10/24 11:59 PM   Specimen: Nasal Mucosa; Nasal Swab  Result Value Ref Range Status   MRSA, PCR NEGATIVE NEGATIVE Final   Staphylococcus aureus NEGATIVE NEGATIVE Final    Comment: (NOTE) The Xpert SA Assay (FDA approved for NASAL specimens in patients 66 years of age and older), is one component of a comprehensive surveillance program. It is not intended to diagnose infection nor to guide or monitor treatment. Performed at [facility], [address], [place], [place] [address]   Aerobic/Anaerobic Culture w Gram Stain (surgical/deep wound)     Status: None (Preliminary result)   Collection Time: 06/12/24  3:34 PM   Specimen: PATH Soft tissue  Result Value Ref Range Status   Specimen Description TISSUE  Final   Special Requests right heel bone  Final   Gram Stain   Final    NO WBC SEEN RARE GRAM POSITIVE COCCI IN PAIRS Performed at [facility], [address]  [place], [place] [address]    Culture   Final    FEW GROUP B STREP(S.AGALACTIAE)ISOLATED TESTING AGAINST S. AGALACTIAE NOT ROUTINELY PERFORMED DUE TO PREDICTABILITY OF AMP/PEN/VAN SUSCEPTIBILITY. NO ANAEROBES ISOLATED; CULTURE IN PROGRESS FOR 5 DAYS    Report Status PENDING  Incomplete  Aerobic/Anaerobic Culture w Gram Stain (surgical/deep wound)     Status: None (Preliminary result)   Collection Time: 06/12/24  3:34 PM   Specimen: PATH Soft tissue  Result Value Ref Range Status   Specimen Description TISSUE  Final   Special Requests right heel swab  Final   Gram Stain   Final    NO WBC SEEN NO ORGANISMS SEEN Performed at [facility], [address] [place], [place] [address]    Culture   Final    FEW GROUP B STREP(S.AGALACTIAE)ISOLATED TESTING AGAINST S. AGALACTIAE NOT ROUTINELY PERFORMED DUE TO PREDICTABILITY OF AMP/PEN/VAN SUSCEPTIBILITY. NO ANAEROBES ISOLATED; CULTURE IN PROGRESS FOR 5 DAYS    Report Status PENDING  Incomplete    Studies/Results: US  EKG SITE RITE Result Date: 06/16/2024 If Site Rite image not attached, placement could not be confirmed due to current cardiac rhythm.     Assessment/Plan:  INTERVAL HISTORY: biopsy not c/w  osteo   Principal Problem:   Right foot infection Active Problems:   S/P CABG (coronary artery bypass graft)   Tobacco use disorder   Ventricular tachycardia   ICD (implantable cardioverter-defibrillator) in place   Obesity (BMI 30-39.9)   Uncontrolled type 2 diabetes mellitus with hyperglycemia (HCC)   Abscess of right foot   Abscess of bursa of right foot   Cellulitis of right foot   Acute osteomyelitis of right calcaneus (HCC)    Jasmine Richardson is a 63 y.o. female with diabetes mellitus peripheral vascular disease per amputations in her left foot who now developed abscess in her right foot has been concern for osteomyelitis MRI did not clearly show this and bone biopsy was considered negative for osteomyelitis.  Group B  streptococcus I agree with culture which is why I expect the doxycycline  and Bactrim  were ineffectual before.  I myself was reminded treated with oral antibiotics but both she and her husband would feel more reassured if she was treated aggressively with intravenous therapy.  I do not think this is unreasonable at request especially given the fact that she has a risk of needing a below the knee amputation.  I did go over the risks and benefits of IV versus oral antibiotics.  I will plan on switching her to ceftriaxone  to complete a 6-week course along with oral Flagyl  500 mg twice daily  Diagnosis: Possible osteomyelitis  Culture Result: Group B streptococcus   Allergies  Allergen Reactions   Vancomycin  Anaphylaxis   Chlorhexidine  Gluconate Itching    Received CHG bath, began itching, required benadryl    Cat Dander Other (See Comments)    Sneezing, watery eyes.   Other Itching    Dial  Soap   Tramadol  Other (See Comments)    Hallucinations   Codeine Rash    OPAT Orders Discharge antibiotics to be given via PICC line Discharge antibiotics: Ceftriaxone  2 grams iv qday + oral flagyl  500 mg BID   Duration: 6 weeks End Date: 07/23/2024   [facility] Care Per Protocol:  Home health RN for IV administration and teaching; PICC line care and labs.    Labs weekly while on IV antibiotics: _x_ CBC with differential _x_ BMP __ CMP _x_ CRP _x_ ESR __ Vancomycin  trough __ CK  _x_ Please pull PIC at completion of IV antibiotics __ Please leave PIC in place until doctor has seen patient or been notified  Fax weekly labs to [phone]  Clinic Follow Up Appt:   Shaquisha Wynn has an appointment on 07/21/2024 at 230PM with Greg Calone  at  [facility], which  is located in the [facility] at  [address] in [place].  [address], which is located to the left of the elevators.  Phone: [phone]  Fax: [phone]  [email or web address]  The patient should arrive 30 minutes prior to their appoitment.  #2 Yeast infection: Start oral fluconazole .  Would recommend she go home with a 10-day prescription of fluconazole  with refills  I have personally spent 62 minutes involved in face-to-face and non-face-to-face activities for this patient on the day of the visit. Professional time spent includes the following activities: Preparing to see the patient (review of tests), Obtaining and/or reviewing separately obtained history (admission/discharge record), Performing a medically appropriate examination and/or evaluation , Ordering medications/tests/procedures, referring and communicating with other health care professionals, Documenting clinical information in the EMR, Independently interpreting results (not separately reported), Communicating results to the patient/family/caregiver,  Counseling and educating the patient/family/caregiver and Care coordination (not separately reported).   Evaluation of the patient requires complex antimicrobial therapy evaluation, counseling , isolation needs to reduce disease transmission and risk assessment and mitigation.   I will look for final cultures which have not grown an anaerobe yet to date.  I will otherwise sign off please call with further questions.   LOS: 6 days   Jomarie Fleeta Rothman 06/16/2024, 6:18 PM

## 2024-06-16 NOTE — Progress Notes (Signed)
 Progress Note   Patient: Jasmine Richardson FMW:[id number] DOB: 1961-08-22 DOA: 06/10/2024     6 DOS: the patient was seen and examined on 06/16/2024   Brief hospital course: 63 year old F with PMH of CAD/CABG in 2017 and PCI in 2021, HFpEF, VT s/p AICD, DM-2, PVD, obesity, HTN, HLD, neuropathy, left TMA amputation and right great toe amputation sent to ED from podiatry office due to right foot abscess.   Patient had a recent right heel wound after she stepped on a piece of glass . She saw podiatry on 6/16 and then 6/24.  Reportedly, small piece of glass was removed.  Continues to have worsening leukocytosis, pain and swelling despite 2 rounds of antibiotics with doxycycline  and then Bactrim .  She had I&D at podiatry office with removal of purulent material and sent to ED for further evaluation.   In ED, vital stable.  Leukocytosis to 22.5 with left shift.  CRP 11.2.  ESR 120.  Lactic acid negative.  Started on cefepime  and Zyvox .  MRI foot ordered.  Podiatry consulted.   MRI right foot limited by motion showed subacute displaced fracture to the displaces of the fifth metatarsal moderate to severe degenerative change to the tarsometatarsal joints and in the 2nd and 3rd MTP joint.     MRI right heel limited by motion raised concern for cellulitis but no abscess, plantar fasciitis with reactive calcaneal change and arthropathy along the Lisfranc joints.  Underwent I&D and bone biopsy by Dr. Pierce on 6/27.  Surgical culture including bone biopsy with strep B.  On IV Unasyn per ID.  ID to formally consult.  Assessment and Plan: R heel abscess  - Appreciate podiatry f/u - IV unasyn 3g q6 hr  - Tylenol  PRN  - Oxycodone  5 mg PO q6 hr PRN  - Senna 8.6 mg PO daily   DM2 - Novolog  SS sq ACHS - Novolog  5 units sq tid  - Semglee  55 units sq daily  - Farxiga  10 mg PO daily   Chronic diastolic CHF  - Lasix  20 mg PO daily   CAD - ASA 81 mg PO daily  - Crestor  20 mg PO daily   VT s/p AICD  -  Amiodarone  200 mg PO daily   HTN - Avapro  75 mg PO daily  - Toprol  XL 12.5 mg PO daily   UTI - Unasyn as above   HLD - Crestor  20 mg PO daily   Obesity class 1  - BMI 34.30 kg/m2  - Complicating care   Subjective: Pt seen and examined at the bedside. Continue with IV antibx. Podiatry consult appreciated. Antibx plan per ID. Physical therapy is seeing the pt.  Physical Exam: Vitals:   06/15/24 2154 06/16/24 0435 06/16/24 0435 06/16/24 0842  BP: (!) 154/90 137/80 137/80 (!) 159/82  Pulse: 74 71 67 76  Resp: 16 18 18 16   Temp: 98.4 F (36.9 C) 98.1 F (36.7 C) 98.1 F (36.7 C) 97.6 F (36.4 C)  TempSrc: Oral   Oral  SpO2: 97% 96% 98% 99%  Weight:      Height:       Physical Exam HENT:     Head: Normocephalic.     Mouth/Throat:     Mouth: Mucous membranes are moist.   Cardiovascular:     Rate and Rhythm: Normal rate.  Pulmonary:     Effort: Pulmonary effort is normal.  Abdominal:     Palpations: Abdomen is soft.   Skin:    General: Skin is  warm.   Neurological:     Mental Status: She is alert. Mental status is at baseline.   Psychiatric:        Mood and Affect: Mood normal.      Disposition: Status is: Inpatient Remains inpatient appropriate because: IV antibx and ID consult plan   Planned Discharge Destination: Barriers to discharge: As above     Time spent: 35 minutes  Author: ATLEE ABERNETHY , MD 06/16/2024 1:31 PM  For on call review [email or web address].

## 2024-06-17 ENCOUNTER — Other Ambulatory Visit (HOSPITAL_BASED_OUTPATIENT_CLINIC_OR_DEPARTMENT_OTHER): Payer: Self-pay

## 2024-06-17 DIAGNOSIS — M86171 Other acute osteomyelitis, right ankle and foot: Secondary | ICD-10-CM

## 2024-06-17 DIAGNOSIS — L03115 Cellulitis of right lower limb: Secondary | ICD-10-CM

## 2024-06-17 DIAGNOSIS — L02611 Cutaneous abscess of right foot: Secondary | ICD-10-CM | POA: Diagnosis not present

## 2024-06-17 DIAGNOSIS — L089 Local infection of the skin and subcutaneous tissue, unspecified: Secondary | ICD-10-CM | POA: Diagnosis not present

## 2024-06-17 DIAGNOSIS — M71071 Abscess of bursa, right ankle and foot: Secondary | ICD-10-CM | POA: Diagnosis not present

## 2024-06-17 LAB — AEROBIC/ANAEROBIC CULTURE W GRAM STAIN (SURGICAL/DEEP WOUND): Gram Stain: NONE SEEN

## 2024-06-17 LAB — COMPREHENSIVE METABOLIC PANEL WITH GFR
ALT: 18 U/L (ref 0–44)
AST: 19 U/L (ref 15–41)
Albumin: 2.4 g/dL — ABNORMAL LOW (ref 3.5–5.0)
Alkaline Phosphatase: 54 U/L (ref 38–126)
Anion gap: 12 (ref 5–15)
BUN: 15 mg/dL (ref 8–23)
CO2: 24 mmol/L (ref 22–32)
Calcium: 9 mg/dL (ref 8.9–10.3)
Chloride: 102 mmol/L (ref 98–111)
Creatinine, Ser: 1.15 mg/dL — ABNORMAL HIGH (ref 0.44–1.00)
GFR, Estimated: 54 mL/min — ABNORMAL LOW (ref >60)
Glucose, Bld: 134 mg/dL — ABNORMAL HIGH (ref 70–99)
Potassium: 3.9 mmol/L (ref 3.5–5.1)
Sodium: 138 mmol/L (ref 135–145)
Total Bilirubin: 0.4 mg/dL (ref 0.0–1.2)
Total Protein: 7.8 g/dL (ref 6.5–8.1)

## 2024-06-17 LAB — GLUCOSE, CAPILLARY
Glucose-Capillary: 129 mg/dL — ABNORMAL HIGH (ref 70–99)
Glucose-Capillary: 112 mg/dL — ABNORMAL HIGH (ref 70–99)
Glucose-Capillary: 111 mg/dL — ABNORMAL HIGH (ref 70–99)
Glucose-Capillary: 140 mg/dL — ABNORMAL HIGH (ref 70–99)

## 2024-06-17 LAB — C-REACTIVE PROTEIN: CRP: 4.9 mg/dL — ABNORMAL HIGH (ref <1.0)

## 2024-06-17 LAB — CBC
HCT: 33.4 % — ABNORMAL LOW (ref 36.0–46.0)
Hemoglobin: 10.6 g/dL — ABNORMAL LOW (ref 12.0–15.0)
MCH: 28.6 pg (ref 26.0–34.0)
MCHC: 31.7 g/dL (ref 30.0–36.0)
MCV: 90 fL (ref 80.0–100.0)
Platelets: 496 10*3/uL — ABNORMAL HIGH (ref 150–400)
RBC: 3.71 MIL/uL — ABNORMAL LOW (ref 3.87–5.11)
RDW: 13.2 % (ref 11.5–15.5)
WBC: 17.3 10*3/uL — ABNORMAL HIGH (ref 4.0–10.5)
nRBC: 0 % (ref 0.0–0.2)

## 2024-06-17 LAB — MAGNESIUM: Magnesium: 2.1 mg/dL (ref 1.7–2.4)

## 2024-06-17 MED ORDER — CEFTRIAXONE IV (FOR PTA / DISCHARGE USE ONLY): 2.0000 g | INTRAVENOUS | 0 refills | Status: AC

## 2024-06-17 MED ORDER — METRONIDAZOLE 500 MG PO TABS
500.0000 mg | ORAL_TABLET | Freq: Two times a day (BID) | ORAL | 0 refills | Status: DC
Start: 2024-06-17 — End: 2024-07-22
  Filled 2024-06-17: qty 72, 36d supply, fill #0

## 2024-06-17 MED ORDER — CLOPIDOGREL BISULFATE 75 MG PO TABS
75.0000 mg | ORAL_TABLET | Freq: Every day | ORAL | Status: DC
  Administered 2024-06-17: 75 mg via ORAL
  Filled 2024-06-17: qty 1

## 2024-06-17 MED ORDER — HUMALOG KWIKPEN 100 UNIT/ML ~~LOC~~ SOPN
1.0000 [IU] | PEN_INJECTOR | Freq: Every day | SUBCUTANEOUS | 2 refills | Status: AC
  Filled 2024-06-17: qty 30, 150d supply, fill #0

## 2024-06-17 MED ORDER — FLUCONAZOLE 200 MG PO TABS
200.0000 mg | ORAL_TABLET | Freq: Every day | ORAL | 2 refills | Status: AC
  Filled 2024-06-17: qty 10, 10d supply, fill #0

## 2024-06-17 NOTE — TOC Transition Note (Signed)
 Transition of Care [facility]) - Discharge Note   Patient Details  Name: Jasmine Richardson MRN: [id number] Date of Birth: [birth year]  Transition of Care [facility]) CM/SW Contact:  Rosalva Jon Bloch, RN Phone Number: 06/17/2024, 2:07 PM   Clinical Narrative:    Patient will DC to: home Anticipated DC date: 06/17/2024 Family notified: yes Transport by: car  Presents with R heel wound from stepping on glass.       - s/p I & D on 6/27   Per MD patient ready for DC today. RN, patient, and  patient's husband aware of DC plan. Pt will d/c with LT IV ABX therapy. Pt agreeable to home health services. Pt without provider  preference. Referral made with Pam[facility]( IV ABX therapy) and [facility] and PT) both accepted for home health services. Pam to provide teaching  ( IV ABX therapy) with pt and husband prior to d/c .  Start of care with home health RN will be Monday ,7/7, MD and PT aware. Pt without RX med concern. Husband to provide transportation to home. Post hospital f/u noted on AVS.  RNCM will sign off for now as intervention is no longer needed. Please consult us  again if new needs arise.    Final next level of care: Home w Home Health Services     Patient Goals and CMS Choice     Choice offered to / list presented to : Patient      Discharge Placement                       Discharge Plan and Services Additional resources added to the After Visit Summary for                  DME Arranged: Other see comment (IV ABX therapy with [facility])   Date DME Agency Contacted: 06/17/24 Time DME Agency Contacted: [id number] Representative spoke with at DME Agency: Pam HH Arranged: PT, RN HH Agency: [facility] Date [facility] Agency Contacted: 06/17/24 Time HH Agency Contacted: [id number] Representative spoke with at [facility] Agency: Burnard  Social Drivers of Health (SDOH) Interventions SDOH Screenings   Food Insecurity: No Food  Insecurity (06/10/2024)  Housing: Low Risk  (06/10/2024)  Transportation Needs: No Transportation Needs (06/10/2024)  Utilities: Not At Risk (06/10/2024)  Alcohol Screen: Low Risk  (10/16/2023)  Depression (PHQ2-9): Low Risk  (03/13/2024)  Financial Resource Strain: Low Risk  (10/16/2023)  Physical Activity: Inactive (10/16/2023)  Social Connections: Socially Integrated (10/16/2023)  Stress: No Stress Concern Present (10/16/2023)  Tobacco Use: Medium Risk (06/12/2024)  Health Literacy: Adequate Health Literacy (10/16/2023)     Readmission Risk Interventions    11/28/2021    2:54 PM  Readmission Risk Prevention Plan  Transportation Screening Complete  PCP or Specialist Appt within 3-5 Days Complete  HRI or Home Care Consult Complete  Social Work Consult for Recovery Care Planning/Counseling Complete  Palliative Care Screening Not Applicable  Medication Review Ocean[employer]) Complete

## 2024-06-17 NOTE — Discharge Summary (Addendum)
 Physician Discharge Summary  Jasmine Richardson FMW:[id number] DOB: [birth year] DOA: 06/10/2024  PCP: Nicholaus Credit, PA-C  Admit date: 06/10/2024 Discharge date: 06/17/24  Admitted From: Home Disposition: Home Recommendations for Outpatient Follow-up:  Outpatient follow-up with PCP as below ID and podiatry to arrange outpatient follow-up Check glycemic control, CMP and CBC at follow-up Please follow up on the following pending results: None  Home Health: HH PT/OT/RN Equipment/Devices: None  Discharge Condition: Stable CODE STATUS: Full code  Follow-up Information     Nicholaus Credit, PA-C Follow up.   Specialty: Physician Assistant Contact information: [address] [place] [address] [phone]                 Hospital course 63 year old F with PMH of CAD/CABG in 2017 and PCI in 2021, HFpEF, VT s/p AICD, DM-2, PVD, obesity, HTN, HLD, neuropathy, left TMA amputation and right great toe amputation sent to ED from podiatry office due to right foot abscess.   Patient had a recent right heel wound after she stepped on a piece of glass . She saw podiatry on 6/16 and then 6/24.  Reportedly, small piece of glass was removed.  Continues to have worsening leukocytosis, pain and swelling despite 2 rounds of antibiotics with doxycycline  and then Bactrim .  She had I&D at podiatry office with removal of purulent material and sent to ED for further evaluation.   In ED, vital stable.  Leukocytosis to 22.5 with left shift.  CRP 11.2.  ESR 120.  Lactic acid negative.  Started on cefepime  and Zyvox .  MRI foot ordered.  Podiatry consulted.   MRI right foot limited by motion showed subacute displaced fracture to the displaces of the fifth metatarsal moderate to severe degenerative change to the tarsometatarsal joints and in the 2nd and 3rd MTP joint.     MRI right heel limited by motion raised concern for cellulitis but no abscess, plantar fasciitis with reactive calcaneal change and  arthropathy along the Lisfranc joints.  Underwent I&D and bone biopsy by Dr. Pierce on 6/27.  Surgical culture including bone biopsy with strep B.  Patient is not interested in surgical intervention.  ID consulted and recommended IV ceftriaxone  and p.o. Flagyl  for 6 weeks with end date of 07/23/2024.  PICC line placed on 7/1.   See individual problem list below for more.   Problems addressed during this hospitalization Right heel abscess/osteomyelitis: Reportedly stepped on a piece of glass does seem to have been removed by podiatry outpatient.  Presents with worsening swelling, pain.  Significant leukocytosis with markedly elevated inflammatory markers.  MRI foot and heel as above.  Leukocytosis remains elevated. -S/p bedside I&D at podiatry office on 6/24.  Abscess culture with group B strep. -S/p I&D and heel bone biopsy by Dr. Pierce on 6/27.  Surgical culture with group B strep. -Per podiatry, WBAT in postop shoe.  Follow further recommendation for osteomyelitis -Zosyn  and cefepime  6/25>> IV Unasyn 6/27 >> CTX and Flagyl  7/1-8/7 per ID. -PICC line placed on 06/16/2024.   IDDM-2 with hyperglycemia: A1c 8.9% (was 9.4% on 02/18/2024)..  On Toujeo  63 units daily, metformin , Ozempic  and Farxiga  at home. -Continue home Toujeo , metformin  and Farxiga  -Advised to hold home Ozempic  while on Flagyl  -Change Humalog  to resistant scale. -Counseled on dietary compliance - Reassess at follow-up   Chronic diastolic CHF: TTE in 02/2023 with LVEF of 60 to 65%.  Appears euvolemic on exam. -Continue with home Lasix , metoprolol , Farxiga  and valsartan    History of CAD  s/p CABG in 2017 and RCA PCI in 05/2020: No cardiopulmonary symptoms. -Continue home statin, aspirin  and Plavix  -Continue with valsartan  and metoprolol    History of VT s/p AICD -Continue with amiodarone    Hypertension: Normotensive for most part. - Continue home regimen   UTI: Antibiotics as above.   Hyperlipidemia: On Repatha   outpatient. - Continue Crestor    Obesity class I Body mass index is 34.3 kg/m.           Consultations: Podiatry Infectious disease  Time spent 35  minutes  Vital signs Vitals:   06/17/24 0507 06/17/24 0757 06/17/24 0758 06/17/24 1314  BP: (!) 149/88 122/78 122/78 (!) 144/76  Pulse: 79 73 73 71  Temp: 98.4 F (36.9 C) 98.2 F (36.8 C) 98.2 F (36.8 C) (!) 97.5 F (36.4 C)  Resp: 16 18 18 18   Height:      Weight:      SpO2: 100% 98% 99% 98%  TempSrc: Oral Oral Oral Oral  BMI (Calculated):         Discharge exam GENERAL: No apparent distress.  Nontoxic. HEENT: MMM.  Vision and hearing grossly intact.  NECK: Supple.  No apparent JVD.  RESP:  No IWOB.  Fair aeration bilaterally. CVS:  RRR. Heart sounds normal.  ABD/GI/GU: BS+. Abd soft, NTND.  MSK/EXT:  S/p previous left TMA and right great toe amputation.  Right foot in Ace wrap  SKIN: no apparent skin lesion or wound NEURO: AA.  Oriented appropriately.  No apparent focal neuro deficit. PSYCH: Calm. Normal affect.     Discharge Instructions Discharge Instructions     [facility] pharmacist to adjust dose for Vancomycin , Aminoglycosides and other anti-infective therapies as requested by physician.   Complete by: As directed    [facility] to provide Cath Flo 2mg    Complete by: As directed    Administer for PICC line occlusion and as ordered by physician for other access device issues.   Anaphylaxis Kit: Provided to treat any anaphylactic reaction to the medication being provided to the patient if First Dose or when requested by physician   Complete by: As directed    Epinephrine  1mg /ml vial / amp: Administer 0.3mg  (0.96ml) subcutaneously once for moderate to severe anaphylaxis, nurse to call physician and pharmacy when reaction occurs and call 911 if needed for immediate care   Diphenhydramine  50mg /ml IV vial: Administer 25-50mg  IV/IM PRN for first dose reaction, rash, itching, mild  reaction, nurse to call physician and pharmacy when reaction occurs   Sodium Chloride  0.9% NS 500ml IV: Administer if needed for hypovolemic blood pressure drop or as ordered by physician after call to physician with anaphylactic reaction   Change dressing on IV access line weekly and PRN   Complete by: As directed    Diet - low sodium heart healthy   Complete by: As directed    Diet Carb Modified   Complete by: As directed    Discharge instructions   Complete by: As directed    It has been a pleasure taking care of you!  You were hospitalized due to right foot infection for which you have been started on antibiotics.  We are discharging him on antibiotics to complete treatment course.  Follow-up with your primary care doctor in 1 to 2 weeks or sooner if needed.  Follow-up with infectious disease and your podiatrist per their recommendation.  It is very important that you have your diabetes/glucose under good control for infection to clear.   Take  care,   Discharge wound care:   Complete by: As directed    Wound care  Every other day    Comments: every other day packing dressing change with 1/2 in iodoform packing packed slightly into both incisions and covered with dry gauze, kerlix roll, ace wrap   Flush IV access with Sodium Chloride  0.9% and Heparin  10 units/ml or 100 units/ml   Complete by: As directed    Home infusion instructions - [facility]   Complete by: As directed    Instructions: Flush IV access with Sodium Chloride  0.9% and Heparin  10units/ml or 100units/ml   Change dressing on IV access line: Weekly and PRN   Instructions Cath Flo 2mg : Administer for PICC Line occlusion and as ordered by physician for other access device   [facility] pharmacist to adjust dose for: Vancomycin , Aminoglycosides and other anti-infective therapies as requested by physician   Increase activity slowly   Complete by: As directed    Method of administration may be changed  at the discretion of home infusion pharmacist based upon assessment of the patient and/or caregiver's ability to self-administer the medication ordered   Complete by: As directed       Allergies as of 06/17/2024       Reactions   Vancomycin  Anaphylaxis   Chlorhexidine  Gluconate Itching   Received CHG bath, began itching, required benadryl    Cat Dander Other (See Comments)   Sneezing, watery eyes.   Other Itching   Dial  Soap   Tramadol  Other (See Comments)   Hallucinations   Codeine Rash        Medication List     STOP taking these medications    mupirocin  ointment 2 % Commonly known as: BACTROBAN    sulfamethoxazole -trimethoprim  800-160 MG tablet Commonly known as: BACTRIM  DS       TAKE these medications    acetaminophen  325 MG tablet Commonly known as: TYLENOL  Take 2 tablets (650 mg total) by mouth every 4 (four) hours as needed for headache or mild pain.   amiodarone  200 MG tablet Commonly known as: PACERONE  Take 1 tablet (200 mg total) by mouth daily.   aspirin  81 MG chewable tablet Chew 81 mg by mouth daily.   cefTRIAXone  IVPB Commonly known as: ROCEPHIN  Inject 2 g into the vein daily. Indication:  osteo First Dose: Yes Last Day of Therapy:  07/23/2024 Labs - Once weekly:  CBC/D and BMP, Labs - Once weekly: ESR and CRP Method of administration: IV Push Method of administration may be changed at the discretion of home infusion pharmacist based upon assessment of the patient and/or caregiver's ability to self-administer the medication ordered.   clopidogrel  75 MG tablet Commonly known as: PLAVIX  Take 1 tablet (75 mg total) by mouth daily.   Dexcom G7 Sensor Misc Change sensor every 10 days as directed   Farxiga  10 MG Tabs tablet Generic drug: dapagliflozin  propanediol Take 1 tablet by mouth once daily   fluconazole  200 MG tablet Commonly known as: DIFLUCAN  Take 1 tablet (200 mg total) by mouth daily.   furosemide  20 MG tablet Commonly known as:  LASIX  Take 1 tablet (20 mg total) by mouth daily. What changed: when to take this   HumaLOG  KwikPen 100 UNIT/ML KwikPen Generic drug: insulin  lispro Inject 1-20 Units into the skin at bedtime. CBG 70 - 120: 0 units CBG 121 - 150: 3 units CBG 151 - 200: 4 units CBG 201 - 250: 7 units CBG 251 - 300: 11 units CBG  301 - 350: 15 units CBG 351 - 400: 20 units CBG > 400: call MD and obtain STAT lab verification What changed:  how much to take additional instructions   Insupen Pen Needles 32G X 4 MM Misc Generic drug: Insulin  Pen Needle Use as directed once daily with insulin .   metFORMIN  500 MG tablet Commonly known as: GLUCOPHAGE  TAKE 1 TABLET BY MOUTH TWICE DAILY WITH A MEAL   metoprolol  succinate 25 MG 24 hr tablet Commonly known as: TOPROL -XL TAKE 1 TABLET BY MOUTH IN THE MORNING, AT NOON AND AT BEDTIME   metroNIDAZOLE  500 MG tablet Commonly known as: FLAGYL  Take 1 tablet (500 mg total) by mouth every 12 (twelve) hours.   nitroGLYCERIN  0.4 MG SL tablet Commonly known as: NITROSTAT  Place 1 tablet (0.4 mg total) under the tongue every 5 (five) minutes as needed for chest pain.   nystatin  powder Commonly known as: MYCOSTATIN /NYSTOP  Apply 1 Application topically daily as needed (rash/yeast).   oxyCODONE  5 MG immediate release tablet Commonly known as: Roxicodone  Take 1 tablet (5 mg total) by mouth every 6 (six) hours as needed for up to 10 doses for severe pain (pain score 7-10).   Ozempic  (0.25 or 0.5 MG/DOSE) 2 MG/3ML Sopn Generic drug: Semaglutide (0.25 or 0.5MG /DOS) Inject 0.25 mg into the skin once weekly for 4 weeks, then increase to 0.5 mg once weekly   Repatha  SureClick 140 MG/ML Soaj Generic drug: Evolocumab  Inject 140 mg into the skin every 14 (fourteen) days.   rosuvastatin  20 MG tablet Commonly known as: CRESTOR  Take 1 tablet (20 mg total) by mouth daily.   senna 8.6 MG tablet Commonly known as: SENOKOT Take 1 tablet (8.6 mg total) by mouth daily for 10 days.  May discontinue once symptoms resolve What changed:  when to take this reasons to take this additional instructions   Toujeo  SoloStar 300 UNIT/ML Solostar Pen Generic drug: insulin  glargine (1 Unit Dial ) Inject 63 Units into the skin daily.   valsartan  80 MG tablet Commonly known as: DIOVAN  Take 1 tablet (80 mg total) by mouth 2 (two) times daily.   Vitamin D  (Ergocalciferol ) 1.25 MG (50000 UNIT) Caps capsule Commonly known as: DRISDOL  TAKE 1 CAPSULE BY MOUTH EVERY SATURDAY               Discharge Care Instructions  (From admission, onward)           Start     Ordered   06/17/24 0000  Change dressing on IV access line weekly and PRN  (Home infusion instructions - [facility] )        06/17/24 0729   06/17/24 0000  Discharge wound care:       Comments: Wound care  Every other day    Comments: every other day packing dressing change with 1/2 in iodoform packing packed slightly into both incisions and covered with dry gauze, kerlix roll, ace wrap   06/17/24 0731             Procedures/Studies: 6/24-I&D at podiatry office 6/27-I&D and bone biopsy by Dr. Lavone   US  EKG SITE RITE Result Date: 06/16/2024 If Site Rite image not attached, placement could not be confirmed due to current cardiac rhythm.  MR HEEL RIGHT W WO CONTRAST Result Date: 06/12/2024 CLINICAL DATA:  Right heel abscess EXAM: MR OF THE RIGHT HEEL WITHOUT AND WITH CONTRAST TECHNIQUE: Multiplanar, multisequence MR imaging of the right ankle was performed both before and after administration of intravenous contrast. CONTRAST:  10mL GADAVIST  GADOBUTROL  1 MMOL/ML IV SOLN COMPARISON:  None Available. FINDINGS: Despite efforts by the technologist and patient, motion artifact is present on today's exam and could not be eliminated. This reduces exam sensitivity and specificity. Bones/Joint/Cartilage Fifth metatarsal fracture discussed on yesterday's foot MRI and better characterized on that exam.  Arthropathy along the Lisfranc joint with subcortical foci of marrow edema especially along the cuneiforms, cuboid, and second through fifth metatarsals. Varus angulation at the Chopart joint. Trace endosteal edema along the inferior calcaneus, likely reactive given the adjacent suspected plantar fasciitis and edema in the plantar calcaneal spur. Ligaments Lateral ligamentous complex and deltoid ligament grossly intact although suboptimally assessed due to motion artifact and difficulties obtaining standard positioning. Muscles and Tendons Distal Achilles tendinopathy and partial tearing. Thickened plantar fascia suggesting plantar fasciitis. Low-level edema in the abductor hallucis muscle, potentially from mild myositis or denervation edema. Soft tissues Abnormal plantar edema along the heel, cellulitis not excluded. No drainable abscess. IMPRESSION: 1. Reduced diagnostic sensitivity and specificity due to patient motion. 2. Abnormal plantar edema along the heel, cellulitis not excluded. No drainable abscess. 3. Thickened plantar fascia suggesting plantar fasciitis. Trace endosteal edema along the inferior calcaneus, likely reactive given the adjacent suspected plantar fasciitis and edema in the plantar calcaneal spur. 4. Distal Achilles tendinopathy and partial tearing. 5. Arthropathy along the Lisfranc joint with subcortical foci of marrow edema especially along the cuneiforms, cuboid, and second through fifth metatarsals. Varus angulation at the Chopart joint. 6. Fifth metatarsal fracture discussed on yesterday's foot MRI and better characterized on that exam. 7. Low-level edema in the abductor hallucis muscle, potentially from mild myositis or denervation edema. Electronically Signed   By: Ryan Salvage M.D.   On: 06/12/2024 12:32   MR FOOT RIGHT W WO CONTRAST Result Date: 06/11/2024 EXAM DESCRIPTION: MR FOOT RIGHT W WO CONTRAST CLINICAL HISTORY: Osteomyelitis suspected, foot, xray done (Ped 0-4y)  COMPARISON: None Available. TECHNIQUE: MRI of the foot is performed according to our usual protocol with multiplanar multi sequence imaging. FINDINGS: The exam is limited by motion artifact. Unremarkable postoperative change to the first digit. Moderate to severe degenerative change to the second MTP joint mild chronic-appearing subluxation. Moderate degenerative change third MTP joint. Moderate degenerative change to the tarsometatarsal joints greater to the fourth and fifth. There is a subacute appearing moderately displaced fracture through the diaphysis of the fifth metatarsal. Mild vein attending. Moderate marrow edema. Moderate to severe atrophy of the plantar musculature. Likely reactive myositis greater medially. Mild dorsal subcutaneous edema. IMPRESSION: Exam limited by motion. Subacute displaced fracture to the diaphysis of the fifth metatarsal. Moderate to severe degenerative change to the second and third MTP joints Moderate degenerative change to the tarsometatarsal joints greatest to the fourth and fifth. Unremarkable postoperative change first digit. Electronically signed by: Reyes Frees MD 06/11/2024 12:29 PM EDT RP Workstation: [id number]   DG Foot Complete Right Result Date: 06/09/2024 Please see detailed radiograph report in office note.  DG Foot Complete Right Result Date: 06/01/2024 Please see detailed radiograph report in office note.  DG Foot Complete Right Result Date: 05/27/2024 Please see detailed radiograph report in office note.      The results of significant diagnostics from this hospitalization (including imaging, microbiology, ancillary and laboratory) are listed below for reference.     Microbiology: Recent Results (from the past 240 hours)  WOUND CULTURE     Status: Abnormal   Collection Time: 06/09/24  3:58 PM   Specimen: Foot, Right; Wound  Wound  Result Value Ref Range Status   Gram Stain Result Final report  Final   Organism ID, Bacteria Comment   Final    Comment: No white blood cells seen.   Organism ID, Bacteria Comment  Final    Comment: Moderate number of gram positive cocci.   Aerobic Bacterial Culture Final report (A)  Final   Organism ID, Bacteria Comment (A)  Final    Comment: Beta hemolytic Streptococcus, group B Heavy growth Penicillin and ampicillin are drugs of choice for treatment of beta-hemolytic streptococcal infections. Susceptibility testing of penicillins and other beta-lactam agents approved by the FDA for treatment of beta-hemolytic streptococcal infections need not be performed routinely because nonsusceptible isolates are extremely rare in any beta-hemolytic streptococcus and have not been reported for Streptococcus pyogenes (group A). (CLSI)   Blood culture (routine x 2)     Status: None   Collection Time: 06/10/24  1:11 PM   Specimen: BLOOD  Result Value Ref Range Status   Specimen Description BLOOD RIGHT ANTECUBITAL  Final   Special Requests   Final    BOTTLES DRAWN AEROBIC AND ANAEROBIC Blood Culture results may not be optimal due to an inadequate volume of blood received in culture bottles   Culture   Final    NO GROWTH 5 DAYS Performed at [facility], [address] [place], [place] [address]    Report Status 06/15/2024 FINAL  Final  Blood culture (routine x 2)     Status: None   Collection Time: 06/10/24  6:41 PM   Specimen: BLOOD RIGHT ARM  Result Value Ref Range Status   Specimen Description BLOOD RIGHT ARM  Final   Special Requests   Final    BOTTLES DRAWN AEROBIC AND ANAEROBIC Blood Culture results may not be optimal due to an inadequate volume of blood received in culture bottles   Culture   Final    NO GROWTH 5 DAYS Performed at [facility], [address], [place], [place] [address]    Report Status 06/15/2024 FINAL  Final  Surgical PCR screen     Status: None   Collection Time: 06/10/24 11:59 PM   Specimen: Nasal Mucosa; Nasal Swab  Result Value Ref Range Status    MRSA, PCR NEGATIVE NEGATIVE Final   Staphylococcus aureus NEGATIVE NEGATIVE Final    Comment: (NOTE) The Xpert SA Assay (FDA approved for NASAL specimens in patients 61 years of age and older), is one component of a comprehensive surveillance program. It is not intended to diagnose infection nor to guide or monitor treatment. Performed at [facility], [address] [place], [place] [address]   Aerobic/Anaerobic Culture w Gram Stain (surgical/deep wound)     Status: None (Preliminary result)   Collection Time: 06/12/24  3:34 PM   Specimen: PATH Soft tissue  Result Value Ref Range Status   Specimen Description TISSUE  Final   Special Requests right heel bone  Final   Gram Stain   Final    NO WBC SEEN RARE GRAM POSITIVE COCCI IN PAIRS Performed at [facility], [address], [place], [place] [address]    Culture   Final    FEW GROUP B STREP(S.AGALACTIAE)ISOLATED TESTING AGAINST S. AGALACTIAE NOT ROUTINELY PERFORMED DUE TO PREDICTABILITY OF AMP/PEN/VAN SUSCEPTIBILITY. NO ANAEROBES ISOLATED; CULTURE IN PROGRESS FOR 5 DAYS    Report Status PENDING  Incomplete  Aerobic/Anaerobic Culture w Gram Stain (surgical/deep wound)     Status: None  Collection Time: 06/12/24  3:34 PM   Specimen: PATH Soft tissue  Result Value Ref Range Status   Specimen Description TISSUE  Final   Special Requests right heel swab  Final   Gram Stain   Final    NO WBC SEEN NO ORGANISMS SEEN Performed at [facility], [address] [place], [place] [address]    Culture   Final    FEW GROUP B STREP(S.AGALACTIAE)ISOLATED TESTING AGAINST S. AGALACTIAE NOT ROUTINELY PERFORMED DUE TO PREDICTABILITY OF AMP/PEN/VAN SUSCEPTIBILITY. FEW FINEGOLDIA MAGNA    Report Status 06/17/2024 FINAL  Final     Labs:  CBC: Recent Labs  Lab 06/12/24 0656 06/13/24 0837 06/14/24 1116 06/15/24 0928 06/17/24 0355  WBC 15.9* 20.1* 19.9* 14.4* 17.3*  NEUTROABS  --   --  17.2*  --   --   HGB 10.6*  10.3* 10.5* 9.7* 10.6*  HCT 33.2* 31.9* 32.6* 30.7* 33.4*  MCV 88.5 89.9 90.1 89.2 90.0  PLT 372 353 404* 360 496*   BMP &GFR Recent Labs  Lab 06/12/24 0656 06/13/24 0837 06/14/24 1116 06/15/24 0928 06/16/24 1015 06/17/24 0355  NA 133* 137 139 137 139 138  K 4.3 4.9 4.3 4.5 4.7 3.9  CL 103 104 105 101 103 102  CO2 23 22 22 24 24 24   GLUCOSE 138* 92 220* 184* 134* 134*  BUN 18 18 18 14 12 15   CREATININE 1.08* 1.14* 1.19* 1.02* 0.86 1.15*  CALCIUM  9.0 9.2 9.4 8.8* 8.7* 9.0  MG 2.0 2.1 2.1 1.8 2.0 2.1  PHOS 3.1 2.8 2.5 2.8 3.4  --    Estimated Creatinine Clearance: 60.6 mL/min (A) (by C-G formula based on SCr of 1.15 mg/dL (H)). Liver & Pancreas: Recent Labs  Lab 06/13/24 9162 06/14/24 1116 06/15/24 0928 06/16/24 1015 06/17/24 0355  AST  --   --   --   --  19  ALT  --   --   --   --  18  ALKPHOS  --   --   --   --  54  BILITOT  --   --   --   --  0.4  PROT  --   --   --   --  7.8  ALBUMIN 2.2* 2.2* 2.2* 2.1* 2.4*   No results for input(s): LIPASE, AMYLASE in the last 168 hours. No results for input(s): AMMONIA in the last 168 hours. Diabetic: No results for input(s): HGBA1C in the last 72 hours. Recent Labs  Lab 06/16/24 1703 06/16/24 2143 06/17/24 0618 06/17/24 0909 06/17/24 1147  GLUCAP 145* 116* 129* 112* 111*   Cardiac Enzymes: No results for input(s): CKTOTAL, CKMB, CKMBINDEX, TROPONINI in the last 168 hours. No results for input(s): PROBNP in the last 8760 hours. Coagulation Profile: No results for input(s): INR, PROTIME in the last 168 hours. Thyroid Function Tests: No results for input(s): TSH, T4TOTAL, FREET4, T3FREE, THYROIDAB in the last 72 hours. Lipid Profile: No results for input(s): CHOL, HDL, LDLCALC, TRIG, CHOLHDL, LDLDIRECT in the last 72 hours. Anemia Panel: No results for input(s): VITAMINB12, FOLATE, FERRITIN, TIBC, IRON, RETICCTPCT in the last 72 hours. Urine analysis:     Component Value Date/Time   COLORURINE AMBER (A) 06/10/2024 1830   APPEARANCEUR CLOUDY (A) 06/10/2024 1830   LABSPEC 1.029 06/10/2024 1830   PHURINE 5.0 06/10/2024 1830   GLUCOSEU >=500 (A) 06/10/2024 1830   HGBUR LARGE (A) 06/10/2024 1830   BILIRUBINUR NEGATIVE 06/10/2024 1830   BILIRUBINUR negative 10/16/2023 1516  KETONESUR NEGATIVE 06/10/2024 1830   PROTEINUR >=300 (A) 06/10/2024 1830   UROBILINOGEN negative (A) 10/16/2023 1516   NITRITE POSITIVE (A) 06/10/2024 1830   LEUKOCYTESUR NEGATIVE 06/10/2024 1830   Sepsis Labs: Invalid input(s): PROCALCITONIN, LACTICIDVEN   SIGNED:  Mayme Profeta T Donika Butner, MD  [facility] 06/17/2024, 4:10 PM

## 2024-06-17 NOTE — Progress Notes (Signed)
 PROGRESS NOTE  Jasmine Richardson FMW:[id number] DOB: 1961/05/22   PCP: Nicholaus Credit, PA-C  Patient is from: Home.  Uses rolling walker and cane at baseline.  DOA: 06/10/2024 LOS: 7  Chief complaints Chief Complaint  Patient presents with   Wound Infection    Pt.,s  Dr. Rozetta from [facility] contacted pt. Today to come to ED for a rt. Heel infection.       Brief Narrative / Interim history: 63 year old F with PMH of CAD/CABG in 2017 and PCI in 2021, HFpEF, VT s/p AICD, DM-2, PVD, obesity, HTN, HLD, neuropathy, left TMA amputation and right great toe amputation sent to ED from podiatry office due to right foot abscess.  Patient had a recent right heel wound after she stepped on a piece of glass . She saw podiatry on 6/16 and then 6/24.  Reportedly, small piece of glass was removed.  Continues to have worsening leukocytosis, pain and swelling despite 2 rounds of antibiotics with doxycycline  and then Bactrim .  She had I&D at podiatry office with removal of purulent material and sent to ED for further evaluation.  In ED, vital stable.  Leukocytosis to 22.5 with left shift.  CRP 11.2.  ESR 120.  Lactic acid negative.  Started on cefepime  and Zyvox .  MRI foot ordered.  Podiatry consulted.  MRI right foot limited by motion showed subacute displaced fracture to the displaces of the fifth metatarsal moderate to severe degenerative change to the tarsometatarsal joints and in the 2nd and 3rd MTP joint.    MRI right heel limited by motion raised concern for cellulitis but no abscess, plantar fasciitis with reactive calcaneal change and arthropathy along the Lisfranc joints.  Underwent I&D and bone biopsy by Dr. Pierce on 6/27.  Surgical culture including bone biopsy with strep B.  Patient is not interested in surgical intervention.  ID consulted and recommended IV ceftriaxone  and p.o. Flagyl  for 6 weeks with end date of 07/23/2024.  PICC line placed on 7/1.  Unfortunately, not able to secure home  health for IV antibiotics until Monday.   Subjective: Seen and examined earlier this morning.  No major events overnight or this morning.  Feels well.  Eager to go home  Objective: Vitals:   06/16/24 2138 06/17/24 0507 06/17/24 0757 06/17/24 0758  BP: (!) 160/72 (!) 149/88 122/78 122/78  Pulse: 79 79 73 73  Resp: 16 16 18 18   Temp: 98.6 F (37 C) 98.4 F (36.9 C) 98.2 F (36.8 C) 98.2 F (36.8 C)  TempSrc: Oral Oral Oral Oral  SpO2: 97% 100% 98% 99%  Weight:      Height:        Examination:  GENERAL: No apparent distress.  Nontoxic. HEENT: MMM.  Vision and hearing grossly intact.  NECK: Supple.  No apparent JVD.  RESP:  No IWOB.  Fair aeration bilaterally. CVS:  RRR. Heart sounds normal.  ABD/GI/GU: BS+. Abd soft, NTND.  MSK/EXT:  S/p previous left TMA and right great toe amputation.  Right foot in Ace wrap  SKIN: no apparent skin lesion or wound NEURO: AA.  Oriented appropriately.  No apparent focal neuro deficit. PSYCH: Calm. Normal affect.   Consultants:  Podiatry Infectious disease  Procedures: 6/24-I&D at podiatry office 6/27-I&D and bone biopsy by Dr. Lavone  Microbiology summarized: 6/24-outpatient abscess culture with group B strep's 6/25-blood cultures NGTD 6/25-MRSA PCR screen nonreactive 6/27-right heel bone biopsy with strep B.  Assessment and plan: Right heel abscess/osteomyelitis: Reportedly stepped on a piece of  glass does seem to have been removed by podiatry outpatient.  Presents with worsening swelling, pain.  Significant leukocytosis with markedly elevated inflammatory markers.  MRI foot and heel as above.  Leukocytosis remains elevated. -S/p bedside I&D at podiatry office on 6/24.  Abscess culture with group B strep. -S/p I&D and heel bone biopsy by Dr. Pierce on 6/27.  Surgical culture with group B strep. -Per podiatry, WBAT in postop shoe.  Follow further recommendation for osteomyelitis -Zosyn  and cefepime  6/25>> IV Unasyn 6/27 >> CTX  and Flagyl  7/1-8/7 per ID. -PICC line placed on 06/16/2024. -Pain and glycemic control -PT/OT eval  IDDM-2 with hyperglycemia: A1c 8.9% (was 9.4% on 02/18/2024)..  On Toujeo  63 units daily, metformin , Ozempic  and Farxiga  at home. Recent Labs  Lab 06/16/24 1143 06/16/24 1703 06/16/24 2143 06/17/24 0618 06/17/24 0909  GLUCAP 148* 145* 116* 129* 112*  -Continue home Farxiga  -Continue Semglee  55 units daily -Continue SSI to resistant scale -Continue NovoLog  5 units 3 times daily with meals   Chronic diastolic CHF: TTE in 02/2023 with LVEF of 60 to 65%.  Appears euvolemic on exam. -Continue with home Lasix , metoprolol , Farxiga  and valsartan  -Monitor fluid status   History of CAD s/p CABG in 2017 and RCA PCI in 05/2020: No cardiopulmonary symptoms. -Continue with statin and aspirin . -Resume home Plavix . -Continue with valsartan  and metoprolol    History of VT s/p AICD -Continue with amiodarone   Hypertension: Normotensive for most part. - Continue home regimen - Hydralazine  as needed   UTI: Antibiotics as above.  Hyperlipidemia: On Repatha  outpatient. - Continue Crestor   Obesity class I Body mass index is 34.3 kg/m. -Continue home Ozempic  outpatient.           DVT prophylaxis:  heparin  injection 5,000 Units Start: 06/10/24 2200  Code Status: Full code Family Communication: None at bedside Level of care: Med-Surg Status is: Inpatient Remains inpatient appropriate because: Right foot infection   Final disposition: Home once [facility] RN secured.   35 minutes with more than 50% spent in reviewing records, counseling patient/family and coordinating care.   Sch Meds:  Scheduled Meds:  amiodarone   200 mg Oral Daily   aspirin   81 mg Oral Daily   dapagliflozin  propanediol  10 mg Oral Daily   fluconazole   100 mg Oral Daily   furosemide   20 mg Oral Daily   heparin   5,000 Units Subcutaneous Q8H   insulin  aspart  0-20 Units Subcutaneous TID WC   insulin  aspart  0-5 Units  Subcutaneous QHS   insulin  aspart  5 Units Subcutaneous TID WC   insulin  glargine-yfgn  55 Units Subcutaneous Daily   irbesartan   75 mg Oral Daily   metoprolol  succinate  12.5 mg Oral Daily   metroNIDAZOLE   500 mg Oral Q12H   rosuvastatin   20 mg Oral Daily   senna  1 tablet Oral Daily   Continuous Infusions:  cefTRIAXone  (ROCEPHIN )  IV 2 g (06/16/24 1724)   PRN Meds:.acetaminophen , calcium  carbonate, hydrALAZINE , nitroGLYCERIN , oxyCODONE , sodium chloride  flush  Antimicrobials: Anti-infectives (From admission, onward)    Start     Dose/Rate Route Frequency Ordered Stop   06/17/24 0000  cefTRIAXone  (ROCEPHIN ) IVPB        2 g Intravenous Every 24 hours 06/17/24 0729 07/24/24 2359   06/17/24 0000  metroNIDAZOLE  (FLAGYL ) 500 MG tablet        500 mg Oral Every 12 hours 06/17/24 0729 07/23/24 2359   06/17/24 0000  fluconazole  (DIFLUCAN ) 200 MG tablet  200 mg Oral Daily 06/17/24 0750 07/17/24 2359   06/16/24 2200  metroNIDAZOLE  (FLAGYL ) tablet 500 mg        500 mg Oral Every 12 hours 06/16/24 1707     06/16/24 1800  cefTRIAXone  (ROCEPHIN ) 2 g in sodium chloride  0.9 % 100 mL IVPB        2 g 200 mL/hr over 30 Minutes Intravenous Every 24 hours 06/16/24 1707     06/16/24 1800  fluconazole  (DIFLUCAN ) tablet 100 mg        100 mg Oral Daily 06/16/24 1708     06/12/24 2359  Ampicillin-Sulbactam (UNASYN) 3 g in sodium chloride  0.9 % 100 mL IVPB  Status:  Discontinued        3 g 200 mL/hr over 30 Minutes Intravenous Every 6 hours 06/12/24 1555 06/16/24 1707   06/11/24 1430  ceFEPIme  (MAXIPIME ) 2 g in sodium chloride  0.9 % 100 mL IVPB  Status:  Discontinued        2 g 200 mL/hr over 30 Minutes Intravenous Every 12 hours 06/11/24 0912 06/12/24 1554   06/11/24 0230  ceFEPIme  (MAXIPIME ) 2 g in sodium chloride  0.9 % 100 mL IVPB  Status:  Discontinued        2 g 200 mL/hr over 30 Minutes Intravenous Every 8 hours 06/10/24 1821 06/11/24 0912   06/10/24 2000  linezolid  (ZYVOX ) IVPB 600 mg   Status:  Discontinued        600 mg 300 mL/hr over 60 Minutes Intravenous Every 12 hours 06/10/24 1756 06/12/24 1554   06/10/24 1830  ceFEPIme  (MAXIPIME ) 2 g in sodium chloride  0.9 % 100 mL IVPB        2 g 200 mL/hr over 30 Minutes Intravenous  Once 06/10/24 1755 06/10/24 1937        I have personally reviewed the following labs and images: CBC: Recent Labs  Lab 06/10/24 1311 06/11/24 0631 06/12/24 0656 06/13/24 0837 06/14/24 1116 06/15/24 0928 06/17/24 0355  WBC 22.5*   < > 15.9* 20.1* 19.9* 14.4* 17.3*  NEUTROABS 19.3*  --   --   --  17.2*  --   --   HGB 11.9*   < > 10.6* 10.3* 10.5* 9.7* 10.6*  HCT 36.6   < > 33.2* 31.9* 32.6* 30.7* 33.4*  MCV 89.1   < > 88.5 89.9 90.1 89.2 90.0  PLT 433*   < > 372 353 404* 360 496*   < > = values in this interval not displayed.   BMP &GFR Recent Labs  Lab 06/12/24 0656 06/13/24 0837 06/14/24 1116 06/15/24 0928 06/16/24 1015 06/17/24 0355  NA 133* 137 139 137 139 138  K 4.3 4.9 4.3 4.5 4.7 3.9  CL 103 104 105 101 103 102  CO2 23 22 22 24 24 24   GLUCOSE 138* 92 220* 184* 134* 134*  BUN 18 18 18 14 12 15   CREATININE 1.08* 1.14* 1.19* 1.02* 0.86 1.15*  CALCIUM  9.0 9.2 9.4 8.8* 8.7* 9.0  MG 2.0 2.1 2.1 1.8 2.0 2.1  PHOS 3.1 2.8 2.5 2.8 3.4  --    Estimated Creatinine Clearance: 60.6 mL/min (A) (by C-G formula based on SCr of 1.15 mg/dL (H)). Liver & Pancreas: Recent Labs  Lab 06/10/24 1311 06/12/24 0656 06/13/24 0837 06/14/24 1116 06/15/24 0928 06/16/24 1015 06/17/24 0355  AST 11*  --   --   --   --   --  19  ALT 11  --   --   --   --   --  18  ALKPHOS 55  --   --   --   --   --  54  BILITOT 0.5  --   --   --   --   --  0.4  PROT 8.0  --   --   --   --   --  7.8  ALBUMIN 2.7*   < > 2.2* 2.2* 2.2* 2.1* 2.4*   < > = values in this interval not displayed.   No results for input(s): LIPASE, AMYLASE in the last 168 hours. No results for input(s): AMMONIA in the last 168 hours. Diabetic: No results for input(s):  HGBA1C in the last 72 hours.  Recent Labs  Lab 06/16/24 1143 06/16/24 1703 06/16/24 2143 06/17/24 0618 06/17/24 0909  GLUCAP 148* 145* 116* 129* 112*   Cardiac Enzymes: No results for input(s): CKTOTAL, CKMB, CKMBINDEX, TROPONINI in the last 168 hours. No results for input(s): PROBNP in the last 8760 hours. Coagulation Profile: No results for input(s): INR, PROTIME in the last 168 hours. Thyroid Function Tests: No results for input(s): TSH, T4TOTAL, FREET4, T3FREE, THYROIDAB in the last 72 hours. Lipid Profile: No results for input(s): CHOL, HDL, LDLCALC, TRIG, CHOLHDL, LDLDIRECT in the last 72 hours. Anemia Panel: No results for input(s): VITAMINB12, FOLATE, FERRITIN, TIBC, IRON, RETICCTPCT in the last 72 hours. Urine analysis:    Component Value Date/Time   COLORURINE AMBER (A) 06/10/2024 1830   APPEARANCEUR CLOUDY (A) 06/10/2024 1830   LABSPEC 1.029 06/10/2024 1830   PHURINE 5.0 06/10/2024 1830   GLUCOSEU >=500 (A) 06/10/2024 1830   HGBUR LARGE (A) 06/10/2024 1830   BILIRUBINUR NEGATIVE 06/10/2024 1830   BILIRUBINUR negative 10/16/2023 1516   KETONESUR NEGATIVE 06/10/2024 1830   PROTEINUR >=300 (A) 06/10/2024 1830   UROBILINOGEN negative (A) 10/16/2023 1516   NITRITE POSITIVE (A) 06/10/2024 1830   LEUKOCYTESUR NEGATIVE 06/10/2024 1830   Sepsis Labs: Invalid input(s): PROCALCITONIN, LACTICIDVEN  Microbiology: Recent Results (from the past 240 hours)  WOUND CULTURE     Status: Abnormal   Collection Time: 06/09/24  3:58 PM   Specimen: Foot, Right; Wound   Wound  Result Value Ref Range Status   Gram Stain Result Final report  Final   Organism ID, Bacteria Comment  Final    Comment: No white blood cells seen.   Organism ID, Bacteria Comment  Final    Comment: Moderate number of gram positive cocci.   Aerobic Bacterial Culture Final report (A)  Final   Organism ID, Bacteria Comment (A)  Final    Comment: Beta  hemolytic Streptococcus, group B Heavy growth Penicillin and ampicillin are drugs of choice for treatment of beta-hemolytic streptococcal infections. Susceptibility testing of penicillins and other beta-lactam agents approved by the FDA for treatment of beta-hemolytic streptococcal infections need not be performed routinely because nonsusceptible isolates are extremely rare in any beta-hemolytic streptococcus and have not been reported for Streptococcus pyogenes (group A). (CLSI)   Blood culture (routine x 2)     Status: None   Collection Time: 06/10/24  1:11 PM   Specimen: BLOOD  Result Value Ref Range Status   Specimen Description BLOOD RIGHT ANTECUBITAL  Final   Special Requests   Final    BOTTLES DRAWN AEROBIC AND ANAEROBIC Blood Culture results may not be optimal due to an inadequate volume of blood received in culture bottles   Culture   Final    NO GROWTH 5 DAYS Performed at [facility], [address], [place],  [place] [address]    Report Status 06/15/2024 FINAL  Final  Blood culture (routine x 2)     Status: None   Collection Time: 06/10/24  6:41 PM   Specimen: BLOOD RIGHT ARM  Result Value Ref Range Status   Specimen Description BLOOD RIGHT ARM  Final   Special Requests   Final    BOTTLES DRAWN AEROBIC AND ANAEROBIC Blood Culture results may not be optimal due to an inadequate volume of blood received in culture bottles   Culture   Final    NO GROWTH 5 DAYS Performed at [facility], [address], [place], [place] [address]    Report Status 06/15/2024 FINAL  Final  Surgical PCR screen     Status: None   Collection Time: 06/10/24 11:59 PM   Specimen: Nasal Mucosa; Nasal Swab  Result Value Ref Range Status   MRSA, PCR NEGATIVE NEGATIVE Final   Staphylococcus aureus NEGATIVE NEGATIVE Final    Comment: (NOTE) The Xpert SA Assay (FDA approved for NASAL specimens in patients 11 years of age and older), is one component of a comprehensive surveillance  program. It is not intended to diagnose infection nor to guide or monitor treatment. Performed at [facility], [address] [place], [place] [address]   Aerobic/Anaerobic Culture w Gram Stain (surgical/deep wound)     Status: None (Preliminary result)   Collection Time: 06/12/24  3:34 PM   Specimen: PATH Soft tissue  Result Value Ref Range Status   Specimen Description TISSUE  Final   Special Requests right heel bone  Final   Gram Stain   Final    NO WBC SEEN RARE GRAM POSITIVE COCCI IN PAIRS Performed at [facility], [address], [place], [place] [address]    Culture   Final    FEW GROUP B STREP(S.AGALACTIAE)ISOLATED TESTING AGAINST S. AGALACTIAE NOT ROUTINELY PERFORMED DUE TO PREDICTABILITY OF AMP/PEN/VAN SUSCEPTIBILITY. NO ANAEROBES ISOLATED; CULTURE IN PROGRESS FOR 5 DAYS    Report Status PENDING  Incomplete  Aerobic/Anaerobic Culture w Gram Stain (surgical/deep wound)     Status: None (Preliminary result)   Collection Time: 06/12/24  3:34 PM   Specimen: PATH Soft tissue  Result Value Ref Range Status   Specimen Description TISSUE  Final   Special Requests right heel swab  Final   Gram Stain   Final    NO WBC SEEN NO ORGANISMS SEEN Performed at [facility], [address], [place], [place] [address]    Culture   Final    FEW GROUP B STREP(S.AGALACTIAE)ISOLATED TESTING AGAINST S. AGALACTIAE NOT ROUTINELY PERFORMED DUE TO PREDICTABILITY OF AMP/PEN/VAN SUSCEPTIBILITY. NO ANAEROBES ISOLATED; CULTURE IN PROGRESS FOR 5 DAYS    Report Status PENDING  Incomplete    Radiology Studies: US  EKG SITE RITE Result Date: 06/16/2024 If Site Rite image not attached, placement could not be confirmed due to current cardiac rhythm.      Kaylani Fromme T. Jannelle Notaro [facility]  If 7PM-7AM, please contact night-coverage [email or web address] 06/17/2024, 11:29 AM

## 2024-06-17 NOTE — Progress Notes (Signed)
 Occupational Therapy Treatment Patient Details Name: Jasmine Richardson MRN: [id number] DOB: Oct 23, 1961 Today's Date: 06/17/2024   History of present illness Pt is a 63 y/o F presenting to ED on 6/25 with R heel wound from stepping on glass, s/p I & D on 6/27. PMH includes DM2 with neuropathy, CAD s/p CABG, PCI, HTN, HLD, ventricular tachycardia s/p AICD, obesity   OT comments  Pt is making great progress towards their acute OT goals. On arrival pt was fully dressed and stated that she did not need assistance, she demonstrated good ability to don her post op shoe. Pt is adamant that she is not supposed to put much weight on her RLE despite clearance and education from MD. Pt was mod I for bed mobility and superivsion A for transfers and short mobility with a RW. Educated pt on compensatory techniques for bathing due to PICC and R foot wound, she verbalized understanding. OT to continue to follow acutely to facilitate progress towards established goals. Pt will continue to benefit from [facility] - of note, pt will be home alone from 6a-3p when her husband is working.       If plan is discharge home, recommend the following:  A little help with walking and/or transfers;A little help with bathing/dressing/bathroom;Assistance with cooking/housework;Direct supervision/assist for medications management;Direct supervision/assist for financial management;Assist for transportation;Help with stairs or ramp for entrance   Equipment Recommendations  None recommended by OT       Precautions / Restrictions Precautions Precautions: Fall Recall of Precautions/Restrictions: Intact Restrictions Weight Bearing Restrictions Per Provider Order: Yes RLE Weight Bearing Per Provider Order: Weight bearing as tolerated Other Position/Activity Restrictions: WBAT in post-op shoe. Pt states they told me I couldn't put any weight on it, so clarified with Dr. Malvin via secure chat 7/1 who states she can put weight down in  post op shoe thanks       Mobility Bed Mobility Overal bed mobility: Modified Independent                  Transfers Overall transfer level: Needs assistance Equipment used: Rolling walker (2 wheels) Transfers: Sit to/from Stand Sit to Stand: Supervision                 Balance Overall balance assessment: Needs assistance Sitting-balance support: Feet supported, No upper extremity supported Sitting balance-Leahy Scale: Good     Standing balance support: Bilateral upper extremity supported, During functional activity, Reliant on assistive device for balance Standing balance-Leahy Scale: Poor             ADL either performed or assessed with clinical judgement   ADL Overall ADL's : Needs assistance/impaired         Lower Body Dressing: Contact guard assist;Cueing for safety Lower Body Dressing Details (indicate cue type and reason): including mgmt of post op shoe Toilet Transfer: Contact guard assist;Rolling walker (2 wheels)           Functional mobility during ADLs: Contact guard assist;Rolling walker (2 wheels) General ADL Comments: no physical assist required. INcreased time spent on education for safety with ADLs. Pt will have to sponge bathe at the sink until PICC line is removed. SH everbalized understanding.    Extremity/Trunk Assessment Upper Extremity Assessment Upper Extremity Assessment: Overall WFL for tasks assessed   Lower Extremity Assessment Lower Extremity Assessment: Defer to PT evaluation        Vision   Vision Assessment?: No apparent visual deficits   Perception Perception Perception: Not tested  Praxis Praxis Praxis: Not tested   Communication Communication Communication: No apparent difficulties   Cognition Arousal: Alert Behavior During Therapy: WFL for tasks assessed/performed Cognition: No apparent impairments             OT - Cognition Comments: Overall WFL, limited insight. Pt adamant that she is  not supposed put weight through her RLE despite MD clearance                 Following commands: Intact        Cueing   Cueing Techniques: Verbal cues        General Comments VSS on RA    Pertinent Vitals/ Pain       Pain Assessment Pain Assessment: Faces Faces Pain Scale: Hurts a little bit Pain Location: R foot Pain Descriptors / Indicators: Discomfort, Throbbing Pain Intervention(s): Limited activity within patient's tolerance, Monitored during session   Frequency  Min 2X/week        Progress Toward Goals  OT Goals(current goals can now be found in the care plan section)  Progress towards OT goals: Progressing toward goals  Acute Rehab OT Goals Patient Stated Goal: home today OT Goal Formulation: With patient Time For Goal Achievement: 06/27/24 Potential to Achieve Goals: Good ADL Goals Pt Will Perform Upper Body Dressing: Independently;sitting Pt Will Perform Lower Body Dressing: Independently;sitting/lateral leans;sit to/from stand Pt Will Transfer to Toilet: Independently;ambulating;regular height toilet Pt Will Perform Tub/Shower Transfer: Tub transfer;Shower transfer;Independently;shower seat;ambulating;rolling walker   AM-PAC OT 6 Clicks Daily Activity     Outcome Measure   Help from another person eating meals?: A Little Help from another person taking care of personal grooming?: A Little Help from another person toileting, which includes using toliet, bedpan, or urinal?: A Little Help from another person bathing (including washing, rinsing, drying)?: A Little Help from another person to put on and taking off regular upper body clothing?: A Little Help from another person to put on and taking off regular lower body clothing?: A Little 6 Click Score: 18    End of Session Equipment Utilized During Treatment: Gait belt;Rolling walker (2 wheels)  OT Visit Diagnosis: Unsteadiness on feet (R26.81);Other abnormalities of gait and mobility  (R26.89);Muscle weakness (generalized) (M62.81)   Activity Tolerance Patient tolerated treatment well   Patient Left in bed;with call bell/phone within reach   Nurse Communication Mobility status        Time: 1035-1050 OT Time Calculation (min): 15 min  Charges: OT General Charges $OT Visit: 1 Visit OT Treatments $Self Care/Home Management : 8-22 mins  Lucie Kendall, OTR/L Acute Rehabilitation Services Office [phone] Secure Chat Communication Preferred   Lucie JONETTA Kendall 06/17/2024, 11:30 AM

## 2024-06-18 ENCOUNTER — Other Ambulatory Visit (HOSPITAL_BASED_OUTPATIENT_CLINIC_OR_DEPARTMENT_OTHER): Payer: Self-pay

## 2024-06-18 ENCOUNTER — Telehealth: Payer: Self-pay | Admitting: *Deleted

## 2024-06-18 DIAGNOSIS — L03115 Cellulitis of right lower limb: Secondary | ICD-10-CM | POA: Diagnosis not present

## 2024-06-18 DIAGNOSIS — M86171 Other acute osteomyelitis, right ankle and foot: Secondary | ICD-10-CM | POA: Diagnosis not present

## 2024-06-18 LAB — AEROBIC/ANAEROBIC CULTURE W GRAM STAIN (SURGICAL/DEEP WOUND): Gram Stain: NONE SEEN

## 2024-06-18 NOTE — Transitions of Care (Post Inpatient/ED Visit) (Signed)
 06/18/2024  Name: Jasmine Richardson MRN: [id number] DOB: [birth year]  Today's TOC FU Call Status: Today's TOC FU Call Status:: Successful TOC FU Call Completed TOC FU Call Complete Date: 06/18/24 Patient's Name and Date of Birth confirmed.  Transition Care Management Follow-up Telephone Call Date of Discharge: 06/17/24 Discharge Facility: [facility]) Type of Discharge: Inpatient Admission Primary Inpatient Discharge Diagnosis:: Right foot infection How have you been since you were released from the hospital?: Better Any questions or concerns?: No  Items Reviewed: Did you receive and understand the discharge instructions provided?: Yes Medications obtained,verified, and reconciled?: No Medications Not Reviewed Reasons:: Other: (Patient declined medication review with RNCM) Any new allergies since your discharge?: No Dietary orders reviewed?: Yes Type of Diet Ordered:: low sodium, heart healthy, carb modified Do you have support at home?: Yes People in Home [RPT]: spouse Name of Support/Comfort Primary Source: Spouse/Terry  Medications Reviewed Today:Patient declined Medication review today Medications Reviewed Today     Reviewed by Lucky Andrea LABOR, RN (Registered Nurse) on 06/18/24 at 1232  Med List Status: <None>   Medication Order Taking? Sig Documenting Provider Last Dose Status Informant  acetaminophen  (TYLENOL ) 325 MG tablet [id number] No Take 2 tablets (650 mg total) by mouth every 4 (four) hours as needed for headache or mild pain. Samtani, Jai-Gurmukh, MD 06/10/2024 Morning Active Self  amiodarone  (PACERONE ) 200 MG tablet [id number] No Take 1 tablet (200 mg total) by mouth daily. Monetta Redell PARAS, MD 06/09/2024 Morning Active Self  aspirin  81 MG chewable tablet [id number] No Chew 81 mg by mouth daily. [provider] 06/09/2024 Morning Active Self  cefTRIAXone  (ROCEPHIN ) IVPB [id number]  Inject 2 g into the vein daily. Indication:  osteo First Dose: Yes Last Day of  Therapy:  07/23/2024 Labs - Once weekly:  CBC/D and BMP, Labs - Once weekly: ESR and CRP Method of administration: IV Push Method of administration may be changed at the discretion of home infusion pharmacist based upon assessment of the patient and/or caregiver's ability to self-administer the medication ordered. Gonfa, Taye T, MD  Active   clopidogrel  (PLAVIX ) 75 MG tablet [id number] No Take 1 tablet (75 mg total) by mouth daily. MadireddyAlean SAUNDERS, MD 06/09/2024 Morning Active Self  Continuous Glucose Sensor (DEXCOM G7 SENSOR) MISC [id number]  Change sensor every 10 days as directed Nicholaus Credit, PA-C  Active Self  Evolocumab  (REPATHA  SURECLICK) 140 MG/ML SOAJ [id number] No Inject 140 mg into the skin every 14 (fourteen) days. Nicholaus Credit, PA-C Past Week Active Self           Med Note (SATTERFIELD, DARIUS E   Wed Jun 10, 2024  6:06 PM) Patient states she is still taking this medication. Unknown actual last date taken per patient   FARXIGA  10 MG TABS tablet [id number] No Take 1 tablet by mouth once daily Nicholaus Credit, PA-C 06/09/2024 Morning Active Self  fluconazole  (DIFLUCAN ) 200 MG tablet [id number]  Take 1 tablet (200 mg total) by mouth daily. Fleeta Rothman, Jomarie SAILOR, MD  Active   furosemide  (LASIX ) 20 MG tablet [id number] No Take 1 tablet (20 mg total) by mouth daily.  Patient taking differently: Take 20 mg by mouth every other day.   Monetta Redell PARAS, MD 06/09/2024 Morning Active Self  HUMALOG  KWIKPEN 100 UNIT/ML KwikPen [id number]  Inject 1-20 Units into the skin at bedtime. CBG 70 - 120: 0 units CBG 121 - 150: 3 units CBG 151 - 200: 4 units CBG 201 - 250: 7 units CBG 251 - 300:  11 units CBG 301 - 350: 15 units CBG 351 - 400: 20 units CBG > 400: call MD and obtain STAT lab verification Gonfa, Taye T, MD  Active   insulin  glargine, 1 Unit Dial , (TOUJEO  SOLOSTAR) 300 UNIT/ML Solostar Pen [id number] No Inject 63 Units into the skin daily. Sherre Clapper, MD 06/09/2024 Morning Active Self  Insulin  Pen Needle  (INSUPEN PEN NEEDLES) 32G X 4 MM MISC [id number]  Use as directed once daily with insulin . Sherre Clapper, MD  Active Self  metFORMIN  (GLUCOPHAGE ) 500 MG tablet [id number] No TAKE 1 TABLET BY MOUTH TWICE DAILY WITH A MEAL Nicholaus Credit, PA-C 06/09/2024 Evening Active Self           Med Note (SATTERFIELD, DARIUS E   Wed Jun 10, 2024  6:16 PM) Patient verified she is taking   metoprolol  succinate (TOPROL -XL) 25 MG 24 hr tablet [id number] No TAKE 1 TABLET BY MOUTH IN THE MORNING, AT NOON AND AT BEDTIME Monetta Redell PARAS, MD 06/09/2024 Bedtime Active Self  metroNIDAZOLE  (FLAGYL ) 500 MG tablet [id number]  Take 1 tablet (500 mg total) by mouth every 12 (twelve) hours. Gonfa, Taye T, MD  Active   nitroGLYCERIN  (NITROSTAT ) 0.4 MG SL tablet [id number] No Place 1 tablet (0.4 mg total) under the tongue every 5 (five) minutes as needed for chest pain. Nicholaus Credit, PA-C Unknown Active Self  nystatin  (MYCOSTATIN /NYSTOP ) powder [id number] No Apply 1 Application topically daily as needed (rash/yeast). Nicholaus Credit, PA-C Past Week Active Self           Med Note (SATTERFIELD, TEENA BRAVO   Wed Jun 10, 2024  6:08 PM) Patient verified she still uses as needed   oxyCODONE  (ROXICODONE ) 5 MG immediate release tablet [id number] No Take 1 tablet (5 mg total) by mouth every 6 (six) hours as needed for up to 10 doses for severe pain (pain score 7-10). Lamount Ethan CROME, DPM Past Week Active Self  rosuvastatin  (CRESTOR ) 20 MG tablet [id number] No Take 1 tablet (20 mg total) by mouth daily. Monetta Redell PARAS, MD 06/09/2024 Morning Active Self           Med Note (SATTERFIELD, DARIUS E   Wed Jun 10, 2024  6:08 PM) Patient verified she is still taking   Semaglutide ,0.25 or 0.5MG /DOS, (OZEMPIC , 0.25 OR 0.5 MG/DOSE,) 2 MG/3ML SOPN [id number] No Inject 0.25 mg into the skin once weekly for 4 weeks, then increase to 0.5 mg once weekly Sherre Clapper, MD 06/07/2024 Active Self           Med Note (SATTERFIELD, DARIUS E   Wed Jun 10, 2024  5:49 PM) Take on Sundays   senna (SENOKOT) 8.6 MG tablet [id number] No Take 1 tablet (8.6 mg total) by mouth daily for 10 days. May discontinue once symptoms resolve  Patient taking differently: Take 1 tablet by mouth daily as needed for constipation.   Lamount Ethan CROME, DPM Past Week Active Self  valsartan  (DIOVAN ) 80 MG tablet [id number] No Take 1 tablet (80 mg total) by mouth 2 (two) times daily. Monetta Redell PARAS, MD 06/09/2024 Morning Active Self           Med Note (SATTERFIELD, DARIUS E   Wed Jun 10, 2024  6:09 PM) Patient verified she is still taking   Vitamin D , Ergocalciferol , (DRISDOL ) 1.25 MG (50000 UNIT) CAPS capsule [id number] No TAKE 1 CAPSULE BY MOUTH EVERY SATURDAY Nicholaus Credit, PA-C 06/06/2024 Active Self            Home Care and  Equipment/Supplies: Were Home Health Services Ordered?: Yes Name of Home Health Agency:: [facility] first visit on 06/20/24 and [facility] on 7/725 Has Agency set up a time to come to your home?: Yes First Home Health Visit Date: 06/20/24 Any new equipment or medical supplies ordered?: No  Functional Questionnaire: Do you need assistance with bathing/showering or dressing?: Yes Do you need assistance with meal preparation?: Yes Do you need assistance with eating?: No Do you have difficulty maintaining continence: No Do you need assistance with getting out of bed/getting out of a chair/moving?: Yes Do you have difficulty managing or taking your medications?: No  Follow up appointments reviewed: PCP Follow-up appointment confirmed?: Yes Date of PCP follow-up appointment?: 07/07/24 Follow-up Provider: Camie Moats Specialist [facility] Follow-up appointment confirmed?: Yes Date of Specialist follow-up appointment?: 07/21/24 Follow-Up Specialty Provider:: [facility] with Cordella July Do you need transportation to your follow-up appointment?: No Do you understand care options if your condition(s) worsen?: Yes-patient verbalized understanding  SDOH Interventions Today    Flowsheet Row  Most Recent Value  SDOH Interventions   Food Insecurity Interventions Intervention Not Indicated  Housing Interventions Intervention Not Indicated  Transportation Interventions Intervention Not Indicated  Utilities Interventions Intervention Not Indicated    Andrea Dimes RN, BSN [place]  [facility] RN Care Manager [phone]

## 2024-06-19 DIAGNOSIS — L03115 Cellulitis of right lower limb: Secondary | ICD-10-CM | POA: Diagnosis not present

## 2024-06-19 DIAGNOSIS — M86171 Other acute osteomyelitis, right ankle and foot: Secondary | ICD-10-CM | POA: Diagnosis not present

## 2024-06-20 DIAGNOSIS — L03115 Cellulitis of right lower limb: Secondary | ICD-10-CM | POA: Diagnosis not present

## 2024-06-20 DIAGNOSIS — M86171 Other acute osteomyelitis, right ankle and foot: Secondary | ICD-10-CM | POA: Diagnosis not present

## 2024-06-21 DIAGNOSIS — L03115 Cellulitis of right lower limb: Secondary | ICD-10-CM | POA: Diagnosis not present

## 2024-06-21 DIAGNOSIS — M86171 Other acute osteomyelitis, right ankle and foot: Secondary | ICD-10-CM | POA: Diagnosis not present

## 2024-06-22 DIAGNOSIS — I10 Essential (primary) hypertension: Secondary | ICD-10-CM | POA: Diagnosis not present

## 2024-06-22 DIAGNOSIS — L03115 Cellulitis of right lower limb: Secondary | ICD-10-CM | POA: Diagnosis not present

## 2024-06-22 DIAGNOSIS — M86171 Other acute osteomyelitis, right ankle and foot: Secondary | ICD-10-CM | POA: Diagnosis not present

## 2024-06-23 ENCOUNTER — Other Ambulatory Visit: Payer: Self-pay

## 2024-06-23 ENCOUNTER — Encounter (HOSPITAL_COMMUNITY): Payer: Self-pay | Admitting: Emergency Medicine

## 2024-06-23 ENCOUNTER — Emergency Department (HOSPITAL_COMMUNITY)

## 2024-06-23 ENCOUNTER — Emergency Department (HOSPITAL_COMMUNITY)
Admission: EM | Admit: 2024-06-23 | Discharge: 2024-06-24 | Disposition: A | Discharge status: Home / Self Care | Attending: Emergency Medicine | Admitting: Emergency Medicine

## 2024-06-23 ENCOUNTER — Telehealth: Payer: Self-pay

## 2024-06-23 DIAGNOSIS — T82898A Other specified complication of vascular prosthetic devices, implants and grafts, initial encounter: Secondary | ICD-10-CM | POA: Diagnosis not present

## 2024-06-23 DIAGNOSIS — M868X7 Other osteomyelitis, ankle and foot: Secondary | ICD-10-CM | POA: Insufficient documentation

## 2024-06-23 DIAGNOSIS — T82594A Other mechanical complication of infusion catheter, initial encounter: Secondary | ICD-10-CM | POA: Diagnosis not present

## 2024-06-23 DIAGNOSIS — R918 Other nonspecific abnormal finding of lung field: Secondary | ICD-10-CM | POA: Diagnosis not present

## 2024-06-23 DIAGNOSIS — Z452 Encounter for adjustment and management of vascular access device: Secondary | ICD-10-CM | POA: Diagnosis not present

## 2024-06-23 DIAGNOSIS — L03115 Cellulitis of right lower limb: Secondary | ICD-10-CM | POA: Diagnosis not present

## 2024-06-23 DIAGNOSIS — M86171 Other acute osteomyelitis, right ankle and foot: Secondary | ICD-10-CM | POA: Diagnosis not present

## 2024-06-23 DIAGNOSIS — I517 Cardiomegaly: Secondary | ICD-10-CM | POA: Diagnosis not present

## 2024-06-23 DIAGNOSIS — Y828 Other medical devices associated with adverse incidents: Secondary | ICD-10-CM | POA: Diagnosis not present

## 2024-06-23 DIAGNOSIS — I10 Essential (primary) hypertension: Secondary | ICD-10-CM | POA: Diagnosis not present

## 2024-06-23 LAB — LAB REPORT - SCANNED

## 2024-06-23 NOTE — ED Triage Notes (Signed)
 Patient here w/ issues with her PICC line to R arm. Patient with PICC line for abx treatment for foot infection. Patient states that they've had issues pushing medicines through since she's had it on 7/4, saying its always been slow to go through but tonight during med administration the husband couldn't administer the meds through the PICC so they called home health nurse who tried as well unsuccessfully and advised going to the ER.

## 2024-06-23 NOTE — Telephone Encounter (Signed)
 Copied from CRM [id number]. Topic: Clinical - Home Health Verbal Orders >> Jun 23, 2024 11:04 AM Antwanette L wrote: Caller/Agency: Angela from [facility] Callback Number: [phone] Service Requested: IV, wound care for the right heal, and antibiotics Frequency: Once a week, Jon will visit the patient do do lab work, dressing her wound, and picc lines Any new concerns about the patient? No

## 2024-06-23 NOTE — Progress Notes (Addendum)
 Previous documentation states exposed cath at 0 mark. Current exposed cath is 4 cm. Pt will need chest xray to confirm tip placement. Triage RN notified.

## 2024-06-23 NOTE — ED Provider Triage Note (Signed)
 Emergency Medicine Provider Triage Evaluation Note  Jasmine Richardson , a 63 y.o. female  was evaluated in triage.  Pt complains of vascular access problem. Reports that she has a PICC line in her left arm and is getting antibiotics for a foot infection and has had difficulty getting the antibiotics to flow. Patients family member at the bedside reports that he has had to push them through with significant force to get the medicine in. She has not missed any doses of antibiotics. They called the home health nurse who attempted to manipulate it but was unsuccessful and recommended she come in for evaluation. She denies any pain to the area. She reports she still has a month of antibiotics left.   Review of Systems  Positive:  Negative:   Physical Exam  BP 128/83   Pulse 97   Temp 98.5 F (36.9 C)   Resp 20   LMP  (LMP Unknown)   SpO2 100%  Gen:   Awake, no distress   Resp:  Normal effort  MSK:   Moves extremities without difficulty  Other:  PICC line with overlying dressing in place to LUE. No swelling or tenderness. Radial pulses 2+  Medical Decision Making  Medically screening exam initiated at 9:33 PM.  Appropriate orders placed.  Jasmine Richardson was informed that the remainder of the evaluation will be completed by another provider, this initial triage assessment does not replace that evaluation, and the importance of remaining in the ED until their evaluation is complete.     Jasmine Richardson Jasmine Richardson 06/23/24 2138

## 2024-06-24 ENCOUNTER — Encounter: Admitting: Podiatry

## 2024-06-24 ENCOUNTER — Other Ambulatory Visit: Payer: Self-pay

## 2024-06-24 DIAGNOSIS — T82594A Other mechanical complication of infusion catheter, initial encounter: Secondary | ICD-10-CM | POA: Diagnosis not present

## 2024-06-24 DIAGNOSIS — L03115 Cellulitis of right lower limb: Secondary | ICD-10-CM | POA: Diagnosis not present

## 2024-06-24 DIAGNOSIS — M86171 Other acute osteomyelitis, right ankle and foot: Secondary | ICD-10-CM | POA: Diagnosis not present

## 2024-06-24 MED ORDER — SODIUM CHLORIDE 0.9% FLUSH: 10.0000 mL | INTRAVENOUS | Status: DC | PRN

## 2024-06-24 MED ORDER — SODIUM CHLORIDE 0.9% FLUSH: 10.0000 mL | Freq: Two times a day (BID) | INTRAVENOUS | Status: DC

## 2024-06-24 NOTE — ED Provider Notes (Signed)
  [facility] EMERGENCY DEPARTMENT AT [facility] Provider Note   CSN: [id number] Arrival date & time: 06/23/24  2114     Patient presents with: picc line clotted and Vascular Access Problem   Jasmine Richardson is a 63 y.o. female.   The history is provided by the patient and medical records.   63 year old female with osteomyelitis of right foot, currently on IV Flagyl  via PICC line, presenting to the ED with issues related to her PICC line.  States she has been home from the hospital for about a week now, has home health coming to do her IV antibiotics.  Never having a lot of trouble getting her IV Flagyl  in yesterday but were ultimately able to administer antibiotics.  Afterwards line was not able to be flushed and home health nurse was meeting a lot of resistance.  They tried several modalities but unable to get it to flush.  Patient has not had any bleeding.  She denies any pain related to her PICC line.  Denies any fever or chills.  Prior to Admission medications   Medication Sig Start Date End Date Taking? Authorizing Provider  acetaminophen  (TYLENOL ) 325 MG tablet Take 2 tablets (650 mg total) by mouth every 4 (four) hours as needed for headache or mild pain. 10/14/21   Samtani, Jai-Gurmukh, MD  amiodarone  (PACERONE ) 200 MG tablet Take 1 tablet (200 mg total) by mouth daily. 05/05/24   Monetta Redell PARAS, MD  aspirin  81 MG chewable tablet Chew 81 mg by mouth daily.    [provider]  cefTRIAXone  (ROCEPHIN ) IVPB Inject 2 g into the vein daily. Indication:  osteo First Dose: Yes Last Day of Therapy:  07/23/2024 Labs - Once weekly:  CBC/D and BMP, Labs - Once weekly: ESR and CRP Method of administration: IV Push Method of administration may be changed at the discretion of home infusion pharmacist based upon assessment of the patient and/or caregiver's ability to self-administer the medication ordered. 06/17/24 07/24/24  Gonfa, Taye T, MD  clopidogrel  (PLAVIX ) 75 MG tablet Take 1  tablet (75 mg total) by mouth daily. 05/26/24   Madireddy, Alean SAUNDERS, MD  Continuous Glucose Sensor (DEXCOM G7 SENSOR) MISC Change sensor every 10 days as directed 04/29/24   Nicholaus Credit, PA-C  Evolocumab  (REPATHA  SURECLICK) 140 MG/ML SOAJ Inject 140 mg into the skin every 14 (fourteen) days. 03/13/24   Nicholaus Credit, PA-C  FARXIGA  10 MG TABS tablet Take 1 tablet by mouth once daily 04/06/24   Nicholaus Credit, PA-C  fluconazole  (DIFLUCAN ) 200 MG tablet Take 1 tablet (200 mg total) by mouth daily. 06/17/24 07/17/24  Fleeta Kathie Jomarie LOISE, MD  furosemide  (LASIX ) 20 MG tablet Take 1 tablet (20 mg total) by mouth daily. Patient taking differently: Take 20 mg by mouth every other day. 08/06/22   Monetta Redell PARAS, MD  HUMALOG  KWIKPEN 100 UNIT/ML KwikPen Inject 1-20 Units into the skin at bedtime. CBG 70 - 120: 0 units CBG 121 - 150: 3 units CBG 151 - 200: 4 units CBG 201 - 250: 7 units CBG 251 - 300: 11 units CBG 301 - 350: 15 units CBG 351 - 400: 20 units CBG > 400: call MD and obtain STAT lab verification 06/17/24   Gonfa, Taye T, MD  insulin  glargine, 1 Unit Dial , (TOUJEO  SOLOSTAR) 300 UNIT/ML Solostar Pen Inject 63 Units into the skin daily. 05/14/24   Cox, Abigail, MD  Insulin  Pen Needle (INSUPEN PEN NEEDLES) 32G X 4 MM MISC Use as directed once  daily with insulin . 05/15/24   Cox, Kirsten, MD  metFORMIN  (GLUCOPHAGE ) 500 MG tablet TAKE 1 TABLET BY MOUTH TWICE DAILY WITH A MEAL 01/30/24   Nicholaus Credit, PA-C  metoprolol  succinate (TOPROL -XL) 25 MG 24 hr tablet TAKE 1 TABLET BY MOUTH IN THE MORNING, AT NOON AND AT BEDTIME 05/06/24   Monetta Redell PARAS, MD  metroNIDAZOLE  (FLAGYL ) 500 MG tablet Take 1 tablet (500 mg total) by mouth every 12 (twelve) hours. 06/17/24 07/24/24  Gonfa, Taye T, MD  nitroGLYCERIN  (NITROSTAT ) 0.4 MG SL tablet Place 1 tablet (0.4 mg total) under the tongue every 5 (five) minutes as needed for chest pain. 09/20/22   Nicholaus Credit, PA-C  nystatin  (MYCOSTATIN /NYSTOP ) powder Apply 1 Application topically daily as needed  (rash/yeast). 10/16/23   Nicholaus Credit, PA-C  oxyCODONE  (ROXICODONE ) 5 MG immediate release tablet Take 1 tablet (5 mg total) by mouth every 6 (six) hours as needed for up to 10 doses for severe pain (pain score 7-10). 06/01/24   Lamount Ethan CROME, DPM  rosuvastatin  (CRESTOR ) 20 MG tablet Take 1 tablet (20 mg total) by mouth daily. 04/29/23   Monetta Redell PARAS, MD  Semaglutide ,0.25 or 0.5MG /DOS, (OZEMPIC , 0.25 OR 0.5 MG/DOSE,) 2 MG/3ML SOPN Inject 0.25 mg into the skin once weekly for 4 weeks, then increase to 0.5 mg once weekly 05/14/24 06/25/24  CoxAbigail, MD  valsartan  (DIOVAN ) 80 MG tablet Take 1 tablet (80 mg total) by mouth 2 (two) times daily. 04/29/23   Monetta Redell PARAS, MD  Vitamin D , Ergocalciferol , (DRISDOL ) 1.25 MG (50000 UNIT) CAPS capsule TAKE 1 CAPSULE BY MOUTH EVERY SATURDAY 03/30/24   Nicholaus Credit, PA-C    Allergies: Vancomycin , Chlorhexidine  gluconate, Ativan  [lorazepam ], Cat dander, Other, Tramadol , and Codeine    Review of Systems  Constitutional:        PICC line issue  All other systems reviewed and are negative.   Updated Vital Signs BP 119/76   Pulse 81   Temp 97.9 F (36.6 C) (Oral)   Resp 17   LMP  (LMP Unknown)   SpO2 100%   Physical Exam Vitals and nursing note reviewed.  Constitutional:      Appearance: She is well-developed.  HENT:     Head: Normocephalic and atraumatic.  Eyes:     Conjunctiva/sclera: Conjunctivae normal.     Pupils: Pupils are equal, round, and reactive to light.  Cardiovascular:     Rate and Rhythm: Normal rate and regular rhythm.     Heart sounds: Normal heart sounds.  Pulmonary:     Effort: Pulmonary effort is normal.     Breath sounds: Normal breath sounds.  Musculoskeletal:        General: Normal range of motion.     Cervical back: Normal range of motion.     Comments: PICC line RUE, appears clean without redness or drainage, dressing is dry/intact, unable to flush line-- lots of resistance, no pain around site, no swelling or  erythema present, no streaking  Skin:    General: Skin is warm and dry.  Neurological:     Mental Status: She is alert and oriented to person, place, and time.     (all labs ordered are listed, but only abnormal results are displayed) Labs Reviewed - No data to display  EKG: None  Radiology: DG Chest Portable 1 View Result Date: 06/23/2024 CLINICAL DATA:  PICC line EXAM: PORTABLE CHEST 1 VIEW COMPARISON:  12/12/2021 FINDINGS: Left-sided pacing device as before. Right upper extremity PICC line tip  appears to be within the right axillary region. Stable cardiomediastinal silhouette. Atelectasis or scar in the left lower lung. No pleural effusion or pneumothorax IMPRESSION: Right upper extremity PICC line tip appears to be within the right axillary region. Cardiomegaly with mild atelectasis or scar in the left lower lung Electronically Signed   By: Luke Bun M.D.   On: 06/23/2024 22:37     Procedures   Medications Ordered in the ED - No data to display                                  Medical Decision Making Amount and/or Complexity of Data Reviewed Radiology: ordered and independent interpretation performed.   64 y.o. F here with issues related to her PICC line.  Present in RUE without signs of infection, however line will not flush when attempted by home health RN.  I have tried to flush as well, meeting lots of resistance.  No pain per patient.  She is HD stable.    IV team has assessed-- will need exchange.  Someone should be available for this after 7am.  Care will be signed out to oncoming provider.  Final diagnoses:  Occlusion of peripherally inserted central catheter (PICC) line, initial encounter [facility])    ED Discharge Orders     None          Jarold Olam HERO, PA-C 06/24/24 [id number]    Lorette Mayo, MD 06/24/24 2329

## 2024-06-24 NOTE — ED Notes (Signed)
 PICC team at bedside

## 2024-06-24 NOTE — Progress Notes (Signed)
 Peripherally Inserted Central Catheter Placement  The IV Nurse has discussed with the patient and/or persons authorized to consent for the patient, the purpose of this procedure and the potential benefits and risks involved with this procedure.  The benefits include less needle sticks, lab draws from the catheter, and the patient may be discharged home with the catheter. Risks include, but not limited to, infection, bleeding, blood clot (thrombus formation), and puncture of an artery; nerve damage and irregular heartbeat and possibility to perform a PICC exchange if needed/ordered by physician.  Alternatives to this procedure were also discussed.  Bard Power PICC patient education guide, fact sheet on infection prevention and patient information card has been provided to patient /or left at bedside.    PICC Placement Documentation  PICC Single Lumen 06/24/24 Right Brachial 35 cm 0 cm (Active)  Indication for Insertion or Continuance of Line Home intravenous therapies (PICC only) 06/23/24 2214  Exposed Catheter (cm) 0 cm 06/23/24 2214  Site Assessment Clean, Dry, Intact 06/23/24 2214  Line Status Flushed;Saline locked;Blood return noted 06/23/24 2214  Dressing Type Transparent;Securing device 06/23/24 2214  Dressing Status Antimicrobial disc/dressing in place;Clean, Dry, Intact 06/23/24 2214  Line Care Connections checked and tightened 06/23/24 2214  Line Adjustment (NICU/IV Team Only) No 06/23/24 2214  Dressing Intervention New dressing;Adhesive placed at insertion site (IV team only) 06/23/24 2214  Dressing Change Due 07/01/24 06/23/24 2214       Ethyl Priestly [facility] 06/24/2024, 9:21 AM

## 2024-06-25 ENCOUNTER — Telehealth: Payer: Self-pay

## 2024-06-25 ENCOUNTER — Inpatient Hospital Stay

## 2024-06-25 DIAGNOSIS — L03115 Cellulitis of right lower limb: Secondary | ICD-10-CM | POA: Diagnosis not present

## 2024-06-25 DIAGNOSIS — M86171 Other acute osteomyelitis, right ankle and foot: Secondary | ICD-10-CM | POA: Diagnosis not present

## 2024-06-25 NOTE — Telephone Encounter (Signed)
 Per Jasmine Richardson: someone needs to see her foot, needs an appointment with the office then done the surgery.

## 2024-06-25 NOTE — Telephone Encounter (Signed)
 Left message for patient to return call.

## 2024-06-25 NOTE — Telephone Encounter (Signed)
 The surgeon should be the one to make that referral and pt does need to make follow up appt with surgeon as well to take a look at foot -- infection needs to be treated if needed in addition to wound care

## 2024-06-25 NOTE — Telephone Encounter (Signed)
Spoke with patient, verbalized understanding and had no questions at this time.  

## 2024-06-25 NOTE — Telephone Encounter (Signed)
 Spoke with patient, she wants to know can she get a referral for [place] at [facility] to check and take care of her foot. She states [place] just got done with her other foot about a month ago. Patient states she had surgery on her right foot about 2 weeks ago. She is having a little (yellow) drainage from the wound, and scab on wound is white and a little warm to the touch. No redness.   Patient's husband changes bandage every other day.   Copied from CRM [id number]. Topic: Referral - Question >> Jun 25, 2024  1:43 PM Tobias CROME wrote: Reason for CRM: Patient requesting to speak to [facility] nurse about referral for her foot that she just had surgery on.   Patient requesting a call back, [phone]

## 2024-06-26 DIAGNOSIS — L03115 Cellulitis of right lower limb: Secondary | ICD-10-CM | POA: Diagnosis not present

## 2024-06-26 DIAGNOSIS — M86171 Other acute osteomyelitis, right ankle and foot: Secondary | ICD-10-CM | POA: Diagnosis not present

## 2024-06-27 DIAGNOSIS — M86171 Other acute osteomyelitis, right ankle and foot: Secondary | ICD-10-CM | POA: Diagnosis not present

## 2024-06-27 DIAGNOSIS — L03115 Cellulitis of right lower limb: Secondary | ICD-10-CM | POA: Diagnosis not present

## 2024-06-28 DIAGNOSIS — L03115 Cellulitis of right lower limb: Secondary | ICD-10-CM | POA: Diagnosis not present

## 2024-06-28 DIAGNOSIS — M86171 Other acute osteomyelitis, right ankle and foot: Secondary | ICD-10-CM | POA: Diagnosis not present

## 2024-06-29 ENCOUNTER — Other Ambulatory Visit (HOSPITAL_BASED_OUTPATIENT_CLINIC_OR_DEPARTMENT_OTHER): Payer: Self-pay

## 2024-06-29 ENCOUNTER — Telehealth: Payer: Self-pay | Admitting: Podiatry

## 2024-06-29 ENCOUNTER — Encounter: Admitting: Podiatry

## 2024-06-29 DIAGNOSIS — M86171 Other acute osteomyelitis, right ankle and foot: Secondary | ICD-10-CM | POA: Diagnosis not present

## 2024-06-29 DIAGNOSIS — L03115 Cellulitis of right lower limb: Secondary | ICD-10-CM | POA: Diagnosis not present

## 2024-06-29 NOTE — Telephone Encounter (Signed)
 Pt called checking on an appt. I looked and she was scheduled to see Dr Lamount today but stated she did not know about it and did not have transportation to get to the office this week.I told pt I scheduled it on Friday 7/11 and she said she tried to call Friday about it and our office was closed.  Her family( transportation) was at vacation bible school for the week. She asked to see Dr Janit next week and is aware it would be in [place] office. She needs a late afternoon appt as she cannot get here in the mornings.

## 2024-06-30 ENCOUNTER — Ambulatory Visit (INDEPENDENT_AMBULATORY_CARE_PROVIDER_SITE_OTHER): Admitting: Podiatry

## 2024-06-30 ENCOUNTER — Ambulatory Visit (INDEPENDENT_AMBULATORY_CARE_PROVIDER_SITE_OTHER)

## 2024-06-30 ENCOUNTER — Telehealth: Payer: Self-pay

## 2024-06-30 ENCOUNTER — Encounter: Payer: Self-pay | Admitting: Podiatry

## 2024-06-30 DIAGNOSIS — L03115 Cellulitis of right lower limb: Secondary | ICD-10-CM

## 2024-06-30 DIAGNOSIS — S91341D Puncture wound with foreign body, right foot, subsequent encounter: Secondary | ICD-10-CM | POA: Diagnosis not present

## 2024-06-30 DIAGNOSIS — L02619 Cutaneous abscess of unspecified foot: Secondary | ICD-10-CM

## 2024-06-30 DIAGNOSIS — I472 Ventricular tachycardia, unspecified: Secondary | ICD-10-CM | POA: Diagnosis not present

## 2024-06-30 DIAGNOSIS — F419 Anxiety disorder, unspecified: Secondary | ICD-10-CM | POA: Diagnosis not present

## 2024-06-30 DIAGNOSIS — I2510 Atherosclerotic heart disease of native coronary artery without angina pectoris: Secondary | ICD-10-CM | POA: Diagnosis not present

## 2024-06-30 DIAGNOSIS — E1169 Type 2 diabetes mellitus with other specified complication: Secondary | ICD-10-CM | POA: Diagnosis not present

## 2024-06-30 DIAGNOSIS — B951 Streptococcus, group B, as the cause of diseases classified elsewhere: Secondary | ICD-10-CM | POA: Diagnosis not present

## 2024-06-30 DIAGNOSIS — E538 Deficiency of other specified B group vitamins: Secondary | ICD-10-CM | POA: Diagnosis not present

## 2024-06-30 DIAGNOSIS — M21172 Varus deformity, not elsewhere classified, left ankle: Secondary | ICD-10-CM | POA: Diagnosis not present

## 2024-06-30 DIAGNOSIS — E559 Vitamin D deficiency, unspecified: Secondary | ICD-10-CM | POA: Diagnosis not present

## 2024-06-30 DIAGNOSIS — E1165 Type 2 diabetes mellitus with hyperglycemia: Secondary | ICD-10-CM | POA: Diagnosis not present

## 2024-06-30 DIAGNOSIS — M86171 Other acute osteomyelitis, right ankle and foot: Secondary | ICD-10-CM | POA: Diagnosis not present

## 2024-06-30 DIAGNOSIS — T8140XA Infection following a procedure, unspecified, initial encounter: Secondary | ICD-10-CM | POA: Diagnosis not present

## 2024-06-30 DIAGNOSIS — E1151 Type 2 diabetes mellitus with diabetic peripheral angiopathy without gangrene: Secondary | ICD-10-CM | POA: Diagnosis not present

## 2024-06-30 DIAGNOSIS — E1140 Type 2 diabetes mellitus with diabetic neuropathy, unspecified: Secondary | ICD-10-CM | POA: Diagnosis not present

## 2024-06-30 DIAGNOSIS — I5032 Chronic diastolic (congestive) heart failure: Secondary | ICD-10-CM | POA: Diagnosis not present

## 2024-06-30 DIAGNOSIS — Z7984 Long term (current) use of oral hypoglycemic drugs: Secondary | ICD-10-CM | POA: Diagnosis not present

## 2024-06-30 DIAGNOSIS — D649 Anemia, unspecified: Secondary | ICD-10-CM | POA: Diagnosis not present

## 2024-06-30 DIAGNOSIS — E785 Hyperlipidemia, unspecified: Secondary | ICD-10-CM | POA: Diagnosis not present

## 2024-06-30 DIAGNOSIS — L02611 Cutaneous abscess of right foot: Secondary | ICD-10-CM | POA: Diagnosis not present

## 2024-06-30 DIAGNOSIS — J439 Emphysema, unspecified: Secondary | ICD-10-CM | POA: Diagnosis not present

## 2024-06-30 DIAGNOSIS — L97413 Non-pressure chronic ulcer of right heel and midfoot with necrosis of muscle: Secondary | ICD-10-CM | POA: Diagnosis not present

## 2024-06-30 DIAGNOSIS — I252 Old myocardial infarction: Secondary | ICD-10-CM | POA: Diagnosis not present

## 2024-06-30 DIAGNOSIS — I110 Hypertensive heart disease with heart failure: Secondary | ICD-10-CM | POA: Diagnosis not present

## 2024-06-30 DIAGNOSIS — N390 Urinary tract infection, site not specified: Secondary | ICD-10-CM | POA: Diagnosis not present

## 2024-06-30 DIAGNOSIS — M71071 Abscess of bursa, right ankle and foot: Secondary | ICD-10-CM | POA: Diagnosis not present

## 2024-06-30 NOTE — Telephone Encounter (Signed)
 Received voicemail in triage from [facility] team notifying provider of delay in care due to contacting another provider with they thought was the ordering provider at another location. Staff attempted to contact them back but Lonell was not available at this time. Staff faxed OPAT notes to [facility] offer at [phone] and noted on fax cover to contact our office if OPAT has not started due to delay so triage can updated providers for any treatment plan updates if needed.   Triage to continue to follow up. Routing to provider.   Enis Kleine, LPN

## 2024-06-30 NOTE — Progress Notes (Unsigned)
 Chief Complaint  Patient presents with   Post-op Problem    HOSPITAL POV # 1 DOS 6/27 [facility] HAVING POSSIBLE DRAINAGE  Date of surgery: 6/27  HPI: 63 y.o. female presenting today as hospital follow-up status post incision and drainage of right heel abscess.  She is on long-term antibiotics.  She has not had follow-up to this point, has canceled previous appointment due to with PICC line and being in the ED.  Husband has been assisting with dressing changes.  Past Medical History:  Diagnosis Date   Abnormal EKG 10/23/2021   Abnormal mammogram 08/09/2023   Abnormal mammogram of left breast 09/04/2021   Abnormal stress test 10/23/2021   Abscess of left elbow 02/18/2024   Absolute anemia 09/20/2022   Acquired varus deformity of foot, left    Acute chest pain 05/08/2016   Acute hemorrhagic cystitis 08/09/2023   AICD (automatic cardioverter/defibrillator) present    St Jude/Abbott device   AKI (acute kidney injury) (HCC) 10/23/2021   Amputation of right great toe (HCC) 09/20/2022   Angina pectoris (HCC) 09/11/2017   Anxiety 10/23/2021   B12 deficiency 09/20/2022   Bacteremia 11/22/2021   Breast wound, right, subsequent encounter 09/04/2021   Burping 05/08/2016   Cancer (HCC)    cervical - hysterectomy   Cellulitis of right breast 10/23/2021   Cellulitis, wound, post-operative 10/09/2021   Chronic diastolic heart failure (HCC) 11/06/2016   Chronic osteomyelitis of left foot (HCC) 10/16/2023   Colon cancer screening 09/20/2022   Coronary artery disease involving native coronary artery of native heart with angina pectoris (HCC) 09/12/2015   Overview:  PCI and stent of RCA 2009, last cath 2012 with medical therapy  CABG May 2017   Demand ischemia [facility])    Diabetes mellitus due to underlying condition with hyperglycemia, with long-term current use of insulin  (HCC) 09/20/2022   Diabetic foot infection (HCC) 11/21/2021   Diabetic  foot ulcer (HCC) 02/16/2020   Emphysema lung (HCC) 09/11/2017   patient not aware of this dx   Encounter for preoperative assessment 10/16/2023   Essential hypertension 09/12/2015   Gangrene (HCC) 10/23/2021   GERD (gastroesophageal reflux disease) 05/08/2016   Hematoma 10/23/2021   Hiatal hernia 10/23/2021   History of methicillin resistant staphylococcus aureus (MRSA)    Hyperglycemia 10/23/2021   Hyperglycemia due to type 2 diabetes mellitus (HCC) 10/23/2021   Hyperkalemia    Hyperlipidemia 09/12/2015   Hyponatremia 10/23/2021   ICD (implantable cardioverter-defibrillator) in place 05/16/2021   Infection of great toe 10/23/2021   Intertrigo 09/20/2022   Lactic acidosis 10/23/2021   Leukocytosis 10/23/2021   Mediastinitis 06/28/2016   Morbid obesity (HCC) 05/08/2016   Myocardial infarction (HCC)    Needs flu shot 09/20/2022   NSTEMI (non-ST elevated myocardial infarction) (HCC) 05/22/2020   Obesity (BMI 30-39.9) 10/23/2021   Partial nontraumatic amputation of left foot (HCC) 09/20/2022   Perineal abscess 10/23/2021   Peripheral vascular disease (HCC)    S/P CABG (coronary artery bypass graft) 06/03/2016   Overview:  The patient underwent sternal reconstruction on 06/21/16 with pec flaps for mediastinitis from a prior CABG in May 2017. On admission, she was critically ill from sepsis and had altered mental status. She was last seen in clinic on 08/09/16 at which time she was doing well.   Severe sepsis (HCC) 06/03/2016   Sinus tachycardia 05/08/2016   Tobacco use disorder 04/20/2016   Overview:  Quit in May 2017.   Type 2 diabetes mellitus with  foot ulcer (HCC) 05/08/2016   Uncontrolled type 2 diabetes mellitus with hyperglycemia (HCC) 02/18/2024   Unstable angina (HCC) 05/22/2020   Ventricular tachycardia (HCC) 12/17/2020   Vitamin D  deficiency 09/20/2022   Wound, surgical, infected 06/06/2016   Overview:  sternal   Past Surgical History:  Procedure Laterality Date    ABDOMINAL AORTOGRAM W/LOWER EXTREMITY N/A 10/11/2021   Procedure: ABDOMINAL AORTOGRAM W/LOWER EXTREMITY;  Surgeon: Gretta Lonni PARAS, MD;  [facility];  Service: Cardiovascular;  Laterality: N/A;   AMPUTATION TOE Right 02/20/2020   Procedure: AMPUTATION RIGHT GREAT  TOE;  Surgeon: Tobie Franky SQUIBB, DPM;  [facility]: [facility];  Service: Podiatry;  Laterality: Right;   BLADDER SURGERY     BONE BIOPSY Left 02/01/2022   Procedure: BONE BIOPSY LEFT FOOT, IRRIGATION AND DEBRIDEMENT;  Surgeon: Burt Fus, DPM;  [facility];  Service: Podiatry;  Laterality: Left;   BONE BIOPSY Left 10/31/2023   Procedure: BONE BIOPSY;  Surgeon: Janit Thresa HERO, DPM;  [facility]: [facility];  Service: Orthopedics/Podiatry;  Laterality: Left;   BUBBLE STUDY  11/27/2021   Procedure: BUBBLE STUDY;  Surgeon: Okey Vina GAILS, MD;  Location: [facility] ENDOSCOPY;  Service: Cardiovascular;;   CARDIAC CATHETERIZATION     CORONARY ARTERY BYPASS GRAFT     CORONARY STENT INTERVENTION N/A 05/25/2020   Procedure: CORONARY STENT INTERVENTION;  Surgeon: Darron Deatrice LABOR, MD;  [facility];  Service: Cardiovascular;  Laterality: N/A;   CORONARY STENT INTERVENTION N/A 06/22/2020   Procedure: CORONARY STENT INTERVENTION;  Surgeon: Anner Alm ORN, MD;  Location: [facility] INVASIVE CV LAB;  Service: Cardiovascular;  Laterality: N/A;   CORONARY ULTRASOUND/IVUS N/A 06/22/2020   Procedure: Intravascular Ultrasound/IVUS;  Surgeon: Anner Alm ORN, MD;  Location: [facility] INVASIVE CV LAB;  Service: Cardiovascular;  Laterality: N/A;   ICD IMPLANT N/A 12/19/2020   Procedure: ICD IMPLANT;  Surgeon: Waddell Danelle ORN, MD;  Location: [facility] INVASIVE CV LAB;  Service: Cardiovascular;  Laterality: N/A;   INCISION AND DRAINAGE Right 02/19/2020   Procedure: INCISION AND DRAINAGE;  Surgeon: Gershon Donnice SAUNDERS, DPM;  [facility];  Service: Podiatry;  Laterality: Right;  Block done by surgeon   INCISION AND DRAINAGE OF WOUND Left 10/31/2023   Procedure:  IRRIGATION AND DEBRIDEMENT WOUND;  Surgeon: Janit Thresa HERO, DPM;  Location: [facility];  Service: Orthopedics/Podiatry;  Laterality: Left;   IRRIGATION AND DEBRIDEMENT ABSCESS Right 06/12/2024   Procedure: IRRIGATION AND DEBRIDEMENT ABSCESS;  Surgeon: Malvin Marsa FALCON, DPM;  [facility];  Service: Orthopedics/Podiatry;  Laterality: Right;  I&D R heel, possible bone biopsy   IRRIGATION AND DEBRIDEMENT FOOT Left 10/10/2021   Procedure: IRRIGATION AND DEBRIDEMENT FOOT;  Surgeon: Burt Fus, DPM;  [facility];  Service: Podiatry;  Laterality: Left;   IRRIGATION AND DEBRIDEMENT FOOT Left 11/23/2021   Procedure: IRRIGATION AND DEBRIDEMENT FOOT;  Surgeon: Joya Stabs, MD;  [facility];  Service: Podiatry;  Laterality: Left;  will do local block   LEFT HEART CATH AND CORS/GRAFTS ANGIOGRAPHY N/A 05/23/2020   Procedure: LEFT HEART CATH AND CORS/GRAFTS ANGIOGRAPHY;  Surgeon: Mady Lonni, MD;  [facility];  Service: Cardiovascular;  Laterality: N/A;   LEFT HEART CATH AND CORS/GRAFTS ANGIOGRAPHY N/A 12/19/2020   Procedure: LEFT HEART CATH AND CORS/GRAFTS ANGIOGRAPHY;  Surgeon: Verlin Lonni BIRCH, MD;  [facility];  Service: Cardiovascular;  Laterality: N/A;   LEFT HEART CATH AND CORS/GRAFTS ANGIOGRAPHY N/A 07/21/2021   Procedure: LEFT HEART CATH AND CORS/GRAFTS  ANGIOGRAPHY;  Surgeon: Mady Bruckner, MD;  [facility];  Service: Cardiovascular;  Laterality: N/A;   METATARSAL HEAD EXCISION Right 05/02/2020   Procedure: FIRST METATARSAL HEAD RESECTION; RIGHT FOOT WOUND CLOSURE;  Surgeon: Burt Fus, DPM;  Location: [facility];  Service: Podiatry;  Laterality: Right;  MAC W/LOCAL   PERIPHERAL VASCULAR BALLOON ANGIOPLASTY Left 10/11/2021   Procedure: PERIPHERAL VASCULAR BALLOON ANGIOPLASTY;  Surgeon: Gretta Bruckner PARAS, MD;  [facility];  Service: Cardiovascular;  Laterality: Left;  Anterior Tibial  Artery   TEE WITHOUT CARDIOVERSION N/A 11/27/2021   Procedure: TRANSESOPHAGEAL ECHOCARDIOGRAM (TEE);  Surgeon: Okey Vina GAILS, MD;  Location: [facility] ENDOSCOPY;  Service: Cardiovascular;  Laterality: N/A;   TIBIALIS TENDON TRANSFER / REPAIR Left 12/06/2023   Procedure: TIBIALIS TENDON TRANSFER;  Surgeon: Janit Thresa HERO, DPM;  Location: [facility] ORS;  Service: Orthopedics/Podiatry;  Laterality: Left;   TRANSMETATARSAL AMPUTATION Left 11/23/2021   Procedure: TRANSMETATARSAL AMPUTATION;  Surgeon: Joya Stabs, MD;  Location: [facility] OR;  Service: Podiatry;  Laterality: Left;   TUBAL LIGATION     Allergies  Allergen Reactions   Vancomycin  Anaphylaxis   Chlorhexidine  Gluconate Itching    Received CHG bath, began itching, required benadryl    Ativan  [Lorazepam ] Other (See Comments)    hallucinations   Cat Dander Other (See Comments)    Sneezing, watery eyes.   Other Itching    Dial  Soap   Tramadol  Other (See Comments)    Hallucinations   Codeine Rash      PHYSICAL EXAM: There were no vitals filed for this visit.  General: The patient is alert and oriented x3 in no acute distress.  Dermatology: Skin is warm, dry and supple bilateral lower extremities. Interspaces are clear of maceration and debris.  Right heel ulceration present with entire area encompassing 3.5 x 3.5 cm, central area with dermal tissue involvement with I&D sites to the medial and lateral aspects.  These areas involve the deep fascia and muscle layer and measure 2.5 x 1 laterally and 3 x 1 cm medially.  Postdebridement measurements of these areas 2.7 x 1.2 cm laterally and 3.2 x 1.2 cm medially.  Scant drainage, no significant malodor, no surrounding erythema.  Fibrous slough noted to the wound beds of the I&D sites.  Some nonviable tissue noted within the wound bed centrally as well.    Vascular: Pedal pulses are palpable, capillary refill time intact.  Neurological: Light touch sensation decreased at the toes.  Protective sensation  decreased  Musculoskeletal Exam: Prior right partial first ray amputation.  Tenderness on palpation of lateral fifth metatarsal.     Latest Ref Rng & Units 06/11/2024   12:02 PM 02/18/2024    4:10 PM 12/05/2023    8:26 AM 10/09/2023    1:53 PM 08/09/2023   11:39 AM  Hemoglobin A1C  Hemoglobin-A1c 4.8 - 5.6 % 8.9  9.4  10.1  8.5  7.8      RADIOGRAPHIC EXAM: Right foot 3 views 06/30/24 Normal osseous mineralization.  No signs of osteolysis or cortical erosions seen to the calcaneus.  The oblique fifth metatarsal fracture with some displacement is again visualized.  Prior partial first ray amputation, chronic degeneration in the 2nd and 3rd metatarsal heads.  ASSESSMENT / PLAN OF CARE: 1. Skin ulcer of right heel with necrosis of muscle (HCC)      No orders of the defined types were placed in this encounter.  AMB REFERRAL TO HOME HEALTH AMB REFERRAL TO WOUND CARE  CENTER  Radiographs reviewed with patient, no signs of progressing osteomyelitis, do discuss fifth metatarsal fracture.  Unfortunately I do not think this can be addressed right now given the patient's ulcer requiring frequent wound care and concern for infection, inability to maintain nonweightbearing.  The incision and drainage of ulceration sites were sharply debrided of hyperkeratotic and devitalized soft tissue with sterile #15 blade, microcurette and tissue nipper to the level of muscle fascia including skin and subcutaneous tissue.  Hemostasis obtained.  The central wound bed required minimal debridement.  The I&D sites are packed with Aquacel Ag which was also used to dress the wound bed.  DSD and ABD, Kerlix, Ace wrap applied  Encouraged continued nonweightbearing, patient has been unable to do this unfortunately.  Will order updated home health orders for the patient as this wound care is complex and she would benefit from dressing changes multiple times a week.  Placing referral to [facility] health wound care center as  well, she would likely benefit from wound VAC therapy, possible in office grafts.  Reviewed daily dressing changes with patient. She is on long term IV antibiotics with infectious disease.  Discussed risks / concerns regarding ulcer with patient and possible sequelae if left untreated.  Stressed importance of infection prevention at home. Short-term goals are: prevent infection, off-load ulcer, heal ulcer Long-term goals are:  prevent recurrence, prevent amputation.   She has follow-up planned with Dr. Janit in 1 week in [place].  She may continue to follow-up with me after this for wound care until she is established with the wound care center.   Ethan LITTIE Saddler, DPM, AACFAS [facility]     [address] [place], [place] [address]                Office [phone]  Fax [phone]

## 2024-07-01 DIAGNOSIS — L03115 Cellulitis of right lower limb: Secondary | ICD-10-CM | POA: Diagnosis not present

## 2024-07-01 DIAGNOSIS — M86171 Other acute osteomyelitis, right ankle and foot: Secondary | ICD-10-CM | POA: Diagnosis not present

## 2024-07-01 NOTE — Telephone Encounter (Signed)
 Jasmine Richardson returned call, she confirms they have received OPAT orders. Patient had labs drawn yesterday.   [name], BSN, RN

## 2024-07-02 DIAGNOSIS — L03115 Cellulitis of right lower limb: Secondary | ICD-10-CM | POA: Diagnosis not present

## 2024-07-02 DIAGNOSIS — M86171 Other acute osteomyelitis, right ankle and foot: Secondary | ICD-10-CM | POA: Diagnosis not present

## 2024-07-02 DIAGNOSIS — E113312 Type 2 diabetes mellitus with moderate nonproliferative diabetic retinopathy with macular edema, left eye: Secondary | ICD-10-CM | POA: Diagnosis not present

## 2024-07-03 DIAGNOSIS — M86171 Other acute osteomyelitis, right ankle and foot: Secondary | ICD-10-CM | POA: Diagnosis not present

## 2024-07-03 DIAGNOSIS — L03115 Cellulitis of right lower limb: Secondary | ICD-10-CM | POA: Diagnosis not present

## 2024-07-04 DIAGNOSIS — L03115 Cellulitis of right lower limb: Secondary | ICD-10-CM | POA: Diagnosis not present

## 2024-07-04 DIAGNOSIS — M86171 Other acute osteomyelitis, right ankle and foot: Secondary | ICD-10-CM | POA: Diagnosis not present

## 2024-07-05 DIAGNOSIS — M86171 Other acute osteomyelitis, right ankle and foot: Secondary | ICD-10-CM | POA: Diagnosis not present

## 2024-07-05 DIAGNOSIS — L03115 Cellulitis of right lower limb: Secondary | ICD-10-CM | POA: Diagnosis not present

## 2024-07-06 DIAGNOSIS — I110 Hypertensive heart disease with heart failure: Secondary | ICD-10-CM | POA: Diagnosis not present

## 2024-07-06 DIAGNOSIS — I2510 Atherosclerotic heart disease of native coronary artery without angina pectoris: Secondary | ICD-10-CM | POA: Diagnosis not present

## 2024-07-06 DIAGNOSIS — E1151 Type 2 diabetes mellitus with diabetic peripheral angiopathy without gangrene: Secondary | ICD-10-CM | POA: Diagnosis not present

## 2024-07-06 DIAGNOSIS — M21172 Varus deformity, not elsewhere classified, left ankle: Secondary | ICD-10-CM | POA: Diagnosis not present

## 2024-07-06 DIAGNOSIS — F419 Anxiety disorder, unspecified: Secondary | ICD-10-CM | POA: Diagnosis not present

## 2024-07-06 DIAGNOSIS — E538 Deficiency of other specified B group vitamins: Secondary | ICD-10-CM | POA: Diagnosis not present

## 2024-07-06 DIAGNOSIS — I252 Old myocardial infarction: Secondary | ICD-10-CM | POA: Diagnosis not present

## 2024-07-06 DIAGNOSIS — M71071 Abscess of bursa, right ankle and foot: Secondary | ICD-10-CM | POA: Diagnosis not present

## 2024-07-06 DIAGNOSIS — Z7984 Long term (current) use of oral hypoglycemic drugs: Secondary | ICD-10-CM | POA: Diagnosis not present

## 2024-07-06 DIAGNOSIS — S91341D Puncture wound with foreign body, right foot, subsequent encounter: Secondary | ICD-10-CM | POA: Diagnosis not present

## 2024-07-06 DIAGNOSIS — L02611 Cutaneous abscess of right foot: Secondary | ICD-10-CM | POA: Diagnosis not present

## 2024-07-06 DIAGNOSIS — B951 Streptococcus, group B, as the cause of diseases classified elsewhere: Secondary | ICD-10-CM | POA: Diagnosis not present

## 2024-07-06 DIAGNOSIS — I472 Ventricular tachycardia, unspecified: Secondary | ICD-10-CM | POA: Diagnosis not present

## 2024-07-06 DIAGNOSIS — J439 Emphysema, unspecified: Secondary | ICD-10-CM | POA: Diagnosis not present

## 2024-07-06 DIAGNOSIS — E559 Vitamin D deficiency, unspecified: Secondary | ICD-10-CM | POA: Diagnosis not present

## 2024-07-06 DIAGNOSIS — E1165 Type 2 diabetes mellitus with hyperglycemia: Secondary | ICD-10-CM | POA: Diagnosis not present

## 2024-07-06 DIAGNOSIS — I5032 Chronic diastolic (congestive) heart failure: Secondary | ICD-10-CM | POA: Diagnosis not present

## 2024-07-06 DIAGNOSIS — M86171 Other acute osteomyelitis, right ankle and foot: Secondary | ICD-10-CM | POA: Diagnosis not present

## 2024-07-06 DIAGNOSIS — L03115 Cellulitis of right lower limb: Secondary | ICD-10-CM | POA: Diagnosis not present

## 2024-07-06 DIAGNOSIS — E785 Hyperlipidemia, unspecified: Secondary | ICD-10-CM | POA: Diagnosis not present

## 2024-07-06 DIAGNOSIS — D649 Anemia, unspecified: Secondary | ICD-10-CM | POA: Diagnosis not present

## 2024-07-06 DIAGNOSIS — E1140 Type 2 diabetes mellitus with diabetic neuropathy, unspecified: Secondary | ICD-10-CM | POA: Diagnosis not present

## 2024-07-06 DIAGNOSIS — E1169 Type 2 diabetes mellitus with other specified complication: Secondary | ICD-10-CM | POA: Diagnosis not present

## 2024-07-06 DIAGNOSIS — N390 Urinary tract infection, site not specified: Secondary | ICD-10-CM | POA: Diagnosis not present

## 2024-07-07 ENCOUNTER — Inpatient Hospital Stay: Admitting: Physician Assistant

## 2024-07-07 ENCOUNTER — Ambulatory Visit: Admitting: Physician Assistant

## 2024-07-07 DIAGNOSIS — M86171 Other acute osteomyelitis, right ankle and foot: Secondary | ICD-10-CM | POA: Diagnosis not present

## 2024-07-07 DIAGNOSIS — L03115 Cellulitis of right lower limb: Secondary | ICD-10-CM | POA: Diagnosis not present

## 2024-07-08 ENCOUNTER — Ambulatory Visit (INDEPENDENT_AMBULATORY_CARE_PROVIDER_SITE_OTHER): Admitting: Podiatry

## 2024-07-08 DIAGNOSIS — E08621 Diabetes mellitus due to underlying condition with foot ulcer: Secondary | ICD-10-CM

## 2024-07-08 DIAGNOSIS — M86171 Other acute osteomyelitis, right ankle and foot: Secondary | ICD-10-CM | POA: Diagnosis not present

## 2024-07-08 DIAGNOSIS — L03115 Cellulitis of right lower limb: Secondary | ICD-10-CM | POA: Diagnosis not present

## 2024-07-08 DIAGNOSIS — L97412 Non-pressure chronic ulcer of right heel and midfoot with fat layer exposed: Secondary | ICD-10-CM | POA: Diagnosis not present

## 2024-07-09 DIAGNOSIS — L03115 Cellulitis of right lower limb: Secondary | ICD-10-CM | POA: Diagnosis not present

## 2024-07-09 DIAGNOSIS — M86171 Other acute osteomyelitis, right ankle and foot: Secondary | ICD-10-CM | POA: Diagnosis not present

## 2024-07-10 ENCOUNTER — Other Ambulatory Visit (HOSPITAL_BASED_OUTPATIENT_CLINIC_OR_DEPARTMENT_OTHER): Payer: Self-pay

## 2024-07-10 DIAGNOSIS — L03115 Cellulitis of right lower limb: Secondary | ICD-10-CM | POA: Diagnosis not present

## 2024-07-10 DIAGNOSIS — M86171 Other acute osteomyelitis, right ankle and foot: Secondary | ICD-10-CM | POA: Diagnosis not present

## 2024-07-11 DIAGNOSIS — L03115 Cellulitis of right lower limb: Secondary | ICD-10-CM | POA: Diagnosis not present

## 2024-07-11 DIAGNOSIS — M86171 Other acute osteomyelitis, right ankle and foot: Secondary | ICD-10-CM | POA: Diagnosis not present

## 2024-07-12 DIAGNOSIS — L03115 Cellulitis of right lower limb: Secondary | ICD-10-CM | POA: Diagnosis not present

## 2024-07-12 DIAGNOSIS — M86171 Other acute osteomyelitis, right ankle and foot: Secondary | ICD-10-CM | POA: Diagnosis not present

## 2024-07-13 ENCOUNTER — Other Ambulatory Visit: Payer: Self-pay | Admitting: Physician Assistant

## 2024-07-13 ENCOUNTER — Other Ambulatory Visit (HOSPITAL_BASED_OUTPATIENT_CLINIC_OR_DEPARTMENT_OTHER): Payer: Self-pay

## 2024-07-13 DIAGNOSIS — B951 Streptococcus, group B, as the cause of diseases classified elsewhere: Secondary | ICD-10-CM | POA: Diagnosis not present

## 2024-07-13 DIAGNOSIS — E559 Vitamin D deficiency, unspecified: Secondary | ICD-10-CM | POA: Diagnosis not present

## 2024-07-13 DIAGNOSIS — I5032 Chronic diastolic (congestive) heart failure: Secondary | ICD-10-CM | POA: Diagnosis not present

## 2024-07-13 DIAGNOSIS — S91341D Puncture wound with foreign body, right foot, subsequent encounter: Secondary | ICD-10-CM | POA: Diagnosis not present

## 2024-07-13 DIAGNOSIS — E1151 Type 2 diabetes mellitus with diabetic peripheral angiopathy without gangrene: Secondary | ICD-10-CM | POA: Diagnosis not present

## 2024-07-13 DIAGNOSIS — M21172 Varus deformity, not elsewhere classified, left ankle: Secondary | ICD-10-CM | POA: Diagnosis not present

## 2024-07-13 DIAGNOSIS — J439 Emphysema, unspecified: Secondary | ICD-10-CM | POA: Diagnosis not present

## 2024-07-13 DIAGNOSIS — Z7984 Long term (current) use of oral hypoglycemic drugs: Secondary | ICD-10-CM | POA: Diagnosis not present

## 2024-07-13 DIAGNOSIS — E1165 Type 2 diabetes mellitus with hyperglycemia: Secondary | ICD-10-CM | POA: Diagnosis not present

## 2024-07-13 DIAGNOSIS — M86171 Other acute osteomyelitis, right ankle and foot: Secondary | ICD-10-CM | POA: Diagnosis not present

## 2024-07-13 DIAGNOSIS — I110 Hypertensive heart disease with heart failure: Secondary | ICD-10-CM | POA: Diagnosis not present

## 2024-07-13 DIAGNOSIS — E1140 Type 2 diabetes mellitus with diabetic neuropathy, unspecified: Secondary | ICD-10-CM | POA: Diagnosis not present

## 2024-07-13 DIAGNOSIS — E1169 Type 2 diabetes mellitus with other specified complication: Secondary | ICD-10-CM | POA: Diagnosis not present

## 2024-07-13 DIAGNOSIS — M71071 Abscess of bursa, right ankle and foot: Secondary | ICD-10-CM | POA: Diagnosis not present

## 2024-07-13 DIAGNOSIS — N390 Urinary tract infection, site not specified: Secondary | ICD-10-CM | POA: Diagnosis not present

## 2024-07-13 DIAGNOSIS — L89610 Pressure ulcer of right heel, unstageable: Secondary | ICD-10-CM | POA: Diagnosis not present

## 2024-07-13 DIAGNOSIS — E785 Hyperlipidemia, unspecified: Secondary | ICD-10-CM | POA: Diagnosis not present

## 2024-07-13 DIAGNOSIS — I2510 Atherosclerotic heart disease of native coronary artery without angina pectoris: Secondary | ICD-10-CM | POA: Diagnosis not present

## 2024-07-13 DIAGNOSIS — I252 Old myocardial infarction: Secondary | ICD-10-CM | POA: Diagnosis not present

## 2024-07-13 DIAGNOSIS — F419 Anxiety disorder, unspecified: Secondary | ICD-10-CM | POA: Diagnosis not present

## 2024-07-13 DIAGNOSIS — D649 Anemia, unspecified: Secondary | ICD-10-CM | POA: Diagnosis not present

## 2024-07-13 DIAGNOSIS — E538 Deficiency of other specified B group vitamins: Secondary | ICD-10-CM | POA: Diagnosis not present

## 2024-07-13 DIAGNOSIS — L03115 Cellulitis of right lower limb: Secondary | ICD-10-CM | POA: Diagnosis not present

## 2024-07-13 DIAGNOSIS — L97413 Non-pressure chronic ulcer of right heel and midfoot with necrosis of muscle: Secondary | ICD-10-CM | POA: Diagnosis not present

## 2024-07-13 DIAGNOSIS — I472 Ventricular tachycardia, unspecified: Secondary | ICD-10-CM | POA: Diagnosis not present

## 2024-07-13 DIAGNOSIS — L02611 Cutaneous abscess of right foot: Secondary | ICD-10-CM | POA: Diagnosis not present

## 2024-07-13 MED ORDER — DEXCOM G7 SENSOR MISC
1.0000 | 12 refills | Status: AC
  Filled 2024-07-13 – 2024-07-22 (×2): qty 3, 30d supply, fill #0
  Filled 2024-09-28: qty 3, 30d supply, fill #1
  Filled 2024-10-19 – 2024-10-21 (×2): qty 3, 30d supply, fill #2
  Filled 2024-12-21: qty 3, 30d supply, fill #3

## 2024-07-13 NOTE — Progress Notes (Signed)
 Chief Complaint  Patient presents with   Routine Post Op    Dressings off, packing still in. Pain is ok but stiff at night.     Subjective:  63 y.o. female with PMHx of diabetes mellitus presenting to the office status post irrigation and debridement right plantar heel performed inpatient on 06/12/2024.  Dr. Marolyn Honour.   Past Medical History:  Diagnosis Date   Abnormal EKG 10/23/2021   Abnormal mammogram 08/09/2023   Abnormal mammogram of left breast 09/04/2021   Abnormal stress test 10/23/2021   Abscess of left elbow 02/18/2024   Absolute anemia 09/20/2022   Acquired varus deformity of foot, left    Acute chest pain 05/08/2016   Acute hemorrhagic cystitis 08/09/2023   AICD (automatic cardioverter/defibrillator) present    St Jude/Abbott device   AKI (acute kidney injury) (HCC) 10/23/2021   Amputation of right great toe (HCC) 09/20/2022   Angina pectoris (HCC) 09/11/2017   Anxiety 10/23/2021   B12 deficiency 09/20/2022   Bacteremia 11/22/2021   Breast wound, right, subsequent encounter 09/04/2021   Burping 05/08/2016   Cancer (HCC)    cervical - hysterectomy   Cellulitis of right breast 10/23/2021   Cellulitis, wound, post-operative 10/09/2021   Chronic diastolic heart failure (HCC) 11/06/2016   Chronic osteomyelitis of left foot (HCC) 10/16/2023   Colon cancer screening 09/20/2022   Coronary artery disease involving native coronary artery of native heart with angina pectoris (HCC) 09/12/2015   Overview:  PCI and stent of RCA 2009, last cath 2012 with medical therapy  CABG May 2017   Demand ischemia [facility])    Diabetes mellitus due to underlying condition with hyperglycemia, with long-term current use of insulin  (HCC) 09/20/2022   Diabetic foot infection (HCC) 11/21/2021   Diabetic foot ulcer (HCC) 02/16/2020   Emphysema lung (HCC) 09/11/2017   patient not aware of this dx   Encounter for preoperative assessment 10/16/2023   Essential hypertension 09/12/2015    Gangrene (HCC) 10/23/2021   GERD (gastroesophageal reflux disease) 05/08/2016   Hematoma 10/23/2021   Hiatal hernia 10/23/2021   History of methicillin resistant staphylococcus aureus (MRSA)    Hyperglycemia 10/23/2021   Hyperglycemia due to type 2 diabetes mellitus (HCC) 10/23/2021   Hyperkalemia    Hyperlipidemia 09/12/2015   Hyponatremia 10/23/2021   ICD (implantable cardioverter-defibrillator) in place 05/16/2021   Infection of great toe 10/23/2021   Intertrigo 09/20/2022   Lactic acidosis 10/23/2021   Leukocytosis 10/23/2021   Mediastinitis 06/28/2016   Morbid obesity (HCC) 05/08/2016   Myocardial infarction (HCC)    Needs flu shot 09/20/2022   NSTEMI (non-ST elevated myocardial infarction) (HCC) 05/22/2020   Obesity (BMI 30-39.9) 10/23/2021   Partial nontraumatic amputation of left foot (HCC) 09/20/2022   Perineal abscess 10/23/2021   Peripheral vascular disease (HCC)    S/P CABG (coronary artery bypass graft) 06/03/2016   Overview:  The patient underwent sternal reconstruction on 06/21/16 with pec flaps for mediastinitis from a prior CABG in May 2017. On admission, she was critically ill from sepsis and had altered mental status. She was last seen in clinic on 08/09/16 at which time she was doing well.   Severe sepsis (HCC) 06/03/2016   Sinus tachycardia 05/08/2016   Tobacco use disorder 04/20/2016   Overview:  Quit in May 2017.   Type 2 diabetes mellitus with foot ulcer (HCC) 05/08/2016   Uncontrolled type 2 diabetes mellitus with hyperglycemia (HCC) 02/18/2024   Unstable angina (HCC) 05/22/2020   Ventricular tachycardia (HCC) 12/17/2020  Vitamin D  deficiency 09/20/2022   Wound, surgical, infected 06/06/2016   Overview:  sternal    Past Surgical History:  Procedure Laterality Date   ABDOMINAL AORTOGRAM W/LOWER EXTREMITY N/A 10/11/2021   Procedure: ABDOMINAL AORTOGRAM W/LOWER EXTREMITY;  Surgeon: Gretta Lonni PARAS, MD;  [facility];  Service:  Cardiovascular;  Laterality: N/A;   AMPUTATION TOE Right 02/20/2020   Procedure: AMPUTATION RIGHT GREAT  TOE;  Surgeon: Tobie Franky SQUIBB, DPM;  Location: [facility];  Service: Podiatry;  Laterality: Right;   BLADDER SURGERY     BONE BIOPSY Left 02/01/2022   Procedure: BONE BIOPSY LEFT FOOT, IRRIGATION AND DEBRIDEMENT;  Surgeon: Burt Fus, DPM;  [facility]: [facility];  Service: Podiatry;  Laterality: Left;   BONE BIOPSY Left 10/31/2023   Procedure: BONE BIOPSY;  Surgeon: Janit Thresa HERO, DPM;  Location: [facility];  Service: Orthopedics/Podiatry;  Laterality: Left;   BUBBLE STUDY  11/27/2021   Procedure: BUBBLE STUDY;  Surgeon: Okey Vina GAILS, MD;  Location: [facility] ENDOSCOPY;  Service: Cardiovascular;;   CARDIAC CATHETERIZATION     CORONARY ARTERY BYPASS GRAFT     CORONARY STENT INTERVENTION N/A 05/25/2020   Procedure: CORONARY STENT INTERVENTION;  Surgeon: Darron Deatrice LABOR, MD;  [facility];  Service: Cardiovascular;  Laterality: N/A;   CORONARY STENT INTERVENTION N/A 06/22/2020   Procedure: CORONARY STENT INTERVENTION;  Surgeon: Anner Alm ORN, MD;  Location: [facility] INVASIVE CV LAB;  Service: Cardiovascular;  Laterality: N/A;   CORONARY ULTRASOUND/IVUS N/A 06/22/2020   Procedure: Intravascular Ultrasound/IVUS;  Surgeon: Anner Alm ORN, MD;  Location: [facility] INVASIVE CV LAB;  Service: Cardiovascular;  Laterality: N/A;   ICD IMPLANT N/A 12/19/2020   Procedure: ICD IMPLANT;  Surgeon: Waddell Danelle ORN, MD;  Location: [facility] INVASIVE CV LAB;  Service: Cardiovascular;  Laterality: N/A;   INCISION AND DRAINAGE Right 02/19/2020   Procedure: INCISION AND DRAINAGE;  Surgeon: Gershon Donnice SAUNDERS, DPM;  [facility]: [facility];  Service: Podiatry;  Laterality: Right;  Block done by surgeon   INCISION AND DRAINAGE OF WOUND Left 10/31/2023   Procedure: IRRIGATION AND DEBRIDEMENT WOUND;  Surgeon: Janit Thresa HERO, DPM;  Location: [facility];  Service: Orthopedics/Podiatry;  Laterality: Left;   IRRIGATION AND DEBRIDEMENT ABSCESS Right  06/12/2024   Procedure: IRRIGATION AND DEBRIDEMENT ABSCESS;  Surgeon: Malvin Marsa FALCON, DPM;  [facility]: [facility];  Service: Orthopedics/Podiatry;  Laterality: Right;  I&D R heel, possible bone biopsy   IRRIGATION AND DEBRIDEMENT FOOT Left 10/10/2021   Procedure: IRRIGATION AND DEBRIDEMENT FOOT;  Surgeon: Burt Fus, DPM;  [facility];  Service: Podiatry;  Laterality: Left;   IRRIGATION AND DEBRIDEMENT FOOT Left 11/23/2021   Procedure: IRRIGATION AND DEBRIDEMENT FOOT;  Surgeon: Joya Stabs, MD;  [facility];  Service: Podiatry;  Laterality: Left;  will do local block   LEFT HEART CATH AND CORS/GRAFTS ANGIOGRAPHY N/A 05/23/2020   Procedure: LEFT HEART CATH AND CORS/GRAFTS ANGIOGRAPHY;  Surgeon: Mady Lonni, MD;  [facility];  Service: Cardiovascular;  Laterality: N/A;   LEFT HEART CATH AND CORS/GRAFTS ANGIOGRAPHY N/A 12/19/2020   Procedure: LEFT HEART CATH AND CORS/GRAFTS ANGIOGRAPHY;  Surgeon: Verlin Lonni BIRCH, MD;  [facility];  Service: Cardiovascular;  Laterality: N/A;   LEFT HEART CATH AND CORS/GRAFTS ANGIOGRAPHY N/A 07/21/2021   Procedure: LEFT HEART CATH AND CORS/GRAFTS ANGIOGRAPHY;  Surgeon: Mady Lonni, MD;  [facility];  Service: Cardiovascular;  Laterality: N/A;   METATARSAL HEAD EXCISION Right 05/02/2020   Procedure:  FIRST METATARSAL HEAD RESECTION; RIGHT FOOT WOUND CLOSURE;  Surgeon: Burt Fus, DPM;  Location: [facility];  Service: Podiatry;  Laterality: Right;  MAC W/LOCAL   PERIPHERAL VASCULAR BALLOON ANGIOPLASTY Left 10/11/2021   Procedure: PERIPHERAL VASCULAR BALLOON ANGIOPLASTY;  Surgeon: Gretta Lonni PARAS, MD;  [facility];  Service: Cardiovascular;  Laterality: Left;  Anterior Tibial Artery   TEE WITHOUT CARDIOVERSION N/A 11/27/2021   Procedure: TRANSESOPHAGEAL ECHOCARDIOGRAM (TEE);  Surgeon: Okey Vina GAILS, MD;  Location: [facility] ENDOSCOPY;  Service: Cardiovascular;   Laterality: N/A;   TIBIALIS TENDON TRANSFER / REPAIR Left 12/06/2023   Procedure: TIBIALIS TENDON TRANSFER;  Surgeon: Janit Thresa HERO, DPM;  Location: [facility] ORS;  Service: Orthopedics/Podiatry;  Laterality: Left;   TRANSMETATARSAL AMPUTATION Left 11/23/2021   Procedure: TRANSMETATARSAL AMPUTATION;  Surgeon: Joya Stabs, MD;  Location: [facility] OR;  Service: Podiatry;  Laterality: Left;   TUBAL LIGATION      Allergies  Allergen Reactions   Vancomycin  Anaphylaxis   Chlorhexidine  Gluconate Itching    Received CHG bath, began itching, required benadryl    Ativan  [Lorazepam ] Other (See Comments)    hallucinations   Cat Dander Other (See Comments)    Sneezing, watery eyes.   Other Itching    Dial  Soap   Tramadol  Other (See Comments)    Hallucinations   Codeine Rash    RT heel 06/30/2024  Objective/Physical Exam General: The patient is alert and oriented x3 in no acute distress.  Dermatology:  Wound #1 noted to the right plantar heel measuring approximately 4.5 x 4.5 x 0.5 cm (LxWxD).  Intermixed granular and fibrotic tissue with adipose tissue along the plantar portion of the fat pad.  No exposed bone.  No malodor.  Scant serous drainage.  Vascular: Palpable pedal pulses bilaterally. No edema or erythema noted. Capillary refill within normal limits.  Clinically no concern for vascular compromise  Neurological: Light touch and protective threshold diminished bilaterally.   Musculoskeletal Exam: History of TMA LLE w/ TA tendon transfer  Assessment: 1.  Ulcer right plantar heel secondary to diabetes mellitus 2. diabetes mellitus w/ peripheral neuropathy 3. S/p RT heel irrigation and debridement.  DOS: 06/12/2024. inpatient.  Dr. Malvin   Plan of Care:  -Patient was evaluated. -Medically necessary excisional debridement including subcutaneous tissue was performed using a tissue nipper and a chisel blade. Excisional debridement of all the necrotic nonviable tissue down to healthy  bleeding viable tissue was performed with post-debridement measurements same as pre-. -The wound was cleansed and dry sterile dressing applied. -Continue dressing changes at home.  Betadine  wet-to-dry -Continue offloading -Return to clinic 2 weeks    Thresa EMERSON Janit, DPM [facility]  Dr. Thresa EMERSON Janit, DPM    [address] [place], [place] [address]                Office [phone]  Fax [phone]

## 2024-07-13 NOTE — Telephone Encounter (Signed)
 Copied from CRM [id number]. Topic: Clinical - Medication Refill >> Jul 13, 2024 11:14 AM Montie POUR wrote: Medication: Continuous Glucose Sensor (DEXCOM G7 SENSOR) MISC   Has the patient contacted their pharmacy? Yes (Agent: If no, request that the patient contact the pharmacy for the refill. If patient does not wish to contact the pharmacy document the reason why and proceed with request.) (Agent: If yes, when and what did the pharmacy advise?) Pharmacy needs order to refill She has tried her last 3 and they will pair with her monitor.   This is the patient's preferred pharmacy:  [facility] [address] [place] [address] Phone: [phone] Fax: [phone]  Is this the correct pharmacy for this prescription? Yes If no, delete pharmacy and type the correct one.   Has the prescription been filled recently? No  Is the patient out of the medication? Yes  Has the patient been seen for an appointment in the last year OR does the patient have an upcoming appointment? Yes  Can we respond through MyChart? No   Agent: Please be advised that Rx refills may take up to 3 business days. We ask that you follow-up with your pharmacy.

## 2024-07-14 ENCOUNTER — Inpatient Hospital Stay: Admitting: Physician Assistant

## 2024-07-14 DIAGNOSIS — M86171 Other acute osteomyelitis, right ankle and foot: Secondary | ICD-10-CM | POA: Diagnosis not present

## 2024-07-14 DIAGNOSIS — L03115 Cellulitis of right lower limb: Secondary | ICD-10-CM | POA: Diagnosis not present

## 2024-07-15 DIAGNOSIS — L03115 Cellulitis of right lower limb: Secondary | ICD-10-CM | POA: Diagnosis not present

## 2024-07-15 DIAGNOSIS — M86171 Other acute osteomyelitis, right ankle and foot: Secondary | ICD-10-CM | POA: Diagnosis not present

## 2024-07-16 DIAGNOSIS — L03115 Cellulitis of right lower limb: Secondary | ICD-10-CM | POA: Diagnosis not present

## 2024-07-16 DIAGNOSIS — M86171 Other acute osteomyelitis, right ankle and foot: Secondary | ICD-10-CM | POA: Diagnosis not present

## 2024-07-17 DIAGNOSIS — L03115 Cellulitis of right lower limb: Secondary | ICD-10-CM | POA: Diagnosis not present

## 2024-07-17 DIAGNOSIS — M86171 Other acute osteomyelitis, right ankle and foot: Secondary | ICD-10-CM | POA: Diagnosis not present

## 2024-07-18 DIAGNOSIS — L03115 Cellulitis of right lower limb: Secondary | ICD-10-CM | POA: Diagnosis not present

## 2024-07-18 DIAGNOSIS — M86171 Other acute osteomyelitis, right ankle and foot: Secondary | ICD-10-CM | POA: Diagnosis not present

## 2024-07-19 DIAGNOSIS — M86171 Other acute osteomyelitis, right ankle and foot: Secondary | ICD-10-CM | POA: Diagnosis not present

## 2024-07-19 DIAGNOSIS — L03115 Cellulitis of right lower limb: Secondary | ICD-10-CM | POA: Diagnosis not present

## 2024-07-20 DIAGNOSIS — I472 Ventricular tachycardia, unspecified: Secondary | ICD-10-CM | POA: Diagnosis not present

## 2024-07-20 DIAGNOSIS — E1140 Type 2 diabetes mellitus with diabetic neuropathy, unspecified: Secondary | ICD-10-CM | POA: Diagnosis not present

## 2024-07-20 DIAGNOSIS — E538 Deficiency of other specified B group vitamins: Secondary | ICD-10-CM | POA: Diagnosis not present

## 2024-07-20 DIAGNOSIS — I2510 Atherosclerotic heart disease of native coronary artery without angina pectoris: Secondary | ICD-10-CM | POA: Diagnosis not present

## 2024-07-20 DIAGNOSIS — E559 Vitamin D deficiency, unspecified: Secondary | ICD-10-CM | POA: Diagnosis not present

## 2024-07-20 DIAGNOSIS — E1165 Type 2 diabetes mellitus with hyperglycemia: Secondary | ICD-10-CM | POA: Diagnosis not present

## 2024-07-20 DIAGNOSIS — J439 Emphysema, unspecified: Secondary | ICD-10-CM | POA: Diagnosis not present

## 2024-07-20 DIAGNOSIS — I110 Hypertensive heart disease with heart failure: Secondary | ICD-10-CM | POA: Diagnosis not present

## 2024-07-20 DIAGNOSIS — E785 Hyperlipidemia, unspecified: Secondary | ICD-10-CM | POA: Diagnosis not present

## 2024-07-20 DIAGNOSIS — M21172 Varus deformity, not elsewhere classified, left ankle: Secondary | ICD-10-CM | POA: Diagnosis not present

## 2024-07-20 DIAGNOSIS — E1151 Type 2 diabetes mellitus with diabetic peripheral angiopathy without gangrene: Secondary | ICD-10-CM | POA: Diagnosis not present

## 2024-07-20 DIAGNOSIS — L03115 Cellulitis of right lower limb: Secondary | ICD-10-CM | POA: Diagnosis not present

## 2024-07-20 DIAGNOSIS — M86171 Other acute osteomyelitis, right ankle and foot: Secondary | ICD-10-CM | POA: Diagnosis not present

## 2024-07-20 DIAGNOSIS — D649 Anemia, unspecified: Secondary | ICD-10-CM | POA: Diagnosis not present

## 2024-07-20 DIAGNOSIS — I252 Old myocardial infarction: Secondary | ICD-10-CM | POA: Diagnosis not present

## 2024-07-20 DIAGNOSIS — I5032 Chronic diastolic (congestive) heart failure: Secondary | ICD-10-CM | POA: Diagnosis not present

## 2024-07-20 DIAGNOSIS — N390 Urinary tract infection, site not specified: Secondary | ICD-10-CM | POA: Diagnosis not present

## 2024-07-20 DIAGNOSIS — Z7984 Long term (current) use of oral hypoglycemic drugs: Secondary | ICD-10-CM | POA: Diagnosis not present

## 2024-07-20 DIAGNOSIS — M71071 Abscess of bursa, right ankle and foot: Secondary | ICD-10-CM | POA: Diagnosis not present

## 2024-07-20 DIAGNOSIS — F419 Anxiety disorder, unspecified: Secondary | ICD-10-CM | POA: Diagnosis not present

## 2024-07-20 DIAGNOSIS — E1169 Type 2 diabetes mellitus with other specified complication: Secondary | ICD-10-CM | POA: Diagnosis not present

## 2024-07-20 DIAGNOSIS — L02611 Cutaneous abscess of right foot: Secondary | ICD-10-CM | POA: Diagnosis not present

## 2024-07-20 DIAGNOSIS — S91341D Puncture wound with foreign body, right foot, subsequent encounter: Secondary | ICD-10-CM | POA: Diagnosis not present

## 2024-07-20 DIAGNOSIS — B951 Streptococcus, group B, as the cause of diseases classified elsewhere: Secondary | ICD-10-CM | POA: Diagnosis not present

## 2024-07-21 ENCOUNTER — Other Ambulatory Visit: Payer: Self-pay

## 2024-07-21 ENCOUNTER — Encounter: Payer: Self-pay | Admitting: Family

## 2024-07-21 ENCOUNTER — Ambulatory Visit (INDEPENDENT_AMBULATORY_CARE_PROVIDER_SITE_OTHER): Payer: Self-pay | Admitting: Family

## 2024-07-21 ENCOUNTER — Inpatient Hospital Stay: Admitting: Physician Assistant

## 2024-07-21 ENCOUNTER — Telehealth: Payer: Self-pay

## 2024-07-21 VITALS — BP 150/69 | HR 75 | Temp 97.7°F

## 2024-07-21 DIAGNOSIS — L03115 Cellulitis of right lower limb: Secondary | ICD-10-CM | POA: Diagnosis not present

## 2024-07-21 DIAGNOSIS — M86171 Other acute osteomyelitis, right ankle and foot: Secondary | ICD-10-CM | POA: Diagnosis not present

## 2024-07-21 NOTE — Telephone Encounter (Signed)
 Attempted to call patient on both numbers to reschedule missed appointment. Left voicemail requesting call back. Per [place] NP okay for picc to be pulled after last dose. Community message sent with order. [name] Code

## 2024-07-21 NOTE — Assessment & Plan Note (Addendum)
 Jasmine Richardson has completed approximately 6 weeks of ceftriaxone  and Flagyl  for cellulitis of the right foot with abscess status postdebridement and question of possible calcaneal osteomyelitis.  Infection appears to be adequately controlled with inflammatory markers being normal and no evidence of other infection including drainage or odor.  Discussed plan of care to complete ceftriaxone  and metronidazole  on 07/23/2024 as prescribed and will ensure PICC line is removed at that time.  No additional antibiotics are necessary.  Advised to continue with wound care per podiatry/wound care providers.  Emphasize protein intake and optimize blood sugar control and may benefit from nutritional counseling.  Given diabetes and vascular disease at increased for complicated healing and infection. Follow-up with ID as needed.

## 2024-07-21 NOTE — Progress Notes (Signed)
 Subjective:   [name], female    DOB: Feb 26, 1961, 63 y.o.   MRN: [id number]  Chief Complaint  Patient presents with   Follow-up    Right foot infection and possible calcaneal osteomyelitis    HPI:  Jasmine Richardson is a 63 y.o. female with type 2 diabetes and peripheral vascular disease recently admitted to the hospital right foot infection following stepping on glass status post removal and found to have right calcaneal abscess with concern for osteomyelitis.  Cultures positive for group B streptococcus.  Last seen by Dr. Fleeta Rothman on 06/16/2024 with plan for ceftriaxone  and Flagyl  for 6 weeks with end date of 07/23/2024.  Blood work reviewed with inflammatory markers being normal.  Last seen by podiatry on 07/08/2024 with continued debridement. Here today for follow up.  Ms. Babich has been doing well since her last office visit and continues to take ceftriaxone  and Flagyl  as prescribed noting constipation that was resolved with stool softeners and an enema.  PICC line functioning without problem and dressing is clean and dry.  No fevers, chills, or night sweats.  Attending wound care.  Working on increasing protein intake and controlling blood sugars.  Has questions about sources of carbohydrates.   Allergies  Allergen Reactions   Vancomycin  Anaphylaxis   Chlorhexidine  Gluconate Itching    Received CHG bath, began itching, required benadryl    Ativan  [Lorazepam ] Other (See Comments)    hallucinations   Cat Dander Other (See Comments)    Sneezing, watery eyes.   Other Itching    Dial  Soap   Tramadol  Other (See Comments)    Hallucinations   Codeine Rash      Outpatient Medications Prior to Visit  Medication Sig Dispense Refill   acetaminophen  (TYLENOL ) 325 MG tablet Take 2 tablets (650 mg total) by mouth every 4 (four) hours as needed for headache or mild pain.     amiodarone  (PACERONE ) 200 MG tablet Take 1 tablet (200 mg total) by mouth daily. 60 tablet 0   aspirin   81 MG chewable tablet Chew 81 mg by mouth daily.     cefTRIAXone  (ROCEPHIN ) IVPB Inject 2 g into the vein daily. Indication:  osteo First Dose: Yes Last Day of Therapy:  07/23/2024 Labs - Once weekly:  CBC/D and BMP, Labs - Once weekly: ESR and CRP Method of administration: IV Push Method of administration may be changed at the discretion of home infusion pharmacist based upon assessment of the patient and/or caregiver's ability to self-administer the medication ordered. 37 Units 0   clopidogrel  (PLAVIX ) 75 MG tablet Take 1 tablet (75 mg total) by mouth daily. 90 tablet 3   Continuous Glucose Sensor (DEXCOM G7 SENSOR) MISC Change sensor every 10 days as directed 3 each 12   Evolocumab  (REPATHA  SURECLICK) 140 MG/ML SOAJ Inject 140 mg into the skin every 14 (fourteen) days. 2 mL 1   FARXIGA  10 MG TABS tablet Take 1 tablet by mouth once daily 90 tablet 0   furosemide  (LASIX ) 20 MG tablet Take 1 tablet (20 mg total) by mouth daily. (Patient taking differently: Take 20 mg by mouth every other day.) 90 tablet 3   HUMALOG  KWIKPEN 100 UNIT/ML KwikPen Inject 1-20 Units into the skin at bedtime. CBG 70 - 120: 0 units CBG 121 - 150: 3 units CBG 151 - 200: 4 units CBG 201 - 250: 7 units CBG 251 - 300: 11 units CBG 301 - 350: 15 units CBG 351 - 400: 20 units CBG >  400: call MD and obtain STAT lab verification 30 mL 2   insulin  glargine, 1 Unit Dial , (TOUJEO  SOLOSTAR) 300 UNIT/ML Solostar Pen Inject 63 Units into the skin daily. 6 mL 1   Insulin  Pen Needle (INSUPEN PEN NEEDLES) 32G X 4 MM MISC Use as directed once daily with insulin . 100 each 1   metFORMIN  (GLUCOPHAGE ) 500 MG tablet TAKE 1 TABLET BY MOUTH TWICE DAILY WITH A MEAL 180 tablet 0   metoprolol  succinate (TOPROL -XL) 25 MG 24 hr tablet TAKE 1 TABLET BY MOUTH IN THE MORNING, AT NOON AND AT BEDTIME 270 tablet 0   metroNIDAZOLE  (FLAGYL ) 500 MG tablet Take 1 tablet (500 mg total) by mouth every 12 (twelve) hours. 72 tablet 0   nitroGLYCERIN  (NITROSTAT ) 0.4  MG SL tablet Place 1 tablet (0.4 mg total) under the tongue every 5 (five) minutes as needed for chest pain. 25 tablet 6   nystatin  (MYCOSTATIN /NYSTOP ) powder Apply 1 Application topically daily as needed (rash/yeast). 15 g 2   oxyCODONE  (ROXICODONE ) 5 MG immediate release tablet Take 1 tablet (5 mg total) by mouth every 6 (six) hours as needed for up to 10 doses for severe pain (pain score 7-10). 10 tablet 0   rosuvastatin  (CRESTOR ) 20 MG tablet Take 1 tablet (20 mg total) by mouth daily. 90 tablet 2   valsartan  (DIOVAN ) 80 MG tablet Take 1 tablet (80 mg total) by mouth 2 (two) times daily. 180 tablet 2   Vitamin D , Ergocalciferol , (DRISDOL ) 1.25 MG (50000 UNIT) CAPS capsule TAKE 1 CAPSULE BY MOUTH EVERY SATURDAY 12 capsule 0   No facility-administered medications prior to visit.     Past Medical History:  Diagnosis Date   Abnormal EKG 10/23/2021   Abnormal mammogram 08/09/2023   Abnormal mammogram of left breast 09/04/2021   Abnormal stress test 10/23/2021   Abscess of left elbow 02/18/2024   Absolute anemia 09/20/2022   Acquired varus deformity of foot, left    Acute chest pain 05/08/2016   Acute hemorrhagic cystitis 08/09/2023   AICD (automatic cardioverter/defibrillator) present    St Jude/Abbott device   AKI (acute kidney injury) (HCC) 10/23/2021   Amputation of right great toe (HCC) 09/20/2022   Angina pectoris (HCC) 09/11/2017   Anxiety 10/23/2021   B12 deficiency 09/20/2022   Bacteremia 11/22/2021   Breast wound, right, subsequent encounter 09/04/2021   Burping 05/08/2016   Cancer (HCC)    cervical - hysterectomy   Cellulitis of right breast 10/23/2021   Cellulitis, wound, post-operative 10/09/2021   Chronic diastolic heart failure (HCC) 11/06/2016   Chronic osteomyelitis of left foot (HCC) 10/16/2023   Colon cancer screening 09/20/2022   Coronary artery disease involving native coronary artery of native heart with angina pectoris (HCC) 09/12/2015   Overview:  PCI and  stent of RCA 2009, last cath 2012 with medical therapy  CABG May 2017   Demand ischemia [facility])    Diabetes mellitus due to underlying condition with hyperglycemia, with long-term current use of insulin  (HCC) 09/20/2022   Diabetic foot infection (HCC) 11/21/2021   Diabetic foot ulcer (HCC) 02/16/2020   Emphysema lung (HCC) 09/11/2017   patient not aware of this dx   Encounter for preoperative assessment 10/16/2023   Essential hypertension 09/12/2015   Gangrene (HCC) 10/23/2021   GERD (gastroesophageal reflux disease) 05/08/2016   Hematoma 10/23/2021   Hiatal hernia 10/23/2021   History of methicillin resistant staphylococcus aureus (MRSA)    Hyperglycemia 10/23/2021   Hyperglycemia due to type 2 diabetes mellitus (HCC) 10/23/2021  Hyperkalemia    Hyperlipidemia 09/12/2015   Hyponatremia 10/23/2021   ICD (implantable cardioverter-defibrillator) in place 05/16/2021   Infection of great toe 10/23/2021   Intertrigo 09/20/2022   Lactic acidosis 10/23/2021   Leukocytosis 10/23/2021   Mediastinitis 06/28/2016   Morbid obesity (HCC) 05/08/2016   Myocardial infarction [facility])    Needs flu shot 09/20/2022   NSTEMI (non-ST elevated myocardial infarction) (HCC) 05/22/2020   Obesity (BMI 30-39.9) 10/23/2021   Partial nontraumatic amputation of left foot (HCC) 09/20/2022   Perineal abscess 10/23/2021   Peripheral vascular disease (HCC)    S/P CABG (coronary artery bypass graft) 06/03/2016   Overview:  The patient underwent sternal reconstruction on 06/21/16 with pec flaps for mediastinitis from a prior CABG in May 2017. On admission, she was critically ill from sepsis and had altered mental status. She was last seen in clinic on 08/09/16 at which time she was doing well.   Severe sepsis (HCC) 06/03/2016   Sinus tachycardia 05/08/2016   Tobacco use disorder 04/20/2016   Overview:  Quit in May 2017.   Type 2 diabetes mellitus with foot ulcer (HCC) 05/08/2016   Uncontrolled type 2 diabetes mellitus  with hyperglycemia (HCC) 02/18/2024   Unstable angina (HCC) 05/22/2020   Ventricular tachycardia (HCC) 12/17/2020   Vitamin D  deficiency 09/20/2022   Wound, surgical, infected 06/06/2016   Overview:  sternal     Past Surgical History:  Procedure Laterality Date   ABDOMINAL AORTOGRAM W/LOWER EXTREMITY N/A 10/11/2021   Procedure: ABDOMINAL AORTOGRAM W/LOWER EXTREMITY;  Surgeon: Gretta Lonni PARAS, MD;  [facility]: [facility];  Service: Cardiovascular;  Laterality: N/A;   AMPUTATION TOE Right 02/20/2020   Procedure: AMPUTATION RIGHT GREAT  TOE;  Surgeon: Tobie Franky SQUIBB, DPM;  Location: [facility] OR;  Service: Podiatry;  Laterality: Right;   BLADDER SURGERY     BONE BIOPSY Left 02/01/2022   Procedure: BONE BIOPSY LEFT FOOT, IRRIGATION AND DEBRIDEMENT;  Surgeon: Burt Fus, DPM;  Location: [facility] OR;  Service: Podiatry;  Laterality: Left;   BONE BIOPSY Left 10/31/2023   Procedure: BONE BIOPSY;  Surgeon: Janit Thresa HERO, DPM;  Location: [facility] ORS;  Service: Orthopedics/Podiatry;  Laterality: Left;   BUBBLE STUDY  11/27/2021   Procedure: BUBBLE STUDY;  Surgeon: Okey Vina GAILS, MD;  Location: [facility] ENDOSCOPY;  Service: Cardiovascular;;   CARDIAC CATHETERIZATION     CORONARY ARTERY BYPASS GRAFT     CORONARY STENT INTERVENTION N/A 05/25/2020   Procedure: CORONARY STENT INTERVENTION;  Surgeon: Darron Deatrice LABOR, MD;  [facility]: [facility];  Service: Cardiovascular;  Laterality: N/A;   CORONARY STENT INTERVENTION N/A 06/22/2020   Procedure: CORONARY STENT INTERVENTION;  Surgeon: Anner Alm ORN, MD;  Location: [facility] INVASIVE CV LAB;  Service: Cardiovascular;  Laterality: N/A;   CORONARY ULTRASOUND/IVUS N/A 06/22/2020   Procedure: Intravascular Ultrasound/IVUS;  Surgeon: Anner Alm ORN, MD;  Location: [facility] INVASIVE CV LAB;  Service: Cardiovascular;  Laterality: N/A;   ICD IMPLANT N/A 12/19/2020   Procedure: ICD IMPLANT;  Surgeon: Waddell Danelle ORN, MD;  Location: [facility] INVASIVE CV LAB;  Service: Cardiovascular;   Laterality: N/A;   INCISION AND DRAINAGE Right 02/19/2020   Procedure: INCISION AND DRAINAGE;  Surgeon: Gershon Donnice SAUNDERS, DPM;  Location: [facility];  Service: Podiatry;  Laterality: Right;  Block done by surgeon   INCISION AND DRAINAGE OF WOUND Left 10/31/2023   Procedure: IRRIGATION AND DEBRIDEMENT WOUND;  Surgeon: Janit Thresa HERO, DPM;  Location: [facility] ORS;  Service: Orthopedics/Podiatry;  Laterality: Left;   IRRIGATION  AND DEBRIDEMENT ABSCESS Right 06/12/2024   Procedure: IRRIGATION AND DEBRIDEMENT ABSCESS;  Surgeon: Malvin Marsa FALCON, DPM;  [facility]: [facility];  Service: Orthopedics/Podiatry;  Laterality: Right;  I&D R heel, possible bone biopsy   IRRIGATION AND DEBRIDEMENT FOOT Left 10/10/2021   Procedure: IRRIGATION AND DEBRIDEMENT FOOT;  Surgeon: Burt Fus, DPM;  Location: [facility];  Service: Podiatry;  Laterality: Left;   IRRIGATION AND DEBRIDEMENT FOOT Left 11/23/2021   Procedure: IRRIGATION AND DEBRIDEMENT FOOT;  Surgeon: Joya Stabs, MD;  Location: [facility];  Service: Podiatry;  Laterality: Left;  will do local block   LEFT HEART CATH AND CORS/GRAFTS ANGIOGRAPHY N/A 05/23/2020   Procedure: LEFT HEART CATH AND CORS/GRAFTS ANGIOGRAPHY;  Surgeon: Mady Bruckner, MD;  [facility];  Service: Cardiovascular;  Laterality: N/A;   LEFT HEART CATH AND CORS/GRAFTS ANGIOGRAPHY N/A 12/19/2020   Procedure: LEFT HEART CATH AND CORS/GRAFTS ANGIOGRAPHY;  Surgeon: Verlin Bruckner BIRCH, MD;  [facility]: [facility];  Service: Cardiovascular;  Laterality: N/A;   LEFT HEART CATH AND CORS/GRAFTS ANGIOGRAPHY N/A 07/21/2021   Procedure: LEFT HEART CATH AND CORS/GRAFTS ANGIOGRAPHY;  Surgeon: Mady Bruckner, MD;  [facility];  Service: Cardiovascular;  Laterality: N/A;   METATARSAL HEAD EXCISION Right 05/02/2020   Procedure: FIRST METATARSAL HEAD RESECTION; RIGHT FOOT WOUND CLOSURE;  Surgeon: Burt Fus, DPM;  Location: [facility];  Service: Podiatry;   Laterality: Right;  MAC W/LOCAL   PERIPHERAL VASCULAR BALLOON ANGIOPLASTY Left 10/11/2021   Procedure: PERIPHERAL VASCULAR BALLOON ANGIOPLASTY;  Surgeon: Gretta Bruckner PARAS, MD;  [facility];  Service: Cardiovascular;  Laterality: Left;  Anterior Tibial Artery   TEE WITHOUT CARDIOVERSION N/A 11/27/2021   Procedure: TRANSESOPHAGEAL ECHOCARDIOGRAM (TEE);  Surgeon: Okey Vina GAILS, MD;  Location: [facility] ENDOSCOPY;  Service: Cardiovascular;  Laterality: N/A;   TIBIALIS TENDON TRANSFER / REPAIR Left 12/06/2023   Procedure: TIBIALIS TENDON TRANSFER;  Surgeon: Janit Thresa HERO, DPM;  Location: [facility] ORS;  Service: Orthopedics/Podiatry;  Laterality: Left;   TRANSMETATARSAL AMPUTATION Left 11/23/2021   Procedure: TRANSMETATARSAL AMPUTATION;  Surgeon: Joya Stabs, MD;  Location: [facility] OR;  Service: Podiatry;  Laterality: Left;   TUBAL LIGATION         Review of Systems  Constitutional:  Negative for chills, diaphoresis, fatigue and fever.  Respiratory:  Negative for cough, chest tightness, shortness of breath and wheezing.   Cardiovascular:  Negative for chest pain.  Gastrointestinal:  Positive for constipation. Negative for abdominal pain, diarrhea, nausea and vomiting.    Objective:   BP (!) 150/69   Pulse 75   Temp 97.7 F (36.5 C) (Oral)   LMP  (LMP Unknown)   SpO2 99%  Nursing note and vital signs reviewed.  Physical Exam Constitutional:      General: She is not in acute distress.    Appearance: She is well-developed.     Comments: Seated in the wheelchair; pleasant  Cardiovascular:     Rate and Rhythm: Normal rate and regular rhythm.     Heart sounds: Normal heart sounds.     Comments: PICC line in right upper extremity; dressing clean and dry. Pulmonary:     Effort: Pulmonary effort is normal.     Breath sounds: Normal breath sounds.  Skin:    General: Skin is warm and dry.  Neurological:     Mental Status: She is alert.         06/18/2024   12:41 PM  03/13/2024    9:53  AM 02/18/2024    3:38 PM 10/16/2023    2:11 PM 08/09/2023   10:50 AM  Depression screen PHQ 2/9  Decreased Interest 0 0 0 0 0  Down, Depressed, Hopeless 1 0 0 0 0  PHQ - 2 Score 1 0 0 0 0  Altered sleeping  0  1 0  Tired, decreased energy  0  1 0  Change in appetite  0  0 0  Feeling bad or failure about yourself   0  0 0  Trouble concentrating  0  0 0  Moving slowly or fidgety/restless  0  0 0  Suicidal thoughts  0  0 0  PHQ-9 Score  0  2 0  Difficult doing work/chores  Not difficult at all  Not difficult at all Not difficult at all     Assessment & Plan:    Patient Active Problem List   Diagnosis Date Noted   Cellulitis of right foot 06/15/2024   Acute osteomyelitis of right calcaneus (HCC) 06/15/2024   Abscess of bursa of right foot 06/12/2024   Abscess of right foot 06/10/2024   Right foot infection 06/10/2024   Acquired varus deformity of foot, left    History of methicillin resistant staphylococcus aureus (MRSA)    Hyperkalemia    Uncontrolled type 2 diabetes mellitus with hyperglycemia (HCC) 02/18/2024   Abscess of left elbow 02/18/2024   Chronic osteomyelitis of left foot (HCC) 10/16/2023   Encounter for preoperative assessment 10/16/2023   Abnormal mammogram 08/09/2023   Acute hemorrhagic cystitis 08/09/2023   Partial nontraumatic amputation of left foot (HCC) 09/20/2022   Diabetes mellitus due to underlying condition with hyperglycemia, with long-term current use of insulin  (HCC) 09/20/2022   Vitamin D  deficiency 09/20/2022   Amputation of right great toe (HCC) 09/20/2022   Absolute anemia 09/20/2022   B12 deficiency 09/20/2022   Needs flu shot 09/20/2022   Colon cancer screening 09/20/2022   Intertrigo 09/20/2022   AICD (automatic cardioverter/defibrillator) present 03/02/2022   Myocardial infarction (HCC) 03/02/2022   Cancer (HCC) 02/28/2022   Bacteremia 11/22/2021   Diabetic foot infection (HCC) 11/21/2021   Abnormal stress test  10/23/2021   Abnormal EKG 10/23/2021   AKI (acute kidney injury) (HCC) 10/23/2021   Anxiety 10/23/2021   Cellulitis of right breast 10/23/2021   Gangrene (HCC) 10/23/2021   Hematoma 10/23/2021   Hiatal hernia 10/23/2021   Hyperglycemia 10/23/2021   Hyperglycemia due to type 2 diabetes mellitus (HCC) 10/23/2021   Hyponatremia 10/23/2021   Infection of great toe 10/23/2021   Lactic acidosis 10/23/2021   Leukocytosis 10/23/2021   Obesity (BMI 30-39.9) 10/23/2021   Perineal abscess 10/23/2021   Cellulitis, wound, post-operative 10/09/2021   Abnormal mammogram of left breast 09/04/2021   Breast wound, right, subsequent encounter 09/04/2021   Demand ischemia (HCC)    ICD (implantable cardioverter-defibrillator) in place 05/16/2021   Ventricular tachycardia 12/17/2020   Unstable angina (HCC) 05/22/2020   NSTEMI (non-ST elevated myocardial infarction) (HCC) 05/22/2020   Diabetic foot ulcer (HCC) 02/16/2020   Angina pectoris (HCC) 09/11/2017   Emphysema lung (HCC) 09/11/2017   Chronic diastolic heart failure (HCC) 11/06/2016   Mediastinitis 06/28/2016   Wound, surgical, infected 06/06/2016   S/P CABG (coronary artery bypass graft) 06/03/2016   Severe sepsis (HCC) 06/03/2016   GERD (gastroesophageal reflux disease) 05/08/2016   Morbid obesity (HCC) 05/08/2016   Type 2 diabetes mellitus with foot ulcer (HCC) 05/08/2016   Sinus tachycardia 05/08/2016   Acute chest pain 05/08/2016  Burping 05/08/2016   Peripheral vascular disease (HCC) 05/08/2016   Tobacco use disorder 04/20/2016   Coronary artery disease involving native coronary artery of native heart with angina pectoris (HCC) 09/12/2015   Essential hypertension 09/12/2015   Hyperlipidemia 09/12/2015     Problem List Items Addressed This Visit       Other   Cellulitis of right foot - Primary   Ms. Castonguay has completed approximately 6 weeks of ceftriaxone  and Flagyl  for cellulitis of the right foot with abscess status  postdebridement and question of possible calcaneal osteomyelitis.  Infection appears to be adequately controlled with inflammatory markers being normal and no evidence of other infection including drainage or odor.  Discussed plan of care to complete ceftriaxone  and metronidazole  on 07/23/2024 as prescribed and will ensure PICC line is removed at that time.  No additional antibiotics are necessary.  Advised to continue with wound care per podiatry/wound care providers.  Emphasize protein intake and optimize blood sugar control and may benefit from nutritional counseling.  Given diabetes and vascular disease at increased for complicated healing and infection. Follow-up with ID as needed.        I am having Santana Silver maintain her acetaminophen , aspirin , furosemide , nitroGLYCERIN , valsartan , rosuvastatin , nystatin , metFORMIN , Repatha  SureClick, Vitamin D  (Ergocalciferol ), Farxiga , amiodarone , metoprolol  succinate, Toujeo  SoloStar, Insupen Pen Needles, clopidogrel , oxyCODONE , cefTRIAXone , HumaLOG  KwikPen, metroNIDAZOLE , and Dexcom G7 Sensor.   Follow-up: As needed with ID     Cathlyn July, MSN, FNP-C Nurse Practitioner [facility] Main number: [phone]

## 2024-07-21 NOTE — Patient Instructions (Signed)
 Nice to see you.  Continue to take your medication daily as prescribed thorugh 07/23/24  Continue wound care and follow up with Dr. Janit and Wound Care Provider.  Continue to emphasize protein to aid in healing and watch out for carbohydrates!  Follow up with ID as needed.   Have a great day and stay safe!

## 2024-07-22 ENCOUNTER — Other Ambulatory Visit (HOSPITAL_BASED_OUTPATIENT_CLINIC_OR_DEPARTMENT_OTHER): Payer: Self-pay

## 2024-07-22 ENCOUNTER — Encounter: Payer: Self-pay | Admitting: Physician Assistant

## 2024-07-22 ENCOUNTER — Ambulatory Visit (INDEPENDENT_AMBULATORY_CARE_PROVIDER_SITE_OTHER): Admitting: Physician Assistant

## 2024-07-22 VITALS — BP 134/76 | HR 72 | Temp 97.1°F | Ht 67.0 in | Wt 216.4 lb

## 2024-07-22 DIAGNOSIS — L03115 Cellulitis of right lower limb: Secondary | ICD-10-CM | POA: Diagnosis not present

## 2024-07-22 DIAGNOSIS — Z95810 Presence of automatic (implantable) cardiac defibrillator: Secondary | ICD-10-CM | POA: Diagnosis not present

## 2024-07-22 DIAGNOSIS — E782 Mixed hyperlipidemia: Secondary | ICD-10-CM | POA: Diagnosis not present

## 2024-07-22 DIAGNOSIS — E559 Vitamin D deficiency, unspecified: Secondary | ICD-10-CM | POA: Diagnosis not present

## 2024-07-22 DIAGNOSIS — I25119 Atherosclerotic heart disease of native coronary artery with unspecified angina pectoris: Secondary | ICD-10-CM

## 2024-07-22 DIAGNOSIS — I10 Essential (primary) hypertension: Secondary | ICD-10-CM | POA: Diagnosis not present

## 2024-07-22 DIAGNOSIS — M86171 Other acute osteomyelitis, right ankle and foot: Secondary | ICD-10-CM | POA: Diagnosis not present

## 2024-07-22 DIAGNOSIS — Z794 Long term (current) use of insulin: Secondary | ICD-10-CM | POA: Diagnosis not present

## 2024-07-22 DIAGNOSIS — L304 Erythema intertrigo: Secondary | ICD-10-CM

## 2024-07-22 DIAGNOSIS — E1165 Type 2 diabetes mellitus with hyperglycemia: Secondary | ICD-10-CM

## 2024-07-22 DIAGNOSIS — I5032 Chronic diastolic (congestive) heart failure: Secondary | ICD-10-CM | POA: Diagnosis not present

## 2024-07-22 DIAGNOSIS — M86672 Other chronic osteomyelitis, left ankle and foot: Secondary | ICD-10-CM | POA: Diagnosis not present

## 2024-07-22 DIAGNOSIS — S98111A Complete traumatic amputation of right great toe, initial encounter: Secondary | ICD-10-CM

## 2024-07-22 DIAGNOSIS — Z89432 Acquired absence of left foot: Secondary | ICD-10-CM

## 2024-07-22 MED ORDER — KETOCONAZOLE 2 % EX CREA
1.0000 | TOPICAL_CREAM | Freq: Every day | CUTANEOUS | 1 refills | Status: AC
  Filled 2024-07-22: qty 30, 30d supply, fill #0

## 2024-07-22 MED ORDER — REPATHA SURECLICK 140 MG/ML ~~LOC~~ SOAJ
140.0000 mg | SUBCUTANEOUS | 1 refills | Status: DC
  Filled 2024-07-22: qty 2, 28d supply, fill #0

## 2024-07-22 MED ORDER — TOUJEO SOLOSTAR 300 UNIT/ML ~~LOC~~ SOPN
63.0000 [IU] | PEN_INJECTOR | Freq: Every day | SUBCUTANEOUS | 1 refills | Status: DC
  Filled 2024-07-22: qty 6, 28d supply, fill #0
  Filled 2024-09-17: qty 6, 28d supply, fill #1

## 2024-07-22 MED ORDER — NYSTATIN 100000 UNIT/GM EX POWD
1.0000 | Freq: Every day | CUTANEOUS | 2 refills | Status: AC | PRN
  Filled 2024-07-22: qty 15, 7d supply, fill #0
  Filled 2024-09-30: qty 15, 7d supply, fill #1

## 2024-07-22 NOTE — Progress Notes (Signed)
 Subjective:  [name], female    DOB: May 21, 1961  Age: 63 y.o. MRN: [id number]  Chief Complaint  Patient presents with   Medical Management of Chronic Issues    Diabetes  Hypertension    Jasmine Richardson has a history of type 2 Diabetes Mellitus for more than 20 years. Current treatment includes toujeo  63 units qd.  humalog  sliding scale, farxiga  and glucophage    She denies hypoglycemic episodes. She states blood glucose levels at home fluctuate  Recent Hgb A1c done in 6/25 was 8.9 which was improved.   . She is following with [facility] every month, wound care at hospital weekly and has home health nurse coming out weekly --- this is for her having recent osteomyelitis and was in hospital about a month ago She has a PIC line - would like her labwork done through that next week when nurse comes out Pt has CGM but has not been wearing regularly  Pt with history of hyperlipidemia and CAD- currently also follows with cardiology.  Treatment includes , tricor , fish oil and crestor  20mg  qd- Once again she states that she has not been taking repatha  (says cardiology did not fill recently despite I have sent in this medication for her in past as well) - recommend to take all meds as directed  Pt with history of vit D disorder - taking weekly supplement and due for labwork  Pt with history of GERD - stable on omeprazole  20mg  qd -  Pt follows with cardiology on a regular basis.  She sees Dr Monetta for history of CAD, chronic diastolic heart failure,and history of vtach.  She is currently on amiodarone  therapy and has implantable defibrillator (which is monitored regularly).  She is also on  metoprolol , lasix , plavix ,  Pt with history of hypertension as well and also taking diovan  80mg  qd Has appt in 2 months     Lab Results  Component Value Date   HGBA1C 8.9 (H) 06/11/2024   HGBA1C 9.4 (H) 02/18/2024   HGBA1C 10.1 (H) 12/05/2023    Current Outpatient Medications on File  Prior to Visit  Medication Sig Dispense Refill   acetaminophen  (TYLENOL ) 325 MG tablet Take 2 tablets (650 mg total) by mouth every 4 (four) hours as needed for headache or mild pain.     amiodarone  (PACERONE ) 200 MG tablet Take 1 tablet (200 mg total) by mouth daily. 60 tablet 0   aspirin  81 MG chewable tablet Chew 81 mg by mouth daily.     cefTRIAXone  (ROCEPHIN ) IVPB Inject 2 g into the vein daily. Indication:  osteo First Dose: Yes Last Day of Therapy:  07/23/2024 Labs - Once weekly:  CBC/D and BMP, Labs - Once weekly: ESR and CRP Method of administration: IV Push Method of administration may be changed at the discretion of home infusion pharmacist based upon assessment of the patient and/or caregiver's ability to self-administer the medication ordered. 37 Units 0   clopidogrel  (PLAVIX ) 75 MG tablet Take 1 tablet (75 mg total) by mouth daily. 90 tablet 3   Continuous Glucose Sensor (DEXCOM G7 SENSOR) MISC Change sensor every 10 days as directed 3 each 12   FARXIGA  10 MG TABS tablet Take 1 tablet by mouth once daily 90 tablet 0   furosemide  (LASIX ) 20 MG tablet Take 1 tablet (20 mg total) by mouth daily. 90 tablet 3   HUMALOG  KWIKPEN 100 UNIT/ML KwikPen Inject 1-20 Units into the skin at bedtime. CBG 70 - 120: 0 units  CBG 121 - 150: 3 units CBG 151 - 200: 4 units CBG 201 - 250: 7 units CBG 251 - 300: 11 units CBG 301 - 350: 15 units CBG 351 - 400: 20 units CBG > 400: call MD and obtain STAT lab verification 30 mL 2   Insulin  Pen Needle (INSUPEN PEN NEEDLES) 32G X 4 MM MISC Use as directed once daily with insulin . 100 each 1   metFORMIN  (GLUCOPHAGE ) 500 MG tablet TAKE 1 TABLET BY MOUTH TWICE DAILY WITH A MEAL 180 tablet 0   metoprolol  succinate (TOPROL -XL) 25 MG 24 hr tablet TAKE 1 TABLET BY MOUTH IN THE MORNING, AT NOON AND AT BEDTIME 270 tablet 0   nitroGLYCERIN  (NITROSTAT ) 0.4 MG SL tablet Place 1 tablet (0.4 mg total) under the tongue every 5 (five) minutes as needed for chest pain. 25 tablet  6   rosuvastatin  (CRESTOR ) 20 MG tablet Take 1 tablet (20 mg total) by mouth daily. 90 tablet 2   SANTYL 250 UNIT/GM ointment Apply 1 Application topically daily.     valsartan  (DIOVAN ) 80 MG tablet Take 1 tablet (80 mg total) by mouth 2 (two) times daily. 180 tablet 2   Vitamin D , Ergocalciferol , (DRISDOL ) 1.25 MG (50000 UNIT) CAPS capsule TAKE 1 CAPSULE BY MOUTH EVERY SATURDAY 12 capsule 0   No current facility-administered medications on file prior to visit.   Past Medical History:  Diagnosis Date   Abnormal EKG 10/23/2021   Abnormal mammogram 08/09/2023   Abnormal mammogram of left breast 09/04/2021   Abnormal stress test 10/23/2021   Abscess of left elbow 02/18/2024   Absolute anemia 09/20/2022   Acquired varus deformity of foot, left    Acute chest pain 05/08/2016   Acute hemorrhagic cystitis 08/09/2023   AICD (automatic cardioverter/defibrillator) present    St Jude/Abbott device   AKI (acute kidney injury) (HCC) 10/23/2021   Amputation of right great toe (HCC) 09/20/2022   Angina pectoris (HCC) 09/11/2017   Anxiety 10/23/2021   B12 deficiency 09/20/2022   Bacteremia 11/22/2021   Breast wound, right, subsequent encounter 09/04/2021   Burping 05/08/2016   Cancer (HCC)    cervical - hysterectomy   Cellulitis of right breast 10/23/2021   Cellulitis, wound, post-operative 10/09/2021   Chronic diastolic heart failure (HCC) 11/06/2016   Chronic osteomyelitis of left foot (HCC) 10/16/2023   Colon cancer screening 09/20/2022   Coronary artery disease involving native coronary artery of native heart with angina pectoris (HCC) 09/12/2015   Overview:  PCI and stent of RCA 2009, last cath 2012 with medical therapy  CABG May 2017   Demand ischemia [facility])    Diabetes mellitus due to underlying condition with hyperglycemia, with long-term current use of insulin  (HCC) 09/20/2022   Diabetic foot infection (HCC) 11/21/2021   Diabetic foot ulcer (HCC) 02/16/2020   Emphysema lung (HCC)  09/11/2017   patient not aware of this dx   Encounter for preoperative assessment 10/16/2023   Essential hypertension 09/12/2015   Gangrene (HCC) 10/23/2021   GERD (gastroesophageal reflux disease) 05/08/2016   Hematoma 10/23/2021   Hiatal hernia 10/23/2021   History of methicillin resistant staphylococcus aureus (MRSA)    Hyperglycemia 10/23/2021   Hyperglycemia due to type 2 diabetes mellitus (HCC) 10/23/2021   Hyperkalemia    Hyperlipidemia 09/12/2015   Hyponatremia 10/23/2021   ICD (implantable cardioverter-defibrillator) in place 05/16/2021   Infection of great toe 10/23/2021   Intertrigo 09/20/2022   Lactic acidosis 10/23/2021   Leukocytosis 10/23/2021   Mediastinitis 06/28/2016  Morbid obesity (HCC) 05/08/2016   Myocardial infarction [facility])    Needs flu shot 09/20/2022   NSTEMI (non-ST elevated myocardial infarction) (HCC) 05/22/2020   Obesity (BMI 30-39.9) 10/23/2021   Partial nontraumatic amputation of left foot (HCC) 09/20/2022   Perineal abscess 10/23/2021   Peripheral vascular disease (HCC)    S/P CABG (coronary artery bypass graft) 06/03/2016   Overview:  The patient underwent sternal reconstruction on 06/21/16 with pec flaps for mediastinitis from a prior CABG in May 2017. On admission, she was critically ill from sepsis and had altered mental status. She was last seen in clinic on 08/09/16 at which time she was doing well.   Severe sepsis (HCC) 06/03/2016   Sinus tachycardia 05/08/2016   Tobacco use disorder 04/20/2016   Overview:  Quit in May 2017.   Type 2 diabetes mellitus with foot ulcer (HCC) 05/08/2016   Uncontrolled type 2 diabetes mellitus with hyperglycemia (HCC) 02/18/2024   Unstable angina (HCC) 05/22/2020   Ventricular tachycardia (HCC) 12/17/2020   Vitamin D  deficiency 09/20/2022   Wound, surgical, infected 06/06/2016   Overview:  sternal   Past Surgical History:  Procedure Laterality Date   ABDOMINAL AORTOGRAM W/LOWER EXTREMITY N/A 10/11/2021    Procedure: ABDOMINAL AORTOGRAM W/LOWER EXTREMITY;  Surgeon: Gretta Lonni PARAS, MD;  [facility]: [facility];  Service: Cardiovascular;  Laterality: N/A;   AMPUTATION TOE Right 02/20/2020   Procedure: AMPUTATION RIGHT GREAT  TOE;  Surgeon: Tobie Franky SQUIBB, DPM;  Location: [facility] OR;  Service: Podiatry;  Laterality: Right;   BLADDER SURGERY     BONE BIOPSY Left 02/01/2022   Procedure: BONE BIOPSY LEFT FOOT, IRRIGATION AND DEBRIDEMENT;  Surgeon: Burt Fus, DPM;  Location: [facility] OR;  Service: Podiatry;  Laterality: Left;   BONE BIOPSY Left 10/31/2023   Procedure: BONE BIOPSY;  Surgeon: Janit Thresa HERO, DPM;  Location: [facility] ORS;  Service: Orthopedics/Podiatry;  Laterality: Left;   BUBBLE STUDY  11/27/2021   Procedure: BUBBLE STUDY;  Surgeon: Okey Vina GAILS, MD;  Location: [facility] ENDOSCOPY;  Service: Cardiovascular;;   CARDIAC CATHETERIZATION     CORONARY ARTERY BYPASS GRAFT     CORONARY STENT INTERVENTION N/A 05/25/2020   Procedure: CORONARY STENT INTERVENTION;  Surgeon: Darron Deatrice LABOR, MD;  [facility]: [facility];  Service: Cardiovascular;  Laterality: N/A;   CORONARY STENT INTERVENTION N/A 06/22/2020   Procedure: CORONARY STENT INTERVENTION;  Surgeon: Anner Alm ORN, MD;  Location: [facility] INVASIVE CV LAB;  Service: Cardiovascular;  Laterality: N/A;   CORONARY ULTRASOUND/IVUS N/A 06/22/2020   Procedure: Intravascular Ultrasound/IVUS;  Surgeon: Anner Alm ORN, MD;  Location: [facility] INVASIVE CV LAB;  Service: Cardiovascular;  Laterality: N/A;   ICD IMPLANT N/A 12/19/2020   Procedure: ICD IMPLANT;  Surgeon: Waddell Danelle ORN, MD;  Location: [facility] INVASIVE CV LAB;  Service: Cardiovascular;  Laterality: N/A;   INCISION AND DRAINAGE Right 02/19/2020   Procedure: INCISION AND DRAINAGE;  Surgeon: Gershon Donnice SAUNDERS, DPM;  Location: [facility] OR;  Service: Podiatry;  Laterality: Right;  Block done by surgeon   INCISION AND DRAINAGE OF WOUND Left 10/31/2023   Procedure: IRRIGATION AND DEBRIDEMENT WOUND;  Surgeon: Janit Thresa HERO,  DPM;  Location: [facility] ORS;  Service: Orthopedics/Podiatry;  Laterality: Left;   IRRIGATION AND DEBRIDEMENT ABSCESS Right 06/12/2024   Procedure: IRRIGATION AND DEBRIDEMENT ABSCESS;  Surgeon: Malvin Marsa FALCON, DPM;  Location: [facility];  Service: Orthopedics/Podiatry;  Laterality: Right;  I&D R heel, possible bone biopsy   IRRIGATION AND DEBRIDEMENT FOOT Left 10/10/2021   Procedure:  IRRIGATION AND DEBRIDEMENT FOOT;  Surgeon: Burt Fus, DPM;  Location: [facility];  Service: Podiatry;  Laterality: Left;   IRRIGATION AND DEBRIDEMENT FOOT Left 11/23/2021   Procedure: IRRIGATION AND DEBRIDEMENT FOOT;  Surgeon: Joya Stabs, MD;  Location: [facility];  Service: Podiatry;  Laterality: Left;  will do local block   LEFT HEART CATH AND CORS/GRAFTS ANGIOGRAPHY N/A 05/23/2020   Procedure: LEFT HEART CATH AND CORS/GRAFTS ANGIOGRAPHY;  Surgeon: Mady Bruckner, MD;  [facility];  Service: Cardiovascular;  Laterality: N/A;   LEFT HEART CATH AND CORS/GRAFTS ANGIOGRAPHY N/A 12/19/2020   Procedure: LEFT HEART CATH AND CORS/GRAFTS ANGIOGRAPHY;  Surgeon: Verlin Bruckner BIRCH, MD;  [facility]: [facility];  Service: Cardiovascular;  Laterality: N/A;   LEFT HEART CATH AND CORS/GRAFTS ANGIOGRAPHY N/A 07/21/2021   Procedure: LEFT HEART CATH AND CORS/GRAFTS ANGIOGRAPHY;  Surgeon: Mady Bruckner, MD;  [facility]: [facility];  Service: Cardiovascular;  Laterality: N/A;   METATARSAL HEAD EXCISION Right 05/02/2020   Procedure: FIRST METATARSAL HEAD RESECTION; RIGHT FOOT WOUND CLOSURE;  Surgeon: Burt Fus, DPM;  Location:  SURGERY CENTER;  Service: Podiatry;  Laterality: Right;  MAC W/LOCAL   PERIPHERAL VASCULAR BALLOON ANGIOPLASTY Left 10/11/2021   Procedure: PERIPHERAL VASCULAR BALLOON ANGIOPLASTY;  Surgeon: Gretta Bruckner PARAS, MD;  [facility]: [facility];  Service: Cardiovascular;  Laterality: Left;  Anterior Tibial Artery   TEE WITHOUT CARDIOVERSION N/A 11/27/2021   Procedure:  TRANSESOPHAGEAL ECHOCARDIOGRAM (TEE);  Surgeon: Okey Vina GAILS, MD;  Location: [facility] ENDOSCOPY;  Service: Cardiovascular;  Laterality: N/A;   TIBIALIS TENDON TRANSFER / REPAIR Left 12/06/2023   Procedure: TIBIALIS TENDON TRANSFER;  Surgeon: Janit Thresa HERO, DPM;  Location: [facility] ORS;  Service: Orthopedics/Podiatry;  Laterality: Left;   TRANSMETATARSAL AMPUTATION Left 11/23/2021   Procedure: TRANSMETATARSAL AMPUTATION;  Surgeon: Joya Stabs, MD;  Location: [facility] OR;  Service: Podiatry;  Laterality: Left;   TUBAL LIGATION      Family History  Problem Relation Age of Onset   Hypertension Mother    Hyperlipidemia Mother    Heart attack Father    Heart disease Father    Hypertension Father    Alzheimer's disease Father    Hypertension Brother    Hyperlipidemia Brother    Social History   Socioeconomic History   Marital status: Married    Spouse name: Not on file   Number of children: Not on file   Years of education: Not on file   Highest education level: Not on file  Occupational History   Not on file  Tobacco Use   Smoking status: Former    Current packs/day: 0.00    Types: Cigarettes    Quit date: 05/2016    Years since quitting: 8.1   Smokeless tobacco: Never  Vaping Use   Vaping status: Never Used  Substance and Sexual Activity   Alcohol use: Yes    Comment: rare   Drug use: No   Sexual activity: Yes    Birth control/protection: Surgical    Comment: Hysterectomy  Other Topics Concern   Not on file  Social History Narrative   Not on file   Social Drivers of Health   Financial Resource Strain: Low Risk  (10/16/2023)   Overall Financial Resource Strain (CARDIA)    Difficulty of Paying Living Expenses: Not hard at all  Food Insecurity: No Food Insecurity (06/18/2024)   Hunger Vital Sign    Worried About Running Out of Food in the Last Year: Never true  Ran Out of Food in the Last Year: Never true  Transportation Needs: No Transportation Needs (06/18/2024)   PRAPARE -  Administrator, Civil Service (Medical): No    Lack of Transportation (Non-Medical): No  Physical Activity: Inactive (10/16/2023)   Exercise Vital Sign    Days of Exercise per Week: 0 days    Minutes of Exercise per Session: 0 min  Stress: No Stress Concern Present (10/16/2023)   [employer] of Occupational Health - Occupational Stress Questionnaire    Feeling of Stress : Not at all  Social Connections: Socially Integrated (10/16/2023)   Social Connection and Isolation Panel    Frequency of Communication with Friends and Family: More than three times a week    Frequency of Social Gatherings with Friends and Family: More than three times a week    Attends Religious Services: More than 4 times per year    Active Member of [employer] or Organizations: Yes    Attends Engineer, structural: More than 4 times per year    Marital Status: Married   CONSTITUTIONAL: Negative for chills, fatigue, fever, unintentional weight gain and unintentional weight loss.  E/N/T: Negative for ear pain, nasal congestion and sore throat.  CARDIOVASCULAR: Negative for chest pain, dizziness, palpitations and pedal edema.  RESPIRATORY: Negative for recent cough and dyspnea.  GASTROINTESTINAL: Negative for abdominal pain, acid reflux symptoms, constipation, diarrhea, nausea and vomiting.  MSK: see HPI INTEGUMENTARY:see HPI NEUROLOGICAL: Negative for dizziness and headaches.  PSYCHIATRIC: Negative for sleep disturbance and to question depression screen.  Negative for depression, negative for anhedonia.       Objective:  PHYSICAL EXAM:   VS: BP 134/76   Pulse 72   Temp (!) 97.1 F (36.2 C)   Ht 5' 7 (1.702 m)   Wt 216 lb 6.4 oz (98.2 kg)   LMP  (LMP Unknown)   SpO2 98%   BMI 33.89 kg/m   GEN: Well nourished, well developed, in no acute distress   Cardiac: RRR; no murmurs, rubs, or gallops,no edema -  Respiratory:  normal respiratory rate and pattern with no distress - normal  breath sounds with no rales, rhonchi, wheezes or rubs GI: normal bowel sounds, no masses or tenderness MS: see picture below -  Skin: see picture below Neuro:  Alert and Oriented x 3,- CN II-Xii grossly intact Psych: euthymic mood, appropriate affect and demeanor   No visits with results within 1 Day(s) from this visit.  Latest known visit with results is:  No results displayed because visit has over 200 results.          Lab Results  Component Value Date   WBC 17.3 (H) 06/17/2024   HGB 10.6 (L) 06/17/2024   HCT 33.4 (L) 06/17/2024   PLT 496 (H) 06/17/2024   GLUCOSE 134 (H) 06/17/2024   CHOL 242 (H) 03/13/2024   TRIG 251 (H) 03/13/2024   HDL 59 03/13/2024   LDLDIRECT 91.4 05/22/2020   LDLCALC 138 (H) 03/13/2024   ALT 18 06/17/2024   AST 19 06/17/2024   NA 138 06/17/2024   K 3.9 06/17/2024   CL 102 06/17/2024   CREATININE 1.15 (H) 06/17/2024   BUN 15 06/17/2024   CO2 24 06/17/2024   TSH 3.630 03/13/2024   INR 1.1 07/20/2021   HGBA1C 8.9 (H) 06/11/2024   MICROALBUR 293.5 (H) 07/21/2021      Assessment & Plan:   Problem List Items Addressed This Visit  Cardiovascular and Mediastinum   Chronic diastolic heart failure (HCC)   Relevant Medications   nitroGLYCERIN  (NITROSTAT ) 0.4 MG SL tablet Continue meds Follow up with cardiology   Essential hypertension - Primary   Relevant Medications   nitroGLYCERIN  (NITROSTAT ) 0.4 MG SL tablet Continue all meds Follow with cardiology   Other Relevant Orders   CBC with Differential/Platelet   Comprehensive metabolic panel   TSH                            Endocrine   Diabetic foot ulcer (HCC)   Relevant Medications   insulin  degludec (TRESIBA  FLEXTOUCH) 100 UNIT/ML FlexTouch Pen Continue follow up with podiatry   Uncontrolled Diabetes mellitus due to underlying condition with hyperglycemia, with long-term current use of insulin  (HCC)   Relevant Medications   insulin  degludec (TRESIBA  FLEXTOUCH) 100  UNIT/ML FlexTouch Pen   Other Relevant Orders   CBC with Differential/Platelet   Comprehensive metabolic panel   TSH   Hemoglobin A1c    CAD involving native coronary artery of native heart with stable angina Continue meds Restart repatha  Follow up with cardiology as directed     Musculoskeletal and Integument      Chronic osteomyelitis of left foot (HCC)   Follow up with specialist as directed     Other   Hyperlipidemia   Relevant Medications   nitroGLYCERIN  (NITROSTAT ) 0.4 MG SL tablet   Other Relevant Orders   Lipid panel Continue meds Restart Repatha  Continue statin   ICD (implantable cardioverter-defibrillator) in place   Relevant Medications   nitroGLYCERIN  (NITROSTAT ) 0.4 MG SL tablet Follow with cardiology      Partial nontraumatic amputation of left foot (HCC)   Vitamin D  deficiency   Relevant Orders   VITAMIN D  25 Hydroxy (Vit-D Deficiency, Fractures)   Amputation of right great toe (HCC)      Intertrigo Refill nystatin  powder                                .  Meds ordered this encounter  Medications   Evolocumab  (REPATHA  SURECLICK) 140 MG/ML SOAJ    Sig: Inject 140 mg into the skin every 14 (fourteen) days.    Dispense:  2 mL    Refill:  1    Supervising Provider:   COX, KIRSTEN [[id number]]   insulin  glargine, 1 Unit Dial , (TOUJEO  SOLOSTAR) 300 UNIT/ML Solostar Pen    Sig: Inject 63 Units into the skin daily.    Dispense:  6 mL    Refill:  1    Supervising Provider:   COX, KIRSTEN [[id number]]   nystatin  (MYCOSTATIN /NYSTOP ) powder    Sig: Apply 1 Application topically daily as needed (rash/yeast).    Dispense:  15 g    Refill:  2    Supervising Provider:   COX, KIRSTEN [[id number]]   ketoconazole  (NIZORAL ) 2 % cream    Sig: Apply 1 Application topically daily.    Dispense:  30 g    Refill:  1    Supervising Provider:   SHERRE CLAPPER [phone]    Orders Placed This Encounter  Procedures   CBC with Differential/Platelet   Comprehensive  metabolic panel with GFR   TSH   Lipid panel   VITAMIN D  25 Hydroxy (Vit-D Deficiency, Fractures)   Hemoglobin A1c     Follow-up: Return in about 3 months (around 10/22/2024) for chronic fasting follow-up.  An After Visit Summary was printed and given to the patient.  CAMIE JONELLE NICHOLAUS DEVONNA [facility] [phone]

## 2024-07-23 DIAGNOSIS — L97413 Non-pressure chronic ulcer of right heel and midfoot with necrosis of muscle: Secondary | ICD-10-CM | POA: Diagnosis not present

## 2024-07-23 DIAGNOSIS — L89610 Pressure ulcer of right heel, unstageable: Secondary | ICD-10-CM | POA: Diagnosis not present

## 2024-07-23 DIAGNOSIS — M86171 Other acute osteomyelitis, right ankle and foot: Secondary | ICD-10-CM | POA: Diagnosis not present

## 2024-07-23 DIAGNOSIS — L03115 Cellulitis of right lower limb: Secondary | ICD-10-CM | POA: Diagnosis not present

## 2024-07-24 ENCOUNTER — Other Ambulatory Visit (HOSPITAL_BASED_OUTPATIENT_CLINIC_OR_DEPARTMENT_OTHER): Payer: Self-pay

## 2024-07-27 DIAGNOSIS — N390 Urinary tract infection, site not specified: Secondary | ICD-10-CM | POA: Diagnosis not present

## 2024-07-27 DIAGNOSIS — F419 Anxiety disorder, unspecified: Secondary | ICD-10-CM | POA: Diagnosis not present

## 2024-07-27 DIAGNOSIS — I2510 Atherosclerotic heart disease of native coronary artery without angina pectoris: Secondary | ICD-10-CM | POA: Diagnosis not present

## 2024-07-27 DIAGNOSIS — I110 Hypertensive heart disease with heart failure: Secondary | ICD-10-CM | POA: Diagnosis not present

## 2024-07-27 DIAGNOSIS — E559 Vitamin D deficiency, unspecified: Secondary | ICD-10-CM | POA: Diagnosis not present

## 2024-07-27 DIAGNOSIS — E1165 Type 2 diabetes mellitus with hyperglycemia: Secondary | ICD-10-CM | POA: Diagnosis not present

## 2024-07-27 DIAGNOSIS — E1140 Type 2 diabetes mellitus with diabetic neuropathy, unspecified: Secondary | ICD-10-CM | POA: Diagnosis not present

## 2024-07-27 DIAGNOSIS — J439 Emphysema, unspecified: Secondary | ICD-10-CM | POA: Diagnosis not present

## 2024-07-27 DIAGNOSIS — Z7984 Long term (current) use of oral hypoglycemic drugs: Secondary | ICD-10-CM | POA: Diagnosis not present

## 2024-07-27 DIAGNOSIS — L03115 Cellulitis of right lower limb: Secondary | ICD-10-CM | POA: Diagnosis not present

## 2024-07-27 DIAGNOSIS — E1151 Type 2 diabetes mellitus with diabetic peripheral angiopathy without gangrene: Secondary | ICD-10-CM | POA: Diagnosis not present

## 2024-07-27 DIAGNOSIS — E538 Deficiency of other specified B group vitamins: Secondary | ICD-10-CM | POA: Diagnosis not present

## 2024-07-27 DIAGNOSIS — S91341D Puncture wound with foreign body, right foot, subsequent encounter: Secondary | ICD-10-CM | POA: Diagnosis not present

## 2024-07-27 DIAGNOSIS — E1169 Type 2 diabetes mellitus with other specified complication: Secondary | ICD-10-CM | POA: Diagnosis not present

## 2024-07-27 DIAGNOSIS — B951 Streptococcus, group B, as the cause of diseases classified elsewhere: Secondary | ICD-10-CM | POA: Diagnosis not present

## 2024-07-27 DIAGNOSIS — D649 Anemia, unspecified: Secondary | ICD-10-CM | POA: Diagnosis not present

## 2024-07-27 DIAGNOSIS — I472 Ventricular tachycardia, unspecified: Secondary | ICD-10-CM | POA: Diagnosis not present

## 2024-07-27 DIAGNOSIS — M71071 Abscess of bursa, right ankle and foot: Secondary | ICD-10-CM | POA: Diagnosis not present

## 2024-07-27 DIAGNOSIS — L02611 Cutaneous abscess of right foot: Secondary | ICD-10-CM | POA: Diagnosis not present

## 2024-07-27 DIAGNOSIS — I5032 Chronic diastolic (congestive) heart failure: Secondary | ICD-10-CM | POA: Diagnosis not present

## 2024-07-27 DIAGNOSIS — I252 Old myocardial infarction: Secondary | ICD-10-CM | POA: Diagnosis not present

## 2024-07-27 DIAGNOSIS — M86171 Other acute osteomyelitis, right ankle and foot: Secondary | ICD-10-CM | POA: Diagnosis not present

## 2024-07-27 DIAGNOSIS — E785 Hyperlipidemia, unspecified: Secondary | ICD-10-CM | POA: Diagnosis not present

## 2024-07-27 DIAGNOSIS — M21172 Varus deformity, not elsewhere classified, left ankle: Secondary | ICD-10-CM | POA: Diagnosis not present

## 2024-07-30 DIAGNOSIS — E113312 Type 2 diabetes mellitus with moderate nonproliferative diabetic retinopathy with macular edema, left eye: Secondary | ICD-10-CM | POA: Diagnosis not present

## 2024-07-31 DIAGNOSIS — L97413 Non-pressure chronic ulcer of right heel and midfoot with necrosis of muscle: Secondary | ICD-10-CM | POA: Diagnosis not present

## 2024-07-31 DIAGNOSIS — L89610 Pressure ulcer of right heel, unstageable: Secondary | ICD-10-CM | POA: Diagnosis not present

## 2024-08-03 ENCOUNTER — Other Ambulatory Visit (HOSPITAL_BASED_OUTPATIENT_CLINIC_OR_DEPARTMENT_OTHER): Payer: Self-pay

## 2024-08-03 DIAGNOSIS — Z7984 Long term (current) use of oral hypoglycemic drugs: Secondary | ICD-10-CM | POA: Diagnosis not present

## 2024-08-03 DIAGNOSIS — M71071 Abscess of bursa, right ankle and foot: Secondary | ICD-10-CM | POA: Diagnosis not present

## 2024-08-03 DIAGNOSIS — I252 Old myocardial infarction: Secondary | ICD-10-CM | POA: Diagnosis not present

## 2024-08-03 DIAGNOSIS — F419 Anxiety disorder, unspecified: Secondary | ICD-10-CM | POA: Diagnosis not present

## 2024-08-03 DIAGNOSIS — I110 Hypertensive heart disease with heart failure: Secondary | ICD-10-CM | POA: Diagnosis not present

## 2024-08-03 DIAGNOSIS — M86171 Other acute osteomyelitis, right ankle and foot: Secondary | ICD-10-CM | POA: Diagnosis not present

## 2024-08-03 DIAGNOSIS — E559 Vitamin D deficiency, unspecified: Secondary | ICD-10-CM | POA: Diagnosis not present

## 2024-08-03 DIAGNOSIS — S91341D Puncture wound with foreign body, right foot, subsequent encounter: Secondary | ICD-10-CM | POA: Diagnosis not present

## 2024-08-03 DIAGNOSIS — E1165 Type 2 diabetes mellitus with hyperglycemia: Secondary | ICD-10-CM | POA: Diagnosis not present

## 2024-08-03 DIAGNOSIS — I2510 Atherosclerotic heart disease of native coronary artery without angina pectoris: Secondary | ICD-10-CM | POA: Diagnosis not present

## 2024-08-03 DIAGNOSIS — L03115 Cellulitis of right lower limb: Secondary | ICD-10-CM | POA: Diagnosis not present

## 2024-08-03 DIAGNOSIS — E1169 Type 2 diabetes mellitus with other specified complication: Secondary | ICD-10-CM | POA: Diagnosis not present

## 2024-08-03 DIAGNOSIS — M21172 Varus deformity, not elsewhere classified, left ankle: Secondary | ICD-10-CM | POA: Diagnosis not present

## 2024-08-03 DIAGNOSIS — E538 Deficiency of other specified B group vitamins: Secondary | ICD-10-CM | POA: Diagnosis not present

## 2024-08-03 DIAGNOSIS — B951 Streptococcus, group B, as the cause of diseases classified elsewhere: Secondary | ICD-10-CM | POA: Diagnosis not present

## 2024-08-03 DIAGNOSIS — E785 Hyperlipidemia, unspecified: Secondary | ICD-10-CM | POA: Diagnosis not present

## 2024-08-03 DIAGNOSIS — E1151 Type 2 diabetes mellitus with diabetic peripheral angiopathy without gangrene: Secondary | ICD-10-CM | POA: Diagnosis not present

## 2024-08-03 DIAGNOSIS — L02611 Cutaneous abscess of right foot: Secondary | ICD-10-CM | POA: Diagnosis not present

## 2024-08-03 DIAGNOSIS — J439 Emphysema, unspecified: Secondary | ICD-10-CM | POA: Diagnosis not present

## 2024-08-03 DIAGNOSIS — I472 Ventricular tachycardia, unspecified: Secondary | ICD-10-CM | POA: Diagnosis not present

## 2024-08-03 DIAGNOSIS — E1140 Type 2 diabetes mellitus with diabetic neuropathy, unspecified: Secondary | ICD-10-CM | POA: Diagnosis not present

## 2024-08-03 DIAGNOSIS — I5032 Chronic diastolic (congestive) heart failure: Secondary | ICD-10-CM | POA: Diagnosis not present

## 2024-08-03 DIAGNOSIS — D649 Anemia, unspecified: Secondary | ICD-10-CM | POA: Diagnosis not present

## 2024-08-03 DIAGNOSIS — N390 Urinary tract infection, site not specified: Secondary | ICD-10-CM | POA: Diagnosis not present

## 2024-08-05 ENCOUNTER — Telehealth: Payer: Self-pay | Admitting: Physician Assistant

## 2024-08-05 NOTE — Telephone Encounter (Signed)
 [facility] [phone]

## 2024-08-06 DIAGNOSIS — L97413 Non-pressure chronic ulcer of right heel and midfoot with necrosis of muscle: Secondary | ICD-10-CM | POA: Diagnosis not present

## 2024-08-06 DIAGNOSIS — L89610 Pressure ulcer of right heel, unstageable: Secondary | ICD-10-CM | POA: Diagnosis not present

## 2024-08-09 DIAGNOSIS — F419 Anxiety disorder, unspecified: Secondary | ICD-10-CM | POA: Diagnosis not present

## 2024-08-09 DIAGNOSIS — L03115 Cellulitis of right lower limb: Secondary | ICD-10-CM | POA: Diagnosis not present

## 2024-08-09 DIAGNOSIS — Z7984 Long term (current) use of oral hypoglycemic drugs: Secondary | ICD-10-CM | POA: Diagnosis not present

## 2024-08-09 DIAGNOSIS — E538 Deficiency of other specified B group vitamins: Secondary | ICD-10-CM | POA: Diagnosis not present

## 2024-08-09 DIAGNOSIS — I472 Ventricular tachycardia, unspecified: Secondary | ICD-10-CM | POA: Diagnosis not present

## 2024-08-09 DIAGNOSIS — E785 Hyperlipidemia, unspecified: Secondary | ICD-10-CM | POA: Diagnosis not present

## 2024-08-09 DIAGNOSIS — M21172 Varus deformity, not elsewhere classified, left ankle: Secondary | ICD-10-CM | POA: Diagnosis not present

## 2024-08-09 DIAGNOSIS — I5032 Chronic diastolic (congestive) heart failure: Secondary | ICD-10-CM | POA: Diagnosis not present

## 2024-08-09 DIAGNOSIS — I2510 Atherosclerotic heart disease of native coronary artery without angina pectoris: Secondary | ICD-10-CM | POA: Diagnosis not present

## 2024-08-09 DIAGNOSIS — B951 Streptococcus, group B, as the cause of diseases classified elsewhere: Secondary | ICD-10-CM | POA: Diagnosis not present

## 2024-08-09 DIAGNOSIS — N390 Urinary tract infection, site not specified: Secondary | ICD-10-CM | POA: Diagnosis not present

## 2024-08-09 DIAGNOSIS — J439 Emphysema, unspecified: Secondary | ICD-10-CM | POA: Diagnosis not present

## 2024-08-09 DIAGNOSIS — E559 Vitamin D deficiency, unspecified: Secondary | ICD-10-CM | POA: Diagnosis not present

## 2024-08-09 DIAGNOSIS — E1165 Type 2 diabetes mellitus with hyperglycemia: Secondary | ICD-10-CM | POA: Diagnosis not present

## 2024-08-09 DIAGNOSIS — E1140 Type 2 diabetes mellitus with diabetic neuropathy, unspecified: Secondary | ICD-10-CM | POA: Diagnosis not present

## 2024-08-09 DIAGNOSIS — M71071 Abscess of bursa, right ankle and foot: Secondary | ICD-10-CM | POA: Diagnosis not present

## 2024-08-09 DIAGNOSIS — E1169 Type 2 diabetes mellitus with other specified complication: Secondary | ICD-10-CM | POA: Diagnosis not present

## 2024-08-09 DIAGNOSIS — E1151 Type 2 diabetes mellitus with diabetic peripheral angiopathy without gangrene: Secondary | ICD-10-CM | POA: Diagnosis not present

## 2024-08-09 DIAGNOSIS — L02611 Cutaneous abscess of right foot: Secondary | ICD-10-CM | POA: Diagnosis not present

## 2024-08-09 DIAGNOSIS — S91341D Puncture wound with foreign body, right foot, subsequent encounter: Secondary | ICD-10-CM | POA: Diagnosis not present

## 2024-08-09 DIAGNOSIS — D649 Anemia, unspecified: Secondary | ICD-10-CM | POA: Diagnosis not present

## 2024-08-09 DIAGNOSIS — I110 Hypertensive heart disease with heart failure: Secondary | ICD-10-CM | POA: Diagnosis not present

## 2024-08-09 DIAGNOSIS — M86171 Other acute osteomyelitis, right ankle and foot: Secondary | ICD-10-CM | POA: Diagnosis not present

## 2024-08-09 DIAGNOSIS — I252 Old myocardial infarction: Secondary | ICD-10-CM | POA: Diagnosis not present

## 2024-08-10 ENCOUNTER — Other Ambulatory Visit: Payer: Self-pay

## 2024-08-10 ENCOUNTER — Other Ambulatory Visit (HOSPITAL_BASED_OUTPATIENT_CLINIC_OR_DEPARTMENT_OTHER): Payer: Self-pay

## 2024-08-10 MED ORDER — AMIODARONE HCL 200 MG PO TABS
200.0000 mg | ORAL_TABLET | Freq: Every day | ORAL | 0 refills | Status: DC
  Filled 2024-08-10: qty 30, 30d supply, fill #0

## 2024-08-10 NOTE — Telephone Encounter (Signed)
 This is a medication cardiology needs to fill --- she did see Dr Madireddy in June and he should be able to fill Also it says once daily but asks for 60 pills - that needs to be clarified Please reach out to cardiology

## 2024-08-10 NOTE — Telephone Encounter (Signed)
 Patient called and stated that she need a refill of Amiodarone  and normally Dr. Monetta calls it in but he is out due to being sick and she has called his office twice and got voicemail. Please advise

## 2024-08-11 ENCOUNTER — Other Ambulatory Visit (HOSPITAL_BASED_OUTPATIENT_CLINIC_OR_DEPARTMENT_OTHER): Payer: Self-pay

## 2024-08-13 ENCOUNTER — Encounter: Payer: Self-pay | Admitting: Internal Medicine

## 2024-08-14 ENCOUNTER — Ambulatory Visit (INDEPENDENT_AMBULATORY_CARE_PROVIDER_SITE_OTHER): Payer: Self-pay

## 2024-08-14 DIAGNOSIS — I5032 Chronic diastolic (congestive) heart failure: Secondary | ICD-10-CM

## 2024-08-14 DIAGNOSIS — L89610 Pressure ulcer of right heel, unstageable: Secondary | ICD-10-CM | POA: Diagnosis not present

## 2024-08-14 DIAGNOSIS — L97413 Non-pressure chronic ulcer of right heel and midfoot with necrosis of muscle: Secondary | ICD-10-CM | POA: Diagnosis not present

## 2024-08-15 LAB — CUP PACEART REMOTE DEVICE CHECK
Battery Remaining Longevity: 74 mo
Battery Remaining Percentage: 67 %
Battery Voltage: 2.99 V
Brady Statistic AP VP Percent: 1 %
Brady Statistic AP VS Percent: 1.4 %
Brady Statistic AS VP Percent: 1 %
Brady Statistic AS VS Percent: 98 %
Brady Statistic RA Percent Paced: 1.4 %
Brady Statistic RV Percent Paced: 1 %
Date Time Interrogation Session: 20250829020054
HighPow Impedance: 63 Ohm
Implantable Lead Connection Status: 753985
Implantable Lead Connection Status: 753985
Implantable Lead Implant Date: 20220103
Implantable Lead Implant Date: 20220103
Implantable Lead Location: 753859
Implantable Lead Location: 753860
Implantable Pulse Generator Implant Date: 20220103
Lead Channel Impedance Value: 360 Ohm
Lead Channel Impedance Value: 400 Ohm
Lead Channel Pacing Threshold Amplitude: 0.75 V
Lead Channel Pacing Threshold Amplitude: 1 V
Lead Channel Pacing Threshold Pulse Width: 0.5 ms
Lead Channel Pacing Threshold Pulse Width: 0.5 ms
Lead Channel Sensing Intrinsic Amplitude: 12 mV
Lead Channel Sensing Intrinsic Amplitude: 4.7 mV
Lead Channel Setting Pacing Amplitude: 2 V
Lead Channel Setting Pacing Amplitude: 2.5 V
Lead Channel Setting Pacing Pulse Width: 0.5 ms
Lead Channel Setting Sensing Sensitivity: 0.5 mV
Pulse Gen Serial Number: 111037319

## 2024-08-17 ENCOUNTER — Ambulatory Visit: Payer: Self-pay | Admitting: Internal Medicine

## 2024-08-18 DIAGNOSIS — B951 Streptococcus, group B, as the cause of diseases classified elsewhere: Secondary | ICD-10-CM | POA: Diagnosis not present

## 2024-08-18 DIAGNOSIS — M86171 Other acute osteomyelitis, right ankle and foot: Secondary | ICD-10-CM | POA: Diagnosis not present

## 2024-08-18 DIAGNOSIS — E785 Hyperlipidemia, unspecified: Secondary | ICD-10-CM | POA: Diagnosis not present

## 2024-08-18 DIAGNOSIS — E1140 Type 2 diabetes mellitus with diabetic neuropathy, unspecified: Secondary | ICD-10-CM | POA: Diagnosis not present

## 2024-08-18 DIAGNOSIS — N390 Urinary tract infection, site not specified: Secondary | ICD-10-CM | POA: Diagnosis not present

## 2024-08-18 DIAGNOSIS — F419 Anxiety disorder, unspecified: Secondary | ICD-10-CM | POA: Diagnosis not present

## 2024-08-18 DIAGNOSIS — I472 Ventricular tachycardia, unspecified: Secondary | ICD-10-CM | POA: Diagnosis not present

## 2024-08-18 DIAGNOSIS — I2510 Atherosclerotic heart disease of native coronary artery without angina pectoris: Secondary | ICD-10-CM | POA: Diagnosis not present

## 2024-08-18 DIAGNOSIS — L03115 Cellulitis of right lower limb: Secondary | ICD-10-CM | POA: Diagnosis not present

## 2024-08-18 DIAGNOSIS — I5032 Chronic diastolic (congestive) heart failure: Secondary | ICD-10-CM | POA: Diagnosis not present

## 2024-08-18 DIAGNOSIS — I252 Old myocardial infarction: Secondary | ICD-10-CM | POA: Diagnosis not present

## 2024-08-18 DIAGNOSIS — L02611 Cutaneous abscess of right foot: Secondary | ICD-10-CM | POA: Diagnosis not present

## 2024-08-18 DIAGNOSIS — E1151 Type 2 diabetes mellitus with diabetic peripheral angiopathy without gangrene: Secondary | ICD-10-CM | POA: Diagnosis not present

## 2024-08-18 DIAGNOSIS — E1165 Type 2 diabetes mellitus with hyperglycemia: Secondary | ICD-10-CM | POA: Diagnosis not present

## 2024-08-18 DIAGNOSIS — E1169 Type 2 diabetes mellitus with other specified complication: Secondary | ICD-10-CM | POA: Diagnosis not present

## 2024-08-18 DIAGNOSIS — E559 Vitamin D deficiency, unspecified: Secondary | ICD-10-CM | POA: Diagnosis not present

## 2024-08-18 DIAGNOSIS — S91341D Puncture wound with foreign body, right foot, subsequent encounter: Secondary | ICD-10-CM | POA: Diagnosis not present

## 2024-08-18 DIAGNOSIS — M71071 Abscess of bursa, right ankle and foot: Secondary | ICD-10-CM | POA: Diagnosis not present

## 2024-08-18 DIAGNOSIS — Z7984 Long term (current) use of oral hypoglycemic drugs: Secondary | ICD-10-CM | POA: Diagnosis not present

## 2024-08-18 DIAGNOSIS — E538 Deficiency of other specified B group vitamins: Secondary | ICD-10-CM | POA: Diagnosis not present

## 2024-08-18 DIAGNOSIS — M21172 Varus deformity, not elsewhere classified, left ankle: Secondary | ICD-10-CM | POA: Diagnosis not present

## 2024-08-18 DIAGNOSIS — D649 Anemia, unspecified: Secondary | ICD-10-CM | POA: Diagnosis not present

## 2024-08-18 DIAGNOSIS — I110 Hypertensive heart disease with heart failure: Secondary | ICD-10-CM | POA: Diagnosis not present

## 2024-08-18 DIAGNOSIS — J439 Emphysema, unspecified: Secondary | ICD-10-CM | POA: Diagnosis not present

## 2024-08-18 NOTE — Progress Notes (Unsigned)
 Cardiology Office Note:    Date:  08/21/2024   ID:  Jasmine Richardson, DOB 1961-02-22, MRN [id number]  PCP:  Nicholaus Credit, PA-C  Cardiologist:  Redell Leiter, MD    Referring MD: Nicholaus Credit, PA-C    ASSESSMENT:    1. Coronary artery disease involving native coronary artery of native heart with angina pectoris (HCC)   2. S/P CABG (coronary artery bypass graft)   3. On amiodarone  therapy   4. Ventricular tachycardia (HCC)   5. ICD (implantable cardioverter-defibrillator) in place   6. Hypertensive heart disease with chronic diastolic congestive heart failure (HCC)   7. Mixed hyperlipidemia    PLAN:    In order of problems listed above:  Avigayil has done quite well with very complex CAD initial surgery redo for postoperative mediastinitis and then subsequent PCI and stent for ACS Continue her amiodarone  ICD followed in our practice Heart failure is stable compensated continue her treatment which includes valsartan  beta-blocker and SGLT2 inhibitor her loop diuretic in the past she did not tolerate MRA Continue her combined lipid-lowering therapy follow-up labs will be in her PCP office   Next appointment: 6 months   Medication Adjustments/Labs and Tests Ordered: Current medicines are reviewed at length with the patient today.  Concerns regarding medicines are outlined above.  Orders Placed This Encounter  Procedures   EKG 12-Lead   No orders of the defined types were placed in this encounter.   History of Present Illness:    Jasmine Richardson is a 63 y.o. female with a hx of very complex cardiac issues including CAD with CABG 2017 complicated by postoperative mediastinitis and reoperation at [facility] with a myocutaneous pectoralis flap closure ACS and PCI and stent native right coronary artery June 2021 with occlusion of vein graft to the same vessel subsequent complex PCI and stent with lithotripsy LAD February 2022 ventricular tachycardia suppressed with amiodarone  she has an  ICD type 2 diabetes hyperlipidemia hypertensive heart disease with chronic diastolic heart failure and peripheral arterial disease last seen by me 02/27/2023.  Compliance with diet, lifestyle and medications: Yes  Unfortunately she had a wound on her heel right lower extremity from where she stepped on glass and did not realize because of her diabetic neuropathy she has had local surgery has a wound VAC to and is healing no signs of infection. Except for this she really has done quite well she is not having angina edema shortness of breath palpitation or syncope.  Her husband says that at times she wheezes just a bit He tolerates his lipid-lowering therapy and the last time her lipids were checked she was not taking Repatha  she been back on for about a month and should have follow-up labs in her PCP office. She continues on long-term dual antiplatelet therapy and amiodarone .  Stable ICD followed in our practice. Past Medical History:  Diagnosis Date   Abnormal EKG 10/23/2021   Abnormal mammogram 08/09/2023   Abnormal mammogram of left breast 09/04/2021   Abnormal stress test 10/23/2021   Abscess of bursa of right foot 06/12/2024   Abscess of left elbow 02/18/2024   Abscess of right foot 06/10/2024   Absolute anemia 09/20/2022   Acquired varus deformity of foot, left    Acute chest pain 05/08/2016   Acute hemorrhagic cystitis 08/09/2023   Acute osteomyelitis of right calcaneus (HCC) 06/15/2024   AICD (automatic cardioverter/defibrillator) present    St Jude/Abbott device   AKI (acute kidney injury) (HCC) 10/23/2021   Amputation of  right great toe (HCC) 09/20/2022   Angina pectoris (HCC) 09/11/2017   Anxiety 10/23/2021   B12 deficiency 09/20/2022   Bacteremia 11/22/2021   Breast wound, right, subsequent encounter 09/04/2021   Burping 05/08/2016   Cancer (HCC)    cervical - hysterectomy   Cellulitis of right breast 10/23/2021   Cellulitis of right foot 06/15/2024   Cellulitis, wound,  post-operative 10/09/2021   Chronic diastolic heart failure (HCC) 11/06/2016   Chronic osteomyelitis of left foot (HCC) 10/16/2023   Colon cancer screening 09/20/2022   Coronary artery disease involving native coronary artery of native heart with angina pectoris (HCC) 09/12/2015   Overview:  PCI and stent of RCA 2009, last cath 2012 with medical therapy  CABG May 2017   Demand ischemia [facility])    Diabetes mellitus due to underlying condition with hyperglycemia, with long-term current use of insulin  (HCC) 09/20/2022   Diabetic foot infection (HCC) 11/21/2021   Diabetic foot ulcer (HCC) 02/16/2020   Emphysema lung (HCC) 09/11/2017   patient not aware of this dx   Encounter for preoperative assessment 10/16/2023   Essential hypertension 09/12/2015   Gangrene (HCC) 10/23/2021   GERD (gastroesophageal reflux disease) 05/08/2016   Hematoma 10/23/2021   Hiatal hernia 10/23/2021   History of methicillin resistant staphylococcus aureus (MRSA)    Hyperglycemia 10/23/2021   Hyperglycemia due to type 2 diabetes mellitus (HCC) 10/23/2021   Hyperkalemia    Hyperlipidemia 09/12/2015   Hyponatremia 10/23/2021   ICD (implantable cardioverter-defibrillator) in place 05/16/2021   Infection of great toe 10/23/2021   Intertrigo 09/20/2022   Lactic acidosis 10/23/2021   Leukocytosis 10/23/2021   Mediastinitis 06/28/2016   Morbid obesity (HCC) 05/08/2016   Myocardial infarction (HCC)    Needs flu shot 09/20/2022   NSTEMI (non-ST elevated myocardial infarction) (HCC) 05/22/2020   Obesity (BMI 30-39.9) 10/23/2021   Partial nontraumatic amputation of left foot (HCC) 09/20/2022   Perineal abscess 10/23/2021   Peripheral vascular disease (HCC)    Right foot infection 06/10/2024   S/P CABG (coronary artery bypass graft) 06/03/2016   Overview:  The patient underwent sternal reconstruction on 06/21/16 with pec flaps for mediastinitis from a prior CABG in May 2017. On admission, she was critically ill from  sepsis and had altered mental status. She was last seen in clinic on 08/09/16 at which time she was doing well.   Severe sepsis (HCC) 06/03/2016   Sinus tachycardia 05/08/2016   Tobacco use disorder 04/20/2016   Overview:  Quit in May 2017.   Type 2 diabetes mellitus with foot ulcer (HCC) 05/08/2016   Uncontrolled type 2 diabetes mellitus with hyperglycemia (HCC) 02/18/2024   Unstable angina (HCC) 05/22/2020   Ventricular tachycardia (HCC) 12/17/2020   Vitamin D  deficiency 09/20/2022   Wound, surgical, infected 06/06/2016   Overview:  sternal    Current Medications: Current Meds  Medication Sig   acetaminophen  (TYLENOL ) 325 MG tablet Take 2 tablets (650 mg total) by mouth every 4 (four) hours as needed for headache or mild pain.   amiodarone  (PACERONE ) 200 MG tablet Take 1 tablet (200 mg total) by mouth daily. Must have appt for refills.   aspirin  81 MG chewable tablet Chew 81 mg by mouth daily.   clopidogrel  (PLAVIX ) 75 MG tablet Take 1 tablet (75 mg total) by mouth daily.   Continuous Glucose Sensor (DEXCOM G7 SENSOR) MISC Change sensor every 10 days as directed   Evolocumab  (REPATHA  SURECLICK) 140 MG/ML SOAJ Inject 140 mg into the skin every 14 (fourteen) days.  FARXIGA  10 MG TABS tablet Take 1 tablet by mouth once daily   furosemide  (LASIX ) 20 MG tablet Take 1 tablet (20 mg total) by mouth daily.   HUMALOG  KWIKPEN 100 UNIT/ML KwikPen Inject 1-20 Units into the skin at bedtime. CBG 70 - 120: 0 units CBG 121 - 150: 3 units CBG 151 - 200: 4 units CBG 201 - 250: 7 units CBG 251 - 300: 11 units CBG 301 - 350: 15 units CBG 351 - 400: 20 units CBG > 400: call MD and obtain STAT lab verification   insulin  glargine, 1 Unit Dial , (TOUJEO  SOLOSTAR) 300 UNIT/ML Solostar Pen Inject 63 Units into the skin daily.   Insulin  Pen Needle (INSUPEN PEN NEEDLES) 32G X 4 MM MISC Use as directed once daily with insulin .   ketoconazole  (NIZORAL ) 2 % cream Apply 1 Application topically daily.   metFORMIN   (GLUCOPHAGE ) 500 MG tablet TAKE 1 TABLET BY MOUTH TWICE DAILY WITH A MEAL   metoprolol  succinate (TOPROL -XL) 25 MG 24 hr tablet TAKE 1 TABLET BY MOUTH IN THE MORNING, AT NOON AND AT BEDTIME   nitroGLYCERIN  (NITROSTAT ) 0.4 MG SL tablet Place 1 tablet (0.4 mg total) under the tongue every 5 (five) minutes as needed for chest pain.   nystatin  (MYCOSTATIN /NYSTOP ) powder Apply 1 Application topically daily as needed (rash/yeast).   rosuvastatin  (CRESTOR ) 20 MG tablet Take 1 tablet (20 mg total) by mouth daily.   SANTYL 250 UNIT/GM ointment Apply 1 Application topically daily.   valsartan  (DIOVAN ) 80 MG tablet Take 1 tablet (80 mg total) by mouth 2 (two) times daily.   Vitamin D , Ergocalciferol , (DRISDOL ) 1.25 MG (50000 UNIT) CAPS capsule TAKE 1 CAPSULE BY MOUTH EVERY SATURDAY      EKGs/Labs/Other Studies Reviewed:    The following studies were reviewed today:  Cardiac Studies & Procedures   ______________________________________________________________________________________________ CARDIAC CATHETERIZATION  CARDIAC CATHETERIZATION 07/21/2021  Conclusion Conclusions: Severe native coronary artery disease, as detailed below, not significantly changed from prior catheterization in 12/2020. Widely patent LIMA-LAD graft and LMCA stent. Patent mid RCA stents with moderate restenosis (~40%). Moderately reduced left ventricular systolic function (LVEF 40-45%). Upper normal left ventricular filling pressure (LVEDP 15 mmHg).  Recommendations: Escalate antianginal therapy; no focal targets again noted on today's catheterization. Aggressive secondary prevention.  Lonni Hanson, MD [facility]  Findings Coronary Findings Diagnostic  Dominance: Right  Left Main Vessel is large. Non-stenotic Ost LM to Mid LM lesion was previously treated.  Left Anterior Descending Vessel is moderate in size. Ost LAD to Mid LAD lesion is 50% stenosed. Mid LAD lesion is 100% stenosed. The lesion is  chronically occluded.  First Diagonal Branch Vessel is small in size.  Second Diagonal Branch Vessel is moderate in size. 2nd Diag lesion is 80% stenosed.  Left Circumflex Vessel is moderate in size. Prox Cx to Mid Cx lesion is 90% stenosed.  First Obtuse Marginal Branch Vessel is small in size. 1st Mrg-1 lesion is 90% stenosed. 1st Mrg-2 lesion is 100% stenosed. The lesion is chronically occluded.  Second Obtuse Marginal Branch Vessel is small in size.  Right Coronary Artery Vessel is large. Prox RCA lesion is 30% stenosed. Mid RCA lesion is 40% stenosed. The lesion was previously treated using a drug eluting stent between 6-12 months ago.  Right Posterior Descending Artery Collaterals RPDA filled by collaterals from 1st RPL.  Collaterals RPDA filled by collaterals from 2nd RPL.  RPDA lesion is 100% stenosed. The lesion is chronically occluded with right-to-right collateral flow.  Right  Posterior Atrioventricular Artery Vessel is moderate in size. RPAV lesion is 90% stenosed.  First Right Posterolateral Branch Vessel is small in size.  Second Right Posterolateral Branch Vessel is small in size.  Third Right Posterolateral Branch Vessel is moderate in size.  LIMA Graft To 2nd Diag LIMA. Origin lesion is 100% stenosed. The lesion is chronically occluded.  Saphenous Graft To 1st Mrg SVG. Origin lesion is 100% stenosed. The lesion is chronically occluded.  Saphenous Graft To RPDA SVG. Origin lesion is 100% stenosed. The lesion is chronically occluded.  LIMA LIMA Graft To Mid LAD LIMA and is normal in caliber.  Intervention  No interventions have been documented.   CARDIAC CATHETERIZATION  CARDIAC CATHETERIZATION 12/19/2020  Conclusion  2nd Diag lesion is 80% stenosed.  Mid LAD lesion is 100% stenosed.  Ost LAD to Mid LAD lesion is 50% stenosed.  1st Mrg-1 lesion is 90% stenosed.  1st Mrg-2 lesion is 100% stenosed.  Prox RCA lesion is 30%  stenosed.  RPDA lesion is 100% stenosed.  RPAV lesion is 90% stenosed.  LIMA graft was not injected.  Origin lesion is 100% stenosed.  SVG graft was not injected.  Origin lesion is 100% stenosed.  SVG graft was not injected.  Origin lesion is 100% stenosed.  LIMA graft was visualized by angiography and is normal in caliber.  Mid RCA lesion is 40% stenosed.  Previously placed Ost LM to Mid LM stent (unknown type) is widely patent.  Ost Cx to Prox Cx lesion is 99% stenosed.  1. Severe triple vessel CAD s/p CABG with patent LIMA graft but 3 known occluded vein grafts 2. Patent left main stent without restenosis. 3. Chronic occlusion of the mid LAD. The mid and distal LAD fills from the patent LIMA graft. The proximal LAD is patent and fills two small caliber diagonal branches. Both diagonal branches have disease but given small caliber of vessels, not favorable for PCI. The vein graft to the diagonal is known to be occluded. 4. The Circumflex is overall small in caliber with diffuse severe proximal disease. The graft to the OM is known to be occluded. 5. The RCA is a large dominant vessel. The vessel is patent. The mid to distal stented segment is patent. The proximal segment of the stent has mild restenosis. There is a severe stenosis in the small caliber posterolateral artery, unchanged from last cath and too small for PCI. Known occlusion of the SVG to the PDA. The PDA is chronically occluded.  Recommendations: No focal targets for PCI. Continue medical management of CAD. EP to see today to discuss ICD.  Findings Coronary Findings Diagnostic  Dominance: Right  Left Main Vessel is large. Previously placed Ost LM to Mid LM stent (unknown type) is widely patent.  Left Anterior Descending Vessel is moderate in size. Ost LAD to Mid LAD lesion is 50% stenosed. Mid LAD lesion is 100% stenosed. The lesion is chronically occluded.  First Diagonal Branch Vessel is small in  size.  Second Diagonal Branch Vessel is moderate in size. 2nd Diag lesion is 80% stenosed.  Left Circumflex Vessel is moderate in size. Ost Cx to Prox Cx lesion is 99% stenosed.  First Obtuse Marginal Branch Vessel is small in size. 1st Mrg-1 lesion is 90% stenosed. 1st Mrg-2 lesion is 100% stenosed. The lesion is chronically occluded.  Second Obtuse Marginal Branch Vessel is small in size.  Right Coronary Artery Vessel is large. Prox RCA lesion is 30% stenosed. Mid RCA lesion is 40% stenosed.  The lesion was previously treated using a drug eluting stent between 6-12 months ago.  Right Posterior Descending Artery RPDA lesion is 100% stenosed. The lesion is chronically occluded with right-to-right collateral flow.  Right Posterior Atrioventricular Artery Vessel is moderate in size. RPAV lesion is 90% stenosed.  First Right Posterolateral Branch Vessel is small in size.  Second Right Posterolateral Branch Vessel is small in size.  Third Right Posterolateral Branch Vessel is moderate in size.  LIMA Graft To 2nd Diag LIMA graft was not injected. Origin lesion is 100% stenosed. The lesion is chronically occluded.  Saphenous Graft To 1st Mrg SVG graft was not injected. Origin lesion is 100% stenosed. The lesion is chronically occluded.  Saphenous Graft To RPDA SVG graft was not injected. Origin lesion is 100% stenosed. The lesion is chronically occluded.  LIMA LIMA Graft To Mid LAD LIMA graft was visualized by angiography and is normal in caliber.  Intervention  No interventions have been documented.     ECHOCARDIOGRAM  ECHOCARDIOGRAM COMPLETE 03/12/2023  Narrative ECHOCARDIOGRAM REPORT    Patient Name:   Jasmine Richardson Date of Exam: 03/12/2023 Medical Rec #:  [id number]     Height:       67.0 in Accession #:    [id number]    Weight:       229.0 lb Date of Birth:  October 09, 1961    BSA:          2.142 m Patient Age:    63 years      BP:           100/70  mmHg Patient Gender: F             HR:           114 bpm. Exam Location:  [place]  Procedure: 2D Echo, Color Doppler, Cardiac Doppler and Intracardiac Opacification Agent  Indications:    Coronary artery disease involving native coronary artery of native heart with angina pectoris (HCC) [I25.119 (ICD-10-CM)]; S/P CABG (coronary artery bypass graft) [Z95.1 (ICD-10-CM)]; Ventricular tachycardia (HCC) [I47.20 (ICD-10-CM)]; On amiodarone  therapy [Z79.899 (ICD-10-CM)]; ICD (implantable cardioverter-defibrillator) in place [Z95.810 (ICD-10-CM)]; Hypertensive heart disease with chronic diastolic congestive heart failure (HCC) [I11.0, I50.32 (ICD-10-CM)]; Mixed hyperlipidemia [E78.2 (ICD-10-CM)]; PAD (peripheral artery disease) (  History:        Patient has prior history of Echocardiogram examinations, most recent 11/23/2021. CHF, CAD and Previous Myocardial Infarction; Risk Factors:Dyslipidemia.  Sonographer:    Charlie Jointer RDCS Referring Phys: [id number] Demarkus Remmel J Jadian Karman  IMPRESSIONS   1. Left ventricular ejection fraction, by estimation, is 60 to 65%. The left ventricle has normal function. The left ventricle has no regional wall motion abnormalities. There is mild left ventricular hypertrophy. Left ventricular diastolic function could not be evaluated. 2. Right ventricular systolic function is normal. The right ventricular size is normal. 3. Left atrial size was mildly dilated. 4. The mitral valve is normal in structure. No evidence of mitral valve regurgitation. No evidence of mitral stenosis. 5. The aortic valve is calcified. There is mild calcification of the aortic valve. There is mild thickening of the aortic valve. Aortic valve regurgitation is not visualized. No aortic stenosis is present. 6. The inferior vena cava is normal in size with greater than 50% respiratory variability, suggesting right atrial pressure of 3 mmHg.  FINDINGS Left Ventricle: Left ventricular ejection  fraction, by estimation, is 60 to 65%. The left ventricle has normal function. The left ventricle has no regional wall motion abnormalities. The left ventricular internal cavity  size was normal in size. There is mild left ventricular hypertrophy. Left ventricular diastolic function could not be evaluated.  Right Ventricle: The right ventricular size is normal. No increase in right ventricular wall thickness. Right ventricular systolic function is normal.  Left Atrium: Left atrial size was mildly dilated.  Right Atrium: Right atrial size was normal in size.  Pericardium: There is no evidence of pericardial effusion.  Mitral Valve: The mitral valve is normal in structure. No evidence of mitral valve regurgitation. No evidence of mitral valve stenosis.  Tricuspid Valve: The tricuspid valve is normal in structure. Tricuspid valve regurgitation is not demonstrated. No evidence of tricuspid stenosis.  Aortic Valve: The aortic valve is calcified. There is mild calcification of the aortic valve. There is mild thickening of the aortic valve. Aortic valve regurgitation is not visualized. No aortic stenosis is present.  Pulmonic Valve: The pulmonic valve was normal in structure. Pulmonic valve regurgitation is not visualized. No evidence of pulmonic stenosis.  Aorta: The aortic root is normal in size and structure.  Venous: The inferior vena cava is normal in size with greater than 50% respiratory variability, suggesting right atrial pressure of 3 mmHg.  IAS/Shunts: No atrial level shunt detected by color flow Doppler.  Additional Comments: A device lead is visualized in the right atrium and right ventricle.   LEFT VENTRICLE PLAX 2D LVIDd:         4.20 cm   Diastology LVIDs:         2.80 cm   LV e' medial:  5.87 cm/s LV PW:         1.20 cm   LV e' lateral: 9.90 cm/s LV IVS:        1.20 cm LVOT diam:     2.00 cm LV SV:         75 LV SV Index:   35 LVOT Area:     3.14 cm   RIGHT VENTRICLE             IVC RV Basal diam:  2.70 cm    IVC diam: 1.80 cm RV S prime:     6.85 cm/s TAPSE (M-mode): 1.8 cm  LEFT ATRIUM             Index        RIGHT ATRIUM           Index LA diam:        4.50 cm 2.10 cm/m   RA Area:     18.30 cm LA Vol (A2C):   62.5 ml 29.18 ml/m  RA Volume:   52.30 ml  24.42 ml/m LA Vol (A4C):   77.8 ml 36.32 ml/m LA Biplane Vol: 71.7 ml 33.47 ml/m AORTIC VALVE LVOT Vmax:   105.00 cm/s LVOT Vmean:  73.200 cm/s LVOT VTI:    0.239 m  AORTA Ao Root diam: 3.50 cm Ao Asc diam:  3.20 cm Ao Desc diam: 2.30 cm   SHUNTS Systemic VTI:  0.24 m Systemic Diam: 2.00 cm  Lamar Fitch MD Electronically signed by Lamar Fitch MD Signature Date/Time: 03/12/2023/4:47:31 PM    Final   TEE  ECHO TEE 11/27/2021  Narrative TRANSESOPHOGEAL ECHO REPORT    Patient Name:   Jasmine Richardson Date of Exam: 11/27/2021 Medical Rec #:  [id number]     Height:       67.0 in Accession #:    [id number]    Weight:       233.5 lb Date of Birth:  Oct 29, 1961    BSA:          2.160 m Patient Age:    60 years      BP:           159/94 mmHg Patient Gender: F             HR:           84 bpm. Exam Location:  Inpatient  Procedure: Transesophageal Echo, 3D Echo and Color Doppler  MODIFIED REPORT: This report was modified by Vina Gull MD on 11/27/2021 due to complete. Indications:     Bacteremia  History:         Patient has prior history of Echocardiogram examinations, most recent 11/23/2021. CHF, Prior CABG and Defibrillator; Risk Factors:Diabetes and Dyslipidemia.  Sonographer:     Damien Senior RDCS Referring Phys:  [id number] ALEENE JERNIGAN Diagnosing Phys: Vina Gull MD  PROCEDURE: After discussion of the risks and benefits of a TEE, an informed consent was obtained from the patient. The transesophogeal probe was passed without difficulty through the esophogus of the patient. Local oropharyngeal anesthetic was provided with viscous lidocaine . Sedation performed  by different physician. The patient was monitored while under deep sedation. Anesthestetic sedation was provided intravenously by Anesthesiology: 527mg  of Propofol , 40mg  of Lidocaine . The patient developed no complications during the procedure.  IMPRESSIONS   1. Device lead seen in RA, RV without obvious vegetations. 2. No obvious vegetations seen. 3. Left ventricular ejection fraction, by estimation, is 50 to 55%. The left ventricle has low normal function. 4. Right ventricular systolic function is normal. The right ventricular size is normal. 5. No left atrial/left atrial appendage thrombus was detected. 6. The mitral valve is normal in structure. Trivial mitral valve regurgitation. 7. The aortic valve is tricuspid. Aortic valve regurgitation is not visualized. Aortic valve sclerosis is present, with no evidence of aortic valve stenosis. 8. Evidence of atrial level shunting detected by color flow Doppler. Agitated saline contrast bubble study was positive with shunting observed within 3-6 cardiac cycles suggestive of interatrial shunt.  FINDINGS Left Ventricle: Left ventricular ejection fraction, by estimation, is 50 to 55%. The left ventricle has low normal function. The left ventricular internal cavity size was normal in size.  Right Ventricle: The right ventricular size is normal. Right vetricular wall thickness was not assessed. Right ventricular systolic function is normal.  Left Atrium: Left atrial size was normal in size. No left atrial/left atrial appendage thrombus was detected.  Right Atrium: Right atrial size was normal in size.  Pericardium: There is no evidence of pericardial effusion.  Mitral Valve: The mitral valve is normal in structure. Trivial mitral valve regurgitation.  Tricuspid Valve: The tricuspid valve is normal in structure. Tricuspid valve regurgitation is trivial.  Aortic Valve: The aortic valve is tricuspid. Aortic valve regurgitation is not visualized.  Aortic valve sclerosis is present, with no evidence of aortic valve stenosis.  Pulmonic Valve: The pulmonic valve was normal in structure. Pulmonic valve regurgitation is trivial.  Aorta: The aortic root is normal in size and structure. There is minimal (Grade I) plaque.  IAS/Shunts: Evidence of atrial level shunting detected by color flow Doppler. Agitated saline contrast bubble study was positive with shunting observed within 3-6 cardiac cycles suggestive of interatrial shunt.  Additional Comments: A device lead is visualized.  Vina Gull MD Electronically signed by Vina Gull MD Signature Date/Time: 11/27/2021/6:08:07 PM    Final (Updated)        ______________________________________________________________________________________________  EKG Interpretation Date/Time:  Friday August 21 2024 16:05:29 EDT Ventricular Rate:  76 PR Interval:  214 QRS Duration:  120 QT Interval:  436 QTC Calculation: 490 R Axis:   116  Text Interpretation: Sinus rhythm with 1st degree A-V block Low voltage QRS RSR' or QR pattern in V1 suggests right ventricular conduction delay Left posterior fascicular block Cannot rule out Anteroseptal infarct (cited on or before 26-May-2024) When compared with ECG of 26-May-2024 15:42, Confirmed by Monetta Rogue ([id number]) on 08/21/2024 4:19:01 PM   Recent Labs: 03/13/2024: TSH 3.630 06/17/2024: ALT 18; BUN 15; Creatinine, Ser 1.15; Hemoglobin 10.6; Magnesium  2.1; Platelets 496; Potassium 3.9; Sodium 138  Recent Lipid Panel    Component Value Date/Time   CHOL 242 (H) 03/13/2024 1036   TRIG 251 (H) 03/13/2024 1036   HDL 59 03/13/2024 1036   CHOLHDL 4.1 03/13/2024 1036   CHOLHDL 2.7 07/20/2021 1232   VLDL 37 07/20/2021 1232   LDLCALC 138 (H) 03/13/2024 1036   LDLDIRECT 91.4 05/22/2020 2122    Physical Exam:    VS:  BP 122/60   Pulse 76   Ht 5' 7 (1.702 m)   Wt 217 lb 12.8 oz (98.8 kg)   LMP  (LMP Unknown)   SpO2 97%   BMI 34.11 kg/m      Wt Readings from Last 3 Encounters:  08/21/24 217 lb 12.8 oz (98.8 kg)  07/22/24 216 lb 6.4 oz (98.2 kg)  06/10/24 219 lb (99.3 kg)     GEN:  Well nourished, well developed in no acute distress HEENT: Normal NECK: No JVD; No carotid bruits LYMPHATICS: No lymphadenopathy CARDIAC: RRR, no murmurs, rubs, gallops RESPIRATORY:  Clear to auscultation without rales, wheezing or rhonchi  ABDOMEN: Soft, non-tender, non-distended MUSCULOSKELETAL:  No edema; No deformity  SKIN: Warm and dry NEUROLOGIC:  Alert and oriented x 3 PSYCHIATRIC:  Normal affect    Signed, Rogue Monetta, MD  08/21/2024 4:45 PM    [facility]

## 2024-08-21 ENCOUNTER — Ambulatory Visit: Attending: Cardiology | Admitting: Cardiology

## 2024-08-21 ENCOUNTER — Encounter: Payer: Self-pay | Admitting: Cardiology

## 2024-08-21 VITALS — BP 122/60 | HR 76 | Ht 67.0 in | Wt 217.8 lb

## 2024-08-21 DIAGNOSIS — I110 Hypertensive heart disease with heart failure: Secondary | ICD-10-CM | POA: Diagnosis not present

## 2024-08-21 DIAGNOSIS — I25119 Atherosclerotic heart disease of native coronary artery with unspecified angina pectoris: Secondary | ICD-10-CM | POA: Diagnosis not present

## 2024-08-21 DIAGNOSIS — I472 Ventricular tachycardia, unspecified: Secondary | ICD-10-CM | POA: Diagnosis not present

## 2024-08-21 DIAGNOSIS — I5032 Chronic diastolic (congestive) heart failure: Secondary | ICD-10-CM | POA: Diagnosis not present

## 2024-08-21 DIAGNOSIS — Z79899 Other long term (current) drug therapy: Secondary | ICD-10-CM | POA: Diagnosis not present

## 2024-08-21 DIAGNOSIS — Z951 Presence of aortocoronary bypass graft: Secondary | ICD-10-CM | POA: Diagnosis not present

## 2024-08-21 DIAGNOSIS — L89610 Pressure ulcer of right heel, unstageable: Secondary | ICD-10-CM | POA: Diagnosis not present

## 2024-08-21 DIAGNOSIS — E782 Mixed hyperlipidemia: Secondary | ICD-10-CM

## 2024-08-21 DIAGNOSIS — Z95810 Presence of automatic (implantable) cardiac defibrillator: Secondary | ICD-10-CM | POA: Diagnosis not present

## 2024-08-21 DIAGNOSIS — L97413 Non-pressure chronic ulcer of right heel and midfoot with necrosis of muscle: Secondary | ICD-10-CM | POA: Diagnosis not present

## 2024-08-21 NOTE — Patient Instructions (Signed)
 Medication Instructions:  Your physician recommends that you continue on your current medications as directed. Please refer to the Current Medication list given to you today.  *If you need a refill on your cardiac medications before your next appointment, please call your pharmacy*  Lab Work: None If you have labs (blood work) drawn today and your tests are completely normal, you will receive your results only by: MyChart Message (if you have MyChart) OR A paper copy in the mail If you have any lab test that is abnormal or we need to change your treatment, we will call you to review the results.  Testing/Procedures: None  Follow-Up: At [facility], you and your health needs are our priority.  As part of our continuing mission to provide you with exceptional heart care, our providers are all part of one team.  This team includes your primary Cardiologist (physician) and Advanced Practice Providers or APPs (Physician Assistants and Nurse Practitioners) who all work together to provide you with the care you need, when you need it.  Your next appointment:   6 month(s)  Provider:   Redell Leiter, MD    We recommend signing up for the patient portal called MyChart.  Sign up information is provided on this After Visit Summary.  MyChart is used to connect with patients for Virtual Visits (Telemedicine).  Patients are able to view lab/test results, encounter notes, upcoming appointments, etc.  Non-urgent messages can be sent to your provider as well.   To learn more about what you can do with MyChart, go to [email or web address].   Other Instructions None

## 2024-08-24 ENCOUNTER — Ambulatory Visit (INDEPENDENT_AMBULATORY_CARE_PROVIDER_SITE_OTHER): Admitting: Podiatry

## 2024-08-24 ENCOUNTER — Other Ambulatory Visit: Payer: Self-pay | Admitting: Podiatry

## 2024-08-24 ENCOUNTER — Other Ambulatory Visit (HOSPITAL_BASED_OUTPATIENT_CLINIC_OR_DEPARTMENT_OTHER): Payer: Self-pay

## 2024-08-24 DIAGNOSIS — E08621 Diabetes mellitus due to underlying condition with foot ulcer: Secondary | ICD-10-CM

## 2024-08-24 DIAGNOSIS — L97412 Non-pressure chronic ulcer of right heel and midfoot with fat layer exposed: Secondary | ICD-10-CM

## 2024-08-24 NOTE — Progress Notes (Signed)
 Chief Complaint  Patient presents with   Routine Post Op    POV # 2 DOS 6/27 RIGHT IRRIGATION AND DEBRIDEMENT ABSCESS  Wearing surgical shoe, wound vac removed last night due to odor per instructions from wound care. Has completed antibiotics. Had some sharp pain last night. The heel is red. Patient denies fever, but did have two episodes of vomiting last night.     Subjective:  63 y.o. female with PMHx of diabetes mellitus presenting to the office status post irrigation and debridement right plantar heel performed inpatient on 06/12/2024.  Dr. Marolyn Honour.  Currently being managed at the wound care center in [place] and orders are in place for home wound VAC dressing changes throughout the week.   Past Medical History:  Diagnosis Date   Abnormal EKG 10/23/2021   Abnormal mammogram 08/09/2023   Abnormal mammogram of left breast 09/04/2021   Abnormal stress test 10/23/2021   Abscess of bursa of right foot 06/12/2024   Abscess of left elbow 02/18/2024   Abscess of right foot 06/10/2024   Absolute anemia 09/20/2022   Acquired varus deformity of foot, left    Acute chest pain 05/08/2016   Acute hemorrhagic cystitis 08/09/2023   Acute osteomyelitis of right calcaneus (HCC) 06/15/2024   AICD (automatic cardioverter/defibrillator) present    St Jude/Abbott device   AKI (acute kidney injury) (HCC) 10/23/2021   Amputation of right great toe (HCC) 09/20/2022   Angina pectoris (HCC) 09/11/2017   Anxiety 10/23/2021   B12 deficiency 09/20/2022   Bacteremia 11/22/2021   Breast wound, right, subsequent encounter 09/04/2021   Burping 05/08/2016   Cancer (HCC)    cervical - hysterectomy   Cellulitis of right breast 10/23/2021   Cellulitis of right foot 06/15/2024   Cellulitis, wound, post-operative 10/09/2021   Chronic diastolic heart failure (HCC) 11/06/2016   Chronic osteomyelitis of left foot (HCC) 10/16/2023   Colon cancer screening 09/20/2022   Coronary artery disease  involving native coronary artery of native heart with angina pectoris (HCC) 09/12/2015   Overview:  PCI and stent of RCA 2009, last cath 2012 with medical therapy  CABG May 2017   Demand ischemia [facility])    Diabetes mellitus due to underlying condition with hyperglycemia, with long-term current use of insulin  (HCC) 09/20/2022   Diabetic foot infection (HCC) 11/21/2021   Diabetic foot ulcer (HCC) 02/16/2020   Emphysema lung (HCC) 09/11/2017   patient not aware of this dx   Encounter for preoperative assessment 10/16/2023   Essential hypertension 09/12/2015   Gangrene (HCC) 10/23/2021   GERD (gastroesophageal reflux disease) 05/08/2016   Hematoma 10/23/2021   Hiatal hernia 10/23/2021   History of methicillin resistant staphylococcus aureus (MRSA)    Hyperglycemia 10/23/2021   Hyperglycemia due to type 2 diabetes mellitus (HCC) 10/23/2021   Hyperkalemia    Hyperlipidemia 09/12/2015   Hyponatremia 10/23/2021   ICD (implantable cardioverter-defibrillator) in place 05/16/2021   Infection of great toe 10/23/2021   Intertrigo 09/20/2022   Lactic acidosis 10/23/2021   Leukocytosis 10/23/2021   Mediastinitis 06/28/2016   Morbid obesity (HCC) 05/08/2016   Myocardial infarction (HCC)    Needs flu shot 09/20/2022   NSTEMI (non-ST elevated myocardial infarction) (HCC) 05/22/2020   Obesity (BMI 30-39.9) 10/23/2021   Partial nontraumatic amputation of left foot (HCC) 09/20/2022   Perineal abscess 10/23/2021   Peripheral vascular disease (HCC)    Right foot infection 06/10/2024   S/P CABG (coronary artery bypass graft) 06/03/2016   Overview:  The patient underwent sternal reconstruction  on 06/21/16 with pec flaps for mediastinitis from a prior CABG in May 2017. On admission, she was critically ill from sepsis and had altered mental status. She was last seen in clinic on 08/09/16 at which time she was doing well.   Severe sepsis (HCC) 06/03/2016   Sinus tachycardia 05/08/2016   Tobacco use disorder  04/20/2016   Overview:  Quit in May 2017.   Type 2 diabetes mellitus with foot ulcer (HCC) 05/08/2016   Uncontrolled type 2 diabetes mellitus with hyperglycemia (HCC) 02/18/2024   Unstable angina (HCC) 05/22/2020   Ventricular tachycardia (HCC) 12/17/2020   Vitamin D  deficiency 09/20/2022   Wound, surgical, infected 06/06/2016   Overview:  sternal    Past Surgical History:  Procedure Laterality Date   ABDOMINAL AORTOGRAM W/LOWER EXTREMITY N/A 10/11/2021   Procedure: ABDOMINAL AORTOGRAM W/LOWER EXTREMITY;  Surgeon: Gretta Lonni PARAS, MD;  [facility]: [facility] IN[facility];  Service: Cardiovascular;  Laterality: N/A;   AMPUTATION TOE Right 02/20/2020   Procedure: AMPUTATION RIGHT GREAT  TOE;  Surgeon: Tobie Franky SQUIBB, DPM;  Location: [facility] OR;  Service: Podiatry;  Laterality: Right;   BLADDER SURGERY     BONE BIOPSY Left 02/01/2022   Procedure: BONE BIOPSY LEFT FOOT, IRRIGATION AND DEBRIDEMENT;  Surgeon: Burt Fus, DPM;  Location: [facility] OR;  Service: Podiatry;  Laterality: Left;   BONE BIOPSY Left 10/31/2023   Procedure: BONE BIOPSY;  Surgeon: Janit Thresa HERO, DPM;  Location: [facility] ORS;  Service: Orthopedics/Podiatry;  Laterality: Left;   BUBBLE STUDY  11/27/2021   Procedure: BUBBLE STUDY;  Surgeon: Okey Vina GAILS, MD;  Location: [facility] ENDOSCOPY;  Service: Cardiovascular;;   CARDIAC CATHETERIZATION     CORONARY ARTERY BYPASS GRAFT     CORONARY STENT INTERVENTION N/A 05/25/2020   Procedure: CORONARY STENT INTERVENTION;  Surgeon: Darron Deatrice LABOR, MD;  [facility]: [facility];  Service: Cardiovascular;  Laterality: N/A;   CORONARY STENT INTERVENTION N/A 06/22/2020   Procedure: CORONARY STENT INTERVENTION;  Surgeon: Anner Alm ORN, MD;  Location: [facility] INVASIVE CV LAB;  Service: Cardiovascular;  Laterality: N/A;   CORONARY ULTRASOUND/IVUS N/A 06/22/2020   Procedure: Intravascular Ultrasound/IVUS;  Surgeon: Anner Alm ORN, MD;  Location: [facility] INVASIVE CV LAB;  Service: Cardiovascular;  Laterality: N/A;    ICD IMPLANT N/A 12/19/2020   Procedure: ICD IMPLANT;  Surgeon: Waddell Danelle ORN, MD;  Location: [facility] INVASIVE CV LAB;  Service: Cardiovascular;  Laterality: N/A;   INCISION AND DRAINAGE Right 02/19/2020   Procedure: INCISION AND DRAINAGE;  Surgeon: Gershon Donnice SAUNDERS, DPM;  Location: [facility] OR;  Service: Podiatry;  Laterality: Right;  Block done by surgeon   INCISION AND DRAINAGE OF WOUND Left 10/31/2023   Procedure: IRRIGATION AND DEBRIDEMENT WOUND;  Surgeon: Janit Thresa HERO, DPM;  Location: [facility] ORS;  Service: Orthopedics/Podiatry;  Laterality: Left;   IRRIGATION AND DEBRIDEMENT ABSCESS Right 06/12/2024   Procedure: IRRIGATION AND DEBRIDEMENT ABSCESS;  Surgeon: Malvin Marsa FALCON, DPM;  Location: [facility] OR;  Service: Orthopedics/Podiatry;  Laterality: Right;  I&D R heel, possible bone biopsy   IRRIGATION AND DEBRIDEMENT FOOT Left 10/10/2021   Procedure: IRRIGATION AND DEBRIDEMENT FOOT;  Surgeon: Burt Fus, DPM;  Location: [facility] OR;  Service: Podiatry;  Laterality: Left;   IRRIGATION AND DEBRIDEMENT FOOT Left 11/23/2021   Procedure: IRRIGATION AND DEBRIDEMENT FOOT;  Surgeon: Joya Stabs, MD;  Location: [facility] OR;  Service: Podiatry;  Laterality: Left;  will do local block   LEFT HEART CATH AND CORS/GRAFTS ANGIOGRAPHY N/A 05/23/2020   Procedure: LEFT HEART CATH  AND CORS/GRAFTS ANGIOGRAPHY;  Surgeon: Mady Bruckner, MD;  [facility];  Service: Cardiovascular;  Laterality: N/A;   LEFT HEART CATH AND CORS/GRAFTS ANGIOGRAPHY N/A 12/19/2020   Procedure: LEFT HEART CATH AND CORS/GRAFTS ANGIOGRAPHY;  Surgeon: Verlin Bruckner BIRCH, MD;  [facility]: [facility];  Service: Cardiovascular;  Laterality: N/A;   LEFT HEART CATH AND CORS/GRAFTS ANGIOGRAPHY N/A 07/21/2021   Procedure: LEFT HEART CATH AND CORS/GRAFTS ANGIOGRAPHY;  Surgeon: Mady Bruckner, MD;  [facility];  Service: Cardiovascular;  Laterality: N/A;   METATARSAL HEAD EXCISION Right 05/02/2020   Procedure: FIRST  METATARSAL HEAD RESECTION; RIGHT FOOT WOUND CLOSURE;  Surgeon: Burt Fus, DPM;  Location: [facility];  Service: Podiatry;  Laterality: Right;  MAC W/LOCAL   PERIPHERAL VASCULAR BALLOON ANGIOPLASTY Left 10/11/2021   Procedure: PERIPHERAL VASCULAR BALLOON ANGIOPLASTY;  Surgeon: Gretta Bruckner PARAS, MD;  [facility]: [facility];  Service: Cardiovascular;  Laterality: Left;  Anterior Tibial Artery   TEE WITHOUT CARDIOVERSION N/A 11/27/2021   Procedure: TRANSESOPHAGEAL ECHOCARDIOGRAM (TEE);  Surgeon: Okey Vina GAILS, MD;  Location: [facility] ENDOSCOPY;  Service: Cardiovascular;  Laterality: N/A;   TIBIALIS TENDON TRANSFER / REPAIR Left 12/06/2023   Procedure: TIBIALIS TENDON TRANSFER;  Surgeon: Janit Thresa HERO, DPM;  Location: [facility] ORS;  Service: Orthopedics/Podiatry;  Laterality: Left;   TRANSMETATARSAL AMPUTATION Left 11/23/2021   Procedure: TRANSMETATARSAL AMPUTATION;  Surgeon: Joya Stabs, MD;  Location: [facility] OR;  Service: Podiatry;  Laterality: Left;   TUBAL LIGATION      Allergies  Allergen Reactions   Vancomycin  Anaphylaxis   Chlorhexidine  Gluconate Itching    Received CHG bath, began itching, required benadryl    Ativan  [Lorazepam ] Other (See Comments)    hallucinations   Cat Dander Other (See Comments)    Sneezing, watery eyes.   Other Itching    Dial  Soap   Tramadol  Other (See Comments)    Hallucinations   Codeine Rash    RT heel 06/30/2024   RT heel 08/24/2024  Objective/Physical Exam General: The patient is alert and oriented x3 in no acute distress.  Dermatology:  Wound #1 noted to the right plantar heel measuring approximately 3.5 x 4.5 x 0.5 cm (LxWxD).  Significant amount of fibrotic tissue with serous drainage noted.  No exposed bone.  No malodor.  Vascular: Palpable pedal pulses bilaterally. No edema or erythema noted. Capillary refill within normal limits.  Clinically no concern for vascular compromise  Neurological: Light touch and protective  threshold diminished bilaterally.   Musculoskeletal Exam: History of TMA LLE w/ TA tendon transfer  Assessment: 1.  Ulcer right plantar heel secondary to diabetes mellitus 2. diabetes mellitus w/ peripheral neuropathy 3. S/p RT heel irrigation and debridement.  DOS: 06/12/2024. inpatient.  Dr. Malvin   Plan of Care:  -Patient was evaluated. -Medically necessary excisional debridement including subcutaneous tissue was performed using a tissue nipper and a chisel blade. Excisional debridement of all the necrotic nonviable tissue down to healthy bleeding viable tissue was performed with post-debridement measurements same as pre-. -The wound was cleansed and dry sterile dressing applied. - Continue holding wound VAC dressing changes as well as weekly management at the as per wound care center -Continue minimal WBAT -Return to clinic 4 weeks   Thresa EMERSON Janit, DPM [facility]  Dr. Thresa EMERSON Janit, DPM    [address]  [place], [place] [address]                Office [phone]  Fax [phone]

## 2024-08-24 NOTE — Addendum Note (Signed)
 Addended by: NETTA FLEETING I on: 08/24/2024 04:42 PM   Modules accepted: Orders

## 2024-08-24 NOTE — Progress Notes (Signed)
Remote ICD Transmission.

## 2024-08-25 ENCOUNTER — Other Ambulatory Visit: Payer: Self-pay | Admitting: Podiatry

## 2024-08-25 ENCOUNTER — Other Ambulatory Visit (HOSPITAL_BASED_OUTPATIENT_CLINIC_OR_DEPARTMENT_OTHER): Payer: Self-pay

## 2024-08-25 DIAGNOSIS — I110 Hypertensive heart disease with heart failure: Secondary | ICD-10-CM | POA: Diagnosis not present

## 2024-08-25 DIAGNOSIS — M71071 Abscess of bursa, right ankle and foot: Secondary | ICD-10-CM | POA: Diagnosis not present

## 2024-08-25 DIAGNOSIS — E1140 Type 2 diabetes mellitus with diabetic neuropathy, unspecified: Secondary | ICD-10-CM | POA: Diagnosis not present

## 2024-08-25 DIAGNOSIS — E559 Vitamin D deficiency, unspecified: Secondary | ICD-10-CM | POA: Diagnosis not present

## 2024-08-25 DIAGNOSIS — J439 Emphysema, unspecified: Secondary | ICD-10-CM | POA: Diagnosis not present

## 2024-08-25 DIAGNOSIS — M86171 Other acute osteomyelitis, right ankle and foot: Secondary | ICD-10-CM | POA: Diagnosis not present

## 2024-08-25 DIAGNOSIS — E785 Hyperlipidemia, unspecified: Secondary | ICD-10-CM | POA: Diagnosis not present

## 2024-08-25 DIAGNOSIS — I472 Ventricular tachycardia, unspecified: Secondary | ICD-10-CM | POA: Diagnosis not present

## 2024-08-25 DIAGNOSIS — Z7984 Long term (current) use of oral hypoglycemic drugs: Secondary | ICD-10-CM | POA: Diagnosis not present

## 2024-08-25 DIAGNOSIS — I252 Old myocardial infarction: Secondary | ICD-10-CM | POA: Diagnosis not present

## 2024-08-25 DIAGNOSIS — E1165 Type 2 diabetes mellitus with hyperglycemia: Secondary | ICD-10-CM | POA: Diagnosis not present

## 2024-08-25 DIAGNOSIS — L02611 Cutaneous abscess of right foot: Secondary | ICD-10-CM | POA: Diagnosis not present

## 2024-08-25 DIAGNOSIS — E1169 Type 2 diabetes mellitus with other specified complication: Secondary | ICD-10-CM | POA: Diagnosis not present

## 2024-08-25 DIAGNOSIS — E1151 Type 2 diabetes mellitus with diabetic peripheral angiopathy without gangrene: Secondary | ICD-10-CM | POA: Diagnosis not present

## 2024-08-25 DIAGNOSIS — I2510 Atherosclerotic heart disease of native coronary artery without angina pectoris: Secondary | ICD-10-CM | POA: Diagnosis not present

## 2024-08-25 DIAGNOSIS — L03115 Cellulitis of right lower limb: Secondary | ICD-10-CM | POA: Diagnosis not present

## 2024-08-25 DIAGNOSIS — M21172 Varus deformity, not elsewhere classified, left ankle: Secondary | ICD-10-CM | POA: Diagnosis not present

## 2024-08-25 DIAGNOSIS — B951 Streptococcus, group B, as the cause of diseases classified elsewhere: Secondary | ICD-10-CM | POA: Diagnosis not present

## 2024-08-25 DIAGNOSIS — E538 Deficiency of other specified B group vitamins: Secondary | ICD-10-CM | POA: Diagnosis not present

## 2024-08-25 DIAGNOSIS — I5032 Chronic diastolic (congestive) heart failure: Secondary | ICD-10-CM | POA: Diagnosis not present

## 2024-08-25 DIAGNOSIS — F419 Anxiety disorder, unspecified: Secondary | ICD-10-CM | POA: Diagnosis not present

## 2024-08-25 DIAGNOSIS — N390 Urinary tract infection, site not specified: Secondary | ICD-10-CM | POA: Diagnosis not present

## 2024-08-25 DIAGNOSIS — S91341D Puncture wound with foreign body, right foot, subsequent encounter: Secondary | ICD-10-CM | POA: Diagnosis not present

## 2024-08-25 DIAGNOSIS — D649 Anemia, unspecified: Secondary | ICD-10-CM | POA: Diagnosis not present

## 2024-08-25 MED ORDER — AMOXICILLIN-POT CLAVULANATE 875-125 MG PO TABS
1.0000 | ORAL_TABLET | Freq: Two times a day (BID) | ORAL | 0 refills | Status: DC
  Filled 2024-08-25: qty 14, 7d supply, fill #0

## 2024-08-28 DIAGNOSIS — L89610 Pressure ulcer of right heel, unstageable: Secondary | ICD-10-CM | POA: Diagnosis not present

## 2024-08-28 DIAGNOSIS — L97413 Non-pressure chronic ulcer of right heel and midfoot with necrosis of muscle: Secondary | ICD-10-CM | POA: Diagnosis not present

## 2024-08-29 LAB — WOUND CULTURE
MICRO NUMBER:: 16936667
SPECIMEN QUALITY:: ADEQUATE

## 2024-08-29 LAB — HOUSE ACCOUNT TRACKING

## 2024-09-01 DIAGNOSIS — B951 Streptococcus, group B, as the cause of diseases classified elsewhere: Secondary | ICD-10-CM | POA: Diagnosis not present

## 2024-09-01 DIAGNOSIS — E1151 Type 2 diabetes mellitus with diabetic peripheral angiopathy without gangrene: Secondary | ICD-10-CM | POA: Diagnosis not present

## 2024-09-01 DIAGNOSIS — I110 Hypertensive heart disease with heart failure: Secondary | ICD-10-CM | POA: Diagnosis not present

## 2024-09-01 DIAGNOSIS — Z7984 Long term (current) use of oral hypoglycemic drugs: Secondary | ICD-10-CM | POA: Diagnosis not present

## 2024-09-01 DIAGNOSIS — D649 Anemia, unspecified: Secondary | ICD-10-CM | POA: Diagnosis not present

## 2024-09-01 DIAGNOSIS — E559 Vitamin D deficiency, unspecified: Secondary | ICD-10-CM | POA: Diagnosis not present

## 2024-09-01 DIAGNOSIS — E538 Deficiency of other specified B group vitamins: Secondary | ICD-10-CM | POA: Diagnosis not present

## 2024-09-01 DIAGNOSIS — S91341D Puncture wound with foreign body, right foot, subsequent encounter: Secondary | ICD-10-CM | POA: Diagnosis not present

## 2024-09-01 DIAGNOSIS — L03115 Cellulitis of right lower limb: Secondary | ICD-10-CM | POA: Diagnosis not present

## 2024-09-01 DIAGNOSIS — E1140 Type 2 diabetes mellitus with diabetic neuropathy, unspecified: Secondary | ICD-10-CM | POA: Diagnosis not present

## 2024-09-01 DIAGNOSIS — N390 Urinary tract infection, site not specified: Secondary | ICD-10-CM | POA: Diagnosis not present

## 2024-09-01 DIAGNOSIS — E1169 Type 2 diabetes mellitus with other specified complication: Secondary | ICD-10-CM | POA: Diagnosis not present

## 2024-09-01 DIAGNOSIS — M86171 Other acute osteomyelitis, right ankle and foot: Secondary | ICD-10-CM | POA: Diagnosis not present

## 2024-09-01 DIAGNOSIS — E1165 Type 2 diabetes mellitus with hyperglycemia: Secondary | ICD-10-CM | POA: Diagnosis not present

## 2024-09-01 DIAGNOSIS — I472 Ventricular tachycardia, unspecified: Secondary | ICD-10-CM | POA: Diagnosis not present

## 2024-09-01 DIAGNOSIS — I2510 Atherosclerotic heart disease of native coronary artery without angina pectoris: Secondary | ICD-10-CM | POA: Diagnosis not present

## 2024-09-01 DIAGNOSIS — J439 Emphysema, unspecified: Secondary | ICD-10-CM | POA: Diagnosis not present

## 2024-09-01 DIAGNOSIS — F419 Anxiety disorder, unspecified: Secondary | ICD-10-CM | POA: Diagnosis not present

## 2024-09-01 DIAGNOSIS — M21172 Varus deformity, not elsewhere classified, left ankle: Secondary | ICD-10-CM | POA: Diagnosis not present

## 2024-09-01 DIAGNOSIS — M71071 Abscess of bursa, right ankle and foot: Secondary | ICD-10-CM | POA: Diagnosis not present

## 2024-09-01 DIAGNOSIS — I5032 Chronic diastolic (congestive) heart failure: Secondary | ICD-10-CM | POA: Diagnosis not present

## 2024-09-01 DIAGNOSIS — I252 Old myocardial infarction: Secondary | ICD-10-CM | POA: Diagnosis not present

## 2024-09-01 DIAGNOSIS — L02611 Cutaneous abscess of right foot: Secondary | ICD-10-CM | POA: Diagnosis not present

## 2024-09-01 DIAGNOSIS — E785 Hyperlipidemia, unspecified: Secondary | ICD-10-CM | POA: Diagnosis not present

## 2024-09-04 DIAGNOSIS — L97413 Non-pressure chronic ulcer of right heel and midfoot with necrosis of muscle: Secondary | ICD-10-CM | POA: Diagnosis not present

## 2024-09-04 DIAGNOSIS — L89610 Pressure ulcer of right heel, unstageable: Secondary | ICD-10-CM | POA: Diagnosis not present

## 2024-09-07 DIAGNOSIS — N390 Urinary tract infection, site not specified: Secondary | ICD-10-CM | POA: Diagnosis not present

## 2024-09-07 DIAGNOSIS — E1165 Type 2 diabetes mellitus with hyperglycemia: Secondary | ICD-10-CM | POA: Diagnosis not present

## 2024-09-07 DIAGNOSIS — D649 Anemia, unspecified: Secondary | ICD-10-CM | POA: Diagnosis not present

## 2024-09-07 DIAGNOSIS — F419 Anxiety disorder, unspecified: Secondary | ICD-10-CM | POA: Diagnosis not present

## 2024-09-07 DIAGNOSIS — M86171 Other acute osteomyelitis, right ankle and foot: Secondary | ICD-10-CM | POA: Diagnosis not present

## 2024-09-07 DIAGNOSIS — L03115 Cellulitis of right lower limb: Secondary | ICD-10-CM | POA: Diagnosis not present

## 2024-09-07 DIAGNOSIS — I5032 Chronic diastolic (congestive) heart failure: Secondary | ICD-10-CM | POA: Diagnosis not present

## 2024-09-07 DIAGNOSIS — M21172 Varus deformity, not elsewhere classified, left ankle: Secondary | ICD-10-CM | POA: Diagnosis not present

## 2024-09-07 DIAGNOSIS — E1169 Type 2 diabetes mellitus with other specified complication: Secondary | ICD-10-CM | POA: Diagnosis not present

## 2024-09-07 DIAGNOSIS — I472 Ventricular tachycardia, unspecified: Secondary | ICD-10-CM | POA: Diagnosis not present

## 2024-09-07 DIAGNOSIS — I252 Old myocardial infarction: Secondary | ICD-10-CM | POA: Diagnosis not present

## 2024-09-07 DIAGNOSIS — Z7984 Long term (current) use of oral hypoglycemic drugs: Secondary | ICD-10-CM | POA: Diagnosis not present

## 2024-09-07 DIAGNOSIS — E1140 Type 2 diabetes mellitus with diabetic neuropathy, unspecified: Secondary | ICD-10-CM | POA: Diagnosis not present

## 2024-09-07 DIAGNOSIS — I110 Hypertensive heart disease with heart failure: Secondary | ICD-10-CM | POA: Diagnosis not present

## 2024-09-07 DIAGNOSIS — E1151 Type 2 diabetes mellitus with diabetic peripheral angiopathy without gangrene: Secondary | ICD-10-CM | POA: Diagnosis not present

## 2024-09-07 DIAGNOSIS — E559 Vitamin D deficiency, unspecified: Secondary | ICD-10-CM | POA: Diagnosis not present

## 2024-09-07 DIAGNOSIS — I2510 Atherosclerotic heart disease of native coronary artery without angina pectoris: Secondary | ICD-10-CM | POA: Diagnosis not present

## 2024-09-07 DIAGNOSIS — M71071 Abscess of bursa, right ankle and foot: Secondary | ICD-10-CM | POA: Diagnosis not present

## 2024-09-07 DIAGNOSIS — S91341D Puncture wound with foreign body, right foot, subsequent encounter: Secondary | ICD-10-CM | POA: Diagnosis not present

## 2024-09-07 DIAGNOSIS — L02611 Cutaneous abscess of right foot: Secondary | ICD-10-CM | POA: Diagnosis not present

## 2024-09-07 DIAGNOSIS — E785 Hyperlipidemia, unspecified: Secondary | ICD-10-CM | POA: Diagnosis not present

## 2024-09-07 DIAGNOSIS — B951 Streptococcus, group B, as the cause of diseases classified elsewhere: Secondary | ICD-10-CM | POA: Diagnosis not present

## 2024-09-07 DIAGNOSIS — J439 Emphysema, unspecified: Secondary | ICD-10-CM | POA: Diagnosis not present

## 2024-09-07 DIAGNOSIS — E538 Deficiency of other specified B group vitamins: Secondary | ICD-10-CM | POA: Diagnosis not present

## 2024-09-10 DIAGNOSIS — L89610 Pressure ulcer of right heel, unstageable: Secondary | ICD-10-CM | POA: Diagnosis not present

## 2024-09-10 DIAGNOSIS — L97413 Non-pressure chronic ulcer of right heel and midfoot with necrosis of muscle: Secondary | ICD-10-CM | POA: Diagnosis not present

## 2024-09-14 ENCOUNTER — Telehealth: Payer: Self-pay

## 2024-09-14 DIAGNOSIS — N390 Urinary tract infection, site not specified: Secondary | ICD-10-CM | POA: Diagnosis not present

## 2024-09-14 DIAGNOSIS — E1169 Type 2 diabetes mellitus with other specified complication: Secondary | ICD-10-CM | POA: Diagnosis not present

## 2024-09-14 DIAGNOSIS — E559 Vitamin D deficiency, unspecified: Secondary | ICD-10-CM | POA: Diagnosis not present

## 2024-09-14 DIAGNOSIS — D649 Anemia, unspecified: Secondary | ICD-10-CM | POA: Diagnosis not present

## 2024-09-14 DIAGNOSIS — M86171 Other acute osteomyelitis, right ankle and foot: Secondary | ICD-10-CM | POA: Diagnosis not present

## 2024-09-14 DIAGNOSIS — E1151 Type 2 diabetes mellitus with diabetic peripheral angiopathy without gangrene: Secondary | ICD-10-CM | POA: Diagnosis not present

## 2024-09-14 DIAGNOSIS — I472 Ventricular tachycardia, unspecified: Secondary | ICD-10-CM | POA: Diagnosis not present

## 2024-09-14 DIAGNOSIS — J439 Emphysema, unspecified: Secondary | ICD-10-CM | POA: Diagnosis not present

## 2024-09-14 DIAGNOSIS — I5032 Chronic diastolic (congestive) heart failure: Secondary | ICD-10-CM | POA: Diagnosis not present

## 2024-09-14 DIAGNOSIS — F419 Anxiety disorder, unspecified: Secondary | ICD-10-CM | POA: Diagnosis not present

## 2024-09-14 DIAGNOSIS — E1165 Type 2 diabetes mellitus with hyperglycemia: Secondary | ICD-10-CM | POA: Diagnosis not present

## 2024-09-14 DIAGNOSIS — B951 Streptococcus, group B, as the cause of diseases classified elsewhere: Secondary | ICD-10-CM | POA: Diagnosis not present

## 2024-09-14 DIAGNOSIS — I2510 Atherosclerotic heart disease of native coronary artery without angina pectoris: Secondary | ICD-10-CM | POA: Diagnosis not present

## 2024-09-14 DIAGNOSIS — M21172 Varus deformity, not elsewhere classified, left ankle: Secondary | ICD-10-CM | POA: Diagnosis not present

## 2024-09-14 DIAGNOSIS — Z7984 Long term (current) use of oral hypoglycemic drugs: Secondary | ICD-10-CM | POA: Diagnosis not present

## 2024-09-14 DIAGNOSIS — I252 Old myocardial infarction: Secondary | ICD-10-CM | POA: Diagnosis not present

## 2024-09-14 DIAGNOSIS — I110 Hypertensive heart disease with heart failure: Secondary | ICD-10-CM | POA: Diagnosis not present

## 2024-09-14 DIAGNOSIS — E538 Deficiency of other specified B group vitamins: Secondary | ICD-10-CM | POA: Diagnosis not present

## 2024-09-14 DIAGNOSIS — L02611 Cutaneous abscess of right foot: Secondary | ICD-10-CM | POA: Diagnosis not present

## 2024-09-14 DIAGNOSIS — L03115 Cellulitis of right lower limb: Secondary | ICD-10-CM | POA: Diagnosis not present

## 2024-09-14 DIAGNOSIS — E785 Hyperlipidemia, unspecified: Secondary | ICD-10-CM | POA: Diagnosis not present

## 2024-09-14 DIAGNOSIS — E1140 Type 2 diabetes mellitus with diabetic neuropathy, unspecified: Secondary | ICD-10-CM | POA: Diagnosis not present

## 2024-09-14 DIAGNOSIS — S91341D Puncture wound with foreign body, right foot, subsequent encounter: Secondary | ICD-10-CM | POA: Diagnosis not present

## 2024-09-14 DIAGNOSIS — M71071 Abscess of bursa, right ankle and foot: Secondary | ICD-10-CM | POA: Diagnosis not present

## 2024-09-14 NOTE — Telephone Encounter (Signed)
 Called patient, she stated she is having some uti symptoms and was wanting something for it. Recommend she come in for office visit so urine can be tested. Patient stated she is back in a wheel chair and has to have someone bring her to the office but she will call back today and see if someone can bring her tomorrow or Wednesday.  Copied from CRM #[id number]. Topic: General - Other >> Sep 14, 2024  1:44 PM Joesph NOVAK wrote: Reason for CRM: Patient calling to speak to Camie Moats nurse.

## 2024-09-15 ENCOUNTER — Ambulatory Visit (INDEPENDENT_AMBULATORY_CARE_PROVIDER_SITE_OTHER)

## 2024-09-15 ENCOUNTER — Other Ambulatory Visit (HOSPITAL_BASED_OUTPATIENT_CLINIC_OR_DEPARTMENT_OTHER): Payer: Self-pay

## 2024-09-15 VITALS — BP 124/78 | HR 67 | Temp 97.3°F | Resp 16 | Ht 67.0 in | Wt 214.0 lb

## 2024-09-15 DIAGNOSIS — R300 Dysuria: Secondary | ICD-10-CM | POA: Diagnosis not present

## 2024-09-15 LAB — POCT URINALYSIS DIP (CLINITEK)
Bilirubin, UA: NEGATIVE
Glucose, UA: >=1000 mg/dL — AB
Ketones, POC UA: NEGATIVE mg/dL
Nitrite, UA: NEGATIVE
POC PROTEIN,UA: 100 — AB
Spec Grav, UA: 1.02 (ref 1.010–1.025)
Urobilinogen, UA: 0.2 U/dL
pH, UA: 6 (ref 5.0–8.0)

## 2024-09-15 MED ORDER — NITROFURANTOIN MONOHYD MACRO 100 MG PO CAPS
100.0000 mg | ORAL_CAPSULE | Freq: Two times a day (BID) | ORAL | 0 refills | Status: AC
  Filled 2024-09-15: qty 14, 7d supply, fill #0

## 2024-09-15 NOTE — Addendum Note (Signed)
 Addended by: RANEE KATO I on: 09/15/2024 04:38 PM   Modules accepted: Orders

## 2024-09-15 NOTE — Progress Notes (Signed)
 Acute Office Visit  Subjective:    [name], female    DOB: 02/04/1961, 63 y.o.   MRN: [id number]  Chief Complaint  Patient presents with   Urinary Tract Infection    HPI:   Discussed the use of AI scribe software for clinical note transcription with the patient, who gave verbal consent to proceed.   Discussed the use of AI scribe software for clinical note transcription with the patient, who gave verbal consent to proceed.  History of Present Illness   Jasmine Richardson is a 63 year old female who presents with symptoms suggestive of a bladder infection. She is here with her husband. She is in wheelchair as she is recommended off bearing on her right foot due to heel infection.   Lower urinary tract symptoms - Spotting on tissue after urination since the other night - Urinary incontinence with recent worsening - Increased urinary frequency over the past few days - No fever or chills - Urine sample showed blood, protein, sugar, and white blood cells  Back pain - Intermittent back pain - Occasionally resolves after urination  Chronic kidney disease and hypertension - Chronic kidney disease - Currently taking antihypertensive medication  Hydration status - Fluid intake questioned by companion - Drinks at least two waters daily  History of foot infection - Previous foot infection after stepping on glass - Required PICC line for intravenous antibiotics - Infection did not reach bone - Completed 14-day course of antibiotics        Past Medical History:  Diagnosis Date   Abnormal EKG 10/23/2021   Abnormal mammogram 08/09/2023   Abnormal mammogram of left breast 09/04/2021   Abnormal stress test 10/23/2021   Abscess of bursa of right foot 06/12/2024   Abscess of left elbow 02/18/2024   Abscess of right foot 06/10/2024   Absolute anemia 09/20/2022   Acquired varus deformity of foot, left    Acute chest pain 05/08/2016   Acute hemorrhagic cystitis  08/09/2023   Acute osteomyelitis of right calcaneus (HCC) 06/15/2024   AICD (automatic cardioverter/defibrillator) present    St Jude/Abbott device   AKI (acute kidney injury) 10/23/2021   Amputation of right great toe 09/20/2022   Angina pectoris 09/11/2017   Anxiety 10/23/2021   B12 deficiency 09/20/2022   Bacteremia 11/22/2021   Breast wound, right, subsequent encounter 09/04/2021   Burping 05/08/2016   Cancer (HCC)    cervical - hysterectomy   Cellulitis of right breast 10/23/2021   Cellulitis of right foot 06/15/2024   Cellulitis, wound, post-operative 10/09/2021   Chronic diastolic heart failure (HCC) 11/06/2016   Chronic osteomyelitis of left foot (HCC) 10/16/2023   Colon cancer screening 09/20/2022   Coronary artery disease involving native coronary artery of native heart with angina pectoris 09/12/2015   Overview:  PCI and stent of RCA 2009, last cath 2012 with medical therapy  CABG May 2017   Demand ischemia [facility])    Diabetes mellitus due to underlying condition with hyperglycemia, with long-term current use of insulin  (HCC) 09/20/2022   Diabetic foot infection (HCC) 11/21/2021   Diabetic foot ulcer (HCC) 02/16/2020   Emphysema lung (HCC) 09/11/2017   patient not aware of this dx   Encounter for preoperative assessment 10/16/2023   Essential hypertension 09/12/2015   Gangrene (HCC) 10/23/2021   GERD (gastroesophageal reflux disease) 05/08/2016   Hematoma 10/23/2021   Hiatal hernia 10/23/2021   History of methicillin resistant staphylococcus aureus (MRSA)    Hyperglycemia 10/23/2021   Hyperglycemia due  to type 2 diabetes mellitus (HCC) 10/23/2021   Hyperkalemia    Hyperlipidemia 09/12/2015   Hyponatremia 10/23/2021   ICD (implantable cardioverter-defibrillator) in place 05/16/2021   Infection of great toe 10/23/2021   Intertrigo 09/20/2022   Lactic acidosis 10/23/2021   Leukocytosis 10/23/2021   Mediastinitis 06/28/2016   Morbid obesity (HCC) 05/08/2016    Myocardial infarction [facility])    Needs flu shot 09/20/2022   NSTEMI (non-ST elevated myocardial infarction) (HCC) 05/22/2020   Obesity (BMI 30-39.9) 10/23/2021   Partial nontraumatic amputation of left foot (HCC) 09/20/2022   Perineal abscess 10/23/2021   Peripheral vascular disease    Right foot infection 06/10/2024   S/P CABG (coronary artery bypass graft) 06/03/2016   Overview:  The patient underwent sternal reconstruction on 06/21/16 with pec flaps for mediastinitis from a prior CABG in May 2017. On admission, she was critically ill from sepsis and had altered mental status. She was last seen in clinic on 08/09/16 at which time she was doing well.   Severe sepsis (HCC) 06/03/2016   Sinus tachycardia 05/08/2016   Tobacco use disorder 04/20/2016   Overview:  Quit in May 2017.   Type 2 diabetes mellitus with foot ulcer (HCC) 05/08/2016   Uncontrolled type 2 diabetes mellitus with hyperglycemia (HCC) 02/18/2024   Unstable angina (HCC) 05/22/2020   Ventricular tachycardia (HCC) 12/17/2020   Vitamin D  deficiency 09/20/2022   Wound, surgical, infected 06/06/2016   Overview:  sternal    Past Surgical History:  Procedure Laterality Date   ABDOMINAL AORTOGRAM W/LOWER EXTREMITY N/A 10/11/2021   Procedure: ABDOMINAL AORTOGRAM W/LOWER EXTREMITY;  Surgeon: Gretta Lonni PARAS, MD;  [facility];  Service: Cardiovascular;  Laterality: N/A;   AMPUTATION TOE Right 02/20/2020   Procedure: AMPUTATION RIGHT GREAT  TOE;  Surgeon: Tobie Franky SQUIBB, DPM;  Location: [facility] OR;  Service: Podiatry;  Laterality: Right;   BLADDER SURGERY     BONE BIOPSY Left 02/01/2022   Procedure: BONE BIOPSY LEFT FOOT, IRRIGATION AND DEBRIDEMENT;  Surgeon: Burt Fus, DPM;  Location: [facility] OR;  Service: Podiatry;  Laterality: Left;   BONE BIOPSY Left 10/31/2023   Procedure: BONE BIOPSY;  Surgeon: Janit Thresa HERO, DPM;  Location: [facility] ORS;  Service: Orthopedics/Podiatry;  Laterality: Left;   BUBBLE STUDY  11/27/2021    Procedure: BUBBLE STUDY;  Surgeon: Okey Vina GAILS, MD;  Location: [facility] ENDOSCOPY;  Service: Cardiovascular;;   CARDIAC CATHETERIZATION     CORONARY ARTERY BYPASS GRAFT     CORONARY STENT INTERVENTION N/A 05/25/2020   Procedure: CORONARY STENT INTERVENTION;  Surgeon: Darron Deatrice LABOR, MD;  [facility];  Service: Cardiovascular;  Laterality: N/A;   CORONARY STENT INTERVENTION N/A 06/22/2020   Procedure: CORONARY STENT INTERVENTION;  Surgeon: Anner Alm ORN, MD;  Location: [facility] INVASIVE CV LAB;  Service: Cardiovascular;  Laterality: N/A;   CORONARY ULTRASOUND/IVUS N/A 06/22/2020   Procedure: Intravascular Ultrasound/IVUS;  Surgeon: Anner Alm ORN, MD;  Location: [facility] INVASIVE CV LAB;  Service: Cardiovascular;  Laterality: N/A;   ICD IMPLANT N/A 12/19/2020   Procedure: ICD IMPLANT;  Surgeon: Waddell Danelle ORN, MD;  Location: [facility] INVASIVE CV LAB;  Service: Cardiovascular;  Laterality: N/A;   INCISION AND DRAINAGE Right 02/19/2020   Procedure: INCISION AND DRAINAGE;  Surgeon: Gershon Donnice SAUNDERS, DPM;  Location: [facility];  Service: Podiatry;  Laterality: Right;  Block done by surgeon   INCISION AND DRAINAGE OF WOUND Left 10/31/2023   Procedure: IRRIGATION AND DEBRIDEMENT WOUND;  Surgeon: Janit Thresa HERO, DPM;  Location: [facility]S;  Service: Orthopedics/Podiatry;  Laterality: Left;   IRRIGATION AND DEBRIDEMENT ABSCESS Right 06/12/2024   Procedure: IRRIGATION AND DEBRIDEMENT ABSCESS;  Surgeon: Malvin Marsa FALCON, DPM;  [facility];  Service: Orthopedics/Podiatry;  Laterality: Right;  I&D R heel, possible bone biopsy   IRRIGATION AND DEBRIDEMENT FOOT Left 10/10/2021   Procedure: IRRIGATION AND DEBRIDEMENT FOOT;  Surgeon: Burt Fus, DPM;  [facility]: [facility];  Service: Podiatry;  Laterality: Left;   IRRIGATION AND DEBRIDEMENT FOOT Left 11/23/2021   Procedure: IRRIGATION AND DEBRIDEMENT FOOT;  Surgeon: Joya Stabs, MD;  [facility]: [facility];  Service: Podiatry;  Laterality: Left;  will do local block    LEFT HEART CATH AND CORS/GRAFTS ANGIOGRAPHY N/A 05/23/2020   Procedure: LEFT HEART CATH AND CORS/GRAFTS ANGIOGRAPHY;  Surgeon: Mady Bruckner, MD;  [facility];  Service: Cardiovascular;  Laterality: N/A;   LEFT HEART CATH AND CORS/GRAFTS ANGIOGRAPHY N/A 12/19/2020   Procedure: LEFT HEART CATH AND CORS/GRAFTS ANGIOGRAPHY;  Surgeon: Verlin Bruckner BIRCH, MD;  [facility];  Service: Cardiovascular;  Laterality: N/A;   LEFT HEART CATH AND CORS/GRAFTS ANGIOGRAPHY N/A 07/21/2021   Procedure: LEFT HEART CATH AND CORS/GRAFTS ANGIOGRAPHY;  Surgeon: Mady Bruckner, MD;  [facility];  Service: Cardiovascular;  Laterality: N/A;   METATARSAL HEAD EXCISION Right 05/02/2020   Procedure: FIRST METATARSAL HEAD RESECTION; RIGHT FOOT WOUND CLOSURE;  Surgeon: Burt Fus, DPM;  Location: [facility];  Service: Podiatry;  Laterality: Right;  MAC W/LOCAL   PERIPHERAL VASCULAR BALLOON ANGIOPLASTY Left 10/11/2021   Procedure: PERIPHERAL VASCULAR BALLOON ANGIOPLASTY;  Surgeon: Gretta Bruckner PARAS, MD;  [facility];  Service: Cardiovascular;  Laterality: Left;  Anterior Tibial Artery   TEE WITHOUT CARDIOVERSION N/A 11/27/2021   Procedure: TRANSESOPHAGEAL ECHOCARDIOGRAM (TEE);  Surgeon: Okey Vina GAILS, MD;  Location: [facility] ENDOSCOPY;  Service: Cardiovascular;  Laterality: N/A;   TIBIALIS TENDON TRANSFER / REPAIR Left 12/06/2023   Procedure: TIBIALIS TENDON TRANSFER;  Surgeon: Janit Thresa HERO, DPM;  Location: [facility] ORS;  Service: Orthopedics/Podiatry;  Laterality: Left;   TRANSMETATARSAL AMPUTATION Left 11/23/2021   Procedure: TRANSMETATARSAL AMPUTATION;  Surgeon: Joya Stabs, MD;  Location: [facility] OR;  Service: Podiatry;  Laterality: Left;   TUBAL LIGATION      Family History  Problem Relation Age of Onset   Hypertension Mother    Hyperlipidemia Mother    Heart attack Father    Heart disease Father    Hypertension Father    Alzheimer's  disease Father    Hypertension Brother    Hyperlipidemia Brother     Social History   Socioeconomic History   Marital status: Married    Spouse name: Not on file   Number of children: Not on file   Years of education: Not on file   Highest education level: Not on file  Occupational History   Not on file  Tobacco Use   Smoking status: Former    Current packs/day: 0.00    Types: Cigarettes    Quit date: 05/2016    Years since quitting: 8.3   Smokeless tobacco: Never  Vaping Use   Vaping status: Never Used  Substance and Sexual Activity   Alcohol use: Yes    Comment: rare   Drug use: No   Sexual activity: Yes    Birth control/protection: Surgical    Comment: Hysterectomy  Other Topics Concern   Not on file  Social History Narrative   Not on file  Social Drivers of Corporate investment banker Strain: Low Risk  (10/16/2023)   Overall Financial Resource Strain (CARDIA)    Difficulty of Paying Living Expenses: Not hard at all  Food Insecurity: No Food Insecurity (06/18/2024)   Hunger Vital Sign    Worried About Running Out of Food in the Last Year: Never true    Ran Out of Food in the Last Year: Never true  Transportation Needs: No Transportation Needs (06/18/2024)   PRAPARE - Administrator, Civil Service (Medical): No    Lack of Transportation (Non-Medical): No  Physical Activity: Inactive (10/16/2023)   Exercise Vital Sign    Days of Exercise per Week: 0 days    Minutes of Exercise per Session: 0 min  Stress: No Stress Concern Present (10/16/2023)   [employer] of Occupational Health - Occupational Stress Questionnaire    Feeling of Stress : Not at all  Social Connections: Socially Integrated (10/16/2023)   Social Connection and Isolation Panel    Frequency of Communication with Friends and Family: More than three times a week    Frequency of Social Gatherings with Friends and Family: More than three times a week    Attends Religious Services:  More than 4 times per year    Active Member of [employer] or Organizations: Yes    Attends Engineer, structural: More than 4 times per year    Marital Status: Married  Catering manager Violence: Not At Risk (06/18/2024)   Humiliation, Afraid, Rape, and Kick questionnaire    Fear of Current or Ex-Partner: No    Emotionally Abused: No    Physically Abused: No    Sexually Abused: No    Outpatient Medications Prior to Visit  Medication Sig Dispense Refill   acetaminophen  (TYLENOL ) 325 MG tablet Take 2 tablets (650 mg total) by mouth every 4 (four) hours as needed for headache or mild pain.     amiodarone  (PACERONE ) 200 MG tablet Take 1 tablet (200 mg total) by mouth daily. Must have appt for refills. 30 tablet 0   aspirin  81 MG chewable tablet Chew 81 mg by mouth daily.     clopidogrel  (PLAVIX ) 75 MG tablet Take 1 tablet (75 mg total) by mouth daily. 90 tablet 3   Continuous Glucose Sensor (DEXCOM G7 SENSOR) MISC Change sensor every 10 days as directed 3 each 12   Evolocumab  (REPATHA  SURECLICK) 140 MG/ML SOAJ Inject 140 mg into the skin every 14 (fourteen) days. 2 mL 1   FARXIGA  10 MG TABS tablet Take 1 tablet by mouth once daily 90 tablet 0   furosemide  (LASIX ) 20 MG tablet Take 1 tablet (20 mg total) by mouth daily. 90 tablet 3   HUMALOG  KWIKPEN 100 UNIT/ML KwikPen Inject 1-20 Units into the skin at bedtime. CBG 70 - 120: 0 units CBG 121 - 150: 3 units CBG 151 - 200: 4 units CBG 201 - 250: 7 units CBG 251 - 300: 11 units CBG 301 - 350: 15 units CBG 351 - 400: 20 units CBG > 400: call MD and obtain STAT lab verification 30 mL 2   insulin  glargine, 1 Unit Dial , (TOUJEO  SOLOSTAR) 300 UNIT/ML Solostar Pen Inject 63 Units into the skin daily. 6 mL 1   Insulin  Pen Needle (INSUPEN PEN NEEDLES) 32G X 4 MM MISC Use as directed once daily with insulin . 100 each 1   ketoconazole  (NIZORAL ) 2 % cream Apply 1 Application topically daily. 30 g 1   metFORMIN  (GLUCOPHAGE )  500 MG tablet TAKE 1 TABLET BY  MOUTH TWICE DAILY WITH A MEAL 180 tablet 0   metoprolol  succinate (TOPROL -XL) 25 MG 24 hr tablet TAKE 1 TABLET BY MOUTH IN THE MORNING, AT NOON AND AT BEDTIME 270 tablet 0   nitroGLYCERIN  (NITROSTAT ) 0.4 MG SL tablet Place 1 tablet (0.4 mg total) under the tongue every 5 (five) minutes as needed for chest pain. 25 tablet 6   nystatin  (MYCOSTATIN /NYSTOP ) powder Apply 1 Application topically daily as needed (rash/yeast). 15 g 2   rosuvastatin  (CRESTOR ) 20 MG tablet Take 1 tablet (20 mg total) by mouth daily. 90 tablet 2   SANTYL 250 UNIT/GM ointment Apply 1 Application topically daily.     valsartan  (DIOVAN ) 80 MG tablet Take 1 tablet (80 mg total) by mouth 2 (two) times daily. 180 tablet 2   Vitamin D , Ergocalciferol , (DRISDOL ) 1.25 MG (50000 UNIT) CAPS capsule TAKE 1 CAPSULE BY MOUTH EVERY SATURDAY 12 capsule 0   amoxicillin -clavulanate (AUGMENTIN ) 875-125 MG tablet Take 1 tablet by mouth 2 (two) times daily. 14 tablet 0   No facility-administered medications prior to visit.    Allergies  Allergen Reactions   Vancomycin  Anaphylaxis   Chlorhexidine  Gluconate Itching    Received CHG bath, began itching, required benadryl    Ativan  [Lorazepam ] Other (See Comments)    hallucinations   Cat Dander Other (See Comments)    Sneezing, watery eyes.   Other Itching    Dial  Soap   Tramadol  Other (See Comments)    Hallucinations   Codeine Rash    Review of Systems  Constitutional:  Negative for chills, fatigue and fever.  HENT:  Negative for congestion, ear pain and sore throat.   Respiratory:  Negative for cough and shortness of breath.   Cardiovascular:  Negative for chest pain and palpitations.  Gastrointestinal:  Negative for abdominal pain, constipation, diarrhea, nausea and vomiting.  Endocrine: Negative for polydipsia, polyphagia and polyuria.  Genitourinary:  Positive for frequency and urgency. Negative for difficulty urinating and dysuria.  Musculoskeletal:  Negative for arthralgias,  back pain and myalgias.  Skin:  Negative for rash.  Neurological:  Negative for headaches.  Psychiatric/Behavioral:  Negative for dysphoric mood. The patient is not nervous/anxious.        Objective:        09/15/2024    4:08 PM 08/21/2024    4:03 PM 07/22/2024    1:08 PM  Vitals with BMI  Height 5' 7 5' 7 5' 7  Weight 214 lbs 217 lbs 13 oz 216 lbs 6 oz  BMI 33.51 34.1 33.88  Systolic 140 122 865  Diastolic 80 60 76  Pulse 67 76 72    No data found.   Physical Exam Vitals and nursing note reviewed.  HENT:     Head: Normocephalic and atraumatic.  Cardiovascular:     Rate and Rhythm: Normal rate and regular rhythm.  Pulmonary:     Effort: Pulmonary effort is normal.     Breath sounds: Normal breath sounds.  Abdominal:     Tenderness: There is abdominal tenderness (mild suprapubic tenderness). There is no right CVA tenderness or left CVA tenderness.  Neurological:     Mental Status: She is oriented to person, place, and time.  Psychiatric:        Mood and Affect: Mood normal.     Health Maintenance Due  Topic Date Due   Medicare Annual Wellness (AWV)  Never done   Mammogram  09/08/2024    There are  no preventive care reminders to display for this patient.   Lab Results  Component Value Date   TSH 3.630 03/13/2024   Lab Results  Component Value Date   WBC 17.3 (H) 06/17/2024   HGB 10.6 (L) 06/17/2024   HCT 33.4 (L) 06/17/2024   MCV 90.0 06/17/2024   PLT 496 (H) 06/17/2024   Lab Results  Component Value Date   NA 138 06/17/2024   K 3.9 06/17/2024   CO2 24 06/17/2024   GLUCOSE 134 (H) 06/17/2024   BUN 15 06/17/2024   CREATININE 1.15 (H) 06/17/2024   BILITOT 0.4 06/17/2024   ALKPHOS 54 06/17/2024   AST 19 06/17/2024   ALT 18 06/17/2024   PROT 7.8 06/17/2024   ALBUMIN 2.4 (L) 06/17/2024   CALCIUM  9.0 06/17/2024   ANIONGAP 12 06/17/2024   EGFR 66 03/13/2024   Lab Results  Component Value Date   CHOL 242 (H) 03/13/2024   Lab Results   Component Value Date   HDL 59 03/13/2024   Lab Results  Component Value Date   LDLCALC 138 (H) 03/13/2024   Lab Results  Component Value Date   TRIG 251 (H) 03/13/2024   Lab Results  Component Value Date   CHOLHDL 4.1 03/13/2024   Lab Results  Component Value Date   HGBA1C 8.9 (H) 06/11/2024        Results for orders placed or performed in visit on 08/24/24  WOUND CULTURE   Collection Time: 08/24/24  4:43 PM  Result Value Ref Range   MICRO NUMBER: [id number]    SPECIMEN QUALITY: Adequate    SOURCE: RT HEEL WOUND    STATUS: FINAL    GRAM STAIN:      No epithelial cells seen No white blood cells seen Moderate Gram positive cocci in pairs Moderate Gram positive bacilli Few Gram negative bacilli   ISOLATE 1: methicillin resistant Staphylococcus aureus (A)       Susceptibility   Methicillin resistant staphylococcus aureus - AEROBIC CULT, GRAM STAIN POSITIVE 1    VANCOMYCIN  1 Sensitive     CIPROFLOXACIN  >=8 Resistant     CLINDAMYCIN  <=0.25 Sensitive     LEVOFLOXACIN >=8 Resistant     ERYTHROMYCIN >=8 Resistant     GENTAMICIN  <=0.5 Sensitive     OXACILLIN* NR Resistant      * Oxacillin-resistant staphylococci are resistant to all currently available beta-lactam antimicrobial agents with the possible exception of ceftaroline.     TETRACYCLINE <=1 Sensitive     TRIMETH /SULFA  >=320 Resistant     MOXIFLOXACIN* 4 Resistant      * Oxacillin-resistant staphylococci are resistant to all currently available beta-lactam antimicrobial agents with the possible exception of ceftaroline. Legend: S = Susceptible  I = Intermediate R = Resistant  NS = Not susceptible SDD = Susceptible Dose Dependent * = Not Tested  NR = Not Reported **NN = See Therapy Comments   House Account Tracking only   Collection Time: 08/24/24  4:43 PM  Result Value Ref Range   Tracking House Account       Assessment & Plan:   Assessment & Plan Dysuria Symptoms include hematuria, increased urinary  frequency, and urinary incontinence. Urinalysis shows blood, protein, sugar, and white blood cells, indicating a possible infection. A urine culture has been sent to confirm the diagnosis and identify the causative organism. She is not currently on antibiotics, despite recent antibiotic use for a foot infection.  - Prescribe Macrobid  (nitrofurantoin ) 100 mg, one pill twice daily for 7  days. - Send urine sample for culture to confirm the diagnosis and guide antibiotic therapy. - Advise to increase fluid intake to help flush out the infection. - Instruct to monitor for worsening symptoms such as fever, chills, or back pain, and seek emergency care if these occur. - Recommend pelvic floor exercises (Kegels) to help with urinary incontinence.  Hypertension Blood pressure recorded at 140/80 mmHg, slightly elevated. She is on antihypertensive medication but was unaware that 140 is considered high. - Recheck blood pressure to confirm the elevated reading WHICH CAME BACK DOWN TO NORMAL.     Orders:   Urine Culture   POCT URINALYSIS DIP (CLINITEK)    Assessment and Plan    Urinary tract infection with urinary incontinence    Body mass index is 33.52 kg/m.SABRA  Assessment and Plan      Meds ordered this encounter  Medications   nitrofurantoin , macrocrystal-monohydrate, (MACROBID ) 100 MG capsule    Sig: Take 1 capsule (100 mg total) by mouth 2 (two) times daily for 7 days.    Dispense:  14 capsule    Refill:  0    No orders of the defined types were placed in this encounter.    Follow-up: No follow-ups on file.  An After Visit Summary was printed and given to the patient.  Volney Reierson, MD [facility] [phone]

## 2024-09-15 NOTE — Patient Instructions (Signed)
  VISIT SUMMARY: You visited us  today with symptoms of a bladder infection, including spotting, increased urinary frequency, and urinary incontinence. We also discussed your back pain, chronic kidney disease, and hypertension.  YOUR PLAN: URINARY TRACT INFECTION WITH URINARY INCONTINENCE: You have symptoms of a bladder infection, including blood in your urine, frequent urination, and urinary incontinence. Your urine test showed signs of infection. -Take Macrobid  (nitrofurantoin ) 100 mg, one pill twice daily for 7 days. -We have sent your urine sample for culture to confirm the infection and guide further treatment. -Increase your fluid intake to help flush out the infection. -Monitor for worsening symptoms such as fever, chills, or back pain, and seek emergency care if these occur. -Perform pelvic floor exercises (Kegels) to help with urinary incontinence.  HYPERTENSION: Your blood pressure was slightly elevated at 140/80 mmHg. -We will recheck your blood pressure to confirm the elevated reading.                      Contains text generated by [employer].                                 Contains text generated by [employer]bridge.

## 2024-09-15 NOTE — Assessment & Plan Note (Signed)
 Symptoms include hematuria, increased urinary frequency, and urinary incontinence. Urinalysis shows blood, protein, sugar, and white blood cells, indicating a possible infection. A urine culture has been sent to confirm the diagnosis and identify the causative organism. She is not currently on antibiotics, despite recent antibiotic use for a foot infection.  - Prescribe Macrobid  (nitrofurantoin ) 100 mg, one pill twice daily for 7 days. - Send urine sample for culture to confirm the diagnosis and guide antibiotic therapy. - Advise to increase fluid intake to help flush out the infection. - Instruct to monitor for worsening symptoms such as fever, chills, or back pain, and seek emergency care if these occur. - Recommend pelvic floor exercises (Kegels) to help with urinary incontinence.  Hypertension Blood pressure recorded at 140/80 mmHg, slightly elevated. She is on antihypertensive medication but was unaware that 140 is considered high. - Recheck blood pressure to confirm the elevated reading WHICH CAME BACK DOWN TO NORMAL.     Orders:   Urine Culture   POCT URINALYSIS DIP (CLINITEK)

## 2024-09-17 ENCOUNTER — Other Ambulatory Visit (HOSPITAL_BASED_OUTPATIENT_CLINIC_OR_DEPARTMENT_OTHER): Payer: Self-pay

## 2024-09-17 ENCOUNTER — Other Ambulatory Visit: Payer: Self-pay

## 2024-09-17 DIAGNOSIS — L89610 Pressure ulcer of right heel, unstageable: Secondary | ICD-10-CM | POA: Diagnosis not present

## 2024-09-17 DIAGNOSIS — L97413 Non-pressure chronic ulcer of right heel and midfoot with necrosis of muscle: Secondary | ICD-10-CM | POA: Diagnosis not present

## 2024-09-17 DIAGNOSIS — E1165 Type 2 diabetes mellitus with hyperglycemia: Secondary | ICD-10-CM

## 2024-09-17 MED ORDER — TOUJEO SOLOSTAR 300 UNIT/ML ~~LOC~~ SOPN
63.0000 [IU] | PEN_INJECTOR | Freq: Every day | SUBCUTANEOUS | 1 refills | Status: AC
  Filled 2024-09-17: qty 18, 84d supply, fill #0
  Filled 2024-12-30: qty 18, 84d supply, fill #1

## 2024-09-17 NOTE — Telephone Encounter (Signed)
 Copied from CRM [id number]. Topic: Clinical - Medication Question >> Sep 17, 2024  1:31 PM Olam RAMAN wrote: Reason for CRM: insulin  glargine, 1 Unit Dial , (TOUJEO  SOLOSTAR) 300 UNIT/ML Solostar Pen Caller stated she had a 90 day supply and only got 30 day at new pharmacy

## 2024-09-18 ENCOUNTER — Other Ambulatory Visit (HOSPITAL_BASED_OUTPATIENT_CLINIC_OR_DEPARTMENT_OTHER): Payer: Self-pay

## 2024-09-18 LAB — URINE CULTURE

## 2024-09-21 ENCOUNTER — Other Ambulatory Visit: Payer: Self-pay

## 2024-09-21 ENCOUNTER — Other Ambulatory Visit (HOSPITAL_BASED_OUTPATIENT_CLINIC_OR_DEPARTMENT_OTHER): Payer: Self-pay

## 2024-09-21 DIAGNOSIS — E1140 Type 2 diabetes mellitus with diabetic neuropathy, unspecified: Secondary | ICD-10-CM | POA: Diagnosis not present

## 2024-09-21 DIAGNOSIS — E785 Hyperlipidemia, unspecified: Secondary | ICD-10-CM | POA: Diagnosis not present

## 2024-09-21 DIAGNOSIS — E538 Deficiency of other specified B group vitamins: Secondary | ICD-10-CM | POA: Diagnosis not present

## 2024-09-21 DIAGNOSIS — E1165 Type 2 diabetes mellitus with hyperglycemia: Secondary | ICD-10-CM | POA: Diagnosis not present

## 2024-09-21 DIAGNOSIS — B951 Streptococcus, group B, as the cause of diseases classified elsewhere: Secondary | ICD-10-CM | POA: Diagnosis not present

## 2024-09-21 DIAGNOSIS — E1169 Type 2 diabetes mellitus with other specified complication: Secondary | ICD-10-CM | POA: Diagnosis not present

## 2024-09-21 DIAGNOSIS — D649 Anemia, unspecified: Secondary | ICD-10-CM | POA: Diagnosis not present

## 2024-09-21 DIAGNOSIS — E559 Vitamin D deficiency, unspecified: Secondary | ICD-10-CM | POA: Diagnosis not present

## 2024-09-21 DIAGNOSIS — F419 Anxiety disorder, unspecified: Secondary | ICD-10-CM | POA: Diagnosis not present

## 2024-09-21 DIAGNOSIS — M21172 Varus deformity, not elsewhere classified, left ankle: Secondary | ICD-10-CM | POA: Diagnosis not present

## 2024-09-21 DIAGNOSIS — I252 Old myocardial infarction: Secondary | ICD-10-CM | POA: Diagnosis not present

## 2024-09-21 DIAGNOSIS — I110 Hypertensive heart disease with heart failure: Secondary | ICD-10-CM | POA: Diagnosis not present

## 2024-09-21 DIAGNOSIS — I472 Ventricular tachycardia, unspecified: Secondary | ICD-10-CM | POA: Diagnosis not present

## 2024-09-21 DIAGNOSIS — S91341D Puncture wound with foreign body, right foot, subsequent encounter: Secondary | ICD-10-CM | POA: Diagnosis not present

## 2024-09-21 DIAGNOSIS — E1151 Type 2 diabetes mellitus with diabetic peripheral angiopathy without gangrene: Secondary | ICD-10-CM | POA: Diagnosis not present

## 2024-09-21 DIAGNOSIS — M86171 Other acute osteomyelitis, right ankle and foot: Secondary | ICD-10-CM | POA: Diagnosis not present

## 2024-09-21 DIAGNOSIS — L02611 Cutaneous abscess of right foot: Secondary | ICD-10-CM | POA: Diagnosis not present

## 2024-09-21 DIAGNOSIS — Z7984 Long term (current) use of oral hypoglycemic drugs: Secondary | ICD-10-CM | POA: Diagnosis not present

## 2024-09-21 DIAGNOSIS — L03115 Cellulitis of right lower limb: Secondary | ICD-10-CM | POA: Diagnosis not present

## 2024-09-21 DIAGNOSIS — I5032 Chronic diastolic (congestive) heart failure: Secondary | ICD-10-CM | POA: Diagnosis not present

## 2024-09-21 DIAGNOSIS — N390 Urinary tract infection, site not specified: Secondary | ICD-10-CM | POA: Diagnosis not present

## 2024-09-21 DIAGNOSIS — M71071 Abscess of bursa, right ankle and foot: Secondary | ICD-10-CM | POA: Diagnosis not present

## 2024-09-21 DIAGNOSIS — I2510 Atherosclerotic heart disease of native coronary artery without angina pectoris: Secondary | ICD-10-CM | POA: Diagnosis not present

## 2024-09-21 DIAGNOSIS — J439 Emphysema, unspecified: Secondary | ICD-10-CM | POA: Diagnosis not present

## 2024-09-22 ENCOUNTER — Other Ambulatory Visit (HOSPITAL_BASED_OUTPATIENT_CLINIC_OR_DEPARTMENT_OTHER): Payer: Self-pay

## 2024-09-22 ENCOUNTER — Ambulatory Visit: Payer: Self-pay

## 2024-09-22 MED ORDER — FLUCONAZOLE 200 MG PO TABS
200.0000 mg | ORAL_TABLET | Freq: Every day | ORAL | 0 refills | Status: DC
Start: 2024-09-22 — End: 2024-09-22
  Filled 2024-09-22: qty 14, 14d supply, fill #0

## 2024-09-22 MED ORDER — FLUCONAZOLE 200 MG PO TABS
200.0000 mg | ORAL_TABLET | Freq: Every day | ORAL | 0 refills | Status: AC
  Filled 2024-09-22: qty 14, 14d supply, fill #0

## 2024-09-23 ENCOUNTER — Telehealth: Payer: Self-pay | Admitting: Cardiology

## 2024-09-23 ENCOUNTER — Ambulatory Visit: Admitting: Podiatry

## 2024-09-23 ENCOUNTER — Other Ambulatory Visit: Payer: Self-pay

## 2024-09-23 NOTE — Telephone Encounter (Signed)
  Pt c/o medication issue:  1. Name of Medication: heart medication   2. How are you currently taking this medication (dosage and times per day)?   3. Are you having a reaction (difficulty breathing--STAT)? No   4. What is your medication issue?  Patient said, she remember Dr. Monetta told her its ok to held her heart medication while taking antibiotics. She wants to confirm if that is right

## 2024-09-23 NOTE — Telephone Encounter (Signed)
 Called the patient and informed her of Dr. Leandrew recommendation below regarding stopping her heart medications:  I would have her continue her current medications and not stop them  Patient verbalized understanding and had no further questions at this time.

## 2024-09-24 DIAGNOSIS — L02611 Cutaneous abscess of right foot: Secondary | ICD-10-CM | POA: Diagnosis not present

## 2024-09-24 DIAGNOSIS — B951 Streptococcus, group B, as the cause of diseases classified elsewhere: Secondary | ICD-10-CM | POA: Diagnosis not present

## 2024-09-24 DIAGNOSIS — E1165 Type 2 diabetes mellitus with hyperglycemia: Secondary | ICD-10-CM | POA: Diagnosis not present

## 2024-09-24 DIAGNOSIS — E785 Hyperlipidemia, unspecified: Secondary | ICD-10-CM | POA: Diagnosis not present

## 2024-09-24 DIAGNOSIS — I472 Ventricular tachycardia, unspecified: Secondary | ICD-10-CM | POA: Diagnosis not present

## 2024-09-24 DIAGNOSIS — E1140 Type 2 diabetes mellitus with diabetic neuropathy, unspecified: Secondary | ICD-10-CM | POA: Diagnosis not present

## 2024-09-24 DIAGNOSIS — M86171 Other acute osteomyelitis, right ankle and foot: Secondary | ICD-10-CM | POA: Diagnosis not present

## 2024-09-24 DIAGNOSIS — I110 Hypertensive heart disease with heart failure: Secondary | ICD-10-CM | POA: Diagnosis not present

## 2024-09-24 DIAGNOSIS — M71071 Abscess of bursa, right ankle and foot: Secondary | ICD-10-CM | POA: Diagnosis not present

## 2024-09-24 DIAGNOSIS — D649 Anemia, unspecified: Secondary | ICD-10-CM | POA: Diagnosis not present

## 2024-09-24 DIAGNOSIS — E538 Deficiency of other specified B group vitamins: Secondary | ICD-10-CM | POA: Diagnosis not present

## 2024-09-24 DIAGNOSIS — Z7984 Long term (current) use of oral hypoglycemic drugs: Secondary | ICD-10-CM | POA: Diagnosis not present

## 2024-09-24 DIAGNOSIS — M21172 Varus deformity, not elsewhere classified, left ankle: Secondary | ICD-10-CM | POA: Diagnosis not present

## 2024-09-24 DIAGNOSIS — E1151 Type 2 diabetes mellitus with diabetic peripheral angiopathy without gangrene: Secondary | ICD-10-CM | POA: Diagnosis not present

## 2024-09-24 DIAGNOSIS — I2510 Atherosclerotic heart disease of native coronary artery without angina pectoris: Secondary | ICD-10-CM | POA: Diagnosis not present

## 2024-09-24 DIAGNOSIS — N390 Urinary tract infection, site not specified: Secondary | ICD-10-CM | POA: Diagnosis not present

## 2024-09-24 DIAGNOSIS — S91341D Puncture wound with foreign body, right foot, subsequent encounter: Secondary | ICD-10-CM | POA: Diagnosis not present

## 2024-09-24 DIAGNOSIS — E559 Vitamin D deficiency, unspecified: Secondary | ICD-10-CM | POA: Diagnosis not present

## 2024-09-24 DIAGNOSIS — J439 Emphysema, unspecified: Secondary | ICD-10-CM | POA: Diagnosis not present

## 2024-09-24 DIAGNOSIS — E1169 Type 2 diabetes mellitus with other specified complication: Secondary | ICD-10-CM | POA: Diagnosis not present

## 2024-09-24 DIAGNOSIS — F419 Anxiety disorder, unspecified: Secondary | ICD-10-CM | POA: Diagnosis not present

## 2024-09-24 DIAGNOSIS — L03115 Cellulitis of right lower limb: Secondary | ICD-10-CM | POA: Diagnosis not present

## 2024-09-24 DIAGNOSIS — I5032 Chronic diastolic (congestive) heart failure: Secondary | ICD-10-CM | POA: Diagnosis not present

## 2024-09-24 DIAGNOSIS — I252 Old myocardial infarction: Secondary | ICD-10-CM | POA: Diagnosis not present

## 2024-09-28 ENCOUNTER — Other Ambulatory Visit (HOSPITAL_BASED_OUTPATIENT_CLINIC_OR_DEPARTMENT_OTHER): Payer: Self-pay

## 2024-09-28 ENCOUNTER — Other Ambulatory Visit: Payer: Self-pay

## 2024-09-28 DIAGNOSIS — L03115 Cellulitis of right lower limb: Secondary | ICD-10-CM | POA: Diagnosis not present

## 2024-09-28 DIAGNOSIS — E559 Vitamin D deficiency, unspecified: Secondary | ICD-10-CM | POA: Diagnosis not present

## 2024-09-28 DIAGNOSIS — L02611 Cutaneous abscess of right foot: Secondary | ICD-10-CM | POA: Diagnosis not present

## 2024-09-28 DIAGNOSIS — M71071 Abscess of bursa, right ankle and foot: Secondary | ICD-10-CM | POA: Diagnosis not present

## 2024-09-28 DIAGNOSIS — E785 Hyperlipidemia, unspecified: Secondary | ICD-10-CM | POA: Diagnosis not present

## 2024-09-28 DIAGNOSIS — N390 Urinary tract infection, site not specified: Secondary | ICD-10-CM | POA: Diagnosis not present

## 2024-09-28 DIAGNOSIS — I472 Ventricular tachycardia, unspecified: Secondary | ICD-10-CM | POA: Diagnosis not present

## 2024-09-28 DIAGNOSIS — Z7984 Long term (current) use of oral hypoglycemic drugs: Secondary | ICD-10-CM | POA: Diagnosis not present

## 2024-09-28 DIAGNOSIS — I2510 Atherosclerotic heart disease of native coronary artery without angina pectoris: Secondary | ICD-10-CM | POA: Diagnosis not present

## 2024-09-28 DIAGNOSIS — J439 Emphysema, unspecified: Secondary | ICD-10-CM | POA: Diagnosis not present

## 2024-09-28 DIAGNOSIS — E538 Deficiency of other specified B group vitamins: Secondary | ICD-10-CM | POA: Diagnosis not present

## 2024-09-28 DIAGNOSIS — M21172 Varus deformity, not elsewhere classified, left ankle: Secondary | ICD-10-CM | POA: Diagnosis not present

## 2024-09-28 DIAGNOSIS — I5032 Chronic diastolic (congestive) heart failure: Secondary | ICD-10-CM | POA: Diagnosis not present

## 2024-09-28 DIAGNOSIS — S91341D Puncture wound with foreign body, right foot, subsequent encounter: Secondary | ICD-10-CM | POA: Diagnosis not present

## 2024-09-28 DIAGNOSIS — B951 Streptococcus, group B, as the cause of diseases classified elsewhere: Secondary | ICD-10-CM | POA: Diagnosis not present

## 2024-09-28 DIAGNOSIS — E1151 Type 2 diabetes mellitus with diabetic peripheral angiopathy without gangrene: Secondary | ICD-10-CM | POA: Diagnosis not present

## 2024-09-28 DIAGNOSIS — I110 Hypertensive heart disease with heart failure: Secondary | ICD-10-CM | POA: Diagnosis not present

## 2024-09-28 DIAGNOSIS — E1169 Type 2 diabetes mellitus with other specified complication: Secondary | ICD-10-CM | POA: Diagnosis not present

## 2024-09-28 DIAGNOSIS — E1140 Type 2 diabetes mellitus with diabetic neuropathy, unspecified: Secondary | ICD-10-CM | POA: Diagnosis not present

## 2024-09-28 DIAGNOSIS — M86171 Other acute osteomyelitis, right ankle and foot: Secondary | ICD-10-CM | POA: Diagnosis not present

## 2024-09-28 DIAGNOSIS — E1165 Type 2 diabetes mellitus with hyperglycemia: Secondary | ICD-10-CM | POA: Diagnosis not present

## 2024-09-28 DIAGNOSIS — I252 Old myocardial infarction: Secondary | ICD-10-CM | POA: Diagnosis not present

## 2024-09-28 DIAGNOSIS — D649 Anemia, unspecified: Secondary | ICD-10-CM | POA: Diagnosis not present

## 2024-09-28 DIAGNOSIS — F419 Anxiety disorder, unspecified: Secondary | ICD-10-CM | POA: Diagnosis not present

## 2024-09-30 ENCOUNTER — Other Ambulatory Visit (HOSPITAL_BASED_OUTPATIENT_CLINIC_OR_DEPARTMENT_OTHER): Payer: Self-pay

## 2024-09-30 ENCOUNTER — Other Ambulatory Visit: Payer: Self-pay | Admitting: Physician Assistant

## 2024-09-30 ENCOUNTER — Other Ambulatory Visit: Payer: Self-pay | Admitting: Cardiology

## 2024-09-30 DIAGNOSIS — Z794 Long term (current) use of insulin: Secondary | ICD-10-CM

## 2024-09-30 DIAGNOSIS — Z95810 Presence of automatic (implantable) cardiac defibrillator: Secondary | ICD-10-CM

## 2024-09-30 MED ORDER — NITROGLYCERIN 0.4 MG SL SUBL
0.4000 mg | SUBLINGUAL_TABLET | SUBLINGUAL | 6 refills | Status: AC | PRN
  Filled 2024-09-30 (×2): qty 25, 8d supply, fill #0

## 2024-09-30 MED ORDER — METFORMIN HCL 500 MG PO TABS
500.0000 mg | ORAL_TABLET | Freq: Two times a day (BID) | ORAL | 0 refills | Status: AC
  Filled 2024-09-30 (×2): qty 180, 90d supply, fill #0

## 2024-09-30 MED ORDER — FARXIGA 10 MG PO TABS
10.0000 mg | ORAL_TABLET | Freq: Every day | ORAL | 0 refills | Status: AC
  Filled 2024-09-30 (×2): qty 90, 90d supply, fill #0

## 2024-09-30 MED ORDER — AMIODARONE HCL 200 MG PO TABS
200.0000 mg | ORAL_TABLET | Freq: Every day | ORAL | 0 refills | Status: DC
  Filled 2024-09-30 (×2): qty 30, 30d supply, fill #0

## 2024-10-01 ENCOUNTER — Other Ambulatory Visit (HOSPITAL_BASED_OUTPATIENT_CLINIC_OR_DEPARTMENT_OTHER): Payer: Self-pay

## 2024-10-01 DIAGNOSIS — L97413 Non-pressure chronic ulcer of right heel and midfoot with necrosis of muscle: Secondary | ICD-10-CM | POA: Diagnosis not present

## 2024-10-01 DIAGNOSIS — L89610 Pressure ulcer of right heel, unstageable: Secondary | ICD-10-CM | POA: Diagnosis not present

## 2024-10-01 MED ORDER — ROSUVASTATIN CALCIUM 20 MG PO TABS
20.0000 mg | ORAL_TABLET | Freq: Every day | ORAL | 2 refills | Status: AC
  Filled 2024-10-01: qty 90, 90d supply, fill #0

## 2024-10-01 MED ORDER — VALSARTAN 80 MG PO TABS
80.0000 mg | ORAL_TABLET | Freq: Two times a day (BID) | ORAL | 2 refills | Status: AC
  Filled 2024-10-01: qty 180, 90d supply, fill #0

## 2024-10-02 ENCOUNTER — Other Ambulatory Visit (HOSPITAL_BASED_OUTPATIENT_CLINIC_OR_DEPARTMENT_OTHER): Payer: Self-pay

## 2024-10-05 ENCOUNTER — Encounter: Payer: Self-pay | Admitting: Podiatry

## 2024-10-05 ENCOUNTER — Ambulatory Visit (INDEPENDENT_AMBULATORY_CARE_PROVIDER_SITE_OTHER)

## 2024-10-05 ENCOUNTER — Ambulatory Visit (INDEPENDENT_AMBULATORY_CARE_PROVIDER_SITE_OTHER): Admitting: Podiatry

## 2024-10-05 VITALS — Ht 67.0 in | Wt 214.0 lb

## 2024-10-05 DIAGNOSIS — L97412 Non-pressure chronic ulcer of right heel and midfoot with fat layer exposed: Secondary | ICD-10-CM

## 2024-10-05 DIAGNOSIS — E08621 Diabetes mellitus due to underlying condition with foot ulcer: Secondary | ICD-10-CM | POA: Diagnosis not present

## 2024-10-05 NOTE — Progress Notes (Signed)
 Chief Complaint  Patient presents with   Diabetic Ulcer    Pt is here to f/u on right heel due to diabetic ulcer, states wound vac was taken off last night due to foul smell and not pumping correctly, she states no pain.    Subjective:  63 y.o. female with PMHx of diabetes mellitus presenting to the office status post irrigation and debridement right plantar heel performed inpatient on 06/12/2024.  Dr. Marolyn Honour.  Currently being managed at the wound care center in [place] and orders are in place for home wound VAC dressing changes throughout the week.   Past Medical History:  Diagnosis Date   Abnormal EKG 10/23/2021   Abnormal mammogram 08/09/2023   Abnormal mammogram of left breast 09/04/2021   Abnormal stress test 10/23/2021   Abscess of bursa of right foot 06/12/2024   Abscess of left elbow 02/18/2024   Abscess of right foot 06/10/2024   Absolute anemia 09/20/2022   Acquired varus deformity of foot, left    Acute chest pain 05/08/2016   Acute hemorrhagic cystitis 08/09/2023   Acute osteomyelitis of right calcaneus (HCC) 06/15/2024   AICD (automatic cardioverter/defibrillator) present    St Jude/Abbott device   AKI (acute kidney injury) 10/23/2021   Amputation of right great toe 09/20/2022   Angina pectoris 09/11/2017   Anxiety 10/23/2021   B12 deficiency 09/20/2022   Bacteremia 11/22/2021   Breast wound, right, subsequent encounter 09/04/2021   Burping 05/08/2016   Cancer (HCC)    cervical - hysterectomy   Cellulitis of right breast 10/23/2021   Cellulitis of right foot 06/15/2024   Cellulitis, wound, post-operative 10/09/2021   Chronic diastolic heart failure (HCC) 11/06/2016   Chronic osteomyelitis of left foot (HCC) 10/16/2023   Colon cancer screening 09/20/2022   Coronary artery disease involving native coronary artery of native heart with angina pectoris 09/12/2015   Overview:  PCI and stent of RCA 2009, last cath 2012 with medical therapy  CABG May  2017   Demand ischemia [facility])    Diabetes mellitus due to underlying condition with hyperglycemia, with long-term current use of insulin  (HCC) 09/20/2022   Diabetic foot infection (HCC) 11/21/2021   Diabetic foot ulcer (HCC) 02/16/2020   Emphysema lung (HCC) 09/11/2017   patient not aware of this dx   Encounter for preoperative assessment 10/16/2023   Essential hypertension 09/12/2015   Gangrene (HCC) 10/23/2021   GERD (gastroesophageal reflux disease) 05/08/2016   Hematoma 10/23/2021   Hiatal hernia 10/23/2021   History of methicillin resistant staphylococcus aureus (MRSA)    Hyperglycemia 10/23/2021   Hyperglycemia due to type 2 diabetes mellitus (HCC) 10/23/2021   Hyperkalemia    Hyperlipidemia 09/12/2015   Hyponatremia 10/23/2021   ICD (implantable cardioverter-defibrillator) in place 05/16/2021   Infection of great toe 10/23/2021   Intertrigo 09/20/2022   Lactic acidosis 10/23/2021   Leukocytosis 10/23/2021   Mediastinitis 06/28/2016   Morbid obesity (HCC) 05/08/2016   Myocardial infarction (HCC)    Needs flu shot 09/20/2022   NSTEMI (non-ST elevated myocardial infarction) (HCC) 05/22/2020   Obesity (BMI 30-39.9) 10/23/2021   Partial nontraumatic amputation of left foot (HCC) 09/20/2022   Perineal abscess 10/23/2021   Peripheral vascular disease    Right foot infection 06/10/2024   S/P CABG (coronary artery bypass graft) 06/03/2016   Overview:  The patient underwent sternal reconstruction on 06/21/16 with pec flaps for mediastinitis from a prior CABG in May 2017. On admission, she was critically ill from sepsis and had altered mental status.  She was last seen in clinic on 08/09/16 at which time she was doing well.   Severe sepsis (HCC) 06/03/2016   Sinus tachycardia 05/08/2016   Tobacco use disorder 04/20/2016   Overview:  Quit in May 2017.   Type 2 diabetes mellitus with foot ulcer (HCC) 05/08/2016   Uncontrolled type 2 diabetes mellitus with hyperglycemia (HCC) 02/18/2024    Unstable angina (HCC) 05/22/2020   Ventricular tachycardia (HCC) 12/17/2020   Vitamin D  deficiency 09/20/2022   Wound, surgical, infected 06/06/2016   Overview:  sternal    Past Surgical History:  Procedure Laterality Date   ABDOMINAL AORTOGRAM W/LOWER EXTREMITY N/A 10/11/2021   Procedure: ABDOMINAL AORTOGRAM W/LOWER EXTREMITY;  Surgeon: Gretta Lonni PARAS, MD;  Location: [facility] IN[facility];  Service: Cardiovascular;  Laterality: N/A;   AMPUTATION TOE Right 02/20/2020   Procedure: AMPUTATION RIGHT GREAT  TOE;  Surgeon: Tobie Franky SQUIBB, DPM;  Location: [facility] OR;  Service: Podiatry;  Laterality: Right;   BLADDER SURGERY     BONE BIOPSY Left 02/01/2022   Procedure: BONE BIOPSY LEFT FOOT, IRRIGATION AND DEBRIDEMENT;  Surgeon: Burt Fus, DPM;  Location: [facility] OR;  Service: Podiatry;  Laterality: Left;   BONE BIOPSY Left 10/31/2023   Procedure: BONE BIOPSY;  Surgeon: Janit Thresa HERO, DPM;  Location: [facility] ORS;  Service: Orthopedics/Podiatry;  Laterality: Left;   BUBBLE STUDY  11/27/2021   Procedure: BUBBLE STUDY;  Surgeon: Okey Vina GAILS, MD;  Location: [facility] ENDOSCOPY;  Service: Cardiovascular;;   CARDIAC CATHETERIZATION     CORONARY ARTERY BYPASS GRAFT     CORONARY STENT INTERVENTION N/A 05/25/2020   Procedure: CORONARY STENT INTERVENTION;  Surgeon: Darron Deatrice LABOR, MD;  Location: [facility];  Service: Cardiovascular;  Laterality: N/A;   CORONARY STENT INTERVENTION N/A 06/22/2020   Procedure: CORONARY STENT INTERVENTION;  Surgeon: Anner Alm ORN, MD;  Location: [facility]VASIVE CV LAB;  Service: Cardiovascular;  Laterality: N/A;   CORONARY ULTRASOUND/IVUS N/A 06/22/2020   Procedure: Intravascular Ultrasound/IVUS;  Surgeon: Anner Alm ORN, MD;  Location: [facility] INVASIVE CV LAB;  Service: Cardiovascular;  Laterality: N/A;   ICD IMPLANT N/A 12/19/2020   Procedure: ICD IMPLANT;  Surgeon: Waddell Danelle ORN, MD;  Location: [facility] INVASIVE CV LAB;  Service: Cardiovascular;  Laterality: N/A;   INCISION AND  DRAINAGE Right 02/19/2020   Procedure: INCISION AND DRAINAGE;  Surgeon: Gershon Donnice SAUNDERS, DPM;  Location: [facility] OR;  Service: Podiatry;  Laterality: Right;  Block done by surgeon   INCISION AND DRAINAGE OF WOUND Left 10/31/2023   Procedure: IRRIGATION AND DEBRIDEMENT WOUND;  Surgeon: Janit Thresa HERO, DPM;  Location: [facility] ORS;  Service: Orthopedics/Podiatry;  Laterality: Left;   IRRIGATION AND DEBRIDEMENT ABSCESS Right 06/12/2024   Procedure: IRRIGATION AND DEBRIDEMENT ABSCESS;  Surgeon: Malvin Marsa FALCON, DPM;  Location: [facility];  Service: Orthopedics/Podiatry;  Laterality: Right;  I&D R heel, possible bone biopsy   IRRIGATION AND DEBRIDEMENT FOOT Left 10/10/2021   Procedure: IRRIGATION AND DEBRIDEMENT FOOT;  Surgeon: Burt Fus, DPM;  Location: [facility];  Service: Podiatry;  Laterality: Left;   IRRIGATION AND DEBRIDEMENT FOOT Left 11/23/2021   Procedure: IRRIGATION AND DEBRIDEMENT FOOT;  Surgeon: Joya Stabs, MD;  Location: [facility] OR;  Service: Podiatry;  Laterality: Left;  will do local block   LEFT HEART CATH AND CORS/GRAFTS ANGIOGRAPHY N/A 05/23/2020   Procedure: LEFT HEART CATH AND CORS/GRAFTS ANGIOGRAPHY;  Surgeon: Mady Lonni, MD;  [facility]: [facility];  Service: Cardiovascular;  Laterality: N/A;   LEFT HEART CATH AND CORS/GRAFTS  ANGIOGRAPHY N/A 12/19/2020   Procedure: LEFT HEART CATH AND CORS/GRAFTS ANGIOGRAPHY;  Surgeon: Verlin Lonni BIRCH, MD;  [facility]: [facility];  Service: Cardiovascular;  Laterality: N/A;   LEFT HEART CATH AND CORS/GRAFTS ANGIOGRAPHY N/A 07/21/2021   Procedure: LEFT HEART CATH AND CORS/GRAFTS ANGIOGRAPHY;  Surgeon: Mady Lonni, MD;  Location: [facility];  Service: Cardiovascular;  Laterality: N/A;   METATARSAL HEAD EXCISION Right 05/02/2020   Procedure: FIRST METATARSAL HEAD RESECTION; RIGHT FOOT WOUND CLOSURE;  Surgeon: Burt Fus, DPM;  Location: [facility];  Service: Podiatry;  Laterality: Right;  MAC W/LOCAL    PERIPHERAL VASCULAR BALLOON ANGIOPLASTY Left 10/11/2021   Procedure: PERIPHERAL VASCULAR BALLOON ANGIOPLASTY;  Surgeon: Gretta Lonni PARAS, MD;  [facility]: [facility];  Service: Cardiovascular;  Laterality: Left;  Anterior Tibial Artery   TEE WITHOUT CARDIOVERSION N/A 11/27/2021   Procedure: TRANSESOPHAGEAL ECHOCARDIOGRAM (TEE);  Surgeon: Okey Vina GAILS, MD;  Location[facility] ENDOSCOPY;  Service: Cardiovascular;  Laterality: N/A;   TIBIALIS TENDON TRANSFER / REPAIR Left 12/06/2023   Procedure: TIBIALIS TENDON TRANSFER;  Surgeon: Janit Thresa HERO, DPM;  Location: [facility] ORS;  Service: Orthopedics/Podiatry;  Laterality: Left;   TRANSMETATARSAL AMPUTATION Left 11/23/2021   Procedure: TRANSMETATARSAL AMPUTATION;  Surgeon: Joya Stabs, MD;  Location: [facility] OR;  Service: Podiatry;  Laterality: Left;   TUBAL LIGATION      Allergies  Allergen Reactions   Vancomycin  Anaphylaxis   Chlorhexidine  Gluconate Itching    Received CHG bath, began itching, required benadryl    Ativan  [Lorazepam ] Other (See Comments)    hallucinations   Cat Dander Other (See Comments)    Sneezing, watery eyes.   Other Itching    Dial  Soap   Tramadol  Other (See Comments)    Hallucinations   Codeine Rash    RT heel 06/30/2024   RT heel 08/24/2024   RT heel 10/05/2024  Objective/Physical Exam General: The patient is alert and oriented x3 in no acute distress.  Dermatology:  Persistent wound  noted to the right plantar heel measuring approximately 3.0 x 3.0 x 0.5 cm (LxWxD).  fibrotic tissue with serous drainage noted.  No exposed bone.  No malodor.  Vascular: Palpable pedal pulses bilaterally. No edema or erythema noted. Capillary refill within normal limits.  Clinically no concern for vascular compromise  Neurological: Light touch and protective threshold diminished bilaterally.   Musculoskeletal Exam: History of TMA LLE w/ TA tendon transfer  Radiographic exam RT foot 10/05/2024: Unchanged since prior x-rays.   No osseous erosions or irregularities concerning for osteomyelitis  Assessment: 1.  Ulcer right plantar heel secondary to diabetes mellitus 2. diabetes mellitus w/ peripheral neuropathy 3. S/p RT heel irrigation and debridement.  DOS: 06/12/2024. inpatient.  Dr. Malvin   Plan of Care:  -Patient was evaluated. - Continue wound VAC dressing changes.  This will be reinitiated tomorrow, 10/06/2024 -The wound care center has recommended surgical skin autograft.  I believe this is a viable option and surgical debridement with possible application of skin graft to help promote the healing of the wound is worth pursuing.  They have their next appointment with the wound care center on Thursday, 10/08/2024 -Return to clinic 6 weeks   Thresa EMERSON Janit, DPM [facility]  Dr. Thresa EMERSON Janit, DPM    [address]  [place], [place] [address]                Office [phone]  Fax [phone]

## 2024-10-06 ENCOUNTER — Other Ambulatory Visit (HOSPITAL_BASED_OUTPATIENT_CLINIC_OR_DEPARTMENT_OTHER): Payer: Self-pay

## 2024-10-06 DIAGNOSIS — S91341D Puncture wound with foreign body, right foot, subsequent encounter: Secondary | ICD-10-CM | POA: Diagnosis not present

## 2024-10-06 DIAGNOSIS — E1151 Type 2 diabetes mellitus with diabetic peripheral angiopathy without gangrene: Secondary | ICD-10-CM | POA: Diagnosis not present

## 2024-10-06 DIAGNOSIS — D649 Anemia, unspecified: Secondary | ICD-10-CM | POA: Diagnosis not present

## 2024-10-06 DIAGNOSIS — J439 Emphysema, unspecified: Secondary | ICD-10-CM | POA: Diagnosis not present

## 2024-10-06 DIAGNOSIS — L03115 Cellulitis of right lower limb: Secondary | ICD-10-CM | POA: Diagnosis not present

## 2024-10-06 DIAGNOSIS — M21172 Varus deformity, not elsewhere classified, left ankle: Secondary | ICD-10-CM | POA: Diagnosis not present

## 2024-10-06 DIAGNOSIS — E785 Hyperlipidemia, unspecified: Secondary | ICD-10-CM | POA: Diagnosis not present

## 2024-10-06 DIAGNOSIS — E1169 Type 2 diabetes mellitus with other specified complication: Secondary | ICD-10-CM | POA: Diagnosis not present

## 2024-10-06 DIAGNOSIS — L02611 Cutaneous abscess of right foot: Secondary | ICD-10-CM | POA: Diagnosis not present

## 2024-10-06 DIAGNOSIS — M71071 Abscess of bursa, right ankle and foot: Secondary | ICD-10-CM | POA: Diagnosis not present

## 2024-10-06 DIAGNOSIS — Z7984 Long term (current) use of oral hypoglycemic drugs: Secondary | ICD-10-CM | POA: Diagnosis not present

## 2024-10-06 DIAGNOSIS — F419 Anxiety disorder, unspecified: Secondary | ICD-10-CM | POA: Diagnosis not present

## 2024-10-06 DIAGNOSIS — E1165 Type 2 diabetes mellitus with hyperglycemia: Secondary | ICD-10-CM | POA: Diagnosis not present

## 2024-10-06 DIAGNOSIS — B951 Streptococcus, group B, as the cause of diseases classified elsewhere: Secondary | ICD-10-CM | POA: Diagnosis not present

## 2024-10-06 DIAGNOSIS — I472 Ventricular tachycardia, unspecified: Secondary | ICD-10-CM | POA: Diagnosis not present

## 2024-10-06 DIAGNOSIS — M86171 Other acute osteomyelitis, right ankle and foot: Secondary | ICD-10-CM | POA: Diagnosis not present

## 2024-10-06 DIAGNOSIS — I110 Hypertensive heart disease with heart failure: Secondary | ICD-10-CM | POA: Diagnosis not present

## 2024-10-06 DIAGNOSIS — E538 Deficiency of other specified B group vitamins: Secondary | ICD-10-CM | POA: Diagnosis not present

## 2024-10-06 DIAGNOSIS — E559 Vitamin D deficiency, unspecified: Secondary | ICD-10-CM | POA: Diagnosis not present

## 2024-10-06 DIAGNOSIS — N390 Urinary tract infection, site not specified: Secondary | ICD-10-CM | POA: Diagnosis not present

## 2024-10-06 DIAGNOSIS — I5032 Chronic diastolic (congestive) heart failure: Secondary | ICD-10-CM | POA: Diagnosis not present

## 2024-10-06 DIAGNOSIS — E1140 Type 2 diabetes mellitus with diabetic neuropathy, unspecified: Secondary | ICD-10-CM | POA: Diagnosis not present

## 2024-10-06 DIAGNOSIS — I252 Old myocardial infarction: Secondary | ICD-10-CM | POA: Diagnosis not present

## 2024-10-06 DIAGNOSIS — I2510 Atherosclerotic heart disease of native coronary artery without angina pectoris: Secondary | ICD-10-CM | POA: Diagnosis not present

## 2024-10-08 DIAGNOSIS — L89610 Pressure ulcer of right heel, unstageable: Secondary | ICD-10-CM | POA: Diagnosis not present

## 2024-10-08 DIAGNOSIS — L97413 Non-pressure chronic ulcer of right heel and midfoot with necrosis of muscle: Secondary | ICD-10-CM | POA: Diagnosis not present

## 2024-10-12 ENCOUNTER — Other Ambulatory Visit (HOSPITAL_BASED_OUTPATIENT_CLINIC_OR_DEPARTMENT_OTHER): Payer: Self-pay

## 2024-10-12 DIAGNOSIS — M71071 Abscess of bursa, right ankle and foot: Secondary | ICD-10-CM | POA: Diagnosis not present

## 2024-10-12 DIAGNOSIS — N390 Urinary tract infection, site not specified: Secondary | ICD-10-CM | POA: Diagnosis not present

## 2024-10-12 DIAGNOSIS — E559 Vitamin D deficiency, unspecified: Secondary | ICD-10-CM | POA: Diagnosis not present

## 2024-10-12 DIAGNOSIS — I472 Ventricular tachycardia, unspecified: Secondary | ICD-10-CM | POA: Diagnosis not present

## 2024-10-12 DIAGNOSIS — M21172 Varus deformity, not elsewhere classified, left ankle: Secondary | ICD-10-CM | POA: Diagnosis not present

## 2024-10-12 DIAGNOSIS — E1140 Type 2 diabetes mellitus with diabetic neuropathy, unspecified: Secondary | ICD-10-CM | POA: Diagnosis not present

## 2024-10-12 DIAGNOSIS — E785 Hyperlipidemia, unspecified: Secondary | ICD-10-CM | POA: Diagnosis not present

## 2024-10-12 DIAGNOSIS — I252 Old myocardial infarction: Secondary | ICD-10-CM | POA: Diagnosis not present

## 2024-10-12 DIAGNOSIS — E538 Deficiency of other specified B group vitamins: Secondary | ICD-10-CM | POA: Diagnosis not present

## 2024-10-12 DIAGNOSIS — D649 Anemia, unspecified: Secondary | ICD-10-CM | POA: Diagnosis not present

## 2024-10-12 DIAGNOSIS — E1169 Type 2 diabetes mellitus with other specified complication: Secondary | ICD-10-CM | POA: Diagnosis not present

## 2024-10-12 DIAGNOSIS — I5032 Chronic diastolic (congestive) heart failure: Secondary | ICD-10-CM | POA: Diagnosis not present

## 2024-10-12 DIAGNOSIS — B951 Streptococcus, group B, as the cause of diseases classified elsewhere: Secondary | ICD-10-CM | POA: Diagnosis not present

## 2024-10-12 DIAGNOSIS — L03115 Cellulitis of right lower limb: Secondary | ICD-10-CM | POA: Diagnosis not present

## 2024-10-12 DIAGNOSIS — J439 Emphysema, unspecified: Secondary | ICD-10-CM | POA: Diagnosis not present

## 2024-10-12 DIAGNOSIS — I2510 Atherosclerotic heart disease of native coronary artery without angina pectoris: Secondary | ICD-10-CM | POA: Diagnosis not present

## 2024-10-12 DIAGNOSIS — I110 Hypertensive heart disease with heart failure: Secondary | ICD-10-CM | POA: Diagnosis not present

## 2024-10-12 DIAGNOSIS — L02611 Cutaneous abscess of right foot: Secondary | ICD-10-CM | POA: Diagnosis not present

## 2024-10-12 DIAGNOSIS — M86171 Other acute osteomyelitis, right ankle and foot: Secondary | ICD-10-CM | POA: Diagnosis not present

## 2024-10-12 DIAGNOSIS — Z7984 Long term (current) use of oral hypoglycemic drugs: Secondary | ICD-10-CM | POA: Diagnosis not present

## 2024-10-12 DIAGNOSIS — S91341D Puncture wound with foreign body, right foot, subsequent encounter: Secondary | ICD-10-CM | POA: Diagnosis not present

## 2024-10-12 DIAGNOSIS — F419 Anxiety disorder, unspecified: Secondary | ICD-10-CM | POA: Diagnosis not present

## 2024-10-12 DIAGNOSIS — E1151 Type 2 diabetes mellitus with diabetic peripheral angiopathy without gangrene: Secondary | ICD-10-CM | POA: Diagnosis not present

## 2024-10-12 DIAGNOSIS — E1165 Type 2 diabetes mellitus with hyperglycemia: Secondary | ICD-10-CM | POA: Diagnosis not present

## 2024-10-14 ENCOUNTER — Other Ambulatory Visit (HOSPITAL_COMMUNITY): Payer: Self-pay

## 2024-10-15 DIAGNOSIS — L97413 Non-pressure chronic ulcer of right heel and midfoot with necrosis of muscle: Secondary | ICD-10-CM | POA: Diagnosis not present

## 2024-10-15 DIAGNOSIS — L89610 Pressure ulcer of right heel, unstageable: Secondary | ICD-10-CM | POA: Diagnosis not present

## 2024-10-15 NOTE — Progress Notes (Signed)
   10/15/2024  [name], female   DOB: Dec 01, 1961, 63 y.o.   MRN: [id number]  Pharmacy Quality Measure Review  This patient is appearing on a report for being at risk of failing the Glycemic Status Assessment in Diabetes measure this calendar year.   Noted A1c <8 and KED's uACR and eGFR documented for 2025 measurement year. No action at this time.   Lang Sieve, PharmD, BCGP Clinical Pharmacist  [phone]

## 2024-10-19 ENCOUNTER — Other Ambulatory Visit (HOSPITAL_BASED_OUTPATIENT_CLINIC_OR_DEPARTMENT_OTHER): Payer: Self-pay

## 2024-10-19 DIAGNOSIS — E1169 Type 2 diabetes mellitus with other specified complication: Secondary | ICD-10-CM | POA: Diagnosis not present

## 2024-10-19 DIAGNOSIS — E785 Hyperlipidemia, unspecified: Secondary | ICD-10-CM | POA: Diagnosis not present

## 2024-10-19 DIAGNOSIS — I110 Hypertensive heart disease with heart failure: Secondary | ICD-10-CM | POA: Diagnosis not present

## 2024-10-19 DIAGNOSIS — I472 Ventricular tachycardia, unspecified: Secondary | ICD-10-CM | POA: Diagnosis not present

## 2024-10-19 DIAGNOSIS — E538 Deficiency of other specified B group vitamins: Secondary | ICD-10-CM | POA: Diagnosis not present

## 2024-10-19 DIAGNOSIS — N390 Urinary tract infection, site not specified: Secondary | ICD-10-CM | POA: Diagnosis not present

## 2024-10-19 DIAGNOSIS — L02611 Cutaneous abscess of right foot: Secondary | ICD-10-CM | POA: Diagnosis not present

## 2024-10-19 DIAGNOSIS — M71071 Abscess of bursa, right ankle and foot: Secondary | ICD-10-CM | POA: Diagnosis not present

## 2024-10-19 DIAGNOSIS — I252 Old myocardial infarction: Secondary | ICD-10-CM | POA: Diagnosis not present

## 2024-10-19 DIAGNOSIS — S91341D Puncture wound with foreign body, right foot, subsequent encounter: Secondary | ICD-10-CM | POA: Diagnosis not present

## 2024-10-19 DIAGNOSIS — J439 Emphysema, unspecified: Secondary | ICD-10-CM | POA: Diagnosis not present

## 2024-10-19 DIAGNOSIS — E559 Vitamin D deficiency, unspecified: Secondary | ICD-10-CM | POA: Diagnosis not present

## 2024-10-19 DIAGNOSIS — M21172 Varus deformity, not elsewhere classified, left ankle: Secondary | ICD-10-CM | POA: Diagnosis not present

## 2024-10-19 DIAGNOSIS — I5032 Chronic diastolic (congestive) heart failure: Secondary | ICD-10-CM | POA: Diagnosis not present

## 2024-10-19 DIAGNOSIS — E1165 Type 2 diabetes mellitus with hyperglycemia: Secondary | ICD-10-CM | POA: Diagnosis not present

## 2024-10-19 DIAGNOSIS — B951 Streptococcus, group B, as the cause of diseases classified elsewhere: Secondary | ICD-10-CM | POA: Diagnosis not present

## 2024-10-19 DIAGNOSIS — D649 Anemia, unspecified: Secondary | ICD-10-CM | POA: Diagnosis not present

## 2024-10-19 DIAGNOSIS — M86171 Other acute osteomyelitis, right ankle and foot: Secondary | ICD-10-CM | POA: Diagnosis not present

## 2024-10-19 DIAGNOSIS — Z7984 Long term (current) use of oral hypoglycemic drugs: Secondary | ICD-10-CM | POA: Diagnosis not present

## 2024-10-19 DIAGNOSIS — E1140 Type 2 diabetes mellitus with diabetic neuropathy, unspecified: Secondary | ICD-10-CM | POA: Diagnosis not present

## 2024-10-19 DIAGNOSIS — F419 Anxiety disorder, unspecified: Secondary | ICD-10-CM | POA: Diagnosis not present

## 2024-10-19 DIAGNOSIS — E1151 Type 2 diabetes mellitus with diabetic peripheral angiopathy without gangrene: Secondary | ICD-10-CM | POA: Diagnosis not present

## 2024-10-19 DIAGNOSIS — L03115 Cellulitis of right lower limb: Secondary | ICD-10-CM | POA: Diagnosis not present

## 2024-10-19 DIAGNOSIS — I2510 Atherosclerotic heart disease of native coronary artery without angina pectoris: Secondary | ICD-10-CM | POA: Diagnosis not present

## 2024-10-20 ENCOUNTER — Other Ambulatory Visit (HOSPITAL_BASED_OUTPATIENT_CLINIC_OR_DEPARTMENT_OTHER): Payer: Self-pay

## 2024-10-21 ENCOUNTER — Other Ambulatory Visit (HOSPITAL_BASED_OUTPATIENT_CLINIC_OR_DEPARTMENT_OTHER): Payer: Self-pay

## 2024-10-22 DIAGNOSIS — L97413 Non-pressure chronic ulcer of right heel and midfoot with necrosis of muscle: Secondary | ICD-10-CM | POA: Diagnosis not present

## 2024-10-22 DIAGNOSIS — L89610 Pressure ulcer of right heel, unstageable: Secondary | ICD-10-CM | POA: Diagnosis not present

## 2024-10-23 ENCOUNTER — Other Ambulatory Visit (HOSPITAL_BASED_OUTPATIENT_CLINIC_OR_DEPARTMENT_OTHER): Payer: Self-pay

## 2024-10-26 DIAGNOSIS — L02611 Cutaneous abscess of right foot: Secondary | ICD-10-CM | POA: Diagnosis not present

## 2024-10-26 DIAGNOSIS — E1165 Type 2 diabetes mellitus with hyperglycemia: Secondary | ICD-10-CM | POA: Diagnosis not present

## 2024-10-26 DIAGNOSIS — E1169 Type 2 diabetes mellitus with other specified complication: Secondary | ICD-10-CM | POA: Diagnosis not present

## 2024-10-26 DIAGNOSIS — M21172 Varus deformity, not elsewhere classified, left ankle: Secondary | ICD-10-CM | POA: Diagnosis not present

## 2024-10-26 DIAGNOSIS — L03115 Cellulitis of right lower limb: Secondary | ICD-10-CM | POA: Diagnosis not present

## 2024-10-26 DIAGNOSIS — D649 Anemia, unspecified: Secondary | ICD-10-CM | POA: Diagnosis not present

## 2024-10-26 DIAGNOSIS — I5032 Chronic diastolic (congestive) heart failure: Secondary | ICD-10-CM | POA: Diagnosis not present

## 2024-10-26 DIAGNOSIS — E1151 Type 2 diabetes mellitus with diabetic peripheral angiopathy without gangrene: Secondary | ICD-10-CM | POA: Diagnosis not present

## 2024-10-26 DIAGNOSIS — F419 Anxiety disorder, unspecified: Secondary | ICD-10-CM | POA: Diagnosis not present

## 2024-10-26 DIAGNOSIS — N390 Urinary tract infection, site not specified: Secondary | ICD-10-CM | POA: Diagnosis not present

## 2024-10-26 DIAGNOSIS — I472 Ventricular tachycardia, unspecified: Secondary | ICD-10-CM | POA: Diagnosis not present

## 2024-10-26 DIAGNOSIS — I110 Hypertensive heart disease with heart failure: Secondary | ICD-10-CM | POA: Diagnosis not present

## 2024-10-26 DIAGNOSIS — M86171 Other acute osteomyelitis, right ankle and foot: Secondary | ICD-10-CM | POA: Diagnosis not present

## 2024-10-26 DIAGNOSIS — M71071 Abscess of bursa, right ankle and foot: Secondary | ICD-10-CM | POA: Diagnosis not present

## 2024-10-26 DIAGNOSIS — B951 Streptococcus, group B, as the cause of diseases classified elsewhere: Secondary | ICD-10-CM | POA: Diagnosis not present

## 2024-10-26 DIAGNOSIS — I2510 Atherosclerotic heart disease of native coronary artery without angina pectoris: Secondary | ICD-10-CM | POA: Diagnosis not present

## 2024-10-26 DIAGNOSIS — E538 Deficiency of other specified B group vitamins: Secondary | ICD-10-CM | POA: Diagnosis not present

## 2024-10-26 DIAGNOSIS — S91341D Puncture wound with foreign body, right foot, subsequent encounter: Secondary | ICD-10-CM | POA: Diagnosis not present

## 2024-10-26 DIAGNOSIS — J439 Emphysema, unspecified: Secondary | ICD-10-CM | POA: Diagnosis not present

## 2024-10-26 DIAGNOSIS — E559 Vitamin D deficiency, unspecified: Secondary | ICD-10-CM | POA: Diagnosis not present

## 2024-10-26 DIAGNOSIS — E785 Hyperlipidemia, unspecified: Secondary | ICD-10-CM | POA: Diagnosis not present

## 2024-10-26 DIAGNOSIS — I252 Old myocardial infarction: Secondary | ICD-10-CM | POA: Diagnosis not present

## 2024-10-26 DIAGNOSIS — E1140 Type 2 diabetes mellitus with diabetic neuropathy, unspecified: Secondary | ICD-10-CM | POA: Diagnosis not present

## 2024-10-26 DIAGNOSIS — Z7984 Long term (current) use of oral hypoglycemic drugs: Secondary | ICD-10-CM | POA: Diagnosis not present

## 2024-10-27 ENCOUNTER — Ambulatory Visit: Admitting: Physician Assistant

## 2024-10-28 ENCOUNTER — Ambulatory Visit: Admitting: Family Medicine

## 2024-10-28 VITALS — BP 122/68 | HR 73 | Temp 98.2°F | Ht 67.0 in | Wt 219.0 lb

## 2024-10-28 DIAGNOSIS — R300 Dysuria: Secondary | ICD-10-CM | POA: Diagnosis not present

## 2024-10-28 DIAGNOSIS — N3001 Acute cystitis with hematuria: Secondary | ICD-10-CM | POA: Diagnosis not present

## 2024-10-28 DIAGNOSIS — E1165 Type 2 diabetes mellitus with hyperglycemia: Secondary | ICD-10-CM

## 2024-10-28 DIAGNOSIS — Z794 Long term (current) use of insulin: Secondary | ICD-10-CM

## 2024-10-28 LAB — POCT URINALYSIS DIP (CLINITEK)
Bilirubin, UA: NEGATIVE
Glucose, UA: >=1000 mg/dL — AB
Ketones, POC UA: NEGATIVE mg/dL
Nitrite, UA: POSITIVE — AB
POC PROTEIN,UA: 100 — AB
Spec Grav, UA: 1.015 (ref 1.010–1.025)
Urobilinogen, UA: 0.2 U/dL
pH, UA: 6 (ref 5.0–8.0)

## 2024-10-28 NOTE — Assessment & Plan Note (Signed)
 Type 2 diabetes mellitus uncontrolled with hyperglycemia with current long term use of insulin .,  A1c was 8.9 on June 26th, indicating suboptimal control. Current medications include Toujeo , metformin , and Farxiga . Farxiga  may contribute to recurrent yeast infections due to its mechanism of action. Need to reassess diabetes management if Farxiga  is discontinued. - Ordered A1c test during next week's lab visit. - Will consider alternative diabetes medications if Farxiga  is discontinued.

## 2024-10-28 NOTE — Assessment & Plan Note (Signed)
 As above. Decision to wait for cultures before empirically treating.

## 2024-10-28 NOTE — Patient Instructions (Signed)
  VISIT SUMMARY: You visited us  today due to symptoms of a urinary tract infection, including back pain, frequent urination, and mild burning during urination. We discussed your diabetes management and concerns about recurrent urinary tract infections, particularly yeast infections, and potential medication interactions.  YOUR PLAN: URINARY TRACT INFECTION: You have symptoms of a urinary tract infection, and we are waiting for culture results to determine the cause. -We sent a urine sample for culture to identify the cause of the infection. -Please report any high fevers or severe symptoms immediately. -We will wait for the culture results before starting any treatment to avoid unnecessary antibiotics and potential interactions.  RECURRENT URINARY YEAST INFECTIONS: You have a history of recurrent yeast infections, which may be worsened by your diabetes and the medication Farxiga . -We will discuss with your cardiologist about managing the yeast infection and potential interactions with amiodarone . -If the yeast infection recurs, we may consider stopping Farxiga . -You are advised to take probiotic pills to help clear the infection.  TYPE 2 DIABETES MELLITUS: Your recent A1c level indicates that your diabetes is not well controlled. -We ordered an A1c test for your next lab visit. -We will consider alternative diabetes medications if we need to stop Farxiga .                      Contains text generated by [employer]bridge.                                 Contains text generated by Abridge.

## 2024-10-28 NOTE — Progress Notes (Signed)
 Acute Office Visit  Subjective:    [name], female    DOB: 1961/04/02, 63 y.o.   MRN: [id number]  Chief Complaint  Patient presents with   Urinary Tract Infection    States she has urinary frequency and lower back pain x 1 week.    HPI: Discussed the use of AI scribe software for clinical note transcription with the patient, who gave verbal consent to proceed.  History of Present Illness   A 63 year old female with diabetes and recurrent urinary tract infections presents with symptoms of a urinary tract infection.  Urinary tract symptoms - Back pain, frequent urination, and mild burning during urination for approximately one week - Symptoms are similar to previous urinary tract infections - No lower abdominal pain - No vaginal itching  Diabetes mellitus - Diabetes mellitus with recent hemoglobin A1c of 8.0 - Current medications: Toujeo  and metformin  500 mg twice daily and FARXIGA   Recurrent urinary tract infections and candiduria - History of recurrent urinary tract infections - Previous urine test on September 30th showed yeast rather than bacterial infection - Has not taken antibiotics since last visit - Has not started fluconazole  due to concerns about interaction with amiodarone   Medication concerns - Current medications include amiodarone  for heart rhythm management - Concerned about potential drug interactions, specifically between fluconazole  and amiodarone  - Concerned about impact of medications on recurrent infections         Past Medical History:  Diagnosis Date   Abnormal EKG 10/23/2021   Abnormal mammogram 08/09/2023   Abnormal mammogram of left breast 09/04/2021   Abnormal stress test 10/23/2021   Abscess of bursa of right foot 06/12/2024   Abscess of left elbow 02/18/2024   Abscess of right foot 06/10/2024   Absolute anemia 09/20/2022   Acquired varus deformity of foot, left    Acute chest pain 05/08/2016   Acute hemorrhagic  cystitis 08/09/2023   Acute osteomyelitis of right calcaneus (HCC) 06/15/2024   AICD (automatic cardioverter/defibrillator) present    St Jude/Abbott device   AKI (acute kidney injury) 10/23/2021   Amputation of right great toe 09/20/2022   Angina pectoris 09/11/2017   Anxiety 10/23/2021   B12 deficiency 09/20/2022   Bacteremia 11/22/2021   Breast wound, right, subsequent encounter 09/04/2021   Burping 05/08/2016   Cancer (HCC)    cervical - hysterectomy   Cellulitis of right breast 10/23/2021   Cellulitis of right foot 06/15/2024   Cellulitis, wound, post-operative 10/09/2021   Chronic diastolic heart failure (HCC) 11/06/2016   Chronic osteomyelitis of left foot (HCC) 10/16/2023   Colon cancer screening 09/20/2022   Coronary artery disease involving native coronary artery of native heart with angina pectoris 09/12/2015   Overview:  PCI and stent of RCA 2009, last cath 2012 with medical therapy  CABG May 2017   Demand ischemia [facility])    Diabetes mellitus due to underlying condition with hyperglycemia, with long-term current use of insulin  (HCC) 09/20/2022   Diabetic foot infection (HCC) 11/21/2021   Diabetic foot ulcer (HCC) 02/16/2020   Emphysema lung (HCC) 09/11/2017   patient not aware of this dx   Encounter for preoperative assessment 10/16/2023   Essential hypertension 09/12/2015   Gangrene (HCC) 10/23/2021   GERD (gastroesophageal reflux disease) 05/08/2016   Hematoma 10/23/2021   Hiatal hernia 10/23/2021   History of methicillin resistant staphylococcus aureus (MRSA)    Hyperglycemia 10/23/2021   Hyperglycemia due to type 2 diabetes mellitus (HCC) 10/23/2021   Hyperkalemia  Hyperlipidemia 09/12/2015   Hyponatremia 10/23/2021   ICD (implantable cardioverter-defibrillator) in place 05/16/2021   Infection of great toe 10/23/2021   Intertrigo 09/20/2022   Lactic acidosis 10/23/2021   Leukocytosis 10/23/2021   Mediastinitis 06/28/2016   Morbid obesity (HCC) 05/08/2016    Myocardial infarction [facility])    Needs flu shot 09/20/2022   NSTEMI (non-ST elevated myocardial infarction) (HCC) 05/22/2020   Obesity (BMI 30-39.9) 10/23/2021   Partial nontraumatic amputation of left foot (HCC) 09/20/2022   Perineal abscess 10/23/2021   Peripheral vascular disease    Right foot infection 06/10/2024   S/P CABG (coronary artery bypass graft) 06/03/2016   Overview:  The patient underwent sternal reconstruction on 06/21/16 with pec flaps for mediastinitis from a prior CABG in May 2017. On admission, she was critically ill from sepsis and had altered mental status. She was last seen in clinic on 08/09/16 at which time she was doing well.   Severe sepsis (HCC) 06/03/2016   Sinus tachycardia 05/08/2016   Tobacco use disorder 04/20/2016   Overview:  Quit in May 2017.   Type 2 diabetes mellitus with foot ulcer (HCC) 05/08/2016   Uncontrolled type 2 diabetes mellitus with hyperglycemia (HCC) 02/18/2024   Unstable angina (HCC) 05/22/2020   Ventricular tachycardia (HCC) 12/17/2020   Vitamin D  deficiency 09/20/2022   Wound, surgical, infected 06/06/2016   Overview:  sternal    Past Surgical History:  Procedure Laterality Date   ABDOMINAL AORTOGRAM W/LOWER EXTREMITY N/A 10/11/2021   Procedure: ABDOMINAL AORTOGRAM W/LOWER EXTREMITY;  Surgeon: Gretta Lonni PARAS, MD;  [facility]: [facility] INVAS[facility] CV LAB;  Service: Cardiovascular;  Laterality: N/A;   AMPUTATION TOE Right 02/20/2020   Procedure: AMPUTATION RIGHT GREAT  TOE;  Surgeon: Tobie Franky SQUIBB, DPM;  Location: [facility] OR;  Service: Podiatry;  Laterality: Right;   BLADDER SURGERY     BONE BIOPSY Left 02/01/2022   Procedure: BONE BIOPSY LEFT FOOT, IRRIGATION AND DEBRIDEMENT;  Surgeon: Burt Fus, DPM;  Location: [facility] OR;  Service: Podiatry;  Laterality: Left;   BONE BIOPSY Left 10/31/2023   Procedure: BONE BIOPSY;  Surgeon: Janit Thresa HERO, DPM;  Location: [facility] ORS;  Service: Orthopedics/Podiatry;  Laterality: Left;   BUBBLE STUDY  11/27/2021    Procedure: BUBBLE STUDY;  Surgeon: Okey Vina GAILS, MD;  Location[facility] ENDOSCOPY;  Service: Cardiovascular;;   CARDIAC CATHETERIZATION     CORONARY ARTERY BYPASS GRAFT     CORONARY STENT INTERVENTION N/A 05/25/2020   Procedure: CORONARY STENT INTERVENTION;  Surgeon: Darron Deatrice LABOR, MD;  [facility]: [facility];  Service: Cardiovascular;  Laterality: N/A;   CORONARY STENT INTERVENTION N/A 06/22/2020   Procedure: CORONARY STENT INTERVENTION;  Surgeon: Anner Alm ORN, MD;  Location: [facility] INVASIVE CV LAB;  Service: Cardiovascular;  Laterality: N/A;   CORONARY ULTRASOUND/IVUS N/A 06/22/2020   Procedure: Intravascular Ultrasound/IVUS;  Surgeon: Anner Alm ORN, MD;  Location: [facility] INVASIVE CV LAB;  Service: Cardiovascular;  Laterality: N/A;   ICD IMPLANT N/A 12/19/2020   Procedure: ICD IMPLANT;  Surgeon: Waddell Danelle ORN, MD;  Location: [facility] INVASIVE CV LAB;  Service: Cardiovascular;  Laterality: N/A;   INCISION AND DRAINAGE Right 02/19/2020   Procedure: INCISION AND DRAINAGE;  Surgeon: Gershon Donnice SAUNDERS, DPM;  Location: [facility];  Service: Podiatry;  Laterality: Right;  Block done by surgeon   INCISION AND DRAINAGE OF WOUND Left 10/31/2023   Procedure: IRRIGATION AND DEBRIDEMENT WOUND;  Surgeon: Janit Thresa HERO, DPM;  Location: [facility] ORS;  Service: Orthopedics/Podiatry;  Laterality: Left;   IRRIGATION  AND DEBRIDEMENT ABSCESS Right 06/12/2024   Procedure: IRRIGATION AND DEBRIDEMENT ABSCESS;  Surgeon: Malvin Marsa FALCON, DPM;  Location: [facility];  Service: Orthopedics/Podiatry;  Laterality: Right;  I&D R heel, possible bone biopsy   IRRIGATION AND DEBRIDEMENT FOOT Left 10/10/2021   Procedure: IRRIGATION AND DEBRIDEMENT FOOT;  Surgeon: Burt Fus, DPM;  [facility]: [facility];  Service: Podiatry;  Laterality: Left;   IRRIGATION AND DEBRIDEMENT FOOT Left 11/23/2021   Procedure: IRRIGATION AND DEBRIDEMENT FOOT;  Surgeon: Joya Stabs, MD;  Location: [facility];  Service: Podiatry;  Laterality: Left;  will do local block    LEFT HEART CATH AND CORS/GRAFTS ANGIOGRAPHY N/A 05/23/2020   Procedure: LEFT HEART CATH AND CORS/GRAFTS ANGIOGRAPHY;  Surgeon: Mady Bruckner, MD;  [facility]: [facility];  Service: Cardiovascular;  Laterality: N/A;   LEFT HEART CATH AND CORS/GRAFTS ANGIOGRAPHY N/A 12/19/2020   Procedure: LEFT HEART CATH AND CORS/GRAFTS ANGIOGRAPHY;  Surgeon: Verlin Bruckner BIRCH, MD;  [facility]: [facility];  Service: Cardiovascular;  Laterality: N/A;   LEFT HEART CATH AND CORS/GRAFTS ANGIOGRAPHY N/A 07/21/2021   Procedure: LEFT HEART CATH AND CORS/GRAFTS ANGIOGRAPHY;  Surgeon: Mady Bruckner, MD;  [facility];  Service: Cardiovascular;  Laterality: N/A;   METATARSAL HEAD EXCISION Right 05/02/2020   Procedure: FIRST METATARSAL HEAD RESECTION; RIGHT FOOT WOUND CLOSURE;  Surgeon: Burt Fus, DPM;  Location: [facility];  Service: Podiatry;  Laterality: Right;  MAC W/LOCAL   PERIPHERAL VASCULAR BALLOON ANGIOPLASTY Left 10/11/2021   Procedure: PERIPHERAL VASCULAR BALLOON ANGIOPLASTY;  Surgeon: Gretta Bruckner PARAS, MD;  [facility];  Service: Cardiovascular;  Laterality: Left;  Anterior Tibial Artery   TEE WITHOUT CARDIOVERSION N/A 11/27/2021   Procedure: TRANSESOPHAGEAL ECHOCARDIOGRAM (TEE);  Surgeon: Okey Vina GAILS, MD;  Location: [facility] ENDOSCOPY;  Service: Cardiovascular;  Laterality: N/A;   TIBIALIS TENDON TRANSFER / REPAIR Left 12/06/2023   Procedure: TIBIALIS TENDON TRANSFER;  Surgeon: Janit Thresa HERO, DPM;  Location: [facility] ORS;  Service: Orthopedics/Podiatry;  Laterality: Left;   TRANSMETATARSAL AMPUTATION Left 11/23/2021   Procedure: TRANSMETATARSAL AMPUTATION;  Surgeon: Joya Stabs, MD;  [facility] OR;  Service: Podiatry;  Laterality: Left;   TUBAL LIGATION      Family History  Problem Relation Age of Onset   Hypertension Mother    Hyperlipidemia Mother    Heart attack Father    Heart disease Father    Hypertension Father     Alzheimer's disease Father    Hypertension Brother    Hyperlipidemia Brother     Social History   Socioeconomic History   Marital status: Married    Spouse name: Not on file   Number of children: Not on file   Years of education: Not on file   Highest education level: Not on file  Occupational History   Not on file  Tobacco Use   Smoking status: Former    Current packs/day: 0.00    Types: Cigarettes    Quit date: 05/2016    Years since quitting: 8.4   Smokeless tobacco: Never  Vaping Use   Vaping status: Never Used  Substance and Sexual Activity   Alcohol use: Yes    Comment: rare   Drug use: No   Sexual activity: Yes    Birth control/protection: Surgical    Comment: Hysterectomy  Other Topics Concern   Not on file  Social History Narrative   Not on file   Social Drivers of Health   Financial Resource Strain: Low Risk  (  10/16/2023)   Overall Financial Resource Strain (CARDIA)    Difficulty of Paying Living Expenses: Not hard at all  Food Insecurity: No Food Insecurity (06/18/2024)   Hunger Vital Sign    Worried About Running Out of Food in the Last Year: Never true    Ran Out of Food in the Last Year: Never true  Transportation Needs: No Transportation Needs (06/18/2024)   PRAPARE - Administrator, Civil Service (Medical): No    Lack of Transportation (Non-Medical): No  Physical Activity: Inactive (10/16/2023)   Exercise Vital Sign    Days of Exercise per Week: 0 days    Minutes of Exercise per Session: 0 min  Stress: No Stress Concern Present (10/16/2023)   [employer] of Occupational Health - Occupational Stress Questionnaire    Feeling of Stress : Not at all  Social Connections: Socially Integrated (10/16/2023)   Social Connection and Isolation Panel    Frequency of Communication with Friends and Family: More than three times a week    Frequency of Social Gatherings with Friends and Family: More than three times a week    Attends Religious  Services: More than 4 times per year    Active Member of [employer] or Organizations: Yes    Attends Engineer, Structural: More than 4 times per year    Marital Status: Married  Catering Manager Violence: Not At Risk (06/18/2024)   Humiliation, Afraid, Rape, and Kick questionnaire    Fear of Current or Ex-Partner: No    Emotionally Abused: No    Physically Abused: No    Sexually Abused: No    Outpatient Medications Prior to Visit  Medication Sig Dispense Refill   acetaminophen  (TYLENOL ) 325 MG tablet Take 2 tablets (650 mg total) by mouth every 4 (four) hours as needed for headache or mild pain.     amiodarone  (PACERONE ) 200 MG tablet Take 1 tablet (200 mg total) by mouth daily. Must have appt for refills. 30 tablet 0   aspirin  81 MG chewable tablet Chew 81 mg by mouth daily.     clopidogrel  (PLAVIX ) 75 MG tablet Take 1 tablet (75 mg total) by mouth daily. 90 tablet 3   Continuous Glucose Sensor (DEXCOM G7 SENSOR) MISC Change sensor every 10 days as directed 3 each 12   Evolocumab  (REPATHA  SURECLICK) 140 MG/ML SOAJ Inject 140 mg into the skin every 14 (fourteen) days. 2 mL 1   FARXIGA  10 MG TABS tablet Take 1 tablet (10 mg total) by mouth daily. 90 tablet 0   furosemide  (LASIX ) 20 MG tablet Take 1 tablet (20 mg total) by mouth daily. 90 tablet 3   HUMALOG  KWIKPEN 100 UNIT/ML KwikPen Inject 1-20 Units into the skin at bedtime. CBG 70 - 120: 0 units CBG 121 - 150: 3 units CBG 151 - 200: 4 units CBG 201 - 250: 7 units CBG 251 - 300: 11 units CBG 301 - 350: 15 units CBG 351 - 400: 20 units CBG > 400: call MD and obtain STAT lab verification 30 mL 2   insulin  glargine, 1 Unit Dial , (TOUJEO  SOLOSTAR) 300 UNIT/ML Solostar Pen Inject 63 Units into the skin daily. 18 mL 1   Insulin  Pen Needle (INSUPEN PEN NEEDLES) 32G X 4 MM MISC Use as directed once daily with insulin . 100 each 1   ketoconazole  (NIZORAL ) 2 % cream Apply 1 Application topically daily. 30 g 1   metFORMIN  (GLUCOPHAGE ) 500 MG tablet  Take 1 tablet (500 mg total)  by mouth 2 (two) times daily with a meal. 180 tablet 0   metoprolol  succinate (TOPROL -XL) 25 MG 24 hr tablet TAKE 1 TABLET BY MOUTH IN THE MORNING, AT NOON AND AT BEDTIME 270 tablet 0   nitroGLYCERIN  (NITROSTAT ) 0.4 MG SL tablet Place 1 tablet (0.4 mg total) under the tongue every 5 (five) minutes as needed for chest pain. 25 tablet 6   nystatin  (MYCOSTATIN /NYSTOP ) powder Apply 1 Application topically daily as needed (rash/yeast). 15 g 2   rosuvastatin  (CRESTOR ) 20 MG tablet Take 1 tablet (20 mg total) by mouth daily. 90 tablet 2   SANTYL 250 UNIT/GM ointment Apply 1 Application topically daily.     valsartan  (DIOVAN ) 80 MG tablet Take 1 tablet (80 mg total) by mouth 2 (two) times daily. 180 tablet 2   Vitamin D , Ergocalciferol , (DRISDOL ) 1.25 MG (50000 UNIT) CAPS capsule TAKE 1 CAPSULE BY MOUTH EVERY SATURDAY 12 capsule 0   No facility-administered medications prior to visit.    Allergies  Allergen Reactions   Vancomycin  Anaphylaxis   Chlorhexidine  Gluconate Itching    Received CHG bath, began itching, required benadryl    Ativan  [Lorazepam ] Other (See Comments)    hallucinations   Cat Dander Other (See Comments)    Sneezing, watery eyes.   Other Itching    Dial  Soap   Tramadol  Other (See Comments)    Hallucinations   Codeine Rash    Review of Systems  Constitutional: Negative.   HENT: Negative.    Eyes: Negative.   Respiratory: Negative.    Cardiovascular: Negative.   Gastrointestinal: Negative.   Genitourinary:  Positive for dysuria.  Musculoskeletal: Negative.   Neurological: Negative.   Psychiatric/Behavioral: Negative.         Objective:        10/28/2024   10:08 AM 10/05/2024    4:01 PM 09/15/2024    4:34 PM  Vitals with BMI  Height 5' 7 5' 7   Weight 219 lbs 214 lbs   BMI 34.29 33.51   Systolic 122  124  Diastolic 68  78  Pulse 73      No data found.   Physical Exam Vitals and nursing note reviewed.  HENT:     Head:  Normocephalic and atraumatic.  Cardiovascular:     Rate and Rhythm: Normal rate and regular rhythm.  Pulmonary:     Effort: Pulmonary effort is normal.     Breath sounds: Normal breath sounds.  Abdominal:     Tenderness: There is no right CVA tenderness or left CVA tenderness.  Skin:    General: Skin is warm.  Neurological:     Mental Status: She is oriented to person, place, and time.  Psychiatric:        Mood and Affect: Mood normal.     Health Maintenance Due  Topic Date Due   Medicare Annual Wellness (AWV)  Never done   Zoster Vaccines- Shingrix (2 of 2) 09/08/2023   Mammogram  09/08/2024    There are no preventive care reminders to display for this patient.   Lab Results  Component Value Date   TSH 3.630 03/13/2024   Lab Results  Component Value Date   WBC 17.3 (H) 06/17/2024   HGB 10.6 (L) 06/17/2024   HCT 33.4 (L) 06/17/2024   MCV 90.0 06/17/2024   PLT 496 (H) 06/17/2024   Lab Results  Component Value Date   NA 138 06/17/2024   K 3.9 06/17/2024   CO2 24 06/17/2024   GLUCOSE  134 (H) 06/17/2024   BUN 15 06/17/2024   CREATININE 1.15 (H) 06/17/2024   BILITOT 0.4 06/17/2024   ALKPHOS 54 06/17/2024   AST 19 06/17/2024   ALT 18 06/17/2024   PROT 7.8 06/17/2024   ALBUMIN 2.4 (L) 06/17/2024   CALCIUM  9.0 06/17/2024   ANIONGAP 12 06/17/2024   EGFR 66 03/13/2024   Lab Results  Component Value Date   CHOL 242 (H) 03/13/2024   Lab Results  Component Value Date   HDL 59 03/13/2024   Lab Results  Component Value Date   LDLCALC 138 (H) 03/13/2024   Lab Results  Component Value Date   TRIG 251 (H) 03/13/2024   Lab Results  Component Value Date   CHOLHDL 4.1 03/13/2024   Lab Results  Component Value Date   HGBA1C 8.9 (H) 06/11/2024        Results for orders placed or performed in visit on 10/28/24  POCT URINALYSIS DIP (CLINITEK)   Collection Time: 10/28/24 10:46 AM  Result Value Ref Range   Color, UA yellow yellow   Clarity, UA cloudy (A)  clear   Glucose, UA >=1,000 (A) negative mg/dL   Bilirubin, UA negative negative   Ketones, POC UA negative negative mg/dL   Spec Grav, UA 8.984 8.989 - 1.025   Blood, UA trace-intact (A) negative   pH, UA 6.0 5.0 - 8.0   POC PROTEIN,UA =100 (A) negative, trace   Urobilinogen, UA 0.2 0.2 or 1.0 E.U./dL   Nitrite, UA Positive (A) Negative   Leukocytes, UA Small (1+) (A) Negative     Assessment & Plan:   Assessment & Plan Acute cystitis with hematuria    Urinary tract infection with dysuria, pending culture results Symptoms include back pain, dysuria, and increased frequency of urination. Urine sample shows signs of infection, but culture results are pending to determine the causative organism. Previous urine culture showed yeast CANDIDA TROPICALIS infection IN URINE CULTURE, complicating treatment due to potential interactions with amiodarone . Decision to wait for culture results before initiating treatment to avoid unnecessary antibiotics and potential interactions. - Sent urine sample for culture to identify causative organism. - Advised to report high fevers or severe symptoms immediately. - Will await culture results to guide treatment.  Recurrent urinary yeast infections, suspected Recurrent yeast infections possibly exacerbated by diabetes and Farxiga  use. Previous yeast infection was not treated due to non-compliance with fluconazole . Potential interaction between yeast infection treatment and amiodarone  necessitates EKG monitoring. Consideration of discontinuing Farxiga  if yeast infection recurs, as it may contribute to recurrent infections. - Will discuss with cardiologist regarding management of yeast infection and potential interactions with amiodarone . - Will consider discontinuing Farxiga  if yeast infection recurs. - Advised to take probiotic pills to help clear infection.     Orders:   POCT URINALYSIS DIP (CLINITEK)   Urine Culture  Dysuria As above. Decision to wait  for cultures before empirically treating.     Uncontrolled type 2 diabetes mellitus with hyperglycemia (HCC) Type 2 diabetes mellitus uncontrolled with hyperglycemia with current long term use of insulin .,  A1c was 8.9 on June 26th, indicating suboptimal control. Current medications include Toujeo , metformin , and Farxiga . Farxiga  may contribute to recurrent yeast infections due to its mechanism of action. Need to reassess diabetes management if Farxiga  is discontinued. - Ordered A1c test during next week's lab visit. - Will consider alternative diabetes medications if Farxiga  is discontinued.           Body mass index is 34.3 kg/m.SABRA  No orders of the defined types were placed in this encounter.   Orders Placed This Encounter  Procedures   Urine Culture   POCT URINALYSIS DIP (CLINITEK)     Follow-up: No follow-ups on file.  An After Visit Summary was printed and given to the patient.  Kerriann Kamphuis, MD [facility] [phone]

## 2024-10-28 NOTE — Assessment & Plan Note (Signed)
  Urinary tract infection with dysuria, pending culture results Symptoms include back pain, dysuria, and increased frequency of urination. Urine sample shows signs of infection, but culture results are pending to determine the causative organism. Previous urine culture showed yeast CANDIDA TROPICALIS infection IN URINE CULTURE, complicating treatment due to potential interactions with amiodarone . Decision to wait for culture results before initiating treatment to avoid unnecessary antibiotics and potential interactions. - Sent urine sample for culture to identify causative organism. - Advised to report high fevers or severe symptoms immediately. - Will await culture results to guide treatment.  Recurrent urinary yeast infections, suspected Recurrent yeast infections possibly exacerbated by diabetes and Farxiga  use. Previous yeast infection was not treated due to non-compliance with fluconazole . Potential interaction between yeast infection treatment and amiodarone  necessitates EKG monitoring. Consideration of discontinuing Farxiga  if yeast infection recurs, as it may contribute to recurrent infections. - Will discuss with cardiologist regarding management of yeast infection and potential interactions with amiodarone . - Will consider discontinuing Farxiga  if yeast infection recurs. - Advised to take probiotic pills to help clear infection.     Orders:   POCT URINALYSIS DIP (CLINITEK)   Urine Culture

## 2024-10-29 DIAGNOSIS — L97413 Non-pressure chronic ulcer of right heel and midfoot with necrosis of muscle: Secondary | ICD-10-CM | POA: Diagnosis not present

## 2024-10-29 DIAGNOSIS — L89610 Pressure ulcer of right heel, unstageable: Secondary | ICD-10-CM | POA: Diagnosis not present

## 2024-10-30 ENCOUNTER — Other Ambulatory Visit: Payer: Self-pay | Admitting: Cardiology

## 2024-10-30 ENCOUNTER — Other Ambulatory Visit (HOSPITAL_BASED_OUTPATIENT_CLINIC_OR_DEPARTMENT_OTHER): Payer: Self-pay

## 2024-10-30 ENCOUNTER — Ambulatory Visit: Payer: Self-pay

## 2024-10-30 ENCOUNTER — Other Ambulatory Visit: Payer: Self-pay | Admitting: Physician Assistant

## 2024-10-30 LAB — URINE CULTURE

## 2024-11-02 ENCOUNTER — Other Ambulatory Visit (HOSPITAL_BASED_OUTPATIENT_CLINIC_OR_DEPARTMENT_OTHER): Payer: Self-pay

## 2024-11-02 DIAGNOSIS — E1169 Type 2 diabetes mellitus with other specified complication: Secondary | ICD-10-CM | POA: Diagnosis not present

## 2024-11-02 DIAGNOSIS — E1151 Type 2 diabetes mellitus with diabetic peripheral angiopathy without gangrene: Secondary | ICD-10-CM | POA: Diagnosis not present

## 2024-11-02 DIAGNOSIS — F419 Anxiety disorder, unspecified: Secondary | ICD-10-CM | POA: Diagnosis not present

## 2024-11-02 DIAGNOSIS — M86171 Other acute osteomyelitis, right ankle and foot: Secondary | ICD-10-CM | POA: Diagnosis not present

## 2024-11-02 DIAGNOSIS — M21172 Varus deformity, not elsewhere classified, left ankle: Secondary | ICD-10-CM | POA: Diagnosis not present

## 2024-11-02 DIAGNOSIS — Z7984 Long term (current) use of oral hypoglycemic drugs: Secondary | ICD-10-CM | POA: Diagnosis not present

## 2024-11-02 DIAGNOSIS — I5032 Chronic diastolic (congestive) heart failure: Secondary | ICD-10-CM | POA: Diagnosis not present

## 2024-11-02 DIAGNOSIS — N390 Urinary tract infection, site not specified: Secondary | ICD-10-CM | POA: Diagnosis not present

## 2024-11-02 DIAGNOSIS — E1140 Type 2 diabetes mellitus with diabetic neuropathy, unspecified: Secondary | ICD-10-CM | POA: Diagnosis not present

## 2024-11-02 DIAGNOSIS — M71071 Abscess of bursa, right ankle and foot: Secondary | ICD-10-CM | POA: Diagnosis not present

## 2024-11-02 DIAGNOSIS — J439 Emphysema, unspecified: Secondary | ICD-10-CM | POA: Diagnosis not present

## 2024-11-02 DIAGNOSIS — I472 Ventricular tachycardia, unspecified: Secondary | ICD-10-CM | POA: Diagnosis not present

## 2024-11-02 DIAGNOSIS — S91341D Puncture wound with foreign body, right foot, subsequent encounter: Secondary | ICD-10-CM | POA: Diagnosis not present

## 2024-11-02 DIAGNOSIS — I252 Old myocardial infarction: Secondary | ICD-10-CM | POA: Diagnosis not present

## 2024-11-02 DIAGNOSIS — B951 Streptococcus, group B, as the cause of diseases classified elsewhere: Secondary | ICD-10-CM | POA: Diagnosis not present

## 2024-11-02 DIAGNOSIS — E559 Vitamin D deficiency, unspecified: Secondary | ICD-10-CM | POA: Diagnosis not present

## 2024-11-02 DIAGNOSIS — D649 Anemia, unspecified: Secondary | ICD-10-CM | POA: Diagnosis not present

## 2024-11-02 DIAGNOSIS — E538 Deficiency of other specified B group vitamins: Secondary | ICD-10-CM | POA: Diagnosis not present

## 2024-11-02 DIAGNOSIS — I2510 Atherosclerotic heart disease of native coronary artery without angina pectoris: Secondary | ICD-10-CM | POA: Diagnosis not present

## 2024-11-02 DIAGNOSIS — L02611 Cutaneous abscess of right foot: Secondary | ICD-10-CM | POA: Diagnosis not present

## 2024-11-02 DIAGNOSIS — E1165 Type 2 diabetes mellitus with hyperglycemia: Secondary | ICD-10-CM | POA: Diagnosis not present

## 2024-11-02 DIAGNOSIS — L03115 Cellulitis of right lower limb: Secondary | ICD-10-CM | POA: Diagnosis not present

## 2024-11-02 DIAGNOSIS — E785 Hyperlipidemia, unspecified: Secondary | ICD-10-CM | POA: Diagnosis not present

## 2024-11-02 DIAGNOSIS — I110 Hypertensive heart disease with heart failure: Secondary | ICD-10-CM | POA: Diagnosis not present

## 2024-11-02 MED ORDER — AMIODARONE HCL 200 MG PO TABS
200.0000 mg | ORAL_TABLET | Freq: Every day | ORAL | 0 refills | Status: DC
  Filled 2024-11-02: qty 30, 30d supply, fill #0

## 2024-11-03 NOTE — Telephone Encounter (Signed)
 Copied from CRM [id number]. Topic: Clinical - Lab/Test Results >> Nov 03, 2024 12:01 PM Dedra B wrote: Reason for CRM: Pt calling regarding urine test results. Relayed message verbatim. Pt verbalized understanding and had no further questions.

## 2024-11-05 DIAGNOSIS — L97413 Non-pressure chronic ulcer of right heel and midfoot with necrosis of muscle: Secondary | ICD-10-CM | POA: Diagnosis not present

## 2024-11-05 DIAGNOSIS — L89610 Pressure ulcer of right heel, unstageable: Secondary | ICD-10-CM | POA: Diagnosis not present

## 2024-11-09 DIAGNOSIS — I472 Ventricular tachycardia, unspecified: Secondary | ICD-10-CM | POA: Diagnosis not present

## 2024-11-09 DIAGNOSIS — E559 Vitamin D deficiency, unspecified: Secondary | ICD-10-CM | POA: Diagnosis not present

## 2024-11-09 DIAGNOSIS — M71071 Abscess of bursa, right ankle and foot: Secondary | ICD-10-CM | POA: Diagnosis not present

## 2024-11-09 DIAGNOSIS — L03115 Cellulitis of right lower limb: Secondary | ICD-10-CM | POA: Diagnosis not present

## 2024-11-09 DIAGNOSIS — I252 Old myocardial infarction: Secondary | ICD-10-CM | POA: Diagnosis not present

## 2024-11-09 DIAGNOSIS — M21172 Varus deformity, not elsewhere classified, left ankle: Secondary | ICD-10-CM | POA: Diagnosis not present

## 2024-11-09 DIAGNOSIS — J439 Emphysema, unspecified: Secondary | ICD-10-CM | POA: Diagnosis not present

## 2024-11-09 DIAGNOSIS — N390 Urinary tract infection, site not specified: Secondary | ICD-10-CM | POA: Diagnosis not present

## 2024-11-09 DIAGNOSIS — I5032 Chronic diastolic (congestive) heart failure: Secondary | ICD-10-CM | POA: Diagnosis not present

## 2024-11-09 DIAGNOSIS — L02611 Cutaneous abscess of right foot: Secondary | ICD-10-CM | POA: Diagnosis not present

## 2024-11-09 DIAGNOSIS — E538 Deficiency of other specified B group vitamins: Secondary | ICD-10-CM | POA: Diagnosis not present

## 2024-11-09 DIAGNOSIS — Z7984 Long term (current) use of oral hypoglycemic drugs: Secondary | ICD-10-CM | POA: Diagnosis not present

## 2024-11-09 DIAGNOSIS — E1140 Type 2 diabetes mellitus with diabetic neuropathy, unspecified: Secondary | ICD-10-CM | POA: Diagnosis not present

## 2024-11-09 DIAGNOSIS — I2510 Atherosclerotic heart disease of native coronary artery without angina pectoris: Secondary | ICD-10-CM | POA: Diagnosis not present

## 2024-11-09 DIAGNOSIS — I110 Hypertensive heart disease with heart failure: Secondary | ICD-10-CM | POA: Diagnosis not present

## 2024-11-09 DIAGNOSIS — E1151 Type 2 diabetes mellitus with diabetic peripheral angiopathy without gangrene: Secondary | ICD-10-CM | POA: Diagnosis not present

## 2024-11-09 DIAGNOSIS — B951 Streptococcus, group B, as the cause of diseases classified elsewhere: Secondary | ICD-10-CM | POA: Diagnosis not present

## 2024-11-09 DIAGNOSIS — S91341D Puncture wound with foreign body, right foot, subsequent encounter: Secondary | ICD-10-CM | POA: Diagnosis not present

## 2024-11-09 DIAGNOSIS — M86171 Other acute osteomyelitis, right ankle and foot: Secondary | ICD-10-CM | POA: Diagnosis not present

## 2024-11-09 DIAGNOSIS — F419 Anxiety disorder, unspecified: Secondary | ICD-10-CM | POA: Diagnosis not present

## 2024-11-09 DIAGNOSIS — E1169 Type 2 diabetes mellitus with other specified complication: Secondary | ICD-10-CM | POA: Diagnosis not present

## 2024-11-09 DIAGNOSIS — E785 Hyperlipidemia, unspecified: Secondary | ICD-10-CM | POA: Diagnosis not present

## 2024-11-09 DIAGNOSIS — D649 Anemia, unspecified: Secondary | ICD-10-CM | POA: Diagnosis not present

## 2024-11-09 DIAGNOSIS — E1165 Type 2 diabetes mellitus with hyperglycemia: Secondary | ICD-10-CM | POA: Diagnosis not present

## 2024-11-10 ENCOUNTER — Encounter: Payer: Self-pay | Admitting: Physician Assistant

## 2024-11-10 ENCOUNTER — Other Ambulatory Visit (HOSPITAL_BASED_OUTPATIENT_CLINIC_OR_DEPARTMENT_OTHER): Payer: Self-pay

## 2024-11-10 ENCOUNTER — Ambulatory Visit (INDEPENDENT_AMBULATORY_CARE_PROVIDER_SITE_OTHER): Admitting: Physician Assistant

## 2024-11-10 VITALS — BP 120/70 | HR 62 | Temp 97.8°F | Resp 18 | Ht 67.0 in | Wt 217.4 lb

## 2024-11-10 DIAGNOSIS — Z95810 Presence of automatic (implantable) cardiac defibrillator: Secondary | ICD-10-CM

## 2024-11-10 DIAGNOSIS — I25119 Atherosclerotic heart disease of native coronary artery with unspecified angina pectoris: Secondary | ICD-10-CM

## 2024-11-10 DIAGNOSIS — E1165 Type 2 diabetes mellitus with hyperglycemia: Secondary | ICD-10-CM

## 2024-11-10 DIAGNOSIS — E661 Drug-induced obesity: Secondary | ICD-10-CM

## 2024-11-10 DIAGNOSIS — E559 Vitamin D deficiency, unspecified: Secondary | ICD-10-CM | POA: Diagnosis not present

## 2024-11-10 DIAGNOSIS — E782 Mixed hyperlipidemia: Secondary | ICD-10-CM | POA: Diagnosis not present

## 2024-11-10 DIAGNOSIS — I10 Essential (primary) hypertension: Secondary | ICD-10-CM | POA: Diagnosis not present

## 2024-11-10 DIAGNOSIS — Z6834 Body mass index (BMI) 34.0-34.9, adult: Secondary | ICD-10-CM

## 2024-11-10 DIAGNOSIS — M545 Low back pain, unspecified: Secondary | ICD-10-CM

## 2024-11-10 DIAGNOSIS — E66811 Obesity, class 1: Secondary | ICD-10-CM

## 2024-11-10 DIAGNOSIS — I5032 Chronic diastolic (congestive) heart failure: Secondary | ICD-10-CM

## 2024-11-10 DIAGNOSIS — E11621 Type 2 diabetes mellitus with foot ulcer: Secondary | ICD-10-CM

## 2024-11-10 MED ORDER — CYCLOBENZAPRINE HCL 5 MG PO TABS
5.0000 mg | ORAL_TABLET | Freq: Every day | ORAL | 1 refills | Status: AC
  Filled 2024-11-10: qty 30, 30d supply, fill #0

## 2024-11-10 NOTE — Progress Notes (Signed)
 Subjective:  [name], female    DOB: 1961-03-23  Age: 63 y.o. MRN: [id number]  Chief Complaint  Patient presents with   Medical Management of Chronic Issues    Diabetes  Hypertension    Alayziah Tangeman has a history of type 2 Diabetes Mellitus for more than 20 years. Current treatment includes toujeo  63 units qd.  humalog  sliding scale, farxiga  and glucophage    She denies hypoglycemic episodes. She states blood glucose levels at home fluctuate  Her CBG average today shows A1c for last 30 days at 8.1 -   . She is following with [facility] every month, wound care at hospital weekly and has home health nurse coming out weekly --- this is for her having chronic osteomyelitis  Pt with history of hyperlipidemia and CAD- currently also follows with cardiology.  Treatment includes , tricor , fish oil and crestor  20mg  qd- She has been advised to take repatha  but is not taking  Pt with history of vit D disorder - taking weekly supplement and due for labwork  Pt with history of GERD - stable on omeprazole  20mg  qd -  Pt follows with cardiology on a regular basis.  She sees Dr Monetta for history of CAD, chronic diastolic heart failure,and history of vtach.  She is currently on amiodarone  therapy and has implantable defibrillator (which is monitored regularly).  She is also on  metoprolol , lasix , plavix ,  Pt with history of hypertension as well and also taking diovan  80mg  qd  Pt complains of low back pain - she states she has soreness with certain movements and worse in mornings when she first gets up - says she cannot recall any specific injury or trauma     Lab Results  Component Value Date   HGBA1C 8.9 (H) 06/11/2024   HGBA1C 9.4 (H) 02/18/2024   HGBA1C 10.1 (H) 12/05/2023    Current Outpatient Medications on File Prior to Visit  Medication Sig Dispense Refill   acetaminophen  (TYLENOL ) 325 MG tablet Take 2 tablets (650 mg total) by mouth every 4 (four) hours as needed  for headache or mild pain.     amiodarone  (PACERONE ) 200 MG tablet Take 1 tablet (200 mg total) by mouth daily. Must have appt for refills. 30 tablet 0   aspirin  81 MG chewable tablet Chew 81 mg by mouth daily.     clopidogrel  (PLAVIX ) 75 MG tablet Take 1 tablet (75 mg total) by mouth daily. 90 tablet 3   Continuous Glucose Sensor (DEXCOM G7 SENSOR) MISC Change sensor every 10 days as directed 3 each 12   Evolocumab  (REPATHA  SURECLICK) 140 MG/ML SOAJ Inject 140 mg into the skin every 14 (fourteen) days. 2 mL 1   FARXIGA  10 MG TABS tablet Take 1 tablet (10 mg total) by mouth daily. 90 tablet 0   furosemide  (LASIX ) 20 MG tablet Take 1 tablet (20 mg total) by mouth daily. 90 tablet 3   HUMALOG  KWIKPEN 100 UNIT/ML KwikPen Inject 1-20 Units into the skin at bedtime. CBG 70 - 120: 0 units CBG 121 - 150: 3 units CBG 151 - 200: 4 units CBG 201 - 250: 7 units CBG 251 - 300: 11 units CBG 301 - 350: 15 units CBG 351 - 400: 20 units CBG > 400: call MD and obtain STAT lab verification 30 mL 2   insulin  glargine, 1 Unit Dial , (TOUJEO  SOLOSTAR) 300 UNIT/ML Solostar Pen Inject 63 Units into the skin daily. 18 mL 1   Insulin  Pen Needle (  INSUPEN PEN NEEDLES) 32G X 4 MM MISC Use as directed once daily with insulin . 100 each 1   ketoconazole  (NIZORAL ) 2 % cream Apply 1 Application topically daily. 30 g 1   metFORMIN  (GLUCOPHAGE ) 500 MG tablet Take 1 tablet (500 mg total) by mouth 2 (two) times daily with a meal. 180 tablet 0   metoprolol  succinate (TOPROL -XL) 25 MG 24 hr tablet TAKE 1 TABLET BY MOUTH IN THE MORNING, AT NOON AND AT BEDTIME 270 tablet 0   nitroGLYCERIN  (NITROSTAT ) 0.4 MG SL tablet Place 1 tablet (0.4 mg total) under the tongue every 5 (five) minutes as needed for chest pain. 25 tablet 6   nystatin  (MYCOSTATIN /NYSTOP ) powder Apply 1 Application topically daily as needed (rash/yeast). 15 g 2   rosuvastatin  (CRESTOR ) 20 MG tablet Take 1 tablet (20 mg total) by mouth daily. 90 tablet 2   SANTYL 250 UNIT/GM  ointment Apply 1 Application topically daily.     valsartan  (DIOVAN ) 80 MG tablet Take 1 tablet (80 mg total) by mouth 2 (two) times daily. 180 tablet 2   Vitamin D , Ergocalciferol , (DRISDOL ) 1.25 MG (50000 UNIT) CAPS capsule TAKE 1 CAPSULE BY MOUTH EVERY SATURDAY 12 capsule 0   No current facility-administered medications on file prior to visit.   Past Medical History:  Diagnosis Date   Abnormal EKG 10/23/2021   Abnormal mammogram 08/09/2023   Abnormal mammogram of left breast 09/04/2021   Abnormal stress test 10/23/2021   Abscess of bursa of right foot 06/12/2024   Abscess of left elbow 02/18/2024   Abscess of right foot 06/10/2024   Absolute anemia 09/20/2022   Acquired varus deformity of foot, left    Acute chest pain 05/08/2016   Acute hemorrhagic cystitis 08/09/2023   Acute osteomyelitis of right calcaneus (HCC) 06/15/2024   AICD (automatic cardioverter/defibrillator) present    St Jude/Abbott device   AKI (acute kidney injury) 10/23/2021   Amputation of right great toe 09/20/2022   Angina pectoris 09/11/2017   Anxiety 10/23/2021   B12 deficiency 09/20/2022   Bacteremia 11/22/2021   Breast wound, right, subsequent encounter 09/04/2021   Burping 05/08/2016   Cancer (HCC)    cervical - hysterectomy   Cellulitis of right breast 10/23/2021   Cellulitis of right foot 06/15/2024   Cellulitis, wound, post-operative 10/09/2021   Chronic diastolic heart failure (HCC) 11/06/2016   Chronic osteomyelitis of left foot (HCC) 10/16/2023   Colon cancer screening 09/20/2022   Coronary artery disease involving native coronary artery of native heart with angina pectoris 09/12/2015   Overview:  PCI and stent of RCA 2009, last cath 2012 with medical therapy  CABG May 2017   Demand ischemia [facility])    Diabetes mellitus due to underlying condition with hyperglycemia, with long-term current use of insulin  (HCC) 09/20/2022   Diabetic foot infection (HCC) 11/21/2021   Diabetic foot ulcer (HCC)  02/16/2020   Emphysema lung (HCC) 09/11/2017   patient not aware of this dx   Encounter for preoperative assessment 10/16/2023   Essential hypertension 09/12/2015   Gangrene (HCC) 10/23/2021   GERD (gastroesophageal reflux disease) 05/08/2016   Hematoma 10/23/2021   Hiatal hernia 10/23/2021   History of methicillin resistant staphylococcus aureus (MRSA)    Hyperglycemia 10/23/2021   Hyperglycemia due to type 2 diabetes mellitus (HCC) 10/23/2021   Hyperkalemia    Hyperlipidemia 09/12/2015   Hyponatremia 10/23/2021   ICD (implantable cardioverter-defibrillator) in place 05/16/2021   Infection of great toe 10/23/2021   Intertrigo 09/20/2022   Lactic acidosis 10/23/2021  Leukocytosis 10/23/2021   Mediastinitis 06/28/2016   Morbid obesity (HCC) 05/08/2016   Myocardial infarction [facility])    Needs flu shot 09/20/2022   NSTEMI (non-ST elevated myocardial infarction) (HCC) 05/22/2020   Obesity (BMI 30-39.9) 10/23/2021   Partial nontraumatic amputation of left foot (HCC) 09/20/2022   Perineal abscess 10/23/2021   Peripheral vascular disease    Right foot infection 06/10/2024   S/P CABG (coronary artery bypass graft) 06/03/2016   Overview:  The patient underwent sternal reconstruction on 06/21/16 with pec flaps for mediastinitis from a prior CABG in May 2017. On admission, she was critically ill from sepsis and had altered mental status. She was last seen in clinic on 08/09/16 at which time she was doing well.   Severe sepsis (HCC) 06/03/2016   Sinus tachycardia 05/08/2016   Tobacco use disorder 04/20/2016   Overview:  Quit in May 2017.   Type 2 diabetes mellitus with foot ulcer (HCC) 05/08/2016   Uncontrolled type 2 diabetes mellitus with hyperglycemia (HCC) 02/18/2024   Unstable angina (HCC) 05/22/2020   Ventricular tachycardia (HCC) 12/17/2020   Vitamin D  deficiency 09/20/2022   Wound, surgical, infected 06/06/2016   Overview:  sternal   Past Surgical History:  Procedure Laterality  Date   ABDOMINAL AORTOGRAM W/LOWER EXTREMITY N/A 10/11/2021   Procedure: ABDOMINAL AORTOGRAM W/LOWER EXTREMITY;  Surgeon: Gretta Lonni PARAS, MD;  [facility]: [facility];  Service: Cardiovascular;  Laterality: N/A;   AMPUTATION TOE Right 02/20/2020   Procedure: AMPUTATION RIGHT GREAT  TOE;  Surgeon: Tobie Franky SQUIBB, DPM;  Location: [facility] OR;  Service: Podiatry;  Laterality: Right;   BLADDER SURGERY     BONE BIOPSY Left 02/01/2022   Procedure: BONE BIOPSY LEFT FOOT, IRRIGATION AND DEBRIDEMENT;  Surgeon: Burt Fus, DPM;  Location: [facility] OR;  Service: Podiatry;  Laterality: Left;   BONE BIOPSY Left 10/31/2023   Procedure: BONE BIOPSY;  Surgeon: Janit Thresa HERO, DPM;  Location: [facility] ORS;  Service: Orthopedics/Podiatry;  Laterality: Left;   BUBBLE STUDY  11/27/2021   Procedure: BUBBLE STUDY;  Surgeon: Okey Vina GAILS, MD;  Location: [facility] ENDOSCOPY;  Service: Cardiovascular;;   CARDIAC CATHETERIZATION     CORONARY ARTERY BYPASS GRAFT     CORONARY STENT INTERVENTION N/A 05/25/2020   Procedure: CORONARY STENT INTERVENTION;  Surgeon: Darron Deatrice LABOR, MD;  [facility]: [facility];  Service: Cardiovascular;  Laterality: N/A;   CORONARY STENT INTERVENTION N/A 06/22/2020   Procedure: CORONARY STENT INTERVENTION;  Surgeon: Anner Alm ORN, MD;  Location: [facility] INVASIVE CV LAB;  Service: Cardiovascular;  Laterality: N/A;   CORONARY ULTRASOUND/IVUS N/A 06/22/2020   Procedure: Intravascular Ultrasound/IVUS;  Surgeon: Anner Alm ORN, MD;  Location: [facility] INVASIVE CV LAB;  Service: Cardiovascular;  Laterality: N/A;   ICD IMPLANT N/A 12/19/2020   Procedure: ICD IMPLANT;  Surgeon: Waddell Danelle ORN, MD;  Location: [facility] INVASIVE CV LAB;  Service: Cardiovascular;  Laterality: N/A;   INCISION AND DRAINAGE Right 02/19/2020   Procedure: INCISION AND DRAINAGE;  Surgeon: Gershon Donnice SAUNDERS, DPM;  Location: [facility];  Service: Podiatry;  Laterality: Right;  Block done by surgeon   INCISION AND DRAINAGE OF WOUND Left 10/31/2023    Procedure: IRRIGATION AND DEBRIDEMENT WOUND;  Surgeon: Janit Thresa HERO, DPM;  Location: [facility] ORS;  Service: Orthopedics/Podiatry;  Laterality: Left;   IRRIGATION AND DEBRIDEMENT ABSCESS Right 06/12/2024   Procedure: IRRIGATION AND DEBRIDEMENT ABSCESS;  Surgeon: Malvin Marsa FALCON, DPM;  [facility]: [facility];  Service: Orthopedics/Podiatry;  Laterality: Right;  I&D R heel, possible  bone biopsy   IRRIGATION AND DEBRIDEMENT FOOT Left 10/10/2021   Procedure: IRRIGATION AND DEBRIDEMENT FOOT;  Surgeon: Burt Fus, DPM;  [facility]: [facility];  Service: Podiatry;  Laterality: Left;   IRRIGATION AND DEBRIDEMENT FOOT Left 11/23/2021   Procedure: IRRIGATION AND DEBRIDEMENT FOOT;  Surgeon: Joya Stabs, MD;  Location: [facility];  Service: Podiatry;  Laterality: Left;  will do local block   LEFT HEART CATH AND CORS/GRAFTS ANGIOGRAPHY N/A 05/23/2020   Procedure: LEFT HEART CATH AND CORS/GRAFTS ANGIOGRAPHY;  Surgeon: Mady Bruckner, MD;  [facility];  Service: Cardiovascular;  Laterality: N/A;   LEFT HEART CATH AND CORS/GRAFTS ANGIOGRAPHY N/A 12/19/2020   Procedure: LEFT HEART CATH AND CORS/GRAFTS ANGIOGRAPHY;  Surgeon: Verlin Bruckner BIRCH, MD;  [facility];  Service: Cardiovascular;  Laterality: N/A;   LEFT HEART CATH AND CORS/GRAFTS ANGIOGRAPHY N/A 07/21/2021   Procedure: LEFT HEART CATH AND CORS/GRAFTS ANGIOGRAPHY;  Surgeon: Mady Bruckner, MD;  [facility];  Service: Cardiovascular;  Laterality: N/A;   METATARSAL HEAD EXCISION Right 05/02/2020   Procedure: FIRST METATARSAL HEAD RESECTION; RIGHT FOOT WOUND CLOSURE;  Surgeon: Burt Fus, DPM;  Location: [facility];  Service: Podiatry;  Laterality: Right;  MAC W/LOCAL   PERIPHERAL VASCULAR BALLOON ANGIOPLASTY Left 10/11/2021   Procedure: PERIPHERAL VASCULAR BALLOON ANGIOPLASTY;  Surgeon: Gretta Bruckner PARAS, MD;  [facility];  Service: Cardiovascular;  Laterality: Left;  Anterior  Tibial Artery   TEE WITHOUT CARDIOVERSION N/A 11/27/2021   Procedure: TRANSESOPHAGEAL ECHOCARDIOGRAM (TEE);  Surgeon: Okey Vina GAILS, MD;  Location: [facility] ENDOSCOPY;  Service: Cardiovascular;  Laterality: N/A;   TIBIALIS TENDON TRANSFER / REPAIR Left 12/06/2023   Procedure: TIBIALIS TENDON TRANSFER;  Surgeon: Janit Thresa HERO, DPM;  Location: [facility] ORS;  Service: Orthopedics/Podiatry;  Laterality: Left;   TRANSMETATARSAL AMPUTATION Left 11/23/2021   Procedure: TRANSMETATARSAL AMPUTATION;  Surgeon: Joya Stabs, MD;  Location: [facility] OR;  Service: Podiatry;  Laterality: Left;   TUBAL LIGATION      Family History  Problem Relation Age of Onset   Hypertension Mother    Hyperlipidemia Mother    Heart attack Father    Heart disease Father    Hypertension Father    Alzheimer's disease Father    Hypertension Brother    Hyperlipidemia Brother    Social History   Socioeconomic History   Marital status: Married    Spouse name: Not on file   Number of children: Not on file   Years of education: Not on file   Highest education level: Not on file  Occupational History   Not on file  Tobacco Use   Smoking status: Former    Current packs/day: 0.00    Types: Cigarettes    Quit date: 05/2016    Years since quitting: 8.4   Smokeless tobacco: Never  Vaping Use   Vaping status: Never Used  Substance and Sexual Activity   Alcohol use: Yes    Comment: rare   Drug use: No   Sexual activity: Yes    Birth control/protection: Surgical    Comment: Hysterectomy  Other Topics Concern   Not on file  Social History Narrative   Not on file   Social Drivers of Health   Financial Resource Strain: Low Risk  (10/16/2023)   Overall Financial Resource Strain (CARDIA)    Difficulty of Paying Living Expenses: Not hard at all  Food Insecurity: No Food Insecurity (06/18/2024)   Hunger Vital Sign  Worried About Programme Researcher, Broadcasting/film/video in the Last Year: Never true    Ran Out of Food in the Last Year: Never true   Transportation Needs: No Transportation Needs (06/18/2024)   PRAPARE - Administrator, Civil Service (Medical): No    Lack of Transportation (Non-Medical): No  Physical Activity: Inactive (10/16/2023)   Exercise Vital Sign    Days of Exercise per Week: 0 days    Minutes of Exercise per Session: 0 min  Stress: No Stress Concern Present (10/16/2023)   [employer] of Occupational Health - Occupational Stress Questionnaire    Feeling of Stress : Not at all  Social Connections: Socially Integrated (10/16/2023)   Social Connection and Isolation Panel    Frequency of Communication with Friends and Family: More than three times a week    Frequency of Social Gatherings with Friends and Family: More than three times a week    Attends Religious Services: More than 4 times per year    Active Member of [employer] or Organizations: Yes    Attends Engineer, Structural: More than 4 times per year    Marital Status: Married   CONSTITUTIONAL: Negative for chills, fatigue, fever, unintentional weight gain and unintentional weight loss.  E/N/T: Negative for ear pain, nasal congestion and sore throat.  CARDIOVASCULAR: Negative for chest pain, dizziness, palpitations and pedal edema.  RESPIRATORY: Negative for recent cough and dyspnea.  GASTROINTESTINAL: Negative for abdominal pain, acid reflux symptoms, constipation, diarrhea, nausea and vomiting.  MSK: see HPI INTEGUMENTARY: Negative for rash.  NEUROLOGICAL: Negative for dizziness and headaches.  PSYCHIATRIC: Negative for sleep disturbance and to question depression screen.  Negative for depression, negative for anhedonia.        Objective:  PHYSICAL EXAM:   VS: BP 120/70   Pulse 62   Temp 97.8 F (36.6 C) (Temporal)   Resp 18   Ht 5' 7 (1.702 m)   Wt 217 lb 6.4 oz (98.6 kg)   LMP  (LMP Unknown)   SpO2 99%   BMI 34.05 kg/m   GEN: Well nourished, well developed, in no acute distress  Cardiac: RRR; no murmurs, rubs,  or gallops,no edema - Respiratory:  normal respiratory rate and pattern with no distress - normal breath sounds with no rales, rhonchi, wheezes or rubs  FD:mphyu foot in boot Skin: warm and dry, no rash  Neuro:  Alert and Oriented x 3, - CN II-Xii grossly intact Psych: euthymic mood, appropriate affect and demeanor   No visits with results within 1 Day(s) from this visit.  Latest known visit with results is:  Office Visit on 10/28/2024  Component Date Value Ref Range Status   Color, UA 10/28/2024 yellow  yellow Final   Clarity, UA 10/28/2024 cloudy (A)  clear Final   Glucose, UA 10/28/2024 >=1,000 (A)  negative mg/dL Final   Bilirubin, UA 88/87/7974 negative  negative Final   Ketones, POC UA 10/28/2024 negative  negative mg/dL Final   Spec Grav, UA 88/87/7974 1.015  1.010 - 1.025 Final   Blood, UA 10/28/2024 trace-intact (A)  negative Final   pH, UA 10/28/2024 6.0  5.0 - 8.0 Final   POC PROTEIN,UA 10/28/2024 =100 (A)  negative, trace Final   Urobilinogen, UA 10/28/2024 0.2  0.2 or 1.0 E.U./dL Final   Nitrite, UA 88/87/7974 Positive (A)  Negative Final   Leukocytes, UA 10/28/2024 Small (1+) (A)  Negative Final   Urine Culture, Routine 10/28/2024 Final report   Final  Organism ID, Bacteria 10/28/2024 Comment   Final   Comment: Mixed urogenital flora 25,000-50,000 colony forming units per mL         Lab Results  Component Value Date   WBC 17.3 (H) 06/17/2024   HGB 10.6 (L) 06/17/2024   HCT 33.4 (L) 06/17/2024   PLT 496 (H) 06/17/2024   GLUCOSE 134 (H) 06/17/2024   CHOL 242 (H) 03/13/2024   TRIG 251 (H) 03/13/2024   HDL 59 03/13/2024   LDLDIRECT 91.4 05/22/2020   LDLCALC 138 (H) 03/13/2024   ALT 18 06/17/2024   AST 19 06/17/2024   NA 138 06/17/2024   K 3.9 06/17/2024   CL 102 06/17/2024   CREATININE 1.15 (H) 06/17/2024   BUN 15 06/17/2024   CO2 24 06/17/2024   TSH 3.630 03/13/2024   INR 1.1 07/20/2021   HGBA1C 8.9 (H) 06/11/2024   MICROALBUR 293.5 (H)  07/21/2021      Assessment & Plan:   Problem List Items Addressed This Visit       Cardiovascular and Mediastinum   Chronic diastolic heart failure (HCC)   Relevant Medications   nitroGLYCERIN  (NITROSTAT ) 0.4 MG SL tablet Continue meds Follow up with cardiology   Essential hypertension - Primary   Relevant Medications   nitroGLYCERIN  (NITROSTAT ) 0.4 MG SL tablet Continue all meds Follow with cardiology   Other Relevant Orders   CBC with Differential/Platelet   Comprehensive metabolic panel   TSH                            Endocrine   Diabetic foot ulcer (HCC)   Relevant Medications   insulin  degludec (TRESIBA  FLEXTOUCH) 100 UNIT/ML FlexTouch Pen Continue follow up with wound management   Uncontrolled Diabetes mellitus due to underlying condition with hyperglycemia, with long-term current use of insulin  (HCC)   Relevant Medications   insulin  degludec (TRESIBA  FLEXTOUCH) 100 UNIT/ML FlexTouch Pen   Other Relevant Orders   CBC with Differential/Platelet   Comprehensive metabolic panel   TSH   Hemoglobin A1c    CAD involving native coronary artery of native heart with stable angina Continue meds  Follow up with cardiology as directed     Musculoskeletal and Integument   Low back pain Rx flexeril  5mg  qhs           Other   Hyperlipidemia   Relevant Medications   nitroGLYCERIN  (NITROSTAT ) 0.4 MG SL tablet   Other Relevant Orders   Lipid panel Continue meds  Continue statin   ICD (implantable cardioverter-defibrillator) in place   Relevant Medications   nitroGLYCERIN  (NITROSTAT ) 0.4 MG SL tablet Follow with cardiology      Partial nontraumatic amputation of left foot (HCC)   Vitamin D  deficiency   Relevant Orders   VITAMIN D  25 Hydroxy (Vit-D Deficiency, Fractures)   Class 1 obesity with BMI 34 with comorbidity - IDDM, hypertension, hyperlipidemia                                      .  Meds ordered this encounter  Medications    cyclobenzaprine  (FLEXERIL ) 5 MG tablet    Sig: Take 1 tablet (5 mg total) by mouth at bedtime.    Dispense:  30 tablet    Refill:  1    Supervising Provider:   COX, KIRSTEN [phone]    Orders Placed This Encounter  Procedures  CBC with Differential/Platelet   Comprehensive metabolic panel with GFR   TSH   Lipid panel   Hemoglobin A1c   VITAMIN D  25 Hydroxy (Vit-D Deficiency, Fractures)     Follow-up: Return in about 3 months (around 02/10/2025) for chronic fasting follow-up.  An After Visit Summary was printed and given to the patient.  CAMIE JONELLE NICHOLAUS DEVONNA [facility] [phone]

## 2024-11-11 ENCOUNTER — Telehealth (HOSPITAL_BASED_OUTPATIENT_CLINIC_OR_DEPARTMENT_OTHER): Payer: Self-pay

## 2024-11-11 ENCOUNTER — Other Ambulatory Visit: Payer: Self-pay | Admitting: Physician Assistant

## 2024-11-11 ENCOUNTER — Ambulatory Visit: Payer: Self-pay | Admitting: Physician Assistant

## 2024-11-11 ENCOUNTER — Other Ambulatory Visit (HOSPITAL_BASED_OUTPATIENT_CLINIC_OR_DEPARTMENT_OTHER): Payer: Self-pay

## 2024-11-11 DIAGNOSIS — I25119 Atherosclerotic heart disease of native coronary artery with unspecified angina pectoris: Secondary | ICD-10-CM

## 2024-11-11 DIAGNOSIS — E782 Mixed hyperlipidemia: Secondary | ICD-10-CM

## 2024-11-11 DIAGNOSIS — E559 Vitamin D deficiency, unspecified: Secondary | ICD-10-CM

## 2024-11-11 LAB — CBC WITH DIFFERENTIAL/PLATELET
Basophils Absolute: 0.1 x10E3/uL (ref 0.0–0.2)
Basos: 1 %
EOS (ABSOLUTE): 0.2 x10E3/uL (ref 0.0–0.4)
Eos: 2 %
Hematocrit: 41.1 % (ref 34.0–46.6)
Hemoglobin: 13.3 g/dL (ref 11.1–15.9)
Immature Grans (Abs): 0 x10E3/uL (ref 0.0–0.1)
Immature Granulocytes: 0 %
Lymphocytes Absolute: 2.1 x10E3/uL (ref 0.7–3.1)
Lymphs: 24 %
MCH: 29.6 pg (ref 26.6–33.0)
MCHC: 32.4 g/dL (ref 31.5–35.7)
MCV: 92 fL (ref 79–97)
Monocytes Absolute: 0.5 x10E3/uL (ref 0.1–0.9)
Monocytes: 6 %
Neutrophils Absolute: 5.9 x10E3/uL (ref 1.4–7.0)
Neutrophils: 66 %
Platelets: 303 x10E3/uL (ref 150–450)
RBC: 4.49 x10E6/uL (ref 3.77–5.28)
RDW: 13.7 % (ref 11.7–15.4)
WBC: 8.9 x10E3/uL (ref 3.4–10.8)

## 2024-11-11 LAB — LIPID PANEL
Chol/HDL Ratio: 3.8 ratio (ref 0.0–4.4)
Cholesterol, Total: 207 mg/dL — ABNORMAL HIGH (ref 100–199)
HDL: 55 mg/dL (ref >39)
LDL Chol Calc (NIH): 73 mg/dL (ref 0–99)
Triglycerides: 506 mg/dL — ABNORMAL HIGH (ref 0–149)
VLDL Cholesterol Cal: 79 mg/dL — ABNORMAL HIGH (ref 5–40)

## 2024-11-11 LAB — COMPREHENSIVE METABOLIC PANEL WITH GFR
ALT: 14 IU/L (ref 0–32)
AST: 14 IU/L (ref 0–40)
Albumin: 3.9 g/dL (ref 3.9–4.9)
Alkaline Phosphatase: 71 IU/L (ref 49–135)
BUN/Creatinine Ratio: 25 (ref 12–28)
BUN: 23 mg/dL (ref 8–27)
Bilirubin Total: 0.3 mg/dL (ref 0.0–1.2)
CO2: 23 mmol/L (ref 20–29)
Calcium: 10.1 mg/dL (ref 8.7–10.3)
Chloride: 104 mmol/L (ref 96–106)
Creatinine, Ser: 0.92 mg/dL (ref 0.57–1.00)
Globulin, Total: 3.1 g/dL (ref 1.5–4.5)
Glucose: 140 mg/dL — ABNORMAL HIGH (ref 70–99)
Potassium: 5.7 mmol/L — ABNORMAL HIGH (ref 3.5–5.2)
Sodium: 141 mmol/L (ref 134–144)
Total Protein: 7 g/dL (ref 6.0–8.5)
eGFR: 70 mL/min/1.73 (ref >59)

## 2024-11-11 LAB — HEMOGLOBIN A1C
Est. average glucose Bld gHb Est-mCnc: 189 mg/dL
Hgb A1c MFr Bld: 8.2 % — ABNORMAL HIGH (ref 4.8–5.6)

## 2024-11-11 LAB — TSH: TSH: 4.3 u[IU]/mL (ref 0.450–4.500)

## 2024-11-11 LAB — VITAMIN D 25 HYDROXY (VIT D DEFICIENCY, FRACTURES): Vit D, 25-Hydroxy: 14.9 ng/mL — ABNORMAL LOW (ref 30.0–100.0)

## 2024-11-11 MED ORDER — REPATHA SURECLICK 140 MG/ML ~~LOC~~ SOAJ
140.0000 mg | SUBCUTANEOUS | 5 refills | Status: AC
  Filled 2024-11-11 – 2024-11-16 (×3): qty 2, 28d supply, fill #0

## 2024-11-11 MED ORDER — VITAMIN D (ERGOCALCIFEROL) 1.25 MG (50000 UNIT) PO CAPS
50000.0000 [IU] | ORAL_CAPSULE | ORAL | 1 refills | Status: AC
  Filled 2024-11-11: qty 12, 84d supply, fill #0

## 2024-11-16 ENCOUNTER — Other Ambulatory Visit (HOSPITAL_COMMUNITY): Payer: Self-pay

## 2024-11-16 ENCOUNTER — Telehealth (HOSPITAL_BASED_OUTPATIENT_CLINIC_OR_DEPARTMENT_OTHER): Payer: Self-pay

## 2024-11-16 ENCOUNTER — Ambulatory Visit: Payer: Self-pay

## 2024-11-16 ENCOUNTER — Other Ambulatory Visit (HOSPITAL_BASED_OUTPATIENT_CLINIC_OR_DEPARTMENT_OTHER): Payer: Self-pay

## 2024-11-16 DIAGNOSIS — F419 Anxiety disorder, unspecified: Secondary | ICD-10-CM | POA: Diagnosis not present

## 2024-11-16 DIAGNOSIS — I472 Ventricular tachycardia, unspecified: Secondary | ICD-10-CM | POA: Diagnosis not present

## 2024-11-16 DIAGNOSIS — N390 Urinary tract infection, site not specified: Secondary | ICD-10-CM | POA: Diagnosis not present

## 2024-11-16 DIAGNOSIS — L03115 Cellulitis of right lower limb: Secondary | ICD-10-CM | POA: Diagnosis not present

## 2024-11-16 DIAGNOSIS — I252 Old myocardial infarction: Secondary | ICD-10-CM | POA: Diagnosis not present

## 2024-11-16 DIAGNOSIS — S91341D Puncture wound with foreign body, right foot, subsequent encounter: Secondary | ICD-10-CM | POA: Diagnosis not present

## 2024-11-16 DIAGNOSIS — I5032 Chronic diastolic (congestive) heart failure: Secondary | ICD-10-CM | POA: Diagnosis not present

## 2024-11-16 DIAGNOSIS — M86171 Other acute osteomyelitis, right ankle and foot: Secondary | ICD-10-CM | POA: Diagnosis not present

## 2024-11-16 DIAGNOSIS — E1140 Type 2 diabetes mellitus with diabetic neuropathy, unspecified: Secondary | ICD-10-CM | POA: Diagnosis not present

## 2024-11-16 DIAGNOSIS — E538 Deficiency of other specified B group vitamins: Secondary | ICD-10-CM | POA: Diagnosis not present

## 2024-11-16 DIAGNOSIS — B951 Streptococcus, group B, as the cause of diseases classified elsewhere: Secondary | ICD-10-CM | POA: Diagnosis not present

## 2024-11-16 DIAGNOSIS — I25119 Atherosclerotic heart disease of native coronary artery with unspecified angina pectoris: Secondary | ICD-10-CM | POA: Diagnosis not present

## 2024-11-16 DIAGNOSIS — E1151 Type 2 diabetes mellitus with diabetic peripheral angiopathy without gangrene: Secondary | ICD-10-CM | POA: Diagnosis not present

## 2024-11-16 DIAGNOSIS — E785 Hyperlipidemia, unspecified: Secondary | ICD-10-CM | POA: Diagnosis not present

## 2024-11-16 DIAGNOSIS — I110 Hypertensive heart disease with heart failure: Secondary | ICD-10-CM | POA: Diagnosis not present

## 2024-11-16 DIAGNOSIS — D649 Anemia, unspecified: Secondary | ICD-10-CM | POA: Diagnosis not present

## 2024-11-16 DIAGNOSIS — L02611 Cutaneous abscess of right foot: Secondary | ICD-10-CM | POA: Diagnosis not present

## 2024-11-16 DIAGNOSIS — J439 Emphysema, unspecified: Secondary | ICD-10-CM | POA: Diagnosis not present

## 2024-11-16 DIAGNOSIS — E1165 Type 2 diabetes mellitus with hyperglycemia: Secondary | ICD-10-CM | POA: Diagnosis not present

## 2024-11-16 DIAGNOSIS — I2510 Atherosclerotic heart disease of native coronary artery without angina pectoris: Secondary | ICD-10-CM | POA: Diagnosis not present

## 2024-11-16 DIAGNOSIS — Z7984 Long term (current) use of oral hypoglycemic drugs: Secondary | ICD-10-CM | POA: Diagnosis not present

## 2024-11-16 DIAGNOSIS — E559 Vitamin D deficiency, unspecified: Secondary | ICD-10-CM | POA: Diagnosis not present

## 2024-11-16 DIAGNOSIS — E1169 Type 2 diabetes mellitus with other specified complication: Secondary | ICD-10-CM | POA: Diagnosis not present

## 2024-11-16 DIAGNOSIS — M21172 Varus deformity, not elsewhere classified, left ankle: Secondary | ICD-10-CM | POA: Diagnosis not present

## 2024-11-16 DIAGNOSIS — M71071 Abscess of bursa, right ankle and foot: Secondary | ICD-10-CM | POA: Diagnosis not present

## 2024-11-16 NOTE — Telephone Encounter (Signed)
 PA request has been Received. New Encounter has been or will be created for follow up. For additional info see Pharmacy Prior Auth telephone encounter from 11/16/24.

## 2024-11-16 NOTE — Telephone Encounter (Signed)
 Pharmacy Patient Advocate Encounter  Received notification from [facility] that Prior Authorization for Repatha  SureClick 140MG /ML auto-injectors  has been APPROVED from 11/16/24 to 05/17/25. Ran test claim, Copay is $47. This test claim was processed through [facility]- copay amounts may vary at other pharmacies due to pharmacy/plan contracts, or as the patient moves through the different stages of their insurance plan.   PA #/Case ID/Reference #: [id number]

## 2024-11-16 NOTE — Telephone Encounter (Signed)
 Pharmacy Patient Advocate Encounter   Received notification from Pt Calls Messages that prior authorization for Repatha  SureClick 140MG /ML auto-injectors  is required/requested.   Insurance verification completed.   The patient is insured through [place].   Per test claim: PA required; PA submitted to above mentioned insurance via Latent Key/confirmation #/EOC [id number] Status is pending

## 2024-11-17 LAB — CUP PACEART REMOTE DEVICE CHECK
Battery Remaining Longevity: 71 mo
Battery Remaining Percentage: 64 %
Battery Voltage: 2.99 V
Brady Statistic AP VP Percent: 1 %
Brady Statistic AP VS Percent: 1.5 %
Brady Statistic AS VP Percent: 1 %
Brady Statistic AS VS Percent: 98 %
Brady Statistic RA Percent Paced: 1.5 %
Brady Statistic RV Percent Paced: 1 %
Date Time Interrogation Session: 20251128093124
HighPow Impedance: 69 Ohm
Implantable Lead Connection Status: 753985
Implantable Lead Connection Status: 753985
Implantable Lead Implant Date: 20220103
Implantable Lead Implant Date: 20220103
Implantable Lead Location: 753859
Implantable Lead Location: 753860
Implantable Pulse Generator Implant Date: 20220103
Lead Channel Impedance Value: 340 Ohm
Lead Channel Impedance Value: 400 Ohm
Lead Channel Pacing Threshold Amplitude: 0.75 V
Lead Channel Pacing Threshold Amplitude: 1 V
Lead Channel Pacing Threshold Pulse Width: 0.5 ms
Lead Channel Pacing Threshold Pulse Width: 0.5 ms
Lead Channel Sensing Intrinsic Amplitude: 12 mV
Lead Channel Sensing Intrinsic Amplitude: 3.4 mV
Lead Channel Setting Pacing Amplitude: 2 V
Lead Channel Setting Pacing Amplitude: 2.5 V
Lead Channel Setting Pacing Pulse Width: 0.5 ms
Lead Channel Setting Sensing Sensitivity: 0.5 mV
Pulse Gen Serial Number: 111037319

## 2024-11-18 ENCOUNTER — Other Ambulatory Visit (HOSPITAL_BASED_OUTPATIENT_CLINIC_OR_DEPARTMENT_OTHER): Payer: Self-pay

## 2024-11-19 ENCOUNTER — Ambulatory Visit: Payer: Self-pay | Admitting: Internal Medicine

## 2024-11-19 DIAGNOSIS — L97413 Non-pressure chronic ulcer of right heel and midfoot with necrosis of muscle: Secondary | ICD-10-CM | POA: Diagnosis not present

## 2024-11-20 NOTE — Progress Notes (Signed)
 Remote ICD Transmission

## 2024-11-21 ENCOUNTER — Ambulatory Visit (INDEPENDENT_AMBULATORY_CARE_PROVIDER_SITE_OTHER): Admit: 2024-11-21 | Discharge: 2024-11-21 | Disposition: A | Admitting: Radiology

## 2024-11-21 ENCOUNTER — Encounter (HOSPITAL_BASED_OUTPATIENT_CLINIC_OR_DEPARTMENT_OTHER): Payer: Self-pay

## 2024-11-21 ENCOUNTER — Ambulatory Visit (HOSPITAL_BASED_OUTPATIENT_CLINIC_OR_DEPARTMENT_OTHER): Admission: RE | Admit: 2024-11-21 | Discharge: 2024-11-21 | Disposition: A | Discharge status: Home / Self Care

## 2024-11-21 VITALS — BP 131/83 | HR 82 | Temp 98.4°F | Resp 20

## 2024-11-21 DIAGNOSIS — S300XXA Contusion of lower back and pelvis, initial encounter: Secondary | ICD-10-CM

## 2024-11-21 DIAGNOSIS — M533 Sacrococcygeal disorders, not elsewhere classified: Secondary | ICD-10-CM

## 2024-11-21 MED ORDER — KETOROLAC TROMETHAMINE 30 MG/ML IJ SOLN
30.0000 mg | Freq: Once | INTRAMUSCULAR | Status: AC
  Administered 2024-11-21: 30 mg via INTRAMUSCULAR

## 2024-11-21 NOTE — ED Triage Notes (Signed)
 Patient fell on Monday. Reporting pain to tailbone and butt.  Patient easily able to cross both legs over the other without pain. Patient able to ambulate but with some discomfort to low back. Nothing for pain taken today.

## 2024-11-21 NOTE — ED Provider Notes (Signed)
 [facility]  Note:  This document was prepared using Conservation officer, historic buildings and may include unintentional dictation errors.  MRN: [id number] DOB: 12-20-1960  Subjective:   Jasmine Richardson is a 63 y.o. female presenting for patient follow-up of fall occurred patient reports she fell backwards landing on her tailbone, since the fall she has had continued pain.  Patient denies any pain to bilateral hips all of her pain is directly in the middle of.  Patient reports some mild lower back pain with ambulation.  Patient denies taking any over-the-counter medication to treat pain symptoms prior to arrival in urgent care.  Patient is concern for possible tailbone fracture.  No current facility-administered medications for this encounter.  Current Outpatient Medications:    acetaminophen  (TYLENOL ) 325 MG tablet, Take 2 tablets (650 mg total) by mouth every 4 (four) hours as needed for headache or mild pain., Disp: , Rfl:    amiodarone  (PACERONE ) 200 MG tablet, Take 1 tablet (200 mg total) by mouth daily. Must have appt for refills., Disp: 30 tablet, Rfl: 0   aspirin  81 MG chewable tablet, Chew 81 mg by mouth daily., Disp: , Rfl:    clopidogrel  (PLAVIX ) 75 MG tablet, Take 1 tablet (75 mg total) by mouth daily., Disp: 90 tablet, Rfl: 3   Continuous Glucose Sensor (DEXCOM G7 SENSOR) MISC, Change sensor every 10 days as directed, Disp: 3 each, Rfl: 12   cyclobenzaprine  (FLEXERIL ) 5 MG tablet, Take 1 tablet (5 mg total) by mouth at bedtime., Disp: 30 tablet, Rfl: 1   Evolocumab  (REPATHA  SURECLICK) 140 MG/ML SOAJ, Inject 140 mg into the skin every 14 (fourteen) days., Disp: 2 mL, Rfl: 5   FARXIGA  10 MG TABS tablet, Take 1 tablet (10 mg total) by mouth daily., Disp: 90 tablet, Rfl: 0   furosemide  (LASIX ) 20 MG tablet, Take 1 tablet (20 mg total) by mouth daily., Disp: 90 tablet, Rfl: 3   HUMALOG  KWIKPEN 100 UNIT/ML KwikPen, Inject 1-20 Units into the skin at bedtime. CBG 70 - 120: 0 units  CBG 121 - 150: 3 units CBG 151 - 200: 4 units CBG 201 - 250: 7 units CBG 251 - 300: 11 units CBG 301 - 350: 15 units CBG 351 - 400: 20 units CBG > 400: call MD and obtain STAT lab verification, Disp: 30 mL, Rfl: 2   insulin  glargine, 1 Unit Dial , (TOUJEO  SOLOSTAR) 300 UNIT/ML Solostar Pen, Inject 63 Units into the skin daily., Disp: 18 mL, Rfl: 1   Insulin  Pen Needle (INSUPEN PEN NEEDLES) 32G X 4 MM MISC, Use as directed once daily with insulin ., Disp: 100 each, Rfl: 1   ketoconazole  (NIZORAL ) 2 % cream, Apply 1 Application topically daily., Disp: 30 g, Rfl: 1   metFORMIN  (GLUCOPHAGE ) 500 MG tablet, Take 1 tablet (500 mg total) by mouth 2 (two) times daily with a meal., Disp: 180 tablet, Rfl: 0   metoprolol  succinate (TOPROL -XL) 25 MG 24 hr tablet, TAKE 1 TABLET BY MOUTH IN THE MORNING, AT NOON AND AT BEDTIME, Disp: 270 tablet, Rfl: 0   nitroGLYCERIN  (NITROSTAT ) 0.4 MG SL tablet, Place 1 tablet (0.4 mg total) under the tongue every 5 (five) minutes as needed for chest pain., Disp: 25 tablet, Rfl: 6   nystatin  (MYCOSTATIN /NYSTOP ) powder, Apply 1 Application topically daily as needed (rash/yeast)., Disp: 15 g, Rfl: 2   rosuvastatin  (CRESTOR ) 20 MG tablet, Take 1 tablet (20 mg total) by mouth daily., Disp: 90 tablet, Rfl: 2   SANTYL 250 UNIT/GM ointment,  Apply 1 Application topically daily., Disp: , Rfl:    valsartan  (DIOVAN ) 80 MG tablet, Take 1 tablet (80 mg total) by mouth 2 (two) times daily., Disp: 180 tablet, Rfl: 2   Vitamin D , Ergocalciferol , (DRISDOL ) 1.25 MG (50000 UNIT) CAPS capsule, Take 1 capsule (50,000 Units total) by mouth every 7 (seven) days., Disp: 12 capsule, Rfl: 1   Allergies  Allergen Reactions   Vancomycin  Anaphylaxis   Chlorhexidine  Gluconate Itching    Received CHG bath, began itching, required benadryl    Ativan  [Lorazepam ] Other (See Comments)    hallucinations   Cat Dander Other (See Comments)    Sneezing, watery eyes.   Other Itching    Dial  Soap   Tramadol  Other  (See Comments)    Hallucinations   Codeine Rash    Past Medical History:  Diagnosis Date   Abnormal EKG 10/23/2021   Abnormal mammogram 08/09/2023   Abnormal mammogram of left breast 09/04/2021   Abnormal stress test 10/23/2021   Abscess of bursa of right foot 06/12/2024   Abscess of left elbow 02/18/2024   Abscess of right foot 06/10/2024   Absolute anemia 09/20/2022   Acquired varus deformity of foot, left    Acute chest pain 05/08/2016   Acute hemorrhagic cystitis 08/09/2023   Acute osteomyelitis of right calcaneus (HCC) 06/15/2024   AICD (automatic cardioverter/defibrillator) present    St Jude/Abbott device   AKI (acute kidney injury) 10/23/2021   Amputation of right great toe 09/20/2022   Angina pectoris 09/11/2017   Anxiety 10/23/2021   B12 deficiency 09/20/2022   Bacteremia 11/22/2021   Breast wound, right, subsequent encounter 09/04/2021   Burping 05/08/2016   Cancer (HCC)    cervical - hysterectomy   Cellulitis of right breast 10/23/2021   Cellulitis of right foot 06/15/2024   Cellulitis, wound, post-operative 10/09/2021   Chronic diastolic heart failure (HCC) 11/06/2016   Chronic osteomyelitis of left foot (HCC) 10/16/2023   Colon cancer screening 09/20/2022   Coronary artery disease involving native coronary artery of native heart with angina pectoris 09/12/2015   Overview:  PCI and stent of RCA 2009, last cath 2012 with medical therapy  CABG May 2017   Demand ischemia [facility])    Diabetes mellitus due to underlying condition with hyperglycemia, with long-term current use of insulin  (HCC) 09/20/2022   Diabetic foot infection (HCC) 11/21/2021   Diabetic foot ulcer (HCC) 02/16/2020   Emphysema lung (HCC) 09/11/2017   patient not aware of this dx   Encounter for preoperative assessment 10/16/2023   Essential hypertension 09/12/2015   Gangrene (HCC) 10/23/2021   GERD (gastroesophageal reflux disease) 05/08/2016   Hematoma 10/23/2021   Hiatal hernia 10/23/2021    History of methicillin resistant staphylococcus aureus (MRSA)    Hyperglycemia 10/23/2021   Hyperglycemia due to type 2 diabetes mellitus (HCC) 10/23/2021   Hyperkalemia    Hyperlipidemia 09/12/2015   Hyponatremia 10/23/2021   ICD (implantable cardioverter-defibrillator) in place 05/16/2021   Infection of great toe 10/23/2021   Intertrigo 09/20/2022   Lactic acidosis 10/23/2021   Leukocytosis 10/23/2021   Mediastinitis 06/28/2016   Morbid obesity (HCC) 05/08/2016   Myocardial infarction (HCC)    Needs flu shot 09/20/2022   NSTEMI (non-ST elevated myocardial infarction) (HCC) 05/22/2020   Obesity (BMI 30-39.9) 10/23/2021   Partial nontraumatic amputation of left foot (HCC) 09/20/2022   Perineal abscess 10/23/2021   Peripheral vascular disease    Right foot infection 06/10/2024   S/P CABG (coronary artery bypass graft) 06/03/2016   Overview:  The patient underwent sternal reconstruction on 06/21/16 with pec flaps for mediastinitis from a prior CABG in May 2017. On admission, she was critically ill from sepsis and had altered mental status. She was last seen in clinic on 08/09/16 at which time she was doing well.   Severe sepsis (HCC) 06/03/2016   Sinus tachycardia 05/08/2016   Tobacco use disorder 04/20/2016   Overview:  Quit in May 2017.   Type 2 diabetes mellitus with foot ulcer (HCC) 05/08/2016   Uncontrolled type 2 diabetes mellitus with hyperglycemia (HCC) 02/18/2024   Unstable angina (HCC) 05/22/2020   Ventricular tachycardia (HCC) 12/17/2020   Vitamin D  deficiency 09/20/2022   Wound, surgical, infected 06/06/2016   Overview:  sternal     Past Surgical History:  Procedure Laterality Date   ABDOMINAL AORTOGRAM W/LOWER EXTREMITY N/A 10/11/2021   Procedure: ABDOMINAL AORTOGRAM W/LOWER EXTREMITY;  Surgeon: Gretta Lonni PARAS, MD;  Location: [facility];  Service: Cardiovascular;  Laterality: N/A;   AMPUTATION TOE Right 02/20/2020   Procedure: AMPUTATION RIGHT GREAT  TOE;   Surgeon: Tobie Franky SQUIBB, DPM;  Location: [facility] OR;  Service: Podiatry;  Laterality: Right;   BLADDER SURGERY     BONE BIOPSY Left 02/01/2022   Procedure: BONE BIOPSY LEFT FOOT, IRRIGATION AND DEBRIDEMENT;  Surgeon: Burt Fus, DPM;  Location: [facility];  Service: Podiatry;  Laterality: Left;   BONE BIOPSY Left 10/31/2023   Procedure: BONE BIOPSY;  Surgeon: Janit Thresa HERO, DPM;  Location: [facility]S;  Service: Orthopedics/Podiatry;  Laterality: Left;   BUBBLE STUDY  11/27/2021   Procedure: BUBBLE STUDY;  Surgeon: Okey Vina GAILS, MD;  Location: [facility] ENDOSCOPY;  Service: Cardiovascular;;   CARDIAC CATHETERIZATION     CORONARY ARTERY BYPASS GRAFT     CORONARY STENT INTERVENTION N/A 05/25/2020   Procedure: CORONARY STENT INTERVENTION;  Surgeon: Darron Deatrice LABOR, MD;  [facility]: [facility];  Service: Cardiovascular;  Laterality: N/A;   CORONARY STENT INTERVENTION N/A 06/22/2020   Procedure: CORONARY STENT INTERVENTION;  Surgeon: Anner Alm ORN, MD;  Location: [facility] INVASIVE CV LAB;  Service: Cardiovascular;  Laterality: N/A;   CORONARY ULTRASOUND/IVUS N/A 06/22/2020   Procedure: Intravascular Ultrasound/IVUS;  Surgeon: Anner Alm ORN, MD;  Location: [facility] INVASIVE CV LAB;  Service: Cardiovascular;  Laterality: N/A;   ICD IMPLANT N/A 12/19/2020   Procedure: ICD IMPLANT;  Surgeon: Waddell Danelle ORN, MD;  Location: [facility] INVASIVE CV LAB;  Service: Cardiovascular;  Laterality: N/A;   INCISION AND DRAINAGE Right 02/19/2020   Procedure: INCISION AND DRAINAGE;  Surgeon: Gershon Donnice SAUNDERS, DPM;  Location: [facility];  Service: Podiatry;  Laterality: Right;  Block done by surgeon   INCISION AND DRAINAGE OF WOUND Left 10/31/2023   Procedure: IRRIGATION AND DEBRIDEMENT WOUND;  Surgeon: Janit Thresa HERO, DPM;  Location: [facility];  Service: Orthopedics/Podiatry;  Laterality: Left;   IRRIGATION AND DEBRIDEMENT ABSCESS Right 06/12/2024   Procedure: IRRIGATION AND DEBRIDEMENT ABSCESS;  Surgeon: Malvin Marsa FALCON, DPM;  Location: [facility];   Service: Orthopedics/Podiatry;  Laterality: Right;  I&D R heel, possible bone biopsy   IRRIGATION AND DEBRIDEMENT FOOT Left 10/10/2021   Procedure: IRRIGATION AND DEBRIDEMENT FOOT;  Surgeon: Burt Fus, DPM;  Location: [facility];  Service: Podiatry;  Laterality: Left;   IRRIGATION AND DEBRIDEMENT FOOT Left 11/23/2021   Procedure: IRRIGATION AND DEBRIDEMENT FOOT;  Surgeon: Joya Stabs, MD;  Location: [facility];  Service: Podiatry;  Laterality: Left;  will do local block   LEFT HEART CATH AND CORS/GRAFTS ANGIOGRAPHY N/A 05/23/2020  Procedure: LEFT HEART CATH AND CORS/GRAFTS ANGIOGRAPHY;  Surgeon: Mady Bruckner, MD;  [facility];  Service: Cardiovascular;  Laterality: N/A;   LEFT HEART CATH AND CORS/GRAFTS ANGIOGRAPHY N/A 12/19/2020   Procedure: LEFT HEART CATH AND CORS/GRAFTS ANGIOGRAPHY;  Surgeon: Verlin Bruckner BIRCH, MD;  [facility];  Service: Cardiovascular;  Laterality: N/A;   LEFT HEART CATH AND CORS/GRAFTS ANGIOGRAPHY N/A 07/21/2021   Procedure: LEFT HEART CATH AND CORS/GRAFTS ANGIOGRAPHY;  Surgeon: Mady Bruckner, MD;  [facility];  Service: Cardiovascular;  Laterality: N/A;   METATARSAL HEAD EXCISION Right 05/02/2020   Procedure: FIRST METATARSAL HEAD RESECTION; RIGHT FOOT WOUND CLOSURE;  Surgeon: Burt Fus, DPM;  Location: [facility];  Service: Podiatry;  Laterality: Right;  MAC W/LOCAL   PERIPHERAL VASCULAR BALLOON ANGIOPLASTY Left 10/11/2021   Procedure: PERIPHERAL VASCULAR BALLOON ANGIOPLASTY;  Surgeon: Gretta Bruckner PARAS, MD;  [facility]: [facility];  Service: Cardiovascular;  Laterality: Left;  Anterior Tibial Artery   TEE WITHOUT CARDIOVERSION N/A 11/27/2021   Procedure: TRANSESOPHAGEAL ECHOCARDIOGRAM (TEE);  Surgeon: Okey Vina GAILS, MD;  Location: [facility] ENDOSCOPY;  Service: Cardiovascular;  Laterality: N/A;   TIBIALIS TENDON TRANSFER / REPAIR Left 12/06/2023   Procedure: TIBIALIS TENDON TRANSFER;  Surgeon:  Janit Thresa HERO, DPM;  Location: [facility] ORS;  Service: Orthopedics/Podiatry;  Laterality: Left;   TRANSMETATARSAL AMPUTATION Left 11/23/2021   Procedure: TRANSMETATARSAL AMPUTATION;  Surgeon: Joya Stabs, MD;  Location: [facility] OR;  Service: Podiatry;  Laterality: Left;   TUBAL LIGATION      Family History  Problem Relation Age of Onset   Hypertension Mother    Hyperlipidemia Mother    Heart attack Father    Heart disease Father    Hypertension Father    Alzheimer's disease Father    Hypertension Brother    Hyperlipidemia Brother     Social History   Tobacco Use   Smoking status: Former    Current packs/day: 0.00    Types: Cigarettes    Quit date: 05/2016    Years since quitting: 8.5   Smokeless tobacco: Never  Vaping Use   Vaping status: Never Used  Substance Use Topics   Alcohol use: Yes    Comment: rare   Drug use: No    ROS Refer to HPI for ROS details.  Objective:    Vitals: BP 131/83 (BP Location: Left Arm)   Pulse 82   Temp 98.4 F (36.9 C) (Oral)   Resp 20   LMP  (LMP Unknown)   SpO2 99%   Physical Exam Vitals and nursing note reviewed.  Constitutional:      General: She is not in acute distress.    Appearance: Normal appearance. She is well-developed. She is not ill-appearing or toxic-appearing.  HENT:     Head: Normocephalic and atraumatic.  Cardiovascular:     Rate and Rhythm: Normal rate.  Pulmonary:     Effort: Pulmonary effort is normal. No respiratory distress.     Breath sounds: No stridor. No wheezing.  Musculoskeletal:     Lumbar back: Tenderness and bony tenderness present. No swelling, deformity or spasms. Normal range of motion. Negative right straight leg raise test and negative left straight leg raise test.       Back:  Skin:    General: Skin is warm and dry.  Neurological:     General: No focal deficit present.     Mental Status: She is alert and oriented to person, place, and  time.  Psychiatric:        Mood and Affect: Mood  normal.        Behavior: Behavior normal.     Procedures  No results found for this or any previous visit (from the past 24 hours).  Assessment and Plan :     Discharge Instructions       1. Coccyx contusion, initial encounter (Primary) - DG Sacrum/Coccyx x-ray performed in UC shows no acute fracture or dislocation to the coccyx or sacrum. - ketorolac  (TORADOL ) 30 MG/ML injection 30 mg given in UC for acute pain secondary to fall with sacral/coccyx contusion. - Trying to buy a doughnut cushion to help with sitting to prevent further injury to the area. - Take Tylenol  as directed as needed for further pain secondary to coccyx contusion.  -Continue to monitor symptoms for any change in severity if there is any escalation of current symptoms or development of new symptoms follow-up in ER for further evaluation and management.      Ventura Leggitt B Collis Thede   Jerae Izard, [place] B, [place] 11/21/24 1523

## 2024-11-21 NOTE — Discharge Instructions (Signed)
  1. Coccyx contusion, initial encounter (Primary) - DG Sacrum/Coccyx x-ray performed in UC shows no acute fracture or dislocation to the coccyx or sacrum. - ketorolac  (TORADOL ) 30 MG/ML injection 30 mg given in UC for acute pain secondary to fall with sacral/coccyx contusion. - Trying to buy a doughnut cushion to help with sitting to prevent further injury to the area. - Take Tylenol  as directed as needed for further pain secondary to coccyx contusion.  -Continue to monitor symptoms for any change in severity if there is any escalation of current symptoms or development of new symptoms follow-up in ER for further evaluation and management.

## 2024-11-23 ENCOUNTER — Other Ambulatory Visit: Payer: Self-pay

## 2024-11-23 DIAGNOSIS — R899 Unspecified abnormal finding in specimens from other organs, systems and tissues: Secondary | ICD-10-CM

## 2024-11-24 DIAGNOSIS — L02611 Cutaneous abscess of right foot: Secondary | ICD-10-CM | POA: Diagnosis not present

## 2024-11-24 DIAGNOSIS — E1169 Type 2 diabetes mellitus with other specified complication: Secondary | ICD-10-CM | POA: Diagnosis not present

## 2024-11-24 DIAGNOSIS — M86171 Other acute osteomyelitis, right ankle and foot: Secondary | ICD-10-CM | POA: Diagnosis not present

## 2024-11-24 DIAGNOSIS — S91341D Puncture wound with foreign body, right foot, subsequent encounter: Secondary | ICD-10-CM | POA: Diagnosis not present

## 2024-11-24 DIAGNOSIS — M71071 Abscess of bursa, right ankle and foot: Secondary | ICD-10-CM | POA: Diagnosis not present

## 2024-11-24 DIAGNOSIS — B951 Streptococcus, group B, as the cause of diseases classified elsewhere: Secondary | ICD-10-CM | POA: Diagnosis not present

## 2024-11-24 DIAGNOSIS — E538 Deficiency of other specified B group vitamins: Secondary | ICD-10-CM | POA: Diagnosis not present

## 2024-11-24 DIAGNOSIS — F419 Anxiety disorder, unspecified: Secondary | ICD-10-CM | POA: Diagnosis not present

## 2024-11-24 DIAGNOSIS — L03115 Cellulitis of right lower limb: Secondary | ICD-10-CM | POA: Diagnosis not present

## 2024-11-24 DIAGNOSIS — E1151 Type 2 diabetes mellitus with diabetic peripheral angiopathy without gangrene: Secondary | ICD-10-CM | POA: Diagnosis not present

## 2024-11-24 DIAGNOSIS — N390 Urinary tract infection, site not specified: Secondary | ICD-10-CM | POA: Diagnosis not present

## 2024-11-24 DIAGNOSIS — Z7984 Long term (current) use of oral hypoglycemic drugs: Secondary | ICD-10-CM | POA: Diagnosis not present

## 2024-11-24 DIAGNOSIS — D649 Anemia, unspecified: Secondary | ICD-10-CM | POA: Diagnosis not present

## 2024-11-24 DIAGNOSIS — J439 Emphysema, unspecified: Secondary | ICD-10-CM | POA: Diagnosis not present

## 2024-11-24 DIAGNOSIS — M21172 Varus deformity, not elsewhere classified, left ankle: Secondary | ICD-10-CM | POA: Diagnosis not present

## 2024-11-24 DIAGNOSIS — E559 Vitamin D deficiency, unspecified: Secondary | ICD-10-CM | POA: Diagnosis not present

## 2024-11-24 DIAGNOSIS — I110 Hypertensive heart disease with heart failure: Secondary | ICD-10-CM | POA: Diagnosis not present

## 2024-11-24 DIAGNOSIS — I2510 Atherosclerotic heart disease of native coronary artery without angina pectoris: Secondary | ICD-10-CM | POA: Diagnosis not present

## 2024-11-24 DIAGNOSIS — E1165 Type 2 diabetes mellitus with hyperglycemia: Secondary | ICD-10-CM | POA: Diagnosis not present

## 2024-11-24 DIAGNOSIS — I472 Ventricular tachycardia, unspecified: Secondary | ICD-10-CM | POA: Diagnosis not present

## 2024-11-24 DIAGNOSIS — I252 Old myocardial infarction: Secondary | ICD-10-CM | POA: Diagnosis not present

## 2024-11-24 DIAGNOSIS — E785 Hyperlipidemia, unspecified: Secondary | ICD-10-CM | POA: Diagnosis not present

## 2024-11-24 DIAGNOSIS — E1140 Type 2 diabetes mellitus with diabetic neuropathy, unspecified: Secondary | ICD-10-CM | POA: Diagnosis not present

## 2024-11-24 DIAGNOSIS — I5032 Chronic diastolic (congestive) heart failure: Secondary | ICD-10-CM | POA: Diagnosis not present

## 2024-11-25 ENCOUNTER — Other Ambulatory Visit

## 2024-11-25 DIAGNOSIS — R899 Unspecified abnormal finding in specimens from other organs, systems and tissues: Secondary | ICD-10-CM | POA: Diagnosis not present

## 2024-11-25 LAB — BASIC METABOLIC PANEL WITH GFR
BUN/Creatinine Ratio: 20 (ref 12–28)
BUN: 23 mg/dL (ref 8–27)
CO2: 23 mmol/L (ref 20–29)
Calcium: 10.3 mg/dL (ref 8.7–10.3)
Chloride: 103 mmol/L (ref 96–106)
Creatinine, Ser: 1.17 mg/dL — ABNORMAL HIGH (ref 0.57–1.00)
Glucose: 176 mg/dL — ABNORMAL HIGH (ref 70–99)
Potassium: 5.3 mmol/L — ABNORMAL HIGH (ref 3.5–5.2)
Sodium: 141 mmol/L (ref 134–144)
eGFR: 52 mL/min/1.73 — ABNORMAL LOW (ref >59)

## 2024-11-26 ENCOUNTER — Ambulatory Visit: Payer: Self-pay | Admitting: Physician Assistant

## 2024-11-26 DIAGNOSIS — L97413 Non-pressure chronic ulcer of right heel and midfoot with necrosis of muscle: Secondary | ICD-10-CM | POA: Diagnosis not present

## 2024-11-30 DIAGNOSIS — F419 Anxiety disorder, unspecified: Secondary | ICD-10-CM | POA: Diagnosis not present

## 2024-11-30 DIAGNOSIS — E1165 Type 2 diabetes mellitus with hyperglycemia: Secondary | ICD-10-CM | POA: Diagnosis not present

## 2024-11-30 DIAGNOSIS — L03115 Cellulitis of right lower limb: Secondary | ICD-10-CM | POA: Diagnosis not present

## 2024-11-30 DIAGNOSIS — B951 Streptococcus, group B, as the cause of diseases classified elsewhere: Secondary | ICD-10-CM | POA: Diagnosis not present

## 2024-11-30 DIAGNOSIS — D649 Anemia, unspecified: Secondary | ICD-10-CM | POA: Diagnosis not present

## 2024-11-30 DIAGNOSIS — I472 Ventricular tachycardia, unspecified: Secondary | ICD-10-CM | POA: Diagnosis not present

## 2024-11-30 DIAGNOSIS — E785 Hyperlipidemia, unspecified: Secondary | ICD-10-CM | POA: Diagnosis not present

## 2024-11-30 DIAGNOSIS — M71071 Abscess of bursa, right ankle and foot: Secondary | ICD-10-CM | POA: Diagnosis not present

## 2024-11-30 DIAGNOSIS — E1169 Type 2 diabetes mellitus with other specified complication: Secondary | ICD-10-CM | POA: Diagnosis not present

## 2024-11-30 DIAGNOSIS — M21172 Varus deformity, not elsewhere classified, left ankle: Secondary | ICD-10-CM | POA: Diagnosis not present

## 2024-11-30 DIAGNOSIS — S91341D Puncture wound with foreign body, right foot, subsequent encounter: Secondary | ICD-10-CM | POA: Diagnosis not present

## 2024-11-30 DIAGNOSIS — E559 Vitamin D deficiency, unspecified: Secondary | ICD-10-CM | POA: Diagnosis not present

## 2024-11-30 DIAGNOSIS — I5032 Chronic diastolic (congestive) heart failure: Secondary | ICD-10-CM | POA: Diagnosis not present

## 2024-11-30 DIAGNOSIS — Z7984 Long term (current) use of oral hypoglycemic drugs: Secondary | ICD-10-CM | POA: Diagnosis not present

## 2024-11-30 DIAGNOSIS — E1140 Type 2 diabetes mellitus with diabetic neuropathy, unspecified: Secondary | ICD-10-CM | POA: Diagnosis not present

## 2024-11-30 DIAGNOSIS — L02611 Cutaneous abscess of right foot: Secondary | ICD-10-CM | POA: Diagnosis not present

## 2024-11-30 DIAGNOSIS — N390 Urinary tract infection, site not specified: Secondary | ICD-10-CM | POA: Diagnosis not present

## 2024-11-30 DIAGNOSIS — I252 Old myocardial infarction: Secondary | ICD-10-CM | POA: Diagnosis not present

## 2024-11-30 DIAGNOSIS — I2510 Atherosclerotic heart disease of native coronary artery without angina pectoris: Secondary | ICD-10-CM | POA: Diagnosis not present

## 2024-11-30 DIAGNOSIS — I110 Hypertensive heart disease with heart failure: Secondary | ICD-10-CM | POA: Diagnosis not present

## 2024-11-30 DIAGNOSIS — E1151 Type 2 diabetes mellitus with diabetic peripheral angiopathy without gangrene: Secondary | ICD-10-CM | POA: Diagnosis not present

## 2024-11-30 DIAGNOSIS — E538 Deficiency of other specified B group vitamins: Secondary | ICD-10-CM | POA: Diagnosis not present

## 2024-11-30 DIAGNOSIS — M86171 Other acute osteomyelitis, right ankle and foot: Secondary | ICD-10-CM | POA: Diagnosis not present

## 2024-11-30 DIAGNOSIS — J439 Emphysema, unspecified: Secondary | ICD-10-CM | POA: Diagnosis not present

## 2024-12-02 DIAGNOSIS — Z1231 Encounter for screening mammogram for malignant neoplasm of breast: Secondary | ICD-10-CM | POA: Diagnosis not present

## 2024-12-02 LAB — HM MAMMOGRAPHY

## 2024-12-03 ENCOUNTER — Encounter: Payer: Self-pay | Admitting: Physician Assistant

## 2024-12-03 DIAGNOSIS — E113312 Type 2 diabetes mellitus with moderate nonproliferative diabetic retinopathy with macular edema, left eye: Secondary | ICD-10-CM | POA: Diagnosis not present

## 2024-12-04 DIAGNOSIS — L97413 Non-pressure chronic ulcer of right heel and midfoot with necrosis of muscle: Secondary | ICD-10-CM | POA: Diagnosis not present

## 2024-12-07 DIAGNOSIS — E1151 Type 2 diabetes mellitus with diabetic peripheral angiopathy without gangrene: Secondary | ICD-10-CM | POA: Diagnosis not present

## 2024-12-07 DIAGNOSIS — I110 Hypertensive heart disease with heart failure: Secondary | ICD-10-CM | POA: Diagnosis not present

## 2024-12-07 DIAGNOSIS — E1140 Type 2 diabetes mellitus with diabetic neuropathy, unspecified: Secondary | ICD-10-CM | POA: Diagnosis not present

## 2024-12-07 DIAGNOSIS — N390 Urinary tract infection, site not specified: Secondary | ICD-10-CM | POA: Diagnosis not present

## 2024-12-07 DIAGNOSIS — M86171 Other acute osteomyelitis, right ankle and foot: Secondary | ICD-10-CM | POA: Diagnosis not present

## 2024-12-07 DIAGNOSIS — E538 Deficiency of other specified B group vitamins: Secondary | ICD-10-CM | POA: Diagnosis not present

## 2024-12-07 DIAGNOSIS — B951 Streptococcus, group B, as the cause of diseases classified elsewhere: Secondary | ICD-10-CM | POA: Diagnosis not present

## 2024-12-07 DIAGNOSIS — M71071 Abscess of bursa, right ankle and foot: Secondary | ICD-10-CM | POA: Diagnosis not present

## 2024-12-07 DIAGNOSIS — I2510 Atherosclerotic heart disease of native coronary artery without angina pectoris: Secondary | ICD-10-CM | POA: Diagnosis not present

## 2024-12-07 DIAGNOSIS — E559 Vitamin D deficiency, unspecified: Secondary | ICD-10-CM | POA: Diagnosis not present

## 2024-12-07 DIAGNOSIS — I252 Old myocardial infarction: Secondary | ICD-10-CM | POA: Diagnosis not present

## 2024-12-07 DIAGNOSIS — I5032 Chronic diastolic (congestive) heart failure: Secondary | ICD-10-CM | POA: Diagnosis not present

## 2024-12-07 DIAGNOSIS — L03115 Cellulitis of right lower limb: Secondary | ICD-10-CM | POA: Diagnosis not present

## 2024-12-07 DIAGNOSIS — D649 Anemia, unspecified: Secondary | ICD-10-CM | POA: Diagnosis not present

## 2024-12-07 DIAGNOSIS — L02611 Cutaneous abscess of right foot: Secondary | ICD-10-CM | POA: Diagnosis not present

## 2024-12-07 DIAGNOSIS — M21172 Varus deformity, not elsewhere classified, left ankle: Secondary | ICD-10-CM | POA: Diagnosis not present

## 2024-12-07 DIAGNOSIS — E1169 Type 2 diabetes mellitus with other specified complication: Secondary | ICD-10-CM | POA: Diagnosis not present

## 2024-12-07 DIAGNOSIS — F419 Anxiety disorder, unspecified: Secondary | ICD-10-CM | POA: Diagnosis not present

## 2024-12-07 DIAGNOSIS — J439 Emphysema, unspecified: Secondary | ICD-10-CM | POA: Diagnosis not present

## 2024-12-07 DIAGNOSIS — E785 Hyperlipidemia, unspecified: Secondary | ICD-10-CM | POA: Diagnosis not present

## 2024-12-07 DIAGNOSIS — S91341D Puncture wound with foreign body, right foot, subsequent encounter: Secondary | ICD-10-CM | POA: Diagnosis not present

## 2024-12-07 DIAGNOSIS — E1165 Type 2 diabetes mellitus with hyperglycemia: Secondary | ICD-10-CM | POA: Diagnosis not present

## 2024-12-07 DIAGNOSIS — Z7984 Long term (current) use of oral hypoglycemic drugs: Secondary | ICD-10-CM | POA: Diagnosis not present

## 2024-12-07 DIAGNOSIS — I472 Ventricular tachycardia, unspecified: Secondary | ICD-10-CM | POA: Diagnosis not present

## 2024-12-12 DIAGNOSIS — L97419 Non-pressure chronic ulcer of right heel and midfoot with unspecified severity: Secondary | ICD-10-CM | POA: Diagnosis not present

## 2024-12-12 DIAGNOSIS — T8131XA Disruption of external operation (surgical) wound, not elsewhere classified, initial encounter: Secondary | ICD-10-CM | POA: Diagnosis not present

## 2024-12-21 ENCOUNTER — Other Ambulatory Visit (HOSPITAL_BASED_OUTPATIENT_CLINIC_OR_DEPARTMENT_OTHER): Payer: Self-pay

## 2024-12-22 ENCOUNTER — Ambulatory Visit (INDEPENDENT_AMBULATORY_CARE_PROVIDER_SITE_OTHER)

## 2024-12-22 VITALS — BP 124/84 | Ht 67.0 in | Wt 213.6 lb

## 2024-12-22 DIAGNOSIS — Z0000 Encounter for general adult medical examination without abnormal findings: Secondary | ICD-10-CM | POA: Diagnosis not present

## 2024-12-22 NOTE — Progress Notes (Signed)
 "  Chief Complaint  Patient presents with   Medicare Wellness     Subjective:   Jasmine Richardson is a 64 y.o. female who presents for a Medicare Annual Wellness Visit.  Visit info / Clinical Intake: Medicare Wellness Visit Type:: Initial Annual Wellness Visit Persons participating in visit and providing information:: patient Medicare Wellness Visit Mode:: In-person (required for WTM) Interpreter Needed?: No Pre-visit prep was completed: yes AWV questionnaire completed by patient prior to visit?: no Living arrangements:: lives with spouse/significant other Patient's Overall Health Status Rating: very good Typical amount of pain: some Does pain affect daily life?: no Are you currently prescribed opioids?: no  Dietary Habits and Nutritional Risks How many meals a day?: 3 Eats fruit and vegetables daily?: yes Most meals are obtained by: preparing own meals; eating out In the last 2 weeks, have you had any of the following?: none Diabetic:: (!) yes Any non-healing wounds?: no How often do you check your BS?: continuous glucose monitor Would you like to be referred to a Nutritionist or for Diabetic Management? : no  Functional Status Activities of Daily Living (to include ambulation/medication): Independent Ambulation: Independent with device- listed below Home Assistive Devices/Equipment: Cane; Wheelchair Medication Administration: Independent Home Management (perform basic housework or laundry): Independent Manage your own finances?: yes Primary transportation is: driving Concerns about vision?: no *vision screening is required for WTM* Concerns about hearing?: no  Fall Screening Falls in the past year?: 1 Number of falls in past year: 0 Was there an injury with Fall?: 0 Fall Risk Category Calculator: 1 Patient Fall Risk Level: Low Fall Risk  Fall Risk Patient at Risk for Falls Due to: History of fall(s); Impaired mobility; Impaired balance/gait Fall risk Follow up: Falls  evaluation completed  Home and Transportation Safety: All rugs have non-skid backing?: N/A, no rugs All stairs or steps have railings?: N/A, no stairs Grab bars in the bathtub or shower?: yes Have non-skid surface in bathtub or shower?: yes Good home lighting?: yes Regular seat belt use?: yes Hospital stays in the last year:: no  Cognitive Assessment Difficulty concentrating, remembering, or making decisions? : no Will 6CIT or Mini Cog be Completed: yes What year is it?: 0 points What month is it?: 0 points Give patient an address phrase to remember (5 components): remember words apple, table , [place] About what time is it?: 0 points Count backwards from 20 to 1: 0 points Say the months of the year in reverse: 0 points Repeat the address phrase from earlier: 0 points 6 CIT Score: 0 points  Advance Directives (For Healthcare) Does Patient Have a Medical Advance Directive?: No Would patient like information on creating a medical advance directive?: No - Patient declined  Reviewed/Updated  Reviewed/Updated: Reviewed All (Medical, Surgical, Family, Medications, Allergies, Care Teams, Patient Goals)    Allergies (verified) Vancomycin , Chlorhexidine  gluconate, Ativan  [lorazepam ], Cat dander, Other, Tramadol , and Codeine   Current Medications (verified) Outpatient Encounter Medications as of 12/22/2024  Medication Sig   acetaminophen  (TYLENOL ) 325 MG tablet Take 2 tablets (650 mg total) by mouth every 4 (four) hours as needed for headache or mild pain.   amiodarone  (PACERONE ) 200 MG tablet Take 1 tablet (200 mg total) by mouth daily. Must have appt for refills.   aspirin  81 MG chewable tablet Chew 81 mg by mouth daily.   clopidogrel  (PLAVIX ) 75 MG tablet Take 1 tablet (75 mg total) by mouth daily.   Continuous Glucose Sensor (DEXCOM G7 SENSOR) MISC Change sensor every 10 days  as directed   cyclobenzaprine  (FLEXERIL ) 5 MG tablet Take 1 tablet (5 mg total) by mouth at bedtime.    Evolocumab  (REPATHA  SURECLICK) 140 MG/ML SOAJ Inject 140 mg into the skin every 14 (fourteen) days.   FARXIGA  10 MG TABS tablet Take 1 tablet (10 mg total) by mouth daily.   furosemide  (LASIX ) 20 MG tablet Take 1 tablet (20 mg total) by mouth daily.   HUMALOG  KWIKPEN 100 UNIT/ML KwikPen Inject 1-20 Units into the skin at bedtime. CBG 70 - 120: 0 units CBG 121 - 150: 3 units CBG 151 - 200: 4 units CBG 201 - 250: 7 units CBG 251 - 300: 11 units CBG 301 - 350: 15 units CBG 351 - 400: 20 units CBG > 400: call MD and obtain STAT lab verification   insulin  glargine, 1 Unit Dial , (TOUJEO  SOLOSTAR) 300 UNIT/ML Solostar Pen Inject 63 Units into the skin daily.   Insulin  Pen Needle (INSUPEN PEN NEEDLES) 32G X 4 MM MISC Use as directed once daily with insulin .   ketoconazole  (NIZORAL ) 2 % cream Apply 1 Application topically daily.   metFORMIN  (GLUCOPHAGE ) 500 MG tablet Take 1 tablet (500 mg total) by mouth 2 (two) times daily with a meal.   metoprolol  succinate (TOPROL -XL) 25 MG 24 hr tablet TAKE 1 TABLET BY MOUTH IN THE MORNING, AT NOON AND AT BEDTIME   nitroGLYCERIN  (NITROSTAT ) 0.4 MG SL tablet Place 1 tablet (0.4 mg total) under the tongue every 5 (five) minutes as needed for chest pain.   nystatin  (MYCOSTATIN /NYSTOP ) powder Apply 1 Application topically daily as needed (rash/yeast).   rosuvastatin  (CRESTOR ) 20 MG tablet Take 1 tablet (20 mg total) by mouth daily.   SANTYL 250 UNIT/GM ointment Apply 1 Application topically daily.   valsartan  (DIOVAN ) 80 MG tablet Take 1 tablet (80 mg total) by mouth 2 (two) times daily.   Vitamin D , Ergocalciferol , (DRISDOL ) 1.25 MG (50000 UNIT) CAPS capsule Take 1 capsule (50,000 Units total) by mouth every 7 (seven) days.   No facility-administered encounter medications on file as of 12/22/2024.    History: Past Medical History:  Diagnosis Date   Abnormal EKG 10/23/2021   Abnormal mammogram 08/09/2023   Abnormal mammogram of left breast 09/04/2021   Abnormal stress  test 10/23/2021   Abscess of bursa of right foot 06/12/2024   Abscess of left elbow 02/18/2024   Abscess of right foot 06/10/2024   Absolute anemia 09/20/2022   Acquired varus deformity of foot, left    Acute chest pain 05/08/2016   Acute hemorrhagic cystitis 08/09/2023   Acute osteomyelitis of right calcaneus (HCC) 06/15/2024   AICD (automatic cardioverter/defibrillator) present    St Jude/Abbott device   AKI (acute kidney injury) 10/23/2021   Amputation of right great toe 09/20/2022   Angina pectoris 09/11/2017   Anxiety 10/23/2021   B12 deficiency 09/20/2022   Bacteremia 11/22/2021   Breast wound, right, subsequent encounter 09/04/2021   Burping 05/08/2016   Cancer (HCC)    cervical - hysterectomy   Cellulitis of right breast 10/23/2021   Cellulitis of right foot 06/15/2024   Cellulitis, wound, post-operative 10/09/2021   Chronic diastolic heart failure (HCC) 11/06/2016   Chronic osteomyelitis of left foot (HCC) 10/16/2023   Colon cancer screening 09/20/2022   Coronary artery disease involving native coronary artery of native heart with angina pectoris 09/12/2015   Overview:  PCI and stent of RCA 2009, last cath 2012 with medical therapy  CABG May 2017   Demand ischemia (HCC)  Diabetes mellitus due to underlying condition with hyperglycemia, with long-term current use of insulin  (HCC) 09/20/2022   Diabetic foot infection (HCC) 11/21/2021   Diabetic foot ulcer (HCC) 02/16/2020   Emphysema lung (HCC) 09/11/2017   patient not aware of this dx   Encounter for preoperative assessment 10/16/2023   Essential hypertension 09/12/2015   Gangrene (HCC) 10/23/2021   GERD (gastroesophageal reflux disease) 05/08/2016   Hematoma 10/23/2021   Hiatal hernia 10/23/2021   History of methicillin resistant staphylococcus aureus (MRSA)    Hyperglycemia 10/23/2021   Hyperglycemia due to type 2 diabetes mellitus (HCC) 10/23/2021   Hyperkalemia    Hyperlipidemia 09/12/2015   Hyponatremia  10/23/2021   ICD (implantable cardioverter-defibrillator) in place 05/16/2021   Infection of great toe 10/23/2021   Intertrigo 09/20/2022   Lactic acidosis 10/23/2021   Leukocytosis 10/23/2021   Mediastinitis 06/28/2016   Morbid obesity (HCC) 05/08/2016   Myocardial infarction (HCC)    Needs flu shot 09/20/2022   NSTEMI (non-ST elevated myocardial infarction) (HCC) 05/22/2020   Obesity (BMI 30-39.9) 10/23/2021   Partial nontraumatic amputation of left foot (HCC) 09/20/2022   Perineal abscess 10/23/2021   Peripheral vascular disease    Right foot infection 06/10/2024   S/P CABG (coronary artery bypass graft) 06/03/2016   Overview:  The patient underwent sternal reconstruction on 06/21/16 with pec flaps for mediastinitis from a prior CABG in May 2017. On admission, she was critically ill from sepsis and had altered mental status. She was last seen in clinic on 08/09/16 at which time she was doing well.   Severe sepsis (HCC) 06/03/2016   Sinus tachycardia 05/08/2016   Tobacco use disorder 04/20/2016   Overview:  Quit in May 2017.   Type 2 diabetes mellitus with foot ulcer (HCC) 05/08/2016   Uncontrolled type 2 diabetes mellitus with hyperglycemia (HCC) 02/18/2024   Unstable angina (HCC) 05/22/2020   Ventricular tachycardia (HCC) 12/17/2020   Vitamin D  deficiency 09/20/2022   Wound, surgical, infected 06/06/2016   Overview:  sternal   Past Surgical History:  Procedure Laterality Date   ABDOMINAL AORTOGRAM W/LOWER EXTREMITY N/A 10/11/2021   Procedure: ABDOMINAL AORTOGRAM W/LOWER EXTREMITY;  Surgeon: Gretta Lonni PARAS, MD;  Location: [facility];  Service: Cardiovascular;  Laterality: N/A;   AMPUTATION TOE Right 02/20/2020   Procedure: AMPUTATION RIGHT GREAT  TOE;  Surgeon: Tobie Franky SQUIBB, DPM;  Location: [facility] OR;  Service: Podiatry;  Laterality: Right;   BLADDER SURGERY     BONE BIOPSY Left 02/01/2022   Procedure: BONE BIOPSY LEFT FOOT, IRRIGATION AND DEBRIDEMENT;  Surgeon: Burt Fus, DPM;  Location: [facility] OR;  Service: Podiatry;  Laterality: Left;   BONE BIOPSY Left 10/31/2023   Procedure: BONE BIOPSY;  Surgeon: Janit Thresa HERO, DPM;  Location: [facility];  Service: Orthopedics/Podiatry;  Laterality: Left;   BUBBLE STUDY  11/27/2021   Procedure: BUBBLE STUDY;  Surgeon: Okey Vina GAILS, MD;  Location: [facility] ENDOSCOPY;  Service: Cardiovascular;;   CARDIAC CATHETERIZATION     CORONARY ARTERY BYPASS GRAFT     CORONARY STENT INTERVENTION N/A 05/25/2020   Procedure: CORONARY STENT INTERVENTION;  Surgeon: Darron Deatrice LABOR, MD;  [facility]: [facility];  Service: Cardiovascular;  Laterality: N/A;   CORONARY STENT INTERVENTION N/A 06/22/2020   Procedure: CORONARY STENT INTERVENTION;  Surgeon: Anner Alm ORN, MD;  [facility] INVASIVE CV LAB;  Service: Cardiovascular;  Laterality: N/A;   CORONARY ULTRASOUND/IVUS N/A 06/22/2020   Procedure: Intravascular Ultrasound/IVUS;  Surgeon: Anner Alm ORN, MD;  Location: [facility]  INVASIVE CV LAB;  Service: Cardiovascular;  Laterality: N/A;   ICD IMPLANT N/A 12/19/2020   Procedure: ICD IMPLANT;  Surgeon: Waddell Danelle ORN, MD;  Location: [facility] INVASIVE CV LAB;  Service: Cardiovascular;  Laterality: N/A;   INCISION AND DRAINAGE Right 02/19/2020   Procedure: INCISION AND DRAINAGE;  Surgeon: Gershon Donnice SAUNDERS, DPM;  Location: [facility] OR;  Service: Podiatry;  Laterality: Right;  Block done by surgeon   INCISION AND DRAINAGE OF WOUND Left 10/31/2023   Procedure: IRRIGATION AND DEBRIDEMENT WOUND;  Surgeon: Janit Thresa HERO, DPM;  Location: [facility]S;  Service: Orthopedics/Podiatry;  Laterality: Left;   IRRIGATION AND DEBRIDEMENT ABSCESS Right 06/12/2024   Procedure: IRRIGATION AND DEBRIDEMENT ABSCESS;  Surgeon: Malvin Marsa FALCON, DPM;  Location: [facility];  Service: Orthopedics/Podiatry;  Laterality: Right;  I&D R heel, possible bone biopsy   IRRIGATION AND DEBRIDEMENT FOOT Left 10/10/2021   Procedure: IRRIGATION AND DEBRIDEMENT FOOT;  Surgeon: Burt Fus, DPM;   Location: [facility];  Service: Podiatry;  Laterality: Left;   IRRIGATION AND DEBRIDEMENT FOOT Left 11/23/2021   Procedure: IRRIGATION AND DEBRIDEMENT FOOT;  Surgeon: Joya Stabs, MD;  Location: [facility];  Service: Podiatry;  Laterality: Left;  will do local block   LEFT HEART CATH AND CORS/GRAFTS ANGIOGRAPHY N/A 05/23/2020   Procedure: LEFT HEART CATH AND CORS/GRAFTS ANGIOGRAPHY;  Surgeon: Mady Bruckner, MD;  [facility]: [facility];  Service: Cardiovascular;  Laterality: N/A;   LEFT HEART CATH AND CORS/GRAFTS ANGIOGRAPHY N/A 12/19/2020   Procedure: LEFT HEART CATH AND CORS/GRAFTS ANGIOGRAPHY;  Surgeon: Verlin Bruckner BIRCH, MD;  [facility]: [facility];  Service: Cardiovascular;  Laterality: N/A;   LEFT HEART CATH AND CORS/GRAFTS ANGIOGRAPHY N/A 07/21/2021   Procedure: LEFT HEART CATH AND CORS/GRAFTS ANGIOGRAPHY;  Surgeon: Mady Bruckner, MD;  [facility]: [facility];  Service: Cardiovascular;  Laterality: N/A;   METATARSAL HEAD EXCISION Right 05/02/2020   Procedure: FIRST METATARSAL HEAD RESECTION; RIGHT FOOT WOUND CLOSURE;  Surgeon: Burt Fus, DPM;  Location: [facility];  Service: Podiatry;  Laterality: Right;  MAC W/LOCAL   PERIPHERAL VASCULAR BALLOON ANGIOPLASTY Left 10/11/2021   Procedure: PERIPHERAL VASCULAR BALLOON ANGIOPLASTY;  Surgeon: Gretta Bruckner PARAS, MD;  [facility];  Service: Cardiovascular;  Laterality: Left;  Anterior Tibial Artery   TEE WITHOUT CARDIOVERSION N/A 11/27/2021   Procedure: TRANSESOPHAGEAL ECHOCARDIOGRAM (TEE);  Surgeon: Okey Vina GAILS, MD;  Location: [facility] ENDOSCOPY;  Service: Cardiovascular;  Laterality: N/A;   TIBIALIS TENDON TRANSFER / REPAIR Left 12/06/2023   Procedure: TIBIALIS TENDON TRANSFER;  Surgeon: Janit Thresa HERO, DPM;  Location: [facility] ORS;  Service: Orthopedics/Podiatry;  Laterality: Left;   TRANSMETATARSAL AMPUTATION Left 11/23/2021   Procedure: TRANSMETATARSAL AMPUTATION;  Surgeon: Joya Stabs, MD;   Location: [facility] OR;  Service: Podiatry;  Laterality: Left;   TUBAL LIGATION     Family History  Problem Relation Age of Onset   Hypertension Mother    Hyperlipidemia Mother    Heart attack Father    Heart disease Father    Hypertension Father    Alzheimer's disease Father    Hypertension Brother    Hyperlipidemia Brother    Social History   Occupational History   Not on file  Tobacco Use   Smoking status: Former    Current packs/day: 0.00    Types: Cigarettes    Quit date: 05/2016    Years since quitting: 8.6   Smokeless tobacco: Never  Vaping Use   Vaping status: Never Used  Substance and  Sexual Activity   Alcohol use: Yes    Comment: rare   Drug use: No   Sexual activity: Yes    Birth control/protection: Surgical    Comment: Hysterectomy   Tobacco Counseling Counseling given: Not Answered  SDOH Screenings   Food Insecurity: No Food Insecurity (12/22/2024)  Housing: Low Risk (12/22/2024)  Transportation Needs: No Transportation Needs (12/22/2024)  Utilities: Not At Risk (12/22/2024)  Alcohol Screen: Low Risk (10/16/2023)  Depression (PHQ2-9): Low Risk (12/22/2024)  Financial Resource Strain: Low Risk (10/16/2023)  Physical Activity: Insufficiently Active (12/22/2024)  Social Connections: Socially Integrated (12/22/2024)  Stress: No Stress Concern Present (12/22/2024)  Tobacco Use: Medium Risk (12/22/2024)  Health Literacy: Adequate Health Literacy (12/22/2024)   See flowsheets for full screening details  Depression Screen PHQ 2 & 9 Depression Scale- Over the past 2 weeks, how often have you been bothered by any of the following problems? Little interest or pleasure in doing things: 0 Feeling down, depressed, or hopeless (PHQ Adolescent also includes...irritable): 0 PHQ-2 Total Score: 0 Trouble falling or staying asleep, or sleeping too much: 0 Feeling tired or having little energy: 0 Poor appetite or overeating (PHQ Adolescent also includes...weight loss): 0 Feeling bad about  yourself - or that you are a failure or have let yourself or your family down: 0 Trouble concentrating on things, such as reading the newspaper or watching television (PHQ Adolescent also includes...like school work): 0 Moving or speaking so slowly that other people could have noticed. Or the opposite - being so fidgety or restless that you have been moving around a lot more than usual: 0 Thoughts that you would be better off dead, or of hurting yourself in some way: 0 PHQ-9 Total Score: 0 If you checked off any problems, how difficult have these problems made it for you to do your work, take care of things at home, or get along with other people?: Not difficult at all  Depression Treatment Depression Interventions/Treatment : EYV7-0 Score <4 Follow-up Not Indicated     Goals Addressed               This Visit's Progress     Patient Stated (pt-stated)        Patient states to travel more.             Objective:    Today's Vitals   12/22/24 1309  BP: 124/84  Weight: 213 lb 9.6 oz (96.9 kg)  Height: 5' 7 (1.702 m)   Body mass index is 33.45 kg/m.  Hearing/Vision screen Hearing Screening - Comments:: No difficulties hearing at all. Vision Screening - Comments:: Wears eye classes. Sees Trudy Finder in [place] , [place]  Immunizations and Health Maintenance Health Maintenance  Topic Date Due   Medicare Annual Wellness (AWV)  Never done   Zoster Vaccines- Shingrix (2 of 2) 02/10/2025 (Originally 09/08/2023)   Cervical Cancer Screening (HPV/Pap Cotest)  03/13/2025 (Originally 09/25/1990)   Colonoscopy  03/13/2025 (Originally 12/05/2018)   Influenza Vaccine  03/16/2025 (Originally 07/17/2024)   COVID-19 Vaccine (3 - Pfizer risk series) 03/29/2025 (Originally 10/31/2020)   Pneumococcal Vaccine: 50+ Years (1 of 2 - PCV) 07/22/2025 (Originally 09/26/1979)   Diabetic kidney evaluation - Urine ACR  03/13/2025   FOOT EXAM  03/13/2025   OPHTHALMOLOGY EXAM  03/16/2025   HEMOGLOBIN  A1C  05/10/2025   Diabetic kidney evaluation - eGFR measurement  11/25/2025   Mammogram  12/02/2025   DTaP/Tdap/Td (2 - Td or Tdap) 11/28/2028   HIV Screening  Completed  Hepatitis B Vaccines 19-59 Average Risk  Aged Out   HPV VACCINES  Aged Out   Meningococcal B Vaccine  Aged Out   Hepatitis C Screening  Discontinued        Assessment/Plan:  This is a routine wellness examination for [facility].  Patient Care Team: Nicholaus Credit, PA-C as PCP - General (Physician Assistant) Monetta Redell PARAS, MD as PCP - Cardiology (Cardiology) Vannie Elsie HERO, OD (Ophthalmology) Janit Thresa HERO, DPM as Consulting Physician (Podiatry)  I have personally reviewed and noted the following in the patients chart:   Medical and social history Use of alcohol, tobacco or illicit drugs  Current medications and supplements including opioid prescriptions. Functional ability and status Nutritional status Physical activity Advanced directives List of other physicians Hospitalizations, surgeries, and ER visits in previous 12 months Vitals Screenings to include cognitive, depression, and falls Referrals and appointments  No orders of the defined types were placed in this encounter.  In addition, I have reviewed and discussed with patient certain preventive protocols, quality metrics, and best practice recommendations. A written personalized care plan for preventive services as well as general preventive health recommendations were provided to patient.   Lyle MARLA Right, [place]   12/22/2024   No follow-ups on file.  After Visit Summary: (In Person-Declined) Patient declined AVS at this time.  No voiced or noted concerns at this time  "

## 2024-12-22 NOTE — Patient Instructions (Signed)
 Jasmine Richardson,  Thank you for taking the time for your Medicare Wellness Visit. I appreciate your continued commitment to your health goals. Please review the care plan we discussed, and feel free to reach out if I can assist you further.  Please note that Annual Wellness Visits do not include a physical exam. Some assessments may be limited, especially if the visit was conducted virtually. If needed, we may recommend an in-person follow-up with your provider.  Ongoing Care Seeing your primary care provider every 3 to 6 months helps us  monitor your health and provide consistent, personalized care.   Referrals If a referral was made during today's visit and you haven't received any updates within two weeks, please contact the referred provider directly to check on the status.  Recommended Screenings:  Health Maintenance  Topic Date Due   Medicare Annual Wellness Visit  Never done   Eye exam for diabetics  11/06/2024   Zoster (Shingles) Vaccine (2 of 2) 02/10/2025*   Pap with HPV screening  03/13/2025*   Colon Cancer Screening  03/13/2025*   Flu Shot  03/16/2025*   COVID-19 Vaccine (3 - Pfizer risk series) 03/29/2025*   Pneumococcal Vaccine for age over 64 (1 of 2 - PCV) 07/22/2025*   Yearly kidney health urinalysis for diabetes  03/13/2025   Complete foot exam   03/13/2025   Hemoglobin A1C  05/10/2025   Yearly kidney function blood test for diabetes  11/25/2025   Breast Cancer Screening  12/02/2025   DTaP/Tdap/Td vaccine (2 - Td or Tdap) 11/28/2028   HIV Screening  Completed   Hepatitis B Vaccine  Aged Out   HPV Vaccine  Aged Out   Meningitis B Vaccine  Aged Out   Hepatitis C Screening  Discontinued  *Topic was postponed. The date shown is not the original due date.       12/22/2024    1:12 PM  Advanced Directives  Does Patient Have a Medical Advance Directive? No  Would patient like information on creating a medical advance directive? No - Patient declined    Vision: Annual  vision screenings are recommended for early detection of glaucoma, cataracts, and diabetic retinopathy. These exams can also reveal signs of chronic conditions such as diabetes and high blood pressure.  Dental: Annual dental screenings help detect early signs of oral cancer, gum disease, and other conditions linked to overall health, including heart disease and diabetes.  Please see the attached documents for additional preventive care recommendations.

## 2024-12-30 ENCOUNTER — Other Ambulatory Visit: Payer: Self-pay

## 2024-12-30 ENCOUNTER — Other Ambulatory Visit (HOSPITAL_BASED_OUTPATIENT_CLINIC_OR_DEPARTMENT_OTHER): Payer: Self-pay

## 2024-12-30 ENCOUNTER — Other Ambulatory Visit: Payer: Self-pay | Admitting: Cardiology

## 2024-12-30 MED ORDER — AMIODARONE HCL 200 MG PO TABS
200.0000 mg | ORAL_TABLET | Freq: Every day | ORAL | 1 refills | Status: AC
  Filled 2024-12-30: qty 90, 90d supply, fill #0

## 2024-12-31 ENCOUNTER — Other Ambulatory Visit (HOSPITAL_BASED_OUTPATIENT_CLINIC_OR_DEPARTMENT_OTHER): Payer: Self-pay

## 2025-02-15 ENCOUNTER — Ambulatory Visit: Admitting: Physician Assistant

## 2025-02-15 ENCOUNTER — Ambulatory Visit

## 2025-05-17 ENCOUNTER — Ambulatory Visit
